# Patient Record
Sex: Male | Born: 1955 | Race: White | Hispanic: No | Marital: Single | Smoking: Former smoker
Health system: Southern US, Community
[De-identification: ages and names within clinical notes are randomized; demographics above are authoritative.]

## PROBLEM LIST (undated history)

## (undated) DIAGNOSIS — E039 Hypothyroidism, unspecified: Secondary | ICD-10-CM

## (undated) DIAGNOSIS — I739 Peripheral vascular disease, unspecified: Secondary | ICD-10-CM

## (undated) DIAGNOSIS — F04 Amnestic disorder due to known physiological condition: Secondary | ICD-10-CM

## (undated) DIAGNOSIS — M797 Fibromyalgia: Secondary | ICD-10-CM

## (undated) DIAGNOSIS — K7460 Unspecified cirrhosis of liver: Secondary | ICD-10-CM

## (undated) DIAGNOSIS — F329 Major depressive disorder, single episode, unspecified: Secondary | ICD-10-CM

## (undated) DIAGNOSIS — D696 Thrombocytopenia, unspecified: Secondary | ICD-10-CM

## (undated) DIAGNOSIS — N189 Chronic kidney disease, unspecified: Secondary | ICD-10-CM

## (undated) DIAGNOSIS — E1140 Type 2 diabetes mellitus with diabetic neuropathy, unspecified: Secondary | ICD-10-CM

## (undated) DIAGNOSIS — F32A Depression, unspecified: Secondary | ICD-10-CM

## (undated) DIAGNOSIS — F29 Unspecified psychosis not due to a substance or known physiological condition: Secondary | ICD-10-CM

## (undated) DIAGNOSIS — R569 Unspecified convulsions: Secondary | ICD-10-CM

## (undated) DIAGNOSIS — F1010 Alcohol abuse, uncomplicated: Secondary | ICD-10-CM

## (undated) DIAGNOSIS — F419 Anxiety disorder, unspecified: Secondary | ICD-10-CM

## (undated) DIAGNOSIS — M1990 Unspecified osteoarthritis, unspecified site: Secondary | ICD-10-CM

## (undated) DIAGNOSIS — D689 Coagulation defect, unspecified: Secondary | ICD-10-CM

## (undated) DIAGNOSIS — E669 Obesity, unspecified: Secondary | ICD-10-CM

## (undated) DIAGNOSIS — E871 Hypo-osmolality and hyponatremia: Secondary | ICD-10-CM

## (undated) DIAGNOSIS — I8500 Esophageal varices without bleeding: Secondary | ICD-10-CM

## (undated) DIAGNOSIS — K7682 Hepatic encephalopathy: Secondary | ICD-10-CM

## (undated) DIAGNOSIS — Z87898 Personal history of other specified conditions: Secondary | ICD-10-CM

## (undated) DIAGNOSIS — K219 Gastro-esophageal reflux disease without esophagitis: Secondary | ICD-10-CM

## (undated) DIAGNOSIS — F319 Bipolar disorder, unspecified: Secondary | ICD-10-CM

## (undated) DIAGNOSIS — B1920 Unspecified viral hepatitis C without hepatic coma: Secondary | ICD-10-CM

## (undated) DIAGNOSIS — K439 Ventral hernia without obstruction or gangrene: Secondary | ICD-10-CM

## (undated) DIAGNOSIS — IMO0002 Reserved for concepts with insufficient information to code with codable children: Secondary | ICD-10-CM

## (undated) DIAGNOSIS — S32030A Wedge compression fracture of third lumbar vertebra, initial encounter for closed fracture: Secondary | ICD-10-CM

## (undated) DIAGNOSIS — K7290 Hepatic failure, unspecified without coma: Secondary | ICD-10-CM

## (undated) DIAGNOSIS — Z87448 Personal history of other diseases of urinary system: Secondary | ICD-10-CM

## (undated) HISTORY — PX: OTHER SURGICAL HISTORY: SHX169

## (undated) HISTORY — DX: Type 2 diabetes mellitus with diabetic neuropathy, unspecified: E11.40

## (undated) HISTORY — DX: Reserved for concepts with insufficient information to code with codable children: IMO0002

## (undated) HISTORY — PX: COLOSTOMY: SHX63

## (undated) HISTORY — PX: CHOLECYSTECTOMY: SHX55

## (undated) HISTORY — DX: Unspecified osteoarthritis, unspecified site: M19.90

## (undated) HISTORY — DX: Chronic kidney disease, unspecified: N18.9

## (undated) HISTORY — DX: Unspecified convulsions: R56.9

---

## 2000-10-16 ENCOUNTER — Encounter: Payer: Self-pay | Admitting: Emergency Medicine

## 2000-10-16 ENCOUNTER — Emergency Department (HOSPITAL_COMMUNITY): Admission: EM | Admit: 2000-10-16 | Discharge: 2000-10-16 | Payer: Self-pay | Admitting: Emergency Medicine

## 2007-07-27 ENCOUNTER — Inpatient Hospital Stay (HOSPITAL_COMMUNITY): Admission: EM | Admit: 2007-07-27 | Discharge: 2007-08-05 | Payer: Self-pay | Admitting: Emergency Medicine

## 2007-10-30 ENCOUNTER — Ambulatory Visit: Payer: Self-pay | Admitting: Vascular Surgery

## 2007-10-30 ENCOUNTER — Inpatient Hospital Stay (HOSPITAL_COMMUNITY): Admission: EM | Admit: 2007-10-30 | Discharge: 2007-11-03 | Payer: Self-pay | Admitting: Emergency Medicine

## 2007-10-31 ENCOUNTER — Encounter (INDEPENDENT_AMBULATORY_CARE_PROVIDER_SITE_OTHER): Payer: Self-pay | Admitting: Internal Medicine

## 2009-05-26 ENCOUNTER — Emergency Department (HOSPITAL_COMMUNITY): Admission: EM | Admit: 2009-05-26 | Discharge: 2009-05-27 | Payer: Self-pay | Admitting: Emergency Medicine

## 2009-08-19 ENCOUNTER — Encounter (INDEPENDENT_AMBULATORY_CARE_PROVIDER_SITE_OTHER): Payer: Self-pay | Admitting: *Deleted

## 2009-10-04 ENCOUNTER — Ambulatory Visit: Payer: Self-pay | Admitting: Gastroenterology

## 2009-10-23 ENCOUNTER — Inpatient Hospital Stay (HOSPITAL_COMMUNITY): Admission: EM | Admit: 2009-10-23 | Discharge: 2009-10-27 | Payer: Self-pay | Admitting: Emergency Medicine

## 2009-12-01 ENCOUNTER — Encounter (INDEPENDENT_AMBULATORY_CARE_PROVIDER_SITE_OTHER): Payer: Self-pay | Admitting: *Deleted

## 2010-01-10 ENCOUNTER — Telehealth (INDEPENDENT_AMBULATORY_CARE_PROVIDER_SITE_OTHER): Payer: Self-pay | Admitting: *Deleted

## 2010-01-10 ENCOUNTER — Telehealth: Payer: Self-pay | Admitting: Gastroenterology

## 2010-02-26 DIAGNOSIS — K439 Ventral hernia without obstruction or gangrene: Secondary | ICD-10-CM

## 2010-02-26 DIAGNOSIS — Z87898 Personal history of other specified conditions: Secondary | ICD-10-CM

## 2010-02-26 HISTORY — DX: Personal history of other specified conditions: Z87.898

## 2010-02-26 HISTORY — DX: Ventral hernia without obstruction or gangrene: K43.9

## 2010-03-28 NOTE — Letter (Signed)
Summary: New Patient letter  [facility]  [address] [place], [place] [address]   Phone: [phone]  Fax: [phone]       08/19/2009 MRN: [id number]  Tony Boyd [address] [place],  [address]  Dear Mr. Tramontana,  Welcome to the Gastroenterology Division at [facility].    You are scheduled to see Dr.  Arlyce Dice  on 10/04/2009 at 9:00am on the 3rd floor at [facility], [address].  We ask that you try to arrive at our office 15 minutes prior to your appointment time to allow for check-in.  We would like you to complete the enclosed self-administered evaluation form prior to your visit and bring it with you on the day of your appointment.  We will review it with you.  Also, please bring a complete list of all your medications or, if you prefer, bring the medication bottles and we will list them.  Please bring your insurance card so that we may make a copy of it.  If your insurance requires a referral to see a specialist, please bring your referral form from your primary care physician.  Co-payments are due at the time of your visit and may be paid by cash, check or credit card.     Your office visit will consist of a consult with your physician (includes a physical exam), any laboratory testing he/she may order, scheduling of any necessary diagnostic testing (e.g. x-ray, ultrasound, CT-scan), and scheduling of a procedure (e.g. Endoscopy, Colonoscopy) if required.  Please allow enough time on your schedule to allow for any/all of these possibilities.    If you cannot keep your appointment, please call [phone] to cancel or reschedule prior to your appointment date.  This allows [place] the opportunity to schedule an appointment for another patient in need of care.  If you do not cancel or reschedule by 5 p.m. the business day prior to your appointment date, you will be charged a $50.00 late cancellation/no-show fee.    Thank you for choosing  [facility] for your medical needs.  We appreciate the opportunity to care for you.  Please visit [place] at our website  to learn more about our practice.                     Sincerely,                                                             The Gastroenterology Division

## 2010-03-28 NOTE — Progress Notes (Signed)
Summary: appt cancelled  Phone Note Outgoing Call Call back at [facility] Phone [phone]   Call placed by: Vallarie Mare,  January 10, 2010 1:54 PM Call placed to: Patient Summary of Call: left mesg on vm stating that 01/13/2010 appt has been cancelled due to appt history with Dr. Arlyce Dice... if any questions can call back at [phone]... then I cancelled the appt Initial call taken by: Vallarie Mare,  January 10, 2010 1:55 PM

## 2010-03-28 NOTE — Letter (Signed)
Summary: New Patient letter  [facility]  [address] [place], [place] [address]   Phone: [phone]  Fax: [phone]       12/01/2009 MRN: [id number]  Tony Boyd [address] [place], [place]  [address]  Dear Tony Boyd,  Welcome to the Gastroenterology Division at [facility].    You are scheduled to see Dr. Christella Hartigan on 01/13/2010 at 1:30PM on the 3rd floor at [facility], [address].  We ask that you try to arrive at our office 15 minutes prior to your appointment time to allow for check-in.  We would like you to complete the enclosed self-administered evaluation form prior to your visit and bring it with you on the day of your appointment.  We will review it with you.  Also, please bring a complete list of all your medications or, if you prefer, bring the medication bottles and we will list them.  Please bring your insurance card so that we may make a copy of it.  If your insurance requires a referral to see a specialist, please bring your referral form from your primary care physician.  Co-payments are due at the time of your visit and may be paid by cash, check or credit card.     Your office visit will consist of a consult with your physician (includes a physical exam), any laboratory testing he/she may order, scheduling of any necessary diagnostic testing (e.g. x-ray, ultrasound, CT-scan), and scheduling of a procedure (e.g. Endoscopy, Colonoscopy) if required.  Please allow enough time on your schedule to allow for any/all of these possibilities.    If you cannot keep your appointment, please call [phone] to cancel or reschedule prior to your appointment date.  This allows [place] the opportunity to schedule an appointment for another patient in need of care.  If you do not cancel or reschedule by 5 p.m. the business day prior to your appointment date, you will be charged a $50.00 late cancellation/no-show fee.    Thank you for choosing [facility] for your medical needs.  We appreciate the opportunity to care for you.  Please visit [place] at our website  to learn more about our practice.                     Sincerely,                                                             The Gastroenterology Division

## 2010-03-28 NOTE — Progress Notes (Signed)
Summary: Cx appt  ---- Converted from flag ---- ---- 01/10/2010 10:46 AM, Chales Abrahams CMA (AAMA) wrote:  Revonda Standard can you please cx this appt and let the pt know, Dr Christella Hartigan will not accept.  Thanks  ---- 01/09/2010 6:49 PM, Rachael Fee MD wrote: no, thanks  ---- 01/09/2010 10:16 AM, Chales Abrahams CMA Duncan Dull) wrote: Dr Christella Hartigan this pt no showed for Dr Arlyce Dice 2 times and they put him on your schedule.  Do you want to leave it on ? ------------------------------

## 2010-05-11 LAB — CBC
HCT: 35.6 % — ABNORMAL LOW (ref 39.0–52.0)
HCT: 31.1 % — ABNORMAL LOW (ref 39.0–52.0)
HCT: 33.9 % — ABNORMAL LOW (ref 39.0–52.0)
HCT: 34.4 % — ABNORMAL LOW (ref 39.0–52.0)
Hemoglobin: 12.1 g/dL — ABNORMAL LOW (ref 13.0–17.0)
Hemoglobin: 11.6 g/dL — ABNORMAL LOW (ref 13.0–17.0)
Hemoglobin: 11.8 g/dL — ABNORMAL LOW (ref 13.0–17.0)
MCH: 37.1 pg — ABNORMAL HIGH (ref 26.0–34.0)
MCH: 37.5 pg — ABNORMAL HIGH (ref 26.0–34.0)
MCH: 38.5 pg — ABNORMAL HIGH (ref 26.0–34.0)
MCHC: 34 g/dL (ref 30.0–36.0)
MCHC: 33.9 g/dL (ref 30.0–36.0)
MCHC: 34.3 g/dL (ref 30.0–36.0)
MCHC: 34.4 g/dL (ref 30.0–36.0)
MCV: 109.2 fL — ABNORMAL HIGH (ref 78.0–100.0)
MCV: 109.7 fL — ABNORMAL HIGH (ref 78.0–100.0)
MCV: 110.7 fL — ABNORMAL HIGH (ref 78.0–100.0)
Platelets: 42 10*3/uL — ABNORMAL LOW (ref 150–400)
Platelets: 43 10*3/uL — ABNORMAL LOW (ref 150–400)
RBC: 3.26 MIL/uL — ABNORMAL LOW (ref 4.22–5.81)
RBC: 3.09 MIL/uL — ABNORMAL LOW (ref 4.22–5.81)
RDW: 15.1 % (ref 11.5–15.5)
RDW: 15.2 % (ref 11.5–15.5)
RDW: 15.5 % (ref 11.5–15.5)
WBC: 4.3 10*3/uL (ref 4.0–10.5)
WBC: 4.5 10*3/uL (ref 4.0–10.5)
WBC: 9 10*3/uL (ref 4.0–10.5)

## 2010-05-11 LAB — DIFFERENTIAL
Basophils Absolute: 0 10*3/uL (ref 0.0–0.1)
Basophils Relative: 0 % (ref 0–1)
Eosinophils Absolute: 0.2 10*3/uL (ref 0.0–0.7)
Eosinophils Relative: 0 % (ref 0–5)
Lymphocytes Relative: 8 % — ABNORMAL LOW (ref 12–46)
Monocytes Absolute: 0.5 10*3/uL (ref 0.1–1.0)
Neutro Abs: 7.8 10*3/uL — ABNORMAL HIGH (ref 1.7–7.7)
Neutrophils Relative %: 59 % (ref 43–77)
Neutrophils Relative %: 87 % — ABNORMAL HIGH (ref 43–77)

## 2010-05-11 LAB — TROPONIN I: Troponin I: 0.02 ng/mL (ref 0.00–0.06)

## 2010-05-11 LAB — CULTURE, BLOOD (ROUTINE X 2): Culture: NO GROWTH

## 2010-05-11 LAB — HEPATIC FUNCTION PANEL
ALT: 29 U/L (ref 0–53)
AST: 80 U/L — ABNORMAL HIGH (ref 0–37)
Albumin: 2 g/dL — ABNORMAL LOW (ref 3.5–5.2)
Bilirubin, Direct: 1.6 mg/dL — ABNORMAL HIGH (ref 0.0–0.3)
Total Bilirubin: 4 mg/dL — ABNORMAL HIGH (ref 0.3–1.2)
Total Protein: 8 g/dL (ref 6.0–8.3)

## 2010-05-11 LAB — COMPREHENSIVE METABOLIC PANEL
ALT: 25 U/L (ref 0–53)
ALT: 26 U/L (ref 0–53)
AST: 68 U/L — ABNORMAL HIGH (ref 0–37)
Alkaline Phosphatase: 96 U/L (ref 39–117)
BUN: 9 mg/dL (ref 6–23)
CO2: 30 mEq/L (ref 19–32)
CO2: 27 mEq/L (ref 19–32)
CO2: 27 mEq/L (ref 19–32)
Calcium: 7.5 mg/dL — ABNORMAL LOW (ref 8.4–10.5)
Calcium: 7.7 mg/dL — ABNORMAL LOW (ref 8.4–10.5)
Chloride: 102 mEq/L (ref 96–112)
Creatinine, Ser: 0.75 mg/dL (ref 0.4–1.5)
GFR calc Af Amer: >60 mL/min (ref >60)
GFR calc Af Amer: >60 mL/min (ref >60)
GFR calc non Af Amer: >60 mL/min (ref >60)
GFR calc non Af Amer: >60 mL/min (ref >60)
Glucose, Bld: 117 mg/dL — ABNORMAL HIGH (ref 70–99)
Glucose, Bld: 139 mg/dL — ABNORMAL HIGH (ref 70–99)
Potassium: 3.7 mEq/L (ref 3.5–5.1)
Sodium: 131 mEq/L — ABNORMAL LOW (ref 135–145)
Sodium: 132 mEq/L — ABNORMAL LOW (ref 135–145)
Sodium: 132 mEq/L — ABNORMAL LOW (ref 135–145)
Total Bilirubin: 2.8 mg/dL — ABNORMAL HIGH (ref 0.3–1.2)
Total Protein: 7.9 g/dL (ref 6.0–8.3)
Total Protein: 6.6 g/dL (ref 6.0–8.3)

## 2010-05-11 LAB — URINE CULTURE: Colony Count: 2000

## 2010-05-11 LAB — MRSA PCR SCREENING: MRSA by PCR: NEGATIVE

## 2010-05-11 LAB — PHOSPHORUS: Phosphorus: 4.3 mg/dL (ref 2.3–4.6)

## 2010-05-11 LAB — URINALYSIS, ROUTINE W REFLEX MICROSCOPIC
Hgb urine dipstick: NEGATIVE
Urobilinogen, UA: 1 mg/dL (ref 0.0–1.0)
pH: 5 (ref 5.0–8.0)

## 2010-05-11 LAB — CK TOTAL AND CKMB (NOT AT ARMC)
CK, MB: 2.9 ng/mL (ref 0.3–4.0)
Relative Index: 1.2 (ref 0.0–2.5)
Total CK: 250 U/L — ABNORMAL HIGH (ref 7–232)

## 2010-05-11 LAB — POCT CARDIAC MARKERS: Troponin i, poc: <0.05 ng/mL (ref 0.00–0.09)

## 2010-05-11 LAB — POCT I-STAT, CHEM 8
Calcium, Ion: 0.9 mmol/L — ABNORMAL LOW (ref 1.12–1.32)
Potassium: 5.4 mEq/L — ABNORMAL HIGH (ref 3.5–5.1)
TCO2: 21 mmol/L (ref 0–100)

## 2010-05-11 LAB — GLUCOSE, CAPILLARY

## 2010-05-11 LAB — URINE MICROSCOPIC-ADD ON

## 2010-05-11 LAB — AMMONIA
Ammonia: 151 umol/L — ABNORMAL HIGH (ref 11–35)
Ammonia: 69 umol/L — ABNORMAL HIGH (ref 11–35)

## 2010-05-11 LAB — MAGNESIUM: Magnesium: 1.8 mg/dL (ref 1.5–2.5)

## 2010-05-11 LAB — LACTIC ACID, PLASMA: Lactic Acid, Venous: 3 mmol/L — ABNORMAL HIGH (ref 0.5–2.2)

## 2010-05-21 LAB — DIFFERENTIAL
Basophils Relative: 0 % (ref 0–1)
Eosinophils Relative: 6 % — ABNORMAL HIGH (ref 0–5)
Lymphocytes Relative: 28 % (ref 12–46)
Monocytes Absolute: 0.5 10*3/uL (ref 0.1–1.0)
Monocytes Relative: 11 % (ref 3–12)
Neutrophils Relative %: 55 % (ref 43–77)

## 2010-05-21 LAB — RAPID URINE DRUG SCREEN, HOSP PERFORMED
Benzodiazepines: POSITIVE — AB
Cocaine: NOT DETECTED
Opiates: NOT DETECTED
Tetrahydrocannabinol: NOT DETECTED

## 2010-05-21 LAB — URINALYSIS, ROUTINE W REFLEX MICROSCOPIC
Glucose, UA: NEGATIVE mg/dL
Hgb urine dipstick: NEGATIVE
Ketones, ur: 15 mg/dL — AB
Specific Gravity, Urine: 1.019 (ref 1.005–1.030)
pH: 6 (ref 5.0–8.0)

## 2010-05-21 LAB — POCT I-STAT, CHEM 8
BUN: <3 mg/dL — ABNORMAL LOW (ref 6–23)
Calcium, Ion: 0.99 mmol/L — ABNORMAL LOW (ref 1.12–1.32)
Creatinine, Ser: 1.1 mg/dL (ref 0.4–1.5)
Hemoglobin: 12.6 g/dL — ABNORMAL LOW (ref 13.0–17.0)
Sodium: 141 mEq/L (ref 135–145)
TCO2: 28 mmol/L (ref 0–100)

## 2010-05-21 LAB — CBC
MCV: 114.2 fL — ABNORMAL HIGH (ref 78.0–100.0)
RBC: 3.08 MIL/uL — ABNORMAL LOW (ref 4.22–5.81)
WBC: 4.4 10*3/uL (ref 4.0–10.5)

## 2010-05-21 LAB — HEPATIC FUNCTION PANEL
ALT: 29 U/L (ref 0–53)
AST: 90 U/L — ABNORMAL HIGH (ref 0–37)
Albumin: 1.8 g/dL — ABNORMAL LOW (ref 3.5–5.2)
Alkaline Phosphatase: 104 U/L (ref 39–117)
Total Protein: 8.2 g/dL (ref 6.0–8.3)

## 2010-05-21 LAB — ETHANOL: Alcohol, Ethyl (B): 277 mg/dL — ABNORMAL HIGH (ref 0–10)

## 2010-07-11 NOTE — Discharge Summary (Signed)
NAME:  Tony Boyd, Tony Boyd NO.:  [id number]   MEDICAL RECORD NO.:  [id number]          PATIENT TYPE:  INP   LOCATION:  1511                         FACILITY:  [facility]   PHYSICIAN:  Marcellus Scott, MD     DATE OF BIRTH:  [birth year]   DATE OF ADMISSION:  07/27/2007  DATE OF DISCHARGE:  08/01/2007                               DISCHARGE SUMMARY   PRIMARY MEDICAL DOCTOR:  Gentry Fitz.   DISCHARGE DIAGNOSES:  1. Delirium - resolved.  2. Hypokalemia - repleted.  3. Hypomagnesemia - repleted.  4. Mild hyponatremia, chronic.  5. End-stage liver disease/hepatitis C.  6. Coagulopathy secondary to problem # 5.  No bleeding.  7. Anemia - stable.  8. Chronic thrombocytopenia.  9. History of alcohol abuse.   CODE STATUS:  DNR.   DISCHARGE MEDICATIONS:  1. Roxanol 20 mg per mL solution, 10 mg p.o. q.6 h.  2. Lactulose 60 mL p.o. q.6 h.  3. Aldactone 50 mg p.o. daily.  4. Haldol 0.5 mg p.o. q.6 h.  5. Omeprazole 40 mg p.o. b.i.d.  6. Ativan 1 mg p.o. t.i.d.- for 1 week then taper by 1mg  per week to      zero.  7. Multivitamins with minerals 1 p.o. daily.  8. Thiamine 100 mg p.o. daily.  9. Roxanol 20 mg per mL solution, 5 mg p.o. q.2 h. p.r.n.  10.Ativan 1 mg p.o. q.8 h. p.r.n.  11.Mylanta 30-60 mL p.o. q.4 h. p.r.n.   DIAGNOSTICS:  1. On Jul 27, 2007 portable chest x-ray impression:  Patchy left      basilar opacity, may reflect atelectasis, although aspiration is      not excluded in this context.  There may be a small left pleural      effusion.  2. Ultrasound of the abdomen on July 28, 2007 impression:  No evidence      for significant ascites.  3. CT of the head without contrast impression:  No acute intracranial      process.   LABORATORY DATA:  Pertinent labs:  Magnesium 1.9.  Basic metabolic  panel:  Sodium 133 otherwise unremarkable.  BUN 3, creatinine 0.51.  CBCs with hemoglobin 10.7, hematocrit 32, white blood cell 4.5,  platelets 71, ammonia level 17,  lipase level  45, blood alcohol level  less than 5, INR of 1.9.   CONSULTATIONS:  Palliative: Dr. Karilyn Cota.   HOSPITAL COURSE/DISPOSITION:  Please refer to the history and physical  note for initial admission details.  In summary, Tony Boyd is a 55-year-  old gentleman with history of end-stage liver disease on home Hospice  who was brought from home with history of altered mental status and  agitation since the day before admission.  He has had 2 recent prolonged  admissions at [facility].  He then had ascites which was tapped.  He also had hematemesis and had been endoscoped by the  gastroenterologist.  Since then, he has been on Hospice.  Apparently he  had been improving, including his mental status,  for the week prior  until the day prior to this admission.  In the ED, he got Geodon.  He  complained of some abdominal discomfort.  He was evaluated in the  emergency room and admitted with altered mental status and  agitation/delirium.   1. Delirium/altered mental status.  There were multiple differential      diagnoses which were entertained including hepatic encephalopathy,      delirium tremens and medications.  He was admitted to the hospital.      CT of the head was negative.  He was continued on his lactulose.      His ammonia levels were normal.  Suspecting an infectious process,      the patient had a chest x-ray and paracentesis was requested to      rule out SBE.  His chest x-ray nor his presentation was suggestive      of pneumonia whereby his Avelox, which was empirically started, was      discontinued.  Interventional radiology indicated that there was no      fluid to tap in the belly.  The patient also was on benzodiazepines      high-dose which had been started since Jul 17, 2007 at [facility].  These are being slowly tapered.  The patient was placed      on Haldol.  With these measures, the patient has progressively done      quite well.  He is  now at most times coherent and able to hold a      descent conversation, but there are periods intermittently where he      seems confused and may be even confabulates.  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 Patient has not been alcohol      for a couple of weeks now.  2. Hypokalemia - repleted.  3. Hypomagnesemia - repleted.  4. End-stage liver disease/hepatitis C.  The patient has coagulopathy,      but is not bleeding.  He was on continuous vitamin K which I do not      see the need for and hence will be discontinued.  5. The patient actually had a palliative care consult done.  DNR was      already established.  They agreed with the Haldol and      benzodiazepine as needed.  They suggested brain imaging which was      done and a subdural hematoma was ruled out.  They indicated that at      the present level of functioning, he is not eligible for [facility], but continued to be eligible for Hospice.  In the meantime, OT evaluated the patient and recommended short-term  skilled nursing facility unless the patient had supervision at home.  I  have discussed his case with his daughter, Tamera Punt, who indicates that  she will not be able to provide the supervision that he needs at home  and is better that the patient goes to a skilled nursing facility which  the patient agrees to.  The patient at this point is stable and is  awaiting a bed at the skilled nursing facility.      Marcellus Scott, MD  Electronically Signed     AH/MEDQ  D:  08/01/2007  T:  08/01/2007  Job:  [id number]   cc:   Wilson Singer, M.D.  Fax: [phone]

## 2010-07-11 NOTE — Consult Note (Signed)
NAME:  Tony Boyd, Tony Boyd NO.:  [id number]   MEDICAL RECORD NO.:  [id number]          PATIENT TYPE:  INP   LOCATION:  1511                         FACILITY:  [facility]   PHYSICIAN:  Wilson Singer, M.D.DATE OF BIRTH:  December 31, 1955   DATE OF CONSULTATION:  07/28/2007  DATE OF DISCHARGE:                                 CONSULTATION   IMPRESSION:  1. Altered mental status - etiology unclear, likely multifactorial.      The patient has a history of end-stage alcoholic liver disease with      an INR of 1.9.  2. Palliative performance score of 40%.   RECOMMENDATIONS:  1. DNR already established.  2. Agree with Haldol and benzodiazepines as required.  3. Consider brain imaging for further diagnostic workup.  4. The patient is clearly hospice eligible and has been under hospice      care recently.   HISTORY:  This 55 year old man was admitted yesterday with altered  mental status.  He recently had spent time in [facility], where  he was admitted with pancreatitis and ascites and was found to have end-  stage liver disease and was put under hospice care.  He apparently had  been improving in the last week, but yesterday he became more agitated  and confused.  We are now asked to help in regarding symptom management.   PAST MEDICAL HISTORY:  1. End-stage liver disease as mentioned above, probably from alcohol      abuse.  2. History of hepatitis C.   ALLERGIES:  TORADOL.   SOCIAL HISTORY:  He is unable to give me any clear history, but from the  chart he has a history of alcohol abuse.   MEDICATIONS:  1. Haldol 0.65 mg every 6 hours.  2. Alcohol withdrawal protocol with Ativan.  3. Roxanol every 6 hours 5 mg.  4. Lactulose 60 mL every 6 hours.  5. Aldactone 50 mg daily.  6. Vitamin K 5 mg daily.  7. Avelox 400 mg daily.   FAMILY HISTORY:  Noncontributory.   PHYSICAL EXAMINATION:  VITAL SIGNS:  Temperature 98.8, blood pressure  105/72, pulse 105,  saturation 100% on room air.  CARDIOVASCULAR:  Heart  sounds present and normal.  RESPIRATORY:  Lung fields sound clear.  ABDOMEN:  Soft and nontender.  Does not clinically have ascites.  NEUROLOGICAL:  He is alert, but clearly confused and intermittently  drowsy.  He does move both his upper and lower limbs.  Plantars are  going down bilaterally.   INVESTIGATIONS:  INR is 1.9, hemoglobin 9.6, white blood cell count 5.0,  platelets 60, sodium 131, BUN 1, creatinine 0.55, glucose 101,  bicarbonate 22.  Alcohol level was less than 5.  Lipase was only 45.  Ammonia level was 17.   DISCUSSION:  This 55 year old who has end-stage liver disease has  altered mental status, the etiology of which is not entirely clear.  I  think further diagnostic workup with brain imaging would be of use to  rule out other things which are critical, such as subdural hematoma.  He  is clearly  eligible for hospice care and I agree with the current  treatment plan of using a haloperidol and benzodiazepine.      Wilson Singer, M.D.  Electronically Signed     NCG/MEDQ  D:  07/28/2007  T:  07/28/2007  Job:  [id number]
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
## 2010-07-11 NOTE — H&P (Signed)
NAME:  Tony Boyd, ESTIS NO.:  [id number]   MEDICAL RECORD NO.:  [id number]          PATIENT TYPE:  EMS   LOCATION:  ED                           FACILITY:  [facility]   PHYSICIAN:  Madaline Savage, MD        DATE OF BIRTH:  01/04/1956   DATE OF ADMISSION:  07/27/2007  DATE OF DISCHARGE:                              HISTORY & PHYSICAL   PRIMARY CARE PHYSICIAN:  [facility]; this patient is unassigned to  [place].   CHIEF COMPLAINT:  Altered mental status.   HISTORY OF PRESENT ILLNESS:  Mr. Mizrahi is a 55 year old gentleman with  end-stage liver disease who is on hospice at this time.  He is brought  from home with altered mental status with agitation since yesterday.  He  was apparently admitted at [facility] from June 25, 2007 to Jun 27, 2007 with pancreatitis and edema.  At that time he had his ascites  tapped there, and he was, as per the family, bedridden at that time.  Since then he had vomited blood and he had seen a gastroenterologist.  Since then he has been put on hospice and hospice has been following him  for the last week.  Apparently he was improving in the last 1 week and  his mental status had improved significantly.  He was almost at his  normal self yesterday.  Yesterday he suddenly started being more  agitated and was confused during the day.  He also had increased  abdominal swelling and lower extremity edema since yesterday.  After  coming to the emergency room he was given Geodon in the ER, which has  calmed him down.  At this time he states he has some abdominal  discomfort, but denies any pain.  He denies any shortness of breath or  any cough or fever.   PAST MEDICAL HISTORY:  1. End-stage liver disease.  2. History of hepatic nephropathy.  3. Chronic alcohol abuse.  He states he last took alcohol on Jul 11, 2007.  4. History of thrombocytopenia.  5. History of chronic hyponatremia.  6. History of Hepatitis C.   ALLERGIES:  He is  allergic to DROPERIDOL and TORADOL.   MEDICATIONS:  1. Lactulose 60 mL every 6 hours.  2. Ativan 3 mg every 6 hours.  3. Ativan 1-2 mg every 3 hours for breakthrough pain as needed.  4. Omeprazole 40 mg daily.  5. Oxygen 2-4 liters via nasal cannula.  6. Roxanol 20 mg per ampule 0.25 mg every 2-4 hours as needed.  7. Roxanol 20 mg per ampule 0.5 mL every 6 hours.  8. Spironolactone 50 mg daily.  9. Thiamine 100 mg daily.   SOCIAL HISTORY:  He has a history of heavy alcohol use in the past.  He  states he last took alcohol approximately 2 weeks ago.  He denies any  history of smoking or drug abuse.   FAMILY HISTORY:  Noncontributory.   REVIEW OF SYSTEMS:  GENERAL:  He denies any recent weight loss or weight  gain.  He denies any fever or chills.  HEENT.  No headaches, no blurred  vision, no sore throat.  CARDIOVASCULAR:  Denies chest pain,  palpitations.  RESPIRATORY:  No shortness of breath or cough.  GI:  He  does have some abdominal discomfort.  No nausea, vomiting, diarrhea or  constipation.   PHYSICAL EXAMINATION:  He is alert and oriented x1.  VITALS:  Temperature 97.2, pulse rate 118 per minute, respiratory rate  22 per minute, blood pressure 125/78, oxygen saturation 97% on room air.  HEENT:  Head atraumatic, normocephalic.  Pupils bilaterally equal to  light.  Mucous membranes appear moist.  NECK:  Supple.  No JVD, no carotid bruits.  CARDIOVASCULAR:  S1 and S2 heard.  Regular rate and rhythm.  CHEST:  Clear to auscultation.  ABDOMEN:  Distended.  There is a fluid thrill present.  EXTREMITIES:  Bilateral edema present.   LABS:  White count 74, hemoglobin 10.8, platelets 82.  Sodium 129,  potassium 3.2, chloride 96, bicarb 24, BUN 1, creatinine 0.64, glucose  109.  Lipase 45.  Alcohol level is less than 5.  INR is elevated at 1.9.  Troponin is less than 0.05.  CK-MB 1.1.  X-ray of the chest shows a  patchy left lower lobe opacity; may be atelectasis, cannot rule out   aspiration.   IMPRESSION:  1. Altered mental status with agitation.  Questionable delirium.  2. History of hepatic encephalopathy.  3. History of alcoholism, questionable DTs.  4. Questionable left lower lobe pneumonia.  5. End-stage liver disease.  6. DO NOT RESUSCITATE status   PLAN:  1. Altered Mental Status.  This is a 55 year old gentleman who has end-      stage liver disease, who was on hospice care.  He comes in with      altered mental status.  I have discussed his care with his      daughter.  As per the daughter, his primary goal of treatment would      be comfort.  At this time the etiology of the altered mental status      is not clear.  This could likely be hepatic encephalopathy or      delirium.  He also has stopped alcohol 2 weeks ago, and this could      be delirium tremens also.  At this time I will put him on the CR      protocol for alcohol withdrawal, and I will continue his lactulose.      The ammonia level at this time is pending.  We could use Haldol for      his delirium if needed.  2. Questionable Left Lower Lobe Pneumonia.  He does not complain of      any shortness breath or cough; but he did have altered mental      status and this could represent aspiration.  I will put him on      Avelox at this time and watch his clinical response.  3. End-stage liver disease.  Mr. Sitzmann has ascites at this time.  I      will get an ultrasound to see how big the ascites      is.  If it is large, I would favor tapping that ascites for      comfort.  4. DO NOT RESUSCITATE STATUS.  As per the family, the primary goal of      treatment should be comfort.  I will consult the palliative care  team for symptom management.  I would admit him to the palliative      care floor.      Madaline Savage, MD  Electronically Signed     PKN/MEDQ  D:  07/27/2007  T:  07/27/2007  Job:  [id number]

## 2010-07-11 NOTE — Discharge Summary (Signed)
NAME:  Tony Boyd, Tony Boyd NO.:  [id number]   MEDICAL RECORD NO.:  [id number]          PATIENT TYPE:  INP   LOCATION:  1511                         FACILITY:  [facility]   PHYSICIAN:  Hind I Elsaid, MD      DATE OF BIRTH:  [birth year]   DATE OF ADMISSION:  07/27/2007  DATE OF DISCHARGE:  08/05/2007                               DISCHARGE SUMMARY   PRIMARY CARE PHYSICIAN:  Unassigned.   DISCHARGE DIAGNOSES:  1. Delirium, resolved.  2. Hypokalemia, resolved.  3. Hypomagnesemia, resolved.  4. Mild hyponatremia, resolved.  5. End-stage liver disease, hepatitis C.  6. Hypoalbuminemia.  7. Coagulopathy secondary to problem #5.  8. Anemia.  9. Chronic thrombocytopenia.  10.History of alcohol abuse.   CODE STATUS:  DNR.   DISCHARGE MEDICATIONS:  Discharge medications remain the same.  Will add  Lasix 20 mg p.o. b.i.d.   HOSPITAL COURSE:  1. Delirium and altered mental status, which has completely resolved      at this time.  Vitamin B12, folic acid and liver plasma agent were      negative and the patient's symptoms completely resolved.   1. End-stage liver disease/hepatitis C.  The patient will continue      with Aldactone, lactulose and Lasix.   DISPOSITION:  The patient will be discharged to skilled nursing facility  with Hospice followup on consult at the skilled nursing facility.  We  feel that the patient is medically stable to be discharged to the  skilled nursing facility with Hospice.   For further hospital course, please review the discharge summary done on  August 01, 2007.      Hind Bosie Helper, MD  Electronically Signed     HIE/MEDQ  D:  08/05/2007  T:  08/05/2007  Job:  [id number]

## 2010-07-12 ENCOUNTER — Inpatient Hospital Stay (HOSPITAL_COMMUNITY)
Admission: EM | Admit: 2010-07-12 | Discharge: 2010-08-02 | DRG: 602 | Disposition: A | Discharge status: Skilled Nursing Facility | Payer: Medicare Other | Attending: Internal Medicine | Admitting: Internal Medicine

## 2010-07-12 ENCOUNTER — Emergency Department (HOSPITAL_COMMUNITY): Payer: Medicare Other

## 2010-07-12 DIAGNOSIS — Z79899 Other long term (current) drug therapy: Secondary | ICD-10-CM

## 2010-07-12 DIAGNOSIS — N179 Acute kidney failure, unspecified: Secondary | ICD-10-CM | POA: Diagnosis not present

## 2010-07-12 DIAGNOSIS — Z66 Do not resuscitate: Secondary | ICD-10-CM | POA: Diagnosis present

## 2010-07-12 DIAGNOSIS — L02219 Cutaneous abscess of trunk, unspecified: Principal | ICD-10-CM | POA: Diagnosis present

## 2010-07-12 DIAGNOSIS — I8500 Esophageal varices without bleeding: Secondary | ICD-10-CM | POA: Diagnosis present

## 2010-07-12 DIAGNOSIS — L03319 Cellulitis of trunk, unspecified: Principal | ICD-10-CM | POA: Diagnosis present

## 2010-07-12 DIAGNOSIS — E876 Hypokalemia: Secondary | ICD-10-CM | POA: Diagnosis not present

## 2010-07-12 DIAGNOSIS — D684 Acquired coagulation factor deficiency: Secondary | ICD-10-CM | POA: Diagnosis present

## 2010-07-12 DIAGNOSIS — E871 Hypo-osmolality and hyponatremia: Secondary | ICD-10-CM | POA: Diagnosis present

## 2010-07-12 DIAGNOSIS — E039 Hypothyroidism, unspecified: Secondary | ICD-10-CM | POA: Diagnosis present

## 2010-07-12 DIAGNOSIS — B1921 Unspecified viral hepatitis C with hepatic coma: Secondary | ICD-10-CM | POA: Diagnosis present

## 2010-07-12 DIAGNOSIS — Z87828 Personal history of other (healed) physical injury and trauma: Secondary | ICD-10-CM

## 2010-07-12 DIAGNOSIS — A498 Other bacterial infections of unspecified site: Secondary | ICD-10-CM | POA: Diagnosis present

## 2010-07-12 DIAGNOSIS — F29 Unspecified psychosis not due to a substance or known physiological condition: Secondary | ICD-10-CM | POA: Diagnosis present

## 2010-07-12 DIAGNOSIS — Z9049 Acquired absence of other specified parts of digestive tract: Secondary | ICD-10-CM

## 2010-07-12 DIAGNOSIS — R188 Other ascites: Secondary | ICD-10-CM | POA: Diagnosis present

## 2010-07-12 DIAGNOSIS — D509 Iron deficiency anemia, unspecified: Secondary | ICD-10-CM | POA: Diagnosis present

## 2010-07-12 DIAGNOSIS — K769 Liver disease, unspecified: Secondary | ICD-10-CM | POA: Diagnosis present

## 2010-07-12 DIAGNOSIS — D638 Anemia in other chronic diseases classified elsewhere: Secondary | ICD-10-CM | POA: Diagnosis present

## 2010-07-12 DIAGNOSIS — K439 Ventral hernia without obstruction or gangrene: Secondary | ICD-10-CM | POA: Diagnosis present

## 2010-07-12 DIAGNOSIS — K7030 Alcoholic cirrhosis of liver without ascites: Secondary | ICD-10-CM | POA: Diagnosis present

## 2010-07-12 DIAGNOSIS — F319 Bipolar disorder, unspecified: Secondary | ICD-10-CM | POA: Diagnosis present

## 2010-07-12 DIAGNOSIS — D6959 Other secondary thrombocytopenia: Secondary | ICD-10-CM | POA: Diagnosis present

## 2010-07-13 ENCOUNTER — Inpatient Hospital Stay (HOSPITAL_COMMUNITY): Payer: Medicare Other

## 2010-07-13 LAB — DIFFERENTIAL
Basophils Absolute: 0 10*3/uL (ref 0.0–0.1)
Basophils Relative: 0 % (ref 0–1)
Eosinophils Absolute: 0 10*3/uL (ref 0.0–0.7)
Monocytes Absolute: 1.1 10*3/uL — ABNORMAL HIGH (ref 0.1–1.0)
Monocytes Relative: 9 % (ref 3–12)
Neutro Abs: 10.7 10*3/uL — ABNORMAL HIGH (ref 1.7–7.7)
Neutrophils Relative %: 85 % — ABNORMAL HIGH (ref 43–77)

## 2010-07-13 LAB — URINALYSIS, ROUTINE W REFLEX MICROSCOPIC
Glucose, UA: NEGATIVE mg/dL
Hgb urine dipstick: NEGATIVE
Protein, ur: NEGATIVE mg/dL

## 2010-07-13 LAB — COMPREHENSIVE METABOLIC PANEL
AST: 57 U/L — ABNORMAL HIGH (ref 0–37)
Albumin: 2.1 g/dL — ABNORMAL LOW (ref 3.5–5.2)
Alkaline Phosphatase: 134 U/L — ABNORMAL HIGH (ref 39–117)
Chloride: 91 mEq/L — ABNORMAL LOW (ref 96–112)
Creatinine, Ser: 0.62 mg/dL (ref 0.4–1.5)
GFR calc Af Amer: >60 mL/min (ref >60)
Potassium: 4.5 mEq/L (ref 3.5–5.1)
Total Bilirubin: 4.1 mg/dL — ABNORMAL HIGH (ref 0.3–1.2)

## 2010-07-13 LAB — CBC
HCT: 31.8 % — ABNORMAL LOW (ref 39.0–52.0)
Hemoglobin: 10.9 g/dL — ABNORMAL LOW (ref 13.0–17.0)
Hemoglobin: 12.2 g/dL — ABNORMAL LOW (ref 13.0–17.0)
MCH: 36.2 pg — ABNORMAL HIGH (ref 26.0–34.0)
MCHC: 34.3 g/dL (ref 30.0–36.0)
MCHC: 34.9 g/dL (ref 30.0–36.0)
RBC: 3.04 MIL/uL — ABNORMAL LOW (ref 4.22–5.81)

## 2010-07-13 LAB — PROCALCITONIN: Procalcitonin: 3.03 ng/mL

## 2010-07-13 LAB — BASIC METABOLIC PANEL
CO2: 21 mEq/L (ref 19–32)
Chloride: 95 mEq/L — ABNORMAL LOW (ref 96–112)
Glucose, Bld: 157 mg/dL — ABNORMAL HIGH (ref 70–99)
Potassium: 4.5 mEq/L (ref 3.5–5.1)
Sodium: 120 mEq/L — ABNORMAL LOW (ref 135–145)

## 2010-07-13 LAB — AMMONIA: Ammonia: 33 umol/L (ref 11–60)

## 2010-07-14 LAB — CBC
Platelets: 25 10*3/uL — CL (ref 150–400)
RDW: 14.9 % (ref 11.5–15.5)
WBC: 7.5 10*3/uL (ref 4.0–10.5)

## 2010-07-14 LAB — URINE CULTURE
Colony Count: NO GROWTH
Culture  Setup Time: 201205170428
Culture: NO GROWTH

## 2010-07-14 LAB — COMPREHENSIVE METABOLIC PANEL
ALT: 32 U/L (ref 0–53)
Albumin: 1.7 g/dL — ABNORMAL LOW (ref 3.5–5.2)
Alkaline Phosphatase: 114 U/L (ref 39–117)
Calcium: 7.7 mg/dL — ABNORMAL LOW (ref 8.4–10.5)
Potassium: 4.2 mEq/L (ref 3.5–5.1)
Sodium: 123 mEq/L — ABNORMAL LOW (ref 135–145)
Total Protein: 6.1 g/dL (ref 6.0–8.3)

## 2010-07-15 LAB — COMPREHENSIVE METABOLIC PANEL
AST: 56 U/L — ABNORMAL HIGH (ref 0–37)
BUN: 13 mg/dL (ref 6–23)
CO2: 24 mEq/L (ref 19–32)
Calcium: 8 mg/dL — ABNORMAL LOW (ref 8.4–10.5)
Chloride: 92 mEq/L — ABNORMAL LOW (ref 96–112)
Creatinine, Ser: 0.53 mg/dL (ref 0.4–1.5)
GFR calc Af Amer: >60 mL/min (ref >60)
GFR calc non Af Amer: >60 mL/min (ref >60)
Total Bilirubin: 2.5 mg/dL — ABNORMAL HIGH (ref 0.3–1.2)

## 2010-07-15 LAB — CBC
Hemoglobin: 11.1 g/dL — ABNORMAL LOW (ref 13.0–17.0)
MCH: 36.2 pg — ABNORMAL HIGH (ref 26.0–34.0)
MCHC: 35.1 g/dL (ref 30.0–36.0)
MCV: 102.9 fL — ABNORMAL HIGH (ref 78.0–100.0)

## 2010-07-15 LAB — PROTIME-INR
INR: 2.64 — ABNORMAL HIGH (ref 0.00–1.49)
Prothrombin Time: 28.3 seconds — ABNORMAL HIGH (ref 11.6–15.2)

## 2010-07-16 LAB — PROTIME-INR
INR: 2.51 — ABNORMAL HIGH (ref 0.00–1.49)
Prothrombin Time: 27.2 seconds — ABNORMAL HIGH (ref 11.6–15.2)

## 2010-07-16 LAB — COMPREHENSIVE METABOLIC PANEL
ALT: 36 U/L (ref 0–53)
AST: 54 U/L — ABNORMAL HIGH (ref 0–37)
CO2: 24 mEq/L (ref 19–32)
Calcium: 8 mg/dL — ABNORMAL LOW (ref 8.4–10.5)
Creatinine, Ser: 0.69 mg/dL (ref 0.4–1.5)
GFR calc Af Amer: >60 mL/min (ref >60)
GFR calc non Af Amer: >60 mL/min (ref >60)
Sodium: 123 mEq/L — ABNORMAL LOW (ref 135–145)
Total Protein: 6.2 g/dL (ref 6.0–8.3)

## 2010-07-16 LAB — CBC
MCV: 103.2 fL — ABNORMAL HIGH (ref 78.0–100.0)
Platelets: 26 10*3/uL — CL (ref 150–400)
RBC: 3.16 MIL/uL — ABNORMAL LOW (ref 4.22–5.81)
RDW: 14.6 % (ref 11.5–15.5)
WBC: 9.2 10*3/uL (ref 4.0–10.5)

## 2010-07-17 ENCOUNTER — Inpatient Hospital Stay (HOSPITAL_COMMUNITY): Payer: Medicare Other

## 2010-07-17 LAB — AMMONIA: Ammonia: 35 umol/L (ref 11–60)

## 2010-07-18 ENCOUNTER — Inpatient Hospital Stay (HOSPITAL_COMMUNITY): Payer: Medicare Other

## 2010-07-18 LAB — BASIC METABOLIC PANEL
Calcium: 8.4 mg/dL (ref 8.4–10.5)
Sodium: 131 mEq/L — ABNORMAL LOW (ref 135–145)

## 2010-07-18 LAB — CBC
Hemoglobin: 12.5 g/dL — ABNORMAL LOW (ref 13.0–17.0)
MCHC: 35.6 g/dL (ref 30.0–36.0)
RDW: 14.6 % (ref 11.5–15.5)

## 2010-07-18 LAB — TYPE AND SCREEN
ABO/RH(D): A POS
Antibody Screen: NEGATIVE

## 2010-07-18 LAB — VANCOMYCIN, TROUGH: Vancomycin Tr: 22.9 ug/mL — ABNORMAL HIGH (ref 10.0–20.0)

## 2010-07-19 LAB — BASIC METABOLIC PANEL
BUN: 16 mg/dL (ref 6–23)
Chloride: 103 mEq/L (ref 96–112)
Glucose, Bld: 102 mg/dL — ABNORMAL HIGH (ref 70–99)
Potassium: 3.4 mEq/L — ABNORMAL LOW (ref 3.5–5.1)
Sodium: 137 mEq/L (ref 135–145)

## 2010-07-19 LAB — CULTURE, BLOOD (ROUTINE X 2)
Culture  Setup Time: 201205170925
Culture: NO GROWTH
Culture: NO GROWTH

## 2010-07-19 LAB — PROTIME-INR: Prothrombin Time: 23.2 seconds — ABNORMAL HIGH (ref 11.6–15.2)

## 2010-07-19 LAB — CBC
HCT: 30.4 % — ABNORMAL LOW (ref 39.0–52.0)
MCV: 104.5 fL — ABNORMAL HIGH (ref 78.0–100.0)
Platelets: 50 10*3/uL — ABNORMAL LOW (ref 150–400)
RBC: 2.91 MIL/uL — ABNORMAL LOW (ref 4.22–5.81)
WBC: 6.4 10*3/uL (ref 4.0–10.5)

## 2010-07-20 ENCOUNTER — Inpatient Hospital Stay (HOSPITAL_COMMUNITY): Payer: Medicare Other

## 2010-07-20 LAB — PREPARE FRESH FROZEN PLASMA: Unit division: 0

## 2010-07-20 LAB — CBC
Hemoglobin: 10.7 g/dL — ABNORMAL LOW (ref 13.0–17.0)
MCH: 35.8 pg — ABNORMAL HIGH (ref 26.0–34.0)
RBC: 2.99 MIL/uL — ABNORMAL LOW (ref 4.22–5.81)
WBC: 8 10*3/uL (ref 4.0–10.5)

## 2010-07-21 HISTORY — PX: OTHER SURGICAL HISTORY: SHX169

## 2010-07-21 LAB — BASIC METABOLIC PANEL
BUN: 18 mg/dL (ref 6–23)
Creatinine, Ser: 1.4 mg/dL (ref 0.4–1.5)
GFR calc non Af Amer: 53 mL/min — ABNORMAL LOW (ref >60)
Glucose, Bld: 99 mg/dL (ref 70–99)

## 2010-07-21 LAB — CBC
MCH: 35.9 pg — ABNORMAL HIGH (ref 26.0–34.0)
MCHC: 33.9 g/dL (ref 30.0–36.0)
Platelets: 40 10*3/uL — ABNORMAL LOW (ref 150–400)
RDW: 14.8 % (ref 11.5–15.5)

## 2010-07-21 LAB — PREPARE FRESH FROZEN PLASMA: Unit division: 0

## 2010-07-21 LAB — PROTIME-INR
INR: 1.93 — ABNORMAL HIGH (ref 0.00–1.49)
Prothrombin Time: 22.2 seconds — ABNORMAL HIGH (ref 11.6–15.2)

## 2010-07-21 LAB — VANCOMYCIN, TROUGH: Vancomycin Tr: 46.5 ug/mL (ref 10.0–20.0)
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
## 2010-07-22 LAB — BASIC METABOLIC PANEL
CO2: 28 mEq/L (ref 19–32)
Calcium: 7.4 mg/dL — ABNORMAL LOW (ref 8.4–10.5)
Creatinine, Ser: 1.59 mg/dL — ABNORMAL HIGH (ref 0.4–1.5)
GFR calc Af Amer: 55 mL/min — ABNORMAL LOW (ref >60)
GFR calc non Af Amer: 45 mL/min — ABNORMAL LOW (ref >60)
Glucose, Bld: 166 mg/dL — ABNORMAL HIGH (ref 70–99)
Sodium: 132 mEq/L — ABNORMAL LOW (ref 135–145)

## 2010-07-22 LAB — CBC
Hemoglobin: 9.9 g/dL — ABNORMAL LOW (ref 13.0–17.0)
MCH: 36.3 pg — ABNORMAL HIGH (ref 26.0–34.0)
MCHC: 34.1 g/dL (ref 30.0–36.0)
RDW: 14.8 % (ref 11.5–15.5)

## 2010-07-22 LAB — CULTURE, BLOOD (ROUTINE X 2)
Culture  Setup Time: 201205201757
Culture  Setup Time: 201205201757
Culture: NO GROWTH

## 2010-07-22 LAB — CULTURE, ROUTINE-ABSCESS

## 2010-07-22 LAB — BILIRUBIN, TOTAL: Total Bilirubin: 2.9 mg/dL — ABNORMAL HIGH (ref 0.3–1.2)

## 2010-07-23 LAB — BASIC METABOLIC PANEL
BUN: 20 mg/dL (ref 6–23)
Chloride: 102 mEq/L (ref 96–112)
GFR calc non Af Amer: 42 mL/min — ABNORMAL LOW (ref >60)
Glucose, Bld: 111 mg/dL — ABNORMAL HIGH (ref 70–99)
Potassium: 3.7 mEq/L (ref 3.5–5.1)
Sodium: 135 mEq/L (ref 135–145)

## 2010-07-23 LAB — CBC
HCT: 30.8 % — ABNORMAL LOW (ref 39.0–52.0)
Hemoglobin: 10.6 g/dL — ABNORMAL LOW (ref 13.0–17.0)
MCV: 105.8 fL — ABNORMAL HIGH (ref 78.0–100.0)
RBC: 2.91 MIL/uL — ABNORMAL LOW (ref 4.22–5.81)
RDW: 14.8 % (ref 11.5–15.5)
WBC: 9.2 10*3/uL (ref 4.0–10.5)

## 2010-07-24 LAB — BASIC METABOLIC PANEL
BUN: 22 mg/dL (ref 6–23)
CO2: 28 mEq/L (ref 19–32)
Calcium: 7.4 mg/dL — ABNORMAL LOW (ref 8.4–10.5)
Glucose, Bld: 122 mg/dL — ABNORMAL HIGH (ref 70–99)
Potassium: 3.5 mEq/L (ref 3.5–5.1)
Sodium: 136 mEq/L (ref 135–145)

## 2010-07-24 LAB — CBC
HCT: 28.5 % — ABNORMAL LOW (ref 39.0–52.0)
Hemoglobin: 9.8 g/dL — ABNORMAL LOW (ref 13.0–17.0)
MCHC: 34.4 g/dL (ref 30.0–36.0)
MCV: 106.7 fL — ABNORMAL HIGH (ref 78.0–100.0)

## 2010-07-25 LAB — CBC
HCT: 27.6 % — ABNORMAL LOW (ref 39.0–52.0)
Hemoglobin: 9.4 g/dL — ABNORMAL LOW (ref 13.0–17.0)
MCH: 36.3 pg — ABNORMAL HIGH (ref 26.0–34.0)
MCHC: 34.1 g/dL (ref 30.0–36.0)
RDW: 15.1 % (ref 11.5–15.5)

## 2010-07-25 LAB — BASIC METABOLIC PANEL
CO2: 28 mEq/L (ref 19–32)
Calcium: 7.5 mg/dL — ABNORMAL LOW (ref 8.4–10.5)
Creatinine, Ser: 1.52 mg/dL — ABNORMAL HIGH (ref 0.4–1.5)
GFR calc non Af Amer: 48 mL/min — ABNORMAL LOW (ref >60)
Glucose, Bld: 123 mg/dL — ABNORMAL HIGH (ref 70–99)

## 2010-07-26 ENCOUNTER — Inpatient Hospital Stay (HOSPITAL_COMMUNITY): Payer: Medicare Other

## 2010-07-26 LAB — CBC
MCH: 36 pg — ABNORMAL HIGH (ref 26.0–34.0)
MCHC: 33.7 g/dL (ref 30.0–36.0)
Platelets: 47 10*3/uL — ABNORMAL LOW (ref 150–400)
RDW: 14.9 % (ref 11.5–15.5)

## 2010-07-26 LAB — COMPREHENSIVE METABOLIC PANEL
ALT: 19 U/L (ref 0–53)
BUN: 22 mg/dL (ref 6–23)
CO2: 28 mEq/L (ref 19–32)
Calcium: 7.7 mg/dL — ABNORMAL LOW (ref 8.4–10.5)
Creatinine, Ser: 1.6 mg/dL — ABNORMAL HIGH (ref 0.4–1.5)
GFR calc non Af Amer: 45 mL/min — ABNORMAL LOW (ref >60)
Glucose, Bld: 135 mg/dL — ABNORMAL HIGH (ref 70–99)

## 2010-07-26 LAB — PROTIME-INR: Prothrombin Time: 24.7 seconds — ABNORMAL HIGH (ref 11.6–15.2)
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
## 2010-07-27 ENCOUNTER — Inpatient Hospital Stay (HOSPITAL_COMMUNITY): Payer: Medicare Other

## 2010-07-27 LAB — CBC
HCT: 25.4 % — ABNORMAL LOW (ref 39.0–52.0)
Hemoglobin: 8.7 g/dL — ABNORMAL LOW (ref 13.0–17.0)
RBC: 2.39 MIL/uL — ABNORMAL LOW (ref 4.22–5.81)
RDW: 14.8 % (ref 11.5–15.5)
WBC: 6.8 10*3/uL (ref 4.0–10.5)

## 2010-07-27 LAB — BASIC METABOLIC PANEL
GFR calc non Af Amer: 40 mL/min — ABNORMAL LOW (ref >60)
Glucose, Bld: 151 mg/dL — ABNORMAL HIGH (ref 70–99)
Potassium: 3.5 mEq/L (ref 3.5–5.1)
Sodium: 131 mEq/L — ABNORMAL LOW (ref 135–145)

## 2010-07-27 LAB — PROTIME-INR: Prothrombin Time: 23.8 seconds — ABNORMAL HIGH (ref 11.6–15.2)

## 2010-07-27 MED ORDER — IOHEXOL 300 MG/ML  SOLN: 7.0000 mL | Freq: Once | INTRAMUSCULAR | Status: AC | PRN
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
## 2010-07-28 LAB — AMMONIA: Ammonia: 61 umol/L — ABNORMAL HIGH (ref 11–60)

## 2010-07-29 LAB — COMPREHENSIVE METABOLIC PANEL
ALT: 18 U/L (ref 0–53)
AST: 32 U/L (ref 0–37)
Albumin: 1.5 g/dL — ABNORMAL LOW (ref 3.5–5.2)
Alkaline Phosphatase: 96 U/L (ref 39–117)
Potassium: 3.8 mEq/L (ref 3.5–5.1)
Sodium: 134 mEq/L — ABNORMAL LOW (ref 135–145)
Total Protein: 6.2 g/dL (ref 6.0–8.3)

## 2010-07-29 LAB — CBC
HCT: 24.1 % — ABNORMAL LOW (ref 39.0–52.0)
Platelets: 38 10*3/uL — ABNORMAL LOW (ref 150–400)
RDW: 14.9 % (ref 11.5–15.5)
WBC: 4.9 10*3/uL (ref 4.0–10.5)

## 2010-07-31 LAB — BASIC METABOLIC PANEL
BUN: 20 mg/dL (ref 6–23)
CO2: 25 mEq/L (ref 19–32)
GFR calc non Af Amer: 46 mL/min — ABNORMAL LOW (ref >60)
Glucose, Bld: 121 mg/dL — ABNORMAL HIGH (ref 70–99)
Potassium: 3.8 mEq/L (ref 3.5–5.1)
Sodium: 136 mEq/L (ref 135–145)

## 2010-07-31 LAB — CBC
HCT: 25.5 % — ABNORMAL LOW (ref 39.0–52.0)
Hemoglobin: 8.7 g/dL — ABNORMAL LOW (ref 13.0–17.0)
MCH: 36.4 pg — ABNORMAL HIGH (ref 26.0–34.0)
MCHC: 34.1 g/dL (ref 30.0–36.0)

## 2010-08-14 NOTE — Discharge Summary (Signed)
NAME:  Tony Boyd, Tony Boyd NO.:  [id number]  MEDICAL RECORD NO.:  [id number]  LOCATION:  1330                         FACILITY:  [facility]  PHYSICIAN:  Marinda Elk, M.D.DATE OF BIRTH:  10-19-1955  DATE OF ADMISSION:  07/12/2010 DATE OF DISCHARGE:  08/02/2010                         DISCHARGE SUMMARY-REFERRING   PRIMARY CARE DOCTOR:  Dr. Florentina Jenny.  DISCHARGE DIAGNOSES: 1. Abdominal accesses, status post percutaneous drain and drain     removed. 2. End-stage liver disease with cirrhosis secondary to hepatitis C and     alcohol.  Child-Pugh Class 3. 3. Thrombocytopenia. 4. Bipolar disorder. 5. Anemia. 6. Ascites. 7. Esophageal varices.  DISCHARGE MEDICATIONS: 1. Augmentin 500 one tablet b.i.d. 2. Spironolactone 200 mg daily. 3. Lasix 80 mg daily. 4. Alprazolam 1 tablet as needed. 5. Ensure 337 mL every morning. 6. Ferrous sulfate 325 mg at bedtime. 7. Folic acid 1 tablet daily. 8. Ibuprofen 200 mg q.6 h. 9. Lactulose 50 mL p.o. 5 times daily. 10.Synthroid 75 mcg daily. 11.Multivitamin 1 tablet daily. 12.Omeprazole 20 mg daily. 13.Oxycodone 5 mg q.4 h. p.r.n. 14.Risperdal 0.5 mg b.i.d. 15.Vitamin B over-the-counter 1 tablet daily. 16.Vitamin B12 once every morning. 17.Vitamin D over-the-counter 1 tablet every morning.  PROCEDURES PERFORMED: 1. Status post percutaneous draining, removed. 2. Fistulogram shows abscesses cavity decompression, there is no     evidence of fistula. 3. CT scan of the abdomen and pelvis, interval complete drain of fluid     on Jul 26, 2010, note showed interval complete drainage of the     fluid collection abscess in the anterior abdominal wall, multiple     anterior abdominal wall hernias as previously identified.  Hepatic     cyanosis.  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 4. Status post drain, successfully placed. 5. CT scan of the abdomen and pelvis on Jul 18, 1010 showed multiple     ventral hernias,  8 cm fluid collection when the liver antral hernia     demonstrate some peripheral inflammatory changes in her ribs and     focal abscess, extensive gastric varices.  Scattered abdominal     ascites and cirrhosis. 6. Abdominal ultrasound showed approximately 8 cm loculated fluid     collection consistent with abscess either in deep subcutaneous     tissue or anterior peritoneal space. 7. On Jul 13, 2010, showed negative ultrasound for ascites. 8. Chest x-ray showed no acute cardiopulmonary disease.  BRIEF ADMITTING HISTORY AND PHYSICAL:  Please refer to dictation from Jul 13, 2010 for further details.  Please refer to dictation from August 01, 2010, for further details.  BRIEF HOSPITAL COURSE: 1. Abdominal abscess in the abdominal wall.  The patient was found to     have an abdominal cellulitis.  Ultrasound showed results above.     [facility] was consulted.  They confirmed this abscess of the     abdominal wall with the CT scan.  The patient underwent     percutaneous drainage by IR.  The drain was placed on Jul 21, 2010.  He was continued on cefotaxime, culture revealed E-coli sensitive     to cefotaxime.  He was changed to Augmentin p.o.  Repeat CT scan     with IV contrast on May 30, show interval resolution of abscess.     His creatinine remained stable at 1.5.  Fistulogram was done after     that showed results above.  We will continue antibiotics for seven     more days. 2. End-stage renal disease with coagulopathy and cirrhosis secondary     to hepatitis C and alcohol.  Child-Pugh Class 3.  He was continued     on his medication.  He has a little bit of thrombocytopenia.     Abdominal ultrasound showed minimal ascites.  His abdomen was     distended but he is not symptomatic with no dyspnea or abdominal     pain.  His Lasix and spironolactone were increased and he will get     a BMET tomorrow. 3. Acute renal failure on admission.  At that time, the Lasix was     stopped.   His creatinine improved.  He was restarted and tolerating     it well.  His Aldactone was resumed and these were increased.  Due     to his massive fluid overload, he will be followed up at the     nursing home with an initial BMET and titrate diuretics as needed. 4. Confusion secondary to his lactulose.  Probably secondary to     hepatic encephalopathy.  He was continued his lactulose, this did     resolve. 5. Hypothyroidism.  No change were made.  Continue Synthroid. 6. Bipolar disorder.  Continue his Risperdal, no changes were made.  Vitals on the day of discharge show that his temperature 98, pulse of 83, respirations 16, blood pressure 122/62, and he is satting 95% on room air.  Labs on the day of discharge showed none.  DISPOSITION:  The patient will get a BMET tomorrow.  We will titrate his diuretics as needed.  He is massively overload.  He relates as about the same.  And to follow up on his potassium and his creatinine.  And continue BMETs every fourth day or weekly.  To titrate this diuretics as needed.    Marinda Elk, M.D.    AF/MEDQ  D:  08/02/2010  T:  08/02/2010  Job:  [id number]  cc:   Dr. Fabian November  Electronically Signed by Marinda Elk M.D. on 08/14/2010 10:56:24 AM

## 2010-08-21 NOTE — Discharge Summary (Signed)
NAME:  Tony Boyd, Tony Boyd NO.:  [id number]  MEDICAL RECORD NO.:  [id number]  LOCATION:  1330                         FACILITY:  [facility]  PHYSICIAN:  Kathlen Mody, MD       DATE OF BIRTH:  Nov 24, 1955  DATE OF ADMISSION:  07/12/2010 DATE OF DISCHARGE:                              DISCHARGE SUMMARY   PRIMARY CARE PHYSICIAN:  Dr. Florentina Jenny  CURRENT DIAGNOSES: 1. Abdominal abscess, status post percutaneous drain, removed and     resolved. 2. End-stage liver disease with coagulopathy. 3. Thrombocytopenia. 4. Bipolar disorder. 5. Anemia. 6. Ascites. 7. Hypothyroidism. 8. Esophageal varices. 9. Hypokalemia.  CONSULTS: 1. [facility]. 2. Interventional Radiology.  BRIEF HISTORY OF PRESENT ILLNESS:  Tony Boyd is a 55 year old gentleman with a history of end-stage liver disease, hepatitis C, anemia, hypothyroidism, presented to ER with fever and increasing abdominal pain for a few days and he was admitted for evaluation of abdominal wall cellulitis and possible abdominal abscess. 1. Fever/abdominal pain/abdominal abscess.  The patient was found to     have abdominal cellulitis with an ultrasound of the abdomen done on     Jul 18, 2010, showed 6 cm loculated fluid collection consistent     with abscess, [facility] was consulted and a repeat     CT abdomen and pelvis done on Jul 18, 2010, confirmed 8 cm fluid     collection representing a focal abscess.  The patient underwent a     percutaneous drain with aspiration and he was put on IV     antibiotics.  Percutaneous drain was placed on Jul 21, 2010, and he     was on cefoxitin for IV antibiotics.  Abscess cultures revealed E     coli, sensitive to cefoxitin.  The patient had a repeat CT abdomen     with IV contrast on Jul 26, 2010, which showed interval complete     drainage of the fluid collection/abscess of the anterior abdominal     wall hernia.  As his creatinine was elevated at 1.6  to 1.7, the CT     that was obtained was without contrast, so [facility]     Surgery, Dr. Abbey Chatters, recommended a drain injection study, which     showed decompressed cavity and no fistula was present and later on     the drain was removed on Jul 27, 2010, and [facility]     recommended to continue antibiotics for 1 more week and have signed     off. 2. End-stage liver disease with thrombocytopenia and ascites.     Abdominal ultrasound on Jul 13, 2010, was negative for ascites.     The patient's abdomen is still distended, but it is not     symptomatic, has no dyspnea, has no more abdominal pain. 3. Acute renal insufficiency.  The patient's creatinine on admission     was 1.69, 1.7.  At this time, Lasix was on hold.  Creatinine     improved to 1.56, but then the patient had worsening of lower     extremity edema, so a small dose Lasix was put in and he  was     continued on Lasix, Aldactone has been continued from the day of     admission. 4. Confusion.  The patient has end-stage liver disease.  He had some     component of hepatic encephalopathy.  He is on lactulose 50 mL 5     times a day. 5. Hypothyroidism.  He is on Synthroid, we will continue the same. 6. Anemia.  Combination of anemia of chronic disease with iron     deficiency anemia.  He is on iron supplements at this time. 7. Bipolar disorder.  Continue with the patient's risperidone 0.5 mg     b.i.d.  PERTINENT LABORATORY DATA:  On the day of discharge, the patient's basic metabolic panel significant for a glucose of 121, creatinine of 1.58, which has improved from creatinine of 1.69 and 1.7.  His recent ammonia level is 51 on July 30, 2010. CBC significant for a hemoglobin of 8.7, hematocrit of 25.5, platelets of 38, his platelets at baseline are 38. C diff PCR negative.  His albumin level is 1.8.  Blood cultures no growth for 5 days.  Culture of the abscess growing E coli, sensitive to cefazolin and  ceftazidime and ceftriaxone and ciprofloxacin.  RADIOLOGY:  On admission, the patient had a chest x-ray, which showed no acute cardiopulmonary disease, an abdominal ultrasound, negative for ascites, and a CT abdomen and pelvis without contrast shows an 8 cm fluid collection within the 3rd ventral hernia demonstrating peripheral inflammatory change and representing a focal abscess, extensive gastric varices with tortuous vessels extending into the  multiple ventral hernias.  Scattered abdominal ascites and diffuse subcutaneous edema, suggesting anasarca, hepatic cirrhosis.  The patient had a repeat CT abdomen on Jul 26, 2010, shows interval complete drainage of the fluid collection/abscess in the anterior abdominal wall hernia, multiple anterior abdominal wall hernias as described before each containing viruses.  PHYSICAL EXAMINATION:  VITAL SIGNS:  Temperature of 98.6, pulse of 70, respirations 20, blood pressure 104/58, saturating 95% to 97% on room air. GENERAL:  He is alert to, he is afebrile, he is confused, mental status at baseline. CARDIOVASCULAR:  S1, S2 heard. RESPIRATORY:  Decreased bilaterally. ABDOMEN:  Distended, no tenderness.  Bowel sounds are heard. EXTREMITIES:  3+ pedal edema. NEUROLOGIC:  No new focal deficits at this time.  Over the course of hospitalization, a PT/OT evaluation was called, recommended 24 hour surveillance, possibly discharge to a skilled nursing facility.  The patient will be discharge to skilled nursing facility when bed available.  He is recommended to follow up with the physician at the facility as needed.  He will also finish 2 weeks of antibiotics as a course for his abdominal wall abscess.          ______________________________ Kathlen Mody, MD     VA/MEDQ  D:  08/01/2010  T:  08/02/2010  Job:  [id number]  Electronically Signed by Kathlen Mody MD on 08/21/2010 02:12:52 AM

## 2010-09-10 ENCOUNTER — Emergency Department (HOSPITAL_COMMUNITY): Payer: Medicare Other

## 2010-09-10 ENCOUNTER — Inpatient Hospital Stay (HOSPITAL_COMMUNITY)
Admission: EM | Admit: 2010-09-10 | Discharge: 2010-09-18 | DRG: 441 | Disposition: A | Discharge status: Skilled Nursing Facility | Payer: Medicare Other | Attending: Internal Medicine | Admitting: Internal Medicine

## 2010-09-10 DIAGNOSIS — F1020 Alcohol dependence, uncomplicated: Secondary | ICD-10-CM | POA: Diagnosis present

## 2010-09-10 DIAGNOSIS — Z888 Allergy status to other drugs, medicaments and biological substances status: Secondary | ICD-10-CM

## 2010-09-10 DIAGNOSIS — G929 Unspecified toxic encephalopathy: Secondary | ICD-10-CM | POA: Diagnosis present

## 2010-09-10 DIAGNOSIS — B1921 Unspecified viral hepatitis C with hepatic coma: Principal | ICD-10-CM | POA: Diagnosis present

## 2010-09-10 DIAGNOSIS — G8929 Other chronic pain: Secondary | ICD-10-CM | POA: Diagnosis present

## 2010-09-10 DIAGNOSIS — D61818 Other pancytopenia: Secondary | ICD-10-CM | POA: Diagnosis present

## 2010-09-10 DIAGNOSIS — J690 Pneumonitis due to inhalation of food and vomit: Secondary | ICD-10-CM | POA: Diagnosis not present

## 2010-09-10 DIAGNOSIS — E039 Hypothyroidism, unspecified: Secondary | ICD-10-CM | POA: Diagnosis present

## 2010-09-10 DIAGNOSIS — Z79899 Other long term (current) drug therapy: Secondary | ICD-10-CM

## 2010-09-10 DIAGNOSIS — E871 Hypo-osmolality and hyponatremia: Secondary | ICD-10-CM | POA: Diagnosis present

## 2010-09-10 DIAGNOSIS — Z880 Allergy status to penicillin: Secondary | ICD-10-CM

## 2010-09-10 DIAGNOSIS — M549 Dorsalgia, unspecified: Secondary | ICD-10-CM | POA: Diagnosis present

## 2010-09-10 DIAGNOSIS — F319 Bipolar disorder, unspecified: Secondary | ICD-10-CM | POA: Diagnosis present

## 2010-09-10 DIAGNOSIS — Z66 Do not resuscitate: Secondary | ICD-10-CM | POA: Diagnosis present

## 2010-09-10 DIAGNOSIS — F10939 Alcohol use, unspecified with withdrawal, unspecified: Secondary | ICD-10-CM | POA: Diagnosis present

## 2010-09-10 DIAGNOSIS — G92 Toxic encephalopathy: Secondary | ICD-10-CM | POA: Diagnosis present

## 2010-09-10 DIAGNOSIS — R569 Unspecified convulsions: Secondary | ICD-10-CM | POA: Diagnosis present

## 2010-09-10 DIAGNOSIS — K219 Gastro-esophageal reflux disease without esophagitis: Secondary | ICD-10-CM | POA: Diagnosis present

## 2010-09-10 DIAGNOSIS — F10239 Alcohol dependence with withdrawal, unspecified: Secondary | ICD-10-CM | POA: Diagnosis present

## 2010-09-10 DIAGNOSIS — F1096 Alcohol use, unspecified with alcohol-induced persisting amnestic disorder: Secondary | ICD-10-CM | POA: Diagnosis present

## 2010-09-10 DIAGNOSIS — K7030 Alcoholic cirrhosis of liver without ascites: Secondary | ICD-10-CM | POA: Diagnosis present

## 2010-09-10 HISTORY — DX: Alcohol abuse, uncomplicated: F10.10

## 2010-09-10 HISTORY — DX: Bipolar disorder, unspecified: F31.9

## 2010-09-10 HISTORY — DX: Unspecified psychosis not due to a substance or known physiological condition: F29

## 2010-09-10 HISTORY — DX: Hypothyroidism, unspecified: E03.9

## 2010-09-10 HISTORY — DX: Unspecified cirrhosis of liver: K74.60

## 2010-09-10 HISTORY — DX: Unspecified viral hepatitis C without hepatic coma: B19.20

## 2010-09-10 LAB — POCT I-STAT, CHEM 8
BUN: 29 mg/dL — ABNORMAL HIGH (ref 6–23)
Calcium, Ion: 1.03 mmol/L — ABNORMAL LOW (ref 1.12–1.32)
Chloride: 99 mEq/L (ref 96–112)
Creatinine, Ser: 1.7 mg/dL — ABNORMAL HIGH (ref 0.50–1.35)
Glucose, Bld: 153 mg/dL — ABNORMAL HIGH (ref 70–99)
Potassium: 4.1 mEq/L (ref 3.5–5.1)

## 2010-09-10 LAB — CK TOTAL AND CKMB (NOT AT ARMC): Relative Index: INVALID (ref 0.0–2.5)

## 2010-09-10 LAB — URINALYSIS, ROUTINE W REFLEX MICROSCOPIC
Bilirubin Urine: NEGATIVE
Hgb urine dipstick: NEGATIVE
Ketones, ur: NEGATIVE mg/dL
Nitrite: NEGATIVE
Specific Gravity, Urine: 1.014 (ref 1.005–1.030)
pH: 6 (ref 5.0–8.0)

## 2010-09-10 LAB — DIFFERENTIAL
Eosinophils Absolute: 0.1 10*3/uL (ref 0.0–0.7)
Eosinophils Relative: 2 % (ref 0–5)
Lymphocytes Relative: 20 % (ref 12–46)
Lymphs Abs: 1 10*3/uL (ref 0.7–4.0)
Monocytes Absolute: 0.4 10*3/uL (ref 0.1–1.0)
Monocytes Relative: 7 % (ref 3–12)

## 2010-09-10 LAB — CBC
HCT: 28.6 % — ABNORMAL LOW (ref 39.0–52.0)
MCH: 35.3 pg — ABNORMAL HIGH (ref 26.0–34.0)
MCHC: 36.4 g/dL — ABNORMAL HIGH (ref 30.0–36.0)
MCV: 96.9 fL (ref 78.0–100.0)
Platelets: 51 10*3/uL — ABNORMAL LOW (ref 150–400)
RDW: 14.7 % (ref 11.5–15.5)
WBC: 4.8 10*3/uL (ref 4.0–10.5)

## 2010-09-10 LAB — AMMONIA: Ammonia: 99 umol/L — ABNORMAL HIGH (ref 11–60)

## 2010-09-11 ENCOUNTER — Inpatient Hospital Stay (HOSPITAL_COMMUNITY): Payer: Medicare Other

## 2010-09-11 LAB — COMPREHENSIVE METABOLIC PANEL
ALT: 26 U/L (ref 0–53)
AST: 45 U/L — ABNORMAL HIGH (ref 0–37)
Albumin: 1.9 g/dL — ABNORMAL LOW (ref 3.5–5.2)
Calcium: 8.6 mg/dL (ref 8.4–10.5)
Chloride: 97 mEq/L (ref 96–112)
Creatinine, Ser: 1.36 mg/dL — ABNORMAL HIGH (ref 0.50–1.35)
Sodium: 130 mEq/L — ABNORMAL LOW (ref 135–145)
Total Bilirubin: 2 mg/dL — ABNORMAL HIGH (ref 0.3–1.2)

## 2010-09-11 LAB — URINE CULTURE
Colony Count: NO GROWTH
Culture: NO GROWTH

## 2010-09-11 LAB — AMMONIA: Ammonia: 124 umol/L — ABNORMAL HIGH (ref 11–60)

## 2010-09-12 LAB — CBC
Hemoglobin: 9.2 g/dL — ABNORMAL LOW (ref 13.0–17.0)
MCH: 34.8 pg — ABNORMAL HIGH (ref 26.0–34.0)
MCHC: 36.5 g/dL — ABNORMAL HIGH (ref 30.0–36.0)
MCV: 95.5 fL (ref 78.0–100.0)
Platelets: 44 10*3/uL — ABNORMAL LOW (ref 150–400)
RBC: 2.64 MIL/uL — ABNORMAL LOW (ref 4.22–5.81)
RBC: 3.06 MIL/uL — ABNORMAL LOW (ref 4.22–5.81)
WBC: 4.9 10*3/uL (ref 4.0–10.5)

## 2010-09-12 LAB — COMPREHENSIVE METABOLIC PANEL
ALT: 32 U/L (ref 0–53)
AST: 49 U/L — ABNORMAL HIGH (ref 0–37)
Albumin: 2.3 g/dL — ABNORMAL LOW (ref 3.5–5.2)
Calcium: 9.2 mg/dL (ref 8.4–10.5)
GFR calc Af Amer: >60 mL/min (ref >60)
Sodium: 135 mEq/L (ref 135–145)
Total Protein: 8.5 g/dL — ABNORMAL HIGH (ref 6.0–8.3)

## 2010-09-12 LAB — PROTIME-INR
INR: 1.97 — ABNORMAL HIGH (ref 0.00–1.49)
Prothrombin Time: 22.8 seconds — ABNORMAL HIGH (ref 11.6–15.2)

## 2010-09-12 LAB — APTT: aPTT: 42 seconds — ABNORMAL HIGH (ref 24–37)

## 2010-09-13 LAB — BASIC METABOLIC PANEL
CO2: 25 mEq/L (ref 19–32)
Calcium: 8.8 mg/dL (ref 8.4–10.5)
Glucose, Bld: 143 mg/dL — ABNORMAL HIGH (ref 70–99)
Sodium: 129 mEq/L — ABNORMAL LOW (ref 135–145)

## 2010-09-13 LAB — CBC
HCT: 29.8 % — ABNORMAL LOW (ref 39.0–52.0)
Hemoglobin: 10.8 g/dL — ABNORMAL LOW (ref 13.0–17.0)
MCH: 35.2 pg — ABNORMAL HIGH (ref 26.0–34.0)
MCV: 97.1 fL (ref 78.0–100.0)
RBC: 3.07 MIL/uL — ABNORMAL LOW (ref 4.22–5.81)

## 2010-09-14 ENCOUNTER — Inpatient Hospital Stay (HOSPITAL_COMMUNITY): Payer: Medicare Other

## 2010-09-14 ENCOUNTER — Encounter (HOSPITAL_COMMUNITY): Payer: Self-pay | Admitting: Radiology

## 2010-09-14 LAB — LACTATE DEHYDROGENASE: LDH: 151 U/L (ref 94–250)

## 2010-09-14 LAB — CBC
Hemoglobin: 10.8 g/dL — ABNORMAL LOW (ref 13.0–17.0)
MCH: 34.6 pg — ABNORMAL HIGH (ref 26.0–34.0)
Platelets: 50 10*3/uL — ABNORMAL LOW (ref 150–400)
RBC: 3.12 MIL/uL — ABNORMAL LOW (ref 4.22–5.81)
WBC: 7.5 10*3/uL (ref 4.0–10.5)

## 2010-09-14 LAB — HEPATIC FUNCTION PANEL
ALT: 31 U/L (ref 0–53)
Albumin: 2.3 g/dL — ABNORMAL LOW (ref 3.5–5.2)
Alkaline Phosphatase: 111 U/L (ref 39–117)
Indirect Bilirubin: 2.3 mg/dL — ABNORMAL HIGH (ref 0.3–0.9)
Total Protein: 8.3 g/dL (ref 6.0–8.3)

## 2010-09-14 LAB — GLUCOSE, CAPILLARY

## 2010-09-14 LAB — BASIC METABOLIC PANEL
BUN: 19 mg/dL (ref 6–23)
CO2: 25 mEq/L (ref 19–32)
CO2: 23 mEq/L (ref 19–32)
CO2: 25 mEq/L (ref 19–32)
Calcium: 9 mg/dL (ref 8.4–10.5)
Calcium: 9.1 mg/dL (ref 8.4–10.5)
Calcium: 9.2 mg/dL (ref 8.4–10.5)
Chloride: 94 mEq/L — ABNORMAL LOW (ref 96–112)
Chloride: 95 mEq/L — ABNORMAL LOW (ref 96–112)
Chloride: 96 mEq/L (ref 96–112)
Creatinine, Ser: 0.75 mg/dL (ref 0.50–1.35)
Creatinine, Ser: 0.75 mg/dL (ref 0.50–1.35)
Glucose, Bld: 100 mg/dL — ABNORMAL HIGH (ref 70–99)
Potassium: 4.6 mEq/L (ref 3.5–5.1)
Sodium: 127 mEq/L — ABNORMAL LOW (ref 135–145)

## 2010-09-14 LAB — MAGNESIUM: Magnesium: 1.6 mg/dL (ref 1.5–2.5)

## 2010-09-14 LAB — LIPID PANEL
HDL: 48 mg/dL (ref >39)
LDL Cholesterol: 37 mg/dL (ref 0–99)
Total CHOL/HDL Ratio: 2 RATIO
VLDL: 11 mg/dL (ref 0–40)

## 2010-09-14 LAB — BLOOD GAS, ARTERIAL
Acid-base deficit: 8.1 mmol/L — ABNORMAL HIGH (ref 0.0–2.0)
Drawn by: 281201
O2 Content: 4 L/min
O2 Saturation: 95.3 %
pCO2 arterial: 34.5 mmHg — ABNORMAL LOW (ref 35.0–45.0)

## 2010-09-14 LAB — PRO B NATRIURETIC PEPTIDE: Pro B Natriuretic peptide (BNP): 193 pg/mL — ABNORMAL HIGH (ref 0–125)

## 2010-09-14 LAB — CARDIAC PANEL(CRET KIN+CKTOT+MB+TROPI): Relative Index: INVALID (ref 0.0–2.5)

## 2010-09-14 MED ORDER — IOHEXOL 300 MG/ML  SOLN
100.0000 mL | Freq: Once | INTRAMUSCULAR | Status: AC | PRN
  Administered 2010-09-14: 100 mL via INTRAVENOUS

## 2010-09-14 MED ORDER — IOHEXOL 300 MG/ML  SOLN
75.0000 mL | Freq: Once | INTRAMUSCULAR | Status: AC | PRN
  Administered 2010-09-14: 75 mL via INTRAVENOUS

## 2010-09-15 ENCOUNTER — Inpatient Hospital Stay (HOSPITAL_COMMUNITY): Payer: Medicare Other

## 2010-09-15 ENCOUNTER — Other Ambulatory Visit (HOSPITAL_COMMUNITY): Payer: Medicare Other

## 2010-09-15 LAB — CARDIAC PANEL(CRET KIN+CKTOT+MB+TROPI)
CK, MB: 2 ng/mL (ref 0.3–4.0)
Troponin I: <0.3 ng/mL (ref <0.30)
Troponin I: <0.3 ng/mL (ref <0.30)

## 2010-09-15 LAB — IRON AND TIBC: UIBC: <55 ug/dL

## 2010-09-15 LAB — COMPREHENSIVE METABOLIC PANEL
ALT: 24 U/L (ref 0–53)
Albumin: 2.2 g/dL — ABNORMAL LOW (ref 3.5–5.2)
Alkaline Phosphatase: 107 U/L (ref 39–117)
Chloride: 97 mEq/L (ref 96–112)
Glucose, Bld: 120 mg/dL — ABNORMAL HIGH (ref 70–99)
Potassium: 4.3 mEq/L (ref 3.5–5.1)
Sodium: 128 mEq/L — ABNORMAL LOW (ref 135–145)
Total Protein: 8.1 g/dL (ref 6.0–8.3)

## 2010-09-15 LAB — URINALYSIS, MICROSCOPIC ONLY
Glucose, UA: NEGATIVE mg/dL
Leukocytes, UA: NEGATIVE
pH: 6 (ref 5.0–8.0)

## 2010-09-15 LAB — FOLATE: Folate: >20 ng/mL

## 2010-09-15 LAB — CBC
HCT: 29.2 % — ABNORMAL LOW (ref 39.0–52.0)
Hemoglobin: 10.7 g/dL — ABNORMAL LOW (ref 13.0–17.0)
MCHC: 36.6 g/dL — ABNORMAL HIGH (ref 30.0–36.0)
MCV: 96.4 fL (ref 78.0–100.0)

## 2010-09-15 LAB — VITAMIN B12: Vitamin B-12: >2000 pg/mL — ABNORMAL HIGH (ref 211–911)

## 2010-09-15 LAB — SODIUM, URINE, RANDOM: Sodium, Ur: <10 mEq/L

## 2010-09-15 MED ORDER — GADOBENATE DIMEGLUMINE 529 MG/ML IV SOLN
20.0000 mL | Freq: Once | INTRAVENOUS | Status: AC | PRN
  Administered 2010-09-15: 20 mL via INTRAVENOUS

## 2010-09-15 NOTE — Procedures (Signed)
EEG NUMBER:  HISTORY:  A 55 year old male with altered mental status and new-onset seizures.  MEDICATIONS:  Folic acid, Lasix, Levaquin, Synthroid, Bactroban, Protonix, Dilantin, K-Dur, Xanax, Ativan, Zofran, and oxycodone.  CONDITIONS OF RECORDING:  This is a 16-channel EEG carried out with the patient in the awake state.  DESCRIPTION:  The background activity is dominated by beta activity. This is seen over all quadrants IN both hemispheres.  Muscle and movement artifacts are prominent as well.  No posterior background alfa can be identified.  The patient does not drowse during the tracing. Stage II sleep was not obtained either.  Hypoventilation was not performed.  Intermittent photic stimulation elicited symmetrical driving response but failed to elicit any other change in the tracing.  No epileptiform activity is noted.  IMPRESSION:  This is an EEG that is dominated by beta rhythms which are often seen in patients on benzodiazepines as is the case with this patient.  No epileptiform activity is noted.          ______________________________ Thana Farr, MD    [id number] D:  09/15/2010 18:00:45  T:  09/15/2010 23:38:42  Job #:  [id number]

## 2010-09-16 LAB — CBC
Hemoglobin: 10.3 g/dL — ABNORMAL LOW (ref 13.0–17.0)
MCH: 34.9 pg — ABNORMAL HIGH (ref 26.0–34.0)
MCHC: 35.8 g/dL (ref 30.0–36.0)

## 2010-09-16 LAB — BASIC METABOLIC PANEL
Calcium: 8.9 mg/dL (ref 8.4–10.5)
GFR calc non Af Amer: >60 mL/min (ref >60)
Glucose, Bld: 122 mg/dL — ABNORMAL HIGH (ref 70–99)
Sodium: 129 mEq/L — ABNORMAL LOW (ref 135–145)

## 2010-09-18 LAB — CBC
MCH: 34.5 pg — ABNORMAL HIGH (ref 26.0–34.0)
MCHC: 35.2 g/dL (ref 30.0–36.0)
Platelets: 48 10*3/uL — ABNORMAL LOW (ref 150–400)
RBC: 2.93 MIL/uL — ABNORMAL LOW (ref 4.22–5.81)

## 2010-09-18 LAB — BASIC METABOLIC PANEL
CO2: 25 mEq/L (ref 19–32)
Calcium: 8.3 mg/dL — ABNORMAL LOW (ref 8.4–10.5)
GFR calc Af Amer: >60 mL/min (ref >60)
GFR calc non Af Amer: >60 mL/min (ref >60)
Sodium: 132 mEq/L — ABNORMAL LOW (ref 135–145)

## 2010-09-18 NOTE — Discharge Summary (Signed)
NAME:  Tony Boyd, Tony Boyd NO.:  [id number]  MEDICAL RECORD NO.:  [id number]  LOCATION:  5040                         FACILITY:  [facility]  PHYSICIAN:  Conley Canal, MD      DATE OF BIRTH:  07-May-1955  DATE OF ADMISSION:  09/10/2010 DATE OF DISCHARGE:                        DISCHARGE SUMMARY - REFERRING   PRIMARY CARE PHYSICIAN:  Florentina Jenny, MD  CONSULTING PHYSICIANS:  Thana Farr, MD  DISCHARGE DIAGNOSES: 1. Adult onset seizures, likely combination of alcohol-benzodiazepines     withdrawal as well as metabolic derangement. 2. Right lower lobe pneumonia, likely aspiration. 3. Hyponatremia secondary to dehydration. 4. Toxic metabolic encephalopathy in the setting of liver disease. 5. Alcoholic liver cirrhosis. 6. Recent history of abdominal abscesses status post percutaneous     drainage. 7. Bipolar disorder. 8. History of esophageal varices. 9. Hypothyroidism. 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 11.Chronic back pain.  DISCHARGE MEDICATIONS: 1. Clindamycin 450 mg every 6 hours for 4 more days. 2. Lasix 40 mg daily. 3. Lactulose 20 grams twice daily. 4. Keppra 500 mg twice daily. 5. Synthroid 75 mcg daily. 6. KCl 20 mEq daily. 7. Alprazolam 1 mg 3 times daily as needed. 8. Ferrous sulfate 325 mg at bedtime. 9. Folic acid 1 mg daily. 10.Multivitamins 1 tablet daily. 11.Prilosec 20 mg daily. 12.Oxycodone 5 mg every 4 hours as needed. 13.Risperidone 0.5 mg twice daily. 14.Spironolactone 200 mg daily.  PROCEDURES PERFORMED: 1. EEG on September 15, 2010 showed better rhythms which I have seen in     patients on benzodiazepines. 2. MRI of the brain with and without contrast on September 16, 2010 showed     premature atrophy, no acute intracranial findings. 3. CT of the chest with contrast on September 14, 2010 showed no evidence     of acute pulmonary embolism, but new right lower lobe consolidation     likely secondary to aspiration as well as minor left lung      atelectasis. 4. Abdominal ultrasound on September 15, 2010 showed no significant     ascites.  HOSPITAL COURSE:  Mr. Carducci was admitted on September 11, 2010 with complaints of abdominal pain and confusion.  His ammonia level was 124 on the day of admission raising concern for hepatic encephalopathy.  He was also hyponatremic.  Initially, the patient was placed on lactulose and continued on diuretics, however, his sodium continued to decline to a low of 125 with a workup for hyponatremia suggested dehydration as his urine sodium was less than 10 and urine specific gravity greater than 1.045.  As a result, the patient was rehydrated and has continued to improve.  During the hospitalization, the patient had at least two witnessed generalized tonic clonic seizures which prompted workup including EEG, MRI as well as Neurology consultation.  Dr. Ferne Coe graciously saw the patient and recommended the patient to continue Keppra and follow with Neurology in the outpatient setting.  The patient responded to clindamycin for pneumonia which is likely secondary to aspiration.  He has done relatively well and if he continues to do well, he can be discharged in a day or two.  DISCHARGE LABORATORY DATA:  WBC 8.1, hemoglobin 10.3, hematocrit 28.8, and platelet count 49.  Sodium 129, potassium 4, BUN 17, creatinine 0.93, and calcium 8.9.     Conley Canal, MD     SR/MEDQ  D:  09/17/2010  T:  09/17/2010  Job:  [id number]  cc:   Florentina Jenny, MD  Electronically Signed by Conley Canal  on 09/18/2010 02:52:16 PM

## 2010-09-21 NOTE — Consult Note (Signed)
NAME:  BIRT, REINOSO NO.:  [id number]  MEDICAL RECORD NO.:  [id number]  LOCATION:  3305                         FACILITY:  [facility]  PHYSICIAN:  Thana Farr, MD    DATE OF BIRTH:  [birth year]  DATE OF CONSULTATION:  09/15/2010 DATE OF DISCHARGE:                                CONSULTATION   REASON FOR CONSULTATION:  Seizure activity after being admitted for abdominal pain.  HISTORY OF PRESENT ILLNESS:  This is a 55 year old gentleman with end- stage liver disease and recent hospitalization for intra-abdominal abscess.  The patient was discharged earlier in June 2012 on Augmentin which he completed.  The patient returned to the emergency room on September 10, 2010 for generalized abdominal pain.  The patient was admitted to the hospital for further workup.  During hospitalization, the patient had a tonic-clonic seizure.  For that reason, Neurology was consulted for recommendations on antiepileptic medications.  It should be noted that the patient does have a history of alcoholic cirrhosis, alcoholism, abdominal abscess, hep C, thrombocytopenia, bipolar, anemia, esophageal varices, and hypothyroidism.  In speaking with the daughter, apparently his last drink was back in April, however, at the present time the patient is slightly diaphoretic with a heart rate of 94.  The patient has not had any further seizures at this time.  PAST MEDICAL HISTORY:  Significant for alcoholic cirrhosis, history of alcoholism, arthritis, intra-abdominal abscess status post percutaneous drainage and drain removal, hep C, liver cirrhosis with a Child-Pugh class 3, thrombocytopenia, bipolar, anemia, esophageal varices, hypothyroidism, Korsakoff psychosis, and back pain.  MEDICATIONS:  Alprazolam, ibuprofen, oxycodone, Lasix, ferrous sulfate, folic acid, lactulose, multivitamin, omeprazole, risperidone, and Aldactone.  ALLERGIES:  DROPERIDOL, PENICILLIN, and TORADOL.  SOCIAL  HISTORY:  The patient is a DNR/DNI.  He still continues to drink, however, he used to be a heavy drinker.  Denies tobacco use who lives alone and denies any drug use.  FAMILY HISTORY:  Negative.  REVIEW OF SYSTEMS:  Negative with the exception of abdominal pain, bipolar, newly-diagnosed seizure, and joint pain.  PHYSICAL EXAMINATION:  VITAL SIGNS:  Blood pressure is 134/67, heart rate 97, respirations 12, temperature is 97.5, and O2 sat is 100% on room air. LUNGS:  Clear to auscultation. CARDIOVASCULAR:  S1-S2 is audible. NECK:  Negative for bruits. ABDOMEN:  Protuberant but nontender at this time. SKIN:  Jaundiced but warm, dry, and intact. NEUROLOGIC:  Cranial nerves II-XII are grossly intact.  Pupils equal and reactive.  Face is equal and symmetrical.  Tongue is midline.  Vision is intact to double simultaneous stimuli.  Extraocular movements are grossly intact.  Pupils are approximately 3 mm-2 mm.  Facial sensation is grossly intact.  Shoulder shrug and head turn is within normal limits.  The patient's strength is 5/5 throughout.  Deep tendon reflexes are 2+ with downgoing toes.  Finger-to-nose and heel-to-shin were smooth.  The patient's sensation to pinprick and light touch is grossly intact.  LABORATORY DATA:  Urinalysis is negative.  White blood cell counts 8.2, hemoglobin 10.7, hematocrit 29.2, and his platelet count is very low at 51.  Last Dilantin level was less than 2.  Sodium 128, potassium 4.3, chloride 97, CO2  26, glucose 120, BUN 17, and creatinine 0.79.  AST is 35, ALT is 24, albumin is 2.2, magnesium is 2.1,and  phosphorus is 3.3. D-dimer is 1.49.  TSH is 0.897.  Fasting lipid panel was cholesterol 96, triglycerides 57, HDL 48, and LDL 37.  RADIOLOGICAL DATA:  MRI of brain is pending.  CT of head shows normal and stable CT examination.  ASSESSMENT:  This is a 54 year old male with known liver cirrhosis presenting with intra-abdominal pain to [facility].   While the patient was admitted to [facility] and the patient was in CT scanner when he had a witnessed seizure activity described as tonic-clonic.  The patient was given Dilantin at that time and has been placed on 100 mg q.8 hours since the event.  The patient has had no seizures since that initial event.  Neurology was consulted for further evaluation and recommendations on antiepileptics.  RECOMMENDATIONS:  Our recommendations at this time are: 1. Stop Dilantin as this is metabolized through the liver and the     patient has no liver cirrhosis.  We will start him on Keppra 500 mg     b.i.d. IV/p.o. as this is cleared renally. 2. We would recommend continuing with MRI of brain to look for any     intracranial pathology. 3. EEG is in progress as I am dictating this dictation. 4. We would continue to monitor the patient's electrolytes and     glucose.     Felicie Morn, PA-C   ______________________________ Thana Farr, MD    DS/MEDQ  D:  09/15/2010  T:  09/15/2010  Job:  [id number]  Electronically Signed by Felicie Morn PA-C on 09/19/2010 11:12:17 AM Electronically Signed by Thana Farr MD on 09/21/2010 01:35:53 PM

## 2010-10-09 ENCOUNTER — Emergency Department (HOSPITAL_COMMUNITY): Payer: Medicare Other

## 2010-10-09 ENCOUNTER — Inpatient Hospital Stay (HOSPITAL_COMMUNITY)
Admission: EM | Admit: 2010-10-09 | Discharge: 2010-10-12 | DRG: 441 | Disposition: A | Discharge status: Skilled Nursing Facility | Payer: Medicare Other | Attending: Internal Medicine | Admitting: Internal Medicine

## 2010-10-09 DIAGNOSIS — K7682 Hepatic encephalopathy: Principal | ICD-10-CM | POA: Diagnosis present

## 2010-10-09 DIAGNOSIS — G9341 Metabolic encephalopathy: Secondary | ICD-10-CM | POA: Diagnosis present

## 2010-10-09 DIAGNOSIS — D61818 Other pancytopenia: Secondary | ICD-10-CM | POA: Diagnosis present

## 2010-10-09 DIAGNOSIS — M549 Dorsalgia, unspecified: Secondary | ICD-10-CM | POA: Diagnosis present

## 2010-10-09 DIAGNOSIS — K7030 Alcoholic cirrhosis of liver without ascites: Secondary | ICD-10-CM | POA: Diagnosis present

## 2010-10-09 DIAGNOSIS — G40909 Epilepsy, unspecified, not intractable, without status epilepticus: Secondary | ICD-10-CM | POA: Diagnosis present

## 2010-10-09 DIAGNOSIS — D696 Thrombocytopenia, unspecified: Secondary | ICD-10-CM | POA: Diagnosis present

## 2010-10-09 DIAGNOSIS — Z8619 Personal history of other infectious and parasitic diseases: Secondary | ICD-10-CM

## 2010-10-09 DIAGNOSIS — G8929 Other chronic pain: Secondary | ICD-10-CM | POA: Diagnosis present

## 2010-10-09 DIAGNOSIS — K7290 Hepatic failure, unspecified without coma: Principal | ICD-10-CM | POA: Diagnosis present

## 2010-10-09 DIAGNOSIS — Z880 Allergy status to penicillin: Secondary | ICD-10-CM

## 2010-10-09 DIAGNOSIS — Z9181 History of falling: Secondary | ICD-10-CM

## 2010-10-09 DIAGNOSIS — F319 Bipolar disorder, unspecified: Secondary | ICD-10-CM | POA: Diagnosis present

## 2010-10-09 DIAGNOSIS — E039 Hypothyroidism, unspecified: Secondary | ICD-10-CM | POA: Diagnosis present

## 2010-10-09 DIAGNOSIS — E871 Hypo-osmolality and hyponatremia: Secondary | ICD-10-CM | POA: Diagnosis present

## 2010-10-09 LAB — URINALYSIS, ROUTINE W REFLEX MICROSCOPIC
Bilirubin Urine: NEGATIVE
Glucose, UA: NEGATIVE mg/dL
Hgb urine dipstick: NEGATIVE
Ketones, ur: NEGATIVE mg/dL
Leukocytes, UA: NEGATIVE
pH: 5.5 (ref 5.0–8.0)

## 2010-10-09 LAB — MRSA PCR SCREENING: MRSA by PCR: NEGATIVE

## 2010-10-09 LAB — DIFFERENTIAL
Basophils Relative: 0 % (ref 0–1)
Eosinophils Absolute: 0.1 10*3/uL (ref 0.0–0.7)
Lymphs Abs: 0.6 10*3/uL — ABNORMAL LOW (ref 0.7–4.0)
Monocytes Absolute: 0.5 10*3/uL (ref 0.1–1.0)
Monocytes Relative: 9 % (ref 3–12)
Neutrophils Relative %: 79 % — ABNORMAL HIGH (ref 43–77)

## 2010-10-09 LAB — COMPREHENSIVE METABOLIC PANEL
ALT: 44 U/L (ref 0–53)
AST: 84 U/L — ABNORMAL HIGH (ref 0–37)
Alkaline Phosphatase: 106 U/L (ref 39–117)
CO2: 25 mEq/L (ref 19–32)
Chloride: 97 mEq/L (ref 96–112)
GFR calc Af Amer: >60 mL/min (ref >60)
GFR calc non Af Amer: >60 mL/min (ref >60)
Glucose, Bld: 135 mg/dL — ABNORMAL HIGH (ref 70–99)
Sodium: 130 mEq/L — ABNORMAL LOW (ref 135–145)
Total Bilirubin: 3.4 mg/dL — ABNORMAL HIGH (ref 0.3–1.2)

## 2010-10-09 LAB — RAPID URINE DRUG SCREEN, HOSP PERFORMED
Amphetamines: NOT DETECTED
Barbiturates: NOT DETECTED
Benzodiazepines: POSITIVE — AB
Tetrahydrocannabinol: NOT DETECTED

## 2010-10-09 LAB — CBC
Hemoglobin: 9.9 g/dL — ABNORMAL LOW (ref 13.0–17.0)
MCH: 34.1 pg — ABNORMAL HIGH (ref 26.0–34.0)
MCHC: 36 g/dL (ref 30.0–36.0)
MCV: 94.8 fL (ref 78.0–100.0)
RBC: 2.9 MIL/uL — ABNORMAL LOW (ref 4.22–5.81)

## 2010-10-09 LAB — AMMONIA: Ammonia: 77 umol/L — ABNORMAL HIGH (ref 11–60)

## 2010-10-09 LAB — POCT I-STAT, CHEM 8
HCT: 30 % — ABNORMAL LOW (ref 39.0–52.0)
Hemoglobin: 10.2 g/dL — ABNORMAL LOW (ref 13.0–17.0)
Potassium: 4.7 mEq/L (ref 3.5–5.1)
Sodium: 133 mEq/L — ABNORMAL LOW (ref 135–145)

## 2010-10-10 ENCOUNTER — Inpatient Hospital Stay (HOSPITAL_COMMUNITY): Payer: Medicare Other

## 2010-10-10 LAB — CBC
HCT: 29.3 % — ABNORMAL LOW (ref 39.0–52.0)
MCHC: 36.2 g/dL — ABNORMAL HIGH (ref 30.0–36.0)
Platelets: 40 10*3/uL — ABNORMAL LOW (ref 150–400)
RDW: 15.1 % (ref 11.5–15.5)

## 2010-10-10 LAB — COMPREHENSIVE METABOLIC PANEL
Albumin: 2.4 g/dL — ABNORMAL LOW (ref 3.5–5.2)
Alkaline Phosphatase: 98 U/L (ref 39–117)
BUN: 11 mg/dL (ref 6–23)
Potassium: 3.7 mEq/L (ref 3.5–5.1)
Total Protein: 7.5 g/dL (ref 6.0–8.3)

## 2010-10-10 LAB — URINE CULTURE
Colony Count: NO GROWTH
Culture: NO GROWTH

## 2010-10-11 LAB — COMPREHENSIVE METABOLIC PANEL
AST: 60 U/L — ABNORMAL HIGH (ref 0–37)
Albumin: 2 g/dL — ABNORMAL LOW (ref 3.5–5.2)
Calcium: 8.4 mg/dL (ref 8.4–10.5)
Creatinine, Ser: 0.84 mg/dL (ref 0.50–1.35)

## 2010-10-11 LAB — CBC
MCH: 34 pg (ref 26.0–34.0)
MCV: 96.9 fL (ref 78.0–100.0)
Platelets: 33 10*3/uL — ABNORMAL LOW (ref 150–400)
RDW: 15.3 % (ref 11.5–15.5)

## 2010-10-11 LAB — AMMONIA: Ammonia: 37 umol/L (ref 11–60)

## 2010-10-11 NOTE — Procedures (Unsigned)
EEG NUMBER:  BRIEF HISTORY:  A 55 year old man from nursing home with known history of seizures, liver cirrhosis, admitted with altered mental status, EEG for evaluation.  MEDICATIONS:  He is on Narcan, Keppra, Protonix, Zofran, Ventolin, alprazolam, folate, Lasix, omeprazole, oxycodone, Risperdal.  EEG DURATION:  21.5 minutes of EEG recording.  EEG DESCRIPTION:  This is a routine 16-channel adult EEG recording with 1 channel devoted to limited EKG recording.  Activation procedure is performed during the photic stimulation.  Study is being performed in awake and mild drowsy state.  As the EEG opens up, I do not see notice any clear location for the posterior dominant rhythm.  The background rhythm consists of high theta to almost borderline alpha ranges with superimposed beta activity pretty much throughout the study.  No electrographic seizures or epileptiform discharges recorded during this study.  Synchronous mild driving was noted with the photic stimulation in posterior leads.  EEG INTERPRETATION:  This EEG reflects the background rhythm that does reach to borderline alpha rhythm without any evidence for electrographic seizures or epileptiform discharges.  CLINICAL NOTE:  This degree of borderline slowing almost reaching to the alpha frequency reflects almost normal EEG without any seizures or epileptiform discharges.          ______________________________ Levie Heritage, MD    AO:ZHYQ D:  10/10/2010 15:35:55  T:  10/11/2010 00:54:30  Job #:  [id number]

## 2010-10-12 LAB — AMMONIA: Ammonia: 35 umol/L (ref 11–60)

## 2010-10-12 LAB — CBC
Hemoglobin: 9 g/dL — ABNORMAL LOW (ref 13.0–17.0)
MCHC: 35.3 g/dL (ref 30.0–36.0)
RBC: 2.63 MIL/uL — ABNORMAL LOW (ref 4.22–5.81)

## 2010-11-02 NOTE — Discharge Summary (Signed)
NAME:  Tony Boyd, Tony Boyd NO.:  [id number]  MEDICAL RECORD NO.:  [id number]  LOCATION:  3037                         FACILITY:  [facility]  PHYSICIAN:  Ramiro Harvest, MD    DATE OF BIRTH:  06/10/1955  DATE OF ADMISSION:  10/09/2010 DATE OF DISCHARGE:  10/12/2010                              DISCHARGE SUMMARY   PRIMARY CARE PHYSICIAN:  Lenon Curt. Chilton Si, MD  DISCHARGE DIAGNOSES: 1. Acute metabolic encephalopathy secondary to hepatic encephalopathy     resolved. 2. Hepatic encephalopathy. 3. Seizure disorder stable. 4. History of alcoholic liver cirrhosis. 5. Pancytopenia secondary to cirrhosis, stable. 6. History of Korsakoff's psychosis. 7. Chronic back pain. 8. Hypothyroidism. 9. History of hepatitis C. 10.Bipolar disorder. 11.History of esophageal varices. 12.History of abdominal abscesses status post percutaneous drainage. 13.History of seizures felt to be secondary to alcohol and benzo     withdrawal and metabolic derangement July 2012.  DISCHARGE MEDICATIONS: 1. Lactulose 30 mL p.o. q.i.d. 2. Rifaximin 550 mg p.o. b.i.d. 3. Thiamine 100 mg p.o. daily. 4. Alprazolam 1 mg p.o. t.i.d. p.r.n. 5. Iron sulfate 325 mg p.o. at bedtime. 6. Folic acid 1 mg p.o. daily. 7. Lasix 40 mg p.o. daily. 8. Spironolactone 100 mg 2 tablets p.o. daily. 9. Keppra 500 mg p.o. b.i.d. 10.Multivitamin 1 tablet p.o. daily. 11.Omeprazole 20 mg p.o. daily. 12.Oxycodone 5 mg p.o. q.4 h. p.r.n. pain. 13.Potassium chloride 10 mEq p.o. daily. 14.Risperdal 0.5 mg p.o. b.i.d. 15.Synthroid 75 mcg p.o. daily.  DISPOSITION AND FOLLOWUP:  The patient will be discharged back to skilled nursing facility at [facility].  The patient will need a BMET and a CBC checked 1 week post discharge and follow up with Dr. Murray Hodgkins, will need to follow up on his electrolytes, renal function as well as his pancytopenia.  CONSULTATIONS DONE:  None.  PROCEDURES PERFORMED:  EEG shows background  rhythm that does reach to borderline alpha rhythm without any evidence for electrographic seizures or epileptiform discharges which was done October 10, 2010.  A chest x- ray was done October 09, 2010 that showed no active cardiopulmonary disease.  CT of the head without contrast was done October 09, 2010 that showed no evidence of acute intracranial abnormality, atrophy with small- vessel ischemic changes.  A ultrasound-guided PICC line was placed October 09, 2010.  BRIEF ADMISSION HISTORY AND PHYSICAL:  Tony Boyd is a 55 year old Caucasian gentleman with history of alcoholic liver cirrhosis, hepatitis C with other multiple comorbidities who was brought to the ED per EMS for altered mental status.  Per nursing home records, the patient was having decrease in his mental status.  They checked his ammonia level at [facility] and it was reportedly greater than 100.  Also per ED report, the patient had a fall around 3:00 a.m. on the day of admission but with no detailed information about the fall. The patient at the time of admission was confused and unable to give a reliable history.  Per EMS report, he was unresponsive, however, arousable to sternal rub but incomprehensible speech was noted.  The patient at the time of admission was awake, however, very drowsy and lethargic with some confusion.  Repeat ammonia level done in the ED was 77, vital signs were stable.  There was no evidence of airway compromise.  In the ED, the patient was found to have pinpoint pupil, given a dose of Narcan with no significant improvement initially, however, when the patient was seen for a call for admission, he was improving from the description which was reported to admitting physician given the fact that he was able to state he was at [facility] and able to state his name, president, and year, however, he was still very drowsy.  For the rest of admission history and physical, please see  H and P dictated by Dr. Cleotis Lema of job number [id number].  HOSPITAL COURSE: 1. Acute metabolic encephalopathy.  The patient was admitted with     acute metabolic encephalopathy felt to be secondary to hepatic     encephalopathy due to elevated ammonia levels and his history of     cirrhosis and hepatitis C.  The patient was drowsy, unable to give     a good history, and subsequently transferred to the step-down unit.     He was initially started on lactulose, retention enema every 4-6     hours with goal of 2-3 bowel movements per day.  The patient was     made n.p.o. and monitored.  He was seen by speech for a swallow     evaluation which he passed.  Subsequently rifaximin was added 550     mg orally twice daily to his regimen.  The patient improved     clinically, was more alert, and following commands.  His lactulose     was subsequently transitioned to oral lactulose 30 mL 4 times a     day, was maintained on rifaximin and transferred to the tele floor     where the patient remained in stable and improved condition.  The     patient was awake, alert, following commands, and was essentially     back to his baseline.  EEG was done to rule out seizures which was     negative for seizures as there was concern as to whether the     patient was in a postictal state on admission.  The patient     remained in stable condition and will be discharged in stable and     improved condition.  The rest of the patient's chronic medical     issues have remained stable throughout the hospitalization and the     patient was discharged in stable and improved condition.  DISCHARGE VITAL SIGNS:  Temperature 98.0, pulse of 76, respirations 20, blood pressure 120/60, satting 97% on room air.  DISCHARGE LABS:  CBC:  White count 3.3, hemoglobin 9.0, hematocrit 25.5, and a platelet count of 34,000.  Ammonia level of 35.  It was a pleasure taking care of Tony Boyd.     Ramiro Harvest,  MD     DT/MEDQ  D:  10/12/2010  T:  10/12/2010  Job:  [id number]  Electronically Signed by Ramiro Harvest MD on 11/02/2010 12:14:04 PM

## 2010-11-22 LAB — COMPREHENSIVE METABOLIC PANEL
AST: 79 — ABNORMAL HIGH
Albumin: 2.4 — ABNORMAL LOW
Alkaline Phosphatase: 107
Chloride: 96
GFR calc Af Amer: >60
Potassium: 3.2 — ABNORMAL LOW
Sodium: 129 — ABNORMAL LOW
Total Bilirubin: 1.8 — ABNORMAL HIGH
Total Protein: 7.8

## 2010-11-22 LAB — PROTIME-INR: INR: 1.9 — ABNORMAL HIGH

## 2010-11-22 LAB — DIFFERENTIAL
Basophils Absolute: 0.1
Basophils Relative: 1
Eosinophils Relative: 1
Monocytes Absolute: 0.6
Monocytes Relative: 9
Neutro Abs: 5.6

## 2010-11-22 LAB — POCT CARDIAC MARKERS
CKMB, poc: 1.1
Myoglobin, poc: 11.2 — ABNORMAL LOW
Troponin i, poc: <0.05

## 2010-11-22 LAB — ETHANOL: Alcohol, Ethyl (B): <5

## 2010-11-22 LAB — CBC
HCT: 31.4 — ABNORMAL LOW
Platelets: 82 — ABNORMAL LOW
RDW: 17.7 — ABNORMAL HIGH
WBC: 7.4

## 2010-11-22 LAB — AMMONIA: Ammonia: 17

## 2010-11-23 LAB — COMPREHENSIVE METABOLIC PANEL
ALT: 31
AST: 66 — ABNORMAL HIGH
Albumin: 2.1 — ABNORMAL LOW
Alkaline Phosphatase: 82
BUN: 2 — ABNORMAL LOW
CO2: 27
Calcium: 7.8 — ABNORMAL LOW
Chloride: 104
Creatinine, Ser: 0.78
GFR calc Af Amer: >60
GFR calc non Af Amer: >60
Glucose, Bld: 111 — ABNORMAL HIGH
Potassium: 3.3 — ABNORMAL LOW
Sodium: 135
Total Bilirubin: 1.5 — ABNORMAL HIGH
Total Protein: 6.7

## 2010-11-23 LAB — BASIC METABOLIC PANEL
BUN: 1 — ABNORMAL LOW
BUN: 3 — ABNORMAL LOW
CO2: 25
Calcium: 7.8 — ABNORMAL LOW
Calcium: 7.8 — ABNORMAL LOW
Chloride: 102
Creatinine, Ser: 0.51
GFR calc Af Amer: >60
GFR calc non Af Amer: >60
GFR calc non Af Amer: >60
Glucose, Bld: 101 — ABNORMAL HIGH
Glucose, Bld: 82
Potassium: 3.4 — ABNORMAL LOW
Potassium: 4.2
Sodium: 131 — ABNORMAL LOW
Sodium: 133 — ABNORMAL LOW

## 2010-11-23 LAB — CBC
HCT: 28.8 — ABNORMAL LOW
Hemoglobin: 9.8 — ABNORMAL LOW
MCHC: 34.1
MCV: 98.6
MCV: 98.9
Platelets: 60 — ABNORMAL LOW
Platelets: 71 — ABNORMAL LOW
Platelets: 77 — ABNORMAL LOW
RBC: 2.92 — ABNORMAL LOW
RDW: 16.8 — ABNORMAL HIGH
RDW: 17.2 — ABNORMAL HIGH
WBC: 4.5
WBC: 4.5
WBC: 5

## 2010-11-23 LAB — VITAMIN B12: Vitamin B-12: 1000 — ABNORMAL HIGH (ref 211–911)

## 2010-11-23 LAB — MAGNESIUM
Magnesium: 1.5
Magnesium: 1.9

## 2010-11-23 LAB — FOLATE: Folate: 13.3

## 2010-11-23 LAB — RPR: RPR Ser Ql: NONREACTIVE

## 2010-11-29 LAB — COMPREHENSIVE METABOLIC PANEL
ALT: 25
Albumin: 2.4 — ABNORMAL LOW
Alkaline Phosphatase: 138 — ABNORMAL HIGH
BUN: 3 — ABNORMAL LOW
BUN: 5 — ABNORMAL LOW
Calcium: 8.3 — ABNORMAL LOW
Chloride: 107
Creatinine, Ser: 0.62
Glucose, Bld: 123 — ABNORMAL HIGH
Glucose, Bld: 136 — ABNORMAL HIGH
Potassium: 4
Sodium: 138
Total Bilirubin: 2.3 — ABNORMAL HIGH
Total Protein: 8

## 2010-11-29 LAB — CBC
HCT: 31.7 — ABNORMAL LOW
HCT: 37.4 — ABNORMAL LOW
Hemoglobin: 11 — ABNORMAL LOW
Hemoglobin: 12.7 — ABNORMAL LOW
MCHC: 34
RDW: 17.7 — ABNORMAL HIGH
WBC: 2.6 — ABNORMAL LOW

## 2010-11-29 LAB — ETHANOL: Alcohol, Ethyl (B): <5

## 2010-11-29 LAB — TSH: TSH: 1.662

## 2010-11-29 LAB — URINE DRUGS OF ABUSE SCREEN W ALC, ROUTINE (REF LAB)
Amphetamine Screen, Ur: NEGATIVE
Barbiturate Quant, Ur: NEGATIVE
Benzodiazepines.: POSITIVE — AB
Creatinine,U: 94
Marijuana Metabolite: NEGATIVE
Methadone: NEGATIVE
Propoxyphene: NEGATIVE

## 2010-11-29 LAB — ETHANOL CONFIRM, URINE

## 2010-11-29 LAB — BASIC METABOLIC PANEL
GFR calc Af Amer: >60
GFR calc non Af Amer: >60
GFR calc non Af Amer: >60
Potassium: 3.7
Potassium: 4
Sodium: 135
Sodium: 135

## 2010-11-29 LAB — URINALYSIS, ROUTINE W REFLEX MICROSCOPIC
Bilirubin Urine: NEGATIVE
Glucose, UA: NEGATIVE
Ketones, ur: NEGATIVE
Nitrite: NEGATIVE
Protein, ur: NEGATIVE
pH: 6.5

## 2010-11-29 LAB — CULTURE, BLOOD (ROUTINE X 2)

## 2010-11-29 LAB — AMMONIA
Ammonia: 35
Ammonia: 44 — ABNORMAL HIGH

## 2010-11-29 LAB — OPIATE, QUANTITATIVE, URINE
Codeine Urine: NEGATIVE ng/mL
Hydromorphone GC/MS Conf: NEGATIVE ng/mL
Morphine, Confirm: 400 ng/mL

## 2010-11-29 LAB — DIFFERENTIAL
Basophils Absolute: 0
Basophils Relative: 0
Eosinophils Relative: 4
Monocytes Absolute: 0.2

## 2010-11-29 LAB — BENZODIAZEPINE, QUANTITATIVE, URINE: Alprazolam (GC/LC/MS), ur confirm: 120 ng/mL

## 2010-11-29 LAB — PROTIME-INR
INR: 1.5
Prothrombin Time: 18.3 — ABNORMAL HIGH

## 2010-12-06 NOTE — H&P (Signed)
NAME:  RODERICK, CALO NO.:  [id number]  MEDICAL RECORD NO.:  [id number]  LOCATION:  [facility]                         FACILITY:  [facility]  PHYSICIAN:  Baltazar Najjar, MD     DATE OF BIRTH:  [birth year]  DATE OF ADMISSION:  10/09/2010 DATE OF DISCHARGE:                             HISTORY & PHYSICAL   PCP:  Lenon Curt. Chilton Si, MD.  CHIEF COMPLAINT:  Altered mental status.  HISTORY OF PRESENT ILLNESS:  [name] is a 55 year old man with history of liver cirrhosis, hep C, other multiple comorbidities. He was brought into the ER by EMS today for altered mental status.  As per nursing home record, the patient was having decrease in his mental status, they checked his ammonia level in the [facility] and it was reported to be more than 100.  Also, by the ER report, the patient had a fall at 3:00 a.m. but there was no detailed information about the fall.  The patient is currently confused, was unable to give reliable history.  As per EMS report, he was unresponsive, however, arousable to sternal rub, but uncomprehensible speech noted.  The patient is currently by the time I saw he is awake, however, very drowsy and lethargic and confused..  Ammonia level was rechecked in the ED was found to be 77, otherwise, his vital signs were stable.  There was no evidence of airway compromise.  In the ED, the patient was found to have pinpoint pupils and he was given a dose of Narcan with no significant improvement initially, however, when I saw the patient now after I get call for admission it seems like he is improving from the description that was reported to me.  Given the fact that he was able to tell me he is at [facility], he was able to name the president and the year, however, he was still very drowsy.  PAST MEDICAL HISTORY:  Significant for multiple comorbidities including 1. Alcoholic liver cirrhosis. 2. History of hepatitis C. 3. Thrombocytopenia  secondary to cirrhosis. 4. Bipolar disorder. 5. History of esophageal varices. 6. Hypothyroidism. 7. Korsakoff psychosis. 8. Chronic back pain. 9. History of abdominal abscesses, status post percutaneous drainage. 10.Hospitalization back in July 2012 for seizures felt to be secondary     to alcohol, benzos withdrawal and metabolic derangement.  ALLERGIES:  DROPERIDOL gives hives, KETOROLAC, hives, PENICILLIN, unknown.  REVIEW OF SYSTEM:  Very unreliable the patient given his mental status.  SOCIAL HISTORY:  The patient lives in [facility].  He has history of alcoholism.  FAMILY HISTORY:  Unobtainable at this time.  PHYSICAL EXAMINATION:  VITAL SIGNS:  Blood pressure 115/65, heart rate of 88, temperature undocumented, however, reported to me as afebrile, O2 sat 100%. GENERAL:  He is alert, very drowsy, in moderate distress. NECK:  Supple.  No rigidity or meningismus signs.  No JVD. LUNGS:  Clear to auscultation bilaterally. CARDIOVASCULAR:  S1 and S2, regular rhythm and rate. ABDOMEN:  Obese, soft, nontender. EXTREMITIES:  Showed 3+ pedal edema bilaterally. NEURO:  He is alert, very lethargic.  Speech is uncomprehensible, however, he was able to answer the 3 questions of place,  person and time to me with very slurred speech.  He was able to move all his extremities.  On limited neurological exam, I was unable to elicit any focal deficit.  LAB RESULTS:  Urine drug screen positive for benzodiazepines. Urinalysis unremarkable.  Lipase 67.  Alcohol less than 11.  Sodium 130, potassium 4.6, glucose 135, BUN 18, creatinine 1.03, total bilirubin 3.4, AST 84, albumin 2.4, ammonia level 77, PT 24, INR 2.11, hemoglobin 9.9, hematocrit 27.5, platelet count 35.  RADIOLOGY/IMAGING STUDIES:  CT scan of the head showed no evidence of acute intracranial abnormality.  Atrophy with small vessel ischemic changes.  ASSESSMENT/PLAN: 1. Altered mental status most likely secondary  to hepatic     encephalopathy. 2. History of liver cirrhosis, alcoholic. 3. History of hepatitis C. 4. Reported fall. 5. 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 6. Chronic hyponatremia secondary to liver disease. 7. History of seizures.  PLAN: 1. The patient will be admitted to step-down unit for close     monitoring. 2. We will start lactulose retention enemas q.4-6 h with a goal of 2-3     loose bowel movements per day at least. 3. We will keep him n.p.o. for now given his mental status.  I would     request a swallow evaluation. 4. Once he is able to swallow we can add rifaximin 550 mg p.o. b.i.d. 5. Given previous history of seizure and I will also order EEG to rule     out any seizures that is possibly presenting with postictal state     which is unlikely, however, I think it is all mostly secondary to     hepatic encephalopathy. 6. We will gently hydrate. 7. Code status, reported as DNR/DNI as per chart.  However, I was     unable to locate this information from his nursing home record.  We     will contact the nursing home to obtain more information regarding     his code status. 8. GI prophylaxis with a PPI. 9. DVT prophylaxis with sequential compression devices. 10.Further order will be dictated as per his hospital progress/course.          ______________________________ Baltazar Najjar, MD     SA/MEDQ  D:  10/09/2010  T:  10/09/2010  Job:  [id number]  Electronically Signed by Hannah Beat MD on 12/06/2010 07:03:12 PM

## 2010-12-09 NOTE — H&P (Signed)
NAME:  Tony Boyd, Tony Boyd NO.:  [id number]  MEDICAL RECORD NO.:  [id number]  LOCATION:                                 FACILITY:  PHYSICIAN:  Lonia Blood, M.D.      DATE OF BIRTH:  [birth year]  DATE OF ADMISSION:  09/11/2010 DATE OF DISCHARGE:                             HISTORY & PHYSICAL   PRIMARY CARE PHYSICIAN:  Florentina Jenny, MD  PRESENTING COMPLAINT:  Abdominal pain and confusion.  HISTORY OF PRESENT ILLNESS:  The patient is a 55 year old gentleman with end-stage liver disease and recent hospitalization with intra-abdominal abscess.  He presented, but he was discharged early in June of this year on Augmentin which he completed.  He came to the emergency room with nonspecific generalized abdominal pain, more localized in the epigastric to upper right quadrant region.  Pain was persistent and associated with some confusion.  He has not been taking his medication as of yet. Denied any fever or chills.  Denied any nausea, vomiting, or diarrhea. His abdominal pain is rated as 7/10 and is more vague than anything.  PAST MEDICAL HISTORY:  Significant for alcoholic cirrhosis, history of alcoholism with arthritis, recent admission with abdominal abscesses. He was status post percutaneous drainage and drain removal.  History of hepatitis C, history of liver cirrhosis with Child-Pugh class III, thrombocytopenia, bipolar disorder, anemia of chronic disease, history of esophageal varices, history of hypothyroidism, Korsakoff psychosis, and chronic back pain.  ALLERGIES:  DROPERIDOL, PENICILLIN, and TORADOL.  MEDICATIONS: 1. Alprazolam 1 mg t.i.d. p.r.n. 2. Ibuprofen 200 mg p.r.n. 3. Oxycodone 5 mg p.r.n. 4. Lasix 80 mg daily. 5. Ferrous sulfate 325 mg at bedtime. 6. Folic acid 1 mg daily. 7. Lactulose 50 mL 5 times a day. 8. Multivitamin as needed. 9. Omeprazole 20 mg daily. 10.Risperdal 15 mg twice a day. 11.Aldactone 200 mg daily.  SOCIAL HISTORY:  The  patient is a DNR/DNI.  Occasional drinker now, he used to be heavy alcoholic.  Denied any tobacco use.  He lives alone. Denied any IV drug use.  FAMILY HISTORY:  Denied any family history.  REVIEW OF SYSTEMS:  All systems reviewed so far negative except for generalized pain.  PHYSICAL EXAMINATION:  VITAL SIGNS:  Temperature 98, blood pressure 128/73, pulse 84, respiratory rate 18, and sat is 100%. GENERAL:  He is morbidly obese man, seems confused, very sad, lying down flat.  He is in no acute distress. HEENT:  PERRL.  EOMI.  No significant pallor with some mild jaundice, but no rhinorrhea. NECK:  Supple.  No JVD.  No lymphadenopathy. RESPIRATORY:  He has good air entry bilaterally.  No wheezes.  No rales. No crackles. CARDIOVASCULAR:  He has S1 and S2.  No murmur. ABDOMEN:  Distended with possible ascites, multiple scars, nontender with positive bowel sounds. EXTREMITIES:  No edema, cyanosis, or clubbing. SKIN:  Multiple surgical scars, otherwise no rashes, no ulcers, no petechiae. MUSCULOSKELETAL:  No joint swelling or tenderness.  LABORATORY DATA:  Urinalysis is negative.  Sodium 132, potassium 4.1, chloride 99, BUN 29, creatinine 1.70, glucose 153, and calcium 1.03. White count 4.8, hemoglobin 10.4 with platelet of 51 and normal differentials.  Lipase  is 62.  His ammonia level is 99.  Initial cardiac enzymes are negative.  Head CT without contrast is negative.  Chest x- ray showed no acute findings.  ASSESSMENT:  This is a 55 year old gentleman presenting with what appears to be hepatic encephalopathy and abdominal pain.  With his recent intra-abdominal abscess and possible ascites that is questionable worrisome for possible spontaneous bacterial peritonitis.  PLAN: 1. Hepatic encephalopathy, probably the patient has been noncompliant     or continued to drink.  His ammonia level was normal in June, but     currently is 38.  We will admit him to bed.  Continue with      supervising lactulose intake.  If he is unable to maintain p.o., we     will do a retention enema.  Hydrate the patient mainly supportive     care until his mentation improves. 2. Abdominal pain is vague and nonspecific and he is not tender.     There is no white count or fever.  I doubt that the abdomen is not     tender again, so I doubt if this is spontaneous bacterial     peritonitis, we will observe him closely.  If he spikes a fever or     decompensate, we will get a paracentesis for Gram stain, culture     and sensitivity, and then restart antibiotics. 3. Liver cirrhosis, this is end-stage and advance.  The patient is a     DNR. 4. Alcoholism.  The patient denies active alcoholism.  At this point,     we will observe him closely. 5. History of bipolar disorder.  Again, we will continue his home     medications. 6. GERD.  Continuous PPI. 7. Hypothyroidism.  Continue with levothyroxine.  Other than that further treatment will depend on how the patient responds to our initial measures.     Lonia Blood, M.D.     Verlin Grills  D:  09/11/2010  T:  09/11/2010  Job:  [id number]  Electronically Signed by Lonia Blood M.D. on 12/09/2010 02:47:17 PM

## 2010-12-21 ENCOUNTER — Emergency Department (HOSPITAL_COMMUNITY)
Admission: EM | Admit: 2010-12-21 | Discharge: 2010-12-22 | Disposition: A | Discharge status: Home / Self Care | Payer: Medicare Other | Attending: Emergency Medicine | Admitting: Emergency Medicine

## 2010-12-21 DIAGNOSIS — E039 Hypothyroidism, unspecified: Secondary | ICD-10-CM | POA: Insufficient documentation

## 2010-12-21 DIAGNOSIS — K7030 Alcoholic cirrhosis of liver without ascites: Secondary | ICD-10-CM | POA: Insufficient documentation

## 2010-12-21 DIAGNOSIS — F1096 Alcohol use, unspecified with alcohol-induced persisting amnestic disorder: Secondary | ICD-10-CM | POA: Insufficient documentation

## 2010-12-21 DIAGNOSIS — F1910 Other psychoactive substance abuse, uncomplicated: Secondary | ICD-10-CM | POA: Insufficient documentation

## 2010-12-21 DIAGNOSIS — D696 Thrombocytopenia, unspecified: Secondary | ICD-10-CM | POA: Insufficient documentation

## 2010-12-21 DIAGNOSIS — G40802 Other epilepsy, not intractable, without status epilepticus: Secondary | ICD-10-CM | POA: Insufficient documentation

## 2010-12-21 DIAGNOSIS — E7220 Disorder of urea cycle metabolism, unspecified: Secondary | ICD-10-CM | POA: Insufficient documentation

## 2010-12-21 DIAGNOSIS — Z79899 Other long term (current) drug therapy: Secondary | ICD-10-CM | POA: Insufficient documentation

## 2010-12-21 DIAGNOSIS — I8510 Secondary esophageal varices without bleeding: Secondary | ICD-10-CM | POA: Insufficient documentation

## 2010-12-21 DIAGNOSIS — B1920 Unspecified viral hepatitis C without hepatic coma: Secondary | ICD-10-CM | POA: Insufficient documentation

## 2010-12-21 DIAGNOSIS — F319 Bipolar disorder, unspecified: Secondary | ICD-10-CM | POA: Insufficient documentation

## 2010-12-21 LAB — COMPREHENSIVE METABOLIC PANEL
ALT: 35 U/L (ref 0–53)
AST: 64 U/L — ABNORMAL HIGH (ref 0–37)
Alkaline Phosphatase: 152 U/L — ABNORMAL HIGH (ref 39–117)
GFR calc Af Amer: >90 mL/min (ref >90)
Glucose, Bld: 136 mg/dL — ABNORMAL HIGH (ref 70–99)
Potassium: 3.7 mEq/L (ref 3.5–5.1)
Sodium: 129 mEq/L — ABNORMAL LOW (ref 135–145)
Total Protein: 7.2 g/dL (ref 6.0–8.3)

## 2010-12-21 LAB — CBC
HCT: 30.9 % — ABNORMAL LOW (ref 39.0–52.0)
Hemoglobin: 10.7 g/dL — ABNORMAL LOW (ref 13.0–17.0)
RDW: 16.7 % — ABNORMAL HIGH (ref 11.5–15.5)
WBC: 4.4 10*3/uL (ref 4.0–10.5)

## 2010-12-21 LAB — DIFFERENTIAL
Basophils Absolute: 0 10*3/uL (ref 0.0–0.1)
Eosinophils Relative: 3 % (ref 0–5)
Lymphocytes Relative: 25 % (ref 12–46)
Neutro Abs: 2.7 10*3/uL (ref 1.7–7.7)

## 2011-03-12 ENCOUNTER — Encounter: Payer: Self-pay | Admitting: Gastroenterology

## 2011-03-27 ENCOUNTER — Ambulatory Visit (AMBULATORY_SURGERY_CENTER): Payer: Medicare Other

## 2011-03-27 VITALS — Ht 68.0 in | Wt 253.3 lb

## 2011-03-27 DIAGNOSIS — Z1211 Encounter for screening for malignant neoplasm of colon: Secondary | ICD-10-CM

## 2011-03-27 MED ORDER — PEG-KCL-NACL-NASULF-NA ASC-C 100 G PO SOLR: 1.0000 | Freq: Once | ORAL | Status: AC

## 2011-03-27 NOTE — Progress Notes (Signed)
Spoke with Dr. Christella Hartigan CMA regarding patient's complicated medical history. Per Clayborne Dana (CMA) it was recommended that the patient come into the office to see Dr Christella Hartigan first before having a colonoscopy. I left a message with Tamera Punt (daughter) to call our office. [name] RN

## 2011-03-28 ENCOUNTER — Telehealth: Payer: Self-pay | Admitting: *Deleted

## 2011-03-28 NOTE — Telephone Encounter (Signed)
Pt had PV 03/27/2011 with [name].  Pt has complicated medical history.  Pt will need office visit with Dr. Christella Hartigan before scheduling colonoscopy.  Attempted to call pt at [phone]; was wrong phone number.  Called pt's home phone [phone])  and left message for pt to call to schedule office visit.  [name]

## 2011-03-28 NOTE — Telephone Encounter (Signed)
Spoke with pt's daughter, Tamera Punt.  Scheduled pt for office visit with Dr. Christella Hartigan on 2/13 at 9:15.  Informed daughter that colonoscopy scheduled for 3/6 is cancelled. Tony Boyd

## 2011-04-04 ENCOUNTER — Telehealth: Payer: Self-pay | Admitting: *Deleted

## 2011-04-04 NOTE — Telephone Encounter (Signed)
----   Converted from flag ---- ---- 01/10/2010 10:46 AM, Chales Abrahams CMA (AAMA) wrote:  Revonda Standard can you please cx this appt and let the pt know, Dr Christella Hartigan will not accept.  Thanks  ---- 01/09/2010 6:49 PM, Rachael Fee MD wrote: no, thanks  ---- 01/09/2010 10:16 AM, Chales Abrahams CMA Duncan Dull) wrote: Dr Christella Hartigan this pt no showed for Dr Arlyce Dice 2 times and they put him on your schedule.  Do you want to leave it on ?  04/04/2011-- According to above documentation... Dr. Christella Hartigan does not accept this pt.  At [facility] request, I called this pt's daughter Tamera Punt) and let her know that Dr. Christella Hartigan does not accept this pt because of not showing for 2 previous appointments.  She says that she will call pt's pcp and have him recommend another GI doctor.  OV with Dr. Christella Hartigan scheduled for 3/6 cancelled. [name]

## 2011-04-10 ENCOUNTER — Other Ambulatory Visit: Payer: Medicare Other | Admitting: Gastroenterology

## 2011-04-11 ENCOUNTER — Ambulatory Visit: Payer: Medicare Other | Admitting: Gastroenterology

## 2011-04-18 ENCOUNTER — Inpatient Hospital Stay (HOSPITAL_COMMUNITY)
Admission: EM | Admit: 2011-04-18 | Discharge: 2011-04-23 | DRG: 442 | Disposition: A | Discharge status: Home Health | Payer: Medicare Other | Attending: Internal Medicine | Admitting: Internal Medicine

## 2011-04-18 ENCOUNTER — Encounter (HOSPITAL_COMMUNITY): Payer: Self-pay | Admitting: *Deleted

## 2011-04-18 ENCOUNTER — Emergency Department (HOSPITAL_COMMUNITY): Payer: Medicare Other

## 2011-04-18 DIAGNOSIS — B1920 Unspecified viral hepatitis C without hepatic coma: Secondary | ICD-10-CM | POA: Diagnosis present

## 2011-04-18 DIAGNOSIS — G40909 Epilepsy, unspecified, not intractable, without status epilepticus: Secondary | ICD-10-CM | POA: Diagnosis present

## 2011-04-18 DIAGNOSIS — K7682 Hepatic encephalopathy: Secondary | ICD-10-CM

## 2011-04-18 DIAGNOSIS — E871 Hypo-osmolality and hyponatremia: Secondary | ICD-10-CM

## 2011-04-18 DIAGNOSIS — K7290 Hepatic failure, unspecified without coma: Secondary | ICD-10-CM | POA: Diagnosis present

## 2011-04-18 DIAGNOSIS — D689 Coagulation defect, unspecified: Secondary | ICD-10-CM | POA: Diagnosis present

## 2011-04-18 DIAGNOSIS — Z9119 Patient's noncompliance with other medical treatment and regimen: Secondary | ICD-10-CM

## 2011-04-18 DIAGNOSIS — I498 Other specified cardiac arrhythmias: Secondary | ICD-10-CM | POA: Diagnosis present

## 2011-04-18 DIAGNOSIS — G9340 Encephalopathy, unspecified: Secondary | ICD-10-CM

## 2011-04-18 DIAGNOSIS — D696 Thrombocytopenia, unspecified: Secondary | ICD-10-CM | POA: Diagnosis present

## 2011-04-18 DIAGNOSIS — N390 Urinary tract infection, site not specified: Secondary | ICD-10-CM

## 2011-04-18 DIAGNOSIS — E7220 Disorder of urea cycle metabolism, unspecified: Secondary | ICD-10-CM | POA: Diagnosis present

## 2011-04-18 DIAGNOSIS — E039 Hypothyroidism, unspecified: Secondary | ICD-10-CM | POA: Diagnosis present

## 2011-04-18 DIAGNOSIS — F1021 Alcohol dependence, in remission: Secondary | ICD-10-CM | POA: Diagnosis present

## 2011-04-18 DIAGNOSIS — K7030 Alcoholic cirrhosis of liver without ascites: Secondary | ICD-10-CM | POA: Diagnosis present

## 2011-04-18 DIAGNOSIS — N179 Acute kidney failure, unspecified: Secondary | ICD-10-CM | POA: Diagnosis present

## 2011-04-18 DIAGNOSIS — B182 Chronic viral hepatitis C: Principal | ICD-10-CM | POA: Diagnosis present

## 2011-04-18 DIAGNOSIS — Z6839 Body mass index (BMI) 39.0-39.9, adult: Secondary | ICD-10-CM

## 2011-04-18 DIAGNOSIS — F1010 Alcohol abuse, uncomplicated: Secondary | ICD-10-CM | POA: Diagnosis present

## 2011-04-18 DIAGNOSIS — F319 Bipolar disorder, unspecified: Secondary | ICD-10-CM | POA: Diagnosis present

## 2011-04-18 DIAGNOSIS — Z880 Allergy status to penicillin: Secondary | ICD-10-CM

## 2011-04-18 DIAGNOSIS — Z87891 Personal history of nicotine dependence: Secondary | ICD-10-CM

## 2011-04-18 DIAGNOSIS — Z91199 Patient's noncompliance with other medical treatment and regimen due to unspecified reason: Secondary | ICD-10-CM

## 2011-04-18 HISTORY — DX: Amnestic disorder due to known physiological condition: F04

## 2011-04-18 HISTORY — DX: Hepatic failure, unspecified without coma: K72.90

## 2011-04-18 HISTORY — DX: Coagulation defect, unspecified: D68.9

## 2011-04-18 HISTORY — DX: Personal history of other specified conditions: Z87.898

## 2011-04-18 HISTORY — DX: Thrombocytopenia, unspecified: D69.6

## 2011-04-18 HISTORY — DX: Hepatic encephalopathy: K76.82

## 2011-04-18 HISTORY — DX: Personal history of other diseases of urinary system: Z87.448

## 2011-04-18 HISTORY — DX: Hypo-osmolality and hyponatremia: E87.1

## 2011-04-18 LAB — COMPREHENSIVE METABOLIC PANEL
AST: 154 U/L — ABNORMAL HIGH (ref 0–37)
Albumin: 2.2 g/dL — ABNORMAL LOW (ref 3.5–5.2)
Chloride: 91 mEq/L — ABNORMAL LOW (ref 96–112)
Creatinine, Ser: 1.56 mg/dL — ABNORMAL HIGH (ref 0.50–1.35)
Potassium: 4.2 mEq/L (ref 3.5–5.1)
Total Bilirubin: 5.2 mg/dL — ABNORMAL HIGH (ref 0.3–1.2)

## 2011-04-18 LAB — BASIC METABOLIC PANEL WITH GFR
BUN: 30 mg/dL — ABNORMAL HIGH (ref 6–23)
CO2: 19 meq/L (ref 19–32)
Calcium: 8.1 mg/dL — ABNORMAL LOW (ref 8.4–10.5)
Chloride: 95 meq/L — ABNORMAL LOW (ref 96–112)
Creatinine, Ser: 1.4 mg/dL — ABNORMAL HIGH (ref 0.50–1.35)
GFR calc Af Amer: 63 mL/min — ABNORMAL LOW
GFR calc non Af Amer: 55 mL/min — ABNORMAL LOW
Glucose, Bld: 77 mg/dL (ref 70–99)
Potassium: 4.8 meq/L (ref 3.5–5.1)
Sodium: 123 meq/L — ABNORMAL LOW (ref 135–145)

## 2011-04-18 LAB — DIFFERENTIAL
Basophils Relative: 3 % — ABNORMAL HIGH (ref 0–1)
Eosinophils Relative: 8 % — ABNORMAL HIGH (ref 0–5)
Lymphs Abs: 1.2 10*3/uL (ref 0.7–4.0)
Monocytes Absolute: 1.2 10*3/uL — ABNORMAL HIGH (ref 0.1–1.0)
Neutro Abs: 7.4 10*3/uL (ref 1.7–7.7)

## 2011-04-18 LAB — CBC
HCT: 31.5 % — ABNORMAL LOW (ref 39.0–52.0)
Hemoglobin: 11.8 g/dL — ABNORMAL LOW (ref 13.0–17.0)
MCH: 36.2 pg — ABNORMAL HIGH (ref 26.0–34.0)
MCV: 96.6 fL (ref 78.0–100.0)
RBC: 3.26 MIL/uL — ABNORMAL LOW (ref 4.22–5.81)

## 2011-04-18 LAB — TSH: TSH: 2.43 u[IU]/mL (ref 0.350–4.500)

## 2011-04-18 LAB — URINALYSIS, ROUTINE W REFLEX MICROSCOPIC
Glucose, UA: NEGATIVE mg/dL
Protein, ur: NEGATIVE mg/dL

## 2011-04-18 LAB — PROTIME-INR
INR: 2.46 — ABNORMAL HIGH (ref 0.00–1.49)
Prothrombin Time: 27.1 seconds — ABNORMAL HIGH (ref 11.6–15.2)

## 2011-04-18 LAB — PROCALCITONIN: Procalcitonin: 0.64 ng/mL

## 2011-04-18 LAB — URINE MICROSCOPIC-ADD ON

## 2011-04-18 MED ORDER — SODIUM CHLORIDE 0.9 % IJ SOLN
3.0000 mL | Freq: Two times a day (BID) | INTRAMUSCULAR | Status: DC
  Administered 2011-04-19 – 2011-04-22 (×4): 3 mL via INTRAVENOUS

## 2011-04-18 MED ORDER — CIPROFLOXACIN IN D5W 400 MG/200ML IV SOLN
400.0000 mg | Freq: Once | INTRAVENOUS | Status: AC
  Administered 2011-04-18: 400 mg via INTRAVENOUS
  Filled 2011-04-18: qty 200

## 2011-04-18 MED ORDER — SODIUM CHLORIDE 0.9 % IV SOLN: 250.0000 mL | INTRAVENOUS | Status: DC | PRN

## 2011-04-18 MED ORDER — FOLIC ACID 1 MG PO TABS
1.0000 mg | ORAL_TABLET | Freq: Every day | ORAL | Status: DC
  Administered 2011-04-19 – 2011-04-23 (×5): 1 mg via ORAL
  Filled 2011-04-18 (×5): qty 1

## 2011-04-18 MED ORDER — CIPROFLOXACIN IN D5W 400 MG/200ML IV SOLN
400.0000 mg | Freq: Two times a day (BID) | INTRAVENOUS | Status: DC
  Filled 2011-04-18 (×2): qty 200

## 2011-04-18 MED ORDER — RIFAXIMIN 550 MG PO TABS
550.0000 mg | ORAL_TABLET | Freq: Every day | ORAL | Status: DC
  Filled 2011-04-18: qty 1

## 2011-04-18 MED ORDER — CIPROFLOXACIN IN D5W 400 MG/200ML IV SOLN
400.0000 mg | Freq: Two times a day (BID) | INTRAVENOUS | Status: DC
  Administered 2011-04-18: 400 mg via INTRAVENOUS
  Filled 2011-04-18 (×3): qty 200

## 2011-04-18 MED ORDER — VITAMIN K1 10 MG/ML IJ SOLN
10.0000 mg | Freq: Once | INTRAMUSCULAR | Status: AC
  Administered 2011-04-18: 10 mg via SUBCUTANEOUS
  Filled 2011-04-18: qty 1

## 2011-04-18 MED ORDER — LEVOTHYROXINE SODIUM 75 MCG PO TABS
75.0000 ug | ORAL_TABLET | Freq: Every day | ORAL | Status: DC
  Administered 2011-04-19 – 2011-04-23 (×5): 75 ug via ORAL
  Filled 2011-04-18 (×5): qty 1

## 2011-04-18 MED ORDER — LEVETIRACETAM 500 MG PO TABS
500.0000 mg | ORAL_TABLET | Freq: Two times a day (BID) | ORAL | Status: DC
  Administered 2011-04-18 – 2011-04-23 (×10): 500 mg via ORAL
  Filled 2011-04-18 (×11): qty 1

## 2011-04-18 MED ORDER — SODIUM CHLORIDE 0.9 % IV SOLN
INTRAVENOUS | Status: AC
  Administered 2011-04-18: 21:00:00 via INTRAVENOUS

## 2011-04-18 MED ORDER — PANTOPRAZOLE SODIUM 40 MG PO TBEC
40.0000 mg | DELAYED_RELEASE_TABLET | Freq: Every day | ORAL | Status: DC
  Administered 2011-04-20 – 2011-04-23 (×4): 40 mg via ORAL
  Filled 2011-04-18 (×5): qty 1

## 2011-04-18 MED ORDER — SPIRONOLACTONE 100 MG PO TABS
100.0000 mg | ORAL_TABLET | Freq: Every day | ORAL | Status: DC
  Administered 2011-04-19 – 2011-04-23 (×5): 100 mg via ORAL
  Filled 2011-04-18 (×5): qty 1

## 2011-04-18 MED ORDER — ADULT MULTIVITAMIN W/MINERALS CH
1.0000 | ORAL_TABLET | Freq: Every day | ORAL | Status: DC
  Administered 2011-04-19 – 2011-04-23 (×5): 1 via ORAL
  Filled 2011-04-18 (×5): qty 1

## 2011-04-18 MED ORDER — SODIUM CHLORIDE 0.9 % IJ SOLN: 3.0000 mL | INTRAMUSCULAR | Status: DC | PRN

## 2011-04-18 MED ORDER — ONE-DAILY MULTI VITAMINS PO TABS: 1.0000 | ORAL_TABLET | Freq: Every day | ORAL | Status: DC

## 2011-04-18 MED ORDER — SODIUM CHLORIDE 0.9 % IV SOLN
Freq: Once | INTRAVENOUS | Status: AC
  Administered 2011-04-18: 03:00:00 via INTRAVENOUS

## 2011-04-18 MED ORDER — LACTULOSE 10 GM/15ML PO SOLN
20.0000 g | Freq: Every day | ORAL | Status: DC
  Administered 2011-04-18: 20 g via ORAL
  Filled 2011-04-18 (×2): qty 30

## 2011-04-18 MED ORDER — RISPERIDONE 0.25 MG PO TABS
0.2500 mg | ORAL_TABLET | Freq: Two times a day (BID) | ORAL | Status: DC
  Administered 2011-04-18 – 2011-04-23 (×10): 0.25 mg via ORAL
  Filled 2011-04-18 (×11): qty 1

## 2011-04-18 MED ORDER — LACTULOSE 10 GM/15ML PO SOLN
20.0000 g | Freq: Once | ORAL | Status: AC
  Administered 2011-04-18: 20 g via ORAL
  Filled 2011-04-18: qty 30

## 2011-04-18 MED ORDER — LACTULOSE 10 GM/15ML PO SOLN
20.0000 g | Freq: Every day | ORAL | Status: DC
  Filled 2011-04-18: qty 30

## 2011-04-18 NOTE — ED Notes (Signed)
Pt has lower extremity swelling bilaterally, and hand swelling,  Abdominal distention,  Jaundice skin and sclera

## 2011-04-18 NOTE — ED Notes (Signed)
Vital signs stable. 

## 2011-04-18 NOTE — ED Provider Notes (Signed)
History     CSN: [id number]  Arrival date & time 04/18/11  0014   First MD Initiated Contact with Patient 04/18/11 0032      Chief Complaint  Patient presents with  . Altered Mental Status    pt arrived via EMS  from [facility]  originally pt was in Afib enroute but switched to sinus tach    (Consider location/radiation/quality/duration/timing/severity/associated sxs/prior treatment) HPI Comments: 56 year old male with severe liver failure related to cirrhosis, hepatitis C alcohol abuse. He presents with altered mental status. He states that his daughter called him at home, she is the one that called ambulance because he did not sound right. He denies missing any medications, he is disoriented to place and date but is aware of who he is. We are unsure of the onset of the symptoms, they appear to be persistent, he denies fevers coughing or shortness of breath.  Patient is a 56 y.o. male presenting with altered mental status. The history is provided by the patient, the EMS personnel and medical records.  Altered Mental Status    Past Medical History  Diagnosis Date  . Cirrhosis of liver   . Hepatitis C   . ETOH abuse   . Hypothyroid   . Psychosis   . Bipolar disorder   . Seizures   . Arthritis   . Back pain   . Coagulopathy     Hx of  . Thrombocytopenia     Hx of  . Pancytopenia     Hx of  . H/O hypokalemia   . Hyponatremia     Hx of  . Korsakoff psychosis   . H/O abdominal abscess   . H/O esophageal varices   . Metabolic encephalopathy   . Hepatic encephalopathy   . Urinary urgency   . H/O renal failure   . H/O ascites     Past Surgical History  Procedure Date  . Colostomy   . Cholecystectomy   . Small intestine surgery   . Arm surg     left arm  . US guided drain placement in ventral hernia abscess 07/21/2010  . Multiple picc line placements     Family History  Problem Relation Age of Onset  . Heart disease Mother     History  Substance  Use Topics  . Smoking status: Former Smoker    Types: Cigarettes    Quit date: 02/15/2011  . Smokeless tobacco: Never Used  . Alcohol Use: No      Review of Systems  Unable to perform ROS: Mental status change  Psychiatric/Behavioral: Positive for altered mental status.    Allergies  Droperidol; Ketorolac; and Penicillins  Home Medications   No current outpatient prescriptions on file.  BP 119/62  Pulse 104  Temp(Src) 98.5 F (36.9 C) (Oral)  Resp 20  Ht 5\' 7"  (1.702 m)  Wt 253 lb 11.2 oz (115.078 kg)  BMI 39.74 kg/m2  SpO2 96%  Physical Exam  Nursing note and vitals reviewed. Constitutional:       Somnolent  HENT:  Head: Normocephalic and atraumatic.  Mouth/Throat: Oropharynx is clear and moist. No oropharyngeal exudate.  Eyes: EOM are normal. Pupils are equal, round, and reactive to light. Right eye exhibits no discharge. Left eye exhibits no discharge.  Neck: Normal range of motion. Neck supple. No JVD present. No thyromegaly present.  Cardiovascular: Regular rhythm, normal heart sounds and intact distal pulses.  Exam reveals no gallop and no friction rub.   No murmur heard.  Tachycardia  Pulmonary/Chest: Effort normal and breath sounds normal. No respiratory distress. He has no wheezes. He has no rales.  Abdominal: Soft. Bowel sounds are normal. He exhibits no distension and no mass. There is no tenderness.  Musculoskeletal: Normal range of motion. He exhibits edema (bilateral lower extremity edema). He exhibits no tenderness.  Lymphadenopathy:    He has no cervical adenopathy.  Neurological: Coordination normal.       Patient appears somnolent but is arousable and follows commands, speech is clear when he awakens, memory deficit, disoriented  Skin: Skin is warm and dry. No rash noted. No erythema.  Psychiatric: He has a normal mood and affect. His behavior is normal.    ED Course  Procedures (including critical care time)  Labs Reviewed  AMMONIA -  Abnormal; Notable for the following:    Ammonia 84 (*)    All other components within normal limits  CBC - Abnormal; Notable for the following:    WBC 11.0 (*)    RBC 3.26 (*)    Hemoglobin 11.8 (*)    HCT 31.5 (*)    MCH 36.2 (*)    MCHC 37.5 (*)    Platelets 62 (*)    All other components within normal limits  DIFFERENTIAL - Abnormal; Notable for the following:    Lymphocytes Relative 11 (*)    Eosinophils Relative 8 (*)    Basophils Relative 3 (*)    Monocytes Absolute 1.2 (*)    Eosinophils Absolute 0.9 (*)    Basophils Absolute 0.3 (*)    All other components within normal limits  APTT - Abnormal; Notable for the following:    aPTT 42 (*)    All other components within normal limits  PROTIME-INR - Abnormal; Notable for the following:    Prothrombin Time 27.1 (*)    INR 2.46 (*)    All other components within normal limits  COMPREHENSIVE METABOLIC PANEL - Abnormal; Notable for the following:    Sodium 121 (*)    Chloride 91 (*)    Glucose, Bld 69 (*)    BUN 27 (*)    Creatinine, Ser 1.56 (*)    Albumin 2.2 (*)    AST 154 (*)    ALT 79 (*)    Alkaline Phosphatase 119 (*)    Total Bilirubin 5.2 (*)    GFR calc non Af Amer 48 (*)    GFR calc Af Amer 56 (*)    All other components within normal limits  URINALYSIS, ROUTINE W REFLEX MICROSCOPIC - Abnormal; Notable for the following:    Color, Urine ORANGE (*) BIOCHEMICALS MAY BE AFFECTED BY COLOR   APPearance CLOUDY (*)    Hgb urine dipstick MODERATE (*)    Bilirubin Urine MODERATE (*)    Ketones, ur TRACE (*)    Nitrite POSITIVE (*)    Leukocytes, UA SMALL (*)    All other components within normal limits  LACTIC ACID, PLASMA - Abnormal; Notable for the following:    Lactic Acid, Venous 2.7 (*)    All other components within normal limits  URINE MICROSCOPIC-ADD ON - Abnormal; Notable for the following:    Bacteria, UA MANY (*)    Casts HYALINE CASTS (*)    All other components within normal limits  BASIC  METABOLIC PANEL - Abnormal; Notable for the following:    Sodium 123 (*)    Chloride 95 (*)    BUN 30 (*)    Creatinine, Ser 1.40 (*)  Calcium 8.1 (*)    GFR calc non Af Amer 55 (*)    GFR calc Af Amer 63 (*)    All other components within normal limits  BASIC METABOLIC PANEL - Abnormal; Notable for the following:    Sodium 129 (*)    BUN 29 (*)    Calcium 8.2 (*)    GFR calc non Af Amer 62 (*)    GFR calc Af Amer 71 (*)    All other components within normal limits  CBC - Abnormal; Notable for the following:    RBC 3.42 (*)    Hemoglobin 12.1 (*)    HCT 33.7 (*)    MCH 35.4 (*)    RDW 15.6 (*)    Platelets 60 (*)    All other components within normal limits  COMPREHENSIVE METABOLIC PANEL - Abnormal; Notable for the following:    Sodium 127 (*)    Glucose, Bld 135 (*)    BUN 25 (*)    Calcium 7.8 (*)    Albumin 2.0 (*)    AST 131 (*)    ALT 85 (*)    Alkaline Phosphatase 125 (*)    Total Bilirubin 3.0 (*)    GFR calc non Af Amer 75 (*)    GFR calc Af Amer 87 (*)    All other components within normal limits  URINE CULTURE  PROCALCITONIN  AMMONIA  TSH  MRSA PCR SCREENING  AMMONIA  TSH  CBC  CBC  BASIC METABOLIC PANEL   No results found.   1. Encephalopathy   2. UTI (lower urinary tract infection)   3. Hyponatremia   4. UTI (urinary tract infection)       MDM  Vital signs show tachycardia but no fever, hypoxia or respiratory distress. Patient does have a somnolent appearance and is altered, would imagine this is related to liver failure or infection. Check electrolytes, urinalysis by in and out catheterization sample, chest x-ray, ammonia.  CRITICAL CARE Performed by: Vida Roller   Total critical care time: 35  Critical care time was exclusive of separately billable procedures and treating other patients.  Critical care was necessary to treat or prevent imminent or life-threatening deterioration.  Critical care was time spent personally by me  on the following activities: development of treatment plan with patient and/or surrogate as well as nursing, discussions with consultants, evaluation of patient's response to treatment, examination of patient, obtaining history from patient or surrogate, ordering and performing treatments and interventions, ordering and review of laboratory studies, ordering and review of radiographic studies, pulse oximetry and re-evaluation of patient's condition.    ED Course:  Pt has persistent encephalopathy with ongoing tachycardia   Labarotory results reviewed:  UTI opresent with many bacteria, nitrite positive, WBC of 11, coagulopathy likely from severe liver failure / disease, lactic aid elevated and CMP with multiple abnormalities including severe hyponatremia, and renal insufficiency with elevated LFT's.  Ammonia is 84 which is likely the answer to the progressive change in mental status.  Procalcitonin is in indeterminate range.   Radiology imaging reviewed:  CXR shows hypoexpansion and increased interstitial markings - ? Interstitial edema   Previous Records reviewed: Semi frequent visits to the ED for similar complaints  Medications given in ED: lactulose, Cipro, IVF  The pt and (or) family members were informed of results and need for close follow up  Consultation: [facility] hospitalist  Disposition:  Admit  Vida Roller, MD 04/20/11 [phone]

## 2011-04-18 NOTE — ED Notes (Signed)
Patient denies pain and is resting comfortably.  

## 2011-04-18 NOTE — ED Notes (Addendum)
-    PT STILL VERY AGITATED;   HE CONTINUES TO DISLODGE HIS INT, EVEN WHEN IT'S CONCEALED.    ALSO PULLS OFF HIS EKG LEADS.     MD AWARE.

## 2011-04-18 NOTE — H&P (Signed)
PCP:   REED,TIFFANY L., DO, DO   Chief Complaint: Increased confusion.   HPI: Tony Boyd is an 56 y.o. male with history of liver cirrhosis, prior EtOH abuse, hepatitis C, hypothyroidism, seizure, brought into the emergency room because his mental status has declined. He denies fever, chills, headache, nausea, vomiting, abdominal pain, abdominal distention, black stool or bloody stool. He denied any dysuria. Evaluation in the emergency room shows an elevation of his ammonia level of 86, and a urinalysis showed evidence of UTI. Hospitalist was asked to admit him for treatment of his hepatic encephalopathy and a UTI.  Rewiew of Systems:  The patient denies anorexia, fever, weight loss,, vision loss, decreased hearing, hoarseness, chest pain, syncope, dyspnea on exertion, peripheral edema, balance deficits, hemoptysis, abdominal pain, melena, hematochezia, severe indigestion/heartburn, hematuria, incontinence, genital sores, muscle weakness, suspicious skin lesions, transient blindness, difficulty walking, depression, unusual weight change, abnormal bleeding, enlarged lymph nodes, angioedema, and breast masses.  Past Medical History  Diagnosis Date  . Cirrhosis of liver   . Hepatitis C   . ETOH abuse   . Hypothyroid   . Psychosis   . Bipolar disorder   . Seizures   . Arthritis   . Back pain     Past Surgical History  Procedure Date  . Colostomy   . Cholecystectomy   . Small intestine surgery   . Arm surg     left arm    Medications:  HOME MEDS: Prior to Admission medications   Medication Sig Start Date End Date Taking? Authorizing Provider  ALPRAZolam Prudy Feeler) 1 MG tablet Take 1 mg by mouth 3 (three) times daily.   Yes Historical Provider, MD  folic acid (FOLVITE) 1 MG tablet Take 1 mg by mouth daily.   Yes Historical Provider, MD  furosemide (LASIX) 40 MG tablet Take 40 mg by mouth daily.   Yes Historical Provider, MD  LACTULOSE PO Take 50 mLs by mouth daily. Take 5 times  daily   Yes Historical Provider, MD  levETIRAcetam (KEPPRA) 500 MG tablet Take 500 mg by mouth 2 (two) times daily.   Yes Historical Provider, MD  levothyroxine (SYNTHROID, LEVOTHROID) 75 MCG tablet Take 75 mcg by mouth daily.   Yes Historical Provider, MD  Multiple Vitamin (MULTIVITAMIN) tablet Take 1 tablet by mouth daily.   Yes Historical Provider, MD  omeprazole (PRILOSEC) 20 MG capsule Take 20 mg by mouth daily.   Yes Historical Provider, MD  oxycodone (OXY-IR) 5 MG capsule Take 5 mg by mouth every 4 (four) hours as needed.   Yes Historical Provider, MD  rifaximin (XIFAXAN) 550 MG TABS Take 550 mg by mouth daily.   Yes Historical Provider, MD  risperiDONE (RISPERDAL) 0.25 MG tablet Take 0.25 mg by mouth 2 (two) times daily.   Yes Historical Provider, MD  spironolactone (ALDACTONE) 100 MG tablet Take 100 mg by mouth daily.   Yes Historical Provider, MD     Allergies:  Allergies  Allergen Reactions  . Droperidol   . Ketorolac   . Penicillins     Social History:   reports that he quit smoking about 2 months ago. His smoking use included Cigarettes. He has never used smokeless tobacco. He reports that he does not drink alcohol or use illicit drugs.  Family History: Family History  Problem Relation Age of Onset  . Heart disease Mother      Physical Exam: Filed Vitals:   04/18/11 0018  BP: 106/63  Pulse: 114  Temp: 98.2 F (36.8  C)  TempSrc: Oral  Resp: 20  SpO2: 97%   Blood pressure 106/63, pulse 114, temperature 98.2 F (36.8 C), temperature source Oral, resp. rate 20, SpO2 97.00%.  GEN:  Pleasant person lying in the stretcher in no acute distress; cooperative with exam PSYCH:  He is confused; does not appear anxious does not appear depressed; affect is normal HEENT: Mucous membranes pink and anicteric; PERRLA; EOM intact; no cervical lymphadenopathy nor thyromegaly or carotid bruit; no JVD; Breasts:: Not examined CHEST WALL: No tenderness CHEST: Normal respiration,  clear to auscultation bilaterally HEART: Regular rate and rhythm; no murmurs rubs or gallops. BACK: No kyphosis or scoliosis; no CVA tenderness ABDOMEN: Obese, soft non-tender; no masses, he does have evidence of ascites, but it is not a tense abdomen. Certainly not an acute abdomen. Rectal Exam: Not done EXTREMITIES: No bone or joint deformity; age-appropriate arthropathy of the hands and knees; no edema; no ulcerations. Genitalia: not examined PULSES: 2+ and symmetric SKIN: Normal hydration no rash or ulceration CNS: Limited neurological exam but grossly nonfocal.   Labs & Imaging Results for orders placed during the hospital encounter of 04/18/11 (from the past 48 hour(s))  AMMONIA     Status: Abnormal   Collection Time   04/18/11 12:50 AM      Component Value Range Comment   Ammonia 84 (*) 11 - 60 (umol/L)   CBC     Status: Abnormal   Collection Time   04/18/11 12:50 AM      Component Value Range Comment   WBC 11.0 (*) 4.0 - 10.5 (K/uL)    RBC 3.26 (*) 4.22 - 5.81 (MIL/uL)    Hemoglobin 11.8 (*) 13.0 - 17.0 (g/dL)    HCT 96.0 (*) 45.4 - 52.0 (%)    MCV 96.6  78.0 - 100.0 (fL)    MCH 36.2 (*) 26.0 - 34.0 (pg)    MCHC 37.5 (*) 30.0 - 36.0 (g/dL)    RDW 09.8  11.9 - 14.7 (%)    Platelets 62 (*) 150 - 400 (K/uL)   DIFFERENTIAL     Status: Abnormal   Collection Time   04/18/11 12:50 AM      Component Value Range Comment   Neutrophils Relative 67  43 - 77 (%)    Lymphocytes Relative 11 (*) 12 - 46 (%)    Monocytes Relative 11  3 - 12 (%)    Eosinophils Relative 8 (*) 0 - 5 (%)    Basophils Relative 3 (*) 0 - 1 (%)    Neutro Abs 7.4  1.7 - 7.7 (K/uL)    Lymphs Abs 1.2  0.7 - 4.0 (K/uL)    Monocytes Absolute 1.2 (*) 0.1 - 1.0 (K/uL)    Eosinophils Absolute 0.9 (*) 0.0 - 0.7 (K/uL)    Basophils Absolute 0.3 (*) 0.0 - 0.1 (K/uL)    RBC Morphology TEARDROP CELLS      WBC Morphology TOXIC GRANULATION   ATYPICAL LYMPHOCYTES   Smear Review PLATELET COUNT CONFIRMED BY SMEAR     APTT      Status: Abnormal   Collection Time   04/18/11 12:50 AM      Component Value Range Comment   aPTT 42 (*) 24 - 37 (seconds)   PROTIME-INR     Status: Abnormal   Collection Time   04/18/11 12:50 AM      Component Value Range Comment   Prothrombin Time 27.1 (*) 11.6 - 15.2 (seconds)    INR 2.46 (*) 0.00 -  1.49    COMPREHENSIVE METABOLIC PANEL     Status: Abnormal   Collection Time   04/18/11 12:50 AM      Component Value Range Comment   Sodium 121 (*) 135 - 145 (mEq/L)    Potassium 4.2  3.5 - 5.1 (mEq/L)    Chloride 91 (*) 96 - 112 (mEq/L)    CO2 22  19 - 32 (mEq/L)    Glucose, Bld 69 (*) 70 - 99 (mg/dL)    BUN 27 (*) 6 - 23 (mg/dL)    Creatinine, Ser 4.69 (*) 0.50 - 1.35 (mg/dL)    Calcium 8.4  8.4 - 10.5 (mg/dL)    Total Protein 6.5  6.0 - 8.3 (g/dL)    Albumin 2.2 (*) 3.5 - 5.2 (g/dL)    AST 629 (*) 0 - 37 (U/L)    ALT 79 (*) 0 - 53 (U/L)    Alkaline Phosphatase 119 (*) 39 - 117 (U/L)    Total Bilirubin 5.2 (*) 0.3 - 1.2 (mg/dL)    GFR calc non Af Amer 48 (*) >90 (mL/min)    GFR calc Af Amer 56 (*) >90 (mL/min)   LACTIC ACID, PLASMA     Status: Abnormal   Collection Time   04/18/11 12:50 AM      Component Value Range Comment   Lactic Acid, Venous 2.7 (*) 0.5 - 2.2 (mmol/L)   PROCALCITONIN     Status: Normal   Collection Time   04/18/11 12:50 AM      Component Value Range Comment   Procalcitonin 0.64     URINALYSIS, ROUTINE W REFLEX MICROSCOPIC     Status: Abnormal   Collection Time   04/18/11  1:07 AM      Component Value Range Comment   Color, Urine ORANGE (*) YELLOW  BIOCHEMICALS MAY BE AFFECTED BY COLOR   APPearance CLOUDY (*) CLEAR     Specific Gravity, Urine 1.018  1.005 - 1.030     pH 5.5  5.0 - 8.0     Glucose, UA NEGATIVE  NEGATIVE (mg/dL)    Hgb urine dipstick MODERATE (*) NEGATIVE     Bilirubin Urine MODERATE (*) NEGATIVE     Ketones, ur TRACE (*) NEGATIVE (mg/dL)    Protein, ur NEGATIVE  NEGATIVE (mg/dL)    Urobilinogen, UA 1.0  0.0 - 1.0 (mg/dL)     Nitrite POSITIVE (*) NEGATIVE     Leukocytes, UA SMALL (*) NEGATIVE    URINE MICROSCOPIC-ADD ON     Status: Abnormal   Collection Time   04/18/11  1:07 AM      Component Value Range Comment   WBC, UA 0-2  <3 (WBC/hpf)    RBC / HPF 3-6  <3 (RBC/hpf)    Bacteria, UA MANY (*) RARE     Casts HYALINE CASTS (*) NEGATIVE     Urine-Other AMORPHOUS URATES/PHOSPHATES      Dg Chest Port 1 View  04/18/2011  *RADIOLOGY REPORT*  Clinical Data: Altered mental status.  PORTABLE CHEST - 1 VIEW  Comparison: Chest radiograph performed 10/09/2010  Findings: The lungs are mildly hypoexpanded.  Vascular crowding and vascular congestion are seen, with mildly increased interstitial markings, raising question for minimal interstitial edema.  There is no evidence of pleural effusion or pneumothorax.  The heart is mildly enlarged.  No acute osseous abnormalities are seen.  IMPRESSION: Lungs mildly hypoexpanded; vascular congestion and mild cardiomegaly, with mildly increased interstitial markings, raising question for minimal interstitial edema.  Original Report Authenticated  By: JEFFREY Cherly Hensen, M.D.      Assessment Present on Admission:  .Hepatic encephalopathy .ETOH abuse .Hepatitis C .UTI (urinary tract infection) .Coagulopathy   PLAN: Will admit him to the hospital start him on lactulose along with IV Rocephin. I suspect that the UTI has aggravated his mental status and worsening his hepatic encephalopathy.  There was stopped his Lasix but will continue his spironolactone.  I noted that he has coagulopathy, and I suspect that it is because of his liver failure. I will give vitamin K empirically to see if it will correct. I am not optimistic however. Although he has many serious illnesses, he is hemodynamically stable, and will be admitted to telemetry under [facility] hospital[facility] service.  Other plans as per orders.   Devina Bezold 04/18/2011, 5:00 AM

## 2011-04-18 NOTE — ED Notes (Signed)
Pt's heart rate increased to 187, Dr Conley Rolls called and advised,  Place on telemetry floor.  Pt's heart rate decreased to 99 within a few minutes,  He was not symptomatic with rate change

## 2011-04-18 NOTE — ED Notes (Signed)
VO per Dr. Conley Rolls to obtain telemetry bed for pt

## 2011-04-18 NOTE — Progress Notes (Signed)
Patient admitted this morning with AMS secondary to hepatic encephalopathy.  Ammonia level elevated.  Patient is doing much better now.  He is able to tell me the name of the president.  Will resume diet continue Lactulose and recheck ammonia level in the morning.  Chart reviewed and patient examined.

## 2011-04-18 NOTE — ED Notes (Signed)
Patient is resting comfortably. 

## 2011-04-19 LAB — URINE CULTURE
Colony Count: NO GROWTH
Culture  Setup Time: 201302200515
Culture: NO GROWTH

## 2011-04-19 LAB — BASIC METABOLIC PANEL
CO2: 21 mEq/L (ref 19–32)
Calcium: 8.2 mg/dL — ABNORMAL LOW (ref 8.4–10.5)
GFR calc Af Amer: 71 mL/min — ABNORMAL LOW (ref >90)
Sodium: 129 mEq/L — ABNORMAL LOW (ref 135–145)

## 2011-04-19 LAB — CBC
Hemoglobin: 12.1 g/dL — ABNORMAL LOW (ref 13.0–17.0)
MCHC: 35.9 g/dL (ref 30.0–36.0)
Platelets: 60 10*3/uL — ABNORMAL LOW (ref 150–400)
RDW: 15.6 % — ABNORMAL HIGH (ref 11.5–15.5)

## 2011-04-19 LAB — AMMONIA: Ammonia: 36 umol/L (ref 11–60)

## 2011-04-19 LAB — MRSA PCR SCREENING: MRSA by PCR: NEGATIVE

## 2011-04-19 MED ORDER — RIFAXIMIN 550 MG PO TABS
550.0000 mg | ORAL_TABLET | Freq: Two times a day (BID) | ORAL | Status: DC
  Administered 2011-04-19 – 2011-04-23 (×9): 550 mg via ORAL
  Filled 2011-04-19 (×10): qty 1

## 2011-04-19 MED ORDER — LACTULOSE 10 GM/15ML PO SOLN
20.0000 g | Freq: Two times a day (BID) | ORAL | Status: DC
  Administered 2011-04-19 – 2011-04-20 (×3): 20 g via ORAL
  Filled 2011-04-19 (×4): qty 30

## 2011-04-19 NOTE — Progress Notes (Signed)
CARE MANAGEMENT NOTE 04/19/2011  Patient:  Tony Boyd   Account Number:  [id number]  Date Initiated:  04/19/2011  Documentation initiated by:  Tony Boyd  Subjective/Objective Assessment:   pt admitted due to confusion and hx of etoh abuse and endocrine problems     Action/Plan:   lives at home   Anticipated DC Date:  04/22/2011   Anticipated DC Plan:  ASSISTED LIVING / REST HOME         Choice offered to / List presented to:             Status of service:  In process, will continue to follow Medicare Important Message given?   (If response is "NO", the following Medicare IM given date fields will be blank) Date Medicare IM given:   Date Additional Medicare IM given:    Discharge Disposition:    Per UR Regulation:  Reviewed for med. necessity/level of care/duration of stay  Comments:  [id number]/Tony Wagley,RN,BSN,CCM

## 2011-04-19 NOTE — Progress Notes (Signed)
PROGRESS NOTE  Tony Boyd NWG:[id number] DOB: 22-Jan-1956 DOA: 04/18/2011 PCP: REED,TIFFANY L., DO, DO  Brief narrative: 56 year old man with a history of cirrhosis, alcohol abuse, hepatitis C, seizure brought to the emergency department for altered mental status. Was found to have hyperammonemia and a urinary tract infection.  Past medical history: Cirrhosis, hepatitis C, alcohol abuse, hypothyroidism, bipolar disorder, psychosis, seizure  Consultants:  None  Procedures:  None  Antibiotics:  February 20: Ciprofloxacin  Interim History: Interval documentation reviewed. Patient was noted to be better yesterday evening. Vital signs stable.  Subjective: Denies complaints today. No pain. Confused.  Objective: Filed Vitals:   04/18/11 1410 04/18/11 1854 04/18/11 2201 04/19/11 0516  BP: 123/59 120/61 117/70 127/61  Pulse: 106 98 103 98  Temp: 97.7 F (36.5 C) 98.1 F (36.7 C) 97.5 F (36.4 C) 97.6 F (36.4 C)  TempSrc: Oral Axillary Oral Oral  Resp: 18 18 19 20   Height:    5\' 7"  (1.702 m)  Weight:  115.44 kg (254 lb 8 oz)    SpO2: 97% 98% 98% 99%    Intake/Output Summary (Last 24 hours) at 04/19/11 0834 Last data filed at 04/19/11 0520  Gross per 24 hour  Intake    200 ml  Output    475 ml  Net   -275 ml    Exam:   General:  Calm and comfortable. Confused.  Eyes: Pupils equal, round, reactive to light. Normal lids.  ENT: Grossly normal hearing. Lips appear unremarkable.  Cardiovascular: Tachycardic rate, normal rhythm. No murmur, rub, gallop. No lower extremity edema.  Telemetry: Sinus tachycardia. No arrhythmias.  Respiratory: Clear to auscultation bilaterally. No wheezes, rales, rhonchi. Normal respiratory effort.  Abdomen: Soft, obese. Nontender.  Skin: Grossly unremarkable.  Musculoskeletal: Follows commands. Grossly normal tone and strength upper and lower extremities.  Psychiatric: Alert. Oriented to self only.  Neurologic: Nonfocal.  Data  Reviewed: Basic Metabolic Panel:  Lab 04/19/11 5784 04/18/11 1720 04/18/11 0050  NA 129* 123* 121*  K 4.9 4.8 --  CL 97 95* 91*  CO2 21 19 22   GLUCOSE 95 77 69*  BUN 29* 30* 27*  CREATININE 1.27 1.40* 1.56*  CALCIUM 8.2* 8.1* 8.4  MG -- -- --  PHOS -- -- --   Liver Function Tests:  Lab 04/18/11 0050  AST 154*  ALT 79*  ALKPHOS 119*  BILITOT 5.2*  PROT 6.5  ALBUMIN 2.2*    Lab 04/19/11 0530 04/18/11 0050  AMMONIA 36 84*   CBC:  Lab 04/19/11 0645 04/18/11 0050  WBC 7.3 11.0*  NEUTROABS -- 7.4  HGB 12.1* 11.8*  HCT 33.7* 31.5*  MCV 98.5 96.6  PLT 60* 62*    Recent Results (from the past 240 hour(s))  URINE CULTURE     Status: Normal   Collection Time   04/18/11  1:07 AM      Component Value Range Status Comment   Specimen Description URINE, CATHETERIZED   Final    Special Requests NONE   Final    Culture  Setup Time [id number]   Final    Colony Count NO GROWTH   Final    Culture NO GROWTH   Final    Report Status 04/19/2011 FINAL   Final      Studies: Dg Chest Port 1 View  04/18/2011  *RADIOLOGY REPORT*  Clinical Data: Altered mental status.  PORTABLE CHEST - 1 VIEW  Comparison: Chest radiograph performed 10/09/2010  Findings: The lungs are mildly hypoexpanded.  Vascular crowding and  vascular congestion are seen, with mildly increased interstitial markings, raising question for minimal interstitial edema.  There is no evidence of pleural effusion or pneumothorax.  The heart is mildly enlarged.  No acute osseous abnormalities are seen.  IMPRESSION: Lungs mildly hypoexpanded; vascular congestion and mild cardiomegaly, with mildly increased interstitial markings, raising question for minimal interstitial edema.  Original Report Authenticated By: Tonia Ghent, M.D.    Scheduled Meds:   . sodium chloride   Intravenous STAT  . ciprofloxacin  400 mg Intravenous Q12H  . folic acid  1 mg Oral Daily  . lactulose  20 g Oral Daily  . levETIRAcetam  500 mg Oral BID    . levothyroxine  75 mcg Oral Daily  . mulitivitamin with minerals  1 tablet Oral Daily  . pantoprazole  40 mg Oral Q1200  . rifaximin  550 mg Oral Daily  . risperiDONE  0.25 mg Oral BID  . sodium chloride  3 mL Intravenous Q12H  . spironolactone  100 mg Oral Daily  . DISCONTD: ciprofloxacin  400 mg Intravenous Q12H  . DISCONTD: lactulose  20 g Oral Daily  . DISCONTD: multivitamin  1 tablet Oral Daily   Continuous Infusions:    Assessment/Plan: 1. Hepatic encephalopathy: Persists although ammonia has normalized. Afebrile. Urinalysis was equivocal and there is no other evidence to suggest infection. Signs and symptoms most consistent with hepatic encephalopathy. Increase lactulose. Continue rifaximin. Consider GI consultation if fails to improve. Mild elevation of lactate on admission of unclear significance. 2. Hyperammonemia: Resolved. 3. Abnormal urinalysis: Urinalysis suggestive but not definitive. Urine culture showed no growth and was collected before antibiotic administration. Discontinue ciprofloxacin. 4. Acute renal failure: Appears resolved. 5. Hyponatremia: Improved without specific treatment. Fluid restriction.. Chronic, most likely secondary to cirrhosis. May have played oral in altered mental status. Check TSH. Not on any obvious offending medications. 6. Cirrhosis with coagulopathy: Long-standing. Noted as early as May 2012. 7. Chronic thrombocytopenia: Stable. No heparin products. 8. Hypothyroidism: Continue replacement therapy. Followup TSH. 9. Bipolar disorder/psychosis: Continue current therapy with respiratory. Does not appear to be definitively playing a role.  Code Status: Full code Family Communication:  Disposition Plan: Return to skilled nursing facility when improved.   Brendia Sacks, MD  [facility] Pager [phone] 04/19/2011, 8:34 AM    LOS: 1 day

## 2011-04-19 NOTE — Progress Notes (Addendum)
CSW spoke with patient's daughter, [name] (cell#: [phone]) re: discharge planning. Patient had been living at [facility] (senior living apartments). Daughter stated that he had been to [facility] & [facility] in the past and that they are looking into an Assisted Living Facilities for him for long term, but didn't want to rush into it.  He plans to return to his apartment at discharge.CSW left list of facilities at patient's bedside for daughter per her request. RNCM made aware.   Unice Bailey, LCSWA [phone]

## 2011-04-20 LAB — COMPREHENSIVE METABOLIC PANEL
ALT: 85 U/L — ABNORMAL HIGH (ref 0–53)
AST: 131 U/L — ABNORMAL HIGH (ref 0–37)
Albumin: 2 g/dL — ABNORMAL LOW (ref 3.5–5.2)
Alkaline Phosphatase: 125 U/L — ABNORMAL HIGH (ref 39–117)
BUN: 25 mg/dL — ABNORMAL HIGH (ref 6–23)
CO2: 22 mEq/L (ref 19–32)
Calcium: 7.8 mg/dL — ABNORMAL LOW (ref 8.4–10.5)
Chloride: 100 mEq/L (ref 96–112)
Creatinine, Ser: 1.08 mg/dL (ref 0.50–1.35)
GFR calc Af Amer: 87 mL/min — ABNORMAL LOW (ref >90)
GFR calc non Af Amer: 75 mL/min — ABNORMAL LOW (ref >90)
Glucose, Bld: 135 mg/dL — ABNORMAL HIGH (ref 70–99)
Potassium: 4.5 mEq/L (ref 3.5–5.1)
Sodium: 127 mEq/L — ABNORMAL LOW (ref 135–145)
Total Bilirubin: 3 mg/dL — ABNORMAL HIGH (ref 0.3–1.2)
Total Protein: 7 g/dL (ref 6.0–8.3)

## 2011-04-20 LAB — TSH: TSH: 3.757 u[IU]/mL (ref 0.350–4.500)

## 2011-04-20 LAB — AMMONIA: Ammonia: 52 umol/L (ref 11–60)

## 2011-04-20 MED ORDER — OXYCODONE HCL 5 MG PO TABS
5.0000 mg | ORAL_TABLET | ORAL | Status: AC | PRN
  Administered 2011-04-20 – 2011-04-21 (×3): 5 mg via ORAL
  Filled 2011-04-20 (×3): qty 1

## 2011-04-20 MED ORDER — LACTULOSE 10 GM/15ML PO SOLN
20.0000 g | Freq: Four times a day (QID) | ORAL | Status: DC
  Administered 2011-04-20 – 2011-04-21 (×3): 20 g via ORAL
  Filled 2011-04-20 (×6): qty 30

## 2011-04-20 MED ORDER — ALPRAZOLAM 0.5 MG PO TABS
0.5000 mg | ORAL_TABLET | Freq: Once | ORAL | Status: AC
  Administered 2011-04-20: 0.5 mg via ORAL
  Filled 2011-04-20: qty 1

## 2011-04-20 NOTE — Progress Notes (Signed)
I received bedside report and assumed care of patient at 2300. I agree with Eden Lathe, RN's assessment.  Tony Boyd, [name]

## 2011-04-20 NOTE — Clinical Documentation Improvement (Signed)
BMI DOCUMENTATION CLARIFICATION QUERY  THIS DOCUMENT IS NOT A PERMANENT PART OF THE MEDICAL RECORD  TO RESPOND TO THE THIS QUERY, FOLLOW THE INSTRUCTIONS BELOW:  1. If needed, update documentation for the patient's encounter via the notes activity.  2. Access this query again and click edit on the In [employer].  3. After updating, or not, click F2 to complete all highlighted (required) fields concerning your review. Select "additional documentation in the medical record" OR "no additional documentation provided".  4. Click Sign note button.  5. The deficiency will fall out of your In Basket *Please let us know if you are not able to complete this workflow by phone or e-mail (listed below).         04/20/11  Dear Dr. Betti Cruz, Kathie Tony Boyd  In an effort to better capture your patient's severity of illness, reflect appropriate length of stay and utilization of resources, a review of the patient medical record has revealed the following indicators.    Based on your clinical judgment, please clarify and document in a progress note and/or discharge summary the clinical condition associated with the following supporting information:  In responding to this query please exercise your independent judgment.  The fact that a query is asked, does not imply that any particular answer is desired or expected.  Pt's BMI=   40.   Please clarify whether or not BMI can be linked to one of he diagnoses listed below and document in pn  and d/c. Thank You!  [facility]: When linking BMI to a diagnosis please document both BMI and diagnosis together in pn for accuracy of SOI and ROM.    Possible Clinical conditions  Morbid Obesity W/ BMI=   Underweight w/BMI=  Other condition___________________  Cannot Clinically determine _____________  Risk Factors: Sign & Symptoms: BMI-40 5'7"/255lbs   Treatment Heart Diet    Reviewed: additional documentation in the medical record  Thank  You,  Enis Slipper  RN, BSN, CCDS Clinical Documentation Specialist [facility] Dept Pager: [phone] / E-mail: [email or web address]   Health Information Management [place]

## 2011-04-20 NOTE — Progress Notes (Signed)
Subjective: Patient's mentation is improved but still confused.  Denies any pain.  Objective: Vital signs in last 24 hours: Filed Vitals:   04/19/11 1300 04/19/11 2110 04/20/11 0414 04/20/11 1436  BP: 133/70 115/66 121/69 119/62  Pulse: 98 99 92 104  Temp: 97.8 F (36.6 C) 97.9 F (36.6 C) 97.7 F (36.5 C) 98.5 F (36.9 C)  TempSrc: Oral Oral Oral Oral  Resp: 18 18 18 20   Height:      Weight:   115.078 kg (253 lb 11.2 oz)   SpO2: 96% 97% 98% 96%   Weight change: 0.227 kg (8 oz)  Intake/Output Summary (Last 24 hours) at 04/20/11 1554 Last data filed at 04/20/11 1500  Gross per 24 hour  Intake    560 ml  Output   1475 ml  Net   -915 ml    Physical Exam: General: Awake, Oriented to self, month and year, No acute distress. HEENT: EOMI, has scleral icterus. Neck: Supple CV: S1 and S2 Lungs: Clear to ascultation bilaterally Abdomen: Soft, Nontender, Nondistended, +bowel sounds. Ext: Good pulses. Trace edema. Skin: Multiple spider angiomas noted.  Lab Results:  Basename 04/20/11 0509 04/19/11 0500  NA 127* 129*  K 4.5 4.9  CL 100 97  CO2 22 21  GLUCOSE 135* 95  BUN 25* 29*  CREATININE 1.08 1.27  CALCIUM 7.8* 8.2*  MG -- --  PHOS -- --    Basename 04/20/11 0509 04/18/11 0050  AST 131* 154*  ALT 85* 79*  ALKPHOS 125* 119*  BILITOT 3.0* 5.2*  PROT 7.0 6.5  ALBUMIN 2.0* 2.2*   No results found for this basename: LIPASE:2,AMYLASE:2 in the last 72 hours  Basename 04/19/11 0645 04/18/11 0050  WBC 7.3 11.0*  NEUTROABS -- 7.4  HGB 12.1* 11.8*  HCT 33.7* 31.5*  MCV 98.5 96.6  PLT 60* 62*   No results found for this basename: CKTOTAL:3,CKMB:3,CKMBINDEX:3,TROPONINI:3 in the last 72 hours No components found with this basename: POCBNP:3 No results found for this basename: DDIMER:2 in the last 72 hours No results found for this basename: HGBA1C:2 in the last 72 hours No results found for this basename: CHOL:2,HDL:2,LDLCALC:2,TRIG:2,CHOLHDL:2,LDLDIRECT:2 in the  last 72 hours  Basename 04/20/11 0509  TSH 3.757  T4TOTAL --  T3FREE --  THYROIDAB --   No results found for this basename: VITAMINB12:2,FOLATE:2,FERRITIN:2,TIBC:2,IRON:2,RETICCTPCT:2 in the last 72 hours  Micro Results: Recent Results (from the past 240 hour(s))  URINE CULTURE     Status: Normal   Collection Time   04/18/11  1:07 AM      Component Value Range Status Comment   Specimen Description URINE, CATHETERIZED   Final    Special Requests NONE   Final    Culture  Setup Time [id number]   Final    Colony Count NO GROWTH   Final    Culture NO GROWTH   Final    Report Status 04/19/2011 FINAL   Final   MRSA PCR SCREENING     Status: Normal   Collection Time   04/19/11 10:16 AM      Component Value Range Status Comment   MRSA by PCR NEGATIVE  NEGATIVE  Final     Studies/Results: No results found.  Medications: I have reviewed the patient's current medications. Scheduled Meds:   . folic acid  1 mg Oral Daily  . lactulose  20 g Oral BID  . levETIRAcetam  500 mg Oral BID  . levothyroxine  75 mcg Oral Daily  . mulitivitamin with minerals  1 tablet Oral Daily  . pantoprazole  40 mg Oral Q1200  . rifaximin  550 mg Oral BID  . risperiDONE  0.25 mg Oral BID  . sodium chloride  3 mL Intravenous Q12H  . spironolactone  100 mg Oral Daily   Continuous Infusions:  PRN Meds:.sodium chloride, sodium chloride  Assessment/Plan: 1. Hepatic encephalopathy: Improved.  Continue lactulose, increase to 4 times a day.  Afebrile. Urinalysis was equivocal and there is no other evidence to suggest infection. Signs and symptoms most consistent with hepatic encephalopathy.  2. Hyperammonemia: Resolved. 3. Abnormal urinalysis: Urinalysis suggestive but not definitive. Urine culture showed no growth and was collected before antibiotic administration. Discontinued ciprofloxacin. 4. Acute renal failure: Resolved. 5. Hyponatremia: Stable without specific treatment. Fluid restriction. Chronic,  most likely secondary to cirrhosis. May have played oral in altered mental status. Check TSH. Not on any obvious offending medications. 6. Cirrhosis with coagulopathy: Long-standing. Noted as early as May 2012.  From hepatitis C and history of alcohol abuse. 7. Chronic thrombocytopenia: Stable. No heparin products. 8. Hypothyroidism: Continue replacement therapy. Followup TSH. 9. Bipolar disorder/psychosis: Continue current therapy with risperidone. Does not appear to be definitively playing a role. 10. Morbid obesity: Diet and exercise with management as outpatient. 11. Disposition: Spoke with the patient's daughter, if the patient continues to improve will plan for discharge to independent living facility in 1-2 days.   LOS: 2 days  Robbin Escher A, MD 04/20/2011, 3:54 PM

## 2011-04-20 NOTE — Progress Notes (Signed)
   CARE MANAGEMENT NOTE 04/20/2011  Patient:  Tony Boyd, Tony Boyd   Account Number:  [id number]  Date Initiated:  04/19/2011  Documentation initiated by:  DAVIS,RHONDA  Subjective/Objective Assessment:   pt admitted due to confusion and hx of etoh abuse and endocrine problems     Action/Plan:   lives at home   Anticipated DC Date:  04/22/2011   Anticipated DC Plan:  HOME W HOME HEALTH SERVICES         [facility] Choice  Resumption Of Svcs/PTA Provider   Choice offered to / List presented to:             Status of service:  In process, will continue to follow Medicare Important Message given?   (If response is "NO", the following Medicare IM given date fields will be blank) Date Medicare IM given:   Date Additional Medicare IM given:    Discharge Disposition:    Per UR Regulation:  Reviewed for med. necessity/level of care/duration of stay  Comments:  04/20/11 Sheza Strickland RN,BSN NCM [phone] SPOE TO DTR ABOUT D/C PLANS [name]-PATIENT IS AT [facility]-HAS CANE, RW.ACTIVE W/[facility] 1XWK.SPOKE TO MARY(LIASON) INFORMED OF ADMISSION & FOLLOWING FOR HHC NEEDS.NOTED PT/OT EVAL. MAY NEED TRANSP-CSW NOTIFIED. [id number] DAvis,RN,BSN,CCM

## 2011-04-21 LAB — BASIC METABOLIC PANEL
BUN: 17 mg/dL (ref 6–23)
Calcium: 7.7 mg/dL — ABNORMAL LOW (ref 8.4–10.5)
Creatinine, Ser: 0.9 mg/dL (ref 0.50–1.35)
Creatinine, Ser: 0.84 mg/dL (ref 0.50–1.35)
GFR calc Af Amer: >90 mL/min (ref >90)
GFR calc Af Amer: >90 mL/min (ref >90)
GFR calc non Af Amer: >90 mL/min (ref >90)
Potassium: 5.7 mEq/L — ABNORMAL HIGH (ref 3.5–5.1)
Sodium: 125 mEq/L — ABNORMAL LOW (ref 135–145)

## 2011-04-21 LAB — CBC
HCT: 30 % — ABNORMAL LOW (ref 39.0–52.0)
MCHC: 35.7 g/dL (ref 30.0–36.0)
MCV: 100.7 fL — ABNORMAL HIGH (ref 78.0–100.0)
Platelets: 50 10*3/uL — ABNORMAL LOW (ref 150–400)
RDW: 15.9 % — ABNORMAL HIGH (ref 11.5–15.5)

## 2011-04-21 MED ORDER — FUROSEMIDE 40 MG PO TABS
40.0000 mg | ORAL_TABLET | Freq: Every day | ORAL | Status: DC
  Administered 2011-04-21 – 2011-04-23 (×3): 40 mg via ORAL
  Filled 2011-04-21 (×3): qty 1

## 2011-04-21 MED ORDER — DIPHENHYDRAMINE HCL 25 MG PO CAPS
25.0000 mg | ORAL_CAPSULE | Freq: Four times a day (QID) | ORAL | Status: DC | PRN
  Filled 2011-04-21: qty 1

## 2011-04-21 MED ORDER — LACTULOSE 10 GM/15ML PO SOLN
30.0000 g | Freq: Four times a day (QID) | ORAL | Status: DC
  Administered 2011-04-21 – 2011-04-23 (×8): 30 g via ORAL
  Filled 2011-04-21 (×11): qty 45

## 2011-04-21 NOTE — Progress Notes (Signed)
Subjective: Patient's mentation is improved but still confused.  Denies any pain.  Spoke with the daughter who stated that the patient is not quite at baseline.  Objective: Vital signs in last 24 hours: Filed Vitals:   04/20/11 0414 04/20/11 1436 04/20/11 2230 04/21/11 0550  BP: 121/69 119/62 130/71 126/71  Pulse: 92 104 99 92  Temp: 97.7 F (36.5 C) 98.5 F (36.9 C) 98.2 F (36.8 C) 98.2 F (36.8 C)  TempSrc: Oral Oral Oral Oral  Resp: 18 20 20 18   Height:      Weight: 115.078 kg (253 lb 11.2 oz)   114.4 kg (252 lb 3.3 oz)  SpO2: 98% 96% 96% 96%   Weight change: -1.267 kg (-2 lb 12.7 oz)  Intake/Output Summary (Last 24 hours) at 04/21/11 1308 Last data filed at 04/21/11 4098  Gross per 24 hour  Intake    680 ml  Output    601 ml  Net     79 ml    Physical Exam: General: Awake, Oriented to self, location, month and year, No acute distress. HEENT: EOMI, has scleral icterus. Neck: Supple CV: S1 and S2 Lungs: Clear to ascultation bilaterally Abdomen: Soft, Nontender, Nondistended, +bowel sounds. Ext: Good pulses. Trace edema. Skin: Multiple spider angiomas noted.  Lab Results:  [facility] 04/21/11 1208 04/21/11 0539  NA 125* 126*  K 5.1 5.7*  CL 100 100  CO2 24 23  GLUCOSE 210* 122*  BUN 15 17  CREATININE 0.84 0.90  CALCIUM 7.7* 7.8*  MG -- --  PHOS -- --    Basename 04/20/11 0509  AST 131*  ALT 85*  ALKPHOS 125*  BILITOT 3.0*  PROT 7.0  ALBUMIN 2.0*   No results found for this basename: LIPASE:2,AMYLASE:2 in the last 72 hours  Basename 04/21/11 0539 04/19/11 0645  WBC 4.3 7.3  NEUTROABS -- --  HGB 10.7* 12.1*  HCT 30.0* 33.7*  MCV 100.7* 98.5  PLT 50* 60*   No results found for this basename: CKTOTAL:3,CKMB:3,CKMBINDEX:3,TROPONINI:3 in the last 72 hours No components found with this basename: POCBNP:3 No results found for this basename: DDIMER:2 in the last 72 hours No results found for this basename: HGBA1C:2 in the last 72 hours No results  found for this basename: CHOL:2,HDL:2,LDLCALC:2,TRIG:2,CHOLHDL:2,LDLDIRECT:2 in the last 72 hours  Basename 04/20/11 0509  TSH 3.757  T4TOTAL --  T3FREE --  THYROIDAB --   No results found for this basename: VITAMINB12:2,FOLATE:2,FERRITIN:2,TIBC:2,IRON:2,RETICCTPCT:2 in the last 72 hours  Micro Results: Recent Results (from the past 240 hour(s))  URINE CULTURE     Status: Normal   Collection Time   04/18/11  1:07 AM      Component Value Range Status Comment   Specimen Description URINE, CATHETERIZED   Final    Special Requests NONE   Final    Culture  Setup Time [id number]   Final    Colony Count NO GROWTH   Final    Culture NO GROWTH   Final    Report Status 04/19/2011 FINAL   Final   MRSA PCR SCREENING     Status: Normal   Collection Time   04/19/11 10:16 AM      Component Value Range Status Comment   MRSA by PCR NEGATIVE  NEGATIVE  Final     Studies/Results: No results found.  Medications: I have reviewed the patient's current medications. Scheduled Meds:    . ALPRAZolam  0.5 mg Oral Once  . folic acid  1 mg Oral Daily  .  furosemide  40 mg Oral Daily  . lactulose  30 g Oral QID  . levETIRAcetam  500 mg Oral BID  . levothyroxine  75 mcg Oral Daily  . mulitivitamin with minerals  1 tablet Oral Daily  . pantoprazole  40 mg Oral Q1200  . rifaximin  550 mg Oral BID  . risperiDONE  0.25 mg Oral BID  . sodium chloride  3 mL Intravenous Q12H  . spironolactone  100 mg Oral Daily  . DISCONTD: lactulose  20 g Oral BID  . DISCONTD: lactulose  20 g Oral QID   Continuous Infusions:  PRN Meds:.sodium chloride, diphenhydrAMINE, oxyCODONE, sodium chloride  Assessment/Plan: 1. Hepatic encephalopathy: Improving, not quite at baseline.  Continue lactulose.  Afebrile. Urinalysis was equivocal and there is no other evidence to suggest infection. Signs and symptoms most consistent with hepatic encephalopathy.  Continue to hold patient's xanax and oxycodone.  If no improvement could  consider psychiatric evaluation to see if his psych medications need to be adjusted. 2. Hyperammonemia: Resolved. 3. Abnormal urinalysis: Urinalysis suggestive but not definitive. Urine culture showed no growth and was collected before antibiotic administration. Discontinued ciprofloxacin. 4. Acute renal failure: Resolved. 5. Hyponatremia: Stable without specific treatment. Fluid restriction. Chronic, most likely secondary to cirrhosis. May have played oral in altered mental status.  TSH normal at 3.757. Not on any obvious offending medications. 6. Cirrhosis with coagulopathy: Long-standing. Noted as early as May 2012.  From hepatitis C and history of alcohol abuse. 7. Chronic thrombocytopenia: Stable. No heparin products. 8. Hypothyroidism: Continue replacement therapy.  TSH normal. 9. Bipolar disorder/psychosis: Continue current therapy with risperidone. Does not appear to be definitively playing a role. 10. Morbid obesity: Diet and exercise with management as outpatient. 11. Disposition: Pending, patient not safe for discharged to independent living facility, may need PT/OT evaluation to determine if patient needs rehabilitation prior to going to independent living facility.   LOS: 3 days  Dontel Harshberger A, MD 04/21/2011, 1:08 PM

## 2011-04-22 LAB — CBC
Platelets: 51 10*3/uL — ABNORMAL LOW (ref 150–400)
RDW: 15.5 % (ref 11.5–15.5)
WBC: 5.2 10*3/uL (ref 4.0–10.5)

## 2011-04-22 LAB — BASIC METABOLIC PANEL
Chloride: 100 mEq/L (ref 96–112)
Creatinine, Ser: 0.88 mg/dL (ref 0.50–1.35)
GFR calc Af Amer: >90 mL/min (ref >90)
Potassium: 4.7 mEq/L (ref 3.5–5.1)
Sodium: 127 mEq/L — ABNORMAL LOW (ref 135–145)

## 2011-04-22 MED ORDER — OXYCODONE HCL 5 MG PO TABS
2.5000 mg | ORAL_TABLET | Freq: Four times a day (QID) | ORAL | Status: DC | PRN
  Administered 2011-04-22 (×2): 2.5 mg via ORAL
  Filled 2011-04-22 (×4): qty 1

## 2011-04-22 MED ORDER — OXYCODONE HCL 5 MG PO CAPS: 2.5000 mg | ORAL_CAPSULE | Freq: Four times a day (QID) | ORAL | Status: DC | PRN

## 2011-04-22 MED ORDER — ALPRAZOLAM 0.25 MG PO TABS
0.2500 mg | ORAL_TABLET | Freq: Three times a day (TID) | ORAL | Status: DC | PRN
  Administered 2011-04-22 – 2011-04-23 (×2): 0.25 mg via ORAL
  Filled 2011-04-22 (×2): qty 1

## 2011-04-22 NOTE — Plan of Care (Signed)
Problem: Consults Goal: UTI/Pyelonephritis Patient Education See Patient Education Module for education specifics.  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

## 2011-04-22 NOTE — Progress Notes (Signed)
Subjective: Patient's mentation is improved but still confused.  Requested Xanax and his home pain medication can be restarted.    Objective: Vital signs in last 24 hours: Filed Vitals:   04/21/11 1300 04/21/11 2115 04/22/11 0433 04/22/11 1300  BP: 130/71 107/67 116/68 129/73  Pulse: 99 102 108 90  Temp: 98.4 F (36.9 C) 98.7 F (37.1 C) 98.2 F (36.8 C) 97.9 F (36.6 C)  TempSrc: Oral Oral Oral Oral  Resp: 22 20 20 20   Height:      Weight:   114.6 kg (252 lb 10.4 oz)   SpO2: 93% 93% 98% 95%   Weight change: 0.2 kg (7.1 oz)  Intake/Output Summary (Last 24 hours) at 04/22/11 1453 Last data filed at 04/22/11 1316  Gross per 24 hour  Intake   1120 ml  Output   2226 ml  Net  -1106 ml    Physical Exam: General: Awake, Oriented to self, location, month and year, No acute distress.  Not oriented to today's date. HEENT: EOMI, has scleral icterus. Neck: Supple CV: S1 and S2 Lungs: Clear to ascultation bilaterally Abdomen: Soft, Nontender, Nondistended, +bowel sounds. Ext: Good pulses. Trace edema. Skin: Multiple spider angiomas noted.  Lab Results:  [facility] 04/22/11 0530 04/21/11 1208  NA 127* 125*  K 4.7 5.1  CL 100 100  CO2 24 24  GLUCOSE 133* 210*  BUN 15 15  CREATININE 0.88 0.84  CALCIUM 8.1* 7.7*  MG -- --  PHOS -- --    Basename 04/20/11 0509  AST 131*  ALT 85*  ALKPHOS 125*  BILITOT 3.0*  PROT 7.0  ALBUMIN 2.0*   No results found for this basename: LIPASE:2,AMYLASE:2 in the last 72 hours  Basename 04/22/11 0530 04/21/11 0539  WBC 5.2 4.3  NEUTROABS -- --  HGB 10.6* 10.7*  HCT 30.5* 30.0*  MCV 101.7* 100.7*  PLT 51* 50*   No results found for this basename: CKTOTAL:3,CKMB:3,CKMBINDEX:3,TROPONINI:3 in the last 72 hours No components found with this basename: POCBNP:3 No results found for this basename: DDIMER:2 in the last 72 hours No results found for this basename: HGBA1C:2 in the last 72 hours No results found for this basename:  CHOL:2,HDL:2,LDLCALC:2,TRIG:2,CHOLHDL:2,LDLDIRECT:2 in the last 72 hours  Basename 04/20/11 0509  TSH 3.757  T4TOTAL --  T3FREE --  THYROIDAB --   No results found for this basename: VITAMINB12:2,FOLATE:2,FERRITIN:2,TIBC:2,IRON:2,RETICCTPCT:2 in the last 72 hours  Micro Results: Recent Results (from the past 240 hour(s))  URINE CULTURE     Status: Normal   Collection Time   04/18/11  1:07 AM      Component Value Range Status Comment   Specimen Description URINE, CATHETERIZED   Final    Special Requests NONE   Final    Culture  Setup Time [id number]   Final    Colony Count NO GROWTH   Final    Culture NO GROWTH   Final    Report Status 04/19/2011 FINAL   Final   MRSA PCR SCREENING     Status: Normal   Collection Time   04/19/11 10:16 AM      Component Value Range Status Comment   MRSA by PCR NEGATIVE  NEGATIVE  Final     Studies/Results: No results found.  Medications: I have reviewed the patient's current medications. Scheduled Meds:    . folic acid  1 mg Oral Daily  . furosemide  40 mg Oral Daily  . lactulose  30 g Oral QID  . levETIRAcetam  500 mg  Oral BID  . levothyroxine  75 mcg Oral Daily  . mulitivitamin with minerals  1 tablet Oral Daily  . pantoprazole  40 mg Oral Q1200  . rifaximin  550 mg Oral BID  . risperiDONE  0.25 mg Oral BID  . sodium chloride  3 mL Intravenous Q12H  . spironolactone  100 mg Oral Daily   Continuous Infusions:  PRN Meds:.sodium chloride, ALPRAZolam, diphenhydrAMINE, oxycodone, sodium chloride  Assessment/Plan: 1. Hepatic encephalopathy: Improving, not quite at baseline.  Continue lactulose.  Afebrile. Urinalysis was equivocal and there is no other evidence to suggest infection. Signs and symptoms most consistent with hepatic encephalopathy.  Resume patient's xanax and oxycodone, but at low dose. 2. Hyperammonemia: Resolved. 3. Abnormal urinalysis: Urinalysis suggestive but not definitive. Urine culture showed no growth and was  collected before antibiotic administration. Discontinued ciprofloxacin. 4. Acute renal failure: Resolved. 5. Hyponatremia: Stable without specific treatment. Fluid restriction. Chronic, most likely secondary to cirrhosis. May have played oral in altered mental status.  TSH normal at 3.757. Not on any obvious offending medications. 6. Cirrhosis with coagulopathy: Long-standing. Noted as early as May 2012.  From hepatitis C and history of alcohol abuse. 7. Chronic thrombocytopenia: Stable. No heparin products. 8. Hypothyroidism: Continue replacement therapy.  TSH normal. 9. Bipolar disorder/psychosis: Continue current therapy with risperidone. Does not appear to be definitively playing a role. 10. Morbid obesity: Diet and exercise with management as outpatient. 11. Disposition: Pending, patient not safe for discharged to independent living facility at this time. PT/OT evaluation pending to determine if patient needs rehabilitation prior to going to independent living facility.   LOS: 4 days  Tony Boyd A, MD 04/22/2011, 2:53 PM

## 2011-04-22 NOTE — Evaluation (Signed)
Physical Therapy Evaluation Patient Details Name: Tony Boyd MRN: [id number] DOB: 10/28/1955 Today's Date: 04/22/2011  Problem List:  Patient Active Problem List  Diagnoses  . Hepatic encephalopathy  . ETOH abuse  . Hepatitis C  . UTI (urinary tract infection)  . Coagulopathy  . Hyponatremia    Past Medical History:  Past Medical History  Diagnosis Date  . Cirrhosis of liver   . Hepatitis C   . ETOH abuse   . Hypothyroid   . Psychosis   . Bipolar disorder   . Seizures   . Arthritis   . Back pain   . Coagulopathy     Hx of  . Thrombocytopenia     Hx of  . Pancytopenia     Hx of  . H/O hypokalemia   . Hyponatremia     Hx of  . Korsakoff psychosis   . H/O abdominal abscess   . H/O esophageal varices   . Metabolic encephalopathy   . Hepatic encephalopathy   . Urinary urgency   . H/O renal failure   . H/O ascites    Past Surgical History:  Past Surgical History  Procedure Date  . Colostomy   . Cholecystectomy   . Small intestine surgery   . Arm surg     left arm  . US guided drain placement in ventral hernia abscess 07/21/2010  . Multiple picc line placements     PT Assessment/Plan/Recommendation PT Assessment Clinical Impression Statement: Patient admitted with hepatic encephalopathy and UTI presents with decreased activity tolerance, decreased independence with mobility and will benefit from skilled PT in the acute setting to maximize independence and safety to allow d/c home with HHPT. PT Recommendation/Assessment: Patient will need skilled PT in the acute care venue PT Problem List: Decreased activity tolerance;Decreased mobility;Decreased strength PT Therapy Diagnosis : Abnormality of gait;Generalized weakness PT Plan PT Frequency: Min 3X/week PT Treatment/Interventions: DME instruction;Gait training;Functional mobility training;Therapeutic activities;Therapeutic exercise;Patient/family education;Balance training PT Recommendation Recommendations  for Other Services: OT consult Follow Up Recommendations: Home health PT Equipment Recommended: None recommended by PT PT Goals  Acute Rehab PT Goals PT Goal Formulation: With patient Time For Goal Achievement: 7 days Pt will go Sit to Stand: with modified independence PT Goal: Sit to Stand - Progress: Goal set today Pt will go Stand to Sit: with modified independence Pt will Ambulate: >150 feet;with modified independence;with least restrictive assistive device PT Goal: Ambulate - Progress: Goal set today Pt will Perform Home Exercise Program: Independently PT Goal: Perform Home Exercise Program - Progress: Goal set today  PT Evaluation Precautions/Restrictions    Prior Functioning  Home Living Lives With: Alone Receives Help From: Family Type of Home: Independent living facility Home Layout: One level Home Access: Level entry;Elevator Home Adaptive Equipment: Walker - four wheeled;Straight cane Prior Function Level of Independence: Independent with basic ADLs;Independent with transfers;Requires assistive device for independence;Independent with gait;Needs assistance with homemaking Driving: No (daughter assists with transportation) Comments: has housekeeper every 2 weeks Cognition Cognition Arousal/Alertness: Awake/alert Overall Cognitive Status: Appears within functional limits for tasks assessed Orientation Level: Oriented X4 Sensation/Coordination Sensation Light Touch: Appears Intact Extremity Assessment RLE Assessment RLE Assessment: Within Functional Limits LLE Assessment LLE Assessment: Within Functional Limits Mobility (including Balance) Bed Mobility Bed Mobility: Yes Sit to Supine: 5: Supervision Sit to Supine - Details (indicate cue type and reason): cues for positioning Transfers Transfers: Yes Sit to Stand: 5: Supervision;With upper extremity assist;With armrests;From chair/3-in-1 Sit to Stand Details (indicate cue type and reason): for  safety Stand to  Sit: 5: Supervision;With upper extremity assist;To bed Stand to Sit Details: cues for positioning near [facility] Ambulation/Gait Ambulation/Gait: Yes Ambulation/Gait Assistance: 5: Supervision Ambulation/Gait Assistance Details (indicate cue type and reason): for safety; c/o SOB after ambulation, SpO2 96% Ambulation Distance (Feet): 150 Feet Assistive device: Rolling walker Gait Pattern: Decreased stride length    Exercise    End of Session PT - End of Session Equipment Utilized During Treatment: Gait belt Activity Tolerance: Patient tolerated treatment well Patient left: in bed;with call bell in reach General Behavior During Session: [facility] for tasks performed Cognition: [facility] for tasks performed  [facility] 04/22/2011, 4:52 PM

## 2011-04-23 LAB — CBC
HCT: 29.6 % — ABNORMAL LOW (ref 39.0–52.0)
Hemoglobin: 10.3 g/dL — ABNORMAL LOW (ref 13.0–17.0)
RBC: 2.92 MIL/uL — ABNORMAL LOW (ref 4.22–5.81)
RDW: 15.3 % (ref 11.5–15.5)
WBC: 4.5 10*3/uL (ref 4.0–10.5)

## 2011-04-23 LAB — BASIC METABOLIC PANEL
BUN: 12 mg/dL (ref 6–23)
Chloride: 98 mEq/L (ref 96–112)
GFR calc Af Amer: >90 mL/min (ref >90)
Potassium: 4.5 mEq/L (ref 3.5–5.1)

## 2011-04-23 MED ORDER — OXYCODONE HCL 5 MG PO TABS: 2.5000 mg | ORAL_TABLET | Freq: Four times a day (QID) | ORAL | Status: AC | PRN

## 2011-04-23 MED ORDER — ALPRAZOLAM 0.25 MG PO TABS: 0.2500 mg | ORAL_TABLET | Freq: Three times a day (TID) | ORAL | Status: DC | PRN

## 2011-04-23 NOTE — Discharge Summary (Signed)
Discharge Summary  Tony Boyd MR#: [id number]  DOB:1955/04/05  Date of Admission: 04/18/2011 Date of Discharge: 04/23/2011  Patient's PCP: REED,TIFFANY L., DO, DO  Attending Physician:Randle Shatzer A  Consults: None  Discharge Diagnoses: Principal Problem:  *Hepatic encephalopathy Active Problems:  ETOH abuse  Hepatitis C  UTI (urinary tract infection)  Coagulopathy  Hyponatremia  morbid obesity  Brief Admitting History and Physical 56 year old Caucasian male with history of liver cirrhosis, prior alcohol abuse, hepatitis C, hypothyroidism, history of seizure, who was brought into the emergency department on 04/23/2011 with increased confusion.  Discharge Medications Medication List  As of 04/23/2011 11:06 AM   STOP taking these medications         oxycodone 5 MG capsule         TAKE these medications         ALPRAZolam 0.25 MG tablet   Commonly known as: XANAX   Take 1 tablet (0.25 mg total) by mouth 3 (three) times daily as needed for anxiety.      folic acid 1 MG tablet   Commonly known as: FOLVITE   Take 1 mg by mouth daily.      furosemide 40 MG tablet   Commonly known as: LASIX   Take 40 mg by mouth daily.      LACTULOSE PO   Take 50 mLs by mouth daily. Take 5 times daily      levETIRAcetam 500 MG tablet   Commonly known as: KEPPRA   Take 500 mg by mouth 2 (two) times daily.      levothyroxine 75 MCG tablet   Commonly known as: SYNTHROID, LEVOTHROID   Take 75 mcg by mouth daily.      multivitamin tablet   Take 1 tablet by mouth daily.      omeprazole 20 MG capsule   Commonly known as: PRILOSEC   Take 20 mg by mouth daily.      oxyCODONE 5 MG immediate release tablet   Commonly known as: Oxy IR/ROXICODONE   Take 0.5 tablets (2.5 mg total) by mouth every 6 (six) hours as needed for pain.      rifaximin 550 MG Tabs   Commonly known as: XIFAXAN   Take 550 mg by mouth daily.      risperiDONE 0.25 MG tablet   Commonly known as: RISPERDAL   Take 0.25 mg by mouth 2 (two) times daily.      spironolactone 100 MG tablet   Commonly known as: ALDACTONE   Take 100 mg by mouth daily.            Hospital Course: 1. Hepatic encephalopathy: Improved during the course of hospital stay.  Suspect hepatic encephalopathy likely due to noncompliance with patient not taking lactulose for one day prior to admission.  Patient was started on lactulose and was titrated up as tolerated.  Patient to continue lactulose 5 times a day as already dosed at home.  Initially on admission patient had elevated ammonia of 84 which improved to 36 during the course of hospital stay.  Afebrile. Urinalysis was equivocal and there is no other evidence to suggest infection.  Initially patient's Xanax and oxycodone were held as patient's mentation improved on February 24 of 2013, Xanax and oxycodone were restarted an overdose which will be continued at discharge.   2. Hyperammonemia: Resolved. 3. Abnormal urinalysis: Urinalysis suggestive but not definitive. Urine culture showed no growth and was collected before antibiotic administration.  Was initially on ciprofloxacin which was discontinued. 4. Acute renal  failure: Resolved. 5. Hyponatremia: Stable without specific treatment. Fluid restriction, which will be continued at discharge. Chronic, most likely secondary to cirrhosis. May have played in altered mental status. TSH normal at 3.757. Not on any obvious offending medications. 6. Cirrhosis with coagulopathy: Long-standing. Noted as early as May 2012. From hepatitis C and history of alcohol abuse. 7. Chronic thrombocytopenia: Stable. No heparin products. 8. Hypothyroidism: Continue replacement therapy. TSH normal. 9. Bipolar disorder/psychosis: Continue current therapy with risperidone. Does not appear to be definitively playing a role. 10. Morbid obesity: Diet and exercise with management as outpatient. 11. Disposition: Discharge patient to independent living with  HHPT. OT recommended no followup, but given patient's history of hepatic encephalopathy will arrange for [facility] for safety, and if no needs then can signoff.  Will arrange for home health RN at discharge.  Day of Discharge BP 115/66  Pulse 92  Temp(Src) 97.3 F (36.3 C) (Axillary)  Resp 20  Ht 5\' 7"  (1.702 m)  Wt 113.4 kg (250 lb)  BMI 39.16 kg/m2  SpO2 95%  Results for orders placed during the hospital encounter of 04/18/11 (from the past 48 hour(s))  BASIC METABOLIC PANEL     Status: Abnormal   Collection Time   04/21/11 12:08 PM      Component Value Range Comment   Sodium 125 (*) 135 - 145 (mEq/L)    Potassium 5.1  3.5 - 5.1 (mEq/L) NO VISIBLE HEMOLYSIS   Chloride 100  96 - 112 (mEq/L)    CO2 24  19 - 32 (mEq/L)    Glucose, Bld 210 (*) 70 - 99 (mg/dL)    BUN 15  6 - 23 (mg/dL)    Creatinine, Ser 1.61  0.50 - 1.35 (mg/dL)    Calcium 7.7 (*) 8.4 - 10.5 (mg/dL)    GFR calc non Af Amer >90  >90 (mL/min)    GFR calc Af Amer >90  >90 (mL/min)   CBC     Status: Abnormal   Collection Time   04/22/11  5:30 AM      Component Value Range Comment   WBC 5.2  4.0 - 10.5 (K/uL)    RBC 3.00 (*) 4.22 - 5.81 (MIL/uL)    Hemoglobin 10.6 (*) 13.0 - 17.0 (g/dL)    HCT 09.6 (*) 04.5 - 52.0 (%)    MCV 101.7 (*) 78.0 - 100.0 (fL)    MCH 35.3 (*) 26.0 - 34.0 (pg)    MCHC 34.8  30.0 - 36.0 (g/dL)    RDW 40.9  81.1 - 91.4 (%)    Platelets 51 (*) 150 - 400 (K/uL) CONSISTENT WITH PREVIOUS RESULT  BASIC METABOLIC PANEL     Status: Abnormal   Collection Time   04/22/11  5:30 AM      Component Value Range Comment   Sodium 127 (*) 135 - 145 (mEq/L)    Potassium 4.7  3.5 - 5.1 (mEq/L)    Chloride 100  96 - 112 (mEq/L)    CO2 24  19 - 32 (mEq/L)    Glucose, Bld 133 (*) 70 - 99 (mg/dL)    BUN 15  6 - 23 (mg/dL)    Creatinine, Ser 7.82  0.50 - 1.35 (mg/dL)    Calcium 8.1 (*) 8.4 - 10.5 (mg/dL)    GFR calc non Af Amer >90  >90 (mL/min)    GFR calc Af Amer >90  >90 (mL/min)   GLUCOSE, CAPILLARY      Status: Abnormal   Collection Time  04/22/11  9:36 PM      Component Value Range Comment   Glucose-Capillary 142 (*) 70 - 99 (mg/dL)   CBC     Status: Abnormal   Collection Time   04/23/11  4:20 AM      Component Value Range Comment   WBC 4.5  4.0 - 10.5 (K/uL)    RBC 2.92 (*) 4.22 - 5.81 (MIL/uL)    Hemoglobin 10.3 (*) 13.0 - 17.0 (g/dL)    HCT 04.5 (*) 40.9 - 52.0 (%)    MCV 101.4 (*) 78.0 - 100.0 (fL)    MCH 35.3 (*) 26.0 - 34.0 (pg)    MCHC 34.8  30.0 - 36.0 (g/dL)    RDW 81.1  91.4 - 78.2 (%)    Platelets 49 (*) 150 - 400 (K/uL) CONSISTENT WITH PREVIOUS RESULT  BASIC METABOLIC PANEL     Status: Abnormal   Collection Time   04/23/11  4:20 AM      Component Value Range Comment   Sodium 125 (*) 135 - 145 (mEq/L)    Potassium 4.5  3.5 - 5.1 (mEq/L)    Chloride 98  96 - 112 (mEq/L)    CO2 23  19 - 32 (mEq/L)    Glucose, Bld 138 (*) 70 - 99 (mg/dL)    BUN 12  6 - 23 (mg/dL)    Creatinine, Ser 9.56  0.50 - 1.35 (mg/dL)    Calcium 7.9 (*) 8.4 - 10.5 (mg/dL)    GFR calc non Af Amer >90  >90 (mL/min)    GFR calc Af Amer >90  >90 (mL/min)   AMMONIA     Status: Abnormal   Collection Time   04/23/11  4:20 AM      Component Value Range Comment   Ammonia 64 (*) 11 - 60 (umol/L)     Dg Chest Port 1 View  04/18/2011  *RADIOLOGY REPORT*  Clinical Data: Altered mental status.  PORTABLE CHEST - 1 VIEW  Comparison: Chest radiograph performed 10/09/2010  Findings: The lungs are mildly hypoexpanded.  Vascular crowding and vascular congestion are seen, with mildly increased interstitial markings, raising question for minimal interstitial edema.  There is no evidence of pleural effusion or pneumothorax.  The heart is mildly enlarged.  No acute osseous abnormalities are seen.  IMPRESSION: Lungs mildly hypoexpanded; vascular congestion and mild cardiomegaly, with mildly increased interstitial markings, raising question for minimal interstitial edema.  Original Report Authenticated By: Tonia Ghent,  M.D.   Disposition: Independent living with home health RN, PT, OT.  Diet: Low-sodium diet with 1.5 L of fluid restriction.  Activity: Resume as tolerated per physical therapy.   Follow-up Appts: Discharge Orders    Future Orders Please Complete By Expires   Diet - low sodium heart healthy      Comments:   1.5 L of fluid restriction.   Increase activity slowly      Discharge instructions      Comments:   Followup with REED,TIFFANY L., DO (PCP) in 1 week.     Discussed with the patient's daughter prior to discharge.  TESTS THAT NEED FOLLOW-UP None  Time spent on discharge, talking to the patient, and coordinating care: 35 mins.  Signed: Cristal Ford, MD 04/23/2011, 11:06 AM

## 2011-04-23 NOTE — Progress Notes (Signed)
Subjective: Patient's mentation is resolved and at baseline.  Patient knew what brought him to the hospital.  Objective: Vital signs in last 24 hours: Filed Vitals:   04/22/11 1555 04/22/11 2136 04/23/11 0401 04/23/11 0917  BP:  126/70 109/63 115/66  Pulse:  93 92   Temp:  98.5 F (36.9 C) 97.3 F (36.3 C)   TempSrc:  Oral Axillary   Resp:  22 20   Height:      Weight:   113.4 kg (250 lb)   SpO2: 96% 95% 95%    Weight change: -1.2 kg (-2 lb 10.3 oz)  Intake/Output Summary (Last 24 hours) at 04/23/11 1051 Last data filed at 04/23/11 0824  Gross per 24 hour  Intake    480 ml  Output   1377 ml  Net   -897 ml    Physical Exam: General: Awake, Oriented X3, No acute distress.   HEENT: EOMI, has scleral icterus. Neck: Supple CV: S1 and S2 Lungs: Clear to ascultation bilaterally Abdomen: Soft, Nontender, Nondistended, +bowel sounds. Ext: Good pulses. Trace edema. Skin: Multiple spider angiomas noted.  Lab Results:  Basename 04/23/11 0420 04/22/11 0530  NA 125* 127*  K 4.5 4.7  CL 98 100  CO2 23 24  GLUCOSE 138* 133*  BUN 12 15  CREATININE 0.78 0.88  CALCIUM 7.9* 8.1*  MG -- --  PHOS -- --   No results found for this basename: AST:2,ALT:2,ALKPHOS:2,BILITOT:2,PROT:2,ALBUMIN:2 in the last 72 hours No results found for this basename: LIPASE:2,AMYLASE:2 in the last 72 hours  Basename 04/23/11 0420 04/22/11 0530  WBC 4.5 5.2  NEUTROABS -- --  HGB 10.3* 10.6*  HCT 29.6* 30.5*  MCV 101.4* 101.7*  PLT 49* 51*   No results found for this basename: CKTOTAL:3,CKMB:3,CKMBINDEX:3,TROPONINI:3 in the last 72 hours No components found with this basename: POCBNP:3 No results found for this basename: DDIMER:2 in the last 72 hours No results found for this basename: HGBA1C:2 in the last 72 hours No results found for this basename: CHOL:2,HDL:2,LDLCALC:2,TRIG:2,CHOLHDL:2,LDLDIRECT:2 in the last 72 hours No results found for this basename: TSH,T4TOTAL,FREET3,T3FREE,THYROIDAB in  the last 72 hours No results found for this basename: VITAMINB12:2,FOLATE:2,FERRITIN:2,TIBC:2,IRON:2,RETICCTPCT:2 in the last 72 hours  Micro Results: Recent Results (from the past 240 hour(s))  URINE CULTURE     Status: Normal   Collection Time   04/18/11  1:07 AM      Component Value Range Status Comment   Specimen Description URINE, CATHETERIZED   Final    Special Requests NONE   Final    Culture  Setup Time [id number]   Final    Colony Count NO GROWTH   Final    Culture NO GROWTH   Final    Report Status 04/19/2011 FINAL   Final   MRSA PCR SCREENING     Status: Normal   Collection Time   04/19/11 10:16 AM      Component Value Range Status Comment   MRSA by PCR NEGATIVE  NEGATIVE  Final     Studies/Results: No results found.  Medications: I have reviewed the patient's current medications. Scheduled Meds:    . folic acid  1 mg Oral Daily  . furosemide  40 mg Oral Daily  . lactulose  30 g Oral QID  . levETIRAcetam  500 mg Oral BID  . levothyroxine  75 mcg Oral Daily  . mulitivitamin with minerals  1 tablet Oral Daily  . pantoprazole  40 mg Oral Q1200  . rifaximin  550 mg Oral  BID  . risperiDONE  0.25 mg Oral BID  . sodium chloride  3 mL Intravenous Q12H  . spironolactone  100 mg Oral Daily   Continuous Infusions:  PRN Meds:.sodium chloride, ALPRAZolam, diphenhydrAMINE, oxyCODONE, sodium chloride, DISCONTD: oxycodone  Assessment/Plan: 1. Hepatic encephalopathy: Improving, at baseline.  Continue lactulose.  Afebrile. Urinalysis was equivocal and there is no other evidence to suggest infection. Signs and symptoms most consistent with hepatic encephalopathy.  Continue patient's xanax and oxycodone at low dose. 2. Hyperammonemia: Resolved. 3. Abnormal urinalysis: Urinalysis suggestive but not definitive. Urine culture showed no growth and was collected before antibiotic administration. Discontinued ciprofloxacin. 4. Acute renal failure: Resolved. 5. Hyponatremia: Stable  without specific treatment. Fluid restriction. Chronic, most likely secondary to cirrhosis. May have played in altered mental status.  TSH normal at 3.757. Not on any obvious offending medications. 6. Cirrhosis with coagulopathy: Long-standing. Noted as early as May 2012.  From hepatitis C and history of alcohol abuse. 7. Chronic thrombocytopenia: Stable. No heparin products. 8. Hypothyroidism: Continue replacement therapy.  TSH normal. 9. Bipolar disorder/psychosis: Continue current therapy with risperidone. Does not appear to be definitively playing a role. 10. Morbid obesity: Diet and exercise with management as outpatient. 11. Disposition: Discharge patient to independent living with HHPT. OT recommended no followup, but given patient's history of hepatic encephalopathy will arrange for [facility] for safety, and if no needs then can signoff.   LOS: 5 days  Tikita Mabee A, MD 04/23/2011, 10:51 AM

## 2011-04-23 NOTE — Progress Notes (Signed)
   CARE MANAGEMENT NOTE 04/23/2011  Patient:  Tony Boyd, Tony Boyd   Account Number:  [id number]  Date Initiated:  04/19/2011  Documentation initiated by:  DAVIS,RHONDA  Subjective/Objective Assessment:   pt admitted due to confusion and hx of etoh abuse and endocrine problems     Action/Plan:   lives at home   Anticipated DC Date:  04/23/2011   Anticipated DC Plan:  HOME W HOME HEALTH SERVICES         [facility] Choice  Resumption Of Svcs/PTA Provider   Choice offered to / List presented to:          [facility] arranged  HH-2 PT  HH-3 OT  HH-1 RN      Status of service:  Completed, signed off Medicare Important Message given?   (If response is "NO", the following Medicare IM given date fields will be blank) Date Medicare IM given:   Date Additional Medicare IM given:    Discharge Disposition:  HOME W HOME HEALTH SERVICES  Per UR Regulation:  Reviewed for med. necessity/level of care/duration of stay  Comments:  04/23/11 Taeden Geller RN,BSN NCM [phone] SPOKE TO MARY([facility])ABOUT D/C TODAY HHRN/PT/OT.FROM [facility]. 04/20/11 Edin Kon RN,BSN NCM [phone] SPOE TO DTR ABOUT D/C PLANS [name]-PATIENT IS AT [facility]-HAS CANE, RW.ACTIVE W/[facility]-HHRN 1XWK.SPOKE TO MARY([facility]) INFORMED OF ADMISSION & FOLLOWING FOR HHC NEEDS.NOTED PT/OT EVAL. MAY NEED TRANSP-CSW NOTIFIED. [id number] DAvis,RN,BSN,CCM

## 2011-04-23 NOTE — Evaluation (Signed)
Occupational Therapy Evaluation Patient Details Name: Tony Boyd MRN: [id number] DOB: 1955-12-30 Today's Date: 04/23/2011  Problem List:  Patient Active Problem List  Diagnoses  . Hepatic encephalopathy  . ETOH abuse  . Hepatitis C  . UTI (urinary tract infection)  . Coagulopathy  . Hyponatremia    Past Medical History:  Past Medical History  Diagnosis Date  . Cirrhosis of liver   . Hepatitis C   . ETOH abuse   . Hypothyroid   . Psychosis   . Bipolar disorder   . Seizures   . Arthritis   . Back pain   . Coagulopathy     Hx of  . Thrombocytopenia     Hx of  . Pancytopenia     Hx of  . H/O hypokalemia   . Hyponatremia     Hx of  . Korsakoff psychosis   . H/O abdominal abscess   . H/O esophageal varices   . Metabolic encephalopathy   . Hepatic encephalopathy   . Urinary urgency   . H/O renal failure   . H/O ascites    Past Surgical History:  Past Surgical History  Procedure Date  . Colostomy   . Cholecystectomy   . Small intestine surgery   . Arm surg     left arm  . US guided drain placement in ventral hernia abscess 07/21/2010  . Multiple picc line placements     OT Assessment/Plan/Recommendation OT Assessment Clinical Impression Statement: Pt is a 56 yo male who presents w/limited overall activity tolerance, feel pt is close to baseline with regards to ADLs. Will follow acutely to maximize I to mod I level in prep for safe d/c home. OT Recommendation/Assessment: Patient will need skilled OT in the acute care venue OT Problem List: Decreased activity tolerance;Decreased safety awareness OT Therapy Diagnosis : Generalized weakness OT Plan OT Frequency: Min 2X/week OT Treatment/Interventions: Self-care/ADL training;Therapeutic activities;Patient/family education;DME and/or AE instruction OT Recommendation Follow Up Recommendations: No OT follow up Equipment Recommended: None recommended by OT Individuals Consulted Consulted and Agree with Results  and Recommendations: Patient OT Goals Acute Rehab OT Goals OT Goal Formulation: With patient Time For Goal Achievement: 2 weeks ADL Goals Pt Will Perform Grooming: with modified independence;Standing at sink (X 3 tasks to improve standing activity tolerance.) ADL Goal: Grooming - Progress: Goal set today Pt Will Perform Lower Body Bathing: with modified independence;Sit to stand from chair;Sit to stand from bed ADL Goal: Lower Body Bathing - Progress: Goal set today Pt Will Perform Lower Body Dressing: with modified independence;Sit to stand from bed;Sit to stand from chair;with adaptive equipment ADL Goal: Lower Body Dressing - Progress: Goal set today Pt Will Transfer to Toilet: with modified independence;Regular height toilet;Grab bars ADL Goal: Toilet Transfer - Progress: Goal set today Pt Will Perform Toileting - Clothing Manipulation: with modified independence;Standing ADL Goal: Toileting - Clothing Manipulation - Progress: Goal set today Pt Will Perform Toileting - Hygiene: with modified independence;Sit to stand from 3-in-1/toilet ADL Goal: Toileting - Hygiene - Progress: Goal set today  OT Evaluation Precautions/Restrictions  Restrictions Weight Bearing Restrictions: No Prior Functioning Home Living Lives With: Alone Type of Home: Independent living facility Home Layout: One level Home Access: Level entry;Elevator Bathroom Shower/Tub: Health visitor: Standard Home Adaptive Equipment: Tub transfer bench;Grab bars in shower;Grab bars around toilet;Walker - four wheeled;Straight cane Prior Function Level of Independence: Independent with basic ADLs;Requires assistive device for independence;Independent with gait;Independent with transfers;Needs assistance with homemaking Meal Prep: Total Light Housekeeping:  Total Driving: No Vocation: On disability Comments: has housekeeper every 2 weeks. ADL ADL Grooming: Performed;Wash/dry hands;Wash/dry face;Teeth  care;Supervision/safety Where Assessed - Grooming: Standing at sink Upper Body Bathing: Simulated;Supervision/safety Where Assessed - Upper Body Bathing: Standing at sink Lower Body Bathing: Simulated;Minimal assistance Where Assessed - Lower Body Bathing: Sit to stand from bed Upper Body Dressing: Simulated;Supervision/safety Where Assessed - Upper Body Dressing: Standing Lower Body Dressing: Simulated;Minimal assistance Where Assessed - Lower Body Dressing: Sit to stand from bed Toilet Transfer: Performed;Supervision/safety Toilet Transfer Method: Proofreader: Regular height toilet;Grab bars Toileting - Clothing Manipulation: Performed;Supervision/safety Where Assessed - Toileting Clothing Manipulation: Sit to stand from 3-in-1 or toilet Toileting - Hygiene: Simulated;Supervision/safety Where Assessed - Toileting Hygiene: Sit to stand from 3-in-1 or toilet Tub/Shower Transfer: Not assessed Tub/Shower Transfer Method: Not assessed Equipment Used: Rolling walker Ambulation Related to ADLs: Pt fatigued quickly when ambulating to bathroom and after grooming at the sink. Vision/Perception  Vision - History Baseline Vision: Wears glasses only for reading Patient Visual Report: No change from baseline Cognition Cognition Arousal/Alertness: Awake/alert Overall Cognitive Status: Appears within functional limits for tasks assessed Orientation Level: Oriented X4 Sensation/Coordination   Extremity Assessment RUE Assessment RUE Assessment: Within Functional Limits LUE Assessment LUE Assessment: Within Functional Limits Mobility  Bed Mobility Bed Mobility: Yes Supine to Sit: 5: Supervision;HOB elevated (Comment degrees);With rails Sit to Supine: 5: Supervision;HOB elevated (comment degrees);With rail Transfers Sit to Stand: 5: Supervision;With upper extremity assist;From toilet;From bed Sit to Stand Details (indicate cue type and reason): cues for hand  placement Stand to Sit: 5: Supervision;With upper extremity assist;To chair/3-in-1;To bed Stand to Sit Details: cues for hand placement Exercises   End of Session OT - End of Session Activity Tolerance: Patient tolerated treatment well Patient left: in bed;with call bell in reach General Behavior During Session: [facility] for tasks performed Cognition: [facility] for tasks performed   Aulton Routt A, OTR/L [phone] 04/23/2011, 10:49 AM

## 2011-05-02 ENCOUNTER — Other Ambulatory Visit: Payer: Medicare Other | Admitting: Gastroenterology

## 2011-05-08 ENCOUNTER — Encounter (HOSPITAL_COMMUNITY): Payer: Self-pay | Admitting: Emergency Medicine

## 2011-05-08 ENCOUNTER — Emergency Department (HOSPITAL_COMMUNITY)
Admission: EM | Admit: 2011-05-08 | Discharge: 2011-05-08 | Disposition: A | Discharge status: Home / Self Care | Payer: Medicare Other | Attending: Emergency Medicine | Admitting: Emergency Medicine

## 2011-05-08 ENCOUNTER — Emergency Department (HOSPITAL_COMMUNITY): Payer: Medicare Other

## 2011-05-08 DIAGNOSIS — F29 Unspecified psychosis not due to a substance or known physiological condition: Secondary | ICD-10-CM | POA: Insufficient documentation

## 2011-05-08 DIAGNOSIS — F319 Bipolar disorder, unspecified: Secondary | ICD-10-CM | POA: Insufficient documentation

## 2011-05-08 DIAGNOSIS — E039 Hypothyroidism, unspecified: Secondary | ICD-10-CM | POA: Insufficient documentation

## 2011-05-08 DIAGNOSIS — R4182 Altered mental status, unspecified: Secondary | ICD-10-CM | POA: Insufficient documentation

## 2011-05-08 DIAGNOSIS — K7460 Unspecified cirrhosis of liver: Secondary | ICD-10-CM

## 2011-05-08 LAB — BASIC METABOLIC PANEL
CO2: 21 mEq/L (ref 19–32)
Calcium: 8.6 mg/dL (ref 8.4–10.5)
GFR calc non Af Amer: >90 mL/min (ref >90)
Glucose, Bld: 130 mg/dL — ABNORMAL HIGH (ref 70–99)
Potassium: 4.1 mEq/L (ref 3.5–5.1)
Sodium: 123 mEq/L — ABNORMAL LOW (ref 135–145)

## 2011-05-08 LAB — URINALYSIS, ROUTINE W REFLEX MICROSCOPIC
Glucose, UA: NEGATIVE mg/dL
Hgb urine dipstick: NEGATIVE
Leukocytes, UA: NEGATIVE
Protein, ur: NEGATIVE mg/dL
Specific Gravity, Urine: 1.015 (ref 1.005–1.030)
Urobilinogen, UA: 0.2 mg/dL (ref 0.0–1.0)

## 2011-05-08 LAB — HEPATIC FUNCTION PANEL
AST: 74 U/L — ABNORMAL HIGH (ref 0–37)
Albumin: 2.3 g/dL — ABNORMAL LOW (ref 3.5–5.2)
Total Bilirubin: 4.2 mg/dL — ABNORMAL HIGH (ref 0.3–1.2)

## 2011-05-08 MED ORDER — OXYCODONE HCL 5 MG PO TABS
5.0000 mg | ORAL_TABLET | Freq: Once | ORAL | Status: AC
  Administered 2011-05-08: 5 mg via ORAL
  Filled 2011-05-08: qty 1

## 2011-05-08 NOTE — ED Notes (Signed)
[facility] unable to get labs.  [facility] to call phlebotomy

## 2011-05-08 NOTE — ED Notes (Signed)
Pt given phone to call daughter for ride home.

## 2011-05-08 NOTE — ED Notes (Signed)
PT lives at the [facility].  Staff believes his ammonia levels are elevated as he had a blank stare on his face this am.  Hx cirrhosis.  Pt only c/o chronic back pains.

## 2011-05-08 NOTE — ED Notes (Signed)
Phlebotomy notified of need for lab draw. 

## 2011-05-08 NOTE — Discharge Instructions (Signed)
You can be discharged home to follow up with your doctor for recheck as needed.

## 2011-05-08 NOTE — ED Provider Notes (Signed)
Pt is alert and oriented at this time.  Ammonia level was ordered however it was placed in the wrong colored tube.  Pt required an arterial stick.  At this time he has been up ambulating around the ED and is not altered in any way.  I do not feel that we need to re draw at this time.  Will ask him to closely follow up with his PCP  Medical screening examination/treatment/procedure(s) were conducted as a shared visit with non-physician practitioner(s) and myself.  I personally evaluated the patient during the encounter   Celene Kras, MD 05/08/11 2008

## 2011-05-08 NOTE — ED Notes (Signed)
Daughter called to pick pt up. Pt to wait in room until she arrives.

## 2011-05-08 NOTE — ED Notes (Signed)
Patient sent from non-skilled independent living for evaluation of ammonia level. Patient reports that he has cirrhosis and ammonia level was high 2 weeks ago. Reports that his friends report that he is becoming more confused and he can tell when level up. reports abdominal and back pain. Skin with yellowing and slight jaundice appearance

## 2011-05-08 NOTE — ED Notes (Signed)
NURSE IS AWARE OF NEEDING BLOOD DRAW FOR PT.  MAIN LAB WAS CALLED TO DRAW BLOOD. SPOKE TO MARY IN MAIN LAB. MARY STATED "I'LL BE THERE SHORTLY".

## 2011-05-08 NOTE — ED Provider Notes (Signed)
History     CSN: [id number]  Arrival date & time 05/08/11  1240   First MD Initiated Contact with Patient 05/08/11 1351      Chief Complaint  Patient presents with  . Altered Mental Status    at times.  pt lives at [facility].  Staff believes his ammonia levels are elevated d/t hx cirrhosis.    (Consider location/radiation/quality/duration/timing/severity/associated sxs/prior treatment) Patient is a 56 y.o. male presenting with altered mental status. The history is provided by the patient.  Altered Mental Status This is a new problem. The current episode started yesterday. The problem has been resolved. Pertinent negatives include no abdominal pain, chills, coughing, fever or rash. Associated symptoms comments: He reports that he became confused last night as he has in the past when his ammonia elevates. This morning he realized his condition and presents for evaluation. No fever, vomiting. He has chronic back pain but no other complaint of discomfort. His last paracentesis was about one month ago and he reports he does not have concerning abdominal swelling today.. The symptoms are aggravated by nothing.    Past Medical History  Diagnosis Date  . Cirrhosis of liver   . Hepatitis C   . ETOH abuse   . Hypothyroid   . Psychosis   . Bipolar disorder   . Seizures   . Arthritis   . Back pain   . Coagulopathy     Hx of  . Thrombocytopenia     Hx of  . Pancytopenia     Hx of  . H/O hypokalemia   . Hyponatremia     Hx of  . Korsakoff psychosis   . H/O abdominal abscess   . H/O esophageal varices   . Metabolic encephalopathy   . Hepatic encephalopathy   . Urinary urgency   . H/O renal failure   . H/O ascites     Past Surgical History  Procedure Date  . Colostomy   . Cholecystectomy   . Small intestine surgery   . Arm surg     left arm  . US guided drain placement in ventral hernia abscess 07/21/2010  . Multiple picc line placements     Family History  Problem  Relation Age of Onset  . Heart disease Mother     History  Substance Use Topics  . Smoking status: Former Smoker    Types: Cigarettes    Quit date: 02/15/2011  . Smokeless tobacco: Never Used  . Alcohol Use: No      Review of Systems  Constitutional: Negative for fever and chills.  HENT: Negative.   Respiratory: Negative.  Negative for cough.   Cardiovascular: Negative.   Gastrointestinal: Negative.  Negative for abdominal pain.  Musculoskeletal: Negative.   Skin: Negative.  Negative for rash.  Neurological: Negative.        Altered mental status last night.  Psychiatric/Behavioral: Positive for confusion and altered mental status.    Allergies  Droperidol; Ketorolac; and Penicillins  Home Medications   Current Outpatient Rx  Name Route Sig Dispense Refill  . ALPRAZOLAM 0.25 MG PO TABS Oral Take 1 tablet (0.25 mg total) by mouth 3 (three) times daily as needed for anxiety. 30 tablet 0  . FOLIC ACID 1 MG PO TABS Oral Take 1 mg by mouth daily.    . FUROSEMIDE 40 MG PO TABS Oral Take 40 mg by mouth daily.    [name] LACTULOSE PO Oral Take 50 mLs by mouth daily. Take 5 times daily    .  LEVETIRACETAM 500 MG PO TABS Oral Take 500 mg by mouth 2 (two) times daily.    [name] LEVOTHYROXINE SODIUM 75 MCG PO TABS Oral Take 75 mcg by mouth daily.    [name] ONE-DAILY MULTI VITAMINS PO TABS Oral Take 1 tablet by mouth daily.    [name] OMEPRAZOLE 20 MG PO CPDR Oral Take 20 mg by mouth daily.    [name] RIFAXIMIN 550 MG PO TABS Oral Take 550 mg by mouth daily.    [name] RISPERIDONE 0.25 MG PO TABS Oral Take 0.25 mg by mouth 2 (two) times daily.    [name] SPIRONOLACTONE 100 MG PO TABS Oral Take 100 mg by mouth daily.      BP 112/63  Pulse 95  Temp(Src) 98.2 F (36.8 C) (Oral)  Resp 20  SpO2 99%  Physical Exam  Constitutional: He is oriented to person, place, and time. He appears well-developed and well-nourished.  HENT:  Head: Normocephalic.  Eyes: Scleral icterus is present.  Neck: Normal range of motion. Neck  supple.  Cardiovascular: Normal rate and regular rhythm.   No murmur heard. Pulmonary/Chest: Effort normal and breath sounds normal. He has no wheezes. He has no rales.  Abdominal: Soft. There is no tenderness. There is no rebound and no guarding.       Abdomen is obese, soft without tenderness.   Musculoskeletal: Normal range of motion. He exhibits edema.  Neurological: He is alert and oriented to person, place, and time. No cranial nerve deficit. Coordination normal.       No asterixis.  Skin: Skin is warm and dry. No rash noted.       Yellowish in appearance c/w jaundice.  Psychiatric: He has a normal mood and affect.    ED Course  Procedures (including critical care time)   Labs Reviewed  AMMONIA  CBC  DIFFERENTIAL  HEPATIC FUNCTION PANEL  URINALYSIS, ROUTINE W REFLEX MICROSCOPIC   No results found.   No diagnosis found.    MDM  Multiple attempts to obtain blood for testing failed. Arterial draw done by me with limited amount, used to check hepatic function and chemistries. The patient has remained alert, oriented, ambulatory, cooperative, and has eaten a normal diet. No evidence of altered mental status or confusion during his prolonged ED visit.         Rodena Medin, PA-C 05/08/11 2043

## 2011-05-08 NOTE — ED Notes (Signed)
PA completed arterial stick for labs

## 2011-05-08 NOTE — ED Notes (Signed)
Attempt x2 by this RN to start IV and obtain labs. Unsuccessful. Will notify phlebotomy.

## 2011-05-11 ENCOUNTER — Encounter: Payer: Self-pay | Admitting: Gastroenterology

## 2011-05-22 NOTE — Progress Notes (Signed)
Blood was collected but put in wrong tube. Patient remained alert without altered mental status and was discharged home without test being run.

## 2011-05-24 NOTE — ED Provider Notes (Signed)
Evaluation and management procedures were performed by the PA/NP under my supervision/collaboration.   Laylamarie Meuser D Loistine Eberlin, MD 05/24/11 1018 

## 2011-05-30 ENCOUNTER — Ambulatory Visit (INDEPENDENT_AMBULATORY_CARE_PROVIDER_SITE_OTHER): Payer: Medicare Other | Admitting: Gastroenterology

## 2011-05-30 ENCOUNTER — Encounter: Payer: Self-pay | Admitting: Gastroenterology

## 2011-05-30 VITALS — BP 108/50 | HR 88 | Ht 68.0 in | Wt 263.1 lb

## 2011-05-30 DIAGNOSIS — K7460 Unspecified cirrhosis of liver: Secondary | ICD-10-CM

## 2011-05-30 NOTE — Assessment & Plan Note (Signed)
The patient clearly has advanced cirrhosis as evidenced by portal hypertension with history of bleeding esophageal varices, ascites, and hepatic encephalopathy. He currently is stable.  Recommendations #1 abdominal ultrasound to assess for ascites #2 continue current medications #3 upper endoscopy to assess for varices

## 2011-05-30 NOTE — Patient Instructions (Addendum)
You have been scheduled for an Upper Endoscopy W/banding at [facility] on 06/28/2011 @ 12:30pm Please arrive at 11:00am Please follow the instructions you were given today at your office visit.   You have been scheduled for an Abdominal U/S at [facility] on 06/01/2011 @ 8:15am at the  [address] [facility]  Location Nothing to eat or dink 6 hours prior to your appointment.  If you need to reschedule this appointment please call [phone]

## 2011-05-30 NOTE — Progress Notes (Signed)
History of Present Illness: Tony Boyd is a 56-year-old white male with history of cirrhosis, hepatitis C, alcohol abuse, variceal bleeding, hepatic encephalopathy and ascites referred at the request of Dr. Reed for evaluation. He has undergone band ligation in the past for bleeding varices. This apparently was 5 years ago. He was hospitalized in February, 2010 for hepatic encephalopathy. Renal insufficiency was noted. He currently is living in a rehabilitation center. He is alcohol-free. He has no specific complaints except for occasional body aches and fatigue.    Past Medical History  Diagnosis Date  . Cirrhosis of liver   . Hepatitis C   . ETOH abuse   . Hypothyroid   . Psychosis   . Bipolar disorder   . Seizures   . Arthritis   . Back pain   . Coagulopathy     Hx of  . Thrombocytopenia     Hx of  . Pancytopenia     Hx of  . H/O hypokalemia   . Hyponatremia     Hx of  . Korsakoff psychosis   . H/O abdominal abscess   . H/O esophageal varices   . Metabolic encephalopathy   . Hepatic encephalopathy   . Urinary urgency   . H/O renal failure   . H/O ascites   . Altered mental status   . Anemia    Past Surgical History  Procedure Date  . Colostomy   . Cholecystectomy   . Small intestine surgery   . Arm surgery     left arm  . Us guided drain placement in ventral hernia abscess 07/21/2010  . Multiple picc line placements    family history includes Heart disease in his mother and Lung cancer in his brother and sister. Current Outpatient Prescriptions  Medication Sig Dispense Refill  . ALPRAZolam (XANAX) 1 MG tablet Take 1 mg by mouth 2 (two) times daily as needed.      . ferrous sulfate 325 (65 FE) MG tablet Take 325 mg by mouth at bedtime.      . folic acid (FOLVITE) 1 MG tablet Take 1 mg by mouth daily.      . furosemide (LASIX) 40 MG tablet Take 40 mg by mouth daily.      . LACTULOSE PO Take 45 mLs by mouth daily. Take 5 times daily      . levETIRAcetam (KEPPRA) 500  MG tablet Take 500 mg by mouth 2 (two) times daily.      . levothyroxine (SYNTHROID, LEVOTHROID) 75 MCG tablet Take 75 mcg by mouth daily.      . Multiple Vitamin (MULTIVITAMIN) tablet Take 1 tablet by mouth daily.      . omeprazole (PRILOSEC) 20 MG capsule Take 20 mg by mouth daily.      . oxycodone (OXY-IR) 5 MG capsule Take 5 mg by mouth every 4 (four) hours as needed.       . potassium chloride (K-DUR) 10 MEQ tablet Take 10 mEq by mouth daily.      . rifaximin (XIFAXAN) 550 MG TABS Take 550 mg by mouth 2 (two) times daily.       . risperiDONE (RISPERDAL) 0.25 MG tablet Take 0.25 mg by mouth 2 (two) times daily.      . spironolactone (ALDACTONE) 100 MG tablet Take 100 mg by mouth daily.       Allergies as of 05/30/2011 - Review Complete 05/30/2011  Allergen Reaction Noted  . Droperidol Other (See Comments) 09/14/2010  . Ketorolac Other (See   Comments) 09/14/2010  . Penicillins Other (See Comments) 09/14/2010  . Toradol Other (See Comments) 05/08/2011    reports that he quit smoking about 3 months ago. His smoking use included Cigarettes. He has never used smokeless tobacco. He reports that he does not drink alcohol or use illicit drugs.     Review of Systems: Pertinent positive and negative review of systems were noted in the above HPI section. All other review of systems were otherwise negative.  Vital signs were reviewed in today's medical record Physical Exam: General: He is a chronically ill-appearing male Head: Normocephalic and atraumatic Eyes:  sclerae anicteric, EOMI Ears: Normal auditory acuity Mouth: No deformity or lesions Neck: Supple, no masses or thyromegaly Lungs: Clear throughout to auscultation Heart: Regular rate and rhythm; no murmurs, rubs or bruits Abdomen: Abdomen is obese and protuberant. Liver and spleen are not palpable. There are well healed surgical scars. There is bulging of the flanks suggestive of ascites. Rectal:deferred Musculoskeletal:  Symmetrical with no gross deformities  Skin: No lesions on visible extremities Pulses:  Normal pulses noted Extremities: No clubbing, cyanosis,  or deformities noted; he has bilateral brawny 2+ pedal edema Neurological: Alert oriented x 4, grossly nonfocal Cervical Nodes:  No significant cervical adenopathy Inguinal Nodes: No significant inguinal adenopathy Psychological:  Alert and cooperative. Normal mood and affect       

## 2011-06-01 ENCOUNTER — Other Ambulatory Visit: Payer: Medicare Other

## 2011-06-28 ENCOUNTER — Encounter (HOSPITAL_COMMUNITY): Payer: Self-pay | Admitting: *Deleted

## 2011-06-28 ENCOUNTER — Ambulatory Visit (HOSPITAL_COMMUNITY)
Admission: RE | Admit: 2011-06-28 | Discharge: 2011-06-28 | Disposition: A | Discharge status: Home / Self Care | Payer: Medicare Other | Source: Ambulatory Visit | Attending: Gastroenterology | Admitting: Gastroenterology

## 2011-06-28 ENCOUNTER — Encounter (HOSPITAL_COMMUNITY)
Admission: RE | Disposition: A | Discharge status: Home / Self Care | Payer: Self-pay | Source: Ambulatory Visit | Attending: Gastroenterology

## 2011-06-28 DIAGNOSIS — K7460 Unspecified cirrhosis of liver: Secondary | ICD-10-CM | POA: Insufficient documentation

## 2011-06-28 DIAGNOSIS — I8510 Secondary esophageal varices without bleeding: Secondary | ICD-10-CM | POA: Insufficient documentation

## 2011-06-28 DIAGNOSIS — Z79899 Other long term (current) drug therapy: Secondary | ICD-10-CM | POA: Insufficient documentation

## 2011-06-28 DIAGNOSIS — K766 Portal hypertension: Secondary | ICD-10-CM | POA: Insufficient documentation

## 2011-06-28 DIAGNOSIS — K319 Disease of stomach and duodenum, unspecified: Secondary | ICD-10-CM | POA: Insufficient documentation

## 2011-06-28 DIAGNOSIS — E039 Hypothyroidism, unspecified: Secondary | ICD-10-CM | POA: Insufficient documentation

## 2011-06-28 DIAGNOSIS — I8500 Esophageal varices without bleeding: Secondary | ICD-10-CM

## 2011-06-28 HISTORY — PX: ESOPHAGOGASTRODUODENOSCOPY: SHX5428

## 2011-06-28 SURGERY — EGD (ESOPHAGOGASTRODUODENOSCOPY)
Anesthesia: Moderate Sedation

## 2011-06-28 MED ORDER — FENTANYL NICU IV SYRINGE 50 MCG/ML
INJECTION | INTRAMUSCULAR | Status: DC | PRN
  Administered 2011-06-28 (×3): 25 ug via INTRAVENOUS

## 2011-06-28 MED ORDER — DIPHENHYDRAMINE HCL 50 MG/ML IJ SOLN
INTRAMUSCULAR | Status: DC | PRN
  Administered 2011-06-28: 25 mg via INTRAVENOUS

## 2011-06-28 MED ORDER — GLYCOPYRROLATE 0.2 MG/ML IJ SOLN
INTRAMUSCULAR | Status: DC | PRN
  Administered 2011-06-28: 0.2 mg via INTRAVENOUS

## 2011-06-28 MED ORDER — GLYCOPYRROLATE 0.2 MG/ML IJ SOLN
INTRAMUSCULAR | Status: AC
  Filled 2011-06-28: qty 1

## 2011-06-28 MED ORDER — BUTAMBEN-TETRACAINE-BENZOCAINE 2-2-14 % EX AERO
INHALATION_SPRAY | CUTANEOUS | Status: DC | PRN
  Administered 2011-06-28: 2 via TOPICAL

## 2011-06-28 MED ORDER — NADOLOL 40 MG PO TABS: 40.0000 mg | ORAL_TABLET | Freq: Every day | ORAL | Status: DC

## 2011-06-28 MED ORDER — MIDAZOLAM HCL 10 MG/2ML IJ SOLN
INTRAMUSCULAR | Status: AC
  Filled 2011-06-28: qty 2

## 2011-06-28 MED ORDER — MIDAZOLAM HCL 10 MG/2ML IJ SOLN
INTRAMUSCULAR | Status: DC | PRN
  Administered 2011-06-28 (×3): 2 mg via INTRAVENOUS

## 2011-06-28 MED ORDER — DIPHENHYDRAMINE HCL 50 MG/ML IJ SOLN
INTRAMUSCULAR | Status: AC
  Filled 2011-06-28: qty 1

## 2011-06-28 MED ORDER — FENTANYL CITRATE 0.05 MG/ML IJ SOLN
INTRAMUSCULAR | Status: AC
  Filled 2011-06-28: qty 2

## 2011-06-28 NOTE — H&P (View-Only) (Signed)
History of Present Illness: Tony Boyd is a 56 year old white male with history of cirrhosis, hepatitis C, alcohol abuse, variceal bleeding, hepatic encephalopathy and ascites referred at the request of Dr. Renato Gails for evaluation. He has undergone band ligation in the past for bleeding varices. This apparently was 5 years ago. He was hospitalized in February, 2010 for hepatic encephalopathy. Renal insufficiency was noted. He currently is living in a rehabilitation center. He is alcohol-free. He has no specific complaints except for occasional body aches and fatigue.    Past Medical History  Diagnosis Date  . Cirrhosis of liver   . Hepatitis C   . ETOH abuse   . Hypothyroid   . Psychosis   . Bipolar disorder   . Seizures   . Arthritis   . Back pain   . Coagulopathy     Hx of  . Thrombocytopenia     Hx of  . Pancytopenia     Hx of  . H/O hypokalemia   . Hyponatremia     Hx of  . Korsakoff psychosis   . H/O abdominal abscess   . H/O esophageal varices   . Metabolic encephalopathy   . Hepatic encephalopathy   . Urinary urgency   . H/O renal failure   . H/O ascites   . Altered mental status   . Anemia    Past Surgical History  Procedure Date  . Colostomy   . Cholecystectomy   . Small intestine surgery   . Arm surgery     left arm  . US guided drain placement in ventral hernia abscess 07/21/2010  . Multiple picc line placements    family history includes Heart disease in his mother and Lung cancer in his brother and sister. Current Outpatient Prescriptions  Medication Sig Dispense Refill  . ALPRAZolam (XANAX) 1 MG tablet Take 1 mg by mouth 2 (two) times daily as needed.      . ferrous sulfate 325 (65 FE) MG tablet Take 325 mg by mouth at bedtime.      . folic acid (FOLVITE) 1 MG tablet Take 1 mg by mouth daily.      . furosemide (LASIX) 40 MG tablet Take 40 mg by mouth daily.      [name] LACTULOSE PO Take 45 mLs by mouth daily. Take 5 times daily      . levETIRAcetam (KEPPRA) 500  MG tablet Take 500 mg by mouth 2 (two) times daily.      [name] levothyroxine (SYNTHROID, LEVOTHROID) 75 MCG tablet Take 75 mcg by mouth daily.      . Multiple Vitamin (MULTIVITAMIN) tablet Take 1 tablet by mouth daily.      [name] omeprazole (PRILOSEC) 20 MG capsule Take 20 mg by mouth daily.      [name] oxycodone (OXY-IR) 5 MG capsule Take 5 mg by mouth every 4 (four) hours as needed.       . potassium chloride (K-DUR) 10 MEQ tablet Take 10 mEq by mouth daily.      . rifaximin (XIFAXAN) 550 MG TABS Take 550 mg by mouth 2 (two) times daily.       . risperiDONE (RISPERDAL) 0.25 MG tablet Take 0.25 mg by mouth 2 (two) times daily.      [name] spironolactone (ALDACTONE) 100 MG tablet Take 100 mg by mouth daily.       Allergies as of 05/30/2011 - Review Complete 05/30/2011  Allergen Reaction Noted  . Droperidol Other (See Comments) 09/14/2010  . Ketorolac Other (See  Comments) 09/14/2010  . Penicillins Other (See Comments) 09/14/2010  . Toradol Other (See Comments) 05/08/2011    reports that he quit smoking about 3 months ago. His smoking use included Cigarettes. He has never used smokeless tobacco. He reports that he does not drink alcohol or use illicit drugs.     Review of Systems: Pertinent positive and negative review of systems were noted in the above HPI section. All other review of systems were otherwise negative.  Vital signs were reviewed in today's medical record Physical Exam: General: He is a chronically ill-appearing male Head: Normocephalic and atraumatic Eyes:  sclerae anicteric, EOMI Ears: Normal auditory acuity Mouth: No deformity or lesions Neck: Supple, no masses or thyromegaly Lungs: Clear throughout to auscultation Heart: Regular rate and rhythm; no murmurs, rubs or bruits Abdomen: Abdomen is obese and protuberant. Liver and spleen are not palpable. There are well healed surgical scars. There is bulging of the flanks suggestive of ascites. Rectal:deferred Musculoskeletal:  Symmetrical with no gross deformities  Skin: No lesions on visible extremities Pulses:  Normal pulses noted Extremities: No clubbing, cyanosis,  or deformities noted; he has bilateral brawny 2+ pedal edema Neurological: Alert oriented x 4, grossly nonfocal Cervical Nodes:  No significant cervical adenopathy Inguinal Nodes: No significant inguinal adenopathy Psychological:  Alert and cooperative. Normal mood and affect

## 2011-06-28 NOTE — Discharge Instructions (Signed)
Endoscopy Care After Please read the instructions outlined below and refer to this sheet in the next few weeks. These discharge instructions provide you with general information on caring for yourself after you leave the hospital. Your doctor may also give you specific instructions. While your treatment has been planned according to the most current medical practices available, unavoidable complications occasionally occur. If you have any problems or questions after discharge, please call your doctor. HOME CARE INSTRUCTIONS Activity  You may resume your regular activity but move at a slower pace for the next 24 hours.   Take frequent rest periods for the next 24 hours.   Walking will help expel (get rid of) the air and reduce the bloated feeling in your abdomen.   No driving for 24 hours (because of the anesthesia (medicine) used during the test).   You may shower.   Do not sign any important legal documents or operate any machinery for 24 hours (because of the anesthesia used during the test).  Nutrition  Drink plenty of fluids.   You may resume your normal diet.   Begin with a light meal and progress to your normal diet.   Avoid alcoholic beverages for 24 hours or as instructed by your caregiver.  Medications You may resume your normal medications unless your caregiver tells you otherwise. What you can expect today  You may experience abdominal discomfort such as a feeling of fullness or "gas" pains.   You may experience a sore throat for 2 to 3 days. This is normal. Gargling with salt water may help this.  Follow-up Your doctor will discuss the results of your test with you. SEEK IMMEDIATE MEDICAL CARE IF:  You have excessive nausea (feeling sick to your stomach) and/or vomiting.   You have severe abdominal pain and distention (swelling).   You have trouble swallowing.   You have a temperature over 100 F (37.8 C).   You have rectal bleeding or vomiting of blood.    Document Released: 09/27/2003 Document Revised: 02/01/2011 Document Reviewed: 04/09/2007 [employer] Patient Information 2012 [employer]. 

## 2011-06-28 NOTE — Op Note (Signed)
[facility] [address] [place], [place]  [address]  ENDOSCOPY PROCEDURE REPORT  PATIENT:  Tony Boyd, Tony Boyd  MR#:  [id number] BIRTHDATE:  12-10-1955, 56 yrs. old  GENDER:  male  ENDOSCOPIST:  Barbette Hair. Arlyce Dice, MD Referred by:  [name], M.D.  PROCEDURE DATE:  06/28/2011 PROCEDURE:  EGD, diagnostic 43235 ASA CLASS:  Class II INDICATIONS:  Evaluate for esophageal varices in a patient with portal hypertension and/or cirrhosis.  MEDICATIONS:   These medications were titrated to patient response per physician's verbal order, Fentanyl 75 mcg IV, Versed 6 mg IV, Benadryl 25 mg IV, glycopyrrolate (Robinal) 0.2 mg IV TOPICAL ANESTHETIC:  Cetacaine Spray  DESCRIPTION OF PROCEDURE:   After the risks and benefits of the procedure were explained, informed consent was obtained.  The endoscope was introduced through the mouth and advanced to the third portion of the duodenum.  The instrument was slowly withdrawn as the mucosa was fully examined. <<PROCEDUREIMAGES>>  Grade III varices were found in the distal esophagus (see image1, image6, and image7).  portal gastropathy (see image4).  Otherwise the examination was normal (see image2 and image3).    Retroflexed views revealed no abnormalities.    The scope was then withdrawn from the patient and the procedure completed.  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 Arlyce Dice, MD  CC:  n. eSIGNED:   Barbette Hair. Cledith Abdou at 06/28/2011 03:00 PM  [place], [phone]

## 2011-06-28 NOTE — Interval H&P Note (Signed)
History and Physical Interval Note:  06/28/2011 2:45 PM  Tony Boyd  has presented today for surgery, with the diagnosis of Cirrohsis  The various methods of treatment have been discussed with the patient and family. After consideration of risks, benefits and other options for treatment, the patient has consented to  Procedure(s) (LRB): ESOPHAGOGASTRODUODENOSCOPY (EGD) (N/A) as a surgical intervention .  The patients' history has been reviewed, patient examined, no change in status, stable for surgery.  I have reviewed the patients' chart and labs.  Questions were answered to the patient's satisfaction.     The recent H&P (dated *05/30/11**) was reviewed, the patient was examined and there is no change in the patients condition since that H&P was completed.   Melvia Heaps  06/28/2011, 2:45 PM   Melvia Heaps

## 2011-06-28 NOTE — Progress Notes (Signed)
Called to Endo to start iv; pt says he has "been stuck 6 or 7 times, by the nurses and anesthesia;"  Att  X 1 by myself with a 24 ga cath; veins appear more like capillaries;  Pt says he's had "several picc lines and ivs in my neck;"   Pt has edema in both lower legs and feet;  Very poor access; suggest ej or central access for pt; Staff aware;   Barkley Bruns RN IV Team,  [facility]

## 2011-06-29 ENCOUNTER — Encounter (HOSPITAL_COMMUNITY): Payer: Self-pay

## 2011-06-29 ENCOUNTER — Encounter (HOSPITAL_COMMUNITY): Payer: Self-pay | Admitting: Gastroenterology

## 2011-08-06 ENCOUNTER — Ambulatory Visit: Payer: Medicare Other | Admitting: Gastroenterology

## 2011-10-24 ENCOUNTER — Emergency Department (HOSPITAL_COMMUNITY): Payer: Medicare Other

## 2011-10-24 ENCOUNTER — Encounter (HOSPITAL_COMMUNITY): Payer: Self-pay | Admitting: *Deleted

## 2011-10-24 ENCOUNTER — Observation Stay (HOSPITAL_COMMUNITY)
Admission: EM | Admit: 2011-10-24 | Discharge: 2011-10-25 | Disposition: A | Discharge status: Home / Self Care | Payer: Medicare Other | Attending: Internal Medicine | Admitting: Internal Medicine

## 2011-10-24 DIAGNOSIS — R5381 Other malaise: Secondary | ICD-10-CM | POA: Insufficient documentation

## 2011-10-24 DIAGNOSIS — D689 Coagulation defect, unspecified: Secondary | ICD-10-CM | POA: Insufficient documentation

## 2011-10-24 DIAGNOSIS — K7290 Hepatic failure, unspecified without coma: Secondary | ICD-10-CM

## 2011-10-24 DIAGNOSIS — E871 Hypo-osmolality and hyponatremia: Secondary | ICD-10-CM | POA: Insufficient documentation

## 2011-10-24 DIAGNOSIS — K7460 Unspecified cirrhosis of liver: Secondary | ICD-10-CM | POA: Diagnosis present

## 2011-10-24 DIAGNOSIS — B1920 Unspecified viral hepatitis C without hepatic coma: Principal | ICD-10-CM | POA: Insufficient documentation

## 2011-10-24 DIAGNOSIS — R42 Dizziness and giddiness: Secondary | ICD-10-CM | POA: Insufficient documentation

## 2011-10-24 DIAGNOSIS — K7030 Alcoholic cirrhosis of liver without ascites: Secondary | ICD-10-CM | POA: Insufficient documentation

## 2011-10-24 DIAGNOSIS — Z79899 Other long term (current) drug therapy: Secondary | ICD-10-CM | POA: Insufficient documentation

## 2011-10-24 DIAGNOSIS — D649 Anemia, unspecified: Secondary | ICD-10-CM | POA: Insufficient documentation

## 2011-10-24 DIAGNOSIS — F1020 Alcohol dependence, uncomplicated: Secondary | ICD-10-CM | POA: Insufficient documentation

## 2011-10-24 DIAGNOSIS — R609 Edema, unspecified: Secondary | ICD-10-CM | POA: Insufficient documentation

## 2011-10-24 LAB — URINALYSIS, ROUTINE W REFLEX MICROSCOPIC
Glucose, UA: NEGATIVE mg/dL
Hgb urine dipstick: NEGATIVE
Leukocytes, UA: NEGATIVE
Protein, ur: NEGATIVE mg/dL
pH: 6 (ref 5.0–8.0)

## 2011-10-24 LAB — COMPREHENSIVE METABOLIC PANEL
ALT: 45 U/L (ref 0–53)
BUN: 12 mg/dL (ref 6–23)
CO2: 21 mEq/L (ref 19–32)
Calcium: 8.6 mg/dL (ref 8.4–10.5)
GFR calc Af Amer: >90 mL/min (ref >90)
GFR calc non Af Amer: >90 mL/min (ref >90)
Glucose, Bld: 117 mg/dL — ABNORMAL HIGH (ref 70–99)
Total Protein: 7.2 g/dL (ref 6.0–8.3)

## 2011-10-24 LAB — CBC WITH DIFFERENTIAL/PLATELET
Basophils Relative: 0 % (ref 0–1)
Eosinophils Relative: 4 % (ref 0–5)
HCT: 33.1 % — ABNORMAL LOW (ref 39.0–52.0)
Hemoglobin: 12 g/dL — ABNORMAL LOW (ref 13.0–17.0)
Lymphocytes Relative: 18 % (ref 12–46)
Lymphs Abs: 0.7 10*3/uL (ref 0.7–4.0)
MCV: 102.5 fL — ABNORMAL HIGH (ref 78.0–100.0)
Monocytes Relative: 13 % — ABNORMAL HIGH (ref 3–12)
Neutro Abs: 2.6 10*3/uL (ref 1.7–7.7)
RDW: 14.4 % (ref 11.5–15.5)
WBC: 4 10*3/uL (ref 4.0–10.5)

## 2011-10-24 LAB — PROTIME-INR: Prothrombin Time: 18.8 seconds — ABNORMAL HIGH (ref 11.6–15.2)

## 2011-10-24 LAB — APTT: aPTT: 32 seconds (ref 24–37)

## 2011-10-24 LAB — TYPE AND SCREEN: ABO/RH(D): A POS

## 2011-10-24 MED ORDER — LACTULOSE 10 GM/15ML PO SOLN
20.0000 g | Freq: Once | ORAL | Status: AC
  Administered 2011-10-24: 20 g via ORAL
  Filled 2011-10-24 (×2): qty 30

## 2011-10-24 MED ORDER — ONDANSETRON HCL 4 MG PO TABS: 4.0000 mg | ORAL_TABLET | Freq: Four times a day (QID) | ORAL | Status: DC | PRN

## 2011-10-24 MED ORDER — OXYCODONE HCL 5 MG PO CAPS: 5.0000 mg | ORAL_CAPSULE | ORAL | Status: DC | PRN

## 2011-10-24 MED ORDER — LEVOTHYROXINE SODIUM 75 MCG PO TABS
75.0000 ug | ORAL_TABLET | Freq: Every day | ORAL | Status: DC
  Administered 2011-10-24: 75 ug via ORAL
  Filled 2011-10-24 (×3): qty 1

## 2011-10-24 MED ORDER — RIFAXIMIN 550 MG PO TABS
550.0000 mg | ORAL_TABLET | Freq: Two times a day (BID) | ORAL | Status: DC
  Administered 2011-10-24 – 2011-10-25 (×2): 550 mg via ORAL
  Filled 2011-10-24 (×3): qty 1

## 2011-10-24 MED ORDER — POTASSIUM CHLORIDE ER 10 MEQ PO TBCR
10.0000 meq | EXTENDED_RELEASE_TABLET | Freq: Every day | ORAL | Status: DC
  Administered 2011-10-24 – 2011-10-25 (×2): 10 meq via ORAL
  Filled 2011-10-24 (×2): qty 1

## 2011-10-24 MED ORDER — ENOXAPARIN SODIUM 40 MG/0.4ML ~~LOC~~ SOLN
40.0000 mg | SUBCUTANEOUS | Status: DC
  Filled 2011-10-24: qty 0.4

## 2011-10-24 MED ORDER — FERROUS SULFATE 325 (65 FE) MG PO TABS
325.0000 mg | ORAL_TABLET | Freq: Every day | ORAL | Status: DC
  Administered 2011-10-24: 325 mg via ORAL
  Filled 2011-10-24 (×2): qty 1

## 2011-10-24 MED ORDER — PANTOPRAZOLE SODIUM 40 MG PO TBEC
40.0000 mg | DELAYED_RELEASE_TABLET | Freq: Every day | ORAL | Status: DC
  Administered 2011-10-25: 40 mg via ORAL
  Filled 2011-10-24: qty 1

## 2011-10-24 MED ORDER — FOLIC ACID 1 MG PO TABS
1.0000 mg | ORAL_TABLET | Freq: Every day | ORAL | Status: DC
  Administered 2011-10-24 – 2011-10-25 (×2): 1 mg via ORAL
  Filled 2011-10-24 (×2): qty 1

## 2011-10-24 MED ORDER — ADULT MULTIVITAMIN W/MINERALS CH
1.0000 | ORAL_TABLET | Freq: Every day | ORAL | Status: DC
  Administered 2011-10-24 – 2011-10-25 (×2): 1 via ORAL
  Filled 2011-10-24 (×2): qty 1

## 2011-10-24 MED ORDER — LACTULOSE 10 GM/15ML PO SOLN: 45.0000 g | Freq: Every day | ORAL | Status: DC

## 2011-10-24 MED ORDER — ALPRAZOLAM 1 MG PO TABS
1.0000 mg | ORAL_TABLET | Freq: Two times a day (BID) | ORAL | Status: DC | PRN
Start: 2011-10-24 — End: 2011-10-25
  Administered 2011-10-24 – 2011-10-25 (×2): 1 mg via ORAL
  Filled 2011-10-24 (×3): qty 1

## 2011-10-24 MED ORDER — LACTULOSE 10 GM/15ML PO SOLN
20.0000 g | Freq: Once | ORAL | Status: AC
  Administered 2011-10-24: 20 g via ORAL
  Filled 2011-10-24: qty 30

## 2011-10-24 MED ORDER — RISPERIDONE 0.25 MG PO TABS
0.2500 mg | ORAL_TABLET | Freq: Two times a day (BID) | ORAL | Status: DC
  Administered 2011-10-24 – 2011-10-25 (×2): 0.25 mg via ORAL
  Filled 2011-10-24 (×3): qty 1

## 2011-10-24 MED ORDER — OXYCODONE HCL 5 MG PO TABS
5.0000 mg | ORAL_TABLET | ORAL | Status: DC | PRN
  Administered 2011-10-24 – 2011-10-25 (×3): 5 mg via ORAL
  Filled 2011-10-24 (×3): qty 1

## 2011-10-24 MED ORDER — LACTULOSE 10 GM/15ML PO SOLN
30.0000 g | Freq: Every day | ORAL | Status: DC
  Administered 2011-10-24 – 2011-10-25 (×3): 30 g via ORAL
  Filled 2011-10-24 (×8): qty 45

## 2011-10-24 MED ORDER — SODIUM CHLORIDE 0.9 % IV SOLN: 250.0000 mL | INTRAVENOUS | Status: DC | PRN

## 2011-10-24 MED ORDER — SODIUM CHLORIDE 0.9 % IJ SOLN: 3.0000 mL | INTRAMUSCULAR | Status: DC | PRN

## 2011-10-24 MED ORDER — FUROSEMIDE 40 MG PO TABS
40.0000 mg | ORAL_TABLET | Freq: Every day | ORAL | Status: DC
  Administered 2011-10-24 – 2011-10-25 (×2): 40 mg via ORAL
  Filled 2011-10-24 (×2): qty 1

## 2011-10-24 MED ORDER — LEVETIRACETAM 500 MG PO TABS
500.0000 mg | ORAL_TABLET | Freq: Two times a day (BID) | ORAL | Status: DC
  Administered 2011-10-24 – 2011-10-25 (×2): 500 mg via ORAL
  Filled 2011-10-24 (×3): qty 1

## 2011-10-24 MED ORDER — SODIUM CHLORIDE 0.9 % IV SOLN: 1000.0000 mL | INTRAVENOUS | Status: DC

## 2011-10-24 MED ORDER — OXYCODONE HCL 5 MG PO TABS
ORAL_TABLET | ORAL | Status: AC
  Administered 2011-10-24: 5 mg
  Filled 2011-10-24: qty 1

## 2011-10-24 MED ORDER — NADOLOL 40 MG PO TABS
40.0000 mg | ORAL_TABLET | Freq: Every day | ORAL | Status: DC
  Administered 2011-10-24 – 2011-10-25 (×2): 40 mg via ORAL
  Filled 2011-10-24 (×2): qty 1

## 2011-10-24 MED ORDER — SODIUM CHLORIDE 0.9 % IJ SOLN: 3.0000 mL | Freq: Two times a day (BID) | INTRAMUSCULAR | Status: DC

## 2011-10-24 MED ORDER — ONDANSETRON HCL 4 MG/2ML IJ SOLN: 4.0000 mg | Freq: Four times a day (QID) | INTRAMUSCULAR | Status: DC | PRN

## 2011-10-24 MED ORDER — SPIRONOLACTONE 100 MG PO TABS
100.0000 mg | ORAL_TABLET | Freq: Every day | ORAL | Status: DC
  Administered 2011-10-24 – 2011-10-25 (×2): 100 mg via ORAL
  Filled 2011-10-24 (×2): qty 1

## 2011-10-24 NOTE — ED Notes (Signed)
Phlebotomy in room attempting to draw.

## 2011-10-24 NOTE — ED Notes (Signed)
Pt in by ems from assisted living. Pt takes a walk every day when he returned today, staff reports pt was confused, trying to enter someone elses apartment. Pt a&o x4 with ems. Hx cirrhosis, will become confused when ammonia levels are elevated. Ems reports pt is hypovolemic. 90pal initially. On arrival 109/61.

## 2011-10-24 NOTE — ED Notes (Signed)
IV team paged.  

## 2011-10-24 NOTE — ED Notes (Signed)
IV team states they are unable to start IV. MD aware

## 2011-10-24 NOTE — ED Provider Notes (Signed)
History     CSN: [id number]  Arrival date & time 10/24/11  1159   First MD Initiated Contact with Patient 10/24/11 1239      Chief Complaint  Patient presents with  . Weakness    HPI Pt in by ems from assisted living. Pt takes a walk every day when he returned today, staff reports pt was confused, trying to enter someone elses apartment.  Hx cirrhosis, will become confused when ammonia levels are elevated.  Pt states he felt weak and dizzy earlier today.  He is feeling better now.  He does not remember walking into the wrong room.  No headache.  No nausea or vomiting.  No chest pain or abdominal pain although his lower back hurts a little bit sometimes but not right now.  He feels pretty good right now after the IV fluids from EMS.  Past Medical History  Diagnosis Date  . Cirrhosis of liver   . Hepatitis C   . ETOH abuse   . Hypothyroid   . Psychosis   . Bipolar disorder   . Seizures   . Arthritis   . Back pain   . Coagulopathy     Hx of  . Thrombocytopenia     Hx of  . Pancytopenia     Hx of  . H/O hypokalemia   . Hyponatremia     Hx of  . Korsakoff psychosis   . H/O abdominal abscess   . H/O esophageal varices   . Metabolic encephalopathy   . Hepatic encephalopathy   . Urinary urgency   . H/O renal failure   . H/O ascites   . Altered mental status   . Anemia   . Ascites     Past Surgical History  Procedure Date  . Colostomy   . Cholecystectomy   . Small intestine surgery   . Arm surgery     left arm  . US guided drain placement in ventral hernia abscess 07/21/2010  . Multiple picc line placements   . Esophagogastroduodenoscopy 06/28/2011    Procedure: ESOPHAGOGASTRODUODENOSCOPY (EGD);  Surgeon: Louis Meckel, MD;  Location: [facility];  Service: Endoscopy;  Laterality: N/A;    Family History  Problem Relation Age of Onset  . Heart disease Mother     MI  . Lung cancer Sister   . Lung cancer Brother     History  Substance Use Topics  .  Smoking status: Former Smoker    Types: Cigarettes    Quit date: 02/15/2008  . Smokeless tobacco: Never Used  . Alcohol Use: No  Pt walks 4 times daily.    Review of Systems  All other systems reviewed and are negative.    Allergies  Droperidol; Ketorolac; Ketorolac tromethamine; and Penicillins  Home Medications   Current Outpatient Rx  Name Route Sig Dispense Refill  . ALPRAZOLAM 1 MG PO TABS Oral Take 1 mg by mouth 2 (two) times daily as needed.    [name] FERROUS SULFATE 325 (65 FE) MG PO TABS Oral Take 325 mg by mouth at bedtime.    [name] FOLIC ACID 1 MG PO TABS Oral Take 1 mg by mouth daily.    . FUROSEMIDE 40 MG PO TABS Oral Take 40 mg by mouth daily.    [name] LACTULOSE PO Oral Take 45 mLs by mouth daily. Take 5 times daily    . LEVETIRACETAM 500 MG PO TABS Oral Take 500 mg by mouth 2 (two) times daily.    [name]  LEVOTHYROXINE SODIUM 75 MCG PO TABS Oral Take 75 mcg by mouth daily.    [name] ONE-DAILY MULTI VITAMINS PO TABS Oral Take 1 tablet by mouth daily.    [name] NADOLOL 40 MG PO TABS Oral Take 1 tablet (40 mg total) by mouth daily. 30 tablet 2  . OMEPRAZOLE 20 MG PO CPDR Oral Take 20 mg by mouth daily.    . OXYCODONE HCL 5 MG PO CAPS Oral Take 5 mg by mouth every 4 (four) hours as needed.     [name] POTASSIUM CHLORIDE ER 10 MEQ PO TBCR Oral Take 10 mEq by mouth daily.    [name] RIFAXIMIN 550 MG PO TABS Oral Take 550 mg by mouth 2 (two) times daily.     [name] RISPERIDONE 0.25 MG PO TABS Oral Take 0.25 mg by mouth 2 (two) times daily.    [name] SPIRONOLACTONE 100 MG PO TABS Oral Take 100 mg by mouth daily.      BP 122/64  Pulse 59  Temp 97.6 F (36.4 C) (Oral)  Resp 12  SpO2 96%  Physical Exam  Nursing note and vitals reviewed. Constitutional: He is oriented to person, place, and time. No distress.       Obese   HENT:  Head: Normocephalic and atraumatic.  Right Ear: External ear normal.  Left Ear: External ear normal.  Mouth/Throat: No oropharyngeal exudate.  Eyes: Conjunctivae are normal. Right eye  exhibits no discharge. Left eye exhibits no discharge. No scleral icterus.  Neck: Neck supple. No tracheal deviation present.  Cardiovascular: Normal rate, regular rhythm and intact distal pulses.   Pulmonary/Chest: Effort normal and breath sounds normal. No stridor. No respiratory distress. He has no wheezes. He has no rales.  Abdominal: Soft. Bowel sounds are normal. He exhibits no distension. There is tenderness (very mild ruq). There is no rebound and no guarding.  Musculoskeletal: He exhibits edema. He exhibits no tenderness.  Neurological: He is alert and oriented to person, place, and time. He has normal strength. No cranial nerve deficit ( no gross defecits noted) or sensory deficit. He exhibits normal muscle tone. He displays no seizure activity. Coordination normal.       Movements slightly slow but able to move all extremities  Skin: Skin is warm and dry. No rash noted.       tan  Psychiatric: He has a normal mood and affect.    ED Course  Procedures (including critical care time)  Rate: 62  Rhythm: normal sinus rhythm  QRS Axis: normal  Intervals: normal  ST/T Wave abnormalities: normal  Conduction Disutrbances:none  Narrative Interpretation: borderline prolonged qt  Old EKG Reviewed: no significant change  Labs Reviewed  COMPREHENSIVE METABOLIC PANEL - Abnormal; Notable for the following:    Sodium 128 (*)     Glucose, Bld 117 (*)     Albumin 2.5 (*)     AST 83 (*)     Alkaline Phosphatase 150 (*)     Total Bilirubin 2.6 (*)     All other components within normal limits  PROTIME-INR - Abnormal; Notable for the following:    Prothrombin Time 18.8 (*)     INR 1.54 (*)     All other components within normal limits  URINALYSIS, ROUTINE W REFLEX MICROSCOPIC - Abnormal; Notable for the following:    Color, Urine AMBER (*)  BIOCHEMICALS MAY BE AFFECTED BY COLOR   Urobilinogen, UA 4.0 (*)     All other components within normal limits  CBC WITH  DIFFERENTIAL - Abnormal;  Notable for the following:    RBC 3.23 (*)     Hemoglobin 12.0 (*)     HCT 33.1 (*)     MCV 102.5 (*)     MCH 37.2 (*)     MCHC 36.3 (*)     Platelets 47 (*)     Monocytes Relative 13 (*)     All other components within normal limits  AMMONIA - Abnormal; Notable for the following:    Ammonia 143 (*)     All other components within normal limits  APTT  TYPE AND SCREEN   Dg Chest 2 View  10/24/2011  *RADIOLOGY REPORT*  Clinical Data: Weakness and dizziness.  Hepatitis C.  CHEST - 2 VIEW  Comparison: 05/08/2011  Findings: Heart size within normal limits. Low lung volumes are present, causing crowding of the pulmonary vasculature.  Airway thickening may reflect bronchitis or reactive airways disease.  No airspace opacity is identified to suggest bacterial pneumonia pattern.  No pleural effusion observed.  IMPRESSION:  1. Airway thickening may reflect bronchitis or reactive airways disease.  No airspace opacity is identified to suggest bacterial pneumonia pattern.   Original Report Authenticated By: Dellia Cloud, M.D.    Ct Head Wo Contrast  10/24/2011  *RADIOLOGY REPORT*  Clinical Data: Weakness.  CT HEAD WITHOUT CONTRAST  Technique:  Contiguous axial images were obtained from the base of the skull through the vertex without contrast.  Comparison: CT head 10/09/2010  Findings: Ventricle size is normal.  No mass or midline shift.  No acute infarct or hemorrhage.  No change from the prior study. Calvarium is intact.  IMPRESSION: No acute abnormality and no interval change.   Original Report Authenticated By: Camelia Phenes, M.D.    the  1. Hepatic encephalopathy     MDM  Pt with chronic liver disease.  Anemia, thrombocytopenia, and hyponatremia are baseline.  Pt is more alert but ammonia is significantly elevated.  Will start lactulose.  Admit for further treatment.       Celene Kras, MD 10/24/11 1710

## 2011-10-24 NOTE — ED Notes (Signed)
ZOX:[id number]<AV> Expected date:10/24/11<BR> Expected time:11:31 AM<BR> Means of arrival:Ambulance<BR> Comments:<BR> ALOC

## 2011-10-24 NOTE — H&P (Signed)
[facility] History and Physical  Tony Boyd ZOX:[id number] DOB: [birth year] DOA: 10/24/2011  Referring physician: Dr. Linwood Dibbles PCP: [name], DO   Chief Complaint: Altered mental status   History of Present Illness: Tony Boyd is an 56 y.o. male with a PMH of cirrhosis, hepatic encephalopathy maintained on high dose lactulose, and hepatitis C who was sent to the ER from his ALF for evaluation of confusion (he tried to enter another resident's apartment).  He also reported feeling weak and dizzy earlier today.  He does not have any recollection of the events that led to his being sent here, and now feels back to his usual baseline.  No current complaints.  Review of Systems: Constitutional: No fever, no chills;  Appetite normal; No weight loss, no weight gain.  HEENT: No blurry vision, no diplopia, no pharyngitis, no dysphagia CV: No chest pain, no palpitations.  Resp: No SOB, no cough. GI: No nausea, no vomiting, no diarrhea but does have frequent stools, no melena, no hematochezia.  GU: No dysuria, no hematuria.  MSK: no myalgias, + back pain.  Neuro:  No headache, no focal neurological deficits, no history of seizures.  Psych: + depression, + anxiety.  Endo: No thyroid disease, no DM, no heat intolerance, no cold intolerance, no polyuria, no polydipsia  Skin: No rashes, no skin lesions.  Heme: No easy bruising, no history of blood diseases.  Past Medical History Past Medical History  Diagnosis Date  . Cirrhosis of liver   . Hepatitis C   . ETOH abuse   . Hypothyroid   . Psychosis   . Bipolar disorder   . Seizures   . Arthritis   . Back pain   . Coagulopathy     Hx of  . Thrombocytopenia     Hx of  . Pancytopenia     Hx of  . H/O hypokalemia   . Hyponatremia     Hx of  . Korsakoff psychosis   . H/O abdominal abscess   . H/O esophageal varices   . Metabolic encephalopathy   . Hepatic encephalopathy   . Urinary urgency   . H/O renal failure   . H/O ascites     . Altered mental status   . Anemia   . Ascites      Past Surgical History Past Surgical History  Procedure Date  . Colostomy   . Cholecystectomy   . Small intestine surgery   . Arm surgery     left arm  . US guided drain placement in ventral hernia abscess 07/21/2010  . Multiple picc line placements   . Esophagogastroduodenoscopy 06/28/2011    Procedure: ESOPHAGOGASTRODUODENOSCOPY (EGD);  Surgeon: Louis Meckel, MD;  Location: [facility];  Service: Endoscopy;  Laterality: N/A;     Social History: History   Social History  . Marital Status: Single    Spouse Name: N/A    Number of Children: 2  . Years of Education: N/A   Occupational History  . disabled    Social History Main Topics  . Smoking status: Former Smoker    Types: Cigarettes    Quit date: 02/15/2008  . Smokeless tobacco: Never Used  . Alcohol Use: No  . Drug Use: No  . Sexually Active: No   Other Topics Concern  . Not on file   Social History Narrative   Resident of ALF.  States he is widowed.  Ambulates with a walker.    Family History:  Family History  Problem  Relation Age of Onset  . Heart disease Mother     MI  . Lung cancer Sister   . Lung cancer Brother     Allergies: Droperidol; Ketorolac; Ketorolac tromethamine; and Penicillins  Meds: Prior to Admission medications   Medication Sig Start Date End Date Taking? Authorizing Provider  ALPRAZolam Prudy Feeler) 1 MG tablet Take 1 mg by mouth 2 (two) times daily as needed.   Yes Historical Provider, MD  ferrous sulfate 325 (65 FE) MG tablet Take 325 mg by mouth at bedtime.   Yes Historical Provider, MD  folic acid (FOLVITE) 1 MG tablet Take 1 mg by mouth daily.   Yes Historical Provider, MD  furosemide (LASIX) 40 MG tablet Take 40 mg by mouth daily.   Yes Historical Provider, MD  LACTULOSE PO Take 45 mLs by mouth daily. Take 5 times daily   Yes Historical Provider, MD  levETIRAcetam (KEPPRA) 500 MG tablet Take 500 mg by mouth 2 (two) times  daily.   Yes Historical Provider, MD  levothyroxine (SYNTHROID, LEVOTHROID) 75 MCG tablet Take 75 mcg by mouth daily.   Yes Historical Provider, MD  Multiple Vitamin (MULTIVITAMIN) tablet Take 1 tablet by mouth daily.   Yes Historical Provider, MD  nadolol (CORGARD) 40 MG tablet Take 1 tablet (40 mg total) by mouth daily. 06/28/11 06/27/12 Yes Louis Meckel, MD  omeprazole (PRILOSEC) 20 MG capsule Take 20 mg by mouth daily.   Yes Historical Provider, MD  oxycodone (OXY-IR) 5 MG capsule Take 5 mg by mouth every 4 (four) hours as needed.    Yes Historical Provider, MD  potassium chloride (K-DUR) 10 MEQ tablet Take 10 mEq by mouth daily.   Yes Historical Provider, MD  rifaximin (XIFAXAN) 550 MG TABS Take 550 mg by mouth 2 (two) times daily.    Yes Historical Provider, MD  risperiDONE (RISPERDAL) 0.25 MG tablet Take 0.25 mg by mouth 2 (two) times daily.   Yes Historical Provider, MD  spironolactone (ALDACTONE) 100 MG tablet Take 100 mg by mouth daily.   Yes Historical Provider, MD    Physical Exam: Filed Vitals:   10/24/11 1405 10/24/11 1539 10/24/11 1601 10/24/11 1700  BP: 103/61 118/64 116/65 117/57  Pulse: 63 66 67 63  Temp: 97.8 F (36.6 C)  98.7 F (37.1 C)   TempSrc: Oral Oral Oral   Resp: 18 16 16 14   SpO2: 99% 100% 96% 100%     Physical Exam: Blood pressure 117/57, pulse 63, temperature 98.7 F (37.1 C), temperature source Oral, resp. rate 14, SpO2 100.00%. BP 117/57  Pulse 63  Temp 98.7 F (37.1 C) (Oral)  Resp 14  SpO2 100%  General Appearance:    Alert, cooperative, no distress, appears stated age  Head:    Normocephalic, without obvious abnormality, atraumatic  Eyes:    PERRL, conjunctiva/corneas icteric, EOM's intact  Ears:    Normal external ear canals, both ears  Nose:   Nares normal, septum midline, mucosa normal, no drainage    or sinus tenderness  Throat:   Lips, mucosa, and tongue normal; fair dentition  Neck:   Supple, symmetrical, trachea midline, no  adenopathy;       thyroid:  No enlargement/tenderness/nodules; no carotid   bruit or JVD  Back:     Symmetric, no curvature, ROM normal, no CVA tenderness  Lungs:     Clear to auscultation bilaterally, respirations unlabored  Chest wall:    No tenderness or deformity  Heart:    Regular rate  and rhythm, S1 and S2 normal, no murmur, rub   or gallop  Abdomen:     Softly distended, non-tender, bowel sounds active all four quadrants, no masses, no organomegaly  Extremities:   Extremities with 2+ edema  Pulses:   2+ and symmetric all extremities  Skin:   Skin color, texture, turgor normal, no rashes or lesions  Neurologic:   CNII-XII intact. Non-focal.    Labs on Admission:  Basic Metabolic Panel:  Lab 10/24/11 1478  NA 128*  K 4.4  CL 98  CO2 21  GLUCOSE 117*  BUN 12  CREATININE 0.85  CALCIUM 8.6  MG --  PHOS --   Liver Function Tests:  Lab 10/24/11 1505  AST 83*  ALT 45  ALKPHOS 150*  BILITOT 2.6*  PROT 7.2  ALBUMIN 2.5*    Lab 10/24/11 1505  AMMONIA 143*   CBC:  Lab 10/24/11 1505  WBC 4.0  NEUTROABS 2.6  HGB 12.0*  HCT 33.1*  MCV 102.5*  PLT 47*   Radiological Exams on Admission: Dg Chest 2 View  10/24/2011  *RADIOLOGY REPORT*  Clinical Data: Weakness and dizziness.  Hepatitis C.  CHEST - 2 VIEW  Comparison: 05/08/2011  Findings: Heart size within normal limits. Low lung volumes are present, causing crowding of the pulmonary vasculature.  Airway thickening may reflect bronchitis or reactive airways disease.  No airspace opacity is identified to suggest bacterial pneumonia pattern.  No pleural effusion observed.  IMPRESSION:  1. Airway thickening may reflect bronchitis or reactive airways disease.  No airspace opacity is identified to suggest bacterial pneumonia pattern.   Original Report Authenticated By: Dellia Cloud, M.D.    Ct Head Wo Contrast  10/24/2011  *RADIOLOGY REPORT*  Clinical Data: Weakness.  CT HEAD WITHOUT CONTRAST  Technique:  Contiguous  axial images were obtained from the base of the skull through the vertex without contrast.  Comparison: CT head 10/09/2010  Findings: Ventricle size is normal.  No mass or midline shift.  No acute infarct or hemorrhage.  No change from the prior study. Calvarium is intact.  IMPRESSION: No acute abnormality and no interval change.   Original Report Authenticated By: Camelia Phenes, M.D.     EKG: Independently reviewed. SINUS RHYTHM ~ normal P axis, V-rate 50- 99; BORDERLINE PROLONGED QT INTERVAL ~ QTc >414mS  Assessment/Plan Principal Problem:  *Hepatic encephalopathy  Appears to be largely resolved, but will admit for 23 hour observation to ensure stability.  Continue Lactulose 45 g 5 x a day and Rifaximin.  Ammonia levels were elevated over usual baseline. Active Problems:  Coagulopathy  No evidence of bleeding complication.  Hemoglobin stable.  Hyponatremia  Secondary to cirrhosis physiology, continue diuretics.  Cirrhosis  Secondary to hepatitis C and history of ETOH abuse.  Bilirubin is actually lower today.   Code Status: Full. Family Communication: None at bedside. Disposition Plan: Back to ALF in 23 hours if stable.  Time spent: 45 minutes.  Kamaryn Grimley [facility] Pager [phone]  If 7PM-7AM, please contact night-coverage [email or web address] Password [facility] 10/24/2011, 6:32 PM

## 2011-10-24 NOTE — ED Notes (Signed)
IV team RN at bedside.  

## 2011-10-25 LAB — BASIC METABOLIC PANEL
CO2: 19 mEq/L (ref 19–32)
Calcium: 8.9 mg/dL (ref 8.4–10.5)
Creatinine, Ser: <0.2 mg/dL — ABNORMAL LOW (ref 0.50–1.35)
Sodium: 128 mEq/L — ABNORMAL LOW (ref 135–145)

## 2011-10-25 NOTE — Care Management Note (Signed)
    Page 1 of 1   10/25/2011     11:29:37 AM   CARE MANAGEMENT NOTE 10/25/2011  Patient:  Tony Boyd, Tony Boyd   Account Number:  [id number]  Date Initiated:  10/25/2011  Documentation initiated by:  CRAFT,TERRI  Subjective/Objective Assessment:   56 yo male admitted 10/24/11 with hepatic encephalopathy     Action/Plan:   D/C when medically stable   Anticipated DC Date:  10/25/2011   Anticipated DC Plan:  HOME W HOME HEALTH SERVICES  In-house referral  Clinical Social Worker      DC Associate Professor  CM consult      [facility] Choice  HOME HEALTH   Choice offered to / List presented to:  [name]        HH arranged  HH-1 RN      [facility] agency  [facility]   Status of service:  Completed, signed off    Discharge Disposition:  HOME W HOME HEALTH SERVICES  Per UR Regulation:  Reviewed for med. necessity/level of care/duration of stay  Comments:  10/25/11, Kathi Der RNC-MNN, BSN, [phone], CM received referral.  CM met with pt and offered choice for [facility] services.  Pt states he wants to use [facility] again for [facility] services.  Mary at [employer] notified of orders and confirmation of services received.

## 2011-10-25 NOTE — Discharge Summary (Signed)
Physician Discharge Summary  Tony Boyd ZOX:[id number] DOB: [birth year] DOA: 10/24/2011  PCP: [name], DO  Admit date: 10/24/2011 Discharge date: 10/25/2011  Recommendations for Outpatient Follow-up:  1. Home health RN to assess safety/ability to safely take home medications without supervision.  Discharge Diagnoses:   Principal Problem:  *Hepatic encephalopathy  Active Problems:   Coagulopathy   Hyponatremia   Cirrhosis   Anemia     Discharge Condition: Stable.  Diet recommendation: Low-sodium, heart healthy.  History of present illness:  Tony Boyd is an 56 y.o. male with a PMH of cirrhosis, hepatic encephalopathy maintained on high dose lactulose, and hepatitis C who was sent to the ER from his ALF for evaluation of confusion.  Hospital Course by problem:  Principal Problem:  *Hepatic encephalopathy  Admitted for observation since his mentation had improved by the time he was evaluated for admission.  Encephalopathic episodes appear to be from hyperammonemia related to possibly not remembering to take his lactulose as scheduled.  No further deterioration of mental status during his observation period noted.  Plan is to discharge him with a home health nurse for safety evaluation to determine if there are strategies that can be put in placed to help remind him to take his medications as prescribed. Active Problems:  Coagulopathy / anemia   No evidence of bleeding complications while in the hospital. Hemoglobin was stable. Hyponatremia  Secondary to cirrhosis physiology. Continue diuretics.  Cirrhosis  Secondary to hepatitis C and a history of alcohol abuse.  Bilirubin has improved over prior values.  Procedures:  None.  Consultations:  Physical therapy: Ambulate with rolling walker.  Discharge Exam: Filed Vitals:   10/25/11 0630  BP: 126/65  Pulse: 70  Temp: 97.3 F (36.3 C)  Resp: 17   Filed Vitals:   10/24/11 1700 10/24/11 2026  10/24/11 2035 10/25/11 0630  BP: 117/57 113/71 125/62 126/65  Pulse: 63 68 65 70  Temp:   97.7 F (36.5 C) 97.3 F (36.3 C)  TempSrc:   Oral Oral  Resp: 14 16 18 17   Height:   5\' 8"  (1.727 m)   Weight:   120.7 kg (266 lb 1.5 oz)   SpO2: 100% 100% 99% 96%    Gen:  NAD Cardiovascular:  RRR, No M/R/G Respiratory: Lungs CTAB Gastrointestinal: Abdomen softly distended, NT/ND with normal active bowel sounds. Extremities: + edema   Discharge Instructions  Discharge Orders    Future Orders Please Complete By Expires   Diet - low sodium heart healthy      Increase activity slowly      Dan Humphreys       Call MD for:      Scheduling Instructions:   Ongoing problems with confusion, extreme tiredness.   Home Health      Questions: Responses:   To provide the following care/treatments RN   Face-to-face encounter      Comments:   I RAMA,CHRISTINA certify that this patient is under my care and that I, or a nurse practitioner or physician's assistant working with me, had a face-to-face encounter that meets the physician face-to-face encounter requirements with this patient on 10/25/2011.   Questions: Responses:   The encounter with the patient was in whole, or in part, for the following medical condition, which is the primary reason for home health care Encephalopathy   I certify that, based on my findings, the following services are medically necessary home health services Nursing   My clinical findings support the need for the above  services High Risk for rehospitalization   Further, I certify that my clinical findings support that this patient is homebound due to: Unable to leave home safely without assistance   To provide the following care/treatments RN     Medication List  As of 10/25/2011 10:29 AM   TAKE these medications         ALPRAZolam 1 MG tablet   Commonly known as: XANAX   Take 1 mg by mouth 2 (two) times daily as needed.      ferrous sulfate 325 (65 FE) MG tablet   Take  325 mg by mouth at bedtime.      folic acid 1 MG tablet   Commonly known as: FOLVITE   Take 1 mg by mouth daily.      furosemide 40 MG tablet   Commonly known as: LASIX   Take 40 mg by mouth daily.      LACTULOSE PO   Take 45 mLs by mouth daily. Take 5 times daily      levETIRAcetam 500 MG tablet   Commonly known as: KEPPRA   Take 500 mg by mouth 2 (two) times daily.      levothyroxine 75 MCG tablet   Commonly known as: SYNTHROID, LEVOTHROID   Take 75 mcg by mouth daily.      multivitamin tablet   Take 1 tablet by mouth daily.      nadolol 40 MG tablet   Commonly known as: CORGARD   Take 1 tablet (40 mg total) by mouth daily.      omeprazole 20 MG capsule   Commonly known as: PRILOSEC   Take 20 mg by mouth daily.      oxycodone 5 MG capsule   Commonly known as: OXY-IR   Take 5 mg by mouth every 4 (four) hours as needed.      potassium chloride 10 MEQ tablet   Commonly known as: K-DUR   Take 10 mEq by mouth daily.      rifaximin 550 MG Tabs   Commonly known as: XIFAXAN   Take 550 mg by mouth 2 (two) times daily.      risperiDONE 0.25 MG tablet   Commonly known as: RISPERDAL   Take 0.25 mg by mouth 2 (two) times daily.      spironolactone 100 MG tablet   Commonly known as: ALDACTONE   Take 100 mg by mouth daily.              The results of significant diagnostics from this hospitalization (including imaging, microbiology, ancillary and laboratory) are listed below for reference.    Significant Diagnostic Studies: Dg Chest 2 View  10/24/2011  *RADIOLOGY REPORT*  Clinical Data: Weakness and dizziness.  Hepatitis C.  CHEST - 2 VIEW  Comparison: 05/08/2011  Findings: Heart size within normal limits. Low lung volumes are present, causing crowding of the pulmonary vasculature.  Airway thickening may reflect bronchitis or reactive airways disease.  No airspace opacity is identified to suggest bacterial pneumonia pattern.  No pleural effusion observed.   IMPRESSION:  1. Airway thickening may reflect bronchitis or reactive airways disease.  No airspace opacity is identified to suggest bacterial pneumonia pattern.   Original Report Authenticated By: Dellia Cloud, M.D.    Ct Head Wo Contrast  10/24/2011  *RADIOLOGY REPORT*  Clinical Data: Weakness.  CT HEAD WITHOUT CONTRAST  Technique:  Contiguous axial images were obtained from the base of the skull through the vertex without contrast.  Comparison: CT  head 10/09/2010  Findings: Ventricle size is normal.  No mass or midline shift.  No acute infarct or hemorrhage.  No change from the prior study. Calvarium is intact.  IMPRESSION: No acute abnormality and no interval change.   Original Report Authenticated By: Camelia Phenes, M.D.     Microbiology: No results found for this or any previous visit (from the past 240 hour(s)).   Labs: Basic Metabolic Panel:  Lab 10/25/11 4098 10/24/11 1505  NA 128* 128*  K 4.7 4.4  CL 97 98  CO2 19 21  GLUCOSE 139* 117*  BUN 12 12  CREATININE <0.20* 0.85  CALCIUM 8.9 8.6  MG -- --  PHOS -- --   Liver Function Tests:  Lab 10/24/11 1505  AST 83*  ALT 45  ALKPHOS 150*  BILITOT 2.6*  PROT 7.2  ALBUMIN 2.5*    Lab 10/24/11 1505  AMMONIA 143*   CBC:  Lab 10/24/11 1505  WBC 4.0  NEUTROABS 2.6  HGB 12.0*  HCT 33.1*  MCV 102.5*  PLT 47*    Time coordinating discharge: 30 minutes.  Signed:  RAMA,CHRISTINA  Pager [phone] [facility] 10/25/2011, 10:29 AM

## 2011-10-25 NOTE — Evaluation (Signed)
Physical Therapy Evaluation Patient Details Name: Tony Boyd MRN: [id number] DOB: 06/09/1955 Today's Date: 10/25/2011 Time: 0865-7846 PT Time Calculation (min): 17 min  PT Assessment / Plan / Recommendation Clinical Impression  Pt is independent with RW He does not need follow up PT or DME    PT Assessment  Patent does not need any further PT services    Follow Up Recommendations  No PT follow up    Barriers to Discharge        Equipment Recommendations       Recommendations for Other Services     Frequency      Precautions / Restrictions     Pertinent Vitals/Pain Pt states he always has back and hip pain     Mobility  Bed Mobility Bed Mobility: Supine to Sit;Sit to Supine Supine to Sit: 6: Modified independent (Device/Increase time) Sit to Supine: 6: Modified independent (Device/Increase time) Transfers Transfers: Sit to Stand;Stand to Sit Sit to Stand: 6: Modified independent (Device/Increase time) Stand to Sit: 6: Modified independent (Device/Increase time) Ambulation/Gait Ambulation/Gait Assistance: 6: Modified independent (Device/Increase time) Ambulation Distance (Feet): 200 Feet Assistive device: Rolling walker Gait Pattern: Within Functional Limits;Trunk flexed Gait velocity: WFL General Gait Details: pt is comfortable walking with RW Stairs: No Wheelchair Mobility Wheelchair Mobility: No    Exercises     PT Diagnosis:    PT Problem List:   PT Treatment Interventions:     PT Goals    Visit Information  Last PT Received On: 10/25/11 Assistance Needed: +1    Subjective Data  Subjective: I use a RW or a cane Patient Stated Goal: to walk   Prior Functioning  Home Living Type of Home: Assisted living Prior Function Level of Independence: Independent with assistive device(s) (used cane or RW) Communication Communication: No difficulties    Cognition  Overall Cognitive Status: Appears within functional limits for tasks  assessed/performed Arousal/Alertness: Awake/alert Orientation Level: Appears intact for tasks assessed Behavior During Session: [facility] for tasks performed    Extremity/Trunk Assessment Right Lower Extremity Assessment RLE ROM/Strength/Tone: Within functional levels (pt reports arthritis and pain in hip) Left Lower Extremity Assessment LLE ROM/Strength/Tone: Within functional levels Trunk Assessment Trunk Assessment: Other exceptions Trunk Exceptions: obese pt reports pain in back   Balance Balance Balance Assessed: No  End of Session PT - End of Session Activity Tolerance: Patient tolerated treatment well Patient left: in chair (with MD in room) Nurse Communication: Mobility status  GP     Donnetta Hail 10/25/2011, 12:12 PM

## 2011-10-25 NOTE — Progress Notes (Signed)
While interviewing patient for admission history I could tell that he was not a good historian and probably confused about his abilities with ADLs etc. I have decided to wait to speak with the daughter, or have day shift speak with daughter regarding history so as to obtain accurate information.

## 2011-10-25 NOTE — Clinical Social Work Psychosocial (Signed)
     Clinical Social Work Department BRIEF PSYCHOSOCIAL ASSESSMENT 10/25/2011  Patient:  Tony Boyd, Tony Boyd     Account Number:  [id number]     Admit date:  10/24/2011  Clinical Social Worker:  Robin Searing  Date/Time:  10/25/2011 10:00 AM  Referred by:  Physician  Date Referred:  10/25/2011 Referred for  Other - See comment   Other Referral:   Referred as from ALF but is actually from an [facility].   Interview type:  Patient Other interview type:    PSYCHOSOCIAL DATA Living Status:  ALONE Admitted from facility:   Level of care:   Primary support name:  daughter Primary support relationship to patient:  FAMILY Degree of support available:   good    CURRENT CONCERNS Current Concerns  Other - See comment   Other Concerns:    SOCIAL WORK ASSESSMENT / PLAN Patient tells me he plans to return to the [facility] at d/c. He lives alone there and is independent. He tells me gets up and walks a lot outside during the day as he is not one to be lazy- He sometimes forgets to take his RX (Lactulose 5x day) but has a pill container for all his other meds which he takes each AM and each PM.   Assessment/plan status:  Other - See comment Other assessment/ plan:   Information/referral to community resources:   [facility]    PATIENTS/FAMILYS RESPONSE TO PLAN OF CARE: Patient agrees to this plan -encouraged him to consider setting an alarm on his phone or watch to remind him to take the RX.  Advised Dr. Darnelle Catalan of above and she will consider [facility] RN f/u at d/c.

## 2012-01-23 ENCOUNTER — Encounter (HOSPITAL_COMMUNITY): Payer: Self-pay | Admitting: *Deleted

## 2012-01-23 ENCOUNTER — Observation Stay (HOSPITAL_COMMUNITY)
Admission: EM | Admit: 2012-01-23 | Discharge: 2012-01-26 | Disposition: A | Discharge status: Home / Self Care | Payer: Medicare Other | Attending: Internal Medicine | Admitting: Internal Medicine

## 2012-01-23 DIAGNOSIS — B1921 Unspecified viral hepatitis C with hepatic coma: Principal | ICD-10-CM | POA: Insufficient documentation

## 2012-01-23 DIAGNOSIS — K7460 Unspecified cirrhosis of liver: Secondary | ICD-10-CM | POA: Insufficient documentation

## 2012-01-23 DIAGNOSIS — D696 Thrombocytopenia, unspecified: Secondary | ICD-10-CM | POA: Insufficient documentation

## 2012-01-23 DIAGNOSIS — K7290 Hepatic failure, unspecified without coma: Secondary | ICD-10-CM | POA: Diagnosis present

## 2012-01-23 DIAGNOSIS — F1010 Alcohol abuse, uncomplicated: Secondary | ICD-10-CM | POA: Insufficient documentation

## 2012-01-23 DIAGNOSIS — M549 Dorsalgia, unspecified: Secondary | ICD-10-CM | POA: Insufficient documentation

## 2012-01-23 DIAGNOSIS — M545 Low back pain, unspecified: Secondary | ICD-10-CM | POA: Insufficient documentation

## 2012-01-23 DIAGNOSIS — D1809 Hemangioma of other sites: Secondary | ICD-10-CM | POA: Insufficient documentation

## 2012-01-23 DIAGNOSIS — N390 Urinary tract infection, site not specified: Secondary | ICD-10-CM | POA: Diagnosis present

## 2012-01-23 DIAGNOSIS — D649 Anemia, unspecified: Secondary | ICD-10-CM

## 2012-01-23 DIAGNOSIS — D689 Coagulation defect, unspecified: Secondary | ICD-10-CM | POA: Insufficient documentation

## 2012-01-23 DIAGNOSIS — I8510 Secondary esophageal varices without bleeding: Secondary | ICD-10-CM | POA: Insufficient documentation

## 2012-01-23 DIAGNOSIS — D638 Anemia in other chronic diseases classified elsewhere: Secondary | ICD-10-CM | POA: Insufficient documentation

## 2012-01-23 DIAGNOSIS — I8500 Esophageal varices without bleeding: Secondary | ICD-10-CM | POA: Diagnosis present

## 2012-01-23 DIAGNOSIS — R5381 Other malaise: Secondary | ICD-10-CM | POA: Insufficient documentation

## 2012-01-23 DIAGNOSIS — E46 Unspecified protein-calorie malnutrition: Secondary | ICD-10-CM | POA: Insufficient documentation

## 2012-01-23 DIAGNOSIS — E871 Hypo-osmolality and hyponatremia: Secondary | ICD-10-CM | POA: Insufficient documentation

## 2012-01-23 DIAGNOSIS — B1920 Unspecified viral hepatitis C without hepatic coma: Secondary | ICD-10-CM | POA: Diagnosis present

## 2012-01-23 DIAGNOSIS — Z79899 Other long term (current) drug therapy: Secondary | ICD-10-CM | POA: Insufficient documentation

## 2012-01-23 LAB — URINALYSIS, ROUTINE W REFLEX MICROSCOPIC
Glucose, UA: NEGATIVE mg/dL
Hgb urine dipstick: NEGATIVE
Ketones, ur: NEGATIVE mg/dL
Protein, ur: NEGATIVE mg/dL
Urobilinogen, UA: 1 mg/dL (ref 0.0–1.0)

## 2012-01-23 LAB — CBC WITH DIFFERENTIAL/PLATELET
Basophils Relative: 0 % (ref 0–1)
Eosinophils Absolute: 0 10*3/uL (ref 0.0–0.7)
Eosinophils Relative: 1 % (ref 0–5)
HCT: 37.6 % — ABNORMAL LOW (ref 39.0–52.0)
Hemoglobin: 13.6 g/dL (ref 13.0–17.0)
MCH: 36.4 pg — ABNORMAL HIGH (ref 26.0–34.0)
MCHC: 36.2 g/dL — ABNORMAL HIGH (ref 30.0–36.0)
Monocytes Absolute: 0.4 10*3/uL (ref 0.1–1.0)
Neutro Abs: 3.9 10*3/uL (ref 1.7–7.7)
Neutrophils Relative %: 80 % — ABNORMAL HIGH (ref 43–77)
RBC: 3.74 MIL/uL — ABNORMAL LOW (ref 4.22–5.81)

## 2012-01-23 LAB — TYPE AND SCREEN
ABO/RH(D): A POS
Antibody Screen: NEGATIVE

## 2012-01-23 LAB — COMPREHENSIVE METABOLIC PANEL
ALT: 72 U/L — ABNORMAL HIGH (ref 0–53)
AST: 109 U/L — ABNORMAL HIGH (ref 0–37)
Albumin: 2.7 g/dL — ABNORMAL LOW (ref 3.5–5.2)
Alkaline Phosphatase: 188 U/L — ABNORMAL HIGH (ref 39–117)
Calcium: 9.3 mg/dL (ref 8.4–10.5)
GFR calc Af Amer: >90 mL/min (ref >90)
Potassium: 4.1 mEq/L (ref 3.5–5.1)
Sodium: 128 mEq/L — ABNORMAL LOW (ref 135–145)
Total Protein: 8 g/dL (ref 6.0–8.3)

## 2012-01-23 LAB — PROTIME-INR
INR: 2.41 — ABNORMAL HIGH (ref 0.00–1.49)
Prothrombin Time: 25.1 seconds — ABNORMAL HIGH (ref 11.6–15.2)

## 2012-01-23 LAB — ETHANOL: Alcohol, Ethyl (B): <11 mg/dL (ref 0–11)

## 2012-01-23 MED ORDER — OXYCODONE HCL 5 MG PO TABS
5.0000 mg | ORAL_TABLET | ORAL | Status: DC | PRN
  Administered 2012-01-23 – 2012-01-26 (×15): 5 mg via ORAL
  Filled 2012-01-23 (×16): qty 1

## 2012-01-23 MED ORDER — POTASSIUM CHLORIDE ER 10 MEQ PO TBCR
10.0000 meq | EXTENDED_RELEASE_TABLET | Freq: Every day | ORAL | Status: DC
  Administered 2012-01-24 – 2012-01-26 (×3): 10 meq via ORAL
  Filled 2012-01-23 (×3): qty 1

## 2012-01-23 MED ORDER — PANTOPRAZOLE SODIUM 40 MG PO TBEC
40.0000 mg | DELAYED_RELEASE_TABLET | Freq: Every day | ORAL | Status: DC
  Administered 2012-01-24 – 2012-01-26 (×3): 40 mg via ORAL
  Filled 2012-01-23 (×3): qty 1

## 2012-01-23 MED ORDER — FUROSEMIDE 40 MG PO TABS
40.0000 mg | ORAL_TABLET | Freq: Two times a day (BID) | ORAL | Status: DC
  Administered 2012-01-24 – 2012-01-26 (×6): 40 mg via ORAL
  Filled 2012-01-23 (×10): qty 1

## 2012-01-23 MED ORDER — FOLIC ACID 1 MG PO TABS
1.0000 mg | ORAL_TABLET | Freq: Every day | ORAL | Status: DC
Start: 2012-01-24 — End: 2012-01-26
  Administered 2012-01-24 – 2012-01-26 (×3): 1 mg via ORAL
  Filled 2012-01-23 (×3): qty 1

## 2012-01-23 MED ORDER — ADULT MULTIVITAMIN W/MINERALS CH
1.0000 | ORAL_TABLET | Freq: Every day | ORAL | Status: DC
  Administered 2012-01-24 – 2012-01-26 (×3): 1 via ORAL
  Filled 2012-01-23 (×3): qty 1

## 2012-01-23 MED ORDER — ONDANSETRON HCL 4 MG PO TABS: 4.0000 mg | ORAL_TABLET | Freq: Four times a day (QID) | ORAL | Status: DC | PRN

## 2012-01-23 MED ORDER — MORPHINE SULFATE 4 MG/ML IJ SOLN
4.0000 mg | Freq: Once | INTRAMUSCULAR | Status: AC
  Administered 2012-01-23: 4 mg via INTRAVENOUS
  Filled 2012-01-23: qty 1

## 2012-01-23 MED ORDER — SPIRONOLACTONE 100 MG PO TABS
200.0000 mg | ORAL_TABLET | Freq: Every day | ORAL | Status: DC
  Administered 2012-01-24 – 2012-01-26 (×3): 200 mg via ORAL
  Filled 2012-01-23 (×3): qty 2

## 2012-01-23 MED ORDER — ALPRAZOLAM 1 MG PO TABS
1.0000 mg | ORAL_TABLET | Freq: Two times a day (BID) | ORAL | Status: DC | PRN
  Administered 2012-01-23 – 2012-01-25 (×5): 1 mg via ORAL
  Filled 2012-01-23 (×5): qty 1

## 2012-01-23 MED ORDER — RIFAXIMIN 550 MG PO TABS
550.0000 mg | ORAL_TABLET | Freq: Two times a day (BID) | ORAL | Status: DC
  Administered 2012-01-23 – 2012-01-26 (×6): 550 mg via ORAL
  Filled 2012-01-23 (×7): qty 1

## 2012-01-23 MED ORDER — ONDANSETRON HCL 4 MG/2ML IJ SOLN: 4.0000 mg | Freq: Four times a day (QID) | INTRAMUSCULAR | Status: DC | PRN

## 2012-01-23 MED ORDER — LACTULOSE 10 GM/15ML PO SOLN
30.0000 g | Freq: Two times a day (BID) | ORAL | Status: DC
  Administered 2012-01-24 (×2): 30 g via ORAL
  Filled 2012-01-23 (×4): qty 45

## 2012-01-23 MED ORDER — LEVETIRACETAM 500 MG PO TABS
500.0000 mg | ORAL_TABLET | Freq: Two times a day (BID) | ORAL | Status: DC
  Administered 2012-01-23 – 2012-01-26 (×6): 500 mg via ORAL
  Filled 2012-01-23 (×7): qty 1

## 2012-01-23 MED ORDER — LACTULOSE 10 GM/15ML PO SOLN
30.0000 g | Freq: Once | ORAL | Status: DC
  Administered 2012-01-23: 30 g via ORAL
  Filled 2012-01-23: qty 45

## 2012-01-23 MED ORDER — RISPERIDONE 0.25 MG PO TABS
0.2500 mg | ORAL_TABLET | Freq: Two times a day (BID) | ORAL | Status: DC
  Administered 2012-01-23 – 2012-01-26 (×6): 0.25 mg via ORAL
  Filled 2012-01-23 (×7): qty 1

## 2012-01-23 MED ORDER — OXYCODONE HCL 5 MG PO CAPS: 5.0000 mg | ORAL_CAPSULE | ORAL | Status: DC | PRN

## 2012-01-23 MED ORDER — LEVOTHYROXINE SODIUM 75 MCG PO TABS
75.0000 ug | ORAL_TABLET | Freq: Every day | ORAL | Status: DC
  Administered 2012-01-24 – 2012-01-26 (×3): 75 ug via ORAL
  Filled 2012-01-23 (×5): qty 1

## 2012-01-23 MED ORDER — FERROUS SULFATE 325 (65 FE) MG PO TABS
325.0000 mg | ORAL_TABLET | Freq: Every day | ORAL | Status: DC
  Administered 2012-01-23 – 2012-01-25 (×3): 325 mg via ORAL
  Filled 2012-01-23 (×4): qty 1

## 2012-01-23 NOTE — ED Notes (Signed)
Paged IV team for IV start. Awaiting return call.

## 2012-01-23 NOTE — ED Notes (Signed)
RN and phlebotomy attempted blood collection multiple times. Unable to obtain PT/INR. Will inform EDPA.

## 2012-01-23 NOTE — ED Notes (Signed)
Informed EDPA of unsuccessful IV start by IV team. PA and MD to be in to attempt ultrasound guided IV start. Pt updated on plan of care. Asked about his ammonia level, informed it was high and told RN he had taken lactulose yesterday but not for a few days before that.

## 2012-01-23 NOTE — ED Notes (Signed)
Pt in from independent living facility by ems. C/o generalized weakness. Was found crawling in floor by another resident today. Denies falling. Sts he was unable to get off floor.

## 2012-01-23 NOTE — ED Notes (Signed)
[id number]<JY> Expected date:<BR> Expected time:<BR> Means of arrival:Ambulance<BR> Comments:<BR> Fall

## 2012-01-23 NOTE — H&P (Addendum)
[facility] History and Physical  Tony Boyd ZOX:[id number] DOB: 1955-03-11 DOA: 01/23/2012  Referring physician: ER physician PCP: [name], DO   Chief Complaint: confusion  HPI:  56 year old male with multiple medical co-morbidities including but not limited to alcohol abuse history and ESLD, coagulopathy, thrombocytopenia and seizure disorder who presented to ED feeling weak and confused. Patient is a fair historian. He is alert and oriented at this time and says he was not as compliant with lactulose and he then started feeling confused such as that at moments he would not know where he is. He reports no chest pain, no shortness of breath, no palpitations. No abdominal pain, no nausea or vomiting. No blood in stool or urine. No fever or chills.  Assessment and Plan:  Principal Problem:  *Hepatic encephalopathy  Secondary to non-compliance with lactulose and/or rifaximin  Ammonia  level on admission 141  Alcohol level WNL  Continue lactulose and rifaximin  Patient is hemodynamically stable so we will restart home meds which includes lasix, spironolactone  Replace electrolytes as needed  Continue protonix, folic acid, multivitamin  PT evaluation  Active Problems:  Coagulopathy  Secondary to liver cirrhosis  INR is 2.41 but there is no sign of active bleed  Follow up INR in am and if still elevated may consider vitamin K  Thrombocytopenia  Platelet count was 49 in 03/2011 and on this admission 44  No signs of active bleed  Will use SCD's for DVT prophylaxis   Hyponatremia  Sodium 128 on this admission  This is secondary to ESLD  Patient received 1 L IV fluids in ED; we will not continue IV fluids as patient able to tolerate oral intake  We will follow up am labs   Anemia of chronic disease  Hemoglobin is WNL on this admission   Code Status: Full Family Communication: Pt at bedside Disposition Plan: Admit for further evaluation;  observation status for now, med-surg floor  [name], MD  [facility] Pager [phone]  If 7PM-7AM, please contact night-coverage [email or web address] Password [email or web address] 01/23/2012, 8:00 PM   Review of Systems:  Constitutional: Negative for fever, chills and malaise/fatigue. Negative for diaphoresis.  HENT: Negative for hearing loss, ear pain, nosebleeds, congestion, sore throat, neck pain, tinnitus and ear discharge.   Eyes: Negative for blurred vision, double vision, photophobia, pain, discharge and redness.  Respiratory: Negative for cough, hemoptysis, sputum production, shortness of breath, wheezing and stridor.   Cardiovascular: Negative for chest pain, palpitations, orthopnea, claudication and leg swelling.  Gastrointestinal: Negative for nausea, vomiting and abdominal pain. Negative for heartburn, constipation, blood in stool and melena.  Genitourinary: Negative for dysuria, urgency, frequency, hematuria and flank pain.  Musculoskeletal: Negative for myalgias, back pain, joint pain and falls.  Skin: Negative for itching and rash.  Neurological: positive for confusion and fall but no weakness and no dizziness or lightheadedness, no tremors.  Endo/Heme/Allergies: Negative for environmental allergies and polydipsia. Does not bruise/bleed easily.  Psychiatric/Behavioral: Negative for suicidal ideas. The patient is not nervous/anxious.      Past Medical History  Diagnosis Date  . Cirrhosis of liver   . Hepatitis C   . ETOH abuse   . Hypothyroid   . Psychosis   . Bipolar disorder   . Seizures   . Arthritis   . Back pain   . Coagulopathy     Hx of  . Thrombocytopenia     Hx of  . Pancytopenia     Hx of  . H/O  hypokalemia   . Hyponatremia     Hx of  . Korsakoff psychosis   . H/O abdominal abscess   . H/O esophageal varices   . Metabolic encephalopathy   . Hepatic encephalopathy   . Urinary urgency   . H/O renal failure   . H/O ascites   . Altered mental status   . Anemia   .  Ascites    Past Surgical History  Procedure Date  . Colostomy   . Cholecystectomy   . Small intestine surgery   . Arm surgery     left arm  . US guided drain placement in ventral hernia abscess 07/21/2010  . Multiple picc line placements   . Esophagogastroduodenoscopy 06/28/2011    Procedure: ESOPHAGOGASTRODUODENOSCOPY (EGD);  Surgeon: Louis Meckel, MD;  Location: [facility];  Service: Endoscopy;  Laterality: N/A;   Social History:  reports that he quit smoking about 3 years ago. His smoking use included Cigarettes. He has never used smokeless tobacco. He reports that he does not drink alcohol or use illicit drugs.  Allergies  Allergen Reactions  . Droperidol Other (See Comments)    unknown  . Ketorolac Other (See Comments)    unknown  . Ketorolac Tromethamine Other (See Comments)    unknown  . Penicillins Other (See Comments)    unkown    Family History: htn in mother  Prior to Admission medications   Medication Sig Start Date End Date Taking? Authorizing Provider  ALPRAZolam Prudy Feeler) 1 MG tablet Take 1 mg by mouth 2 (two) times daily as needed. For anxiety.   Yes Historical Provider, MD  ferrous sulfate 325 (65 FE) MG tablet Take 325 mg by mouth at bedtime.   Yes Historical Provider, MD  folic acid (FOLVITE) 1 MG tablet Take 1 mg by mouth daily.   Yes Historical Provider, MD  furosemide (LASIX) 40 MG tablet Take 40 mg by mouth 2 (two) times daily.    Yes Historical Provider, MD  lactulose (CHRONULAC) 10 GM/15ML solution Take 30 g by mouth 4 (four) times daily.   Yes Historical Provider, MD  levETIRAcetam (KEPPRA) 500 MG tablet Take 500 mg by mouth 2 (two) times daily.   Yes Historical Provider, MD  levothyroxine (SYNTHROID, LEVOTHROID) 75 MCG tablet Take 75 mcg by mouth daily.   Yes Historical Provider, MD  Multiple Vitamin (MULTIVITAMIN WITH MINERALS) TABS Take 1 tablet by mouth daily.   Yes Historical Provider, MD  omeprazole (PRILOSEC) 20 MG capsule Take 20 mg by mouth  daily.   Yes Historical Provider, MD  oxycodone (OXY-IR) 5 MG capsule Take 5 mg by mouth every 4 (four) hours as needed. For pain.   Yes Historical Provider, MD  potassium chloride (K-DUR) 10 MEQ tablet Take 10 mEq by mouth daily.   Yes Historical Provider, MD  rifaximin (XIFAXAN) 550 MG TABS Take 550 mg by mouth 2 (two) times daily.    Yes Historical Provider, MD  risperiDONE (RISPERDAL) 0.25 MG tablet Take 0.25 mg by mouth 2 (two) times daily.   Yes Historical Provider, MD  spironolactone (ALDACTONE) 100 MG tablet Take 200 mg by mouth daily.    Yes Historical Provider, MD   Physical Exam: Filed Vitals:   01/23/12 1320 01/23/12 1511 01/23/12 1929  BP: 126/67 122/72 126/76  Pulse: 94 85 86  Temp: 97.8 F (36.6 C)  97.9 F (36.6 C)  TempSrc: Oral  Oral  Resp: 14 18 20   SpO2: 99% 100% 100%    Physical Exam  Constitutional: Appears well-developed and well-nourished. No distress.  HENT: Normocephalic. External right and left ear normal. Oropharynx is clear and moist.  Eyes: Conjunctivae and EOM are normal. PERRLA Neck: Normal ROM. Neck supple. No JVD. No tracheal deviation. No thyromegaly.  CVS: RRR, S1/S2 +, no murmurs, no gallops, no carotid bruit.  Pulmonary: Effort and breath sounds normal, no stridor, rhonchi, wheezes, rales.  Abdominal: Soft. BS +,  no tenderness, rebound or guarding.  Musculoskeletal: Normal range of motion. No edema and no tenderness.  Lymphadenopathy: No lymphadenopathy noted, cervical, inguinal. Neuro: Alert. Normal reflexes, muscle tone coordination. No cranial nerve deficit. Skin: Skin is warm and dry. No rash noted. Not diaphoretic. No erythema. No pallor.  Psychiatric: Normal mood and affect. Behavior, judgment, thought content normal.   Labs on Admission:  Basic Metabolic Panel:  Lab 01/23/12 1610  NA 128*  K 4.1  CL 95*  CO2 21  GLUCOSE 177*  BUN 18  CREATININE 0.81  CALCIUM 9.3   Liver Function Tests:  Lab 01/23/12 1403  AST 109*  ALT  72*  ALKPHOS 188*  BILITOT 3.7*  PROT 8.0  ALBUMIN 2.7*   No results found for this basename: LIPASE:5,AMYLASE:5 in the last 168 hours  Lab 01/23/12 1403  AMMONIA 141*   CBC:  Lab 01/23/12 1403  WBC 4.8  HGB 13.6  HCT 37.6*  MCV 100.5*  PLT 44*   Cardiac Enzymes: No results found for this basename: CKTOTAL:5,CKMB:5,CKMBINDEX:5,TROPONINI:5 in the last 168 hours BNP: No components found with this basename: POCBNP:5 CBG: No results found for this basename: GLUCAP:5 in the last 168 hours  Radiological Exams on Admission: No results found.  EKG: Normal sinus rhythm, no ST/T wave changes  Time spent: 75 minutes

## 2012-01-23 NOTE — ED Provider Notes (Signed)
History     CSN: [id number]  Arrival date & time 01/23/12  1301   First MD Initiated Contact with Patient 01/23/12 1315      Chief Complaint  Patient presents with  . Weakness    (Consider location/radiation/quality/duration/timing/severity/associated sxs/prior treatment) HPI Comments: 56 year old male with a history of Cirrhosis, hepatitis C, alcohol abuse, hypothyroidism, bipolar disorder, Korsakoff psychosis presents emergency department from independent living by EMS complaining of generalized weakness.  History is limited by patient's baseline.  Per EMS patient was found crawling on the floor by another resident when 911 was called.  Patient states that he believes that he is feeling "fine", abdominal pain, chest pain , shortness of breath, headache or change in vision.    Past Medical History  Diagnosis Date  . Cirrhosis of liver   . Hepatitis C   . ETOH abuse   . Hypothyroid   . Psychosis   . Bipolar disorder   . Seizures   . Arthritis   . Back pain   . Coagulopathy     Hx of  . Thrombocytopenia     Hx of  . Pancytopenia     Hx of  . H/O hypokalemia   . Hyponatremia     Hx of  . Korsakoff psychosis   . H/O abdominal abscess   . H/O esophageal varices   . Metabolic encephalopathy   . Hepatic encephalopathy   . Urinary urgency   . H/O renal failure   . H/O ascites   . Altered mental status   . Anemia   . Ascites     Past Surgical History  Procedure Date  . Colostomy   . Cholecystectomy   . Small intestine surgery   . Arm surgery     left arm  . US guided drain placement in ventral hernia abscess 07/21/2010  . Multiple picc line placements   . Esophagogastroduodenoscopy 06/28/2011    Procedure: ESOPHAGOGASTRODUODENOSCOPY (EGD);  Surgeon: Louis Meckel, MD;  Location: [facility];  Service: Endoscopy;  Laterality: N/A;    Family History  Problem Relation Age of Onset  . Heart disease Mother     MI  . Lung cancer Sister   . Lung cancer Brother      History  Substance Use Topics  . Smoking status: Former Smoker    Types: Cigarettes    Quit date: 02/15/2008  . Smokeless tobacco: Never Used  . Alcohol Use: No      Review of Systems  Unable to perform ROS Constitutional: Negative for fever, diaphoresis and activity change.  HENT: Negative for congestion and neck pain.   Respiratory: Negative for cough.   Genitourinary: Negative for dysuria.  Musculoskeletal: Positive for back pain. Negative for myalgias.  Skin: Negative for color change and wound.  Neurological: Positive for weakness. Negative for headaches.  All other systems reviewed and are negative.    Allergies  Droperidol; Ketorolac; Ketorolac tromethamine; and Penicillins  Home Medications   Current Outpatient Rx  Name  Route  Sig  Dispense  Refill  . ALPRAZOLAM 1 MG PO TABS   Oral   Take 1 mg by mouth 2 (two) times daily as needed. For anxiety.         Di Kindle SULFATE 325 (65 FE) MG PO TABS   Oral   Take 325 mg by mouth at bedtime.         [name] FOLIC ACID 1 MG PO TABS   Oral   Take 1 mg  by mouth daily.         . FUROSEMIDE 40 MG PO TABS   Oral   Take 40 mg by mouth 2 (two) times daily.          [name] LACTULOSE 10 GM/15ML PO SOLN   Oral   Take 30 g by mouth 4 (four) times daily.         [name] LEVETIRACETAM 500 MG PO TABS   Oral   Take 500 mg by mouth 2 (two) times daily.         [name] LEVOTHYROXINE SODIUM 75 MCG PO TABS   Oral   Take 75 mcg by mouth daily.         . ADULT MULTIVITAMIN W/MINERALS CH   Oral   Take 1 tablet by mouth daily.         [name] OMEPRAZOLE 20 MG PO CPDR   Oral   Take 20 mg by mouth daily.         . OXYCODONE HCL 5 MG PO CAPS   Oral   Take 5 mg by mouth every 4 (four) hours as needed. For pain.         [name] POTASSIUM CHLORIDE ER 10 MEQ PO TBCR   Oral   Take 10 mEq by mouth daily.         [name] RIFAXIMIN 550 MG PO TABS   Oral   Take 550 mg by mouth 2 (two) times daily.          [name] RISPERIDONE 0.25 MG PO  TABS   Oral   Take 0.25 mg by mouth 2 (two) times daily.         [name] SPIRONOLACTONE 100 MG PO TABS   Oral   Take 200 mg by mouth daily.            BP 122/72  Pulse 85  Temp 97.8 F (36.6 C) (Oral)  Resp 18  SpO2 100%  Physical Exam  Nursing note and vitals reviewed. Constitutional: He is oriented to person, place, and time. He appears well-developed and well-nourished. No distress.  HENT:  Head: Normocephalic and atraumatic.  Eyes: EOM are normal.       Scleral icterus  Neck: Normal range of motion.       No spinous process step offs or bony tenderness to palpation.  Normal flexion, extension and rotation pain-free  Cardiovascular:       RRR, no aberrancy on auscultation.  Intact distal pulses  Pulmonary/Chest: Effort normal.  Abdominal:       Surgical scar midline abdomen, mild ascites/distention present.  Nontender abdomen with normal bowel sounds  Musculoskeletal: Normal range of motion.       Mild tenderness to palpation along the lumbar region, no bony tenderness  Neurological: He is alert and oriented to person, place, and time.  Skin: Skin is warm and dry. No rash noted. He is not diaphoretic.        Angiomas across chest, jaundiced skin  Psychiatric:       Slow cognitive thought process, impaired memory . No SI/HI     ED Course  Procedures (including critical care time)  Labs Reviewed  COMPREHENSIVE METABOLIC PANEL - Abnormal; Notable for the following:    Sodium 128 (*)     Chloride 95 (*)     Glucose, Bld 177 (*)     Albumin 2.7 (*)     AST 109 (*)     ALT 72 (*)  Alkaline Phosphatase 188 (*)     Total Bilirubin 3.7 (*)     All other components within normal limits  AMMONIA - Abnormal; Notable for the following:    Ammonia 141 (*)     All other components within normal limits  CBC WITH DIFFERENTIAL - Abnormal; Notable for the following:    RBC 3.74 (*)     HCT 37.6 (*)     MCV 100.5 (*)     MCH 36.4 (*)     MCHC 36.2 (*)     Platelets 44  (*)     Neutrophils Relative 80 (*)     Lymphocytes Relative 10 (*)     Lymphs Abs 0.5 (*)     All other components within normal limits  URINALYSIS, ROUTINE W REFLEX MICROSCOPIC - Abnormal; Notable for the following:    Color, Urine AMBER (*)  BIOCHEMICALS MAY BE AFFECTED BY COLOR   APPearance CLOUDY (*)     Bilirubin Urine SMALL (*)     All other components within normal limits  TYPE AND SCREEN  ETHANOL  PROTIME-INR  APTT   No results found.   No diagnosis found.  4:05 PM IV team failed access, will attempt US guided. Coags pending.   5:10 PM re-page [facility]  MDM  Hepatic encephalopathy  56yo M w hx of Hep C, liver cirrhosis presented to ER w generalized weakness. Question pts mental baseline vs new cognitive change. Labs and imaging reviewed, elevated ammonia- 30g lactulose given. The patient appears reasonably stabilized for admission considering the current resources, flow, and capabilities available in the ED at this time, and I doubt any other [facility] requiring further screening and/or treatment in the ED prior to admission.            Jaci Carrel, [place] 01/23/12 1746

## 2012-01-24 LAB — COMPREHENSIVE METABOLIC PANEL
ALT: 69 U/L — ABNORMAL HIGH (ref 0–53)
AST: 117 U/L — ABNORMAL HIGH (ref 0–37)
Albumin: 2.5 g/dL — ABNORMAL LOW (ref 3.5–5.2)
Alkaline Phosphatase: 168 U/L — ABNORMAL HIGH (ref 39–117)
BUN: 14 mg/dL (ref 6–23)
CO2: 24 mEq/L (ref 19–32)
Calcium: 8.7 mg/dL (ref 8.4–10.5)
Chloride: 94 mEq/L — ABNORMAL LOW (ref 96–112)
Creatinine, Ser: 0.77 mg/dL (ref 0.50–1.35)
GFR calc Af Amer: >90 mL/min (ref >90)
GFR calc non Af Amer: >90 mL/min (ref >90)
Glucose, Bld: 191 mg/dL — ABNORMAL HIGH (ref 70–99)
Potassium: 3.9 mEq/L (ref 3.5–5.1)
Sodium: 127 mEq/L — ABNORMAL LOW (ref 135–145)
Total Bilirubin: 4 mg/dL — ABNORMAL HIGH (ref 0.3–1.2)
Total Protein: 7.1 g/dL (ref 6.0–8.3)

## 2012-01-24 LAB — CBC
HCT: 36.9 % — ABNORMAL LOW (ref 39.0–52.0)
Hemoglobin: 13.2 g/dL (ref 13.0–17.0)
MCH: 36.3 pg — ABNORMAL HIGH (ref 26.0–34.0)
MCHC: 35.8 g/dL (ref 30.0–36.0)
MCV: 101.4 fL — ABNORMAL HIGH (ref 78.0–100.0)
Platelets: 42 10*3/uL — ABNORMAL LOW (ref 150–400)
RBC: 3.64 MIL/uL — ABNORMAL LOW (ref 4.22–5.81)
RDW: 15 % (ref 11.5–15.5)
WBC: 4 10*3/uL (ref 4.0–10.5)

## 2012-01-24 LAB — MRSA PCR SCREENING: MRSA by PCR: NEGATIVE

## 2012-01-24 LAB — GLUCOSE, CAPILLARY: Glucose-Capillary: 193 mg/dL — ABNORMAL HIGH (ref 70–99)

## 2012-01-24 LAB — PROTIME-INR
INR: 1.59 — ABNORMAL HIGH (ref 0.00–1.49)
Prothrombin Time: 18.5 seconds — ABNORMAL HIGH (ref 11.6–15.2)

## 2012-01-24 MED ORDER — SODIUM CHLORIDE 0.9 % IV SOLN
INTRAVENOUS | Status: DC
  Administered 2012-01-24 – 2012-01-25 (×2): via INTRAVENOUS

## 2012-01-24 NOTE — Progress Notes (Signed)
[facility] PROGRESS NOTE  Tony Boyd QMV:[id number] DOB: [birth year] DOA: 01/23/2012 PCP: [name], DO  Brief narrative: 56 year old male with multiple medical co-morbidities including but not limited to alcohol abuse history and ESLD, coagulopathy, thrombocytopenia and seizure disorder who presented to ED feeling weak and confused. Patient was found to have ammonia level 144 on admission which is slowly trending down with noticeable improvement in mental status.  Assessment and Plan:   Principal Problem:  *Hepatic encephalopathy  Secondary to non-compliance with lactulose and/or rifaximin  Ammonia level today 94, slowly trending down Alcohol level was WNL  We will continue lactulose and rifaximin  Continue protonix, folic acid, multivitamin  PT evaluation - follow up   Active Problems:  Coagulopathy  Secondary to liver cirrhosis  INR was 2.41 on admission Follow up this morning INR and if still high will give Vitamin K 1 dose Thrombocytopenia  Platelet count was 49 in 03/2011 and on this admission 44  No signs of active bleed  Platelet count slightly down since admission Hyponatremia  Sodium 128 on this admission  This is likely secondary to ESLD and low sodium diet required for ESLD  Will start normal saline today and see if sodium goes up Anemia of chronic disease  Hemoglobin is WNL on this admission  Code Status: Full  Family Communication: no family at bedside Disposition Plan: needs PT/OT evaluation  [name], MD  [facility]  Pager [phone]   If 7PM-7AM, please contact night-coverage [email or web address] Password [email or web address] 01/24/2012, 12:32 PM   LOS: 1 day   Consultants:  PT/OT evaluation  Procedures:  None   Antibiotics:  None   HPI/Subjective: No acute overnight events.  Objective: Filed Vitals:   01/23/12 2140 01/23/12 2205 01/24/12 0519 01/24/12 0652  BP: 119/64 136/60  138/71  Pulse: 81 80  87  Temp: 98.3 F (36.8 C) 97.9 F (36.6 C)   98 F (36.7 C)  TempSrc: Oral Oral  Oral  Resp:  16  18  Height:  5\' 7"  (1.702 m)    Weight:  115 kg (253 lb 8.5 oz) 115.5 kg (254 lb 10.1 oz)   SpO2: 100% 100%  100%    Intake/Output Summary (Last 24 hours) at 01/24/12 1232 Last data filed at 01/24/12 1045  Gross per 24 hour  Intake      0 ml  Output   1825 ml  Net  -1825 ml    Exam:   General:  Pt is alert, follows commands appropriately, not in acute distress  Cardiovascular: Regular rate and rhythm, S1/S2, no murmurs, no rubs, no gallops  Respiratory: Clear to auscultation bilaterally, no wheezing, no crackles, no rhonchi  Abdomen: Soft, non tender, non distended, bowel sounds present, no guarding  Extremities: No edema, pulses DP and PT palpable bilaterally  Neuro: Grossly nonfocal  Data Reviewed: Basic Metabolic Panel:  Lab 01/24/12 4010 01/23/12 1403  NA 127* 128*  K 3.9 4.1  CL 94* 95*  CO2 24 21  GLUCOSE 191* 177*  BUN 14 18  CREATININE 0.77 0.81  CALCIUM 8.7 9.3  MG -- --  PHOS -- --   Liver Function Tests:  Lab 01/24/12 0438 01/23/12 1403  AST 117* 109*  ALT 69* 72*  ALKPHOS 168* 188*  BILITOT 4.0* 3.7*  PROT 7.1 8.0  ALBUMIN 2.5* 2.7*   No results found for this basename: LIPASE:5,AMYLASE:5 in the last 168 hours  Lab 01/24/12 0438 01/23/12 1403  AMMONIA 94* 141*   CBC:  Lab  01/24/12 0438 01/23/12 1403  WBC 4.0 4.8  HGB 13.2 13.6  HCT 36.9* 37.6*  MCV 101.4* 100.5*  PLT 42* 44*   Cardiac Enzymes: No results found for this basename: CKTOTAL:5,CKMB:5,CKMBINDEX:5,TROPONINI:5 in the last 168 hours BNP: No components found with this basename: POCBNP:5 CBG:  Lab 01/24/12 0646  GLUCAP 193*    Recent Results (from the past 240 hour(s))  MRSA PCR SCREENING     Status: Normal   Collection Time   01/24/12  5:34 AM      Component Value Range Status Comment   MRSA by PCR NEGATIVE  NEGATIVE Final      Studies: No results found.  Scheduled Meds:  . ferrous sulfate  325 mg Oral  QHS  . folic acid  1 mg Oral Daily  . furosemide  40 mg Oral BID  . lactulose  30 g Oral BID  . levETIRAcetam  500 mg Oral BID  . levothyroxine  75 mcg Oral QAC breakfast  . multivitamin   1 tablet Oral Daily  . pantoprazole  40 mg Oral Daily  . potassium chloride  10 mEq Oral Daily  . rifaximin  550 mg Oral BID  . risperiDONE  0.25 mg Oral BID  . spironolactone  200 mg Oral Daily   Continuous Infusions:   . sodium chloride

## 2012-01-25 LAB — GLUCOSE, CAPILLARY: Glucose-Capillary: 133 mg/dL — ABNORMAL HIGH (ref 70–99)

## 2012-01-25 LAB — AMMONIA: Ammonia: 201 umol/L — ABNORMAL HIGH (ref 11–60)

## 2012-01-25 MED ORDER — LACTULOSE 10 GM/15ML PO SOLN
30.0000 g | Freq: Four times a day (QID) | ORAL | Status: DC
  Administered 2012-01-25 – 2012-01-26 (×5): 30 g via ORAL
  Filled 2012-01-25 (×7): qty 45

## 2012-01-25 MED ORDER — LACTULOSE 10 GM/15ML PO SOLN
30.0000 g | Freq: Three times a day (TID) | ORAL | Status: DC
  Administered 2012-01-25: 30 g via ORAL
  Filled 2012-01-25 (×3): qty 45

## 2012-01-25 NOTE — Evaluation (Signed)
Physical Therapy Evaluation Patient Details Name: Tony Boyd MRN: [id number] DOB: [birth year] Today's Date: 01/25/2012 Time: 4098-1191 PT Time Calculation (min): 24 min  PT Assessment / Plan / Recommendation Clinical Impression  56 yo male with multiple medical problems admitted with hepatic encephalopathy. Pt has had extensive physical therapy in the past and, at baseline, is ambulatory with a rollator.  He states he has lost a wheel off his rollator and he is having more back and hip pain since he has had to adjust his gait pattern to compensate for the missing wheel.  Feel he will be able to return to previous mobility level once his metabolic issues are resolved and that he does not need HHPT.  He does need repair or replacement of rollator. Will follow while he is an inpatient and recommend nursing continue to ambulate with him ad lib.    PT Assessment  Patient needs continued PT services    Follow Up Recommendations  No PT follow up    Does the patient have the potential to tolerate intense rehabilitation      Barriers to Discharge        Equipment Recommendations  Other (comment) (wheel or replacement of rollator)    Recommendations for Other Services     Frequency Min 3X/week    Precautions / Restrictions Precautions Precautions: Fall   Pertinent Vitals/Pain Pt c/o chronic hip and back pain with activity     Mobility  Bed Mobility Bed Mobility: Sit to Supine Sit to Supine: 7: Independent Details for Bed Mobility Assistance: some difficulty due to large abdomen, but patient able to move OK Transfers Transfers: Sit to Stand;Stand to Sit Sit to Stand: 5: Supervision Stand to Sit: 5: Supervision Details for Transfer Assistance: attempted multiple times to instruct patient to always use arms to reach back and push up with for safety, but he did not consistently follow commands Ambulation/Gait Ambulation/Gait Assistance: 4: Min assist Ambulation Distance (Feet): 200  Feet Assistive device: Rolling walker Ambulation/Gait Assistance Details: superivison for safety and minor assist to direct RW Gait Pattern: Decreased trunk rotation;Step-through pattern Gait velocity: decreased General Gait Details: Pt kept head and eyes staring  straight ahead.  He was able to turn head from side to side while walking, but only minimal amount. He said he had no difficulty with his vision Stairs: No Wheelchair Mobility Wheelchair Mobility: No    Shoulder Instructions     Exercises Other Exercises Other Exercises: pt had difficulty following commands for repetitive exercise session   PT Diagnosis: Difficulty walking;Generalized weakness  PT Problem List: Decreased activity tolerance;Decreased mobility PT Treatment Interventions: DME instruction;Therapeutic activities;Therapeutic exercise;Patient/family education   PT Goals Acute Rehab PT Goals PT Goal Formulation: With patient Time For Goal Achievement: 02/01/12 Potential to Achieve Goals: Good Pt will Ambulate: >150 feet;with modified independence;with least restrictive assistive device PT Goal: Ambulate - Progress: Goal set today  Visit Information  Last PT Received On: 01/25/12 Assistance Needed: +1    Subjective Data  Subjective: "I lost a wheel on my walker" Patient Stated Goal: to get RW repaired or replaced   Prior Functioning  Home Living Lives With: Alone Available Help at Discharge: Family Type of Home: Apartment Home Access: Elevator Home Layout: One level Prior Function Level of Independence: Independent with assistive device(s) Communication Communication: No difficulties    Cognition  Overall Cognitive Status: Appears within functional limits for tasks assessed/performed Arousal/Alertness: Awake/alert Orientation Level: Appears intact for tasks assessed Behavior During Session: [facility] for tasks performed  Cognition - Other Comments: pt had some difficulty following commands for structured  activities  but was able to perform automatic tasks without difficulty.     Extremity/Trunk Assessment Right Lower Extremity Assessment RLE ROM/Strength/Tone: WFL for tasks assessed Left Lower Extremity Assessment LLE ROM/Strength/Tone: WFL for tasks assessed Trunk Assessment Trunk Assessment: Other exceptions Trunk Exceptions: large anterior abdomen   Balance Balance Balance Assessed: Yes Static Sitting Balance Static Sitting - Balance Support: No upper extremity supported;Feet supported Static Sitting - Level of Assistance: 7: Independent Static Standing Balance Static Standing - Balance Support: Bilateral upper extremity supported Static Standing - Level of Assistance: 6: Modified independent (Device/Increase time) Static Standing - Comment/# of Minutes: pt uses a RW at baseline  End of Session PT - End of Session Equipment Utilized During Treatment: Gait belt Activity Tolerance: Patient tolerated treatment well Patient left: in chair;with call bell/phone within reach;with nursing in room Nurse Communication: Mobility status  GP Functional Assessment Tool Used: clinical judgement Functional Limitation: Mobility: Walking and moving around Mobility: Walking and Moving Around Current Status [phone]): At least 1 percent but less than 20 percent impaired, limited or restricted Mobility: Walking and Moving Around Goal Status [phone]): At least 1 percent but less than 20 percent impaired, limited or restricted   Donnetta Hail 01/25/2012, 10:48 AM

## 2012-01-25 NOTE — Evaluation (Signed)
Occupational Therapy Evaluation Patient Details Name: Tony Boyd MRN: [id number] DOB: January 03, 1956 Today's Date: 01/25/2012 Time: 4098-1191 OT Time Calculation (min): 35 min  OT Assessment / Plan / Recommendation Clinical Impression  56 yo male with multiple medical problems admitted with hepatic encephalopathy. Feel pt is likely close to baseline. Skilled OT recommended to maximize ADL independence and safety for d/c home.    OT Assessment  Patient needs continued OT Services    Follow Up Recommendations   (based on progress.)    Barriers to Discharge      Equipment Recommendations  3 in 1 bedside comode;Other (comment) ( wheel or replacement of rollator) )    Recommendations for Other Services    Frequency  Min 2X/week    Precautions / Restrictions Precautions Precautions: Fall   Pertinent Vitals/Pain Denied pain    ADL  Grooming: Performed;Wash/dry hands;Supervision/safety Where Assessed - Grooming: Supported standing Upper Body Bathing: Simulated;Set up Where Assessed - Upper Body Bathing: Unsupported sitting Lower Body Bathing: Simulated;Minimal assistance Where Assessed - Lower Body Bathing: Supported sit to stand Upper Body Dressing: Simulated;Set up Where Assessed - Upper Body Dressing: Unsupported sitting Lower Body Dressing: Simulated;Moderate assistance Where Assessed - Lower Body Dressing: Supported sit to stand Toilet Transfer: Research scientist (life sciences) Method: Sit to Barista: Regular height toilet;Grab bars Toileting - Architect and Hygiene: Performed;Minimal assistance Where Assessed - Engineer, mining and Hygiene: Sit to stand from 3-in-1 or toilet Equipment Used: Rolling walker Transfers/Ambulation Related to ADLs: Pt ambulated to the bathroom with supervision and RW. ADL Comments: Pt reports max difficulty with socks and had difficulty reaching around to throughly clean back peri  area after BM.    OT Diagnosis: Generalized weakness  OT Problem List: Decreased activity tolerance;Decreased safety awareness;Decreased knowledge of use of DME or AE OT Treatment Interventions: Self-care/ADL training;Therapeutic activities;DME and/or AE instruction;Patient/family education   OT Goals Acute Rehab OT Goals OT Goal Formulation: With patient Time For Goal Achievement: 02/08/12 Potential to Achieve Goals: Good ADL Goals Pt Will Perform Grooming: Standing at sink;with modified independence ADL Goal: Grooming - Progress: Goal set today Pt Will Perform Lower Body Bathing: with supervision;Sit to stand from chair;Sit to stand from bed ADL Goal: Lower Body Bathing - Progress: Goal set today Pt Will Perform Lower Body Dressing: with supervision;Sit to stand from bed;Sit to stand from chair;with adaptive equipment ADL Goal: Lower Body Dressing - Progress: Goal set today Pt Will Transfer to Toilet: with modified independence;Regular height toilet;Ambulation;with DME ADL Goal: Toilet Transfer - Progress: Goal set today Pt Will Perform Toileting - Clothing Manipulation: with modified independence;Sitting on 3-in-1 or toilet;Standing ADL Goal: Toileting - Clothing Manipulation - Progress: Goal set today Pt Will Perform Toileting - Hygiene: with modified independence;Sit to stand from 3-in-1/toilet ADL Goal: Toileting - Hygiene - Progress: Goal set today Pt Will Perform Tub/Shower Transfer: Shower transfer;with supervision;Ambulation ADL Goal: Tub/Shower Transfer - Progress: Goal set today  Visit Information  Last OT Received On: 01/25/12 Assistance Needed: +1    Subjective Data  Subjective: My daughter helps me as much as she can... Patient Stated Goal: Return home tomorrow.   Prior Functioning     Home Living Lives With: Alone Available Help at Discharge: Family;Available PRN/intermittently Type of Home: Apartment Home Access: Elevator Home Layout: One level Bathroom  Shower/Tub: Health visitor: Standard Home Adaptive Equipment: None Additional Comments: Pt has 4 ww but it is missing a wheel. Prior Function Level of Independence: Independent with assistive device(s)  Driving: No Vocation: On disability Communication Communication: No difficulties         Vision/Perception     Cognition  Overall Cognitive Status: Appears within functional limits for tasks assessed/performed Arousal/Alertness: Awake/alert Orientation Level: Appears intact for tasks assessed Behavior During Session: [facility] for tasks performed Cognition - Other Comments: Pt slow to process at times.    Extremity/Trunk Assessment Right Upper Extremity Assessment RUE ROM/Strength/Tone: WFL for tasks assessed Left Upper Extremity Assessment LUE ROM/Strength/Tone: WFL for tasks assessed     Mobility Bed Mobility Bed Mobility: Supine to Sit Supine to Sit: 6: Modified independent (Device/Increase time);HOB elevated;With rails Sit to Supine: 6: Modified independent (Device/Increase time);With rail;HOB flat Transfers Sit to Stand: 5: Supervision;From bed;From toilet;With upper extremity assist Stand to Sit: 5: Supervision;With upper extremity assist;To bed;To toilet Details for Transfer Assistance: attempted multiple times to instruct patient to always use arms to reach back and push up with for safety, but he did not consistently follow commands.     Shoulder Instructions     Exercise     Balance Static Sitting Balance Static Sitting - Balance Support: No upper extremity supported;Feet supported Static Sitting - Level of Assistance: 7: Independent Static Standing Balance Static Standing - Balance Support: No upper extremity supported;During functional activity Static Standing - Level of Assistance: 5: Stand by assistance Static Standing - Comment/# of Minutes: to wash hands at sink. Pt was able to reach outside BOS to use soap dispenser.   End of Session OT -  End of Session Activity Tolerance: Patient tolerated treatment well Patient left: in bed;with call bell/phone within reach  GO Functional Assessment Tool Used: Clinical Judgement Functional Limitation: Self care Self Care Current Status (Z6109): At least 20 percent but less than 40 percent impaired, limited or restricted Self Care Goal Status (U0454): At least 1 percent but less than 20 percent impaired, limited or restricted   Audi Wettstein A OTR/L [phone] 01/25/2012, 3:52 PM

## 2012-01-25 NOTE — Progress Notes (Signed)
Clinical Social Work Department BRIEF PSYCHOSOCIAL ASSESSMENT 01/25/2012  Patient:  Tony Boyd, Tony Boyd     Account Number:  [id number]     Admit date:  01/23/2012  Clinical Social Worker:  Jacelyn Grip  Date/Time:  01/25/2012 10:45 AM  Referred by:  Physician  Date Referred:  01/25/2012 Referred for  ALF Placement   Other Referral:   Interview type:  Patient Other interview type:    PSYCHOSOCIAL DATA Living Status:  FACILITY Admitted from facility:   Level of care:  Independent Living Primary support name:  [name] Charles/daughter Primary support relationship to patient:  CHILD, ADULT Degree of support available:   adequate    CURRENT CONCERNS Current Concerns  Other - See comment   Other Concerns:   Pt admitted from ILF    SOCIAL WORK ASSESSMENT / PLAN CSW received notification that pt from [employer]. CSW spoke with pt at bedside who confirmed and plans to return. Pt is interested in any [facility] services that he may be appropriate for. CSW will notify RNCM of potential home health needs. Pt reports strong support from pt daughter who checks in on him daily. Pt reports pt daughter can provide transportation for pt at discharge back to ILF. No further social work needs identified at this time. CSW signing off.   Assessment/plan status:  No Further Intervention Required Other assessment/ plan:   Information/referral to community resources:   Referral to [facility]    PATIENT'S/FAMILY'S RESPONSE TO PLAN OF CARE: Pt alert and oriented. Pt appears to be content in ILF apartment, but is hopeful for home health services to provide some assistance at home if pt appropriate.

## 2012-01-25 NOTE — Progress Notes (Signed)
CARE MANAGEMENT NOTE 01/25/2012  Patient:  Tony Boyd, Tony Boyd   Account Number:  [id number]  Date Initiated:  01/25/2012  Documentation initiated by:  PEARSON,[name]  Subjective/Objective Assessment:   pt admitted with cco confusion     Action/Plan:   from indep living   Anticipated DC Date:  01/26/2012   Anticipated DC Plan:  HOME/SELF CARE      DC Planning Services  CM consult      Choice offered to / List presented to:             Status of service:  In process, will continue to follow Medicare Important Message given?  NA - LOS <3 / Initial given by admissions (If response is "NO", the following Medicare IM given date fields will be blank) Date Medicare IM given:   Date Additional Medicare IM given:    Discharge Disposition:    Per UR Regulation:  Reviewed for med. necessity/level of care/duration of stay  If discussed at Long Length of Stay Meetings, dates discussed:    Comments:  01/25/12 MPearson, RN, BSN Spoke with pt concerning discharge needs. Pt states that he has no needs at present time. Pt do not want HHRN. States he daughter will take him home at discharge.

## 2012-01-25 NOTE — Progress Notes (Addendum)
[facility] PROGRESS NOTE  Kyrel Leighton YQM:[id number] DOB: 10/03/1955 DOA: 01/23/2012 PCP: [name], DO  Brief narrative: 56 year old male with multiple medical co-morbidities including but not limited to alcohol abuse history and ESLD, coagulopathy, thrombocytopenia and seizure disorder who presented to ED feeling weak and confused. Patient was found to have ammonia level 144 on admission which initially trended down but now up again at 200. We have increased lactulose to 4 times a day  Assessment and Plan:   Principal Problem:  *Hepatic encephalopathy  Secondary to non-compliance with lactulose and/or rifaximin  Ammonia level 200 today Will increase lactulose to QID to increase BM and hopefully lower the ammonia level Mental status is at baseline, oriented and alert Alcohol level was WNL on admission We will continue rifaximin current doasge Continue protonix, folic acid, multivitamin  PT evaluation - follow up   Active Problems:  Coagulopathy  Secondary to liver cirrhosis  INR down to 1.5  No signs of active bleed Thrombocytopenia  Platelet count was 49 in 03/2011 and on this admission 44  No signs of active bleed  Continue to monitor CBC Hyponatremia  This is likely secondary to ESLD and low sodium diet required for ESLD  Continue normal saline Follow up BMP in am Anemia of chronic disease  Hemoglobin is WNL on this admission  Code Status: Full  Family Communication: no family at bedside  Disposition Plan: home in next 24 hours if ammonia level starts to trend down  [name], MD  [facility]  Pager [phone]    If 7PM-7AM, please contact night-coverage [email or web address] Password [facility] 01/25/2012, 8:53 AM   LOS: 2 days   HPI/Subjective: No acute events overnight.  Objective: Filed Vitals:   01/24/12 0652 01/24/12 1447 01/24/12 2040 01/25/12 0535  BP: 138/71 130/63 121/71 111/60  Pulse: 87 83 93 89  Temp: 98 F (36.7 C) 97.9 F (36.6 C) 97.5 F (36.4  C) 98 F (36.7 C)  TempSrc: Oral Oral Oral Oral  Resp: 18 18 18 20   Height:      Weight:    121.9 kg (268 lb 11.9 oz)  SpO2: 100% 100% 100% 99%    Intake/Output Summary (Last 24 hours) at 01/25/12 0853 Last data filed at 01/25/12 0600  Gross per 24 hour  Intake   1230 ml  Output   2350 ml  Net  -1120 ml    Exam:   General:  Pt is alert, follows commands appropriately, not in acute distress  Cardiovascular: Regular rate and rhythm, S1/S2, no murmurs, no rubs, no gallops  Respiratory: Clear to auscultation bilaterally, no wheezing, no crackles, no rhonchi  Abdomen: Soft, non tender, non distended, bowel sounds present, no guarding  Extremities: No edema, pulses DP and PT palpable bilaterally  Neuro: Grossly nonfocal  Data Reviewed: Basic Metabolic Panel:  Lab 01/24/12 4401 01/23/12 1403  NA 127* 128*  K 3.9 4.1  CL 94* 95*  CO2 24 21  GLUCOSE 191* 177*  BUN 14 18  CREATININE 0.77 0.81  CALCIUM 8.7 9.3   Liver Function Tests:  Lab 01/24/12 0438 01/23/12 1403  AST 117* 109*  ALT 69* 72*  ALKPHOS 168* 188*  BILITOT 4.0* 3.7*  PROT 7.1 8.0  ALBUMIN 2.5* 2.7*    Lab 01/25/12 0402 01/24/12 0438 01/23/12 1403  AMMONIA 201* 94* 141*   CBC:  Lab 01/24/12 0438 01/23/12 1403  WBC 4.0 4.8  HGB 13.2 13.6  HCT 36.9* 37.6*  MCV 101.4* 100.5*  PLT 42*  44*   CBG:  Lab 01/25/12 0753 01/24/12 0646  GLUCAP 133* 193*    MRSA PCR SCREENING     Status: Normal   Collection Time   01/24/12  5:34 AM      Component Value Range Status Comment   MRSA by PCR NEGATIVE  NEGATIVE Final      Studies: No results found.  Scheduled Meds:  . ferrous sulfate  325 mg Oral QHS  . folic acid  1 mg Oral Daily  . furosemide  40 mg Oral BID  . lactulose  30 g Oral TID  . levETIRAcetam  500 mg Oral BID  . levothyroxine  75 mcg Oral QAC breakfast  . multivitamin   1 tablet Oral Daily  . pantoprazole  40 mg Oral Daily  . potassium chloride  10 mEq Oral Daily  . rifaximin   550 mg Oral BID  . risperiDONE  0.25 mg Oral BID  . spironolactone  200 mg Oral Daily   Continuous Infusions:   . sodium chloride 50 mL/hr at 01/24/12 1500

## 2012-01-25 NOTE — ED Provider Notes (Signed)
Medical screening examination/treatment/procedure(s) were performed by non-physician practitioner and as supervising physician I was immediately available for consultation/collaboration.  Mabry Santarelli K Linker, MD 01/25/12 0702 

## 2012-01-26 LAB — AMMONIA: Ammonia: 74 umol/L — ABNORMAL HIGH (ref 11–60)

## 2012-01-26 LAB — BASIC METABOLIC PANEL
BUN: 11 mg/dL (ref 6–23)
Calcium: 8 mg/dL — ABNORMAL LOW (ref 8.4–10.5)
Chloride: 92 mEq/L — ABNORMAL LOW (ref 96–112)
Creatinine, Ser: 0.73 mg/dL (ref 0.50–1.35)
GFR calc Af Amer: >90 mL/min (ref >90)
GFR calc non Af Amer: >90 mL/min (ref >90)

## 2012-01-26 LAB — GLUCOSE, CAPILLARY: Glucose-Capillary: 155 mg/dL — ABNORMAL HIGH (ref 70–99)

## 2012-01-26 MED ORDER — RIFAXIMIN 550 MG PO TABS: 550.0000 mg | ORAL_TABLET | Freq: Two times a day (BID) | ORAL | Status: DC

## 2012-01-26 MED ORDER — ALPRAZOLAM 1 MG PO TABS: 1.0000 mg | ORAL_TABLET | Freq: Two times a day (BID) | ORAL | Status: DC | PRN

## 2012-01-26 MED ORDER — OXYCODONE HCL 5 MG PO CAPS: 5.0000 mg | ORAL_CAPSULE | ORAL | Status: DC | PRN

## 2012-01-26 MED ORDER — LACTULOSE 10 GM/15ML PO SOLN: 30.0000 g | Freq: Four times a day (QID) | ORAL | Status: DC

## 2012-01-26 MED ORDER — RISPERIDONE 0.25 MG PO TABS: 0.2500 mg | ORAL_TABLET | Freq: Two times a day (BID) | ORAL | Status: DC

## 2012-01-26 NOTE — Progress Notes (Signed)
   CARE MANAGEMENT NOTE 01/26/2012  Patient:  Tony Boyd, Tony Boyd   Account Number:  [id number]  Date Initiated:  01/25/2012  Documentation initiated by:  PEARSON,[name]  Subjective/Objective Assessment:   pt admitted with cco confusion     Action/Plan:   from indep living   Anticipated DC Date:  01/26/2012   Anticipated DC Plan:  HOME/SELF CARE      DC Planning Services  CM consult      [facility] Choice  HOME HEALTH   Choice offered to / List presented to:  [name]        HH arranged  HH-1 RN  HH-2 PT  HH-4 NURSE'S AIDE  HH-3 OT  HH-6 SOCIAL WORKER      HH agency  [facility]   Status of service:  In process, will continue to follow Medicare Important Message given?  NA - LOS <3 / Initial given by admissions (If response is "NO", the following Medicare IM given date fields will be blank) Date Medicare IM given:   Date Additional Medicare IM given:    Discharge Disposition:    Per UR Regulation:  Reviewed for med. necessity/level of care/duration of stay  If discussed at Long Length of Stay Meetings, dates discussed:    Comments:  01/26/2012  1200 Darlyne Russian RN, [place]  [phone] CM referral: RN, PT, OT, aide, SW, 3:1  Met with patient regarding home health referral. He selected [facility] for home health services.  [facility]/ called with referral, faxed order and demographics. [facility]/ called for 3:1  01/25/12 MPearson, RN, BSN Spoke with pt concerning discharge needs. Pt states that he has no needs at present time. Pt do not want HHRN. States he daughter will take him home at discharge.

## 2012-01-26 NOTE — Progress Notes (Signed)
1523 01/26/12 Reviewed discharged instructions with patient and daughter Tony Boyd). Daughter will try to move his PCP appt up from the end of December to within two weeks of discharge. Also she will call for orthopedic appt. Copy of referral, discharge papers and prescriptions given to daughter. Patient and daughter both verbalized understanding of discharge instructions

## 2012-01-26 NOTE — Progress Notes (Signed)
Dr Elisabeth Pigeon notified concerning pts CBC clotting x 2 this AM, was instructed not to repeat lab, pt is being discharged.

## 2012-01-26 NOTE — Progress Notes (Signed)
Occupational Therapy Treatment Patient Details Name: Tony Boyd MRN: [id number] DOB: 1955/04/27 Today's Date: 01/26/2012 Time: 0272-5366 OT Time Calculation (min): 42 min  OT Assessment / Plan / Recommendation Comments on Treatment Session Pt saturated in urine when I arrived.  Pt lives alone.  Concerns for home alone.     Follow Up Recommendations  Supervision/Assistance - 24 hour;Home health OT (? STSNF if pt qualifies if no 24/7.  ) PT did not recommend follow up in their initial eval.  HHOT would need PT or Nursing to open case.  If none of these are possible, recommend at least a home safety eval--but I feel he needs much more than this    Barriers to Discharge       Equipment Recommendations  3 in 1 bedside comode;Other (comment) new rollator or replacement wheel as one is missing   Recommendations for Other Services    Frequency Min 2X/week   Plan      Precautions / Restrictions Precautions Precautions: Fall Restrictions Weight Bearing Restrictions: No   Pertinent Vitals/Pain No pain    ADL  Upper Body Bathing: Performed;Set up Where Assessed - Upper Body Bathing: Unsupported sitting Lower Body Bathing: Performed;Minimal assistance Where Assessed - Lower Body Bathing: Supported sit to stand Upper Body Dressing: Performed;Minimal assistance (to tie gown) Where Assessed - Upper Body Dressing: Unsupported sitting Lower Body Dressing: Performed;Moderate assistance Where Assessed - Lower Body Dressing: Supported sit to stand Toilet Transfer: Research scientist (life sciences) Method: Sit to Barista: Comfort height toilet;Grab bars Toileting - Architect and Hygiene: Performed;Set up Where Assessed - Toileting Clothing Manipulation and Hygiene: Sit to stand from 3-in-1 or toilet Tub/Shower Transfer: Performed;Min guard Tub/Shower Transfer Method: Science writer: Walk in  shower Transfers/Ambulation Related to ADLs: supervision walking into bathroom.  independent with bed mobility and min guard for safety to step into shower stall ADL Comments: Pt was saturated in urine when I arrived.  NT had already started bathing with pt.  Concerns for safety at home, given urine.  Pt is in an independent apt and daughter gets groceries.  Text paged MD but have not heard back yet.  Educated pt on AE but he is not interested in this.      OT Diagnosis:    OT Problem List:   OT Treatment Interventions:     OT Goals ADL Goals Pt Will Perform Lower Body Bathing: with supervision;Sit to stand from chair;Sit to stand from bed ADL Goal: Lower Body Bathing - Progress: Progressing toward goals Pt Will Perform Lower Body Dressing: with supervision;Sit to stand from bed;Sit to stand from chair;with adaptive equipment ADL Goal: Lower Body Dressing - Progress: Progressing toward goals Pt Will Transfer to Toilet: with modified independence;Regular height toilet;Ambulation;with DME ADL Goal: Toilet Transfer - Progress: Progressing toward goals Pt Will Perform Toileting - Hygiene: with modified independence;Sit to stand from 3-in-1/toilet ADL Goal: Toileting - Hygiene - Progress: Met Pt Will Perform Tub/Shower Transfer: Shower transfer;with supervision;Ambulation ADL Goal: Tub/Shower Transfer - Progress: Progressing toward goals  Visit Information  Last OT Received On: 01/26/12 Assistance Needed: +1    Subjective Data      Prior Functioning       Cognition  Overall Cognitive Status: Impaired Area of Impairment: Safety/judgement Arousal/Alertness: Awake/alert Orientation Level: Appears intact for tasks assessed Behavior During Session: [facility] for tasks performed Cognition - Other Comments: pt states he has pulled grab bars from wall ? towel racks.  Educated on AE but he is  mostly set in his ways    Mobility  Shoulder Instructions Bed Mobility Supine to Sit: 7:  Independent Transfers Sit to Stand: 5: Supervision;From bed;From toilet;From chair/3-in-1       Exercises      Balance     End of Session OT - End of Session Activity Tolerance: Patient tolerated treatment well Patient left: in bed;with call bell/phone within reach Nurse Communication: Other (comment) (concerns about d/c)  GO     Emberlee Sortino 01/26/2012, 11:45 AM Marica Otter, OTR/L [phone] 01/26/2012

## 2012-01-26 NOTE — Discharge Summary (Addendum)
Physician Discharge Summary  Tony Boyd BJS:[id number] DOB: Mar 20, 1955 DOA: 01/23/2012  PCP: Tony Spikes, DO  Admit date: 01/23/2012 Discharge date: 01/26/2012  Recommendations for Outpatient Follow-up:  1. Referral made for orthopedics for back and hip pain.  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  Diet recommendation: low sodium diet  History of present illness:  56 year old male with multiple medical co-morbidities including but not limited to alcohol abuse history and ESLD, coagulopathy, thrombocytopenia and seizure disorder who presented to ED feeling weak and confused. Patient was found to have ammonia level 144 on admission which initially trended down but now up again at 200. We have increased lactulose to 4 times a day and ammonia today 74.  Assessment and Plan:   Principal Problem:  *Hepatic encephalopathy  Secondary to non-compliance with lactulose and/or rifaximin  Ammonia level 74 today  Continue lactulose per home regimen Mental status is at baseline, oriented and alert  Alcohol level was WNL on admission  Continue rifaximin current doasge  Continue protonix, folic acid, multivitamin   Active Problems:  Coagulopathy  Secondary to liver cirrhosis  INR down to 1.5  No signs of active bleed Thrombocytopenia    Platelet count was 49 in 03/2011 and on this admission 44  No signs of active bleed  Hyponatremia  This is likely secondary to ESLD and low sodium diet required for ESLD  In past sodium as low as 121-125  Patient is mentally at baseline, oriented to time, place and person Anemia of chronic disease  Hemoglobin is WNL on this admission Protein calorie malnutrition  Secondary to liver cirrhosis  Code Status: Full  Family Communication: no family at bedside  Disposition Plan: home today  Tony Passey, MD  [facility]  Pager [phone]   Discharge Exam: Filed Vitals:   01/26/12 0500  BP: 110/64  Pulse: 79  Temp: 97.1 F (36.2 C)  Resp: 18   Filed Vitals:   01/25/12 0535 01/25/12 1353 01/25/12 2100 01/26/12 0500  BP: 111/60 123/80 108/58 110/64  Pulse: 89 100 79 79  Temp: 98 F (36.7 C) 97.4 F (36.3 C) 97.5 F (36.4 C) 97.1 F (36.2 C)  TempSrc: Oral Oral Oral Oral  Resp: 20 18 20 18   Height:      Weight: 121.9 kg (268 lb 11.9 oz)   124.4 kg (274 lb 4 oz)  SpO2: 99% 100% 100% 100%    General: Pt is alert, follows commands appropriately, not in acute distress Cardiovascular: Regular rate and rhythm, S1/S2 +, no murmurs, no rubs, no gallops Respiratory: Clear to auscultation bilaterally, no wheezing, no crackles, no rhonchi Abdominal: Soft, non tender, non distended, bowel sounds +, no guarding Extremities: no edema, no cyanosis, pulses palpable bilaterally DP and PT Neuro: Grossly nonfocal  Discharge Instructions  Discharge Orders    Future Orders  Please Complete By Expires   Diet - low sodium heart healthy      Increase activity slowly      Call MD for:  persistant nausea and vomiting      Call MD for:  severe uncontrolled pain      Call MD for:  difficulty breathing, headache or visual disturbances      Call MD for:  persistant dizziness or light-headedness          Medication List     As of 01/26/2012  7:17 AM    TAKE these medications         ALPRAZolam 1  MG tablet   Commonly known as: XANAX   Take 1 tablet (1 mg total) by mouth 2 (two) times daily as needed for sleep or anxiety. For anxiety.      ferrous sulfate 325 (65 FE) MG tablet   Take 325 mg by mouth at bedtime.      folic acid 1 MG tablet   Commonly known as: FOLVITE   Take 1 mg by mouth daily.      furosemide 40 MG tablet   Commonly known as: LASIX   Take 40 mg by mouth 2 (two) times daily.      lactulose 10 GM/15ML solution   Commonly known as: CHRONULAC   Take 45 mLs (30 g total) by mouth 4 (four) times daily.      levETIRAcetam 500 MG tablet   Commonly known as: KEPPRA   Take 500 mg by mouth 2 (two) times daily.      levothyroxine 75 MCG tablet   Commonly known as: SYNTHROID, LEVOTHROID   Take 75 mcg by mouth daily.      multivitamin with minerals Tabs   Take 1 tablet by mouth daily.      omeprazole 20 MG capsule   Commonly known as: PRILOSEC   Take 20 mg by mouth daily.      oxycodone 5 MG capsule   Commonly known as: OXY-IR   Take 1 capsule (5 mg total) by mouth every 4 (four) hours as needed for pain. For pain.      potassium chloride 10 MEQ tablet   Commonly known as: K-DUR   Take 10 mEq by mouth daily.      rifaximin 550 MG Tabs   Commonly known as: XIFAXAN   Take 1 tablet (550 mg total) by mouth 2 (two) times daily.      risperiDONE 0.25 MG tablet   Commonly known as: RISPERDAL   Take 1 tablet (0.25 mg total) by mouth 2 (two) times daily.      spironolactone 100 MG tablet   Commonly known as: ALDACTONE   Take 200 mg by mouth daily.           Follow-up Information    Follow up with REED, TIFFANY, DO. In 2 weeks. (If symptoms worsen)    Contact information:   [address] [place] [address] [phone]           The results of significant diagnostics from this hospitalization (including imaging, microbiology, ancillary and laboratory) are listed below for reference.    Significant Diagnostic Studies: No results  found.  Microbiology: Recent Results (from the past 240 hour(s))  MRSA PCR SCREENING     Status: Normal   Collection Time   01/24/12  5:34 AM      Component Value Range Status Comment   MRSA by PCR NEGATIVE  NEGATIVE Final  Labs: Basic Metabolic Panel:  Lab 01/26/12 1610 01/24/12 0438 01/23/12 1403  NA 125* 127* 128*  K 4.1 3.9 4.1  CL 92* 94* 95*  CO2 27 24 21   GLUCOSE 143* 191* 177*  BUN 11 14 18   CREATININE 0.73 0.77 0.81  CALCIUM 8.0* 8.7 9.3   Liver Function Tests:  Lab 01/24/12 0438 01/23/12 1403  AST 117* 109*  ALT 69* 72*  ALKPHOS 168* 188*  BILITOT 4.0* 3.7*  PROT 7.1 8.0  ALBUMIN 2.5* 2.7*   No results found for this basename: LIPASE:5,AMYLASE:5 in the last 168 hours  Lab 01/26/12 0515 01/25/12 0402 01/24/12 0438 01/23/12 1403  AMMONIA 74* 201* 94* 141*   CBC:  Lab 01/24/12 0438 01/23/12 1403  WBC 4.0 4.8  HGB 13.2 13.6  HCT 36.9* 37.6*  MCV 101.4* 100.5*  PLT 42* 44*   Cardiac Enzymes: No results found for this basename: CKTOTAL:5,CKMB:5,CKMBINDEX:5,TROPONINI:5 in the last 168 hours BNP: BNP (last 3 results) No results found for this basename: PROBNP:3 in the last 8760 hours CBG:  Lab 01/25/12 0753 01/24/12 0646  GLUCAP 133* 193*    Time coordinating discharge: Over 30 minutes  Signed:  [name], MD  [facility] 01/26/2012, 7:17 AM  Pager #: [phone]

## 2012-04-14 ENCOUNTER — Encounter: Payer: Self-pay | Admitting: Gastroenterology

## 2012-04-14 ENCOUNTER — Ambulatory Visit (INDEPENDENT_AMBULATORY_CARE_PROVIDER_SITE_OTHER): Payer: Medicare Other | Admitting: Gastroenterology

## 2012-04-14 VITALS — BP 106/70 | HR 80 | Ht 66.0 in | Wt 268.0 lb

## 2012-04-14 DIAGNOSIS — K7460 Unspecified cirrhosis of liver: Secondary | ICD-10-CM

## 2012-04-14 DIAGNOSIS — I8500 Esophageal varices without bleeding: Secondary | ICD-10-CM

## 2012-04-14 DIAGNOSIS — R188 Other ascites: Secondary | ICD-10-CM | POA: Insufficient documentation

## 2012-04-14 MED ORDER — NADOLOL 40 MG PO TABS: 40.0000 mg | ORAL_TABLET | Freq: Every day | ORAL | Status: DC

## 2012-04-14 MED ORDER — SPIRONOLACTONE 100 MG PO TABS: 200.0000 mg | ORAL_TABLET | Freq: Two times a day (BID) | ORAL | Status: DC

## 2012-04-14 NOTE — Assessment & Plan Note (Addendum)
Although nadolol was prescribed after his endoscopy he doesn't appear to be taking it. On the basis of his slow decompensation I suspect that his varices may have worsened and that he is at increased risk for variceal bleeding. He has bled in the past.  Recommendations #1 repeat endoscopy #2 begin nadolol 40 mg daily

## 2012-04-14 NOTE — Patient Instructions (Addendum)
You have been scheduled for an Endoscopy You will go to the basement today for labs You will need to schedule a follow up appointment in 3 weeks Before your appointment you will need to have a BMET drawn Increase your Aldactone to 100mg  twice a day

## 2012-04-14 NOTE — Assessment & Plan Note (Signed)
He has slowly worsening ascites despite diuretics.  Recommendations #1 increase Aldactone to 100 mg twice a day we'll continue Lasix 40 mg twice a day #2 low salt diet #3 follow weights

## 2012-04-14 NOTE — Progress Notes (Signed)
History of Present Illness: Tony Boyd has returned for followup of cirrhosis. He is slowly gaining weight and has noticed an increased abdominal girth. This is despite increasing Lasix to 40 mg twice a day while continuing on Aldactone. He has esophageal varices when last examined in April, 2013. He no longer drinks. His daughter states that he has variable states of confusion. He is on a low-salt diet and continues on xifixan and lactulose.    Past Medical History  Diagnosis Date  . Cirrhosis of liver   . Hepatitis C   . ETOH abuse   . Hypothyroid   . Psychosis   . Bipolar disorder   . Seizures   . Arthritis   . Back pain   . Coagulopathy     Hx of  . Thrombocytopenia     Hx of  . Pancytopenia     Hx of  . H/O hypokalemia   . Hyponatremia     Hx of  . Korsakoff psychosis   . H/O abdominal abscess   . H/O esophageal varices   . Metabolic encephalopathy   . Hepatic encephalopathy   . Urinary urgency   . H/O renal failure   . H/O ascites   . Altered mental status   . Anemia   . Ascites    Past Surgical History  Procedure Laterality Date  . Colostomy    . Cholecystectomy    . Small intestine surgery    . Arm surgery      left arm  . US guided drain placement in ventral hernia abscess  07/21/2010  . Multiple picc line placements    . Esophagogastroduodenoscopy  06/28/2011    Procedure: ESOPHAGOGASTRODUODENOSCOPY (EGD);  Surgeon: Louis Meckel, MD;  Location: [facility];  Service: Endoscopy;  Laterality: N/A;   family history includes Heart disease in his mother and Lung cancer in his brother and sister. Current Outpatient Prescriptions  Medication Sig Dispense Refill  . ALPRAZolam (XANAX) 1 MG tablet Take 1 tablet (1 mg total) by mouth 2 (two) times daily as needed for sleep or anxiety. For anxiety.  30 tablet  0  . ferrous sulfate 325 (65 FE) MG tablet Take 325 mg by mouth at bedtime.      . folic acid (FOLVITE) 1 MG tablet Take 1 mg by mouth daily.      .  furosemide (LASIX) 40 MG tablet Take 40 mg by mouth 2 (two) times daily.       [name] lactulose (CHRONULAC) 10 GM/15ML solution Take 45 mLs (30 g total) by mouth 4 (four) times daily.  240 mL  3  . levETIRAcetam (KEPPRA) 500 MG tablet Take 500 mg by mouth 2 (two) times daily.      [name] levothyroxine (SYNTHROID, LEVOTHROID) 75 MCG tablet Take 75 mcg by mouth daily.      . Multiple Vitamin (MULTIVITAMIN WITH MINERALS) TABS Take 1 tablet by mouth daily.      [name] omeprazole (PRILOSEC) 20 MG capsule Take 20 mg by mouth daily.      [name] oxycodone (OXY-IR) 5 MG capsule Take 1 capsule (5 mg total) by mouth every 4 (four) hours as needed for pain. For pain.  60 capsule  0  . potassium chloride (K-DUR) 10 MEQ tablet Take 10 mEq by mouth daily.      . rifaximin (XIFAXAN) 550 MG TABS Take 1 tablet (550 mg total) by mouth 2 (two) times daily.  60 tablet  0  . risperiDONE (RISPERDAL) 0.25  MG tablet Take 1 tablet (0.25 mg total) by mouth 2 (two) times daily.  60 tablet  0  . spironolactone (ALDACTONE) 100 MG tablet Take 200 mg by mouth daily.        No current facility-administered medications for this visit.   Allergies as of 04/14/2012 - Review Complete 04/14/2012  Allergen Reaction Noted  . Droperidol Other (See Comments) 09/14/2010  . Ketorolac Other (See Comments) 09/14/2010  . Ketorolac tromethamine Other (See Comments) 05/08/2011  . Penicillins Other (See Comments) 09/14/2010    reports that he quit smoking about 4 years ago. His smoking use included Cigarettes. He smoked 0.00 packs per day. He has never used smokeless tobacco. He reports that he does not drink alcohol or use illicit drugs.     Review of Systems: He has severe low back pain Pertinent positive and negative review of systems were noted in the above HPI section. All other review of systems were otherwise negative.  Vital signs were reviewed in today's medical record Physical Exam: General: Chronically ill-appearing in no acute distress Skin:  anicteric Head: Normocephalic and atraumatic Eyes:  sclerae anicteric, EOMI Ears: Normal auditory acuity Mouth: No deformity or lesions Neck: Supple, no masses or thyromegaly Lungs: Clear throughout to auscultation Heart: Regular rate and rhythm; no murmurs, rubs or bruits Abdomen: Protuberant, non tender.  There is a prominent venous pattern and obvious ascites. No masses, hepatosplenomegaly or hernias noted. Normal Bowel sounds Rectal:deferred Musculoskeletal: Symmetrical with no gross deformities  Skin: No lesions on visible extremities Pulses:  Normal pulses noted Extremities: No clubbing  or deformities noted. There are chronic venous stasis changes and 2+ ankle edema Neurological: Alert oriented x 4, grossly nonfocal. Asterixis is present Cervical Nodes:  No significant cervical adenopathy Inguinal Nodes: No significant inguinal adenopathy Psychological:  Alert and cooperative. Normal mood and affect

## 2012-04-14 NOTE — Assessment & Plan Note (Addendum)
Overall patient appears to have slow decompensation of liver function as determined by worsening ascites and mild encephalopathy.  Recommendations #1 continue xifaxan and lactulose  #2 adequate nutrition was stressed to the patient including the need for snacks #3 check serologies for hepatitis A and B. and vaccinate accordingly

## 2012-04-16 ENCOUNTER — Emergency Department (HOSPITAL_COMMUNITY): Payer: Medicare Other

## 2012-04-16 ENCOUNTER — Inpatient Hospital Stay (HOSPITAL_COMMUNITY)
Admission: EM | Admit: 2012-04-16 | Discharge: 2012-04-19 | DRG: 442 | Disposition: A | Discharge status: Home / Self Care | Payer: Medicare Other | Attending: Internal Medicine | Admitting: Internal Medicine

## 2012-04-16 DIAGNOSIS — E871 Hypo-osmolality and hyponatremia: Secondary | ICD-10-CM | POA: Diagnosis present

## 2012-04-16 DIAGNOSIS — R188 Other ascites: Secondary | ICD-10-CM

## 2012-04-16 DIAGNOSIS — D689 Coagulation defect, unspecified: Secondary | ICD-10-CM | POA: Diagnosis present

## 2012-04-16 DIAGNOSIS — F1010 Alcohol abuse, uncomplicated: Secondary | ICD-10-CM | POA: Diagnosis present

## 2012-04-16 DIAGNOSIS — R410 Disorientation, unspecified: Secondary | ICD-10-CM

## 2012-04-16 DIAGNOSIS — B1921 Unspecified viral hepatitis C with hepatic coma: Principal | ICD-10-CM | POA: Diagnosis present

## 2012-04-16 DIAGNOSIS — K7290 Hepatic failure, unspecified without coma: Secondary | ICD-10-CM | POA: Diagnosis present

## 2012-04-16 DIAGNOSIS — E039 Hypothyroidism, unspecified: Secondary | ICD-10-CM | POA: Diagnosis present

## 2012-04-16 DIAGNOSIS — K7200 Acute and subacute hepatic failure without coma: Secondary | ICD-10-CM

## 2012-04-16 DIAGNOSIS — Z87891 Personal history of nicotine dependence: Secondary | ICD-10-CM

## 2012-04-16 DIAGNOSIS — I8500 Esophageal varices without bleeding: Secondary | ICD-10-CM

## 2012-04-16 DIAGNOSIS — F10229 Alcohol dependence with intoxication, unspecified: Secondary | ICD-10-CM | POA: Diagnosis present

## 2012-04-16 DIAGNOSIS — K7030 Alcoholic cirrhosis of liver without ascites: Secondary | ICD-10-CM | POA: Diagnosis present

## 2012-04-16 DIAGNOSIS — D649 Anemia, unspecified: Secondary | ICD-10-CM

## 2012-04-16 DIAGNOSIS — B1920 Unspecified viral hepatitis C without hepatic coma: Secondary | ICD-10-CM | POA: Diagnosis present

## 2012-04-16 DIAGNOSIS — F319 Bipolar disorder, unspecified: Secondary | ICD-10-CM | POA: Diagnosis present

## 2012-04-16 DIAGNOSIS — F1021 Alcohol dependence, in remission: Secondary | ICD-10-CM | POA: Diagnosis present

## 2012-04-16 DIAGNOSIS — K7460 Unspecified cirrhosis of liver: Secondary | ICD-10-CM | POA: Diagnosis present

## 2012-04-16 LAB — URINALYSIS, ROUTINE W REFLEX MICROSCOPIC
Bilirubin Urine: NEGATIVE
Ketones, ur: NEGATIVE mg/dL
Nitrite: NEGATIVE
Urobilinogen, UA: 1 mg/dL (ref 0.0–1.0)
pH: 6 (ref 5.0–8.0)

## 2012-04-16 LAB — AMMONIA: Ammonia: 80 umol/L — ABNORMAL HIGH (ref 11–60)

## 2012-04-16 LAB — CBC WITH DIFFERENTIAL/PLATELET
Basophils Absolute: 0 10*3/uL (ref 0.0–0.1)
HCT: 33.6 % — ABNORMAL LOW (ref 39.0–52.0)
Lymphocytes Relative: 19 % (ref 12–46)
Monocytes Absolute: 0.6 10*3/uL (ref 0.1–1.0)
Neutro Abs: 3.3 10*3/uL (ref 1.7–7.7)
Platelets: 56 10*3/uL — ABNORMAL LOW (ref 150–400)
RDW: 14.6 % (ref 11.5–15.5)
WBC: 5 10*3/uL (ref 4.0–10.5)

## 2012-04-16 MED ORDER — SODIUM CHLORIDE 0.9 % IV SOLN: INTRAVENOUS | Status: DC

## 2012-04-16 NOTE — ED Provider Notes (Signed)
History     CSN: [id number]  Arrival date & time 04/16/12  2056   First MD Initiated Contact with Patient 04/16/12 2121      Chief Complaint  Patient presents with  . Altered Mental Status    (Consider location/radiation/quality/duration/timing/severity/associated sxs/prior treatment) Patient is a 57 y.o. male presenting with altered mental status. The history is provided by the patient. The history is limited by the condition of the patient.  Altered Mental Status   patient here with altered mental status from home. He does have a history of alcoholic cirrhosis as well as hepatic encephalopathy. Notes that tonight he has severe back pain and rolled off his bed. Denies any loss of consciousness but does note for confusion. No recent fever or chills. Has noticed an increase in his abdominal girth and some dyspnea. Denies any anginal type chest pain. History of similar symptoms associated with his hepatic encephalopathy  Past Medical History  Diagnosis Date  . Cirrhosis of liver   . Hepatitis C   . ETOH abuse   . Hypothyroid   . Psychosis   . Bipolar disorder   . Seizures   . Arthritis   . Back pain   . Coagulopathy     Hx of  . Thrombocytopenia     Hx of  . Pancytopenia     Hx of  . H/O hypokalemia   . Hyponatremia     Hx of  . Korsakoff psychosis   . H/O abdominal abscess   . H/O esophageal varices   . Metabolic encephalopathy   . Hepatic encephalopathy   . Urinary urgency   . H/O renal failure   . H/O ascites   . Altered mental status   . Anemia   . Ascites     Past Surgical History  Procedure Laterality Date  . Colostomy    . Cholecystectomy    . Small intestine surgery    . Arm surgery      left arm  . US guided drain placement in ventral hernia abscess  07/21/2010  . Multiple picc line placements    . Esophagogastroduodenoscopy  06/28/2011    Procedure: ESOPHAGOGASTRODUODENOSCOPY (EGD);  Surgeon: Louis Meckel, MD;  Location: [facility];  Service:  Endoscopy;  Laterality: N/A;    Family History  Problem Relation Age of Onset  . Heart disease Mother     MI  . Lung cancer Sister   . Lung cancer Brother     History  Substance Use Topics  . Smoking status: Former Smoker    Types: Cigarettes    Quit date: 02/15/2008  . Smokeless tobacco: Never Used  . Alcohol Use: No      Review of Systems  Unable to perform ROS Psychiatric/Behavioral: Positive for altered mental status.    Allergies  Droperidol; Ketorolac; Ketorolac tromethamine; and Penicillins  Home Medications   Current Outpatient Rx  Name  Route  Sig  Dispense  Refill  . ALPRAZolam (XANAX) 1 MG tablet   Oral   Take 1 tablet (1 mg total) by mouth 2 (two) times daily as needed for sleep or anxiety. For anxiety.   30 tablet   0   . ferrous sulfate 325 (65 FE) MG tablet   Oral   Take 325 mg by mouth at bedtime.         . folic acid (FOLVITE) 1 MG tablet   Oral   Take 1 mg by mouth daily.         [name]  furosemide (LASIX) 40 MG tablet   Oral   Take 40 mg by mouth 2 (two) times daily.          [name] lactulose (CHRONULAC) 10 GM/15ML solution   Oral   Take 30 g by mouth 5 (five) times daily.         [name] levETIRAcetam (KEPPRA) 500 MG tablet   Oral   Take 500 mg by mouth 2 (two) times daily.         [name] levothyroxine (SYNTHROID, LEVOTHROID) 75 MCG tablet   Oral   Take 75 mcg by mouth daily.         . Multiple Vitamin (MULTIVITAMIN WITH MINERALS) TABS   Oral   Take 1 tablet by mouth daily.         [name] omeprazole (PRILOSEC) 20 MG capsule   Oral   Take 20 mg by mouth daily.         [name] oxyCODONE (OXY IR/ROXICODONE) 5 MG immediate release tablet   Oral   Take 10 mg by mouth every 4 (four) hours as needed for pain.         . potassium chloride (K-DUR,KLOR-CON) 10 MEQ tablet   Oral   Take 10 mEq by mouth daily.         . rifaximin (XIFAXAN) 550 MG TABS   Oral   Take 1 tablet (550 mg total) by mouth 2 (two) times daily.   60 tablet   0   .  risperiDONE (RISPERDAL) 0.25 MG tablet   Oral   Take 1 tablet (0.25 mg total) by mouth 2 (two) times daily.   60 tablet   0   . spironolactone (ALDACTONE) 100 MG tablet   Oral   Take 200 mg by mouth daily.         . nadolol (CORGARD) 40 MG tablet   Oral   Take 1 tablet (40 mg total) by mouth daily.   30 tablet   5     BP 130/63  Pulse 86  Temp(Src) 97.9 F (36.6 C) (Oral)  Resp 17  SpO2 99%  Physical Exam  Nursing note and vitals reviewed. Constitutional: He appears well-developed and well-nourished. He appears lethargic.  Non-toxic appearance. No distress.  HENT:  Head: Normocephalic and atraumatic.  Eyes: Conjunctivae, EOM and lids are normal. Pupils are equal, round, and reactive to light. Scleral icterus is present.  Neck: Normal range of motion. Neck supple. No tracheal deviation present. No mass present.  Cardiovascular: Normal rate, regular rhythm and normal heart sounds.  Exam reveals no gallop.   No murmur heard. Pulmonary/Chest: Effort normal and breath sounds normal. No stridor. No respiratory distress. He has no decreased breath sounds. He has no wheezes. He has no rhonchi. He has no rales.  Abdominal: Soft. Normal appearance and bowel sounds are normal. He exhibits distension and ascites. There is no tenderness. There is no rebound and no CVA tenderness.  Musculoskeletal: Normal range of motion. He exhibits no edema and no tenderness.  Bilateral edema  Neurological: He has normal strength. He appears lethargic. No cranial nerve deficit or sensory deficit. GCS eye subscore is 4. GCS verbal subscore is 5. GCS motor subscore is 6.  Skin: Skin is warm and dry. No abrasion and no rash noted.  Psychiatric: His affect is blunt. His speech is delayed. He is slowed.    ED Course  Procedures (including critical care time)  Labs Reviewed  URINE CULTURE  AMMONIA  URINALYSIS, ROUTINE W REFLEX  MICROSCOPIC  CBC WITH DIFFERENTIAL  COMPREHENSIVE METABOLIC PANEL   PROTIME-INR  APTT  ETHANOL   No results found.   No diagnosis found.    MDM  pts labs pending, dr. Dierdre Highman to dispo         Toy Baker, MD 04/16/12 [phone]

## 2012-04-16 NOTE — ED Notes (Signed)
[id number]> Expected date:<BR> Expected time:<BR> Means of arrival:<BR> Comments:<BR> EMS-hx liver cancer-altered LOC

## 2012-04-17 ENCOUNTER — Encounter (HOSPITAL_COMMUNITY): Payer: Self-pay | Admitting: Internal Medicine

## 2012-04-17 DIAGNOSIS — R410 Disorientation, unspecified: Secondary | ICD-10-CM | POA: Diagnosis present

## 2012-04-17 DIAGNOSIS — F1021 Alcohol dependence, in remission: Secondary | ICD-10-CM | POA: Diagnosis present

## 2012-04-17 DIAGNOSIS — K7290 Hepatic failure, unspecified without coma: Secondary | ICD-10-CM

## 2012-04-17 DIAGNOSIS — K7460 Unspecified cirrhosis of liver: Secondary | ICD-10-CM

## 2012-04-17 DIAGNOSIS — E871 Hypo-osmolality and hyponatremia: Secondary | ICD-10-CM

## 2012-04-17 LAB — COMPREHENSIVE METABOLIC PANEL
ALT: 49 U/L (ref 0–53)
AST: 97 U/L — ABNORMAL HIGH (ref 0–37)
Alkaline Phosphatase: 220 U/L — ABNORMAL HIGH (ref 39–117)
CO2: 18 mEq/L — ABNORMAL LOW (ref 19–32)
Chloride: 89 mEq/L — ABNORMAL LOW (ref 96–112)
GFR calc non Af Amer: >90 mL/min (ref >90)
Potassium: 4.5 mEq/L (ref 3.5–5.1)
Sodium: 120 mEq/L — ABNORMAL LOW (ref 135–145)
Total Bilirubin: 4.4 mg/dL — ABNORMAL HIGH (ref 0.3–1.2)

## 2012-04-17 LAB — BASIC METABOLIC PANEL
BUN: 7 mg/dL (ref 6–23)
CO2: 21 mEq/L (ref 19–32)
Calcium: 8 mg/dL — ABNORMAL LOW (ref 8.4–10.5)
Creatinine, Ser: 0.61 mg/dL (ref 0.50–1.35)
Glucose, Bld: 159 mg/dL — ABNORMAL HIGH (ref 70–99)

## 2012-04-17 LAB — TSH: TSH: 1.403 u[IU]/mL (ref 0.350–4.500)

## 2012-04-17 MED ORDER — FUROSEMIDE 10 MG/ML IJ SOLN
40.0000 mg | Freq: Every day | INTRAMUSCULAR | Status: DC
  Administered 2012-04-17 – 2012-04-18 (×2): 40 mg via INTRAVENOUS
  Filled 2012-04-17 (×2): qty 4

## 2012-04-17 MED ORDER — DEXTROSE-NACL 5-0.9 % IV SOLN
INTRAVENOUS | Status: DC
  Administered 2012-04-17 – 2012-04-18 (×2): via INTRAVENOUS

## 2012-04-17 MED ORDER — RISPERIDONE 0.25 MG PO TABS
0.2500 mg | ORAL_TABLET | Freq: Two times a day (BID) | ORAL | Status: DC
  Administered 2012-04-17 – 2012-04-19 (×5): 0.25 mg via ORAL
  Filled 2012-04-17 (×7): qty 1

## 2012-04-17 MED ORDER — LACTULOSE 10 GM/15ML PO SOLN
30.0000 g | Freq: Every day | ORAL | Status: DC
  Administered 2012-04-17 – 2012-04-19 (×12): 30 g via ORAL
  Filled 2012-04-17 (×16): qty 45

## 2012-04-17 MED ORDER — RIFAXIMIN 550 MG PO TABS
550.0000 mg | ORAL_TABLET | Freq: Two times a day (BID) | ORAL | Status: DC
  Administered 2012-04-17 – 2012-04-19 (×5): 550 mg via ORAL
  Filled 2012-04-17 (×6): qty 1

## 2012-04-17 MED ORDER — NADOLOL 40 MG PO TABS
40.0000 mg | ORAL_TABLET | Freq: Every day | ORAL | Status: DC
Start: 2012-04-17 — End: 2012-04-19
  Administered 2012-04-17 – 2012-04-19 (×3): 40 mg via ORAL
  Filled 2012-04-17 (×3): qty 1

## 2012-04-17 MED ORDER — PANTOPRAZOLE SODIUM 40 MG PO TBEC
40.0000 mg | DELAYED_RELEASE_TABLET | Freq: Every day | ORAL | Status: DC
  Administered 2012-04-17 – 2012-04-19 (×3): 40 mg via ORAL
  Filled 2012-04-17 (×3): qty 1

## 2012-04-17 MED ORDER — ONDANSETRON HCL 4 MG PO TABS: 4.0000 mg | ORAL_TABLET | Freq: Four times a day (QID) | ORAL | Status: DC | PRN

## 2012-04-17 MED ORDER — FOLIC ACID 1 MG PO TABS: 1.0000 mg | ORAL_TABLET | Freq: Every day | ORAL | Status: DC

## 2012-04-17 MED ORDER — ADULT MULTIVITAMIN W/MINERALS CH: 1.0000 | ORAL_TABLET | Freq: Every day | ORAL | Status: DC

## 2012-04-17 MED ORDER — FOLIC ACID 1 MG PO TABS
1.0000 mg | ORAL_TABLET | Freq: Every day | ORAL | Status: DC
  Administered 2012-04-17 – 2012-04-19 (×3): 1 mg via ORAL
  Filled 2012-04-17 (×3): qty 1

## 2012-04-17 MED ORDER — LORAZEPAM 2 MG/ML IJ SOLN: 0.0000 mg | Freq: Two times a day (BID) | INTRAMUSCULAR | Status: DC

## 2012-04-17 MED ORDER — POTASSIUM CHLORIDE CRYS ER 10 MEQ PO TBCR
10.0000 meq | EXTENDED_RELEASE_TABLET | Freq: Every day | ORAL | Status: DC
  Administered 2012-04-17 – 2012-04-19 (×3): 10 meq via ORAL
  Filled 2012-04-17 (×3): qty 1

## 2012-04-17 MED ORDER — SODIUM CHLORIDE 0.9 % IJ SOLN
10.0000 mL | INTRAMUSCULAR | Status: DC | PRN
  Administered 2012-04-18: 10 mL
  Administered 2012-04-19: 20 mL

## 2012-04-17 MED ORDER — LEVETIRACETAM 500 MG PO TABS
500.0000 mg | ORAL_TABLET | Freq: Two times a day (BID) | ORAL | Status: DC
  Administered 2012-04-17 – 2012-04-19 (×5): 500 mg via ORAL
  Filled 2012-04-17 (×6): qty 1

## 2012-04-17 MED ORDER — ONDANSETRON HCL 4 MG/2ML IJ SOLN: 4.0000 mg | Freq: Four times a day (QID) | INTRAMUSCULAR | Status: DC | PRN

## 2012-04-17 MED ORDER — FERROUS SULFATE 325 (65 FE) MG PO TABS
325.0000 mg | ORAL_TABLET | Freq: Every day | ORAL | Status: DC
  Administered 2012-04-17 – 2012-04-18 (×2): 325 mg via ORAL
  Filled 2012-04-17 (×3): qty 1

## 2012-04-17 MED ORDER — LEVOTHYROXINE SODIUM 75 MCG PO TABS
75.0000 ug | ORAL_TABLET | Freq: Every day | ORAL | Status: DC
  Administered 2012-04-17 – 2012-04-19 (×3): 75 ug via ORAL
  Filled 2012-04-17 (×4): qty 1

## 2012-04-17 MED ORDER — THIAMINE HCL 100 MG/ML IJ SOLN
100.0000 mg | Freq: Every day | INTRAMUSCULAR | Status: DC
  Filled 2012-04-17 (×3): qty 1

## 2012-04-17 MED ORDER — LORAZEPAM 1 MG PO TABS
1.0000 mg | ORAL_TABLET | Freq: Four times a day (QID) | ORAL | Status: DC | PRN
  Administered 2012-04-18: 1 mg via ORAL
  Filled 2012-04-17 (×2): qty 1

## 2012-04-17 MED ORDER — ADULT MULTIVITAMIN W/MINERALS CH
1.0000 | ORAL_TABLET | Freq: Every day | ORAL | Status: DC
  Administered 2012-04-17 – 2012-04-19 (×3): 1 via ORAL
  Filled 2012-04-17 (×3): qty 1

## 2012-04-17 MED ORDER — ALPRAZOLAM 1 MG PO TABS
1.0000 mg | ORAL_TABLET | Freq: Two times a day (BID) | ORAL | Status: DC | PRN
  Administered 2012-04-18: 1 mg via ORAL
  Filled 2012-04-17: qty 1

## 2012-04-17 MED ORDER — OXYCODONE HCL 5 MG PO TABS
10.0000 mg | ORAL_TABLET | ORAL | Status: DC | PRN
  Administered 2012-04-17 – 2012-04-19 (×9): 10 mg via ORAL
  Filled 2012-04-17 (×10): qty 2

## 2012-04-17 MED ORDER — SPIRONOLACTONE 100 MG PO TABS
200.0000 mg | ORAL_TABLET | Freq: Every day | ORAL | Status: DC
  Administered 2012-04-17 – 2012-04-19 (×3): 200 mg via ORAL
  Filled 2012-04-17 (×3): qty 2

## 2012-04-17 MED ORDER — VITAMIN B-1 100 MG PO TABS
100.0000 mg | ORAL_TABLET | Freq: Every day | ORAL | Status: DC
  Administered 2012-04-17 – 2012-04-19 (×3): 100 mg via ORAL
  Filled 2012-04-17 (×3): qty 1

## 2012-04-17 MED ORDER — LORAZEPAM 2 MG/ML IJ SOLN: 0.0000 mg | Freq: Four times a day (QID) | INTRAMUSCULAR | Status: AC

## 2012-04-17 MED ORDER — LORAZEPAM 2 MG/ML IJ SOLN: 1.0000 mg | Freq: Four times a day (QID) | INTRAMUSCULAR | Status: DC | PRN

## 2012-04-17 NOTE — Progress Notes (Signed)
Peripherally Inserted Central Catheter/Midline Placement  The IV Nurse has discussed with the patient and/or persons authorized to consent for the patient, the purpose of this procedure and the potential benefits and risks involved with this procedure.  The benefits include less needle sticks, lab draws from the catheter and patient may be discharged home with the catheter.  Risks include, but not limited to, infection, bleeding, blood clot (thrombus formation), and puncture of an artery; nerve damage and irregular heat beat.  Alternatives to this procedure were also discussed.  PICC/Midline Placement Documentation        Tony Boyd 04/17/2012, 8:29 AM

## 2012-04-17 NOTE — Progress Notes (Signed)
UR completed 

## 2012-04-17 NOTE — Progress Notes (Signed)
Clinical Social Work Department BRIEF PSYCHOSOCIAL ASSESSMENT 04/17/2012  Patient:  Tony Boyd, Tony Boyd     Account Number:  [id number]     Admit date:  04/16/2012  Clinical Social Worker:  Doroteo Glassman  Date/Time:  04/17/2012 10:04 AM  Referred by:  Physician  Date Referred:  04/17/2012 Referred for  Substance Abuse   Other Referral:   Interview type:  Patient Other interview type:    PSYCHOSOCIAL DATA Living Status:  FACILITY Admitted from facility:   Level of care:  Independent Living Primary support name:  [name] Primary support relationship to patient:  CHILD, ADULT Degree of support available:   adequate    CURRENT CONCERNS Current Concerns  Substance Abuse   Other Concerns:    SOCIAL WORK ASSESSMENT / PLAN Met with Pt to discuss current SA.    Pt reported that he had been sober for 4 years until 1 year ago.  He stated that he lives at the [facility] in the Independent Living section.  Pt reported that he walks approx 10 miles per day for "something to do" and that his route consists of going by his "[place] friend's house," who makes his own spiked apple cider.  Pt stated that for the past year he's been doing this off and on over the past year.  After he imbibes, he then walks to another friend's house who doesn't drink and sobers up there before walking home.    Pt stated that he has been in and out of SNFs and ALFs due to health pxs and that, 5 years ago, he literally broke out of [place] so that he could return to his home in [place] to drink.  Pt stated that his wife died 6 years ago and that, after her death, he decided he didn't care about himself anymore.  He was caring for their 35 year old daughter by himself but that, due to his drinking and declining health, he placed her in the care of his older daughter.  She is now 87 and still lives with his older daughter.  He provides financially for her.  Pt's oldest daughter moved him from [place]    Pt  stated that he has been to several SA rehab facilities and that he understands what is needed to keep himself clean.  He has attended AA in the past and doesn't feel that he needs any sort of help for his drinking, at this time.    CSW thanked Pt for his time.   Assessment/plan status:  No Further Intervention Required Other assessment/ plan:   Information/referral to community resources:   Pt declined    PATIENT'S/FAMILY'S RESPONSE TO PLAN OF CARE: Pt thanked CSW for time and assistance.   Providence Crosby, LCSWA Clinical Social Work [phone]

## 2012-04-17 NOTE — H&P (Signed)
[facility] History and Physical  Tony Boyd RUE:[id number] DOB: [birth year]    PCP:   [name], DO   Chief Complaint: brought in by family for altered mental status.  HPI: Tony Boyd is an 57 y.o. male with history of episodic confusion, alcoholic abuse with ETOH cirrhosis, prior hx of hapatic encephalopathy, brought to the ER as he was confused and was lying on the floor.  There has been no reported seizure activities.  He actually denied drinking any alcohol, said he was drinking apple cider.  Evaluation in the ER showed ETOH level at 186.  His NH3 level was 80, and his Na was 120.  He has a bicarb level of 18, and renal fx along with LFTs were unremarkable.  His alkaline phosphatase was a little elevated.  He has a negative head CT.  Hospitalist was asked to admit him for altered mental status.  Rewiew of Systems:  Constitutional: Negative for malaise, fever and chills. No significant weight loss or weight gain Eyes: Negative for eye pain, redness and discharge, diplopia, visual changes, or flashes of light. ENMT: Negative for ear pain, hoarseness, nasal congestion, sinus pressure and sore throat. No headaches; tinnitus, drooling, or problem swallowing. Cardiovascular: Negative for chest pain, palpitations, diaphoresis, dyspnea and peripheral edema. ; No orthopnea, PND Respiratory: Negative for cough, hemoptysis, wheezing and stridor. No pleuritic chestpain. Gastrointestinal: Negative for nausea, vomiting, diarrhea, constipation, abdominal pain, melena, blood in stool, hematemesis, jaundice and rectal bleeding.    Genitourinary: Negative for frequency, dysuria, incontinence,flank pain and hematuria; Musculoskeletal: Negative for back pain and neck pain. Negative for swelling and trauma.;  Skin: . Negative for pruritus, rash, abrasions, bruising and skin lesion.; ulcerations Neuro: Negative for headache, lightheadedness and neck stiffness. Negative for weakness, altered level  of consciousness , altered mental status, extremity weakness, burning feet, involuntary movement, seizure and syncope.  Psych: negative for anxiety, depression, insomnia, tearfulness, panic attacks, hallucinations, paranoia, suicidal or homicidal ideation    Past Medical History  Diagnosis Date  . Cirrhosis of liver   . Hepatitis C   . ETOH abuse   . Hypothyroid   . Psychosis   . Bipolar disorder   . Seizures   . Arthritis   . Back pain   . Coagulopathy     Hx of  . Thrombocytopenia     Hx of  . Pancytopenia     Hx of  . H/O hypokalemia   . Hyponatremia     Hx of  . Korsakoff psychosis   . H/O abdominal abscess   . H/O esophageal varices   . Metabolic encephalopathy   . Hepatic encephalopathy   . Urinary urgency   . H/O renal failure   . H/O ascites   . Altered mental status   . Anemia   . Ascites     Past Surgical History  Procedure Laterality Date  . Colostomy    . Cholecystectomy    . Small intestine surgery    . Arm surgery      left arm  . US guided drain placement in ventral hernia abscess  07/21/2010  . Multiple picc line placements    . Esophagogastroduodenoscopy  06/28/2011    Procedure: ESOPHAGOGASTRODUODENOSCOPY (EGD);  Surgeon: Louis Meckel, MD;  Location: [facility];  Service: Endoscopy;  Laterality: N/A;    Medications:  HOME MEDS: Prior to Admission medications   Medication Sig Start Date End Date Taking? Authorizing Provider  ALPRAZolam Prudy Feeler) 1 MG tablet Take 1 tablet (  1 mg total) by mouth 2 (two) times daily as needed for sleep or anxiety. For anxiety. 01/26/12  Yes Alison Murray, MD  ferrous sulfate 325 (65 FE) MG tablet Take 325 mg by mouth at bedtime.   Yes Historical Provider, MD  folic acid (FOLVITE) 1 MG tablet Take 1 mg by mouth daily.   Yes Historical Provider, MD  furosemide (LASIX) 40 MG tablet Take 40 mg by mouth 2 (two) times daily.    Yes Historical Provider, MD  lactulose (CHRONULAC) 10 GM/15ML solution Take 30 g by mouth 5  (five) times daily.   Yes Historical Provider, MD  levETIRAcetam (KEPPRA) 500 MG tablet Take 500 mg by mouth 2 (two) times daily.   Yes Historical Provider, MD  levothyroxine (SYNTHROID, LEVOTHROID) 75 MCG tablet Take 75 mcg by mouth daily.   Yes Historical Provider, MD  Multiple Vitamin (MULTIVITAMIN WITH MINERALS) TABS Take 1 tablet by mouth daily.   Yes Historical Provider, MD  omeprazole (PRILOSEC) 20 MG capsule Take 20 mg by mouth daily.   Yes Historical Provider, MD  oxyCODONE (OXY IR/ROXICODONE) 5 MG immediate release tablet Take 10 mg by mouth every 4 (four) hours as needed for pain.   Yes Historical Provider, MD  potassium chloride (K-DUR,KLOR-CON) 10 MEQ tablet Take 10 mEq by mouth daily.   Yes Historical Provider, MD  rifaximin (XIFAXAN) 550 MG TABS Take 1 tablet (550 mg total) by mouth 2 (two) times daily. 01/26/12  Yes Alison Murray, MD  risperiDONE (RISPERDAL) 0.25 MG tablet Take 1 tablet (0.25 mg total) by mouth 2 (two) times daily. 01/26/12  Yes Alison Murray, MD  spironolactone (ALDACTONE) 100 MG tablet Take 200 mg by mouth daily.   Yes Historical Provider, MD  nadolol (CORGARD) 40 MG tablet Take 1 tablet (40 mg total) by mouth daily. 04/14/12   Louis Meckel, MD     Allergies:  Allergies  Allergen Reactions  . Droperidol Other (See Comments)    unknown  . Ketorolac Other (See Comments)    unknown  . Ketorolac Tromethamine Other (See Comments)    unknown  . Penicillins Other (See Comments)    unkown    Social History:   reports that he quit smoking about 4 years ago. His smoking use included Cigarettes. He smoked 0.00 packs per day. He has never used smokeless tobacco. He reports that he does not drink alcohol or use illicit drugs.  Family History: Family History  Problem Relation Age of Onset  . Heart disease Mother     MI  . Lung cancer Sister   . Lung cancer Brother      Physical Exam: Filed Vitals:   04/16/12 2120 04/16/12 2334  BP: 130/63 132/71   Pulse: 86 91  Temp: 97.9 F (36.6 C)   TempSrc: Oral   Resp: 17 16  SpO2: 99% 100%   Blood pressure 132/71, pulse 91, temperature 97.9 F (36.6 C), temperature source Oral, resp. rate 16, SpO2 100.00%.  GEN:  Pleasant patient lying in the stretcher in no acute distress; cooperative with exam. PSYCH:  alert and oriented x4; does not appear anxious or depressed; affect is appropriate. HEENT: Mucous membranes pink and anicteric; PERRLA; EOM intact; no cervical lymphadenopathy nor thyromegaly or carotid bruit; no JVD; There were no stridor. Neck is very supple. Breasts:: Not examined CHEST WALL: No tenderness CHEST: Normal respiration, clear to auscultation bilaterally.  HEART: Regular rate and rhythm.  There are no murmur, rub, or gallops.  BACK: No kyphosis or scoliosis; no CVA tenderness ABDOMEN: soft and non-tender; no masses, no organomegaly, normal abdominal bowel sounds; no pannus; no intertriginous candida. There is no rebound but he has some distention. Rectal Exam: Not done EXTREMITIES: No bone or joint deformity; age-appropriate arthropathy of the hands and knees; no edema; no ulcerations.  There is no calf tenderness. Genitalia: not examined PULSES: 2+ and symmetric SKIN: Normal hydration no rash or ulceration CNS: Cranial nerves 2-12 grossly intact no focal lateralizing neurologic deficit.  Speech is fluent; uvula elevated with phonation, facial symmetry and tongue midline. DTR are normal bilaterally,barbinski is negative and strengths are equaled bilaterally.  No sensory loss.  He has no asterixis.   Labs on Admission:  Basic Metabolic Panel:  Recent Labs Lab 04/16/12 2123  NA 120*  K 4.5  CL 89*  CO2 18*  GLUCOSE 155*  BUN 8  CREATININE 0.67  CALCIUM 7.9*   Liver Function Tests:  Recent Labs Lab 04/16/12 2123  AST 97*  ALT 49  ALKPHOS 220*  BILITOT 4.4*  PROT 7.4  ALBUMIN 2.4*   No results found for this basename: LIPASE, AMYLASE,  in the last 168  hours  Recent Labs Lab 04/16/12 2230  AMMONIA 80*   CBC:  Recent Labs Lab 04/16/12 2123  WBC 5.0  NEUTROABS 3.3  HGB 12.0*  HCT 33.6*  MCV 98.8  PLT 56*   Cardiac Enzymes: No results found for this basename: CKTOTAL, CKMB, CKMBINDEX, TROPONINI,  in the last 168 hours  CBG: No results found for this basename: GLUCAP,  in the last 168 hours   Radiological Exams on Admission: Dg Chest 2 View  04/16/2012  *RADIOLOGY REPORT*  Clinical Data: Chest pain, short of breath  CHEST - 2 VIEW  Comparison: Prior chest x-ray 10/24/2011  Findings: Similar appearance of the chest with low inspiratory volumes and central bronchitic changes along with bibasilar atelectasis.  No focal consolidation, or edema.  No acute osseous abnormality.  IMPRESSION: Similar appearance of the chest with low inspiratory volumes diffuse central airway thickening/peribronchial cuffing.   Original Report Authenticated By: Malachy Moan, M.D.    Ct Head Wo Contrast  04/16/2012  *RADIOLOGY REPORT*  Clinical Data: Altered mental status, headache, history liver cancer  CT HEAD WITHOUT CONTRAST  Technique:  Contiguous axial images were obtained from the base of the skull through the vertex without contrast.  Comparison: Most recent prior CT head 10/24/2011  Findings: No acute intracranial hemorrhage, acute infarction, mass lesion, mass effect, midline shift or hydrocephalus.  Gray-white differentiation is preserved throughout.  No focal soft tissue or calvarial abnormality.  Normal aeration of the mastoid air cells and paranasal sinuses.  IMPRESSION: No acute intracranial abnormality.   Original Report Authenticated By: Malachy Moan, M.D.     Assessment/Plan Present on Admission:  . Episodic confusion . Hepatic encephalopathy . Hyponatremia . Cirrhosis . Coagulopathy . ETOH abuse . Alcohol intoxication in relapsed alcoholic . Hepatitis C   PLAN:  His confusion could be a combination of hyponatremia, elevated  NH3 level, and that he is intoxicated.  He said he doesn't drink, but I think this is definitely not the rare case of auto-fermentation.  I suspect he will clear as his alcohol level goes down.  Will admit him for altered mental status.  I will give lactullose, IVF with normal saline, and IV lasix if he is volume overloaded.  NS and IV lasix should elevate his serum sodium.  He is at risk for alcohol  withdrawal, so will put him on CIWA protocol.  He is stable, full code, and will be admitted to [facility] service.     Other plans as per orders.  Code Status: FULL Unk Lightning, MD. [facility] Pager [phone] 7pm to 7am.  04/17/2012, 1:36 AM

## 2012-04-17 NOTE — Progress Notes (Signed)
Patient admitted early this AM by Dr. Conley Rolls. Please see H&P.  Check BMP again for Na levels. Overall improved. INR elevated so no VTE prophylaxis.  Marlin Canary DO

## 2012-04-18 DIAGNOSIS — D689 Coagulation defect, unspecified: Secondary | ICD-10-CM

## 2012-04-18 LAB — BASIC METABOLIC PANEL
BUN: 9 mg/dL (ref 6–23)
Calcium: 7.4 mg/dL — ABNORMAL LOW (ref 8.4–10.5)
Creatinine, Ser: 0.72 mg/dL (ref 0.50–1.35)
GFR calc Af Amer: >90 mL/min (ref >90)
GFR calc non Af Amer: >90 mL/min (ref >90)

## 2012-04-18 LAB — CBC
MCH: 35.8 pg — ABNORMAL HIGH (ref 26.0–34.0)
MCHC: 34.8 g/dL (ref 30.0–36.0)
MCV: 102.9 fL — ABNORMAL HIGH (ref 78.0–100.0)
Platelets: 38 10*3/uL — ABNORMAL LOW (ref 150–400)
RDW: 14.9 % (ref 11.5–15.5)
WBC: 4.3 10*3/uL (ref 4.0–10.5)

## 2012-04-18 LAB — URINE CULTURE

## 2012-04-18 MED ORDER — FUROSEMIDE 40 MG PO TABS
40.0000 mg | ORAL_TABLET | Freq: Two times a day (BID) | ORAL | Status: DC
  Administered 2012-04-18 – 2012-04-19 (×2): 40 mg via ORAL
  Filled 2012-04-18 (×4): qty 1

## 2012-04-18 MED ORDER — FUROSEMIDE 40 MG PO TABS
40.0000 mg | ORAL_TABLET | Freq: Two times a day (BID) | ORAL | Status: DC
Start: 2012-04-18 — End: 2012-04-18
  Filled 2012-04-18 (×3): qty 1

## 2012-04-18 NOTE — Progress Notes (Signed)
[facility] PROGRESS NOTE  Tony Boyd WUJ:[id number] DOB: [birth year] DOA: 04/16/2012 PCP: Tony Spikes, DO  Assessment/Plan: 1. Hyponatremia- improved 2. Alcohol abuse/withdrawal- CIWA protocol, no active sign of withdrawing- no tremors 3. Hepatic encephalopathy- resolved with lactulose 4. cirrohsis- patient not interested in quitting so will continue to worsen this 5. Hep C- stable   Code Status: full Family Communication: daughter at bedside Disposition Plan: home Saturday   Consultants:  Social work  Procedures:  none  Antibiotics:  none  HPI/Subjective: Feeling well Does not want to quit drinking, does not see it as a problem  Objective: Filed Vitals:   04/17/12 1440 04/17/12 2200 04/18/12 0405 04/18/12 0600  BP: 136/66 135/70 132/69 118/68  Pulse: 75 67 70 76  Temp: 99 F (37.2 C) 98 F (36.7 C)  98.3 F (36.8 C)  TempSrc: Oral Oral  Oral  Resp: 16 18 17 18   Height:      Weight:      SpO2: 93% 98% 98% 97%    Intake/Output Summary (Last 24 hours) at 04/18/12 1003 Last data filed at 04/18/12 9562  Gross per 24 hour  Intake 2076.25 ml  Output   1652 ml  Net 424.25 ml   Filed Weights   04/17/12 0405  Weight: 123 kg (271 lb 2.7 oz)    Exam:   General:  A+Ox3, NAD  Cardiovascular: rrr  Respiratory: clear anterior  Abdomen: +BS, soft, NT  Data Reviewed: Basic Metabolic Panel:  Recent Labs Lab 04/16/12 2123 04/17/12 0455 04/18/12 0457  NA 120* 128* 129*  K 4.5 4.6 4.3  CL 89* 98 98  CO2 18* 21 27  GLUCOSE 155* 159* 222*  BUN 8 7 9   CREATININE 0.67 0.61 0.72  CALCIUM 7.9* 8.0* 7.4*   Liver Function Tests:  Recent Labs Lab 04/16/12 2123  AST 97*  ALT 49  ALKPHOS 220*  BILITOT 4.4*  PROT 7.4  ALBUMIN 2.4*   No results found for this basename: LIPASE, AMYLASE,  in the last 168 hours  Recent Labs Lab 04/16/12 2230  AMMONIA 80*   CBC:  Recent Labs Lab 04/16/12 2123 04/18/12 0457  WBC 5.0 4.3  NEUTROABS  3.3  --   HGB 12.0* 11.2*  HCT 33.6* 32.2*  MCV 98.8 102.9*  PLT 56* 38*   Cardiac Enzymes: No results found for this basename: CKTOTAL, CKMB, CKMBINDEX, TROPONINI,  in the last 168 hours BNP (last 3 results) No results found for this basename: PROBNP,  in the last 8760 hours CBG: No results found for this basename: GLUCAP,  in the last 168 hours  Recent Results (from the past 240 hour(s))  URINE CULTURE     Status: None   Collection Time    04/16/12 10:16 PM      Result Value Range Status   Specimen Description URINE, RANDOM   Final   Special Requests NONE   Final   Culture  Setup Time 04/17/2012 05:20   Final   Colony Count 7,000 COLONIES/ML   Final   Culture INSIGNIFICANT GROWTH   Final   Report Status 04/18/2012 FINAL   Final     Studies: Dg Chest 2 View  04/16/2012  *RADIOLOGY REPORT*  Clinical Data: Chest pain, short of breath  CHEST - 2 VIEW  Comparison: Prior chest x-ray 10/24/2011  Findings: Similar appearance of the chest with low inspiratory volumes and central bronchitic changes along with bibasilar atelectasis.  No focal consolidation, or edema.  No acute osseous abnormality.  IMPRESSION:  Similar appearance of the chest with low inspiratory volumes diffuse central airway thickening/peribronchial cuffing.   Original Report Authenticated By: Malachy Moan, M.D.    Ct Head Wo Contrast  04/16/2012  *RADIOLOGY REPORT*  Clinical Data: Altered mental status, headache, history liver cancer  CT HEAD WITHOUT CONTRAST  Technique:  Contiguous axial images were obtained from the base of the skull through the vertex without contrast.  Comparison: Most recent prior CT head 10/24/2011  Findings: No acute intracranial hemorrhage, acute infarction, mass lesion, mass effect, midline shift or hydrocephalus.  Gray-white differentiation is preserved throughout.  No focal soft tissue or calvarial abnormality.  Normal aeration of the mastoid air cells and paranasal sinuses.  IMPRESSION: No  acute intracranial abnormality.   Original Report Authenticated By: Malachy Moan, M.D.     Scheduled Meds: . ferrous sulfate  325 mg Oral QHS  . folic acid  1 mg Oral Daily  . furosemide  40 mg Intravenous Daily  . lactulose  30 g Oral 5 X Daily  . levETIRAcetam  500 mg Oral BID  . levothyroxine  75 mcg Oral QAC breakfast  . LORazepam  0-4 mg Intravenous Q6H   Followed by  . [START ON 04/19/2012] LORazepam  0-4 mg Intravenous Q12H  . multivitamin with minerals  1 tablet Oral Daily  . nadolol  40 mg Oral Daily  . pantoprazole  40 mg Oral Daily  . potassium chloride  10 mEq Oral Daily  . rifaximin  550 mg Oral BID  . risperiDONE  0.25 mg Oral BID  . spironolactone  200 mg Oral Daily  . thiamine  100 mg Oral Daily   Or  . thiamine  100 mg Intravenous Daily   Continuous Infusions: . dextrose 5 % and 0.9% NaCl 75 mL/hr at 04/18/12 1610    Principal Problem:   Episodic confusion Active Problems:   Hepatic encephalopathy   ETOH abuse   Hepatitis C   Coagulopathy   Hyponatremia   Cirrhosis   Alcohol intoxication in relapsed alcoholic    Time spent: 35    [facility], Tony Boyd  [facility] Pager [phone]. If 8PM-8AM, please contact night-coverage at [email or web address], password [facility] 04/18/2012, 10:03 AM  LOS: 2 days

## 2012-04-19 LAB — BASIC METABOLIC PANEL
Chloride: 96 mEq/L (ref 96–112)
Creatinine, Ser: 0.75 mg/dL (ref 0.50–1.35)
GFR calc Af Amer: >90 mL/min (ref >90)
GFR calc non Af Amer: >90 mL/min (ref >90)
Potassium: 4.4 mEq/L (ref 3.5–5.1)

## 2012-04-19 LAB — CBC
MCHC: 35.1 g/dL (ref 30.0–36.0)
Platelets: 39 10*3/uL — ABNORMAL LOW (ref 150–400)
RDW: 14.8 % (ref 11.5–15.5)
WBC: 4.7 10*3/uL (ref 4.0–10.5)

## 2012-04-19 MED ORDER — POTASSIUM CHLORIDE CRYS ER 10 MEQ PO TBCR: 10.0000 meq | EXTENDED_RELEASE_TABLET | Freq: Two times a day (BID) | ORAL | Status: DC

## 2012-04-19 MED ORDER — FUROSEMIDE 80 MG PO TABS: 80.0000 mg | ORAL_TABLET | Freq: Two times a day (BID) | ORAL | Status: DC

## 2012-04-19 MED ORDER — FUROSEMIDE 80 MG PO TABS
80.0000 mg | ORAL_TABLET | Freq: Two times a day (BID) | ORAL | Status: DC
  Filled 2012-04-19 (×2): qty 1

## 2012-04-19 NOTE — Discharge Summary (Addendum)
Physician Discharge Summary  Tony Boyd ZOX:[id number] DOB: 10/24/1955 DOA: 04/16/2012  PCP: [name], DO  Admit date: 04/16/2012 Discharge date: 04/19/2012  Time spent: 35 minutes  Recommendations for Outpatient Follow-up:  BMP 1 week re Cr/Na/K Encourage cessation of alcohol  Discharge Diagnoses:  Principal Problem:   Episodic confusion Active Problems:   Hepatic encephalopathy   ETOH abuse   Hepatitis C   Coagulopathy   Hyponatremia   Cirrhosis   Alcohol intoxication in relapsed alcoholic   Discharge Condition: improved  Diet recommendation: cardiac  Filed Weights   04/17/12 0405  Weight: 123 kg (271 lb 2.7 oz)    History of present illness:  Tony Boyd is an 57 y.o. male with history of episodic confusion, alcoholic abuse with ETOH cirrhosis, prior hx of hapatic encephalopathy, brought to the ER as he was confused and was lying on the floor. There has been no reported seizure activities. He actually denied drinking any alcohol, said he was drinking apple cider. Evaluation in the ER showed ETOH level at 186. His NH3 level was 80, and his Na was 120. He has a bicarb level of 18, and renal fx along with LFTs were unremarkable. His alkaline phosphatase was a little elevated. He has a negative head CT. Hospitalist was asked to admit him for altered mental status.   Hospital Course:  1. Hyponatremia- improved with IVF and IV lasix- transitioned to oral lasix at a higher dose and encouraged alcohol cessation 2. Alcohol abuse/withdrawal- CIWA protocol, no active sign of withdrawing- no tremors- patient not interested in quitting at this juncture 3. Hepatic encephalopathy- resolved with lactulose/xifaxin 4. cirrohsis- patient not interested in quitting so will continue to worsen this as will his coagulopathy 5. Hep C- stable   Procedures:  none  Consultations:  Child psychotherapist  Discharge Exam: Filed Vitals:   04/18/12 0600 04/18/12 1200 04/18/12 2230 04/19/12  0600  BP: 118/68 114/68 133/67 127/67  Pulse: 76 70 73 69  Temp: 98.3 F (36.8 C) 98.4 F (36.9 C) 97.4 F (36.3 C) 97.8 F (36.6 C)  TempSrc: Oral Oral Oral Oral  Resp: 18 19 18 18   Height:      Weight:      SpO2: 97% 97% 98% 100%    General: A+Ox3, NAD Cardiovascular: rrr Respiratory: clear anterior  Discharge Instructions      Discharge Orders   Future Appointments Provider Department Dept Phone   05/07/2012 2:45 PM Louis Meckel, MD [facility] [phone]   Future Orders Complete By Expires     Diet - low sodium heart healthy  As directed     Discharge instructions  As directed     Comments:      Stop drinking BMP in 1 week (change lasix dose) Daily multivitamin Titrate lactulose for a goal of at least 3 Bowel movements per day    Increase activity slowly  As directed         Medication List    TAKE these medications       ALPRAZolam 1 MG tablet  Commonly known as:  XANAX  Take 1 tablet (1 mg total) by mouth 2 (two) times daily as needed for sleep or anxiety. For anxiety.     ferrous sulfate 325 (65 FE) MG tablet  Take 325 mg by mouth at bedtime.     folic acid 1 MG tablet  Commonly known as:  FOLVITE  Take 1 mg by mouth daily.     furosemide 80 MG  tablet  Commonly known as:  LASIX  Take 1 tablet (80 mg total) by mouth 2 (two) times daily.     lactulose 10 GM/15ML solution  Commonly known as:  CHRONULAC  Take 30 g by mouth 5 (five) times daily.     levETIRAcetam 500 MG tablet  Commonly known as:  KEPPRA  Take 500 mg by mouth 2 (two) times daily.     levothyroxine 75 MCG tablet  Commonly known as:  SYNTHROID, LEVOTHROID  Take 75 mcg by mouth daily.     multivitamin with minerals Tabs  Take 1 tablet by mouth daily.     nadolol 40 MG tablet  Commonly known as:  CORGARD  Take 1 tablet (40 mg total) by mouth daily.     omeprazole 20 MG capsule  Commonly known as:  PRILOSEC  Take 20 mg by mouth daily.      oxyCODONE 5 MG immediate release tablet  Commonly known as:  Oxy IR/ROXICODONE  Take 10 mg by mouth every 4 (four) hours as needed for pain.     potassium chloride 10 MEQ tablet  Commonly known as:  K-DUR,KLOR-CON  Take 1 tablet (10 mEq total) by mouth 2 (two) times daily.     rifaximin 550 MG Tabs  Commonly known as:  XIFAXAN  Take 1 tablet (550 mg total) by mouth 2 (two) times daily.     risperiDONE 0.25 MG tablet  Commonly known as:  RISPERDAL  Take 1 tablet (0.25 mg total) by mouth 2 (two) times daily.     spironolactone 100 MG tablet  Commonly known as:  ALDACTONE  Take 200 mg by mouth daily.          The results of significant diagnostics from this hospitalization (including imaging, microbiology, ancillary and laboratory) are listed below for reference.    Significant Diagnostic Studies: Dg Chest 2 View  04/16/2012  *RADIOLOGY REPORT*  Clinical Data: Chest pain, short of breath  CHEST - 2 VIEW  Comparison: Prior chest x-ray 10/24/2011  Findings: Similar appearance of the chest with low inspiratory volumes and central bronchitic changes along with bibasilar atelectasis.  No focal consolidation, or edema.  No acute osseous abnormality.  IMPRESSION: Similar appearance of the chest with low inspiratory volumes diffuse central airway thickening/peribronchial cuffing.   Original Report Authenticated By: Malachy Moan, M.D.    Ct Head Wo Contrast  04/16/2012  *RADIOLOGY REPORT*  Clinical Data: Altered mental status, headache, history liver cancer  CT HEAD WITHOUT CONTRAST  Technique:  Contiguous axial images were obtained from the base of the skull through the vertex without contrast.  Comparison: Most recent prior CT head 10/24/2011  Findings: No acute intracranial hemorrhage, acute infarction, mass lesion, mass effect, midline shift or hydrocephalus.  Gray-white differentiation is preserved throughout.  No focal soft tissue or calvarial abnormality.  Normal aeration of the  mastoid air cells and paranasal sinuses.  IMPRESSION: No acute intracranial abnormality.   Original Report Authenticated By: Malachy Moan, M.D.     Microbiology: Recent Results (from the past 240 hour(s))  URINE CULTURE     Status: None   Collection Time    04/16/12 10:16 PM      Result Value Range Status   Specimen Description URINE, RANDOM   Final   Special Requests NONE   Final   Culture  Setup Time 04/17/2012 05:20   Final   Colony Count 7,000 COLONIES/ML   Final   Culture INSIGNIFICANT GROWTH   Final  Report Status 04/18/2012 FINAL   Final     Labs: Basic Metabolic Panel:  Recent Labs Lab 04/16/12 2123 04/17/12 0455 04/18/12 0457 04/19/12 0500  NA 120* 128* 129* 126*  K 4.5 4.6 4.3 4.4  CL 89* 98 98 96  CO2 18* 21 27 26   GLUCOSE 155* 159* 222* 174*  BUN 8 7 9 10   CREATININE 0.67 0.61 0.72 0.75  CALCIUM 7.9* 8.0* 7.4* 7.8*   Liver Function Tests:  Recent Labs Lab 04/16/12 2123  AST 97*  ALT 49  ALKPHOS 220*  BILITOT 4.4*  PROT 7.4  ALBUMIN 2.4*   No results found for this basename: LIPASE, AMYLASE,  in the last 168 hours  Recent Labs Lab 04/16/12 2230  AMMONIA 80*   CBC:  Recent Labs Lab 04/16/12 2123 04/18/12 0457 04/19/12 0500  WBC 5.0 4.3 4.7  NEUTROABS 3.3  --   --   HGB 12.0* 11.2* 11.7*  HCT 33.6* 32.2* 33.3*  MCV 98.8 102.9* 103.4*  PLT 56* 38* 39*   Cardiac Enzymes: No results found for this basename: CKTOTAL, CKMB, CKMBINDEX, TROPONINI,  in the last 168 hours BNP: BNP (last 3 results) No results found for this basename: PROBNP,  in the last 8760 hours CBG: No results found for this basename: GLUCAP,  in the last 168 hours     Signed:  Benjamine Mola, Analaura Messler  [facility] 04/19/2012, 9:08 AM

## 2012-04-19 NOTE — Progress Notes (Signed)
Tony Boyd was given discharge instructions. Follow up appointment in place. Prescriptions given to Mr. Matranga. He verbalizes understanding of discharge instructions with teach back related alcohol consumption.

## 2012-05-06 ENCOUNTER — Ambulatory Visit (HOSPITAL_COMMUNITY)
Admission: RE | Admit: 2012-05-06 | Discharge: 2012-05-06 | Disposition: A | Discharge status: Home / Self Care | Payer: Medicare Other | Source: Ambulatory Visit | Attending: Gastroenterology | Admitting: Gastroenterology

## 2012-05-06 ENCOUNTER — Encounter (HOSPITAL_COMMUNITY): Payer: Self-pay | Admitting: *Deleted

## 2012-05-06 ENCOUNTER — Encounter (HOSPITAL_COMMUNITY)
Admission: RE | Disposition: A | Discharge status: Home / Self Care | Payer: Self-pay | Source: Ambulatory Visit | Attending: Gastroenterology

## 2012-05-06 DIAGNOSIS — I8500 Esophageal varices without bleeding: Secondary | ICD-10-CM | POA: Insufficient documentation

## 2012-05-06 DIAGNOSIS — K7460 Unspecified cirrhosis of liver: Secondary | ICD-10-CM

## 2012-05-06 DIAGNOSIS — K766 Portal hypertension: Secondary | ICD-10-CM | POA: Insufficient documentation

## 2012-05-06 DIAGNOSIS — K319 Disease of stomach and duodenum, unspecified: Secondary | ICD-10-CM | POA: Insufficient documentation

## 2012-05-06 DIAGNOSIS — R188 Other ascites: Secondary | ICD-10-CM

## 2012-05-06 HISTORY — DX: Peripheral vascular disease, unspecified: I73.9

## 2012-05-06 HISTORY — DX: Gastro-esophageal reflux disease without esophagitis: K21.9

## 2012-05-06 HISTORY — PX: ESOPHAGOGASTRODUODENOSCOPY: SHX5428

## 2012-05-06 SURGERY — EGD (ESOPHAGOGASTRODUODENOSCOPY)
Anesthesia: Moderate Sedation

## 2012-05-06 MED ORDER — DIPHENHYDRAMINE HCL 50 MG/ML IJ SOLN
INTRAMUSCULAR | Status: AC
  Filled 2012-05-06: qty 1

## 2012-05-06 MED ORDER — BUTAMBEN-TETRACAINE-BENZOCAINE 2-2-14 % EX AERO
INHALATION_SPRAY | CUTANEOUS | Status: DC | PRN
  Administered 2012-05-06: 2 via TOPICAL

## 2012-05-06 MED ORDER — SODIUM CHLORIDE 0.9 % IV SOLN
INTRAVENOUS | Status: DC
  Administered 2012-05-06: 500 mL via INTRAVENOUS

## 2012-05-06 MED ORDER — MIDAZOLAM HCL 10 MG/2ML IJ SOLN
INTRAMUSCULAR | Status: DC | PRN
  Administered 2012-05-06 (×3): 2 mg via INTRAVENOUS

## 2012-05-06 MED ORDER — FENTANYL CITRATE 0.05 MG/ML IJ SOLN
INTRAMUSCULAR | Status: DC | PRN
  Administered 2012-05-06 (×4): 25 ug via INTRAVENOUS

## 2012-05-06 MED ORDER — FENTANYL CITRATE 0.05 MG/ML IJ SOLN
INTRAMUSCULAR | Status: AC
  Filled 2012-05-06: qty 2

## 2012-05-06 MED ORDER — MIDAZOLAM HCL 10 MG/2ML IJ SOLN
INTRAMUSCULAR | Status: AC
  Filled 2012-05-06: qty 2

## 2012-05-06 NOTE — Op Note (Signed)
[facility] [address] [place], [address]   ENDOSCOPY PROCEDURE REPORT  PATIENT: Tony, Boyd  MR#: [id number] BIRTHDATE: Aug 09, 1955 , 57  yrs. old GENDER: Male ENDOSCOPIST: Louis Meckel, MD REFERRED BY:  [name], M.D. PROCEDURE DATE:  05/06/2012 PROCEDURE:  EGD, diagnostic ASA CLASS:     Class III INDICATIONS: MEDICATIONS: These medications were titrated to patient response per physician's verbal order, Versed 6 mg IV, and Fentanyl 100 mcg IV  TOPICAL ANESTHETIC: Cetacaine Spray  DESCRIPTION OF PROCEDURE: After the risks benefits and alternatives of the procedure were thoroughly explained, informed consent was obtained.  The Pentax Gastroscope [id number] endoscope was introduced through the mouth and advanced to the third portion of the duodenum. Without limitations.  The instrument was slowly withdrawn as the mucosa was fully examined.      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 There was no fresh or old blood.  In the stomach there were portal hypertensive gastropathy changes characterized by submucosal hemorrhagic areas in the fundus and cardia with enlargement of the gastric folds.   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  There was no fresh or old blood.  In the stomach there were portal hypertensive gastropathy changes characterized by submucosal hemorrhagic areas in the fundus and cardia with enlargement of the gastric folds.   The remainder of the upper endoscopy exam was otherwise normal.  Retroflexed views revealed no abnormalities.     The scope was then withdrawn from the patient and the procedure completed.  COMPLICATIONS: There were no complications. ENDOSCOPIC IMPRESSION: 1. Grade 3 varices 2.  portal hypertensive gastropathy  RECOMMENDATIONS: Continue nadolol 40 mg daily  REPEAT EXAM: Endoscopy one year eSigned:  Louis Meckel, MD 05/06/2012 1:06  PM   CC:  PATIENT NAME:  Tony, Boyd MR#: [id number]

## 2012-05-07 ENCOUNTER — Other Ambulatory Visit (INDEPENDENT_AMBULATORY_CARE_PROVIDER_SITE_OTHER): Payer: Medicare Other

## 2012-05-07 ENCOUNTER — Other Ambulatory Visit: Payer: Self-pay | Admitting: Gastroenterology

## 2012-05-07 ENCOUNTER — Ambulatory Visit (INDEPENDENT_AMBULATORY_CARE_PROVIDER_SITE_OTHER): Payer: Medicare Other | Admitting: Gastroenterology

## 2012-05-07 ENCOUNTER — Encounter (HOSPITAL_COMMUNITY): Payer: Self-pay | Admitting: Gastroenterology

## 2012-05-07 ENCOUNTER — Other Ambulatory Visit (HOSPITAL_COMMUNITY): Payer: Self-pay | Admitting: Physical Medicine and Rehabilitation

## 2012-05-07 VITALS — BP 110/62 | HR 80 | Ht 68.0 in | Wt 276.2 lb

## 2012-05-07 DIAGNOSIS — K7460 Unspecified cirrhosis of liver: Secondary | ICD-10-CM

## 2012-05-07 DIAGNOSIS — M549 Dorsalgia, unspecified: Secondary | ICD-10-CM

## 2012-05-07 DIAGNOSIS — IMO0002 Reserved for concepts with insufficient information to code with codable children: Secondary | ICD-10-CM

## 2012-05-07 LAB — BASIC METABOLIC PANEL
Calcium: 7.8 mg/dL — ABNORMAL LOW (ref 8.4–10.5)
GFR: 98.72 mL/min (ref >60.00)
Glucose, Bld: 340 mg/dL — ABNORMAL HIGH (ref 70–99)
Sodium: 125 mEq/L — ABNORMAL LOW (ref 135–145)

## 2012-05-07 NOTE — Progress Notes (Signed)
History of Present Illness:  Mr. Vassell has returned for followup of cirrhosis. Since his last visit she was hospitalized for hepatic encephalopathy. He apparently is still drinking. Endoscopy yesterday demonstrated grade 3 varices. Despite adding Aldactone weight has continued to increase. He denies abdominal pain or shortness of breath.    Review of Systems: Pertinent positive and negative review of systems were noted in the above HPI section. All other review of systems were otherwise negative.    Current Medications, Allergies, Past Medical History, Past Surgical History, Family History and Social History were reviewed in [employer] electronic medical record  Vital signs were reviewed in today's medical record. Physical Exam: General: Chronically ill-appearing male in no acute distress Skin: anicteric Head: Normocephalic and atraumatic Eyes:  sclerae anicteric, EOMI Ears: Normal auditory acuity Mouth: No deformity or lesions Lungs: Clear throughout to auscultation Heart: Regular rate and rhythm; no murmurs, rubs or bruits Abdomen: Soft, non tender.  Abdomen is quite protuberant with a prominent venous pattern. There is gross ascites Rectal:deferred Musculoskeletal: Symmetrical with no gross deformities  Pulses:  Normal pulses noted Extremities: No clubbing, or deformities noted. There are chronic venous stasis changes and 4+ ankle edema Neurological: Alert oriented x 4, grossly nonfocal Psychological:  Alert and cooperative. Normal mood and affect

## 2012-05-07 NOTE — Patient Instructions (Addendum)
You will go to the basement for labs today Follow up in 3 weeks Have your blood work drawn before your appointment (BMET)  1.5 Gram Low Sodium Diet A 1.5 gram sodium diet restricts the amount of sodium in the diet to no more than 1.5 g or 1500 mg daily. The American Heart Association recommends Americans over the age of 23 to consume no more than 1500 mg of sodium each day to reduce the risk of developing high blood pressure. Research also shows that limiting sodium may reduce heart attack and stroke risk. Many foods contain sodium for flavor and sometimes as a preservative. When the amount of sodium in a diet needs to be low, it is important to know what to look for when choosing foods and drinks. The following includes some information and guidelines to help make it easier for you to adapt to a low sodium diet. QUICK TIPS  Do not add salt to food.  Avoid convenience items and fast food.  Choose unsalted snack foods.  Buy lower sodium products, often labeled as "lower sodium" or "no salt added."  Check food labels to learn how much sodium is in 1 serving.  When eating at a restaurant, ask that your food be prepared with less salt or none, if possible. READING FOOD LABELS FOR SODIUM INFORMATION The nutrition facts label is a good place to find how much sodium is in foods. Look for products with no more than 400 mg of sodium per serving. Remember that 1.5 g = 1500 mg. The food label may also list foods as:  Sodium-free: Less than 5 mg in a serving.  Very low sodium: 35 mg or less in a serving.  Low-sodium: 140 mg or less in a serving.  Light in sodium: 50% less sodium in a serving. For example, if a food that usually has 300 mg of sodium is changed to become light in sodium, it will have 150 mg of sodium.  Reduced sodium: 25% less sodium in a serving. For example, if a food that usually has 400 mg of sodium is changed to reduced sodium, it will have 300 mg of sodium. CHOOSING  FOODS Grains  Avoid: Salted crackers and snack items. Some cereals, including instant hot cereals. Bread stuffing and biscuit mixes. Seasoned rice or pasta mixes.  Choose: Unsalted snack items. Low-sodium cereals, oats, puffed wheat and rice, shredded wheat. English muffins and bread. Pasta. Meats  Avoid: Salted, canned, smoked, spiced, pickled meats, including fish and poultry. Bacon, ham, sausage, cold cuts, hot dogs, anchovies.  Choose: Low-sodium canned tuna and salmon. Fresh or frozen meat, poultry, and fish. Dairy  Avoid: Processed cheese and spreads. Cottage cheese. Buttermilk and condensed milk. Regular cheese.  Choose: Milk. Low-sodium cottage cheese. Yogurt. Sour cream. Low-sodium cheese. Fruits and Vegetables  Avoid: Regular canned vegetables. Regular canned tomato sauce and paste. Frozen vegetables in sauces. Olives. Rosita Fire. Relishes. Sauerkraut.  Choose: Low-sodium canned vegetables. Low-sodium tomato sauce and paste. Frozen or fresh vegetables. Fresh and frozen fruit. Condiments  Avoid: Canned and packaged gravies. Worcestershire sauce. Tartar sauce. Barbecue sauce. Soy sauce. Steak sauce. Ketchup. Onion, garlic, and table salt. Meat flavorings and tenderizers.  Choose: Fresh and dried herbs and spices. Low-sodium varieties of mustard and ketchup. Lemon juice. Tabasco sauce. Horseradish. SAMPLE 1.5 GRAM SODIUM MEAL PLAN Breakfast / Sodium (mg)  1 cup low-fat milk / 143 mg  1 whole-wheat English muffin / 240 mg  1 tbs heart-healthy margarine / 153 mg  1 hard-boiled egg /  139 mg  1 small orange / 0 mg Lunch / Sodium (mg)  1 cup raw carrots / 76 mg  2 tbs no salt added peanut butter / 5 mg  2 slices whole-wheat bread / 270 mg  1 tbs jelly / 6 mg   cup red grapes / 2 mg Dinner / Sodium (mg)  1 cup whole-wheat pasta / 2 mg  1 cup low-sodium tomato sauce / 73 mg  3 oz lean ground beef / 57 mg  1 small side salad (1 cup raw spinach leaves,  cup  cucumber,  cup yellow bell pepper) with 1 tsp olive oil and 1 tsp red wine vinegar / 25 mg Snack / Sodium (mg)  1 container low-fat vanilla yogurt / 107 mg  3 graham cracker squares / 127 mg Nutrient Analysis  Calories: 1745  Protein: 75 g  Carbohydrate: 237 g  Fat: 57 g  Sodium: 1425 mg Document Released: 02/12/2005 Document Revised: 05/07/2011 Document Reviewed: 05/16/2009 [facility] Patient Information 2013 Tony Boyd, Tony Boyd.

## 2012-05-07 NOTE — Assessment & Plan Note (Addendum)
Patient has advanced cirrhosis with ascites that is increasingly more difficult to control. He also has encephalopathy which is moderately well-controlled. Recent endoscopy demonstrated stable grade 3 varices. He is on nadolol.  Recommendations #1 check BMET and urine electrolytes; if still sodium avid I will increase Aldactone if potassium is stable #2 low-sodium diet was emphasized

## 2012-05-08 LAB — NA AND K (SODIUM & POTASSIUM), RAND UR
Potassium Urine: 12 mEq/L
Sodium, Ur: 17 mEq/L

## 2012-05-15 ENCOUNTER — Telehealth: Payer: Self-pay | Admitting: *Deleted

## 2012-05-15 NOTE — Telephone Encounter (Addendum)
Patient will be having surgery next week and home health wanted to know if they could continue services with patient. L left a message for the home health lady to call me back. Britta Mccreedy called back and I told her that it was fine for them to continue services. She stated okay and thank you

## 2012-05-15 NOTE — Telephone Encounter (Signed)
error 

## 2012-05-23 ENCOUNTER — Encounter (HOSPITAL_COMMUNITY): Payer: Self-pay | Admitting: Emergency Medicine

## 2012-05-23 ENCOUNTER — Emergency Department (HOSPITAL_COMMUNITY): Payer: Medicare Other

## 2012-05-23 ENCOUNTER — Observation Stay (HOSPITAL_COMMUNITY)
Admission: EM | Admit: 2012-05-23 | Discharge: 2012-05-25 | Disposition: A | Discharge status: Home / Self Care | Payer: Medicare Other | Attending: Internal Medicine | Admitting: Internal Medicine

## 2012-05-23 DIAGNOSIS — D689 Coagulation defect, unspecified: Secondary | ICD-10-CM | POA: Insufficient documentation

## 2012-05-23 DIAGNOSIS — F1020 Alcohol dependence, uncomplicated: Secondary | ICD-10-CM | POA: Insufficient documentation

## 2012-05-23 DIAGNOSIS — K7290 Hepatic failure, unspecified without coma: Secondary | ICD-10-CM

## 2012-05-23 DIAGNOSIS — K7682 Hepatic encephalopathy: Principal | ICD-10-CM | POA: Insufficient documentation

## 2012-05-23 DIAGNOSIS — D696 Thrombocytopenia, unspecified: Secondary | ICD-10-CM | POA: Insufficient documentation

## 2012-05-23 DIAGNOSIS — I8500 Esophageal varices without bleeding: Secondary | ICD-10-CM

## 2012-05-23 DIAGNOSIS — R188 Other ascites: Secondary | ICD-10-CM

## 2012-05-23 DIAGNOSIS — K7030 Alcoholic cirrhosis of liver without ascites: Secondary | ICD-10-CM | POA: Insufficient documentation

## 2012-05-23 DIAGNOSIS — R0601 Orthopnea: Secondary | ICD-10-CM | POA: Insufficient documentation

## 2012-05-23 DIAGNOSIS — B1920 Unspecified viral hepatitis C without hepatic coma: Secondary | ICD-10-CM | POA: Diagnosis present

## 2012-05-23 DIAGNOSIS — R269 Unspecified abnormalities of gait and mobility: Secondary | ICD-10-CM | POA: Insufficient documentation

## 2012-05-23 DIAGNOSIS — R17 Unspecified jaundice: Secondary | ICD-10-CM | POA: Insufficient documentation

## 2012-05-23 DIAGNOSIS — R062 Wheezing: Secondary | ICD-10-CM | POA: Insufficient documentation

## 2012-05-23 DIAGNOSIS — R410 Disorientation, unspecified: Secondary | ICD-10-CM

## 2012-05-23 DIAGNOSIS — R4182 Altered mental status, unspecified: Secondary | ICD-10-CM

## 2012-05-23 DIAGNOSIS — R42 Dizziness and giddiness: Secondary | ICD-10-CM | POA: Insufficient documentation

## 2012-05-23 DIAGNOSIS — F1021 Alcohol dependence, in remission: Secondary | ICD-10-CM

## 2012-05-23 DIAGNOSIS — F29 Unspecified psychosis not due to a substance or known physiological condition: Secondary | ICD-10-CM | POA: Insufficient documentation

## 2012-05-23 DIAGNOSIS — E871 Hypo-osmolality and hyponatremia: Secondary | ICD-10-CM | POA: Insufficient documentation

## 2012-05-23 DIAGNOSIS — G8929 Other chronic pain: Secondary | ICD-10-CM | POA: Insufficient documentation

## 2012-05-23 DIAGNOSIS — I8510 Secondary esophageal varices without bleeding: Secondary | ICD-10-CM | POA: Insufficient documentation

## 2012-05-23 DIAGNOSIS — K7460 Unspecified cirrhosis of liver: Secondary | ICD-10-CM

## 2012-05-23 DIAGNOSIS — F1010 Alcohol abuse, uncomplicated: Secondary | ICD-10-CM

## 2012-05-23 DIAGNOSIS — Z79899 Other long term (current) drug therapy: Secondary | ICD-10-CM | POA: Insufficient documentation

## 2012-05-23 DIAGNOSIS — F10929 Alcohol use, unspecified with intoxication, unspecified: Secondary | ICD-10-CM

## 2012-05-23 DIAGNOSIS — D649 Anemia, unspecified: Secondary | ICD-10-CM | POA: Insufficient documentation

## 2012-05-23 DIAGNOSIS — G319 Degenerative disease of nervous system, unspecified: Secondary | ICD-10-CM | POA: Insufficient documentation

## 2012-05-23 LAB — URINALYSIS, ROUTINE W REFLEX MICROSCOPIC
Leukocytes, UA: NEGATIVE
Nitrite: NEGATIVE
Specific Gravity, Urine: 1.014 (ref 1.005–1.030)
Urobilinogen, UA: 1 mg/dL (ref 0.0–1.0)

## 2012-05-23 LAB — ETHANOL: Alcohol, Ethyl (B): 130 mg/dL — ABNORMAL HIGH (ref 0–11)

## 2012-05-23 LAB — CBC WITH DIFFERENTIAL/PLATELET
Basophils Relative: 0 % (ref 0–1)
Eosinophils Absolute: 0.1 10*3/uL (ref 0.0–0.7)
Hemoglobin: 12.2 g/dL — ABNORMAL LOW (ref 13.0–17.0)
MCH: 36.1 pg — ABNORMAL HIGH (ref 26.0–34.0)
MCHC: 35.6 g/dL (ref 30.0–36.0)
Monocytes Absolute: 0.6 10*3/uL (ref 0.1–1.0)
Neutrophils Relative %: 71 % (ref 43–77)
Platelets: 48 10*3/uL — ABNORMAL LOW (ref 150–400)

## 2012-05-23 LAB — COMPREHENSIVE METABOLIC PANEL
AST: 95 U/L — ABNORMAL HIGH (ref 0–37)
BUN: 10 mg/dL (ref 6–23)
CO2: 24 mEq/L (ref 19–32)
Calcium: 8.6 mg/dL (ref 8.4–10.5)
Creatinine, Ser: 0.77 mg/dL (ref 0.50–1.35)
GFR calc non Af Amer: >90 mL/min (ref >90)

## 2012-05-23 LAB — AMMONIA: Ammonia: 60 umol/L (ref 11–60)

## 2012-05-23 LAB — PROTIME-INR: Prothrombin Time: 17.9 seconds — ABNORMAL HIGH (ref 11.6–15.2)

## 2012-05-23 MED ORDER — RISPERIDONE 0.25 MG PO TABS
0.2500 mg | ORAL_TABLET | Freq: Two times a day (BID) | ORAL | Status: DC
  Administered 2012-05-23 – 2012-05-25 (×4): 0.25 mg via ORAL
  Filled 2012-05-23 (×5): qty 1

## 2012-05-23 MED ORDER — SPIRONOLACTONE 100 MG PO TABS
200.0000 mg | ORAL_TABLET | Freq: Every day | ORAL | Status: DC
  Administered 2012-05-24 – 2012-05-25 (×2): 200 mg via ORAL
  Filled 2012-05-23 (×2): qty 2

## 2012-05-23 MED ORDER — FERROUS SULFATE 325 (65 FE) MG PO TABS
325.0000 mg | ORAL_TABLET | Freq: Every day | ORAL | Status: DC
  Administered 2012-05-23 – 2012-05-24 (×2): 325 mg via ORAL
  Filled 2012-05-23 (×3): qty 1

## 2012-05-23 MED ORDER — SODIUM CHLORIDE 0.9 % IV SOLN
INTRAVENOUS | Status: AC
  Administered 2012-05-23: 20:00:00 via INTRAVENOUS

## 2012-05-23 MED ORDER — ADULT MULTIVITAMIN W/MINERALS CH
1.0000 | ORAL_TABLET | Freq: Every day | ORAL | Status: DC
  Administered 2012-05-24 – 2012-05-25 (×2): 1 via ORAL
  Filled 2012-05-23 (×2): qty 1

## 2012-05-23 MED ORDER — SODIUM CHLORIDE 0.9 % IJ SOLN: 3.0000 mL | Freq: Two times a day (BID) | INTRAMUSCULAR | Status: DC

## 2012-05-23 MED ORDER — LEVETIRACETAM 500 MG PO TABS
500.0000 mg | ORAL_TABLET | Freq: Two times a day (BID) | ORAL | Status: DC
  Administered 2012-05-23 – 2012-05-25 (×4): 500 mg via ORAL
  Filled 2012-05-23 (×5): qty 1

## 2012-05-23 MED ORDER — POTASSIUM CHLORIDE CRYS ER 10 MEQ PO TBCR
10.0000 meq | EXTENDED_RELEASE_TABLET | Freq: Two times a day (BID) | ORAL | Status: DC
  Administered 2012-05-23 – 2012-05-25 (×4): 10 meq via ORAL
  Filled 2012-05-23 (×5): qty 1

## 2012-05-23 MED ORDER — FUROSEMIDE 80 MG PO TABS
80.0000 mg | ORAL_TABLET | Freq: Two times a day (BID) | ORAL | Status: DC
  Administered 2012-05-23 – 2012-05-25 (×4): 80 mg via ORAL
  Filled 2012-05-23 (×6): qty 1

## 2012-05-23 MED ORDER — LEVOTHYROXINE SODIUM 75 MCG PO TABS
75.0000 ug | ORAL_TABLET | Freq: Every day | ORAL | Status: DC
  Administered 2012-05-24 – 2012-05-25 (×2): 75 ug via ORAL
  Filled 2012-05-23 (×3): qty 1

## 2012-05-23 MED ORDER — FOLIC ACID 1 MG PO TABS
1.0000 mg | ORAL_TABLET | Freq: Every day | ORAL | Status: DC
  Administered 2012-05-24 – 2012-05-25 (×2): 1 mg via ORAL
  Filled 2012-05-23 (×2): qty 1

## 2012-05-23 MED ORDER — OXYCODONE HCL 5 MG PO TABS
5.0000 mg | ORAL_TABLET | Freq: Three times a day (TID) | ORAL | Status: DC | PRN
  Administered 2012-05-23 – 2012-05-25 (×5): 10 mg via ORAL
  Filled 2012-05-23 (×5): qty 2

## 2012-05-23 MED ORDER — PANTOPRAZOLE SODIUM 40 MG PO TBEC
40.0000 mg | DELAYED_RELEASE_TABLET | Freq: Every day | ORAL | Status: DC
  Administered 2012-05-24 – 2012-05-25 (×2): 40 mg via ORAL
  Filled 2012-05-23 (×2): qty 1

## 2012-05-23 MED ORDER — OXYCODONE HCL 5 MG PO TABS
5.0000 mg | ORAL_TABLET | Freq: Three times a day (TID) | ORAL | Status: DC | PRN
  Administered 2012-05-23: 5 mg via ORAL
  Filled 2012-05-23: qty 1

## 2012-05-23 MED ORDER — LACTULOSE 10 GM/15ML PO SOLN
30.0000 g | Freq: Every day | ORAL | Status: DC
  Administered 2012-05-23 – 2012-05-24 (×3): 30 g via ORAL
  Filled 2012-05-23 (×7): qty 45

## 2012-05-23 MED ORDER — RIFAXIMIN 550 MG PO TABS
550.0000 mg | ORAL_TABLET | Freq: Two times a day (BID) | ORAL | Status: DC
  Administered 2012-05-23 – 2012-05-25 (×4): 550 mg via ORAL
  Filled 2012-05-23 (×5): qty 1

## 2012-05-23 NOTE — ED Provider Notes (Signed)
History     CSN: [id number]  Arrival date & time 05/23/12  1042   First MD Initiated Contact with Patient 05/23/12 1137      Chief Complaint  Patient presents with  . Cirrhosis  . AMS     (Consider location/radiation/quality/duration/timing/severity/associated sxs/prior treatment) HPI Comments: Tony Boyd is a 57 y.o. male with a history of hepatitis C, alcohol abuse, cirrhosis of the liver and hepatic encephalopathy presents emergency department with change in mental status.  History is obtained primarily from patient's daughter who reports that last evening the patient was found cooking a full meal in the middle of the evening after setting off smokeless arms.  When questioned of this incident patient had no recollection.  Daughter reports concern for repeat encephalopathy.  Patient is a level V caveat due to change in mental status.    Pt currently lives in independent living. Daughter repots that she has seen him worse, but as of Monday his ,mental process has been gradually worsening. She has been watching him closely bc sometimes he "snaps out of it,"  hjowever at this point it is felty that he is a danger to himself.   The history is provided by the patient, medical records and a relative.    Past Medical History  Diagnosis Date  . Cirrhosis of liver   . Hepatitis C   . ETOH abuse   . Hypothyroid   . Psychosis   . Bipolar disorder   . Seizures   . Arthritis   . Back pain   . Coagulopathy     Hx of  . Thrombocytopenia     Hx of  . Pancytopenia     Hx of  . H/O hypokalemia   . Hyponatremia     Hx of  . Korsakoff psychosis   . H/O abdominal abscess   . H/O esophageal varices   . Metabolic encephalopathy   . Hepatic encephalopathy   . Urinary urgency   . H/O renal failure   . H/O ascites   . Altered mental status   . Anemia   . Ascites   . Shortness of breath   . Peripheral vascular disease   . GERD (gastroesophageal reflux disease)     Past Surgical  History  Procedure Laterality Date  . Colostomy    . Cholecystectomy    . Small intestine surgery    . Arm surgery      left arm  . US guided drain placement in ventral hernia abscess  07/21/2010  . Multiple picc line placements    . Esophagogastroduodenoscopy  06/28/2011    Procedure: ESOPHAGOGASTRODUODENOSCOPY (EGD);  Surgeon: Louis Meckel, MD;  Location: [facility];  Service: Endoscopy;  Laterality: N/A;  . Esophagogastroduodenoscopy N/A 05/06/2012    Procedure: ESOPHAGOGASTRODUODENOSCOPY (EGD);  Surgeon: Louis Meckel, MD;  Location: [facility];  Service: Endoscopy;  Laterality: N/A;  . Esophageal banding N/A 05/06/2012    Procedure: ESOPHAGEAL BANDING;  Surgeon: Louis Meckel, MD;  Location: [facility];  Service: Endoscopy;  Laterality: N/A;    Family History  Problem Relation Age of Onset  . Heart disease Mother     MI  . Lung cancer Sister   . Lung cancer Brother     History  Substance Use Topics  . Smoking status: Former Smoker    Types: Cigarettes    Quit date: 02/15/2008  . Smokeless tobacco: Never Used  . Alcohol Use: No      Review of Systems  Unable to perform ROS   Allergies  Droperidol; Ketorolac; Ketorolac tromethamine; and Penicillins  Home Medications   Current Outpatient Rx  Name  Route  Sig  Dispense  Refill  . ALPRAZolam (XANAX) 1 MG tablet   Oral   Take 1 tablet (1 mg total) by mouth 2 (two) times daily as needed for sleep or anxiety. For anxiety.   30 tablet   0   . ferrous sulfate 325 (65 FE) MG tablet   Oral   Take 325 mg by mouth at bedtime.         . folic acid (FOLVITE) 1 MG tablet   Oral   Take 1 mg by mouth daily.         . furosemide (LASIX) 80 MG tablet   Oral   Take 1 tablet (80 mg total) by mouth 2 (two) times daily.   60 tablet   0   . lactulose (CHRONULAC) 10 GM/15ML solution   Oral   Take 30 g by mouth 5 (five) times daily.         [name] levETIRAcetam (KEPPRA) 500 MG tablet   Oral   Take 500 mg by  mouth 2 (two) times daily.         [name] levothyroxine (SYNTHROID, LEVOTHROID) 75 MCG tablet   Oral   Take 75 mcg by mouth daily.         . Multiple Vitamin (MULTIVITAMIN WITH MINERALS) TABS   Oral   Take 1 tablet by mouth daily.         [name] omeprazole (PRILOSEC) 20 MG capsule   Oral   Take 20 mg by mouth daily.         [name] oxyCODONE (OXY IR/ROXICODONE) 5 MG immediate release tablet   Oral   Take 5-10 mg by mouth every 4 (four) hours as needed for pain.          . potassium chloride (K-DUR,KLOR-CON) 10 MEQ tablet   Oral   Take 1 tablet (10 mEq total) by mouth 2 (two) times daily.   60 tablet   0   . rifaximin (XIFAXAN) 550 MG TABS   Oral   Take 1 tablet (550 mg total) by mouth 2 (two) times daily.   60 tablet   0   . risperiDONE (RISPERDAL) 0.25 MG tablet   Oral   Take 1 tablet (0.25 mg total) by mouth 2 (two) times daily.   60 tablet   0   . spironolactone (ALDACTONE) 100 MG tablet   Oral   Take 200 mg by mouth daily.           BP 121/58  Pulse 108  Temp(Src) 97.9 F (36.6 C) (Oral)  Resp 11  SpO2 96%  Physical Exam  Nursing note and vitals reviewed. Constitutional: He is oriented to person, place, and time. He appears well-developed and well-nourished. No distress.  HENT:  Head: Normocephalic and atraumatic.  Eyes: Conjunctivae and EOM are normal.  PERRL  Neck: Normal range of motion.  Cardiovascular:  Mild tachycardia.  Intact distal pulses.  No pitting edema bilaterally  Pulmonary/Chest: Effort normal.  Lungs clear to auscultation bilaterally, normal breathing effort on room air  Abdominal:  Multiple surgical scars seen.  Abdomen distended with positive fluid wave.  Non-tender diffusely.  Unable to palpate for hepatomegaly due to body habitus  Musculoskeletal: Normal range of motion.  Neurological: He is alert and oriented to person, place, and time.  Alert and oriented x3.  Patient  unable to follow all cranial nerve commands.  Poor  coordination.  Normal gait.  Strength 5/5 bilaterally  Skin: Skin is warm and dry. No rash noted. He is not diaphoretic.  Psychiatric: He has a normal mood and affect. His behavior is normal.    ED Course  Procedures (including critical care time)  Labs Reviewed  COMPREHENSIVE METABOLIC PANEL - Abnormal; Notable for the following:    Sodium 126 (*)    Chloride 91 (*)    Glucose, Bld 171 (*)    Albumin 2.4 (*)    AST 95 (*)    ALT 56 (*)    Alkaline Phosphatase 180 (*)    Total Bilirubin 3.4 (*)    All other components within normal limits  PROTIME-INR - Abnormal; Notable for the following:    Prothrombin Time 17.9 (*)    INR 1.52 (*)    All other components within normal limits  CBC WITH DIFFERENTIAL - Abnormal; Notable for the following:    RBC 3.38 (*)    Hemoglobin 12.2 (*)    HCT 34.3 (*)    MCV 101.5 (*)    MCH 36.1 (*)    Platelets 48 (*)    All other components within normal limits  ETHANOL - Abnormal; Notable for the following:    Alcohol, Ethyl (B) 130 (*)    All other components within normal limits  URINALYSIS, ROUTINE W REFLEX MICROSCOPIC  AMMONIA   No results found.   No diagnosis found.   2:38 PM - labs reviewed without acute abnormality or obvious etiology for patient's change in mental status. CT head added. Case discussed w attending who agrees w wk up thus far.  MDM  Altered mental status  57 year old with history of cirrhosis, hepatic encephalopathy and hepatitis C presents emergency Department with daughter for change in mental status. Labs and imaging reviewed. Patient's evaluation today is without clear etiology, however patient has been deemed unsafe to return to independent living.  BP 121/58  Pulse 108  Temp(Src) 97.9 F (36.6 C) (Oral)  Resp 11  SpO2 96% Cases been discussed thoroughly with attending who agrees with plan to admit patient.The patient appears reasonably stabilized for admission considering the current resources, flow, and  capabilities available in the ED at this time, and I doubt any other [facility] requiring further screening and/or treatment in the ED prior to admission.         Jaci Carrel, [place] 05/23/12 1606

## 2012-05-23 NOTE — H&P (Signed)
[facility] History and Physical  Tony Boyd ZOX:[id number] DOB: [birth year] DOA: 05/23/2012  Referring physician: ED PCP: [name], DO   Chief Complaint: Altered mental status for 3-4 days  HPI:  57 year old male with history of alcoholic cirrhosis, hep C with history of hepatic encephalopathy and grade 3 esophageal varices on recent EGD was brought in by his daughter for change in his mental status for past few days. Patient lives in an independent living and continues to drink alcohol almost everyday. As per the daughter patient has episodic confusions for last 3-4 days and last evening was cooking a meal and set off the fire alarm. Patient has no recollection of the event.  In the ED patient did quite oriented and coherent. labs were checked showing low sodium which seems to be chronic, has chronic low platelets and INR of 1.5. Head CT was unremarkable. Ammonia level of 60. Patient informs taking his medications including lactulose and has at least 3-4 bowel movements daily. He however continues to drink almost every day. He denies any change in his weight and his abdominal distention  to be unchanged as well. Denies any fever, chills, headache, blurry vision, nausea, vomiting, hematemesis, melena, chest pain, palpitations, abdominal pain, urinary symptoms. Daughter concerned about his changing mental status and felt that he was unsafe to be living independently. Prior hospitalist called to admit patient regarding his altered mental status and need for safe living environment.  Review of Systems:  Constitutional: Denies fever, chills, diaphoresis, appetite change, has fatigue  HEENT: Denies photophobia, eye pain, redness, hearing loss, ear pain, congestion, sore throat, rhinorrhea, sneezing, mouth sores, trouble swallowing, neck pain, neck stiffness and tinnitus.   Respiratory: Denies SOB, DOE, cough, chest tightness,  and wheezing.   Cardiovascular: Denies chest pain,  palpitations and leg swelling.  Gastrointestinal: Denies nausea, vomiting, abdominal pain, diarrhea, constipation, blood in stool and abdominal distention.  has 3-4 bowel movements every day Genitourinary: Denies dysuria, urgency, frequency, hematuria, flank pain and difficulty urinating.  Musculoskeletal: Denies myalgias, back pain, joint swelling, arthralgias and gait problem.  Skin: Denies pallor, rash and wound.  Neurological: Denies dizziness, seizures, syncope, weakness, light-headedness, numbness and headaches.  Hematological: Denies adenopathy. Easy bruising, personal or family bleeding history  Psychiatric/Behavioral: Denies suicidal ideation, mood changes, confusion, nervousness, sleep disturbance and agitation   Past Medical History  Diagnosis Date  . Cirrhosis of liver   . Hepatitis C   . ETOH abuse   . Hypothyroid   . Psychosis   . Bipolar disorder   . Seizures   . Arthritis   . Back pain   . Coagulopathy     Hx of  . Thrombocytopenia     Hx of  . Pancytopenia     Hx of  . H/O hypokalemia   . Hyponatremia     Hx of  . Korsakoff psychosis   . H/O abdominal abscess   . H/O esophageal varices   . Metabolic encephalopathy   . Hepatic encephalopathy   . Urinary urgency   . H/O renal failure   . H/O ascites   . Altered mental status   . Anemia   . Ascites   . Shortness of breath   . Peripheral vascular disease   . GERD (gastroesophageal reflux disease)    Past Surgical History  Procedure Laterality Date  . Colostomy    . Cholecystectomy    . Small intestine surgery    . Arm surgery      left arm  .  US guided drain placement in ventral hernia abscess  07/21/2010  . Multiple picc line placements    . Esophagogastroduodenoscopy  06/28/2011    Procedure: ESOPHAGOGASTRODUODENOSCOPY (EGD);  Surgeon: Louis Meckel, MD;  Location: [facility];  Service: Endoscopy;  Laterality: N/A;  . Esophagogastroduodenoscopy N/A 05/06/2012    Procedure:  ESOPHAGOGASTRODUODENOSCOPY (EGD);  Surgeon: Louis Meckel, MD;  Location: [facility];  Service: Endoscopy;  Laterality: N/A;  . Esophageal banding N/A 05/06/2012    Procedure: ESOPHAGEAL BANDING;  Surgeon: Louis Meckel, MD;  Location: [facility];  Service: Endoscopy;  Laterality: N/A;   Social History:  reports that he quit smoking about 4 years ago. His smoking use included Cigarettes. He smoked 0.00 packs per day. He has never used smokeless tobacco. He reports that he does not drink alcohol or use illicit drugs.  Allergies  Allergen Reactions  . Droperidol Other (See Comments)    unknown  . Ketorolac Other (See Comments)    unknown  . Ketorolac Tromethamine Other (See Comments)    unknown  . Penicillins Other (See Comments)    unkown    Family History  Problem Relation Age of Onset  . Heart disease Mother     MI  . Lung cancer Sister   . Lung cancer Brother     Prior to Admission medications   Medication Sig Start Date End Date Taking? Authorizing Provider  ALPRAZolam Prudy Feeler) 1 MG tablet Take 1 tablet (1 mg total) by mouth 2 (two) times daily as needed for sleep or anxiety. For anxiety. 01/26/12  Yes Alison Murray, MD  ferrous sulfate 325 (65 FE) MG tablet Take 325 mg by mouth at bedtime.   Yes Historical Provider, MD  folic acid (FOLVITE) 1 MG tablet Take 1 mg by mouth daily.   Yes Historical Provider, MD  furosemide (LASIX) 80 MG tablet Take 1 tablet (80 mg total) by mouth 2 (two) times daily. 04/19/12  Yes Joseph Art, DO  lactulose (CHRONULAC) 10 GM/15ML solution Take 30 g by mouth 5 (five) times daily.   Yes Historical Provider, MD  levETIRAcetam (KEPPRA) 500 MG tablet Take 500 mg by mouth 2 (two) times daily.   Yes Historical Provider, MD  levothyroxine (SYNTHROID, LEVOTHROID) 75 MCG tablet Take 75 mcg by mouth daily.   Yes Historical Provider, MD  Multiple Vitamin (MULTIVITAMIN WITH MINERALS) TABS Take 1 tablet by mouth daily.   Yes Historical Provider, MD   omeprazole (PRILOSEC) 20 MG capsule Take 20 mg by mouth daily.   Yes Historical Provider, MD  oxyCODONE (OXY IR/ROXICODONE) 5 MG immediate release tablet Take 5-10 mg by mouth every 4 (four) hours as needed for pain.    Yes Historical Provider, MD  potassium chloride (K-DUR,KLOR-CON) 10 MEQ tablet Take 1 tablet (10 mEq total) by mouth 2 (two) times daily. 04/19/12  Yes Joseph Art, DO  rifaximin (XIFAXAN) 550 MG TABS Take 1 tablet (550 mg total) by mouth 2 (two) times daily. 01/26/12  Yes Alison Murray, MD  risperiDONE (RISPERDAL) 0.25 MG tablet Take 1 tablet (0.25 mg total) by mouth 2 (two) times daily. 01/26/12  Yes Alison Murray, MD  spironolactone (ALDACTONE) 100 MG tablet Take 200 mg by mouth daily.   Yes Historical Provider, MD    Physical Exam:  Filed Vitals:   05/23/12 1051 05/23/12 1133  BP: 127/64 121/58  Pulse: 104 108  Temp: 98.8 F (37.1 C) 97.9 F (36.6 C)  TempSrc: Oral Oral  Resp: 16  11  SpO2: 96% 96%    Constitutional: Vital signs reviewed.  Patient is a well-developed and well-nourished in no acute distress and cooperative with exam. Head: Normocephalic and atraumatic Ear: TM normal bilaterally Mouth: no erythema or exudates, MMM Eyes: PERRL, EOMI, conjunctivae normal, scleral icterus.  Neck: Supple, Trachea midline normal ROM, No JVD, mass, thyromegaly, or carotid bruit present.  Cardiovascular: RRR, S1 normal, S2 normal, no MRG, pulses symmetric and intact bilaterally Pulmonary/Chest:  CTAB, no wheezes, rales, or rhonchi Abdominal: Distended abdomen with ascites, nontender,  , bowel sounds are normal, no masses, organomegaly, or guarding present.  GU: no CVA tenderness Musculoskeletal: No joint deformities, erythema, or stiffness, ROM full and no nontender Ext: 1+ edema bilaterally , no cyanosis, pulses palpable bilaterally (DP and PT) signs of chronic liver disease including bilateral gynecomastia, caput medusa and palmar erythema present Hematology: no  cervical, inginal, or axillary adenopathy.  Neurological: A&O x3, however has some clouding of water with mild flapping tremor, Strenght is normal and symmetric bilaterally, cranial nerve II-XII are grossly intact, no focal motor deficit, sensory intact to light touch bilaterally.  Skin: Warm, dry and intact. No rash, cyanosis, or clubbing.  Psychiatric: Normal mood and affect. speech and behavior is normal. Judgment and thought content normal. Cognition and memory are normal.   Labs on Admission:  Basic Metabolic Panel:  Recent Labs Lab 05/23/12 1316  NA 126*  K 4.0  CL 91*  CO2 24  GLUCOSE 171*  BUN 10  CREATININE 0.77  CALCIUM 8.6   Liver Function Tests:  Recent Labs Lab 05/23/12 1316  AST 95*  ALT 56*  ALKPHOS 180*  BILITOT 3.4*  PROT 7.0  ALBUMIN 2.4*   No results found for this basename: LIPASE, AMYLASE,  in the last 168 hours  Recent Labs Lab 05/23/12 1316  AMMONIA 60   CBC:  Recent Labs Lab 05/23/12 1143  WBC 5.7  NEUTROABS 4.0  HGB 12.2*  HCT 34.3*  MCV 101.5*  PLT 48*   Cardiac Enzymes: No results found for this basename: CKTOTAL, CKMB, CKMBINDEX, TROPONINI,  in the last 168 hours BNP: No components found with this basename: POCBNP,  CBG: No results found for this basename: GLUCAP,  in the last 168 hours  Radiological Exams on Admission: Ct Head Wo Contrast  05/23/2012  *RADIOLOGY REPORT*  Clinical Data: Unsteady gait.  Dizziness.  Cirrhosis.  CT HEAD WITHOUT CONTRAST  Technique:  Contiguous axial images were obtained from the base of the skull through the vertex without contrast.  Comparison: CT 04/16/2012  Findings: Mild atrophy, unchanged.  Negative for hydrocephalus. Negative for acute infarct or mass.  No intracranial hemorrhage is present.  Calvarium intact.  IMPRESSION: Mild atrophy.  No acute abnormality.   Original Report Authenticated By: Janeece Riggers, M.D.       Assessment/Plan Principal Problem:    Hepatic encephalopathy in the  setting of advanced cirrhosis Appears to have mild hepatic encephalopathy with episode of confusion. Patient continues to drink in the setting of advanced liver disease. Recent EGD showing grade 3 esophageal varices. Follows with Dr. Arlyce Dice With [facility]. Will consult if needed.  -Continue with home dose of lactulose titrated to at least 3-4 bowel movements daily -Continue neurochecks -Continue with Lasix and Aldactone. -Daughter concerned of patient's safety of bleeding independently. Social work consulted from the ED will touch with the patient.PT/OT consulted.      Active Problems:   ETOH abuse Continues to drink regularly. Counseled strongly on alcohol cessation  Hepatitis C Not on any treatment    Coagulopathy Monitor for bleeding. Monitor INR. We'll give him a dose of oral vitamin K    Hyponatremia Appears to be chronic secondary to advanced liver disease. The new home dose of Lasix and Aldactone    DVT prophylaxis: SCD     Code Status: Full code Family Communication: None at bedside Disposition Plan: Likely need skilled nursing facility  Eddie North [facility] Pager [phone]  If 7PM-7AM, please contact night-coverage [email or web address] Password [facility] 05/23/2012, 5:05 PM    Total time spent admission: 50 minutes

## 2012-05-23 NOTE — ED Notes (Signed)
Please call pt's daughter with questions and updates: [phone] [facility]

## 2012-05-23 NOTE — ED Provider Notes (Signed)
Medical screening examination/treatment/procedure(s) were conducted as a shared visit with non-physician practitioner(s) and myself.  I personally evaluated the patient during the encounter  Pt with reported change in altered mentation affirmed by daughter.  Pt with known cirrhosis.  Pt's ETOH was 130, reports 2 alcoholic drinks only last night, but he also doesn't recall cooking and reportedly had fire alarms alerted and pt is demonstrating potential danger to self and others.  He reports that he and daughter have noticed a decline in his mentation and oriented spells even without his drinking alcohol.  He reports he is not a daily drinking, only drinks occasionally.  Daughter had seen pt earlier in the evening and reports he was at or near baseline which he is not currently.    Head CT is otherwise neg   ETOh is up, but other electrolyte and LFT labs are abn but stable compared to priors.  Ammonia is up, but within his typical ranges.     Impression Altered mentation H/o hepatic cirrhosis and encephalopathy Alocholism   Tony Boyd. Arline Ketter, MD 05/23/12 1646

## 2012-05-23 NOTE — Progress Notes (Signed)
Clinical Social Work Department BRIEF PSYCHOSOCIAL ASSESSMENT 05/23/2012  Patient:  Tony Boyd, Tony Boyd     Account Number:  [id number]     Admit date:  05/23/2012  Clinical Social Worker:  Doree Albee  Date/Time:  05/23/2012 05:00 PM  Referred by:  Physician  Date Referred:  05/23/2012 Referred for  SNF Placement  ALF Placement   Other Referral:   Interview type:  Patient Other interview type:    PSYCHOSOCIAL DATA Living Status:  FACILITY Admitted from facility:   Level of care:  Independent Living Primary support name:  [name] Primary support relationship to patient:  CHILD, ADULT Degree of support available:   strong    CURRENT CONCERNS Current Concerns  Post-Acute Placement   Other Concerns:    SOCIAL WORK ASSESSMENT / PLAN CSW met wiht pt at bedsie to complete psychosocial assessment. Patient reports that he currently lives alone in independent living at the [place] [facility]. Patient reprots his daughter, Tamera Punt bring patient medications every day and checks on him. Pt reports that he does not want to go to assisted living or skilled. Pt and CSW discussed possibility of needing some extra assistance. Pt is interested in working with physical therapy for recommendations. However pt is hopeful to return home.    Pt reports that he drinks  one or two beers but that his drinking is not a probelm. However pt BAL is 130 on admission. Per discussion with PA, patient daughter is also concerned regarding patient drinking. CSW will continue to follow pending physical therapy evaluation and recommendations for disposition.   Assessment/plan status:  Psychosocial Support/Ongoing Assessment of Needs Other assessment/ plan:   Information/referral to community resources:   pt did not want assisted livign or skilled nursing resources or substance abuse resources at this time    PATIENT'S/FAMILY'S RESPONSE TO PLAN OF CARE: Pt thanked csw for concern and support. Pt plans to  work with physical therapy to evaluate patient needs upon discharge. Pt is open to home health but hopes to not need alf or snf. Pt states he is not interested in substance abuse counseling.

## 2012-05-23 NOTE — ED Notes (Signed)
Unsuccesfuly attempted to start an PIV, IV team paged.

## 2012-05-23 NOTE — ED Notes (Signed)
Per patient/family, history of cirrhosis, more altered than usual-family would like labs checked

## 2012-05-24 DIAGNOSIS — D689 Coagulation defect, unspecified: Secondary | ICD-10-CM

## 2012-05-24 DIAGNOSIS — D649 Anemia, unspecified: Secondary | ICD-10-CM

## 2012-05-24 DIAGNOSIS — F29 Unspecified psychosis not due to a substance or known physiological condition: Secondary | ICD-10-CM

## 2012-05-24 DIAGNOSIS — R188 Other ascites: Secondary | ICD-10-CM

## 2012-05-24 LAB — COMPREHENSIVE METABOLIC PANEL
Albumin: 2.1 g/dL — ABNORMAL LOW (ref 3.5–5.2)
BUN: 11 mg/dL (ref 6–23)
Chloride: 93 mEq/L — ABNORMAL LOW (ref 96–112)
Creatinine, Ser: 0.87 mg/dL (ref 0.50–1.35)
Total Bilirubin: 3.8 mg/dL — ABNORMAL HIGH (ref 0.3–1.2)
Total Protein: 6.8 g/dL (ref 6.0–8.3)

## 2012-05-24 LAB — CBC
Hemoglobin: 11.6 g/dL — ABNORMAL LOW (ref 13.0–17.0)
MCHC: 35.4 g/dL (ref 30.0–36.0)

## 2012-05-24 LAB — PROTIME-INR
INR: 1.69 — ABNORMAL HIGH (ref 0.00–1.49)
Prothrombin Time: 19.3 seconds — ABNORMAL HIGH (ref 11.6–15.2)

## 2012-05-24 LAB — MRSA PCR SCREENING: MRSA by PCR: NEGATIVE

## 2012-05-24 MED ORDER — LACTULOSE 10 GM/15ML PO SOLN
30.0000 g | ORAL | Status: DC
  Administered 2012-05-24 – 2012-05-25 (×7): 30 g via ORAL
  Filled 2012-05-24 (×12): qty 45

## 2012-05-24 NOTE — Progress Notes (Signed)
UR completed 

## 2012-05-24 NOTE — Progress Notes (Signed)
Pt. Noted to have a history of MRSA however PCR this admission was noted to be negative.

## 2012-05-24 NOTE — Progress Notes (Signed)
[facility] PROGRESS NOTE  Tony Boyd ZOX:[id number] DOB: Sep 01, 1955 DOA: 05/23/2012 PCP: [name], DO  Assessment/Plan  Hepatic encephalopathy in the setting of advanced cirrhosis, appears to be due to not understanding his lactulose regimen.  He was taking fewer doses of lactulose if he had fewer bowel movements.  Spoke at length about making sure that he increases his lactulose if he has fewer BMs.  Per daughter, he does poorly and gets hospitalized if he has fewer than 5 doses of lactulose.   - Advised STRICT abstinence from alcohol - Baseline of 5 doses of lactulose per day - Per patient, he usually has 5 BMs per day on this dose of lactulose, which is fine.  He tolerates this well. - If he has fewer than 5BMs per day, he should increase to 6 or 7 doses of lactulose daily, whatever it takes to keep his thinking clear and at least 5BMs daily - He may also increase the number of doses if he notices his thinking is becoming a little confused.    Active Problems:  ETOH abuse  Continues to drink regularly.  -  Strongly counseled on absolutely NO alcohol.  Hepatitis C  Not on any treatment   Coagulopathy  Monitor for bleeding. Monitor INR. We'll give him a dose of oral vitamin K   Hyponatremia mild stable and asymptomatic. Appears to be chronic secondary to advanced liver disease.  -  Continue home dose of Lasix and Aldactone  Diet:  Healthy heart Access:  PIV IVF:  NS KVO Proph:  SCDs  Code Status: full code Family Communication: spoke with patient and his daughter Disposition Plan:  - Will push the lactulose today and will discharge tomorrow to the care of his daughter who can stay with him and continue aggressive lactulose at home for an additional day.  She is arranging for private RN visits.  He currently has too much residual confusion to be able to follow discharge instructions correctly today by himself.     Consultants:  none  Procedures:  none  Antibiotics:  none   HPI/Subjective:  Denies fevers, chills, headache.  Denies chest pain and shortness of breath.  Denies nausea, vomiting, diarrhea, constipation.  Has not been eating well.   Has chronic stable back pain.    Objective: Filed Vitals:   05/23/12 1133 05/23/12 1907 05/23/12 2200 05/24/12 0600  BP: 121/58 148/81 122/65 117/68  Pulse: 108 88 87 78  Temp: 97.9 F (36.6 C) 98.3 F (36.8 C) 97.7 F (36.5 C) 98.1 F (36.7 C)  TempSrc: Oral Oral Oral Oral  Resp: 11 18 18 18   Height:  5\' 8"  (1.727 m)    Weight:  121.428 kg (267 lb 11.2 oz)    SpO2: 96% 100% 96% 95%    Intake/Output Summary (Last 24 hours) at 05/24/12 1131 Last data filed at 05/24/12 0130  Gross per 24 hour  Intake      0 ml  Output    950 ml  Net   -950 ml   Filed Weights   05/23/12 1907  Weight: 121.428 kg (267 lb 11.2 oz)    Exam:   General:  Caucasian male, mild jaundice and scleral icterus, No acute distress  HEENT:  NCAT, MMM  Cardiovascular:  RRR, nl S1, S2 no mrg, 2+ pulses, warm extremities  Respiratory:  CTAB, no increased WOB  Abdomen:   NABS, soft, mildly TTP in the RUQ (chronic pain since a distant surgery per patient and not changed  form baseline) no rebound or guarding,  Mildly distended  MSK:   Normal tone and bulk, 1+ bilateral LEE  Neuro:  A&Ox4, but with ongoing conversation, notice that he has judgement, memory, and concentration problems with confusion regarding the plan of care.   Grossly intact  Data Reviewed: Basic Metabolic Panel:  Recent Labs Lab 05/23/12 1316 05/24/12 0553  NA 126* 127*  K 4.0 4.0  CL 91* 93*  CO2 24 30  GLUCOSE 171* 225*  BUN 10 11  CREATININE 0.77 0.87  CALCIUM 8.6 8.0*   Liver Function Tests:  Recent Labs Lab 05/23/12 1316 05/24/12 0553  AST 95* 79*  ALT 56* 49  ALKPHOS 180* 165*  BILITOT 3.4* 3.8*  PROT 7.0 6.8  ALBUMIN 2.4* 2.1*   No results found for this  basename: LIPASE, AMYLASE,  in the last 168 hours  Recent Labs Lab 05/23/12 1316  AMMONIA 60   CBC:  Recent Labs Lab 05/23/12 1143 05/24/12 0553  WBC 5.7 3.4*  NEUTROABS 4.0  --   HGB 12.2* 11.6*  HCT 34.3* 32.8*  MCV 101.5* 102.8*  PLT 48* 42*   Cardiac Enzymes: No results found for this basename: CKTOTAL, CKMB, CKMBINDEX, TROPONINI,  in the last 168 hours BNP (last 3 results) No results found for this basename: PROBNP,  in the last 8760 hours CBG: No results found for this basename: GLUCAP,  in the last 168 hours  No results found for this or any previous visit (from the past 240 hour(s)).   Studies: Ct Head Wo Contrast  05/23/2012  *RADIOLOGY REPORT*  Clinical Data: Unsteady gait.  Dizziness.  Cirrhosis.  CT HEAD WITHOUT CONTRAST  Technique:  Contiguous axial images were obtained from the base of the skull through the vertex without contrast.  Comparison: CT 04/16/2012  Findings: Mild atrophy, unchanged.  Negative for hydrocephalus. Negative for acute infarct or mass.  No intracranial hemorrhage is present.  Calvarium intact.  IMPRESSION: Mild atrophy.  No acute abnormality.   Original Report Authenticated By: Janeece Riggers, M.D.     Scheduled Meds: . ferrous sulfate  325 mg Oral QHS  . folic acid  1 mg Oral Daily  . furosemide  80 mg Oral BID  . lactulose  30 g Oral 5 X Daily  . levETIRAcetam  500 mg Oral BID  . levothyroxine  75 mcg Oral QAC breakfast  . multivitamin with minerals  1 tablet Oral Daily  . pantoprazole  40 mg Oral Daily  . potassium chloride  10 mEq Oral BID  . rifaximin  550 mg Oral BID  . risperiDONE  0.25 mg Oral BID  . sodium chloride  3 mL Intravenous Q12H  . spironolactone  200 mg Oral Daily   Continuous Infusions:   Principal Problem:   Hepatic encephalopathy Active Problems:   ETOH abuse   Hepatitis C   Coagulopathy   Hyponatremia   Cirrhosis   Anemia   Ascites   Episodic confusion    Time spent: 30 min    Elnoria Livingston,  Hilliard Borges  [facility] Pager [phone]. If 7PM-7AM, please contact night-coverage at [email or web address], password [facility] 05/24/2012, 11:31 AM  LOS: 1 day

## 2012-05-24 NOTE — Care Management (Signed)
Cm spoke with patient concerning discharge planning. Cm noted CSW's note stating pt's discharge plan includes discharging back to Independent Living Facility with [facility] services. Pt currently active with [facility] for [facility] services. Pt states ambulating with rollator. Pt states adult daughter assist with daily medication regimen and providing groceries. No other needs stated. Awaiting resumption of care orders from MD upon discharge.   Roxy Manns Saidah Kempton,RN,BSN [phone]

## 2012-05-24 NOTE — Progress Notes (Signed)
Chart review.  Noted that, per MD note from today, Pt to return to daughter's care upon d/c.  No further CSW needs identified.  CSW to sign off.  Providence Crosby, LCSWA Clinical Social Work [phone]

## 2012-05-25 MED ORDER — ALBUTEROL SULFATE HFA 108 (90 BASE) MCG/ACT IN AERS: 2.0000 | INHALATION_SPRAY | Freq: Four times a day (QID) | RESPIRATORY_TRACT | Status: DC | PRN

## 2012-05-25 NOTE — Care Management Note (Signed)
    Page 1 of 2   05/25/2012     1:47:15 PM   CARE MANAGEMENT NOTE 05/25/2012  Patient:  Tony Boyd, Tony Boyd   Account Number:  [id number]  Date Initiated:  05/24/2012  Documentation initiated by:  DAVIS,TYMEEKA  Subjective/Objective Assessment:   57 year old male admitted with AMS. PTA pt from independent livingfacility.     Action/Plan:   Home when stable   Anticipated DC Date:  05/25/2012   Anticipated DC Plan:  HOME W HOME HEALTH SERVICES  In-house referral  Clinical Social Worker      DC Planning Services  CM consult      Choice offered to / List presented to:  NA        HH arranged  HH-1 RN      [facility] agency  [facility]   Status of service:  Completed, signed off Medicare Important Message given?   (If response is "NO", the following Medicare IM given date fields will be blank) Date Medicare IM given:   Date Additional Medicare IM given:    Discharge Disposition:  HOME W HOME HEALTH SERVICES  Per UR Regulation:    If discussed at Long Length of Stay Meetings, dates discussed:    Comments:  05/25/12 Karstyn Birkey RN,BSN NCM [phone] [facility] HHRN ORDERED,TC [facility] #[phone] SPOKE TO CHASTITY ABOUT D/C HHRN ORDER/FACE TO FACE,& D/C HOME TODAY,ORDERS RECEIVED-ABLE TO ACCESS FROM [facility],THEY WILL F/U.[facility] INFORMED TO CONTACT PATIENT'S DTR [name] C#[phone] FOR HOME VISIT.  05/24/12 1653 Tony Boyd [phone] Cm spoke with patient concerning discharge planning. Cm noted CSW's note stating pt's discharge plan includes discharging back to Independent Living Facility with [facility] services. Pt currently active with [facility] for [facility] services. Pt states ambulating with rollator. Pt states adult daughter assist with daily medication regimen and providing groceries. No other needs stated. Awaiting resumption of care orders from MD upon discharge.

## 2012-05-25 NOTE — Progress Notes (Signed)
[facility]-Per patient please contact patient's daughter to set up next home visit:[name] Leonette Most c#[phone].Thanks.

## 2012-05-25 NOTE — Progress Notes (Signed)
ACTIVE HHRN,TC [facility] SPOKE TO ANDREA [phone],INFORMED OF HHRN ORDER,& D/C.NO FURTHER D/C NEEDS.

## 2012-05-25 NOTE — Discharge Summary (Signed)
Physician Discharge Summary  Tony Boyd WUJ:[id number] DOB: 07/06/1955 DOA: 05/23/2012  PCP: [name], DO  Admit date: 05/23/2012 Discharge date: 05/25/2012  Recommendations for Outpatient Follow-up:  1. Follow up with your primary care doctor in 1 week of discharge for evaluation of hepatic encephalopathy and repeat blood work, including BMP and CBC, prior to your procedure.    Discharge Diagnoses:  Principal Problem:   Hepatic encephalopathy Active Problems:   ETOH abuse   Hepatitis C   Coagulopathy   Hyponatremia   Cirrhosis   Anemia   Ascites   Episodic confusion   Discharge Condition: stable, improved  Diet recommendation:  Healthy heart, 2gm sodium   Wt Readings from Last 3 Encounters:  05/23/12 121.428 kg (267 lb 11.2 oz)  05/07/12 125.283 kg (276 lb 3.2 oz)  05/06/12 127.007 kg (280 lb)    History of present illness:   57 year old male with history of alcoholic cirrhosis, hep C with history of hepatic encephalopathy and grade 3 esophageal varices on recent EGD was brought in by his daughter for change in his mental status for past few days. Patient lives in an independent living and continues to drink alcohol almost everyday. As per the daughter patient has episodic confusions for last 3-4 days and last evening was cooking a meal and set off the fire alarm. Patient has no recollection of the event.   In the ED patient did quite oriented and coherent. labs were checked showing low sodium which seems to be chronic, has chronic low platelets and INR of 1.5. Head CT was unremarkable. Ammonia level of 60. Patient informs taking his medications including lactulose and has at least 3-4 bowel movements daily. He however continues to drink almost every day. He denies any change in his weight and his abdominal distention to be unchanged as well. Denies any fever, chills, headache, blurry vision, nausea, vomiting, hematemesis, melena, chest pain, palpitations, abdominal pain,  urinary symptoms.   Daughter concerned about his changing mental status and felt that he was unsafe to be living independently. Prior hospitalist called to admit patient regarding his altered mental status and need for safe living environment.   Hospital Course:   Hepatic encephalopathy in the setting of advanced cirrhosis, appears to be due to not understanding his lactulose regimen. He was taking fewer doses of lactulose if he had fewer bowel movements.  He has been compliant with his rifaximin.  Spoke at length about making sure that he increases his lactulose if he has fewer BMs. Per daughter, he does poorly and gets hospitalized if he has fewer than 5 doses of lactulose.  He was given q2-3 hour lactulose and his thinking improved after he had 7-8 BMs.  Advised STRICT abstinence from alcohol.  Baseline of 5 doses of lactulose per day which should cause him to have 5 BMs per day.  He tolerates this well.  If he has fewer than 5BMs per day, he should increase to 6 or 7 doses of lactulose daily, whatever it takes to keep his thinking clear and at least 5BMs daily.  He may also increase the number of doses if he notices his thinking is becoming a little confused. He should take his rifaximin as prescribed.  He is likely not a candidate for transplant because of his ongoing alcohol abuse and his prognosis is likely poor given his progressive encephalopathy despite lactulose and rifaximin therapy and his ongoing EtOH abuse.    ETOH abuse He continues to drink regularly.  Counseled on  absolutely NO alcohol.     Hepatitis C Not on any treatment  Coagulopathy likely due to cirrhosis, no evidence of bleeding.  He received a dose of oral vitamin K.  Hyponatremia mild stable and asymptomatic.  Chronic secondary to advanced liver disease.    Wheezing, chronic in former smoker suggests COPD:  Will give albuterol inhaler trial.  Patient to talk to PCP about breathing problems.  Consider PFTs/sleep study.     Consultants:  none Procedures:  none Antibiotics:  none   Discharge Exam: Filed Vitals:   05/25/12 0537  BP: 107/71  Pulse: 87  Temp: 98.2 F (36.8 C)  Resp: 18   Filed Vitals:   05/24/12 0600 05/24/12 1508 05/24/12 2147 05/25/12 0537  BP: 117/68 129/75 122/79 107/71  Pulse: 78 77 78 87  Temp: 98.1 F (36.7 C) 97.5 F (36.4 C) 97.5 F (36.4 C) 98.2 F (36.8 C)  TempSrc: Oral Oral Oral Oral  Resp: 18 18 18 18   Height:      Weight:      SpO2: 95% 97% 96% 93%   Feels better.  Had 7 BMs overnight and states thinking is clearer.  States he has chronic orthopnea and PND and wheezing which he has not discussed with his PCP before.    General: Caucasian male, mild jaundice and scleral icterus, No acute distress  HEENT: NCAT, MMM  Cardiovascular: RRR, nl S1, S2 no mrg, 2+ pulses, warm extremities  Respiratory: Diminished bilateral BS with high pitched expiratory wheeze, no increased WOB  Abdomen: NABS, soft, mildly TTP in the RUQ (chronic pain since a distant surgery per patient and not changed form baseline) no rebound or guarding, Mildly distended  MSK: Normal tone and bulk, 1+ bilateral LEE  Neuro: A&Ox4, speech is faster and more articulate today.  Able to concentrate and remember discharge instructions. Grossly intact   Discharge Instructions      Discharge Orders   Future Appointments Provider Department Dept Phone   06/09/2012 8:45 AM [facility] [phone]   06/09/2012 2:45 PM Louis Meckel, MD [facility] [phone]   Future Orders Complete By Expires     Call MD for:  difficulty breathing, headache or visual disturbances  As directed     Call MD for:  extreme fatigue  As directed     Call MD for:  hives  As directed     Call MD for:  persistant dizziness or light-headedness  As directed     Call MD for:  persistant nausea and vomiting  As directed     Call MD for:  severe uncontrolled pain  As  directed     Call MD for:  temperature >100.4  As directed     Diet - low sodium heart healthy  As directed     Comments:      2gm sodium    Discharge instructions  As directed     Comments:      Please STOP drinking COMPLETELY.  Please take your rifaximin twice a day as prescribed.  Please take a minimum of 5 doses of lactulose every day to have at least 5 bowel movements daily.  If you have fewer than 5BMs per day, you should increase to 6 or 7 doses of lactulose daily, whatever it takes to keep your thinking clear and have at least 5BMs daily.  You may also increase the number of doses if you notices your thinking is becoming a little  confused.  Please try an albuterol inhaler and see if that helps your breathing some and talk to your doctor about your breathing problems.  Make sure you see your doctor before your surgery next week.    Increase activity slowly  As directed         Medication List    TAKE these medications       albuterol 108 (90 BASE) MCG/ACT inhaler  Commonly known as:  PROVENTIL HFA;VENTOLIN HFA  Inhale 2 puffs into the lungs every 6 (six) hours as needed for wheezing.     ALPRAZolam 1 MG tablet  Commonly known as:  XANAX  Take 1 tablet (1 mg total) by mouth 2 (two) times daily as needed for sleep or anxiety. For anxiety.     ferrous sulfate 325 (65 FE) MG tablet  Take 325 mg by mouth at bedtime.     folic acid 1 MG tablet  Commonly known as:  FOLVITE  Take 1 mg by mouth daily.     furosemide 80 MG tablet  Commonly known as:  LASIX  Take 1 tablet (80 mg total) by mouth 2 (two) times daily.     lactulose 10 GM/15ML solution  Commonly known as:  CHRONULAC  Take 30 g by mouth 5 (five) times daily.     levETIRAcetam 500 MG tablet  Commonly known as:  KEPPRA  Take 500 mg by mouth 2 (two) times daily.     levothyroxine 75 MCG tablet  Commonly known as:  SYNTHROID, LEVOTHROID  Take 75 mcg by mouth daily.     multivitamin with minerals Tabs  Take 1 tablet  by mouth daily.     omeprazole 20 MG capsule  Commonly known as:  PRILOSEC  Take 20 mg by mouth daily.     oxyCODONE 5 MG immediate release tablet  Commonly known as:  Oxy IR/ROXICODONE  Take 5-10 mg by mouth every 4 (four) hours as needed for pain.     potassium chloride 10 MEQ tablet  Commonly known as:  K-DUR,KLOR-CON  Take 1 tablet (10 mEq total) by mouth 2 (two) times daily.     rifaximin 550 MG Tabs  Commonly known as:  XIFAXAN  Take 1 tablet (550 mg total) by mouth 2 (two) times daily.     risperiDONE 0.25 MG tablet  Commonly known as:  RISPERDAL  Take 1 tablet (0.25 mg total) by mouth 2 (two) times daily.     spironolactone 100 MG tablet  Commonly known as:  ALDACTONE  Take 200 mg by mouth daily.       Follow-up Information   Follow up with REED, TIFFANY, DO. Schedule an appointment as soon as possible for a visit in 1 week.   Contact information:   [address] [place] [address] [phone]       The results of significant diagnostics from this hospitalization (including imaging, microbiology, ancillary and laboratory) are listed below for reference.    Significant Diagnostic Studies: Ct Head Wo Contrast  05/23/2012  *RADIOLOGY REPORT*  Clinical Data: Unsteady gait.  Dizziness.  Cirrhosis.  CT HEAD WITHOUT CONTRAST  Technique:  Contiguous axial images were obtained from the base of the skull through the vertex without contrast.  Comparison: CT 04/16/2012  Findings: Mild atrophy, unchanged.  Negative for hydrocephalus. Negative for acute infarct or mass.  No intracranial hemorrhage is present.  Calvarium intact.  IMPRESSION: Mild atrophy.  No acute abnormality.   Original Report Authenticated By: Janeece Riggers,  M.D.     Microbiology: Recent Results (from the past 240 hour(s))  MRSA PCR SCREENING     Status: None   Collection Time    05/24/12  2:25 PM      Result Value Range Status   MRSA by PCR NEGATIVE  NEGATIVE Final   Comment:            The  GeneXpert MRSA Assay (FDA     approved for NASAL specimens     only), is one component of a     comprehensive MRSA colonization     surveillance program. It is not     intended to diagnose MRSA     infection nor to guide or     monitor treatment for     MRSA infections.     Labs: Basic Metabolic Panel:  Recent Labs Lab 05/23/12 1316 05/24/12 0553  NA 126* 127*  K 4.0 4.0  CL 91* 93*  CO2 24 30  GLUCOSE 171* 225*  BUN 10 11  CREATININE 0.77 0.87  CALCIUM 8.6 8.0*   Liver Function Tests:  Recent Labs Lab 05/23/12 1316 05/24/12 0553  AST 95* 79*  ALT 56* 49  ALKPHOS 180* 165*  BILITOT 3.4* 3.8*  PROT 7.0 6.8  ALBUMIN 2.4* 2.1*   No results found for this basename: LIPASE, AMYLASE,  in the last 168 hours  Recent Labs Lab 05/23/12 1316  AMMONIA 60   CBC:  Recent Labs Lab 05/23/12 1143 05/24/12 0553  WBC 5.7 3.4*  NEUTROABS 4.0  --   HGB 12.2* 11.6*  HCT 34.3* 32.8*  MCV 101.5* 102.8*  PLT 48* 42*   Cardiac Enzymes: No results found for this basename: CKTOTAL, CKMB, CKMBINDEX, TROPONINI,  in the last 168 hours BNP: BNP (last 3 results) No results found for this basename: PROBNP,  in the last 8760 hours CBG: No results found for this basename: GLUCAP,  in the last 168 hours  Time coordinating discharge: 45 minutes  Signed:  Myya Meenach  [facility] 05/25/2012, 9:32 AM

## 2012-05-26 DIAGNOSIS — M545 Low back pain, unspecified: Secondary | ICD-10-CM

## 2012-05-26 DIAGNOSIS — B1920 Unspecified viral hepatitis C without hepatic coma: Secondary | ICD-10-CM

## 2012-05-26 DIAGNOSIS — F1020 Alcohol dependence, uncomplicated: Secondary | ICD-10-CM

## 2012-05-26 DIAGNOSIS — K7030 Alcoholic cirrhosis of liver without ascites: Secondary | ICD-10-CM

## 2012-06-01 ENCOUNTER — Encounter (HOSPITAL_COMMUNITY): Payer: Self-pay | Admitting: Radiology

## 2012-06-01 ENCOUNTER — Observation Stay (HOSPITAL_COMMUNITY)
Admission: EM | Admit: 2012-06-01 | Discharge: 2012-06-03 | Disposition: A | Discharge status: Home / Self Care | Payer: Medicare Other | Attending: Internal Medicine | Admitting: Internal Medicine

## 2012-06-01 ENCOUNTER — Emergency Department (HOSPITAL_COMMUNITY): Payer: Medicare Other

## 2012-06-01 DIAGNOSIS — IMO0002 Reserved for concepts with insufficient information to code with codable children: Secondary | ICD-10-CM

## 2012-06-01 DIAGNOSIS — S0100XA Unspecified open wound of scalp, initial encounter: Secondary | ICD-10-CM | POA: Insufficient documentation

## 2012-06-01 DIAGNOSIS — I739 Peripheral vascular disease, unspecified: Secondary | ICD-10-CM | POA: Insufficient documentation

## 2012-06-01 DIAGNOSIS — D649 Anemia, unspecified: Secondary | ICD-10-CM | POA: Diagnosis present

## 2012-06-01 DIAGNOSIS — F1021 Alcohol dependence, in remission: Secondary | ICD-10-CM

## 2012-06-01 DIAGNOSIS — E871 Hypo-osmolality and hyponatremia: Secondary | ICD-10-CM | POA: Diagnosis present

## 2012-06-01 DIAGNOSIS — R55 Syncope and collapse: Secondary | ICD-10-CM | POA: Diagnosis present

## 2012-06-01 DIAGNOSIS — E039 Hypothyroidism, unspecified: Secondary | ICD-10-CM | POA: Diagnosis present

## 2012-06-01 DIAGNOSIS — R410 Disorientation, unspecified: Secondary | ICD-10-CM

## 2012-06-01 DIAGNOSIS — W19XXXA Unspecified fall, initial encounter: Secondary | ICD-10-CM | POA: Insufficient documentation

## 2012-06-01 DIAGNOSIS — R569 Unspecified convulsions: Secondary | ICD-10-CM | POA: Diagnosis present

## 2012-06-01 DIAGNOSIS — K7290 Hepatic failure, unspecified without coma: Secondary | ICD-10-CM

## 2012-06-01 DIAGNOSIS — D539 Nutritional anemia, unspecified: Secondary | ICD-10-CM | POA: Diagnosis present

## 2012-06-01 DIAGNOSIS — K7030 Alcoholic cirrhosis of liver without ascites: Secondary | ICD-10-CM | POA: Insufficient documentation

## 2012-06-01 DIAGNOSIS — F1020 Alcohol dependence, uncomplicated: Secondary | ICD-10-CM | POA: Insufficient documentation

## 2012-06-01 DIAGNOSIS — D689 Coagulation defect, unspecified: Secondary | ICD-10-CM | POA: Diagnosis present

## 2012-06-01 DIAGNOSIS — R188 Other ascites: Secondary | ICD-10-CM

## 2012-06-01 DIAGNOSIS — K7460 Unspecified cirrhosis of liver: Secondary | ICD-10-CM

## 2012-06-01 DIAGNOSIS — R791 Abnormal coagulation profile: Secondary | ICD-10-CM | POA: Insufficient documentation

## 2012-06-01 DIAGNOSIS — Z79899 Other long term (current) drug therapy: Secondary | ICD-10-CM | POA: Insufficient documentation

## 2012-06-01 DIAGNOSIS — I8500 Esophageal varices without bleeding: Secondary | ICD-10-CM | POA: Diagnosis present

## 2012-06-01 DIAGNOSIS — M549 Dorsalgia, unspecified: Secondary | ICD-10-CM | POA: Insufficient documentation

## 2012-06-01 DIAGNOSIS — F1010 Alcohol abuse, uncomplicated: Secondary | ICD-10-CM | POA: Diagnosis present

## 2012-06-01 DIAGNOSIS — D696 Thrombocytopenia, unspecified: Secondary | ICD-10-CM | POA: Diagnosis present

## 2012-06-01 DIAGNOSIS — G40909 Epilepsy, unspecified, not intractable, without status epilepticus: Principal | ICD-10-CM | POA: Diagnosis present

## 2012-06-01 DIAGNOSIS — B1920 Unspecified viral hepatitis C without hepatic coma: Secondary | ICD-10-CM

## 2012-06-01 DIAGNOSIS — K219 Gastro-esophageal reflux disease without esophagitis: Secondary | ICD-10-CM | POA: Insufficient documentation

## 2012-06-01 DIAGNOSIS — D72819 Decreased white blood cell count, unspecified: Secondary | ICD-10-CM | POA: Insufficient documentation

## 2012-06-01 LAB — COMPREHENSIVE METABOLIC PANEL
ALT: 61 U/L — ABNORMAL HIGH (ref 0–53)
AST: 100 U/L — ABNORMAL HIGH (ref 0–37)
Albumin: 2.3 g/dL — ABNORMAL LOW (ref 3.5–5.2)
Calcium: 8.3 mg/dL — ABNORMAL LOW (ref 8.4–10.5)
Creatinine, Ser: 0.89 mg/dL (ref 0.50–1.35)
GFR calc non Af Amer: >90 mL/min (ref >90)
Sodium: 128 mEq/L — ABNORMAL LOW (ref 135–145)
Total Protein: 7.3 g/dL (ref 6.0–8.3)

## 2012-06-01 LAB — RAPID URINE DRUG SCREEN, HOSP PERFORMED
Barbiturates: NOT DETECTED
Benzodiazepines: POSITIVE — AB
Cocaine: NOT DETECTED
Opiates: NOT DETECTED
Tetrahydrocannabinol: NOT DETECTED

## 2012-06-01 LAB — URINALYSIS, ROUTINE W REFLEX MICROSCOPIC
Bilirubin Urine: NEGATIVE
Glucose, UA: NEGATIVE mg/dL
Hgb urine dipstick: NEGATIVE
Ketones, ur: NEGATIVE mg/dL
Nitrite: NEGATIVE
Protein, ur: NEGATIVE mg/dL
Urobilinogen, UA: 0.2 mg/dL (ref 0.0–1.0)
pH: 5.5 (ref 5.0–8.0)

## 2012-06-01 LAB — CBC
MCH: 36.2 pg — ABNORMAL HIGH (ref 26.0–34.0)
MCHC: 35.2 g/dL (ref 30.0–36.0)
MCV: 102.8 fL — ABNORMAL HIGH (ref 78.0–100.0)
Platelets: 50 10*3/uL — ABNORMAL LOW (ref 150–400)
RBC: 3.51 MIL/uL — ABNORMAL LOW (ref 4.22–5.81)
RDW: 14.7 % (ref 11.5–15.5)

## 2012-06-01 MED ORDER — OXYCODONE HCL 5 MG PO TABS
5.0000 mg | ORAL_TABLET | Freq: Once | ORAL | Status: DC
  Administered 2012-06-01: 5 mg via ORAL
  Filled 2012-06-01: qty 1

## 2012-06-01 MED ORDER — ALPRAZOLAM 0.5 MG PO TABS
1.0000 mg | ORAL_TABLET | Freq: Once | ORAL | Status: AC
  Administered 2012-06-01: 1 mg via ORAL
  Filled 2012-06-01: qty 2

## 2012-06-01 MED ORDER — LEVETIRACETAM 500 MG PO TABS
500.0000 mg | ORAL_TABLET | Freq: Two times a day (BID) | ORAL | Status: DC
  Administered 2012-06-01: 500 mg via ORAL
  Filled 2012-06-01: qty 1

## 2012-06-01 MED ORDER — SODIUM CHLORIDE 0.9 % IV SOLN
Freq: Once | INTRAVENOUS | Status: AC
  Administered 2012-06-01: 1000 mL via INTRAVENOUS

## 2012-06-01 MED ORDER — LEVETIRACETAM 500 MG PO TABS: 500.0000 mg | ORAL_TABLET | Freq: Three times a day (TID) | ORAL | Status: DC

## 2012-06-01 NOTE — ED Provider Notes (Signed)
History     CSN: [id number]  Arrival date & time 06/01/12  1737   First MD Initiated Contact with Patient 06/01/12 1758      Chief Complaint  Patient presents with  . Syncopal Episode   . Head Laceration    (Consider location/radiation/quality/duration/timing/severity/associated sxs/prior treatment) HPI Comments: Tony Boyd is a 57 y.o. male and he with history of alcohol abuse, encephalopathy, chronic liver cirrhosis & hep C presents emergency department status post fall.  Patient states that he was hanging out with his friends when he got up with lead to leave, felt dizzy, and then blacked out. Pt has not had black outs before. There was LOC for seconds. Fall was from standing with direct point of impact being posterior scalp.  Bleeding currently contained. Pt denies any changes in thought process, vision or ambulation.   The history is provided by the patient, medical records and a relative.    Past Medical History  Diagnosis Date  . Cirrhosis of liver   . Hepatitis C   . ETOH abuse   . Hypothyroid   . Psychosis   . Bipolar disorder   . Seizures   . Arthritis   . Back pain   . Coagulopathy     Hx of  . Thrombocytopenia     Hx of  . Pancytopenia     Hx of  . H/O hypokalemia   . Hyponatremia     Hx of  . Korsakoff psychosis   . H/O abdominal abscess   . H/O esophageal varices   . Metabolic encephalopathy   . Hepatic encephalopathy   . Urinary urgency   . H/O renal failure   . H/O ascites   . Altered mental status   . Anemia   . Ascites   . Shortness of breath   . Peripheral vascular disease   . GERD (gastroesophageal reflux disease)     Past Surgical History  Procedure Laterality Date  . Colostomy    . Cholecystectomy    . Small intestine surgery    . Arm surgery      left arm  . US guided drain placement in ventral hernia abscess  07/21/2010  . Multiple picc line placements    . Esophagogastroduodenoscopy  06/28/2011    Procedure:  ESOPHAGOGASTRODUODENOSCOPY (EGD);  Surgeon: Louis Meckel, MD;  Location: [facility];  Service: Endoscopy;  Laterality: N/A;  . Esophagogastroduodenoscopy N/A 05/06/2012    Procedure: ESOPHAGOGASTRODUODENOSCOPY (EGD);  Surgeon: Louis Meckel, MD;  Location: [facility];  Service: Endoscopy;  Laterality: N/A;  . Esophageal banding N/A 05/06/2012    Procedure: ESOPHAGEAL BANDING;  Surgeon: Louis Meckel, MD;  Location: [facility];  Service: Endoscopy;  Laterality: N/A;    Family History  Problem Relation Age of Onset  . Heart disease Mother     MI  . Lung cancer Sister   . Lung cancer Brother     History  Substance Use Topics  . Smoking status: Former Smoker    Types: Cigarettes    Quit date: 02/15/2008  . Smokeless tobacco: Never Used  . Alcohol Use: No      Review of Systems  Constitutional: Negative for fever, chills and appetite change.  HENT: Negative for congestion.   Eyes: Negative for visual disturbance.  Respiratory: Negative for shortness of breath.   Cardiovascular: Negative for chest pain and leg swelling.  Gastrointestinal: Negative for abdominal pain.  Genitourinary: Negative for dysuria, urgency and frequency.  Skin: Positive for  wound.  Neurological: Positive for syncope. Negative for dizziness, weakness, light-headedness, numbness and headaches.  Psychiatric/Behavioral: Negative for confusion.  All other systems reviewed and are negative.    Allergies  Droperidol; Ketorolac; Ketorolac tromethamine; and Penicillins  Home Medications   Current Outpatient Rx  Name  Route  Sig  Dispense  Refill  . albuterol (PROVENTIL HFA;VENTOLIN HFA) 108 (90 BASE) MCG/ACT inhaler   Inhalation   Inhale 2 puffs into the lungs every 6 (six) hours as needed for wheezing.   1 Inhaler   0   . ALPRAZolam (XANAX) 1 MG tablet   Oral   Take 1 tablet (1 mg total) by mouth 2 (two) times daily as needed for sleep or anxiety. For anxiety.   30 tablet   0   . ferrous  sulfate 325 (65 FE) MG tablet   Oral   Take 325 mg by mouth at bedtime.         . folic acid (FOLVITE) 1 MG tablet   Oral   Take 1 mg by mouth daily.         . furosemide (LASIX) 80 MG tablet   Oral   Take 1 tablet (80 mg total) by mouth 2 (two) times daily.   60 tablet   0   . lactulose (CHRONULAC) 10 GM/15ML solution   Oral   Take 30 g by mouth 5 (five) times daily.         [name] levETIRAcetam (KEPPRA) 500 MG tablet   Oral   Take 500 mg by mouth 2 (two) times daily.         [name] levothyroxine (SYNTHROID, LEVOTHROID) 75 MCG tablet   Oral   Take 75 mcg by mouth daily.         . Multiple Vitamin (MULTIVITAMIN WITH MINERALS) TABS   Oral   Take 1 tablet by mouth daily.         [name] omeprazole (PRILOSEC) 20 MG capsule   Oral   Take 20 mg by mouth daily.         [name] oxyCODONE (OXY IR/ROXICODONE) 5 MG immediate release tablet   Oral   Take 5-10 mg by mouth every 4 (four) hours as needed for pain.          . potassium chloride (K-DUR,KLOR-CON) 10 MEQ tablet   Oral   Take 1 tablet (10 mEq total) by mouth 2 (two) times daily.   60 tablet   0   . rifaximin (XIFAXAN) 550 MG TABS   Oral   Take 550 mg by mouth 2 (two) times daily.         . risperiDONE (RISPERDAL) 0.25 MG tablet   Oral   Take 1 tablet (0.25 mg total) by mouth 2 (two) times daily.   60 tablet   0   . spironolactone (ALDACTONE) 100 MG tablet   Oral   Take 200 mg by mouth daily.           BP 127/64  Temp(Src) 97.8 F (36.6 C) (Oral)  Resp 14  SpO2 98%  Physical Exam  Nursing note and vitals reviewed. Constitutional: He is oriented to person, place, and time. He appears well-developed and well-nourished. No distress.  Patient not in visible distress  HENT:  Head: Normocephalic.  3cm posterior scalp laceration, bleeding contained  Eyes: Conjunctivae and EOM are normal. Pupils are equal, round, and reactive to light.  Neck: Normal range of motion. Neck supple. Normal carotid pulses, no  hepatojugular reflux and  no JVD present. Carotid bruit is not present.  No carotid bruits  Cardiovascular:  RRR, no aberrancy on auscultation, intact distal pulses no pitting edema bilaterally.  Pulmonary/Chest: Effort normal and breath sounds normal. No respiratory distress.  Lungs clear to auscultation bilaterally  Abdominal: Soft. He exhibits no distension. There is no tenderness.  Exam limited by body habitus. Non tender. Multiple surgical scars.   Musculoskeletal: Normal range of motion. He exhibits no tenderness.  Neurological: He is alert and oriented to person, place, and time. He displays a negative Romberg sign.  Cranial nerves III through XII intact, slowed coordination,  strength 5/5 bilaterally. No ataxia  Skin: Skin is warm and dry. No pallor.  Left forearm w dorsal surface abrasion, bleeding contained  Psychiatric: He has a normal mood and affect. His behavior is normal.    ED Course  Procedures (including critical care time) 6:41 PM Pt with witnessed seizure per nursing staff, lasting ~30 seconds and described as upper extremity body tremors w LOC. Per nursing pt was post-ictal for about 2 min, but since has returned to baseline. Pt chart reviewed and he has a history of seizures and is currently on Keppra 500BID. Pt still lives in independent living and is given his medication by his daughter. She has been called and gave his medication this morning. Second dose due at 8 pm. Last seizure was 2 years ago at [facility] & she reports no other prior seizures.    Angiocath insertion Performed by: Jaci Carrel  Consent: Verbal consent obtained. Risks and benefits: risks, benefits and alternatives were discussed Time out: Immediately prior to procedure a "time out" was called to verify the correct patient, procedure, equipment, support staff and site/side marked as required.  Preparation: Patient was prepped and draped in the usual sterile fashion.  Vein Location: anterior arm    Ultrasound Guided  Gauge: 20  Normal blood return and flush without difficulty Patient tolerance: Patient tolerated the procedure well with no immediate complications.  LACERATION REPAIR Performed by: Jaci Carrel Authorized by: Jaci Carrel Consent: Verbal consent obtained. Risks and benefits: risks, benefits and alternatives were discussed Consent given by: patient Patient identity confirmed: provided demographic data Prepped and Draped in normal sterile fashion Wound explored  Laceration Location: posterior scalp  Laceration Length: 3cm  No Foreign Bodies seen or palpated  Anesthesia: local infiltration  Local anesthetic: none  Irrigation method: syringe Amount of cleaning: standard  Skin closure: Staples  Number of sutures: 3  Patient tolerance: Patient tolerated the procedure well with no immediate complications.    Date: 06/01/2012  Rate: 74  Rhythm: normal sinus rhythm  QRS Axis: borderline left axis  Intervals: normal  ST/T Wave abnormalities: normal  Conduction Disutrbances: none  Narrative Interpretation:   Old EKG Reviewed: No significant changes noted     Labs Reviewed  PROTIME-INR - Abnormal; Notable for the following:    Prothrombin Time 18.6 (*)    INR 1.61 (*)    All other components within normal limits  CBC - Abnormal; Notable for the following:    RBC 3.51 (*)    Hemoglobin 12.7 (*)    HCT 36.1 (*)    MCV 102.8 (*)    MCH 36.2 (*)    Platelets 50 (*)    All other components within normal limits  COMPREHENSIVE METABOLIC PANEL - Abnormal; Notable for the following:    Sodium 128 (*)    Chloride 94 (*)    Glucose, Bld 110 (*)  Calcium 8.3 (*)    Albumin 2.3 (*)    AST 100 (*)    ALT 61 (*)    Alkaline Phosphatase 198 (*)    Total Bilirubin 2.3 (*)    All other components within normal limits  ETHANOL - Abnormal; Notable for the following:    Alcohol, Ethyl (B) 20 (*)    All other components within normal limits  URINE  RAPID DRUG SCREEN (HOSP PERFORMED) - Abnormal; Notable for the following:    Benzodiazepines POSITIVE (*)    All other components within normal limits  APTT  URINALYSIS, ROUTINE W REFLEX MICROSCOPIC  AMMONIA   Ct Head Wo Contrast  06/01/2012  *RADIOLOGY REPORT*  Clinical Data: Sudden onset of dizziness.  Fall.  Head laceration.  CT HEAD WITHOUT CONTRAST  Technique:  Contiguous axial images were obtained from the base of the skull through the vertex without contrast.  Comparison: CT head without contrast 05/23/2012.  Findings: No acute cortical infarct, hemorrhage, or mass lesion is present.  Mild generalized atrophy is stable.  A high left parietal scalp laceration is noted.  There is no underlying fracture.  The paranasal sinuses are clear.  Minimal fluid in the inferior left mastoid air cells is stable.  The mastoid air cells are otherwise clear bilaterally.  IMPRESSION:  1.  Stable mild atrophy. 2.  No acute intracranial abnormality.   Original Report Authenticated By: Marin Roberts, M.D.    Medications  LORazepam (ATIVAN) injection 1 mg (1 mg Intravenous Not Given 06/02/12 0045)  levothyroxine (SYNTHROID, LEVOTHROID) tablet 75 mcg (75 mcg Oral Given 06/02/12 0901)  pantoprazole (PROTONIX) EC tablet 40 mg (40 mg Oral Given 06/02/12 1054)  rifaximin (XIFAXAN) tablet 550 mg (550 mg Oral Given 06/02/12 1054)  lactulose (CHRONULAC) 10 GM/15ML solution 30 g (30 g Oral Given 06/02/12 1400)  albuterol (PROVENTIL HFA;VENTOLIN HFA) 108 (90 BASE) MCG/ACT inhaler 2 puff (not administered)  potassium chloride SA (K-DUR,KLOR-CON) CR tablet 10 mEq (10 mEq Oral Given 06/02/12 1059)  ALPRAZolam (XANAX) tablet 1 mg (1 mg Oral Given 06/02/12 1054)  multivitamin with minerals tablet 1 tablet (1 tablet Oral Given 06/02/12 1054)  risperiDONE (RISPERDAL) tablet 0.25 mg (0.25 mg Oral Given 06/02/12 1054)  ferrous sulfate tablet 325 mg (not administered)  folic acid (FOLVITE) tablet 1 mg (1 mg Oral Given 06/02/12 1054)  sodium  chloride 0.9 % injection 3 mL (3 mLs Intravenous Not Given 06/02/12 1000)  sodium chloride 0.9 % injection 3 mL (not administered)  0.9 %  sodium chloride infusion (not administered)  furosemide (LASIX) tablet 80 mg (80 mg Oral Given 06/02/12 0902)  spironolactone (ALDACTONE) tablet 200 mg (200 mg Oral Given 06/02/12 1055)  oxyCODONE (Oxy IR/ROXICODONE) immediate release tablet 5 mg (5 mg Oral Given 06/02/12 1054)  levETIRAcetam (KEPPRA) tablet 500 mg (500 mg Oral Given 06/02/12 1054)  thiamine (VITAMIN B-1) tablet 100 mg (100 mg Oral Given 06/02/12 1356)  LORazepam (ATIVAN) injection 1 mg (not administered)  0.9 %  sodium chloride infusion (1,000 mLs Intravenous New Bag/Given 06/01/12 2152)  ALPRAZolam (XANAX) tablet 1 mg (1 mg Oral Given 06/01/12 2103)     1. Laceration   2. Hyponatremia   3. Vaso vagal episode   4. Seizure      MDM  Patient is a 57 year old male who reports a vasovagal type syncope incident that occurred earlier today causing laceration to head.  Repaired as above.  Labs and imaging reviewed without any acute changes.  Patient seemed to be hyponatremic  with elevated liver enzymes which is chronic for patient. Ammonia levels pending.  Consult to neurology questioning possible medication change/loading dose. Case resumed by attending Dr. Radford Pax who will disposition accordingly.   Please call pt's daughter with questions and updates: [phone] Gabrielle Dare, [place] 06/02/12 1459

## 2012-06-01 NOTE — ED Notes (Signed)
Unable to obtain IV access for pt care. IV RN notified.

## 2012-06-01 NOTE — ED Notes (Signed)
Pt had witness seizure of about 30 sec. Pt had leftward gaze with upper body tremor. Pt became a/o x 4 within 2 min. Post seizure activity. PA Paz aware.

## 2012-06-01 NOTE — ED Notes (Signed)
Bed:WA23<BR> Expected date:<BR> Expected time:<BR> Means of arrival:<BR> Comments:<BR> ems

## 2012-06-01 NOTE — ED Notes (Signed)
Patient transported to CT 

## 2012-06-01 NOTE — ED Notes (Signed)
Seizure pads placed on pt's bedrails. Pt was incontinent of urine. Pt's linens changed. Pt able to follow commands to assist in linen changing.

## 2012-06-01 NOTE — ED Notes (Signed)
Pt BIB EMS. Pt states he was standing in his room and felt dizzy and then "passed out". Pt hit his head on the door frame and has lac to L side of back of head. Bleeding controlled per EMS. Pt a/o x 3 per EMS. Pt has no n/v. PERRLA per EMS.

## 2012-06-02 ENCOUNTER — Encounter (HOSPITAL_COMMUNITY): Payer: Self-pay | Admitting: *Deleted

## 2012-06-02 DIAGNOSIS — D696 Thrombocytopenia, unspecified: Secondary | ICD-10-CM | POA: Diagnosis present

## 2012-06-02 DIAGNOSIS — E039 Hypothyroidism, unspecified: Secondary | ICD-10-CM

## 2012-06-02 DIAGNOSIS — R55 Syncope and collapse: Secondary | ICD-10-CM | POA: Diagnosis present

## 2012-06-02 DIAGNOSIS — R569 Unspecified convulsions: Secondary | ICD-10-CM | POA: Diagnosis present

## 2012-06-02 DIAGNOSIS — K7460 Unspecified cirrhosis of liver: Secondary | ICD-10-CM

## 2012-06-02 DIAGNOSIS — F1010 Alcohol abuse, uncomplicated: Secondary | ICD-10-CM

## 2012-06-02 DIAGNOSIS — E871 Hypo-osmolality and hyponatremia: Secondary | ICD-10-CM

## 2012-06-02 DIAGNOSIS — D539 Nutritional anemia, unspecified: Secondary | ICD-10-CM | POA: Diagnosis present

## 2012-06-02 DIAGNOSIS — G40909 Epilepsy, unspecified, not intractable, without status epilepticus: Secondary | ICD-10-CM

## 2012-06-02 HISTORY — DX: Hypothyroidism, unspecified: E03.9

## 2012-06-02 LAB — COMPREHENSIVE METABOLIC PANEL
ALT: 53 U/L (ref 0–53)
AST: 90 U/L — ABNORMAL HIGH (ref 0–37)
Albumin: 2.1 g/dL — ABNORMAL LOW (ref 3.5–5.2)
Calcium: 8.3 mg/dL — ABNORMAL LOW (ref 8.4–10.5)
GFR calc Af Amer: >90 mL/min (ref >90)
Glucose, Bld: 119 mg/dL — ABNORMAL HIGH (ref 70–99)
Sodium: 127 mEq/L — ABNORMAL LOW (ref 135–145)
Total Protein: 6.3 g/dL (ref 6.0–8.3)

## 2012-06-02 LAB — CBC
MCH: 36.4 pg — ABNORMAL HIGH (ref 26.0–34.0)
MCHC: 35.8 g/dL (ref 30.0–36.0)
Platelets: 36 10*3/uL — ABNORMAL LOW (ref 150–400)
RDW: 14.7 % (ref 11.5–15.5)

## 2012-06-02 MED ORDER — ALBUTEROL SULFATE HFA 108 (90 BASE) MCG/ACT IN AERS: 2.0000 | INHALATION_SPRAY | Freq: Four times a day (QID) | RESPIRATORY_TRACT | Status: DC | PRN

## 2012-06-02 MED ORDER — SODIUM CHLORIDE 0.9 % IJ SOLN: 3.0000 mL | Freq: Two times a day (BID) | INTRAMUSCULAR | Status: DC

## 2012-06-02 MED ORDER — LORAZEPAM 2 MG/ML IJ SOLN: 1.0000 mg | INTRAMUSCULAR | Status: DC | PRN

## 2012-06-02 MED ORDER — RISPERIDONE 0.25 MG PO TABS
0.2500 mg | ORAL_TABLET | Freq: Two times a day (BID) | ORAL | Status: DC
  Administered 2012-06-02 – 2012-06-03 (×4): 0.25 mg via ORAL
  Filled 2012-06-02 (×4): qty 1

## 2012-06-02 MED ORDER — SODIUM CHLORIDE 0.9 % IV SOLN: 250.0000 mL | INTRAVENOUS | Status: DC | PRN

## 2012-06-02 MED ORDER — VITAMIN B-1 100 MG PO TABS
100.0000 mg | ORAL_TABLET | Freq: Every day | ORAL | Status: DC
  Administered 2012-06-02 – 2012-06-03 (×2): 100 mg via ORAL
  Filled 2012-06-02 (×2): qty 1

## 2012-06-02 MED ORDER — SODIUM CHLORIDE 0.9 % IV SOLN: INTRAVENOUS | Status: DC

## 2012-06-02 MED ORDER — PANTOPRAZOLE SODIUM 40 MG PO TBEC
40.0000 mg | DELAYED_RELEASE_TABLET | Freq: Every day | ORAL | Status: DC
  Administered 2012-06-02 – 2012-06-03 (×2): 40 mg via ORAL
  Filled 2012-06-02 (×2): qty 1

## 2012-06-02 MED ORDER — LACTULOSE 10 GM/15ML PO SOLN
30.0000 g | Freq: Every day | ORAL | Status: DC
  Administered 2012-06-02 – 2012-06-03 (×6): 30 g via ORAL
  Filled 2012-06-02 (×12): qty 45

## 2012-06-02 MED ORDER — SPIRONOLACTONE 100 MG PO TABS
200.0000 mg | ORAL_TABLET | Freq: Every day | ORAL | Status: DC
  Administered 2012-06-02 – 2012-06-03 (×2): 200 mg via ORAL
  Filled 2012-06-02 (×2): qty 2

## 2012-06-02 MED ORDER — FUROSEMIDE 80 MG PO TABS
80.0000 mg | ORAL_TABLET | Freq: Two times a day (BID) | ORAL | Status: DC
  Administered 2012-06-02 – 2012-06-03 (×3): 80 mg via ORAL
  Filled 2012-06-02 (×5): qty 1

## 2012-06-02 MED ORDER — ALPRAZOLAM 1 MG PO TABS
1.0000 mg | ORAL_TABLET | Freq: Two times a day (BID) | ORAL | Status: DC | PRN
  Administered 2012-06-02 – 2012-06-03 (×3): 1 mg via ORAL
  Filled 2012-06-02 (×3): qty 1

## 2012-06-02 MED ORDER — SODIUM CHLORIDE 0.9 % IJ SOLN: 3.0000 mL | INTRAMUSCULAR | Status: DC | PRN

## 2012-06-02 MED ORDER — FOLIC ACID 1 MG PO TABS
1.0000 mg | ORAL_TABLET | Freq: Every day | ORAL | Status: DC
Start: 2012-06-02 — End: 2012-06-03
  Administered 2012-06-02 – 2012-06-03 (×2): 1 mg via ORAL
  Filled 2012-06-02 (×2): qty 1

## 2012-06-02 MED ORDER — POTASSIUM CHLORIDE CRYS ER 10 MEQ PO TBCR
10.0000 meq | EXTENDED_RELEASE_TABLET | Freq: Two times a day (BID) | ORAL | Status: DC
  Administered 2012-06-02 – 2012-06-03 (×4): 10 meq via ORAL
  Filled 2012-06-02 (×5): qty 1

## 2012-06-02 MED ORDER — ADULT MULTIVITAMIN W/MINERALS CH
1.0000 | ORAL_TABLET | Freq: Every day | ORAL | Status: DC
Start: 2012-06-02 — End: 2012-06-03
  Administered 2012-06-02 – 2012-06-03 (×2): 1 via ORAL
  Filled 2012-06-02 (×2): qty 1

## 2012-06-02 MED ORDER — LEVETIRACETAM 500 MG PO TABS
500.0000 mg | ORAL_TABLET | Freq: Three times a day (TID) | ORAL | Status: DC
  Administered 2012-06-02 – 2012-06-03 (×4): 500 mg via ORAL
  Filled 2012-06-02 (×6): qty 1

## 2012-06-02 MED ORDER — FERROUS SULFATE 325 (65 FE) MG PO TABS
325.0000 mg | ORAL_TABLET | Freq: Every day | ORAL | Status: DC
  Administered 2012-06-02: 325 mg via ORAL
  Filled 2012-06-02 (×2): qty 1

## 2012-06-02 MED ORDER — SPIRONOLACTONE 100 MG PO TABS: 200.0000 mg | ORAL_TABLET | Freq: Every day | ORAL | Status: DC

## 2012-06-02 MED ORDER — RIFAXIMIN 550 MG PO TABS
550.0000 mg | ORAL_TABLET | Freq: Two times a day (BID) | ORAL | Status: DC
  Administered 2012-06-02 – 2012-06-03 (×3): 550 mg via ORAL
  Filled 2012-06-02 (×4): qty 1

## 2012-06-02 MED ORDER — FUROSEMIDE 80 MG PO TABS
80.0000 mg | ORAL_TABLET | Freq: Two times a day (BID) | ORAL | Status: DC
  Filled 2012-06-02: qty 1

## 2012-06-02 MED ORDER — OXYCODONE HCL 5 MG PO TABS
5.0000 mg | ORAL_TABLET | ORAL | Status: DC | PRN
  Administered 2012-06-02 – 2012-06-03 (×5): 5 mg via ORAL
  Filled 2012-06-02 (×5): qty 1

## 2012-06-02 MED ORDER — LEVOTHYROXINE SODIUM 75 MCG PO TABS
75.0000 ug | ORAL_TABLET | Freq: Every day | ORAL | Status: DC
  Administered 2012-06-02 – 2012-06-03 (×2): 75 ug via ORAL
  Filled 2012-06-02 (×3): qty 1

## 2012-06-02 MED ORDER — LORAZEPAM 2 MG/ML IJ SOLN: 1.0000 mg | Freq: Once | INTRAMUSCULAR | Status: DC

## 2012-06-02 NOTE — H&P (Addendum)
[facility] History and Physical  Jd Mccaster ZOX:[id number] DOB: [birth year] DOA: 06/01/2012  Referring physician: ED PCP: [name], DO   Chief Complaint: Seizure, syncope  HPI: Tony Boyd is a 57 y.o. male just discharged from our service on 3/30 for hepatic encephalopathy (EtOH cirrhosis), who presents after a witnessed syncopal episode with fall while hanging out with his friends.  The patient apparently got up to leave, felt weak and dizzy, and then blacked out.  As a result he was brought to the ED where 2 more witnessed seizure episodes occurred, hospitalist has been asked to admit the patient.  Review of Systems: 12 systems reviewed and otherwise negative.  Past Medical History  Diagnosis Date  . Cirrhosis of liver   . Hepatitis C   . ETOH abuse   . Hypothyroid   . Psychosis   . Bipolar disorder   . Seizures   . Arthritis   . Back pain   . Coagulopathy     Hx of  . Thrombocytopenia     Hx of  . Pancytopenia     Hx of  . H/O hypokalemia   . Hyponatremia     Hx of  . Korsakoff psychosis   . H/O abdominal abscess   . H/O esophageal varices   . Metabolic encephalopathy   . Hepatic encephalopathy   . Urinary urgency   . H/O renal failure   . H/O ascites   . Altered mental status   . Anemia   . Ascites   . Shortness of breath   . Peripheral vascular disease   . GERD (gastroesophageal reflux disease)    Past Surgical History  Procedure Laterality Date  . Colostomy    . Cholecystectomy    . Small intestine surgery    . Arm surgery      left arm  . US guided drain placement in ventral hernia abscess  07/21/2010  . Multiple picc line placements    . Esophagogastroduodenoscopy  06/28/2011    Procedure: ESOPHAGOGASTRODUODENOSCOPY (EGD);  Surgeon: Louis Meckel, MD;  Location: [facility];  Service: Endoscopy;  Laterality: N/A;  . Esophagogastroduodenoscopy N/A 05/06/2012    Procedure: ESOPHAGOGASTRODUODENOSCOPY (EGD);  Surgeon: Louis Meckel, MD;   Location: [facility];  Service: Endoscopy;  Laterality: N/A;  . Esophageal banding N/A 05/06/2012    Procedure: ESOPHAGEAL BANDING;  Surgeon: Louis Meckel, MD;  Location: [facility];  Service: Endoscopy;  Laterality: N/A;   Social History:  reports that he quit smoking about 4 years ago. His smoking use included Cigarettes. He smoked 0.00 packs per day. He has never used smokeless tobacco. He reports that he does not drink alcohol or use illicit drugs. Independent living daughter is HPOA and gives him his meds.  Allergies  Allergen Reactions  . Droperidol Other (See Comments)    unknown  . Ketorolac Other (See Comments)    unknown  . Ketorolac Tromethamine Other (See Comments)    unknown  . Penicillins Other (See Comments)    unkown    Family History  Problem Relation Age of Onset  . Heart disease Mother     MI  . Lung cancer Sister   . Lung cancer Brother     Prior to Admission medications   Medication Sig Start Date End Date Taking? Authorizing Provider  albuterol (PROVENTIL HFA;VENTOLIN HFA) 108 (90 BASE) MCG/ACT inhaler Inhale 2 puffs into the lungs every 6 (six) hours as needed for wheezing. 05/25/12  Yes Renae Fickle, MD  ALPRAZolam (XANAX) 1 MG tablet Take 1 tablet (1 mg total) by mouth 2 (two) times daily as needed for sleep or anxiety. For anxiety. 01/26/12  Yes Alison Murray, MD  ferrous sulfate 325 (65 FE) MG tablet Take 325 mg by mouth at bedtime.   Yes Historical Provider, MD  folic acid (FOLVITE) 1 MG tablet Take 1 mg by mouth daily.   Yes Historical Provider, MD  furosemide (LASIX) 80 MG tablet Take 1 tablet (80 mg total) by mouth 2 (two) times daily. 04/19/12  Yes Joseph Art, DO  lactulose (CHRONULAC) 10 GM/15ML solution Take 30 g by mouth 5 (five) times daily.   Yes Historical Provider, MD  levothyroxine (SYNTHROID, LEVOTHROID) 75 MCG tablet Take 75 mcg by mouth daily.   Yes Historical Provider, MD  Multiple Vitamin (MULTIVITAMIN WITH MINERALS) TABS  Take 1 tablet by mouth daily.   Yes Historical Provider, MD  omeprazole (PRILOSEC) 20 MG capsule Take 20 mg by mouth daily.   Yes Historical Provider, MD  oxyCODONE (OXY IR/ROXICODONE) 5 MG immediate release tablet Take 5-10 mg by mouth every 4 (four) hours as needed for pain.    Yes Historical Provider, MD  potassium chloride (K-DUR,KLOR-CON) 10 MEQ tablet Take 1 tablet (10 mEq total) by mouth 2 (two) times daily. 04/19/12  Yes Joseph Art, DO  rifaximin (XIFAXAN) 550 MG TABS Take 550 mg by mouth 2 (two) times daily. 01/26/12  Yes Alison Murray, MD  risperiDONE (RISPERDAL) 0.25 MG tablet Take 1 tablet (0.25 mg total) by mouth 2 (two) times daily. 01/26/12  Yes Alison Murray, MD  spironolactone (ALDACTONE) 100 MG tablet Take 200 mg by mouth daily.   Yes Historical Provider, MD  levETIRAcetam (KEPPRA) 500 MG tablet Take 1 tablet (500 mg total) by mouth 3 (three) times daily. 06/01/12   Nelia Shi, MD   Physical Exam: Filed Vitals:   06/01/12 1819 06/01/12 2100 06/01/12 2330 06/01/12 2357  BP: 111/59 120/61 101/57 101/57  Pulse:  80 78 74  Temp:  98.2 F (36.8 C) 98.2 F (36.8 C) 98 F (36.7 C)  TempSrc:  Oral Oral Oral  Resp: 15 16 20 13   SpO2: 98% 99% 98%     General:  NAD, resting comfortably in bed, has scalp laceration on head Eyes: PEERLA EOMI, icteric sclera ENT: mucous membranes moist Neck: supple w/o JVD Cardiovascular: RRR w/o MRG Respiratory: CTA B Abdomen: soft, nt, marked distention with ascities and stigmata of chronic liver disease, bs+ Skin: left forearm abrasion Musculoskeletal: MAE, full ROM all 4 extremities Psychiatric: normal tone and affect Neurologic: AAOx3, grossly non-focal  Labs on Admission:  Basic Metabolic Panel:  Recent Labs Lab 06/01/12 1816  NA 128*  K 3.6  CL 94*  CO2 26  GLUCOSE 110*  BUN 10  CREATININE 0.89  CALCIUM 8.3*   Liver Function Tests:  Recent Labs Lab 06/01/12 1816  AST 100*  ALT 61*  ALKPHOS 198*  BILITOT 2.3*   PROT 7.3  ALBUMIN 2.3*   No results found for this basename: LIPASE, AMYLASE,  in the last 168 hours  Recent Labs Lab 06/01/12 2120  AMMONIA 26   CBC:  Recent Labs Lab 06/01/12 1816  WBC 4.1  HGB 12.7*  HCT 36.1*  MCV 102.8*  PLT 50*   Cardiac Enzymes: No results found for this basename: CKTOTAL, CKMB, CKMBINDEX, TROPONINI,  in the last 168 hours  BNP (last 3 results) No results found for this basename: PROBNP,  in the last 8760 hours CBG: No results found for this basename: GLUCAP,  in the last 168 hours  Radiological Exams on Admission: Ct Head Wo Contrast  06/01/2012  *RADIOLOGY REPORT*  Clinical Data: Sudden onset of dizziness.  Fall.  Head laceration.  CT HEAD WITHOUT CONTRAST  Technique:  Contiguous axial images were obtained from the base of the skull through the vertex without contrast.  Comparison: CT head without contrast 05/23/2012.  Findings: No acute cortical infarct, hemorrhage, or mass lesion is present.  Mild generalized atrophy is stable.  A high left parietal scalp laceration is noted.  There is no underlying fracture.  The paranasal sinuses are clear.  Minimal fluid in the inferior left mastoid air cells is stable.  The mastoid air cells are otherwise clear bilaterally.  IMPRESSION:  1.  Stable mild atrophy. 2.  No acute intracranial abnormality.   Original Report Authenticated By: Marin Roberts, M.D.     EKG: Independently reviewed.  Assessment/Plan Principal Problem:   Seizure Active Problems:   Hyponatremia   Cirrhosis   Seizure disorder   1. Seizure and syncope - will go ahead and check orthostatic vitals to make sure this wasn't a factor in the fall (patient is on high doses of lasix and spironolactone but these are chronic and used to treat his cirrhosis and ascities so will continue these for now).  Patient has known history of seizure disorder and takes keppra chronically though he has not had a seizure recently, still he did recently  stop drinking (last drink was before last admission he states and despite the EtOH lab level of 20 I am inclined to believe him as he hasnt really hidden his drinking in the past).  No physical exam findings consistent with withdrawals at this time either.  Neurology consulted to see patient, currently increasing keppra to 500 TID from 500 BID (was the plan when ED was planning on discharging patient before they decided to admit him), will of course see if neurology agrees or recommends a different change. 2. Hyponatremia - chronic, last normal sodium was in aug of 2012, and his sodium today is actually relatively slightly higher than he has been running in the past 2 years, will see what neurology says but in literature search there is little evidence to suggest treatment in chronic cirrhosis of sodiums of greater than 120 in a patient who is not about to get a liver transplant. 3. Cirrhosis - continue home lactulose, ammonia is down significantly from last admit  Neurology has been called by ED to see the patient in consult.  Code Status: Full Code (must indicate code status--if unknown or must be presumed, indicate so) Family Communication: No family in room (indicate person spoken with, if applicable, with phone number if by telephone) Disposition Plan: Admit to obs (indicate anticipated LOS)  Time spent: 70 min  GARDNER, JARED M. [facility] Pager [phone]  If 7PM-7AM, please contact night-coverage [email or web address] Password [email or web address] 06/02/2012, 1:34 AM

## 2012-06-02 NOTE — Progress Notes (Signed)
Patient had another witnessed seizure like activity with the nurse at the bedside. Patient was lying in the bed at this time episode lasted about 15-20 seconds . Patient remained postictal for 15 -20 second, patient was able to state name and location. vss remained stabled except hr increased to 111 but non sustained. O2 sat dropped to 85% nonsustained. Made  Attending MD aware and additional order given.

## 2012-06-02 NOTE — ED Notes (Addendum)
Patient in process of completing discharge and had what appear to be a seizure where he stared off circling the room become weak and ridgett in extremities. Family and nursing staff held patient until episode was over preventing him from falling on the floor,. Placed on floor at the end of episode, alert and oriented, talkative few seconds afterward. Placed back in bed with maxi lift and 4 staff members.  Family at bedside during event. Re evaluate discharge f/u my attending ED doctor . Rechecked vs , were stable . Awaiting additional orders per doctor

## 2012-06-02 NOTE — Consult Note (Signed)
Reason for Consult: seizures, altered mental status Referring Physician: Dr. Sherrie Mustache  CC: seizure activity and confusion  HPI: Tony Boyd is an 57 y.o. male who was at a friends house around noon on 06/01/2012 and was going to go outside to urinate and made it only to the door. He turned around and he doesn't remember much until the hospital. He stated that he had no alcohol over the past 24 hours; although labs note different.   Once his family met him in the ED, they noticed that he went into his "typical seizure" with post-ictal confusion. He was admitted once before for this and was put on Keppra. He states that he has not missed any doses.   He has cirrhosis of liver he says due to ETOH abuse. He does not drink daily now; however, due to this, he has to take lactulose and augments the dose based on his confusion.   He cannot tell me what his typical seizures are like so it is difficult to know if he is confused post-ictal or due to his cirrhosis and the need for more lactulose.  Disabled from War.  Past Medical History  Diagnosis Date  . Cirrhosis of liver   . Hepatitis C   . ETOH abuse   . Hypothyroid   . Psychosis   . Bipolar disorder   . Seizures   . Arthritis   . Back pain   . Coagulopathy     Hx of  . Thrombocytopenia     Hx of  . Pancytopenia     Hx of  . H/O hypokalemia   . Hyponatremia     Hx of  . Korsakoff psychosis   . H/O abdominal abscess   . H/O esophageal varices   . Metabolic encephalopathy   . Hepatic encephalopathy   . Urinary urgency   . H/O renal failure   . H/O ascites   . Altered mental status   . Anemia   . Ascites   . Shortness of breath   . Peripheral vascular disease   . GERD (gastroesophageal reflux disease)   . Thrombocytopenia, acquired 06/02/2012  . Unspecified hypothyroidism 06/02/2012    Past Surgical History  Procedure Laterality Date  . Colostomy    . Cholecystectomy    . Small intestine surgery    . Arm surgery      left  arm  . US guided drain placement in ventral hernia abscess  07/21/2010  . Multiple picc line placements    . Esophagogastroduodenoscopy  06/28/2011    Procedure: ESOPHAGOGASTRODUODENOSCOPY (EGD);  Surgeon: Louis Meckel, MD;  Location: [facility];  Service: Endoscopy;  Laterality: N/A;  . Esophagogastroduodenoscopy N/A 05/06/2012    Procedure: ESOPHAGOGASTRODUODENOSCOPY (EGD);  Surgeon: Louis Meckel, MD;  Location: [facility];  Service: Endoscopy;  Laterality: N/A;  . Esophageal banding N/A 05/06/2012    Procedure: ESOPHAGEAL BANDING;  Surgeon: Louis Meckel, MD;  Location: [facility];  Service: Endoscopy;  Laterality: N/A;    Family History  Problem Relation Age of Onset  . Heart disease Mother     MI  . Lung cancer Sister   . Lung cancer Brother     Social History:  reports that he quit smoking about 4 years ago. His smoking use included Cigarettes. He smoked 0.00 packs per day. He has never used smokeless tobacco. He reports that he does not drink alcohol or use illicit drugs.  Allergies  Allergen Reactions  . Droperidol Other (See Comments)  unknown  . Ketorolac Other (See Comments)    unknown  . Ketorolac Tromethamine Other (See Comments)    unknown  . Penicillins Other (See Comments)    unkown    Current Facility-Administered Medications  Medication Dose Route Frequency Provider Last Rate Last Dose  . 0.9 %  sodium chloride infusion  250 mL Intravenous PRN Hillary Bow, DO      . albuterol (PROVENTIL HFA;VENTOLIN HFA) 108 (90 BASE) MCG/ACT inhaler 2 puff  2 puff Inhalation Q6H PRN Hillary Bow, DO      . ALPRAZolam Prudy Feeler) tablet 1 mg  1 mg Oral BID PRN Hillary Bow, DO   1 mg at 06/02/12 1054  . ferrous sulfate tablet 325 mg  325 mg Oral QHS Hillary Bow, DO      . folic acid (FOLVITE) tablet 1 mg  1 mg Oral Daily Hillary Bow, DO   1 mg at 06/02/12 1054  . furosemide (LASIX) tablet 80 mg  80 mg Oral BID Hillary Bow, DO   80 mg at  06/02/12 0902  . lactulose (CHRONULAC) 10 GM/15ML solution 30 g  30 g Oral 5 X Daily Hillary Bow, DO   30 g at 06/02/12 1400  . levETIRAcetam (KEPPRA) tablet 500 mg  500 mg Oral TID Hillary Bow, DO   500 mg at 06/02/12 1054  . levothyroxine (SYNTHROID, LEVOTHROID) tablet 75 mcg  75 mcg Oral QAC breakfast Hillary Bow, DO   75 mcg at 06/02/12 0901  . LORazepam (ATIVAN) injection 1 mg  1 mg Intravenous Once Sunnie Nielsen, MD      . LORazepam (ATIVAN) injection 1 mg  1 mg Intravenous PRN Elliot Cousin, MD      . multivitamin with minerals tablet 1 tablet  1 tablet Oral Daily Hillary Bow, DO   1 tablet at 06/02/12 1054  . oxyCODONE (Oxy IR/ROXICODONE) immediate release tablet 5 mg  5 mg Oral Q4H PRN Hillary Bow, DO   5 mg at 06/02/12 1054  . pantoprazole (PROTONIX) EC tablet 40 mg  40 mg Oral Daily Hillary Bow, DO   40 mg at 06/02/12 1054  . potassium chloride SA (K-DUR,KLOR-CON) CR tablet 10 mEq  10 mEq Oral BID Hillary Bow, DO   10 mEq at 06/02/12 1059  . rifaximin (XIFAXAN) tablet 550 mg  550 mg Oral BID Hillary Bow, DO   550 mg at 06/02/12 1054  . risperiDONE (RISPERDAL) tablet 0.25 mg  0.25 mg Oral BID Hillary Bow, DO   0.25 mg at 06/02/12 1054  . sodium chloride 0.9 % injection 3 mL  3 mL Intravenous Q12H Hillary Bow, DO      . sodium chloride 0.9 % injection 3 mL  3 mL Intravenous PRN Hillary Bow, DO      . spironolactone (ALDACTONE) tablet 200 mg  200 mg Oral Daily Hillary Bow, DO   200 mg at 06/02/12 1055  . thiamine (VITAMIN B-1) tablet 100 mg  100 mg Oral Daily Elliot Cousin, MD   100 mg at 06/02/12 1356   ROS: History obtained from chart review and the patient  General ROS: negative for - chills, fatigue, fever, night sweats, weight gain or weight loss Psychological ROS: negative for - behavioral disorder, hallucinations, memory difficulties, mood swings or suicidal ideation Ophthalmic ROS: negative for - blurry vision, double  vision, eye pain or loss of vision ENT ROS: negative for -  epistaxis, nasal discharge, oral lesions, sore throat, tinnitus or vertigo Allergy and Immunology ROS: negative for - hives or itchy/watery eyes Hematological and Lymphatic ROS: negative for - bleeding problems, bruising or swollen lymph nodes Endocrine ROS: negative for - galactorrhea, hair pattern changes, polydipsia/polyuria or temperature intolerance Respiratory ROS: negative for - cough, hemoptysis, shortness of breath or wheezing Cardiovascular ROS: negative for - chest pain, dyspnea on exertion, edema or irregular heartbeat Gastrointestinal ROS: negative for - abdominal pain, diarrhea, hematemesis, nausea/vomiting or stool incontinence Genito-Urinary ROS: negative for - dysuria, hematuria, incontinence or urinary frequency/urgency Musculoskeletal ROS: negative for - joint swelling or muscular weakness Neurological ROS: as noted in HPI Dermatological ROS: negative for rash and skin lesion changes  Physical Examination: Blood pressure 122/69, pulse 80, temperature 97.9 F (36.6 C), temperature source Oral, resp. rate 18, height 5\' 8"  (1.727 m), weight 118 kg (260 lb 2.3 oz), SpO2 100.00%.  Neurologic Examination  Mental Status: Alert, oriented, thought content appropriate.  Speech fluent without evidence of aphasia.  Able to follow 3 step commands without difficulty. Patient made multiple inappropriate comments towards me during my assessment of his condition. Cranial Nerves: II: visual fields grossly normal, pupils equal, round, reactive to light and accommodation III,IV, VI: ptosis not present, extra-ocular motions intact bilaterally V,VII: smile symmetric, facial light touch sensation normal bilaterally VIII: hearing normal bilaterally IX,X: gag reflex present XI: trapezius strength/neck flexion strength normal bilaterally XII: tongue strength normal  Motor: Right : Upper extremity   5/5    Left:     Upper extremity    5/5  Lower extremity   5/5     Lower extremity   5/5 Tone and bulk:normal tone throughout; no atrophy noted Sensory: Pinprick and light touch intact throughout, bilaterally Deep Tendon Reflexes: 2+ and symmetric throughout Plantars: Right: downgoing   Left: downgoing Cerebellar: normal finger-to-nose, normal rapid alternating movements and normal heel-to-shin test normal gait and station Lungs: Clear to ausculation bilaterally Heart: RRR Ext: no edema, pink, warm  Results for orders placed during the hospital encounter of 06/01/12 (from the past 48 hour(s))  PROTIME-INR     Status: Abnormal   Collection Time    06/01/12  6:16 PM      Result Value Range   Prothrombin Time 18.6 (*) 11.6 - 15.2 seconds   INR 1.61 (*) 0.00 - 1.49  APTT     Status: None   Collection Time    06/01/12  6:16 PM      Result Value Range   aPTT 35  24 - 37 seconds  CBC     Status: Abnormal   Collection Time    06/01/12  6:16 PM      Result Value Range   WBC 4.1  4.0 - 10.5 K/uL   RBC 3.51 (*) 4.22 - 5.81 MIL/uL   Hemoglobin 12.7 (*) 13.0 - 17.0 g/dL   HCT 40.9 (*) 81.1 - 91.4 %   MCV 102.8 (*) 78.0 - 100.0 fL   MCH 36.2 (*) 26.0 - 34.0 pg   MCHC 35.2  30.0 - 36.0 g/dL   RDW 78.2  95.6 - 21.3 %   Platelets 50 (*) 150 - 400 K/uL   Comment: RESULT REPEATED AND VERIFIED     SPECIMEN CHECKED FOR CLOTS     CONSISTENT WITH PREVIOUS RESULT  COMPREHENSIVE METABOLIC PANEL     Status: Abnormal   Collection Time    06/01/12  6:16 PM      Result Value Range  Sodium 128 (*) 135 - 145 mEq/L   Potassium 3.6  3.5 - 5.1 mEq/L   Chloride 94 (*) 96 - 112 mEq/L   CO2 26  19 - 32 mEq/L   Glucose, Bld 110 (*) 70 - 99 mg/dL   BUN 10  6 - 23 mg/dL   Creatinine, Ser 1.61  0.50 - 1.35 mg/dL   Calcium 8.3 (*) 8.4 - 10.5 mg/dL   Total Protein 7.3  6.0 - 8.3 g/dL   Albumin 2.3 (*) 3.5 - 5.2 g/dL   AST 096 (*) 0 - 37 U/L   ALT 61 (*) 0 - 53 U/L   Alkaline Phosphatase 198 (*) 39 - 117 U/L   Total Bilirubin 2.3 (*)  0.3 - 1.2 mg/dL   GFR calc non Af Amer >90  >90 mL/min   GFR calc Af Amer >90  >90 mL/min   Comment:            The eGFR has been calculated     using the CKD EPI equation.     This calculation has not been     validated in all clinical     situations.     eGFR's persistently     <90 mL/min signify     possible Chronic Kidney Disease.  ETHANOL     Status: Abnormal   Collection Time    06/01/12  6:16 PM      Result Value Range   Alcohol, Ethyl (B) 20 (*) 0 - 11 mg/dL   Comment:            LOWEST DETECTABLE LIMIT FOR     SERUM ALCOHOL IS 11 mg/dL     FOR MEDICAL PURPOSES ONLY  URINALYSIS, ROUTINE W REFLEX MICROSCOPIC     Status: None   Collection Time    06/01/12  6:32 PM      Result Value Range   Color, Urine YELLOW  YELLOW   APPearance CLEAR  CLEAR   Specific Gravity, Urine 1.013  1.005 - 1.030   pH 5.5  5.0 - 8.0   Glucose, UA NEGATIVE  NEGATIVE mg/dL   Hgb urine dipstick NEGATIVE  NEGATIVE   Bilirubin Urine NEGATIVE  NEGATIVE   Ketones, ur NEGATIVE  NEGATIVE mg/dL   Protein, ur NEGATIVE  NEGATIVE mg/dL   Urobilinogen, UA 0.2  0.0 - 1.0 mg/dL   Nitrite NEGATIVE  NEGATIVE   Leukocytes, UA NEGATIVE  NEGATIVE   Comment: MICROSCOPIC NOT DONE ON URINES WITH NEGATIVE PROTEIN, BLOOD, LEUKOCYTES, NITRITE, OR GLUCOSE <1000 mg/dL.  URINE RAPID DRUG SCREEN (HOSP PERFORMED)     Status: Abnormal   Collection Time    06/01/12  6:32 PM      Result Value Range   Opiates NONE DETECTED  NONE DETECTED   Cocaine NONE DETECTED  NONE DETECTED   Benzodiazepines POSITIVE (*) NONE DETECTED   Amphetamines NONE DETECTED  NONE DETECTED   Tetrahydrocannabinol NONE DETECTED  NONE DETECTED   Barbiturates NONE DETECTED  NONE DETECTED   Comment:            DRUG SCREEN FOR MEDICAL PURPOSES     ONLY.  IF CONFIRMATION IS NEEDED     FOR ANY PURPOSE, NOTIFY LAB     WITHIN 5 DAYS.                LOWEST DETECTABLE LIMITS     FOR URINE DRUG SCREEN     Drug Class       Cutoff (  ng/mL)     Amphetamine       1000     Barbiturate      200     Benzodiazepine   200     Tricyclics       300     Opiates          300     Cocaine          300     THC              50  AMMONIA     Status: None   Collection Time    06/01/12  9:20 PM      Result Value Range   Ammonia 26  11 - 60 umol/L  CBC     Status: Abnormal   Collection Time    06/02/12  5:10 AM      Result Value Range   WBC 3.9 (*) 4.0 - 10.5 K/uL   Comment: WHITE COUNT CONFIRMED ON SMEAR   RBC 3.27 (*) 4.22 - 5.81 MIL/uL   Hemoglobin 11.9 (*) 13.0 - 17.0 g/dL   HCT 16.1 (*) 09.6 - 04.5 %   MCV 101.5 (*) 78.0 - 100.0 fL   MCH 36.4 (*) 26.0 - 34.0 pg   MCHC 35.8  30.0 - 36.0 g/dL   RDW 40.9  81.1 - 91.4 %   Platelets 36 (*) 150 - 400 K/uL   Comment: SPECIMEN CHECKED FOR CLOTS     DELTA CHECK NOTED     PLATELET COUNT CONFIRMED BY SMEAR  COMPREHENSIVE METABOLIC PANEL     Status: Abnormal   Collection Time    06/02/12  5:10 AM      Result Value Range   Sodium 127 (*) 135 - 145 mEq/L   Potassium 4.3  3.5 - 5.1 mEq/L   Chloride 97  96 - 112 mEq/L   CO2 24  19 - 32 mEq/L   Glucose, Bld 119 (*) 70 - 99 mg/dL   BUN 10  6 - 23 mg/dL   Creatinine, Ser 7.82  0.50 - 1.35 mg/dL   Calcium 8.3 (*) 8.4 - 10.5 mg/dL   Total Protein 6.3  6.0 - 8.3 g/dL   Albumin 2.1 (*) 3.5 - 5.2 g/dL   AST 90 (*) 0 - 37 U/L   ALT 53  0 - 53 U/L   Alkaline Phosphatase 165 (*) 39 - 117 U/L   Total Bilirubin 3.7 (*) 0.3 - 1.2 mg/dL   GFR calc non Af Amer >90  >90 mL/min   GFR calc Af Amer >90  >90 mL/min   Comment:            The eGFR has been calculated     using the CKD EPI equation.     This calculation has not been     validated in all clinical     situations.     eGFR's persistently     <90 mL/min signify     possible Chronic Kidney Disease.  MRSA PCR SCREENING     Status: None   Collection Time    06/02/12  6:30 AM      Result Value Range   MRSA by PCR NEGATIVE  NEGATIVE   Comment:            The GeneXpert MRSA Assay (FDA     approved for  NASAL specimens     only), is one component of a     comprehensive  MRSA colonization     surveillance program. It is not     intended to diagnose MRSA     infection nor to guide or     monitor treatment for     MRSA infections.  VITAMIN B12     Status: Abnormal   Collection Time    06/02/12  1:30 PM      Result Value Range   Vitamin B-12 1418 (*) 211 - 911 pg/mL    Recent Results (from the past 240 hour(s))  MRSA PCR SCREENING     Status: None   Collection Time    05/24/12  2:25 PM      Result Value Range Status   MRSA by PCR NEGATIVE  NEGATIVE Final   Comment:            The GeneXpert MRSA Assay (FDA     approved for NASAL specimens     only), is one component of a     comprehensive MRSA colonization     surveillance program. It is not     intended to diagnose MRSA     infection nor to guide or     monitor treatment for     MRSA infections.  MRSA PCR SCREENING     Status: None   Collection Time    06/02/12  6:30 AM      Result Value Range Status   MRSA by PCR NEGATIVE  NEGATIVE Final   Comment:            The GeneXpert MRSA Assay (FDA     approved for NASAL specimens     only), is one component of a     comprehensive MRSA colonization     surveillance program. It is not     intended to diagnose MRSA     infection nor to guide or     monitor treatment for     MRSA infections.    Ct Head Wo Contrast  06/01/2012  *RADIOLOGY REPORT*  Clinical Data: Sudden onset of dizziness.  Fall.  Head laceration.  CT HEAD WITHOUT CONTRAST  Technique:  Contiguous axial images were obtained from the base of the skull through the vertex without contrast.  Comparison: CT head without contrast 05/23/2012.  Findings: No acute cortical infarct, hemorrhage, or mass lesion is present.  Mild generalized atrophy is stable.  A high left parietal scalp laceration is noted.  There is no underlying fracture.  The paranasal sinuses are clear.  Minimal fluid in the inferior left mastoid air cells is  stable.  The mastoid air cells are otherwise clear bilaterally.  IMPRESSION:  1.  Stable mild atrophy. 2.  No acute intracranial abnormality.   Original Report Authenticated By: Marin Roberts, M.D.      Assessment/Plan:  57yo male with significant medical problems including liver failure in setting of alcohol abuse, requiring lactulose for confusion. He also has reported history of seizure with post-ictal confusion. Overall concern for complaince. At this time would not change course of treatment. If seizure occurs with this dose would increase to Keppra 1000mg  twice daily.  Guy Franco PA-C, MBA, MHA [facility] Pager [phone]  Oh percent despite this patient's evaluation and management and improved the above clinical assessment and management recommendations.  Venetia Maxon M.D. [facility] [phone]  06/02/2012, 4:56 PM

## 2012-06-02 NOTE — Evaluation (Signed)
Physical Therapy Evaluation Patient Details Name: Tony Boyd MRN: [id number] DOB: May 08, 1955 Today's Date: 06/02/2012 Time: 1478-2956 PT Time Calculation (min): 25 min  PT Assessment / Plan / Recommendation Clinical Impression  Pt admitted for syncope episode and seizures.  Pt would benefit from acute PT services in order to improve mobility and safety with assistive device while in acute care to prepare for d/c home.  Pt reports he lives alone and is close to baseline however required multiple cues for safety with RW.  Pt also reports daughter brings his meds each day.      PT Assessment  Patient needs continued PT services    Follow Up Recommendations  Home health PT    Does the patient have the potential to tolerate intense rehabilitation      Barriers to Discharge        Equipment Recommendations  None recommended by PT    Recommendations for Other Services     Frequency Min 3X/week    Precautions / Restrictions Precautions Precautions: Fall Precaution Comments: seizures   Pertinent Vitals/Pain Denies SOB and dizziness with activity, reports back pain however states he is not due for pain meds yet.      Mobility  Bed Mobility Bed Mobility: Supine to Sit;Sit to Supine Supine to Sit: 6: Modified independent (Device/Increase time);HOB elevated Sit to Supine: 6: Modified independent (Device/Increase time);HOB elevated Transfers Transfers: Sit to Stand;Stand to Sit Sit to Stand: 5: Supervision;From toilet;From bed Stand to Sit: 5: Supervision;To toilet;To bed Details for Transfer Assistance: verbal cues for safety with RW, cues for safety as pt lifted RW to get around trash can in bathroom and ended up putting back leg of RW in trash can Ambulation/Gait Ambulation/Gait Assistance: 4: Min guard;5: Supervision Ambulation Distance (Feet): 400 Feet Assistive device: Rolling walker Ambulation/Gait Assistance Details: no unsteady gait, denies SOB or dizziness Gait  Pattern: Trunk flexed;Step-through pattern;Decreased hip/knee flexion - right;Decreased hip/knee flexion - left    Exercises     PT Diagnosis: Difficulty walking  PT Problem List: Decreased strength;Decreased activity tolerance;Decreased mobility;Decreased knowledge of use of DME;Decreased safety awareness PT Treatment Interventions: DME instruction;Gait training;Functional mobility training;Therapeutic activities;Therapeutic exercise;Patient/family education   PT Goals Acute Rehab PT Goals PT Goal Formulation: With patient Time For Goal Achievement: 06/09/12 Potential to Achieve Goals: Good Pt will go Sit to Stand: with modified independence PT Goal: Sit to Stand - Progress: Goal set today Pt will go Stand to Sit: with modified independence PT Goal: Stand to Sit - Progress: Goal set today Pt will Ambulate: >150 feet;with modified independence;with least restrictive assistive device PT Goal: Ambulate - Progress: Goal set today Pt will Perform Home Exercise Program: with supervision, verbal cues required/provided PT Goal: Perform Home Exercise Program - Progress: Goal set today  Visit Information  Last PT Received On: 06/02/12 Assistance Needed: +1    Subjective Data  Subjective: I was at my friends, got up to leave, and just passed out.   Patient Stated Goal: home   Prior Functioning  Home Living Lives With: Alone Type of Home: Apartment Home Access: Elevator Home Layout: One level Home Adaptive Equipment: Bedside commode/3-in-1;Walker - four wheeled;Straight cane Additional Comments: Pt states his daughter brings his meds every day. Prior Function Level of Independence: Independent with assistive device(s) Comments: Pt states he uses nothing in home (but walls and furniture if needed), SPC or RW in community Communication Communication: No difficulties    Cognition  Cognition Overall Cognitive Status: Appears within functional limits for tasks  assessed/performed Arousal/Alertness: Awake/alert Orientation Level: Appears intact for tasks assessed Behavior During Session: [facility] for tasks performed    Extremity/Trunk Assessment     Balance    End of Session PT - End of Session Equipment Utilized During Treatment: Gait belt Activity Tolerance: Patient tolerated treatment well Patient left: in bed;with call bell/phone within reach;with bed alarm set  GP Functional Assessment Tool Used: clinical observation Functional Limitation: Mobility: Walking and moving around Mobility: Walking and Moving Around Current Status ([id number]): At least 1 percent but less than 20 percent impaired, limited or restricted Mobility: Walking and Moving Around Goal Status [phone]): 0 percent impaired, limited or restricted   Mckay Brandt,KATHrine E 06/02/2012, 3:55 PM Zenovia Jarred, PT, DPT 06/02/2012 Pager: [phone]

## 2012-06-02 NOTE — Progress Notes (Signed)
Subjective: The patient is sitting up in bed, alert. He has a mild right temporal headache, but denies dizziness. Overall, he says he feels a little weak.  Objective: Vital signs in last 24 hours: Filed Vitals:   06/02/12 0704 06/02/12 1018 06/02/12 1019 06/02/12 1020  BP: 106/72 138/62 124/77 145/74  Pulse: 77 94 89 90  Temp: 98.1 F (36.7 C)     TempSrc: Oral     Resp: 18 20    Height:      Weight:      SpO2: 100% 98% 100% 100%    Intake/Output Summary (Last 24 hours) at 06/02/12 1108 Last data filed at 06/02/12 1610  Gross per 24 hour  Intake    240 ml  Output    700 ml  Net   -460 ml    Weight change:   Physical exam: General: Obese 57 year old Caucasian man, alert, sitting up in bed, in no acute distress. Lungs: Clear anteriorly with decreased breath sounds in the bases. Heart: S1, S2, with no murmurs rubs or gallops. Abdomen: Obese, positive bowel sounds, query mild ascites, nontender, nondistended. Sign extremities: Trace of pedal edema bilaterally. Neurologic: He is alert and oriented x3. Cranial nerves II through XII are intact. No asterixis.  Lab Results: Basic Metabolic Panel:  Recent Labs  96/04/54 1816 06/02/12 0510  NA 128* 127*  K 3.6 4.3  CL 94* 97  CO2 26 24  GLUCOSE 110* 119*  BUN 10 10  CREATININE 0.89 0.69  CALCIUM 8.3* 8.3*   Liver Function Tests:  Recent Labs  06/01/12 1816 06/02/12 0510  AST 100* 90*  ALT 61* 53  ALKPHOS 198* 165*  BILITOT 2.3* 3.7*  PROT 7.3 6.3  ALBUMIN 2.3* 2.1*   No results found for this basename: LIPASE, AMYLASE,  in the last 72 hours  Recent Labs  06/01/12 2120  AMMONIA 26   CBC:  Recent Labs  06/01/12 1816 06/02/12 0510  WBC 4.1 3.9*  HGB 12.7* 11.9*  HCT 36.1* 33.2*  MCV 102.8* 101.5*  PLT 50* 36*   Cardiac Enzymes: No results found for this basename: CKTOTAL, CKMB, CKMBINDEX, TROPONINI,  in the last 72 hours BNP: No results found for this basename: PROBNP,  in the last 72  hours D-Dimer: No results found for this basename: DDIMER,  in the last 72 hours CBG: No results found for this basename: GLUCAP,  in the last 72 hours Hemoglobin A1C: No results found for this basename: HGBA1C,  in the last 72 hours Fasting Lipid Panel: No results found for this basename: CHOL, HDL, LDLCALC, TRIG, CHOLHDL, LDLDIRECT,  in the last 72 hours Thyroid Function Tests: No results found for this basename: TSH, T4TOTAL, FREET4, T3FREE, THYROIDAB,  in the last 72 hours Anemia Panel: No results found for this basename: VITAMINB12, FOLATE, FERRITIN, TIBC, IRON, RETICCTPCT,  in the last 72 hours Coagulation:  Recent Labs  06/01/12 1816  LABPROT 18.6*  INR 1.61*   Urine Drug Screen: Drugs of Abuse     Component Value Date/Time   LABOPIA NONE DETECTED 06/01/2012 1832   LABOPIA  Value: POSITIVE (NOTE) Result repeated and verified. Sent for confirmatory testing* 10/31/2007 0600   COCAINSCRNUR NONE DETECTED 06/01/2012 1832   COCAINSCRNUR NEGATIVE 10/31/2007 0600   LABBENZ POSITIVE* 06/01/2012 1832   LABBENZ  Value: POSITIVE (NOTE) Result repeated and verified. Sent for confirmatory testing* 10/31/2007 0600   AMPHETMU NONE DETECTED 06/01/2012 1832   AMPHETMU NEGATIVE 10/31/2007 0600   THCU NONE DETECTED 06/01/2012  1832   LABBARB NONE DETECTED 06/01/2012 1832    Alcohol Level:  Recent Labs  06/01/12 1816  ETH 20*   Urinalysis:  Recent Labs  06/01/12 1832  COLORURINE YELLOW  LABSPEC 1.013  PHURINE 5.5  GLUCOSEU NEGATIVE  HGBUR NEGATIVE  BILIRUBINUR NEGATIVE  KETONESUR NEGATIVE  PROTEINUR NEGATIVE  UROBILINOGEN 0.2  NITRITE NEGATIVE  LEUKOCYTESUR NEGATIVE   Misc. Labs:   Micro: Recent Results (from the past 240 hour(s))  MRSA PCR SCREENING     Status: None   Collection Time    05/24/12  2:25 PM      Result Value Range Status   MRSA by PCR NEGATIVE  NEGATIVE Final   Comment:            The GeneXpert MRSA Assay (FDA     approved for NASAL specimens     only), is one  component of a     comprehensive MRSA colonization     surveillance program. It is not     intended to diagnose MRSA     infection nor to guide or     monitor treatment for     MRSA infections.  MRSA PCR SCREENING     Status: None   Collection Time    06/02/12  6:30 AM      Result Value Range Status   MRSA by PCR NEGATIVE  NEGATIVE Final   Comment:            The GeneXpert MRSA Assay (FDA     approved for NASAL specimens     only), is one component of a     comprehensive MRSA colonization     surveillance program. It is not     intended to diagnose MRSA     infection nor to guide or     monitor treatment for     MRSA infections.    Studies/Results: Ct Head Wo Contrast  06/01/2012  *RADIOLOGY REPORT*  Clinical Data: Sudden onset of dizziness.  Fall.  Head laceration.  CT HEAD WITHOUT CONTRAST  Technique:  Contiguous axial images were obtained from the base of the skull through the vertex without contrast.  Comparison: CT head without contrast 05/23/2012.  Findings: No acute cortical infarct, hemorrhage, or mass lesion is present.  Mild generalized atrophy is stable.  A high left parietal scalp laceration is noted.  There is no underlying fracture.  The paranasal sinuses are clear.  Minimal fluid in the inferior left mastoid air cells is stable.  The mastoid air cells are otherwise clear bilaterally.  IMPRESSION:  1.  Stable mild atrophy. 2.  No acute intracranial abnormality.   Original Report Authenticated By: Marin Roberts, M.D.     Medications:  Scheduled: . ferrous sulfate  325 mg Oral QHS  . folic acid  1 mg Oral Daily  . furosemide  80 mg Oral BID  . lactulose  30 g Oral 5 X Daily  . levETIRAcetam  500 mg Oral TID  . levothyroxine  75 mcg Oral QAC breakfast  . LORazepam  1 mg Intravenous Once  . multivitamin with minerals  1 tablet Oral Daily  . pantoprazole  40 mg Oral Daily  . potassium chloride  10 mEq Oral BID  . rifaximin  550 mg Oral BID  . risperiDONE  0.25  mg Oral BID  . sodium chloride  3 mL Intravenous Q12H  . spironolactone  200 mg Oral Daily   Continuous:  ZOX:WRUEAV chloride, albuterol, ALPRAZolam, oxyCODONE, sodium  chloride  Assessment: Principal Problem:   Seizure Active Problems:   Syncope and collapse   ETOH abuse   Coagulopathy   Hyponatremia   Cirrhosis   Esophageal varices without mention of bleeding   Seizure disorder   Thrombocytopenia, acquired   Macrocytic anemia   Unspecified hypothyroidism   1. Syncope and collapse. Likely secondary to recurrent seizures. CT of the head reveals no acute disease. Currently, he is neurologically intact with no cranial nerve or peripheral deficits.  Seizure disorder. He is on Keppra 3 times a day. He said he's been taking it as prescribed, but compliance is likely an issue given his history of alcohol abuse. Neurology has apparently been consulted.  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 We'll continue chronic medications including Aldactone, Lasix, rifaximin, lactulose.  Hyponatremia. This is chronic and secondary to cirrhosis. Will continue to monitor.  Leukopenia/thrombocytopenia, and macrocytic anemia. All secondary to cirrhosis and hepatitis C and alcohol abuse. All of his counts are more or less stable compared to the previous hospitalization, except his platelet count is down approximately 10-15,000. No obvious bleeding sequelae. He is on Protonix.   Alcohol abuse. He reported recently stopping drinking. He has no signs of alcohol withdrawal syndrome. We'll continue vitamin therapy.  Hypothyroidism. We'll continue Synthroid. His TSH was within normal limits in February 2014.  Plan:  1. Neurology consulted per admission note, will await their evaluation and recommendations. 2. Will order orthostatic vital signs. 3. We'll order a vitamin B12 level. 4. We'll monitor his blood counts and electrolytes.   LOS: 1 day    Tony Boyd 06/02/2012, 11:08 AM

## 2012-06-02 NOTE — Progress Notes (Signed)
Spoke with Dr Sherrie Mustache ok for  Pt to come off tele for for upgrade.

## 2012-06-03 ENCOUNTER — Other Ambulatory Visit: Payer: Self-pay | Admitting: *Deleted

## 2012-06-03 DIAGNOSIS — D6959 Other secondary thrombocytopenia: Secondary | ICD-10-CM

## 2012-06-03 DIAGNOSIS — R55 Syncope and collapse: Secondary | ICD-10-CM

## 2012-06-03 DIAGNOSIS — D539 Nutritional anemia, unspecified: Secondary | ICD-10-CM

## 2012-06-03 DIAGNOSIS — D689 Coagulation defect, unspecified: Secondary | ICD-10-CM

## 2012-06-03 LAB — COMPREHENSIVE METABOLIC PANEL
ALT: 49 U/L (ref 0–53)
Alkaline Phosphatase: 165 U/L — ABNORMAL HIGH (ref 39–117)
Chloride: 93 mEq/L — ABNORMAL LOW (ref 96–112)
GFR calc Af Amer: >90 mL/min (ref >90)
Glucose, Bld: 173 mg/dL — ABNORMAL HIGH (ref 70–99)
Potassium: 3.7 mEq/L (ref 3.5–5.1)
Sodium: 127 mEq/L — ABNORMAL LOW (ref 135–145)
Total Protein: 6.3 g/dL (ref 6.0–8.3)

## 2012-06-03 LAB — CBC
HCT: 31.9 % — ABNORMAL LOW (ref 39.0–52.0)
MCHC: 36.1 g/dL — ABNORMAL HIGH (ref 30.0–36.0)
MCV: 101.9 fL — ABNORMAL HIGH (ref 78.0–100.0)
RDW: 14.8 % (ref 11.5–15.5)

## 2012-06-03 MED ORDER — LEVETIRACETAM 500 MG PO TABS: 500.0000 mg | ORAL_TABLET | Freq: Three times a day (TID) | ORAL | Status: DC

## 2012-06-03 MED ORDER — OXYCODONE HCL 5 MG PO TABS: ORAL_TABLET | ORAL | Status: DC

## 2012-06-03 NOTE — Discharge Summary (Signed)
Physician Discharge Summary  Tony Boyd JXB:[id number] DOB: 07-09-1955 DOA: 06/01/2012  PCP: [name], DO  Admit date: 06/01/2012 Discharge date: 06/03/2012  Time spent: Greater than 30 minutes  Recommendations for Outpatient Follow-up:  1. The patient was advised to followup with his primary care physician as scheduled.  Discharge Diagnoses:  Principal Problem:   Seizure Active Problems:   Syncope and collapse   ETOH abuse   Coagulopathy   Hyponatremia   Cirrhosis   Esophageal varices without mention of bleeding   Seizure disorder   Thrombocytopenia, acquired   Macrocytic anemia   Unspecified hypothyroidism   Posterior scalp laceration, s/p staples applied in the ED.   Discharge Condition: Improved and stable.  Diet recommendation: Heart healthy.  Filed Weights   06/02/12 0332  Weight: 118 kg (260 lb 2.3 oz)    History of present illness:   Tony Boyd is a 57 y.o. Male who was just discharged from our service on 05/25/2012 for hepatic encephalopathy (EtOH cirrhosis), who presented on 06/01/2012 after a witnessed syncopal episode with a fall while with his friends. The patient apparently got up to leave, felt weak and dizzy, and then blacked out.  As a result he was brought to the ED. While in the ED,  2 witnessed seizure episodes occurred. He was admitted for further evaluation and management.   Hospital Course:   1. Syncope and collapse. In the ED, he was afebrile and hemodynamically stable.  CT of his head revealed no acute disease or fracture. His ammonia level was within normal limits. His UDS was positive for benzodiazepines which he takes. His alcohol level was 20, but he denied drinking any alcohol since the last discharge. The patient became alert and oriented after being post-ictal. He remained alert and oriented during the entire hospital course. The syncope was thought to be secondary to seizures. There were no cranial nerve or peripheral deficits.   2.  Seizure disorder. He was on Keppra 500 mg two times a day at home. He said he had been taking it as prescribed, but compliance is likely an issue given his history of alcohol abuse. The dose was increased to 500 mg on admission. Neurology was consulted. He recommended increasing the dose of Keppra to 1000 mg three times daily if the patient continued to have recurrent seizures. There were no seizures during the hospital course.  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 He was continued on all of his chronic medications including Aldactone, Lasix, rifaximin, lactulose.   4.Hyponatremia. This is chronic and secondary to cirrhosis.   5.Leukopenia/thrombocytopenia, and macrocytic anemia. All are secondary to cirrhosis and hepatitis C and alcohol abuse. There was no obvious bleeding sequelae. He was maintained on PPI therapy.   6. Alcohol abuse. He reported recently stopping drinking. He had no signs of alcohol withdrawal syndrome.   7.Hypothyroidism.  Synthroid was continued. His TSH was within normal limits in February 2014.    Procedures:  None  Consultations:  Neurologist, Dr. Noel Christmas  Discharge Exam: Filed Vitals:   06/02/12 1020 06/02/12 1342 06/02/12 2112 06/03/12 0518  BP: 145/74 122/69 123/70 103/63  Pulse: 90 80 69 69  Temp:  97.9 F (36.6 C) 97.9 F (36.6 C) 97.4 F (36.3 C)  TempSrc:  Oral Oral Oral  Resp:  18 18 18   Height:      Weight:      SpO2: 100% 100% 96% 95%    General: Obese alert 57 year old Caucasian man sitting  up in the bed eating lunch. No acute distress. Cardiovascular: S1, S2, with a soft systolic murmur. Respiratory: Clear anteriorly. Neurologic: He is alert and oriented x3. Cranial nerves II through XII are intact.  Discharge Instructions  Discharge Orders   Future Appointments Provider Department Dept Phone   06/09/2012 8:45 AM [facility] [phone]   06/09/2012 2:45 PM  Louis Meckel, MD [facility] [phone]   Future Orders Complete By Expires     Diet general  As directed     Discharge instructions  As directed     Comments:      Do not drink alcohol.    Increase activity slowly  As directed         Medication List    TAKE these medications       albuterol 108 (90 BASE) MCG/ACT inhaler  Commonly known as:  PROVENTIL HFA;VENTOLIN HFA  Inhale 2 puffs into the lungs every 6 (six) hours as needed for wheezing.     ALPRAZolam 1 MG tablet  Commonly known as:  XANAX  Take 1 tablet (1 mg total) by mouth 2 (two) times daily as needed for sleep or anxiety. For anxiety.     ferrous sulfate 325 (65 FE) MG tablet  Take 325 mg by mouth at bedtime.     folic acid 1 MG tablet  Commonly known as:  FOLVITE  Take 1 mg by mouth daily.     furosemide 80 MG tablet  Commonly known as:  LASIX  Take 1 tablet (80 mg total) by mouth 2 (two) times daily.     lactulose 10 GM/15ML solution  Commonly known as:  CHRONULAC  Take 30 g by mouth 5 (five) times daily.     levETIRAcetam 500 MG tablet  Commonly known as:  KEPPRA  Take 1 tablet (500 mg total) by mouth 3 (three) times daily.     levothyroxine 75 MCG tablet  Commonly known as:  SYNTHROID, LEVOTHROID  Take 75 mcg by mouth daily.     multivitamin with minerals Tabs  Take 1 tablet by mouth daily.     omeprazole 20 MG capsule  Commonly known as:  PRILOSEC  Take 20 mg by mouth daily.     oxyCODONE 5 MG immediate release tablet  Commonly known as:  Oxy IR/ROXICODONE  Take two tablets every 4 hours as needed for pain.     potassium chloride 10 MEQ tablet  Commonly known as:  K-DUR,KLOR-CON  Take 1 tablet (10 mEq total) by mouth 2 (two) times daily.     rifaximin 550 MG Tabs  Commonly known as:  XIFAXAN  Take 550 mg by mouth 2 (two) times daily.     risperiDONE 0.25 MG tablet  Commonly known as:  RISPERDAL  Take 1 tablet (0.25 mg total) by mouth 2 (two) times daily.      spironolactone 100 MG tablet  Commonly known as:  ALDACTONE  Take 200 mg by mouth daily.           Follow-up Information   Follow up with REED, TIFFANY, DO. (Follow up as scheduled. )    Contact information:   [address] [place] [address] [phone]        The results of significant diagnostics from this hospitalization (including imaging, microbiology, ancillary and laboratory) are listed below for reference.    Significant Diagnostic Studies: Ct Head Wo Contrast  06/01/2012  *RADIOLOGY REPORT*  Clinical Data: Sudden  onset of dizziness.  Fall.  Head laceration.  CT HEAD WITHOUT CONTRAST  Technique:  Contiguous axial images were obtained from the base of the skull through the vertex without contrast.  Comparison: CT head without contrast 05/23/2012.  Findings: No acute cortical infarct, hemorrhage, or mass lesion is present.  Mild generalized atrophy is stable.  A high left parietal scalp laceration is noted.  There is no underlying fracture.  The paranasal sinuses are clear.  Minimal fluid in the inferior left mastoid air cells is stable.  The mastoid air cells are otherwise clear bilaterally.  IMPRESSION:  1.  Stable mild atrophy. 2.  No acute intracranial abnormality.   Original Report Authenticated By: Marin Roberts, M.D.    Ct Head Wo Contrast  05/23/2012  *RADIOLOGY REPORT*  Clinical Data: Unsteady gait.  Dizziness.  Cirrhosis.  CT HEAD WITHOUT CONTRAST  Technique:  Contiguous axial images were obtained from the base of the skull through the vertex without contrast.  Comparison: CT 04/16/2012  Findings: Mild atrophy, unchanged.  Negative for hydrocephalus. Negative for acute infarct or mass.  No intracranial hemorrhage is present.  Calvarium intact.  IMPRESSION: Mild atrophy.  No acute abnormality.   Original Report Authenticated By: Janeece Riggers, M.D.     Microbiology: Recent Results (from the past 240 hour(s))  MRSA PCR SCREENING     Status: None   Collection  Time    06/02/12  6:30 AM      Result Value Range Status   MRSA by PCR NEGATIVE  NEGATIVE Final   Comment:            The GeneXpert MRSA Assay (FDA     approved for NASAL specimens     only), is one component of a     comprehensive MRSA colonization     surveillance program. It is not     intended to diagnose MRSA     infection nor to guide or     monitor treatment for     MRSA infections.     Labs: Basic Metabolic Panel:  Recent Labs Lab 06/01/12 1816 06/02/12 0510 06/03/12 0530  NA 128* 127* 127*  K 3.6 4.3 3.7  CL 94* 97 93*  CO2 26 24 28   GLUCOSE 110* 119* 173*  BUN 10 10 11   CREATININE 0.89 0.69 0.79  CALCIUM 8.3* 8.3* 8.0*   Liver Function Tests:  Recent Labs Lab 06/01/12 1816 06/02/12 0510 06/03/12 0530  AST 100* 90* 77*  ALT 61* 53 49  ALKPHOS 198* 165* 165*  BILITOT 2.3* 3.7* 2.1*  PROT 7.3 6.3 6.3  ALBUMIN 2.3* 2.1* 2.0*   No results found for this basename: LIPASE, AMYLASE,  in the last 168 hours  Recent Labs Lab 06/01/12 2120  AMMONIA 26   CBC:  Recent Labs Lab 06/01/12 1816 06/02/12 0510 06/03/12 0530  WBC 4.1 3.9* 3.0*  HGB 12.7* 11.9* 11.5*  HCT 36.1* 33.2* 31.9*  MCV 102.8* 101.5* 101.9*  PLT 50* 36* 31*   Cardiac Enzymes: No results found for this basename: CKTOTAL, CKMB, CKMBINDEX, TROPONINI,  in the last 168 hours BNP: BNP (last 3 results) No results found for this basename: PROBNP,  in the last 8760 hours CBG: No results found for this basename: GLUCAP,  in the last 168 hours     Signed:  Debra Calabretta  [facility] 06/03/2012, 7:09 PM

## 2012-06-03 NOTE — Progress Notes (Signed)
Patient discharge home with daughter, alert and oriented, discharge instructions given, patient and daughter verbalize understanding of orders given, MyChart refused at this time, patient stated,' my daughter and me will do it later,' instructions given on MyChart, patient in stable condition at this time

## 2012-06-03 NOTE — Progress Notes (Signed)
NEURO HOSPITALIST PROGRESS NOTE   SUBJECTIVE:                                                                                                                         No complaints.  No further seizures or episodes of AMS OBJECTIVE:                                                                                                                           Vital signs in last 24 hours: Temp:  [97.4 F (36.3 C)-97.9 F (36.6 C)] 97.4 F (36.3 C) (04/08 0518) Pulse Rate:  [69-94] 69 (04/08 0518) Resp:  [18-20] 18 (04/08 0518) BP: (103-145)/(62-77) 103/63 mmHg (04/08 0518) SpO2:  [95 %-100 %] 95 % (04/08 0518)  Intake/Output from previous day: 04/07 0701 - 04/08 0700 In: 960 [P.O.:960] Out: 1001 [Urine:1001] Intake/Output this shift:   Nutritional status: Cardiac  Past Medical History  Diagnosis Date  . Cirrhosis of liver   . Hepatitis C   . ETOH abuse   . Hypothyroid   . Psychosis   . Bipolar disorder   . Seizures   . Arthritis   . Back pain   . Coagulopathy     Hx of  . Thrombocytopenia     Hx of  . Pancytopenia     Hx of  . H/O hypokalemia   . Hyponatremia     Hx of  . Korsakoff psychosis   . H/O abdominal abscess   . H/O esophageal varices   . Metabolic encephalopathy   . Hepatic encephalopathy   . Urinary urgency   . H/O renal failure   . H/O ascites   . Altered mental status   . Anemia   . Ascites   . Shortness of breath   . Peripheral vascular disease   . GERD (gastroesophageal reflux disease)   . Thrombocytopenia, acquired 06/02/2012  . Unspecified hypothyroidism 06/02/2012     Neurologic Exam:  Mental Status: Alert, oriented, thought content appropriate.  Speech fluent without evidence of aphasia.  Able to follow 3 step commands without difficulty. Cranial Nerves: II: Discs flat bilaterally; Visual fields grossly normal, pupils equal, round, reactive to light and accommodation III,IV, VI: ptosis not present,  extra-ocular motions intact bilaterally V,VII: smile symmetric, facial light touch sensation normal bilaterally VIII: hearing normal bilaterally IX,X: gag reflex present XI: bilateral shoulder shrug XII: midline tongue extension Motor: Right : Upper extremity   5/5    Left:     Upper extremity   5/5  Lower extremity   5/5     Lower extremity   5/5 Tone and bulk:normal tone throughout; no atrophy noted Sensory: Pinprick and light touch intact throughout, bilaterally Deep Tendon Reflexes: 2+ and symmetric throughout Plantars: Right: downgoing   Left: downgoing Cerebellar: normal finger-to-nose,  normal heel-to-shin test CV: pulses palpable throughout    Lab Results: Lab Results  Component Value Date/Time   CHOL 96 09/14/2010  4:33 PM   Lipid Panel No results found for this basename: CHOL, TRIG, HDL, CHOLHDL, VLDL, LDLCALC,  in the last 72 hours  Studies/Results: Ct Head Wo Contrast  06/01/2012  *RADIOLOGY REPORT*  Clinical Data: Sudden onset of dizziness.  Fall.  Head laceration.  CT HEAD WITHOUT CONTRAST  Technique:  Contiguous axial images were obtained from the base of the skull through the vertex without contrast.  Comparison: CT head without contrast 05/23/2012.  Findings: No acute cortical infarct, hemorrhage, or mass lesion is present.  Mild generalized atrophy is stable.  A high left parietal scalp laceration is noted.  There is no underlying fracture.  The paranasal sinuses are clear.  Minimal fluid in the inferior left mastoid air cells is stable.  The mastoid air cells are otherwise clear bilaterally.  IMPRESSION:  1.  Stable mild atrophy. 2.  No acute intracranial abnormality.   Original Report Authenticated By: Marin Roberts, M.D.     MEDICATIONS                                                                                                                        Scheduled: . ferrous sulfate  325 mg Oral QHS  . folic acid  1 mg Oral Daily  . furosemide  80 mg Oral  BID  . lactulose  30 g Oral 5 X Daily  . levETIRAcetam  500 mg Oral TID  . levothyroxine  75 mcg Oral QAC breakfast  . LORazepam  1 mg Intravenous Once  . multivitamin with minerals  1 tablet Oral Daily  . pantoprazole  40 mg Oral Daily  . potassium chloride  10 mEq Oral BID  . rifaximin  550 mg Oral BID  . risperiDONE  0.25 mg Oral BID  . sodium chloride  3 mL Intravenous Q12H  . spironolactone  200 mg Oral Daily  . thiamine  100 mg Oral Daily    ASSESSMENT/PLAN:  Seizure: Patient is back to his baseline, no further seizures over night.  Neurological exam is non-focal. At this time would recommend keeping patient on current dose of Keppra. Again, If seizure occurs with this dose would increase to Keppra 1000mg  twice daily.  Neurology will S/O  Assessment and plan discussed with with attending physician and they are in agreement.    Felicie Morn PA-C [facility] [phone]  06/03/2012, 8:56 AM

## 2012-06-04 NOTE — ED Provider Notes (Signed)
Medical screening examination/treatment/procedure(s) were performed by non-physician practitioner and as supervising physician I was immediately available for consultation/collaboration.   Nelia Shi, MD 06/04/12 [phone]

## 2012-06-06 ENCOUNTER — Encounter: Payer: Self-pay | Admitting: Geriatric Medicine

## 2012-06-09 ENCOUNTER — Encounter: Payer: Self-pay | Admitting: Internal Medicine

## 2012-06-09 ENCOUNTER — Ambulatory Visit: Payer: Medicare Other | Admitting: Gastroenterology

## 2012-06-09 ENCOUNTER — Telehealth: Payer: Self-pay | Admitting: Gastroenterology

## 2012-06-09 ENCOUNTER — Ambulatory Visit (INDEPENDENT_AMBULATORY_CARE_PROVIDER_SITE_OTHER): Payer: Medicare Other | Admitting: Internal Medicine

## 2012-06-09 VITALS — BP 128/82 | HR 84 | Temp 98.1°F | Resp 16 | Ht 68.0 in | Wt 275.8 lb

## 2012-06-09 DIAGNOSIS — D6959 Other secondary thrombocytopenia: Secondary | ICD-10-CM

## 2012-06-09 DIAGNOSIS — K7290 Hepatic failure, unspecified without coma: Secondary | ICD-10-CM

## 2012-06-09 DIAGNOSIS — M545 Low back pain, unspecified: Secondary | ICD-10-CM

## 2012-06-09 DIAGNOSIS — E039 Hypothyroidism, unspecified: Secondary | ICD-10-CM

## 2012-06-09 DIAGNOSIS — D696 Thrombocytopenia, unspecified: Secondary | ICD-10-CM

## 2012-06-09 DIAGNOSIS — F1010 Alcohol abuse, uncomplicated: Secondary | ICD-10-CM

## 2012-06-09 DIAGNOSIS — R188 Other ascites: Secondary | ICD-10-CM

## 2012-06-09 DIAGNOSIS — K7682 Hepatic encephalopathy: Secondary | ICD-10-CM

## 2012-06-09 DIAGNOSIS — G40909 Epilepsy, unspecified, not intractable, without status epilepticus: Secondary | ICD-10-CM

## 2012-06-09 MED ORDER — OXYCODONE HCL 10 MG PO TABS: 10.0000 mg | ORAL_TABLET | ORAL | Status: DC | PRN

## 2012-06-09 NOTE — Assessment & Plan Note (Signed)
TSH in February was good.  Continue to monitor.

## 2012-06-09 NOTE — Assessment & Plan Note (Signed)
Says last drink was 3 weeks ago--12oz bud light.  Has friend who keeps him off the alcohol and daughter also looks after him.

## 2012-06-09 NOTE — Assessment & Plan Note (Signed)
Large abdominal ascites.  No abdominal pain.

## 2012-06-09 NOTE — Assessment & Plan Note (Signed)
Chronic.  Due to alcoholic cirrhosis.  Has easy bruising and bleeding.

## 2012-06-09 NOTE — Telephone Encounter (Signed)
No charge. 

## 2012-06-09 NOTE — Progress Notes (Signed)
Patient ID: Tony Boyd, male   DOB: [birth year], 57 y.o.   MRN: [id number] Code Status: full, need to discuss in more detail with him and his daughter, caregiver  Allergies  Allergen Reactions  . Droperidol Other (See Comments)    unknown  . Ketorolac Other (See Comments)    unknown  . Penicillins Other (See Comments)    unkown  . Toradol (Ketorolac Tromethamine)     Chief Complaint  Patient presents with  . Annual Exam    HPI: Patient is a 57 y.o. Caucasian male seen in the office today for hospital f/u and annual physical.  He is accompanied by his daughter.  Was told he probably had a seizure.  He was leaving a friend, and fell into doorframe and then hit door jam.  Got 3 stitches in back of his head.  Had not had seizures for 2 years.  Daughter's husband died suddenly of heart attack at 62 last week.  Had another seizure just before leaving hospital.  Keppra was increased.  Has been feeling better since.  Went to [place] over the weekend.    Daughter had called ems previous time b/c he was more confused, but turned out he was drunk from drinking friend's homemade wine.    Needs 5 lactulose regularly.    Daughter tries to give him only microwaveable things.  Recently left stove on and grilled cheese burning--fell asleep he says.      Did have EGD in February and has f/u with Dr. Christella Hartigan this afternoon.    Saw ortho for back--Dr. Ethelene Hal.  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  Will require therapy, as well.    Review of Systems:  Review of Systems  Constitutional: Negative for fever and chills.  Eyes: Negative for blurred vision.  Respiratory: Negative for shortness of breath.   Cardiovascular: Negative for chest pain.  Gastrointestinal: Positive for diarrhea. Negative for nausea, vomiting and constipation.  Genitourinary: Positive for frequency.  Musculoskeletal: Positive for back pain and falls.  Skin: Negative for rash.   Neurological: Positive for seizures and loss of consciousness. Negative for dizziness, focal weakness, weakness and headaches.  Endo/Heme/Allergies: Bruises/bleeds easily.  Psychiatric/Behavioral: Positive for memory loss.       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 Cirrhosis of liver   . Hepatitis C   . ETOH abuse   . Hypothyroid   . Psychosis   . Bipolar disorder   . Seizures   . Arthritis   . Back pain   . Coagulopathy     Hx of  . Thrombocytopenia     Hx of  . Pancytopenia     Hx of  . H/O hypokalemia   . Hyponatremia     Hx of  . Korsakoff psychosis   . H/O abdominal abscess   . H/O esophageal varices   . Metabolic encephalopathy   . Hepatic encephalopathy   . Urinary urgency   . H/O renal failure   . H/O ascites   . Altered mental status   . Anemia   . Ascites   . Shortness of breath   . Peripheral vascular disease   . GERD (gastroesophageal reflux disease)   . Thrombocytopenia, acquired 06/02/2012  . Unspecified hypothyroidism 06/02/2012  . Pain in joint, pelvic region and thigh   . Edema   . Obesity, unspecified   . Chest pain, unspecified   .  Pneumonitis due to inhalation of food or vomitus   . Other convulsions   . Cellulitis and abscess of upper arm and forearm   . Chronic hepatitis C without mention of hepatic coma   . Hypopotassemia   . Anemia, unspecified   . Other and unspecified coagulation defects   . Thrombocytopenia, unspecified   . Bipolar I disorder, most recent episode (or current) unspecified   . Unspecified psychosis   . Encephalopathy   . Esophageal varices without mention of bleeding   . Esophagitis, unspecified   . Abscess of liver   . Chronic kidney disease, unspecified   . Lumbago   . Personal history of alcoholism   . Personal history of tobacco use, presenting hazards to health   . Cirrhosis of liver without mention of alcohol    Past Surgical History  Procedure Laterality Date   . Colostomy    . Cholecystectomy    . Small intestine surgery    . Arm surgery      left arm  . US guided drain placement in ventral hernia abscess  07/21/2010  . Multiple picc line placements    . Esophagogastroduodenoscopy  06/28/2011    Procedure: ESOPHAGOGASTRODUODENOSCOPY (EGD);  Surgeon: Louis Meckel, MD;  Location: [facility];  Service: Endoscopy;  Laterality: N/A;  . Esophagogastroduodenoscopy N/A 05/06/2012    Procedure: ESOPHAGOGASTRODUODENOSCOPY (EGD);  Surgeon: Louis Meckel, MD;  Location: [facility];  Service: Endoscopy;  Laterality: N/A;  . Esophageal banding N/A 05/06/2012    Procedure: ESOPHAGEAL BANDING;  Surgeon: Louis Meckel, MD;  Location: [facility];  Service: Endoscopy;  Laterality: N/A;   Social History:   reports that he quit smoking about 4 years ago. His smoking use included Cigarettes. He smoked 0.00 packs per day. He has never used smokeless tobacco. He reports that he does not drink alcohol or use illicit drugs.  Family History  Problem Relation Age of Onset  . Heart disease Mother     MI  . Lung cancer Brother     Medications: Patient's Medications  New Prescriptions   No medications on file  Previous Medications   ALBUTEROL (PROVENTIL HFA;VENTOLIN HFA) 108 (90 BASE) MCG/ACT INHALER    Inhale 2 puffs into the lungs every 6 (six) hours as needed for wheezing.   ALPRAZOLAM (XANAX) 1 MG TABLET    Take 1 tablet (1 mg total) by mouth 2 (two) times daily as needed for sleep or anxiety. For anxiety.   FERROUS SULFATE 325 (65 FE) MG TABLET    Take 325 mg by mouth at bedtime.   FOLIC ACID (FOLVITE) 1 MG TABLET    Take 1 mg by mouth daily.   FUROSEMIDE (LASIX) 80 MG TABLET    Take 1 tablet (80 mg total) by mouth 2 (two) times daily.   LACTULOSE (CHRONULAC) 10 GM/15ML SOLUTION    Take 30 g by mouth 5 (five) times daily.   LEVETIRACETAM (KEPPRA) 500 MG TABLET    Take 1 tablet (500 mg total) by mouth 3 (three) times daily.   LEVOTHYROXINE (SYNTHROID,  LEVOTHROID) 75 MCG TABLET    Take 75 mcg by mouth daily.   MULTIPLE VITAMIN (MULTIVITAMIN WITH MINERALS) TABS    Take 1 tablet by mouth daily.   OMEPRAZOLE (PRILOSEC) 20 MG CAPSULE    Take 20 mg by mouth daily.   OXYCODONE (OXY IR/ROXICODONE) 5 MG IMMEDIATE RELEASE TABLET    Take two tablets every 4 hours as needed for pain.   POTASSIUM  CHLORIDE (K-DUR,KLOR-CON) 10 MEQ TABLET    Take 1 tablet (10 mEq total) by mouth 2 (two) times daily.   RIFAXIMIN (XIFAXAN) 550 MG TABS    Take 550 mg by mouth 2 (two) times daily.   RISPERIDONE (RISPERDAL) 0.25 MG TABLET    Take 1 tablet (0.25 mg total) by mouth 2 (two) times daily.   SPIRONOLACTONE (ALDACTONE) 100 MG TABLET    Take 200 mg by mouth daily.  Modified Medications   No medications on file  Discontinued Medications   No medications on file   Physical Exam:  Filed Vitals:   06/09/12 0904  BP: 128/82  Pulse: 84  Temp: 98.1 F (36.7 C)  TempSrc: Oral  Resp: 16  Height: 5\' 8"  (1.727 m)  Weight: 275 lb 12.8 oz (125.102 kg)  Physical Exam  Constitutional: He is oriented to person, place, and time.  Obese Caucasian male, mildly jaundiced, has anasarca, chronic venous stasis of LEs  HENT:  Head: Normocephalic and atraumatic.  Right Ear: External ear normal.  Left Ear: External ear normal.  Nose: Nose normal.  Mouth/Throat: Oropharynx is clear and moist. No oropharyngeal exudate.  Eyes: Conjunctivae and EOM are normal. Pupils are equal, round, and reactive to light. Right eye exhibits no discharge. Left eye exhibits no discharge. Scleral icterus is present.  Neck: Normal range of motion. Neck supple. No JVD present. No tracheal deviation present. No thyromegaly present.  Cardiovascular: Normal rate, regular rhythm, normal heart sounds and intact distal pulses.   No murmur heard. Pulmonary/Chest: Effort normal and breath sounds normal.  Abdominal: Soft. Bowel sounds are normal. He exhibits distension. He exhibits no mass. There is no  tenderness. There is no rebound and no guarding. No hernia.  Has ascites, not significantly changed from prior visits  Genitourinary: Guaiac positive stool.  Musculoskeletal: Normal range of motion.  Lymphadenopathy:    He has no cervical adenopathy.  Neurological: He is alert and oriented to person, place, and time. No cranial nerve deficit.  Skin: Skin is warm and dry.  Psychiatric: He has a normal mood and affect. Cognition and memory are impaired. He expresses impulsivity and inappropriate judgment. He exhibits abnormal recent memory.  Does not give honest responses frequently, ? Korsakoff's syndrome    Labs reviewed: 07-30-10: ammonia 51  07-31-10: wbc 5.5; hgb 8.7; hct 25.5; plt 38; glucose 121; bun 20; creat 1.58; k+ 3.8; na++ 136 08-09-10: wbc 3.3; hgb 8.3; hct 24.3; plt 35; glucose 151; bun 18l creat 1.42; k+ 4.1; na++ 134 ast 41; albumin 2.0; ca++ 7.3; tsh 4.563  09-10-10: urine culture: no growth 09-12-10: ammonia 22  09-14-10: chol 96; ldl 37; trig 57; tsh 0.897 09-15-10: iron 161; vit b12: >2000; folate >20.0; dilantin <2.5  09-18-10: wbc 5.0; hgb 10.1; hct 28.7; plt 48; glucose 104 bun 16; creat 0.77; k+ 4.1; na++ 132;  03/02/2011 BMP Glucose 134 Bun 12 creatinine 0.80  Hospital Labs 04/21/2011 BMP Glucose 210 Bun 15 creatinine 0.84  CBC Wbc 5.2 Rbc 3.00 Hemoglobin 10.6  Hospital Labs 04/23/2011 CBC Wbc 4.5 rbc 2.92 Hemoglobin 10.3  Ammonia 64  04/30/2011 Ammonia 195  CBC Wbc 4.0 Rbc 2.88 Hemoglobin 10.2  CMP Glucose 136 Bun 11 Creatinine 0.78  TSH 3.650  05/08/2011 Hospital Labs BMP Glucose 130 Bun 15 Creatinine 0.96  Hepatic Function AST 74 ALT 52  UA  06/21/11:  Na 130, cr 0.72, hgb 11.2, ammonia 190 Basic Metabolic Panel:  Recent Labs  08/65/78 0455  06/01/12 1816 06/02/12 0510 06/03/12  0530  NA 128*  < > 128* 127* 127*  K 4.6  < > 3.6 4.3 3.7  CL 98  < > 94* 97 93*  CO2 21  < > 26 24 28   GLUCOSE 159*  < > 110* 119* 173*  BUN 7  < > 10 10 11   CREATININE 0.61  <  > 0.89 0.69 0.79  CALCIUM 8.0*  < > 8.3* 8.3* 8.0*  TSH 1.403  --   --   --   --   < > = values in this interval not displayed. Liver Function Tests:  Recent Labs  06/01/12 1816 06/02/12 0510 06/03/12 0530  AST 100* 90* 77*  ALT 61* 53 49  ALKPHOS 198* 165* 165*  BILITOT 2.3* 3.7* 2.1*  PROT 7.3 6.3 6.3  ALBUMIN 2.3* 2.1* 2.0*    Recent Labs  04/16/12 2230 05/23/12 1316 06/01/12 2120  AMMONIA 80* 60 26   CBC:  Recent Labs  01/23/12 1403  04/16/12 2123  05/23/12 1143  06/01/12 1816 06/02/12 0510 06/03/12 0530  WBC 4.8  < > 5.0  < > 5.7  < > 4.1 3.9* 3.0*  NEUTROABS 3.9  --  3.3  --  4.0  --   --   --   --   HGB 13.6  < > 12.0*  < > 12.2*  < > 12.7* 11.9* 11.5*  HCT 37.6*  < > 33.6*  < > 34.3*  < > 36.1* 33.2* 31.9*  MCV 100.5*  < > 98.8  < > 101.5*  < > 102.8* 101.5* 101.9*  PLT 44*  < > 56*  < > 48*  < > 50* 36* 31*  < > = values in this interval not displayed.  Past Procedures:  Assessment/Plan ETOH abuse Says last drink was 3 weeks ago--12oz bud light.  Has friend who keeps him off the alcohol and daughter also looks after him.    Seizure disorder keppra dose was adjusted in hospital b/c had 2 seizures.    Hepatic encephalopathy Has frequent exacerbations when he does not take lactulose 5 doses per day properly.  Daughter puts out for him.  Ascites Large abdominal ascites.  No abdominal pain.    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  Has easy bruising and bleeding.    Unspecified hypothyroidism TSH in February was good.  Continue to monitor.     Labs/tests ordered:  Ammonia, cbc, cmp, TSH

## 2012-06-09 NOTE — Assessment & Plan Note (Signed)
keppra dose was adjusted in hospital b/c had 2 seizures.

## 2012-06-09 NOTE — Patient Instructions (Signed)
DO NOT DRINK!  Avoid using the stove and oven.  Stick to the microwave.  Take lactulose as directed.    I will follow-up your labs before your next visit in 3 months.

## 2012-06-09 NOTE — Assessment & Plan Note (Signed)
Has frequent exacerbations when he does not take lactulose 5 doses per day properly.  Daughter puts out for him.

## 2012-07-01 ENCOUNTER — Other Ambulatory Visit: Payer: Self-pay | Admitting: Geriatric Medicine

## 2012-07-01 DIAGNOSIS — M545 Low back pain: Secondary | ICD-10-CM

## 2012-07-01 MED ORDER — OXYCODONE HCL 10 MG PO TABS: 10.0000 mg | ORAL_TABLET | ORAL | Status: DC | PRN

## 2012-07-07 ENCOUNTER — Ambulatory Visit: Payer: Medicare Other | Admitting: Gastroenterology

## 2012-07-10 ENCOUNTER — Other Ambulatory Visit: Payer: Self-pay | Admitting: Geriatric Medicine

## 2012-07-10 MED ORDER — FUROSEMIDE 80 MG PO TABS: 80.0000 mg | ORAL_TABLET | Freq: Two times a day (BID) | ORAL | Status: DC

## 2012-07-10 MED ORDER — POTASSIUM CHLORIDE CRYS ER 10 MEQ PO TBCR: 10.0000 meq | EXTENDED_RELEASE_TABLET | Freq: Two times a day (BID) | ORAL | Status: DC

## 2012-07-25 ENCOUNTER — Emergency Department (HOSPITAL_COMMUNITY)
Admission: EM | Admit: 2012-07-25 | Discharge: 2012-07-25 | Disposition: A | Discharge status: Home / Self Care | Payer: Medicare Other | Attending: Emergency Medicine | Admitting: Emergency Medicine

## 2012-07-25 ENCOUNTER — Emergency Department (HOSPITAL_COMMUNITY): Payer: Medicare Other

## 2012-07-25 ENCOUNTER — Encounter (HOSPITAL_COMMUNITY): Payer: Self-pay | Admitting: Emergency Medicine

## 2012-07-25 DIAGNOSIS — D649 Anemia, unspecified: Secondary | ICD-10-CM | POA: Insufficient documentation

## 2012-07-25 DIAGNOSIS — I129 Hypertensive chronic kidney disease with stage 1 through stage 4 chronic kidney disease, or unspecified chronic kidney disease: Secondary | ICD-10-CM | POA: Insufficient documentation

## 2012-07-25 DIAGNOSIS — Z8659 Personal history of other mental and behavioral disorders: Secondary | ICD-10-CM | POA: Insufficient documentation

## 2012-07-25 DIAGNOSIS — Z8719 Personal history of other diseases of the digestive system: Secondary | ICD-10-CM | POA: Insufficient documentation

## 2012-07-25 DIAGNOSIS — Z4801 Encounter for change or removal of surgical wound dressing: Secondary | ICD-10-CM | POA: Insufficient documentation

## 2012-07-25 DIAGNOSIS — Z872 Personal history of diseases of the skin and subcutaneous tissue: Secondary | ICD-10-CM | POA: Insufficient documentation

## 2012-07-25 DIAGNOSIS — Z8669 Personal history of other diseases of the nervous system and sense organs: Secondary | ICD-10-CM | POA: Insufficient documentation

## 2012-07-25 DIAGNOSIS — S0003XA Contusion of scalp, initial encounter: Secondary | ICD-10-CM | POA: Insufficient documentation

## 2012-07-25 DIAGNOSIS — S1093XA Contusion of unspecified part of neck, initial encounter: Secondary | ICD-10-CM

## 2012-07-25 DIAGNOSIS — M129 Arthropathy, unspecified: Secondary | ICD-10-CM | POA: Insufficient documentation

## 2012-07-25 DIAGNOSIS — Z79899 Other long term (current) drug therapy: Secondary | ICD-10-CM | POA: Insufficient documentation

## 2012-07-25 DIAGNOSIS — G40909 Epilepsy, unspecified, not intractable, without status epilepticus: Secondary | ICD-10-CM | POA: Insufficient documentation

## 2012-07-25 DIAGNOSIS — Z87891 Personal history of nicotine dependence: Secondary | ICD-10-CM | POA: Insufficient documentation

## 2012-07-25 DIAGNOSIS — R739 Hyperglycemia, unspecified: Secondary | ICD-10-CM

## 2012-07-25 DIAGNOSIS — Z4802 Encounter for removal of sutures: Secondary | ICD-10-CM

## 2012-07-25 DIAGNOSIS — E1169 Type 2 diabetes mellitus with other specified complication: Secondary | ICD-10-CM | POA: Insufficient documentation

## 2012-07-25 DIAGNOSIS — D6959 Other secondary thrombocytopenia: Secondary | ICD-10-CM | POA: Insufficient documentation

## 2012-07-25 DIAGNOSIS — N189 Chronic kidney disease, unspecified: Secondary | ICD-10-CM | POA: Insufficient documentation

## 2012-07-25 DIAGNOSIS — Z8679 Personal history of other diseases of the circulatory system: Secondary | ICD-10-CM | POA: Insufficient documentation

## 2012-07-25 DIAGNOSIS — F1021 Alcohol dependence, in remission: Secondary | ICD-10-CM | POA: Insufficient documentation

## 2012-07-25 DIAGNOSIS — S4980XA Other specified injuries of shoulder and upper arm, unspecified arm, initial encounter: Secondary | ICD-10-CM | POA: Insufficient documentation

## 2012-07-25 DIAGNOSIS — Z7901 Long term (current) use of anticoagulants: Secondary | ICD-10-CM | POA: Insufficient documentation

## 2012-07-25 DIAGNOSIS — Z8619 Personal history of other infectious and parasitic diseases: Secondary | ICD-10-CM | POA: Insufficient documentation

## 2012-07-25 DIAGNOSIS — S46909A Unspecified injury of unspecified muscle, fascia and tendon at shoulder and upper arm level, unspecified arm, initial encounter: Secondary | ICD-10-CM | POA: Insufficient documentation

## 2012-07-25 DIAGNOSIS — Z8639 Personal history of other endocrine, nutritional and metabolic disease: Secondary | ICD-10-CM | POA: Insufficient documentation

## 2012-07-25 DIAGNOSIS — Z8739 Personal history of other diseases of the musculoskeletal system and connective tissue: Secondary | ICD-10-CM | POA: Insufficient documentation

## 2012-07-25 DIAGNOSIS — Z8709 Personal history of other diseases of the respiratory system: Secondary | ICD-10-CM | POA: Insufficient documentation

## 2012-07-25 DIAGNOSIS — Z933 Colostomy status: Secondary | ICD-10-CM | POA: Insufficient documentation

## 2012-07-25 DIAGNOSIS — Y9289 Other specified places as the place of occurrence of the external cause: Secondary | ICD-10-CM | POA: Insufficient documentation

## 2012-07-25 DIAGNOSIS — R296 Repeated falls: Secondary | ICD-10-CM | POA: Insufficient documentation

## 2012-07-25 DIAGNOSIS — Z880 Allergy status to penicillin: Secondary | ICD-10-CM | POA: Insufficient documentation

## 2012-07-25 DIAGNOSIS — K219 Gastro-esophageal reflux disease without esophagitis: Secondary | ICD-10-CM | POA: Insufficient documentation

## 2012-07-25 DIAGNOSIS — F319 Bipolar disorder, unspecified: Secondary | ICD-10-CM | POA: Insufficient documentation

## 2012-07-25 DIAGNOSIS — Z87448 Personal history of other diseases of urinary system: Secondary | ICD-10-CM | POA: Insufficient documentation

## 2012-07-25 DIAGNOSIS — Y9301 Activity, walking, marching and hiking: Secondary | ICD-10-CM | POA: Insufficient documentation

## 2012-07-25 DIAGNOSIS — E669 Obesity, unspecified: Secondary | ICD-10-CM | POA: Insufficient documentation

## 2012-07-25 DIAGNOSIS — E039 Hypothyroidism, unspecified: Secondary | ICD-10-CM | POA: Insufficient documentation

## 2012-07-25 LAB — CBC WITH DIFFERENTIAL/PLATELET
Basophils Absolute: 0 10*3/uL (ref 0.0–0.1)
Eosinophils Relative: 1 % (ref 0–5)
Lymphocytes Relative: 10 % — ABNORMAL LOW (ref 12–46)
Monocytes Relative: 7 % (ref 3–12)
Platelets: 38 10*3/uL — ABNORMAL LOW (ref 150–400)
RBC: 3.54 MIL/uL — ABNORMAL LOW (ref 4.22–5.81)
RDW: 14.6 % (ref 11.5–15.5)
WBC: 8 10*3/uL (ref 4.0–10.5)

## 2012-07-25 LAB — POCT I-STAT, CHEM 8
BUN: 17 mg/dL (ref 6–23)
Calcium, Ion: 1.08 mmol/L — ABNORMAL LOW (ref 1.12–1.23)
Chloride: 85 mEq/L — ABNORMAL LOW (ref 96–112)
Creatinine, Ser: 1 mg/dL (ref 0.50–1.35)
Glucose, Bld: 448 mg/dL — ABNORMAL HIGH (ref 70–99)
HCT: 40 % (ref 39.0–52.0)
Hemoglobin: 13.6 g/dL (ref 13.0–17.0)
Potassium: 4.4 mEq/L (ref 3.5–5.1)
Sodium: 125 mEq/L — ABNORMAL LOW (ref 135–145)
TCO2: 31 mmol/L (ref 0–100)

## 2012-07-25 LAB — GLUCOSE, CAPILLARY: Glucose-Capillary: 343 mg/dL — ABNORMAL HIGH (ref 70–99)

## 2012-07-25 LAB — PROTIME-INR
INR: 1.73 — ABNORMAL HIGH (ref 0.00–1.49)
Prothrombin Time: 19.7 seconds — ABNORMAL HIGH (ref 11.6–15.2)

## 2012-07-25 LAB — APTT: aPTT: 34 seconds (ref 24–37)

## 2012-07-25 MED ORDER — OXYCODONE-ACETAMINOPHEN 5-325 MG PO TABS
1.0000 | ORAL_TABLET | Freq: Once | ORAL | Status: AC
  Administered 2012-07-25: 1 via ORAL
  Filled 2012-07-25: qty 1

## 2012-07-25 MED ORDER — METFORMIN HCL 500 MG PO TABS: 500.0000 mg | ORAL_TABLET | Freq: Two times a day (BID) | ORAL | Status: DC

## 2012-07-25 MED ORDER — SODIUM CHLORIDE 0.9 % IV SOLN
Freq: Once | INTRAVENOUS | Status: AC
  Administered 2012-07-25: 19:00:00 via INTRAVENOUS

## 2012-07-25 MED ORDER — METFORMIN HCL 500 MG PO TABS
500.0000 mg | ORAL_TABLET | Freq: Once | ORAL | Status: AC
  Administered 2012-07-25: 500 mg via ORAL
  Filled 2012-07-25: qty 1

## 2012-07-25 MED ORDER — IOHEXOL 350 MG/ML SOLN
100.0000 mL | Freq: Once | INTRAVENOUS | Status: AC | PRN
  Administered 2012-07-25: 100 mL via INTRAVENOUS

## 2012-07-25 MED ORDER — INSULIN ASPART 100 UNIT/ML ~~LOC~~ SOLN
10.0000 [IU] | Freq: Once | SUBCUTANEOUS | Status: AC
  Administered 2012-07-25: 10 [IU] via SUBCUTANEOUS
  Filled 2012-07-25: qty 10

## 2012-07-25 MED ORDER — SODIUM CHLORIDE 0.9 % IV BOLUS (SEPSIS)
1000.0000 mL | Freq: Once | INTRAVENOUS | Status: AC
  Administered 2012-07-25: 1000 mL via INTRAVENOUS

## 2012-07-25 NOTE — ED Provider Notes (Signed)
Medical screening examination/treatment/procedure(s) were conducted as a shared visit with non-physician practitioner(s) and myself.  I personally evaluated the patient during the encounter  Tony Boyd is a 57 y.o. male hx of cirrhosis, hepatitis C here with fall. Fell 2 days ago and has neck pain and L clavicle swelling. He has hematoma L lateral neck, apart from the carotid. US showed hematoma with a pulsating artery in the middle. CT ordered and showed no active contrast extravasation. He will apply ice and take pain meds and f/u outpatient.   EMERGENCY DEPARTMENT US SOFT TISSUE INTERPRETATION "Study: Limited Ultrasound of the noted body part in comments below"  INDICATIONS: Other (refer to comments) Multiple views of the body part are obtained with a multi-frequency linear probe  PERFORMED BY:  Myself  IMAGES ARCHIVED?: Yes  SIDE:Left  BODY PART:Neck  FINDINGS: Other hematoma with artery in the middle  LIMITATIONS:  Emergent Procedure  INTERPRETATION:  No abcess noted  COMMENT:  There is a hematoma with an arterial vessel inside.      Richardean Canal, MD 07/25/12 [phone]

## 2012-07-25 NOTE — ED Provider Notes (Signed)
History    This chart was scribed for non-physician practitioner Lottie Mussel, PA-C working with Richardean Canal, MD by Toya Smothers, ED Scribe. This patient was seen in room WTR6/WTR6 and the patient's care was started at 4:13 PM.   CSN: [id number]  Arrival date & time 07/25/12  1506   First MD Initiated Contact with Patient 07/25/12 1513      Chief Complaint  Patient presents with  . Fall   Patient is a 57 y.o. male presenting with fall. The history is provided by the patient. No language interpreter was used.  Fall Pertinent negatives include no chest pain, no headaches and no shortness of breath.    HPI Comments:  Tony Boyd is a 57 y.o. male with h/o back pain, hepatitis C, and chest pain, who presents to the Emergency Department c/o fall yesterday. Pt now complains of new, gradual onset, constant left shoulder and lower neck pain. Pain is dull, worse with movement, and alleviated by nothing. Pt reports falling while using a walker onto a concrete surface. Denies cephalic injury, LOC, or dizziness. Despite using cold compress, there has been no improvement to pain. Pt denies headache, diaphoresis, fever, chills, nausea, vomiting, diarrhea, weakness, cough, SOB and any other pain. Pt denies use of tobacco, alcohol, and illicit drug use. No pertinent surgical hx denoted. Pt lives at home with family.   Past Medical History  Diagnosis Date  . Cirrhosis of liver   . Hepatitis C   . ETOH abuse   . Hypothyroid   . Psychosis   . Bipolar disorder   . Seizures   . Arthritis   . Back pain   . Coagulopathy     Hx of  . Thrombocytopenia     Hx of  . Pancytopenia     Hx of  . H/O hypokalemia   . Hyponatremia     Hx of  . Korsakoff psychosis   . H/O abdominal abscess   . H/O esophageal varices   . Metabolic encephalopathy   . Hepatic encephalopathy   . Urinary urgency   . H/O renal failure   . H/O ascites   . Altered mental status   . Anemia   . Ascites   .  Shortness of breath   . Peripheral vascular disease   . GERD (gastroesophageal reflux disease)   . Thrombocytopenia, acquired 06/02/2012  . Unspecified hypothyroidism 06/02/2012  . Pain in joint, pelvic region and thigh   . Edema   . Obesity, unspecified   . Chest pain, unspecified   . Pneumonitis due to inhalation of food or vomitus   . Other convulsions   . Cellulitis and abscess of upper arm and forearm   . Chronic hepatitis C without mention of hepatic coma   . Hypopotassemia   . Anemia, unspecified   . Other and unspecified coagulation defects   . Thrombocytopenia, unspecified   . Bipolar I disorder, most recent episode (or current) unspecified   . Unspecified psychosis   . Encephalopathy   . Esophageal varices without mention of bleeding   . Esophagitis, unspecified   . Abscess of liver(572.0)   . Chronic kidney disease, unspecified   . Lumbago   . Personal history of alcoholism   . Personal history of tobacco use, presenting hazards to health   . Cirrhosis of liver without mention of alcohol     Past Surgical History  Procedure Laterality Date  . Colostomy    . Cholecystectomy    .  Small intestine surgery    . Arm surgery      left arm  . US guided drain placement in ventral hernia abscess  07/21/2010  . Multiple picc line placements    . Esophagogastroduodenoscopy  06/28/2011    Procedure: ESOPHAGOGASTRODUODENOSCOPY (EGD);  Surgeon: Louis Meckel, MD;  Location: [facility];  Service: Endoscopy;  Laterality: N/A;  . Esophagogastroduodenoscopy N/A 05/06/2012    Procedure: ESOPHAGOGASTRODUODENOSCOPY (EGD);  Surgeon: Louis Meckel, MD;  Location: [facility];  Service: Endoscopy;  Laterality: N/A;  . Esophageal banding N/A 05/06/2012    Procedure: ESOPHAGEAL BANDING;  Surgeon: Louis Meckel, MD;  Location: [facility];  Service: Endoscopy;  Laterality: N/A;    Family History  Problem Relation Age of Onset  . Heart disease Mother     MI  . Lung cancer Brother      History  Substance Use Topics  . Smoking status: Former Smoker    Types: Cigarettes    Quit date: 02/15/2008  . Smokeless tobacco: Never Used  . Alcohol Use: No      Review of Systems  Constitutional: Negative for fever and diaphoresis.  HENT: Positive for neck pain. Negative for neck stiffness.   Eyes: Negative for visual disturbance.  Respiratory: Negative for apnea, chest tightness and shortness of breath.   Cardiovascular: Negative for chest pain and palpitations.  Gastrointestinal: Negative for nausea, vomiting, diarrhea and constipation.  Genitourinary: Negative for dysuria.  Musculoskeletal: Negative for gait problem.  Skin: Negative for rash.  Neurological: Negative for dizziness, weakness, light-headedness, numbness and headaches.    Allergies  Droperidol; Ketorolac; Penicillins; and Toradol  Home Medications   Current Outpatient Rx  Name  Route  Sig  Dispense  Refill  . albuterol (PROVENTIL HFA;VENTOLIN HFA) 108 (90 BASE) MCG/ACT inhaler   Inhalation   Inhale 2 puffs into the lungs every 6 (six) hours as needed for wheezing.         . ferrous sulfate 325 (65 FE) MG tablet   Oral   Take 325 mg by mouth at bedtime.         . folic acid (FOLVITE) 1 MG tablet   Oral   Take 1 mg by mouth daily.         . furosemide (LASIX) 80 MG tablet   Oral   Take 80 mg by mouth 2 (two) times daily.         [name] lactulose (CHRONULAC) 10 GM/15ML solution   Oral   Take 30 g by mouth 5 (five) times daily.         [name] levETIRAcetam (KEPPRA) 500 MG tablet   Oral   Take 500 mg by mouth 3 (three) times daily.         [name] levothyroxine (SYNTHROID, LEVOTHROID) 75 MCG tablet   Oral   Take 75 mcg by mouth daily.         [name] LORazepam (ATIVAN) 1 MG tablet   Oral   Take 1 mg by mouth 2 (two) times daily as needed for anxiety.         . Multiple Vitamin (MULTIVITAMIN WITH MINERALS) TABS   Oral   Take 1 tablet by mouth daily.         [name] omeprazole (PRILOSEC) 20  MG capsule   Oral   Take 20 mg by mouth daily.         [name] oxycodone (OXY-IR) 5 MG capsule   Oral   Take 10 mg by mouth every  4 (four) hours as needed for pain.         . potassium chloride (K-DUR) 10 MEQ tablet   Oral   Take 10 mEq by mouth 2 (two) times daily.         . rifaximin (XIFAXAN) 550 MG TABS   Oral   Take 550 mg by mouth 2 (two) times daily.         . risperiDONE (RISPERDAL) 0.25 MG tablet   Oral   Take 0.25 mg by mouth 2 (two) times daily.         [name] spironolactone (ALDACTONE) 100 MG tablet   Oral   Take 200 mg by mouth daily.           BP 133/65  Pulse 95  Temp(Src) 99 F (37.2 C) (Oral)  Resp 16  Wt 260 lb (117.935 kg)  BMI 39.54 kg/m2  SpO2 95%  Physical Exam  Nursing note and vitals reviewed. Constitutional: He is oriented to person, place, and time. He appears well-developed and well-nourished. No distress.  HENT:  Head: Normocephalic and atraumatic.  Eyes: Conjunctivae and EOM are normal. Pupils are equal, round, and reactive to light. Right eye exhibits no discharge. Left eye exhibits no discharge.  Neck: Neck supple. No tracheal deviation present.  Cardiovascular: Normal rate.   Normal distal radial pulses in hand  Pulmonary/Chest: Effort normal. No respiratory distress.  Abdominal: He exhibits no distension.  Musculoskeletal: Normal range of motion.  Tender over the left clavicle Bruising and swelling over the left clavicle Midline clavicle tenderness Did not test ROm of shoulder due to pain  Neurological: He is alert and oriented to person, place, and time. No sensory deficit. Coordination normal.  Skin: Skin is dry.  Psychiatric: He has a normal mood and affect. His behavior is normal.    ED Course  Procedures DIAGNOSTIC STUDIES: Oxygen Saturation is 95% on room air, normal by my interpretation.    COORDINATION OF CARE: 15:37- Ordered DG Shoulder Left 1 time imaging. 15:38- Evaluated Pt. Pt is awake, alert, and without  distress. 15:42- Patient understand and agree with initial ED impression and plan with expectations set for ED visit. 15:44- DG Cervical Spine Complete 1 time imaging.  Results for orders placed during the hospital encounter of 07/25/12  CBC WITH DIFFERENTIAL      Result Value Range   WBC 8.0  4.0 - 10.5 K/uL   RBC 3.54 (*) 4.22 - 5.81 MIL/uL   Hemoglobin 12.7 (*) 13.0 - 17.0 g/dL   HCT 11.9 (*) 14.7 - 82.9 %   MCV 101.4 (*) 78.0 - 100.0 fL   MCH 35.9 (*) 26.0 - 34.0 pg   MCHC 35.4  30.0 - 36.0 g/dL   RDW 56.2  13.0 - 86.5 %   Platelets 38 (*) 150 - 400 K/uL   Neutrophils Relative % 82 (*) 43 - 77 %   Lymphocytes Relative 10 (*) 12 - 46 %   Monocytes Relative 7  3 - 12 %   Eosinophils Relative 1  0 - 5 %   Basophils Relative 0  0 - 1 %   Neutro Abs 6.5  1.7 - 7.7 K/uL   Lymphs Abs 0.8  0.7 - 4.0 K/uL   Monocytes Absolute 0.6  0.1 - 1.0 K/uL   Eosinophils Absolute 0.1  0.0 - 0.7 K/uL   Basophils Absolute 0.0  0.0 - 0.1 K/uL   Smear Review PLATELET COUNT CONFIRMED BY SMEAR    PROTIME-INR  Result Value Range   Prothrombin Time 19.7 (*) 11.6 - 15.2 seconds   INR 1.73 (*) 0.00 - 1.49  APTT      Result Value Range   aPTT 34  24 - 37 seconds  GLUCOSE, CAPILLARY      Result Value Range   Glucose-Capillary 343 (*) 70 - 99 mg/dL  POCT I-STAT, CHEM 8      Result Value Range   Sodium 125 (*) 135 - 145 mEq/L   Potassium 4.4  3.5 - 5.1 mEq/L   Chloride 85 (*) 96 - 112 mEq/L   BUN 17  6 - 23 mg/dL   Creatinine, Ser 9.60  0.50 - 1.35 mg/dL   Glucose, Bld 454 (*) 70 - 99 mg/dL   Calcium, Ion 0.98 (*) 1.12 - 1.23 mmol/L   TCO2 31  0 - 100 mmol/L   Hemoglobin 13.6  13.0 - 17.0 g/dL   HCT 11.9  14.7 - 82.9 %   Dg Cervical Spine Complete  07/25/2012   *RADIOLOGY REPORT*  Clinical Data: Larey Seat from a motorized chair.  Left sided neck pain.  CERVICAL SPINE - COMPLETE 4+ VIEW  Comparison: Cervical spine CT 10/23/2009.  Findings: Anatomic alignment.  No visible fractures.  Disc spaces  well preserved.  Calcification in the anterior annular fibers at C4- 5 and C5-6, unchanged.  Normal prevertebral soft tissues.  Facet joints intact.  No significant bony foraminal stenoses.  No static evidence of instability.  Calcification in the nuchal ligament posterior to C6.  IMPRESSION: No evidence of fracture or static signs of instability.  No significant abnormality.   Original Report Authenticated By: Hulan Saas, M.D.   Dg Clavicle Left  07/25/2012   *RADIOLOGY REPORT*  Clinical Data: Larey Seat from a motorized chair, landing on the left shoulder.  LEFT CLAVICLE - 2+ VIEWS  Comparison: No prior clavicle imaging.  Visualized left clavicle and chest x-ray 04/16/2012.  Findings: No fractures identified involving the clavicle. Acromioclavicular joint intact with mild to moderate degenerative changes.  IMPRESSION: No acute osseous abnormality.  Degenerative changes in the [place] joint.   Original Report Authenticated By: Hulan Saas, M.D.   Ct Angio Neck W/cm &/or Wo/cm  07/25/2012   *RADIOLOGY REPORT*  Clinical Data:  Fall.  Hematoma in the left neck.  CT NECK WITHOUT CONTRAST  Technique:  Multidetector CT imaging of the neck was performed following the standard protocol without intravenous contrast.  Comparison:  CT head without contrast 06/01/2012  Findings:  There is a common origin of the innominate artery and the left common carotid artery.  Both vertebral arteries originate from the subclavian arteries.  The right vertebral artery is the dominant vessel.  There are no significant stenoses in the neck. The PICA origins are visualized and normal.  The basilar artery is within normal limits.  Both posterior cerebral arteries originate from the basilar tip.  The right common carotid artery is within normal limits.  The right carotid bifurcation is normal.  There is moderate tortuosity of the high cervical right ICA without a significant stenosis.  Minimal atherosclerotic calcifications are present  within the cavernous segment of the right internal carotid artery without a significant stenosis.  The left common carotid artery is mildly tortuous.  The left carotid bifurcation is within normal limits.  Moderate tortuosity is noted in the high cervical left ICA.  The intracranial left ICA demonstrates mild atherosclerotic calcifications at the cavernous segment without a significant stenosis.  The A1 and M1 segments are  normal.  The anterior communicating artery is patent.  The carotid bifurcations are unremarkable.  The visualized branch vessels demonstrate mild segmental irregularity without a significant proximal stenosis, aneurysm, or occlusion.  IMPRESSION:  1.  Moderate tortuosity of the cervical internal carotid arteries bilaterally. 2.  No significant carotid artery disease. 3.  Tortuosity of the proximal vertebral arteries bilaterally without a significant stenosis.  The right vertebral artery is the dominant vessel. 4.  Mild distal intracranial small vessel disease.   Original Report Authenticated By: Marin Roberts, M.D.    3 staples removed from top of the head that pt had in for 1 month. Wound healed. No signs of infection.   Labs Reviewed - No data to display No results found.   1. Hyperglycemia   2. Hematoma of neck, initial encounter   3. Encounter for staple removal       MDM  Pt with left clavicle/neck hematoma. Bedside US by Dr. Silverio Lay performed which showed most likely hematoma, however, CT angio neck obtained due to possible vessel involvement.  CT negative. Suspect hematoma due to fall. X-rays negative as well. Fall occurred 2 days ago. Pt has no other complaints, no neuro deficits. Pt's blood sugar elevated in ED, 448. No hx of diabeters. 1L of NS given and insulin 10 U SQ. pts recheck cbg is 343. Anion gap is 9. Will start on metformin. Plan to follow up with PCP, pt has apt on Monday.    Filed Vitals:   07/25/12 1530 07/25/12 1929  BP: 133/65 128/64  Pulse: 95 80   Temp: 99 F (37.2 C)   TempSrc: Oral   Resp: 16 16  Weight: 260 lb (117.935 kg)   SpO2: 95% 97%     I personally performed the services described in this documentation, which was scribed in my presence. The recorded information has been reviewed and is accurate.    Lottie Mussel, PA-C 07/25/12 2149

## 2012-07-25 NOTE — ED Notes (Signed)
Patient was using his wheeled walker on Wed when he fell back hitting his left shoulder and back of head.  Patient having shoulder pain but no head pain.  No LOC.  No bumps bruises or lacerations to head area.

## 2012-07-29 ENCOUNTER — Other Ambulatory Visit: Payer: Self-pay | Admitting: *Deleted

## 2012-07-29 MED ORDER — OXYCODONE HCL 5 MG PO CAPS: 10.0000 mg | ORAL_CAPSULE | ORAL | Status: DC | PRN

## 2012-07-29 MED ORDER — OXYCODONE HCL 10 MG PO TABS: ORAL_TABLET | ORAL | Status: DC

## 2012-07-31 ENCOUNTER — Encounter: Payer: Self-pay | Admitting: *Deleted

## 2012-07-31 ENCOUNTER — Ambulatory Visit (INDEPENDENT_AMBULATORY_CARE_PROVIDER_SITE_OTHER): Payer: Medicare Other | Admitting: Internal Medicine

## 2012-07-31 ENCOUNTER — Encounter: Payer: Self-pay | Admitting: Internal Medicine

## 2012-07-31 VITALS — BP 138/76 | HR 107 | Temp 98.0°F | Resp 22 | Ht 68.0 in | Wt 278.0 lb

## 2012-07-31 DIAGNOSIS — K7031 Alcoholic cirrhosis of liver with ascites: Secondary | ICD-10-CM

## 2012-07-31 DIAGNOSIS — R739 Hyperglycemia, unspecified: Secondary | ICD-10-CM

## 2012-07-31 DIAGNOSIS — K7290 Hepatic failure, unspecified without coma: Secondary | ICD-10-CM

## 2012-07-31 DIAGNOSIS — K7682 Hepatic encephalopathy: Secondary | ICD-10-CM

## 2012-07-31 DIAGNOSIS — K7030 Alcoholic cirrhosis of liver without ascites: Secondary | ICD-10-CM

## 2012-07-31 DIAGNOSIS — R7309 Other abnormal glucose: Secondary | ICD-10-CM

## 2012-07-31 NOTE — Progress Notes (Signed)
Patient ID: Tony Boyd, male   DOB: 04/11/1955, 57 y.o.   MRN: [id number] Code Status: full code   Allergies  Allergen Reactions  . Droperidol Other (See Comments)    unknown  . Ketorolac Other (See Comments)    unknown  . Penicillins Swelling and Other (See Comments)    unkown  . Toradol (Ketorolac Tromethamine) Hives    Chief Complaint  Patient presents with  . Hospitalization Follow-up    fell - went to ER and blood sugar dropped    HPI: Patient is a 57 y.o. 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 Fell Monday when walking in heat and about to get back to [place].  Clavicle injured--not fractured per imaging report.   Sugar was high and he was started on medication for this. Was not intoxicated at the time, he says.   Denies drinking with his friend.  (Daughter notes he's been known to do this.)  Tries to keep eating.  Is on fluid restriction, but drinks more than he is supposed to drink.  Lasix was upped to 80mg  po bid by Dr. Arlyce Dice to prevent having to have paracentesis, but may be necessary.   Was started on metformin 500mg  po bid.    Review of Systems:  Review of Systems  Constitutional: Negative for malaise/fatigue.  HENT: Negative for congestion.   Eyes: Negative for blurred vision.  Respiratory: Negative for shortness of breath.   Cardiovascular: Negative for chest pain.  Gastrointestinal: Negative for abdominal pain, constipation, blood in stool and melena.  Genitourinary: Negative for dysuria.  Musculoskeletal: Positive for falls. Negative for myalgias.  Skin: Negative for rash.  Neurological: Positive for dizziness.  Endo/Heme/Allergies: Bruises/bleeds easily.  Psychiatric/Behavioral: Positive for memory loss. The patient has insomnia.     Past Medical History  Diagnosis Date  . Cirrhosis of  liver   . Hepatitis C   . ETOH abuse   . Hypothyroid   . Psychosis   . Bipolar disorder   . Seizures   . Arthritis   . Back pain   . Coagulopathy     Hx of  . Thrombocytopenia     Hx of  . Pancytopenia     Hx of  . H/O hypokalemia   . Hyponatremia     Hx of  . Korsakoff psychosis   . H/O abdominal abscess   . H/O esophageal varices   . Metabolic encephalopathy   . Hepatic encephalopathy   . Urinary urgency   . H/O renal failure   . H/O ascites   . Altered mental status   . Anemia   . Ascites   . Shortness of breath   . Peripheral vascular disease   . GERD (gastroesophageal reflux disease)   . Thrombocytopenia, acquired 06/02/2012  . Unspecified hypothyroidism 06/02/2012  . Pain in joint, pelvic region and thigh   . Edema   . Obesity, unspecified   . Chest pain, unspecified   . Pneumonitis due to inhalation of food or vomitus   . Other convulsions   . Cellulitis and abscess of upper arm and forearm   . Chronic hepatitis C without mention of hepatic coma   . Hypopotassemia   . Anemia, unspecified   . Other and unspecified coagulation defects   . Thrombocytopenia, unspecified   . Bipolar I disorder, most recent episode (or current) unspecified   .  Unspecified psychosis   . Encephalopathy   . Esophageal varices without mention of bleeding   . Esophagitis, unspecified   . Abscess of liver(572.0)   . Chronic kidney disease, unspecified   . Lumbago   . Personal history of alcoholism   . Personal history of tobacco use, presenting hazards to health   . Cirrhosis of liver without mention of alcohol   . Pneumonitis due to inhalation of food or vomitus 09/19/2010  . Chronic hepatitis C without mention of hepatic coma   . Bipolar I disorder, most recent episode (or current) unspecified   . Unspecified psychosis   . Other ascites   . Personal history of tobacco use, presenting hazards to health   . Cirrhosis of liver without mention of alcohol    Past Surgical History   Procedure Laterality Date  . Colostomy    . Cholecystectomy    . Small intestine surgery    . Arm surgery      left arm  . US guided drain placement in ventral hernia abscess  07/21/2010  . Multiple picc line placements    . Esophagogastroduodenoscopy  06/28/2011    Procedure: ESOPHAGOGASTRODUODENOSCOPY (EGD);  Surgeon: Louis Meckel, MD;  Location: [facility];  Service: Endoscopy;  Laterality: N/A;  . Esophagogastroduodenoscopy N/A 05/06/2012    Procedure: ESOPHAGOGASTRODUODENOSCOPY (EGD);  Surgeon: Louis Meckel, MD;  Location: [facility];  Service: Endoscopy;  Laterality: N/A;  . Esophageal banding N/A 05/06/2012    Procedure: ESOPHAGEAL BANDING;  Surgeon: Louis Meckel, MD;  Location: [facility];  Service: Endoscopy;  Laterality: N/A;   Social History:   reports that he quit smoking about 4 years ago. His smoking use included Cigarettes. He smoked 0.00 packs per day. He has never used smokeless tobacco. He reports that he does not drink alcohol or use illicit drugs.  Family History  Problem Relation Age of Onset  . Heart disease Mother     MI  . Lung cancer Brother     Medications: Patient's Medications  New Prescriptions   No medications on file  Previous Medications   ALBUTEROL (PROVENTIL HFA;VENTOLIN HFA) 108 (90 BASE) MCG/ACT INHALER    Inhale 2 puffs into the lungs every 6 (six) hours as needed for wheezing.   FERROUS SULFATE 325 (65 FE) MG TABLET    Take 325 mg by mouth at bedtime.   FOLIC ACID (FOLVITE) 1 MG TABLET    Take 1 mg by mouth daily.   FUROSEMIDE (LASIX) 80 MG TABLET    Take 80 mg by mouth 2 (two) times daily.   LACTULOSE (CHRONULAC) 10 GM/15ML SOLUTION    Take 30 g by mouth 5 (five) times daily.   LEVETIRACETAM (KEPPRA) 500 MG TABLET    Take 500 mg by mouth 3 (three) times daily.   LEVOTHYROXINE (SYNTHROID, LEVOTHROID) 75 MCG TABLET    Take 75 mcg by mouth daily.   LORAZEPAM (ATIVAN) 1 MG TABLET    Take 1 mg by mouth 2 (two) times daily as needed  for anxiety.   METFORMIN (GLUCOPHAGE) 500 MG TABLET    Take 1 tablet (500 mg total) by mouth 2 (two) times daily with a meal.   MULTIPLE VITAMIN (MULTIVITAMIN WITH MINERALS) TABS    Take 1 tablet by mouth daily.   OMEPRAZOLE (PRILOSEC) 20 MG CAPSULE    Take 20 mg by mouth daily.   OXYCODONE HCL 10 MG TABS    Take one tablet every four hours as needed for pain  POTASSIUM CHLORIDE (K-DUR) 10 MEQ TABLET    Take 10 mEq by mouth 2 (two) times daily.   RIFAXIMIN (XIFAXAN) 550 MG TABS    Take 550 mg by mouth 2 (two) times daily.   RISPERIDONE (RISPERDAL) 0.25 MG TABLET    Take 0.25 mg by mouth 2 (two) times daily.   SPIRONOLACTONE (ALDACTONE) 100 MG TABLET    Take 200 mg by mouth daily.  Modified Medications   No medications on file  Discontinued Medications   No medications on file     Physical Exam:  Filed Vitals:   07/31/12 1601  BP: 138/76  Pulse: 107  Temp: 98 F (36.7 C)  TempSrc: Oral  Resp: 22  Height: 5\' 8"  (1.727 m)  Weight: 278 lb (126.1 kg)  SpO2: 97%   Physical Exam  Constitutional: No distress.  Obese white male, ambulates independently  HENT:  Head: Normocephalic and atraumatic.  Eyes: EOM are normal. Pupils are equal, round, and reactive to light.  Neck: Normal range of motion. Neck supple.  Cardiovascular: Normal rate, regular rhythm, normal heart sounds and intact distal pulses.   Pulmonary/Chest: Effort normal and breath sounds normal. No respiratory distress.  Abdominal: Soft. Bowel sounds are normal. He exhibits distension. There is no tenderness.  Musculoskeletal: Normal range of motion.  Wearing sling due to clavicle injury  Neurological: He is alert. No cranial nerve deficit.  Skin: Skin is warm and dry.  Chronic venous stasis changes of legs, slight scleral icterus and jaundice      Labs reviewed: Basic Metabolic Panel:  Recent Labs  78/29/56 0455  06/01/12 1816 06/02/12 0510 06/03/12 0530 07/25/12 1837  NA 128*  < > 128* 127* 127* 125*   K 4.6  < > 3.6 4.3 3.7 4.4  CL 98  < > 94* 97 93* 85*  CO2 21  < > 26 24 28   --   GLUCOSE 159*  < > 110* 119* 173* 448*  BUN 7  < > 10 10 11 17   CREATININE 0.61  < > 0.89 0.69 0.79 1.00  CALCIUM 8.0*  < > 8.3* 8.3* 8.0*  --   TSH 1.403  --   --   --   --   --   < > = values in this interval not displayed. Liver Function Tests:  Recent Labs  06/01/12 1816 06/02/12 0510 06/03/12 0530  AST 100* 90* 77*  ALT 61* 53 49  ALKPHOS 198* 165* 165*  BILITOT 2.3* 3.7* 2.1*  PROT 7.3 6.3 6.3  ALBUMIN 2.3* 2.1* 2.0*   No results found for this basename: LIPASE, AMYLASE,  in the last 8760 hours  Recent Labs  04/16/12 2230 05/23/12 1316 06/01/12 2120  AMMONIA 80* 60 26   CBC:  Recent Labs  04/16/12 2123  05/23/12 1143  06/02/12 0510 06/03/12 0530 07/25/12 1831 07/25/12 1837  WBC 5.0  < > 5.7  < > 3.9* 3.0* 8.0  --   NEUTROABS 3.3  --  4.0  --   --   --  6.5  --   HGB 12.0*  < > 12.2*  < > 11.9* 11.5* 12.7* 13.6  HCT 33.6*  < > 34.3*  < > 33.2* 31.9* 35.9* 40.0  MCV 98.8  < > 101.5*  < > 101.5* 101.9* 101.4*  --   PLT 56*  < > 48*  < > 36* 31* 38*  --   < > = values in this interval not displayed.  Past Procedures:  Dg Cervical Spine Complete  07/25/2012 *RADIOLOGY REPORT* Clinical Data: Larey Seat from a motorized chair. Left sided neck pain. CERVICAL SPINE - COMPLETE 4+ VIEW Comparison: Cervical spine CT 10/23/2009. Findings: Anatomic alignment. No visible fractures. Disc spaces well preserved. Calcification in the anterior annular fibers at C4- 5 and C5-6, unchanged. Normal prevertebral soft tissues. Facet joints intact. No significant bony foraminal stenoses. No static evidence of instability. Calcification in the nuchal ligament posterior to C6. IMPRESSION: No evidence of fracture or static signs of instability. No significant abnormality. Original Report Authenticated By: Hulan Saas, M.D.   Dg Clavicle Left  07/25/2012 *RADIOLOGY REPORT* Clinical Data: Larey Seat from a motorized  chair, landing on the left shoulder. LEFT CLAVICLE - 2+ VIEWS Comparison: No prior clavicle imaging. Visualized left clavicle and chest x-ray 04/16/2012. Findings: No fractures identified involving the clavicle. Acromioclavicular joint intact with mild to moderate degenerative changes. IMPRESSION: No acute osseous abnormality. Degenerative changes in the [facility] joint. Original Report Authenticated By: Hulan Saas, M.D.   Ct Angio Neck W/cm &/or Wo/cm  07/25/2012 *RADIOLOGY REPORT* Clinical Data: Fall. Hematoma in the left neck. CT NECK WITHOUT CONTRAST Technique: Multidetector CT imaging of the neck was performed following the standard protocol without intravenous contrast. Comparison: CT head without contrast 06/01/2012 Findings: There is a common origin of the innominate artery and the left common carotid artery. Both vertebral arteries originate from the subclavian arteries. The right vertebral artery is the dominant vessel. There are no significant stenoses in the neck. The PICA origins are visualized and normal. The basilar artery is within normal limits. Both posterior cerebral arteries originate from the basilar tip. The right common carotid artery is within normal limits. The right carotid bifurcation is normal. There is moderate tortuosity of the high cervical right ICA without a significant stenosis. Minimal atherosclerotic calcifications are present within the cavernous segment of the right internal carotid artery without a significant stenosis. The left common carotid artery is mildly tortuous. The left carotid bifurcation is within normal limits. Moderate tortuosity is noted in the high cervical left ICA. The intracranial left ICA demonstrates mild atherosclerotic calcifications at the cavernous segment without a significant stenosis. The A1 and M1 segments are normal. The anterior communicating artery is patent. The carotid bifurcations are unremarkable. The visualized branch vessels demonstrate mild  segmental irregularity without a significant proximal stenosis, aneurysm, or occlusion. IMPRESSION: 1. Moderate tortuosity of the cervical internal carotid arteries bilaterally. 2. No significant carotid artery disease. 3. Tortuosity of the proximal vertebral arteries bilaterally without a significant stenosis. The right vertebral artery is the dominant vessel. 4. Mild distal intracranial small vessel disease. Original Report Authenticated By: Marin Roberts, M.D.   Assessment/Plan 1. Hyperglycemia--early diabetes -was started on metformin--needs f/u creatinine--may not be able to use this especially as cirrhosis worsens and affects renal function, as well - Hemoglobin A1c - CMP  2. Alcoholic cirrhosis of liver with ascites -pt not adherent with diet, fluid restriction, alcohol avoidance, or lactulose--has recurrent hospitalizations despite best attempts--I do not feel he can safely live independently  3. Hepatic encephalopathy -mentation ok today, but has had his lactulose and just got out of the hospital again -cont lactulose--his daughter is adamant that she puts it out for him, but I don't think he takes it as directed  Labs/tests ordered:  Cmp, hba1c

## 2012-08-01 LAB — COMPREHENSIVE METABOLIC PANEL
ALT: 40 IU/L (ref 0–44)
AST: 51 IU/L — ABNORMAL HIGH (ref 0–40)
Albumin/Globulin Ratio: 0.7 — ABNORMAL LOW (ref 1.1–2.5)
Albumin: 2.6 g/dL — ABNORMAL LOW (ref 3.5–5.5)
Alkaline Phosphatase: 201 IU/L — ABNORMAL HIGH (ref 39–117)
BUN/Creatinine Ratio: 14 (ref 9–20)
BUN: 13 mg/dL (ref 6–24)
CO2: 21 mmol/L (ref 19–28)
Calcium: 8.3 mg/dL — ABNORMAL LOW (ref 8.7–10.2)
Chloride: 84 mmol/L — ABNORMAL LOW (ref 97–108)
Creatinine, Ser: 0.9 mg/dL (ref 0.76–1.27)
GFR calc Af Amer: 109 mL/min/{1.73_m2} (ref >59)
GFR calc non Af Amer: 94 mL/min/{1.73_m2} (ref >59)
Globulin, Total: 4 g/dL (ref 1.5–4.5)
Glucose: 538 mg/dL (ref 65–99)
Potassium: 4.8 mmol/L (ref 3.5–5.2)
Sodium: 120 mmol/L — ABNORMAL LOW (ref 134–144)
Total Bilirubin: 3.9 mg/dL — ABNORMAL HIGH (ref 0.0–1.2)
Total Protein: 6.6 g/dL (ref 6.0–8.5)

## 2012-08-01 LAB — HEMOGLOBIN A1C
Est. average glucose Bld gHb Est-mCnc: 243 mg/dL
Hgb A1c MFr Bld: 10.1 % — ABNORMAL HIGH (ref 4.8–5.6)

## 2012-08-08 ENCOUNTER — Encounter: Payer: Self-pay | Admitting: *Deleted

## 2012-08-08 ENCOUNTER — Inpatient Hospital Stay (HOSPITAL_COMMUNITY)
Admission: EM | Admit: 2012-08-08 | Discharge: 2012-08-13 | DRG: 442 | Disposition: A | Discharge status: Home Health | Payer: Medicare Other | Attending: Internal Medicine | Admitting: Internal Medicine

## 2012-08-08 ENCOUNTER — Encounter (HOSPITAL_COMMUNITY): Payer: Self-pay | Admitting: Emergency Medicine

## 2012-08-08 ENCOUNTER — Telehealth: Payer: Self-pay | Admitting: *Deleted

## 2012-08-08 DIAGNOSIS — K219 Gastro-esophageal reflux disease without esophagitis: Secondary | ICD-10-CM | POA: Diagnosis present

## 2012-08-08 DIAGNOSIS — B1920 Unspecified viral hepatitis C without hepatic coma: Secondary | ICD-10-CM | POA: Diagnosis present

## 2012-08-08 DIAGNOSIS — B182 Chronic viral hepatitis C: Principal | ICD-10-CM | POA: Diagnosis present

## 2012-08-08 DIAGNOSIS — D6959 Other secondary thrombocytopenia: Secondary | ICD-10-CM

## 2012-08-08 DIAGNOSIS — E039 Hypothyroidism, unspecified: Secondary | ICD-10-CM | POA: Diagnosis present

## 2012-08-08 DIAGNOSIS — Z91199 Patient's noncompliance with other medical treatment and regimen due to unspecified reason: Secondary | ICD-10-CM

## 2012-08-08 DIAGNOSIS — G40909 Epilepsy, unspecified, not intractable, without status epilepticus: Secondary | ICD-10-CM | POA: Diagnosis present

## 2012-08-08 DIAGNOSIS — R7309 Other abnormal glucose: Secondary | ICD-10-CM

## 2012-08-08 DIAGNOSIS — Z87891 Personal history of nicotine dependence: Secondary | ICD-10-CM

## 2012-08-08 DIAGNOSIS — K7682 Hepatic encephalopathy: Secondary | ICD-10-CM | POA: Diagnosis present

## 2012-08-08 DIAGNOSIS — F319 Bipolar disorder, unspecified: Secondary | ICD-10-CM | POA: Diagnosis present

## 2012-08-08 DIAGNOSIS — E119 Type 2 diabetes mellitus without complications: Secondary | ICD-10-CM

## 2012-08-08 DIAGNOSIS — R410 Disorientation, unspecified: Secondary | ICD-10-CM

## 2012-08-08 DIAGNOSIS — E871 Hypo-osmolality and hyponatremia: Secondary | ICD-10-CM | POA: Diagnosis present

## 2012-08-08 DIAGNOSIS — Z9119 Patient's noncompliance with other medical treatment and regimen: Secondary | ICD-10-CM

## 2012-08-08 DIAGNOSIS — E1165 Type 2 diabetes mellitus with hyperglycemia: Secondary | ICD-10-CM | POA: Diagnosis present

## 2012-08-08 DIAGNOSIS — R739 Hyperglycemia, unspecified: Secondary | ICD-10-CM

## 2012-08-08 DIAGNOSIS — D689 Coagulation defect, unspecified: Secondary | ICD-10-CM

## 2012-08-08 DIAGNOSIS — F1020 Alcohol dependence, uncomplicated: Secondary | ICD-10-CM | POA: Diagnosis present

## 2012-08-08 DIAGNOSIS — E669 Obesity, unspecified: Secondary | ICD-10-CM | POA: Diagnosis present

## 2012-08-08 DIAGNOSIS — K7290 Hepatic failure, unspecified without coma: Secondary | ICD-10-CM | POA: Diagnosis present

## 2012-08-08 DIAGNOSIS — K7030 Alcoholic cirrhosis of liver without ascites: Secondary | ICD-10-CM | POA: Diagnosis present

## 2012-08-08 DIAGNOSIS — R188 Other ascites: Secondary | ICD-10-CM

## 2012-08-08 DIAGNOSIS — F29 Unspecified psychosis not due to a substance or known physiological condition: Secondary | ICD-10-CM

## 2012-08-08 DIAGNOSIS — D696 Thrombocytopenia, unspecified: Secondary | ICD-10-CM | POA: Diagnosis present

## 2012-08-08 DIAGNOSIS — K7460 Unspecified cirrhosis of liver: Secondary | ICD-10-CM | POA: Diagnosis present

## 2012-08-08 DIAGNOSIS — Z880 Allergy status to penicillin: Secondary | ICD-10-CM

## 2012-08-08 DIAGNOSIS — F1010 Alcohol abuse, uncomplicated: Secondary | ICD-10-CM | POA: Diagnosis present

## 2012-08-08 DIAGNOSIS — Z6841 Body Mass Index (BMI) 40.0 and over, adult: Secondary | ICD-10-CM

## 2012-08-08 DIAGNOSIS — Z794 Long term (current) use of insulin: Secondary | ICD-10-CM

## 2012-08-08 DIAGNOSIS — IMO0002 Reserved for concepts with insufficient information to code with codable children: Secondary | ICD-10-CM | POA: Diagnosis present

## 2012-08-08 LAB — BLOOD GAS, VENOUS
Bicarbonate: 28.5 mEq/L — ABNORMAL HIGH (ref 20.0–24.0)
FIO2: 0.21 %
Patient temperature: 98.6
pCO2, Ven: 45 mmHg (ref 45.0–50.0)
pH, Ven: 7.418 — ABNORMAL HIGH (ref 7.250–7.300)

## 2012-08-08 LAB — URINALYSIS, ROUTINE W REFLEX MICROSCOPIC
Bilirubin Urine: NEGATIVE
Glucose, UA: >1000 mg/dL — AB
Hgb urine dipstick: NEGATIVE
Ketones, ur: NEGATIVE mg/dL
Leukocytes, UA: NEGATIVE
Nitrite: NEGATIVE
Protein, ur: NEGATIVE mg/dL
Specific Gravity, Urine: 1.02 (ref 1.005–1.030)
Urobilinogen, UA: 2 mg/dL — ABNORMAL HIGH (ref 0.0–1.0)
pH: 7 (ref 5.0–8.0)

## 2012-08-08 LAB — KETONES, QUALITATIVE: Acetone, Bld: NEGATIVE

## 2012-08-08 LAB — GLUCOSE, CAPILLARY: Glucose-Capillary: 417 mg/dL — ABNORMAL HIGH (ref 70–99)

## 2012-08-08 LAB — BASIC METABOLIC PANEL
BUN: 14 mg/dL (ref 6–23)
CO2: 30 mEq/L (ref 19–32)
Calcium: 8.7 mg/dL (ref 8.4–10.5)
Chloride: 89 mEq/L — ABNORMAL LOW (ref 96–112)
Creatinine, Ser: 0.81 mg/dL (ref 0.50–1.35)
GFR calc Af Amer: >90 mL/min (ref >90)
GFR calc non Af Amer: >90 mL/min (ref >90)
Glucose, Bld: 430 mg/dL — ABNORMAL HIGH (ref 70–99)
Potassium: 4.5 mEq/L (ref 3.5–5.1)
Sodium: 125 mEq/L — ABNORMAL LOW (ref 135–145)

## 2012-08-08 LAB — AMMONIA: Ammonia: 240 umol/L — ABNORMAL HIGH (ref 11–60)

## 2012-08-08 LAB — PROTIME-INR
INR: 1.71 — ABNORMAL HIGH (ref 0.00–1.49)
Prothrombin Time: 19.5 seconds — ABNORMAL HIGH (ref 11.6–15.2)

## 2012-08-08 LAB — ETHANOL: Alcohol, Ethyl (B): <11 mg/dL (ref 0–11)

## 2012-08-08 LAB — APTT: aPTT: 29 seconds (ref 24–37)

## 2012-08-08 LAB — URINE MICROSCOPIC-ADD ON

## 2012-08-08 MED ORDER — OXYCODONE HCL 5 MG PO TABS
5.0000 mg | ORAL_TABLET | Freq: Four times a day (QID) | ORAL | Status: DC | PRN
  Administered 2012-08-08 – 2012-08-13 (×16): 5 mg via ORAL
  Filled 2012-08-08 (×17): qty 1

## 2012-08-08 MED ORDER — RISPERIDONE 0.25 MG PO TABS
0.2500 mg | ORAL_TABLET | Freq: Two times a day (BID) | ORAL | Status: DC
  Administered 2012-08-08 – 2012-08-13 (×10): 0.25 mg via ORAL
  Filled 2012-08-08 (×11): qty 1

## 2012-08-08 MED ORDER — INSULIN ASPART 100 UNIT/ML ~~LOC~~ SOLN
0.0000 [IU] | Freq: Three times a day (TID) | SUBCUTANEOUS | Status: DC
  Administered 2012-08-08 – 2012-08-09 (×3): 15 [IU] via SUBCUTANEOUS
  Administered 2012-08-09: 11 [IU] via SUBCUTANEOUS
  Administered 2012-08-10: 20 [IU] via SUBCUTANEOUS
  Administered 2012-08-10: 15 [IU] via SUBCUTANEOUS
  Administered 2012-08-10: 20 [IU] via SUBCUTANEOUS
  Administered 2012-08-11: 11 [IU] via SUBCUTANEOUS
  Administered 2012-08-11: 20 [IU] via SUBCUTANEOUS
  Administered 2012-08-11: 15 [IU] via SUBCUTANEOUS
  Administered 2012-08-12 (×2): 7 [IU] via SUBCUTANEOUS
  Administered 2012-08-12: 11 [IU] via SUBCUTANEOUS
  Administered 2012-08-13: 4 [IU] via SUBCUTANEOUS
  Administered 2012-08-13: 7 [IU] via SUBCUTANEOUS

## 2012-08-08 MED ORDER — SODIUM CHLORIDE 0.9 % IJ SOLN
3.0000 mL | Freq: Two times a day (BID) | INTRAMUSCULAR | Status: DC
  Administered 2012-08-08 – 2012-08-13 (×10): 3 mL via INTRAVENOUS

## 2012-08-08 MED ORDER — PNEUMOCOCCAL VAC POLYVALENT 25 MCG/0.5ML IJ INJ
0.5000 mL | INJECTION | INTRAMUSCULAR | Status: AC
  Administered 2012-08-09: 0.5 mL via INTRAMUSCULAR
  Filled 2012-08-08 (×2): qty 0.5

## 2012-08-08 MED ORDER — LEVETIRACETAM 500 MG PO TABS
500.0000 mg | ORAL_TABLET | Freq: Three times a day (TID) | ORAL | Status: DC
  Administered 2012-08-08 – 2012-08-13 (×15): 500 mg via ORAL
  Filled 2012-08-08 (×17): qty 1

## 2012-08-08 MED ORDER — RIFAXIMIN 550 MG PO TABS
550.0000 mg | ORAL_TABLET | Freq: Two times a day (BID) | ORAL | Status: DC
  Administered 2012-08-08 – 2012-08-13 (×10): 550 mg via ORAL
  Filled 2012-08-08 (×11): qty 1

## 2012-08-08 MED ORDER — FERROUS SULFATE 325 (65 FE) MG PO TABS
325.0000 mg | ORAL_TABLET | Freq: Every day | ORAL | Status: DC
  Administered 2012-08-08 – 2012-08-12 (×5): 325 mg via ORAL
  Filled 2012-08-08 (×6): qty 1

## 2012-08-08 MED ORDER — LACTULOSE 10 GM/15ML PO SOLN
30.0000 g | Freq: Once | ORAL | Status: AC
  Administered 2012-08-08: 30 g via ORAL
  Filled 2012-08-08: qty 45

## 2012-08-08 MED ORDER — INSULIN ASPART 100 UNIT/ML ~~LOC~~ SOLN
0.0000 [IU] | Freq: Every day | SUBCUTANEOUS | Status: DC
  Administered 2012-08-08: 3 [IU] via SUBCUTANEOUS
  Administered 2012-08-09 – 2012-08-10 (×2): 4 [IU] via SUBCUTANEOUS
  Administered 2012-08-11: 3 [IU] via SUBCUTANEOUS

## 2012-08-08 MED ORDER — LACTULOSE 10 GM/15ML PO SOLN
10.0000 g | ORAL | Status: DC
  Administered 2012-08-08 – 2012-08-10 (×16): 10 g via ORAL
  Filled 2012-08-08 (×32): qty 15

## 2012-08-08 MED ORDER — SPIRONOLACTONE 100 MG PO TABS
200.0000 mg | ORAL_TABLET | Freq: Every day | ORAL | Status: DC
  Administered 2012-08-09 – 2012-08-13 (×5): 200 mg via ORAL
  Filled 2012-08-08 (×5): qty 2

## 2012-08-08 MED ORDER — SODIUM CHLORIDE 0.9 % IV SOLN: 250.0000 mL | INTRAVENOUS | Status: DC | PRN

## 2012-08-08 MED ORDER — METFORMIN HCL 1000 MG PO TABS: ORAL_TABLET | ORAL | Status: DC

## 2012-08-08 MED ORDER — ALBUTEROL SULFATE HFA 108 (90 BASE) MCG/ACT IN AERS
2.0000 | INHALATION_SPRAY | Freq: Two times a day (BID) | RESPIRATORY_TRACT | Status: DC | PRN
  Administered 2012-08-12: 2 via RESPIRATORY_TRACT
  Filled 2012-08-08 (×2): qty 6.7

## 2012-08-08 MED ORDER — SODIUM CHLORIDE 0.9 % IJ SOLN: 3.0000 mL | INTRAMUSCULAR | Status: DC | PRN

## 2012-08-08 MED ORDER — FOLIC ACID 1 MG PO TABS
1.0000 mg | ORAL_TABLET | Freq: Every day | ORAL | Status: DC
  Administered 2012-08-09 – 2012-08-13 (×5): 1 mg via ORAL
  Filled 2012-08-08 (×5): qty 1

## 2012-08-08 MED ORDER — SODIUM CHLORIDE 0.9 % IV BOLUS (SEPSIS)
2000.0000 mL | Freq: Once | INTRAVENOUS | Status: AC
  Administered 2012-08-08: 2000 mL via INTRAVENOUS

## 2012-08-08 MED ORDER — POTASSIUM CHLORIDE ER 10 MEQ PO TBCR
10.0000 meq | EXTENDED_RELEASE_TABLET | Freq: Two times a day (BID) | ORAL | Status: DC
  Administered 2012-08-08 – 2012-08-11 (×6): 10 meq via ORAL
  Filled 2012-08-08 (×8): qty 1

## 2012-08-08 MED ORDER — PANTOPRAZOLE SODIUM 40 MG PO TBEC
40.0000 mg | DELAYED_RELEASE_TABLET | Freq: Every day | ORAL | Status: DC
  Administered 2012-08-09 – 2012-08-13 (×5): 40 mg via ORAL
  Filled 2012-08-08 (×5): qty 1

## 2012-08-08 MED ORDER — LACTULOSE 10 GM/15ML PO SOLN: 30.0000 g | Freq: Once | ORAL | Status: DC

## 2012-08-08 MED ORDER — LEVOTHYROXINE SODIUM 75 MCG PO TABS
75.0000 ug | ORAL_TABLET | Freq: Every day | ORAL | Status: DC
  Administered 2012-08-09 – 2012-08-13 (×5): 75 ug via ORAL
  Filled 2012-08-08 (×6): qty 1

## 2012-08-08 MED ORDER — FUROSEMIDE 80 MG PO TABS
80.0000 mg | ORAL_TABLET | Freq: Two times a day (BID) | ORAL | Status: DC
  Administered 2012-08-08 – 2012-08-13 (×10): 80 mg via ORAL
  Filled 2012-08-08 (×12): qty 1

## 2012-08-08 NOTE — Telephone Encounter (Signed)
Tony Boyd called in reference to her dad lab work on 07/31/2012. I gave her the orders Per Dr Renato Gails

## 2012-08-08 NOTE — ED Notes (Signed)
ZOX:[id number]<AV> Expected date:<BR> Expected time:<BR> Means of arrival:<BR> Comments:<BR> Decreased LOC

## 2012-08-08 NOTE — H&P (Addendum)
[facility] History and Physical  Tony Boyd ZOX:[id number] DOB: 1955-09-10 DOA: 08/08/2012  Referring physician: Dr. Denton Lank PCP: [name], DO  Specialists: none  Chief Complaint: confusion  HPI: Tony Boyd is a 57 y.o. male  With history of alcoholic liver cirrhosis as well as hepatitis C, seizure d/o, and hypothyroidism.  Much of the history is obtained from the ED PA as well as EMR and limited from patient due to his AMS.  He reports that his daughter brought him to the hospital. States that he has been confused for the last few days.  He is on lactulose at home but reports that at times he misses doses.  Patient presented to the ED with confusion and elevated ammonia. We have been consulted for admission evaluation and recommendations for hepatic encephalopathy.  Review of Systems: limited assessment due to AMS.    Past Medical History  Diagnosis Date  . Cirrhosis of liver   . Hepatitis C   . ETOH abuse   . Hypothyroid   . Psychosis   . Bipolar disorder   . Seizures   . Arthritis   . Back pain   . Coagulopathy     Hx of  . Thrombocytopenia     Hx of  . Pancytopenia     Hx of  . H/O hypokalemia   . Hyponatremia     Hx of  . Korsakoff psychosis   . H/O abdominal abscess   . H/O esophageal varices   . Metabolic encephalopathy   . Hepatic encephalopathy   . Urinary urgency   . H/O renal failure   . H/O ascites   . Altered mental status   . Anemia   . Ascites   . Shortness of breath   . Peripheral vascular disease   . GERD (gastroesophageal reflux disease)   . Thrombocytopenia, acquired 06/02/2012  . Unspecified hypothyroidism 06/02/2012  . Pain in joint, pelvic region and thigh   . Edema   . Obesity, unspecified   . Chest pain, unspecified   . Pneumonitis due to inhalation of food or vomitus   . Other convulsions   . Cellulitis and abscess of upper arm and forearm   . Chronic hepatitis C without mention of hepatic coma   . Hypopotassemia   .  Anemia, unspecified   . Other and unspecified coagulation defects   . Thrombocytopenia, unspecified   . Bipolar I disorder, most recent episode (or current) unspecified   . Unspecified psychosis   . Encephalopathy   . Esophageal varices without mention of bleeding   . Esophagitis, unspecified   . Abscess of liver(572.0)   . Chronic kidney disease, unspecified   . Lumbago   . Personal history of alcoholism   . Personal history of tobacco use, presenting hazards to health   . Cirrhosis of liver without mention of alcohol   . Pneumonitis due to inhalation of food or vomitus 09/19/2010  . Chronic hepatitis C without mention of hepatic coma   . Bipolar I disorder, most recent episode (or current) unspecified   . Unspecified psychosis   . Other ascites   . Personal history of tobacco use, presenting hazards to health   . Cirrhosis of liver without mention of alcohol   . Type 2 diabetes mellitus with diabetic neuropathy    Past Surgical History  Procedure Laterality Date  . Colostomy    . Cholecystectomy    . Small intestine surgery    . Arm surgery  left arm  . US guided drain placement in ventral hernia abscess  07/21/2010  . Multiple picc line placements    . Esophagogastroduodenoscopy  06/28/2011    Procedure: ESOPHAGOGASTRODUODENOSCOPY (EGD);  Surgeon: Louis Meckel, MD;  Location: [facility];  Service: Endoscopy;  Laterality: N/A;  . Esophagogastroduodenoscopy N/A 05/06/2012    Procedure: ESOPHAGOGASTRODUODENOSCOPY (EGD);  Surgeon: Louis Meckel, MD;  Location: [facility];  Service: Endoscopy;  Laterality: N/A;  . Esophageal banding N/A 05/06/2012    Procedure: ESOPHAGEAL BANDING;  Surgeon: Louis Meckel, MD;  Location: [facility];  Service: Endoscopy;  Laterality: N/A;   Social History:  reports that he quit smoking about 4 years ago. His smoking use included Cigarettes. He smoked 0.00 packs per day. He has never used smokeless tobacco. He reports that he does not  drink alcohol or use illicit drugs.  where does patient live--home, ALF, SNF? and with whom if at home? At home with family  Can patient participate in ADLs? yes  Allergies  Allergen Reactions  . Ativan (Lorazepam) Other (See Comments)    "hallucinations"  . Droperidol Other (See Comments)    unknown  . Ketorolac Other (See Comments)    unknown  . Penicillins Swelling and Other (See Comments)    unkown  . Toradol (Ketorolac Tromethamine) Hives    Family History  Problem Relation Age of Onset  . Heart disease Mother     MI  . Lung cancer Brother   None other reported  Prior to Admission medications   Medication Sig Start Date End Date Taking? Authorizing Provider  albuterol (PROVENTIL HFA;VENTOLIN HFA) 108 (90 BASE) MCG/ACT inhaler Inhale 2 puffs into the lungs 2 (two) times daily as needed for wheezing.    Yes Historical Provider, MD  ALPRAZolam Prudy Feeler) 1 MG tablet Take 1 mg by mouth 3 (three) times daily as needed for sleep or anxiety.   Yes Historical Provider, MD  ferrous sulfate 325 (65 FE) MG tablet Take 325 mg by mouth at bedtime.   Yes Historical Provider, MD  folic acid (FOLVITE) 1 MG tablet Take 1 mg by mouth daily.   Yes Historical Provider, MD  furosemide (LASIX) 80 MG tablet Take 80 mg by mouth 2 (two) times daily.   Yes Historical Provider, MD  lactulose (CHRONULAC) 10 GM/15ML solution Take 30 g by mouth 5 (five) times daily.   Yes Historical Provider, MD  levETIRAcetam (KEPPRA) 500 MG tablet Take 500 mg by mouth 3 (three) times daily.   Yes Historical Provider, MD  levothyroxine (SYNTHROID, LEVOTHROID) 75 MCG tablet Take 75 mcg by mouth daily.   Yes Historical Provider, MD  metFORMIN (GLUCOPHAGE) 1000 MG tablet Take 1,000 mg by mouth 2 (two) times daily. Dx 250.60 08/08/12  Yes Tiffany L Reed, DO  Multiple Vitamin (MULTIVITAMIN WITH MINERALS) TABS Take 1 tablet by mouth daily.   Yes Historical Provider, MD  omeprazole (PRILOSEC) 20 MG capsule Take 20 mg by mouth  daily.   Yes Historical Provider, MD  oxyCODONE (OXY IR/ROXICODONE) 5 MG immediate release tablet Take 5 mg by mouth every 4 (four) hours.   Yes Historical Provider, MD  potassium chloride (K-DUR) 10 MEQ tablet Take 10 mEq by mouth 2 (two) times daily.   Yes Historical Provider, MD  rifaximin (XIFAXAN) 550 MG TABS Take 550 mg by mouth 2 (two) times daily. 01/26/12  Yes Alison Murray, MD  risperiDONE (RISPERDAL) 0.25 MG tablet Take 0.25 mg by mouth 2 (two) times daily.  Yes Historical Provider, MD  spironolactone (ALDACTONE) 100 MG tablet Take 200 mg by mouth daily.   Yes Historical Provider, MD  metFORMIN (GLUCOPHAGE) 500 MG tablet Take 500 mg by mouth 2 (two) times daily with a meal. 10 DAYS SUPPLY FROM Emergency Room PATIENT COMPLETED 08/07/2012 07/26/12   Historical Provider, MD   Physical Exam: Filed Vitals:   08/08/12 1127 08/08/12 1413 08/08/12 1430  BP: 137/63 116/62 112/93  Pulse: 92  95  Temp: 97.6 F (36.4 C)    TempSrc: Oral    Resp: 16 15 22   SpO2: 95% 97% 95%     General:  Pt in NAD, Alert and Awake  Eyes: EOMI, PERRLA  ENT: normal exterior appearance, no masses on visual examination  Neck: supple, no goiter  Cardiovascular: RRR, no MRG  Respiratory: CTA BL, no wheezes  Abdomen: obese, seems distended,+ BS  Skin: warm and dry  Musculoskeletal: no cyanosis or clubbing   Psychiatric: Alert and Oriented to person, place but not time  Neurologic: moves all extremities, no facial asymmetry  Labs on Admission:  Basic Metabolic Panel:  Recent Labs Lab 08/08/12 1250  NA 125*  K 4.5  CL 89*  CO2 30  GLUCOSE 430*  BUN 14  CREATININE 0.81  CALCIUM 8.7   Liver Function Tests: No results found for this basename: AST, ALT, ALKPHOS, BILITOT, PROT, ALBUMIN,  in the last 168 hours No results found for this basename: LIPASE, AMYLASE,  in the last 168 hours  Recent Labs Lab 08/08/12 1330  AMMONIA 240*   CBC:  Recent Labs Lab 08/08/12 1250  WBC 4.4   NEUTROABS 3.4  HGB 11.9*  HCT 32.5*  MCV 100.6*  PLT 39*   Cardiac Enzymes: No results found for this basename: CKTOTAL, CKMB, CKMBINDEX, TROPONINI,  in the last 168 hours  BNP (last 3 results) No results found for this basename: PROBNP,  in the last 8760 hours CBG:  Recent Labs Lab 08/08/12 1203  GLUCAP 417*    Radiological Exams on Admission: No results found.  EKG: Independently reviewed. Sinus rhythm with no ST elevation or depressions  Assessment/Plan Active Problems:   1. Hepatic encephalopathy - Will continue home regimen rifaximin. - Suspect this is most likely due to non compliance with lactulose at home. - Will place order for Lactulose 30 ml po 1-2 hrs until 2-3 soft stools.  Then may discontinue and start home regimen the next day. - reassess next am. - Ammonia at 240 on admission  2. Thrombocytopenia - Likely due to liver cirrhosis - given thrombocytopenia will not start anticoagulants  3. Liver cirrhosis - Continue home regimen  4. Hyponatremia - patient was given normal saline in ED - Most likely due to liver cirrhosis as such will saline lock (suspect hypervolemia hyponatremia)  5. Seizure d/o - continue home regimen - stable, no seizure like activity reported  6.  Hypothyroidism - Continue home synthroid regimen - stable.  Addendum 7. DM - Not well controlled currently - BMP bicarb 30 - Will place on SSI (resistant) and night coverage - diabetic diet.  Code Status: full Family Communication: no family at bedside.  Disposition Plan: Pending improvement in mentation and ammonia levels.  Time spent: > 60 minutes  Penny Pia [facility] Pager [phone]  If 7PM-7AM, please contact night-coverage [email or web address] Password [facility] 08/08/2012, 3:24 PM

## 2012-08-08 NOTE — ED Notes (Signed)
Attempted IV start and was unsuccessful.  Charge attempted IV start and was also unsuccessful.  IV rn paged.

## 2012-08-08 NOTE — ED Notes (Signed)
PER EMS- pt picked up from [place] with c/o weakness and fatigue xseveral months.  Daughter reports pt is not acting himself.  Pts baseline is ambulatory, up and active. CBG is 427.  Pt hasn't been diagnosed with DM.  Pt is alert, but lethargic.  Pt hx liver diease, seizures and anxiety.

## 2012-08-08 NOTE — ED Notes (Signed)
IV rn bedside 

## 2012-08-09 ENCOUNTER — Encounter: Payer: Self-pay | Admitting: Internal Medicine

## 2012-08-09 DIAGNOSIS — E119 Type 2 diabetes mellitus without complications: Secondary | ICD-10-CM

## 2012-08-09 DIAGNOSIS — K7460 Unspecified cirrhosis of liver: Secondary | ICD-10-CM

## 2012-08-09 DIAGNOSIS — F1010 Alcohol abuse, uncomplicated: Secondary | ICD-10-CM

## 2012-08-09 DIAGNOSIS — K7290 Hepatic failure, unspecified without coma: Secondary | ICD-10-CM

## 2012-08-09 LAB — CBC WITH DIFFERENTIAL/PLATELET
Basophils Absolute: 0 10*3/uL (ref 0.0–0.1)
Basophils Relative: 0 % (ref 0–1)
Eosinophils Absolute: 0 10*3/uL (ref 0.0–0.7)
Eosinophils Relative: 1 % (ref 0–5)
HCT: 32.5 % — ABNORMAL LOW (ref 39.0–52.0)
Hemoglobin: 11.9 g/dL — ABNORMAL LOW (ref 13.0–17.0)
Lymphocytes Relative: 9 % — ABNORMAL LOW (ref 12–46)
Lymphs Abs: 0.4 10*3/uL — ABNORMAL LOW (ref 0.7–4.0)
MCH: 36.8 pg — ABNORMAL HIGH (ref 26.0–34.0)
MCHC: 36.6 g/dL — ABNORMAL HIGH (ref 30.0–36.0)
MCV: 100.6 fL — ABNORMAL HIGH (ref 78.0–100.0)
Monocytes Absolute: 0.6 10*3/uL (ref 0.1–1.0)
Monocytes Relative: 14 % — ABNORMAL HIGH (ref 3–12)
Neutro Abs: 3.4 10*3/uL (ref 1.7–7.7)
Neutrophils Relative %: 76 % (ref 43–77)
Platelets: 39 10*3/uL — ABNORMAL LOW (ref 150–400)
RBC: 3.23 MIL/uL — ABNORMAL LOW (ref 4.22–5.81)
RDW: 15.2 % (ref 11.5–15.5)
WBC: 4.4 10*3/uL (ref 4.0–10.5)

## 2012-08-09 LAB — CBC
Hemoglobin: 11.2 g/dL — ABNORMAL LOW (ref 13.0–17.0)
MCHC: 35.1 g/dL (ref 30.0–36.0)
RDW: 15.9 % — ABNORMAL HIGH (ref 11.5–15.5)

## 2012-08-09 LAB — COMPREHENSIVE METABOLIC PANEL
ALT: 41 U/L (ref 0–53)
AST: 64 U/L — ABNORMAL HIGH (ref 0–37)
Albumin: 2 g/dL — ABNORMAL LOW (ref 3.5–5.2)
Alkaline Phosphatase: 232 U/L — ABNORMAL HIGH (ref 39–117)
Glucose, Bld: 314 mg/dL — ABNORMAL HIGH (ref 70–99)
Potassium: 3.7 mEq/L (ref 3.5–5.1)
Sodium: 123 mEq/L — ABNORMAL LOW (ref 135–145)
Total Protein: 6.7 g/dL (ref 6.0–8.3)

## 2012-08-09 LAB — GLUCOSE, CAPILLARY
Glucose-Capillary: 340 mg/dL — ABNORMAL HIGH (ref 70–99)
Glucose-Capillary: 328 mg/dL — ABNORMAL HIGH (ref 70–99)
Glucose-Capillary: 347 mg/dL — ABNORMAL HIGH (ref 70–99)

## 2012-08-09 MED ORDER — GABAPENTIN 300 MG PO CAPS
300.0000 mg | ORAL_CAPSULE | Freq: Two times a day (BID) | ORAL | Status: DC
  Administered 2012-08-09 – 2012-08-12 (×6): 300 mg via ORAL
  Filled 2012-08-09 (×8): qty 1

## 2012-08-09 MED ORDER — LORAZEPAM 1 MG PO TABS
1.0000 mg | ORAL_TABLET | Freq: Four times a day (QID) | ORAL | Status: AC | PRN
  Administered 2012-08-09 – 2012-08-12 (×10): 1 mg via ORAL
  Filled 2012-08-09 (×10): qty 1

## 2012-08-09 MED ORDER — THIAMINE HCL 100 MG/ML IJ SOLN
100.0000 mg | Freq: Every day | INTRAMUSCULAR | Status: DC
  Filled 2012-08-09 (×5): qty 1

## 2012-08-09 MED ORDER — LORAZEPAM 2 MG/ML IJ SOLN
1.0000 mg | Freq: Four times a day (QID) | INTRAMUSCULAR | Status: AC | PRN
  Administered 2012-08-10: 1 mg via INTRAVENOUS
  Filled 2012-08-09: qty 1

## 2012-08-09 MED ORDER — ADULT MULTIVITAMIN W/MINERALS CH
1.0000 | ORAL_TABLET | Freq: Every day | ORAL | Status: DC
  Administered 2012-08-09 – 2012-08-13 (×5): 1 via ORAL
  Filled 2012-08-09 (×6): qty 1

## 2012-08-09 MED ORDER — VITAMIN B-1 100 MG PO TABS
100.0000 mg | ORAL_TABLET | Freq: Every day | ORAL | Status: DC
  Administered 2012-08-09 – 2012-08-13 (×5): 100 mg via ORAL
  Filled 2012-08-09 (×6): qty 1

## 2012-08-09 NOTE — Progress Notes (Signed)
[facility] PROGRESS NOTE  Tony Boyd OZH:[id number] DOB: Apr 09, 1955 DOA: 08/08/2012 PCP: [name], DO  Assessment/Plan: Hepatic encephalopathy -Due to the patient noncompliance with lactulose -Patient has been missing 2-3 doses daily -Gradually improving on lactulose in-house -Ammonia has improved to 102 -Continue rifaximin Diabetes mellitus type 2, uncontrolled -Poorly controlled -Hemoglobin A1c 10.1 on 07/31/12 -Patient is a poor candidate for insulin at this time given his noncompliance and cognitive deficits from his hepatic encephalopathy and his living situation--this was discussed with the patient's daughter -Risks, benefits, and alternatives were discussed with the patient's daughter regarding starting insulin -she prefers oral therapy at this time with the understanding that this is suboptimal treatment giving the patient's current living situation -Continue NovoLog sliding scale while in hospital Hyponatremia -Secondary to patient's liver cirrhosis -Continue diuresis with furosemide and spironolactone -Check TSH -Baseline sodium 125-129 Thrombocytopenia -Secondary to patient's cirrhosis -No active bleeding, clinically stable at this time End-stage liver disease -Patient is a not a candidate for transplantation at this time due to continued alcohol usage Seizure disorder -Continue Keppra -No active seizures at this time Alcohol use -Patient continues to drink alcohol 2-3 times per week -Abstinence discussed -Last drink was 08/07/2012 -CIWA protocol   Family Communication:   Updated daughter Disposition Plan:   Home when medically stable      Procedures/Studies: Dg Cervical Spine Complete  07/25/2012   *RADIOLOGY REPORT*  Clinical Data: Larey Seat from a motorized chair.  Left sided neck pain.  CERVICAL SPINE - COMPLETE 4+ VIEW  Comparison: Cervical spine CT 10/23/2009.  Findings: Anatomic alignment.  No visible fractures.  Disc spaces well preserved.   Calcification in the anterior annular fibers at C4- 5 and C5-6, unchanged.  Normal prevertebral soft tissues.  Facet joints intact.  No significant bony foraminal stenoses.  No static evidence of instability.  Calcification in the nuchal ligament posterior to C6.  IMPRESSION: No evidence of fracture or static signs of instability.  No significant abnormality.   Original Report Authenticated By: Hulan Saas, M.D.   Dg Clavicle Left  07/25/2012   *RADIOLOGY REPORT*  Clinical Data: Larey Seat from a motorized chair, landing on the left shoulder.  LEFT CLAVICLE - 2+ VIEWS  Comparison: No prior clavicle imaging.  Visualized left clavicle and chest x-ray 04/16/2012.  Findings: No fractures identified involving the clavicle. Acromioclavicular joint intact with mild to moderate degenerative changes.  IMPRESSION: No acute osseous abnormality.  Degenerative changes in the [facility] joint.   Original Report Authenticated By: Hulan Saas, M.D.   Ct Angio Neck W/cm &/or Wo/cm  07/25/2012   *RADIOLOGY REPORT*  Clinical Data:  Fall.  Hematoma in the left neck.  CT NECK WITHOUT CONTRAST  Technique:  Multidetector CT imaging of the neck was performed following the standard protocol without intravenous contrast.  Comparison:  CT head without contrast 06/01/2012  Findings:  There is a common origin of the innominate artery and the left common carotid artery.  Both vertebral arteries originate from the subclavian arteries.  The right vertebral artery is the dominant vessel.  There are no significant stenoses in the neck. The PICA origins are visualized and normal.  The basilar artery is within normal limits.  Both posterior cerebral arteries originate from the basilar tip.  The right common carotid artery is within normal limits.  The right carotid bifurcation is normal.  There is moderate tortuosity of the high cervical right ICA without a significant stenosis.  Minimal atherosclerotic calcifications are present within the cavernous  segment of the  right internal carotid artery without a significant stenosis.  The left common carotid artery is mildly tortuous.  The left carotid bifurcation is within normal limits.  Moderate tortuosity is noted in the high cervical left ICA.  The intracranial left ICA demonstrates mild atherosclerotic calcifications at the cavernous segment without a significant stenosis.  The A1 and M1 segments are normal.  The anterior communicating artery is patent.  The carotid bifurcations are unremarkable.  The visualized branch vessels demonstrate mild segmental irregularity without a significant proximal stenosis, aneurysm, or occlusion.  IMPRESSION:  1.  Moderate tortuosity of the cervical internal carotid arteries bilaterally. 2.  No significant carotid artery disease. 3.  Tortuosity of the proximal vertebral arteries bilaterally without a significant stenosis.  The right vertebral artery is the dominant vessel. 4.  Mild distal intracranial small vessel disease.   Original Report Authenticated By: Marin Roberts, M.D.         Subjective: Patient is more lucid there remains pleasantly confused. Denies any fevers, chills, chest pain, shortness breath, abdominal pain, nausea, vomiting, diarrhea, dysuria.  Objective: Filed Vitals:   08/08/12 1530 08/08/12 1557 08/08/12 2223 08/09/12 0519  BP: 119/58 142/57 150/80 131/60  Pulse: 92 90 89 91  Temp:  97.7 F (36.5 C) 98 F (36.7 C) 98.2 F (36.8 C)  TempSrc:  Oral Oral Oral  Resp: 16 18 16 16   Height:  5\' 8"  (1.727 m)    Weight:  127.007 kg (280 lb)    SpO2: 99% 99% 98% 99%    Intake/Output Summary (Last 24 hours) at 08/09/12 1300 Last data filed at 08/09/12 1100  Gross per 24 hour  Intake   1440 ml  Output   2400 ml  Net   -960 ml   Weight change:  Exam:   General:  Pt is alert, follows commands appropriately, not in acute distress  HEENT: No icterus, No thrush, [place]/AT  Cardiovascular: RRR, S1/S2, no rubs, no  gallops  Respiratory: Basilar crackles. No wheezes or rhonchi. Good air movement.  Abdomen: Soft/+BS, non tender, non distended, no guarding  Extremities: 2+ lower extremity edema, No lymphangitis, No petechiae, No rashes, no synovitis  Data Reviewed: Basic Metabolic Panel:  Recent Labs Lab 08/08/12 1250 08/09/12 0520  NA 125* 123*  K 4.5 3.7  CL 89* 88*  CO2 30 28  GLUCOSE 430* 314*  BUN 14 12  CREATININE 0.81 0.71  CALCIUM 8.7 8.5   Liver Function Tests:  Recent Labs Lab 08/09/12 0520  AST 64*  ALT 41  ALKPHOS 232*  BILITOT 4.9*  PROT 6.7  ALBUMIN 2.0*   No results found for this basename: LIPASE, AMYLASE,  in the last 168 hours  Recent Labs Lab 08/08/12 1330 08/09/12 0520  AMMONIA 240* 102*   CBC:  Recent Labs Lab 08/08/12 1250 08/09/12 0520  WBC 4.4 4.1  NEUTROABS 3.4  --   HGB 11.9* 11.2*  HCT 32.5* 31.9*  MCV 100.6* 102.2*  PLT 39* 32*   Cardiac Enzymes: No results found for this basename: CKTOTAL, CKMB, CKMBINDEX, TROPONINI,  in the last 168 hours BNP: No components found with this basename: POCBNP,  CBG:  Recent Labs Lab 08/08/12 1203 08/08/12 1606 08/08/12 2220 08/09/12 0726 08/09/12 1140  GLUCAP 417* 338* 292* 299* 340*    No results found for this or any previous visit (from the past 240 hour(s)).   Scheduled Meds: . ferrous sulfate  325 mg Oral QHS  . folic acid  1 mg Oral Daily  .  furosemide  80 mg Oral BID WC  . insulin aspart  0-20 Units Subcutaneous TID WC  . insulin aspart  0-5 Units Subcutaneous QHS  . lactulose  10 g Oral Q2H  . levETIRAcetam  500 mg Oral TID  . levothyroxine  75 mcg Oral QAC breakfast  . pantoprazole  40 mg Oral Daily  . potassium chloride  10 mEq Oral BID WC  . rifaximin  550 mg Oral BID  . risperiDONE  0.25 mg Oral BID  . sodium chloride  3 mL Intravenous Q12H  . spironolactone  200 mg Oral Daily   Continuous Infusions:    Janett Kamath, DO  [facility] Pager [phone]  If  7PM-7AM, please contact night-coverage [email or web address] Password [email or web address] 08/09/2012, 1:00 PM   LOS: 1 day

## 2012-08-09 NOTE — Progress Notes (Signed)
Clinical Social Work Department BRIEF PSYCHOSOCIAL ASSESSMENT 08/09/2012  Patient:  Tony Boyd, Tony Boyd     Account Number:  [id number]     Admit date:  08/08/2012  Clinical Social Worker:  Doroteo Glassman  Date/Time:  08/09/2012 02:36 PM  Referred by:  Physician  Date Referred:  08/09/2012 Referred for  ALF Placement   Other Referral:   Interview type:  Patient Other interview type:    PSYCHOSOCIAL DATA Living Status:  ALONE Admitted from facility:   Level of care:   Primary support name:  [name] Primary support relationship to patient:  CHILD, ADULT Degree of support available:   strong    CURRENT CONCERNS Current Concerns  Post-Acute Placement   Other Concerns:    SOCIAL WORK ASSESSMENT / PLAN Met with Pt to discuss d/c plans, as MD placed a CSW consult for ALF placement.    Pt was alert and oriented.  CSW if familiar with Pt from prior admissions.    Pt stated that he intends to return to his IL at [facility] and that he knows that he needs to take better care of himself.  Pt stated that his daughter comes by in the evenings and gets his medications ready for him.  He admits, however, that he sometimes forgets to take his meds.  Pt stated that he knows it's up to him to do for himself and he intends to "do better," with regard to his health.    CSW thanked Pt for his time.    Notified MD of Pt's plans.    No further CSW needs identified, at this time.    CSW to sign off.   Assessment/plan status:  No Further Intervention Required Other assessment/ plan:   Information/referral to community resources:   n/a    PATIENT'S/FAMILY'S RESPONSE TO PLAN OF CARE: Pt thanked CSW for time and assistance.   Providence Crosby, LCSWA Clinical Social Work [phone]

## 2012-08-10 DIAGNOSIS — E118 Type 2 diabetes mellitus with unspecified complications: Secondary | ICD-10-CM

## 2012-08-10 DIAGNOSIS — IMO0002 Reserved for concepts with insufficient information to code with codable children: Secondary | ICD-10-CM | POA: Diagnosis present

## 2012-08-10 DIAGNOSIS — D689 Coagulation defect, unspecified: Secondary | ICD-10-CM

## 2012-08-10 LAB — COMPREHENSIVE METABOLIC PANEL
ALT: 50 U/L (ref 0–53)
AST: 80 U/L — ABNORMAL HIGH (ref 0–37)
Calcium: 8.5 mg/dL (ref 8.4–10.5)
GFR calc Af Amer: >90 mL/min (ref >90)
Sodium: 119 mEq/L — CL (ref 135–145)
Total Protein: 7 g/dL (ref 6.0–8.3)

## 2012-08-10 LAB — GLUCOSE, CAPILLARY: Glucose-Capillary: 345 mg/dL — ABNORMAL HIGH (ref 70–99)

## 2012-08-10 LAB — OSMOLALITY: Osmolality: 278 mOsm/kg (ref 275–300)

## 2012-08-10 MED ORDER — INSULIN DETEMIR 100 UNIT/ML ~~LOC~~ SOLN
25.0000 [IU] | Freq: Every day | SUBCUTANEOUS | Status: DC
  Filled 2012-08-10: qty 0.25

## 2012-08-10 MED ORDER — INSULIN DETEMIR 100 UNIT/ML ~~LOC~~ SOLN
20.0000 [IU] | Freq: Every day | SUBCUTANEOUS | Status: DC
  Administered 2012-08-10: 20 [IU] via SUBCUTANEOUS
  Filled 2012-08-10 (×2): qty 0.2

## 2012-08-10 MED ORDER — OXYCODONE HCL 5 MG PO TABS
10.0000 mg | ORAL_TABLET | Freq: Once | ORAL | Status: AC
  Administered 2012-08-10: 10 mg via ORAL
  Filled 2012-08-10: qty 1

## 2012-08-10 MED ORDER — LACTULOSE 10 GM/15ML PO SOLN
30.0000 g | ORAL | Status: DC
  Administered 2012-08-10 – 2012-08-11 (×6): 30 g via ORAL
  Filled 2012-08-10 (×12): qty 45

## 2012-08-10 NOTE — Care Management (Signed)
   CARE MANAGEMENT NOTE 08/10/2012  Patient:  Tony Boyd, Tony Boyd   Account Number:  [id number]  Date Initiated:  08/10/2012  Documentation initiated by:  [name]  Subjective/Objective Assessment:   hepatic encephalopathy     Action/Plan:   home with home health   Anticipated DC Date:  08/11/2012   Anticipated DC Plan:  HOME W HOME HEALTH SERVICES  In-house referral  Clinical Social Worker      DC Planning Services  CM consult      [facility] Choice  HOME HEALTH   Choice offered to / List presented to:  [name]           Status of service:  In process, will continue to follow Medicare Important Message given?   (If response is "NO", the following Medicare IM given date fields will be blank) Date Medicare IM given:   Date Additional Medicare IM given:    Discharge Disposition:  HOME W HOME HEALTH SERVICES  Per UR Regulation:    If discussed at Long Length of Stay Meetings, dates discussed:    Comments:  08/10/12 Tony Boyd [phone] Received referral for home health services. Spoke with patient at bedside to offer choice. States he has had [facility] in the past and does not wish to have them this time. States he wants to talk it over with his daughter this evening when she comes to visit him. Left home health agency list at bedside with patient. He states he will know tomorrow on which agency he chooses. Will ask weekday CM to follow up on patient's choice. Tony Boyd,RNCM

## 2012-08-10 NOTE — Evaluation (Signed)
Physical Therapy Evaluation Patient Details Name: Tony Boyd MRN: [id number] DOB: March 31, 1955 Today's Date: 08/10/2012 Time: 0933-1000 PT Time Calculation (min): 27 min  PT Assessment / Plan / Recommendation Clinical Impression  57 yo male admitted with hepatic encephalopathy. On eval, pt required Min guard-Min assist for mobility-able to ambulate ~200 feet without assistive device although unsteady. Pt is from ILF and was modified ind prior to admission. Pt states he plans to return to ind living. Recommend HHPT,home health aide, assist from family if possible. Pt reports he was still using walker for ambulation even with L UE in sling. Pt has declined ALF placement this admission. Do not feel he will agree to ST rehab at [facility] either.     PT Assessment  Patient needs continued PT services    Follow Up Recommendations  Home health PT; home health aide; Supervision/assist for mobility/OOB    Does the patient have the potential to tolerate intense rehabilitation      Barriers to Discharge        Equipment Recommendations  None recommended by PT    Recommendations for Other Services OT consult   Frequency Min 3X/week    Precautions / Restrictions Precautions Precautions: Fall;Shoulder Type of Shoulder Precautions: sling L UE. Pt reports having clavicle fx after fall ~5 days ago Restrictions Weight Bearing Restrictions: Yes Other Position/Activity Restrictions: None officially in chart. but encouraging pt to avoid wBing/using L UE .   Pertinent Vitals/Pain 7/10 L UE when moved while readjusting sling      Mobility  Bed Mobility Bed Mobility: Supine to Sit Supine to Sit: HOB elevated;4: Min guard Transfers Transfers: Sit to Stand;Stand to Sit Sit to Stand: 4: Min guard;From bed;From chair/3-in-1 Stand to Sit: 4: Min guard;To chair/3-in-1 Details for Transfer Assistance: VCS safety, technique, hand placement.  Ambulation/Gait Ambulation/Gait Assistance: 4: Min  assist Ambulation Distance (Feet): 200 Feet Assistive device: Small based quad cane;None Ambulation/Gait Assistance Details: Attempted use of quad cane but pt's pace too fast. Transitioned to no assistive device-pt is unsteady . Pt reports he has still been using rollator despite L UE being in sling.  Gait Pattern: Step-through pattern;Decreased stride length    Exercises     PT Diagnosis: Difficulty walking;Generalized weakness;Acute pain  PT Problem List: Decreased mobility;Obesity;Pain;Decreased activity tolerance;Decreased balance;Decreased strength PT Treatment Interventions: DME instruction;Gait training;Functional mobility training;Therapeutic activities;Therapeutic exercise;Balance training;Patient/family education   PT Goals Acute Rehab PT Goals PT Goal Formulation: With patient Time For Goal Achievement: 08/24/12 Pt will go Supine/Side to Sit: with modified independence PT Goal: Supine/Side to Sit - Progress: Goal set today Pt will go Sit to Supine/Side: with modified independence PT Goal: Sit to Supine/Side - Progress: Goal set today Pt will go Sit to Stand: with modified independence PT Goal: Sit to Stand - Progress: Goal set today Pt will Ambulate: 51 - 150 feet;with modified independence;with least restrictive assistive device PT Goal: Ambulate - Progress: Goal set today  Visit Information  Last PT Received On: 08/10/12 Assistance Needed: +1    Subjective Data  Subjective: I need to use the bathroom. I wanna walk Patient Stated Goal: return to ILF   Prior Functioning  Home Living Lives With: Alone Available Help at Discharge: Family (occasionally checks on/assists) Type of Home: Independent living facility Home Access: Level entry Home Layout: One level Home Adaptive Equipment: Walker - four wheeled;Straight cane Prior Function Level of Independence: Independent with assistive device(s) Communication Communication: No difficulties    Cognition   Cognition Arousal/Alertness: Awake/alert Behavior During  Therapy: WFL for tasks assessed/performed Overall Cognitive Status: Within Functional Limits for tasks assessed    Extremity/Trunk Assessment Right Lower Extremity Assessment RLE ROM/Strength/Tone: Deficits RLE ROM/Strength/Tone Deficits: Strength at least 4/5 during functional mobility Left Lower Extremity Assessment LLE ROM/Strength/Tone: Deficits LLE ROM/Strength/Tone Deficits: Strength at least 4/5 during functional mobility   Balance Balance Balance Assessed: Yes Static Standing Balance Static Standing - Balance Support: No upper extremity supported Static Standing - Level of Assistance: 5: Stand by assistance Dynamic Standing Balance Dynamic Standing - Balance Support: No upper extremity supported Dynamic Standing - Level of Assistance: 4: Min assist;5: Stand by assistance  End of Session PT - End of Session Equipment Utilized During Treatment: Gait belt Activity Tolerance: Patient tolerated treatment well Patient left: in bed;with call bell/phone within reach (pt declined sitting in recliner) Nurse Communication: Mobility status  GP Functional Assessment Tool Used: clinical judgement Functional Limitation: Mobility: Walking and moving around Mobility: Walking and Moving Around Current Status [phone]): At least 1 percent but less than 20 percent impaired, limited or restricted Mobility: Walking and Moving Around Goal Status [phone]): 0 percent impaired, limited or restricted   Rebeca Alert, MPT Pager: [phone]

## 2012-08-10 NOTE — Progress Notes (Signed)
Patient was able to get up and use the [facility] and had a large soft bowl movement.  Patient has hyperactive bowel sounds but has no complaints.  Will continue to monitor.  Ernesta Amble, RN

## 2012-08-10 NOTE — Progress Notes (Addendum)
[facility] PROGRESS NOTE  Tony Boyd ZOX:[id number] DOB: 09/14/1955 DOA: 08/08/2012 PCP: [name], DO  Assessment/Plan: Hepatic encephalopathy  -Due to the patient noncompliance with lactulose  -Patient has been missing 2-3 doses daily  -Gradually improving on lactulose in-house  -Ammonia has improved to 240-->82 -Continue rifaximin  -Patient only had 1 bowel movement yesterday -Change lactulose to 30 g every 4 hours Diabetes mellitus type 2, uncontrolled  -Poorly controlled  -Hemoglobin A1c 10.1 on 07/31/12  -Patient is a poor candidate for insulin at this time given his noncompliance and intermitten delirium from his hepatic encephalopathy--this was discussed with the patient's daughter  -Risks, benefits, and alternatives were discussed with the patient's daughter regarding starting insulin  -she prefers oral therapy at this time with the understanding that this is suboptimal treatment giving the patient's current living situation  -Continue NovoLog sliding scale while in hospital  -Long discussion with the patient today. He feels confident that he is able to manage his own insulin at home -Consult psychiatry for capacity evaluation regarding placement and medical management -Start Levemir 20 units daily -will ask diabetic coordinator to provide education Hyponatremia  -Secondary to patient's liver cirrhosis  -Partly due to pseudohyponatremia from the patient's elevated glucose -Continue diuresis with furosemide and spironolactone  -Check TSH  -Baseline sodium 125-129  -Fluid restrict 1500 cc per 24 hours -Patient drank over 3 L of fluid yesterday -Check serum osmolality and urine osmolality Thrombocytopenia  -Secondary to patient's cirrhosis  -No active bleeding, clinically stable at this time  End-stage liver disease  -Patient is a not a candidate for transplantation at this time due to continued alcohol usage  Seizure disorder  -Continue Keppra  -No active  seizures at this time  Alcohol use  -Patient continues to drink alcohol 2 times per week  -Abstinence discussed  -Last drink was 08/07/2012  -CIWA protocol  Family Communication:   Pt at beside Disposition Plan:   Home when medically stable       Procedures/Studies: Dg Cervical Spine Complete  07/25/2012   *RADIOLOGY REPORT*  Clinical Data: Larey Seat from a motorized chair.  Left sided neck pain.  CERVICAL SPINE - COMPLETE 4+ VIEW  Comparison: Cervical spine CT 10/23/2009.  Findings: Anatomic alignment.  No visible fractures.  Disc spaces well preserved.  Calcification in the anterior annular fibers at C4- 5 and C5-6, unchanged.  Normal prevertebral soft tissues.  Facet joints intact.  No significant bony foraminal stenoses.  No static evidence of instability.  Calcification in the nuchal ligament posterior to C6.  IMPRESSION: No evidence of fracture or static signs of instability.  No significant abnormality.   Original Report Authenticated By: Hulan Saas, M.D.   Dg Clavicle Left  07/25/2012   *RADIOLOGY REPORT*  Clinical Data: Larey Seat from a motorized chair, landing on the left shoulder.  LEFT CLAVICLE - 2+ VIEWS  Comparison: No prior clavicle imaging.  Visualized left clavicle and chest x-ray 04/16/2012.  Findings: No fractures identified involving the clavicle. Acromioclavicular joint intact with mild to moderate degenerative changes.  IMPRESSION: No acute osseous abnormality.  Degenerative changes in the [facility] joint.   Original Report Authenticated By: Hulan Saas, M.D.   Ct Angio Neck W/cm &/or Wo/cm  07/25/2012   *RADIOLOGY REPORT*  Clinical Data:  Fall.  Hematoma in the left neck.  CT NECK WITHOUT CONTRAST  Technique:  Multidetector CT imaging of the neck was performed following the standard protocol without intravenous contrast.  Comparison:  CT head without contrast 06/01/2012  Findings:  There is a common origin of the innominate artery and the left common carotid artery.  Both  vertebral arteries originate from the subclavian arteries.  The right vertebral artery is the dominant vessel.  There are no significant stenoses in the neck. The PICA origins are visualized and normal.  The basilar artery is within normal limits.  Both posterior cerebral arteries originate from the basilar tip.  The right common carotid artery is within normal limits.  The right carotid bifurcation is normal.  There is moderate tortuosity of the high cervical right ICA without a significant stenosis.  Minimal atherosclerotic calcifications are present within the cavernous segment of the right internal carotid artery without a significant stenosis.  The left common carotid artery is mildly tortuous.  The left carotid bifurcation is within normal limits.  Moderate tortuosity is noted in the high cervical left ICA.  The intracranial left ICA demonstrates mild atherosclerotic calcifications at the cavernous segment without a significant stenosis.  The A1 and M1 segments are normal.  The anterior communicating artery is patent.  The carotid bifurcations are unremarkable.  The visualized branch vessels demonstrate mild segmental irregularity without a significant proximal stenosis, aneurysm, or occlusion.  IMPRESSION:  1.  Moderate tortuosity of the cervical internal carotid arteries bilaterally. 2.  No significant carotid artery disease. 3.  Tortuosity of the proximal vertebral arteries bilaterally without a significant stenosis.  The right vertebral artery is the dominant vessel. 4.  Mild distal intracranial small vessel disease.   Original Report Authenticated By: Marin Roberts, M.D.         Subjective: Patient is more lucid. He denies any fevers, chills, chest discomfort, shortness of breath, nausea, vomiting, diarrhea. Only had one bowel movement last night. No abdominal pain. No dysuria or dizziness.  Objective: Filed Vitals:   08/09/12 1401 08/09/12 2108 08/10/12 0430 08/10/12 0503  BP: 129/66  139/70 119/66 119/66  Pulse: 98 94 92 96  Temp: 98.4 F (36.9 C) 98.4 F (36.9 C)  97.3 F (36.3 C)  TempSrc: Oral Oral  Oral  Resp: 18 16  12   Height:      Weight:      SpO2: 99% 98%  98%    Intake/Output Summary (Last 24 hours) at 08/10/12 0901 Last data filed at 08/10/12 0803  Gross per 24 hour  Intake   3343 ml  Output   6125 ml  Net  -2782 ml   Weight change:  Exam:   General:  Pt is alert, follows commands appropriately, not in acute distress  HEENT: No icterus, No thrush,  Harmony/AT  Cardiovascular: RRR, S1/S2, no rubs, no gallops  Respiratory: Diminished breath sounds at the bases. No wheezes or rhonchi. Good air movement.  Abdomen: Soft/+BS, non tender, mild distended, no guarding  Extremities: 2+LE edema, No lymphangitis, No petechiae, No rashes, no synovitis  Data Reviewed: Basic Metabolic Panel:  Recent Labs Lab 08/08/12 1250 08/09/12 0520 08/10/12 0543  NA 125* 123* 119*  K 4.5 3.7 4.4  CL 89* 88* 85*  CO2 30 28 26   GLUCOSE 430* 314* 366*  BUN 14 12 12   CREATININE 0.81 0.71 0.81  CALCIUM 8.7 8.5 8.5   Liver Function Tests:  Recent Labs Lab 08/09/12 0520 08/10/12 0543  AST 64* 80*  ALT 41 50  ALKPHOS 232* 260*  BILITOT 4.9* 4.4*  PROT 6.7 7.0  ALBUMIN 2.0* 2.1*   No results found for this basename: LIPASE, AMYLASE,  in the last 168 hours  Recent Labs Lab  08/08/12 1330 08/09/12 0520 08/10/12 0543  AMMONIA 240* 102* 82*   CBC:  Recent Labs Lab 08/08/12 1250 08/09/12 0520  WBC 4.4 4.1  NEUTROABS 3.4  --   HGB 11.9* 11.2*  HCT 32.5* 31.9*  MCV 100.6* 102.2*  PLT 39* 32*   Cardiac Enzymes: No results found for this basename: CKTOTAL, CKMB, CKMBINDEX, TROPONINI,  in the last 168 hours BNP: No components found with this basename: POCBNP,  CBG:  Recent Labs Lab 08/09/12 0726 08/09/12 1140 08/09/12 1627 08/09/12 2106 08/10/12 0723  GLUCAP 299* 340* 328* 347* 345*    No results found for this or any previous visit  (from the past 240 hour(s)).   Scheduled Meds: . ferrous sulfate  325 mg Oral QHS  . folic acid  1 mg Oral Daily  . furosemide  80 mg Oral BID WC  . gabapentin  300 mg Oral BID  . insulin aspart  0-20 Units Subcutaneous TID WC  . insulin aspart  0-5 Units Subcutaneous QHS  . lactulose  30 g Oral Q4H  . levETIRAcetam  500 mg Oral TID  . levothyroxine  75 mcg Oral QAC breakfast  . multivitamin with minerals  1 tablet Oral Daily  . pantoprazole  40 mg Oral Daily  . potassium chloride  10 mEq Oral BID WC  . rifaximin  550 mg Oral BID  . risperiDONE  0.25 mg Oral BID  . sodium chloride  3 mL Intravenous Q12H  . spironolactone  200 mg Oral Daily  . thiamine  100 mg Oral Daily   Or  . thiamine  100 mg Intravenous Daily   Continuous Infusions:    Marabelle Cushman, DO  [facility] Pager [phone]  If 7PM-7AM, please contact night-coverage [email or web address] Password [email or web address] 08/10/2012, 9:01 AM   LOS: 2 days

## 2012-08-10 NOTE — Progress Notes (Signed)
Patient requesting Ativan throughout the night for anxiety.  He stated he has PTSD and always feels like someone is after him. Passed information along to day shift nurse.  She will continue to monitor.  Ernesta Amble, RN

## 2012-08-10 NOTE — Consult Note (Signed)
Reason for Consult: capacity? Referring Physician: unknown  Tony Boyd is an 57 y.o. male.  HPI:   57 y.o. Male, history of alcoholic liver cirrhosis as well as hepatitis C, seizure d/o, and hypothyroidism.  He reports that his daughter brought him to the hospital. States that he has been confused for the last few days. He is on lactulose at home but reports that at times he misses doses.  Patient presented to the ED with confusion and elevated ammonia.   Reports being close to his baseline. Following treatment recommendations. Reports drinking at times but not daily. Knows the consequences of drinking. Denies any depression or hallucinations. Reports current psy meds are helping. Talked with his family and they have no concern about his mental health or capacity at this time.   Past Medical History  Diagnosis Date  . Cirrhosis of liver   . Hepatitis C   . ETOH abuse   . Hypothyroid   . Psychosis   . Bipolar disorder   . Seizures   . Arthritis   . Back pain   . Coagulopathy     Hx of  . Thrombocytopenia     Hx of  . Pancytopenia     Hx of  . H/O hypokalemia   . Hyponatremia     Hx of  . Korsakoff psychosis   . H/O abdominal abscess   . H/O esophageal varices   . Metabolic encephalopathy   . Hepatic encephalopathy   . Urinary urgency   . H/O renal failure   . H/O ascites   . Altered mental status   . Anemia   . Ascites   . Shortness of breath   . Peripheral vascular disease   . GERD (gastroesophageal reflux disease)   . Thrombocytopenia, acquired 06/02/2012  . Unspecified hypothyroidism 06/02/2012  . Pain in joint, pelvic region and thigh   . Edema   . Obesity, unspecified   . Chest pain, unspecified   . Pneumonitis due to inhalation of food or vomitus   . Other convulsions   . Cellulitis and abscess of upper arm and forearm   . Chronic hepatitis C without mention of hepatic coma   . Hypopotassemia   . Anemia, unspecified   . Other and unspecified coagulation  defects   . Thrombocytopenia, unspecified   . Bipolar I disorder, most recent episode (or current) unspecified   . Unspecified psychosis   . Encephalopathy   . Esophageal varices without mention of bleeding   . Esophagitis, unspecified   . Abscess of liver(572.0)   . Chronic kidney disease, unspecified   . Lumbago   . Personal history of alcoholism   . Personal history of tobacco use, presenting hazards to health   . Cirrhosis of liver without mention of alcohol   . Pneumonitis due to inhalation of food or vomitus 09/19/2010  . Chronic hepatitis C without mention of hepatic coma   . Bipolar I disorder, most recent episode (or current) unspecified   . Unspecified psychosis   . Other ascites   . Personal history of tobacco use, presenting hazards to health   . Cirrhosis of liver without mention of alcohol   . Type 2 diabetes mellitus with diabetic neuropathy     Past Surgical History  Procedure Laterality Date  . Colostomy    . Cholecystectomy    . Small intestine surgery    . Arm surgery      left arm  . US guided drain placement in  ventral hernia abscess  07/21/2010  . Multiple picc line placements    . Esophagogastroduodenoscopy  06/28/2011    Procedure: ESOPHAGOGASTRODUODENOSCOPY (EGD);  Surgeon: Louis Meckel, MD;  Location: [facility];  Service: Endoscopy;  Laterality: N/A;  . Esophagogastroduodenoscopy N/A 05/06/2012    Procedure: ESOPHAGOGASTRODUODENOSCOPY (EGD);  Surgeon: Louis Meckel, MD;  Location: [facility];  Service: Endoscopy;  Laterality: N/A;  . Esophageal banding N/A 05/06/2012    Procedure: ESOPHAGEAL BANDING;  Surgeon: Louis Meckel, MD;  Location: [facility];  Service: Endoscopy;  Laterality: N/A;    Family History  Problem Relation Age of Onset  . Heart disease Mother     MI  . Lung cancer Brother     Social History:  reports that he quit smoking about 4 years ago. His smoking use included Cigarettes. He smoked 0.00 packs per day. He has  never used smokeless tobacco. He reports that he does not drink alcohol or use illicit drugs.  Allergies:  Allergies  Allergen Reactions  . Ativan (Lorazepam) Other (See Comments)    "hallucinations"  . Droperidol Other (See Comments)    unknown  . Ketorolac Other (See Comments)    unknown  . Penicillins Swelling and Other (See Comments)    unkown  . Toradol (Ketorolac Tromethamine) Hives    Medications: I have reviewed the patient's current medications.  Results for orders placed during the hospital encounter of 08/08/12 (from the past 48 hour(s))  GLUCOSE, CAPILLARY     Status: Abnormal   Collection Time    08/08/12 10:20 PM      Result Value Range   Glucose-Capillary 292 (*) 70 - 99 mg/dL   Comment 1 Documented in Chart     Comment 2 Notify RN    CBC     Status: Abnormal   Collection Time    08/09/12  5:20 AM      Result Value Range   WBC 4.1  4.0 - 10.5 K/uL   RBC 3.12 (*) 4.22 - 5.81 MIL/uL   Hemoglobin 11.2 (*) 13.0 - 17.0 g/dL   HCT 09.8 (*) 11.9 - 14.7 %   MCV 102.2 (*) 78.0 - 100.0 fL   MCH 35.9 (*) 26.0 - 34.0 pg   MCHC 35.1  30.0 - 36.0 g/dL   RDW 82.9 (*) 56.2 - 13.0 %   Platelets 32 (*) 150 - 400 K/uL   Comment: CONSISTENT WITH PREVIOUS RESULT  COMPREHENSIVE METABOLIC PANEL     Status: Abnormal   Collection Time    08/09/12  5:20 AM      Result Value Range   Sodium 123 (*) 135 - 145 mEq/L   Potassium 3.7  3.5 - 5.1 mEq/L   Comment: DELTA CHECK NOTED   Chloride 88 (*) 96 - 112 mEq/L   CO2 28  19 - 32 mEq/L   Glucose, Bld 314 (*) 70 - 99 mg/dL   BUN 12  6 - 23 mg/dL   Creatinine, Ser 8.65  0.50 - 1.35 mg/dL   Calcium 8.5  8.4 - 78.4 mg/dL   Total Protein 6.7  6.0 - 8.3 g/dL   Albumin 2.0 (*) 3.5 - 5.2 g/dL   AST 64 (*) 0 - 37 U/L   ALT 41  0 - 53 U/L   Alkaline Phosphatase 232 (*) 39 - 117 U/L   Total Bilirubin 4.9 (*) 0.3 - 1.2 mg/dL   GFR calc non Af Amer >90  >90 mL/min   GFR calc Af  Amer >90  >90 mL/min   Comment:            The eGFR has  been calculated     using the CKD EPI equation.     This calculation has not been     validated in all clinical     situations.     eGFR's persistently     <90 mL/min signify     possible Chronic Kidney Disease.  AMMONIA     Status: Abnormal   Collection Time    08/09/12  5:20 AM      Result Value Range   Ammonia 102 (*) 11 - 60 umol/L  GLUCOSE, CAPILLARY     Status: Abnormal   Collection Time    08/09/12  7:26 AM      Result Value Range   Glucose-Capillary 299 (*) 70 - 99 mg/dL  GLUCOSE, CAPILLARY     Status: Abnormal   Collection Time    08/09/12 11:40 AM      Result Value Range   Glucose-Capillary 340 (*) 70 - 99 mg/dL  GLUCOSE, CAPILLARY     Status: Abnormal   Collection Time    08/09/12  4:27 PM      Result Value Range   Glucose-Capillary 328 (*) 70 - 99 mg/dL  GLUCOSE, CAPILLARY     Status: Abnormal   Collection Time    08/09/12  9:06 PM      Result Value Range   Glucose-Capillary 347 (*) 70 - 99 mg/dL  COMPREHENSIVE METABOLIC PANEL     Status: Abnormal   Collection Time    08/10/12  5:43 AM      Result Value Range   Sodium 119 (*) 135 - 145 mEq/L   Comment: CRITICAL RESULT CALLED TO, READ BACK BY AND VERIFIED WITH:     MESSENHEIMER,V/4W @0622  ON 08/10/12 BY KARCZEWSKI,S.   Potassium 4.4  3.5 - 5.1 mEq/L   Chloride 85 (*) 96 - 112 mEq/L   CO2 26  19 - 32 mEq/L   Glucose, Bld 366 (*) 70 - 99 mg/dL   BUN 12  6 - 23 mg/dL   Creatinine, Ser 1.61  0.50 - 1.35 mg/dL   Calcium 8.5  8.4 - 09.6 mg/dL   Total Protein 7.0  6.0 - 8.3 g/dL   Albumin 2.1 (*) 3.5 - 5.2 g/dL   AST 80 (*) 0 - 37 U/L   ALT 50  0 - 53 U/L   Alkaline Phosphatase 260 (*) 39 - 117 U/L   Total Bilirubin 4.4 (*) 0.3 - 1.2 mg/dL   GFR calc non Af Amer >90  >90 mL/min   GFR calc Af Amer >90  >90 mL/min   Comment:            The eGFR has been calculated     using the CKD EPI equation.     This calculation has not been     validated in all clinical     situations.     eGFR's persistently      <90 mL/min signify     possible Chronic Kidney Disease.  AMMONIA     Status: Abnormal   Collection Time    08/10/12  5:43 AM      Result Value Range   Ammonia 82 (*) 11 - 60 umol/L  GLUCOSE, CAPILLARY     Status: Abnormal   Collection Time    08/10/12  7:23 AM      Result Value Range  Glucose-Capillary 345 (*) 70 - 99 mg/dL   Comment 1 Documented in Chart     Comment 2 Notify RN    OSMOLALITY     Status: None   Collection Time    08/10/12  9:06 AM      Result Value Range   Osmolality 278  275 - 300 mOsm/kg  OSMOLALITY, URINE     Status: Abnormal   Collection Time    08/10/12  9:24 AM      Result Value Range   Osmolality, Ur 276 (*) 390 - 1090 mOsm/kg  GLUCOSE, CAPILLARY     Status: Abnormal   Collection Time    08/10/12 11:44 AM      Result Value Range   Glucose-Capillary 376 (*) 70 - 99 mg/dL   Comment 1 Documented in Chart     Comment 2 Notify RN    GLUCOSE, CAPILLARY     Status: Abnormal   Collection Time    08/10/12  5:22 PM      Result Value Range   Glucose-Capillary 366 (*) 70 - 99 mg/dL   Comment 1 Documented in Chart     Comment 2 Notify RN      No results found.  ROS Blood pressure 141/65, pulse 101, temperature 98.6 F (37 C), temperature source Oral, resp. rate 18, height 5\' 8"  (1.727 m), weight 127.007 kg (280 lb), SpO2 95.00%. Physical Exam    Mental Status Examination/Evaluation:  Appearance: on bed  Eye Contact:: Good  Speech: normal  Volume: Normal  Mood: fine  Affect: ristricted  Thought Process: organized  Orientation: Full  Thought Content: NO AVH  Suicidal Thoughts: No  Homicidal Thoughts: no  Memory: fair  Judgement: Impaired  Insight: Lacking  Psychomotor Activity: Normal  Concentration: Fair  Recall: Fair  Akathisia: No  Assessment:  AXIS I: etoh dep, hx of bipolar d/o  AXIS II: Deferred  AXIS III: see emdical hx  ? ? ? ? ? ? ? ? ? ? ? ? ? ? ? AXIS IV: problems related to social environment,  problems with access to health care services and problems with primary support group  AXIS V: 50  ?  Treatment Plan/Recommendations:  1. Pt following treatment plan and not refusing anything now. Nursing and family has no concerns about him for capacity  2.will recommend psy out pt after f/u p  2. Will sign off. plz call again if needed  Wonda Cerise 08/10/2012, 9:21 PM

## 2012-08-11 ENCOUNTER — Ambulatory Visit: Payer: Self-pay | Admitting: Pharmacotherapy

## 2012-08-11 DIAGNOSIS — R188 Other ascites: Secondary | ICD-10-CM

## 2012-08-11 LAB — BASIC METABOLIC PANEL
CO2: 26 mEq/L (ref 19–32)
Calcium: 8.5 mg/dL (ref 8.4–10.5)
Glucose, Bld: 377 mg/dL — ABNORMAL HIGH (ref 70–99)
Sodium: 119 mEq/L — CL (ref 135–145)

## 2012-08-11 LAB — GLUCOSE, CAPILLARY
Glucose-Capillary: 341 mg/dL — ABNORMAL HIGH (ref 70–99)
Glucose-Capillary: 367 mg/dL — ABNORMAL HIGH (ref 70–99)
Glucose-Capillary: 274 mg/dL — ABNORMAL HIGH (ref 70–99)

## 2012-08-11 MED ORDER — LACTULOSE 10 GM/15ML PO SOLN
30.0000 g | Freq: Every day | ORAL | Status: DC
  Filled 2012-08-11 (×5): qty 45

## 2012-08-11 MED ORDER — GLUCOSE BLOOD VI STRP: ORAL_STRIP | Status: DC

## 2012-08-11 MED ORDER — INSULIN DETEMIR 100 UNIT/ML ~~LOC~~ SOLN
50.0000 [IU] | Freq: Every day | SUBCUTANEOUS | Status: DC
  Administered 2012-08-11 – 2012-08-12 (×2): 50 [IU] via SUBCUTANEOUS
  Filled 2012-08-11 (×3): qty 0.5

## 2012-08-11 MED ORDER — INSULIN PEN STARTER KIT
1.0000 | Freq: Once | Status: AC
  Administered 2012-08-11: 1
  Filled 2012-08-11: qty 1

## 2012-08-11 MED ORDER — ONETOUCH ULTRASOFT LANCETS MISC: Status: DC

## 2012-08-11 MED ORDER — LACTULOSE 10 GM/15ML PO SOLN
30.0000 g | Freq: Every day | ORAL | Status: DC
  Administered 2012-08-11 – 2012-08-13 (×12): 30 g via ORAL
  Filled 2012-08-11 (×16): qty 45

## 2012-08-11 MED ORDER — INSULIN ASPART 100 UNIT/ML ~~LOC~~ SOLN
5.0000 [IU] | Freq: Three times a day (TID) | SUBCUTANEOUS | Status: DC
  Administered 2012-08-11 – 2012-08-13 (×7): 5 [IU] via SUBCUTANEOUS

## 2012-08-11 MED ORDER — FREESTYLE SYSTEM KIT: 1.0000 | PACK | Status: DC | PRN

## 2012-08-11 NOTE — Progress Notes (Signed)
CRITICAL VALUE ALERT  Critical value received:  Na=119  Date of notification:  08/11/12  Time of notification:  04:28am  Critical value read back:yes  Nurse who received alert:  Tamala Bari  MD notified (1st page):  Dr C. Gherghe(via text)  Time of first page:  04:33am  MD notified (2nd page):  Time of second page:  Responding MD:  No reply since values the same as yesterday  Time MD responded:  06:47

## 2012-08-11 NOTE — Progress Notes (Signed)
[facility] PROGRESS NOTE  Tony Boyd ZOX:[id number] DOB: 09/18/1955 DOA: 08/08/2012 PCP: [name], DO  Assessment/Plan: Hepatic encephalopathy  -Due to the patient noncompliance with lactulose  -Patient has been missing 2-3 doses daily  -Gradually improving on lactulose in-house  -Ammonia has improved to 240-->82  -Continue rifaximin  -Change lactulose to 30 g every 4 hours -pt having 3-5 BM with adjustment of lactulose dose Diabetes mellitus type 2, uncontrolled  -Poorly controlled  -Hemoglobin A1c 10.1 on 07/31/12  -Patient is a poor candidate for insulin at this time given his noncompliance and intermitten delirium from his hepatic encephalopathy--this was discussed with the patient's daughter  -Risks, benefits, and alternatives were discussed with the patient's daughter regarding starting insulin  -she prefers oral therapy at this time with the understanding that this is suboptimal treatment giving the patient's current living situation  -Continue NovoLog sliding scale while in hospital  -Long discussion with the patient today. He feels confident that he is able to manage his own insulin at home  -Consult psychiatry for capacity evaluation regarding placement and medical management  -Increase to Levemir 50 units daily  -Home health PT, RN, home health aide were arranged for the patient prior to discharge-  Hyponatremia  -Secondary to patient's liver cirrhosis  -Partly due to pseudohyponatremia from the patient's elevated glucose  -Continue diuresis with furosemide and spironolactone  -Check TSH  -Baseline sodium 125-129  -Fluid restrict 1500 cc per 24 hours  -Patient drank over 3 L of fluid 08/09/12  -Check serum osmolality and urine osmolality--not suggestive of SIADH Thrombocytopenia/Coaguloapthy  -initial INR 1.7  -Secondary to patient's cirrhosis  -No active bleeding, clinically stable at this time  End-stage liver disease  -Patient is a not a candidate for  transplantation at this time due to continued alcohol usage  Seizure disorder  -Continue Keppra  -No active seizures at this time  Alcohol use  -Patient continues to drink alcohol 2 times per week  -Abstinence discussed  -Last drink was 08/07/2012  -CIWA protocol  Family Communication: Pt at beside  Disposition Plan: Home when medically stable        Procedures/Studies: Dg Cervical Spine Complete  07/25/2012   *RADIOLOGY REPORT*  Clinical Data: Larey Seat from a motorized chair.  Left sided neck pain.  CERVICAL SPINE - COMPLETE 4+ VIEW  Comparison: Cervical spine CT 10/23/2009.  Findings: Anatomic alignment.  No visible fractures.  Disc spaces well preserved.  Calcification in the anterior annular fibers at C4- 5 and C5-6, unchanged.  Normal prevertebral soft tissues.  Facet joints intact.  No significant bony foraminal stenoses.  No static evidence of instability.  Calcification in the nuchal ligament posterior to C6.  IMPRESSION: No evidence of fracture or static signs of instability.  No significant abnormality.   Original Report Authenticated By: Hulan Saas, M.D.   Dg Clavicle Left  07/25/2012   *RADIOLOGY REPORT*  Clinical Data: Larey Seat from a motorized chair, landing on the left shoulder.  LEFT CLAVICLE - 2+ VIEWS  Comparison: No prior clavicle imaging.  Visualized left clavicle and chest x-ray 04/16/2012.  Findings: No fractures identified involving the clavicle. Acromioclavicular joint intact with mild to moderate degenerative changes.  IMPRESSION: No acute osseous abnormality.  Degenerative changes in the [facility] joint.   Original Report Authenticated By: Hulan Saas, M.D.   Ct Angio Neck W/cm &/or Wo/cm  07/25/2012   *RADIOLOGY REPORT*  Clinical Data:  Fall.  Hematoma in the left neck.  CT NECK WITHOUT CONTRAST  Technique:  Multidetector  CT imaging of the neck was performed following the standard protocol without intravenous contrast.  Comparison:  CT head without contrast 06/01/2012   Findings:  There is a common origin of the innominate artery and the left common carotid artery.  Both vertebral arteries originate from the subclavian arteries.  The right vertebral artery is the dominant vessel.  There are no significant stenoses in the neck. The PICA origins are visualized and normal.  The basilar artery is within normal limits.  Both posterior cerebral arteries originate from the basilar tip.  The right common carotid artery is within normal limits.  The right carotid bifurcation is normal.  There is moderate tortuosity of the high cervical right ICA without a significant stenosis.  Minimal atherosclerotic calcifications are present within the cavernous segment of the right internal carotid artery without a significant stenosis.  The left common carotid artery is mildly tortuous.  The left carotid bifurcation is within normal limits.  Moderate tortuosity is noted in the high cervical left ICA.  The intracranial left ICA demonstrates mild atherosclerotic calcifications at the cavernous segment without a significant stenosis.  The A1 and M1 segments are normal.  The anterior communicating artery is patent.  The carotid bifurcations are unremarkable.  The visualized branch vessels demonstrate mild segmental irregularity without a significant proximal stenosis, aneurysm, or occlusion.  IMPRESSION:  1.  Moderate tortuosity of the cervical internal carotid arteries bilaterally. 2.  No significant carotid artery disease. 3.  Tortuosity of the proximal vertebral arteries bilaterally without a significant stenosis.  The right vertebral artery is the dominant vessel. 4.  Mild distal intracranial small vessel disease.   Original Report Authenticated By: Marin Roberts, M.D.         Subjective: Patient feels well. He denies any fevers, chills, chest discomfort, shortness of breath, nausea, vomiting, diarrhea, abdominal pain. Denies any dysuria or hematuria. His appetite is  good.  Objective: Filed Vitals:   08/10/12 0503 08/10/12 1323 08/10/12 2014 08/11/12 0609  BP: 119/66 136/62 141/65 128/61  Pulse: 96 99 101 98  Temp: 97.3 F (36.3 C) 98.4 F (36.9 C) 98.6 F (37 C) 98 F (36.7 C)  TempSrc: Oral Oral Oral Oral  Resp: 12 16 18 19   Height:      Weight:    123.1 kg (271 lb 6.2 oz)  SpO2: 98% 98% 95% 98%    Intake/Output Summary (Last 24 hours) at 08/11/12 1106 Last data filed at 08/11/12 0900  Gross per 24 hour  Intake    993 ml  Output   1925 ml  Net   -932 ml   Weight change:  Exam:   General:  Pt is alert, follows commands appropriately, not in acute distress  HEENT: No icterus, No thrush, No neck mass, [place]/AT  Cardiovascular: RRR, S1/S2, no rubs, no gallops  Respiratory: CTA bilaterally, no wheezing, no crackles, no rhonchi  Abdomen: Soft/+BS, non tender, non distended, no guarding  Extremities: 2+ LE edema, No lymphangitis, No petechiae, No rashes, no synovitis  Data Reviewed: Basic Metabolic Panel:  Recent Labs Lab 08/08/12 1250 08/09/12 0520 08/10/12 0543 08/11/12 0356  NA 125* 123* 119* 119*  K 4.5 3.7 4.4 5.1  CL 89* 88* 85* 87*  CO2 30 28 26 26   GLUCOSE 430* 314* 366* 377*  BUN 14 12 12 12   CREATININE 0.81 0.71 0.81 0.79  CALCIUM 8.7 8.5 8.5 8.5   Liver Function Tests:  Recent Labs Lab 08/09/12 0520 08/10/12 0543  AST 64* 80*  ALT 41 50  ALKPHOS 232* 260*  BILITOT 4.9* 4.4*  PROT 6.7 7.0  ALBUMIN 2.0* 2.1*   No results found for this basename: LIPASE, AMYLASE,  in the last 168 hours  Recent Labs Lab 08/08/12 1330 08/09/12 0520 08/10/12 0543  AMMONIA 240* 102* 82*   CBC:  Recent Labs Lab 08/08/12 1250 08/09/12 0520  WBC 4.4 4.1  NEUTROABS 3.4  --   HGB 11.9* 11.2*  HCT 32.5* 31.9*  MCV 100.6* 102.2*  PLT 39* 32*   Cardiac Enzymes: No results found for this basename: CKTOTAL, CKMB, CKMBINDEX, TROPONINI,  in the last 168 hours BNP: No components found with this basename: POCBNP,   CBG:  Recent Labs Lab 08/10/12 0723 08/10/12 1144 08/10/12 1722 08/10/12 2157 08/11/12 0702  GLUCAP 345* 376* 366* 336* 341*    No results found for this or any previous visit (from the past 240 hour(s)).   Scheduled Meds: . ferrous sulfate  325 mg Oral QHS  . folic acid  1 mg Oral Daily  . furosemide  80 mg Oral BID WC  . gabapentin  300 mg Oral BID  . insulin aspart  0-20 Units Subcutaneous TID WC  . insulin aspart  0-5 Units Subcutaneous QHS  . insulin aspart  5 Units Subcutaneous TID WC  . insulin detemir  50 Units Subcutaneous Daily  . Insulin Pen Starter Kit  1 kit Other Once  . lactulose  30 g Oral 5 X Daily  . levETIRAcetam  500 mg Oral TID  . levothyroxine  75 mcg Oral QAC breakfast  . multivitamin with minerals  1 tablet Oral Daily  . pantoprazole  40 mg Oral Daily  . potassium chloride  10 mEq Oral BID WC  . rifaximin  550 mg Oral BID  . risperiDONE  0.25 mg Oral BID  . sodium chloride  3 mL Intravenous Q12H  . spironolactone  200 mg Oral Daily  . thiamine  100 mg Oral Daily   Or  . thiamine  100 mg Intravenous Daily   Continuous Infusions:    Norva Bowe, DO  [facility] Pager [phone]  If 7PM-7AM, please contact night-coverage [email or web address] Password [email or web address] 08/11/2012, 11:06 AM   LOS: 3 days

## 2012-08-11 NOTE — Progress Notes (Signed)
Inpatient Diabetes Program Recommendations  AACE/ADA: New Consensus Statement on Inpatient Glycemic Control (2013)  Target Ranges:  Prepandial:   less than 140 mg/dL      Peak postprandial:   less than 180 mg/dL (1-2 hours)      Critically ill patients:  140 - 180 mg/dL      Results for Tony Boyd, Tony Boyd (MRN [id number]) as of 08/11/2012 15:17  Ref. Range 08/10/2012 07:23 08/10/2012 11:44 08/10/2012 17:22 08/10/2012 21:57  Glucose-Capillary Latest Range: 70-99 mg/dL 409 (H) 811 (H) 914 (H) 336 (H)    Results for Tony Boyd, Tony Boyd (MRN [id number]) as of 08/11/2012 15:17  Ref. Range 08/11/2012 07:02 08/11/2012 11:58  Glucose-Capillary Latest Range: 70-99 mg/dL 086 (H) 578 (H)    Patient admitted with hepatic encephalopathy.  Per records, he was supposed to be taking Metformin 1000 mg bid at home prior to admission.  Per MD notes, patient has been noncompliant with his lactulose and likely his DM medications as well.    Received referral to assist RNs in helping patient learn how to use insulin pen at home.  Attempted to teach patient how to use insulin pen.  Educated patient on insulin pen use at home.  Reviewed contents of insulin flexpen starter kit.  Reviewed all steps if insulin pen including attachment of needle, 2-unit air shot, dialing up dose, giving injection, removing needle, disposal of sharps, storage of unused insulin, disposal of insulin etc.    Patient Not really able to provide successful return demonstration.  Patient needed significant prompting and was not really able to follow my directions.  When attempting to dial up the insulin dose, patient insisted on trying to turn the barrel of the insulin pen and not the dial of the insulin pen as I instructed.  I attempted to redirect patient to turn the dial, but he kept insisting that he was turning the correct part of the pen.  After I removed the insulin pen from the patient's hands and re-oriented him to the components of the pen, patient was  able to dial up the correct dose but only with significant prompting.  Patient seemed extremely distracted and weak and did not appear that he was absorbing the information from the teaching session.    Patient went on to tell me that he did not know he had diabetes (even though he was supposedly taking Metformin at home per records) and that he was confused as to where he would be discharging to.  Patient told me he thought he was going home for one week and then on to Rehab somewhere.  I told patient I was not sure of his d/c plans and would have to have the RN address this with him.  **MD- I do no think patient will be able to successfully prepare an insulin pen and give himself an injection.  Perhaps his daughter could help him with this??  Daughter was not present at the bedside when I spoke with patient today and patient told me his daughter has a job during the day.  If patient's daughter can learn how to use insulin pen (for one shot per day), this may be a viable option for patient, however, if patient needs multiple injections a day, this plan may not work.  RNs can work with patient's daughter to teach her how to use insulin pen if she comes to hospital in the evening.   **MD- If you do send patient home on basal insulin pen, please switch to Lantus solostar  insulin pen (So Medicaid will cover) MD to give patient Rxs for insulin pens and insulin pen needles. Lantus solostar insulin pen Insulin pen needles (31 gauge, 8mm) CBG meter for home  Will follow. Ambrose Finland RN, MSN, CDE Diabetes Coordinator Inpatient Diabetes Program [phone]

## 2012-08-11 NOTE — Progress Notes (Signed)
Clinical Social Work Department CLINICAL SOCIAL WORK PSYCHIATRY SERVICE LINE ASSESSMENT 08/11/2012  Patient:  Tony Boyd  Account:  [id number]  Admit Date:  08/08/2012  Clinical Social Worker:  Unk Lightning, LCSW  Date/Time:  08/11/2012 10:30 AM Referred by:  Physician  Date referred:  08/11/2012 Reason for Referral  Psychosocial assessment   Presenting Symptoms/Problems (In the person's/family's own words):   Psych consulted to determine capacity   Abuse/Neglect/Trauma History (check all that apply)  Witness to trauma   Abuse/Neglect/Trauma Comments:   Patient was diagnosed with PTSD after he returned from serving time in the [employer].   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 Patient reports that his PCP prescribes him all of his medications at this time.   Current provider:   PCP   Place and Date:   [place], [place]   Current Medications:   sodium chloride, albuterol, LORazepam, LORazepam, oxyCODONE, sodium chloride            . ferrous sulfate  325 mg Oral QHS  . folic acid  1 mg Oral Daily  . furosemide  80 mg Oral BID WC  . gabapentin  300 mg Oral BID  . insulin aspart  0-20 Units Subcutaneous TID WC  . insulin aspart  0-5 Units Subcutaneous QHS  . insulin aspart  5 Units Subcutaneous TID WC  . insulin detemir  50 Units Subcutaneous Daily  . lactulose  30 g Oral 5 X Daily  . levETIRAcetam  500 mg Oral TID  . levothyroxine  75 mcg Oral QAC breakfast  . multivitamin with minerals  1 tablet Oral Daily  . pantoprazole  40 mg Oral Daily  . potassium chloride  10 mEq Oral BID WC  . rifaximin  550 mg Oral BID  . risperiDONE  0.25 mg Oral BID  . sodium chloride  3 mL Intravenous Q12H  . spironolactone  200 mg Oral Daily   . thiamine  100 mg Oral Daily   Or     . thiamine  100 mg Intravenous Daily   Previous Impatient Admission/Date/Reason:   Patient denies any hospitalizations.   Emotional Health / Current Symptoms    Suicide/Self Harm  None reported   Suicide attempt in the past:   Patient denies any SI or HI. Patient denies any previous suicide attempts.   Other harmful behavior:   Patient spoke about having to kill people while in the [employer] and how it affects his thinking. Patient denies any current HI but reports that the military "trained your brain" to think about killing others.   Psychotic/Dissociative Symptoms  None reported   Other Psychotic/Dissociative Symptoms:   Patient denies any psychotic features.    Attention/Behavioral Symptoms  Withdrawn   Other Attention / Behavioral Symptoms:   Patient has appropriate behavior throughout assessment.    Cognitive Impairment  Within Normal Limits   Other Cognitive Impairment:    Mood and Adjustment  Mood Congruent    Stress, Anxiety, Trauma, Any Recent Loss/Stressor  Flashbacks (Intrusive recollections of past traumatic events)   Anxiety (frequency):   Patient reports that he has some anxiety due to past experiences in the war. Patient reports that he is compliant with medications.   Phobia (specify):   N/A   Compulsive behavior (specify):  N/A   Obsessive behavior (specify):   N/A   Other:   N/A   Substance Abuse/Use  Current substance use   SBIRT completed (please refer for detailed history):  Y  Self-reported substance use:   Patient reports he drinks about once a week or every other week. Patient states he will have 1 to 2 beers with friends socially. Patient denies any substance abuse problems and reports no assistance needed for substance abuse resources.   Urinary Drug Screen Completed:  Y Alcohol level:   <11    Environmental/Housing/Living Arrangement  Stable housing   Who is in the home:    Patient lives alone.   Emergency contact:  [name]-dtr   Financial  Medicare  Medicaid   Patient's Strengths and Goals (patient's own words):   Patient reports that dtr is supportive and assists with care.   Clinical Social Worker's Interpretive Summary:   CSW received referral to complete psychosocial assessment. CSW reviewed chart and met with patient at bedside. Patient had no visitors present and agreeable to complete assessment. CSW introduced myself and explained role.    Patient reports that he was married for several years but wife passed away about 9 years ago. Patient states that wife assisted with care and that after she passed away he went into ALF at [facility] for care. Patient reports that he did not like living at ALF and moved into an apartment that his dtr set up for him. Patient reports that he is not interested in ALF again and is aware that he could go to a different facility.    CSW and patient spoke about patient's current living arrangements. Patient reports he cooks, does laundry, dresses himself and bathes on his own. Patient has a cleaning service and his dtr assists him with managing his medications. Patient refuses ALF or SNF placement and reports that dtr can provide additional assistance at DC if needed. Patient agreeable for CSW to contact dtr to provide collateral information. CSW called and left a message for dtr.    Patient spoke with CSW regarding his PTSD and his life. Patient open to discussing his time in the [employer] and told CSW stories regarding relationships and marriage. Patient reports he does get lonely at times and feels depressed due to being alone. Patient does not receive any formal treatment and politely declines any referrals.    Per chart review, psych MD reports that patient has capacity. Psych CSW is signing off at this time. Unit CSW can assist with placement if desired.   Disposition:  Psych Clinical Social Worker signing off

## 2012-08-12 ENCOUNTER — Inpatient Hospital Stay (HOSPITAL_COMMUNITY): Payer: Medicare Other

## 2012-08-12 DIAGNOSIS — E039 Hypothyroidism, unspecified: Secondary | ICD-10-CM

## 2012-08-12 LAB — BASIC METABOLIC PANEL
Calcium: 8.1 mg/dL — ABNORMAL LOW (ref 8.4–10.5)
Chloride: 89 mEq/L — ABNORMAL LOW (ref 96–112)
Creatinine, Ser: 0.76 mg/dL (ref 0.50–1.35)
GFR calc Af Amer: >90 mL/min (ref >90)
GFR calc non Af Amer: >90 mL/min (ref >90)

## 2012-08-12 LAB — GLUCOSE, CAPILLARY
Glucose-Capillary: 264 mg/dL — ABNORMAL HIGH (ref 70–99)
Glucose-Capillary: 205 mg/dL — ABNORMAL HIGH (ref 70–99)
Glucose-Capillary: 194 mg/dL — ABNORMAL HIGH (ref 70–99)

## 2012-08-12 LAB — URINALYSIS W MICROSCOPIC + REFLEX CULTURE
Glucose, UA: NEGATIVE mg/dL
Ketones, ur: NEGATIVE mg/dL
Nitrite: NEGATIVE
Protein, ur: NEGATIVE mg/dL
pH: 5.5 (ref 5.0–8.0)

## 2012-08-12 LAB — VITAMIN B12: Vitamin B-12: 1493 pg/mL — ABNORMAL HIGH (ref 211–911)

## 2012-08-12 LAB — AMMONIA: Ammonia: 67 umol/L — ABNORMAL HIGH (ref 11–60)

## 2012-08-12 MED ORDER — INSULIN GLARGINE 100 UNIT/ML ~~LOC~~ SOLN
60.0000 [IU] | Freq: Every day | SUBCUTANEOUS | Status: DC
  Administered 2012-08-13: 60 [IU] via SUBCUTANEOUS
  Filled 2012-08-12: qty 0.6

## 2012-08-12 NOTE — Progress Notes (Signed)
[facility] PROGRESS NOTE  Tony Boyd ZOX:[id number] DOB: [birth year] DOA: 08/08/2012 PCP: [name], DO  Interim summary 57 year old male with a history of hepatitis C, continued alcohol use, seizure disorder, hypothyroidism, aspiration pneumonitis, esophageal varices, presented with acute mental status change. At the time of admission in the emergency department, the patient was obtunded and unable to provide any history. Apparently the patient's daughter was going to check up on the patient and the patient was found to be confused and less responsive. The patient has been confused for a few days prior to the admission. As a result, EMS was activated and the patient was brought to the emergency department. The patient was found to have ammonia level of 240. The patient is supposed to take lactulose 5 times on daily basis to have at least 3-4 bowel movements per day. However, the patient revealed later in the hospitalization that he had been missing 2-3 doses for the past few days prior to admission to the hospital. In addition, the patient states that he had been continued to drink alcohol intermittently with his friends. The patient was restarted back on lactulose during hospitalization. His lactulose dose was adjusted to produce at least 3 bowel movements daily. His ammonia level gradually decreased. In the ED, the patient was found to be hyperglycemic with a sugar 417. His serum acetone was negative. The patient was started on IV fluids initially with improvement of his CBG. His hemoglobin A1c was noted to be 10.1 on 07/31/2012. The patient's metformin was discontinued. The patient was initially started on NovoLog sliding scale. Levemir was ultimately started during hospitalization and adjusted based upon the patient's glycemic control. The patient will be switched to Lantus pen at the time of discharge as his Medicaid will pay for this. Diabetic educator was consulted to see the patient.  In  addition, the patient was found to be hyponatremic. This was thought to be due to the patient's cirrhosis of the liver. He has a baseline hyponatremia of 125-129. Discharge was planned on 08/12/2012, but the patient became more confused prompting further workup.   Assessment/Plan: Altered Mental Status -Anticipated discharge 08/12/2012, but patient was more confused today -Normally, the patient A&Ox4 -May be related to the patient's opioid as well as Ativan use -Discontinue gabapentin -Ativan has expired--was part of CIWA protocol -Check TSH, serum B12, urinalysis with reflux the culture -Ammonia continues to trend down -Limited abdominal ultrasound to evaluate possible need for paracentesis as patient is complaining of abdominal pain to r/o SBP Hepatic encephalopathy  -Due to the patient noncompliance with lactulose  -Patient had been missing 2-3 doses daily  -Gradually improving on lactulose in-house  -Ammonia has improved to 240-->82--> 67 -Continue rifaximin  -Change lactulose to 30 g every 5X daily -pt having 3-5 BM with adjustment of lactulose dose  Diabetes mellitus type 2, uncontrolled  -Poorly controlled  -Hemoglobin A1c 10.1 on 07/31/12  -Patient is a poor candidate for insulin at this time given his noncompliance and intermitten delirium from his hepatic encephalopathy--this was discussed with the patient's daughter  -Risks, benefits, and alternatives were discussed with the patient's daughter regarding starting insulin  -she prefers oral therapy at this time with the understanding that this is suboptimal treatment giving the patient's current living situation  -Continue NovoLog sliding scale while in hospital  -Long discussion with the patient today. He feels confident that he is able to manage his own insulin at home  -Consult psychiatry for capacity evaluation regarding placement and medical management  -  Increased to Levemir 50 units daily (08/11/12) -Switch to Lantus pen  60units--starting am 08/13/12 (Medicaid will pay for his Lantus pen, but not Levemir pen)--daughter is willing and able to help the patient administer the Lantus daily at home -Home health PT, RN, home health aide were arranged for the patient prior to discharge- Hyponatremia  -Secondary to patient's liver cirrhosis  -Partly due to pseudohyponatremia from the patient's elevated glucose  -Continue diuresis with furosemide and spironolactone  -Check TSH  -Baseline sodium 124-129  -Patient drank over 3 L of fluid 08/09/12  -Check serum osmolality and urine osmolality--not suggestive of SIADH  Thrombocytopenia/Coaguloapthy  -initial INR 1.7  -Secondary to patient's cirrhosis  -No active bleeding, clinically stable at this time  End-stage liver disease  -Patient is a not a candidate for transplantation at this time due to continued alcohol usage  Seizure disorder  -Continue Keppra  -No active seizures at this time  Alcohol use  -Patient continues to drink alcohol 2 times per week  -Abstinence discussed  -Last drink was 08/07/2012  -CIWA protocol  Family Communication: Pt at beside  Disposition Plan: Back to [facility] when medically stable        Procedures/Studies: Dg Cervical Spine Complete  07/25/2012   *RADIOLOGY REPORT*  Clinical Data: Larey Seat from a motorized chair.  Left sided neck pain.  CERVICAL SPINE - COMPLETE 4+ VIEW  Comparison: Cervical spine CT 10/23/2009.  Findings: Anatomic alignment.  No visible fractures.  Disc spaces well preserved.  Calcification in the anterior annular fibers at C4- 5 and C5-6, unchanged.  Normal prevertebral soft tissues.  Facet joints intact.  No significant bony foraminal stenoses.  No static evidence of instability.  Calcification in the nuchal ligament posterior to C6.  IMPRESSION: No evidence of fracture or static signs of instability.  No significant abnormality.   Original Report Authenticated By: Hulan Saas, M.D.   Dg  Clavicle Left  07/25/2012   *RADIOLOGY REPORT*  Clinical Data: Larey Seat from a motorized chair, landing on the left shoulder.  LEFT CLAVICLE - 2+ VIEWS  Comparison: No prior clavicle imaging.  Visualized left clavicle and chest x-ray 04/16/2012.  Findings: No fractures identified involving the clavicle. Acromioclavicular joint intact with mild to moderate degenerative changes.  IMPRESSION: No acute osseous abnormality.  Degenerative changes in the [facility] joint.   Original Report Authenticated By: Hulan Saas, M.D.   Ct Angio Neck W/cm &/or Wo/cm  07/25/2012   *RADIOLOGY REPORT*  Clinical Data:  Fall.  Hematoma in the left neck.  CT NECK WITHOUT CONTRAST  Technique:  Multidetector CT imaging of the neck was performed following the standard protocol without intravenous contrast.  Comparison:  CT head without contrast 06/01/2012  Findings:  There is a common origin of the innominate artery and the left common carotid artery.  Both vertebral arteries originate from the subclavian arteries.  The right vertebral artery is the dominant vessel.  There are no significant stenoses in the neck. The PICA origins are visualized and normal.  The basilar artery is within normal limits.  Both posterior cerebral arteries originate from the basilar tip.  The right common carotid artery is within normal limits.  The right carotid bifurcation is normal.  There is moderate tortuosity of the high cervical right ICA without a significant stenosis.  Minimal atherosclerotic calcifications are present within the cavernous segment of the right internal carotid artery without a significant stenosis.  The left common carotid artery is mildly tortuous.  The left carotid bifurcation  is within normal limits.  Moderate tortuosity is noted in the high cervical left ICA.  The intracranial left ICA demonstrates mild atherosclerotic calcifications at the cavernous segment without a significant stenosis.  The A1 and M1 segments are normal.  The anterior  communicating artery is patent.  The carotid bifurcations are unremarkable.  The visualized branch vessels demonstrate mild segmental irregularity without a significant proximal stenosis, aneurysm, or occlusion.  IMPRESSION:  1.  Moderate tortuosity of the cervical internal carotid arteries bilaterally. 2.  No significant carotid artery disease. 3.  Tortuosity of the proximal vertebral arteries bilaterally without a significant stenosis.  The right vertebral artery is the dominant vessel. 4.  Mild distal intracranial small vessel disease.   Original Report Authenticated By: Marin Roberts, M.D.         Subjective: Patient is pleasantly confused. He denies any fevers, chills, chest discomfort, shortness of breath, nausea, vomiting. He has had numerous loose stools. He complains of some abdominal cramping/pain today.  Objective: Filed Vitals:   08/11/12 0609 08/11/12 1406 08/11/12 2152 08/12/12 0559  BP: 128/61 134/63 124/58 125/64  Pulse: 98 100 99 93  Temp: 98 F (36.7 C) 98.1 F (36.7 C) 98.1 F (36.7 C) 98 F (36.7 C)  TempSrc: Oral Oral Oral Oral  Resp: 19 18 16 16   Height:      Weight: 123.1 kg (271 lb 6.2 oz)     SpO2: 98% 100% 98% 100%    Intake/Output Summary (Last 24 hours) at 08/12/12 1040 Last data filed at 08/12/12 0605  Gross per 24 hour  Intake    770 ml  Output    475 ml  Net    295 ml   Weight change:  Exam:   General:  Pt is alert, follows commands appropriately, not in acute distress  HEENT: No icterus, No thrush,[place]/AT  Cardiovascular: RRR, S1/S2, no rubs, no gallops  Respiratory: CTA bilaterally, no wheezing, no crackles, no rhonchi  Abdomen: Soft/+BS, tender periumbilical,mildly distended, no guarding  Extremities: 2+ LE edema, No lymphangitis, No petechiae, No rashes, no synovitis  Data Reviewed: Basic Metabolic Panel:  Recent Labs Lab 08/08/12 1250 08/09/12 0520 08/10/12 0543 08/11/12 0356 08/12/12 0714  NA 125* 123* 119* 119*  120*  K 4.5 3.7 4.4 5.1 5.4*  CL 89* 88* 85* 87* 89*  CO2 30 28 26 26 26   GLUCOSE 430* 314* 366* 377* 224*  BUN 14 12 12 12 14   CREATININE 0.81 0.71 0.81 0.79 0.76  CALCIUM 8.7 8.5 8.5 8.5 8.1*   Liver Function Tests:  Recent Labs Lab 08/09/12 0520 08/10/12 0543  AST 64* 80*  ALT 41 50  ALKPHOS 232* 260*  BILITOT 4.9* 4.4*  PROT 6.7 7.0  ALBUMIN 2.0* 2.1*   No results found for this basename: LIPASE, AMYLASE,  in the last 168 hours  Recent Labs Lab 08/08/12 1330 08/09/12 0520 08/10/12 0543 08/12/12 0714  AMMONIA 240* 102* 82* 67*   CBC:  Recent Labs Lab 08/08/12 1250 08/09/12 0520  WBC 4.4 4.1  NEUTROABS 3.4  --   HGB 11.9* 11.2*  HCT 32.5* 31.9*  MCV 100.6* 102.2*  PLT 39* 32*   Cardiac Enzymes: No results found for this basename: CKTOTAL, CKMB, CKMBINDEX, TROPONINI,  in the last 168 hours BNP: No components found with this basename: POCBNP,  CBG:  Recent Labs Lab 08/11/12 0702 08/11/12 1158 08/11/12 1735 08/11/12 2155 08/12/12 0729  GLUCAP 341* 367* 300* 274* 240*    No results found for this  or any previous visit (from the past 240 hour(s)).   Scheduled Meds: . ferrous sulfate  325 mg Oral QHS  . folic acid  1 mg Oral Daily  . furosemide  80 mg Oral BID WC  . insulin aspart  0-20 Units Subcutaneous TID WC  . insulin aspart  0-5 Units Subcutaneous QHS  . insulin aspart  5 Units Subcutaneous TID WC  . insulin detemir  50 Units Subcutaneous Daily  . lactulose  30 g Oral 5 X Daily  . levETIRAcetam  500 mg Oral TID  . levothyroxine  75 mcg Oral QAC breakfast  . multivitamin with minerals  1 tablet Oral Daily  . pantoprazole  40 mg Oral Daily  . rifaximin  550 mg Oral BID  . risperiDONE  0.25 mg Oral BID  . sodium chloride  3 mL Intravenous Q12H  . spironolactone  200 mg Oral Daily  . thiamine  100 mg Oral Daily   Or  . thiamine  100 mg Intravenous Daily   Continuous Infusions:    Camaria Gerald, DO  [facility] Pager  [phone]  If 7PM-7AM, please contact night-coverage [email or web address] Password [email or web address] 08/12/2012, 10:40 AM   LOS: 4 days

## 2012-08-12 NOTE — Progress Notes (Signed)
Spoke with pt concerning Home Health at discharge. Pt states that he plan to dc back to [facility], to take care of some business then transfer to SNF. Pt asked that I call his Daughter Marjo Bicker who is the HCPOA, pt gave me her telephone number.  Daughter was called, no answer left VM [phone].

## 2012-08-12 NOTE — ED Provider Notes (Signed)
History     CSN: [id number]  Arrival date & time 08/08/12  1119   First MD Initiated Contact with Patient 08/08/12 1133      Chief Complaint  Patient presents with  . Weakness  . Hyperglycemia    (Consider location/radiation/quality/duration/timing/severity/associated sxs/prior treatment) HPI Patient presents emergency department with altered mental status and confusion.  It began earlier today.  Patient, states, that he's feeling better, but still feels, weakness.  Patient, states he isn't having chest pain, shortness of breath, nausea, vomiting, diarrhea, blurred vision, headache, dizziness, neck pain, back pain, cough, or fever.  Patient, states, that he's had previous episodes of this with his cirrhosis.  Patient, states symptoms have been constant since this morning Past Medical History  Diagnosis Date  . Cirrhosis of liver   . Hepatitis C   . ETOH abuse   . Hypothyroid   . Psychosis   . Bipolar disorder   . Seizures   . Arthritis   . Back pain   . Coagulopathy     Hx of  . Thrombocytopenia     Hx of  . Pancytopenia     Hx of  . H/O hypokalemia   . Hyponatremia     Hx of  . Korsakoff psychosis   . H/O abdominal abscess   . H/O esophageal varices   . Metabolic encephalopathy   . Hepatic encephalopathy   . Urinary urgency   . H/O renal failure   . H/O ascites   . Altered mental status   . Anemia   . Ascites   . Shortness of breath   . Peripheral vascular disease   . GERD (gastroesophageal reflux disease)   . Thrombocytopenia, acquired 06/02/2012  . Unspecified hypothyroidism 06/02/2012  . Pain in joint, pelvic region and thigh   . Edema   . Obesity, unspecified   . Chest pain, unspecified   . Pneumonitis due to inhalation of food or vomitus   . Other convulsions   . Cellulitis and abscess of upper arm and forearm   . Chronic hepatitis C without mention of hepatic coma   . Hypopotassemia   . Anemia, unspecified   . Other and unspecified coagulation  defects   . Thrombocytopenia, unspecified   . Bipolar I disorder, most recent episode (or current) unspecified   . Unspecified psychosis   . Encephalopathy   . Esophageal varices without mention of bleeding   . Esophagitis, unspecified   . Abscess of liver(572.0)   . Chronic kidney disease, unspecified   . Lumbago   . Personal history of alcoholism   . Personal history of tobacco use, presenting hazards to health   . Cirrhosis of liver without mention of alcohol   . Pneumonitis due to inhalation of food or vomitus 09/19/2010  . Chronic hepatitis C without mention of hepatic coma   . Bipolar I disorder, most recent episode (or current) unspecified   . Unspecified psychosis   . Other ascites   . Personal history of tobacco use, presenting hazards to health   . Cirrhosis of liver without mention of alcohol   . Type 2 diabetes mellitus with diabetic neuropathy     Past Surgical History  Procedure Laterality Date  . Colostomy    . Cholecystectomy    . Small intestine surgery    . Arm surgery      left arm  . US guided drain placement in ventral hernia abscess  07/21/2010  . Multiple picc line placements    .  Esophagogastroduodenoscopy  06/28/2011    Procedure: ESOPHAGOGASTRODUODENOSCOPY (EGD);  Surgeon: Louis Meckel, MD;  Location: [facility];  Service: Endoscopy;  Laterality: N/A;  . Esophagogastroduodenoscopy N/A 05/06/2012    Procedure: ESOPHAGOGASTRODUODENOSCOPY (EGD);  Surgeon: Louis Meckel, MD;  Location: [facility];  Service: Endoscopy;  Laterality: N/A;  . Esophageal banding N/A 05/06/2012    Procedure: ESOPHAGEAL BANDING;  Surgeon: Louis Meckel, MD;  Location: [facility];  Service: Endoscopy;  Laterality: N/A;    Family History  Problem Relation Age of Onset  . Heart disease Mother     MI  . Lung cancer Brother     History  Substance Use Topics  . Smoking status: Former Smoker    Types: Cigarettes    Quit date: 02/15/2008  . Smokeless tobacco: Never  Used  . Alcohol Use: No      Review of Systems All other systems negative except as documented in the HPI. All pertinent positives and negatives as reviewed in the HPI.  Allergies  Ativan; Droperidol; Ketorolac; Penicillins; and Toradol  Home Medications   Current Outpatient Rx  Name  Route  Sig  Dispense  Refill  . glucose blood (ACCU-CHEK ACTIVE STRIPS) test strip      Use as instructed   100 each   12   . glucose monitoring kit (FREESTYLE) monitoring kit   Does not apply   1 each by Does not apply route as needed for other. Check sugar 4 times daily, once before each meal and at bedtime   1 each   0   . Lancets (ONETOUCH ULTRASOFT) lancets      Use as instructed   100 each   12     BP 122/55  Pulse 100  Temp(Src) 98.4 F (36.9 C) (Oral)  Resp 16  Ht 5\' 8"  (1.727 m)  Wt 271 lb 6.2 oz (123.1 kg)  BMI 41.27 kg/m2  SpO2 97%  Physical Exam  Nursing note and vitals reviewed. Constitutional: He appears well-developed and well-nourished. No distress.  HENT:  Head: Normocephalic and atraumatic.  Mouth/Throat: Oropharynx is clear and moist.  Eyes: Pupils are equal, round, and reactive to light.  Neck: Normal range of motion. Neck supple.  Cardiovascular: Normal rate, regular rhythm and normal heart sounds.  Exam reveals no gallop and no friction rub.   No murmur heard. Pulmonary/Chest: Effort normal and breath sounds normal. No respiratory distress.  Abdominal: Soft. Bowel sounds are normal. He exhibits distension and ascites. There is no tenderness. There is no CVA tenderness. No hernia.  Neurological: He is alert. He exhibits normal muscle tone. Coordination normal.  Patient initially somewhat confused, didn't know he was at the hospital, but could not tell me the month.  Skin: Skin is warm and dry.    ED Course  Procedures (including critical care time)  Labs Reviewed  GLUCOSE, CAPILLARY - Abnormal; Notable for the following:    Glucose-Capillary 417  (*)    All other components within normal limits  CBC WITH DIFFERENTIAL - Abnormal; Notable for the following:    RBC 3.23 (*)    Hemoglobin 11.9 (*)    HCT 32.5 (*)    MCV 100.6 (*)    MCH 36.8 (*)    MCHC 36.6 (*)    Platelets 39 (*)    Lymphocytes Relative 9 (*)    Monocytes Relative 14 (*)    Lymphs Abs 0.4 (*)    All other components within normal limits  BASIC METABOLIC PANEL -  Abnormal; Notable for the following:    Sodium 125 (*)    Chloride 89 (*)    Glucose, Bld 430 (*)    All other components within normal limits  URINALYSIS, ROUTINE W REFLEX MICROSCOPIC - Abnormal; Notable for the following:    Color, Urine AMBER (*)    Glucose, UA >1000 (*)    Urobilinogen, UA 2.0 (*)    All other components within normal limits  AMMONIA - Abnormal; Notable for the following:    Ammonia 240 (*)    All other components within normal limits  BLOOD GAS, VENOUS - Abnormal; Notable for the following:    pH, Ven 7.418 (*)    pO2, Ven 48.1 (*)    Bicarbonate 28.5 (*)    Acid-Base Excess 3.9 (*)    All other components within normal limits  PROTIME-INR - Abnormal; Notable for the following:    Prothrombin Time 19.5 (*)    INR 1.71 (*)    All other components within normal limits  CBC - Abnormal; Notable for the following:    RBC 3.12 (*)    Hemoglobin 11.2 (*)    HCT 31.9 (*)    MCV 102.2 (*)    MCH 35.9 (*)    RDW 15.9 (*)    Platelets 32 (*)    All other components within normal limits  COMPREHENSIVE METABOLIC PANEL - Abnormal; Notable for the following:    Sodium 123 (*)    Chloride 88 (*)    Glucose, Bld 314 (*)    Albumin 2.0 (*)    AST 64 (*)    Alkaline Phosphatase 232 (*)    Total Bilirubin 4.9 (*)    All other components within normal limits  AMMONIA - Abnormal; Notable for the following:    Ammonia 102 (*)    All other components within normal limits  GLUCOSE, CAPILLARY - Abnormal; Notable for the following:    Glucose-Capillary 338 (*)    All other  components within normal limits  GLUCOSE, CAPILLARY - Abnormal; Notable for the following:    Glucose-Capillary 292 (*)    All other components within normal limits  GLUCOSE, CAPILLARY - Abnormal; Notable for the following:    Glucose-Capillary 299 (*)    All other components within normal limits  GLUCOSE, CAPILLARY - Abnormal; Notable for the following:    Glucose-Capillary 340 (*)    All other components within normal limits  COMPREHENSIVE METABOLIC PANEL - Abnormal; Notable for the following:    Sodium 119 (*)    Chloride 85 (*)    Glucose, Bld 366 (*)    Albumin 2.1 (*)    AST 80 (*)    Alkaline Phosphatase 260 (*)    Total Bilirubin 4.4 (*)    All other components within normal limits  AMMONIA - Abnormal; Notable for the following:    Ammonia 82 (*)    All other components within normal limits  GLUCOSE, CAPILLARY - Abnormal; Notable for the following:    Glucose-Capillary 328 (*)    All other components within normal limits  GLUCOSE, CAPILLARY - Abnormal; Notable for the following:    Glucose-Capillary 347 (*)    All other components within normal limits  GLUCOSE, CAPILLARY - Abnormal; Notable for the following:    Glucose-Capillary 345 (*)    All other components within normal limits  OSMOLALITY, URINE - Abnormal; Notable for the following:    Osmolality, Ur 276 (*)    All other components within normal  limits  GLUCOSE, CAPILLARY - Abnormal; Notable for the following:    Glucose-Capillary 376 (*)    All other components within normal limits  BASIC METABOLIC PANEL - Abnormal; Notable for the following:    Sodium 119 (*)    Chloride 87 (*)    Glucose, Bld 377 (*)    All other components within normal limits  GLUCOSE, CAPILLARY - Abnormal; Notable for the following:    Glucose-Capillary 366 (*)    All other components within normal limits  GLUCOSE, CAPILLARY - Abnormal; Notable for the following:    Glucose-Capillary 336 (*)    All other components within normal  limits  GLUCOSE, CAPILLARY - Abnormal; Notable for the following:    Glucose-Capillary 341 (*)    All other components within normal limits  GLUCOSE, CAPILLARY - Abnormal; Notable for the following:    Glucose-Capillary 367 (*)    All other components within normal limits  BASIC METABOLIC PANEL - Abnormal; Notable for the following:    Sodium 120 (*)    Potassium 5.4 (*)    Chloride 89 (*)    Glucose, Bld 224 (*)    Calcium 8.1 (*)    All other components within normal limits  AMMONIA - Abnormal; Notable for the following:    Ammonia 67 (*)    All other components within normal limits  GLUCOSE, CAPILLARY - Abnormal; Notable for the following:    Glucose-Capillary 300 (*)    All other components within normal limits  GLUCOSE, CAPILLARY - Abnormal; Notable for the following:    Glucose-Capillary 274 (*)    All other components within normal limits  GLUCOSE, CAPILLARY - Abnormal; Notable for the following:    Glucose-Capillary 240 (*)    All other components within normal limits  VITAMIN B12 - Abnormal; Notable for the following:    Vitamin B-12 1493 (*)    All other components within normal limits  URINALYSIS W MICROSCOPIC + REFLEX CULTURE - Abnormal; Notable for the following:    Hgb urine dipstick TRACE (*)    Leukocytes, UA SMALL (*)    Bacteria, UA FEW (*)    Squamous Epithelial / LPF FEW (*)    All other components within normal limits  GLUCOSE, CAPILLARY - Abnormal; Notable for the following:    Glucose-Capillary 264 (*)    All other components within normal limits  GLUCOSE, CAPILLARY - Abnormal; Notable for the following:    Glucose-Capillary 205 (*)    All other components within normal limits  ETHANOL  APTT  KETONES, QUALITATIVE  URINE MICROSCOPIC-ADD ON  OSMOLALITY  TSH  FOLATE RBC  CBC  COMPREHENSIVE METABOLIC PANEL   US Abdomen Limited  08/12/2012   *RADIOLOGY REPORT*  Clinical Data: Assess volume of ascites  LIMITED ABDOMEN ULTRASOUND FOR ASCITES   Technique:  Limited ultrasound survey for ascites was performed in all four abdominal quadrants.  Comparison:  None.  Findings: Four-quadrant survey views of the abdomen reveal no ascites.  IMPRESSION: No ascites identified   Original Report Authenticated By: Esperanza Heir, M.D.     1. Cirrhosis   2. Hepatic encephalopathy   3. Hyperglycemia   4. Ascites   5. Coagulopathy   6. Diabetes mellitus   7. Episodic confusion   8. Hyponatremia   9. Seizure disorder   10. Thrombocytopenia, acquired   11. Unspecified hypothyroidism   12. ETOH abuse   13. Type II or unspecified type diabetes mellitus with unspecified complication, uncontrolled    Patient be  admitted to the hospital for hepatic encephalopathy and cirrhosis.  Patient is advised of the need for admission.  All questions were answered.  Spoke with the, [facility] to admit the patient for further evaluation and care   MDM  MDM Reviewed: vitals, nursing note and previous chart Reviewed previous: labs Interpretation: labs, x-ray and ECG Consults: admitting MD   Date: 08/12/2012  Rate: 91  Rhythm: normal sinus rhythm  QRS Axis: left  Intervals: normal  ST/T Wave abnormalities: nonspecific T wave changes  Conduction Disutrbances:none  Narrative Interpretation:   Old EKG Reviewed: unchanged            Carlyle Dolly, PA-C 08/12/12 1842

## 2012-08-12 NOTE — Progress Notes (Signed)
Physical Therapy Treatment Patient Details Name: Jamare Vanatta MRN: [id number] DOB: 1955-08-22 Today's Date: 08/12/2012 Time: 8413-2440 PT Time Calculation (min): 13 min  PT Assessment / Plan / Recommendation Comments on Treatment Session  Assisted pt OOB to amb in hallway w/o AD due to pt's impulsive/quick nature.  Good alternating gait with no LOB even with his L UE in sling. Pt plans to return to his [facility] apt.    Follow Up Recommendations  Home health PT     Does the patient have the potential to tolerate intense rehabilitation     Barriers to Discharge        Equipment Recommendations  None recommended by PT    Recommendations for Other Services    Frequency Min 3X/week   Plan      Precautions / Restrictions   L UE in sling  Pertinent Vitals/Pain No c/o pain    Mobility  Bed Mobility Bed Mobility: Supine to Sit;Sit to Supine Supine to Sit: 6: Modified independent (Device/Increase time) Sit to Supine: 6: Modified independent (Device/Increase time) Details for Bed Mobility Assistance: increased time Transfers Transfers: Sit to Stand;Stand to Sit Sit to Stand: 5: Supervision;From bed Stand to Sit: 5: Supervision;To bed Details for Transfer Assistance: VCS safety, technique, hand placement.  Ambulation/Gait Ambulation/Gait Assistance: 4: Min guard;4: Min Environmental consultant (Feet): 450 Feet Assistive device: None;1 person hand held assist Ambulation/Gait Assistance Details: Amb w/o AD due to pt's impulsive/easily distracted nature.  L UE in sling but did not seem to unsteady his gait. No LOB.  Noted only slight SOB after activity.  Tolerated well. Gait Pattern: Step-through pattern;Decreased stride length Gait velocity: quick     PT Goals                                                                 progressing    Visit Information  Last PT Received On: 08/12/12 Assistance Needed: +1    Subjective Data      Cognition       Balance      End of Session PT - End of Session Equipment Utilized During Treatment: Gait belt Activity Tolerance: Patient tolerated treatment well Patient left: in bed;with call bell/phone within reach;with bed alarm set   Felecia Shelling  PTA [facility]  Acute  Rehab Pager      [phone]

## 2012-08-13 LAB — COMPREHENSIVE METABOLIC PANEL
ALT: 45 U/L (ref 0–53)
Alkaline Phosphatase: 250 U/L — ABNORMAL HIGH (ref 39–117)
BUN: 15 mg/dL (ref 6–23)
CO2: 28 mEq/L (ref 19–32)
GFR calc Af Amer: >90 mL/min (ref >90)
GFR calc non Af Amer: >90 mL/min (ref >90)
Glucose, Bld: 172 mg/dL — ABNORMAL HIGH (ref 70–99)
Potassium: 4.1 mEq/L (ref 3.5–5.1)
Sodium: 127 mEq/L — ABNORMAL LOW (ref 135–145)
Total Bilirubin: 3 mg/dL — ABNORMAL HIGH (ref 0.3–1.2)

## 2012-08-13 LAB — GLUCOSE, CAPILLARY
Glucose-Capillary: 197 mg/dL — ABNORMAL HIGH (ref 70–99)
Glucose-Capillary: 249 mg/dL — ABNORMAL HIGH (ref 70–99)

## 2012-08-13 LAB — CBC
HCT: 32.1 % — ABNORMAL LOW (ref 39.0–52.0)
Hemoglobin: 11.4 g/dL — ABNORMAL LOW (ref 13.0–17.0)
RBC: 3.1 MIL/uL — ABNORMAL LOW (ref 4.22–5.81)

## 2012-08-13 MED ORDER — INSULIN GLARGINE 100 UNITS/ML SOLOSTAR PEN: 60.0000 [IU] | PEN_INJECTOR | Freq: Every day | SUBCUTANEOUS | Status: DC

## 2012-08-13 MED ORDER — OXYCODONE HCL 5 MG PO TABS: 5.0000 mg | ORAL_TABLET | Freq: Four times a day (QID) | ORAL | Status: DC | PRN

## 2012-08-13 MED ORDER — INSULIN ASPART 100 UNIT/ML ~~LOC~~ SOLN: 5.0000 [IU] | Freq: Three times a day (TID) | SUBCUTANEOUS | Status: DC

## 2012-08-13 MED ORDER — FREESTYLE SYSTEM KIT: 1.0000 | PACK | Status: DC | PRN

## 2012-08-13 MED ORDER — ACCU-CHEK MULTICLIX LANCETS MISC: Status: DC

## 2012-08-13 NOTE — Progress Notes (Signed)
Spoke with pt & daughter about glucose monitoring and Insulin administration. Explained to daughter about Novolog 3x a day and Lantus at night. Return demonstration on Insulin done by daughter. Questions answered.

## 2012-08-13 NOTE — Progress Notes (Signed)
Spoke with daughter Armstead Peaks who states that she would like [facility] for Home Health. Armstead Peaks also stated that she will give pt's his medications and insulin. Referral given to [facility], RN with [facility].

## 2012-08-13 NOTE — Discharge Summary (Addendum)
Physician Discharge Summary  Tony Boyd ZOX:[id number] DOB: 10/19/1955 DOA: 08/08/2012  PCP: [name], DO  Admit date: 08/08/2012 Discharge date: 08/13/2012  Time spent:40 minutes  Recommendations for Outpatient Follow-up:  1. D/C to independent living. Patient and her daughter will decide further up on skilled nursing facility. 2. Follow up wth PCP in 1 week. Needs cbc and BMET checked  Discharge Diagnoses:   Principal Problem:   Hepatic encephalopathy  Active Problems:   Type II or unspecified type diabetes mellitus with unspecified complication, uncontrolled   Hepatitis C   Hyponatremia   Cirrhosis   Seizure disorder   Unspecified hypothyroidism   ETOH abuse   Thrombocytopenia, acquired   Discharge Condition: fair  Diet recommendation: Diabetic with fluid restrictions to 1500 cc per day  [facility] Weights   08/08/12 1557 08/11/12 0609  Weight: 127.007 kg (280 lb) 123.1 kg (271 lb 6.2 oz)    History of present illness:  Please refer to admission H&P for details, but in brief, 57 year old male with a history of hepatitis C, continued alcohol use, seizure disorder, hypothyroidism, aspiration pneumonitis, esophageal varices, presented with acute mental status change. At the time of admission in the emergency department, the patient was obtunded and unable to provide any history. Apparently the patient's daughter was going to check up on the patient and the patient was found to be confused and less responsive. The patient has been confused for a few days prior to the admission. As a result, EMS was activated and the patient was brought to the emergency department. The patient was found to have ammonia level of 240. The patient is supposed to take lactulose 5 times on daily basis to have at least 3-4 bowel movements per day. However, the patient revealed later in the hospitalization that he had been missing 2-3 doses for the past few days prior to admission to the hospital. In  addition, the patient states that he had been continued to drink alcohol intermittently with his friends. The patient was restarted back on lactulose during hospitalization. His lactulose dose was adjusted to produce at least 3 bowel movements daily. His ammonia level gradually decreased. In the ED, the patient was found to be hyperglycemic with a sugar 417. His serum acetone was negative. The patient was started on IV fluids initially with improvement of his CBG. His hemoglobin A1c was noted to be 10.1 on 07/31/2012. The patient's metformin was discontinued. The patient was initially started on NovoLog sliding scale. He was then started on glargine insulin. The patient will be switched to Lantus pen at the time of discharge as his Medicaid will pay for this. Diabetic educator was consulted to see the patient. In addition, the patient was found to be hyponatremic. This was thought to be due to the patient's cirrhosis of the liver. He has a baseline hyponatremia of 125-129. Discharge was planned on 08/12/2012, but the patient became  confused and monitored overnight.     Hospital Course:    Hepatic encephalopathy  -Due to the patient noncompliance with lactulose  -Patient had been missing 2-3 doses daily  -Gradually improving on lactulose in-house  -Ammonia level  improved to 240-->82--> 67  -Continue rifaximin  -Continue  Lactulose at  30 g every 5X daily  -pt having 3-5 BM with adjustment of lactulose dose  -Patient had unexplained altered mental status on 6/17 and discharge was held. His Neurontin has been discontinued and he is off Ativan as well. Patient is alert and oriented however gets slightly  confused getting discussion. Does not have any flapping tremors. -he will follow up with PCP in 1 week  Diabetes mellitus type 2, uncontrolled  -Poorly controlled  -Hemoglobin A1c 10.1 on 07/31/12  -Initially thought that patient was a poor candidate for insulin therapy given his encephalopathy  however after long discussion with patient and his daughter it appeared that he was able to be discharged on insulin. His daughter he is able to provide him with insulin injections.  -Patient will be discharged on Lantus pen 60units (Medicaid will pay for his Lantus pen, but not Levemir pen). He'll also be discharged on 5 units of aspart insulin leniency candidate. --daughter is willing and able to help the patient administer the Lantus daily at home  -Home health PT, RN, home health aide were arranged for the patient.  Hyponatremia  -Secondary to patient's liver cirrhosis  -Partly due to pseudohyponatremia from the patient's elevated glucose  -Continue diuresis with furosemide and spironolactone  -TSH normal -Baseline sodium 124-129   Thrombocytopenia/Coaguloapthy  -initial INR 1.7  -Secondary to patient's cirrhosis  -No active bleeding, clinically stable.  End-stage liver disease  -Patient is a not a candidate for transplantation at this time due to continued alcohol usage.  Seizure disorder  -Continue Keppra   Alcohol use  -Patient continues to drink alcohol 2 times per week  -Abstinence discussed  -Last drink was 08/07/2012  -He was maintained on CIWA protocol . No signs of withdrawal  Code status: Full code  Disposition Plan: Back to [facility] . Previous hospitalist and SW discussed with his daughter on SNF placement and patient and his daughter will decide on it after discharge to independent living   Procedures:   none  Consultations:  none  Discharge Exam: Filed Vitals:   08/12/12 0559 08/12/12 1511 08/12/12 2203 08/12/12 2239  BP: 125/64 122/55 124/53   Pulse: 93 100 100   Temp: 98 F (36.7 C) 98.4 F (36.9 C) 98.4 F (36.9 C)   TempSrc: Oral Oral Oral   Resp: 16 16 16    Height:      Weight:      SpO2: 100% 97% 96% 98%    General: Middle aged male lying in bid in no acute distress HEENT: No pallor, icterus, moist oral  mucosa Chest: Clear to auscultation bilaterally, no added sounds CVS: Normal S1 and S2, no murmurs rub or gallop Abdomen: Soft, distended , nontender, bowel sounds present Extremities: Warm, trace edema bilaterally CNS: AAO x3 but has occasional confusion during conversation, no flapping tremors  Discharge Instructions   Future Appointments Provider Department Dept Phone   09/08/2012 8:30 AM [facility] [phone]   09/11/2012 10:00 AM Tiffany Alroy Dust, DO [facility] [phone]       Medication List    STOP taking these medications       ALPRAZolam 1 MG tablet  Commonly known as:  XANAX     metFORMIN 1000 MG tablet  Commonly known as:  GLUCOPHAGE     potassium chloride 10 MEQ tablet  Commonly known as:  K-DUR      TAKE these medications       albuterol 108 (90 BASE) MCG/ACT inhaler  Commonly known as:  PROVENTIL HFA;VENTOLIN HFA  Inhale 2 puffs into the lungs 2 (two) times daily as needed for wheezing.     ferrous sulfate 325 (65 FE) MG tablet  Take 325 mg by mouth at bedtime.     folic acid 1  MG tablet  Commonly known as:  FOLVITE  Take 1 mg by mouth daily.     furosemide 80 MG tablet  Commonly known as:  LASIX  Take 80 mg by mouth 2 (two) times daily.     glucose blood test strip  Commonly known as:  ACCU-CHEK ACTIVE STRIPS  Use as instructed     glucose monitoring kit monitoring kit  1 each by Does not apply route as needed for other. Check sugar 4 times daily, once before each meal and at bedtime     glucose monitoring kit monitoring kit  1 each by Does not apply route as needed for other.     insulin aspart 100 UNIT/ML injection  Commonly known as:  novoLOG  Inject 5 Units into the skin 3 (three) times daily with meals. ( prescribed insulin pen)     insulin glargine 100 units/mL Soln  Commonly known as:  LANTUS  Inject 60 Units into the skin daily at 10 pm. ( prescribed insulin pen)     lactulose 10 GM/15ML solution   Commonly known as:  CHRONULAC  Take 30 g by mouth 5 (five) times daily.     levETIRAcetam 500 MG tablet  Commonly known as:  KEPPRA  Take 500 mg by mouth 3 (three) times daily.     levothyroxine 75 MCG tablet  Commonly known as:  SYNTHROID, LEVOTHROID  Take 75 mcg by mouth daily.     multivitamin with minerals Tabs  Take 1 tablet by mouth daily.     omeprazole 20 MG capsule  Commonly known as:  PRILOSEC  Take 20 mg by mouth daily.     onetouch ultrasoft lancets  Use as instructed     accu-chek multiclix lancets  Use as instructed     oxyCODONE 5 MG immediate release tablet  Commonly known as:  Oxy IR/ROXICODONE  Take 1 tablet (5 mg total) by mouth every 6 (six) hours as needed for pain.     rifaximin 550 MG Tabs  Commonly known as:  XIFAXAN  Take 550 mg by mouth 2 (two) times daily.     risperiDONE 0.25 MG tablet  Commonly known as:  RISPERDAL  Take 0.25 mg by mouth 2 (two) times daily.     spironolactone 100 MG tablet  Commonly known as:  ALDACTONE  Take 200 mg by mouth daily.       Allergies  Allergen Reactions  . Ativan (Lorazepam) Other (See Comments)    "hallucinations"  . Droperidol Other (See Comments)    unknown  . Ketorolac Other (See Comments)    unknown  . Penicillins Swelling and Other (See Comments)    unkown  . Toradol (Ketorolac Tromethamine) Hives       Follow-up Information   Follow up with REED, TIFFANY, DO. Schedule an appointment as soon as possible for a visit in 1 week. (check cbc and BMET)    Contact information:   [address] [place] [address] [phone]        The results of significant diagnostics from this hospitalization (including imaging, microbiology, ancillary and laboratory) are listed below for reference.    Significant Diagnostic Studies: Dg Cervical Spine Complete  07/25/2012   *RADIOLOGY REPORT*  Clinical Data: Larey Seat from a motorized chair.  Left sided neck pain.  CERVICAL SPINE - COMPLETE 4+ VIEW   Comparison: Cervical spine CT 10/23/2009.  Findings: Anatomic alignment.  No visible fractures.  Disc spaces well preserved.  Calcification in the  anterior annular fibers at C4- 5 and C5-6, unchanged.  Normal prevertebral soft tissues.  Facet joints intact.  No significant bony foraminal stenoses.  No static evidence of instability.  Calcification in the nuchal ligament posterior to C6.  IMPRESSION: No evidence of fracture or static signs of instability.  No significant abnormality.   Original Report Authenticated By: Hulan Saas, M.D.   Dg Clavicle Left  07/25/2012   *RADIOLOGY REPORT*  Clinical Data: Larey Seat from a motorized chair, landing on the left shoulder.  LEFT CLAVICLE - 2+ VIEWS  Comparison: No prior clavicle imaging.  Visualized left clavicle and chest x-ray 04/16/2012.  Findings: No fractures identified involving the clavicle. Acromioclavicular joint intact with mild to moderate degenerative changes.  IMPRESSION: No acute osseous abnormality.  Degenerative changes in the [facility] joint.   Original Report Authenticated By: Hulan Saas, M.D.   Ct Angio Neck W/cm &/or Wo/cm  07/25/2012   *RADIOLOGY REPORT*  Clinical Data:  Fall.  Hematoma in the left neck.  CT NECK WITHOUT CONTRAST  Technique:  Multidetector CT imaging of the neck was performed following the standard protocol without intravenous contrast.  Comparison:  CT head without contrast 06/01/2012  Findings:  There is a common origin of the innominate artery and the left common carotid artery.  Both vertebral arteries originate from the subclavian arteries.  The right vertebral artery is the dominant vessel.  There are no significant stenoses in the neck. The PICA origins are visualized and normal.  The basilar artery is within normal limits.  Both posterior cerebral arteries originate from the basilar tip.  The right common carotid artery is within normal limits.  The right carotid bifurcation is normal.  There is moderate tortuosity of the  high cervical right ICA without a significant stenosis.  Minimal atherosclerotic calcifications are present within the cavernous segment of the right internal carotid artery without a significant stenosis.  The left common carotid artery is mildly tortuous.  The left carotid bifurcation is within normal limits.  Moderate tortuosity is noted in the high cervical left ICA.  The intracranial left ICA demonstrates mild atherosclerotic calcifications at the cavernous segment without a significant stenosis.  The A1 and M1 segments are normal.  The anterior communicating artery is patent.  The carotid bifurcations are unremarkable.  The visualized branch vessels demonstrate mild segmental irregularity without a significant proximal stenosis, aneurysm, or occlusion.  IMPRESSION:  1.  Moderate tortuosity of the cervical internal carotid arteries bilaterally. 2.  No significant carotid artery disease. 3.  Tortuosity of the proximal vertebral arteries bilaterally without a significant stenosis.  The right vertebral artery is the dominant vessel. 4.  Mild distal intracranial small vessel disease.   Original Report Authenticated By: Marin Roberts, M.D.   US Abdomen Limited  08/12/2012   *RADIOLOGY REPORT*  Clinical Data: Assess volume of ascites  LIMITED ABDOMEN ULTRASOUND FOR ASCITES  Technique:  Limited ultrasound survey for ascites was performed in all four abdominal quadrants.  Comparison:  None.  Findings: Four-quadrant survey views of the abdomen reveal no ascites.  IMPRESSION: No ascites identified   Original Report Authenticated By: Esperanza Heir, M.D.    Microbiology: No results found for this or any previous visit (from the past 240 hour(s)).   Labs: Basic Metabolic Panel:  Recent Labs Lab 08/09/12 0520 08/10/12 0543 08/11/12 0356 08/12/12 0714 08/13/12 0555  NA 123* 119* 119* 120* 127*  K 3.7 4.4 5.1 5.4* 4.1  CL 88* 85* 87* 89* 94*  CO2 28 26  26 26 28   GLUCOSE 314* 366* 377* 224* 172*   BUN 12 12 12 14 15   CREATININE 0.71 0.81 0.79 0.76 0.83  CALCIUM 8.5 8.5 8.5 8.1* 8.2*   Liver Function Tests:  Recent Labs Lab 08/09/12 0520 08/10/12 0543 08/13/12 0555  AST 64* 80* 71*  ALT 41 50 45  ALKPHOS 232* 260* 250*  BILITOT 4.9* 4.4* 3.0*  PROT 6.7 7.0 6.2  ALBUMIN 2.0* 2.1* 1.9*   No results found for this basename: LIPASE, AMYLASE,  in the last 168 hours  Recent Labs Lab 08/08/12 1330 08/09/12 0520 08/10/12 0543 08/12/12 0714  AMMONIA 240* 102* 82* 67*   CBC:  Recent Labs Lab 08/08/12 1250 08/09/12 0520 08/13/12 0555  WBC 4.4 4.1 3.9*  NEUTROABS 3.4  --   --   HGB 11.9* 11.2* 11.4*  HCT 32.5* 31.9* 32.1*  MCV 100.6* 102.2* 103.5*  PLT 39* 32* 36*   Cardiac Enzymes: No results found for this basename: CKTOTAL, CKMB, CKMBINDEX, TROPONINI,  in the last 168 hours BNP: BNP (last 3 results) No results found for this basename: PROBNP,  in the last 8760 hours CBG:  Recent Labs Lab 08/12/12 1205 08/12/12 1638 08/12/12 2211 08/13/12 0736 08/13/12 1201  GLUCAP 264* 205* 194* 197* 249*       Signed:  Caige Almeda  [facility] 08/13/2012, 12:26 PM

## 2012-08-14 ENCOUNTER — Other Ambulatory Visit: Payer: Self-pay | Admitting: Geriatric Medicine

## 2012-08-14 LAB — URINE CULTURE

## 2012-08-14 MED ORDER — GLUCOSE BLOOD VI STRP: ORAL_STRIP | Status: DC

## 2012-08-14 NOTE — Progress Notes (Signed)
Daughter called the office as patient did not receive insulin pen and test strips. Called his pharmacy ( [facility] [address]) and verified order. Insulin pen and test strip will be ready for patient to pick up this morning. Called daughter Tamera Punt to inform. She also informs looking into having patient placed in SNF.

## 2012-08-14 NOTE — Progress Notes (Signed)
Patient ID: Tony Boyd, male   DOB: January 07, 1956, 57 y.o.   MRN: [id number] Patient and daughter came in for the daughter to lean how to give her father insulin injections. Patient was discharged from the hospital on insulin no training. Came in with insulin pens and new monitor. To take Novolog with meals three times daily and Lantus at 10pm.  Told them that Dr. Renato Gails wants someone with him for the first few days to make sure he doesn't have any problems. Gave them print outs on hypoglycemia and hyperglycemia. The daughter said that it would be a problem, she would not be able to be with him 24 hours. They didn't want to start the insulin at this time. Will take his oral medications twice daily. Has appt tomorrow with Dr. Renato Gails. Daughter wants him in a nursing home to start the insulin until he is stable. Gave information about diabetic care, meals. Review the pens with the daughter and were and how to give the insulin pens.

## 2012-08-15 ENCOUNTER — Encounter: Payer: Self-pay | Admitting: Internal Medicine

## 2012-08-15 ENCOUNTER — Ambulatory Visit (INDEPENDENT_AMBULATORY_CARE_PROVIDER_SITE_OTHER): Payer: Medicare Other | Admitting: Internal Medicine

## 2012-08-15 VITALS — BP 136/78 | HR 95 | Temp 98.0°F | Resp 13 | Ht 68.0 in | Wt 274.0 lb

## 2012-08-15 DIAGNOSIS — D6959 Other secondary thrombocytopenia: Secondary | ICD-10-CM

## 2012-08-15 DIAGNOSIS — F1010 Alcohol abuse, uncomplicated: Secondary | ICD-10-CM

## 2012-08-15 DIAGNOSIS — M545 Low back pain, unspecified: Secondary | ICD-10-CM

## 2012-08-15 DIAGNOSIS — D696 Thrombocytopenia, unspecified: Secondary | ICD-10-CM

## 2012-08-15 DIAGNOSIS — F1096 Alcohol use, unspecified with alcohol-induced persisting amnestic disorder: Secondary | ICD-10-CM

## 2012-08-15 DIAGNOSIS — E1165 Type 2 diabetes mellitus with hyperglycemia: Secondary | ICD-10-CM

## 2012-08-15 DIAGNOSIS — E1149 Type 2 diabetes mellitus with other diabetic neurological complication: Secondary | ICD-10-CM

## 2012-08-15 DIAGNOSIS — E039 Hypothyroidism, unspecified: Secondary | ICD-10-CM

## 2012-08-15 DIAGNOSIS — K7290 Hepatic failure, unspecified without coma: Secondary | ICD-10-CM

## 2012-08-15 DIAGNOSIS — IMO0002 Reserved for concepts with insufficient information to code with codable children: Secondary | ICD-10-CM

## 2012-08-15 DIAGNOSIS — E1142 Type 2 diabetes mellitus with diabetic polyneuropathy: Secondary | ICD-10-CM

## 2012-08-15 DIAGNOSIS — K7031 Alcoholic cirrhosis of liver with ascites: Secondary | ICD-10-CM

## 2012-08-15 DIAGNOSIS — K7682 Hepatic encephalopathy: Secondary | ICD-10-CM

## 2012-08-15 DIAGNOSIS — K7030 Alcoholic cirrhosis of liver without ascites: Secondary | ICD-10-CM

## 2012-08-15 DIAGNOSIS — G40909 Epilepsy, unspecified, not intractable, without status epilepticus: Secondary | ICD-10-CM

## 2012-08-15 NOTE — Patient Instructions (Addendum)
Diabetes Meal Planning Guide The diabetes meal planning guide is a tool to help you plan your meals and snacks. It is important for people with diabetes to manage their blood glucose (sugar) levels. Choosing the right foods and the right amounts throughout your day will help control your blood glucose. Eating right can even help you improve your blood pressure and reach or maintain a healthy weight. CARBOHYDRATE COUNTING MADE EASY When you eat carbohydrates, they turn to sugar. This raises your blood glucose level. Counting carbohydrates can help you control this level so you feel better. When you plan your meals by counting carbohydrates, you can have more flexibility in what you eat and balance your medicine with your food intake. Carbohydrate counting simply means adding up the total amount of carbohydrate grams in your meals and snacks. Try to eat about the same amount at each meal. Foods with carbohydrates are listed below. Each portion below is 1 carbohydrate serving or 15 grams of carbohydrates. Ask your dietician how many grams of carbohydrates you should eat at each meal or snack. Grains and Starches  1 slice bread.   English muffin or hotdog/hamburger bun.   cup cold cereal (unsweetened).   cup cooked pasta or rice.   cup starchy vegetables (corn, potatoes, peas, beans, winter squash).  1 tortilla (6 inches).   bagel.  1 waffle or pancake (size of a CD).   cup cooked cereal.  4 to 6 small crackers. *Whole grain is recommended. Fruit  1 cup fresh unsweetened berries, melon, papaya, pineapple.  1 small fresh fruit.   banana or mango.   cup fruit juice (4 oz unsweetened).   cup canned fruit in natural juice or water.  2 tbs dried fruit.  12 to 15 grapes or cherries. Milk and Yogurt  1 cup fat-free or 1% milk.  1 cup soy milk.  6 oz light yogurt with sugar-free sweetener.  6 oz low-fat soy yogurt.  6 oz plain yogurt. Vegetables  1 cup raw or  cup  cooked is counted as 0 carbohydrates or a "free" food.  If you eat 3 or more servings at 1 meal, count them as 1 carbohydrate serving. Other Carbohydrates   oz chips or pretzels.   cup ice cream or frozen yogurt.   cup sherbet or sorbet.  2 inch square cake, no frosting.  1 tbs honey, sugar, jam, jelly, or syrup.  2 small cookies.  3 squares of graham crackers.  3 cups popcorn.  6 crackers.  1 cup broth-based soup.  Count 1 cup casserole or other mixed foods as 2 carbohydrate servings.  Foods with less than 20 calories in a serving may be counted as 0 carbohydrates or a "free" food. You may want to purchase a book or computer software that lists the carbohydrate gram counts of different foods. In addition, the nutrition facts panel on the labels of the foods you eat are a good source of this information. The label will tell you how big the serving size is and the total number of carbohydrate grams you will be eating per serving. Divide this number by 15 to obtain the number of carbohydrate servings in a portion. Remember, 1 carbohydrate serving equals 15 grams of carbohydrate. SERVING SIZES Measuring foods and serving sizes helps you make sure you are getting the right amount of food. The list below tells how big or small some common serving sizes are.  1 oz.........4 stacked dice.  3 oz.........Deck of cards.  1 tsp........Tip   of little finger.  1 tbs......Marland KitchenMarland KitchenThumb.  2 tbs.......Marland KitchenGolf ball.   cup......Marland KitchenHalf of a fist.  1 cup.......Marland KitchenA fist. SAMPLE DIABETES MEAL PLAN Below is a sample meal plan that includes foods from the grain and starches, dairy, vegetable, fruit, and meat groups. A dietician can individualize a meal plan to fit your calorie needs and tell you the number of servings needed from each food group. However, controlling the total amount of carbohydrates in your meal or snack is more important than making sure you include all of the food groups at every  meal. You may interchange carbohydrate containing foods (dairy, starches, and fruits). The meal plan below is an example of a 2000 calorie diet using carbohydrate counting. This meal plan has 17 carbohydrate servings. Breakfast  1 cup oatmeal (2 carb servings).   cup light yogurt (1 carb serving).  1 cup blueberries (1 carb serving).   cup almonds. Snack  1 large apple (2 carb servings).  1 low-fat string cheese stick. Lunch  Chicken breast salad.  1 cup spinach.   cup chopped tomatoes.  2 oz chicken breast, sliced.  2 tbs low-fat [place] dressing.  12 whole-wheat crackers (2 carb servings).  12 to 15 grapes (1 carb serving).  1 cup low-fat milk (1 carb serving). Snack  1 cup carrots.   cup hummus (1 carb serving). Dinner  3 oz broiled salmon.  1 cup brown rice (3 carb servings). Snack  1  cups steamed broccoli (1 carb serving) drizzled with 1 tsp olive oil and lemon juice.  1 cup light pudding (2 carb servings). DIABETES MEAL PLANNING WORKSHEET Your dietician can use this worksheet to help you decide how many servings of foods and what types of foods are right for you.  BREAKFAST Food Group and Servings / Carb Servings Grain/Starches __________________________________ Dairy __________________________________________ Vegetable ______________________________________ Fruit ___________________________________________ Meat __________________________________________ Fat ____________________________________________ LUNCH Food Group and Servings / Carb Servings Grain/Starches ___________________________________ Dairy ___________________________________________ Fruit ____________________________________________ Meat ___________________________________________ Fat _____________________________________________ Laural Golden Food Group and Servings / Carb Servings Grain/Starches ___________________________________ Dairy  ___________________________________________ Fruit ____________________________________________ Meat ___________________________________________ Fat _____________________________________________ SNACKS Food Group and Servings / Carb Servings Grain/Starches ___________________________________ Dairy ___________________________________________ Vegetable _______________________________________ Fruit ____________________________________________ Meat ___________________________________________ Fat _____________________________________________ DAILY TOTALS Starches _________________________ Vegetable ________________________ Fruit ____________________________ Dairy ____________________________ Meat ____________________________ Fat ______________________________ Document Released: 11/09/2004 Document Revised: 05/07/2011 Document Reviewed: 09/20/2008 [employer] Patient Information 2014 [employer], Arsenio Loader. 2000 Calorie Diabetic Diet The 2000 calorie diabetic diet is designed for eating up to 2000 calories each day. Following this diet and making healthy meal choices can help improve overall health. It controls blood glucose (sugar) levels. It can also lower blood pressure and cholesterol. SERVING SIZES Measuring foods and serving sizes helps to make sure you are getting the right amount of food. The list below tells how big or small some common serving sizes are.  1 oz.........4 stacked dice.  3 oz........Marland KitchenDeck of cards.  1 tsp.......Marland KitchenTip of little finger.  1 tbs......Marland KitchenMarland KitchenThumb.  2 tbs.......Marland KitchenGolf ball.   cup......Marland KitchenHalf of a fist.  1 cup.......Marland KitchenA fist. GUIDELINES FOR CHOOSING FOODS The goal of this diet is to eat a variety of foods and limit calories to 2000 each day. This can be done by choosing foods that are low in calories and fat. The diet also suggests eating small amounts of food often. Doing this helps control your blood glucose levels so they do not get too high or too low. Each meal  or snack should contain a protein food source to help you feel more satisfied  and to stabilize your blood glucose. Try to eat about the same amount of food around the same time each day. This includes weekend days, travel days, and days off work. Space your meals about 4 to 5 hours apart and add a snack between them if you wish. For example, a daily food plan could include breakfast, a morning snack, lunch, dinner, and an evening snack. Healthy meals and snacks include whole grains, vegetables, fruits, lean meats, poultry, fish, and dairy products. As you plan your meals, choose a variety of foods. Choose from the bread and starches, vegetables, fruit, dairy, and meat/protein groups. Examples of foods from each group are listed below with their suggested serving sizes. Use measuring cups and spoons to become familiar with what a healthy portion looks like. Bread and Starches Each serving equals 15 grams of carbohydrates.  1 slice bread.   bagel.   cup or 1 cup cold cereal (unsweetened).   cup hot cereal or mashed potatoes.  1 small potato (size of a computer mouse).   cup cooked pasta or rice.   English muffin.  1 cup broth-based soup.  3 cups popcorn.  4 to 6 whole-wheat crackers.   cup cooked beans, peas, or corn. Vegetables Each serving equals 5 grams of carbohydrates.   cup cooked vegetables.  1 cup raw vegetables.   cup tomato juice. Fruit Each serving equals 15 grams of carbohydrates.  1 small apple, banana, or orange.  1  cup watermelon or strawberries.   cup applesauce (no sugar added).  2 tbs raisins.   banana.   cup unsweetened canned fruit.   cup unsweetened fruit juice. Dairy Each serving equals 12 to 15 grams of carbohydrates.  1 cup fat-free milk.  6 oz artificially sweetened yogurt.  1 cup buttermilk.  1 cup soy milk. Meat/Protein  1 large egg.  2 to 3 oz meat, poultry, or fish.   cup cottage cheese.  1 tbs peanut  butter.   cup tofu.  1 oz cheese.   cup tuna canned in water. SAMPLE 2000 CALORIE DIET PLAN Breakfast  1 English muffin (2 carb servings).  Reduced fat cream cheese, 1 tbs.  1 scrambled egg.   grapefruit (1 carb serving).  Fat-free milk, 1 cup (1 carb serving). Morning Snack  Artificially sweetened yogurt, 6 oz (1 carb serving).  2 tbs chopped nuts.  1 small peach (1 carb serving). Lunch  Grilled chicken sandwich.  1 hamburger bun (2 carb servings).  2 oz chicken breast.  1 lettuce leaf.  2 slices tomato.  Reduced fat mayonnaise, 1 tbs.  Carrot sticks, 1 cup.  Celery, 1 cup.  1 small apple (1 carb serving).  Fat-free milk, 1 cup (1 carb serving). Afternoon Snack   cup low-fat cottage cheese.  1  cups strawberries (1 carb serving). Dinner  Steak fajitas.  2 oz lean steak.  1 whole-wheat tortilla, 8 inches (1  carb servings).  Shredded lettuce,  cup.  2 slices tomato.  Salsa,  cup.  Low-fat sour cream, 2 tbs.  Brown rice,  cup (1 carb serving).  1 small orange (1 carb serving). Evening Snack  4 reduced fat whole-wheat crackers (1 carb serving).  1 tbs peanut butter.  12 to 15 grapes (1 carb serving). MEAL PLAN Use this worksheet to help you make a daily meal plan based on the 2000 calorie diabetic diet suggestions. The total amount of carbohydrates in your meal or snack is more important than making sure you include  all of the food groups at every meal or snack. If you are using this plan to help you control your blood glucose, you may interchange carbohydrate containing foods (dairy, starches, and fruits). Choose a variety of fresh foods of varying colors and flavors. You can choose from the following foods to build your day's meals:  11 Starches.  4 Vegetables.  3 Fruits.  3 Dairy.  8 oz Meat.  Up to 6 Fats. Your dietician can use this worksheet to help you decide how many servings and what types of foods are right  for you. BREAKFAST Food Group and Servings / Food Choice Starches ___________________________________________ Dairy ______________________________________________ Fruit ______________________________________________ Meat ______________________________________________ Fat________________________________________________ LUNCH Food Group and Servings / Food Choice Starch _____________________________________________ Meat ______________________________________________ Vegetables _________________________________________ Fruit ______________________________________________ Dairy______________________________________________ Fat________________________________________________ Aura Fey Food Group and Servings / Food Choice Starch________________________________________________ Meat_________________________________________________ Fruit__________________________________________________ Linford Arnold Group and Servings / Food Choice Starches ____________________________________________ Meat _______________________________________________ Dairy _______________________________________________ Vegetables __________________________________________ Fruit ________________________________________________ Fat_________________________________________________ Lollie Sails Food Group and Servings / Food Choice Fruit _______________________________________________ Meat _______________________________________________ Starch ______________________________________________ DAILY TOTALS Starches ________________________ Vegetables ______________________ Fruit ___________________________ Dairy ___________________________ Meat ___________________________ Fat _____________________________ Document Released: 09/04/2004 Document Revised: 05/07/2011 Document Reviewed: 09/20/2008 [employer] Patient Information 2014 [employer]  USE METFORMIN 1000 MG BY MOUTH TWICE DAILY WITH MEALS UNTIL ADMISSION  TO SNF.

## 2012-08-15 NOTE — Progress Notes (Signed)
Patient ID: Tony Boyd, male   DOB: Sep 04, 1955, 57 y.o.   MRN: [id number] Location:  [facility] / [facility]  Allergies  Allergen Reactions  . Ativan (Lorazepam) Other (See Comments)    "hallucinations"  . Droperidol Other (See Comments)    unknown  . Ketorolac Other (See Comments)    unknown  . Penicillins Swelling and Other (See Comments)    unkown  . Toradol (Ketorolac Tromethamine) Hives    Chief Complaint  Patient presents with  . Blood sugar running high to discuss diabetes  . Hospitalization Follow-up    HPI: Patient is a 57 y.o. white male seen in the office today for recent onset uncontrolled type 2 diabetes education and management.   Daughter was about to pick up metformin when she found him minimally responsive. He went in Friday and they were going to send him home on the pills, but inevitably will need insulin.  Or could send home with insulin, and daughter could not give him insulin regularly multiple times per day.  Then different doctor discharged him and called in two different insulins and no test strips.  Now, we'd like to get him to a facility to get started on insulin b/c though his daughter is trying to help him at home, he still lives by himself in independent living senior apts and cannot do this on his own.    Review of Systems:  Review of Systems  Constitutional: Negative for fever and chills.  HENT: Negative for congestion.   Eyes: Negative for blurred vision.  Respiratory: Negative for shortness of breath.   Cardiovascular: Negative for chest pain.  Gastrointestinal: Negative for abdominal pain.  Genitourinary: Negative for dysuria.  Musculoskeletal: Positive for back pain and falls.  Skin: Negative for rash.  Neurological: Positive for seizures and loss of consciousness.  Endo/Heme/Allergies: Bruises/bleeds easily.  Psychiatric/Behavioral: Positive for memory loss.     Past Medical History  Diagnosis Date  .  Cirrhosis of liver   . Hepatitis C   . ETOH abuse   . Hypothyroid   . Psychosis   . Bipolar disorder   . Seizures   . Arthritis   . Back pain   . Coagulopathy     Hx of  . Thrombocytopenia     Hx of  . Pancytopenia     Hx of  . H/O hypokalemia   . Hyponatremia     Hx of  . Korsakoff psychosis   . H/O abdominal abscess   . H/O esophageal varices   . Metabolic encephalopathy   . Hepatic encephalopathy   . Urinary urgency   . H/O renal failure   . H/O ascites   . Altered mental status   . Anemia   . Ascites   . Shortness of breath   . Peripheral vascular disease   . GERD (gastroesophageal reflux disease)   . Thrombocytopenia, acquired 06/02/2012  . Unspecified hypothyroidism 06/02/2012  . Pain in joint, pelvic region and thigh   . Edema   . Obesity, unspecified   . Chest pain, unspecified   . Pneumonitis due to inhalation of food or vomitus   . Other convulsions   . Cellulitis and abscess of upper arm and forearm   . Chronic hepatitis C without mention of hepatic coma   . Hypopotassemia   . Anemia, unspecified   . Other and unspecified coagulation defects   . Thrombocytopenia, unspecified   . Bipolar I disorder, most recent episode (or  current) unspecified   . Unspecified psychosis   . Encephalopathy   . Esophageal varices without mention of bleeding   . Esophagitis, unspecified   . Abscess of liver(572.0)   . Chronic kidney disease, unspecified   . Lumbago   . Personal history of alcoholism   . Personal history of tobacco use, presenting hazards to health   . Cirrhosis of liver without mention of alcohol   . Pneumonitis due to inhalation of food or vomitus 09/19/2010  . Chronic hepatitis C without mention of hepatic coma   . Bipolar I disorder, most recent episode (or current) unspecified   . Unspecified psychosis   . Other ascites   . Personal history of tobacco use, presenting hazards to health   . Cirrhosis of liver without mention of alcohol   . Type 2  diabetes mellitus with diabetic neuropathy     Past Surgical History  Procedure Laterality Date  . Colostomy    . Cholecystectomy    . Small intestine surgery    . Arm surgery      left arm  . US guided drain placement in ventral hernia abscess  07/21/2010  . Multiple picc line placements    . Esophagogastroduodenoscopy  06/28/2011    Procedure: ESOPHAGOGASTRODUODENOSCOPY (EGD);  Surgeon: Louis Meckel, MD;  Location: [facility];  Service: Endoscopy;  Laterality: N/A;  . Esophagogastroduodenoscopy N/A 05/06/2012    Procedure: ESOPHAGOGASTRODUODENOSCOPY (EGD);  Surgeon: Louis Meckel, MD;  Location: [facility];  Service: Endoscopy;  Laterality: N/A;  . Esophageal banding N/A 05/06/2012    Procedure: ESOPHAGEAL BANDING;  Surgeon: Louis Meckel, MD;  Location: [facility];  Service: Endoscopy;  Laterality: N/A;    Social History:   reports that he quit smoking about 4 years ago. His smoking use included Cigarettes. He smoked 0.00 packs per day. He has never used smokeless tobacco. He reports that he does not drink alcohol or use illicit drugs.  Family History  Problem Relation Age of Onset  . Heart disease Mother     MI  . Lung cancer Brother     Medications: Patient's Medications  New Prescriptions   No medications on file  Previous Medications   ALBUTEROL (PROVENTIL HFA;VENTOLIN HFA) 108 (90 BASE) MCG/ACT INHALER    Inhale 2 puffs into the lungs 2 (two) times daily as needed for wheezing.    FERROUS SULFATE 325 (65 FE) MG TABLET    Take 325 mg by mouth at bedtime.   FOLIC ACID (FOLVITE) 1 MG TABLET    Take 1 mg by mouth daily.   FUROSEMIDE (LASIX) 80 MG TABLET    Take 80 mg by mouth 2 (two) times daily.   GLUCOSE BLOOD (ACCU-CHEK ACTIVE STRIPS) TEST STRIP    Use as instructed 3 times daily to help control diabetes. 250.00   GLUCOSE MONITORING KIT (FREESTYLE) MONITORING KIT    1 each by Does not apply route as needed for other. Check sugar 4 times daily, once before each  meal and at bedtime   GLUCOSE MONITORING KIT (FREESTYLE) MONITORING KIT    1 each by Does not apply route as needed for other.   INSULIN ASPART (NOVOLOG) 100 UNIT/ML INJECTION    Inject 5 Units into the skin 3 (three) times daily with meals.   INSULIN GLARGINE (LANTUS) 100 UNITS/ML SOLN    Inject 60 Units into the skin daily at 10 pm.   LACTULOSE (CHRONULAC) 10 GM/15ML SOLUTION    Take 30 g by mouth  5 (five) times daily.   LANCETS (ACCU-CHEK MULTICLIX) LANCETS    Use as instructed   LANCETS (ONETOUCH ULTRASOFT) LANCETS    Use as instructed   LEVETIRACETAM (KEPPRA) 500 MG TABLET    Take 500 mg by mouth 3 (three) times daily.   LEVOTHYROXINE (SYNTHROID, LEVOTHROID) 75 MCG TABLET    Take 75 mcg by mouth daily.   MULTIPLE VITAMIN (MULTIVITAMIN WITH MINERALS) TABS    Take 1 tablet by mouth daily.   OMEPRAZOLE (PRILOSEC) 20 MG CAPSULE    Take 20 mg by mouth daily.   OXYCODONE HCL 10 MG TABS    Take one tablet every 4 hours as needed for pain   RIFAXIMIN (XIFAXAN) 550 MG TABS    Take 550 mg by mouth 2 (two) times daily.   RISPERIDONE (RISPERDAL) 0.25 MG TABLET    Take 0.25 mg by mouth 2 (two) times daily.   SPIRONOLACTONE (ALDACTONE) 100 MG TABLET    Take 200 mg by mouth daily.  Modified Medications   No medications on file  Discontinued Medications   OXYCODONE (OXY IR/ROXICODONE) 5 MG IMMEDIATE RELEASE TABLET    Take 1 tablet (5 mg total) by mouth every 6 (six) hours as needed for pain.     Physical Exam: Filed Vitals:   08/15/12 0954  BP: 136/78  Pulse: 95  Temp: 98 F (36.7 C)  TempSrc: Oral  Resp: 13  Height: 5\' 8"  (1.727 m)  Weight: 274 lb (124.286 kg)  Physical Exam  Constitutional:  Obese white male, jaundiced with scleral icterus  HENT:  Head: Normocephalic and atraumatic.  Cardiovascular: Normal rate, regular rhythm and normal heart sounds.   Legs too edematous to feel pulses  Pulmonary/Chest: Effort normal. He has rales.  Abdominal: Soft. Bowel sounds are normal. He  exhibits no distension. There is no tenderness.  Musculoskeletal: Normal range of motion.  Walks with rollator walker  Neurological: He is alert.  Confused, lies about drinking and taking lactulose, changes story throughout visit  Skin:  Chronic venous stasis changes of legs     Labs reviewed: Basic Metabolic Panel:  Recent Labs  54/09/81 0455  08/11/12 0356 08/12/12 0714 08/12/12 1135 08/13/12 0555  NA 128*  < > 119* 120*  --  127*  K 4.6  < > 5.1 5.4*  --  4.1  CL 98  < > 87* 89*  --  94*  CO2 21  < > 26 26  --  28  GLUCOSE 159*  < > 377* 224*  --  172*  BUN 7  < > 12 14  --  15  CREATININE 0.61  < > 0.79 0.76  --  0.83  CALCIUM 8.0*  < > 8.5 8.1*  --  8.2*  TSH 1.403  --   --   --  2.323  --   < > = values in this interval not displayed. Liver Function Tests:  Recent Labs  08/09/12 0520 08/10/12 0543 08/13/12 0555  AST 64* 80* 71*  ALT 41 50 45  ALKPHOS 232* 260* 250*  BILITOT 4.9* 4.4* 3.0*  PROT 6.7 7.0 6.2  ALBUMIN 2.0* 2.1* 1.9*   No results found for this basename: LIPASE, AMYLASE,  in the last 8760 hours  Recent Labs  08/09/12 0520 08/10/12 0543 08/12/12 0714  AMMONIA 102* 82* 67*   CBC:  Recent Labs  05/23/12 1143  07/25/12 1831  08/08/12 1250 08/09/12 0520 08/13/12 0555  WBC 5.7  < > 8.0  --  4.4 4.1 3.9*  NEUTROABS 4.0  --  6.5  --  3.4  --   --   HGB 12.2*  < > 12.7*  < > 11.9* 11.2* 11.4*  HCT 34.3*  < > 35.9*  < > 32.5* 31.9* 32.1*  MCV 101.5*  < > 101.4*  --  100.6* 102.2* 103.5*  PLT 48*  < > 38*  --  39* 32* 36*  < > = values in this interval not displayed.  Lab Results  Component Value Date   HGBA1C 10.1* 07/31/2012   hospitalization procedures were reviewed  Assessment/.Plan 1. 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 Alcoholic cirrhosis of liver with ascites -had one recent episodes where he was still dirnking wine -is late stage cirrhosis with prognosis of less than 6 mos -needs assistance with meds due to his fluctuating mentation  4. Hepatic encephalopathy -due to alcoholic cirrhosis and nonadherence with lactulose and xifaxin despite his daughter's best attempts  5. Seizure disorder -probably was due to alcohol withdrawal at the time, but is now on keppra 500mg  po tid--continue this  6. 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 Low back pain -not currently on any pain meds for this  8. Unspecified hypothyroidism -cont synthroid 75 mcg daily first thing in am on empty stomach  9. ETOH abuse -claims he did not drink recently--one episode known where he was drinking wine  Next appt:  Will see in SNF

## 2012-08-17 NOTE — ED Provider Notes (Signed)
Medical screening examination/treatment/procedure(s) were performed by non-physician practitioner and as supervising physician I was immediately available for consultation/collaboration.   Keston Seever E Austina Constantin, MD 08/17/12 1102 

## 2012-08-21 ENCOUNTER — Inpatient Hospital Stay (HOSPITAL_COMMUNITY): Payer: Medicare Other

## 2012-08-21 ENCOUNTER — Non-Acute Institutional Stay (SKILLED_NURSING_FACILITY): Payer: Medicare Other | Admitting: Internal Medicine

## 2012-08-21 ENCOUNTER — Emergency Department (HOSPITAL_COMMUNITY): Payer: Medicare Other

## 2012-08-21 ENCOUNTER — Inpatient Hospital Stay (HOSPITAL_COMMUNITY)
Admission: EM | Admit: 2012-08-21 | Discharge: 2012-08-29 | DRG: 442 | Disposition: A | Discharge status: Skilled Nursing Facility | Payer: Medicare Other | Attending: Internal Medicine | Admitting: Internal Medicine

## 2012-08-21 ENCOUNTER — Encounter (HOSPITAL_COMMUNITY): Payer: Self-pay | Admitting: *Deleted

## 2012-08-21 DIAGNOSIS — E118 Type 2 diabetes mellitus with unspecified complications: Secondary | ICD-10-CM

## 2012-08-21 DIAGNOSIS — J90 Pleural effusion, not elsewhere classified: Secondary | ICD-10-CM | POA: Diagnosis present

## 2012-08-21 DIAGNOSIS — Z87891 Personal history of nicotine dependence: Secondary | ICD-10-CM

## 2012-08-21 DIAGNOSIS — Z91199 Patient's noncompliance with other medical treatment and regimen due to unspecified reason: Secondary | ICD-10-CM

## 2012-08-21 DIAGNOSIS — F1020 Alcohol dependence, uncomplicated: Secondary | ICD-10-CM | POA: Diagnosis present

## 2012-08-21 DIAGNOSIS — R55 Syncope and collapse: Secondary | ICD-10-CM

## 2012-08-21 DIAGNOSIS — B957 Other staphylococcus as the cause of diseases classified elsewhere: Secondary | ICD-10-CM | POA: Diagnosis present

## 2012-08-21 DIAGNOSIS — K7682 Hepatic encephalopathy: Secondary | ICD-10-CM | POA: Diagnosis present

## 2012-08-21 DIAGNOSIS — Z6837 Body mass index (BMI) 37.0-37.9, adult: Secondary | ICD-10-CM

## 2012-08-21 DIAGNOSIS — I8510 Secondary esophageal varices without bleeding: Secondary | ICD-10-CM | POA: Diagnosis present

## 2012-08-21 DIAGNOSIS — Z9119 Patient's noncompliance with other medical treatment and regimen: Secondary | ICD-10-CM

## 2012-08-21 DIAGNOSIS — R531 Weakness: Secondary | ICD-10-CM

## 2012-08-21 DIAGNOSIS — D684 Acquired coagulation factor deficiency: Secondary | ICD-10-CM | POA: Diagnosis present

## 2012-08-21 DIAGNOSIS — K922 Gastrointestinal hemorrhage, unspecified: Secondary | ICD-10-CM

## 2012-08-21 DIAGNOSIS — F1010 Alcohol abuse, uncomplicated: Secondary | ICD-10-CM

## 2012-08-21 DIAGNOSIS — R5381 Other malaise: Secondary | ICD-10-CM

## 2012-08-21 DIAGNOSIS — I8500 Esophageal varices without bleeding: Secondary | ICD-10-CM

## 2012-08-21 DIAGNOSIS — D696 Thrombocytopenia, unspecified: Secondary | ICD-10-CM

## 2012-08-21 DIAGNOSIS — K766 Portal hypertension: Secondary | ICD-10-CM | POA: Diagnosis present

## 2012-08-21 DIAGNOSIS — R188 Other ascites: Secondary | ICD-10-CM

## 2012-08-21 DIAGNOSIS — K7030 Alcoholic cirrhosis of liver without ascites: Secondary | ICD-10-CM | POA: Diagnosis present

## 2012-08-21 DIAGNOSIS — R410 Disorientation, unspecified: Secondary | ICD-10-CM

## 2012-08-21 DIAGNOSIS — R112 Nausea with vomiting, unspecified: Secondary | ICD-10-CM | POA: Diagnosis not present

## 2012-08-21 DIAGNOSIS — E039 Hypothyroidism, unspecified: Secondary | ICD-10-CM

## 2012-08-21 DIAGNOSIS — K7290 Hepatic failure, unspecified without coma: Secondary | ICD-10-CM | POA: Diagnosis present

## 2012-08-21 DIAGNOSIS — Z794 Long term (current) use of insulin: Secondary | ICD-10-CM

## 2012-08-21 DIAGNOSIS — G9340 Encephalopathy, unspecified: Secondary | ICD-10-CM

## 2012-08-21 DIAGNOSIS — E7220 Disorder of urea cycle metabolism, unspecified: Secondary | ICD-10-CM

## 2012-08-21 DIAGNOSIS — Z79899 Other long term (current) drug therapy: Secondary | ICD-10-CM

## 2012-08-21 DIAGNOSIS — E119 Type 2 diabetes mellitus without complications: Secondary | ICD-10-CM

## 2012-08-21 DIAGNOSIS — G40909 Epilepsy, unspecified, not intractable, without status epilepticus: Secondary | ICD-10-CM | POA: Diagnosis present

## 2012-08-21 DIAGNOSIS — E871 Hypo-osmolality and hyponatremia: Secondary | ICD-10-CM | POA: Diagnosis present

## 2012-08-21 DIAGNOSIS — R569 Unspecified convulsions: Secondary | ICD-10-CM

## 2012-08-21 DIAGNOSIS — IMO0002 Reserved for concepts with insufficient information to code with codable children: Secondary | ICD-10-CM

## 2012-08-21 DIAGNOSIS — F319 Bipolar disorder, unspecified: Secondary | ICD-10-CM | POA: Diagnosis present

## 2012-08-21 DIAGNOSIS — D689 Coagulation defect, unspecified: Secondary | ICD-10-CM

## 2012-08-21 DIAGNOSIS — N390 Urinary tract infection, site not specified: Secondary | ICD-10-CM | POA: Diagnosis present

## 2012-08-21 DIAGNOSIS — E1149 Type 2 diabetes mellitus with other diabetic neurological complication: Secondary | ICD-10-CM | POA: Diagnosis present

## 2012-08-21 DIAGNOSIS — B182 Chronic viral hepatitis C: Principal | ICD-10-CM | POA: Diagnosis present

## 2012-08-21 DIAGNOSIS — D539 Nutritional anemia, unspecified: Secondary | ICD-10-CM

## 2012-08-21 DIAGNOSIS — K7460 Unspecified cirrhosis of liver: Secondary | ICD-10-CM

## 2012-08-21 DIAGNOSIS — R4182 Altered mental status, unspecified: Secondary | ICD-10-CM

## 2012-08-21 DIAGNOSIS — E1142 Type 2 diabetes mellitus with diabetic polyneuropathy: Secondary | ICD-10-CM | POA: Diagnosis present

## 2012-08-21 DIAGNOSIS — K319 Disease of stomach and duodenum, unspecified: Secondary | ICD-10-CM | POA: Diagnosis present

## 2012-08-21 LAB — CBC WITH DIFFERENTIAL/PLATELET
Basophils Absolute: 0 10*3/uL (ref 0.0–0.1)
Basophils Relative: 0 % (ref 0–1)
Eosinophils Absolute: 0.1 10*3/uL (ref 0.0–0.7)
Eosinophils Relative: 1 % (ref 0–5)
HCT: 31.4 % — ABNORMAL LOW (ref 39.0–52.0)
Hemoglobin: 11.6 g/dL — ABNORMAL LOW (ref 13.0–17.0)
Lymphocytes Relative: 8 % — ABNORMAL LOW (ref 12–46)
Lymphs Abs: 0.5 10*3/uL — ABNORMAL LOW (ref 0.7–4.0)
MCH: 36.8 pg — ABNORMAL HIGH (ref 26.0–34.0)
MCHC: 36.9 g/dL — ABNORMAL HIGH (ref 30.0–36.0)
MCV: 99.7 fL (ref 78.0–100.0)
Monocytes Absolute: 0.2 10*3/uL (ref 0.1–1.0)
Monocytes Relative: 3 % (ref 3–12)
Neutro Abs: 5.1 10*3/uL (ref 1.7–7.7)
Neutrophils Relative %: 88 % — ABNORMAL HIGH (ref 43–77)
Platelets: 53 10*3/uL — ABNORMAL LOW (ref 150–400)
RBC: 3.15 MIL/uL — ABNORMAL LOW (ref 4.22–5.81)
RDW: 14.8 % (ref 11.5–15.5)
WBC: 5.9 10*3/uL (ref 4.0–10.5)

## 2012-08-21 LAB — URINALYSIS, ROUTINE W REFLEX MICROSCOPIC
Bilirubin Urine: NEGATIVE
Glucose, UA: NEGATIVE mg/dL
Hgb urine dipstick: NEGATIVE
Ketones, ur: NEGATIVE mg/dL
Nitrite: NEGATIVE
Protein, ur: NEGATIVE mg/dL
Specific Gravity, Urine: 1.01 (ref 1.005–1.030)
Urobilinogen, UA: 1 mg/dL (ref 0.0–1.0)
pH: 6 (ref 5.0–8.0)

## 2012-08-21 LAB — COMPREHENSIVE METABOLIC PANEL
ALT: 53 U/L (ref 0–53)
AST: 112 U/L — ABNORMAL HIGH (ref 0–37)
Albumin: 2.2 g/dL — ABNORMAL LOW (ref 3.5–5.2)
Alkaline Phosphatase: 248 U/L — ABNORMAL HIGH (ref 39–117)
BUN: 17 mg/dL (ref 6–23)
CO2: 29 mEq/L (ref 19–32)
Calcium: 8.3 mg/dL — ABNORMAL LOW (ref 8.4–10.5)
Chloride: 85 mEq/L — ABNORMAL LOW (ref 96–112)
Creatinine, Ser: 0.89 mg/dL (ref 0.50–1.35)
GFR calc Af Amer: >90 mL/min (ref >90)
GFR calc non Af Amer: >90 mL/min (ref >90)
Glucose, Bld: 174 mg/dL — ABNORMAL HIGH (ref 70–99)
Potassium: 4.9 mEq/L (ref 3.5–5.1)
Sodium: 119 mEq/L — CL (ref 135–145)
Total Bilirubin: 3.4 mg/dL — ABNORMAL HIGH (ref 0.3–1.2)
Total Protein: 7.2 g/dL (ref 6.0–8.3)

## 2012-08-21 LAB — PROTIME-INR
INR: 1.53 — ABNORMAL HIGH (ref 0.00–1.49)
Prothrombin Time: 18 seconds — ABNORMAL HIGH (ref 11.6–15.2)

## 2012-08-21 LAB — URINE MICROSCOPIC-ADD ON

## 2012-08-21 LAB — HEMOGLOBIN AND HEMATOCRIT, BLOOD: HCT: 31.6 % — ABNORMAL LOW (ref 39.0–52.0)

## 2012-08-21 LAB — GLUCOSE, CAPILLARY
Glucose-Capillary: 156 mg/dL — ABNORMAL HIGH (ref 70–99)
Glucose-Capillary: 163 mg/dL — ABNORMAL HIGH (ref 70–99)

## 2012-08-21 LAB — AMMONIA: Ammonia: 178 umol/L — ABNORMAL HIGH (ref 11–60)

## 2012-08-21 MED ORDER — ONDANSETRON HCL 4 MG PO TABS
4.0000 mg | ORAL_TABLET | Freq: Four times a day (QID) | ORAL | Status: DC | PRN
  Administered 2012-08-24: 4 mg via ORAL
  Filled 2012-08-21: qty 1

## 2012-08-21 MED ORDER — CIPROFLOXACIN IN D5W 400 MG/200ML IV SOLN
400.0000 mg | Freq: Once | INTRAVENOUS | Status: DC
  Administered 2012-08-21: 400 mg via INTRAVENOUS
  Filled 2012-08-21: qty 200

## 2012-08-21 MED ORDER — ALUM & MAG HYDROXIDE-SIMETH 200-200-20 MG/5ML PO SUSP: 30.0000 mL | Freq: Four times a day (QID) | ORAL | Status: DC | PRN

## 2012-08-21 MED ORDER — PANTOPRAZOLE SODIUM 40 MG IV SOLR: 40.0000 mg | INTRAVENOUS | Status: DC

## 2012-08-21 MED ORDER — SODIUM CHLORIDE 0.9 % IJ SOLN: 3.0000 mL | INTRAMUSCULAR | Status: DC | PRN

## 2012-08-21 MED ORDER — LACTULOSE ENEMA
300.0000 mL | Freq: Four times a day (QID) | ORAL | Status: DC
  Filled 2012-08-21 (×3): qty 300

## 2012-08-21 MED ORDER — LACTULOSE 10 GM/15ML PO SOLN
20.0000 g | Freq: Four times a day (QID) | ORAL | Status: DC
  Filled 2012-08-21 (×2): qty 30

## 2012-08-21 MED ORDER — SODIUM CHLORIDE 0.9 % IJ SOLN
3.0000 mL | Freq: Two times a day (BID) | INTRAMUSCULAR | Status: DC
  Administered 2012-08-21: 3 mL via INTRAVENOUS

## 2012-08-21 MED ORDER — SODIUM CHLORIDE 0.9 % IJ SOLN: 3.0000 mL | Freq: Two times a day (BID) | INTRAMUSCULAR | Status: DC

## 2012-08-21 MED ORDER — ALBUTEROL SULFATE (5 MG/ML) 0.5% IN NEBU
2.5000 mg | INHALATION_SOLUTION | Freq: Four times a day (QID) | RESPIRATORY_TRACT | Status: DC | PRN
  Administered 2012-08-23: 2.5 mg via RESPIRATORY_TRACT
  Filled 2012-08-21: qty 0.5

## 2012-08-21 MED ORDER — SODIUM CHLORIDE 0.9 % IV SOLN
500.0000 mg | Freq: Three times a day (TID) | INTRAVENOUS | Status: DC
  Administered 2012-08-21 – 2012-08-22 (×3): 500 mg via INTRAVENOUS
  Filled 2012-08-21 (×6): qty 5

## 2012-08-21 MED ORDER — LACTULOSE 10 GM/15ML PO SOLN
20.0000 g | Freq: Four times a day (QID) | ORAL | Status: DC
  Filled 2012-08-21: qty 30

## 2012-08-21 MED ORDER — CIPROFLOXACIN IN D5W 400 MG/200ML IV SOLN
400.0000 mg | Freq: Two times a day (BID) | INTRAVENOUS | Status: DC
  Administered 2012-08-21 – 2012-08-22 (×2): 400 mg via INTRAVENOUS
  Filled 2012-08-21 (×4): qty 200

## 2012-08-21 MED ORDER — LEVOTHYROXINE SODIUM 100 MCG IV SOLR
50.0000 ug | Freq: Every day | INTRAVENOUS | Status: DC
  Administered 2012-08-21 – 2012-08-22 (×2): 50 ug via INTRAVENOUS
  Filled 2012-08-21 (×2): qty 5

## 2012-08-21 MED ORDER — INSULIN GLARGINE 100 UNITS/ML SOLOSTAR PEN: 30.0000 [IU] | PEN_INJECTOR | Freq: Every day | SUBCUTANEOUS | Status: DC

## 2012-08-21 MED ORDER — LACTULOSE 10 GM/15ML PO SOLN: 10.0000 g | Freq: Once | ORAL | Status: DC

## 2012-08-21 MED ORDER — PANTOPRAZOLE SODIUM 40 MG PO TBEC
40.0000 mg | DELAYED_RELEASE_TABLET | Freq: Every day | ORAL | Status: DC
  Administered 2012-08-22 – 2012-08-26 (×5): 40 mg via ORAL
  Filled 2012-08-21 (×5): qty 1

## 2012-08-21 MED ORDER — LACTULOSE 10 GM/15ML PO SOLN
60.0000 g | Freq: Once | ORAL | Status: AC
  Administered 2012-08-21: 60 g via ORAL
  Filled 2012-08-21: qty 90

## 2012-08-21 MED ORDER — INSULIN GLARGINE 100 UNIT/ML ~~LOC~~ SOLN
30.0000 [IU] | Freq: Every day | SUBCUTANEOUS | Status: DC
  Administered 2012-08-21: 30 [IU] via SUBCUTANEOUS
  Filled 2012-08-21 (×2): qty 0.3

## 2012-08-21 MED ORDER — SODIUM CHLORIDE 0.9 % IV SOLN: 250.0000 mL | INTRAVENOUS | Status: DC | PRN

## 2012-08-21 MED ORDER — ONDANSETRON HCL 4 MG/2ML IJ SOLN: 4.0000 mg | Freq: Four times a day (QID) | INTRAMUSCULAR | Status: DC | PRN

## 2012-08-21 MED ORDER — INSULIN ASPART 100 UNIT/ML ~~LOC~~ SOLN
0.0000 [IU] | SUBCUTANEOUS | Status: DC
  Administered 2012-08-21: 3 [IU] via SUBCUTANEOUS

## 2012-08-21 MED ORDER — LACTULOSE 10 GM/15ML PO SOLN
30.0000 g | ORAL | Status: DC
  Filled 2012-08-21: qty 45

## 2012-08-21 MED ORDER — FUROSEMIDE 10 MG/ML IJ SOLN
80.0000 mg | Freq: Two times a day (BID) | INTRAMUSCULAR | Status: DC
  Administered 2012-08-22 – 2012-08-23 (×3): 80 mg via INTRAVENOUS
  Filled 2012-08-21 (×5): qty 8

## 2012-08-21 NOTE — ED Notes (Signed)
Left upper arm swollen at site of IV-- IV stopped- saline locked -- no blood return noted-

## 2012-08-21 NOTE — ED Provider Notes (Signed)
History    CSN: [id number] Arrival date & time 08/21/12  1031  First MD Initiated Contact with Patient 08/21/12 1048     Chief Complaint  Patient presents with  . Altered Mental Status   (Consider location/radiation/quality/duration/timing/severity/associated sxs/prior Treatment) HPI Past Medical History  Diagnosis Date  . Cirrhosis of liver   . Hepatitis C   . ETOH abuse   . Hypothyroid   . Psychosis   . Bipolar disorder   . Seizures   . Arthritis   . Back pain   . Coagulopathy     Hx of  . Thrombocytopenia     Hx of  . Pancytopenia     Hx of  . H/O hypokalemia   . Hyponatremia     Hx of  . Korsakoff psychosis   . H/O abdominal abscess   . H/O esophageal varices   . Metabolic encephalopathy   . Hepatic encephalopathy   . Urinary urgency   . H/O renal failure   . H/O ascites   . Altered mental status   . Anemia   . Ascites   . Shortness of breath   . Peripheral vascular disease   . GERD (gastroesophageal reflux disease)   . Thrombocytopenia, acquired 06/02/2012  . Unspecified hypothyroidism 06/02/2012  . Pain in joint, pelvic region and thigh   . Edema   . Obesity, unspecified   . Chest pain, unspecified   . Pneumonitis due to inhalation of food or vomitus   . Other convulsions   . Cellulitis and abscess of upper arm and forearm   . Chronic hepatitis C without mention of hepatic coma   . Hypopotassemia   . Anemia, unspecified   . Other and unspecified coagulation defects   . Thrombocytopenia, unspecified   . Bipolar I disorder, most recent episode (or current) unspecified   . Unspecified psychosis   . Encephalopathy   . Esophageal varices without mention of bleeding   . Esophagitis, unspecified   . Abscess of liver(572.0)   . Chronic kidney disease, unspecified   . Lumbago   . Personal history of alcoholism   . Personal history of tobacco use, presenting hazards to health   . Cirrhosis of liver without mention of alcohol   . Pneumonitis due to  inhalation of food or vomitus 09/19/2010  . Chronic hepatitis C without mention of hepatic coma   . Bipolar I disorder, most recent episode (or current) unspecified   . Unspecified psychosis   . Other ascites   . Personal history of tobacco use, presenting hazards to health   . Cirrhosis of liver without mention of alcohol   . Type 2 diabetes mellitus with diabetic neuropathy    Past Surgical History  Procedure Laterality Date  . Colostomy    . Cholecystectomy    . Small intestine surgery    . Arm surgery      left arm  . US guided drain placement in ventral hernia abscess  07/21/2010  . Multiple picc line placements    . Esophagogastroduodenoscopy  06/28/2011    Procedure: ESOPHAGOGASTRODUODENOSCOPY (EGD);  Surgeon: Louis Meckel, MD;  Location: [facility];  Service: Endoscopy;  Laterality: N/A;  . Esophagogastroduodenoscopy N/A 05/06/2012    Procedure: ESOPHAGOGASTRODUODENOSCOPY (EGD);  Surgeon: Louis Meckel, MD;  Location: [facility];  Service: Endoscopy;  Laterality: N/A;  . Esophageal banding N/A 05/06/2012    Procedure: ESOPHAGEAL BANDING;  Surgeon: Louis Meckel, MD;  Location: [facility];  Service: Endoscopy;  Laterality: N/A;   Family History  Problem Relation Age of Onset  . Heart disease Mother     MI  . Lung cancer Brother    History  Substance Use Topics  . Smoking status: Former Smoker    Types: Cigarettes    Quit date: 02/15/2008  . Smokeless tobacco: Never Used  . Alcohol Use: No    Review of Systems  Allergies  Ativan; Droperidol; Ketorolac; Penicillins; and Toradol  Home Medications   Current Outpatient Rx  Name  Route  Sig  Dispense  Refill  . albuterol (PROVENTIL HFA;VENTOLIN HFA) 108 (90 BASE) MCG/ACT inhaler   Inhalation   Inhale 2 puffs into the lungs 2 (two) times daily as needed for wheezing.          . ferrous sulfate 325 (65 FE) MG tablet   Oral   Take 325 mg by mouth at bedtime.         . folic acid (FOLVITE) 1 MG  tablet   Oral   Take 1 mg by mouth daily.         . furosemide (LASIX) 80 MG tablet   Oral   Take 80 mg by mouth 2 (two) times daily.         . insulin aspart (NOVOLOG) 100 UNIT/ML injection   Subcutaneous   Inject into the skin See admin instructions. 0-149 = 0 units; 150+ = 5 units 15 minutes before or 30 minutes after a meal.  Subcutaneously before meals for dm.         . insulin glargine (LANTUS) 100 units/mL SOLN   Subcutaneous   Inject 60 Units into the skin daily at 10 pm.   10 mL   3   . lactulose (CHRONULAC) 10 GM/15ML solution   Oral   Take 30 g by mouth 5 (five) times daily.         [name] levETIRAcetam (KEPPRA) 500 MG tablet   Oral   Take 500 mg by mouth 3 (three) times daily.         [name] levothyroxine (SYNTHROID, LEVOTHROID) 75 MCG tablet   Oral   Take 75 mcg by mouth daily.         . Multiple Vitamin (MULTIVITAMIN WITH MINERALS) TABS   Oral   Take 1 tablet by mouth daily.         [name] omeprazole (PRILOSEC) 20 MG capsule   Oral   Take 20 mg by mouth daily.         . Oxycodone HCl 10 MG TABS   Oral   Take 10 mg by mouth every 4 (four) hours as needed (pain).          . potassium chloride (K-DUR) 10 MEQ tablet   Oral   Take 10 mEq by mouth 2 (two) times daily.         . rifaximin (XIFAXAN) 550 MG TABS   Oral   Take 550 mg by mouth 2 (two) times daily.         . risperiDONE (RISPERDAL) 0.25 MG tablet   Oral   Take 0.25 mg by mouth 2 (two) times daily.         [name] spironolactone (ALDACTONE) 100 MG tablet   Oral   Take 200 mg by mouth daily.         [name] tuberculin (APLISOL) 5 UNIT/0.1ML injection   Intradermal   Inject 0.1 mLs into the skin every evening. Every Tuesday, Thursday for ppd for 3  days.         . glucose blood (ACCU-CHEK ACTIVE STRIPS) test strip      Use as instructed 3 times daily to help control diabetes. 250.00   100 each   12   . glucose monitoring kit (FREESTYLE) monitoring kit   Does not apply   1 each by  Does not apply route as needed for other. Check sugar 4 times daily, once before each meal and at bedtime   1 each   0   . glucose monitoring kit (FREESTYLE) monitoring kit   Does not apply   1 each by Does not apply route as needed for other.   1 each   0   . Lancets (ACCU-CHEK MULTICLIX) lancets      Use as instructed   100 each   3   . Lancets (ONETOUCH ULTRASOFT) lancets      Use as instructed   100 each   12    BP 138/70  Pulse 98  Temp(Src) 97.3 F (36.3 C) (Axillary)  Resp 12  SpO2 98% Physical Exam  ED Course  Procedures (including critical care time) Labs Reviewed  COMPREHENSIVE METABOLIC PANEL - Abnormal; Notable for the following:    Sodium 119 (*)    Chloride 85 (*)    Glucose, Bld 174 (*)    Calcium 8.3 (*)    Albumin 2.2 (*)    AST 112 (*)    Alkaline Phosphatase 248 (*)    Total Bilirubin 3.4 (*)    All other components within normal limits  CBC WITH DIFFERENTIAL - Abnormal; Notable for the following:    RBC 3.15 (*)    Hemoglobin 11.6 (*)    HCT 31.4 (*)    MCH 36.8 (*)    MCHC 36.9 (*)    Platelets 53 (*)    Neutrophils Relative % 88 (*)    Lymphocytes Relative 8 (*)    Lymphs Abs 0.5 (*)    All other components within normal limits  PROTIME-INR - Abnormal; Notable for the following:    Prothrombin Time 18.0 (*)    INR 1.53 (*)    All other components within normal limits  AMMONIA - Abnormal; Notable for the following:    Ammonia 178 (*)    All other components within normal limits  URINALYSIS, ROUTINE W REFLEX MICROSCOPIC - Abnormal; Notable for the following:    Color, Urine AMBER (*)    APPearance CLOUDY (*)    Leukocytes, UA LARGE (*)    All other components within normal limits  URINE MICROSCOPIC-ADD ON - Abnormal; Notable for the following:    Bacteria, UA MANY (*)    All other components within normal limits  CULTURE, BLOOD (ROUTINE X 2)  URINE CULTURE   Ct Head Wo Contrast  08/21/2012   *RADIOLOGY REPORT*  Clinical  Data: Altered mental status  CT HEAD WITHOUT CONTRAST  Technique:  Contiguous axial images were obtained from the base of the skull through the vertex without contrast.  Comparison: Prior CT from 07/25/12 as well as earlier studies.  Findings: There is no acute intracranial hemorrhage or infarct.  No mass lesion or midline shift.  There is no extra-axial fluid collection.  Mild generalized atrophy is unchanged.  The calvarium is intact.  Small left mastoid effusion is again noted, grossly stable. The paranasal sinuses are clear.  IMPRESSION:  1.  Stable mild atrophy.  2. No acute intracranial process.   Original Report Authenticated By: Sharlet Salina  Phill Myron, M.D.   Dg Chest Portable 1 View  08/21/2012   *RADIOLOGY REPORT*  Clinical Data: Altered mental status, history of hepatitis C and renal failure  PORTABLE CHEST - 1 VIEW  Comparison: Chest x-ray of 04/16/2012  Findings: The lungs are not well aerated.  There is mild cardiomegaly present and there may be mild pulmonary vascular congestion as well.  No acute bony abnormality is seen.  IMPRESSION: Poor inspiration.  Cardiomegaly.  Question mild pulmonary vascular congestion.   Original Report Authenticated By: Dwyane Dee, M.D.   No diagnosis found.  MDM  2:43 PM ultrasound-guided IV: Nurses unable establish adequate IV access. 20-gauge IV placed in the left basilic vein under ultrasound guidance with return of dark red, nonpulsatile blood. IV flushed and drew back well. Patient tolerated procedure well with no complications.  Caren Hazy, MD 08/21/12 1444

## 2012-08-21 NOTE — Consult Note (Signed)
Referring Provider: [facility] Primary Care Physician:  [name], DO Primary Gastroenterologist:  Dewaine Conger  Reason for Consultation: Bright red blood in NG tube  HPI: Tony Boyd is a 57 y.o. male who is brought to the emergency room today with progressive confusion and altered mental status. 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 He has had recent admission with hepatic encephalopathy. He also has history of seizure disorder, Korsakoff  psychosis, bipolar disorder, ongoing alcohol abuse. He is known to Dr. Arlyce Dice and has undergone prior upper endoscopies. His last EGD was done in March of 2014 and showed grade 3 esophageal varices in the lower one third of the esophagus. He has not had any prior GI bleeding and no prior variceal banding. He is also noted to have portal hypertensive gastropathy and is supposed to be on nadolol at home. After presenting to the emergency room today with altered mental status felt secondary to hepatic encephalopathy an NG tube was placed to him still Chronulac and had return of a small amount of bright red blood. Over the past 2 hours he has not manifested any evidence of ongoing bleeding and has in total about 50 cc of clear thin red blood in the NG tube being in canister. Patient is now alert and denies any vomiting at home prior to his ER visit. He denies any melena or hematochezia or abdominal pain. He tells me his last drink was 3 days ago. It is unclear whether he's been taking any of his medications or not. Review of his labs shows astable hemoglobin in the 11 range, severe thrombocytopenia, normal BUN and a venous ammonia of 167.   Past Medical History  Diagnosis Date  . Cirrhosis of liver   . Hepatitis C   . ETOH abuse   . Hypothyroid   . Psychosis   . Bipolar disorder   . Seizures   . Arthritis   . Back pain   . Coagulopathy     Hx of  . Thrombocytopenia      Hx of  . Pancytopenia     Hx of  . H/O hypokalemia   . Hyponatremia     Hx of  . Korsakoff psychosis   . H/O abdominal abscess   . H/O esophageal varices   . Metabolic encephalopathy   . Hepatic encephalopathy   . Urinary urgency   . H/O renal failure   . H/O ascites   . Altered mental status   . Anemia   . Ascites   . Shortness of breath   . Peripheral vascular disease   . GERD (gastroesophageal reflux disease)   . Thrombocytopenia, acquired 06/02/2012  . Unspecified hypothyroidism 06/02/2012  . Pain in joint, pelvic region and thigh   . Edema   . Obesity, unspecified   . Chest pain, unspecified   . Pneumonitis due to inhalation of food or vomitus   . Other convulsions   . Cellulitis and abscess of upper arm and forearm   . Chronic hepatitis C without mention of hepatic coma   . Hypopotassemia   . Anemia, unspecified   . Other and unspecified coagulation defects   . Thrombocytopenia, unspecified   . Bipolar I disorder, most recent episode (or current) unspecified   . Unspecified psychosis   . Encephalopathy   . Esophageal varices without mention of bleeding   . Esophagitis, unspecified   . Abscess of liver(572.0)   . Chronic  kidney disease, unspecified   . Lumbago   . Personal history of alcoholism   . Personal history of tobacco use, presenting hazards to health   . Cirrhosis of liver without mention of alcohol   . Pneumonitis due to inhalation of food or vomitus 09/19/2010  . Chronic hepatitis C without mention of hepatic coma   . Bipolar I disorder, most recent episode (or current) unspecified   . Unspecified psychosis   . Other ascites   . Personal history of tobacco use, presenting hazards to health   . Cirrhosis of liver without mention of alcohol   . Type 2 diabetes mellitus with diabetic neuropathy     Past Surgical History  Procedure Laterality Date  . Colostomy    . Cholecystectomy    . Small intestine surgery    . Arm surgery      left arm  .  US guided drain placement in ventral hernia abscess  07/21/2010  . Multiple picc line placements    . Esophagogastroduodenoscopy  06/28/2011    Procedure: ESOPHAGOGASTRODUODENOSCOPY (EGD);  Surgeon: Louis Meckel, MD;  Location: [facility];  Service: Endoscopy;  Laterality: N/A;  . Esophagogastroduodenoscopy N/A 05/06/2012    Procedure: ESOPHAGOGASTRODUODENOSCOPY (EGD);  Surgeon: Louis Meckel, MD;  Location: [facility];  Service: Endoscopy;  Laterality: N/A;  . Esophageal banding N/A 05/06/2012    Procedure: ESOPHAGEAL BANDING;  Surgeon: Louis Meckel, MD;  Location: [facility];  Service: Endoscopy;  Laterality: N/A;    Prior to Admission medications   Medication Sig Start Date End Date Taking? Authorizing Provider  albuterol (PROVENTIL HFA;VENTOLIN HFA) 108 (90 BASE) MCG/ACT inhaler Inhale 2 puffs into the lungs 2 (two) times daily as needed for wheezing.    Yes Historical Provider, MD  ferrous sulfate 325 (65 FE) MG tablet Take 325 mg by mouth at bedtime.   Yes Historical Provider, MD  folic acid (FOLVITE) 1 MG tablet Take 1 mg by mouth daily.   Yes Historical Provider, MD  furosemide (LASIX) 80 MG tablet Take 80 mg by mouth 2 (two) times daily.   Yes Historical Provider, MD  insulin aspart (NOVOLOG) 100 UNIT/ML injection Inject into the skin See admin instructions. 0-149 = 0 units; 150+ = 5 units 15 minutes before or 30 minutes after a meal.  Subcutaneously before meals for dm.   Yes Historical Provider, MD  insulin glargine (LANTUS) 100 units/mL SOLN Inject 60 Units into the skin daily at 10 pm. 08/13/12  Yes Nishant Dhungel, MD  lactulose (CHRONULAC) 10 GM/15ML solution Take 30 g by mouth 5 (five) times daily.   Yes Historical Provider, MD  levETIRAcetam (KEPPRA) 500 MG tablet Take 500 mg by mouth 3 (three) times daily.   Yes Historical Provider, MD  levothyroxine (SYNTHROID, LEVOTHROID) 75 MCG tablet Take 75 mcg by mouth daily.   Yes Historical Provider, MD  Multiple Vitamin  (MULTIVITAMIN WITH MINERALS) TABS Take 1 tablet by mouth daily.   Yes Historical Provider, MD  omeprazole (PRILOSEC) 20 MG capsule Take 20 mg by mouth daily.   Yes Historical Provider, MD  Oxycodone HCl 10 MG TABS Take 10 mg by mouth every 4 (four) hours as needed (pain).    Yes Historical Provider, MD  potassium chloride (K-DUR) 10 MEQ tablet Take 10 mEq by mouth 2 (two) times daily.   Yes Historical Provider, MD  rifaximin (XIFAXAN) 550 MG TABS Take 550 mg by mouth 2 (two) times daily. 01/26/12  Yes Alison Murray, MD  risperiDONE (  RISPERDAL) 0.25 MG tablet Take 0.25 mg by mouth 2 (two) times daily.   Yes Historical Provider, MD  spironolactone (ALDACTONE) 100 MG tablet Take 200 mg by mouth daily.   Yes Historical Provider, MD  tuberculin (APLISOL) 5 UNIT/0.1ML injection Inject 0.1 mLs into the skin every evening. Every Tuesday, Thursday for ppd for 3 days.   Yes Historical Provider, MD  glucose blood (ACCU-CHEK ACTIVE STRIPS) test strip Use as instructed 3 times daily to help control diabetes. 250.00 08/14/12   Tiffany L Reed, DO  glucose monitoring kit (FREESTYLE) monitoring kit 1 each by Does not apply route as needed for other. Check sugar 4 times daily, once before each meal and at bedtime 08/11/12   Catarina Hartshorn, MD  glucose monitoring kit (FREESTYLE) monitoring kit 1 each by Does not apply route as needed for other. 08/13/12   Nishant Dhungel, MD  Lancets (ACCU-CHEK MULTICLIX) lancets Use as instructed 08/13/12   Theda Belfast Dhungel, MD  Lancets (ONETOUCH ULTRASOFT) lancets Use as instructed 08/11/12   Catarina Hartshorn, MD    Current Facility-Administered Medications  Medication Dose Route Frequency Provider Last Rate Last Dose  . insulin aspart (novoLOG) injection 0-15 Units  0-15 Units Subcutaneous Q4H Marianne L York, PA-C      . lactulose (CHRONULAC) 10 GM/15ML solution 20 g  20 g Oral QID Stephani Police, PA-C      . lactulose (CHRONULAC) enema 300 mL  300 mL Rectal QID Stephani Police, PA-C      .  levETIRAcetam (KEPPRA) 500 mg in sodium chloride 0.9 % 100 mL IVPB  500 mg Intravenous TID Stephani Police, PA-C 420 mL/hr at 08/21/12 1622 500 mg at 08/21/12 1622  . [DISCONTINUED] ciprofloxacin (CIPRO) IVPB 400 mg  400 mg Intravenous Once Raeford Razor, MD 200 mL/hr at 08/21/12 1643 400 mg at 08/21/12 1643   Current Outpatient Prescriptions  Medication Sig Dispense Refill  . albuterol (PROVENTIL HFA;VENTOLIN HFA) 108 (90 BASE) MCG/ACT inhaler Inhale 2 puffs into the lungs 2 (two) times daily as needed for wheezing.       . ferrous sulfate 325 (65 FE) MG tablet Take 325 mg by mouth at bedtime.      . folic acid (FOLVITE) 1 MG tablet Take 1 mg by mouth daily.      . furosemide (LASIX) 80 MG tablet Take 80 mg by mouth 2 (two) times daily.      . insulin aspart (NOVOLOG) 100 UNIT/ML injection Inject into the skin See admin instructions. 0-149 = 0 units; 150+ = 5 units 15 minutes before or 30 minutes after a meal.  Subcutaneously before meals for dm.      . insulin glargine (LANTUS) 100 units/mL SOLN Inject 60 Units into the skin daily at 10 pm.  10 mL  3  . lactulose (CHRONULAC) 10 GM/15ML solution Take 30 g by mouth 5 (five) times daily.      [name] levETIRAcetam (KEPPRA) 500 MG tablet Take 500 mg by mouth 3 (three) times daily.      [name] levothyroxine (SYNTHROID, LEVOTHROID) 75 MCG tablet Take 75 mcg by mouth daily.      . Multiple Vitamin (MULTIVITAMIN WITH MINERALS) TABS Take 1 tablet by mouth daily.      [name] omeprazole (PRILOSEC) 20 MG capsule Take 20 mg by mouth daily.      . Oxycodone HCl 10 MG TABS Take 10 mg by mouth every 4 (four) hours as needed (pain).       [name]  potassium chloride (K-DUR) 10 MEQ tablet Take 10 mEq by mouth 2 (two) times daily.      . rifaximin (XIFAXAN) 550 MG TABS Take 550 mg by mouth 2 (two) times daily.      . risperiDONE (RISPERDAL) 0.25 MG tablet Take 0.25 mg by mouth 2 (two) times daily.      [name] spironolactone (ALDACTONE) 100 MG tablet Take 200 mg by mouth daily.      [name]  tuberculin (APLISOL) 5 UNIT/0.1ML injection Inject 0.1 mLs into the skin every evening. Every Tuesday, Thursday for ppd for 3 days.      [name] glucose blood (ACCU-CHEK ACTIVE STRIPS) test strip Use as instructed 3 times daily to help control diabetes. 250.00  100 each  12  . glucose monitoring kit (FREESTYLE) monitoring kit 1 each by Does not apply route as needed for other. Check sugar 4 times daily, once before each meal and at bedtime  1 each  0  . glucose monitoring kit (FREESTYLE) monitoring kit 1 each by Does not apply route as needed for other.  1 each  0  . Lancets (ACCU-CHEK MULTICLIX) lancets Use as instructed  100 each  3  . Lancets (ONETOUCH ULTRASOFT) lancets Use as instructed  100 each  12    Allergies as of 08/21/2012 - Review Complete 08/21/2012  Allergen Reaction Noted  . Ativan (lorazepam) Other (See Comments) 08/08/2012  . Droperidol Other (See Comments) 09/14/2010  . Ketorolac Other (See Comments) 09/14/2010  . Penicillins Swelling and Other (See Comments) 09/14/2010  . Toradol (ketorolac tromethamine) Hives 06/06/2012    Family History  Problem Relation Age of Onset  . Heart disease Mother     MI  . Lung cancer Brother     History   Social History  . Marital Status: Single    Spouse Name: N/A    Number of Children: 2  . Years of Education: N/A   Occupational History  . disabled    Social History Main Topics  . Smoking status: Former Smoker    Types: Cigarettes    Quit date: 02/15/2008  . Smokeless tobacco: Never Used  . Alcohol Use: No  . Drug Use: No  . Sexually Active: No   Other Topics Concern  . Not on file   Social History Narrative   Resident of ALF.  States he is widowed.  Ambulates with a walker.    Review of Systems: Unable to answer-  Physical Exam: Vital signs in last 24 hours: Temp:  [97.3 F (36.3 C)-99 F (37.2 C)] 99 F (37.2 C) (06/26 1614) Pulse Rate:  [86-98] 92 (06/26 1614) Resp:  [12-22] 14 (06/26 1614) BP:  (133-140)/(67-72) 133/67 mmHg (06/26 1614) SpO2:  [98 %] 98 % (06/26 1614)   General:   Alert,  Well-developed, confused chronically ill-appearing disheveled obese white male- able to answer simple questions but not necessarily appropriately. Head:  Normocephalic and atraumatic. Eyes:  Sclera clear, no icterus.   Conjunctiva pink. Ears:  Normal auditory acuity. Nose:  No deformity, discharge,  or lesions. NG tube in with scant amount of thin bright red blood in tubing and canister measuring a total of 40 cc Mouth:  No deformity or lesions.   Neck:  Supple; no masses or thyromegaly. Lungs:  Clear throughout to auscultation.   No wheezes, crackles, or rhonchi. Heart:  Regular rate and rhythm; no murmurs, clicks, rubs,  or gallops. Abdomen: Large protuberant positive fluid wave nontender ascites nontender no palpable mass or hepatosplenomegaly ,  bowel sounds are present  Rectal- not done Msk:  Symmetrical without gross deformities. . Pulses:  Normal pulses noted. Extremities: Chronic stasis changes bilaterally, 2+ edema bilaterally below the knees Neurologic:  Alert x1 , positive asterixis Skin:  Intact without significant lesions or rashes.. Psych:  Alert and cooperative. Able to answer a few questions appropriately and then answers "Thursday" to all of my other questions, says his last drink was 3 days ago  Intake/Output from previous day:   Intake/Output this shift:    Lab Results:  Recent Labs  08/21/12 1229  WBC 5.9  HGB 11.6*  HCT 31.4*  PLT 53*   BMET  Recent Labs  08/21/12 1229  NA 119*  K 4.9  CL 85*  CO2 29  GLUCOSE 174*  BUN 17  CREATININE 0.89  CALCIUM 8.3*   LFT  Recent Labs  08/21/12 1229  PROT 7.2  ALBUMIN 2.2*  AST 112*  ALT 53  ALKPHOS 248*  BILITOT 3.4*   PT/INR  Recent Labs  08/21/12 1229  LABPROT 18.0*  INR 1.53*   Hepatitis Panel No results found for this basename: HEPBSAG, HCVAB, HEPAIGM, HEPBIGM,  in the last 72  hours    Studies/Results: Ct Head Wo Contrast  08/21/2012   *RADIOLOGY REPORT*  Clinical Data: Altered mental status  CT HEAD WITHOUT CONTRAST  Technique:  Contiguous axial images were obtained from the base of the skull through the vertex without contrast.  Comparison: Prior CT from 07/25/12 as well as earlier studies.  Findings: There is no acute intracranial hemorrhage or infarct.  No mass lesion or midline shift.  There is no extra-axial fluid collection.  Mild generalized atrophy is unchanged.  The calvarium is intact.  Small left mastoid effusion is again noted, grossly stable. The paranasal sinuses are clear.  IMPRESSION:  1.  Stable mild atrophy.  2. No acute intracranial process.   Original Report Authenticated By: Rise Mu, M.D.   Dg Chest Portable 1 View  08/21/2012   *RADIOLOGY REPORT*  Clinical Data: Altered mental status, history of hepatitis C and renal failure  PORTABLE CHEST - 1 VIEW  Comparison: Chest x-ray of 04/16/2012  Findings: The lungs are not well aerated.  There is mild cardiomegaly present and there may be mild pulmonary vascular congestion as well.  No acute bony abnormality is seen.  IMPRESSION: Poor inspiration.  Cardiomegaly.  Question mild pulmonary vascular congestion.   Original Report Authenticated By: Dwyane Dee, M.D.    IMPRESSION:  #43 57 year old white male alcoholic with decompensated cirrhosis secondary to EtOH and hepatitis C with portal hypertension, previously documented esophageal varices and portal gastropathy, chronic thrombocytopenia and coagulopathy as well as ascites and hepatic encephalopathy. Patient presents to the emergency room now with hepatic encephalopathy, #2 bright red blood per NG tube-without any evidence for acute GI bleed. The small amount of blood per NG is very likely secondary to NG tube trauma of the pharynx esophagus or stomach and not secondary to any underlying acute bleed  #3 seizure disorder #4 ongoing alcohol  abuse #5 diabetes mellitus #6 history of bipolar disorder and psychosis  #7 hyponatremia-review of records shows that he has had problems with hyponatremia and left the hospital with a sodium of 127 last week    Plan; #1 serial hemoglobins #2 empiric PPI #3 would give a large dose of Chronulac now per the NG tube then clamp and remove the NG tube in an hour so that it does not cause any ongoing irritation.  He should be alert and  at that point to take by mouth's and if not could consider Chronulac enemas #4 clear liquid diet when alert  We will followup in a.m. but do not anticipate need for endoscopy at this time  Mike Gip  08/21/2012, 5:34 PM   GI ATTENDING  History,labs,x-rays, prior EGD reports reviewed. Agree with H&P as outlined above. Complicated patient with advanced liver disease, portal HTN, hepatic encephalopathy, and psychosocial issues. Admitted with hepatic encephalopathy. NG tube trauma with associated minor bleeding. Agree with removing NGT after lactulose, as patient is waking.  Wilhemina Bonito. Eda Keys., M.D. [facility] of Gastroenterology

## 2012-08-21 NOTE — ED Notes (Signed)
Pt confused to place and time-- does answer appropriately to day. Will not follow with eyes, will not follow commands. Admitting PA notified of inability to swallow or to retain enema ordered

## 2012-08-21 NOTE — Progress Notes (Signed)
Received patient from ED to room 2610, alert, NG tube to left nare, clamped. Foley in place. Patient secure in bed, rails up x2, bed low and locked.

## 2012-08-21 NOTE — H&P (Signed)
Addendum  Patient seen and examined, chart and data base reviewed.  I agree with the above assessment and plan.  For full details please see Mrs. Algis Downs PA note.  Hepatic encephalopathy, started on Lactulose.  NGT placed, pt started to have bright red blood draining, per RN was not traumatic placement. GI called.   Clint Lipps, MD [facility] Pager: [phone] 08/21/2012, 4:47 PM

## 2012-08-21 NOTE — ED Notes (Signed)
Pt's CBG was 156 when I checked it.4:16pm JG.

## 2012-08-21 NOTE — ED Notes (Signed)
Per ems pt from [facility].  Last seen normal and able to use walker to ambulate, 7pm last night.  Pt found this morning not responding to verbal or physical stimuli.  EMS states only flinched when pressure applies to nail bed.  Pt keeps repeating "Im not good".  Pt hx of bipolar psychosis.  SR on monitior,Pupils fix , equal bilaterally.  114/54, 23 resp, 134 cbg.  No voluntary movement reported by ems

## 2012-08-21 NOTE — ED Notes (Signed)
MD at bedside attempting IV access.

## 2012-08-21 NOTE — ED Provider Notes (Addendum)
History     57 year old male transferred from [facility] for evaluation of altered mental status. Patient apparently is able to ambulate with the use of a walker and carry on a conversation? He was reportedly in his usual state of health around 7:00 last night. This morning found very confused. No trauma reported. No report of fever. No vomiting or diarrhea. Patient with multiple medical problems including cirrhosis and recent admission for hepatic encephalopathy. Patient is currently very confused and unable to provide any useful history.   CSN: [id number] Arrival date & time 08/21/12  1031  First MD Initiated Contact with Patient 08/21/12 1048     Chief Complaint  Patient presents with  . Altered Mental Status   (Consider location/radiation/quality/duration/timing/severity/associated sxs/prior Treatment) HPI Past Medical History  Diagnosis Date  . Cirrhosis of liver   . Hepatitis C   . ETOH abuse   . Hypothyroid   . Psychosis   . Bipolar disorder   . Seizures   . Arthritis   . Back pain   . Coagulopathy     Hx of  . Thrombocytopenia     Hx of  . Pancytopenia     Hx of  . H/O hypokalemia   . Hyponatremia     Hx of  . Korsakoff psychosis   . H/O abdominal abscess   . H/O esophageal varices   . Metabolic encephalopathy   . Hepatic encephalopathy   . Urinary urgency   . H/O renal failure   . H/O ascites   . Altered mental status   . Anemia   . Ascites   . Shortness of breath   . Peripheral vascular disease   . GERD (gastroesophageal reflux disease)   . Thrombocytopenia, acquired 06/02/2012  . Unspecified hypothyroidism 06/02/2012  . Pain in joint, pelvic region and thigh   . Edema   . Obesity, unspecified   . Chest pain, unspecified   . Pneumonitis due to inhalation of food or vomitus   . Other convulsions   . Cellulitis and abscess of upper arm and forearm   . Chronic hepatitis C without mention of hepatic coma   . Hypopotassemia   . Anemia,  unspecified   . Other and unspecified coagulation defects   . Thrombocytopenia, unspecified   . Bipolar I disorder, most recent episode (or current) unspecified   . Unspecified psychosis   . Encephalopathy   . Esophageal varices without mention of bleeding   . Esophagitis, unspecified   . Abscess of liver(572.0)   . Chronic kidney disease, unspecified   . Lumbago   . Personal history of alcoholism   . Personal history of tobacco use, presenting hazards to health   . Cirrhosis of liver without mention of alcohol   . Pneumonitis due to inhalation of food or vomitus 09/19/2010  . Chronic hepatitis C without mention of hepatic coma   . Bipolar I disorder, most recent episode (or current) unspecified   . Unspecified psychosis   . Other ascites   . Personal history of tobacco use, presenting hazards to health   . Cirrhosis of liver without mention of alcohol   . Type 2 diabetes mellitus with diabetic neuropathy    Past Surgical History  Procedure Laterality Date  . Colostomy    . Cholecystectomy    . Small intestine surgery    . Arm surgery      left arm  . US guided drain placement in ventral hernia abscess  07/21/2010  .  Multiple picc line placements    . Esophagogastroduodenoscopy  06/28/2011    Procedure: ESOPHAGOGASTRODUODENOSCOPY (EGD);  Surgeon: Louis Meckel, MD;  Location: [facility];  Service: Endoscopy;  Laterality: N/A;  . Esophagogastroduodenoscopy N/A 05/06/2012    Procedure: ESOPHAGOGASTRODUODENOSCOPY (EGD);  Surgeon: Louis Meckel, MD;  Location: [facility];  Service: Endoscopy;  Laterality: N/A;  . Esophageal banding N/A 05/06/2012    Procedure: ESOPHAGEAL BANDING;  Surgeon: Louis Meckel, MD;  Location: [facility];  Service: Endoscopy;  Laterality: N/A;   Family History  Problem Relation Age of Onset  . Heart disease Mother     MI  . Lung cancer Brother    History  Substance Use Topics  . Smoking status: Former Smoker    Types: Cigarettes    Quit  date: 02/15/2008  . Smokeless tobacco: Never Used  . Alcohol Use: No    Review of Systems  Level 5 caveat applies because pt unresponsive to questioning.   Allergies  Ativan; Droperidol; Ketorolac; Penicillins; and Toradol  Home Medications   Current Outpatient Rx  Name  Route  Sig  Dispense  Refill  . albuterol (PROVENTIL HFA;VENTOLIN HFA) 108 (90 BASE) MCG/ACT inhaler   Inhalation   Inhale 2 puffs into the lungs 2 (two) times daily as needed for wheezing.          . ferrous sulfate 325 (65 FE) MG tablet   Oral   Take 325 mg by mouth at bedtime.         . folic acid (FOLVITE) 1 MG tablet   Oral   Take 1 mg by mouth daily.         . furosemide (LASIX) 80 MG tablet   Oral   Take 80 mg by mouth 2 (two) times daily.         . insulin aspart (NOVOLOG) 100 UNIT/ML injection   Subcutaneous   Inject into the skin See admin instructions. 0-149 = 0 units; 150+ = 5 units 15 minutes before or 30 minutes after a meal.  Subcutaneously before meals for dm.         . insulin glargine (LANTUS) 100 units/mL SOLN   Subcutaneous   Inject 60 Units into the skin daily at 10 pm.   10 mL   3   . lactulose (CHRONULAC) 10 GM/15ML solution   Oral   Take 30 g by mouth 5 (five) times daily.         [name] levETIRAcetam (KEPPRA) 500 MG tablet   Oral   Take 500 mg by mouth 3 (three) times daily.         [name] levothyroxine (SYNTHROID, LEVOTHROID) 75 MCG tablet   Oral   Take 75 mcg by mouth daily.         . Multiple Vitamin (MULTIVITAMIN WITH MINERALS) TABS   Oral   Take 1 tablet by mouth daily.         [name] omeprazole (PRILOSEC) 20 MG capsule   Oral   Take 20 mg by mouth daily.         . Oxycodone HCl 10 MG TABS   Oral   Take 10 mg by mouth every 4 (four) hours as needed (pain).          . potassium chloride (K-DUR) 10 MEQ tablet   Oral   Take 10 mEq by mouth 2 (two) times daily.         . rifaximin (XIFAXAN) 550 MG TABS   Oral  Take 550 mg by mouth 2 (two)  times daily.         . risperiDONE (RISPERDAL) 0.25 MG tablet   Oral   Take 0.25 mg by mouth 2 (two) times daily.         [name] spironolactone (ALDACTONE) 100 MG tablet   Oral   Take 200 mg by mouth daily.         [name] tuberculin (APLISOL) 5 UNIT/0.1ML injection   Intradermal   Inject 0.1 mLs into the skin every evening. Every Tuesday, Thursday for ppd for 3 days.         [name] glucose blood (ACCU-CHEK ACTIVE STRIPS) test strip      Use as instructed 3 times daily to help control diabetes. 250.00   100 each   12   . glucose monitoring kit (FREESTYLE) monitoring kit   Does not apply   1 each by Does not apply route as needed for other. Check sugar 4 times daily, once before each meal and at bedtime   1 each   0   . glucose monitoring kit (FREESTYLE) monitoring kit   Does not apply   1 each by Does not apply route as needed for other.   1 each   0   . Lancets (ACCU-CHEK MULTICLIX) lancets      Use as instructed   100 each   3   . Lancets (ONETOUCH ULTRASOFT) lancets      Use as instructed   100 each   12    BP 138/70  Pulse 98  Temp(Src) 97.3 F (36.3 C) (Axillary)  Resp 12  SpO2 98% Physical Exam  Nursing note and vitals reviewed. Constitutional: He appears well-developed.  obese  HENT:  Head: Normocephalic and atraumatic.  Eyes: Conjunctivae are normal. Pupils are equal, round, and reactive to light. Right eye exhibits no discharge. Left eye exhibits no discharge.  Neck: Neck supple.  Cardiovascular: Normal rate, regular rhythm and normal heart sounds.  Exam reveals no gallop and no friction rub.   No murmur heard. Pulmonary/Chest: Effort normal and breath sounds normal. No respiratory distress.  Abdominal: Soft. He exhibits distension. There is no tenderness.  Abdomen distended, but soft. Doesn't seem to be tender.  Musculoskeletal: He exhibits no edema and no tenderness.  Neurological:  Laying in bed with eyes open. Spontaneously moving all  extremities. Will not reliably follow commands. Responds to questioning with either "I'm not well" or "It's ok."   Skin: Skin is warm and dry.  Anasarca. Spider angiomata    ED Course  Apiration of blood/fluid Date/Time: 08/21/2012 1:00 PM Performed by: Raeford Razor Authorized by: Raeford Razor Consent: The procedure was performed in an emergent situation. Patient identity confirmed: arm band and provided demographic data Local anesthesia used: no Patient sedated: no Patient tolerance: Patient tolerated the procedure well with no immediate complications. Comments: Difficulty in obtaining both PIV and labs from pt. Blood drawn from R femoral vein. Dark red blood. Non pulsatile. Bleeding easily controlled with direct pressure after. Pt tolerated well.    (including critical care time) Labs Reviewed  CBC WITH DIFFERENTIAL - Abnormal; Notable for the following:    RBC 3.15 (*)    Hemoglobin 11.6 (*)    HCT 31.4 (*)    MCH 36.8 (*)    MCHC 36.9 (*)    All other components within normal limits  PROTIME-INR - Abnormal; Notable for the following:    Prothrombin Time 18.0 (*)    INR  1.53 (*)    All other components within normal limits  AMMONIA - Abnormal; Notable for the following:    Ammonia 178 (*)    All other components within normal limits  COMPREHENSIVE METABOLIC PANEL  URINALYSIS, ROUTINE W REFLEX MICROSCOPIC   Ct Head Wo Contrast  08/21/2012   *RADIOLOGY REPORT*  Clinical Data: Altered mental status  CT HEAD WITHOUT CONTRAST  Technique:  Contiguous axial images were obtained from the base of the skull through the vertex without contrast.  Comparison: Prior CT from 07/25/12 as well as earlier studies.  Findings: There is no acute intracranial hemorrhage or infarct.  No mass lesion or midline shift.  There is no extra-axial fluid collection.  Mild generalized atrophy is unchanged.  The calvarium is intact.  Small left mastoid effusion is again noted, grossly stable. The paranasal  sinuses are clear.  IMPRESSION:  1.  Stable mild atrophy.  2. No acute intracranial process.   Original Report Authenticated By: Rise Mu, M.D.   Dg Chest Portable 1 View  08/21/2012   *RADIOLOGY REPORT*  Clinical Data: Altered mental status, history of hepatitis C and renal failure  PORTABLE CHEST - 1 VIEW  Comparison: Chest x-ray of 04/16/2012  Findings: The lungs are not well aerated.  There is mild cardiomegaly present and there may be mild pulmonary vascular congestion as well.  No acute bony abnormality is seen.  IMPRESSION: Poor inspiration.  Cardiomegaly.  Question mild pulmonary vascular congestion.   Original Report Authenticated By: Dwyane Dee, M.D.   1. Encephalopathy   2. Hyperammonemia   3. UTI (urinary tract infection)     MDM  57yM with altered mental status. Suspect hepatic encephalopathy. Ammonia elevated. UA with evidence of UTI which may be contributing. Dose cipro with PCN allergy. Hyponatremic. Likely related to cirrhosis. Chronic and doubt explanation for current symptoms. Other lab abnormalities (elevated INR, thrombocytopenia, etc) in keeping with hx of cirrhosis as well. Hx of seizure, but would be very prolonged post-ictal state. Difficult to obtain good neuro exam, but doesn't seem to have any motor deficits the best I can tell. CT head w/o acute findings. Admit.    I saw and evaluated the patient, reviewed the resident's note and I agree with the findings and plan. PIV insertion by Dr Fayrene Fearing under my supervision.    Raeford Razor, MD 08/21/12 1622

## 2012-08-21 NOTE — Progress Notes (Signed)
Patient ID: Tony Boyd, male   DOB: [birth year], 57 y.o.   MRN: [id number]    PCP: [name], DO  Code Status: full code  Allergies  Allergen Reactions  . Ativan (Lorazepam) Other (See Comments)    "hallucinations"  . Droperidol Other (See Comments)    unknown  . Ketorolac Other (See Comments)    unknown  . Penicillins Swelling and Other (See Comments)    unkown  . Toradol (Ketorolac Tromethamine) Hives    Chief Complaint: new admit to the facility  HPI:  57 y/o male patient is here for medical management and STR from home after recent hospitalization for hepatic encephalopathy. He has history of hep c, etoh use, uncontrolled DM, seizure, esophageal varices among others. He came to the facility yesterday and had been moving around with his walker and communicating well. This am, he complained of weakness in all his extremities and was having difficulty with movement as per staff. He was able to move his extremities with difficulty and took longer time. Following this, staff noticed that he was not able to move his extremities at all and was unable to come out of bed. He was also having difficulty communicating and then was non responsive and staring at the ceiling. He appeared confused. I see him in the room with his nurse and nursing supervisor present. He is unable to provide any history. Unable to obtain any ROS from him.  Review of Systems:  Unable to obtain  Past Medical History  Diagnosis Date  . Cirrhosis of liver   . Hepatitis C   . ETOH abuse   . Hypothyroid   . Psychosis   . Bipolar disorder   . Seizures   . Arthritis   . Back pain   . Coagulopathy     Hx of  . Thrombocytopenia     Hx of  . Pancytopenia     Hx of  . H/O hypokalemia   . Hyponatremia     Hx of  . Korsakoff psychosis   . H/O abdominal abscess   . H/O esophageal varices   . Metabolic encephalopathy   . Hepatic encephalopathy   . Urinary urgency   . H/O renal failure   . H/O ascites    . Altered mental status   . Anemia   . Ascites   . Shortness of breath   . Peripheral vascular disease   . GERD (gastroesophageal reflux disease)   . Thrombocytopenia, acquired 06/02/2012  . Unspecified hypothyroidism 06/02/2012  . Pain in joint, pelvic region and thigh   . Edema   . Obesity, unspecified   . Chest pain, unspecified   . Pneumonitis due to inhalation of food or vomitus   . Other convulsions   . Cellulitis and abscess of upper arm and forearm   . Chronic hepatitis C without mention of hepatic coma   . Hypopotassemia   . Anemia, unspecified   . Other and unspecified coagulation defects   . Thrombocytopenia, unspecified   . Bipolar I disorder, most recent episode (or current) unspecified   . Unspecified psychosis   . Encephalopathy   . Esophageal varices without mention of bleeding   . Esophagitis, unspecified   . Abscess of liver(572.0)   . Chronic kidney disease, unspecified   . Lumbago   . Personal history of alcoholism   . Personal history of tobacco use, presenting hazards to health   . Cirrhosis of liver without mention of alcohol   . Pneumonitis  due to inhalation of food or vomitus 09/19/2010  . Chronic hepatitis C without mention of hepatic coma   . Bipolar I disorder, most recent episode (or current) unspecified   . Unspecified psychosis   . Other ascites   . Personal history of tobacco use, presenting hazards to health   . Cirrhosis of liver without mention of alcohol   . Type 2 diabetes mellitus with diabetic neuropathy    Past Surgical History  Procedure Laterality Date  . Colostomy    . Cholecystectomy    . Small intestine surgery    . Arm surgery      left arm  . US guided drain placement in ventral hernia abscess  07/21/2010  . Multiple picc line placements    . Esophagogastroduodenoscopy  06/28/2011    Procedure: ESOPHAGOGASTRODUODENOSCOPY (EGD);  Surgeon: Louis Meckel, MD;  Location: [facility];  Service: Endoscopy;  Laterality: N/A;  .  Esophagogastroduodenoscopy N/A 05/06/2012    Procedure: ESOPHAGOGASTRODUODENOSCOPY (EGD);  Surgeon: Louis Meckel, MD;  Location: [facility];  Service: Endoscopy;  Laterality: N/A;  . Esophageal banding N/A 05/06/2012    Procedure: ESOPHAGEAL BANDING;  Surgeon: Louis Meckel, MD;  Location: [facility];  Service: Endoscopy;  Laterality: N/A;   Social History:   reports that he quit smoking about 4 years ago. His smoking use included Cigarettes. He smoked 0.00 packs per day. He has never used smokeless tobacco. He reports that he does not drink alcohol or use illicit drugs.  Family History  Problem Relation Age of Onset  . Heart disease Mother     MI  . Lung cancer Brother     Medications: Patient's Medications  New Prescriptions   No medications on file  Previous Medications   ALBUTEROL (PROVENTIL HFA;VENTOLIN HFA) 108 (90 BASE) MCG/ACT INHALER    Inhale 2 puffs into the lungs 2 (two) times daily as needed for wheezing.    FERROUS SULFATE 325 (65 FE) MG TABLET    Take 325 mg by mouth at bedtime.   FOLIC ACID (FOLVITE) 1 MG TABLET    Take 1 mg by mouth daily.   FUROSEMIDE (LASIX) 80 MG TABLET    Take 80 mg by mouth 2 (two) times daily.   GLUCOSE BLOOD (ACCU-CHEK ACTIVE STRIPS) TEST STRIP    Use as instructed 3 times daily to help control diabetes. 250.00   GLUCOSE MONITORING KIT (FREESTYLE) MONITORING KIT    1 each by Does not apply route as needed for other. Check sugar 4 times daily, once before each meal and at bedtime   GLUCOSE MONITORING KIT (FREESTYLE) MONITORING KIT    1 each by Does not apply route as needed for other.   INSULIN ASPART (NOVOLOG) 100 UNIT/ML INJECTION    Inject 5 Units into the skin 3 (three) times daily with meals.   INSULIN GLARGINE (LANTUS) 100 UNITS/ML SOLN    Inject 60 Units into the skin daily at 10 pm.   LACTULOSE (CHRONULAC) 10 GM/15ML SOLUTION    Take 30 g by mouth 5 (five) times daily.   LANCETS (ACCU-CHEK MULTICLIX) LANCETS    Use as instructed    LANCETS (ONETOUCH ULTRASOFT) LANCETS    Use as instructed   LEVETIRACETAM (KEPPRA) 500 MG TABLET    Take 500 mg by mouth 3 (three) times daily.   LEVOTHYROXINE (SYNTHROID, LEVOTHROID) 75 MCG TABLET    Take 75 mcg by mouth daily.   MULTIPLE VITAMIN (MULTIVITAMIN WITH MINERALS) TABS    Take 1  tablet by mouth daily.   OMEPRAZOLE (PRILOSEC) 20 MG CAPSULE    Take 20 mg by mouth daily.   OXYCODONE HCL 10 MG TABS    Take one tablet every 4 hours as needed for pain   RIFAXIMIN (XIFAXAN) 550 MG TABS    Take 550 mg by mouth 2 (two) times daily.   RISPERIDONE (RISPERDAL) 0.25 MG TABLET    Take 0.25 mg by mouth 2 (two) times daily.   SPIRONOLACTONE (ALDACTONE) 100 MG TABLET    Take 200 mg by mouth daily.  Modified Medications   No medications on file  Discontinued Medications   No medications on file    Physical Exam: Filed Vitals:   08/21/12 0955  BP: 140/72  Pulse: 86  Temp: 98.1 F (36.7 C)  Resp: 22   Constitutional: He has flat affect, staring in the ceiling, not able to answer questions, unable to assess his orientation Obese Caucasian male, mildly jaundiced, has anasarca, chronic venous stasis of LEs  HENT:   Head: Normocephalic and atraumatic.  Nose: Nose normal.   Eyes: Conjunctivae normal. Pupils are dilated and minimally reactive to light, no eye discharge. Not following finger movement test. Scleral icterus present Neck: Neck supple. No JVD present. No tracheal deviation present. Cardiovascular: Normal rate, regular rhythm, normal heart sounds  Pulmonary/Chest: Effort normal and breath sounds normal.  Abdominal: Soft. Bowel sounds are normal. He exhibits distension. He exhibits no mass. There is no tenderness. There is no rebound and no guarding.  Has ascites, not significantly changed from prior visits  Musculoskeletal: unable to move his extremities by himself, strength 3/5, increased muscle tone, no tremors. Able to hold his arms against gravity once lifted up Lymphadenopathy:     He has no cervical adenopathy.  Skin: Skin is warm and dry.   Labs reviewed: Basic Metabolic Panel:  Recent Labs  16/10/96 0356 08/12/12 0714 08/13/12 0555  NA 119* 120* 127*  K 5.1 5.4* 4.1  CL 87* 89* 94*  CO2 26 26 28   GLUCOSE 377* 224* 172*  BUN 12 14 15   CREATININE 0.79 0.76 0.83  CALCIUM 8.5 8.1* 8.2*   Liver Function Tests:  Recent Labs  08/09/12 0520 08/10/12 0543 08/13/12 0555  AST 64* 80* 71*  ALT 41 50 45  ALKPHOS 232* 260* 250*  BILITOT 4.9* 4.4* 3.0*  PROT 6.7 7.0 6.2  ALBUMIN 2.0* 2.1* 1.9*    Recent Labs  08/09/12 0520 08/10/12 0543 08/12/12 0714  AMMONIA 102* 82* 67*   CBC:  Recent Labs  05/23/12 1143  07/25/12 1831  08/08/12 1250 08/09/12 0520 08/13/12 0555  WBC 5.7  < > 8.0  --  4.4 4.1 3.9*  NEUTROABS 4.0  --  6.5  --  3.4  --   --   HGB 12.2*  < > 12.7*  < > 11.9* 11.2* 11.4*  HCT 34.3*  < > 35.9*  < > 32.5* 31.9* 32.1*  MCV 101.5*  < > 101.4*  --  100.6* 102.2* 103.5*  PLT 48*  < > 38*  --  39* 32* 36*  < > = values in this interval not displayed.   Assessment/Plan  Altered mental status- Could be from hepatic encephalopathy. To check ammonia level. Has been on lactulose and rifaxamin. Unsure about his bowel movements recently. He also has history of seizure disorder and is on keppra. He could have had a new seizure episode which could have gone unnoticed following which his mental status is altered and  he is having post ictal state at present. Sending patient to ED for further evaluation of seizure and ruling out other infectious cases. On review of hospital labs, he also has hyponatremia and this could be contributing as well.   New onset weakness- Was able to move around with his walker until yesterday and this am had new weakness in all his extremities and then unable to exhibit any movement. He is able to hold his limbs against gravity and responds to painful stimuli. His altered mental status could be preventing this.  His hepatic encephalopathy and new seizure episode with post ictal state could be contributing to this.  but given his age and DM and acute onset, will need imaging studies to rule out acute brain infarct/ ischemia. Sending patient to ED for stat imaging studies and further workup

## 2012-08-21 NOTE — ED Notes (Signed)
Paged IV team.  RN attempted X2 without success

## 2012-08-21 NOTE — Progress Notes (Signed)
EEG Completed; Results Pending  

## 2012-08-21 NOTE — H&P (Signed)
[facility] History and Physical  Tony Boyd WUJ:[id number] DOB: 1955-06-22 DOA: 08/21/2012  Referring physician: PCP: Tony Spikes, DO  Specialists:   Chief Complaint: Hepatic Encephalopathy  HPI: Tony Boyd is a 57 y.o. male with a history of end-stage liver disease, hepatitis C, esophageal varices, bipolar disorder, alcohol abuse, type 2 diabetes, and hypothyroidism, who was recently discharged from [facility] system on June 19 after an inpatient stay for hepatic encephalopathy.  He returns to the emergency department today from independent living with hepatic encephalopathy, urinary tract infection, hyponatremia, pleural effusions. He is unable to give any coherent history do to his encephalopathy. During my examination in the emergency department he has a mild seizure.  It appears likely that he has not been taking his medications since discharge. No family members are present to provide history.   Review of Systems: He is unable to give a review of systems secondary to hepatic encephalopathy  Past Medical History  Diagnosis Date  . Cirrhosis of liver   . Hepatitis C   . ETOH abuse   . Hypothyroid   . Psychosis   . Bipolar disorder   . Seizures   . Arthritis   . Back pain   . Coagulopathy     Hx of  . Thrombocytopenia     Hx of  . Pancytopenia     Hx of  . H/O hypokalemia   . Hyponatremia     Hx of  . Korsakoff psychosis   . H/O abdominal abscess   . H/O esophageal varices   . Metabolic encephalopathy   . Hepatic encephalopathy   . Urinary urgency   . H/O renal failure   . H/O ascites   . Altered mental status   . Anemia   . Ascites   . Shortness of breath   . Peripheral vascular disease   . GERD (gastroesophageal reflux disease)   . Thrombocytopenia, acquired 06/02/2012  . Unspecified hypothyroidism 06/02/2012  . Pain in joint, pelvic region and thigh   . Edema   . Obesity, unspecified   . Chest pain, unspecified   . Pneumonitis due to inhalation  of food or vomitus   . Other convulsions   . Cellulitis and abscess of upper arm and forearm   . Chronic hepatitis C without mention of hepatic coma   . Hypopotassemia   . Anemia, unspecified   . Other and unspecified coagulation defects   . Thrombocytopenia, unspecified   . Bipolar I disorder, most recent episode (or current) unspecified   . Unspecified psychosis   . Encephalopathy   . Esophageal varices without mention of bleeding   . Esophagitis, unspecified   . Abscess of liver(572.0)   . Chronic kidney disease, unspecified   . Lumbago   . Personal history of alcoholism   . Personal history of tobacco use, presenting hazards to health   . Cirrhosis of liver without mention of alcohol   . Pneumonitis due to inhalation of food or vomitus 09/19/2010  . Chronic hepatitis C without mention of hepatic coma   . Bipolar I disorder, most recent episode (or current) unspecified   . Unspecified psychosis   . Other ascites   . Personal history of tobacco use, presenting hazards to health   . Cirrhosis of liver without mention of alcohol   . Type 2 diabetes mellitus with diabetic neuropathy    Past Surgical History  Procedure Laterality Date  . Colostomy    . Cholecystectomy    . Small  intestine surgery    . Arm surgery      left arm  . US guided drain placement in ventral hernia abscess  07/21/2010  . Multiple picc line placements    . Esophagogastroduodenoscopy  06/28/2011    Procedure: ESOPHAGOGASTRODUODENOSCOPY (EGD);  Surgeon: Tony Meckel, MD;  Location: [facility];  Service: Endoscopy;  Laterality: N/A;  . Esophagogastroduodenoscopy N/A 05/06/2012    Procedure: ESOPHAGOGASTRODUODENOSCOPY (EGD);  Surgeon: Tony Meckel, MD;  Location: [facility];  Service: Endoscopy;  Laterality: N/A;  . Esophageal banding N/A 05/06/2012    Procedure: ESOPHAGEAL BANDING;  Surgeon: Tony Meckel, MD;  Location: [facility];  Service: Endoscopy;  Laterality: N/A;   Social History:   reports that he quit smoking about 4 years ago. His smoking use included Cigarettes. He smoked 0.00 packs per day. He has never used smokeless tobacco. He reports that he does not drink alcohol or use illicit drugs. According to his most recent discharge he continues to drink alcohol approximately twice a week. He is from independent living.  Allergies  Allergen Reactions  . Ativan (Lorazepam) Other (See Comments)    "hallucinations"  . Droperidol Other (See Comments)    unknown  . Ketorolac Other (See Comments)    unknown  . Penicillins Swelling and Other (See Comments)    unkown  . Toradol (Ketorolac Tromethamine) Hives    Family History  Problem Relation Age of Onset  . Heart disease Mother     MI  . Lung cancer Brother     Prior to Admission medications   Medication Sig Start Date End Date Taking? Authorizing Provider  albuterol (PROVENTIL HFA;VENTOLIN HFA) 108 (90 BASE) MCG/ACT inhaler Inhale 2 puffs into the lungs 2 (two) times daily as needed for wheezing.    Yes Historical Provider, MD  ferrous sulfate 325 (65 FE) MG tablet Take 325 mg by mouth at bedtime.   Yes Historical Provider, MD  folic acid (FOLVITE) 1 MG tablet Take 1 mg by mouth daily.   Yes Historical Provider, MD  furosemide (LASIX) 80 MG tablet Take 80 mg by mouth 2 (two) times daily.   Yes Historical Provider, MD  insulin aspart (NOVOLOG) 100 UNIT/ML injection Inject into the skin See admin instructions. 0-149 = 0 units; 150+ = 5 units 15 minutes before or 30 minutes after a meal.  Subcutaneously before meals for dm.   Yes Historical Provider, MD  insulin glargine (LANTUS) 100 units/mL SOLN Inject 60 Units into the skin daily at 10 pm. 08/13/12  Yes Tony Dhungel, MD  lactulose (CHRONULAC) 10 GM/15ML solution Take 30 g by mouth 5 (five) times daily.   Yes Historical Provider, MD  levETIRAcetam (KEPPRA) 500 MG tablet Take 500 mg by mouth 3 (three) times daily.   Yes Historical Provider, MD  levothyroxine  (SYNTHROID, LEVOTHROID) 75 MCG tablet Take 75 mcg by mouth daily.   Yes Historical Provider, MD  Multiple Vitamin (MULTIVITAMIN WITH MINERALS) TABS Take 1 tablet by mouth daily.   Yes Historical Provider, MD  omeprazole (PRILOSEC) 20 MG capsule Take 20 mg by mouth daily.   Yes Historical Provider, MD  Oxycodone HCl 10 MG TABS Take 10 mg by mouth every 4 (four) hours as needed (pain).    Yes Historical Provider, MD  potassium chloride (K-DUR) 10 MEQ tablet Take 10 mEq by mouth 2 (two) times daily.   Yes Historical Provider, MD  rifaximin (XIFAXAN) 550 MG TABS Take 550 mg by mouth 2 (two) times daily. 01/26/12  Yes Alison Murray, MD  risperiDONE (RISPERDAL) 0.25 MG tablet Take 0.25 mg by mouth 2 (two) times daily.   Yes Historical Provider, MD  spironolactone (ALDACTONE) 100 MG tablet Take 200 mg by mouth daily.   Yes Historical Provider, MD  tuberculin (APLISOL) 5 UNIT/0.1ML injection Inject 0.1 mLs into the skin every evening. Every Tuesday, Thursday for ppd for 3 days.   Yes Historical Provider, MD  glucose blood (ACCU-CHEK ACTIVE STRIPS) test strip Use as instructed 3 times daily to help control diabetes. 250.00 08/14/12   Tiffany L Reed, DO  glucose monitoring kit (FREESTYLE) monitoring kit 1 each by Does not apply route as needed for other. Check sugar 4 times daily, once before each meal and at bedtime 08/11/12   Catarina Hartshorn, MD  glucose monitoring kit (FREESTYLE) monitoring kit 1 each by Does not apply route as needed for other. 08/13/12   Tony Dhungel, MD  Lancets (ACCU-CHEK MULTICLIX) lancets Use as instructed 08/13/12   Theda Belfast Dhungel, MD  Lancets (ONETOUCH ULTRASOFT) lancets Use as instructed 08/11/12   Catarina Hartshorn, MD   Physical Exam: Filed Vitals:   08/21/12 1039  BP: 138/70  Pulse: 98  Temp: 97.3 F (36.3 C)  TempSrc: Axillary  Resp: 12  SpO2: 98%     General:  Well-developed, morbidly obese 57 year old male, lying in the ED, with altered mental status.  Unable to give  history  Eyes: mildly icteric, pupils equal and round  Neck: neck supple, no JVD  Cardiovascular: rrr no m/r/g  Respiratory: Decreased bs, unable to follow commands to inspire deeply.  Abdomen: Obese, +Caput medusa, No obvious fluid wave, Nt, +BS  Skin: mild jaundice, +Spider telengectasia  Musculoskeletal: unable to follow commands.  2-3+ pitting edema in lower extremities bi-laterally.  Psychiatric: awake, but completely unable to follow commands or respond coherently  Neurologic: unable to follow commands or answer questions.  Labs on Admission:  Basic Metabolic Panel:  Recent Labs Lab 08/21/12 1229  NA 119*  K 4.9  CL 85*  CO2 29  GLUCOSE 174*  BUN 17  CREATININE 0.89  CALCIUM 8.3*   Liver Function Tests:  Recent Labs Lab 08/21/12 1229  AST 112*  ALT 53  ALKPHOS 248*  BILITOT 3.4*  PROT 7.2  ALBUMIN 2.2*    Recent Labs Lab 08/21/12 1230  AMMONIA 178*   CBC:  Recent Labs Lab 08/21/12 1229  WBC 5.9  NEUTROABS 5.1  HGB 11.6*  HCT 31.4*  MCV 99.7  PLT 53*    Radiological Exams on Admission: Ct Head Wo Contrast  08/21/2012   *RADIOLOGY REPORT*  Clinical Data: Altered mental status  CT HEAD WITHOUT CONTRAST  Technique:  Contiguous axial images were obtained from the base of the skull through the vertex without contrast.  Comparison: Prior CT from 07/25/12 as well as earlier studies.  Findings: There is no acute intracranial hemorrhage or infarct.  No mass lesion or midline shift.  There is no extra-axial fluid collection.  Mild generalized atrophy is unchanged.  The calvarium is intact.  Small left mastoid effusion is again noted, grossly stable. The paranasal sinuses are clear.  IMPRESSION:  1.  Stable mild atrophy.  2. No acute intracranial process.   Original Report Authenticated By: Rise Mu, M.D.   Dg Chest Portable 1 View  08/21/2012   *RADIOLOGY REPORT*  Clinical Data: Altered mental status, history of hepatitis C and renal failure   PORTABLE CHEST - 1 VIEW  Comparison: Chest x-ray of 04/16/2012  Findings: The lungs are not well aerated.  There is mild cardiomegaly present and there may be mild pulmonary vascular congestion as well.  No acute bony abnormality is seen.  IMPRESSION: Poor inspiration.  Cardiomegaly.  Question mild pulmonary vascular congestion.   Original Report Authenticated By: Dwyane Dee, M.D.    EKG: Independently reviewed. Normal sinus  Assessment/Plan Principal Problem:   Hepatic encephalopathy Active Problems:   Seizure disorder   Hyponatremia   UTI (lower urinary tract infection)   Hepatic encephalopathy Ammonia level 178 CT head clear Secondary to end-stage liver disease, UTI, and likely poor medication compliance. Admit to step down for close monitoring N.p.o. until speech evaluation Will give lactulose per rectum or via nasogastric tube.  Patient is supposed to be on Xifaxan and lactulose at home Will need low salt diet and fluid restriction when he is able to eat.  Seizure disorder Witnessed mild seizure in the emergency department. Will dose Keppra 500 MG IV as prescribed for home meds. EEG ordered. If he has continued seizures will consult neuro.  Hyponatremia  acute on chronic Secondary to end-stage liver disease Will dose Lasix IV No IV fluids at this point Monitor bmet  Urinary tract infection Culture pending Cipro started in emergency department  Diabetes mellitus Will start on decreased dose of Lantus as he is n.p.o. Sliding scale moderate with every 4 hours CBG checks  Hypothyroidism Levothyroxine IV until he can take by mouth   Further recommendations will be forthcoming pending patient's clinical evolution and test results.  Code Status: full Family Communication:  Disposition Plan: Inpatient.  Step down.  Social work, PT, OT consulted.  Patient will likely need a higher level of care at discharge.  Time spent: [address] [facility] Pager [phone]  If 7PM-7AM, please contact night-coverage [email or web address] Password [facility] 08/21/2012, 3:37 PM

## 2012-08-21 NOTE — ED Notes (Signed)
Put 69fr caf. In.5:53pm JG.

## 2012-08-22 ENCOUNTER — Inpatient Hospital Stay (HOSPITAL_COMMUNITY): Payer: Medicare Other

## 2012-08-22 DIAGNOSIS — G40909 Epilepsy, unspecified, not intractable, without status epilepticus: Secondary | ICD-10-CM

## 2012-08-22 LAB — CBC
MCH: 36.7 pg — ABNORMAL HIGH (ref 26.0–34.0)
MCHC: 36 g/dL (ref 30.0–36.0)
Platelets: 36 10*3/uL — ABNORMAL LOW (ref 150–400)

## 2012-08-22 LAB — COMPREHENSIVE METABOLIC PANEL
AST: 113 U/L — ABNORMAL HIGH (ref 0–37)
CO2: 29 mEq/L (ref 19–32)
Calcium: 8.3 mg/dL — ABNORMAL LOW (ref 8.4–10.5)
Creatinine, Ser: 0.85 mg/dL (ref 0.50–1.35)
GFR calc Af Amer: >90 mL/min (ref >90)
GFR calc non Af Amer: >90 mL/min (ref >90)
Glucose, Bld: 99 mg/dL (ref 70–99)
Total Protein: 6.3 g/dL (ref 6.0–8.3)

## 2012-08-22 LAB — GLUCOSE, CAPILLARY
Glucose-Capillary: 97 mg/dL (ref 70–99)
Glucose-Capillary: 191 mg/dL — ABNORMAL HIGH (ref 70–99)
Glucose-Capillary: 165 mg/dL — ABNORMAL HIGH (ref 70–99)

## 2012-08-22 LAB — PROTIME-INR
INR: 1.8 — ABNORMAL HIGH (ref 0.00–1.49)
Prothrombin Time: 20.4 seconds — ABNORMAL HIGH (ref 11.6–15.2)

## 2012-08-22 LAB — AMMONIA: Ammonia: 54 umol/L (ref 11–60)

## 2012-08-22 MED ORDER — DEXTROSE 5 % IV SOLN
1.0000 g | INTRAVENOUS | Status: DC
  Administered 2012-08-22: 1 g via INTRAVENOUS
  Filled 2012-08-22 (×2): qty 10

## 2012-08-22 MED ORDER — RIFAXIMIN 550 MG PO TABS
550.0000 mg | ORAL_TABLET | Freq: Two times a day (BID) | ORAL | Status: DC
  Administered 2012-08-22 – 2012-08-26 (×10): 550 mg via ORAL
  Filled 2012-08-22 (×13): qty 1

## 2012-08-22 MED ORDER — INSULIN GLARGINE 100 UNIT/ML ~~LOC~~ SOLN
20.0000 [IU] | Freq: Every day | SUBCUTANEOUS | Status: DC
  Administered 2012-08-22 – 2012-08-26 (×5): 20 [IU] via SUBCUTANEOUS
  Filled 2012-08-22 (×6): qty 0.2

## 2012-08-22 MED ORDER — ADULT MULTIVITAMIN W/MINERALS CH
1.0000 | ORAL_TABLET | Freq: Every day | ORAL | Status: DC
  Administered 2012-08-22 – 2012-08-24 (×3): 1 via ORAL
  Filled 2012-08-22 (×3): qty 1

## 2012-08-22 MED ORDER — SPIRONOLACTONE 100 MG PO TABS
200.0000 mg | ORAL_TABLET | Freq: Every day | ORAL | Status: DC
  Administered 2012-08-22 – 2012-08-26 (×5): 200 mg via ORAL
  Filled 2012-08-22 (×6): qty 2

## 2012-08-22 MED ORDER — INSULIN ASPART 100 UNIT/ML ~~LOC~~ SOLN
0.0000 [IU] | Freq: Three times a day (TID) | SUBCUTANEOUS | Status: DC
  Administered 2012-08-22: 2 [IU] via SUBCUTANEOUS
  Administered 2012-08-22 – 2012-08-23 (×3): 3 [IU] via SUBCUTANEOUS
  Administered 2012-08-24: 5 [IU] via SUBCUTANEOUS
  Administered 2012-08-24: 2 [IU] via SUBCUTANEOUS
  Administered 2012-08-24: 5 [IU] via SUBCUTANEOUS
  Administered 2012-08-25 (×2): 3 [IU] via SUBCUTANEOUS
  Administered 2012-08-25: 2 [IU] via SUBCUTANEOUS
  Administered 2012-08-26: 3 [IU] via SUBCUTANEOUS
  Administered 2012-08-26: 2 [IU] via SUBCUTANEOUS

## 2012-08-22 MED ORDER — LEVETIRACETAM 500 MG PO TABS
500.0000 mg | ORAL_TABLET | Freq: Three times a day (TID) | ORAL | Status: DC
  Administered 2012-08-22 – 2012-08-25 (×10): 500 mg via ORAL
  Filled 2012-08-22 (×12): qty 1

## 2012-08-22 MED ORDER — PANTOPRAZOLE SODIUM 40 MG PO TBEC: 40.0000 mg | DELAYED_RELEASE_TABLET | Freq: Every day | ORAL | Status: DC

## 2012-08-22 MED ORDER — OXYCODONE HCL 5 MG PO TABS
5.0000 mg | ORAL_TABLET | ORAL | Status: DC | PRN
  Administered 2012-08-22 – 2012-08-25 (×15): 5 mg via ORAL
  Filled 2012-08-22 (×15): qty 1

## 2012-08-22 MED ORDER — LEVOTHYROXINE SODIUM 75 MCG PO TABS
75.0000 ug | ORAL_TABLET | Freq: Every day | ORAL | Status: DC
  Administered 2012-08-23 – 2012-08-27 (×5): 75 ug via ORAL
  Filled 2012-08-22 (×8): qty 1

## 2012-08-22 MED ORDER — FOLIC ACID 1 MG PO TABS
1.0000 mg | ORAL_TABLET | Freq: Every day | ORAL | Status: DC
  Administered 2012-08-22 – 2012-08-24 (×3): 1 mg via ORAL
  Filled 2012-08-22 (×3): qty 1

## 2012-08-22 MED ORDER — LACTULOSE 10 GM/15ML PO SOLN
20.0000 g | Freq: Three times a day (TID) | ORAL | Status: DC
  Administered 2012-08-22 – 2012-08-24 (×9): 20 g via ORAL
  Filled 2012-08-22 (×14): qty 30

## 2012-08-22 NOTE — Evaluation (Signed)
Occupational Therapy Evaluation Patient Details Name: Tony Boyd MRN: [id number] DOB: 03-24-1955 Today's Date: 08/22/2012 Time: 0102-7253 OT Time Calculation (min): 25 min  OT Assessment / Plan / Recommendation History of present illness He has known history of alcohol related liver disease with decompensated cirrhosis in addition to hepatitis C. He has had recent admission with hepatic encephalopathy. He also has history of seizure disorder, Korsakoff psychosis, bipolar disorder, ongoing alcohol abuse.  Pt also with recent admission 07/25/12 s/p fall and presented with left clavicle pain/swelling. Was given LUE sling. Unsure if he is to be wearing LUE sling.    Clinical Impression   Pt admitted from SNF (placed there for rehab) with hepatic encephalopathy.  Will continue to follow pt acutely in order to address below problem list. Recommending return to SNF to further progress rehab.    OT Assessment  Patient needs continued OT Services    Follow Up Recommendations  SNF    Barriers to Discharge      Equipment Recommendations  3 in 1 bedside comode    Recommendations for Other Services    Frequency  Min 2X/week    Precautions / Restrictions Precautions Precautions: Fall;Shoulder Type of Shoulder Precautions: Pt reports receiveing sling for LUE during previous admission. does not have sling with him currently.   Pertinent Vitals/Pain See vitals    ADL  Eating/Feeding: Performed;Set up Where Assessed - Eating/Feeding: Chair Upper Body Bathing: Simulated;Minimal assistance Where Assessed - Upper Body Bathing: Unsupported sitting Lower Body Bathing: Simulated;Maximal assistance Where Assessed - Lower Body Bathing: Unsupported sitting Upper Body Dressing: Performed;Moderate assistance Where Assessed - Upper Body Dressing: Unsupported sitting Lower Body Dressing: Performed;+1 Total assistance Where Assessed - Lower Body Dressing: Unsupported sitting Toilet Transfer:  Simulated;+2 Total assistance Toilet Transfer: Patient Percentage: 60% Toilet Transfer Method: Stand pivot Toilet Transfer Equipment:  (bed<>chair) Equipment Used: Gait belt Transfers/Ambulation Related to ADLs: +2 total assist for steadying and balance. Pt taking small shuffled steps when pivoting from bed to chair. ADL Comments: Pt generally weak and stiff from laying in bed.     OT Diagnosis: Generalized weakness;Acute pain  OT Problem List: Decreased strength;Decreased activity tolerance;Impaired balance (sitting and/or standing);Decreased knowledge of use of DME or AE;Pain OT Treatment Interventions: Self-care/ADL training;DME and/or AE instruction;Therapeutic activities;Patient/family education;Balance training   OT Goals(Current goals can be found in the care plan section) Acute Rehab OT Goals OT Goal Formulation: With patient Time For Goal Achievement: 08/29/12 Potential to Achieve Goals: Good  Visit Information  Last OT Received On: 08/22/12 Assistance Needed: +2 PT/OT Co-Evaluation/Treatment: Yes       Prior Functioning     Home Living Family/patient expects to be discharged to:: Skilled nursing facility Prior Function Level of Independence: Independent with assistive device(s) (prior to recent d/c to SNF) Communication Communication: No difficulties Dominant Hand: Right         Vision/Perception     Cognition  Cognition Arousal/Alertness: Awake/alert Behavior During Therapy: WFL for tasks assessed/performed Overall Cognitive Status: Impaired/Different from baseline Area of Impairment: Orientation Orientation Level: Disoriented to;Time    Extremity/Trunk Assessment Upper Extremity Assessment Upper Extremity Assessment: LUE deficits/detail LUE Deficits / Details: Recent admission for left clavicle swelling on 07/25/12 for which he received sling.  Pt reports he does not have sling currently. Noted in chart history that xrayon 07/25/12 was negative for  clavicle fx. Limited use of LUE during session. LUE: Unable to fully assess due to pain (unsure if pt is to be wearing his sling)  Mobility Bed Mobility Bed Mobility: Supine to Sit;Sitting - Scoot to Edge of Bed Supine to Sit: 4: Min guard;With rails;HOB elevated Sitting - Scoot to Edge of Bed: 4: Min guard Details for Bed Mobility Assistance: min guard for safety Transfers Transfers: Sit to Stand;Stand to Sit Sit to Stand: 1: +2 Total assist;From bed;With upper extremity assist Sit to Stand: Patient Percentage: 70% Stand to Sit: 1: +2 Total assist;To chair/3-in-1;With upper extremity assist Stand to Sit: Patient Percentage: 70% Details for Transfer Assistance: VCs for safety. Assist for steadying.     Exercise     Balance     End of Session OT - End of Session Equipment Utilized During Treatment: Gait belt Activity Tolerance: Patient tolerated treatment well Patient left: in chair;with call bell/phone within reach Nurse Communication: Mobility status  GO   08/22/2012 Cipriano Mile OTR/L Pager [phone] Office [phone]   Cipriano Mile 08/22/2012, 3:12 PM

## 2012-08-22 NOTE — Care Management Note (Addendum)
    Page 1 of 1   08/28/2012     10:33:31 AM   CARE MANAGEMENT NOTE 08/28/2012  Patient:  Tony Boyd, Tony Boyd   Account Number:  [id number]  Date Initiated:  08/22/2012  Documentation initiated by:  Junius Creamer  Subjective/Objective Assessment:   adm w hyponatremia     Action/Plan:   lives alone   Anticipated DC Date:     Anticipated DC Plan:    In-house referral  Clinical Social Worker      DC Associate Professor  CM consult      Choice offered to / List presented to:             Status of service:   Medicare Important Message given?   (If response is "NO", the following Medicare IM given date fields will be blank) Date Medicare IM given:   Date Additional Medicare IM given:    Discharge Disposition:    Per UR Regulation:  Reviewed for med. necessity/level of care/duration of stay  If discussed at Long Length of Stay Meetings, dates discussed:   08/28/2012    Comments:   08-28-12 1018 Pt was discussed in LOS rounds and pt was on IV keppra as of yesterday- Recomendations were made  to see if pt was eligible for LTAC, however keppra changed to po and pt not eligible for LTAC now.  MD wants to monitor pt a couple of more days on po keppra. Plan will be for SNF once stable to [facility]- No further needs from CM at this time.  08-27-12 Tomi Bamberger, RN,BSN [phone]  Hepatic encephalopathy-stable but remains confused Ammonia level 32 (6/30). Seizures today and iv keppra on board. diuresing with iv lasix. plan for snf once stable

## 2012-08-22 NOTE — Progress Notes (Addendum)
[facility] Progress Note [place] TEAM 1 - Stepdown/ICU TEAM   Tony Boyd UJW:[id number] DOB: 07-12-1955 DOA: 08/21/2012 PCP: [name], DO  Brief narrative: 57 y.o. male with a history of end-stage liver disease, hepatitis C, esophageal varices, bipolar disorder, alcohol abuse, type 2 diabetes, and hypothyroidism, who was recently discharged from [facility] on June 19 after an inpatient stay for hepatic encephalopathy. He returned to the emergency department from independent living with hepatic encephalopathy, urinary tract infection, hyponatremia, and pleural effusions. He was unable to give any coherent history due to his encephalopathy. During examination in the emergency department he had a mild seizure. It appeared likely that he had not been taking his medications since discharge. No family members were present to provide history.  Assessment/Plan:  Hepatic encephalopathy Ammonia level 178 at presentation - CT head clear - pt much improved clinically today with dosing of usual home meds + tx of UTI - suspect etiology was combination of noncompliance + UTI - cont to treat each issue - as per GI "agree with lactulose 3 times daily as well as Xifaxan twice daily, indefinitely"  Hyponatremia Improving with diuretic use - cont and f/u in AM  Seizure disorder with seizure in ED fetlt to be due to medication noncompliance - cont prescribed meds and follow trend - change abx to rocephin as cipro is infamous for lowering Sz threshold  Cirrhosis of liver - hepatitis C + EtOH abuse  Cont med tx   UTI (lower urinary tract infection) change abx to rocephin as cipro is infamous for lowering Sz threshold - f/u cx as available   DM CBG well controlled at the present time  Hypothyroid TSH was 2.323 on 08/12/2012 - no change in treatment given question of noncompliance  Bipolar D/O Continue usual outpatient meds  Pain in left shoulder The patient swears to the nurse that he  has a broken clavicle and needs narcotic pain medication and a sling - he reports that he was told this during a recent doctor visit - [facility] records include an x-ray of the clavicle 07/25/2012 with no evidence of a fracture - the patient cannot recall which physician made this diagnosis - physical exam was unrevealing - I have ordered a repeat x-ray to be absolutely certain the clavicle is intact - I have resumed his oxycodone but at a lower dose than he reports he was using as an outpatient due to his confusion at presentation  Code Status: FULL Family Communication: No family present at time of exam Disposition Plan: Stable for transfer to medical bed - will ultimately need placement in a SNF  Consultants: [facility]  Procedures: EEG 6/26 - severe generalized continuous slowing of cerebral activity, consistent with severe diffuse encephalopathy, consistent with patient's history of acute recurrent hepatic encephalopathy - no seizure activity was recorded  Antibiotics: Ciprofloxacin 6/26>> 6/27 Rocephin 6/27>>  DVT prophylaxis: SCDs  HPI/Subjective: The patient is alert and conversant.  He is slightly confused but much more alert than at his presentation.  He is able to answer most questions appropriately.  He denies chest pain fevers chills nausea or vomiting.  He complains of being hungry.  He complains of pain in his left shoulder.  Objective: Blood pressure 115/50, pulse 80, temperature 98.8 F (37.1 C), temperature source Oral, resp. rate 12, height 5\' 8"  (1.727 m), weight 125.1 kg (275 lb 12.7 oz), SpO2 95.00%.  Intake/Output Summary (Last 24 hours) at 08/22/12 1103 Last data filed at 08/22/12 5621  Gross  per 24 hour  Intake    613 ml  Output   2875 ml  Net  -2262 ml    Exam: General: No acute respiratory distress Lungs: Clear to auscultation bilaterally without wheezes or crackles Cardiovascular: Regular rate and rhythm without murmur gallop or rub normal S1 and  S2 Abdomen: Nontender, soft, bowel sounds positive, no rebound, no ascites, no appreciable mass Extremities: No significant cyanosis, clubbing;  2+ edema bilateral lower extremities  Data Reviewed: Basic Metabolic Panel:  Recent Labs Lab 08/21/12 1229 08/22/12 0740  NA 119* 126*  K 4.9 3.9  CL 85* 92*  CO2 29 29  GLUCOSE 174* 99  BUN 17 14  CREATININE 0.89 0.85  CALCIUM 8.3* 8.3*   Liver Function Tests:  Recent Labs Lab 08/21/12 1229 08/22/12 0740  AST 112* 113*  ALT 53 51  ALKPHOS 248* 212*  BILITOT 3.4* 3.1*  PROT 7.2 6.3  ALBUMIN 2.2* 2.0*    Recent Labs Lab 08/21/12 1230 08/22/12 0740  AMMONIA 178* 54   CBC:  Recent Labs Lab 08/21/12 1229 08/21/12 1806 08/22/12 0740  WBC 5.9  --  2.9*  NEUTROABS 5.1  --   --   HGB 11.6* 11.6* 10.9*  HCT 31.4* 31.6* 30.3*  MCV 99.7  --  102.0*  PLT 53*  --  36*   CBG:  Recent Labs Lab 08/21/12 1612 08/21/12 2013 08/22/12 0607 08/22/12 0748  GLUCAP 156* 163* 98 97    Recent Results (from the past 240 hour(s))  URINE CULTURE     Status: None   Collection Time    08/12/12  6:00 PM      Result Value Range Status   Specimen Description URINE, CLEAN CATCH   Final   Special Requests NONE   Final   Culture  Setup Time 08/13/2012 01:26   Final   Colony Count 50,000 COLONIES/ML   Final   Culture     Final   Value: Multiple bacterial morphotypes present, none predominant. Suggest appropriate recollection if clinically indicated.   Report Status 08/14/2012 FINAL   Final  CULTURE, BLOOD (ROUTINE X 2)     Status: None   Collection Time    08/21/12 12:50 PM      Result Value Range Status   Specimen Description BLOOD RIGHT FEMORAL ARTERY   Final   Special Requests BOTTLES DRAWN AEROBIC ONLY 5CC   Final   Culture  Setup Time 08/21/2012 16:39   Final   Culture     Final   Value:        BLOOD CULTURE RECEIVED NO GROWTH TO DATE CULTURE WILL BE HELD FOR 5 DAYS BEFORE ISSUING A FINAL NEGATIVE REPORT   Report Status  PENDING   Incomplete  MRSA PCR SCREENING     Status: None   Collection Time    08/21/12  8:07 PM      Result Value Range Status   MRSA by PCR NEGATIVE  NEGATIVE Final   Comment:            The GeneXpert MRSA Assay (FDA     approved for NASAL specimens     only), is one component of a     comprehensive MRSA colonization     surveillance program. It is not     intended to diagnose MRSA     infection nor to guide or     monitor treatment for     MRSA infections.     Studies:  Recent x-ray  studies have been reviewed in detail by the Attending Physician  Scheduled Meds:  Scheduled Meds: . ciprofloxacin  400 mg Intravenous Q12H  . furosemide  80 mg Intravenous BID  . insulin aspart  0-15 Units Subcutaneous Q4H  . insulin glargine  30 Units Subcutaneous QHS  . levETIRAcetam  500 mg Intravenous TID  . levothyroxine  50 mcg Intravenous Daily  . pantoprazole  40 mg Oral Daily  . sodium chloride  3 mL Intravenous Q12H  . sodium chloride  3 mL Intravenous Q12H    Time spent on care of this patient:   [facility] T  [facility] Office  [phone] Pager - Text Page per Loretha Stapler as per below:  On-Call/Text Page:      [email or web address]      password [email or web address]  If 7PM-7AM, please contact night-coverage [email or web address] Password [email or web address] 08/22/2012, 11:03 AM   LOS: 1 day

## 2012-08-22 NOTE — Progress Notes (Signed)
Report completed to Maureen Ralphs, nurse on [facility]. Pt will transfer to med surg unit room 4N09.  Pt informed of room number and will transfer with belongings via wheelchair.

## 2012-08-22 NOTE — Evaluation (Signed)
Clinical/Bedside Swallow Evaluation Patient Details  Name: Tony Boyd MRN: [id number] Date of Birth: 21-Apr-1955  Today's Date: 08/22/2012 Time: 0920-1005 SLP Time Calculation (min): 45 min  Past Medical History:  Past Medical History  Diagnosis Date  . Cirrhosis of liver   . Hepatitis C   . ETOH abuse   . Hypothyroid   . Psychosis   . Bipolar disorder   . Seizures   . Arthritis   . Back pain   . Coagulopathy     Hx of  . Thrombocytopenia     Hx of  . Pancytopenia     Hx of  . H/O hypokalemia   . Hyponatremia     Hx of  . Korsakoff psychosis   . H/O abdominal abscess   . H/O esophageal varices   . Metabolic encephalopathy   . Hepatic encephalopathy   . Urinary urgency   . H/O renal failure   . H/O ascites   . Altered mental status   . Anemia   . Ascites   . Shortness of breath   . Peripheral vascular disease   . GERD (gastroesophageal reflux disease)   . Thrombocytopenia, acquired 06/02/2012  . Unspecified hypothyroidism 06/02/2012  . Pain in joint, pelvic region and thigh   . Edema   . Obesity, unspecified   . Chest pain, unspecified   . Pneumonitis due to inhalation of food or vomitus   . Other convulsions   . Cellulitis and abscess of upper arm and forearm   . Chronic hepatitis C without mention of hepatic coma   . Hypopotassemia   . Anemia, unspecified   . Other and unspecified coagulation defects   . Thrombocytopenia, unspecified   . Bipolar I disorder, most recent episode (or current) unspecified   . Unspecified psychosis   . Encephalopathy   . Esophageal varices without mention of bleeding   . Esophagitis, unspecified   . Abscess of liver(572.0)   . Chronic kidney disease, unspecified   . Lumbago   . Personal history of alcoholism   . Personal history of tobacco use, presenting hazards to health   . Cirrhosis of liver without mention of alcohol   . Pneumonitis due to inhalation of food or vomitus 09/19/2010  . Chronic hepatitis C without mention  of hepatic coma   . Bipolar I disorder, most recent episode (or current) unspecified   . Unspecified psychosis   . Other ascites   . Personal history of tobacco use, presenting hazards to health   . Cirrhosis of liver without mention of alcohol   . Type 2 diabetes mellitus with diabetic neuropathy    Past Surgical History:  Past Surgical History  Procedure Laterality Date  . Colostomy    . Cholecystectomy    . Small intestine surgery    . Arm surgery      left arm  . US guided drain placement in ventral hernia abscess  07/21/2010  . Multiple picc line placements    . Esophagogastroduodenoscopy  06/28/2011    Procedure: ESOPHAGOGASTRODUODENOSCOPY (EGD);  Surgeon: Louis Meckel, MD;  Location: [facility];  Service: Endoscopy;  Laterality: N/A;  . Esophagogastroduodenoscopy N/A 05/06/2012    Procedure: ESOPHAGOGASTRODUODENOSCOPY (EGD);  Surgeon: Louis Meckel, MD;  Location: [facility];  Service: Endoscopy;  Laterality: N/A;  . Esophageal banding N/A 05/06/2012    Procedure: ESOPHAGEAL BANDING;  Surgeon: Louis Meckel, MD;  Location: [facility];  Service: Endoscopy;  Laterality: N/A;   HPI:  Tony Boyd is  a 57 y.o. male with a history of end-stage liver disease, hepatitis C, esophageal varices, bipolar disorder, alcohol abuse, type 2 diabetes, and hypothyroidism, who was recently discharged from [facility] on June 19 after an inpatient stay for hepatic encephalopathy.  He presented to the ED from independent living with hepatic encephalopathy, urinary tract infection, hyponatremia, pleural effusions. H/o GERD.   Assessment / Plan / Recommendation Clinical Impression  Patient presents with a mild pharyngeal phase dysphagia, characterized by decreased pharyngeal contraction and laryngeal elevation during swallow with PO's tested (thin liquids, ice chips, puree solids). Patient currently has NG tube, and he did c/o pain in throat when swallowing saliva. SLP did not assess  patient with regular solid texture PO's but recommend that this be completed after NG tube removed. Gastroenterologist recommended patient be initiated on clear liquids diet when safe to consume PO's. SLP recommends that MD clarify diet recommendations for least restrictive diet.    Aspiration Risk  Mild    Diet Recommendation Dysphagia 1 (Puree);Thin liquid   Liquid Administration via: Cup;Straw Medication Administration: Crushed with puree Supervision: Intermittent supervision to cue for compensatory strategies Compensations: Slow rate;Small sips/bites;Follow solids with liquid Postural Changes and/or Swallow Maneuvers: Seated upright 90 degrees;Upright 30-60 min after meal    Other  Recommendations Oral Care Recommendations: Oral care BID   Follow Up Recommendations       Frequency and Duration min 2x/week  2 weeks   Pertinent Vitals/Pain     SLP Swallow Goals Patient will consume recommended diet without observed clinical signs of aspiration with: Supervision/safety;Minimal assistance Patient will utilize recommended strategies during swallow to increase swallowing safety with: Minimal assistance;Supervision/safety   Swallow Study Prior Functional Status       General Date of Onset: 08/21/12 HPI: Tony Boyd is a 57 y.o. male with a history of end-stage liver disease, hepatitis C, esophageal varices, bipolar disorder, alcohol abuse, type 2 diabetes, and hypothyroidism, who was recently discharged from [facility] on June 19 after an inpatient stay for hepatic encephalopathy.  He presented to the ED from independent living with hepatic encephalopathy, urinary tract infection, hyponatremia, pleural effusions. H/o GERD. Diet Prior to this Study: Regular;Thin liquids (per patient report) Temperature Spikes Noted: No Respiratory Status: Room air Behavior/Cognition: Alert;Cooperative;Pleasant mood;Confused Oral Cavity - Dentition: Adequate natural  dentition Self-Feeding Abilities: Able to feed self;Needs set up Patient Positioning: Upright in bed Baseline Vocal Quality: Clear Volitional Cough: Strong Volitional Swallow: Able to elicit    Oral/Motor/Sensory Function Overall Oral Motor/Sensory Function: Appears within functional limits for tasks assessed   Ice Chips Ice chips: Within functional limits   Thin Liquid Thin Liquid: Impaired Presentation: Cup;Straw Pharyngeal  Phase Impairments: Suspected delayed Swallow Other Comments: No overt s/s aspiration noted, suspect that presence of NG tube impacting pharyngeal phase of swallow.    Nectar Thick Nectar Thick Liquid: Not tested   Honey Thick Honey Thick Liquid: Not tested   Puree Puree: Within functional limits Presentation: Spoon Other Comments: No overt s/s aspiration. Patient did not report any difficulty or pain with swallow.   Solid   GO    Solid: Not tested Other Comments: Not tested secondary to patient report of pain in throat when swallowing, as well as NG tube still in place and patient with h/o GERD.       Elio Forget Tony Boyd 08/22/2012,12:50 PM   Angela Nevin, MA, CCC-SLP [facility] Speech-Language Pathologist

## 2012-08-22 NOTE — Progress Notes (Signed)
Patient ID: Tony Boyd, male   DOB: 14-Mar-1955, 57 y.o.   MRN: [id number] [facility] Progress Note  Subjective: Awake, animated, and asking for pain medicine... Hungry. NG tube still in and clamped.  Objective:  Vital signs in last 24 hours: Temp:  [97.3 F (36.3 C)-99 F (37.2 C)] 98.8 F (37.1 C) (06/27 0800) Pulse Rate:  [80-98] 80 (06/27 0517) Resp:  [12-14] 12 (06/27 0517) BP: (115-138)/(50-70) 115/50 mmHg (06/27 0517) SpO2:  [95 %-98 %] 95 % (06/27 0517) Weight:  [275 lb 12.7 oz (125.1 kg)] 275 lb 12.7 oz (125.1 kg) (06/26 2005) Last BM Date: 08/22/12 General:   Alert,  Well-developed, WM    in NAD Heart:  Regular rate and rhythm; no murmurs Pulm;clear ant Abdomen:  Soft,large  nontender and nondistended. Normal bowel sounds, without guarding, and without rebound.   Extremities:  Chronic stasis changes, and 2= edema Neurologic:  Alert and  oriented x2;  Slight asterixis. Psych:  Alert and cooperative. Normal mood and affect.  Intake/Output from previous day: 06/26 0701 - 06/27 0700 In: 613 [I.V.:3; IV Piggyback:610] Out: 2100 [Urine:2100] Intake/Output this shift: Total I/O In: -  Out: 775 [Urine:775]  Lab Results:  Recent Labs  08/21/12 1229 08/21/12 1806 08/22/12 0740  WBC 5.9  --  2.9*  HGB 11.6* 11.6* 10.9*  HCT 31.4* 31.6* 30.3*  PLT 53*  --  36*   BMET  Recent Labs  08/21/12 1229 08/22/12 0740  NA 119* 126*  K 4.9 3.9  CL 85* 92*  CO2 29 29  GLUCOSE 174* 99  BUN 17 14  CREATININE 0.89 0.85  CALCIUM 8.3* 8.3*   LFT  Recent Labs  08/22/12 0740  PROT 6.3  ALBUMIN 2.0*  AST 113*  ALT 51  ALKPHOS 212*  BILITOT 3.1*   PT/INR  Recent Labs  08/21/12 1229 08/22/12 0740  LABPROT 18.0* 20.4*  INR 1.53* 1.80*      Assessment / Plan: #1 Recurrent hepatic encephalopathy with AMS- much improved today-probably precipitated by UTI Continue Chronulac 30 gm  3 x daily for now Add Xifaxan 550 BID and leave on at  discharge Ok to advance diet #2 Self-limited small volume blood per NG - consistent with NG trauma- no evidience for GI bleeding Remove NG  Continue PPI once daily #3 Decompensated cirrhosis #4 Previously documented varices-grade 3- no prior bleeding, no prior banding- resume nadolol 40mg  daily.  Gi will be available  If needed. Principal Problem:   Hepatic encephalopathy Active Problems:   Hyponatremia   Seizure disorder   UTI (lower urinary tract infection)     LOS: 1 day   Amy Esterwood  08/22/2012, 10:33 AM   GI ATTENDING  Interval history and laboratories reviewed. Patient personally seen and examined. Agree with H&P as outlined above. Mental status is improved today. Still with some asterixis. Some confusion, but much less. Requesting pain medications. Denied. No significant GI bleeding. Agree with lactulose 3 times daily as well as Xifaxan twice daily, indefinitely. Outpatient GI followup with Dr. Arlyce Dice on an as-needed basis. We are available for questions. Will sign off  Caroljean Monsivais N. Eda Keys., M.D. [facility] Division of Gastroenterology

## 2012-08-22 NOTE — Progress Notes (Signed)
Attempted to call report, nurse in pt room, NS request call back to nurses direct line in a few, @ [phone] Maureen Ralphs.

## 2012-08-22 NOTE — Evaluation (Signed)
Physical Therapy Evaluation Patient Details Name: Tony Boyd MRN: [id number] DOB: 15-Nov-1955 Today's Date: 08/22/2012 Time: 7829-5621 PT Time Calculation (min): 25 min  PT Assessment / Plan / Recommendation History of Present Illness  Tony Boyd is an 57 y.o. male with hepatic failure presenting with recurrent hepatic encephalopathy with seizure activity. 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 He has had recent admission with hepatic encephalopathy. He also has history of seizure disorder, Korsakoff psychosis, bipolar disorder, ongoing alcohol abuse. Pt also with recent admission 07/25/12 s/p fall and presented with left clavicle pain/swelling. Was given LUE sling. Unsure if he is to be wearing LUE sling.   Clinical Impression  Pt will benefit from acute PT services to improve overall mobility and prepare for safe d/c to next venue.     PT Assessment  Patient needs continued PT services    Follow Up Recommendations  SNF    Equipment Recommendations  None recommended by PT    Frequency Min 3X/week    Precautions / Restrictions Precautions Precautions: Fall;Shoulder Type of Shoulder Precautions: Pt reports receiveing sling for LUE during previous admission. does not have sling with him currently.   Pertinent Vitals/Pain       Mobility  Bed Mobility Bed Mobility: Supine to Sit;Sitting - Scoot to Edge of Bed Supine to Sit: 4: Min guard;With rails;HOB elevated Sitting - Scoot to Edge of Bed: 4: Min guard Details for Bed Mobility Assistance: min guard for safety Transfers Transfers: Sit to Stand;Stand to Sit Sit to Stand: 1: +2 Total assist;From bed;With upper extremity assist Sit to Stand: Patient Percentage: 70% Stand to Sit: 1: +2 Total assist;To chair/3-in-1;With upper extremity assist Stand to Sit: Patient Percentage: 70% Details for Transfer Assistance: VCs for safety. Assist for  steadying. Ambulation/Gait Ambulation/Gait Assistance: 1: +2 Total assist Ambulation/Gait: Patient Percentage: 60% Ambulation Distance (Feet): 4 Feet Assistive device: 2 person hand held assist Ambulation/Gait Assistance Details: (A) to maintain balance and VCs for proper step sequence. Gait Pattern: Step-through pattern;Decreased stride length Stairs: No Wheelchair Mobility Wheelchair Mobility: No    Exercises     PT Diagnosis: Difficulty walking;Generalized weakness;Acute pain  PT Problem List: Decreased strength;Decreased activity tolerance;Decreased balance;Decreased mobility;Decreased knowledge of use of DME;Cardiopulmonary status limiting activity;Obesity PT Treatment Interventions: DME instruction;Gait training;Functional mobility training;Therapeutic activities;Therapeutic exercise;Balance training;Patient/family education     PT Goals(Current goals can be found in the care plan section) Acute Rehab PT Goals Patient Stated Goal: Pt wants to return to rehab PT Goal Formulation: With patient Time For Goal Achievement: 09/05/12 Potential to Achieve Goals: Good  Visit Information  Last PT Received On: 08/22/12 Assistance Needed: +2 PT/OT Co-Evaluation/Treatment: Yes History of Present Illness: Tony Boyd is an 57 y.o. male with hepatic failure presenting with recurrent hepatic encephalopathy with seizure activity.       Prior Functioning  Home Living Family/patient expects to be discharged to:: Skilled nursing facility Prior Function Level of Independence: Independent with assistive device(s) (prior to recent d/c to SNF) Communication Communication: No difficulties Dominant Hand: Right    Cognition  Cognition Arousal/Alertness: Awake/alert Behavior During Therapy: WFL for tasks assessed/performed Overall Cognitive Status: Impaired/Different from baseline Area of Impairment: Orientation Orientation Level: Disoriented to;Time    Extremity/Trunk Assessment Upper  Extremity Assessment Upper Extremity Assessment: LUE deficits/detail LUE Deficits / Details: Recent admission for left clavicle swelling on 07/25/12 for which he received sling.  Pt reports he does not have sling currently. Noted in chart history  that xrayon 07/25/12 was negative for clavicle fx. Limited use of LUE during session. LUE: Unable to fully assess due to pain (unsure if pt is to be wearing his sling) Lower Extremity Assessment Lower Extremity Assessment: Generalized weakness   Balance Balance Balance Assessed: Yes Static Sitting Balance Static Sitting - Balance Support: Feet supported Static Sitting - Level of Assistance: 5: Stand by assistance  End of Session PT - End of Session Equipment Utilized During Treatment: Gait belt Activity Tolerance: Patient tolerated treatment well Patient left: in chair;with call bell/phone within reach Nurse Communication: Mobility status  GP     Tony Boyd 08/22/2012, 3:22 PM Tony Boyd, PT DPT [phone]

## 2012-08-22 NOTE — Procedures (Signed)
ELECTROENCEPHALOGRAM REPORT   Patient: Tony Boyd       Room #: 2610 EEG No. ID: [id number] Age: 57 y.o.        Sex: male Referring Physician: Dr. Sharon Seller Report Date:  08/22/2012        Interpreting Physician: Aline Brochure  History: [name] is an 57 y.o. male with hepatic failure presenting with recurrent hepatic encephalopathy with seizure activity. Patient was on Keppra 500 mg 3 times a day on admission.    Indications for study:  Assess severity of encephalopathy as well as to rule out ongoing seizure activity.  Technique: This is an 18 channel routine scalp EEG performed at the bedside with bipolar and monopolar montages arranged in accordance to the international 10/20 system of electrode placement.   Description: EEG recording was performed during wakefulness and drowsiness. Predominant background activity consisted of moderate to slightly high amplitude 1-2 Hz diffuse somewhat rhythmic delta activity which was the highest amplitude in the frontal regions. At times the activity in the frontal region was triphasic in appearance. Photic stimulation was not performed. Hyperventilation was not performed. No epileptiform discharges recorded.  Interpretation: EEG is abnormal with severe generalized continuous slowing of cerebral activity, consistent with severe diffuse encephalopathy, consistent with patient's history of acute recurrent hepatic encephalopathy. No seizure activity was recorded.   Venetia Maxon M.D. [facility] [phone]

## 2012-08-22 NOTE — Progress Notes (Signed)
Attempted to call to give report to charge nurse, she is unable to receive at this time. Will attempt again.

## 2012-08-22 NOTE — Progress Notes (Signed)
Attempted to call back, nurse had been off floor, at lunch still, unable to take report at this time.

## 2012-08-23 DIAGNOSIS — E118 Type 2 diabetes mellitus with unspecified complications: Secondary | ICD-10-CM

## 2012-08-23 DIAGNOSIS — R5383 Other fatigue: Secondary | ICD-10-CM

## 2012-08-23 DIAGNOSIS — N390 Urinary tract infection, site not specified: Secondary | ICD-10-CM

## 2012-08-23 DIAGNOSIS — R5381 Other malaise: Secondary | ICD-10-CM

## 2012-08-23 DIAGNOSIS — E1165 Type 2 diabetes mellitus with hyperglycemia: Secondary | ICD-10-CM

## 2012-08-23 LAB — CBC
HCT: 33.1 % — ABNORMAL LOW (ref 39.0–52.0)
MCV: 102.2 fL — ABNORMAL HIGH (ref 78.0–100.0)
Platelets: 43 10*3/uL — ABNORMAL LOW (ref 150–400)
RBC: 3.24 MIL/uL — ABNORMAL LOW (ref 4.22–5.81)
RDW: 15.2 % (ref 11.5–15.5)
WBC: 4.8 10*3/uL (ref 4.0–10.5)

## 2012-08-23 LAB — URINE CULTURE: Colony Count: >=100000

## 2012-08-23 LAB — COMPREHENSIVE METABOLIC PANEL
Albumin: 2 g/dL — ABNORMAL LOW (ref 3.5–5.2)
Alkaline Phosphatase: 230 U/L — ABNORMAL HIGH (ref 39–117)
BUN: 14 mg/dL (ref 6–23)
Calcium: 7.9 mg/dL — ABNORMAL LOW (ref 8.4–10.5)
Creatinine, Ser: 0.8 mg/dL (ref 0.50–1.35)
GFR calc Af Amer: >90 mL/min (ref >90)
Glucose, Bld: 108 mg/dL — ABNORMAL HIGH (ref 70–99)
Total Protein: 6.8 g/dL (ref 6.0–8.3)

## 2012-08-23 LAB — GLUCOSE, CAPILLARY
Glucose-Capillary: 182 mg/dL — ABNORMAL HIGH (ref 70–99)
Glucose-Capillary: 182 mg/dL — ABNORMAL HIGH (ref 70–99)

## 2012-08-23 MED ORDER — ALBUTEROL SULFATE HFA 108 (90 BASE) MCG/ACT IN AERS
1.0000 | INHALATION_SPRAY | Freq: Four times a day (QID) | RESPIRATORY_TRACT | Status: DC | PRN
  Filled 2012-08-23: qty 6.7

## 2012-08-23 MED ORDER — FUROSEMIDE 80 MG PO TABS
80.0000 mg | ORAL_TABLET | Freq: Two times a day (BID) | ORAL | Status: DC
  Administered 2012-08-23 – 2012-08-27 (×8): 80 mg via ORAL
  Filled 2012-08-23 (×11): qty 1

## 2012-08-23 MED ORDER — NITROFURANTOIN MACROCRYSTAL 100 MG PO CAPS
100.0000 mg | ORAL_CAPSULE | Freq: Two times a day (BID) | ORAL | Status: DC
  Filled 2012-08-23 (×2): qty 1

## 2012-08-23 MED ORDER — NITROFURANTOIN MONOHYD MACRO 100 MG PO CAPS
100.0000 mg | ORAL_CAPSULE | Freq: Two times a day (BID) | ORAL | Status: DC
  Administered 2012-08-23 – 2012-08-24 (×3): 100 mg via ORAL
  Filled 2012-08-23 (×4): qty 1

## 2012-08-23 NOTE — ED Provider Notes (Signed)
I saw and evaluated the patient, reviewed the resident's note and I agree with the findings and plan.  Please see completed note.  Raeford Razor, MD 08/23/12 1659

## 2012-08-23 NOTE — Progress Notes (Addendum)
Chart reviewed.  [facility]  Tony Boyd WUJ:[id number] DOB: 04-Jun-1955 DOA: 08/21/2012 PCP: [name], DO  Brief narrative: 57 y.o. male with a history of end-stage liver disease, hepatitis C, esophageal varices, bipolar disorder, alcohol abuse, type 2 diabetes, and hypothyroidism, who was recently discharged from [facility] on June 19 after an inpatient stay for hepatic encephalopathy. He returned to the emergency department from independent living with hepatic encephalopathy, urinary tract infection, hyponatremia, and pleural effusions. He was unable to give any coherent history due to his encephalopathy. During examination in the emergency department he had a mild seizure. It appeared likely that he had not been taking his medications since discharge. No family members were present to provide history.  Assessment/Plan:  Hepatic encephalopathy Ammonia level 178 at presentation - CT head clear - not encephalopathic today.  suspect etiology was combination of noncompliance + UTI - cont to treat each issue - as per GI "agree with lactulose 3 times daily as well as Xifaxan twice daily, indefinitely"  Hyponatremia Chronic  Seizure disorder with seizure in ED fetlt to be due to medication noncompliance - none further.  Cirrhosis of liver - hepatitis C + EtOH abuse  Cont med tx   UTI (lower urinary tract infection) Culture shows coag-negative staph resistant to Rocephin. Will change to nitrofurantoin.  DM CBG well controlled at the present time  Hypothyroid TSH was 2.323 on 08/12/2012 - no change in treatment given question of noncompliance  Bipolar D/O Continue usual outpatient meds  Pain in left shoulder X-ray negative  Code Status: FULL Family Communication: No family present at time of exam Disposition Plan: Stable for transfer to medical bed - will ultimately need placement in a SNF  Consultants: [facility]  Procedures: EEG 6/26 - severe generalized  continuous slowing of cerebral activity, consistent with severe diffuse encephalopathy, consistent with patient's history of acute recurrent hepatic encephalopathy - no seizure activity was recorded  Antibiotics: Ciprofloxacin 6/26>> 6/27 Rocephin 6/27>>  DVT prophylaxis: SCDs  HPI/Subjective: No new complaints. Willing to go to skilled nursing facility.  Objective: Blood pressure 114/58, pulse 83, temperature 98.4 F (36.9 C), temperature source Oral, resp. rate 18, height 6' (1.829 m), weight 125.1 kg (275 lb 12.7 oz), SpO2 100.00%.  Intake/Output Summary (Last 24 hours) at 08/23/12 1440 Last data filed at 08/23/12 1300  Gross per 24 hour  Intake   1680 ml  Output   1800 ml  Net   -120 ml    Exam: General: alert, oriented. Comfortable. Cooperative. Lungs: Clear to auscultation bilaterally without wheezes or crackles Cardiovascular: Regular rate and rhythm without murmur gallop or rub normal S1 and S2 Abdomen: Nontender, soft, nontender Extremities: No significant cyanosis, clubbing;  2+ edema bilateral lower extremities  Data Reviewed: Basic Metabolic Panel:  Recent Labs Lab 08/21/12 1229 08/22/12 0740 08/23/12 0535  NA 119* 126* 124*  K 4.9 3.9 4.1  CL 85* 92* 89*  CO2 29 29 29   GLUCOSE 174* 99 108*  BUN 17 14 14   CREATININE 0.89 0.85 0.80  CALCIUM 8.3* 8.3* 7.9*   Liver Function Tests:  Recent Labs Lab 08/21/12 1229 08/22/12 0740 08/23/12 0535  AST 112* 113* 139*  ALT 53 51 57*  ALKPHOS 248* 212* 230*  BILITOT 3.4* 3.1* 3.5*  PROT 7.2 6.3 6.8  ALBUMIN 2.2* 2.0* 2.0*    Recent Labs Lab 08/21/12 1230 08/22/12 0740 08/23/12 0535  AMMONIA 178* 54 44   CBC:  Recent Labs Lab 08/21/12 1229 08/21/12 1806 08/22/12 0740  08/23/12 0535  WBC 5.9  --  2.9* 4.8  NEUTROABS 5.1  --   --   --   HGB 11.6* 11.6* 10.9* 11.9*  HCT 31.4* 31.6* 30.3* 33.1*  MCV 99.7  --  102.0* 102.2*  PLT 53*  --  36* 43*   CBG:  Recent Labs Lab 08/22/12 1141  08/22/12 1655 08/22/12 2130 08/23/12 0629 08/23/12 1200  GLUCAP 126* 191* 165* 99 182*    Recent Results (from the past 240 hour(s))  CULTURE, BLOOD (ROUTINE X 2)     Status: None   Collection Time    08/21/12 12:50 PM      Result Value Range Status   Specimen Description BLOOD RIGHT FEMORAL ARTERY   Final   Special Requests BOTTLES DRAWN AEROBIC ONLY 5CC   Final   Culture  Setup Time 08/21/2012 16:39   Final   Culture     Final   Value:        BLOOD CULTURE RECEIVED NO GROWTH TO DATE CULTURE WILL BE HELD FOR 5 DAYS BEFORE ISSUING A FINAL NEGATIVE REPORT   Report Status PENDING   Incomplete  URINE CULTURE     Status: None   Collection Time    08/21/12  1:43 PM      Result Value Range Status   Specimen Description URINE, CATHETERIZED   Final   Special Requests NONE   Final   Culture  Setup Time 08/21/2012 14:50   Final   Colony Count >=100,000 COLONIES/ML   Final   Culture     Final   Value: STAPHYLOCOCCUS SPECIES (COAGULASE NEGATIVE)     Note: RIFAMPIN AND GENTAMICIN SHOULD NOT BE USED AS SINGLE DRUGS FOR TREATMENT OF STAPH INFECTIONS.   Report Status 08/23/2012 FINAL   Final   Organism ID, Bacteria STAPHYLOCOCCUS SPECIES (COAGULASE NEGATIVE)   Final  MRSA PCR SCREENING     Status: None   Collection Time    08/21/12  8:07 PM      Result Value Range Status   MRSA by PCR NEGATIVE  NEGATIVE Final   Comment:            The GeneXpert MRSA Assay (FDA     approved for NASAL specimens     only), is one component of a     comprehensive MRSA colonization     surveillance program. It is not     intended to diagnose MRSA     infection nor to guide or     monitor treatment for     MRSA infections.     Scheduled Meds:  Scheduled Meds: . folic acid  1 mg Oral Daily  . furosemide  80 mg Intravenous BID  . insulin aspart  0-15 Units Subcutaneous TID WC  . insulin glargine  20 Units Subcutaneous QHS  . lactulose  20 g Oral TID  . levETIRAcetam  500 mg Oral TID  . levothyroxine   75 mcg Oral QAC breakfast  . multivitamin with minerals  1 tablet Oral Daily  . nitrofurantoin (macrocrystal-monohydrate)  100 mg Oral Q12H  . pantoprazole  40 mg Oral Daily  . rifaximin  550 mg Oral BID  . spironolactone  200 mg Oral Daily    Time spent on care of this patient:   Christiane Ha  [facility] Office  [phone]  If 7PM-7AM, please contact night-coverage [email or web address] Password [email or web address] 08/23/2012, 2:40 PM   LOS: 2 days

## 2012-08-24 DIAGNOSIS — K7290 Hepatic failure, unspecified without coma: Secondary | ICD-10-CM

## 2012-08-24 DIAGNOSIS — R112 Nausea with vomiting, unspecified: Secondary | ICD-10-CM | POA: Diagnosis not present

## 2012-08-24 DIAGNOSIS — R569 Unspecified convulsions: Secondary | ICD-10-CM

## 2012-08-24 LAB — BASIC METABOLIC PANEL
BUN: 11 mg/dL (ref 6–23)
CO2: 30 mEq/L (ref 19–32)
Chloride: 89 mEq/L — ABNORMAL LOW (ref 96–112)
GFR calc Af Amer: >90 mL/min (ref >90)
Potassium: 4.1 mEq/L (ref 3.5–5.1)

## 2012-08-24 MED ORDER — TOLVAPTAN 15 MG PO TABS
15.0000 mg | ORAL_TABLET | ORAL | Status: DC
  Administered 2012-08-24: 15 mg via ORAL
  Filled 2012-08-24 (×2): qty 1

## 2012-08-24 MED ORDER — PROMETHAZINE HCL 25 MG/ML IJ SOLN
12.5000 mg | Freq: Four times a day (QID) | INTRAMUSCULAR | Status: DC | PRN
  Filled 2012-08-24: qty 1

## 2012-08-24 MED ORDER — DOXYCYCLINE HYCLATE 100 MG IV SOLR
100.0000 mg | Freq: Two times a day (BID) | INTRAVENOUS | Status: DC
  Administered 2012-08-24 – 2012-08-25 (×2): 100 mg via INTRAVENOUS
  Filled 2012-08-24 (×4): qty 100

## 2012-08-24 NOTE — Progress Notes (Signed)
Clinical Social Work Department BRIEF PSYCHOSOCIAL ASSESSMENT 08/24/2012  Patient:  Tony Boyd, Tony Boyd     Account Number:  [id number]     Admit date:  08/21/2012  Clinical Social Worker:  Dennison Bulla  Date/Time:  08/24/2012 01:15 PM  Referred by:  Physician  Date Referred:  08/24/2012 Referred for  SNF Placement   Other Referral:   Interview type:  Patient Other interview type:    PSYCHOSOCIAL DATA Living Status:  FACILITY Admitted from facility:  [facility], [place] Level of care:  Skilled Nursing Facility Primary support name:  [name] Primary support relationship to patient:  CHILD, ADULT Degree of support available:   Adequate    CURRENT CONCERNS Current Concerns  Post-Acute Placement   Other Concerns:    SOCIAL WORK ASSESSMENT / PLAN CSW received referral to assist with DC planning. CSW reviewed chart which stated that patient was admitted from SNF.    CSW met with patient at bedside. CSW introduced myself and explained role. Patient reports he was at [facility] for about 3 days prior to being admitted to the hospital. Patient plans to return to SNF at DC. CSW explained process and patient asked that dtr be called when he is discharging.    CSW completed FL2 and placed on chart for MD signature. CSW will continue to follow to assist with DC back to SNF.   Assessment/plan status:  Psychosocial Support/Ongoing Assessment of Needs Other assessment/ plan:   Information/referral to community resources:   Will return to SNF    PATIENT'S/FAMILY'S RESPONSE TO PLAN OF CARE: Patient alert and oriented. Patient agreeable to assessment and thanked CSW for time.       [employer]EEK[employer] COVERAGE

## 2012-08-24 NOTE — Progress Notes (Signed)
[facility] Progress Note  Marrion Accomando ZOX:[id number] DOB: 1955-07-01 DOA: 08/21/2012 PCP: [name], DO  Brief narrative: 57 y.o. male with a history of end-stage liver disease, hepatitis C, esophageal varices, bipolar disorder, alcohol abuse, type 2 diabetes, and hypothyroidism, who was recently discharged from [facility] on June 19 after an inpatient stay for hepatic encephalopathy. He returned to the emergency department from independent living with hepatic encephalopathy, urinary tract infection, hyponatremia, and pleural effusions. He was unable to give any coherent history due to his encephalopathy. During examination in the emergency department he had a mild seizure. It appeared likely that he had not been taking his medications since discharge. No family members were present to provide history.  Assessment/Plan:  Hepatic encephalopathy Ammonia level 178 at presentation - CT head clear - not encephalopathic today.  suspect etiology was combination of noncompliance + UTI - cont to treat each issue - as per GI "agree with lactulose 3 times daily as well as Xifaxan twice daily, indefinitely"  Nausea/vomiting.  Continues despite zofran.  Try phenergan. Started today. Likely from macrodantin. Worsening hyponatremia could be contributing. Change to doxy (iv until nausea resolves). Give tolvaptan.  Demeclocycline could make n/v worse.  Hyponatremia Chronic, worsening. See above  Seizure disorder with seizure in ED fetlt to be due to medication noncompliance - none further.  Cirrhosis of liver - hepatitis C + EtOH abuse  Cont med tx   UTI (lower urinary tract infection) Culture shows coag-negative staph resistant to Rocephin. See above  DM CBG well controlled at the present time  Hypothyroid TSH was 2.323 on 08/12/2012 - no change in treatment given question of noncompliance  Bipolar D/O Continue usual outpatient meds  Pain in left shoulder X-ray negative  Code  Status: FULL Family Communication: No family present at time of exam Disposition Plan: SNF  Consultants: [facility]  Procedures: EEG 6/26 - severe generalized continuous slowing of cerebral activity, consistent with severe diffuse encephalopathy, consistent with patient's history of acute recurrent hepatic encephalopathy - no seizure activity was recorded  Antibiotics: Ciprofloxacin 6/26>> 6/27 Rocephin 6/27>>6/28 nitrofurantoion 6/28 > 6/29 Doxy 6/29  DVT prophylaxis: SCDs  HPI/Subjective: Nauseated. Per rn, vomited an hour ago Objective: Blood pressure 139/70, pulse 85, temperature 98.1 F (36.7 C), temperature source Oral, resp. rate 18, height 6' (1.829 m), weight 125.1 kg (275 lb 12.7 oz), SpO2 94.00%.  Intake/Output Summary (Last 24 hours) at 08/24/12 1802 Last data filed at 08/24/12 1700  Gross per 24 hour  Intake   2080 ml  Output      1 ml  Net   2079 ml    Exam: General: alert uncomfortable. Clutching emesis basin Lungs: Clear to auscultation bilaterally without wheezes or crackles Cardiovascular: Regular rate and rhythm without murmur gallop or rub normal S1 and S2 Abdomen: Nontender, soft, nontender Extremities: No significant cyanosis, clubbing;  2+ edema bilateral lower extremities  Data Reviewed: Basic Metabolic Panel:  Recent Labs Lab 08/21/12 1229 08/22/12 0740 08/23/12 0535 08/24/12 0555  NA 119* 126* 124* 123*  K 4.9 3.9 4.1 4.1  CL 85* 92* 89* 89*  CO2 29 29 29 30   GLUCOSE 174* 99 108* 153*  BUN 17 14 14 11   CREATININE 0.89 0.85 0.80 0.75  CALCIUM 8.3* 8.3* 7.9* 8.1*   Liver Function Tests:  Recent Labs Lab 08/21/12 1229 08/22/12 0740 08/23/12 0535  AST 112* 113* 139*  ALT 53 51 57*  ALKPHOS 248* 212* 230*  BILITOT 3.4* 3.1* 3.5*  PROT 7.2  6.3 6.8  ALBUMIN 2.2* 2.0* 2.0*    Recent Labs Lab 08/21/12 1230 08/22/12 0740 08/23/12 0535  AMMONIA 178* 54 44   CBC:  Recent Labs Lab 08/21/12 1229 08/21/12 1806  08/22/12 0740 08/23/12 0535  WBC 5.9  --  2.9* 4.8  NEUTROABS 5.1  --   --   --   HGB 11.6* 11.6* 10.9* 11.9*  HCT 31.4* 31.6* 30.3* 33.1*  MCV 99.7  --  102.0* 102.2*  PLT 53*  --  36* 43*   CBG:  Recent Labs Lab 08/23/12 1648 08/23/12 2150 08/24/12 0648 08/24/12 1123 08/24/12 1636  GLUCAP 182* 204* 137* 226* 216*    Recent Results (from the past 240 hour(s))  CULTURE, BLOOD (ROUTINE X 2)     Status: None   Collection Time    08/21/12 12:50 PM      Result Value Range Status   Specimen Description BLOOD RIGHT FEMORAL ARTERY   Final   Special Requests BOTTLES DRAWN AEROBIC ONLY 5CC   Final   Culture  Setup Time 08/21/2012 16:39   Final   Culture     Final   Value:        BLOOD CULTURE RECEIVED NO GROWTH TO DATE CULTURE WILL BE HELD FOR 5 DAYS BEFORE ISSUING A FINAL NEGATIVE REPORT   Report Status PENDING   Incomplete  URINE CULTURE     Status: None   Collection Time    08/21/12  1:43 PM      Result Value Range Status   Specimen Description URINE, CATHETERIZED   Final   Special Requests NONE   Final   Culture  Setup Time 08/21/2012 14:50   Final   Colony Count >=100,000 COLONIES/ML   Final   Culture     Final   Value: STAPHYLOCOCCUS SPECIES (COAGULASE NEGATIVE)     Note: RIFAMPIN AND GENTAMICIN SHOULD NOT BE USED AS SINGLE DRUGS FOR TREATMENT OF STAPH INFECTIONS.   Report Status 08/23/2012 FINAL   Final   Organism ID, Bacteria STAPHYLOCOCCUS SPECIES (COAGULASE NEGATIVE)   Final  MRSA PCR SCREENING     Status: None   Collection Time    08/21/12  8:07 PM      Result Value Range Status   MRSA by PCR NEGATIVE  NEGATIVE Final   Comment:            The GeneXpert MRSA Assay (FDA     approved for NASAL specimens     only), is one component of a     comprehensive MRSA colonization     surveillance program. It is not     intended to diagnose MRSA     infection nor to guide or     monitor treatment for     MRSA infections.     Scheduled Meds:  Scheduled Meds: .  doxycycline (VIBRAMYCIN) IV  100 mg Intravenous Q12H  . folic acid  1 mg Oral Daily  . furosemide  80 mg Oral BID  . insulin aspart  0-15 Units Subcutaneous TID WC  . insulin glargine  20 Units Subcutaneous QHS  . lactulose  20 g Oral TID  . levETIRAcetam  500 mg Oral TID  . levothyroxine  75 mcg Oral QAC breakfast  . pantoprazole  40 mg Oral Daily  . rifaximin  550 mg Oral BID  . spironolactone  200 mg Oral Daily  . tolvaptan  15 mg Oral Q24H    Time spent on care of this patient:  Christiane Ha  [facility] Office  [phone]  If 7PM-7AM, please contact night-coverage [email or web address] Password [email or web address] 08/24/2012, 6:02 PM   LOS: 3 days

## 2012-08-25 DIAGNOSIS — E039 Hypothyroidism, unspecified: Secondary | ICD-10-CM

## 2012-08-25 DIAGNOSIS — E119 Type 2 diabetes mellitus without complications: Secondary | ICD-10-CM

## 2012-08-25 LAB — URINALYSIS, ROUTINE W REFLEX MICROSCOPIC
Bilirubin Urine: NEGATIVE
Hgb urine dipstick: NEGATIVE
Ketones, ur: NEGATIVE mg/dL
Nitrite: NEGATIVE
Protein, ur: NEGATIVE mg/dL
Specific Gravity, Urine: 1.005 (ref 1.005–1.030)
Urobilinogen, UA: 0.2 mg/dL (ref 0.0–1.0)

## 2012-08-25 LAB — SODIUM
Sodium: 116 mEq/L — CL (ref 135–145)
Sodium: 122 mEq/L — ABNORMAL LOW (ref 135–145)

## 2012-08-25 LAB — GLUCOSE, CAPILLARY: Glucose-Capillary: 172 mg/dL — ABNORMAL HIGH (ref 70–99)

## 2012-08-25 LAB — AMMONIA: Ammonia: 32 umol/L (ref 11–60)

## 2012-08-25 LAB — CBC
Hemoglobin: 12.5 g/dL — ABNORMAL LOW (ref 13.0–17.0)
Platelets: 42 10*3/uL — ABNORMAL LOW (ref 150–400)
RBC: 3.39 MIL/uL — ABNORMAL LOW (ref 4.22–5.81)
WBC: 6.1 10*3/uL (ref 4.0–10.5)

## 2012-08-25 LAB — MAGNESIUM: Magnesium: 1.5 mg/dL (ref 1.5–2.5)

## 2012-08-25 MED ORDER — DIAZEPAM 5 MG/ML IJ SOLN
2.5000 mg | Freq: Four times a day (QID) | INTRAMUSCULAR | Status: DC | PRN
  Administered 2012-08-25: 2.5 mg via INTRAVENOUS

## 2012-08-25 MED ORDER — LACTULOSE 10 GM/15ML PO SOLN
20.0000 g | Freq: Two times a day (BID) | ORAL | Status: DC
  Administered 2012-08-25 – 2012-08-26 (×4): 20 g via ORAL
  Filled 2012-08-25 (×6): qty 30

## 2012-08-25 MED ORDER — DIAZEPAM 5 MG/ML IJ SOLN
INTRAMUSCULAR | Status: AC
  Filled 2012-08-25: qty 2

## 2012-08-25 MED ORDER — DEMECLOCYCLINE HCL 150 MG PO TABS
300.0000 mg | ORAL_TABLET | Freq: Two times a day (BID) | ORAL | Status: DC
  Administered 2012-08-25 – 2012-08-26 (×2): 300 mg via ORAL
  Filled 2012-08-25 (×4): qty 2

## 2012-08-25 MED ORDER — LEVETIRACETAM 500 MG PO TABS
1000.0000 mg | ORAL_TABLET | Freq: Three times a day (TID) | ORAL | Status: DC
  Administered 2012-08-25: 1000 mg via ORAL
  Filled 2012-08-25 (×4): qty 2

## 2012-08-25 MED ORDER — TOLVAPTAN 15 MG PO TABS
15.0000 mg | ORAL_TABLET | ORAL | Status: DC
  Administered 2012-08-25: 15 mg via ORAL
  Filled 2012-08-25 (×2): qty 1

## 2012-08-25 MED ORDER — DOXYCYCLINE HYCLATE 100 MG PO TABS
100.0000 mg | ORAL_TABLET | Freq: Two times a day (BID) | ORAL | Status: DC
  Administered 2012-08-25 – 2012-08-26 (×3): 100 mg via ORAL
  Filled 2012-08-25 (×6): qty 1

## 2012-08-25 MED ORDER — SODIUM CHLORIDE 0.9 % IV SOLN
1000.0000 mg | Freq: Once | INTRAVENOUS | Status: AC
  Administered 2012-08-25: 1000 mg via INTRAVENOUS
  Filled 2012-08-25: qty 10

## 2012-08-25 NOTE — Progress Notes (Signed)
Inpatient Diabetes Program Recommendations  AACE/ADA: New Consensus Statement on Inpatient Glycemic Control (2013)  Target Ranges:  Prepandial:   less than 140 mg/dL      Peak postprandial:   less than 180 mg/dL (1-2 hours)      Critically ill patients:  140 - 180 mg/dL   Results for ZAY, YEARGAN (MRN [id number]) as of 08/25/2012 11:39  Ref. Range 08/24/2012 06:48 08/24/2012 11:23 08/24/2012 16:36 08/24/2012 20:52 08/25/2012 06:30 08/25/2012 11:24  Glucose-Capillary Latest Range: 70-99 mg/dL 409 (H) 811 (H) 914 (H) 172 (H) 166 (H) 167 (H)    Inpatient Diabetes Program Recommendations Correction (SSI): Please consider increasing Novolog correction to resistant scale and adding Novolog bedtime correction.  Note: Patient has a history of diabetes and takes Lantus 60 units Q10pm and Novolog 5 units TID (if blood glucose is >150 mg/dl) at home for diabetes management.  Currently, patient is ordered to receive Lantus 20 units QHS and Novolog 0-15 units AC for inpatient glycemic control.  Blood glucose over the past 30 hours has ranged from 137-226 mg/dl.  Please consider increasing Novolog correction to resistant scale and add Novolog bedtime correction to improve inpatient glycemic control.  Will continue to follow.  Thanks, Orlando Penner, RN, MSN, CCRN Diabetes Coordinator Inpatient Diabetes Program [phone]

## 2012-08-25 NOTE — Significant Event (Signed)
CRITICAL VALUE ALERT  Critical value received: Na 116  Date of notification:  08/25/2012  Time of notification:  07:04  Critical value read back: yes  Nurse who received alert:  Alfonso Ellis RN  MD notified (1st page): [place]  Time of first page:  07:09  MD notified (2nd page):  Time of second page:  Responding MD:N/A  Time MD responded:

## 2012-08-25 NOTE — Progress Notes (Addendum)
Had another seizure. Patient examined. Postictal. Sodium pending.  Received iv valium.  Transfer to SDU. D/w Dr. Roseanne Reno who will consult. He recommends IV keppra 1000 mg now.  CBG and vitals ok.  Earlier in the week, Patient denied EtOH for over a month and had no seizure or evidence of alcohol withdrawal since admission.  Recheck at 7. Turned on TV and asked me to "change to channel 23, TBS".

## 2012-08-25 NOTE — Consult Note (Signed)
Neurology Consultation Reason for Consult: Seizures Referring Physician: Lendell Caprice, C  CC: Seizures  History is obtained from: Patient  HPI: Tony Boyd is a 57 y.o. male with a history of seizures who presented with hepatic encephalopathy and had several seizures today. The patient states that he felt similar to his regular seizures. He did feel confused afterwards. He states that they all occurred shortly after standing up.  He is hyponatremic.   ROS: A 14 point ROS was performed and is negative except as noted in the HPI.  Past Medical History  Diagnosis Date  . Cirrhosis of liver   . Hepatitis C   . ETOH abuse   . Hypothyroid   . Psychosis   . Bipolar disorder   . Seizures   . Arthritis   . Back pain   . Coagulopathy     Hx of  . Thrombocytopenia     Hx of  . Pancytopenia     Hx of  . H/O hypokalemia   . Hyponatremia     Hx of  . Korsakoff psychosis   . H/O abdominal abscess   . H/O esophageal varices   . Metabolic encephalopathy   . Hepatic encephalopathy   . Urinary urgency   . H/O renal failure   . H/O ascites   . Altered mental status   . Anemia   . Ascites   . Shortness of breath   . Peripheral vascular disease   . GERD (gastroesophageal reflux disease)   . Thrombocytopenia, acquired 06/02/2012  . Unspecified hypothyroidism 06/02/2012  . Pain in joint, pelvic region and thigh   . Edema   . Obesity, unspecified   . Chest pain, unspecified   . Pneumonitis due to inhalation of food or vomitus   . Other convulsions   . Cellulitis and abscess of upper arm and forearm   . Chronic hepatitis C without mention of hepatic coma   . Hypopotassemia   . Anemia, unspecified   . Other and unspecified coagulation defects   . Thrombocytopenia, unspecified   . Bipolar I disorder, most recent episode (or current) unspecified   . Unspecified psychosis   . Encephalopathy   . Esophageal varices without mention of bleeding   . Esophagitis, unspecified   . Abscess of  liver(572.0)   . Chronic kidney disease, unspecified   . Lumbago   . Personal history of alcoholism   . Personal history of tobacco use, presenting hazards to health   . Cirrhosis of liver without mention of alcohol   . Pneumonitis due to inhalation of food or vomitus 09/19/2010  . Chronic hepatitis C without mention of hepatic coma   . Bipolar I disorder, most recent episode (or current) unspecified   . Unspecified psychosis   . Other ascites   . Personal history of tobacco use, presenting hazards to health   . Cirrhosis of liver without mention of alcohol   . Type 2 diabetes mellitus with diabetic neuropathy     Family History: Mother heart disease  Social History: Tob: former smoker  Exam: Current vital signs: BP 137/63  Pulse 93  Temp(Src) 97.9 F (36.6 C) (Oral)  Resp 20  Ht 6' (1.829 m)  Wt 125.1 kg (275 lb 12.7 oz)  BMI 37.4 kg/m2  SpO2 98% Vital signs in last 24 hours: Temp:  [97.5 F (36.4 C)-97.9 F (36.6 C)] 97.9 F (36.6 C) (06/30 1435) Pulse Rate:  [84-93] 93 (06/30 1743) Resp:  [18-20] 20 (06/30 1743) BP: (112-155)/(54-68)  137/63 mmHg (06/30 1743) SpO2:  [96 %-98 %] 98 % (06/30 1743)  General: in bed, NAD CV: RRR Mental Status: Patient is awake, alert, oriented to person, place, month, year, and situation. He has a mild increase in latency of response, but answers all questions appropriately Patient is able to give a clear and coherent history. No signs of aphasia or neglect Cranial Nerves: II: Visual Fields are full. Pupils are equal, round, and reactive to light.  Discs are difficult to visualize. III,IV, VI: EOMI without ptosis or diploplia.  V: Facial sensation is symmetric to temperature VII: Facial movement is symmetric.  VIII: hearing is intact to voice X: Uvula elevates symmetrically XI: Shoulder shrug is symmetric. XII: tongue is midline without atrophy or fasciculations.  Motor: Tone is normal. Bulk is normal. 5/5 strength was present  in all four extremities.  Sensory: Sensation is symmetric to light touch and temperature in the arms and legs. Deep Tendon Reflexes: 2+ and symmetric in the biceps and patellae.  Plantars: Toes are downgoing bilaterally.  Cerebellar: FNF and HKS are intact bilaterally Gait: Not tested due to patient safety concerns  I have reviewed labs in epic and the results pertinent to this consultation are: Na 122 Ca 8.1, but recent albumin of 2.0  I have reviewed the images obtained:CT head 6/26 - no acute findings  Impression: 57 yo M with seizure disorder and breakthrough seizures in teh setting of hyponatremia. With his report of the mall happening after standing up, convulsive syncope secondary to orthostasis would be in the differential but with a history of seizures and his description of these as is typical seizures, I feel this is less likely. Would favor increasing his Keppra dose  Recommendations: 1) Keppra one time dose of 1 g 2) increase dose to 2 g twice a day first dose tonight 3) will check UA for infectious reason for lower seizure threshold 4) also will check magnesium   Ritta Slot, MD [facility] [phone]  If 7pm- 7am, please page neurology on call at [phone].

## 2012-08-25 NOTE — Progress Notes (Signed)
Checked in with patient's nurse earlier. Nausea had resolved. Unfortunately, patient had another seizure which lasted about 5 minutes. Will check a stat serum sodium. Continue Keppra. If sodium lower, may require hypertonic saline. Should patient have another seizure despite improving sodium, will consult neurology.

## 2012-08-25 NOTE — Progress Notes (Signed)
Physical Therapy Treatment Patient Details Name: Tony Boyd MRN: [id number] DOB: 05/20/1955 Today's Date: 08/25/2012 Time: 4098-1191 PT Time Calculation (min): 19 min  PT Assessment / Plan / Recommendation  PT Comments   Spoke with RN prior to session, reports pt stable but to proceed with caution. Pt ambulating with improved steadiness and distance with use of RW. Continues to be significantly limited with mobility however. Feel pt most limited by impaired cognition as opposed to overall physical strength.   Follow Up Recommendations  SNF     Does the patient have the potential to tolerate intense rehabilitation   no     Equipment Recommendations  None recommended by PT       Frequency Min 3X/week   Progress towards PT Goals Progress towards PT goals: Progressing toward goals     Precautions / Restrictions Precautions Precautions: Fall;Shoulder Type of Shoulder Precautions: Pt reports receiveing sling for LUE during previous admission. does not have sling with him currently.   Pertinent Vitals/Pain 6/10 Lt shoulder, positioned with pillows in chair. RN made aware    Mobility  Bed Mobility Bed Mobility: Supine to Sit;Sitting - Scoot to Edge of Bed Supine to Sit: 4: Min guard;With rails;HOB elevated Sitting - Scoot to Edge of Bed: 4: Min guard Details for Bed Mobility Assistance: min guard for safety Transfers Transfers: Sit to Stand;Stand to Sit Sit to Stand: From bed;4: Min assist;With upper extremity assist Stand to Sit: 3: Mod assist;To chair/3-in-1 Details for Transfer Assistance: Pt following one step commands inconsistently. Cues needed for safety awareness.  Ambulation/Gait Ambulation/Gait Assistance: 1: +2 Total assist Ambulation/Gait: Patient Percentage: 70% Ambulation Distance (Feet): 13 Feet Assistive device: Rolling walker Ambulation/Gait Assistance Details: +2 for safety as pt weak and demonstrating decreased stability Gait Pattern: Step-through  pattern;Decreased stride length Stairs: No Wheelchair Mobility Wheelchair Mobility: No    Exercises General Exercises - Lower Extremity Ankle Circles/Pumps: AROM;Both;20 reps;Seated Long Arc Quad: AROM;Both;20 reps;Seated Hip Flexion/Marching: AROM;Both;5 reps;Seated    PT Goals (current goals can now be found in the care plan section) Acute Rehab PT Goals Patient Stated Goal: Get stronger  Visit Information  Last PT Received On: 08/25/12 Assistance Needed: +2 helpful    Subjective Data  Patient Stated Goal: Get stronger   Cognition  Cognition Arousal/Alertness: Awake/alert Behavior During Therapy: WFL for tasks assessed/performed Overall Cognitive Status: Impaired/Different from baseline Area of Impairment: Orientation;Attention;Awareness;Problem solving Orientation Level: Disoriented to;Time Current Attention Level: Sustained Memory: Decreased short-term memory Awareness: Intellectual Problem Solving: Slow processing;Requires verbal cues       End of Session PT - End of Session Equipment Utilized During Treatment: Gait belt Activity Tolerance: Patient tolerated treatment well Patient left: in chair;with call bell/phone within reach Nurse Communication: Mobility status   GP     Wilhemina Bonito 08/25/2012, 12:28 PM

## 2012-08-25 NOTE — Progress Notes (Signed)
Patient had tonic clonic seizure lasting approximately 5 minutes.  Dr. Lendell Caprice paged and orders placed.  Vitals entered in chart.  Will continue to monitor.  Lance Bosch, RN

## 2012-08-25 NOTE — Progress Notes (Signed)
Per Dr. Lendell Caprice, give Samsca stat instead of at 1900.  Mariam Dollar

## 2012-08-25 NOTE — Progress Notes (Signed)
[facility] Progress Note  Tony Boyd ZOX:[id number] DOB: 1955/06/24 DOA: 08/21/2012 PCP: [name], DO  Brief narrative: 57 y.o. male with a history of end-stage liver disease, hepatitis C, esophageal varices, bipolar disorder, alcohol abuse, type 2 diabetes, and hypothyroidism, who was recently discharged from [facility] on June 19 after an inpatient stay for hepatic encephalopathy. He returned to the emergency department from independent living with hepatic encephalopathy, urinary tract infection, hyponatremia, and pleural effusions. He was unable to give any coherent history due to his encephalopathy. During examination in the emergency department he had a mild seizure. It appeared likely that he had not been taking his medications since discharge. No family members were present to provide history.  Assessment/Plan:  Hepatic encephalopathy Improved. Ammonia level 32 today.  Nausea/vomiting.  Slightly improved today. Suspect secondary to nitrofurantoin.  Hyponatremia Chronic, worsening. Now that patient's nausea has improved, will try demeclocycline. Resume fluid restriction. Check cortisol tomorrow.  Seizure disorder with seizure in ED fetlt to be due to medication noncompliance - none further.  Cirrhosis of liver - hepatitis C + EtOH abuse  Cont med tx   UTI (lower urinary tract infection) Culture shows coag-negative staph resistant changed to oral doxycycline  DM CBG well controlled at the present time  Hypothyroid TSH was 2.323 on 08/12/2012 - no change in treatment given question of noncompliance  Bipolar D/O Continue usual outpatient meds  Pain in left shoulder X-ray negative  Code Status: FULL Family Communication: No family present at time of exam Disposition Plan: SNF  Consultants: [facility]  Procedures: EEG 6/26 - severe generalized continuous slowing of cerebral activity, consistent with severe diffuse encephalopathy, consistent with  patient's history of acute recurrent hepatic encephalopathy - no seizure activity was recorded  Antibiotics: Ciprofloxacin 6/26>> 6/27 Rocephin 6/27>>6/28 nitrofurantoion 6/28 > 6/29 Doxy 6/29  DVT prophylaxis: SCDs  HPI/Subjective: Nausea improved but not yet resolved.  Objective: Blood pressure 133/68, pulse 85, temperature 97.8 F (36.6 C), temperature source Oral, resp. rate 18, height 6' (1.829 m), weight 125.1 kg (275 lb 12.7 oz), SpO2 96.00%.  Intake/Output Summary (Last 24 hours) at 08/25/12 0841 Last data filed at 08/25/12 0500  Gross per 24 hour  Intake   1720 ml  Output   3604 ml  Net  -1884 ml    Exam: General: alert oriented and appropriate. Slightly more comfortable appearing. Lungs: Clear to auscultation bilaterally without wheezes or crackles Cardiovascular: Regular rate and rhythm without murmur gallop or rub normal S1 and S2 Abdomen: Nontender, soft, nontender, protruberant Extremities: No significant cyanosis, clubbing;  1+ pitting edema  Data Reviewed: Basic Metabolic Panel:  Recent Labs Lab 08/21/12 1229 08/22/12 0740 08/23/12 0535 08/24/12 0555 08/25/12 0600  NA 119* 126* 124* 123* 116*  K 4.9 3.9 4.1 4.1  --   CL 85* 92* 89* 89*  --   CO2 29 29 29 30   --   GLUCOSE 174* 99 108* 153*  --   BUN 17 14 14 11   --   CREATININE 0.89 0.85 0.80 0.75  --   CALCIUM 8.3* 8.3* 7.9* 8.1*  --    Liver Function Tests:  Recent Labs Lab 08/21/12 1229 08/22/12 0740 08/23/12 0535  AST 112* 113* 139*  ALT 53 51 57*  ALKPHOS 248* 212* 230*  BILITOT 3.4* 3.1* 3.5*  PROT 7.2 6.3 6.8  ALBUMIN 2.2* 2.0* 2.0*    Recent Labs Lab 08/21/12 1230 08/22/12 0740 08/23/12 0535 08/25/12 0600  AMMONIA 178* 54 44 32  CBC:  Recent Labs Lab 08/21/12 1229 08/21/12 1806 08/22/12 0740 08/23/12 0535 08/25/12 0600  WBC 5.9  --  2.9* 4.8 6.1  NEUTROABS 5.1  --   --   --   --   HGB 11.6* 11.6* 10.9* 11.9* 12.5*  HCT 31.4* 31.6* 30.3* 33.1* 33.2*  MCV  99.7  --  102.0* 102.2* 97.9  PLT 53*  --  36* 43* 42*   CBG:  Recent Labs Lab 08/24/12 0648 08/24/12 1123 08/24/12 1636 08/24/12 2052 08/25/12 0630  GLUCAP 137* 226* 216* 172* 166*    Recent Results (from the past 240 hour(s))  CULTURE, BLOOD (ROUTINE X 2)     Status: None   Collection Time    08/21/12 12:50 PM      Result Value Range Status   Specimen Description BLOOD RIGHT FEMORAL ARTERY   Final   Special Requests BOTTLES DRAWN AEROBIC ONLY 5CC   Final   Culture  Setup Time 08/21/2012 16:39   Final   Culture     Final   Value:        BLOOD CULTURE RECEIVED NO GROWTH TO DATE CULTURE WILL BE HELD FOR 5 DAYS BEFORE ISSUING A FINAL NEGATIVE REPORT   Report Status PENDING   Incomplete  URINE CULTURE     Status: None   Collection Time    08/21/12  1:43 PM      Result Value Range Status   Specimen Description URINE, CATHETERIZED   Final   Special Requests NONE   Final   Culture  Setup Time 08/21/2012 14:50   Final   Colony Count >=100,000 COLONIES/ML   Final   Culture     Final   Value: STAPHYLOCOCCUS SPECIES (COAGULASE NEGATIVE)     Note: RIFAMPIN AND GENTAMICIN SHOULD NOT BE USED AS SINGLE DRUGS FOR TREATMENT OF STAPH INFECTIONS.   Report Status 08/23/2012 FINAL   Final   Organism ID, Bacteria STAPHYLOCOCCUS SPECIES (COAGULASE NEGATIVE)   Final  MRSA PCR SCREENING     Status: None   Collection Time    08/21/12  8:07 PM      Result Value Range Status   MRSA by PCR NEGATIVE  NEGATIVE Final   Comment:            The GeneXpert MRSA Assay (FDA     approved for NASAL specimens     only), is one component of a     comprehensive MRSA colonization     surveillance program. It is not     intended to diagnose MRSA     infection nor to guide or     monitor treatment for     MRSA infections.     Scheduled Meds:  Scheduled Meds: . doxycycline (VIBRAMYCIN) IV  100 mg Intravenous Q12H  . furosemide  80 mg Oral BID  . insulin aspart  0-15 Units Subcutaneous TID WC  .  insulin glargine  20 Units Subcutaneous QHS  . lactulose  20 g Oral BID  . levETIRAcetam  500 mg Oral TID  . levothyroxine  75 mcg Oral QAC breakfast  . pantoprazole  40 mg Oral Daily  . rifaximin  550 mg Oral BID  . spironolactone  200 mg Oral Daily  . tolvaptan  15 mg Oral Q24H    Time spent on care of this patient:   Christiane Ha  [facility] Office  [phone]  If 7PM-7AM, please contact night-coverage [email or web address] Password [email or web address] 08/25/2012, 8:41 AM   LOS:  4 days

## 2012-08-25 NOTE — Progress Notes (Signed)
Patient had another seizure.  I came into the room and the patient was actively seizing.   Dr. Lendell Caprice paged and is at the bedside.  Patient is transferring to 3313. Report called to [place], Charity fundraiser. Lance Bosch, RN

## 2012-08-26 DIAGNOSIS — E871 Hypo-osmolality and hyponatremia: Secondary | ICD-10-CM

## 2012-08-26 LAB — BASIC METABOLIC PANEL
BUN: 15 mg/dL (ref 6–23)
CO2: 25 mEq/L (ref 19–32)
Calcium: 8.2 mg/dL — ABNORMAL LOW (ref 8.4–10.5)
Glucose, Bld: 116 mg/dL — ABNORMAL HIGH (ref 70–99)
Sodium: 122 mEq/L — ABNORMAL LOW (ref 135–145)

## 2012-08-26 LAB — COMPREHENSIVE METABOLIC PANEL
AST: 111 U/L — ABNORMAL HIGH (ref 0–37)
Alkaline Phosphatase: 191 U/L — ABNORMAL HIGH (ref 39–117)
CO2: 26 mEq/L (ref 19–32)
Chloride: 89 mEq/L — ABNORMAL LOW (ref 96–112)
Creatinine, Ser: 0.84 mg/dL (ref 0.50–1.35)
GFR calc non Af Amer: >90 mL/min (ref >90)
Potassium: 3.9 mEq/L (ref 3.5–5.1)
Total Bilirubin: 4.4 mg/dL — ABNORMAL HIGH (ref 0.3–1.2)

## 2012-08-26 LAB — CBC
MCH: 36.8 pg — ABNORMAL HIGH (ref 26.0–34.0)
MCHC: 36.9 g/dL — ABNORMAL HIGH (ref 30.0–36.0)
Platelets: 44 10*3/uL — ABNORMAL LOW (ref 150–400)
RBC: 3.07 MIL/uL — ABNORMAL LOW (ref 4.22–5.81)
RDW: 14.5 % (ref 11.5–15.5)

## 2012-08-26 MED ORDER — LEVETIRACETAM 500 MG PO TABS
1000.0000 mg | ORAL_TABLET | Freq: Two times a day (BID) | ORAL | Status: DC
  Administered 2012-08-26 (×2): 1000 mg via ORAL
  Filled 2012-08-26 (×2): qty 2

## 2012-08-26 MED ORDER — TOLVAPTAN 15 MG PO TABS
15.0000 mg | ORAL_TABLET | ORAL | Status: DC
  Administered 2012-08-26: 15 mg via ORAL
  Filled 2012-08-26 (×2): qty 1

## 2012-08-26 NOTE — Progress Notes (Signed)
Occupational Therapy Treatment Patient Details Name: Tony Boyd MRN: [id number] DOB: 28-Jul-1955 Today's Date: 08/26/2012 Time: 1030-1055 OT Time Calculation (min): 25 min  OT Assessment / Plan / Recommendation  OT comments  Pt is currently min to min guard assist for mobility.  Still demonstrates significant cognitive issues.  He was not oriented to place, situation, or time.  He was not able to state if someone stays with him at his home or not stated "I don't remember."  Pt needing max assist to sequence through simple grooming tasks.  Will need SNF for follow-up.  Follow Up Recommendations  SNF       Equipment Recommendations  3 in 1 bedside comode       Frequency Min 2X/week   Progress towards OT Goals Progress towards OT goals: Progressing toward goals;Goals updated - see care plan  Plan Discharge plan remains appropriate    Precautions / Restrictions Precautions Precautions: Fall;Shoulder Restrictions Weight Bearing Restrictions: No   Pertinent Vitals/Pain O2 sats 93-96% on room air,     ADL  Grooming: Performed;Minimal assistance Where Assessed - Grooming: Supported standing Toilet Transfer: Performed;Min guard Statistician Method: Other (comment) (ambulating with RW) Acupuncturist: Bedside commode Toileting - Clothing Manipulation and Hygiene: Simulated;Minimal assistance Where Assessed - Engineer, mining and Hygiene: Sit to stand from 3-in-1 or toilet Equipment Used: Gait belt;Rolling walker Transfers/Ambulation Related to ADLs: Pt is able to perform functional transfers with min guard assist using the RW.  Needs min instructional cueing to use walker correctly as well. ADL Comments: Pt maintains flat affect and head at midline with all movements.  Pt instructed on standing at the sink to brush his teeth.  When he walked over to the sink he proceeded to wash his face, which he had just done earlier with a washcloth on the edge of the bed.   Needed max demonstrational cueing to sequence setup of toothbrush.  Pt placed toothpast on his finger and began brushing his teeth with his finger initially.       OT Goals(current goals can now be found in the care plan section) Acute Rehab OT Goals Time For Goal Achievement: 09/09/12 Potential to Achieve Goals: Good ADL Goals Pt Will Perform Grooming: with supervision;standing (Min instructional cueing to sequence) Pt Will Perform Lower Body Bathing: with supervision;sit to/from stand Pt Will Perform Lower Body Dressing: with supervision;sit to/from stand Pt Will Perform Toileting - Clothing Manipulation and hygiene: with supervision;sit to/from stand  Visit Information  Last OT Received On: 08/26/12 Assistance Needed: +1 PT/OT Co-Evaluation/Treatment: Yes History of Present Illness: Tony Boyd is an 57 y.o. male with hepatic failure presenting with recurrent hepatic encephalopathy with seizure activity.          Cognition  Cognition Arousal/Alertness: Awake/alert Behavior During Therapy: Flat affect Overall Cognitive Status: Impaired/Different from baseline Area of Impairment: Orientation;Attention;Awareness;Problem solving Orientation Level: Disoriented to;Situation;Time Current Attention Level: Sustained Memory: Decreased short-term memory Problem Solving: Slow processing;Difficulty sequencing;Requires tactile cues;Requires verbal cues    Mobility  Bed Mobility Bed Mobility: Supine to Sit;Sitting - Scoot to Edge of Bed Supine to Sit: 4: Min assist;HOB flat Sitting - Scoot to [place] of Bed: 5: Supervision Details for Bed Mobility Assistance: assist to lift trunk Transfers Transfers: Sit to Stand;Stand to Sit Sit to Stand: 4: Min assist;From bed Stand to Sit: 4: Min assist;To chair/3-in-1;With upper extremity assist Details for Transfer Assistance: min assist for safety, pt pulling up on walker, cues for safety with stand to sit for hand  placement       Balance  Balance Balance Assessed: Yes Static Standing Balance Static Standing - Balance Support: No upper extremity supported Static Standing - Level of Assistance: 5: Stand by assistance Dynamic Standing Balance Dynamic Standing - Balance Support: No upper extremity supported Dynamic Standing - Level of Assistance: 4: Min assist Dynamic Standing - Comments: Pt stood for brushing his teeth.   End of Session OT - End of Session Equipment Utilized During Treatment: Gait belt Activity Tolerance: Patient tolerated treatment well Patient left: in chair;with call bell/phone within reach Nurse Communication: Mobility status     Durene Dodge OTR/L Pager number [phone] 08/26/2012, 12:38 PM

## 2012-08-26 NOTE — Progress Notes (Signed)
NEURO HOSPITALIST PROGRESS NOTE   SUBJECTIVE:                                                                                                                        Patient is awake and oriented able to follow all commands. Stating "I feel weird"   OBJECTIVE:                                                                                                                           Vital signs in last 24 hours: Temp:  [97.9 F (36.6 C)-98.5 F (36.9 C)] 98.4 F (36.9 C) (07/01 0700) Pulse Rate:  [75-94] 75 (07/01 0812) Resp:  [11-20] 12 (07/01 0812) BP: (112-137)/(54-66) 120/62 mmHg (07/01 0812) SpO2:  [96 %-99 %] 96 % (07/01 0812)  Intake/Output from previous day: 06/30 0701 - 07/01 0700 In: 720 [P.O.:720] Out: 4850 [Urine:4850] Intake/Output this shift:   Nutritional status: Renal  Past Medical History  Diagnosis Date  . Cirrhosis of liver   . Hepatitis C   . ETOH abuse   . Hypothyroid   . Psychosis   . Bipolar disorder   . Seizures   . Arthritis   . Back pain   . Coagulopathy     Hx of  . Thrombocytopenia     Hx of  . Pancytopenia     Hx of  . H/O hypokalemia   . Hyponatremia     Hx of  . Korsakoff psychosis   . H/O abdominal abscess   . H/O esophageal varices   . Metabolic encephalopathy   . Hepatic encephalopathy   . Urinary urgency   . H/O renal failure   . H/O ascites   . Altered mental status   . Anemia   . Ascites   . Shortness of breath   . Peripheral vascular disease   . GERD (gastroesophageal reflux disease)   . Thrombocytopenia, acquired 06/02/2012  . Unspecified hypothyroidism 06/02/2012  . Pain in joint, pelvic region and thigh   . Edema   . Obesity, unspecified   . Chest pain, unspecified   . Pneumonitis due to inhalation of food or vomitus   . Other convulsions   . Cellulitis and abscess of upper arm  and forearm   . Chronic hepatitis C without mention of hepatic coma   . Hypopotassemia   . Anemia,  unspecified   . Other and unspecified coagulation defects   . Thrombocytopenia, unspecified   . Bipolar I disorder, most recent episode (or current) unspecified   . Unspecified psychosis   . Encephalopathy   . Esophageal varices without mention of bleeding   . Esophagitis, unspecified   . Abscess of liver(572.0)   . Chronic kidney disease, unspecified   . Lumbago   . Personal history of alcoholism   . Personal history of tobacco use, presenting hazards to health   . Cirrhosis of liver without mention of alcohol   . Pneumonitis due to inhalation of food or vomitus 09/19/2010  . Chronic hepatitis C without mention of hepatic coma   . Bipolar I disorder, most recent episode (or current) unspecified   . Unspecified psychosis   . Other ascites   . Personal history of tobacco use, presenting hazards to health   . Cirrhosis of liver without mention of alcohol   . Type 2 diabetes mellitus with diabetic neuropathy      Neurologic Exam:  Mental Status: Alert, oriented, thought content appropriate.  Speech fluent without evidence of aphasia.  Able to follow 3 step commands without difficulty. Cranial Nerves: II: Visual fields grossly normal, pupils equal, round, reactive to light and accommodation III,IV, VI: ptosis not present, extra-ocular motions intact bilaterally V,VII: face symmetric, facial light touch sensation normal bilaterally VIII: hearing normal bilaterally IX,X: gag reflex present XI: bilateral shoulder shrug XII: midline tongue extension Motor: Moving all extremities antigravity and purposefully Tone and bulk:normal tone throughout; no atrophy noted Sensory: Pinprick and light touch intact throughout, bilaterally Deep Tendon Reflexes:  2+ throughout UE and KJ Plantars: Right: downgoing   Left: downgoing    Lab Results: Lab Results  Component Value Date/Time   CHOL 96 09/14/2010  4:33 PM   Lipid Panel No results found for this basename: CHOL, TRIG, HDL, CHOLHDL,  VLDL, LDLCALC,  in the last 72 hours  Studies/Results: No results found.  MEDICATIONS                                                                                                                        Scheduled: . demeclocycline  300 mg Oral Q12H  . doxycycline  100 mg Oral Q12H  . furosemide  80 mg Oral BID  . insulin aspart  0-15 Units Subcutaneous TID WC  . insulin glargine  20 Units Subcutaneous QHS  . lactulose  20 g Oral BID  . levETIRAcetam  1,000 mg Oral Q12H  . levothyroxine  75 mcg Oral QAC breakfast  . pantoprazole  40 mg Oral Daily  . rifaximin  550 mg Oral BID  . spironolactone  200 mg Oral Daily    ASSESSMENT/PLAN:  57 yo M with seizure disorder and breakthrough seizures in teh setting of hyponatremia. Keppra dose has been increased to 100 mg BID and he has had no further seizures.  Sodium remains low at 121.    Recommend: 1) Continue Keppra at 1000 mg BID PO 2) Continue to treat hyponatremia and underlying metabolic issues  Neurology will S/O   Assessment and plan discussed with with attending physician and they are in agreement.    Felicie Morn PA-C [facility] [phone]  08/26/2012, 9:48 AM

## 2012-08-26 NOTE — Progress Notes (Signed)
Physical Therapy Treatment Patient Details Name: Tony Boyd MRN: [id number] DOB: [birth year] Today's Date: 08/26/2012 Time: 8657-8469 PT Time Calculation (min): 24 min  PT Assessment / Plan / Recommendation  PT Comments   Patient continues to be limited with function due to cognitive deficits needing instructional cues to remember appropriate task and for successful task completion.  Has some balance deficits needing assist with walker especially on turns.  Will need SNF level rehab at D/C.  Follow Up Recommendations  SNF     Does the patient have the potential to tolerate intense rehabilitation   N/A  Barriers to Discharge  N/A      Equipment Recommendations  None recommended by PT    Recommendations for Other Services  None  Frequency Min 3X/week   Progress towards PT Goals Progress towards PT goals: Progressing toward goals  Plan Current plan remains appropriate    Precautions / Restrictions Precautions Precautions: Fall;Shoulder Restrictions Weight Bearing Restrictions: No   Pertinent Vitals/Pain No pain complaints    Mobility  Bed Mobility Supine to Sit: 4: Min assist;HOB flat Sitting - Scoot to Edge of Bed: 5: Supervision Details for Bed Mobility Assistance: assist to lift trunk Transfers Sit to Stand: 4: Min assist;From bed Stand to Sit: 4: Min assist;To chair/3-in-1;With upper extremity assist Details for Transfer Assistance: min assist for safety, pt pulling up on walker, cues for safety with stand to sit for hand placement Ambulation/Gait Ambulation/Gait Assistance: 4: Min guard;4: Min assist Ambulation Distance (Feet): 120 Feet Assistive device: Rolling walker Ambulation/Gait Assistance Details: assist on turns for safety due to imbalance and directional cues during function to use walker due to forgetting  Gait Pattern: Step-through pattern;Decreased stride length;Narrow base of support      PT Goals (current goals can now be found in the care plan  section)    Visit Information  Last PT Received On: 08/26/12 Assistance Needed: +1 PT/OT Co-Evaluation/Treatment: Yes    Subjective Data   c/o fatigue with ambulation   Cognition  Cognition Arousal/Alertness: Awake/alert Behavior During Therapy: WFL for tasks assessed/performed Overall Cognitive Status: Impaired/Different from baseline Area of Impairment: Orientation;Attention;Awareness;Problem solving Orientation Level: Disoriented to;Situation;Time Current Attention Level: Sustained Memory: Decreased short-term memory Problem Solving: Slow processing;Requires verbal cues    Balance  Dynamic Standing Balance Dynamic Standing - Balance Support: No upper extremity supported Dynamic Standing - Level of Assistance: 4: Min assist;5: Stand by assistance Dynamic Standing - Comments: standing to brush teeth with OT cueing for task completion x approx 7 minutes  End of Session PT - End of Session Equipment Utilized During Treatment: Gait belt Activity Tolerance: Patient tolerated treatment well Patient left: in chair;with call bell/phone within reach;with chair alarm set   GP     [facility] 08/26/2012, 11:09 AM Sheran Lawless, PT [phone] 08/26/2012

## 2012-08-26 NOTE — Progress Notes (Signed)
MEDICATION RELATED CONSULT NOTE - FOLLOW UP   Pharmacy Re: Tolvaptan Indication: Sodium Monitoring  Labs: Results for Tony Boyd, Tony Boyd (MRN [id number]) as of 08/26/2012 21:27  Ref. Range 08/26/2012 05:50 08/26/2012 10:31 08/26/2012 20:26  Sodium Latest Range: 134-144 mmol/L 121 (L) 122 (L) 124 (L)    Assessment: 57 yo male started on Tolvaptan for hyponatremia.  His sodium is responding nicely with an upward trend of ~ 66meq/14 hr.  There is no noted complications with his therapy.  Of concern will be whether patient will be compliant on this at discharge especially with the monitoring that needs to be given.  Drug/Disease Adverse Events - Liver Injury - SAMSCA can cause serious and potentially fatal liver injury.  Avoid use in patients with underlying liver disease, including cirrhosis, because the ability to recover may be impaired. Limit duration of therapy with SAMSCA to 30 days  Goal of Therapy:  Gradual increase in sodium Short term use only  Plan:  1.  Continue current dosing 2.  Consider risk/benefit of using this drug with liver disease  Nadara Mustard, PharmD., MS Clinical Pharmacist Pager:  [phone] Thank you for allowing pharmacy to be part of this patients care team. 08/26/2012,9:30 PM

## 2012-08-26 NOTE — Progress Notes (Signed)
Patient was transferred from 4N to 3300.  Written report completed and placed on Cassandra Allen's desk.  V-mail left to return call for verbal report.  Plan return to [facility] when medically stable.  I will sign off.  Lorri Frederick. [place]  [phone]

## 2012-08-26 NOTE — Progress Notes (Signed)
[facility] Team 1 Stepdown Progress Note  Tony Boyd AVW:[id number] DOB: 1956/01/01 DOA: 08/21/2012 PCP: [name], DO  Brief narrative: 57 y.o. male with a history of end-stage liver disease, hepatitis C, esophageal varices, bipolar disorder, alcohol abuse, type 2 diabetes, and hypothyroidism, who was recently discharged from [facility] on June 19 after an inpatient stay for hepatic encephalopathy. He returned to the emergency department from independent living with hepatic encephalopathy, urinary tract infection, hyponatremia, and pleural effusions. He was unable to give any coherent history due to his encephalopathy. During examination in the emergency department he had a mild seizure. It appeared likely that he had not been taking his medications since discharge. No family members were present to provide history.  Assessment/Plan:  Hepatic encephalopathy -Improved although remains confused - Ammonia level 32 (6/30).  Nausea/vomiting -Suspect secondary to nitrofurantoin. -stable without recurrence  Hyponatremia -Chronic and variable.  -demeclocycline started 6/30  -low Na+ seems due to massive volume overload so will try Samsca since better for reduction of free water and dc demeclocycline -cont fluid restriction. -cortisol borderline at 10.2 but pt not hypotensive -cont Lasix and Aldactone  Seizure disorder with seizure in ED -felt to be due to medication noncompliance - none further.  Cirrhosis of liver/ascites - hepatitis C + EtOH abuse  -Cont med tx  -treat volume overload as above  UTI (lower urinary tract infection) -Culture shows coag-negative staph resistant so changed to oral doxycycline  DM -CBG well controlled at the present time  Hypothyroid -TSH was 2.323 on 08/12/2012 - no change in treatment given question of noncompliance  Bipolar D/O -Continue usual outpatient meds -needs AL vs SNF at dc given repeat hospitalizations for non  compliance  Pain in left shoulder -X-ray negative  Code Status: FULL Family Communication: No family present at time of exam Disposition Plan: SNF  Consultants: [facility]  Procedures: EEG 6/26 - severe generalized continuous slowing of cerebral activity, consistent with severe diffuse encephalopathy, consistent with patient's history of acute recurrent hepatic encephalopathy - no seizure activity was recorded  Antibiotics: Ciprofloxacin 6/26>> 6/27 Rocephin 6/27>>6/28 nitrofurantoion 6/28 > 6/29 Doxy 6/29  DVT prophylaxis: SCDs  HPI/Subjective: No specific complaints-flat affect.  Objective: Blood pressure 128/60, pulse 82, temperature 98 F (36.7 C), temperature source Oral, resp. rate 12, height 6' (1.829 m), weight 125.1 kg (275 lb 12.7 oz), SpO2 94.00%.  Intake/Output Summary (Last 24 hours) at 08/26/12 1411 Last data filed at 08/26/12 1242  Gross per 24 hour  Intake    965 ml  Output   2550 ml  Net  -1585 ml    Exam: General: alert oriented and appropriate. Slightly more comfortable appearing. Lungs: Clear to auscultation bilaterally without wheezes or crackles Cardiovascular: Regular rate and rhythm without murmur gallop or rub normal S1 and S2 Abdomen: Nontender, soft, nontender, protuberant with ascites Extremities: No significant cyanosis, clubbing;  2+ pitting edema legs esp at ankles  Data Reviewed: Basic Metabolic Panel:  Recent Labs Lab 08/22/12 0740 08/23/12 0535 08/24/12 0555 08/25/12 0600 08/25/12 1741 08/25/12 2100 08/26/12 0550 08/26/12 1031  NA 126* 124* 123* 116* 122*  --  121* 122*  K 3.9 4.1 4.1  --   --   --  3.9 4.8  CL 92* 89* 89*  --   --   --  89* 88*  CO2 29 29 30   --   --   --  26 25  GLUCOSE 99 108* 153*  --   --   --  123* 116*  BUN 14 14 11   --   --   --  14 15  CREATININE 0.85 0.80 0.75  --   --   --  0.84 0.84  CALCIUM 8.3* 7.9* 8.1*  --   --   --  8.1* 8.2*  MG  --   --   --   --   --  1.5  --   --    Liver  Function Tests:  Recent Labs Lab 08/21/12 1229 08/22/12 0740 08/23/12 0535 08/26/12 0550  AST 112* 113* 139* 111*  ALT 53 51 57* 56*  ALKPHOS 248* 212* 230* 191*  BILITOT 3.4* 3.1* 3.5* 4.4*  PROT 7.2 6.3 6.8 6.3  ALBUMIN 2.2* 2.0* 2.0* 2.0*    Recent Labs Lab 08/21/12 1230 08/22/12 0740 08/23/12 0535 08/25/12 0600  AMMONIA 178* 54 44 32   CBC:  Recent Labs Lab 08/21/12 1229 08/21/12 1806 08/22/12 0740 08/23/12 0535 08/25/12 0600 08/26/12 0550  WBC 5.9  --  2.9* 4.8 6.1 7.6  NEUTROABS 5.1  --   --   --   --   --   HGB 11.6* 11.6* 10.9* 11.9* 12.5* 11.3*  HCT 31.4* 31.6* 30.3* 33.1* 33.2* 30.6*  MCV 99.7  --  102.0* 102.2* 97.9 99.7  PLT 53*  --  36* 43* 42* 44*   CBG:  Recent Labs Lab 08/24/12 2052 08/25/12 0630 08/25/12 1124 08/25/12 1649 08/25/12 2317  GLUCAP 172* 166* 167* 148* 176*    Recent Results (from the past 240 hour(s))  CULTURE, BLOOD (ROUTINE X 2)     Status: None   Collection Time    08/21/12 12:50 PM      Result Value Range Status   Specimen Description BLOOD RIGHT FEMORAL ARTERY   Final   Special Requests BOTTLES DRAWN AEROBIC ONLY 5CC   Final   Culture  Setup Time 08/21/2012 16:39   Final   Culture     Final   Value:        BLOOD CULTURE RECEIVED NO GROWTH TO DATE CULTURE WILL BE HELD FOR 5 DAYS BEFORE ISSUING A FINAL NEGATIVE REPORT   Report Status PENDING   Incomplete  URINE CULTURE     Status: None   Collection Time    08/21/12  1:43 PM      Result Value Range Status   Specimen Description URINE, CATHETERIZED   Final   Special Requests NONE   Final   Culture  Setup Time 08/21/2012 14:50   Final   Colony Count >=100,000 COLONIES/ML   Final   Culture     Final   Value: STAPHYLOCOCCUS SPECIES (COAGULASE NEGATIVE)     Note: RIFAMPIN AND GENTAMICIN SHOULD NOT BE USED AS SINGLE DRUGS FOR TREATMENT OF STAPH INFECTIONS.   Report Status 08/23/2012 FINAL   Final   Organism ID, Bacteria STAPHYLOCOCCUS SPECIES (COAGULASE NEGATIVE)    Final  MRSA PCR SCREENING     Status: None   Collection Time    08/21/12  8:07 PM      Result Value Range Status   MRSA by PCR NEGATIVE  NEGATIVE Final   Comment:            The GeneXpert MRSA Assay (FDA     approved for NASAL specimens     only), is one component of a     comprehensive MRSA colonization     surveillance program. It is not     intended to diagnose MRSA  infection nor to guide or     monitor treatment for     MRSA infections.  MRSA PCR SCREENING     Status: None   Collection Time    08/25/12  8:45 PM      Result Value Range Status   MRSA by PCR NEGATIVE  NEGATIVE Final   Comment:            The GeneXpert MRSA Assay (FDA     approved for NASAL specimens     only), is one component of a     comprehensive MRSA colonization     surveillance program. It is not     intended to diagnose MRSA     infection nor to guide or     monitor treatment for     MRSA infections.     Scheduled Meds:  Scheduled Meds: . doxycycline  100 mg Oral Q12H  . furosemide  80 mg Oral BID  . insulin aspart  0-15 Units Subcutaneous TID WC  . insulin glargine  20 Units Subcutaneous QHS  . lactulose  20 g Oral BID  . levETIRAcetam  1,000 mg Oral Q12H  . levothyroxine  75 mcg Oral QAC breakfast  . pantoprazole  40 mg Oral Daily  . rifaximin  550 mg Oral BID  . spironolactone  200 mg Oral Daily  . tolvaptan  15 mg Oral Q24H    Time spent on care of this patient:   ELLIS,ALLISON L. ANP  [facility] Office  [phone]  If 7PM-7AM, please contact night-coverage [email or web address] Password [email or web address] 08/26/2012, 2:11 PM   LOS: 5 days   I have examined the patient, reviewed the chart and modified the above note which I agree with.   Abijah Roussel,MD [phone] 08/26/2012, 2:47 PM

## 2012-08-27 ENCOUNTER — Other Ambulatory Visit: Payer: Self-pay | Admitting: *Deleted

## 2012-08-27 LAB — CULTURE, BLOOD (ROUTINE X 2): Culture: NO GROWTH

## 2012-08-27 LAB — GLUCOSE, CAPILLARY
Glucose-Capillary: 111 mg/dL — ABNORMAL HIGH (ref 70–99)
Glucose-Capillary: 153 mg/dL — ABNORMAL HIGH (ref 70–99)
Glucose-Capillary: 223 mg/dL — ABNORMAL HIGH (ref 70–99)
Glucose-Capillary: 157 mg/dL — ABNORMAL HIGH (ref 70–99)

## 2012-08-27 LAB — SODIUM: Sodium: 126 mEq/L — ABNORMAL LOW (ref 135–145)

## 2012-08-27 MED ORDER — LACTULOSE 10 GM/15ML PO SOLN: 30.0000 g | Freq: Every day | ORAL | Status: DC

## 2012-08-27 MED ORDER — SODIUM CHLORIDE 0.9 % IV SOLN
1500.0000 mg | Freq: Two times a day (BID) | INTRAVENOUS | Status: DC
  Administered 2012-08-27 (×2): 1500 mg via INTRAVENOUS
  Filled 2012-08-27 (×4): qty 15

## 2012-08-27 MED ORDER — LEVOTHYROXINE SODIUM 100 MCG IV SOLR
37.5000 ug | Freq: Every day | INTRAVENOUS | Status: DC
  Filled 2012-08-27: qty 5

## 2012-08-27 MED ORDER — SPIRONOLACTONE 100 MG PO TABS
200.0000 mg | ORAL_TABLET | Freq: Every day | ORAL | Status: DC
  Administered 2012-08-27 – 2012-08-29 (×3): 200 mg via ORAL
  Filled 2012-08-27 (×3): qty 2

## 2012-08-27 MED ORDER — FUROSEMIDE 10 MG/ML IJ SOLN
80.0000 mg | Freq: Two times a day (BID) | INTRAMUSCULAR | Status: DC
  Administered 2012-08-27 (×2): 80 mg via INTRAVENOUS
  Filled 2012-08-27 (×3): qty 8

## 2012-08-27 MED ORDER — LORAZEPAM 2 MG/ML IJ SOLN
INTRAMUSCULAR | Status: AC
  Administered 2012-08-27: 2 mg
  Filled 2012-08-27: qty 1

## 2012-08-27 MED ORDER — SODIUM CHLORIDE 0.9 % IV SOLN: 500.0000 mg | Freq: Once | INTRAVENOUS | Status: DC

## 2012-08-27 MED ORDER — RIFAXIMIN 550 MG PO TABS
550.0000 mg | ORAL_TABLET | Freq: Two times a day (BID) | ORAL | Status: DC
  Administered 2012-08-27 – 2012-08-29 (×4): 550 mg via ORAL
  Filled 2012-08-27 (×5): qty 1

## 2012-08-27 MED ORDER — OXYCODONE HCL 10 MG PO TABS: 10.0000 mg | ORAL_TABLET | ORAL | Status: DC | PRN

## 2012-08-27 MED ORDER — INSULIN ASPART 100 UNIT/ML ~~LOC~~ SOLN
0.0000 [IU] | SUBCUTANEOUS | Status: DC
  Administered 2012-08-27: 3 [IU] via SUBCUTANEOUS
  Administered 2012-08-27: 2 [IU] via SUBCUTANEOUS

## 2012-08-27 MED ORDER — LEVOTHYROXINE SODIUM 75 MCG PO TABS
75.0000 ug | ORAL_TABLET | Freq: Every day | ORAL | Status: DC
  Administered 2012-08-28 – 2012-08-29 (×2): 75 ug via ORAL
  Filled 2012-08-27 (×3): qty 1

## 2012-08-27 MED ORDER — LACTULOSE 10 GM/15ML PO SOLN
30.0000 g | Freq: Two times a day (BID) | ORAL | Status: DC
  Administered 2012-08-27 – 2012-08-29 (×3): 30 g via ORAL
  Filled 2012-08-27 (×5): qty 45

## 2012-08-27 MED ORDER — FOLIC ACID 1 MG PO TABS
1.0000 mg | ORAL_TABLET | Freq: Every day | ORAL | Status: DC
Start: 2012-08-27 — End: 2012-08-29
  Administered 2012-08-27 – 2012-08-29 (×3): 1 mg via ORAL
  Filled 2012-08-27 (×3): qty 1

## 2012-08-27 MED ORDER — LORAZEPAM 2 MG/ML IJ SOLN: 1.0000 mg | Freq: Once | INTRAMUSCULAR | Status: AC

## 2012-08-27 MED ORDER — INSULIN ASPART 100 UNIT/ML ~~LOC~~ SOLN
0.0000 [IU] | Freq: Three times a day (TID) | SUBCUTANEOUS | Status: DC
  Administered 2012-08-28: 5 [IU] via SUBCUTANEOUS
  Administered 2012-08-28: 2 [IU] via SUBCUTANEOUS
  Administered 2012-08-29 (×2): 3 [IU] via SUBCUTANEOUS

## 2012-08-27 MED ORDER — LORAZEPAM 2 MG/ML IJ SOLN: 1.0000 mg | INTRAMUSCULAR | Status: DC | PRN

## 2012-08-27 NOTE — Progress Notes (Signed)
Subjective: Patient noted to have 3 seizures today.  The last was with the AM infusion of Keppra.  Is currently awake and asking to go home.    Objective: Current vital signs: BP 114/72  Pulse 84  Temp(Src) 98.9 F (37.2 C) (Oral)  Resp 15  Ht 6' (1.829 m)  Wt 125.1 kg (275 lb 12.7 oz)  BMI 37.4 kg/m2  SpO2 92% Vital signs in last 24 hours: Temp:  [98 F (36.7 C)-99.3 F (37.4 C)] 98.9 F (37.2 C) (07/02 1130) Pulse Rate:  [82-88] 84 (07/02 0813) Resp:  [8-22] 15 (07/02 0813) BP: (111-128)/(51-72) 114/72 mmHg (07/02 0719) SpO2:  [87 %-98 %] 92 % (07/02 0813)  Intake/Output from previous day: 07/01 0701 - 07/02 0700 In: 600 [P.O.:600] Out: 600 [Urine:600] Intake/Output this shift: Total I/O In: -  Out: 775 [Urine:775] Nutritional status: NPO  Neurologic Exam: Mental Status:  Lethargic but easily awakened.  Expresses wish to go home.  Speech fluent.  Follows simple commands. Cranial Nerves:  II: Blinks to bilateral confrontation, pupils equal, round, reactive to light and accommodation  III,IV, VI: ptosis not present, extra-ocular motions intact bilaterally  V,VII: face symmetric, facial light touch sensation normal bilaterally  VIII: hearing normal bilaterally  IX,X: gag reflex present  XI: bilateral shoulder shrug  XII: midline tongue extension  Motor:  Moving all extremities antigravity and purposefully  Tone and bulk:normal tone throughout; no atrophy noted  Sensory: Pinprick and light touch intact throughout, bilaterally  Deep Tendon Reflexes:  2+ throughout UE and KJ  Plantars:  Right: downgoing     Left: downgoing    Lab Results: Basic Metabolic Panel:  Recent Labs Lab 08/22/12 0740 08/23/12 0535 08/24/12 0555  08/25/12 1741 08/25/12 2100 08/26/12 0550 08/26/12 1031 08/26/12 2026 08/27/12 0500  NA 126* 124* 123*  < > 122*  --  121* 122* 124* 126*  K 3.9 4.1 4.1  --   --   --  3.9 4.8  --   --   CL 92* 89* 89*  --   --   --  89* 88*  --   --    CO2 29 29 30   --   --   --  26 25  --   --   GLUCOSE 99 108* 153*  --   --   --  123* 116*  --   --   BUN 14 14 11   --   --   --  14 15  --   --   CREATININE 0.85 0.80 0.75  --   --   --  0.84 0.84  --   --   CALCIUM 8.3* 7.9* 8.1*  --   --   --  8.1* 8.2*  --   --   MG  --   --   --   --   --  1.5  --   --   --   --   < > = values in this interval not displayed.  Liver Function Tests:  Recent Labs Lab 08/21/12 1229 08/22/12 0740 08/23/12 0535 08/26/12 0550  AST 112* 113* 139* 111*  ALT 53 51 57* 56*  ALKPHOS 248* 212* 230* 191*  BILITOT 3.4* 3.1* 3.5* 4.4*  PROT 7.2 6.3 6.8 6.3  ALBUMIN 2.2* 2.0* 2.0* 2.0*   No results found for this basename: LIPASE, AMYLASE,  in the last 168 hours  Recent Labs Lab 08/22/12 0740 08/23/12 0535 08/25/12 0600  AMMONIA 54 44 32  CBC:  Recent Labs Lab 08/21/12 1229 08/21/12 1806 08/22/12 0740 08/23/12 0535 08/25/12 0600 08/26/12 0550  WBC 5.9  --  2.9* 4.8 6.1 7.6  NEUTROABS 5.1  --   --   --   --   --   HGB 11.6* 11.6* 10.9* 11.9* 12.5* 11.3*  HCT 31.4* 31.6* 30.3* 33.1* 33.2* 30.6*  MCV 99.7  --  102.0* 102.2* 97.9 99.7  PLT 53*  --  36* 43* 42* 44*    Cardiac Enzymes: No results found for this basename: CKTOTAL, CKMB, CKMBINDEX, TROPONINI,  in the last 168 hours  Lipid Panel: No results found for this basename: CHOL, TRIG, HDL, CHOLHDL, VLDL, LDLCALC,  in the last 168 hours  CBG:  Recent Labs Lab 08/25/12 2317 08/26/12 1222 08/26/12 1652 08/26/12 2144 08/27/12 0720  GLUCAP 176* 111* 153* 223* 116*    Microbiology: Results for orders placed during the hospital encounter of 08/21/12  CULTURE, BLOOD (ROUTINE X 2)     Status: None   Collection Time    08/21/12 12:50 PM      Result Value Range Status   Specimen Description BLOOD RIGHT FEMORAL ARTERY   Final   Special Requests BOTTLES DRAWN AEROBIC ONLY 5CC   Final   Culture  Setup Time 08/21/2012 16:39   Final   Culture NO GROWTH 5 DAYS   Final   Report  Status 08/27/2012 FINAL   Final  URINE CULTURE     Status: None   Collection Time    08/21/12  1:43 PM      Result Value Range Status   Specimen Description URINE, CATHETERIZED   Final   Special Requests NONE   Final   Culture  Setup Time 08/21/2012 14:50   Final   Colony Count >=100,000 COLONIES/ML   Final   Culture     Final   Value: STAPHYLOCOCCUS SPECIES (COAGULASE NEGATIVE)     Note: RIFAMPIN AND GENTAMICIN SHOULD NOT BE USED AS SINGLE DRUGS FOR TREATMENT OF STAPH INFECTIONS.   Report Status 08/23/2012 FINAL   Final   Organism ID, Bacteria STAPHYLOCOCCUS SPECIES (COAGULASE NEGATIVE)   Final  MRSA PCR SCREENING     Status: None   Collection Time    08/21/12  8:07 PM      Result Value Range Status   MRSA by PCR NEGATIVE  NEGATIVE Final   Comment:            The GeneXpert MRSA Assay (FDA     approved for NASAL specimens     only), is one component of a     comprehensive MRSA colonization     surveillance program. It is not     intended to diagnose MRSA     infection nor to guide or     monitor treatment for     MRSA infections.  MRSA PCR SCREENING     Status: None   Collection Time    08/25/12  8:45 PM      Result Value Range Status   MRSA by PCR NEGATIVE  NEGATIVE Final   Comment:            The GeneXpert MRSA Assay (FDA     approved for NASAL specimens     only), is one component of a     comprehensive MRSA colonization     surveillance program. It is not     intended to diagnose MRSA     infection nor to guide or  monitor treatment for     MRSA infections.    Coagulation Studies: No results found for this basename: LABPROT, INR,  in the last 72 hours  Imaging: No results found.  Medications:  I have reviewed the patient's current medications. Scheduled: . furosemide  80 mg Intravenous Q12H  . insulin aspart  0-15 Units Subcutaneous Q4H  . levETIRAcetam  1,500 mg Intravenous Q12H  . levothyroxine  37.5 mcg Intravenous Daily    Assessment/Plan: 57  year old male with continued breakthrough seizures.  On Keppra 1000mg  BID.  Metabolic issues continue.    Recommendations: 1.  Agree with increase in Keppra to 1500mg  Q 12hours.   2.  Continue seizure precautions.     LOS: 6 days    Thana Farr, MD [facility] [phone] 08/27/2012  11:40 AM

## 2012-08-27 NOTE — Progress Notes (Signed)
Pt observered to have aprox 6 seizures lasting various times with the longest being 5-6 mins, pt had flexion of feet, extension of L hand and deviation of head and eyes to left followed by rapid eye blinking and apnea throughout this time, MD at [facility], inventions done, nursing will cont to monitor

## 2012-08-27 NOTE — Progress Notes (Signed)
Physical Therapy Treatment Patient Details Name: Tony Boyd MRN: [id number] DOB: [birth year] Today's Date: 08/27/2012 Time: 4401-0272 PT Time Calculation (min): 33 min  PT Assessment / Plan / Recommendation  PT Comments   Patient more confused attempting to climb out of bed over rails and with stool incontinence this pm.  Feel mobility is improving, however; needs 24 hour assist for safety.  Follow Up Recommendations  SNF           Equipment Recommendations  None recommended by PT          Progress towards PT Goals Progress towards PT goals: Progressing toward goals  Plan Current plan remains appropriate    Precautions / Restrictions Precautions Precautions: Fall;Shoulder   Pertinent Vitals/Pain C/o left shoulder and back pain. Not rated, improved sitting in chair    Mobility  Bed Mobility Supine to Sit: 5: Supervision;With rails;HOB flat Sitting - Scoot to Edge of Bed: 5: Supervision Details for Bed Mobility Assistance: assist for safety due to patient sitting up to get up unaided Transfers Sit to Stand: 5: Supervision;From bed Stand to Sit: 4: Min guard;To chair/3-in-1 Details for Transfer Assistance: pulls up on walker with decreased safety, cues and assist to safely sit in chair Ambulation/Gait Ambulation/Gait Assistance: 4: Min guard Ambulation Distance (Feet): 200 Feet Assistive device: Rolling walker Ambulation/Gait Assistance Details: veers to either side at different times with decreased safety Gait Pattern: Trunk flexed;Decreased stride length;Step-through pattern      PT Goals (current goals can now be found in the care plan section)    Visit Information  Last PT Received On: 08/27/12    Subjective Data   I need to get my pants on and get out in the field.   Cognition  Cognition Arousal/Alertness: Awake/alert Behavior During Therapy: Restless Overall Cognitive Status: Impaired/Different from baseline Area of Impairment:  Orientation;Attention;Awareness;Problem solving Orientation Level: Situation;Disoriented to;Place Current Attention Level: Sustained Memory: Decreased short-term memory Problem Solving: Decreased initiation;Requires verbal cues;Requires tactile cues General Comments: confused thinking he needs to leave to get to baseball game in [place] on the 4th so "he can be a target for [employer]"    Balance  Static Standing Balance Static Standing - Balance Support: Bilateral upper extremity supported Static Standing - Level of Assistance: 5: Stand by assistance Static Standing - Comment/# of Minutes: holding walker while being assisted for hygiene due to incontinent of stool  End of Session PT - End of Session Equipment Utilized During Treatment: Gait belt Activity Tolerance: Patient tolerated treatment well Patient left: in chair;with call bell/phone within reach;with chair alarm set;with nursing/sitter in room   GP     [facility] 08/27/2012, 5:30 PM McCoole, Melvina [phone] 08/27/2012

## 2012-08-27 NOTE — Progress Notes (Signed)
[facility] Team 1 Stepdown Progress Note  Tony Boyd WUJ:[id number] DOB: 1955/11/04 DOA: 08/21/2012 PCP: [name], DO  Brief narrative: 57 y.o. male with a history of end-stage liver disease, hepatitis C, esophageal varices, bipolar disorder, alcohol abuse, type 2 diabetes, and hypothyroidism, who was recently discharged from [facility] on June 19 after an inpatient stay for hepatic encephalopathy. He returned to the emergency department from independent living with hepatic encephalopathy, urinary tract infection, hyponatremia, and pleural effusions. He was unable to give any coherent history due to his encephalopathy. During examination in the emergency department he had a seizure. It appeared likely that he had not been taking his medications since discharge. No family members were present to provide history.  Assessment/Plan:  Hepatic encephalopathy -Ammonia level 32 on 6/30 -recheck in AM  Nausea/vomiting -stable without recurrence  Hyponatremia -Chronic and variable - baseline appears to be around 133 -demeclocycline started 6/30 but was dc'd in favor of Samsca -low Na+ seemed to be due to massive volume overload so Samsca initiated primarily for ACUTE reduction in free water in conjunction with diuretics -Sodium improving and has diuresed almost 10,000 cc since admission so will dc Samsca for now and cont to watch Na closely -cont fluid restriction. -cortisol borderline at 10.2 but pt not hypotensive  Seizure disorder with seizure in ED -felt to be due to medication noncompliance  -7/2 pt had 4 back-to-back seizures requiring multiple doses of Ativan  -Keppra increased to 1500 BID -no further seizures thus far today  -Neuro re-consulted and agrees with above   Cirrhosis of liver/ascites - hepatitis C + EtOH abuse  -Cont med tx  -treat volume overload as above  UTI  -Culture revealed coag-negative staph resistant to levaquin and pcn so changed to oral  doxycycline  DM -CBG reasonably well controlled at the present time  Hypothyroid -TSH was 2.323 on 08/12/2012 - no change in treatment given question of noncompliance  Bipolar D/O -Continue usual outpatient meds -needs AL vs SNF at dc given repeat hospitalizations for non compliance  Pain in left shoulder -X-ray negative  Code Status: FULL Family Communication: No family present at time of exam Disposition Plan: ALF vs/ SNF  Consultants: [facility]  Procedures: EEG 6/26 - severe generalized continuous slowing of cerebral activity, consistent with severe diffuse encephalopathy, consistent with patient's history of acute recurrent hepatic encephalopathy - no seizure activity was recorded  Antibiotics: Ciprofloxacin 6/26>> 6/27 Rocephin 6/27>>6/28 nitrofurantoion 6/28 > 6/29 Doxy 6/29>>  DVT prophylaxis: SCDs  HPI/Subjective: Patient is unable to provide history at time of exam as he is postictal.  During exam he is noted to experience a tonic type seizure with turning of the head to the left, grunting, elevation of the left arm, and stiffening of both lower extremities to the point that they are elevated off the mattress.  This behavior is extinguished with use of IV Ativan.  Objective: Blood pressure 114/72, pulse 84, temperature 98.9 F (37.2 C), temperature source Oral, resp. rate 15, height 6' (1.829 m), weight 125.1 kg (275 lb 12.7 oz), SpO2 92.00%.  Intake/Output Summary (Last 24 hours) at 08/27/12 1428 Last data filed at 08/27/12 1134  Gross per 24 hour  Intake    120 ml  Output   1075 ml  Net   -955 ml    Exam: General: Post ictal/non-communicative Lungs: Clear to auscultation bilaterally without wheezes or crackles Cardiovascular: Regular rate and rhythm without murmur gallop or rub normal S1 and S2 Abdomen: Nontender, soft, nontender,  protuberant with ascites Extremities: No significant cyanosis, clubbing;  1+ pitting edema legs esp at ankles  Data  Reviewed: Basic Metabolic Panel:  Recent Labs Lab 08/22/12 0740 08/23/12 0535 08/24/12 0555  08/25/12 1741 08/25/12 2100 08/26/12 0550 08/26/12 1031 08/26/12 2026 08/27/12 0500  NA 126* 124* 123*  < > 122*  --  121* 122* 124* 126*  K 3.9 4.1 4.1  --   --   --  3.9 4.8  --   --   CL 92* 89* 89*  --   --   --  89* 88*  --   --   CO2 29 29 30   --   --   --  26 25  --   --   GLUCOSE 99 108* 153*  --   --   --  123* 116*  --   --   BUN 14 14 11   --   --   --  14 15  --   --   CREATININE 0.85 0.80 0.75  --   --   --  0.84 0.84  --   --   CALCIUM 8.3* 7.9* 8.1*  --   --   --  8.1* 8.2*  --   --   MG  --   --   --   --   --  1.5  --   --   --   --   < > = values in this interval not displayed. Liver Function Tests:  Recent Labs Lab 08/21/12 1229 08/22/12 0740 08/23/12 0535 08/26/12 0550  AST 112* 113* 139* 111*  ALT 53 51 57* 56*  ALKPHOS 248* 212* 230* 191*  BILITOT 3.4* 3.1* 3.5* 4.4*  PROT 7.2 6.3 6.8 6.3  ALBUMIN 2.2* 2.0* 2.0* 2.0*    Recent Labs Lab 08/21/12 1230 08/22/12 0740 08/23/12 0535 08/25/12 0600  AMMONIA 178* 54 44 32   CBC:  Recent Labs Lab 08/21/12 1229 08/21/12 1806 08/22/12 0740 08/23/12 0535 08/25/12 0600 08/26/12 0550  WBC 5.9  --  2.9* 4.8 6.1 7.6  NEUTROABS 5.1  --   --   --   --   --   HGB 11.6* 11.6* 10.9* 11.9* 12.5* 11.3*  HCT 31.4* 31.6* 30.3* 33.1* 33.2* 30.6*  MCV 99.7  --  102.0* 102.2* 97.9 99.7  PLT 53*  --  36* 43* 42* 44*   CBG:  Recent Labs Lab 08/26/12 1222 08/26/12 1652 08/26/12 2144 08/27/12 0720 08/27/12 1132  GLUCAP 111* 153* 223* 116* 157*    Recent Results (from the past 240 hour(s))  CULTURE, BLOOD (ROUTINE X 2)     Status: None   Collection Time    08/21/12 12:50 PM      Result Value Range Status   Specimen Description BLOOD RIGHT FEMORAL ARTERY   Final   Special Requests BOTTLES DRAWN AEROBIC ONLY 5CC   Final   Culture  Setup Time 08/21/2012 16:39   Final   Culture NO GROWTH 5 DAYS   Final    Report Status 08/27/2012 FINAL   Final  URINE CULTURE     Status: None   Collection Time    08/21/12  1:43 PM      Result Value Range Status   Specimen Description URINE, CATHETERIZED   Final   Special Requests NONE   Final   Culture  Setup Time 08/21/2012 14:50   Final   Colony Count >=100,000 COLONIES/ML   Final   Culture  Final   Value: STAPHYLOCOCCUS SPECIES (COAGULASE NEGATIVE)     Note: RIFAMPIN AND GENTAMICIN SHOULD NOT BE USED AS SINGLE DRUGS FOR TREATMENT OF STAPH INFECTIONS.   Report Status 08/23/2012 FINAL   Final   Organism ID, Bacteria STAPHYLOCOCCUS SPECIES (COAGULASE NEGATIVE)   Final  MRSA PCR SCREENING     Status: None   Collection Time    08/21/12  8:07 PM      Result Value Range Status   MRSA by PCR NEGATIVE  NEGATIVE Final   Comment:            The GeneXpert MRSA Assay (FDA     approved for NASAL specimens     only), is one component of a     comprehensive MRSA colonization     surveillance program. It is not     intended to diagnose MRSA     infection nor to guide or     monitor treatment for     MRSA infections.  MRSA PCR SCREENING     Status: None   Collection Time    08/25/12  8:45 PM      Result Value Range Status   MRSA by PCR NEGATIVE  NEGATIVE Final   Comment:            The GeneXpert MRSA Assay (FDA     approved for NASAL specimens     only), is one component of a     comprehensive MRSA colonization     surveillance program. It is not     intended to diagnose MRSA     infection nor to guide or     monitor treatment for     MRSA infections.     Scheduled Meds:  Scheduled Meds: . furosemide  80 mg Intravenous Q12H  . insulin aspart  0-15 Units Subcutaneous Q4H  . levETIRAcetam  1,500 mg Intravenous Q12H  . levothyroxine  37.5 mcg Intravenous Daily    Time spent on care of this patient:   ELLIS,ALLISON L. ANP  [facility] Office  [phone]  If 7PM-7AM, please contact night-coverage [email or web address] Password  [email or web address] 08/27/2012, 2:28 PM   LOS: 6 days   I have personally examined this patient and reviewed the entire database. I have reviewed the above note, made any necessary editorial changes, and agree with its content.  Lonia Blood, MD [facility]

## 2012-08-27 NOTE — Plan of Care (Cosign Needed)
In regards to the Samsca: plan is to use this medication ACUTELY with transition to Demclocycline as OP if additional medication rxn indicated at discharge. Primary indication for Samsca in this pt is to promote free water excretion and diuresis in conjunction with Lasix. Unfortunately SIADH labwork was never obtained so this diagnosis will be difficult to confirm post initiation of medical therapy.  Junious Silk, ANP

## 2012-08-28 LAB — COMPREHENSIVE METABOLIC PANEL
ALT: 51 U/L (ref 0–53)
AST: 97 U/L — ABNORMAL HIGH (ref 0–37)
Albumin: 2.1 g/dL — ABNORMAL LOW (ref 3.5–5.2)
Alkaline Phosphatase: 186 U/L — ABNORMAL HIGH (ref 39–117)
BUN: 19 mg/dL (ref 6–23)
CO2: 27 mEq/L (ref 19–32)
Calcium: 8.1 mg/dL — ABNORMAL LOW (ref 8.4–10.5)
Chloride: 90 mEq/L — ABNORMAL LOW (ref 96–112)
Creatinine, Ser: 0.88 mg/dL (ref 0.50–1.35)
GFR calc Af Amer: >90 mL/min (ref >90)
GFR calc non Af Amer: >90 mL/min (ref >90)
Glucose, Bld: 115 mg/dL — ABNORMAL HIGH (ref 70–99)
Potassium: 3.6 mEq/L (ref 3.5–5.1)
Sodium: 126 mEq/L — ABNORMAL LOW (ref 135–145)
Total Bilirubin: 3.3 mg/dL — ABNORMAL HIGH (ref 0.3–1.2)
Total Protein: 6.3 g/dL (ref 6.0–8.3)

## 2012-08-28 LAB — CBC
HCT: 30.9 % — ABNORMAL LOW (ref 39.0–52.0)
Hemoglobin: 11 g/dL — ABNORMAL LOW (ref 13.0–17.0)
MCH: 36.1 pg — ABNORMAL HIGH (ref 26.0–34.0)
MCHC: 35.6 g/dL (ref 30.0–36.0)
MCV: 101.3 fL — ABNORMAL HIGH (ref 78.0–100.0)
Platelets: 43 10*3/uL — ABNORMAL LOW (ref 150–400)
RBC: 3.05 MIL/uL — ABNORMAL LOW (ref 4.22–5.81)
RDW: 14.6 % (ref 11.5–15.5)
WBC: 5.1 10*3/uL (ref 4.0–10.5)

## 2012-08-28 LAB — AMMONIA: Ammonia: 31 umol/L (ref 11–60)

## 2012-08-28 LAB — GLUCOSE, CAPILLARY
Glucose-Capillary: 111 mg/dL — ABNORMAL HIGH (ref 70–99)
Glucose-Capillary: 230 mg/dL — ABNORMAL HIGH (ref 70–99)
Glucose-Capillary: 196 mg/dL — ABNORMAL HIGH (ref 70–99)

## 2012-08-28 MED ORDER — LEVETIRACETAM 750 MG PO TABS: 1500.0000 mg | ORAL_TABLET | Freq: Two times a day (BID) | ORAL | Status: DC

## 2012-08-28 MED ORDER — HALOPERIDOL LACTATE 5 MG/ML IJ SOLN
4.0000 mg | Freq: Four times a day (QID) | INTRAMUSCULAR | Status: DC | PRN
Start: 2012-08-28 — End: 2012-08-28
  Filled 2012-08-28 (×2): qty 1

## 2012-08-28 MED ORDER — HALOPERIDOL LACTATE 5 MG/ML IJ SOLN
4.0000 mg | Freq: Four times a day (QID) | INTRAMUSCULAR | Status: DC | PRN
  Administered 2012-08-28: 4 mg via INTRAMUSCULAR

## 2012-08-28 MED ORDER — INSULIN ASPART 100 UNIT/ML ~~LOC~~ SOLN: SUBCUTANEOUS | Status: DC

## 2012-08-28 MED ORDER — OXYCODONE HCL 10 MG PO TABS: 10.0000 mg | ORAL_TABLET | ORAL | Status: DC | PRN

## 2012-08-28 MED ORDER — FUROSEMIDE 80 MG PO TABS
80.0000 mg | ORAL_TABLET | Freq: Two times a day (BID) | ORAL | Status: DC
  Administered 2012-08-28 – 2012-08-29 (×3): 80 mg via ORAL
  Filled 2012-08-28 (×6): qty 1

## 2012-08-28 MED ORDER — OXYCODONE HCL 5 MG PO TABS
10.0000 mg | ORAL_TABLET | ORAL | Status: DC | PRN
  Administered 2012-08-29 (×2): 10 mg via ORAL
  Filled 2012-08-28 (×3): qty 1
  Filled 2012-08-28: qty 2

## 2012-08-28 MED ORDER — DIAZEPAM 5 MG PO TABS
5.0000 mg | ORAL_TABLET | Freq: Four times a day (QID) | ORAL | Status: DC | PRN
  Administered 2012-08-28: 5 mg via ORAL
  Filled 2012-08-28: qty 1

## 2012-08-28 MED ORDER — LEVETIRACETAM 750 MG PO TABS
1500.0000 mg | ORAL_TABLET | Freq: Two times a day (BID) | ORAL | Status: DC
  Administered 2012-08-28 – 2012-08-29 (×3): 1500 mg via ORAL
  Filled 2012-08-28 (×4): qty 2

## 2012-08-28 MED ORDER — HALOPERIDOL LACTATE 5 MG/ML IJ SOLN: 4.0000 mg | Freq: Four times a day (QID) | INTRAMUSCULAR | Status: DC | PRN

## 2012-08-28 NOTE — Progress Notes (Signed)
[facility] (admissions coordinator)  requested D/C summary on [facility] so that Pt can return over the weekend. .   CSW uploaded d/c summary.   CSW to follow for d/c planning.   Leron Croak, LCSWA [facility] Emergency Dept.  [phone]

## 2012-08-28 NOTE — Progress Notes (Signed)
CSW spoke with MD concerning Pt d/c planning.   MD would like to see if Pt could be d/c'd to SNF [facility]) over the weekend.   CSW contacted [facility] to confirm that Pt still has a bed and question if Pt could be d/c'd to facility over the holiday weekend. Facility stated that Pt could d/c over the weekend ONLY if the facility receives a d/c summary and AVS by tomorrow so that they can align medications and room assignment.   CSW notified MD and awaiting d/c summary for d/c planning.   Leron Croak, LCSWA [facility] Emergency Dept.  [phone]

## 2012-08-28 NOTE — Discharge Summary (Signed)
Physician Discharge Summary  Tony Boyd:[id number] DOB: 1956-02-22 DOA: 08/21/2012  PCP: [name], DO  Admit date: 08/21/2012 Discharge date: 08/28/2012  Time spent: 30 minutes  Recommendations for Outpatient Follow-up:  1. Needs Levetiracetam level in two weeks  Discharge Diagnoses:  Principal Problem:   Hepatic encephalopathy-resolved Active Problems:   Hyponatremia due to volume overload and regular surreptitious ETOH ingestion   Seizure disorder- recurrent seizures during admission   Coag negative staph UTI (lower urinary tract infection)   Nausea with vomiting- resolved   Discharge Condition: stable  Diet recommendation: CARB MODIFIED  Filed Weights   08/21/12 2005 08/22/12 1345 08/28/12 0301  Weight: 125.1 kg (275 lb 12.7 oz) 125.1 kg (275 lb 12.7 oz) 111.2 kg (245 lb 2.4 oz)    History of present illness:  57 y.o. male with a history of end-stage liver disease, hepatitis C, esophageal varices, bipolar disorder, alcohol abuse, type 2 diabetes, and hypothyroidism, who was recently discharged from [facility] on June 19 after an inpatient stay for hepatic encephalopathy. He returned to the emergency department from independent living with hepatic encephalopathy, urinary tract infection, hyponatremia, and pleural effusions. He was unable to give any coherent history due to his encephalopathy. During examination in the emergency department he had a seizure. It appeared likely that he had not been taking his medications since discharge. No family members were present to provide history.   Hospital Course:  Hepatic encephalopathy  -Ammonia level 31 on 7/3  Nausea/vomiting  -stable without recurrence   Hyponatremia  -Chronic and variable - baseline appears to be around 133  -demeclocycline started 6/30 but was dc'd in favor of Samsca  -low Na+ seemed to be due to massive volume overload so Samsca initiated primarily for ACUTE reduction in free water in conjunction  with diuretics  -Sodium has stabilized and he has diuresed almost 13,000 cc since admission so Samsca was dc'd  -cont 1200 cc  fluid restriction.  -cortisol borderline at 10.2 but pt not hypotensive   Seizure disorder with seizure in ED  -felt to be due to medication noncompliance  -7/2 pt had 4 back-to-back seizures requiring multiple doses of Ativan  -Keppra increased to 1500 BID  -no further seizures  -Neuro re-consulted and agrees with above   Cirrhosis of liver/ascites - hepatitis C + EtOH abuse  -Cont med tx  -treat volume overload as above  -discovered that prior to admit pt and been surreptitiously drinking alcohol. He had been obtaining beer and leaving independent living, going into nearby woods to drink- pt may have been going thru alcohol withdrawals this admission -he admitted to drinking two 40 oz beers daily  UTI  -Culture revealed coag-negative staph resistant to levaquin and pcn so changed to oral doxycycline which was completed prior to dc  DM  -CBG reasonably well controlled at the present time on LANTUS -HgbA1c was 10.1 June 5th  Hypothyroid  -TSH was 2.323 on 08/12/2012 - no change in treatment given question of noncompliance   Bipolar D/O  -Continue usual outpatient meds  -needs AL vs SNF at dc given repeat hospitalizations for non compliance   Pain in left shoulder  -X-ray negative   Procedures: EEG 6/26 - severe generalized continuous slowing of cerebral activity, consistent with severe diffuse encephalopathy, consistent with patient's history of acute recurrent hepatic encephalopathy - no seizure activity was recorded   Consultations:  Neurology  Discharge Exam: Filed Vitals:   08/27/12 2355 08/28/12 0300 08/28/12 0301 08/28/12 0800  BP: 100/49  105/51    Pulse: 81 78  116  Temp: 98.4 F (36.9 C) 98.6 F (37 C)    TempSrc: Oral Oral    Resp: 22 18  17   Height:      Weight:   111.2 kg (245 lb 2.4 oz)   SpO2: 95% 98%  94%   General:  Alert, awake and less confused today Lungs: Clear to auscultation bilaterally without wheezes or crackles  Cardiovascular: Regular rate and rhythm without murmur gallop or rub normal S1 and S2  Abdomen: Nontender, soft, nontender, much less protuberant with ascites  Extremities: No significant cyanosis, clubbing; 1+ pitting edema legs esp at ankles has nearly resolved    Discharge Instructions   Future Appointments Provider Department Dept Phone   09/08/2012 8:30 AM [facility] C[facility] [phone]   09/11/2012 10:00 AM Tiffany Alroy Dust, DO [facility] [phone]       Medication List    STOP taking these medications       accu-chek multiclix lancets     APLISOL 5 UNIT/0.1ML injection  Generic drug:  tuberculin     glucose blood test strip  Commonly known as:  ACCU-CHEK ACTIVE STRIPS     glucose monitoring kit monitoring kit     onetouch ultrasoft lancets      TAKE these medications       albuterol 108 (90 BASE) MCG/ACT inhaler  Commonly known as:  PROVENTIL HFA;VENTOLIN HFA  Inhale 2 puffs into the lungs 2 (two) times daily as needed for wheezing.     ferrous sulfate 325 (65 FE) MG tablet  Take 325 mg by mouth at bedtime.     folic acid 1 MG tablet  Commonly known as:  FOLVITE  Take 1 mg by mouth daily.     furosemide 80 MG tablet  Commonly known as:  LASIX  Take 80 mg by mouth 2 (two) times daily.     insulin aspart 100 UNIT/ML injection  Commonly known as:  novoLOG  Please utilize facility based sliding scale insulin protocol/orders     insulin aspart 100 UNIT/ML injection  Commonly known as:  novoLOG  Inject into the skin See admin instructions. 0-149 = 0 units; 150+ = 5 units 15 minutes before or 30 minutes after a meal.  Subcutaneously before meals for dm.     insulin glargine 100 units/mL Soln  Commonly known as:  LANTUS  Inject 60 Units into the skin daily at 10 pm.     lactulose 10 GM/15ML solution  Commonly known as:   CHRONULAC  Take 30 g by mouth 5 (five) times daily.     levETIRAcetam 750 MG tablet  Commonly known as:  KEPPRA  Take 2 tablets (1,500 mg total) by mouth 2 (two) times daily.     levothyroxine 75 MCG tablet  Commonly known as:  SYNTHROID, LEVOTHROID  Take 75 mcg by mouth daily.     multivitamin with minerals Tabs  Take 1 tablet by mouth daily.     omeprazole 20 MG capsule  Commonly known as:  PRILOSEC  Take 20 mg by mouth daily.     Oxycodone HCl 10 MG Tabs  Take 1 tablet (10 mg total) by mouth every 4 (four) hours as needed (pain).     potassium chloride 10 MEQ tablet  Commonly known as:  K-DUR  Take 10 mEq by mouth 2 (two) times daily.     rifaximin 550 MG Tabs  Commonly known as:  Burman Blacksmith  Take 550 mg by mouth 2 (two) times daily.     risperiDONE 0.25 MG tablet  Commonly known as:  RISPERDAL  Take 0.25 mg by mouth 2 (two) times daily.     spironolactone 100 MG tablet  Commonly known as:  ALDACTONE  Take 200 mg by mouth daily.       Allergies  Allergen Reactions  . Ativan (Lorazepam) Other (See Comments)    "hallucinations"  . Droperidol Other (See Comments)    unknown  . Ketorolac Other (See Comments)    unknown  . Penicillins Swelling and Other (See Comments)    unkown  . Toradol (Ketorolac Tromethamine) Hives      The results of significant diagnostics from this hospitalization (including imaging, microbiology, ancillary and laboratory) are listed below for reference.    Significant Diagnostic Studies: Dg Clavicle Left  08/22/2012   *RADIOLOGY REPORT*  Clinical Data: Left clavicular pain  LEFT CLAVICLE - 2+ VIEWS  Comparison: 07/25/2012  Findings: The left clavicle again demonstrates degenerative changes at the acromioclavicular joint.  No acute fracture is seen.  No soft tissue abnormality is noted.  IMPRESSION: Stable appearance of the left clavicle.   Original Report Authenticated By: Alcide Clever, M.D.   Ct Head Wo Contrast  08/21/2012   *RADIOLOGY  REPORT*  Clinical Data: Altered mental status  CT HEAD WITHOUT CONTRAST  Technique:  Contiguous axial images were obtained from the base of the skull through the vertex without contrast.  Comparison: Prior CT from 07/25/12 as well as earlier studies.  Findings: There is no acute intracranial hemorrhage or infarct.  No mass lesion or midline shift.  There is no extra-axial fluid collection.  Mild generalized atrophy is unchanged.  The calvarium is intact.  Small left mastoid effusion is again noted, grossly stable. The paranasal sinuses are clear.  IMPRESSION:  1.  Stable mild atrophy.  2. No acute intracranial process.   Original Report Authenticated By: Rise Mu, M.D.   US Abdomen Limited  08/12/2012   *RADIOLOGY REPORT*  Clinical Data: Assess volume of ascites  LIMITED ABDOMEN ULTRASOUND FOR ASCITES  Technique:  Limited ultrasound survey for ascites was performed in all four abdominal quadrants.  Comparison:  None.  Findings: Four-quadrant survey views of the abdomen reveal no ascites.  IMPRESSION: No ascites identified   Original Report Authenticated By: Esperanza Heir, M.D.   Dg Chest Portable 1 View  08/21/2012   *RADIOLOGY REPORT*  Clinical Data: Altered mental status, history of hepatitis C and renal failure  PORTABLE CHEST - 1 VIEW  Comparison: Chest x-ray of 04/16/2012  Findings: The lungs are not well aerated.  There is mild cardiomegaly present and there may be mild pulmonary vascular congestion as well.  No acute bony abnormality is seen.  IMPRESSION: Poor inspiration.  Cardiomegaly.  Question mild pulmonary vascular congestion.   Original Report Authenticated By: Dwyane Dee, M.D.    Microbiology: Recent Results (from the past 240 hour(s))  CULTURE, BLOOD (ROUTINE X 2)     Status: None   Collection Time    08/21/12 12:50 PM      Result Value Range Status   Specimen Description BLOOD RIGHT FEMORAL ARTERY   Final   Special Requests BOTTLES DRAWN AEROBIC ONLY 5CC   Final   Culture   Setup Time 08/21/2012 16:39   Final   Culture NO GROWTH 5 DAYS   Final   Report Status 08/27/2012 FINAL   Final  URINE CULTURE     Status: None  Collection Time    08/21/12  1:43 PM      Result Value Range Status   Specimen Description URINE, CATHETERIZED   Final   Special Requests NONE   Final   Culture  Setup Time 08/21/2012 14:50   Final   Colony Count >=100,000 COLONIES/ML   Final   Culture     Final   Value: STAPHYLOCOCCUS SPECIES (COAGULASE NEGATIVE)     Note: RIFAMPIN AND GENTAMICIN SHOULD NOT BE USED AS SINGLE DRUGS FOR TREATMENT OF STAPH INFECTIONS.   Report Status 08/23/2012 FINAL   Final   Organism ID, Bacteria STAPHYLOCOCCUS SPECIES (COAGULASE NEGATIVE)   Final  MRSA PCR SCREENING     Status: None   Collection Time    08/21/12  8:07 PM      Result Value Range Status   MRSA by PCR NEGATIVE  NEGATIVE Final   Comment:            The GeneXpert MRSA Assay (FDA     approved for NASAL specimens     only), is one component of a     comprehensive MRSA colonization     surveillance program. It is not     intended to diagnose MRSA     infection nor to guide or     monitor treatment for     MRSA infections.  MRSA PCR SCREENING     Status: None   Collection Time    08/25/12  8:45 PM      Result Value Range Status   MRSA by PCR NEGATIVE  NEGATIVE Final   Comment:            The GeneXpert MRSA Assay (FDA     approved for NASAL specimens     only), is one component of a     comprehensive MRSA colonization     surveillance program. It is not     intended to diagnose MRSA     infection nor to guide or     monitor treatment for     MRSA infections.     Labs: Basic Metabolic Panel:  Recent Labs Lab 08/23/12 0535 08/24/12 0555  08/25/12 2100 08/26/12 0550 08/26/12 1031 08/26/12 2026 08/27/12 0500 08/28/12 0558  NA 124* 123*  < >  --  121* 122* 124* 126* 126*  K 4.1 4.1  --   --  3.9 4.8  --   --  3.6  CL 89* 89*  --   --  89* 88*  --   --  90*  CO2 29 30  --    --  26 25  --   --  27  GLUCOSE 108* 153*  --   --  123* 116*  --   --  115*  BUN 14 11  --   --  14 15  --   --  19  CREATININE 0.80 0.75  --   --  0.84 0.84  --   --  0.88  CALCIUM 7.9* 8.1*  --   --  8.1* 8.2*  --   --  8.1*  MG  --   --   --  1.5  --   --   --   --   --   < > = values in this interval not displayed. Liver Function Tests:  Recent Labs Lab 08/21/12 1229 08/22/12 0740 08/23/12 0535 08/26/12 0550 08/28/12 0558  AST 112* 113* 139* 111* 97*  ALT 53 51 57* 56*  51  ALKPHOS 248* 212* 230* 191* 186*  BILITOT 3.4* 3.1* 3.5* 4.4* 3.3*  PROT 7.2 6.3 6.8 6.3 6.3  ALBUMIN 2.2* 2.0* 2.0* 2.0* 2.1*   No results found for this basename: LIPASE, AMYLASE,  in the last 168 hours  Recent Labs Lab 08/21/12 1230 08/22/12 0740 08/23/12 0535 08/25/12 0600 08/28/12 0558  AMMONIA 178* 54 44 32 31   CBC:  Recent Labs Lab 08/21/12 1229  08/22/12 0740 08/23/12 0535 08/25/12 0600 08/26/12 0550 08/28/12 0558  WBC 5.9  --  2.9* 4.8 6.1 7.6 5.1  NEUTROABS 5.1  --   --   --   --   --   --   HGB 11.6*  < > 10.9* 11.9* 12.5* 11.3* 11.0*  HCT 31.4*  < > 30.3* 33.1* 33.2* 30.6* 30.9*  MCV 99.7  --  102.0* 102.2* 97.9 99.7 101.3*  PLT 53*  --  36* 43* 42* 44* 43*  < > = values in this interval not displayed. Cardiac Enzymes: No results found for this basename: CKTOTAL, CKMB, CKMBINDEX, TROPONINI,  in the last 168 hours BNP: BNP (last 3 results) No results found for this basename: PROBNP,  in the last 8760 hours CBG:  Recent Labs Lab 08/27/12 0720 08/27/12 1132 08/27/12 1634 08/27/12 2126 08/28/12 0758  GLUCAP 116* 157* 122* 202* 111*       Signed:  ELLIS,ALLISON L. ANP  [facility] 08/28/2012, 9:36 AM   I have examined the patient, reviewed the chart and modified the above note which I agree with.   Krystl Wickware,MD [phone] 08/28/2012, 12:12 PM

## 2012-08-29 DIAGNOSIS — G9340 Encephalopathy, unspecified: Secondary | ICD-10-CM

## 2012-08-29 LAB — BASIC METABOLIC PANEL
BUN: 17 mg/dL (ref 6–23)
Chloride: 93 mEq/L — ABNORMAL LOW (ref 96–112)
GFR calc Af Amer: >90 mL/min (ref >90)
GFR calc non Af Amer: >90 mL/min (ref >90)
Glucose, Bld: 175 mg/dL — ABNORMAL HIGH (ref 70–99)
Potassium: 4.5 mEq/L (ref 3.5–5.1)
Sodium: 127 mEq/L — ABNORMAL LOW (ref 135–145)

## 2012-08-29 LAB — LEVETIRACETAM LEVEL: Levetiracetam Lvl: 63.5 ug/mL — ABNORMAL HIGH (ref 5.0–30.0)

## 2012-08-29 LAB — GLUCOSE, CAPILLARY: Glucose-Capillary: 198 mg/dL — ABNORMAL HIGH (ref 70–99)

## 2012-08-29 MED ORDER — LACTULOSE 10 GM/15ML PO SOLN: 30.0000 g | Freq: Every day | ORAL | Status: DC

## 2012-08-29 MED ORDER — OXYCODONE HCL 10 MG PO TABS: 10.0000 mg | ORAL_TABLET | ORAL | Status: DC | PRN

## 2012-08-29 NOTE — Progress Notes (Signed)
Pt to be d/c today to [facility].  Pt and family agreeable. Confirmed plans with facility.  Plan transfer via EMS.   Leron Croak, LCSWA [facility] Emergency Dept.  [phone]

## 2012-08-29 NOTE — Progress Notes (Signed)
Patient examined. Quite alert and oriented today. No complaints. On Exam, moderate ascites (not tense), mild pitting edema. Otherwise exam WNL.  Pt stable to be transferred to SNF today. Instructions given to check Bmet in 1 wk. And Keppra level in 2 wks.    Calvert Cantor, MD [phone]

## 2012-08-29 NOTE — Progress Notes (Signed)
Pt discharging back to [facility] facility, report called to nurse on unit, PTAR notified and pt and family aware of scheduled return

## 2012-09-01 ENCOUNTER — Other Ambulatory Visit: Payer: Self-pay | Admitting: Geriatric Medicine

## 2012-09-01 MED ORDER — OXYCODONE HCL 10 MG PO TABS: 10.0000 mg | ORAL_TABLET | ORAL | Status: DC | PRN

## 2012-09-02 ENCOUNTER — Non-Acute Institutional Stay (SKILLED_NURSING_FACILITY): Payer: Medicare Other | Admitting: Internal Medicine

## 2012-09-02 DIAGNOSIS — E1142 Type 2 diabetes mellitus with diabetic polyneuropathy: Secondary | ICD-10-CM

## 2012-09-02 DIAGNOSIS — F1096 Alcohol use, unspecified with alcohol-induced persisting amnestic disorder: Secondary | ICD-10-CM

## 2012-09-02 DIAGNOSIS — K7682 Hepatic encephalopathy: Secondary | ICD-10-CM

## 2012-09-02 DIAGNOSIS — M545 Low back pain, unspecified: Secondary | ICD-10-CM

## 2012-09-02 DIAGNOSIS — K7030 Alcoholic cirrhosis of liver without ascites: Secondary | ICD-10-CM

## 2012-09-02 DIAGNOSIS — E1165 Type 2 diabetes mellitus with hyperglycemia: Secondary | ICD-10-CM

## 2012-09-02 DIAGNOSIS — IMO0002 Reserved for concepts with insufficient information to code with codable children: Secondary | ICD-10-CM

## 2012-09-02 DIAGNOSIS — G40909 Epilepsy, unspecified, not intractable, without status epilepticus: Secondary | ICD-10-CM

## 2012-09-02 DIAGNOSIS — K7031 Alcoholic cirrhosis of liver with ascites: Secondary | ICD-10-CM

## 2012-09-02 DIAGNOSIS — E1149 Type 2 diabetes mellitus with other diabetic neurological complication: Secondary | ICD-10-CM

## 2012-09-02 DIAGNOSIS — K7290 Hepatic failure, unspecified without coma: Secondary | ICD-10-CM

## 2012-09-02 NOTE — Progress Notes (Signed)
Patient ID: Tony Boyd, male   DOB: October 12, 1955, 57 y.o.   MRN: [id number] Provider:  Gwenith Spitz. Renato Gails, D.O., C.M.D. Location:  [facility]  PCP: [name], DO  Code Status: full code   Allergies  Allergen Reactions  . Ativan (Lorazepam) Other (See Comments)    "hallucinations"  . Droperidol Other (See Comments)    unknown  . Ketorolac Other (See Comments)    unknown  . Penicillins Swelling and Other (See Comments)    unkown  . Toradol (Ketorolac Tromethamine) Hives    Chief Complaint:  Readmission s/p hospitalization for encephalopathy, seizures, volume overload from hep c and alcoholic cirrhosis, hyponatremia  HPI: 57 y.o. male Outpatient of mine who has h/o ongoing alcohol abuse, nonadherence with medications especially lactulose, recent syncopal episode outside of his independent living facility and new diagnosis of diabetes last month was admitted here initially for diabetic teaching and medication adjustment.  He lies about his medications and his alcohol habit (may be due to Korsakoff's from his alcohol abuse).  This hospitalization, he was sent from [facility] due to increased confusion.  He had seizures in the ED.  Review of Systems:  Review of Systems  Constitutional: Negative for fever and chills.  HENT: Negative for congestion.   Eyes: Negative for blurred vision.  Respiratory: Negative for shortness of breath.   Cardiovascular: Negative for chest pain.  Gastrointestinal: Negative for heartburn and constipation.  Genitourinary: Negative for dysuria.  Musculoskeletal: Positive for falls.  Skin: Negative for rash.  Neurological: Positive for seizures. Negative for dizziness, weakness and headaches.  Endo/Heme/Allergies: Bruises/bleeds easily.  Psychiatric/Behavioral: Positive for depression and memory loss. The patient is nervous/anxious.      Past Medical History  Diagnosis Date  . Cirrhosis of liver   . Hepatitis C   . ETOH abuse   .  Hypothyroid   . Psychosis   . Bipolar disorder   . Seizures   . Arthritis   . Back pain   . Coagulopathy     Hx of  . Thrombocytopenia     Hx of  . Pancytopenia     Hx of  . H/O hypokalemia   . Hyponatremia     Hx of  . Korsakoff psychosis   . H/O abdominal abscess   . H/O esophageal varices   . Metabolic encephalopathy   . Hepatic encephalopathy   . Urinary urgency   . H/O renal failure   . H/O ascites   . Altered mental status   . Anemia   . Ascites   . Shortness of breath   . Peripheral vascular disease   . GERD (gastroesophageal reflux disease)   . Thrombocytopenia, acquired 06/02/2012  . Unspecified hypothyroidism 06/02/2012  . Pain in joint, pelvic region and thigh   . Edema   . Obesity, unspecified   . Chest pain, unspecified   . Pneumonitis due to inhalation of food or vomitus   . Other convulsions   . Cellulitis and abscess of upper arm and forearm   . Chronic hepatitis C without mention of hepatic coma   . Hypopotassemia   . Anemia, unspecified   . Other and unspecified coagulation defects   . Thrombocytopenia, unspecified   . Bipolar I disorder, most recent episode (or current) unspecified   . Unspecified psychosis   . Encephalopathy   . Esophageal varices without mention of bleeding   . Esophagitis, unspecified   . Abscess of liver(572.0)   . Chronic kidney disease, unspecified   .  Lumbago   . Personal history of alcoholism   . Personal history of tobacco use, presenting hazards to health   . Cirrhosis of liver without mention of alcohol   . Pneumonitis due to inhalation of food or vomitus 09/19/2010  . Chronic hepatitis C without mention of hepatic coma   . Bipolar I disorder, most recent episode (or current) unspecified   . Unspecified psychosis   . Other ascites   . Personal history of tobacco use, presenting hazards to health   . Cirrhosis of liver without mention of alcohol   . Type 2 diabetes mellitus with diabetic neuropathy    Past  Surgical History  Procedure Laterality Date  . Colostomy    . Cholecystectomy    . Small intestine surgery    . Arm surgery      left arm  . US guided drain placement in ventral hernia abscess  07/21/2010  . Multiple picc line placements    . Esophagogastroduodenoscopy  06/28/2011    Procedure: ESOPHAGOGASTRODUODENOSCOPY (EGD);  Surgeon: Louis Meckel, MD;  Location: [facility];  Service: Endoscopy;  Laterality: N/A;  . Esophagogastroduodenoscopy N/A 05/06/2012    Procedure: ESOPHAGOGASTRODUODENOSCOPY (EGD);  Surgeon: Louis Meckel, MD;  Location: [facility];  Service: Endoscopy;  Laterality: N/A;  . Esophageal banding N/A 05/06/2012    Procedure: ESOPHAGEAL BANDING;  Surgeon: Louis Meckel, MD;  Location: [facility];  Service: Endoscopy;  Laterality: N/A;   Social History:   reports that he quit smoking about 4 years ago. His smoking use included Cigarettes. He smoked 0.00 packs per day. He has never used smokeless tobacco. He reports that he does not drink alcohol or use illicit drugs.  Family History  Problem Relation Age of Onset  . Heart disease Mother     MI  . Lung cancer Brother     Medications: Patient's Medications  New Prescriptions   No medications on file  Previous Medications   ALBUTEROL (PROVENTIL HFA;VENTOLIN HFA) 108 (90 BASE) MCG/ACT INHALER    Inhale 2 puffs into the lungs 2 (two) times daily as needed for wheezing.    FERROUS SULFATE 325 (65 FE) MG TABLET    Take 325 mg by mouth at bedtime.   FOLIC ACID (FOLVITE) 1 MG TABLET    Take 1 mg by mouth daily.   FUROSEMIDE (LASIX) 80 MG TABLET    Take 80 mg by mouth 2 (two) times daily.   INSULIN ASPART (NOVOLOG) 100 UNIT/ML INJECTION    Inject into the skin See admin instructions. 0-149 = 0 units; 150+ = 5 units 15 minutes before or 30 minutes after a meal.  Subcutaneously before meals for dm.   INSULIN ASPART (NOVOLOG) 100 UNIT/ML INJECTION    Please utilize facility based sliding scale insulin  protocol/orders   INSULIN GLARGINE (LANTUS) 100 UNITS/ML SOLN    Inject 60 Units into the skin daily at 10 pm.   LACTULOSE (CHRONULAC) 10 GM/15ML SOLUTION    Take 45 mLs (30 g total) by mouth 5 (five) times daily.   LEVETIRACETAM (KEPPRA) 750 MG TABLET    Take 2 tablets (1,500 mg total) by mouth 2 (two) times daily.   LEVOTHYROXINE (SYNTHROID, LEVOTHROID) 75 MCG TABLET    Take 75 mcg by mouth daily.   MULTIPLE VITAMIN (MULTIVITAMIN WITH MINERALS) TABS    Take 1 tablet by mouth daily.   OMEPRAZOLE (PRILOSEC) 20 MG CAPSULE    Take 20 mg by mouth daily.   OXYCODONE HCL 10 MG  TABS    Take 1 tablet (10 mg total) by mouth every 4 (four) hours as needed (pain).   POTASSIUM CHLORIDE (K-DUR) 10 MEQ TABLET    Take 10 mEq by mouth 2 (two) times daily.   RIFAXIMIN (XIFAXAN) 550 MG TABS    Take 550 mg by mouth 2 (two) times daily.   RISPERIDONE (RISPERDAL) 0.25 MG TABLET    Take 0.25 mg by mouth 2 (two) times daily.   SPIRONOLACTONE (ALDACTONE) 100 MG TABLET    Take 200 mg by mouth daily.  Modified Medications   No medications on file  Discontinued Medications   No medications on file     Physical Exam: There were no vitals filed for this visit. Physical Exam  Constitutional: He appears well-developed and well-nourished. No distress.  HENT:  Head: Normocephalic and atraumatic.  Right Ear: External ear normal.  Left Ear: External ear normal.  Nose: Nose normal.  Mouth/Throat: Oropharynx is clear and moist. No oropharyngeal exudate.  Eyes: Conjunctivae and EOM are normal. Pupils are equal, round, and reactive to light. Scleral icterus is present.  Neck: Normal range of motion. Neck supple. No JVD present. No tracheal deviation present. No thyromegaly present.  Cardiovascular: Normal rate, regular rhythm, normal heart sounds and intact distal pulses.   No murmur heard. Pulmonary/Chest: Effort normal and breath sounds normal. No respiratory distress.  Abdominal: Soft. Bowel sounds are normal. He  exhibits no distension and no mass. There is no tenderness.  Musculoskeletal: Normal range of motion. He exhibits edema. He exhibits no tenderness.  Lymphadenopathy:    He has no cervical adenopathy.  Neurological: He is alert.  confused  Skin:  Dusky skin tone     Labs reviewed: Basic Metabolic Panel:  Recent Labs  16/10/96 2100  08/26/12 1031  08/27/12 0500 08/28/12 0558 08/29/12 0930  NA  --   < > 122*  < > 126* 126* 127*  K  --   < > 4.8  --   --  3.6 4.5  CL  --   < > 88*  --   --  90* 93*  CO2  --   < > 25  --   --  27 27  GLUCOSE  --   < > 116*  --   --  115* 175*  BUN  --   < > 15  --   --  19 17  CREATININE  --   < > 0.84  --   --  0.88 0.86  CALCIUM  --   < > 8.2*  --   --  8.1* 8.1*  MG 1.5  --   --   --   --   --   --   < > = values in this interval not displayed. Liver Function Tests:  Recent Labs  08/23/12 0535 08/26/12 0550 08/28/12 0558  AST 139* 111* 97*  ALT 57* 56* 51  ALKPHOS 230* 191* 186*  BILITOT 3.5* 4.4* 3.3*  PROT 6.8 6.3 6.3  ALBUMIN 2.0* 2.0* 2.1*   Recent Labs  08/23/12 0535 08/25/12 0600 08/28/12 0558  AMMONIA 44 32 31   CBC:  Recent Labs  07/25/12 1831  08/08/12 1250  08/21/12 1229  08/25/12 0600 08/26/12 0550 08/28/12 0558  WBC 8.0  --  4.4  < > 5.9  < > 6.1 7.6 5.1  NEUTROABS 6.5  --  3.4  --  5.1  --   --   --   --  HGB 12.7*  < > 11.9*  < > 11.6*  < > 12.5* 11.3* 11.0*  HCT 35.9*  < > 32.5*  < > 31.4*  < > 33.2* 30.6* 30.9*  MCV 101.4*  --  100.6*  < > 99.7  < > 97.9 99.7 101.3*  PLT 38*  --  39*  < > 53*  < > 42* 44* 43*  < > = values in this interval not displayed. CBG:  Recent Labs  08/28/12 2116 08/29/12 0833 08/29/12 1132  GLUCAP 196* 158* 198*   Imaging and Procedures: EEG 6/26 - severe generalized continuous slowing of cerebral activity, consistent with severe diffuse encephalopathy, consistent with patient's history of acute recurrent hepatic encephalopathy - no seizure activity was recorded    Assessment/Plan 1. Encephalopathy, hepatic -never seems to fully resolve or continues with #2, seems ammonia levels are not consistent with his mental status changes -pt confabulates about things including his last drink and lactulose use  2. Alcoholic Korsakoff syndrome -see #1  3. Alcoholic cirrhosis of liver with ascites -late stages--poor prognosis of <6 mos at this point -has frequent hospitalizations and is not safe to live alone--this has been reviewed with him and his daughter, but he keeps ending up back in the [facility] and refusing assisted living--I believe he does not comply with his fluid restrictions, diet and medications at home alone  4. Seizure disorder -f/u keppra level in 3 wks  5. Type II diabetes mellitus with neurological manifestations, uncontrolled -here for diabetic teaching and should be placed in assisted living thereafter due to fluctuating mentation and need for assistance with meds  6. Low back pain -cont current pain medications  Functional status: dependent for insulin, meds, and needs therapy at this time after hospitalization  Family/ staff Communication: unit supervisor present for visit  Labs/tests ordered:  keppra level

## 2012-09-08 ENCOUNTER — Other Ambulatory Visit: Payer: Medicare Other

## 2012-09-08 NOTE — ED Provider Notes (Signed)
I saw and evaluated the patient, reviewed the resident's note and I agree with the findings and plan.  I directly supervised the insertion of US guided venous access by Dr Fayrene Fearing on 08/21/12.   Raeford Razor, MD 09/08/12 [phone]

## 2012-09-11 ENCOUNTER — Ambulatory Visit: Payer: Medicare Other | Admitting: Internal Medicine

## 2012-09-16 ENCOUNTER — Inpatient Hospital Stay (HOSPITAL_COMMUNITY): Payer: Medicare Other

## 2012-09-16 ENCOUNTER — Emergency Department (HOSPITAL_COMMUNITY): Payer: Medicare Other

## 2012-09-16 ENCOUNTER — Inpatient Hospital Stay (HOSPITAL_COMMUNITY)
Admission: EM | Admit: 2012-09-16 | Discharge: 2012-09-23 | DRG: 442 | Disposition: A | Discharge status: Skilled Nursing Facility | Payer: Medicare Other | Attending: Internal Medicine | Admitting: Internal Medicine

## 2012-09-16 ENCOUNTER — Encounter (HOSPITAL_COMMUNITY): Payer: Self-pay | Admitting: Emergency Medicine

## 2012-09-16 DIAGNOSIS — F1010 Alcohol abuse, uncomplicated: Secondary | ICD-10-CM

## 2012-09-16 DIAGNOSIS — D539 Nutritional anemia, unspecified: Secondary | ICD-10-CM

## 2012-09-16 DIAGNOSIS — K7460 Unspecified cirrhosis of liver: Secondary | ICD-10-CM | POA: Diagnosis present

## 2012-09-16 DIAGNOSIS — D684 Acquired coagulation factor deficiency: Secondary | ICD-10-CM | POA: Diagnosis present

## 2012-09-16 DIAGNOSIS — M129 Arthropathy, unspecified: Secondary | ICD-10-CM | POA: Diagnosis present

## 2012-09-16 DIAGNOSIS — E119 Type 2 diabetes mellitus without complications: Secondary | ICD-10-CM

## 2012-09-16 DIAGNOSIS — E118 Type 2 diabetes mellitus with unspecified complications: Secondary | ICD-10-CM | POA: Diagnosis present

## 2012-09-16 DIAGNOSIS — I8500 Esophageal varices without bleeding: Secondary | ICD-10-CM

## 2012-09-16 DIAGNOSIS — D696 Thrombocytopenia, unspecified: Secondary | ICD-10-CM | POA: Diagnosis present

## 2012-09-16 DIAGNOSIS — D689 Coagulation defect, unspecified: Secondary | ICD-10-CM | POA: Diagnosis present

## 2012-09-16 DIAGNOSIS — Z9119 Patient's noncompliance with other medical treatment and regimen: Secondary | ICD-10-CM

## 2012-09-16 DIAGNOSIS — R55 Syncope and collapse: Secondary | ICD-10-CM

## 2012-09-16 DIAGNOSIS — B1920 Unspecified viral hepatitis C without hepatic coma: Secondary | ICD-10-CM | POA: Diagnosis present

## 2012-09-16 DIAGNOSIS — E1142 Type 2 diabetes mellitus with diabetic polyneuropathy: Secondary | ICD-10-CM | POA: Diagnosis present

## 2012-09-16 DIAGNOSIS — R188 Other ascites: Secondary | ICD-10-CM

## 2012-09-16 DIAGNOSIS — Z91199 Patient's noncompliance with other medical treatment and regimen due to unspecified reason: Secondary | ICD-10-CM

## 2012-09-16 DIAGNOSIS — K7290 Hepatic failure, unspecified without coma: Secondary | ICD-10-CM

## 2012-09-16 DIAGNOSIS — I8510 Secondary esophageal varices without bleeding: Secondary | ICD-10-CM | POA: Diagnosis present

## 2012-09-16 DIAGNOSIS — E871 Hypo-osmolality and hyponatremia: Secondary | ICD-10-CM | POA: Diagnosis present

## 2012-09-16 DIAGNOSIS — Z794 Long term (current) use of insulin: Secondary | ICD-10-CM

## 2012-09-16 DIAGNOSIS — F1021 Alcohol dependence, in remission: Secondary | ICD-10-CM

## 2012-09-16 DIAGNOSIS — K219 Gastro-esophageal reflux disease without esophagitis: Secondary | ICD-10-CM | POA: Diagnosis present

## 2012-09-16 DIAGNOSIS — E1149 Type 2 diabetes mellitus with other diabetic neurological complication: Secondary | ICD-10-CM | POA: Diagnosis present

## 2012-09-16 DIAGNOSIS — N189 Chronic kidney disease, unspecified: Secondary | ICD-10-CM | POA: Diagnosis present

## 2012-09-16 DIAGNOSIS — E669 Obesity, unspecified: Secondary | ICD-10-CM | POA: Diagnosis present

## 2012-09-16 DIAGNOSIS — B1921 Unspecified viral hepatitis C with hepatic coma: Principal | ICD-10-CM | POA: Diagnosis present

## 2012-09-16 DIAGNOSIS — E039 Hypothyroidism, unspecified: Secondary | ICD-10-CM

## 2012-09-16 DIAGNOSIS — G40909 Epilepsy, unspecified, not intractable, without status epilepticus: Secondary | ICD-10-CM | POA: Diagnosis present

## 2012-09-16 DIAGNOSIS — Z79899 Other long term (current) drug therapy: Secondary | ICD-10-CM

## 2012-09-16 DIAGNOSIS — G9340 Encephalopathy, unspecified: Secondary | ICD-10-CM | POA: Insufficient documentation

## 2012-09-16 DIAGNOSIS — Z87891 Personal history of nicotine dependence: Secondary | ICD-10-CM

## 2012-09-16 DIAGNOSIS — F319 Bipolar disorder, unspecified: Secondary | ICD-10-CM | POA: Diagnosis present

## 2012-09-16 LAB — URINALYSIS, ROUTINE W REFLEX MICROSCOPIC
Bilirubin Urine: NEGATIVE
Glucose, UA: NEGATIVE mg/dL
Hgb urine dipstick: NEGATIVE
Ketones, ur: NEGATIVE mg/dL
Nitrite: NEGATIVE
Specific Gravity, Urine: 1.008 (ref 1.005–1.030)
pH: 7 (ref 5.0–8.0)

## 2012-09-16 LAB — CBC WITH DIFFERENTIAL/PLATELET
Basophils Absolute: 0 10*3/uL (ref 0.0–0.1)
Eosinophils Relative: 2 % (ref 0–5)
HCT: 28.3 % — ABNORMAL LOW (ref 39.0–52.0)
Lymphocytes Relative: 16 % (ref 12–46)
Lymphs Abs: 0.8 10*3/uL (ref 0.7–4.0)
MCV: 95.6 fL (ref 78.0–100.0)
Neutro Abs: 3.5 10*3/uL (ref 1.7–7.7)
Platelets: 39 10*3/uL — ABNORMAL LOW (ref 150–400)
RBC: 2.96 MIL/uL — ABNORMAL LOW (ref 4.22–5.81)
RDW: 13.9 % (ref 11.5–15.5)
WBC: 4.9 10*3/uL (ref 4.0–10.5)

## 2012-09-16 LAB — COMPREHENSIVE METABOLIC PANEL
ALT: 49 U/L (ref 0–53)
AST: 68 U/L — ABNORMAL HIGH (ref 0–37)
Alkaline Phosphatase: 204 U/L — ABNORMAL HIGH (ref 39–117)
CO2: 23 mEq/L (ref 19–32)
Chloride: 87 mEq/L — ABNORMAL LOW (ref 96–112)
GFR calc Af Amer: >90 mL/min (ref >90)
GFR calc non Af Amer: >90 mL/min (ref >90)
Glucose, Bld: 147 mg/dL — ABNORMAL HIGH (ref 70–99)
Potassium: 4.4 mEq/L (ref 3.5–5.1)
Sodium: 118 mEq/L — CL (ref 135–145)
Total Bilirubin: 4.9 mg/dL — ABNORMAL HIGH (ref 0.3–1.2)

## 2012-09-16 LAB — POCT I-STAT TROPONIN I

## 2012-09-16 MED ORDER — SODIUM CHLORIDE 0.9 % IV SOLN
1500.0000 mg | Freq: Two times a day (BID) | INTRAVENOUS | Status: DC
  Administered 2012-09-17 (×2): 1500 mg via INTRAVENOUS
  Filled 2012-09-16 (×3): qty 15

## 2012-09-16 MED ORDER — CIPROFLOXACIN IN D5W 400 MG/200ML IV SOLN
400.0000 mg | Freq: Two times a day (BID) | INTRAVENOUS | Status: DC
  Administered 2012-09-17 – 2012-09-18 (×2): 400 mg via INTRAVENOUS
  Filled 2012-09-16 (×6): qty 200

## 2012-09-16 MED ORDER — OXYCODONE HCL 5 MG PO TABS
10.0000 mg | ORAL_TABLET | ORAL | Status: DC | PRN
  Administered 2012-09-17 – 2012-09-23 (×35): 10 mg via ORAL
  Filled 2012-09-16 (×13): qty 2
  Filled 2012-09-16: qty 1
  Filled 2012-09-16 (×22): qty 2

## 2012-09-16 MED ORDER — ALBUTEROL SULFATE HFA 108 (90 BASE) MCG/ACT IN AERS
2.0000 | INHALATION_SPRAY | Freq: Two times a day (BID) | RESPIRATORY_TRACT | Status: DC | PRN
  Filled 2012-09-16: qty 6.7

## 2012-09-16 MED ORDER — INSULIN GLARGINE 100 UNIT/ML ~~LOC~~ SOLN
60.0000 [IU] | Freq: Every day | SUBCUTANEOUS | Status: DC
  Administered 2012-09-17 – 2012-09-22 (×7): 60 [IU] via SUBCUTANEOUS
  Filled 2012-09-16 (×8): qty 0.6

## 2012-09-16 MED ORDER — LEVOTHYROXINE SODIUM 75 MCG PO TABS
75.0000 ug | ORAL_TABLET | Freq: Every day | ORAL | Status: DC
  Administered 2012-09-17 – 2012-09-23 (×7): 75 ug via ORAL
  Filled 2012-09-16 (×10): qty 1

## 2012-09-16 MED ORDER — LACTULOSE 10 GM/15ML PO SOLN
20.0000 g | ORAL | Status: AC
  Administered 2012-09-16: 20 g via ORAL
  Filled 2012-09-16: qty 30

## 2012-09-16 MED ORDER — RISPERIDONE 0.25 MG PO TABS
0.2500 mg | ORAL_TABLET | Freq: Two times a day (BID) | ORAL | Status: DC
  Administered 2012-09-17 – 2012-09-23 (×14): 0.25 mg via ORAL
  Filled 2012-09-16 (×16): qty 1

## 2012-09-16 MED ORDER — FERROUS SULFATE 325 (65 FE) MG PO TABS
325.0000 mg | ORAL_TABLET | Freq: Every day | ORAL | Status: DC
  Administered 2012-09-17 – 2012-09-22 (×7): 325 mg via ORAL
  Filled 2012-09-16 (×9): qty 1

## 2012-09-16 MED ORDER — ONDANSETRON HCL 4 MG PO TABS
4.0000 mg | ORAL_TABLET | Freq: Four times a day (QID) | ORAL | Status: DC | PRN
  Administered 2012-09-19 – 2012-09-23 (×3): 4 mg via ORAL
  Filled 2012-09-16 (×3): qty 1

## 2012-09-16 MED ORDER — SODIUM CHLORIDE 0.9 % IJ SOLN
3.0000 mL | Freq: Two times a day (BID) | INTRAMUSCULAR | Status: DC
  Administered 2012-09-17: 3 mL via INTRAVENOUS

## 2012-09-16 MED ORDER — HYDROMORPHONE HCL PF 1 MG/ML IJ SOLN
0.5000 mg | Freq: Once | INTRAMUSCULAR | Status: AC
  Administered 2012-09-16: 0.5 mg via INTRAMUSCULAR
  Filled 2012-09-16: qty 1

## 2012-09-16 MED ORDER — PANTOPRAZOLE SODIUM 40 MG PO TBEC
40.0000 mg | DELAYED_RELEASE_TABLET | Freq: Every day | ORAL | Status: DC
  Administered 2012-09-17 – 2012-09-23 (×7): 40 mg via ORAL
  Filled 2012-09-16 (×7): qty 1

## 2012-09-16 MED ORDER — CIPROFLOXACIN IN D5W 400 MG/200ML IV SOLN
400.0000 mg | Freq: Once | INTRAVENOUS | Status: AC
  Administered 2012-09-17: 400 mg via INTRAVENOUS
  Filled 2012-09-16: qty 200

## 2012-09-16 MED ORDER — HYDROMORPHONE HCL PF 1 MG/ML IJ SOLN: 0.5000 mg | Freq: Once | INTRAMUSCULAR | Status: DC

## 2012-09-16 MED ORDER — FOLIC ACID 1 MG PO TABS
1.0000 mg | ORAL_TABLET | Freq: Every day | ORAL | Status: DC
  Administered 2012-09-17 – 2012-09-23 (×7): 1 mg via ORAL
  Filled 2012-09-16 (×7): qty 1

## 2012-09-16 MED ORDER — SODIUM CHLORIDE 0.9 % IJ SOLN
3.0000 mL | Freq: Two times a day (BID) | INTRAMUSCULAR | Status: DC
  Administered 2012-09-17 – 2012-09-18 (×3): 3 mL via INTRAVENOUS

## 2012-09-16 MED ORDER — RIFAXIMIN 550 MG PO TABS
550.0000 mg | ORAL_TABLET | Freq: Two times a day (BID) | ORAL | Status: DC
  Administered 2012-09-17 – 2012-09-23 (×14): 550 mg via ORAL
  Filled 2012-09-16 (×17): qty 1

## 2012-09-16 MED ORDER — HYDROMORPHONE HCL PF 1 MG/ML IJ SOLN
1.0000 mg | Freq: Once | INTRAMUSCULAR | Status: AC
  Administered 2012-09-16: 1 mg via INTRAMUSCULAR
  Filled 2012-09-16: qty 1

## 2012-09-16 MED ORDER — SODIUM CHLORIDE 0.9 % IV SOLN
Freq: Once | INTRAVENOUS | Status: AC
  Administered 2012-09-16: 22:00:00 via INTRAVENOUS

## 2012-09-16 MED ORDER — ONDANSETRON HCL 4 MG/2ML IJ SOLN: 4.0000 mg | Freq: Four times a day (QID) | INTRAMUSCULAR | Status: DC | PRN

## 2012-09-16 MED ORDER — INSULIN ASPART 100 UNIT/ML ~~LOC~~ SOLN
0.0000 [IU] | Freq: Three times a day (TID) | SUBCUTANEOUS | Status: DC
  Administered 2012-09-17: 2 [IU] via SUBCUTANEOUS
  Administered 2012-09-18: 1 [IU] via SUBCUTANEOUS
  Administered 2012-09-18: 3 [IU] via SUBCUTANEOUS
  Administered 2012-09-19 (×2): 2 [IU] via SUBCUTANEOUS
  Administered 2012-09-20 (×2): 1 [IU] via SUBCUTANEOUS
  Administered 2012-09-21 (×2): 2 [IU] via SUBCUTANEOUS
  Administered 2012-09-21: 1 [IU] via SUBCUTANEOUS
  Administered 2012-09-22: 3 [IU] via SUBCUTANEOUS
  Administered 2012-09-22 – 2012-09-23 (×2): 2 [IU] via SUBCUTANEOUS

## 2012-09-16 MED ORDER — LACTULOSE 10 GM/15ML PO SOLN
30.0000 g | Freq: Every day | ORAL | Status: DC
  Administered 2012-09-17 (×5): 30 g via ORAL
  Filled 2012-09-16 (×7): qty 45

## 2012-09-16 MED ORDER — ACETAMINOPHEN 650 MG RE SUPP: 650.0000 mg | Freq: Four times a day (QID) | RECTAL | Status: DC | PRN

## 2012-09-16 MED ORDER — ACETAMINOPHEN 325 MG PO TABS: 650.0000 mg | ORAL_TABLET | Freq: Four times a day (QID) | ORAL | Status: DC | PRN

## 2012-09-16 NOTE — ED Notes (Signed)
Critical low sodium=118

## 2012-09-16 NOTE — ED Provider Notes (Addendum)
History    CSN: [id number] Arrival date & time 09/16/12  1708  First MD Initiated Contact with Patient 09/16/12 1724     Chief Complaint  Patient presents with  . Altered Mental Status   (Consider location/radiation/quality/duration/timing/severity/associated sxs/prior Treatment) Patient is a 57 y.o. male presenting with altered mental status.  Altered Mental Status Presenting symptoms: confusion   Presenting symptoms comment:  Patient has history of cirrhosis and was noted to have an elevated ammonia level on July 18 Severity:  Unable to specify Most recent episode:  Yesterday Episode history:  Unable to specify Duration:  2 days Timing:  Constant Progression:  Worsening Chronicity:  Recurrent Context: not head injury   Context comment:  Elevated ammonia level, last level on July 18 was 150  Past Medical History  Diagnosis Date  . Cirrhosis of liver   . Hepatitis C   . ETOH abuse   . Hypothyroid   . Psychosis   . Bipolar disorder   . Seizures   . Arthritis   . Back pain   . Coagulopathy     Hx of  . Thrombocytopenia     Hx of  . Pancytopenia     Hx of  . H/O hypokalemia   . Hyponatremia     Hx of  . Korsakoff psychosis   . H/O abdominal abscess   . H/O esophageal varices   . Metabolic encephalopathy   . Hepatic encephalopathy   . Urinary urgency   . H/O renal failure   . H/O ascites   . Altered mental status   . Anemia   . Ascites   . Shortness of breath   . Peripheral vascular disease   . GERD (gastroesophageal reflux disease)   . Thrombocytopenia, acquired 06/02/2012  . Unspecified hypothyroidism 06/02/2012  . Pain in joint, pelvic region and thigh   . Edema   . Obesity, unspecified   . Chest pain, unspecified   . Pneumonitis due to inhalation of food or vomitus   . Other convulsions   . Cellulitis and abscess of upper arm and forearm   . Chronic hepatitis C without mention of hepatic coma   . Hypopotassemia   . Anemia, unspecified   . Other  and unspecified coagulation defects   . Thrombocytopenia, unspecified   . Bipolar I disorder, most recent episode (or current) unspecified   . Unspecified psychosis   . Encephalopathy   . Esophageal varices without mention of bleeding   . Esophagitis, unspecified   . Abscess of liver(572.0)   . Chronic kidney disease, unspecified   . Lumbago   . Personal history of alcoholism   . Personal history of tobacco use, presenting hazards to health   . Cirrhosis of liver without mention of alcohol   . Pneumonitis due to inhalation of food or vomitus 09/19/2010  . Chronic hepatitis C without mention of hepatic coma   . Bipolar I disorder, most recent episode (or current) unspecified   . Unspecified psychosis   . Other ascites   . Personal history of tobacco use, presenting hazards to health   . Cirrhosis of liver without mention of alcohol   . Type 2 diabetes mellitus with diabetic neuropathy    Past Surgical History  Procedure Laterality Date  . Colostomy    . Cholecystectomy    . Small intestine surgery    . Arm surgery      left arm  . US guided drain placement in ventral hernia abscess  07/21/2010  . Multiple picc line placements    . Esophagogastroduodenoscopy  06/28/2011    Procedure: ESOPHAGOGASTRODUODENOSCOPY (EGD);  Surgeon: Louis Meckel, MD;  Location: [facility];  Service: Endoscopy;  Laterality: N/A;  . Esophagogastroduodenoscopy N/A 05/06/2012    Procedure: ESOPHAGOGASTRODUODENOSCOPY (EGD);  Surgeon: Louis Meckel, MD;  Location: [facility];  Service: Endoscopy;  Laterality: N/A;  . Esophageal banding N/A 05/06/2012    Procedure: ESOPHAGEAL BANDING;  Surgeon: Louis Meckel, MD;  Location: [facility];  Service: Endoscopy;  Laterality: N/A;   Family History  Problem Relation Age of Onset  . Heart disease Mother     MI  . Lung cancer Brother    History  Substance Use Topics  . Smoking status: Former Smoker    Types: Cigarettes    Quit date: 02/15/2008  .  Smokeless tobacco: Never Used  . Alcohol Use: No    Review of Systems  Unable to perform ROS Psychiatric/Behavioral: Positive for confusion and altered mental status.    Allergies  Ativan; Droperidol; Ketorolac; Penicillins; and Toradol  Home Medications   Current Outpatient Rx  Name  Route  Sig  Dispense  Refill  . albuterol (PROVENTIL HFA;VENTOLIN HFA) 108 (90 BASE) MCG/ACT inhaler   Inhalation   Inhale 2 puffs into the lungs 2 (two) times daily as needed for wheezing.          . ferrous sulfate 325 (65 FE) MG tablet   Oral   Take 325 mg by mouth at bedtime.         . folic acid (FOLVITE) 1 MG tablet   Oral   Take 1 mg by mouth daily.         . furosemide (LASIX) 80 MG tablet   Oral   Take 80 mg by mouth 2 (two) times daily.         . insulin aspart (NOVOLOG) 100 UNIT/ML injection   Subcutaneous   Inject 0-5 Units into the skin See admin instructions. 0-149 = 0 units; 150+ = 5 units 15 minutes before or 30 minutes after a meal.  Subcutaneously before meals for dm.         . insulin glargine (LANTUS) 100 UNIT/ML injection   Subcutaneous   Inject 60 Units into the skin at bedtime.         [name] lactulose (CHRONULAC) 10 GM/15ML solution   Oral   Take 30 g by mouth 5 (five) times daily.         [name] levETIRAcetam (KEPPRA) 750 MG tablet   Oral   Take 1,500 mg by mouth 2 (two) times daily.         [name] levothyroxine (SYNTHROID, LEVOTHROID) 75 MCG tablet   Oral   Take 75 mcg by mouth daily.         . Multiple Vitamin (MULTIVITAMIN WITH MINERALS) TABS   Oral   Take 1 tablet by mouth daily.         [name] omeprazole (PRILOSEC) 20 MG capsule   Oral   Take 20 mg by mouth daily.         . Oxycodone HCl 10 MG TABS   Oral   Take 10 mg by mouth every 4 (four) hours as needed (for pain).         . potassium chloride (K-DUR) 10 MEQ tablet   Oral   Take 10 mEq by mouth 2 (two) times daily.         . rifaximin (XIFAXAN) 550  MG TABS   Oral   Take 550 mg  by mouth 2 (two) times daily.         . risperiDONE (RISPERDAL) 0.25 MG tablet   Oral   Take 0.25 mg by mouth 2 (two) times daily.         [name] spironolactone (ALDACTONE) 100 MG tablet   Oral   Take 200 mg by mouth daily.          BP 123/48  Pulse 88  Temp(Src) 98.5 F (36.9 C) (Oral)  Resp 18  SpO2 100% Physical Exam  Nursing note and vitals reviewed. Constitutional: He is oriented to person, place, and time. He appears well-developed and well-nourished. No distress.  HENT:  Head: Normocephalic and atraumatic.  Eyes: Conjunctivae and EOM are normal. Right eye exhibits no discharge. Left eye exhibits no discharge.  Neck: Normal range of motion. Neck supple. No tracheal deviation present.  Cardiovascular: Normal rate, regular rhythm and normal heart sounds.  Exam reveals no friction rub.   No murmur heard. Pulmonary/Chest: Effort normal and breath sounds normal. No stridor. No respiratory distress. He has no wheezes. He has no rales. He exhibits no tenderness.  Abdominal: Soft. He exhibits no distension. There is no tenderness. There is no rebound and no guarding.  Neurological: He is alert and oriented to person, place, and time. GCS eye subscore is 4. GCS verbal subscore is 5. GCS motor subscore is 6.  Asterixis bilaterally Freely moves all 4 extremities  Skin: Skin is warm.  Psychiatric: He has a normal mood and affect.    ED Course  Procedures (including critical care time) Labs Reviewed  CBC WITH DIFFERENTIAL - Abnormal; Notable for the following:    RBC 2.96 (*)    Hemoglobin 10.6 (*)    HCT 28.3 (*)    MCH 35.8 (*)    MCHC 37.5 (*)    Platelets 39 (*)    All other components within normal limits  COMPREHENSIVE METABOLIC PANEL - Abnormal; Notable for the following:    Sodium 118 (*)    Chloride 87 (*)    Glucose, Bld 147 (*)    Albumin 2.2 (*)    AST 68 (*)    Alkaline Phosphatase 204 (*)    Total Bilirubin 4.9 (*)    All other components within normal  limits  AMMONIA - Abnormal; Notable for the following:    Ammonia 63 (*)    All other components within normal limits  URINALYSIS, ROUTINE W REFLEX MICROSCOPIC  POCT I-STAT TROPONIN I   Results for orders placed during the hospital encounter of 09/16/12  CBC WITH DIFFERENTIAL      Result Value Range   WBC 4.9  4.0 - 10.5 K/uL   RBC 2.96 (*) 4.22 - 5.81 MIL/uL   Hemoglobin 10.6 (*) 13.0 - 17.0 g/dL   HCT 16.1 (*) 09.6 - 04.5 %   MCV 95.6  78.0 - 100.0 fL   MCH 35.8 (*) 26.0 - 34.0 pg   MCHC 37.5 (*) 30.0 - 36.0 g/dL   RDW 40.9  81.1 - 91.4 %   Platelets 39 (*) 150 - 400 K/uL   Neutrophils Relative % 71  43 - 77 %   Neutro Abs 3.5  1.7 - 7.7 K/uL   Lymphocytes Relative 16  12 - 46 %   Lymphs Abs 0.8  0.7 - 4.0 K/uL   Monocytes Relative 11  3 - 12 %   Monocytes Absolute 0.5  0.1 -  1.0 K/uL   Eosinophils Relative 2  0 - 5 %   Eosinophils Absolute 0.1  0.0 - 0.7 K/uL   Basophils Relative 0  0 - 1 %   Basophils Absolute 0.0  0.0 - 0.1 K/uL  COMPREHENSIVE METABOLIC PANEL      Result Value Range   Sodium 118 (*) 135 - 145 mEq/L   Potassium 4.4  3.5 - 5.1 mEq/L   Chloride 87 (*) 96 - 112 mEq/L   CO2 23  19 - 32 mEq/L   Glucose, Bld 147 (*) 70 - 99 mg/dL   BUN 13  6 - 23 mg/dL   Creatinine, Ser 7.82  0.50 - 1.35 mg/dL   Calcium 8.5  8.4 - 95.6 mg/dL   Total Protein 6.3  6.0 - 8.3 g/dL   Albumin 2.2 (*) 3.5 - 5.2 g/dL   AST 68 (*) 0 - 37 U/L   ALT 49  0 - 53 U/L   Alkaline Phosphatase 204 (*) 39 - 117 U/L   Total Bilirubin 4.9 (*) 0.3 - 1.2 mg/dL   GFR calc non Af Amer >90  >90 mL/min   GFR calc Af Amer >90  >90 mL/min  AMMONIA      Result Value Range   Ammonia 63 (*) 11 - 60 umol/L  URINALYSIS, ROUTINE W REFLEX MICROSCOPIC      Result Value Range   Color, Urine YELLOW  YELLOW   APPearance CLEAR  CLEAR   Specific Gravity, Urine 1.008  1.005 - 1.030   pH 7.0  5.0 - 8.0   Glucose, UA NEGATIVE  NEGATIVE mg/dL   Hgb urine dipstick NEGATIVE  NEGATIVE   Bilirubin Urine  NEGATIVE  NEGATIVE   Ketones, ur NEGATIVE  NEGATIVE mg/dL   Protein, ur NEGATIVE  NEGATIVE mg/dL   Urobilinogen, UA 1.0  0.0 - 1.0 mg/dL   Nitrite NEGATIVE  NEGATIVE   Leukocytes, UA NEGATIVE  NEGATIVE  POCT I-STAT TROPONIN I      Result Value Range   Troponin i, poc 0.03  0.00 - 0.08 ng/mL   Comment 3             Dg Chest Port 1 View  09/16/2012   *RADIOLOGY REPORT*  Clinical Data: Altered mental status.  Mid abdominal pain.  PORTABLE CHEST - 1 VIEW  Comparison: 08/21/2012  Findings: Normal heart size and pulmonary vascularity. Peribronchial thickening and central interstitial changes suggesting chronic bronchitis.  No focal airspace disease or consolidation in the lungs.  No blunting of costophrenic angles. No pneumothorax.  Mediastinal contours appear intact.  No significant change since previous study.  IMPRESSION: Chronic bronchitic changes.  No evidence of active infiltration.   Original Report Authenticated By: Burman Nieves, M.D.   Dg Abd Portable 1v  09/16/2012   *RADIOLOGY REPORT*  Clinical Data: Abdominal pain.  Altered mental status.  PORTABLE ABDOMEN - 1 VIEW  Comparison: CT abdomen and pelvis 09/11/2010  Findings: The entire abdomen is not included within the field of view, limiting its evaluation.  Diffuse prominence of gas-filled mid abdominal small bowel and colon. Similar changes were demonstrated on the previous CT abdomen.  Findings likely to represent ileus.  No radiopaque stones visualized.  Degenerative changes in the lumbar spine.  IMPRESSION: Prominent gas distended small and large bowel throughout the abdomen most suggestive of ileus.   Original Report Authenticated By: Burman Nieves, M.D.   1. Acute encephalopathy   2. Cirrhosis   3. Diabetes mellitus   4.  Hyponatremia     Date: 09/16/2012  Rate: 95  Rhythm: normal sinus rhythm  QRS Axis: left  Intervals: PR prolonged  ST/T Wave abnormalities: normal  Conduction Disutrbances:first-degree A-V block    Narrative Interpretation: NSR  Old EKG Reviewed: unchanged   MDM  Solon Palm 57 y.o. with history of cirrhosis presents for altered mental status and elevated ammonia level. Patient's alert oriented to only to self and place. He's afebrile vital signs are stable. Asterixis noted on exam. No focal deficits on exam. Patient moving all 4 extremities voluntarily. Ammonia 63 year. Sodium 118. Patient was given lactulose emergency department. Doubt intracranial hemorrhage. Doubt infectious patient is afebrile an alternative source apparent. Encephalopathy cannot be ruled out considering the patient is alert and communicative, deferred CT scan. Admission indicated for hyperammonemia and hyponatremia. Hyponatremia likely secondary to dehydration, and diuretic use. Patient was admitted to hospitalist service.  Labs reviewed. I discussed this patient's care with my attending, Dr. Bernette Mayers.  Sena Hitch, MD 09/16/12 2313  Sena Hitch, MD 09/16/12 [phone]

## 2012-09-16 NOTE — H&P (Addendum)
[facility] History and Physical  Tony Boyd AVW:[id number] DOB: October 24, 1955 DOA: 09/16/2012  Referring physician: ER physician. PCP: [name], DO   Chief Complaint: Altered mental status.  HPI: Tony Boyd is a 57 y.o. male with history of cirrhosis of liver, hepatitis C, hypothyroidism, seizures was brought to the ER after patient was found to be confused at the nursing facility. In the nursing facility patient was found to have an ammonia level around 160 which was repeated here in the ER and was found to be around 63. In addition patient is also found to be hyponatremic. Patient on exam presently is alert awake oriented x3 and follows commands. Still has some mild abdominal discomfort but denies any chest pain or shortness of breath. Patient states that he had some nausea vomiting yesterday. Patient was given IV fluids in the ER and lactulose and has been admitted for further management.  Review of Systems: As presented in the history of presenting illness, rest negative.  Past Medical History  Diagnosis Date  . Cirrhosis of liver   . Hepatitis C   . ETOH abuse   . Hypothyroid   . Psychosis   . Bipolar disorder   . Seizures   . Arthritis   . Back pain   . Coagulopathy     Hx of  . Thrombocytopenia     Hx of  . Pancytopenia     Hx of  . H/O hypokalemia   . Hyponatremia     Hx of  . Korsakoff psychosis   . H/O abdominal abscess   . H/O esophageal varices   . Metabolic encephalopathy   . Hepatic encephalopathy   . Urinary urgency   . H/O renal failure   . H/O ascites   . Altered mental status   . Anemia   . Ascites   . Shortness of breath   . Peripheral vascular disease   . GERD (gastroesophageal reflux disease)   . Thrombocytopenia, acquired 06/02/2012  . Unspecified hypothyroidism 06/02/2012  . Pain in joint, pelvic region and thigh   . Edema   . Obesity, unspecified   . Chest pain, unspecified   . Pneumonitis due to inhalation of food or vomitus    . Other convulsions   . Cellulitis and abscess of upper arm and forearm   . Chronic hepatitis C without mention of hepatic coma   . Hypopotassemia   . Anemia, unspecified   . Other and unspecified coagulation defects   . Thrombocytopenia, unspecified   . Bipolar I disorder, most recent episode (or current) unspecified   . Unspecified psychosis   . Encephalopathy   . Esophageal varices without mention of bleeding   . Esophagitis, unspecified   . Abscess of liver(572.0)   . Chronic kidney disease, unspecified   . Lumbago   . Personal history of alcoholism   . Personal history of tobacco use, presenting hazards to health   . Cirrhosis of liver without mention of alcohol   . Pneumonitis due to inhalation of food or vomitus 09/19/2010  . Chronic hepatitis C without mention of hepatic coma   . Bipolar I disorder, most recent episode (or current) unspecified   . Unspecified psychosis   . Other ascites   . Personal history of tobacco use, presenting hazards to health   . Cirrhosis of liver without mention of alcohol   . Type 2 diabetes mellitus with diabetic neuropathy    Past Surgical History  Procedure Laterality Date  . Colostomy    .  Cholecystectomy    . Small intestine surgery    . Arm surgery      left arm  . US guided drain placement in ventral hernia abscess  07/21/2010  . Multiple picc line placements    . Esophagogastroduodenoscopy  06/28/2011    Procedure: ESOPHAGOGASTRODUODENOSCOPY (EGD);  Surgeon: Louis Meckel, MD;  Location: [facility];  Service: Endoscopy;  Laterality: N/A;  . Esophagogastroduodenoscopy N/A 05/06/2012    Procedure: ESOPHAGOGASTRODUODENOSCOPY (EGD);  Surgeon: Louis Meckel, MD;  Location: [facility];  Service: Endoscopy;  Laterality: N/A;  . Esophageal banding N/A 05/06/2012    Procedure: ESOPHAGEAL BANDING;  Surgeon: Louis Meckel, MD;  Location: [facility];  Service: Endoscopy;  Laterality: N/A;   Social History:  reports that he quit  smoking about 4 years ago. His smoking use included Cigarettes. He smoked 0.00 packs per day. He has never used smokeless tobacco. He reports that he does not drink alcohol or use illicit drugs. Nursing home. where does patient live-- Not sure. Can patient participate in ADLs?  Allergies  Allergen Reactions  . Ativan (Lorazepam) Other (See Comments)    "hallucinations"  . Droperidol Other (See Comments)    unknown  . Ketorolac Other (See Comments)    unknown  . Penicillins Swelling and Other (See Comments)    unkown  . Toradol (Ketorolac Tromethamine) Hives    Family History  Problem Relation Age of Onset  . Heart disease Mother     MI  . Lung cancer Brother       Prior to Admission medications   Medication Sig Start Date End Date Taking? Authorizing Provider  albuterol (PROVENTIL HFA;VENTOLIN HFA) 108 (90 BASE) MCG/ACT inhaler Inhale 2 puffs into the lungs 2 (two) times daily as needed for wheezing.    Yes Historical Provider, MD  ferrous sulfate 325 (65 FE) MG tablet Take 325 mg by mouth at bedtime.   Yes Historical Provider, MD  folic acid (FOLVITE) 1 MG tablet Take 1 mg by mouth daily.   Yes Historical Provider, MD  furosemide (LASIX) 80 MG tablet Take 80 mg by mouth 2 (two) times daily.   Yes Historical Provider, MD  insulin aspart (NOVOLOG) 100 UNIT/ML injection Inject 0-5 Units into the skin See admin instructions. 0-149 = 0 units; 150+ = 5 units 15 minutes before or 30 minutes after a meal.  Subcutaneously before meals for dm.   Yes Historical Provider, MD  insulin glargine (LANTUS) 100 UNIT/ML injection Inject 60 Units into the skin at bedtime.   Yes Historical Provider, MD  lactulose (CHRONULAC) 10 GM/15ML solution Take 30 g by mouth 5 (five) times daily.   Yes Historical Provider, MD  levETIRAcetam (KEPPRA) 750 MG tablet Take 1,500 mg by mouth 2 (two) times daily.   Yes Historical Provider, MD  levothyroxine (SYNTHROID, LEVOTHROID) 75 MCG tablet Take 75 mcg by mouth  daily.   Yes Historical Provider, MD  Multiple Vitamin (MULTIVITAMIN WITH MINERALS) TABS Take 1 tablet by mouth daily.   Yes Historical Provider, MD  omeprazole (PRILOSEC) 20 MG capsule Take 20 mg by mouth daily.   Yes Historical Provider, MD  Oxycodone HCl 10 MG TABS Take 10 mg by mouth every 4 (four) hours as needed (for pain).   Yes Historical Provider, MD  potassium chloride (K-DUR) 10 MEQ tablet Take 10 mEq by mouth 2 (two) times daily.   Yes Historical Provider, MD  rifaximin (XIFAXAN) 550 MG TABS Take 550 mg by mouth 2 (two) times daily.  01/26/12  Yes Alison Murray, MD  risperiDONE (RISPERDAL) 0.25 MG tablet Take 0.25 mg by mouth 2 (two) times daily.   Yes Historical Provider, MD  spironolactone (ALDACTONE) 100 MG tablet Take 200 mg by mouth daily.   Yes Historical Provider, MD   Physical Exam: Filed Vitals:   09/16/12 2100 09/16/12 2115 09/16/12 2135 09/16/12 2200  BP: 119/66 119/60 122/99 123/48  Pulse: 93 92 88 88  Temp:      TempSrc:      Resp: 15 15 14 18   SpO2: 100% 100% 100% 100%     General:  Well-developed and nourished.  Eyes: Anicteric no pallor.  ENT: No discharge from ears eyes nose mouth.  Neck: No mass felt. No neck rigidity.  Cardiovascular: S1-S2 heard.  Respiratory: No rhonchi or crepitations.  Abdomen: Mildly distended but soft nontender bowel sounds present.  Skin: Chronic skin changes in the lower extremity.  Musculoskeletal: Mild edema nonpitting.  Psychiatric: Appears normal at this time.  Neurologic: Alert awake oriented to time place and person. Moves all extremities.  Labs on Admission:  Basic Metabolic Panel:  Recent Labs Lab 09/16/12 2040  NA 118*  K 4.4  CL 87*  CO2 23  GLUCOSE 147*  BUN 13  CREATININE 0.79  CALCIUM 8.5   Liver Function Tests:  Recent Labs Lab 09/16/12 2040  AST 68*  ALT 49  ALKPHOS 204*  BILITOT 4.9*  PROT 6.3  ALBUMIN 2.2*   No results found for this basename: LIPASE, AMYLASE,  in the last 168  hours  Recent Labs Lab 09/16/12 2040  AMMONIA 63*   CBC: No results found for this basename: WBC, NEUTROABS, HGB, HCT, MCV, PLT,  in the last 168 hours Cardiac Enzymes: No results found for this basename: CKTOTAL, CKMB, CKMBINDEX, TROPONINI,  in the last 168 hours  BNP (last 3 results) No results found for this basename: PROBNP,  in the last 8760 hours CBG: No results found for this basename: GLUCAP,  in the last 168 hours  Radiological Exams on Admission: No results found.   Assessment/Plan Principal Problem:   Acute encephalopathy Active Problems:   Hyponatremia   Cirrhosis   Seizure   Diabetes mellitus   1. Acute encephalopathy most likely secondary to hepatic encephalopathy - continue with lactulose and closely follow ammonia levels. Check CT head. Place patient empirically on antibiotic for SBP. 2. Hyponatremia - at this time I think patient's dehydrated. Patient did receive some IV fluids in the ER. We will decide about further IV fluids based on urine sodium and osmolality and metabolic panel. Closely follow basic metabolic panel every 4 hourly to check sodium trends. This time I'm holding off patient's spironolactone and Lasix as patient clinically looks dehydrated. Since patient has chronic hyponatremia patient would not be a candidate for spironolactone though he has cirrhosis. 3. Seizures - at this time since patient's intake is not very reliable I have placed patient on IV Keppra until patient can reliably take orally. 4. Abdominal discomfort with mildly elevated LFTs - I have ordered sonogram of the abdomen to check for any gallbladder or liver pathology and ascites. For now I have placed patient empirically on antibiotics for possible SBP. If sonogram does show significant ascites patient has to get a diagnostic paracentesis. 5. Diabetes mellitus type 2 - continue home medications. 6. Hypothyroidism  - continue Synthroid. Check TSH.   CBC is pending.   Code  Status:  full code.   Family Communication:  none.  Disposition Plan: admit to inpatient.     Graceland Wachter N. [facility] Pager [phone].   If 7PM-7AM, please contact night-coverage [email or web address] Password [facility] 09/16/2012, 10:36 PM

## 2012-09-16 NOTE — ED Provider Notes (Addendum)
I saw and evaluated the patient, reviewed the resident's note and I agree with the findings and plan.  Agree with EKG interpretation if present.   Pt with recurrent hepatic encephalopathy brought to the ED from ALF with 2 days worsening mental status and labs showing Ammonia >150 from 4 days ago. Alert but confused. Vitals reviewed and unremarkable.    Donicia Druck B. Bernette Mayers, MD 09/16/12 2315

## 2012-09-16 NOTE — ED Notes (Signed)
[facility] presents with a 57 yo male from [facility] with altered mental status.  Pt was bloodwork was tested at [facility] and found that his ammonia levels were elevated and his confusion was increased and was sent to this facility for treatment.  VS stable.

## 2012-09-16 NOTE — Progress Notes (Signed)
ANTIBIOTIC CONSULT NOTE - INITIAL  Pharmacy Consult for Cipro Indication:  Possible SBP  Allergies  Allergen Reactions  . Ativan (Lorazepam) Other (See Comments)    "hallucinations"  . Droperidol Other (See Comments)    unknown  . Ketorolac Other (See Comments)    unknown  . Penicillins Swelling and Other (See Comments)    unkown  . Toradol (Ketorolac Tromethamine) Hives    Patient Measurements: Weight:  111.2 kg Height:  72 inches Ideal Body Weight (IBW): 77.60 kg Adjusted Body Weight: 91.0 kg Percent over ibw: 43.04 %  Vital Signs: Temp: 98.5 F (36.9 C) (07/22 1722) Temp src: Oral (07/22 1722) BP: 123/48 mmHg (07/22 2200) Pulse Rate: 88 (07/22 2200)  Labs:  Recent Labs  09/16/12 2040  WBC 4.9  HGB 10.6*  PLT 39*  CREATININE 0.79    Microbiology: Recent Results (from the past 720 hour(s))  CULTURE, BLOOD (ROUTINE X 2)     Status: None   Collection Time    08/21/12 12:50 PM      Result Value Range Status   Specimen Description BLOOD RIGHT FEMORAL ARTERY   Final   Special Requests BOTTLES DRAWN AEROBIC ONLY 5CC   Final   Culture  Setup Time 08/21/2012 16:39   Final   Culture NO GROWTH 5 DAYS   Final   Report Status 08/27/2012 FINAL   Final  URINE CULTURE     Status: None   Collection Time    08/21/12  1:43 PM      Result Value Range Status   Specimen Description URINE, CATHETERIZED   Final   Special Requests NONE   Final   Culture  Setup Time 08/21/2012 14:50   Final   Colony Count >=100,000 COLONIES/ML   Final   Culture     Final   Value: STAPHYLOCOCCUS SPECIES (COAGULASE NEGATIVE)     Note: RIFAMPIN AND GENTAMICIN SHOULD NOT BE USED AS SINGLE DRUGS FOR TREATMENT OF STAPH INFECTIONS.   Report Status 08/23/2012 FINAL   Final   Organism ID, Bacteria STAPHYLOCOCCUS SPECIES (COAGULASE NEGATIVE)   Final  MRSA PCR SCREENING     Status: None   Collection Time    08/21/12  8:07 PM      Result Value Range Status   MRSA by PCR NEGATIVE  NEGATIVE Final   Comment:            The GeneXpert MRSA Assay (FDA     approved for NASAL specimens     only), is one component of a     comprehensive MRSA colonization     surveillance program. It is not     intended to diagnose MRSA     infection nor to guide or     monitor treatment for     MRSA infections.  MRSA PCR SCREENING     Status: None   Collection Time    08/25/12  8:45 PM      Result Value Range Status   MRSA by PCR NEGATIVE  NEGATIVE Final   Comment:            The GeneXpert MRSA Assay (FDA     approved for NASAL specimens     only), is one component of a     comprehensive MRSA colonization     surveillance program. It is not     intended to diagnose MRSA     infection nor to guide or     monitor treatment for  MRSA infections.    Medical History: Past Medical History  Diagnosis Date  . Cirrhosis of liver   . Hepatitis C   . ETOH abuse   . Hypothyroid   . Psychosis   . Bipolar disorder   . Seizures   . Arthritis   . Back pain   . Coagulopathy     Hx of  . Thrombocytopenia     Hx of  . Pancytopenia     Hx of  . H/O hypokalemia   . Hyponatremia     Hx of  . Korsakoff psychosis   . H/O abdominal abscess   . H/O esophageal varices   . Metabolic encephalopathy   . Hepatic encephalopathy   . Urinary urgency   . H/O renal failure   . H/O ascites   . Altered mental status   . Anemia   . Ascites   . Shortness of breath   . Peripheral vascular disease   . GERD (gastroesophageal reflux disease)   . Thrombocytopenia, acquired 06/02/2012  . Unspecified hypothyroidism 06/02/2012  . Pain in joint, pelvic region and thigh   . Edema   . Obesity, unspecified   . Chest pain, unspecified   . Pneumonitis due to inhalation of food or vomitus   . Other convulsions   . Cellulitis and abscess of upper arm and forearm   . Chronic hepatitis C without mention of hepatic coma   . Hypopotassemia   . Anemia, unspecified   . Other and unspecified coagulation defects   .  Thrombocytopenia, unspecified   . Bipolar I disorder, most recent episode (or current) unspecified   . Unspecified psychosis   . Encephalopathy   . Esophageal varices without mention of bleeding   . Esophagitis, unspecified   . Abscess of liver(572.0)   . Chronic kidney disease, unspecified   . Lumbago   . Personal history of alcoholism   . Personal history of tobacco use, presenting hazards to health   . Cirrhosis of liver without mention of alcohol   . Pneumonitis due to inhalation of food or vomitus 09/19/2010  . Chronic hepatitis C without mention of hepatic coma   . Bipolar I disorder, most recent episode (or current) unspecified   . Unspecified psychosis   . Other ascites   . Personal history of tobacco use, presenting hazards to health   . Cirrhosis of liver without mention of alcohol   . Type 2 diabetes mellitus with diabetic neuropathy     Medications:  Anti-infectives   None     Assessment: 57 yo male admitted with acute encephalopathy with mental status change and confusion.  He was found to have hyponatremia (chronic problem) and an elevated ammonia level.  CXR = No evidence of active infiltration.  CT of abdomen likely ileus.  We have been asked to initiate empiric Cipro for him while he has ongoing work-up.  He has normal renal function with a creatinine of 0.79 and an estimated clearance > 50 ml/min.  He is obese at 111 kg and we will use standard dosing for his empiric regimen.  Goal of Therapy:  Therapeutic response to IV Cipro  Plan:  1.  Cipro 400 mg IV q12 hours 2.  Monitor renal function and adjust as needed 3.  Monitor culture data and possible antibiotic changes based on culture results  Tony Boyd, PharmD., MS Clinical Pharmacist Pager:  [phone] Thank you for allowing pharmacy to be part of this patients care team. 09/16/2012,11:37 PM

## 2012-09-16 NOTE — ED Notes (Signed)
IV team notified.

## 2012-09-17 ENCOUNTER — Inpatient Hospital Stay (HOSPITAL_COMMUNITY): Payer: Medicare Other

## 2012-09-17 ENCOUNTER — Non-Acute Institutional Stay (SKILLED_NURSING_FACILITY): Payer: Medicare Other | Admitting: Internal Medicine

## 2012-09-17 DIAGNOSIS — IMO0002 Reserved for concepts with insufficient information to code with codable children: Secondary | ICD-10-CM

## 2012-09-17 DIAGNOSIS — K7682 Hepatic encephalopathy: Secondary | ICD-10-CM

## 2012-09-17 DIAGNOSIS — F1096 Alcohol use, unspecified with alcohol-induced persisting amnestic disorder: Secondary | ICD-10-CM

## 2012-09-17 DIAGNOSIS — E1165 Type 2 diabetes mellitus with hyperglycemia: Secondary | ICD-10-CM

## 2012-09-17 DIAGNOSIS — G40909 Epilepsy, unspecified, not intractable, without status epilepticus: Secondary | ICD-10-CM

## 2012-09-17 DIAGNOSIS — K7290 Hepatic failure, unspecified without coma: Secondary | ICD-10-CM

## 2012-09-17 DIAGNOSIS — E1149 Type 2 diabetes mellitus with other diabetic neurological complication: Secondary | ICD-10-CM

## 2012-09-17 DIAGNOSIS — E1142 Type 2 diabetes mellitus with diabetic polyneuropathy: Secondary | ICD-10-CM

## 2012-09-17 LAB — BASIC METABOLIC PANEL
BUN: 13 mg/dL (ref 6–23)
BUN: 13 mg/dL (ref 6–23)
BUN: 11 mg/dL (ref 6–23)
CO2: 23 mEq/L (ref 19–32)
CO2: 25 mEq/L (ref 19–32)
Calcium: 8.5 mg/dL (ref 8.4–10.5)
Calcium: 8.6 mg/dL (ref 8.4–10.5)
Chloride: 89 mEq/L — ABNORMAL LOW (ref 96–112)
Chloride: 92 mEq/L — ABNORMAL LOW (ref 96–112)
Creatinine, Ser: 0.86 mg/dL (ref 0.50–1.35)
Creatinine, Ser: 0.81 mg/dL (ref 0.50–1.35)
Glucose, Bld: 155 mg/dL — ABNORMAL HIGH (ref 70–99)
Glucose, Bld: 115 mg/dL — ABNORMAL HIGH (ref 70–99)
Potassium: 3.6 mEq/L (ref 3.5–5.1)
Sodium: 120 mEq/L — ABNORMAL LOW (ref 135–145)

## 2012-09-17 LAB — CBC WITH DIFFERENTIAL/PLATELET
Basophils Absolute: 0 10*3/uL (ref 0.0–0.1)
Basophils Relative: 0 % (ref 0–1)
Eosinophils Absolute: 0.2 10*3/uL (ref 0.0–0.7)
Hemoglobin: 11.7 g/dL — ABNORMAL LOW (ref 13.0–17.0)
Lymphocytes Relative: 17 % (ref 12–46)
MCH: 36.8 pg — ABNORMAL HIGH (ref 26.0–34.0)
MCHC: 38.5 g/dL — ABNORMAL HIGH (ref 30.0–36.0)
Monocytes Absolute: 0.9 10*3/uL (ref 0.1–1.0)
Neutrophils Relative %: 64 % (ref 43–77)
Platelets: 41 10*3/uL — ABNORMAL LOW (ref 150–400)
RDW: 13.9 % (ref 11.5–15.5)

## 2012-09-17 LAB — GLUCOSE, CAPILLARY
Glucose-Capillary: 126 mg/dL — ABNORMAL HIGH (ref 70–99)
Glucose-Capillary: 100 mg/dL — ABNORMAL HIGH (ref 70–99)
Glucose-Capillary: 96 mg/dL (ref 70–99)
Glucose-Capillary: 154 mg/dL — ABNORMAL HIGH (ref 70–99)
Glucose-Capillary: 168 mg/dL — ABNORMAL HIGH (ref 70–99)

## 2012-09-17 LAB — CBC
HCT: 28.9 % — ABNORMAL LOW (ref 39.0–52.0)
Hemoglobin: 10.6 g/dL — ABNORMAL LOW (ref 13.0–17.0)
MCV: 97.3 fL (ref 78.0–100.0)
RBC: 2.97 MIL/uL — ABNORMAL LOW (ref 4.22–5.81)
WBC: 4.9 10*3/uL (ref 4.0–10.5)

## 2012-09-17 LAB — LIPASE, BLOOD: Lipase: 29 U/L (ref 11–59)

## 2012-09-17 LAB — SODIUM, URINE, RANDOM: Sodium, Ur: <10 mEq/L

## 2012-09-17 MED ORDER — LEVETIRACETAM 500 MG PO TABS
1000.0000 mg | ORAL_TABLET | Freq: Two times a day (BID) | ORAL | Status: DC
  Administered 2012-09-17 – 2012-09-23 (×12): 1000 mg via ORAL
  Filled 2012-09-17 (×14): qty 2

## 2012-09-17 NOTE — Progress Notes (Signed)
Utilization review completed.  P.J. Lydie Stammen,RN,BSN Case Manager [phone]  

## 2012-09-17 NOTE — Progress Notes (Signed)
[facility] Progress Note [place] TEAM 1 - Stepdown/ICU TEAM   Tony Boyd ZOX:[id number] DOB: May 25, 1955 DOA: 09/16/2012 PCP: [name], DO  Brief narrative: 57 y.o. male with history of cirrhosis of liver, hepatitis C, hypothyroidism, seizures was brought to the ER from SNF due to acute confusion. At the nursing facility  ammonia level was around 160. That level was repeated in the ER and was found to be around 63. He was also found to be hyponatremic. On exam he was alert awake, and oriented x3 and able to follow commands. He endorsed mild abdominal discomfort but denied any chest pain or shortness of breath. He also stated that he had some nausea vomiting the previous day. Patient was given IV fluids in the ER and lactulose and was admitted for further management.   Assessment/Plan:    Hepatic encephalopathy -wide discrepancy between ammonia level from SNF and level obtained in the ER but time/date of draw and possible change in treatment also unknown-ammonia at dc earlier this month was 31 -pt alert today and although confused he's actually closer to his baseline -cont Lactulose and Rifaximin-ammonia down to 33    Hyponatremia -urine Na+ was <10 which normally indicates DH but this lab is in the setting of diuretics prior to admit so may not be reliable -may be receiving too high of diuretic dose noting that prior to dc to SNF had been essentially unsupervised at an independent living facility and was quite noncompliant with his meds -on exam appears euvolemic so will monitor    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, acquired -stable and at baseline -no ascites on exam but since presented with abdominal pain will cont empiric coverage for SBP with Cipro -no fever or leukocytosis so doubt      Seizure disorder -possible that acute encephalopathy could be due to unwitnessed seizure activity/post ictal phase -cont IV Keppra -his serum level was  actually too high (63.5) - again now that taking meds at SNF may require dose adjustments    Hypothyroidism -TSH normal -cont Synthroid    Diabetes mellitus -cont Lantus and SSI   DVT prophylaxis: SCD's Code Status: Full Family Communication: Patient only Disposition Plan: Remain in SDU  Consultants: None  Procedures: None  Antibiotics: Cipro 7/23 >>>  HPI/Subjective: Pt alert but quite confused. Not able to provide a reliable history.   Objective: Blood pressure 124/76, pulse 94, temperature 98.7 F (37.1 C), temperature source Oral, resp. rate 15, height 5\' 7"  (1.702 m), weight 113.4 kg (250 lb), SpO2 100.00%.  Intake/Output Summary (Last 24 hours) at 09/17/12 1503 Last data filed at 09/17/12 0803  Gross per 24 hour  Intake      0 ml  Output    900 ml  Net   -900 ml     Exam: General: No acute respiratory distress Lungs: Clear to auscultation bilaterally without wheezes or crackles, RA Cardiovascular: Regular rate and rhythm without murmur gallop or rub normal S1 and S2, and surprisingly no peripheral edema or JVD Abdomen: Nontender, soft, bowel sounds positive, no rebound, no appreciable mass Musculoskeletal: No significant cyanosis, clubbing of bilateral lower extremities Neurological: Alert and oriented x 3, moves all extremities x 4 without focal neurological deficits, CN 2-12 intact  Scheduled Meds: Scheduled Meds: . ciprofloxacin  400 mg Intravenous Q12H  . ferrous sulfate  325 mg Oral QHS  . folic acid  1 mg Oral Daily  . insulin aspart  0-9 Units Subcutaneous TID WC  . insulin  glargine  60 Units Subcutaneous QHS  . lactulose  30 g Oral 5 X Daily  . levETIRAcetam  1,500 mg Intravenous Q12H  . levothyroxine  75 mcg Oral QAC breakfast  . pantoprazole  40 mg Oral Daily  . rifaximin  550 mg Oral BID  . risperiDONE  0.25 mg Oral BID  . sodium chloride  3 mL Intravenous Q12H  . sodium chloride  3 mL Intravenous Q12H    Data Reviewed: Basic  Metabolic Panel:  Recent Labs Lab 09/16/12 2040 09/17/12 0146 09/17/12 0518 09/17/12 1140  NA 118* 120* 121* 124*  K 4.4 4.0 3.6 3.8  CL 87* 89* 89* 92*  CO2 23 23 24 25   GLUCOSE 147* 155* 115* 97  BUN 13 13 13 11   CREATININE 0.79 0.79 0.86 0.81  CALCIUM 8.5 8.5 8.7 8.6   Liver Function Tests:  Recent Labs Lab 09/16/12 2040  AST 68*  ALT 49  ALKPHOS 204*  BILITOT 4.9*  PROT 6.3  ALBUMIN 2.2*    Recent Labs Lab 09/17/12 0146  LIPASE 29    Recent Labs Lab 09/16/12 2040 09/17/12 0518  AMMONIA 63* 33   CBC:  Recent Labs Lab 09/16/12 2040 09/17/12 0146 09/17/12 0518  WBC 4.9 5.5 4.9  NEUTROABS 3.5 3.5  --   HGB 10.6* 11.7* 10.6*  HCT 28.3* 30.4* 28.9*  MCV 95.6 95.6 97.3  PLT 39* 41* 42*   CBG:  Recent Labs Lab 09/17/12 0012 09/17/12 0830  GLUCAP 126* 100*    Recent Results (from the past 240 hour(s))  MRSA PCR SCREENING     Status: None   Collection Time    09/17/12 12:08 AM      Result Value Range Status   MRSA by PCR NEGATIVE  NEGATIVE Final   Comment:            The GeneXpert MRSA Assay (FDA     approved for NASAL specimens     only), is one component of a     comprehensive MRSA colonization     surveillance program. It is not     intended to diagnose MRSA     infection nor to guide or     monitor treatment for     MRSA infections.     Studies:  Recent x-ray studies have been reviewed in detail by the Attending Physician    Junious Silk, ANP [facility] Office  [phone] Pager [phone]  **If unable to reach the above provider after paging please contact the Flow Manager @ [phone]  On-Call/Text Page:      [email or web address]      password [email or web address]  If 7PM-7AM, please contact night-coverage [email or web address] Password [email or web address] 09/17/2012, 3:03 PM   LOS: 1 day   I have personally examined this patient and reviewed the entire database. I have reviewed the above note, made any necessary editorial changes, and agree with its  content.  Lonia Blood, MD [facility]

## 2012-09-17 NOTE — Progress Notes (Signed)
Patient ID: Usman Millett, male   DOB: 09-26-1955, 57 y.o.   MRN: [id number] Location:  [facility] Provider:  Elmarie Shiley L. Renato Gails, D.O., C.M.D. Date of visit:  09/16/12  Code Status:  Full code  Chief Complaint:  Increased confusion, off balance  HPI: 57 yo male with h/o alcoholic and hepatitis C induced cirrhosis was seen for an acute visit due to increased confusion, being off balance and vomiting.  His ammonia level was noted to be elevated.  Glucose has also been high.  When seen, he was very confused, not oriented to place or time.    Review of Systems:  Review of Systems  Constitutional: Negative for fever, chills and malaise/fatigue.  HENT: Negative for congestion.   Respiratory: Negative for shortness of breath.   Cardiovascular: Negative for chest pain.  Gastrointestinal: Positive for nausea, vomiting and abdominal pain. Negative for diarrhea, constipation, blood in stool and melena.  Genitourinary: Negative for dysuria, urgency and frequency.  Musculoskeletal: Positive for falls.  Skin: Negative for rash.  Neurological: Positive for dizziness. Negative for loss of consciousness.  Psychiatric/Behavioral: Positive for memory loss.     Medications: Patient's Medications  New Prescriptions   No medications on file  Previous Medications   ALBUTEROL (PROVENTIL HFA;VENTOLIN HFA) 108 (90 BASE) MCG/ACT INHALER    Inhale 2 puffs into the lungs 2 (two) times daily as needed for wheezing.    FERROUS SULFATE 325 (65 FE) MG TABLET    Take 325 mg by mouth at bedtime.   FOLIC ACID (FOLVITE) 1 MG TABLET    Take 1 mg by mouth daily.   FUROSEMIDE (LASIX) 80 MG TABLET    Take 80 mg by mouth 2 (two) times daily.   INSULIN ASPART (NOVOLOG) 100 UNIT/ML INJECTION    Inject 0-5 Units into the skin See admin instructions. 0-149 = 0 units; 150+ = 5 units 15 minutes before or 30 minutes after a meal.  Subcutaneously before meals for dm.   INSULIN GLARGINE (LANTUS) 100 UNIT/ML INJECTION     Inject 60 Units into the skin at bedtime.   LACTULOSE (CHRONULAC) 10 GM/15ML SOLUTION    Take 30 g by mouth 5 (five) times daily.   LEVETIRACETAM (KEPPRA) 750 MG TABLET    Take 1,500 mg by mouth 2 (two) times daily.   LEVOTHYROXINE (SYNTHROID, LEVOTHROID) 75 MCG TABLET    Take 75 mcg by mouth daily.   MULTIPLE VITAMIN (MULTIVITAMIN WITH MINERALS) TABS    Take 1 tablet by mouth daily.   OMEPRAZOLE (PRILOSEC) 20 MG CAPSULE    Take 20 mg by mouth daily.   OXYCODONE HCL 10 MG TABS    Take 10 mg by mouth every 4 (four) hours as needed (for pain).   POTASSIUM CHLORIDE (K-DUR) 10 MEQ TABLET    Take 10 mEq by mouth 2 (two) times daily.   RIFAXIMIN (XIFAXAN) 550 MG TABS    Take 550 mg by mouth 2 (two) times daily.   RISPERIDONE (RISPERDAL) 0.25 MG TABLET    Take 0.25 mg by mouth 2 (two) times daily.   SPIRONOLACTONE (ALDACTONE) 100 MG TABLET    Take 200 mg by mouth daily.  Modified Medications   No medications on file  Discontinued Medications   No medications on file    Physical Exam: There were no vitals filed for this visit. Physical Exam  Constitutional: No distress.  HENT:  Head: Normocephalic and atraumatic.  Eyes: Scleral icterus is present.  Cardiovascular: Normal rate, regular rhythm,  normal heart sounds and intact distal pulses.   Peripheral edema, chronic venous stasis changes present  Pulmonary/Chest: Effort normal and breath sounds normal. No respiratory distress.  Abdominal: Soft. Bowel sounds are normal. He exhibits distension. He exhibits no mass. There is no tenderness.  Ascites present  Musculoskeletal: Normal range of motion. He exhibits no edema and no tenderness.  Neurological: He is alert. No cranial nerve deficit.  Oriented only to person, + asterixis  Skin: Skin is warm and dry.     Labs reviewed: Basic Metabolic Panel:  Recent Labs  87/56/43 2100  09/16/12 2040 09/17/12 0146 09/17/12 0518  NA  --   < > 118* 120* 121*  K  --   < > 4.4 4.0 3.6  CL  --   <  > 87* 89* 89*  CO2  --   < > 23 23 24   GLUCOSE  --   < > 147* 155* 115*  BUN  --   < > 13 13 13   CREATININE  --   < > 0.79 0.79 0.86  CALCIUM  --   < > 8.5 8.5 8.7  MG 1.5  --   --   --   --   < > = values in this interval not displayed.  Liver Function Tests:  Recent Labs  08/26/12 0550 08/28/12 0558 09/16/12 2040  AST 111* 97* 68*  ALT 56* 51 49  ALKPHOS 191* 186* 204*  BILITOT 4.4* 3.3* 4.9*  PROT 6.3 6.3 6.3  ALBUMIN 2.0* 2.1* 2.2*    CBC:  Recent Labs  08/21/12 1229  09/16/12 2040 09/17/12 0146 09/17/12 0518  WBC 5.9  < > 4.9 5.5 4.9  NEUTROABS 5.1  --  3.5 3.5  --   HGB 11.6*  < > 10.6* 11.7* 10.6*  HCT 31.4*  < > 28.3* 30.4* 28.9*  MCV 99.7  < > 95.6 95.6 97.3  PLT 53*  < > 39* 41* 42*  < > = values in this interval not displayed. Labs reviewed from SNF:    Assessment/Plan 1. 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 Type II diabetes mellitus with neurological manifestations, uncontrolled -poorly controlled right now, requires acute workup to rule out gi bleed   4. Seizure disorder -stable, no evidence of seizure acutely, but could be heading that direction with his ammonia levels  Family/ staff Communication: I tried to call his daughter, Tamera Punt, but she did not answer--left a message to call the facility.   Goals of care: Full code Labs/tests ordered:  Sent to ED for further evaluation

## 2012-09-18 DIAGNOSIS — R188 Other ascites: Secondary | ICD-10-CM

## 2012-09-18 LAB — GLUCOSE, CAPILLARY: Glucose-Capillary: 144 mg/dL — ABNORMAL HIGH (ref 70–99)

## 2012-09-18 LAB — COMPREHENSIVE METABOLIC PANEL
Alkaline Phosphatase: 211 U/L — ABNORMAL HIGH (ref 39–117)
BUN: 12 mg/dL (ref 6–23)
CO2: 21 mEq/L (ref 19–32)
Calcium: 8.3 mg/dL — ABNORMAL LOW (ref 8.4–10.5)
GFR calc Af Amer: >90 mL/min (ref >90)
GFR calc non Af Amer: >90 mL/min (ref >90)
Glucose, Bld: 118 mg/dL — ABNORMAL HIGH (ref 70–99)
Total Protein: 6.4 g/dL (ref 6.0–8.3)

## 2012-09-18 LAB — CBC
HCT: 28.8 % — ABNORMAL LOW (ref 39.0–52.0)
Hemoglobin: 10.7 g/dL — ABNORMAL LOW (ref 13.0–17.0)
MCHC: 37.2 g/dL — ABNORMAL HIGH (ref 30.0–36.0)
RDW: 14.2 % (ref 11.5–15.5)
WBC: 5.3 10*3/uL (ref 4.0–10.5)

## 2012-09-18 MED ORDER — SODIUM CHLORIDE 0.9 % IV SOLN
INTRAVENOUS | Status: DC
  Administered 2012-09-18: 11:00:00 via INTRAVENOUS

## 2012-09-18 MED ORDER — CIPROFLOXACIN HCL 500 MG PO TABS
500.0000 mg | ORAL_TABLET | Freq: Once | ORAL | Status: AC
  Administered 2012-09-19: 500 mg via ORAL
  Filled 2012-09-18: qty 1

## 2012-09-18 NOTE — Progress Notes (Signed)
Pt's IV infiltrated.  Bedside RN assessed for IV restart, RN unable to restart.  IV team paged.  Attempted x 2 RNs with ultrasound, no success.  Dr. Butler Denmark notified, new orders received for PICC.  Roselie Awkward, RN

## 2012-09-18 NOTE — Progress Notes (Signed)
[facility] Progress Note  TEAM 1 - Stepdown/ICU TEAM   Tony Boyd FAO:[id number] DOB: [birth year] DOA: 09/16/2012 PCP: [name], DO  Brief narrative: 57 y.o. male with history of cirrhosis of liver, hepatitis C, hypothyroidism, seizures was brought to the ER from SNF due to acute confusion. At the nursing facility  ammonia level was around 160. That level was repeated in the ER and was found to be around 63. He was also found to be hyponatremic. On exam he was alert awake, and oriented x3 and able to follow commands. He endorsed mild abdominal discomfort but denied any chest pain or shortness of breath. He also stated that he had some nausea vomiting the previous day. Patient was given IV fluids in the ER and lactulose and was admitted for further management.   Assessment/Plan:    Hepatic encephalopathy -wide discrepancy between ammonia level from SNF and level obtained in the ER but time/date of draw and possible change in treatment also unknown-ammonia at dc earlier this month was 31 -pt alert today and although confused he's actually closer to his baseline -cont Lactulose and Rifaximin-ammonia down to 33    Hyponatremia -urine Na+ was <10 which normally indicates DH but this lab is in the setting of diuretics prior to admit so may not be reliable -Na+ decreased further this am to 119 so repeated urine Na+ which was still less than 10 so will start NS IVF @ 75/hr -may be receiving too high of diuretic dose noting that prior to dc to SNF had been essentially unsupervised at an independent living facility and was quite noncompliant with his meds -on exam appears euvolemic so will monitor    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, acquired -stable and at baseline -no ascites on exam but since presented with abdominal pain will cont empiric coverage for SBP with Cipro -no fever or leukocytosis so doubt      Seizure disorder -possible that acute  encephalopathy could be due to unwitnessed seizure activity/post ictal phase -cont IV Keppra -his serum level was actually too high (63.5) - again now that taking meds at SNF may require dose adjustments    Hypothyroidism -TSH normal -cont Synthroid    Diabetes mellitus -cont Lantus and SSI   DVT prophylaxis: SCD's Code Status: Full Family Communication: Patient only Disposition Plan: Remain in SDU  Consultants: None  Procedures: None  Antibiotics: Cipro 7/23 >>>  HPI/Subjective: Pt alert but quite confused. Not able to provide a reliable history. Alert and jocular and occasionally has accurate recollections of incidents during last admit (confirmed by staff)   Objective: Blood pressure 114/60, pulse 76, temperature 97.8 F (36.6 C), temperature source Oral, resp. rate 13, height 5\' 7"  (1.702 m), weight 113.4 kg (250 lb), SpO2 100.00%.  Intake/Output Summary (Last 24 hours) at 09/18/12 1338 Last data filed at 09/18/12 1130  Gross per 24 hour  Intake   2120 ml  Output   2850 ml  Net   -730 ml     Exam: General: No acute respiratory distress Lungs: Clear to auscultation bilaterally without wheezes or crackles, RA Cardiovascular: Regular rate and rhythm without murmur gallop or rub normal S1 and S2, and surprisingly no peripheral edema or JVD Abdomen: Nontender, soft, bowel sounds positive, no rebound, no appreciable mass Musculoskeletal: No significant cyanosis, clubbing of bilateral lower extremities Neurological: Alert and oriented x 3, moves all extremities x 4 without focal neurological deficits, CN 2-12 intact  Scheduled Meds: Scheduled Meds: . ciprofloxacin  400 mg Intravenous Q12H  . ferrous sulfate  325 mg Oral QHS  . folic acid  1 mg Oral Daily  . insulin aspart  0-9 Units Subcutaneous TID WC  . insulin glargine  60 Units Subcutaneous QHS  . levETIRAcetam  1,000 mg Oral BID  . levothyroxine  75 mcg Oral QAC breakfast  . pantoprazole  40 mg Oral Daily   . rifaximin  550 mg Oral BID  . risperiDONE  0.25 mg Oral BID  . sodium chloride  3 mL Intravenous Q12H    Data Reviewed: Basic Metabolic Panel:  Recent Labs Lab 09/16/12 2040 09/17/12 0146 09/17/12 0518 09/17/12 1140 09/18/12 0530  NA 118* 120* 121* 124* 119*  K 4.4 4.0 3.6 3.8 4.0  CL 87* 89* 89* 92* 90*  CO2 23 23 24 25 21   GLUCOSE 147* 155* 115* 97 118*  BUN 13 13 13 11 12   CREATININE 0.79 0.79 0.86 0.81 0.78  CALCIUM 8.5 8.5 8.7 8.6 8.3*   Liver Function Tests:  Recent Labs Lab 09/16/12 2040 09/18/12 0530  AST 68* 62*  ALT 49 50  ALKPHOS 204* 211*  BILITOT 4.9* 3.2*  PROT 6.3 6.4  ALBUMIN 2.2* 2.3*    Recent Labs Lab 09/17/12 0146  LIPASE 29    Recent Labs Lab 09/16/12 2040 09/17/12 0518  AMMONIA 63* 33   CBC:  Recent Labs Lab 09/16/12 2040 09/17/12 0146 09/17/12 0518 09/18/12 0530  WBC 4.9 5.5 4.9 5.3  NEUTROABS 3.5 3.5  --   --   HGB 10.6* 11.7* 10.6* 10.7*  HCT 28.3* 30.4* 28.9* 28.8*  MCV 95.6 95.6 97.3 98.0  PLT 39* 41* 42* 44*   CBG:  Recent Labs Lab 09/17/12 1151 09/17/12 1723 09/17/12 2130 09/18/12 0722 09/18/12 1132  GLUCAP 96 154* 168* 89 206*    Recent Results (from the past 240 hour(s))  MRSA PCR SCREENING     Status: None   Collection Time    09/17/12 12:08 AM      Result Value Range Status   MRSA by PCR NEGATIVE  NEGATIVE Final   Comment:            The GeneXpert MRSA Assay (FDA     approved for NASAL specimens     only), is one component of a     comprehensive MRSA colonization     surveillance program. It is not     intended to diagnose MRSA     infection nor to guide or     monitor treatment for     MRSA infections.     Studies:  Recent x-ray studies have been reviewed in detail by the Attending Physician    Junious Silk, ANP [facility] Office  [phone] Pager [phone]  **If unable to reach the above provider after paging please contact the Flow Manager @  [phone]  On-Call/Text Page:      [email or web address]      password [email or web address]  If 7PM-7AM, please contact night-coverage [email or web address] Password [facility] 09/18/2012, 1:38 PM   LOS: 2 days   I have examined the patient, reviewed the chart and modified the above note which I agree with.   Vidalia Serpas,MD [phone] 09/18/2012, 6:01 PM

## 2012-09-18 NOTE — Progress Notes (Signed)
CRITICAL VALUE ALERT  Critical value received:  [id number]  Date of notification:  09/18/12  Time of notification:  0709  Critical value read back:yes  Nurse who received alert:  Beryle Quant, RN  MD notified (1st page):  Junious Silk, NP  Time of first page:  0722  MD notified (2nd page):  Time of second page:  Responding MD:  Junious Silk, NP  Time MD responded:  [phone]

## 2012-09-18 NOTE — Progress Notes (Signed)
Clinical Social Work Department BRIEF PSYCHOSOCIAL ASSESSMENT 09/18/2012  Patient:  Tony Boyd, Tony Boyd     Account Number:  [id number]     Admit date:  09/16/2012  Clinical Social Worker:  Leron Croak, CLINICAL SOCIAL WORKER  Date/Time:  09/18/2012 10:41 AM  Referred by:  Physician  Date Referred:  09/17/2012 Referred for  SNF Placement   Other Referral:   Interview type:  Patient Other interview type:   CSW also spoke with the [facility] inhouse coordinator Tammy.    PSYCHOSOCIAL DATA Living Status:  FACILITY Admitted from facility:  [facility],  Level of care:   Primary support name:  [name]    [phone] Primary support relationship to patient:  CHILD, ADULT Degree of support available:   Pt has a good support system from facility and family    CURRENT CONCERNS Current Concerns  Post-Acute Placement   Other Concerns:    SOCIAL WORK ASSESSMENT / PLAN CSW met with the Pt at the bedside for d/c planning. Pt was familiar with this CSW and excited for the visist. CSW explained role for this assessment. Pt stated that he has been at [facility] and is agreeable to returning . Pt stated that someone (Tammy)  from the facility came by to see him recently. CSW informed Pt that Facility Rep/Adm Coordinator also spoke with me. Pt gave permission to prepare paperwork for his return and stated "he believes he will be leaving maybe on Monday". CSW will work on paperwork for d/c planning.   Assessment/plan status:  Information/Referral to [facility] Other assessment/ plan:   Information/referral to community resources:   No additional information needed at this time.    PATIENT'S/FAMILY'S RESPONSE TO PLAN OF CARE: Pt was happy to see CSW and appreciative for assistance returning to facility.       Leron Croak, LCSWA [facility] Emergency [facility].  [phone]

## 2012-09-19 LAB — GLUCOSE, CAPILLARY
Glucose-Capillary: 62 mg/dL — ABNORMAL LOW (ref 70–99)
Glucose-Capillary: 154 mg/dL — ABNORMAL HIGH (ref 70–99)

## 2012-09-19 LAB — BASIC METABOLIC PANEL
BUN: 9 mg/dL (ref 6–23)
Calcium: 8.2 mg/dL — ABNORMAL LOW (ref 8.4–10.5)
Chloride: 94 mEq/L — ABNORMAL LOW (ref 96–112)
Creatinine, Ser: 0.7 mg/dL (ref 0.50–1.35)
GFR calc Af Amer: >90 mL/min (ref >90)
GFR calc non Af Amer: >90 mL/min (ref >90)

## 2012-09-19 MED ORDER — CIPROFLOXACIN HCL 500 MG PO TABS
500.0000 mg | ORAL_TABLET | Freq: Two times a day (BID) | ORAL | Status: DC
  Administered 2012-09-19 – 2012-09-20 (×2): 500 mg via ORAL
  Filled 2012-09-19 (×4): qty 1

## 2012-09-19 MED ORDER — LACTULOSE 10 GM/15ML PO SOLN
30.0000 g | Freq: Every day | ORAL | Status: DC
  Administered 2012-09-19 – 2012-09-23 (×16): 30 g via ORAL
  Filled 2012-09-19 (×7): qty 45
  Filled 2012-09-19: qty 30
  Filled 2012-09-19 (×13): qty 45
  Filled 2012-09-19: qty 15
  Filled 2012-09-19 (×3): qty 45

## 2012-09-19 NOTE — Progress Notes (Signed)
ANTIBIOTIC CONSULT NOTE - follow up  Pharmacy Consult for Cipro Indication:  Possible SBP  Allergies  Allergen Reactions  . Ativan (Lorazepam) Other (See Comments)    "hallucinations"  . Droperidol Other (See Comments)    unknown  . Ketorolac Other (See Comments)    unknown  . Penicillins Swelling and Other (See Comments)    unkown  . Toradol (Ketorolac Tromethamine) Hives    Patient Measurements: Weight:  111.2 kg Height:  72 inches Ideal Body Weight (IBW): 77.60 kg Adjusted Body Weight: 91.0 kg Percent over ibw: 43.04 %  Vital Signs: Temp: 98.4 F (36.9 C) (07/25 0800) Temp src: Oral (07/25 0800) BP: 120/68 mmHg (07/25 0330) Pulse Rate: 102 (07/25 0330)  Labs:  Recent Labs  09/17/12 0146 09/17/12 0518 09/17/12 1140 09/18/12 0530 09/19/12 0730  WBC 5.5 4.9  --  5.3  --   HGB 11.7* 10.6*  --  10.7*  --   PLT 41* 42*  --  44*  --   CREATININE 0.79 0.86 0.81 0.78 0.70    Microbiology: Recent Results (from the past 720 hour(s))  CULTURE, BLOOD (ROUTINE X 2)     Status: None   Collection Time    08/21/12 12:50 PM      Result Value Range Status   Specimen Description BLOOD RIGHT FEMORAL ARTERY   Final   Special Requests BOTTLES DRAWN AEROBIC ONLY 5CC   Final   Culture  Setup Time 08/21/2012 16:39   Final   Culture NO GROWTH 5 DAYS   Final   Report Status 08/27/2012 FINAL   Final  URINE CULTURE     Status: None   Collection Time    08/21/12  1:43 PM      Result Value Range Status   Specimen Description URINE, CATHETERIZED   Final   Special Requests NONE   Final   Culture  Setup Time 08/21/2012 14:50   Final   Colony Count >=100,000 COLONIES/ML   Final   Culture     Final   Value: STAPHYLOCOCCUS SPECIES (COAGULASE NEGATIVE)     Note: RIFAMPIN AND GENTAMICIN SHOULD NOT BE USED AS SINGLE DRUGS FOR TREATMENT OF STAPH INFECTIONS.   Report Status 08/23/2012 FINAL   Final   Organism ID, Bacteria STAPHYLOCOCCUS SPECIES (COAGULASE NEGATIVE)   Final  MRSA PCR  SCREENING     Status: None   Collection Time    08/21/12  8:07 PM      Result Value Range Status   MRSA by PCR NEGATIVE  NEGATIVE Final   Comment:            The GeneXpert MRSA Assay (FDA     approved for NASAL specimens     only), is one component of a     comprehensive MRSA colonization     surveillance program. It is not     intended to diagnose MRSA     infection nor to guide or     monitor treatment for     MRSA infections.  MRSA PCR SCREENING     Status: None   Collection Time    08/25/12  8:45 PM      Result Value Range Status   MRSA by PCR NEGATIVE  NEGATIVE Final   Comment:            The GeneXpert MRSA Assay (FDA     approved for NASAL specimens     only), is one component of a     comprehensive  MRSA colonization     surveillance program. It is not     intended to diagnose MRSA     infection nor to guide or     monitor treatment for     MRSA infections.  MRSA PCR SCREENING     Status: None   Collection Time    09/17/12 12:08 AM      Result Value Range Status   MRSA by PCR NEGATIVE  NEGATIVE Final   Comment:            The GeneXpert MRSA Assay (FDA     approved for NASAL specimens     only), is one component of a     comprehensive MRSA colonization     surveillance program. It is not     intended to diagnose MRSA     infection nor to guide or     monitor treatment for     MRSA infections.   Assessment: 57 yo male admitted with acute encephalopathy with mental status change and confusion. On D#3 IV cipro for possible SBP. Renal function has been stable with scr = 0.7 and an estimated clearance > 90 ml/min. Afebrile, wbc = 5.3, no new cultures.  Goal of Therapy:  Therapeutic response to IV Cipro  Plan:  1.  Continue Cipro 400 mg IV q12 hours 2.  Monitor renal function and adjust as needed 3. F/u LOT  Bayard Hugger, PharmD, BCPS  Clinical Pharmacist  Pager: [phone]  Thank you for allowing pharmacy to be part of this patients care team. 09/19/2012,9:42  AM

## 2012-09-19 NOTE — Progress Notes (Signed)
Patient transferred to 6N by tech via wheelchair. All belongings and medications sent with patient. VSS.

## 2012-09-19 NOTE — Progress Notes (Addendum)
[facility] Progress Note [place] TEAM 1 - Stepdown/ICU TEAM   Tony Boyd UJW:[id number] DOB: 08-Jul-1955 DOA: 09/16/2012 PCP: [name], DO  Brief narrative: 57 y.o. male with history of cirrhosis of liver, hepatitis C, hypothyroidism, seizures was brought to the ER from SNF due to acute confusion. At the nursing facility  ammonia level was around 160. That level was repeated in the ER and was found to be around 63. He was also found to be hyponatremic. On exam he was alert awake, and oriented x3 and able to follow commands. He endorsed mild abdominal discomfort but denied any chest pain or shortness of breath. He also stated that he had some nausea vomiting the previous day. Patient was given IV fluids in the ER and lactulose and was admitted for further management.  Plan is to transfer to floor 7/25 - cont to monitor ammonia level, and Na, and mental status - begin PT/OT - with goal to be to return to SNF ~ Monday.   Assessment/Plan:    Hepatic encephalopathy -wide discrepancy between ammonia level from SNF and level obtained in the ER but time/date of draw and possible change in treatment also unknown -ammonia at dc earlier this month was 31 -pt alert today and although confused he's actually closer to his baseline -cont Lactulose and Rifaximin - ammonia down to 33 - RPT AMMONIA LEVEL 7/26    Hyponatremia -urine Na+ was <10 which normally indicates DH but this lab is in the setting of diuretics prior to admit so may not be reliable -Na+ decreased further to 119 so repeated urine Na+ which was still less than 10 so staredt NS IVF @ 75/hr >> Na up to 124 so will NSL -may be receiving too high of diuretic dose noting that prior to dc to SNF had been essentially unsupervised at an independent living facility and was quite noncompliant with his meds -on exam appears euvolemic so will monitor    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, acquired -stable and  at baseline -no ascites on exam but since presented with abdominal pain will cont empiric coverage for SBP with Cipro -no fever or leukocytosis so doubt      Seizure disorder -possible that acute encephalopathy could be due to unwitnessed seizure activity/post ictal phase -cont IV Keppra -his serum level was actually too high (63.5) - again now that taking meds at SNF may require dose adjustments -repeat level 7/26    Hypothyroidism -TSH normal -cont Synthroid    Diabetes mellitus -cont Lantus and SSI   DVT prophylaxis: SCD's Code Status: Full Family Communication: Patient only Disposition Plan: Transfer to Floor  Consultants: None  Procedures: None  Antibiotics: Cipro 7/23 >>>  HPI/Subjective: Pt alert but quite confused. Not able to provide a reliable history.   Objective: Blood pressure 114/61, pulse 86, temperature 98.5 F (36.9 C), temperature source Oral, resp. rate 20, height 5\' 7"  (1.702 m), weight 113.4 kg (250 lb), SpO2 98.00%.  Intake/Output Summary (Last 24 hours) at 09/19/12 1504 Last data filed at 09/19/12 1344  Gross per 24 hour  Intake    960 ml  Output   3550 ml  Net  -2590 ml     Exam: General: No acute respiratory distress Lungs: Clear to auscultation bilaterally without wheezes or crackles, RA Cardiovascular: Regular rate and rhythm without murmur gallop or rub normal S1 and S2, and surprisingly no peripheral edema or JVD Abdomen: Nontender, soft, bowel sounds positive, no rebound, no appreciable mass Musculoskeletal:  No significant cyanosis, clubbing of bilateral lower extremities Neurological: Alert and oriented x 3, moves all extremities x 4 without focal neurological deficits, CN 2-12 intact  Scheduled Meds: Scheduled Meds: . ciprofloxacin  400 mg Intravenous Q12H  . ferrous sulfate  325 mg Oral QHS  . folic acid  1 mg Oral Daily  . insulin aspart  0-9 Units Subcutaneous TID WC  . insulin glargine  60 Units Subcutaneous QHS  .  levETIRAcetam  1,000 mg Oral BID  . levothyroxine  75 mcg Oral QAC breakfast  . pantoprazole  40 mg Oral Daily  . rifaximin  550 mg Oral BID  . risperiDONE  0.25 mg Oral BID  . sodium chloride  3 mL Intravenous Q12H    Data Reviewed: Basic Metabolic Panel:  Recent Labs Lab 09/17/12 0146 09/17/12 0518 09/17/12 1140 09/18/12 0530 09/19/12 0730  NA 120* 121* 124* 119* 124*  K 4.0 3.6 3.8 4.0 4.2  CL 89* 89* 92* 90* 94*  CO2 23 24 25 21 24   GLUCOSE 155* 115* 97 118* 70  BUN 13 13 11 12 9   CREATININE 0.79 0.86 0.81 0.78 0.70  CALCIUM 8.5 8.7 8.6 8.3* 8.2*   Liver Function Tests:  Recent Labs Lab 09/16/12 2040 09/18/12 0530  AST 68* 62*  ALT 49 50  ALKPHOS 204* 211*  BILITOT 4.9* 3.2*  PROT 6.3 6.4  ALBUMIN 2.2* 2.3*    Recent Labs Lab 09/17/12 0146  LIPASE 29    Recent Labs Lab 09/16/12 2040 09/17/12 0518  AMMONIA 63* 33   CBC:  Recent Labs Lab 09/16/12 2040 09/17/12 0146 09/17/12 0518 09/18/12 0530  WBC 4.9 5.5 4.9 5.3  NEUTROABS 3.5 3.5  --   --   HGB 10.6* 11.7* 10.6* 10.7*  HCT 28.3* 30.4* 28.9* 28.8*  MCV 95.6 95.6 97.3 98.0  PLT 39* 41* 42* 44*   CBG:  Recent Labs Lab 09/18/12 1717 09/18/12 2139 09/19/12 0735 09/19/12 0807 09/19/12 1129  GLUCAP 128* 144* 62* 98 196*    Recent Results (from the past 240 hour(s))  MRSA PCR SCREENING     Status: None   Collection Time    09/17/12 12:08 AM      Result Value Range Status   MRSA by PCR NEGATIVE  NEGATIVE Final   Comment:            The GeneXpert MRSA Assay (FDA     approved for NASAL specimens     only), is one component of a     comprehensive MRSA colonization     surveillance program. It is not     intended to diagnose MRSA     infection nor to guide or     monitor treatment for     MRSA infections.     Studies:  Recent x-ray studies have been reviewed in detail by the Attending Physician    Junious Silk, ANP [facility] Office  [phone] Pager  [phone]  **If unable to reach the above provider after paging please contact the Flow Manager @ [phone]  On-Call/Text Page:      [email or web address]      password [email or web address]  If 7PM-7AM, please contact night-coverage [email or web address] Password [email or web address] 09/19/2012, 3:04 PM   LOS: 3 days    I have personally examined this patient and reviewed the entire database. I have reviewed the above note, made any necessary editorial changes, and agree with its content.  Lonia Blood, MD [facility]

## 2012-09-20 DIAGNOSIS — K7290 Hepatic failure, unspecified without coma: Secondary | ICD-10-CM

## 2012-09-20 DIAGNOSIS — G40909 Epilepsy, unspecified, not intractable, without status epilepticus: Secondary | ICD-10-CM

## 2012-09-20 LAB — BASIC METABOLIC PANEL
CO2: 24 mEq/L (ref 19–32)
Calcium: 8.1 mg/dL — ABNORMAL LOW (ref 8.4–10.5)
Chloride: 94 mEq/L — ABNORMAL LOW (ref 96–112)
Glucose, Bld: 106 mg/dL — ABNORMAL HIGH (ref 70–99)
Potassium: 4.2 mEq/L (ref 3.5–5.1)
Sodium: 123 mEq/L — ABNORMAL LOW (ref 135–145)

## 2012-09-20 LAB — AMMONIA: Ammonia: 64 umol/L — ABNORMAL HIGH (ref 11–60)

## 2012-09-20 LAB — GLUCOSE, CAPILLARY: Glucose-Capillary: 112 mg/dL — ABNORMAL HIGH (ref 70–99)

## 2012-09-20 MED ORDER — SPIRONOLACTONE 100 MG PO TABS
100.0000 mg | ORAL_TABLET | Freq: Every day | ORAL | Status: DC
  Administered 2012-09-20 – 2012-09-23 (×4): 100 mg via ORAL
  Filled 2012-09-20 (×4): qty 1

## 2012-09-20 MED ORDER — CIPROFLOXACIN HCL 500 MG PO TABS: 500.0000 mg | ORAL_TABLET | Freq: Every day | ORAL | Status: DC

## 2012-09-20 MED ORDER — DEMECLOCYCLINE HCL 150 MG PO TABS
300.0000 mg | ORAL_TABLET | Freq: Two times a day (BID) | ORAL | Status: DC
  Administered 2012-09-20 – 2012-09-22 (×6): 300 mg via ORAL
  Filled 2012-09-20 (×8): qty 2

## 2012-09-20 MED ORDER — FUROSEMIDE 80 MG PO TABS
80.0000 mg | ORAL_TABLET | Freq: Every day | ORAL | Status: DC
  Administered 2012-09-20 – 2012-09-23 (×4): 80 mg via ORAL
  Filled 2012-09-20 (×4): qty 1

## 2012-09-20 NOTE — Evaluation (Signed)
Physical Therapy Evaluation Patient Details Name: Tony Boyd MRN: [id number] DOB: 12-14-1955 Today's Date: 09/20/2012 Time: 4098-1191 PT Time Calculation (min): 23 min  PT Assessment / Plan / Recommendation History of Present Illness  Tony Boyd is a 57 y.o. male with history of cirrhosis of liver, hepatitis C, hypothyroidism, seizures was brought to the ER after patient was found to be confused at the nursing facility. In the nursing facility patient was found to have an ammonia level around 160 which was repeated here in the ER and was found to be around 63. In addition patient is also found to be hyponatremic. Patient on exam presently is alert awake oriented x3 and follows commands. Still has some mild abdominal discomfort but denies any chest pain or shortness of breath. Patient states that he had some nausea vomiting yesterday. Patient was given IV fluids in the ER and lactulose and has been admitted for further management  Clinical Impression  Pt appears to have decent mentation and is likely closing in on his baseline functioning.  There is some talk of him going to [facility] for medical reasons, but there is no reason for skilled PT at this time.  Signing off.    PT Assessment  Patent does not need any further PT services    Follow Up Recommendations  No PT follow up    Does the patient have the potential to tolerate intense rehabilitation      Barriers to Discharge        Equipment Recommendations  None recommended by PT    Recommendations for Other Services     Frequency      Precautions / Restrictions     Pertinent Vitals/Pain       Mobility  Bed Mobility Bed Mobility: Supine to Sit;Sitting - Scoot to Edge of Bed;Sit to Supine Supine to Sit: 7: Independent Sitting - Scoot to Edge of Bed: 7: Independent Sit to Supine: 6: Modified independent (Device/Increase time) Transfers Transfers: Sit to Stand;Stand to Sit Sit to Stand: 6: Modified independent  (Device/Increase time) Stand to Sit: 6: Modified independent (Device/Increase time) Ambulation/Gait Ambulation/Gait Assistance: 5: Supervision Ambulation Distance (Feet): 700 Feet Assistive device: None Ambulation/Gait Assistance Details: mildly unsteady overall, but generally uses the Rollator in open or outdoor spaces Gait Pattern: Step-through pattern Gait velocity: slower Stairs: Yes Stairs Assistance: 4: Min assist Stair Management Technique: One rail Right;Step to pattern;Forwards Number of Stairs: 3 Wheelchair Mobility Wheelchair Mobility: No    Exercises     PT Diagnosis:    PT Problem List:   PT Treatment Interventions:       PT Goals(Current goals can be found in the care plan section)    Visit Information  Last PT Received On: 09/20/12 Assistance Needed: +1 History of Present Illness: Tony Boyd is a 57 y.o. male with history of cirrhosis of liver, hepatitis C, hypothyroidism, seizures was brought to the ER after patient was found to be confused at the nursing facility. In the nursing facility patient was found to have an ammonia level around 160 which was repeated here in the ER and was found to be around 63. In addition patient is also found to be hyponatremic. Patient on exam presently is alert awake oriented x3 and follows commands. Still has some mild abdominal discomfort but denies any chest pain or shortness of breath. Patient states that he had some nausea vomiting yesterday. Patient was given IV fluids in the ER and lactulose and has been admitted for further management  Prior Functioning  Home Living Family/patient expects to be discharged to:: Skilled nursing facility Living Arrangements: Alone Additional Comments: Pt states his daughter brings his meds every day. Prior Function Level of Independence: Independent with assistive device(s) Comments: Pt states he uses nothing in home (but walls and furniture if needed), SPC or RW in  community Communication Communication: No difficulties Dominant Hand: Right    Cognition  Cognition Arousal/Alertness: Awake/alert Behavior During Therapy: WFL for tasks assessed/performed Overall Cognitive Status: Within Functional Limits for tasks assessed    Extremity/Trunk Assessment Upper Extremity Assessment Upper Extremity Assessment: Overall WFL for tasks assessed Lower Extremity Assessment Lower Extremity Assessment: Overall WFL for tasks assessed   Balance    End of Session PT - End of Session Activity Tolerance: Patient tolerated treatment well Patient left: in bed;with call bell/phone within reach Nurse Communication: Mobility status  GP     Cj Edgell, Eliseo Gum 09/20/2012, 2:18 PM 09/20/2012  [place], PT [phone]  (pager)

## 2012-09-20 NOTE — Progress Notes (Signed)
[facility] Progress Note [name] TEAM 1 - Stepdown/ICU TEAM   Tony Boyd WUJ:[id number] DOB: March 23, 1955 DOA: 09/16/2012 PCP: [name], DO  Brief narrative: 57 y.o. male with history of cirrhosis of liver, hepatitis C, hypothyroidism, seizures was brought to the ER from SNF due to acute confusion. At the nursing facility  ammonia level was around 160. That level was repeated in the ER and was found to be around 63. He was also found to be hyponatremic. On exam he was alert awake, and oriented x3 and able to follow commands. He endorsed mild abdominal discomfort but denied any chest pain or shortness of breath. He also stated that he had some nausea vomiting the previous day. Patient was given IV fluids in the ER and lactulose and was admitted for further management.  Plan is to transfer to floor 7/25 - cont to monitor ammonia level, and Na, and mental status - begin PT/OT - with goal to be to return to SNF ~ Monday.   Assessment/Plan:    Hepatic encephalopathy -wide discrepancy between ammonia level from SNF and level obtained in the ER but time/date of draw and possible change in treatment also unknown -ammonia at dc earlier this month was 31 -pt alert today and although confused he's actually closer to his baseline -cont Lactulose and Rifaximin - ammonia down to 33 - RPT AMMONIA LEVEL 7/26    Hyponatremia Chronic. 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 D/c cipro. Lowers seizure threshold      Seizure disorder Cont keppra    Hypothyroidism -TSH normal -cont Synthroid    Diabetes mellitus -cont Lantus and SSI   DVT prophylaxis: SCD's Code Status: Full Family Communication: Patient only Disposition Plan: Transfer to Floor  Consultants: None  Procedures: None  Antibiotics: Cipro 7/23 >>>  HPI/Subjective: confused  Objective: Blood pressure 118/66, pulse 90,  temperature 98.2 F (36.8 C), temperature source Oral, resp. rate 18, height 5\' 7"  (1.702 m), weight 115.1 kg (253 lb 12 oz), SpO2 99.00%.  Intake/Output Summary (Last 24 hours) at 09/20/12 1444 Last data filed at 09/20/12 0838  Gross per 24 hour  Intake      0 ml  Output   2150 ml  Net  -2150 ml     Exam: General:  Comfortable. Watching TV. Thinks he's been in hospital for 1 month. Thinks he knows me from [place] Lungs: Clear to auscultation bilaterally without wheezes or crackles, RA Cardiovascular: Regular rate and rhythm without murmur gallop or rub normal S1 and S2, and surprisingly no peripheral edema or JVD Abdomen: Nontender, soft, bowel sounds positive, no rebound, no appreciable mass Musculoskeletal: No significant cyanosis, clubbing of bilateral lower extremities  Scheduled Meds: Scheduled Meds: . ciprofloxacin  500 mg Oral BID  . ferrous sulfate  325 mg Oral QHS  . folic acid  1 mg Oral Daily  . insulin aspart  0-9 Units Subcutaneous TID WC  . insulin glargine  60 Units Subcutaneous QHS  . lactulose  30 g Oral 5 X Daily  . levETIRAcetam  1,000 mg Oral BID  . levothyroxine  75 mcg Oral QAC breakfast  . pantoprazole  40 mg Oral Daily  . rifaximin  550 mg Oral BID  . risperiDONE  0.25 mg Oral BID  . sodium chloride  3 mL Intravenous Q12H    Data Reviewed: Basic Metabolic Panel:  Recent Labs Lab 09/17/12 0518 09/17/12 1140 09/18/12 0530 09/19/12 0730 09/20/12  0515  NA 121* 124* 119* 124* 123*  K 3.6 3.8 4.0 4.2 4.2  CL 89* 92* 90* 94* 94*  CO2 24 25 21 24 24   GLUCOSE 115* 97 118* 70 106*  BUN 13 11 12 9 12   CREATININE 0.86 0.81 0.78 0.70 0.69  CALCIUM 8.7 8.6 8.3* 8.2* 8.1*   Liver Function Tests:  Recent Labs Lab 09/16/12 2040 09/18/12 0530  AST 68* 62*  ALT 49 50  ALKPHOS 204* 211*  BILITOT 4.9* 3.2*  PROT 6.3 6.4  ALBUMIN 2.2* 2.3*    Recent Labs Lab 09/17/12 0146  LIPASE 29    Recent Labs Lab 09/16/12 2040 09/17/12 0518  09/20/12 0515  AMMONIA 63* 33 64*   CBC:  Recent Labs Lab 09/16/12 2040 09/17/12 0146 09/17/12 0518 09/18/12 0530  WBC 4.9 5.5 4.9 5.3  NEUTROABS 3.5 3.5  --   --   HGB 10.6* 11.7* 10.6* 10.7*  HCT 28.3* 30.4* 28.9* 28.8*  MCV 95.6 95.6 97.3 98.0  PLT 39* 41* 42* 44*   CBG:  Recent Labs Lab 09/19/12 1129 09/19/12 1725 09/19/12 2148 09/20/12 0752 09/20/12 1157  GLUCAP 196* 154* 128* 112* 125*    Recent Results (from the past 240 hour(s))  MRSA PCR SCREENING     Status: None   Collection Time    09/17/12 12:08 AM      Result Value Range Status   MRSA by PCR NEGATIVE  NEGATIVE Final   Comment:            The GeneXpert MRSA Assay (FDA     approved for NASAL specimens     only), is one component of a     comprehensive MRSA colonization     surveillance program. It is not     intended to diagnose MRSA     infection nor to guide or     monitor treatment for     MRSA infections.     Crista Curb, M.D.   If 7PM-7AM, please contact night-coverage [email or web address] Password [email or web address] 09/20/2012, 2:44 PM   LOS: 4 days

## 2012-09-21 DIAGNOSIS — D6959 Other secondary thrombocytopenia: Secondary | ICD-10-CM

## 2012-09-21 LAB — BASIC METABOLIC PANEL
BUN: 10 mg/dL (ref 6–23)
CO2: 23 mEq/L (ref 19–32)
Glucose, Bld: 176 mg/dL — ABNORMAL HIGH (ref 70–99)
Potassium: 3.8 mEq/L (ref 3.5–5.1)
Sodium: 124 mEq/L — ABNORMAL LOW (ref 135–145)

## 2012-09-21 LAB — CBC
Hemoglobin: 11.2 g/dL — ABNORMAL LOW (ref 13.0–17.0)
MCH: 36.2 pg — ABNORMAL HIGH (ref 26.0–34.0)
MCHC: 37 g/dL — ABNORMAL HIGH (ref 30.0–36.0)
MCV: 98.1 fL (ref 78.0–100.0)
RBC: 3.09 MIL/uL — ABNORMAL LOW (ref 4.22–5.81)

## 2012-09-21 LAB — GLUCOSE, CAPILLARY
Glucose-Capillary: 164 mg/dL — ABNORMAL HIGH (ref 70–99)
Glucose-Capillary: 141 mg/dL — ABNORMAL HIGH (ref 70–99)
Glucose-Capillary: 161 mg/dL — ABNORMAL HIGH (ref 70–99)

## 2012-09-21 NOTE — Progress Notes (Signed)
[facility] Progress Note [place] TEAM 1 - Stepdown/ICU TEAM   Saxon Barich AVW:[id number] DOB: 09/24/1955 DOA: 09/16/2012 PCP: [name], DO  Brief narrative: 57 y.o. male with history of cirrhosis of liver, hepatitis C, hypothyroidism, seizures was brought to the ER from SNF due to acute confusion. At the nursing facility  ammonia level was around 160. That level was repeated in the ER and was found to be around 63. He was also found to be hyponatremic. On exam he was alert awake, and oriented x3 and able to follow commands. He endorsed mild abdominal discomfort but denied any chest pain or shortness of breath. He also stated that he had some nausea vomiting the previous day. Patient was given IV fluids in the ER and lactulose and was admitted for further management.  Plan is to transfer to floor 7/25 - cont to monitor ammonia level, and Na, and mental status - begin PT/OT - with goal to be to return to SNF ~ Monday.   Assessment/Plan:    Hepatic encephalopathy -wide discrepancy between ammonia level from SNF and level obtained in the ER but time/date of draw and possible change in treatment also unknown -ammonia at dc earlier this month was 31 Resolved    Hyponatremia Chronic. 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 D/c cipro. Lowers seizure threshold      Seizure disorder Cont keppra    Hypothyroidism -TSH normal -cont Synthroid    Diabetes mellitus -cont Lantus and SSI  Patient wants to go HOME today, not SNF.  He is currently alert and oriented, appropriate. Has capacity for informed consent.  If he has someone who can help with med administration and provide 24 hour supervision, he will be discharged, otherwise will have to sign out AMA.  DVT prophylaxis: SCD's Code Status: Full Family Communication: Patient only Disposition Plan: Transfer to  Floor  Consultants: None  Procedures: None  Antibiotics: Cipro 7/23 >>>  HPI/Subjective: Changed his mind about SNF. Remembers my name. Knows he's in the hospital for about a week.  Objective: Blood pressure 111/53, pulse 95, temperature 98.3 F (36.8 C), temperature source Oral, resp. rate 18, height 5\' 7"  (1.702 m), weight 114.5 kg (252 lb 6.8 oz), SpO2 99.00%.  Intake/Output Summary (Last 24 hours) at 09/21/12 1415 Last data filed at 09/21/12 0900  Gross per 24 hour  Intake    840 ml  Output   1400 ml  Net   -560 ml     Exam: General:  Comfortable. Watching TV. Alert, oriented x4 and appropriate Lungs: Clear to auscultation bilaterally without wheezes or crackles, RA Cardiovascular: Regular rate and rhythm without murmur gallop or rub normal S1 and S2, and surprisingly no peripheral edema or JVD Abdomen: Nontender, soft, bowel sounds positive, no rebound, no appreciable mass Musculoskeletal: No significant cyanosis, clubbing of bilateral lower extremities  Scheduled Meds: Scheduled Meds: . demeclocycline  300 mg Oral Q12H  . ferrous sulfate  325 mg Oral QHS  . folic acid  1 mg Oral Daily  . furosemide  80 mg Oral Daily  . insulin aspart  0-9 Units Subcutaneous TID WC  . insulin glargine  60 Units Subcutaneous QHS  . lactulose  30 g Oral 5 X Daily  . levETIRAcetam  1,000 mg Oral BID  . levothyroxine  75 mcg Oral QAC breakfast  . pantoprazole  40 mg Oral Daily  . rifaximin  550 mg Oral BID  .  risperiDONE  0.25 mg Oral BID  . sodium chloride  3 mL Intravenous Q12H  . spironolactone  100 mg Oral Daily    Data Reviewed: Basic Metabolic Panel:  Recent Labs Lab 09/17/12 1140 09/18/12 0530 09/19/12 0730 09/20/12 0515 09/21/12 0630  NA 124* 119* 124* 123* 124*  K 3.8 4.0 4.2 4.2 3.8  CL 92* 90* 94* 94* 96  CO2 25 21 24 24 23   GLUCOSE 97 118* 70 106* 176*  BUN 11 12 9 12 10   CREATININE 0.81 0.78 0.70 0.69 0.69  CALCIUM 8.6 8.3* 8.2* 8.1* 7.7*   Liver  Function Tests:  Recent Labs Lab 09/16/12 2040 09/18/12 0530  AST 68* 62*  ALT 49 50  ALKPHOS 204* 211*  BILITOT 4.9* 3.2*  PROT 6.3 6.4  ALBUMIN 2.2* 2.3*    Recent Labs Lab 09/17/12 0146  LIPASE 29    Recent Labs Lab 09/16/12 2040 09/17/12 0518 09/20/12 0515  AMMONIA 63* 33 64*   CBC:  Recent Labs Lab 09/16/12 2040 09/17/12 0146 09/17/12 0518 09/18/12 0530 09/21/12 0630  WBC 4.9 5.5 4.9 5.3 3.4*  NEUTROABS 3.5 3.5  --   --   --   HGB 10.6* 11.7* 10.6* 10.7* 11.2*  HCT 28.3* 30.4* 28.9* 28.8* 30.3*  MCV 95.6 95.6 97.3 98.0 98.1  PLT 39* 41* 42* 44* 32*   CBG:  Recent Labs Lab 09/20/12 1157 09/20/12 1717 09/20/12 2111 09/21/12 0826 09/21/12 1156  GLUCAP 125* 125* 205* 164* 141*    Recent Results (from the past 240 hour(s))  MRSA PCR SCREENING     Status: None   Collection Time    09/17/12 12:08 AM      Result Value Range Status   MRSA by PCR NEGATIVE  NEGATIVE Final   Comment:            The GeneXpert MRSA Assay (FDA     approved for NASAL specimens     only), is one component of a     comprehensive MRSA colonization     surveillance program. It is not     intended to diagnose MRSA     infection nor to guide or     monitor treatment for     MRSA infections.     Crista Curb, M.D.   If 7PM-7AM, please contact night-coverage [email or web address] Password [email or web address] 09/21/2012, 2:15 PM   LOS: 5 days

## 2012-09-22 ENCOUNTER — Telehealth: Payer: Self-pay | Admitting: Geriatric Medicine

## 2012-09-22 ENCOUNTER — Encounter (HOSPITAL_COMMUNITY): Payer: Self-pay

## 2012-09-22 DIAGNOSIS — E039 Hypothyroidism, unspecified: Secondary | ICD-10-CM

## 2012-09-22 LAB — GLUCOSE, CAPILLARY
Glucose-Capillary: 164 mg/dL — ABNORMAL HIGH (ref 70–99)
Glucose-Capillary: 224 mg/dL — ABNORMAL HIGH (ref 70–99)
Glucose-Capillary: 176 mg/dL — ABNORMAL HIGH (ref 70–99)

## 2012-09-22 LAB — BASIC METABOLIC PANEL
Calcium: 7.9 mg/dL — ABNORMAL LOW (ref 8.4–10.5)
GFR calc Af Amer: >90 mL/min (ref >90)
GFR calc non Af Amer: >90 mL/min (ref >90)
Potassium: 3.6 mEq/L (ref 3.5–5.1)
Sodium: 124 mEq/L — ABNORMAL LOW (ref 135–145)

## 2012-09-22 MED ORDER — LEVETIRACETAM 1000 MG PO TABS: 1000.0000 mg | ORAL_TABLET | Freq: Two times a day (BID) | ORAL | Status: DC

## 2012-09-22 MED ORDER — RISPERIDONE 0.25 MG PO TABS: 0.2500 mg | ORAL_TABLET | Freq: Two times a day (BID) | ORAL | Status: DC

## 2012-09-22 MED ORDER — POTASSIUM CHLORIDE ER 10 MEQ PO TBCR: 10.0000 meq | EXTENDED_RELEASE_TABLET | Freq: Every day | ORAL | Status: DC

## 2012-09-22 MED ORDER — SPIRONOLACTONE 100 MG PO TABS: 100.0000 mg | ORAL_TABLET | Freq: Every day | ORAL | Status: DC

## 2012-09-22 MED ORDER — FUROSEMIDE 80 MG PO TABS: 80.0000 mg | ORAL_TABLET | Freq: Every day | ORAL | Status: DC

## 2012-09-22 MED ORDER — LACTULOSE 10 GM/15ML PO SOLN: 30.0000 g | Freq: Every day | ORAL | Status: DC

## 2012-09-22 NOTE — Clinical Social Work Note (Addendum)
Clinical Social Worker followed up with daughter, who reported that patient will return back to [facility] SNF today. CSW will complete discharge packet and place with shadow chart. Patient will be transported via ambulance. CSW will sign off, as social work intervention is no longer needed.   CSW was made aware that the pasarr # that was assigned to patient was cancelled. CSW submitted for a new number. CSW informed MD. Patient cannot transfer to facility without this number. CSW updated daughter.   Rozetta Nunnery MSW, [employer] [phone]

## 2012-09-22 NOTE — Telephone Encounter (Signed)
Patient has been at [facility]. He has been in [facility] hospital for a week. He is about to get discharged back to [facility]. He says he is signing an AMA form and is going to go home. He said the only way he would not have to sign one is to get his daughter to go along with it, per Dr. Lendell Caprice. He would like for you to agree with him on this. I advised him to stay in the facility. He said he is getting his walker and is going to walk home 6 miles in the heat. He is ready to go!

## 2012-09-22 NOTE — Evaluation (Signed)
Occupational Therapy Evaluation Patient Details Name: Tony Boyd MRN: [id number] DOB: 1955-06-09 Today's Date: 09/22/2012 Time: 7829-5621 OT Time Calculation (min): 25 min  OT Assessment / Plan / Recommendation History of present illness Tony Boyd is a 57 y.o. male with history of cirrhosis of liver, hepatitis C, hypothyroidism, seizures was brought to the ER after patient was found to be confused at the nursing facility. In the nursing facility patient was found to have an ammonia level around 160 which was repeated here in the ER and was found to be around 63. In addition patient is also found to be hyponatremic. Patient on exam presently is alert awake oriented x3 and follows commands. Still has some mild abdominal discomfort but denies any chest pain or shortness of breath. Patient states that he had some nausea vomiting yesterday. Patient was given IV fluids in the ER and lactulose and has been admitted for further management   Clinical Impression   Patient evaluated by Occupational Therapy with no further acute OT needs identified.  Pt for discharge today.  Pt is fixated that he is signing out AMA and walking home.  Pt is likely close to his baseline level of functioning.  He is able to perform BADLs at modified independent level; however, he does demonstrate impaired safety/judgement and impaired problem solving and executive function skills.  Pt got lost on unit, left unit and went to 6500 (with therapist) with no awareness of his error, and was unable to self correct - required intervention from OT.  Pt is insistent he is discharging to his apt at the [facility].  If this is indeed his plan, I recommend a HHOT eval to ensure he is safe with IADLs.   Also, optimally recommend 24 hour supervision initially to insure successful transition back into home environment, but I doubt this is available.  No further acute OT needs identified due to scheduled discharge today.     OT Assessment  All  further OT needs can be met in the next venue of care    Follow Up Recommendations  Home health OT;Supervision/Assistance - 24 hour (24 hour assist initially to ensure safe transition)    Barriers to Discharge      Equipment Recommendations  None recommended by OT    Recommendations for Other Services    Frequency       Precautions / Restrictions     Pertinent Vitals/Pain     ADL  Eating/Feeding: Independent Where Assessed - Eating/Feeding: Chair;Edge of bed Grooming: Set up;Wash/dry hands;Teeth care Where Assessed - Grooming: Unsupported standing Upper Body Bathing: Set up Where Assessed - Upper Body Bathing: Unsupported sitting;Unsupported standing Lower Body Bathing: Supervision/safety;Set up Where Assessed - Lower Body Bathing: Unsupported sit to stand Upper Body Dressing: Set up Where Assessed - Upper Body Dressing: Unsupported sitting;Unsupported standing Lower Body Dressing: Set up Where Assessed - Lower Body Dressing: Unsupported sit to stand Toilet Transfer: Modified independent Toilet Transfer Method: Sit to stand;Stand pivot Acupuncturist: Comfort height toilet Toileting - Clothing Manipulation and Hygiene: Modified independent Where Assessed - Toileting Clothing Manipulation and Hygiene: Standing Transfers/Ambulation Related to ADLs: modified independent ADL Comments: Pt with decreased judgement and problem solving.  he is able to perform BADLs, but needs supervision for IADLs    OT Diagnosis: Generalized weakness;Cognitive deficits  OT Problem List: Decreased cognition;Decreased safety awareness OT Treatment Interventions: Self-care/ADL training;DME and/or AE instruction;Cognitive remediation/compensation   OT Goals(Current goals can be found in the care plan section) Acute Rehab OT Goals  Patient Stated Goal: to go home  Visit Information  Last OT Received On: 09/22/12 Assistance Needed: +1 History of Present Illness: Tony Boyd is a 57  y.o. male with history of cirrhosis of liver, hepatitis C, hypothyroidism, seizures was brought to the ER after patient was found to be confused at the nursing facility. In the nursing facility patient was found to have an ammonia level around 160 which was repeated here in the ER and was found to be around 63. In addition patient is also found to be hyponatremic. Patient on exam presently is alert awake oriented x3 and follows commands. Still has some mild abdominal discomfort but denies any chest pain or shortness of breath. Patient states that he had some nausea vomiting yesterday. Patient was given IV fluids in the ER and lactulose and has been admitted for further management       Prior Functioning     Home Living Family/patient expects to be discharged to:: Private residence Living Arrangements: Alone Type of Home: Independent living facility Home Access: Level entry Home Layout: One level Home Equipment: Walker - 4 wheels;Tub bench Additional Comments: Pt reports that dtr assisted him with groceries and erraands prior to going to SNF.  he is unsure if she is going to continue to do so Prior Function Level of Independence: Independent with assistive device(s) Communication Communication: No difficulties Dominant Hand: Right         Vision/Perception     Cognition  Cognition Arousal/Alertness: Awake/alert Behavior During Therapy: WFL for tasks assessed/performed Overall Cognitive Status: No family/caregiver present to determine baseline cognitive functioning Area of Impairment: Safety/judgement;Problem solving;Awareness Current Attention Level: Selective Safety/Judgement: Decreased awareness of safety;Decreased awareness of deficits Awareness: Intellectual Problem Solving: Requires verbal cues General Comments: Pt is fixated that he is leaving hospital AMA, and will be walking home today.  Pt talking constantly about his plan.  Pt ambulated on unit, and became lost walking  off the unit and into another section of the hospital with no awareness of his error until pointed out by therapist.  Pt. unable to provide accurate directions on how to get to his apt.  Unsure of pt's baseline functioning, but anticipate, based on notes he is close to his baseline.      Extremity/Trunk Assessment Upper Extremity Assessment Upper Extremity Assessment: Overall WFL for tasks assessed Cervical / Trunk Assessment Cervical / Trunk Assessment: Normal     Mobility Bed Mobility Bed Mobility: Supine to Sit;Sitting - Scoot to Edge of Bed Supine to Sit: 7: Independent Sitting - Scoot to [place] of Bed: 7: Independent Transfers Transfers: Sit to Stand;Stand to Sit Sit to Stand: 6: Modified independent (Device/Increase time) Stand to Sit: 6: Modified independent (Device/Increase time)     Exercise     Balance     End of Session OT - End of Session Activity Tolerance: Patient tolerated treatment well Patient left: in chair;with call bell/phone within reach  GO     Sontee Desena M 09/22/2012, 12:35 PM

## 2012-09-22 NOTE — Discharge Summary (Signed)
Physician Discharge Summary  Tony Boyd WUJ:[id number] DOB: 1955/05/24 DOA: 09/16/2012  PCP: [name], DO  Admit date: 09/16/2012 Discharge date: 09/22/2012  Time spent: 60 minutes  Discharge Diagnoses:  Active Problems:   Hepatic encephalopathy   Hepatitis C   Coagulopathy   Hyponatremia, chronic   Cirrhosis   Seizure disorder   Thrombocytopenia, acquired   Hypothyroidism   Diabetes mellitus Obesity  Discharge Condition: stable  Filed Weights   09/17/12 0500 09/20/12 0647 09/21/12 0605  Weight: 113.4 kg (250 lb) 115.1 kg (253 lb 12 oz) 114.5 kg (252 lb 6.8 oz)    History of present illness:  Tony Boyd is a 57 y.o. male with history of cirrhosis of liver, hepatitis C, hypothyroidism, seizures was brought to the ER after patient was found to be confused at the nursing facility. In the nursing facility patient was found to have an ammonia level around 160 which was repeated here in the ER and was found to be around 63. In addition patient is also found to be hyponatremic, which is chronic. Patient on exam presently is alert awake oriented x3 and follows commands. Still has some mild abdominal discomfort but denies any chest pain or shortness of breath. Patient states that he had some nausea vomiting yesterday. Patient was given IV fluids in the ER and lactulose and has been admitted for further management.  Hospital Course:  The patient was admitted to the hospitalist service. Started back on lactulose, Xifaxan. His hepatic encephalopathy cleared and his ammonia levels are now normal. He was maintained on fluid restriction for his chronic hyponatremia. He was started on demeclocycline for this as well. His sodium today is 124 which is about his baseline. TSH was checked and is normal. He had been on Keppra 1500 mg by mouth twice a day. His level was high and this was decreased to 1000 mg twice daily in case it was contributing to somnolence. Diabetes was well-controlled. He has  chronic thrombocytopenia and coagulopathy from his cirrhosis. No evidence of SBP. Patient is medically stable and will hopefully go back to skilled nursing facility. This will be the safest place for him, but  Currently the patient and his daughter are trying to the side with her she will go back to SNF or home. Patient is currently lucid, appropriate, and does have capacity to make informed medical decisions  Consultations:  SW  Discharge Exam: Filed Vitals:   09/21/12 0605 09/21/12 1405 09/21/12 2206 09/22/12 0527  BP: 111/53 115/64 121/57 100/57  Pulse: 95 99 88 87  Temp: 98.3 F (36.8 C) 98.6 F (37 C) 98.3 F (36.8 C) 98 F (36.7 C)  TempSrc: Oral Oral Oral   Resp: 18 18 18 18   Height:      Weight: 114.5 kg (252 lb 6.8 oz)     SpO2: 99% 98% 100% 100%    General: Alert. Oriented. Appropriate.  Cardiovascular: regular rate rhythm without murmurs gallops rubs  Respiratory: clear to auscultation bilaterally without wheeze rhonchi around   extremities no edema Abdomen soft nontender nondistended  Discharge Instructions  Discharge Orders   Future Orders Complete By Expires     Discharge instructions  As directed     Comments:      Hepatic diet. Fluid restriction 1200 cc/day. Monitor daily weights. Monitor basic metabolic panel periodically        Medication List    STOP taking these medications       multivitamin with minerals Tabs  TAKE these medications       albuterol 108 (90 BASE) MCG/ACT inhaler  Commonly known as:  PROVENTIL HFA;VENTOLIN HFA  Inhale 2 puffs into the lungs 2 (two) times daily as needed for wheezing.     ferrous sulfate 325 (65 FE) MG tablet  Take 325 mg by mouth at bedtime.     folic acid 1 MG tablet  Commonly known as:  FOLVITE  Take 1 mg by mouth daily.     furosemide 80 MG tablet  Commonly known as:  LASIX  Take 1 tablet (80 mg total) by mouth daily.     insulin aspart 100 UNIT/ML injection  Commonly known as:  novoLOG   Inject 0-5 Units into the skin See admin instructions. 0-149 = 0 units; 150+ = 5 units 15 minutes before or 30 minutes after a meal.  Subcutaneously before meals for dm.     insulin glargine 100 UNIT/ML injection  Commonly known as:  LANTUS  Inject 60 Units into the skin at bedtime.     lactulose 10 GM/15ML solution  Commonly known as:  CHRONULAC  Take 45 mLs (30 g total) by mouth 5 (five) times daily. Hold if more than 5 stools in one day     levETIRAcetam 1000 MG tablet  Commonly known as:  KEPPRA  Take 1 tablet (1,000 mg total) by mouth 2 (two) times daily.     levothyroxine 75 MCG tablet  Commonly known as:  SYNTHROID, LEVOTHROID  Take 75 mcg by mouth daily.     omeprazole 20 MG capsule  Commonly known as:  PRILOSEC  Take 20 mg by mouth daily.     Oxycodone HCl 10 MG Tabs  Take 10 mg by mouth every 4 (four) hours as needed (for pain).     potassium chloride 10 MEQ tablet  Commonly known as:  K-DUR  Take 1 tablet (10 mEq total) by mouth daily.     rifaximin 550 MG Tabs  Commonly known as:  XIFAXAN  Take 550 mg by mouth 2 (two) times daily.     risperiDONE 0.25 MG tablet  Commonly known as:  RISPERDAL  Take 1 tablet (0.25 mg total) by mouth 2 (two) times daily. Hold for sedation     spironolactone 100 MG tablet  Commonly known as:  ALDACTONE  Take 1 tablet (100 mg total) by mouth daily.       Allergies  Allergen Reactions  . Ativan (Lorazepam) Other (See Comments)    "hallucinations"  . Droperidol Other (See Comments)    unknown  . Ketorolac Other (See Comments)    unknown  . Penicillins Swelling and Other (See Comments)    unkown  . Toradol (Ketorolac Tromethamine) Hives      The results of significant diagnostics from this hospitalization (including imaging, microbiology, ancillary and laboratory) are listed below for reference.    Significant Diagnostic Studies: Ct Head Wo Contrast  09/16/2012   *RADIOLOGY REPORT*  Clinical Data: Seizures.   Evaluate for encephalopathy.  CT HEAD WITHOUT CONTRAST  Technique:  Contiguous axial images were obtained from the base of the skull through the vertex without contrast.  Comparison: 08/21/2012  Findings: The ventricles and sulci are symmetrical without significant effacement, displacement, or dilatation. No mass effect or midline shift. No abnormal extra-axial fluid collections. The grey-white matter junction is distinct. Basal cisterns are not effaced. No acute intracranial hemorrhage. No depressed skull fractures.  Visualized paranasal sinuses and mastoid air cells are not opacified. No significant  change since previous study.  IMPRESSION: No acute intracranial abnormalities identified.   Original Report Authenticated By: Burman Nieves, M.D.   US Abdomen Complete  09/17/2012   *RADIOLOGY REPORT*  Clinical Data:  Abnormal liver function tests, abdominal pain.  ABDOMINAL ULTRASOUND COMPLETE  Comparison:  CT scan of September 11, 2010.  Findings:  Gallbladder:  Status post cholecystectomy.  Common Bile Duct:  Measures 7 mm which is within normal limits in caliber.  Liver: The liver is heterogeneous in echogenicity with nodular margins consistent with hepatic cirrhosis.18 mm oval shaped cyst is seen in right hepatic lobe.  IVC:  Visualized portion appears normal.  Pancreas:  Not well visualized due to overlying bowel gas.  Spleen:  Measures 13 x 17 x 6.4 cm with calculated volume of 730 cubic centimeters; this is consistent with mild splenomegaly.  Right kidney:  Measures 11.8 cm in length. Normal in size and parenchymal echogenicity.  No evidence of mass or hydronephrosis.  Left kidney:  4.7 cm cyst is seen in upper pole. Measures 10.9 cm in length. Normal in size and parenchymal echogenicity.  No evidence of mass or hydronephrosis.  Abdominal Aorta:  Not well visualized due to overlying bowel gas.  IMPRESSION: Hepatic cirrhosis.  Status post cholecystectomy.  Mild splenomegaly.  Left renal cyst.  No other  abnormality seen in the abdomen.   Original Report Authenticated By: Lupita Raider.,  M.D.   Dg Chest Port 1 View  09/16/2012   *RADIOLOGY REPORT*  Clinical Data: Altered mental status.  Mid abdominal pain.  PORTABLE CHEST - 1 VIEW  Comparison: 08/21/2012  Findings: Normal heart size and pulmonary vascularity. Peribronchial thickening and central interstitial changes suggesting chronic bronchitis.  No focal airspace disease or consolidation in the lungs.  No blunting of costophrenic angles. No pneumothorax.  Mediastinal contours appear intact.  No significant change since previous study.  IMPRESSION: Chronic bronchitic changes.  No evidence of active infiltration.   Original Report Authenticated By: Burman Nieves, M.D.   Dg Abd Portable 1v  09/16/2012   *RADIOLOGY REPORT*  Clinical Data: Abdominal pain.  Altered mental status.  PORTABLE ABDOMEN - 1 VIEW  Comparison: CT abdomen and pelvis 09/11/2010  Findings: The entire abdomen is not included within the field of view, limiting its evaluation.  Diffuse prominence of gas-filled mid abdominal small bowel and colon. Similar changes were demonstrated on the previous CT abdomen.  Findings likely to represent ileus.  No radiopaque stones visualized.  Degenerative changes in the lumbar spine.  IMPRESSION: Prominent gas distended small and large bowel throughout the abdomen most suggestive of ileus.   Original Report Authenticated By: Burman Nieves, M.D.    Microbiology: Recent Results (from the past 240 hour(s))  MRSA PCR SCREENING     Status: None   Collection Time    09/17/12 12:08 AM      Result Value Range Status   MRSA by PCR NEGATIVE  NEGATIVE Final   Comment:            The GeneXpert MRSA Assay (FDA     approved for NASAL specimens     only), is one component of a     comprehensive MRSA colonization     surveillance program. It is not     intended to diagnose MRSA     infection nor to guide or     monitor treatment for     MRSA  infections.     Labs: Basic Metabolic Panel:  Recent Labs Lab 09/18/12  0530 09/19/12 0730 09/20/12 0515 09/21/12 0630 09/22/12 0600  NA 119* 124* 123* 124* 124*  K 4.0 4.2 4.2 3.8 3.6  CL 90* 94* 94* 96 95*  CO2 21 24 24 23 23   GLUCOSE 118* 70 106* 176* 72  BUN 12 9 12 10 8   CREATININE 0.78 0.70 0.69 0.69 0.67  CALCIUM 8.3* 8.2* 8.1* 7.7* 7.9*   Liver Function Tests:  Recent Labs Lab 09/16/12 2040 09/18/12 0530  AST 68* 62*  ALT 49 50  ALKPHOS 204* 211*  BILITOT 4.9* 3.2*  PROT 6.3 6.4  ALBUMIN 2.2* 2.3*    Recent Labs Lab 09/17/12 0146  LIPASE 29    Recent Labs Lab 09/16/12 2040 09/17/12 0518 09/20/12 0515  AMMONIA 63* 33 64*   CBC:  Recent Labs Lab 09/16/12 2040 09/17/12 0146 09/17/12 0518 09/18/12 0530 09/21/12 0630  WBC 4.9 5.5 4.9 5.3 3.4*  NEUTROABS 3.5 3.5  --   --   --   HGB 10.6* 11.7* 10.6* 10.7* 11.2*  HCT 28.3* 30.4* 28.9* 28.8* 30.3*  MCV 95.6 95.6 97.3 98.0 98.1  PLT 39* 41* 42* 44* 32*   Cardiac Enzymes: No results found for this basename: CKTOTAL, CKMB, CKMBINDEX, TROPONINI,  in the last 168 hours BNP: BNP (last 3 results) No results found for this basename: PROBNP,  in the last 8760 hours CBG:  Recent Labs Lab 09/21/12 0826 09/21/12 1156 09/21/12 1728 09/21/12 2203 09/22/12 0747  GLUCAP 164* 141* 161* 178* 69*   Signed:  Gurvir Schrom L  [facility] 09/22/2012, 11:17 AM

## 2012-09-22 NOTE — Telephone Encounter (Signed)
Should go back to [facility].  He needs increased assistance.  His daughter should agree with this.

## 2012-09-23 ENCOUNTER — Other Ambulatory Visit: Payer: Self-pay | Admitting: Geriatric Medicine

## 2012-09-23 LAB — LEVETIRACETAM LEVEL: Levetiracetam Lvl: 32.7 ug/mL — ABNORMAL HIGH (ref 5.0–30.0)

## 2012-09-23 LAB — GLUCOSE, CAPILLARY
Glucose-Capillary: 105 mg/dL — ABNORMAL HIGH (ref 70–99)
Glucose-Capillary: 152 mg/dL — ABNORMAL HIGH (ref 70–99)

## 2012-09-23 LAB — BASIC METABOLIC PANEL
CO2: 27 mEq/L (ref 19–32)
Calcium: 7.9 mg/dL — ABNORMAL LOW (ref 8.4–10.5)
Chloride: 92 mEq/L — ABNORMAL LOW (ref 96–112)
Creatinine, Ser: 0.78 mg/dL (ref 0.50–1.35)
Glucose, Bld: 135 mg/dL — ABNORMAL HIGH (ref 70–99)

## 2012-09-23 MED ORDER — OXYCODONE HCL 10 MG PO TABS: 10.0000 mg | ORAL_TABLET | ORAL | Status: DC | PRN

## 2012-09-23 MED ORDER — DEMECLOCYCLINE HCL 150 MG PO TABS
300.0000 mg | ORAL_TABLET | Freq: Four times a day (QID) | ORAL | Status: DC
  Administered 2012-09-23 (×2): 300 mg via ORAL
  Filled 2012-09-23 (×4): qty 2

## 2012-09-23 NOTE — Progress Notes (Signed)
Pt taken back to [facility] via [facility].  All discharge paper work and AVS with meds given documented, sent to [facility].  Report called to [place] at [facility] at 1245.

## 2012-09-23 NOTE — Clinical Social Work Note (Signed)
Clinical Social Worker received pasarr # [id number] and proceed with discharge to SNF, [facility] today. CSW notified MD, facility and left a voice message with daughter. CSW will facilitate discharge to facility. CSW will complete discharge packet and place with shadow chart.   Rozetta Nunnery MSW, [employer] [phone]

## 2012-09-23 NOTE — Telephone Encounter (Signed)
I spoke with [name]. She is not letting her father go home. She has spoken with the hospital, doctors, and staff at [facility]. Patient is getting discharged back to [facility] today. They are going to work on getting the patient a new room mate in hopes that this will make him a little happier there. He knows he needs to stay.

## 2012-09-25 ENCOUNTER — Non-Acute Institutional Stay (SKILLED_NURSING_FACILITY): Payer: Medicare Other | Admitting: Internal Medicine

## 2012-09-25 DIAGNOSIS — K7290 Hepatic failure, unspecified without coma: Secondary | ICD-10-CM

## 2012-09-25 DIAGNOSIS — G40909 Epilepsy, unspecified, not intractable, without status epilepticus: Secondary | ICD-10-CM

## 2012-09-25 DIAGNOSIS — R531 Weakness: Secondary | ICD-10-CM

## 2012-09-25 DIAGNOSIS — R188 Other ascites: Secondary | ICD-10-CM

## 2012-09-25 DIAGNOSIS — R5383 Other fatigue: Secondary | ICD-10-CM

## 2012-09-25 DIAGNOSIS — K7682 Hepatic encephalopathy: Secondary | ICD-10-CM

## 2012-09-25 DIAGNOSIS — E119 Type 2 diabetes mellitus without complications: Secondary | ICD-10-CM

## 2012-09-25 NOTE — Progress Notes (Signed)
Patient ID: Tony Boyd, male   DOB: 08-20-1955, 57 y.o.   MRN: [id number]    PCP: [name], DO  Code Status: full code  Allergies  Allergen Reactions  . Ativan (Lorazepam) Other (See Comments)    "hallucinations"  . Droperidol Other (See Comments)    unknown  . Ketorolac Other (See Comments)    unknown  . Penicillins Swelling and Other (See Comments)    unkown  . Toradol (Ketorolac Tromethamine) Hives   [facility] [employer]  Patient presents with  . Hospitalization Follow-up    HPI:  57 y/o male patient is here for medical management and STR. He has history of cirrhosis of liver, hepatitis C, hypothyroidism, seizures and was here undergoing rehab. He was found to be confused and also had elevated ammonia level of 160. He was taken to the ER and ammonia was found to be around 63 and he had hyponatremia. Patient was given IV fluids in the ER and lactulose and has been admitted for further management. His hepatic encephalopathy cleared and his ammonia levels are now normal. He was maintained on fluid restriction and started on demeclocycline for his chronic hyponatremia. Once improved, he was sent back to SNF for STR. He was seen in his room today. He is alert and oriented. Denies any complaints. He mentions that he would like to go home. He has been working with therapy team. He walks around with his rollator.  Review of Systems  Constitutional: Negative for fever, chills and diaphoresis.  HENT: Negative for congestion.   Eyes: Negative for blurred vision.  Respiratory: Negative for cough and shortness of breath.   Cardiovascular: Negative for chest pain and palpitations.  Gastrointestinal: Negative for heartburn, nausea, vomiting and abdominal pain.       Having 3-4 bowel  Movements a day  Genitourinary: Negative for dysuria.  Musculoskeletal: Negative for falls.  Skin: Negative for rash.  Neurological: Negative for dizziness, seizures and headaches.   Psychiatric/Behavioral: Negative for depression and memory loss. The patient does not have insomnia.     Past Medical History  Diagnosis Date  . Cirrhosis of liver   . Hepatitis C   . ETOH abuse   . Hypothyroid   . Psychosis   . Bipolar disorder   . Seizures   . Arthritis   . Back pain   . Coagulopathy     Hx of  . Thrombocytopenia     Hx of  . Pancytopenia     Hx of  . H/O hypokalemia   . Hyponatremia     Hx of  . Korsakoff psychosis   . H/O abdominal abscess   . H/O esophageal varices   . Metabolic encephalopathy   . Hepatic encephalopathy   . Urinary urgency   . H/O renal failure   . H/O ascites   . Altered mental status   . Anemia   . Ascites   . Shortness of breath   . Peripheral vascular disease   . GERD (gastroesophageal reflux disease)   . Thrombocytopenia, acquired 06/02/2012  . Unspecified hypothyroidism 06/02/2012  . Pain in joint, pelvic region and thigh   . Edema   . Obesity, unspecified   . Chest pain, unspecified   . Pneumonitis due to inhalation of food or vomitus   . Other convulsions   . Cellulitis and abscess of upper arm and forearm   . Chronic hepatitis C without mention of hepatic coma   . Hypopotassemia   .  Anemia, unspecified   . Other and unspecified coagulation defects   . Thrombocytopenia, unspecified   . Bipolar I disorder, most recent episode (or current) unspecified   . Unspecified psychosis   . Encephalopathy   . Esophageal varices without mention of bleeding   . Esophagitis, unspecified   . Abscess of liver(572.0)   . Chronic kidney disease, unspecified   . Lumbago   . Personal history of alcoholism   . Personal history of tobacco use, presenting hazards to health   . Cirrhosis of liver without mention of alcohol   . Pneumonitis due to inhalation of food or vomitus 09/19/2010  . Chronic hepatitis C without mention of hepatic coma   . Bipolar I disorder, most recent episode (or current) unspecified   . Unspecified psychosis    . Other ascites   . Personal history of tobacco use, presenting hazards to health   . Cirrhosis of liver without mention of alcohol   . Type 2 diabetes mellitus with diabetic neuropathy    Past Surgical History  Procedure Laterality Date  . Colostomy    . Cholecystectomy    . Small intestine surgery    . Arm surgery      left arm  . US guided drain placement in ventral hernia abscess  07/21/2010  . Multiple picc line placements    . Esophagogastroduodenoscopy  06/28/2011    Procedure: ESOPHAGOGASTRODUODENOSCOPY (EGD);  Surgeon: Louis Meckel, MD;  Location: [facility];  Service: Endoscopy;  Laterality: N/A;  . Esophagogastroduodenoscopy N/A 05/06/2012    Procedure: ESOPHAGOGASTRODUODENOSCOPY (EGD);  Surgeon: Louis Meckel, MD;  Location: [facility];  Service: Endoscopy;  Laterality: N/A;  . Esophageal banding N/A 05/06/2012    Procedure: ESOPHAGEAL BANDING;  Surgeon: Louis Meckel, MD;  Location: [facility];  Service: Endoscopy;  Laterality: N/A;   Social History:   reports that he quit smoking about 4 years ago. His smoking use included Cigarettes. He smoked 0.00 packs per day. He has never used smokeless tobacco. He reports that he does not drink alcohol or use illicit drugs.  Family History  Problem Relation Age of Onset  . Heart disease Mother     MI  . Lung cancer Brother     Medications: Patient's Medications  New Prescriptions   No medications on file  Previous Medications   ALBUTEROL (PROVENTIL HFA;VENTOLIN HFA) 108 (90 BASE) MCG/ACT INHALER    Inhale 2 puffs into the lungs 2 (two) times daily as needed for wheezing.    FERROUS SULFATE 325 (65 FE) MG TABLET    Take 325 mg by mouth at bedtime.   FOLIC ACID (FOLVITE) 1 MG TABLET    Take 1 mg by mouth daily.   FUROSEMIDE (LASIX) 80 MG TABLET    Take 1 tablet (80 mg total) by mouth daily.   INSULIN ASPART (NOVOLOG) 100 UNIT/ML INJECTION    Inject 0-5 Units into the skin See admin instructions. 0-149 = 0 units;  150+ = 5 units 15 minutes before or 30 minutes after a meal.  Subcutaneously before meals for dm.   INSULIN GLARGINE (LANTUS) 100 UNIT/ML INJECTION    Inject 60 Units into the skin at bedtime.   LACTULOSE (CHRONULAC) 10 GM/15ML SOLUTION    Take 45 mLs (30 g total) by mouth 5 (five) times daily. Hold if more than 5 stools in one day   LEVETIRACETAM (KEPPRA) 1000 MG TABLET    Take 1 tablet (1,000 mg total) by mouth 2 (two) times daily.  LEVOTHYROXINE (SYNTHROID, LEVOTHROID) 75 MCG TABLET    Take 75 mcg by mouth daily.   OMEPRAZOLE (PRILOSEC) 20 MG CAPSULE    Take 20 mg by mouth daily.   POTASSIUM CHLORIDE (K-DUR) 10 MEQ TABLET    Take 1 tablet (10 mEq total) by mouth daily.   RIFAXIMIN (XIFAXAN) 550 MG TABS    Take 550 mg by mouth 2 (two) times daily.   RISPERIDONE (RISPERDAL) 0.25 MG TABLET    Take 1 tablet (0.25 mg total) by mouth 2 (two) times daily. Hold for sedation   SPIRONOLACTONE (ALDACTONE) 100 MG TABLET    Take 1 tablet (100 mg total) by mouth daily.  Modified Medications   No medications on file  Discontinued Medications   No medications on file     Physical Exam: Filed Vitals:   09/25/12 1029  BP: 100/56  Pulse: 89  Temp: 97.9 F (36.6 C)  Resp: 18  Height: 5\' 8"  (1.727 m)  Weight: 273 lb (123.832 kg)  SpO2: 95%   Constitutional: Obese Caucasian male, mildly jaundiced, has anasarca, chronic venous stasis of LEs   HENT:   Head: Normocephalic and atraumatic.   Nose: Nose normal.   Eyes: Conjunctivae normal. Pupils are dilated and minimally reactive to light, no eye discharge. Not following finger movement test. Scleral icterus present Neck: Neck supple. No JVD present. No tracheal deviation present. Cardiovascular: Normal rate, regular rhythm, normal heart sounds   Pulmonary/Chest: Effort normal and breath sounds normal.   Abdominal: Soft. Bowel sounds are normal. He exhibits distension. He exhibits no mass. There is no tenderness. There is no rebound and no guarding.   Has ascites Musculoskeletal: able to move all his extremities by himself, using rollator walker and moving around the building Lymphadenopathy:    He has no cervical adenopathy.  Skin: Skin is warm and dry. Chronic venous stasis changes    Labs reviewed: Basic Metabolic Panel:  Recent Labs  40/98/11 2100  09/21/12 0630 09/22/12 0600 09/23/12 0430  NA  --   < > 124* 124* 122*  K  --   < > 3.8 3.6 4.3  CL  --   < > 96 95* 92*  CO2  --   < > 23 23 27   GLUCOSE  --   < > 176* 72 135*  BUN  --   < > 10 8 8   CREATININE  --   < > 0.69 0.67 0.78  CALCIUM  --   < > 7.7* 7.9* 7.9*  MG 1.5  --   --   --   --   < > = values in this interval not displayed. Liver Function Tests:  Recent Labs  08/28/12 0558 09/16/12 2040 09/18/12 0530  AST 97* 68* 62*  ALT 51 49 50  ALKPHOS 186* 204* 211*  BILITOT 3.3* 4.9* 3.2*  PROT 6.3 6.3 6.4  ALBUMIN 2.1* 2.2* 2.3*    Recent Labs  09/17/12 0146  LIPASE 29    Recent Labs  09/16/12 2040 09/17/12 0518 09/20/12 0515  AMMONIA 63* 33 64*   CBC:  Recent Labs  08/21/12 1229  09/16/12 2040 09/17/12 0146 09/17/12 0518 09/18/12 0530 09/21/12 0630  WBC 5.9  < > 4.9 5.5 4.9 5.3 3.4*  NEUTROABS 5.1  --  3.5 3.5  --   --   --   HGB 11.6*  < > 10.6* 11.7* 10.6* 10.7* 11.2*  HCT 31.4*  < > 28.3* 30.4* 28.9* 28.8* 30.3*  MCV 99.7  < >  95.6 95.6 97.3 98.0 98.1  PLT 53*  < > 39* 41* 42* 44* 32*  < > = values in this interval not displayed. Cardiac Enzymes: No results found for this basename: CKTOTAL, CKMB, CKMBINDEX, TROPONINI,  in the last 8760 hours BNP: No components found with this basename: POCBNP,  CBG:  Recent Labs  09/22/12 2138 09/23/12 0747 09/23/12 1237  GLUCAP 176* 105* 152*   09/24/12 wbc 3.8, hb 9.5, hct 26.3, plt 36, na 127, k 3.8, glu 160, bun 9, cr 0.78, ca 7.7  Radiological Exams: Ct Head Wo Contrast  09/16/2012   *RADIOLOGY REPORT*  Clinical Data: Seizures.  Evaluate for encephalopathy.  CT HEAD WITHOUT  CONTRAST  Technique:  Contiguous axial images were obtained from the base of the skull through the vertex without contrast.  Comparison: 08/21/2012  Findings: The ventricles and sulci are symmetrical without significant effacement, displacement, or dilatation. No mass effect or midline shift. No abnormal extra-axial fluid collections. The grey-white matter junction is distinct. Basal cisterns are not effaced. No acute intracranial hemorrhage. No depressed skull fractures.  Visualized paranasal sinuses and mastoid air cells are not opacified. No significant change since previous study.  IMPRESSION: No acute intracranial abnormalities identified.   Original Report Authenticated By: Burman Nieves, M.D.   US Abdomen Complete  09/17/2012   *RADIOLOGY REPORT*  Clinical Data:  Abnormal liver function tests, abdominal pain.  ABDOMINAL ULTRASOUND COMPLETE  Comparison:  CT scan of September 11, 2010.  Findings:  Gallbladder:  Status post cholecystectomy.  Common Bile Duct:  Measures 7 mm which is within normal limits in caliber.  Liver: The liver is heterogeneous in echogenicity with nodular margins consistent with hepatic cirrhosis.18 mm oval shaped cyst is seen in right hepatic lobe.  IVC:  Visualized portion appears normal.  Pancreas:  Not well visualized due to overlying bowel gas.  Spleen:  Measures 13 x 17 x 6.4 cm with calculated volume of 730 cubic centimeters; this is consistent with mild splenomegaly.  Right kidney:  Measures 11.8 cm in length. Normal in size and parenchymal echogenicity.  No evidence of mass or hydronephrosis.  Left kidney:  4.7 cm cyst is seen in upper pole. Measures 10.9 cm in length. Normal in size and parenchymal echogenicity.  No evidence of mass or hydronephrosis.  Abdominal Aorta:  Not well visualized due to overlying bowel gas.  IMPRESSION: Hepatic cirrhosis.  Status post cholecystectomy.  Mild splenomegaly.  Left renal cyst.  No other abnormality seen in the abdomen.   Original Report  Authenticated By: Lupita Raider.,  M.D.   Dg Chest Port 1 View  09/16/2012   *RADIOLOGY REPORT*  Clinical Data: Altered mental status.  Mid abdominal pain.  PORTABLE CHEST - 1 VIEW  Comparison: 08/21/2012  Findings: Normal heart size and pulmonary vascularity. Peribronchial thickening and central interstitial changes suggesting chronic bronchitis.  No focal airspace disease or consolidation in the lungs.  No blunting of costophrenic angles. No pneumothorax.  Mediastinal contours appear intact.  No significant change since previous study.  IMPRESSION: Chronic bronchitic changes.  No evidence of active infiltration.   Original Report Authenticated By: Burman Nieves, M.D.   Dg Abd Portable 1v  09/16/2012   *RADIOLOGY REPORT*  Clinical Data: Abdominal pain.  Altered mental status.  PORTABLE ABDOMEN - 1 VIEW  Comparison: CT abdomen and pelvis 09/11/2010  Findings: The entire abdomen is not included within the field of view, limiting its evaluation.  Diffuse prominence of gas-filled mid abdominal small bowel and colon.  Similar changes were demonstrated on the previous CT abdomen.  Findings likely to represent ileus.  No radiopaque stones visualized.  Degenerative changes in the lumbar spine.  IMPRESSION: Prominent gas distended small and large bowel throughout the abdomen most suggestive of ileus.   Original Report Authenticated By: Burman Nieves, M.D.    Assessment/Plan  Generalized weakness- from deconditioning. To work with therapy team. Fall precautions. Fluid restriction. Medication compliance reinforced  Liver cirrhosis with encephalopathy- currently alert and oriented. Taking his lactulose, has regular bowel movement. Continue rifaximin  Ascites- in setting of liver cirrhosis. Has been having weight gain and increase in abdominal girth. No dyspnea noted. Continue spironolactone and furosemide for now. Monitor weight  Dm- continue lantus with novolog. Monitor cbg  Anemia- continue ferrous  sulfate. Has drop in his hb/hct. Will recheck cbc in a week  Hypokalemia- continue kcl supplement  Seizure- remians seizure free. Continue keppra 1000 mg bid  gerd- continue omeprazole for now   Labs/tests ordered- cbc in 1 week

## 2012-09-26 ENCOUNTER — Non-Acute Institutional Stay (SKILLED_NURSING_FACILITY): Payer: Medicare Other | Admitting: Internal Medicine

## 2012-09-26 DIAGNOSIS — R531 Weakness: Secondary | ICD-10-CM

## 2012-09-26 DIAGNOSIS — R5383 Other fatigue: Secondary | ICD-10-CM

## 2012-09-26 DIAGNOSIS — G40909 Epilepsy, unspecified, not intractable, without status epilepticus: Secondary | ICD-10-CM

## 2012-09-26 DIAGNOSIS — E119 Type 2 diabetes mellitus without complications: Secondary | ICD-10-CM

## 2012-09-26 DIAGNOSIS — R188 Other ascites: Secondary | ICD-10-CM

## 2012-09-26 DIAGNOSIS — K7290 Hepatic failure, unspecified without coma: Secondary | ICD-10-CM

## 2012-09-26 DIAGNOSIS — D649 Anemia, unspecified: Secondary | ICD-10-CM | POA: Insufficient documentation

## 2012-09-26 NOTE — Progress Notes (Signed)
Patient ID: Tony Boyd, male   DOB: Mar 02, 1955, 57 y.o.   MRN: [id number]  Cc- discharge visit  HPI- 56 y/o male patient is here for medical management and STR. He has history of cirrhosis of liver, hepatitis C, hypothyroidism, seizures  And frequent hepatic encephalopathy. He wants to be discharged home today. He will benefit from further stay in facility and undergoing rehab and also for medicaiton complaince. But he insists on going home and mentions he will sign against medical advise if needed. He is alert and oriented. Denies any complaints. He walks around with his rollator.  Unable to get in touch with hi daughter despite several attempts  Review of Systems  Constitutional: Negative for fever, chills and diaphoresis.  HENT: Negative for congestion.   Eyes: Negative for blurred vision.  Respiratory: Negative for cough and shortness of breath.   Cardiovascular: Negative for chest pain and palpitations.  Gastrointestinal: Negative for heartburn, nausea, vomiting and abdominal pain.        Having 3-4 bowel  Movements a day  Genitourinary: Negative for dysuria.  Musculoskeletal: Negative for falls.  Skin: Negative for rash.  Neurological: Negative for dizziness, seizures and headaches.  Psychiatric/Behavioral: Negative for depression and memory loss. The patient does not have insomnia.    Allergies  Allergen Reactions  . Ativan (Lorazepam) Other (See Comments)    "hallucinations"  . Droperidol Other (See Comments)    unknown  . Ketorolac Other (See Comments)    unknown  . Penicillins Swelling and Other (See Comments)    unkown  . Toradol (Ketorolac Tromethamine) Hives   Current Outpatient Prescriptions on File Prior to Visit  Medication Sig Dispense Refill  . albuterol (PROVENTIL HFA;VENTOLIN HFA) 108 (90 BASE) MCG/ACT inhaler Inhale 2 puffs into the lungs 2 (two) times daily as needed for wheezing.       . ferrous sulfate 325 (65 FE) MG tablet Take 325 mg by mouth at bedtime.       . folic acid (FOLVITE) 1 MG tablet Take 1 mg by mouth daily.      . furosemide (LASIX) 80 MG tablet Take 1 tablet (80 mg total) by mouth daily.      . insulin aspart (NOVOLOG) 100 UNIT/ML injection Inject 0-5 Units into the skin See admin instructions. 0-149 = 0 units; 150+ = 5 units 15 minutes before or 30 minutes after a meal.  Subcutaneously before meals for dm.      . insulin glargine (LANTUS) 100 UNIT/ML injection Inject 60 Units into the skin at bedtime.      [name] lactulose (CHRONULAC) 10 GM/15ML solution Take 45 mLs (30 g total) by mouth 5 (five) times daily. Hold if more than 5 stools in one day  240 mL  0  . levETIRAcetam (KEPPRA) 1000 MG tablet Take 1 tablet (1,000 mg total) by mouth 2 (two) times daily.      [name] levothyroxine (SYNTHROID, LEVOTHROID) 75 MCG tablet Take 75 mcg by mouth daily.      [name] omeprazole (PRILOSEC) 20 MG capsule Take 20 mg by mouth daily.      . potassium chloride (K-DUR) 10 MEQ tablet Take 1 tablet (10 mEq total) by mouth daily.      . rifaximin (XIFAXAN) 550 MG TABS Take 550 mg by mouth 2 (two) times daily.      . risperiDONE (RISPERDAL) 0.25 MG tablet Take 1 tablet (0.25 mg total) by mouth 2 (two) times daily. Hold for sedation      .  spironolactone (ALDACTONE) 100 MG tablet Take 1 tablet (100 mg total) by mouth daily.       No current facility-administered medications on file prior to visit.   reviewed medications  Vital signs are stable, remains afebrile  Constitutional: Obese Caucasian male, mildly jaundiced, has anasarca, chronic venous stasis of LEs  HENT:   Head: Normocephalic and atraumatic.   Nose: Nose normal.   Eyes: Conjunctivae normal. Pupils are dilated and minimally reactive to light, no eye discharge. Not following finger movement test. Scleral icterus present Neck: Neck supple. No JVD present. No tracheal deviation present. Cardiovascular: Normal rate, regular rhythm, normal heart sounds   Pulmonary/Chest: Effort normal and breath sounds  normal.   Abdominal: Soft. Bowel sounds are normal. He exhibits distension. He exhibits no mass. There is no tenderness. There is no rebound and no guarding.  Has ascites Musculoskeletal: able to move all his extremities by himself, using rollator walker and moving around the building Lymphadenopathy:    He has no cervical adenopathy.   Skin: Skin is warm and dry. Chronic venous stasis changes  ASSESSMENT/PLAN  Patient insists on going home. At present he is ready to leave the building. I will provide his scripts. Went over all his medications and importance of med compliance. Also talked about abstaining from alcohol and patient mentions he will try to do this. Unable to get in touch with daughter. Will set up home PT/OT/RN, diabetic teaching. Weight monitoring for him. Have made appointment for him to be seen in office next week with his PCP  Anemia- continue ferrous sulfate. Has drop in his hb/hct. Will need cbc check in 1 week in pcp office  Generalized weakness- from deconditioning. To work with therapy team. Fall precautions. Fluid restriction. Medication compliance reinforced. Home health referral provided  Liver cirrhosis with encephalopathy- currently alert and oriented. Taking his lactulose, has regular bowel movement. Continue rifaximin and lactulose. He understands to take lactulose 5 times a day for now  Ascites- in setting of liver cirrhosis. Has been having weight gain and increase in abdominal girth. No dyspnea noted. Continue spironolactone and furosemide for now. Monitor weight. He will need therapeutic tap and agrees to keeping his appointment at PCP office to address this further  Dm- continue lantus with novolog. Monitor cbg. Diabetic teaching to be provided  Hypokalemia- continue kcl supplement. Monitor bmp  Seizure- remians seizure free. Continue keppra 1000 mg bid  gerd- continue omeprazole for now  Spent more than 30 minutes talking to patient, his PCP, arranging  home health referral and writing prescriptions. Also explained to him the warning signs requiring him to see PCP or go to the ED and pt voices understanding this

## 2012-09-29 ENCOUNTER — Telehealth: Payer: Self-pay | Admitting: Geriatric Medicine

## 2012-09-29 NOTE — Telephone Encounter (Signed)
Patient's daughter called and said that patient was discharged from [facility] on Friday. They took him off of his xanax while in the facility. [name] said that the patient is very moody and hard to deal with. She has refills on his xanax and he has been on it for 5 years. She said she is going to go get it filled and she will talk to Dr. Renato Gails about it when he comes in for his appointment on 10/02/12.

## 2012-09-30 ENCOUNTER — Encounter (HOSPITAL_COMMUNITY): Payer: Self-pay | Admitting: Emergency Medicine

## 2012-09-30 ENCOUNTER — Emergency Department (HOSPITAL_COMMUNITY)
Admission: EM | Admit: 2012-09-30 | Discharge: 2012-09-30 | Disposition: A | Discharge status: Home / Self Care | Payer: Medicare Other | Attending: Emergency Medicine | Admitting: Emergency Medicine

## 2012-09-30 DIAGNOSIS — Z87448 Personal history of other diseases of urinary system: Secondary | ICD-10-CM | POA: Insufficient documentation

## 2012-09-30 DIAGNOSIS — R109 Unspecified abdominal pain: Secondary | ICD-10-CM | POA: Insufficient documentation

## 2012-09-30 DIAGNOSIS — Z87891 Personal history of nicotine dependence: Secondary | ICD-10-CM | POA: Insufficient documentation

## 2012-09-30 DIAGNOSIS — Z872 Personal history of diseases of the skin and subcutaneous tissue: Secondary | ICD-10-CM | POA: Insufficient documentation

## 2012-09-30 DIAGNOSIS — Z9889 Other specified postprocedural states: Secondary | ICD-10-CM | POA: Insufficient documentation

## 2012-09-30 DIAGNOSIS — Z8639 Personal history of other endocrine, nutritional and metabolic disease: Secondary | ICD-10-CM | POA: Insufficient documentation

## 2012-09-30 DIAGNOSIS — Z794 Long term (current) use of insulin: Secondary | ICD-10-CM | POA: Insufficient documentation

## 2012-09-30 DIAGNOSIS — Z8739 Personal history of other diseases of the musculoskeletal system and connective tissue: Secondary | ICD-10-CM | POA: Insufficient documentation

## 2012-09-30 DIAGNOSIS — E1149 Type 2 diabetes mellitus with other diabetic neurological complication: Secondary | ICD-10-CM | POA: Insufficient documentation

## 2012-09-30 DIAGNOSIS — D649 Anemia, unspecified: Secondary | ICD-10-CM | POA: Insufficient documentation

## 2012-09-30 DIAGNOSIS — Z880 Allergy status to penicillin: Secondary | ICD-10-CM | POA: Insufficient documentation

## 2012-09-30 DIAGNOSIS — G589 Mononeuropathy, unspecified: Secondary | ICD-10-CM | POA: Insufficient documentation

## 2012-09-30 DIAGNOSIS — E039 Hypothyroidism, unspecified: Secondary | ICD-10-CM | POA: Insufficient documentation

## 2012-09-30 DIAGNOSIS — N189 Chronic kidney disease, unspecified: Secondary | ICD-10-CM | POA: Insufficient documentation

## 2012-09-30 DIAGNOSIS — Z87898 Personal history of other specified conditions: Secondary | ICD-10-CM | POA: Insufficient documentation

## 2012-09-30 DIAGNOSIS — R609 Edema, unspecified: Secondary | ICD-10-CM | POA: Insufficient documentation

## 2012-09-30 DIAGNOSIS — F319 Bipolar disorder, unspecified: Secondary | ICD-10-CM | POA: Insufficient documentation

## 2012-09-30 DIAGNOSIS — Z8679 Personal history of other diseases of the circulatory system: Secondary | ICD-10-CM | POA: Insufficient documentation

## 2012-09-30 DIAGNOSIS — E876 Hypokalemia: Secondary | ICD-10-CM | POA: Insufficient documentation

## 2012-09-30 DIAGNOSIS — Z79899 Other long term (current) drug therapy: Secondary | ICD-10-CM | POA: Insufficient documentation

## 2012-09-30 DIAGNOSIS — K7460 Unspecified cirrhosis of liver: Secondary | ICD-10-CM | POA: Insufficient documentation

## 2012-09-30 DIAGNOSIS — Z8669 Personal history of other diseases of the nervous system and sense organs: Secondary | ICD-10-CM | POA: Insufficient documentation

## 2012-09-30 DIAGNOSIS — Z8719 Personal history of other diseases of the digestive system: Secondary | ICD-10-CM | POA: Insufficient documentation

## 2012-09-30 DIAGNOSIS — F1021 Alcohol dependence, in remission: Secondary | ICD-10-CM | POA: Insufficient documentation

## 2012-09-30 DIAGNOSIS — E669 Obesity, unspecified: Secondary | ICD-10-CM | POA: Insufficient documentation

## 2012-09-30 DIAGNOSIS — M549 Dorsalgia, unspecified: Secondary | ICD-10-CM | POA: Insufficient documentation

## 2012-09-30 DIAGNOSIS — G40909 Epilepsy, unspecified, not intractable, without status epilepticus: Secondary | ICD-10-CM | POA: Insufficient documentation

## 2012-09-30 DIAGNOSIS — Z8619 Personal history of other infectious and parasitic diseases: Secondary | ICD-10-CM | POA: Insufficient documentation

## 2012-09-30 DIAGNOSIS — Z8659 Personal history of other mental and behavioral disorders: Secondary | ICD-10-CM | POA: Insufficient documentation

## 2012-09-30 DIAGNOSIS — Z862 Personal history of diseases of the blood and blood-forming organs and certain disorders involving the immune mechanism: Secondary | ICD-10-CM | POA: Insufficient documentation

## 2012-09-30 DIAGNOSIS — E162 Hypoglycemia, unspecified: Secondary | ICD-10-CM | POA: Insufficient documentation

## 2012-09-30 LAB — CBC WITH DIFFERENTIAL/PLATELET
Basophils Absolute: 0 10*3/uL (ref 0.0–0.1)
Basophils Relative: 0 % (ref 0–1)
Eosinophils Absolute: 0.1 10*3/uL (ref 0.0–0.7)
Eosinophils Relative: 1 % (ref 0–5)
HCT: 32.6 % — ABNORMAL LOW (ref 39.0–52.0)
Hemoglobin: 11.9 g/dL — ABNORMAL LOW (ref 13.0–17.0)
Lymphocytes Relative: 10 % — ABNORMAL LOW (ref 12–46)
Lymphs Abs: 0.9 10*3/uL (ref 0.7–4.0)
MCH: 37 pg — ABNORMAL HIGH (ref 26.0–34.0)
MCHC: 36.5 g/dL — ABNORMAL HIGH (ref 30.0–36.0)
MCV: 101.2 fL — ABNORMAL HIGH (ref 78.0–100.0)
Monocytes Absolute: 0.8 10*3/uL (ref 0.1–1.0)
Monocytes Relative: 10 % (ref 3–12)
Neutro Abs: 6.6 10*3/uL (ref 1.7–7.7)
Neutrophils Relative %: 79 % — ABNORMAL HIGH (ref 43–77)
Platelets: 33 10*3/uL — ABNORMAL LOW (ref 150–400)
RBC: 3.22 MIL/uL — ABNORMAL LOW (ref 4.22–5.81)
RDW: 15.3 % (ref 11.5–15.5)
WBC: 8.4 10*3/uL (ref 4.0–10.5)

## 2012-09-30 LAB — COMPREHENSIVE METABOLIC PANEL
ALT: 41 U/L (ref 0–53)
AST: 60 U/L — ABNORMAL HIGH (ref 0–37)
Albumin: 2.4 g/dL — ABNORMAL LOW (ref 3.5–5.2)
Alkaline Phosphatase: 219 U/L — ABNORMAL HIGH (ref 39–117)
BUN: 9 mg/dL (ref 6–23)
CO2: 25 mEq/L (ref 19–32)
Calcium: 8.3 mg/dL — ABNORMAL LOW (ref 8.4–10.5)
Chloride: 95 mEq/L — ABNORMAL LOW (ref 96–112)
Creatinine, Ser: 0.69 mg/dL (ref 0.50–1.35)
GFR calc Af Amer: >90 mL/min (ref >90)
GFR calc non Af Amer: >90 mL/min (ref >90)
Glucose, Bld: 42 mg/dL — CL (ref 70–99)
Potassium: 2.5 mEq/L — CL (ref 3.5–5.1)
Sodium: 130 mEq/L — ABNORMAL LOW (ref 135–145)
Total Bilirubin: 2.2 mg/dL — ABNORMAL HIGH (ref 0.3–1.2)
Total Protein: 6.5 g/dL (ref 6.0–8.3)

## 2012-09-30 LAB — URINALYSIS, ROUTINE W REFLEX MICROSCOPIC
Bilirubin Urine: NEGATIVE
Glucose, UA: NEGATIVE mg/dL
Hgb urine dipstick: NEGATIVE
Ketones, ur: NEGATIVE mg/dL
Nitrite: NEGATIVE
Protein, ur: NEGATIVE mg/dL
Specific Gravity, Urine: 1.02 (ref 1.005–1.030)
Urobilinogen, UA: 0.2 mg/dL (ref 0.0–1.0)
pH: 5.5 (ref 5.0–8.0)

## 2012-09-30 LAB — URINE MICROSCOPIC-ADD ON

## 2012-09-30 LAB — AMMONIA: Ammonia: 74 umol/L — ABNORMAL HIGH (ref 11–60)

## 2012-09-30 LAB — PROTIME-INR
INR: 1.61 — ABNORMAL HIGH (ref 0.00–1.49)
Prothrombin Time: 18.7 seconds — ABNORMAL HIGH (ref 11.6–15.2)

## 2012-09-30 LAB — TYPE AND SCREEN
ABO/RH(D): A POS
Antibody Screen: NEGATIVE

## 2012-09-30 LAB — LIPASE, BLOOD: Lipase: 47 U/L (ref 11–59)

## 2012-09-30 LAB — MAGNESIUM: Magnesium: 1.6 mg/dL (ref 1.5–2.5)

## 2012-09-30 LAB — GLUCOSE, CAPILLARY: Glucose-Capillary: 121 mg/dL — ABNORMAL HIGH (ref 70–99)

## 2012-09-30 MED ORDER — HYDROMORPHONE HCL PF 1 MG/ML IJ SOLN
1.0000 mg | Freq: Once | INTRAMUSCULAR | Status: AC
  Administered 2012-09-30: 1 mg via INTRAMUSCULAR

## 2012-09-30 MED ORDER — LACTULOSE 10 GM/15ML PO SOLN
20.0000 g | Freq: Once | ORAL | Status: AC
  Administered 2012-09-30: 20 g via ORAL
  Filled 2012-09-30: qty 30

## 2012-09-30 MED ORDER — POTASSIUM CHLORIDE 10 MEQ/100ML IV SOLN
10.0000 meq | Freq: Once | INTRAVENOUS | Status: AC
  Administered 2012-09-30: 10 meq via INTRAVENOUS
  Filled 2012-09-30: qty 100

## 2012-09-30 MED ORDER — HYDROMORPHONE HCL PF 1 MG/ML IJ SOLN
1.0000 mg | Freq: Once | INTRAMUSCULAR | Status: DC
  Filled 2012-09-30: qty 1

## 2012-09-30 MED ORDER — HYDROMORPHONE HCL PF 1 MG/ML IJ SOLN
1.0000 mg | Freq: Once | INTRAMUSCULAR | Status: AC
  Administered 2012-09-30: 1 mg via INTRAVENOUS
  Filled 2012-09-30: qty 1

## 2012-09-30 MED ORDER — DEXTROSE 50 % IV SOLN
1.0000 | Freq: Once | INTRAVENOUS | Status: AC
  Administered 2012-09-30: 50 mL via INTRAVENOUS
  Filled 2012-09-30: qty 50

## 2012-09-30 MED ORDER — POTASSIUM CHLORIDE CRYS ER 20 MEQ PO TBCR
80.0000 meq | EXTENDED_RELEASE_TABLET | Freq: Once | ORAL | Status: AC
  Administered 2012-09-30: 80 meq via ORAL
  Filled 2012-09-30: qty 4

## 2012-09-30 NOTE — ED Provider Notes (Signed)
CSN: [id number]     Arrival date & time 09/30/12  0315 History     First MD Initiated Contact with Patient 09/30/12 [phone]     Chief Complaint  Patient presents with  . Hematemesis   (Consider location/radiation/quality/duration/timing/severity/associated sxs/prior Treatment) HPI  Tony Boyd is a 57 y.o. male with an extensive past medical history, most notably cirrhosis and multiple sequelae resulting from it. Is presenting today with abdominal and back pain. He states that this is chronic in nature but it is worse over the past several days because he doesn't have the same amount of pain medication he reportedly usually takes. Nursing report of hematemesis. Patient states that he has been spitting up blood occasionally. He has a history of known varices and states spitting up blood is not unusual for him. He denies the amount of bleeding it is acutely worse than it typically is. No acute dizziness, lightheadedness or shortness of breath.  Past Medical History  Diagnosis Date  . Cirrhosis of liver   . Hepatitis C   . ETOH abuse   . Hypothyroid   . Psychosis   . Bipolar disorder   . Seizures   . Arthritis   . Back pain   . Coagulopathy     Hx of  . Thrombocytopenia     Hx of  . Pancytopenia     Hx of  . H/O hypokalemia   . Hyponatremia     Hx of  . Korsakoff psychosis   . H/O abdominal abscess   . H/O esophageal varices   . Metabolic encephalopathy   . Hepatic encephalopathy   . Urinary urgency   . H/O renal failure   . H/O ascites   . Altered mental status   . Anemia   . Ascites   . Shortness of breath   . Peripheral vascular disease   . GERD (gastroesophageal reflux disease)   . Thrombocytopenia, acquired 06/02/2012  . Unspecified hypothyroidism 06/02/2012  . Pain in joint, pelvic region and thigh   . Edema   . Obesity, unspecified   . Chest pain, unspecified   . Pneumonitis due to inhalation of food or vomitus   . Other convulsions   . Cellulitis and abscess of  upper arm and forearm   . Chronic hepatitis C without mention of hepatic coma   . Hypopotassemia   . Anemia, unspecified   . Other and unspecified coagulation defects   . Thrombocytopenia, unspecified   . Bipolar I disorder, most recent episode (or current) unspecified   . Unspecified psychosis   . Encephalopathy   . Esophageal varices without mention of bleeding   . Esophagitis, unspecified   . Abscess of liver(572.0)   . Chronic kidney disease, unspecified   . Lumbago   . Personal history of alcoholism   . Personal history of tobacco use, presenting hazards to health   . Cirrhosis of liver without mention of alcohol   . Pneumonitis due to inhalation of food or vomitus 09/19/2010  . Chronic hepatitis C without mention of hepatic coma   . Bipolar I disorder, most recent episode (or current) unspecified   . Unspecified psychosis   . Other ascites   . Personal history of tobacco use, presenting hazards to health   . Cirrhosis of liver without mention of alcohol   . Type 2 diabetes mellitus with diabetic neuropathy    Past Surgical History  Procedure Laterality Date  . Colostomy    . Cholecystectomy    .  Small intestine surgery    . Arm surgery      left arm  . US guided drain placement in ventral hernia abscess  07/21/2010  . Multiple picc line placements    . Esophagogastroduodenoscopy  06/28/2011    Procedure: ESOPHAGOGASTRODUODENOSCOPY (EGD);  Surgeon: Louis Meckel, MD;  Location: [facility];  Service: Endoscopy;  Laterality: N/A;  . Esophagogastroduodenoscopy N/A 05/06/2012    Procedure: ESOPHAGOGASTRODUODENOSCOPY (EGD);  Surgeon: Louis Meckel, MD;  Location: [facility];  Service: Endoscopy;  Laterality: N/A;  . Esophageal banding N/A 05/06/2012    Procedure: ESOPHAGEAL BANDING;  Surgeon: Louis Meckel, MD;  Location: [facility];  Service: Endoscopy;  Laterality: N/A;   Family History  Problem Relation Age of Onset  . Heart disease Mother     MI  . Lung cancer  Brother    History  Substance Use Topics  . Smoking status: Former Smoker    Types: Cigarettes    Quit date: 02/15/2008  . Smokeless tobacco: Never Used  . Alcohol Use: No    Review of Systems  All systems reviewed and negative, other than as noted in HPI.   Allergies  Ativan; Droperidol; Ketorolac; Penicillins; and Toradol  Home Medications   Current Outpatient Rx  Name  Route  Sig  Dispense  Refill  . albuterol (PROVENTIL HFA;VENTOLIN HFA) 108 (90 BASE) MCG/ACT inhaler   Inhalation   Inhale 2 puffs into the lungs 2 (two) times daily as needed for wheezing.          . ferrous sulfate 325 (65 FE) MG tablet   Oral   Take 325 mg by mouth at bedtime.         . folic acid (FOLVITE) 1 MG tablet   Oral   Take 1 mg by mouth daily.         . insulin aspart (NOVOLOG) 100 UNIT/ML injection   Subcutaneous   Inject 0-5 Units into the skin See admin instructions. 0-149 = 0 units; 150+ = 5 units 15 minutes before or 30 minutes after a meal.  Subcutaneously before meals for dm.         . insulin glargine (LANTUS) 100 UNIT/ML injection   Subcutaneous   Inject 60 Units into the skin at bedtime.         [name] lactulose (CHRONULAC) 10 GM/15ML solution   Oral   Take 45 mLs (30 g total) by mouth 5 (five) times daily. Hold if more than 5 stools in one day   240 mL   0   . levothyroxine (SYNTHROID, LEVOTHROID) 75 MCG tablet   Oral   Take 75 mcg by mouth daily.         [name] omeprazole (PRILOSEC) 20 MG capsule   Oral   Take 20 mg by mouth daily.         . potassium chloride (K-DUR) 10 MEQ tablet   Oral   Take 1 tablet (10 mEq total) by mouth daily.         . rifaximin (XIFAXAN) 550 MG TABS   Oral   Take 550 mg by mouth 2 (two) times daily.         . risperiDONE (RISPERDAL) 0.25 MG tablet   Oral   Take 1 tablet (0.25 mg total) by mouth 2 (two) times daily. Hold for sedation         . spironolactone (ALDACTONE) 100 MG tablet   Oral   Take 1 tablet (100 mg  total)  by mouth daily.          BP 121/52  Pulse 91  Temp(Src) 97.5 F (36.4 C) (Oral)  SpO2 96% Physical Exam  Nursing note and vitals reviewed. Constitutional: He appears well-developed and well-nourished. No distress.  HENT:  Head: Normocephalic and atraumatic.  Eyes: Conjunctivae are normal. Right eye exhibits no discharge. Left eye exhibits no discharge.  Neck: Neck supple.  Cardiovascular: Normal rate, regular rhythm and normal heart sounds.  Exam reveals no gallop and no friction rub.   No murmur heard. Pulmonary/Chest: Effort normal and breath sounds normal. No respiratory distress.  Abdominal: Soft. He exhibits distension. There is no tenderness.  Distended abdomen with fluid wave. Soft and nontender.  Musculoskeletal: He exhibits edema. He exhibits no tenderness.  Symmetric pitting LE edema  Neurological: He is alert. No cranial nerve deficit. He exhibits normal muscle tone. Coordination normal.  Skin: Skin is warm and dry.  Psychiatric: He has a normal mood and affect. His behavior is normal. Thought content normal.    ED Course   Procedures (including critical care time)  Labs Reviewed  PROTIME-INR - Abnormal; Notable for the following:    Prothrombin Time 18.7 (*)    INR 1.61 (*)    All other components within normal limits  CBC WITH DIFFERENTIAL  URINALYSIS, ROUTINE W REFLEX MICROSCOPIC  CBC WITH DIFFERENTIAL  COMPREHENSIVE METABOLIC PANEL  LIPASE, BLOOD  AMMONIA  TYPE AND SCREEN   No results found. 1. Abdominal pain   2. Hypokalemia   3. Hypoglycemia   4. Cirrhosis     MDM  57 year old male with a complicated past medical history with abdominal and back pain. He states this is a chronic issue but recently worse because he doesn't have what he feels is adequate pain medication. Doubt SBP or acute surgical process with soft and nontender abdomen. Report of hematemesis with known varices and coagulopathy. Pt reports that not significantly worse than  typical. No hematemesis through out ED stay. Not tachycardic. BP consistent with previous evaluations.H/H actually above baseline. Ammonia level elevated, but pt is lucid. Hypokalemia. Repleted. Hypoglycemia w/o complaints. Eating and sugar improved.   Raeford Razor, MD 09/30/12 [phone]

## 2012-09-30 NOTE — ED Notes (Signed)
[facility] called  

## 2012-09-30 NOTE — ED Notes (Signed)
[id number]<AV> Expected date:<BR> Expected time:<BR> Means of arrival:<BR> Comments:<BR> EMS 57yo M; abd pain, vomiting; hx of liver failure

## 2012-09-30 NOTE — ED Notes (Signed)
Per EMS pt is from [place]. Pt has hx of liver failure and is on the liver transport list. Pt c/o of vomiting blood and having abdominal and lower back pain.

## 2012-10-02 ENCOUNTER — Ambulatory Visit (INDEPENDENT_AMBULATORY_CARE_PROVIDER_SITE_OTHER): Payer: Medicare Other | Admitting: Nurse Practitioner

## 2012-10-02 ENCOUNTER — Other Ambulatory Visit: Payer: Self-pay | Admitting: Geriatric Medicine

## 2012-10-02 ENCOUNTER — Encounter: Payer: Self-pay | Admitting: Nurse Practitioner

## 2012-10-02 VITALS — BP 150/62 | HR 97 | Temp 98.1°F | Resp 20 | Ht 68.0 in | Wt 302.0 lb

## 2012-10-02 DIAGNOSIS — R5381 Other malaise: Secondary | ICD-10-CM

## 2012-10-02 DIAGNOSIS — R531 Weakness: Secondary | ICD-10-CM

## 2012-10-02 DIAGNOSIS — D649 Anemia, unspecified: Secondary | ICD-10-CM

## 2012-10-02 DIAGNOSIS — E119 Type 2 diabetes mellitus without complications: Secondary | ICD-10-CM

## 2012-10-02 DIAGNOSIS — R0602 Shortness of breath: Secondary | ICD-10-CM

## 2012-10-02 DIAGNOSIS — F411 Generalized anxiety disorder: Secondary | ICD-10-CM

## 2012-10-02 MED ORDER — OXYCODONE HCL 10 MG PO TABS: 10.0000 mg | ORAL_TABLET | ORAL | Status: DC | PRN

## 2012-10-02 MED ORDER — ALPRAZOLAM 1 MG PO TABS: 1.0000 mg | ORAL_TABLET | Freq: Every evening | ORAL | Status: DC | PRN

## 2012-10-02 NOTE — Progress Notes (Signed)
Patient ID: Tony Boyd, male   DOB: 1955/04/10, 57 y.o.   MRN: [id number]  Patient has been discharged on oxycodone 10 mg every 4 hr as needed for pain (60 tablets)

## 2012-10-02 NOTE — Progress Notes (Signed)
Patient ID: Tony Boyd, male   DOB: Sep 19, 1955, 57 y.o.   MRN: [id number]   Allergies  Allergen Reactions  . Ativan (Lorazepam) Other (See Comments)    "hallucinations"  . Droperidol Other (See Comments)    unknown  . Ketorolac Other (See Comments)    unknown  . Penicillins Swelling and Other (See Comments)    unkown  . Toradol (Ketorolac Tromethamine) Hives    Chief Complaint  Patient presents with  . Follow-up    HPI: Patient is a 57 y.o. male seen in the office today for follow up from nursing home.  Was admitted to [facility] for diabetic management and insulin education. There was no nursing follow up from the nursing home.  daughter has been giving him insulin but needs help. She reports she does not trust him.  60 units of lantus at night; however it dropped to 42 in the middle of the night  Has dropped his insulin down to 40.  Has been taking his blood sugars QID - if blood sugar is over 150 with meals give 5 units of novolog No other hypoglycemic events  trying to get a better diet.   Daughter reports he has been wheezing at night; smoked for 40 years but has quit 5 years.  Never been diagnosed with COPD but is having increase in shortness of breath. Was on oxygen in the past (may 09)  was on hospice due to comfort measure.   Hx of PTSD and at night his anxiety is worse; was on xanax [facility] to [facility] but this mediation was stopped  there and they are unsure why-- he has been on this medication chronically.  Review of Systems:  Review of Systems  Constitutional: Negative for fever and chills.  Respiratory: Positive for shortness of breath. Negative for cough.   Cardiovascular: Negative for chest pain.  Gastrointestinal: Negative for abdominal pain, diarrhea and constipation.  Genitourinary: Negative for dysuria, urgency and frequency.  Skin: Negative.   Neurological: Positive for weakness.  Psychiatric/Behavioral: The patient is nervous/anxious.       Past Medical History  Diagnosis Date  . Cirrhosis of liver   . Hepatitis C   . ETOH abuse   . Hypothyroid   . Psychosis   . Bipolar disorder   . Seizures   . Arthritis   . Back pain   . Coagulopathy     Hx of  . Thrombocytopenia     Hx of  . Pancytopenia     Hx of  . H/O hypokalemia   . Hyponatremia     Hx of  . Korsakoff psychosis   . H/O abdominal abscess   . H/O esophageal varices   . Metabolic encephalopathy   . Hepatic encephalopathy   . Urinary urgency   . H/O renal failure   . H/O ascites   . Altered mental status   . Anemia   . Ascites   . Shortness of breath   . Peripheral vascular disease   . GERD (gastroesophageal reflux disease)   . Thrombocytopenia, acquired 06/02/2012  . Unspecified hypothyroidism 06/02/2012  . Pain in joint, pelvic region and thigh   . Edema   . Obesity, unspecified   . Chest pain, unspecified   . Pneumonitis due to inhalation of food or vomitus   . Other convulsions   . Cellulitis and abscess of upper arm and forearm   . Chronic hepatitis C without mention of hepatic coma   . Hypopotassemia   .  Anemia, unspecified   . Other and unspecified coagulation defects   . Thrombocytopenia, unspecified   . Bipolar I disorder, most recent episode (or current) unspecified   . Unspecified psychosis   . Encephalopathy   . Esophageal varices without mention of bleeding   . Esophagitis, unspecified   . Abscess of liver(572.0)   . Chronic kidney disease, unspecified   . Lumbago   . Personal history of alcoholism   . Personal history of tobacco use, presenting hazards to health   . Cirrhosis of liver without mention of alcohol   . Pneumonitis due to inhalation of food or vomitus 09/19/2010  . Chronic hepatitis C without mention of hepatic coma   . Bipolar I disorder, most recent episode (or current) unspecified   . Unspecified psychosis   . Other ascites   . Personal history of tobacco use, presenting hazards to health   . Cirrhosis  of liver without mention of alcohol   . Type 2 diabetes mellitus with diabetic neuropathy    Past Surgical History  Procedure Laterality Date  . Colostomy    . Cholecystectomy    . Small intestine surgery    . Arm surgery      left arm  . US guided drain placement in ventral hernia abscess  07/21/2010  . Multiple picc line placements    . Esophagogastroduodenoscopy  06/28/2011    Procedure: ESOPHAGOGASTRODUODENOSCOPY (EGD);  Surgeon: Louis Meckel, MD;  Location: [facility];  Service: Endoscopy;  Laterality: N/A;  . Esophagogastroduodenoscopy N/A 05/06/2012    Procedure: ESOPHAGOGASTRODUODENOSCOPY (EGD);  Surgeon: Louis Meckel, MD;  Location: [facility];  Service: Endoscopy;  Laterality: N/A;  . Esophageal banding N/A 05/06/2012    Procedure: ESOPHAGEAL BANDING;  Surgeon: Louis Meckel, MD;  Location: [facility];  Service: Endoscopy;  Laterality: N/A;   Social History:   reports that he quit smoking about 4 years ago. His smoking use included Cigarettes. He smoked 0.00 packs per day. He has never used smokeless tobacco. He reports that he does not drink alcohol or use illicit drugs.  Family History  Problem Relation Age of Onset  . Heart disease Mother     MI  . Lung cancer Brother     Medications: Patient's Medications  New Prescriptions   No medications on file  Previous Medications   ALBUTEROL (PROVENTIL HFA;VENTOLIN HFA) 108 (90 BASE) MCG/ACT INHALER    Inhale 2 puffs into the lungs 2 (two) times daily as needed for wheezing.    ALPRAZOLAM (XANAX) 1 MG TABLET    Take 1 mg by mouth 2 (two) times daily.   FERROUS SULFATE 325 (65 FE) MG TABLET    Take 325 mg by mouth at bedtime.   FOLIC ACID (FOLVITE) 1 MG TABLET    Take 1 mg by mouth daily.   FUROSEMIDE (LASIX) 40 MG TABLET    Take 80 mg by mouth 2 (two) times daily.    INSULIN ASPART (NOVOLOG) 100 UNIT/ML INJECTION    Inject 0-5 Units into the skin See admin instructions. 0-149 = 0 units; 150+ = 5 units 15 minutes  before or 30 minutes after a meal.  Subcutaneously before meals for dm.   INSULIN GLARGINE (LANTUS) 100 UNIT/ML INJECTION    Inject 60 Units into the skin at bedtime.   LACTULOSE (CHRONULAC) 10 GM/15ML SOLUTION    Take 45 mLs (30 g total) by mouth 5 (five) times daily. Hold if more than 5 stools in one day  LEVETIRACETAM (KEPPRA) 500 MG TABLET    Take 500 mg by mouth 2 (two) times daily.   LEVOTHYROXINE (SYNTHROID, LEVOTHROID) 75 MCG TABLET    Take 75 mcg by mouth daily.   OMEPRAZOLE (PRILOSEC) 20 MG CAPSULE    Take 20 mg by mouth daily.   OXYCODONE (OXY IR/ROXICODONE) 5 MG IMMEDIATE RELEASE TABLET    Take 10 mg by mouth 5 (five) times daily.   POTASSIUM CHLORIDE (K-DUR) 10 MEQ TABLET    Take 1 tablet (10 mEq total) by mouth daily.   RIFAXIMIN (XIFAXAN) 550 MG TABS    Take 550 mg by mouth 2 (two) times daily.   RISPERIDONE (RISPERDAL) 0.25 MG TABLET    Take 1 tablet (0.25 mg total) by mouth 2 (two) times daily. Hold for sedation   SPIRONOLACTONE (ALDACTONE) 100 MG TABLET    Take 200 mg by mouth daily.  Modified Medications   No medications on file  Discontinued Medications   No medications on file     Physical Exam:  Filed Vitals:   10/02/12 1156  BP: 150/62  Pulse: 97  Temp: 98.1 F (36.7 C)  TempSrc: Oral  Resp: 20  Height: 5\' 8"  (1.727 m)  Weight: 302 lb (136.986 kg)  SpO2: 94%    Physical Exam Constitutional: Obese Caucasian male, mildly jaundiced HENT:  Head: Normocephalic and atraumatic.  Nose: Nose normal.  Eyes: Conjunctivae normal. Pupils are dilated and minimally reactive to light, no eye discharge. Not following finger movement test. Scleral icterus present  Neck: Neck supple. No JVD present. No tracheal deviation present.  Cardiovascular: Normal rate, regular rhythm, normal heart sounds  Pulmonary/Chest: Effort normal and breath sounds normal.  Abdominal: Soft. Bowel sounds are normal. He exhibits distension. He exhibits no mass. There is no tenderness. There is  no rebound and no guarding.  Musculoskeletal: able to move all his extremities by himself, using walker Lymphadenopathy: He has no cervical adenopathy.  Skin: Skin is warm and dry. Chronic venous stasis changes   Labs reviewed: Basic Metabolic Panel:  Recent Labs  56/21/30 0455  08/12/12 1135  08/25/12 2100  09/17/12 0146  09/22/12 0600 09/23/12 0430 09/30/12 0454  NA 128*  < >  --   < >  --   < > 120*  < > 124* 122* 130*  K 4.6  < >  --   < >  --   < > 4.0  < > 3.6 4.3 2.5*  CL 98  < >  --   < >  --   < > 89*  < > 95* 92* 95*  CO2 21  < >  --   < >  --   < > 23  < > 23 27 25   GLUCOSE 159*  < >  --   < >  --   < > 155*  < > 72 135* 42*  BUN 7  < >  --   < >  --   < > 13  < > 8 8 9   CREATININE 0.61  < >  --   < >  --   < > 0.79  < > 0.67 0.78 0.69  CALCIUM 8.0*  < >  --   < >  --   < > 8.5  < > 7.9* 7.9* 8.3*  MG  --   --   --   --  1.5  --   --   --   --   --  1.6  TSH 1.403  --  2.323  --   --   --  2.243  --   --   --   --   < > = values in this interval not displayed. Liver Function Tests:  Recent Labs  09/16/12 2040 09/18/12 0530 09/30/12 0454  AST 68* 62* 60*  ALT 49 50 41  ALKPHOS 204* 211* 219*  BILITOT 4.9* 3.2* 2.2*  PROT 6.3 6.4 6.5  ALBUMIN 2.2* 2.3* 2.4*    Recent Labs  09/17/12 0146 09/30/12 0454  LIPASE 29 47    Recent Labs  09/17/12 0518 09/20/12 0515 09/30/12 0454  AMMONIA 33 64* 74*   CBC:  Recent Labs  09/16/12 2040 09/17/12 0146  09/18/12 0530 09/21/12 0630 09/30/12 0454  WBC 4.9 5.5  < > 5.3 3.4* 8.4  NEUTROABS 3.5 3.5  --   --   --  6.6  HGB 10.6* 11.7*  < > 10.7* 11.2* 11.9*  HCT 28.3* 30.4*  < > 28.8* 30.3* 32.6*  MCV 95.6 95.6  < > 98.0 98.1 101.2*  PLT 39* 41*  < > 44* 32* 33*  < > = values in this interval not displayed. Lipid Panel: No results found for this basename: CHOL, HDL, LDLCALC, TRIG, CHOLHDL, LDLDIRECT,  in the last 8760 hours  Past Procedures:     Assessment/Plan 1. Anemia Will follow up  - CBC  With differential/Platelet  2. Diabetes mellitus Cont taking CBG before meals and at bedtime and record and bring log to next visit Decrease lantus to 40 units qhs To follow up with cathy in 1 week - Comprehensive metabolic panel - Ambulatory referral to Home Health  3. Generalized weakness To work with home Health PT/OT  4. Anxiety state, unspecified Will give refill for xanax 0.25 mg qhs  5. Shortness of breath Reports ongoing shortness of breath diagnosis of COPD but has not been evaluated by pulmonolgist. Will get Ambulatory referral to Pulmonology

## 2012-10-02 NOTE — Patient Instructions (Addendum)
Will have you follow up with [facility] on Monday for diabetic teaching Cont taking CBG before meals and at bedtime and record and bring log to next visit Decrease lantus to 40 units qhs

## 2012-10-03 LAB — COMPREHENSIVE METABOLIC PANEL
ALT: 37 IU/L (ref 0–44)
AST: 69 IU/L — ABNORMAL HIGH (ref 0–40)
CO2: 23 mmol/L (ref 18–29)
Calcium: 7.5 mg/dL — ABNORMAL LOW (ref 8.7–10.2)
Creatinine, Ser: 0.6 mg/dL — ABNORMAL LOW (ref 0.76–1.27)
Globulin, Total: 3.5 g/dL (ref 1.5–4.5)
Glucose: 86 mg/dL (ref 65–99)
Potassium: 3.3 mmol/L — ABNORMAL LOW (ref 3.5–5.2)
Sodium: 136 mmol/L (ref 134–144)
Total Protein: 5.9 g/dL — ABNORMAL LOW (ref 6.0–8.5)

## 2012-10-05 DIAGNOSIS — I509 Heart failure, unspecified: Secondary | ICD-10-CM

## 2012-10-05 DIAGNOSIS — E1165 Type 2 diabetes mellitus with hyperglycemia: Secondary | ICD-10-CM

## 2012-10-05 DIAGNOSIS — Z9181 History of falling: Secondary | ICD-10-CM

## 2012-10-05 DIAGNOSIS — K7030 Alcoholic cirrhosis of liver without ascites: Secondary | ICD-10-CM

## 2012-10-06 ENCOUNTER — Emergency Department (HOSPITAL_COMMUNITY): Payer: Medicare Other

## 2012-10-06 ENCOUNTER — Inpatient Hospital Stay (HOSPITAL_COMMUNITY)
Admission: EM | Admit: 2012-10-06 | Discharge: 2012-10-10 | DRG: 813 | Disposition: A | Discharge status: Skilled Nursing Facility | Payer: Medicare Other | Attending: Internal Medicine | Admitting: Internal Medicine

## 2012-10-06 ENCOUNTER — Encounter (HOSPITAL_COMMUNITY): Payer: Self-pay | Admitting: Emergency Medicine

## 2012-10-06 ENCOUNTER — Inpatient Hospital Stay (HOSPITAL_COMMUNITY): Payer: Medicare Other

## 2012-10-06 DIAGNOSIS — Z794 Long term (current) use of insulin: Secondary | ICD-10-CM

## 2012-10-06 DIAGNOSIS — E8779 Other fluid overload: Secondary | ICD-10-CM | POA: Diagnosis not present

## 2012-10-06 DIAGNOSIS — R601 Generalized edema: Secondary | ICD-10-CM

## 2012-10-06 DIAGNOSIS — E440 Moderate protein-calorie malnutrition: Secondary | ICD-10-CM | POA: Diagnosis present

## 2012-10-06 DIAGNOSIS — D638 Anemia in other chronic diseases classified elsewhere: Secondary | ICD-10-CM

## 2012-10-06 DIAGNOSIS — R188 Other ascites: Secondary | ICD-10-CM | POA: Diagnosis not present

## 2012-10-06 DIAGNOSIS — N5089 Other specified disorders of the male genital organs: Secondary | ICD-10-CM

## 2012-10-06 DIAGNOSIS — Z933 Colostomy status: Secondary | ICD-10-CM

## 2012-10-06 DIAGNOSIS — E119 Type 2 diabetes mellitus without complications: Secondary | ICD-10-CM | POA: Diagnosis not present

## 2012-10-06 DIAGNOSIS — F1010 Alcohol abuse, uncomplicated: Secondary | ICD-10-CM | POA: Diagnosis present

## 2012-10-06 DIAGNOSIS — K219 Gastro-esophageal reflux disease without esophagitis: Secondary | ICD-10-CM | POA: Diagnosis present

## 2012-10-06 DIAGNOSIS — E876 Hypokalemia: Secondary | ICD-10-CM | POA: Diagnosis present

## 2012-10-06 DIAGNOSIS — R5383 Other fatigue: Secondary | ICD-10-CM | POA: Diagnosis present

## 2012-10-06 DIAGNOSIS — R609 Edema, unspecified: Secondary | ICD-10-CM

## 2012-10-06 DIAGNOSIS — R5381 Other malaise: Secondary | ICD-10-CM | POA: Diagnosis present

## 2012-10-06 DIAGNOSIS — D696 Thrombocytopenia, unspecified: Secondary | ICD-10-CM

## 2012-10-06 DIAGNOSIS — T502X5A Adverse effect of carbonic-anhydrase inhibitors, benzothiadiazides and other diuretics, initial encounter: Secondary | ICD-10-CM | POA: Diagnosis present

## 2012-10-06 DIAGNOSIS — F411 Generalized anxiety disorder: Secondary | ICD-10-CM

## 2012-10-06 DIAGNOSIS — R531 Weakness: Secondary | ICD-10-CM

## 2012-10-06 DIAGNOSIS — E873 Alkalosis: Secondary | ICD-10-CM | POA: Diagnosis present

## 2012-10-06 DIAGNOSIS — E1165 Type 2 diabetes mellitus with hyperglycemia: Secondary | ICD-10-CM | POA: Diagnosis present

## 2012-10-06 DIAGNOSIS — E039 Hypothyroidism, unspecified: Secondary | ICD-10-CM | POA: Diagnosis present

## 2012-10-06 DIAGNOSIS — D6959 Other secondary thrombocytopenia: Principal | ICD-10-CM | POA: Diagnosis present

## 2012-10-06 DIAGNOSIS — E1149 Type 2 diabetes mellitus with other diabetic neurological complication: Secondary | ICD-10-CM | POA: Diagnosis present

## 2012-10-06 DIAGNOSIS — Z6841 Body Mass Index (BMI) 40.0 and over, adult: Secondary | ICD-10-CM

## 2012-10-06 DIAGNOSIS — F1021 Alcohol dependence, in remission: Secondary | ICD-10-CM

## 2012-10-06 DIAGNOSIS — E669 Obesity, unspecified: Secondary | ICD-10-CM | POA: Diagnosis present

## 2012-10-06 DIAGNOSIS — F319 Bipolar disorder, unspecified: Secondary | ICD-10-CM | POA: Diagnosis present

## 2012-10-06 DIAGNOSIS — E43 Unspecified severe protein-calorie malnutrition: Secondary | ICD-10-CM | POA: Diagnosis present

## 2012-10-06 DIAGNOSIS — R600 Localized edema: Secondary | ICD-10-CM

## 2012-10-06 DIAGNOSIS — E8770 Fluid overload, unspecified: Secondary | ICD-10-CM

## 2012-10-06 DIAGNOSIS — I739 Peripheral vascular disease, unspecified: Secondary | ICD-10-CM | POA: Diagnosis present

## 2012-10-06 DIAGNOSIS — K7290 Hepatic failure, unspecified without coma: Secondary | ICD-10-CM

## 2012-10-06 DIAGNOSIS — Z515 Encounter for palliative care: Secondary | ICD-10-CM

## 2012-10-06 DIAGNOSIS — Z87891 Personal history of nicotine dependence: Secondary | ICD-10-CM

## 2012-10-06 DIAGNOSIS — M129 Arthropathy, unspecified: Secondary | ICD-10-CM | POA: Diagnosis present

## 2012-10-06 DIAGNOSIS — G40909 Epilepsy, unspecified, not intractable, without status epilepticus: Secondary | ICD-10-CM | POA: Diagnosis present

## 2012-10-06 DIAGNOSIS — E1142 Type 2 diabetes mellitus with diabetic polyneuropathy: Secondary | ICD-10-CM | POA: Diagnosis present

## 2012-10-06 DIAGNOSIS — D689 Coagulation defect, unspecified: Secondary | ICD-10-CM | POA: Diagnosis present

## 2012-10-06 DIAGNOSIS — K7460 Unspecified cirrhosis of liver: Secondary | ICD-10-CM

## 2012-10-06 DIAGNOSIS — E871 Hypo-osmolality and hyponatremia: Secondary | ICD-10-CM | POA: Diagnosis present

## 2012-10-06 DIAGNOSIS — N189 Chronic kidney disease, unspecified: Secondary | ICD-10-CM | POA: Diagnosis present

## 2012-10-06 DIAGNOSIS — D684 Acquired coagulation factor deficiency: Secondary | ICD-10-CM | POA: Diagnosis present

## 2012-10-06 DIAGNOSIS — K7682 Hepatic encephalopathy: Secondary | ICD-10-CM

## 2012-10-06 LAB — URINALYSIS, ROUTINE W REFLEX MICROSCOPIC
Bilirubin Urine: NEGATIVE
Glucose, UA: NEGATIVE mg/dL
Hgb urine dipstick: NEGATIVE
Specific Gravity, Urine: 1.015 (ref 1.005–1.030)
Urobilinogen, UA: 1 mg/dL (ref 0.0–1.0)
pH: 5.5 (ref 5.0–8.0)

## 2012-10-06 LAB — CBC WITH DIFFERENTIAL/PLATELET
HCT: 30.2 % — ABNORMAL LOW (ref 39.0–52.0)
Hemoglobin: 10.4 g/dL — ABNORMAL LOW (ref 13.0–17.0)
Lymphocytes Relative: 12 % (ref 12–46)
Lymphs Abs: 0.5 10*3/uL — ABNORMAL LOW (ref 0.7–4.0)
Monocytes Relative: 9 % (ref 3–12)
Neutro Abs: 3.4 10*3/uL (ref 1.7–7.7)
Neutrophils Relative %: 77 % (ref 43–77)
RBC: 2.89 MIL/uL — ABNORMAL LOW (ref 4.22–5.81)

## 2012-10-06 LAB — COMPREHENSIVE METABOLIC PANEL
ALT: 35 U/L (ref 0–53)
AST: 72 U/L — ABNORMAL HIGH (ref 0–37)
Alkaline Phosphatase: 185 U/L — ABNORMAL HIGH (ref 39–117)
CO2: 32 mEq/L (ref 19–32)
Calcium: 8 mg/dL — ABNORMAL LOW (ref 8.4–10.5)
Chloride: 99 mEq/L (ref 96–112)
GFR calc non Af Amer: >90 mL/min (ref >90)
Potassium: 3.2 mEq/L — ABNORMAL LOW (ref 3.5–5.1)
Sodium: 134 mEq/L — ABNORMAL LOW (ref 135–145)
Total Bilirubin: 3.8 mg/dL — ABNORMAL HIGH (ref 0.3–1.2)

## 2012-10-06 LAB — POCT I-STAT, CHEM 8
BUN: 6 mg/dL (ref 6–23)
Chloride: 96 mEq/L (ref 96–112)
Creatinine, Ser: 0.8 mg/dL (ref 0.50–1.35)
Glucose, Bld: 128 mg/dL — ABNORMAL HIGH (ref 70–99)
Potassium: 3 mEq/L — ABNORMAL LOW (ref 3.5–5.1)
Sodium: 138 mEq/L (ref 135–145)

## 2012-10-06 LAB — GLUCOSE, CAPILLARY: Glucose-Capillary: 176 mg/dL — ABNORMAL HIGH (ref 70–99)

## 2012-10-06 LAB — ETHANOL: Alcohol, Ethyl (B): <11 mg/dL (ref 0–11)

## 2012-10-06 LAB — PROTIME-INR: Prothrombin Time: 20.8 seconds — ABNORMAL HIGH (ref 11.6–15.2)

## 2012-10-06 MED ORDER — LACTULOSE 10 GM/15ML PO SOLN
30.0000 g | Freq: Every day | ORAL | Status: DC
  Administered 2012-10-06 – 2012-10-10 (×19): 30 g via ORAL
  Filled 2012-10-06 (×23): qty 45

## 2012-10-06 MED ORDER — OXYCODONE HCL 10 MG PO TABS: 10.0000 mg | ORAL_TABLET | ORAL | Status: DC

## 2012-10-06 MED ORDER — INSULIN ASPART 100 UNIT/ML ~~LOC~~ SOLN
0.0000 [IU] | Freq: Three times a day (TID) | SUBCUTANEOUS | Status: DC
  Administered 2012-10-06 – 2012-10-08 (×4): 2 [IU] via SUBCUTANEOUS
  Administered 2012-10-08 (×2): 3 [IU] via SUBCUTANEOUS
  Administered 2012-10-09 (×2): 5 [IU] via SUBCUTANEOUS
  Administered 2012-10-09: 2 [IU] via SUBCUTANEOUS
  Administered 2012-10-10 (×2): 3 [IU] via SUBCUTANEOUS

## 2012-10-06 MED ORDER — SODIUM CHLORIDE 0.9 % IJ SOLN: 3.0000 mL | Freq: Two times a day (BID) | INTRAMUSCULAR | Status: DC

## 2012-10-06 MED ORDER — FUROSEMIDE 10 MG/ML IJ SOLN
80.0000 mg | Freq: Four times a day (QID) | INTRAMUSCULAR | Status: DC
  Administered 2012-10-06 – 2012-10-08 (×9): 80 mg via INTRAVENOUS
  Filled 2012-10-06 (×11): qty 8

## 2012-10-06 MED ORDER — OXYCODONE HCL 5 MG PO TABS
10.0000 mg | ORAL_TABLET | ORAL | Status: DC
  Administered 2012-10-06 – 2012-10-10 (×23): 10 mg via ORAL
  Filled 2012-10-06 (×23): qty 2

## 2012-10-06 MED ORDER — SPIRONOLACTONE 100 MG PO TABS
100.0000 mg | ORAL_TABLET | Freq: Every day | ORAL | Status: DC
  Administered 2012-10-07 – 2012-10-10 (×4): 100 mg via ORAL
  Filled 2012-10-06 (×4): qty 1

## 2012-10-06 MED ORDER — HYDROMORPHONE HCL PF 2 MG/ML IJ SOLN
2.0000 mg | INTRAMUSCULAR | Status: AC | PRN
  Administered 2012-10-06 (×3): 2 mg via INTRAVENOUS
  Filled 2012-10-06 (×3): qty 1

## 2012-10-06 MED ORDER — ONDANSETRON HCL 4 MG/2ML IJ SOLN
4.0000 mg | Freq: Once | INTRAMUSCULAR | Status: AC
  Administered 2012-10-06: 4 mg via INTRAVENOUS
  Filled 2012-10-06: qty 2

## 2012-10-06 MED ORDER — SPIRONOLACTONE 100 MG PO TABS
200.0000 mg | ORAL_TABLET | Freq: Every day | ORAL | Status: DC
  Filled 2012-10-06: qty 2

## 2012-10-06 MED ORDER — RISPERIDONE 0.25 MG PO TABS
0.2500 mg | ORAL_TABLET | Freq: Two times a day (BID) | ORAL | Status: DC
  Administered 2012-10-06 – 2012-10-10 (×8): 0.25 mg via ORAL
  Filled 2012-10-06 (×9): qty 1

## 2012-10-06 MED ORDER — LEVETIRACETAM 500 MG PO TABS
500.0000 mg | ORAL_TABLET | Freq: Two times a day (BID) | ORAL | Status: DC
  Administered 2012-10-06 – 2012-10-10 (×8): 500 mg via ORAL
  Filled 2012-10-06 (×9): qty 1

## 2012-10-06 MED ORDER — FUROSEMIDE 10 MG/ML IJ SOLN
80.0000 mg | Freq: Once | INTRAMUSCULAR | Status: AC
  Administered 2012-10-06: 80 mg via INTRAVENOUS
  Filled 2012-10-06: qty 4

## 2012-10-06 MED ORDER — INSULIN GLARGINE 100 UNIT/ML SOLOSTAR PEN: 10.0000 [IU] | PEN_INJECTOR | Freq: Every day | SUBCUTANEOUS | Status: DC

## 2012-10-06 MED ORDER — SPIRONOLACTONE 100 MG PO TABS
200.0000 mg | ORAL_TABLET | ORAL | Status: AC
  Administered 2012-10-06: 200 mg via ORAL
  Filled 2012-10-06: qty 2

## 2012-10-06 MED ORDER — INSULIN ASPART 100 UNIT/ML ~~LOC~~ SOLN
0.0000 [IU] | Freq: Every day | SUBCUTANEOUS | Status: DC
  Administered 2012-10-09: 2 [IU] via SUBCUTANEOUS

## 2012-10-06 MED ORDER — SODIUM CHLORIDE 0.9 % IV SOLN: 250.0000 mL | INTRAVENOUS | Status: DC | PRN

## 2012-10-06 MED ORDER — POTASSIUM CHLORIDE CRYS ER 10 MEQ PO TBCR
10.0000 meq | EXTENDED_RELEASE_TABLET | Freq: Two times a day (BID) | ORAL | Status: DC
  Administered 2012-10-06: 10 meq via ORAL
  Filled 2012-10-06 (×3): qty 1

## 2012-10-06 MED ORDER — LEVOTHYROXINE SODIUM 75 MCG PO TABS
75.0000 ug | ORAL_TABLET | Freq: Every day | ORAL | Status: DC
  Administered 2012-10-07 – 2012-10-10 (×3): 75 ug via ORAL
  Filled 2012-10-06 (×6): qty 1

## 2012-10-06 MED ORDER — PANTOPRAZOLE SODIUM 40 MG PO TBEC
40.0000 mg | DELAYED_RELEASE_TABLET | Freq: Every day | ORAL | Status: DC
  Administered 2012-10-07 – 2012-10-10 (×4): 40 mg via ORAL
  Filled 2012-10-06 (×4): qty 1

## 2012-10-06 MED ORDER — SODIUM CHLORIDE 0.9 % IJ SOLN: 3.0000 mL | INTRAMUSCULAR | Status: DC | PRN

## 2012-10-06 MED ORDER — ALPRAZOLAM 1 MG PO TABS: 1.0000 mg | ORAL_TABLET | Freq: Every evening | ORAL | Status: DC | PRN

## 2012-10-06 MED ORDER — POLYETHYLENE GLYCOL 3350 17 G PO PACK
17.0000 g | PACK | Freq: Every day | ORAL | Status: DC | PRN
  Filled 2012-10-06: qty 1

## 2012-10-06 MED ORDER — HYDROMORPHONE HCL PF 2 MG/ML IJ SOLN: 2.0000 mg | INTRAMUSCULAR | Status: DC | PRN

## 2012-10-06 MED ORDER — INSULIN ASPART 100 UNIT/ML ~~LOC~~ SOLN
6.0000 [IU] | Freq: Three times a day (TID) | SUBCUTANEOUS | Status: DC
  Administered 2012-10-07 – 2012-10-10 (×10): 6 [IU] via SUBCUTANEOUS

## 2012-10-06 MED ORDER — INSULIN ASPART 100 UNIT/ML FLEXPEN: 5.0000 [IU] | PEN_INJECTOR | Freq: Three times a day (TID) | SUBCUTANEOUS | Status: DC

## 2012-10-06 MED ORDER — FOLIC ACID 1 MG PO TABS
1.0000 mg | ORAL_TABLET | Freq: Every day | ORAL | Status: DC
  Administered 2012-10-07 – 2012-10-10 (×4): 1 mg via ORAL
  Filled 2012-10-06 (×4): qty 1

## 2012-10-06 MED ORDER — RIFAXIMIN 550 MG PO TABS
550.0000 mg | ORAL_TABLET | Freq: Two times a day (BID) | ORAL | Status: DC
  Administered 2012-10-06 – 2012-10-10 (×8): 550 mg via ORAL
  Filled 2012-10-06 (×9): qty 1

## 2012-10-06 MED ORDER — ONDANSETRON HCL 4 MG/2ML IJ SOLN: 4.0000 mg | Freq: Four times a day (QID) | INTRAMUSCULAR | Status: DC | PRN

## 2012-10-06 MED ORDER — ONDANSETRON HCL 4 MG PO TABS: 4.0000 mg | ORAL_TABLET | Freq: Four times a day (QID) | ORAL | Status: DC | PRN

## 2012-10-06 MED ORDER — SPIRONOLACTONE 100 MG PO TABS: 100.0000 mg | ORAL_TABLET | Freq: Every day | ORAL | Status: DC

## 2012-10-06 MED ORDER — ONDANSETRON HCL 4 MG/2ML IJ SOLN: 4.0000 mg | Freq: Three times a day (TID) | INTRAMUSCULAR | Status: DC | PRN

## 2012-10-06 MED ORDER — HYDROMORPHONE HCL PF 1 MG/ML IJ SOLN: 1.0000 mg | INTRAMUSCULAR | Status: DC | PRN

## 2012-10-06 MED ORDER — INSULIN GLARGINE 100 UNIT/ML ~~LOC~~ SOLN
10.0000 [IU] | Freq: Every day | SUBCUTANEOUS | Status: DC
  Administered 2012-10-06 – 2012-10-09 (×4): 10 [IU] via SUBCUTANEOUS
  Filled 2012-10-06 (×5): qty 0.1

## 2012-10-06 MED ORDER — FERROUS SULFATE 325 (65 FE) MG PO TABS
325.0000 mg | ORAL_TABLET | Freq: Every day | ORAL | Status: DC
  Administered 2012-10-06 – 2012-10-09 (×4): 325 mg via ORAL
  Filled 2012-10-06 (×5): qty 1

## 2012-10-06 NOTE — ED Notes (Signed)
PER EMS- pt picked up from [place] with c/o groin swelling and pain x1 day. Pt also c/o leg swelling.

## 2012-10-06 NOTE — ED Notes (Signed)
Pt is a difficult stick, CN Faith Hilda Lias attempted IV start and was unsuccessful.  IV rn paged.

## 2012-10-06 NOTE — H&P (Signed)
[facility] History and Physical  Mohamad Bruso UJW:[id number] DOB: [birth year] DOA: 10/06/2012  Referring physician: Dr. Anitra Lauth PCP: [name], DO  Specialists: none  Chief Complaint: swelling of legs and scrotum  HPI: Tony Boyd is a 57 y.o. male  Past medical history of cirrhosis of the liver with a meld score of 31 due to hepatitis C and alcohol abuse, esophageal varices, bipolar disorder, recently discharged from the hospital on 09/17/2011 for encephalopathy and diabetes type 2 that comes in for lower extremity swelling and scrotal swelling. It got so bad that he could not sit due to the scrotal swelling, he was also not able to urinate due to the scrotal swelling so he came here to the ED. He also relays some mild dyspnea on exertion He relates no fever or chills cough shortness of breath nausea or vomiting. He does relate scrotal discomfort. He relates no episodes of confusion, or seizures., Denies any melena as prior blood per rectum or hematemesis. She relates she has been compliant with his diet he is not consuming any salt, and is restricted to less than 2 L of fluid total in a 24-hour period. He relates he is been taking his spironolactone and Lasix.   In the ED: Basic metabolic panel was done that showed mild hypokalemia, his hemoglobin is 9.9, his LFTs are mildly elevated the AST twice as much and ALT, thrombocytopenia and elevated INR 1.8 so we're consulted for further evaluation.  Review of Systems: The patient denies anorexia, fever, weight loss,, vision loss, decreased hearing, hoarseness, chest pain, syncope,, balance deficits, hemoptysis, abdominal pain, melena, hematochezia, severe indigestion/heartburn, hematuria, incontinence, muscle weakness, suspicious skin lesions, transient blindness, difficulty walking, depression, unusual weight change, abnormal bleeding, enlarged lymph nodes, angioedema, and breast masses.    Past Medical History  Diagnosis Date  .  Cirrhosis of liver   . Hepatitis C   . ETOH abuse   . Hypothyroid   . Psychosis   . Bipolar disorder   . Seizures   . Arthritis   . Back pain   . Coagulopathy     Hx of  . Thrombocytopenia     Hx of  . Pancytopenia     Hx of  . H/O hypokalemia   . Hyponatremia     Hx of  . Korsakoff psychosis   . H/O abdominal abscess   . H/O esophageal varices   . Metabolic encephalopathy   . Hepatic encephalopathy   . Urinary urgency   . H/O renal failure   . H/O ascites   . Altered mental status   . Anemia   . Ascites   . Shortness of breath   . Peripheral vascular disease   . GERD (gastroesophageal reflux disease)   . Thrombocytopenia, acquired 06/02/2012  . Unspecified hypothyroidism 06/02/2012  . Pain in joint, pelvic region and thigh   . Edema   . Obesity, unspecified   . Chest pain, unspecified   . Pneumonitis due to inhalation of food or vomitus   . Other convulsions   . Cellulitis and abscess of upper arm and forearm   . Chronic hepatitis C without mention of hepatic coma   . Hypopotassemia   . Anemia, unspecified   . Other and unspecified coagulation defects   . Thrombocytopenia, unspecified   . Bipolar I disorder, most recent episode (or current) unspecified   . Unspecified psychosis   . Encephalopathy   . Esophageal varices without mention of bleeding   . Esophagitis, unspecified   .  Abscess of liver(572.0)   . Chronic kidney disease, unspecified   . Lumbago   . Personal history of alcoholism   . Personal history of tobacco use, presenting hazards to health   . Cirrhosis of liver without mention of alcohol   . Pneumonitis due to inhalation of food or vomitus 09/19/2010  . Chronic hepatitis C without mention of hepatic coma   . Bipolar I disorder, most recent episode (or current) unspecified   . Unspecified psychosis   . Other ascites   . Personal history of tobacco use, presenting hazards to health   . Cirrhosis of liver without mention of alcohol   . Type 2  diabetes mellitus with diabetic neuropathy    Past Surgical History  Procedure Laterality Date  . Colostomy    . Cholecystectomy    . Small intestine surgery    . Arm surgery      left arm  . US guided drain placement in ventral hernia abscess  07/21/2010  . Multiple picc line placements    . Esophagogastroduodenoscopy  06/28/2011    Procedure: ESOPHAGOGASTRODUODENOSCOPY (EGD);  Surgeon: Louis Meckel, MD;  Location: [facility];  Service: Endoscopy;  Laterality: N/A;  . Esophagogastroduodenoscopy N/A 05/06/2012    Procedure: ESOPHAGOGASTRODUODENOSCOPY (EGD);  Surgeon: Louis Meckel, MD;  Location: [facility];  Service: Endoscopy;  Laterality: N/A;  . Esophageal banding N/A 05/06/2012    Procedure: ESOPHAGEAL BANDING;  Surgeon: Louis Meckel, MD;  Location: [facility];  Service: Endoscopy;  Laterality: N/A;   Social History:  reports that he quit smoking about 4 years ago. His smoking use included Cigarettes. He smoked 0.00 packs per day. He has never used smokeless tobacco. He reports that he does not drink alcohol or use illicit drugs.   Allergies  Allergen Reactions  . Ativan (Lorazepam) Other (See Comments)    "hallucinations"  . Droperidol Other (See Comments)    unknown  . Ketorolac Other (See Comments)    unknown  . Penicillins Swelling and Other (See Comments)    unkown  . Toradol (Ketorolac Tromethamine) Hives    Family History  Problem Relation Age of Onset  . Heart disease Mother     MI  . Lung cancer Brother     Prior to Admission medications   Medication Sig Start Date End Date Taking? Authorizing Provider  ALPRAZolam Prudy Feeler) 1 MG tablet Take 1 tablet (1 mg total) by mouth at bedtime as needed for sleep. 10/02/12  Yes Claudie Revering, NP  ferrous sulfate 325 (65 FE) MG tablet Take 325 mg by mouth at bedtime.   Yes Historical Provider, MD  folic acid (FOLVITE) 1 MG tablet Take 1 mg by mouth daily.   Yes Historical Provider, MD  furosemide (LASIX) 80 MG  tablet Take 80 mg by mouth 2 (two) times daily.   Yes Historical Provider, MD  insulin aspart (NOVOLOG FLEXPEN) 100 UNIT/ML SOPN FlexPen Inject 5 Units into the skin 3 (three) times daily with meals. If CBG is 149 or below, hold   Yes Historical Provider, MD  Insulin Glargine (LANTUS SOLOSTAR) 100 UNIT/ML SOPN Inject 25 Units into the skin at bedtime.    Yes Historical Provider, MD  lactulose (CHRONULAC) 10 GM/15ML solution Take 45 mLs (30 g total) by mouth 5 (five) times daily. Hold if more than 5 stools in one day 09/22/12  Yes Christiane Ha, MD  levETIRAcetam (KEPPRA) 500 MG tablet Take 500 mg by mouth 2 (two) times daily.  Yes Historical Provider, MD  levothyroxine (SYNTHROID, LEVOTHROID) 75 MCG tablet Take 75 mcg by mouth daily.   Yes Historical Provider, MD  omeprazole (PRILOSEC) 20 MG capsule Take 20 mg by mouth daily.   Yes Historical Provider, MD  Oxycodone HCl 10 MG TABS Take 10 mg by mouth every 4 (four) hours.    Yes Historical Provider, MD  potassium chloride (MICRO-K) 10 MEQ CR capsule Take 10 mEq by mouth 2 (two) times daily.   Yes Historical Provider, MD  rifaximin (XIFAXAN) 550 MG TABS Take 550 mg by mouth 2 (two) times daily. 01/26/12  Yes Alison Murray, MD  risperiDONE (RISPERDAL) 0.25 MG tablet Take 1 tablet (0.25 mg total) by mouth 2 (two) times daily. Hold for sedation 09/22/12  Yes Christiane Ha, MD   Physical Exam: Filed Vitals:   10/06/12 1458  BP: 122/62  Pulse: 89  Temp:   Resp: 20   BP 122/62  Pulse 89  Temp(Src) 97.6 F (36.4 C) (Oral)  Resp 20  SpO2 99%  General Appearance:    Alert, cooperative, no distress, appears stated age  Head:    Normocephalic, without obvious abnormality, atraumatic           Throat:   Lips, mucosa, and tongue normal; mild icterus   Neck:   Supple, symmetrical, trachea midline, no adenopathy;       thyroid:  No enlargement/tenderness/nodules; no carotid   bruit or JVD  Back:     Symmetric, no curvature, ROM normal,  no CVA tenderness, 3+ edema of the sacrum   Lungs:     Clear to auscultation bilaterally, respirations unlabored  Chest wall:    No tenderness or deformity, swollen with 2+ pitting edema   Heart:    Regular rate and rhythm, S1 and S2 normal, no murmur, rub   or gallop  Abdomen:     Soft, non-tender, bowel sounds active all four quadrants,    no masses, no organomegaly     Rectal:    scrotum is massively swollen  Extremities:   Extremities normal, atraumatic, no cyanosis or edema  Pulses:   2+ and symmetric all extremities  Skin:   Skin color, telangiectasia   Lymph nodes:   Cervical, supraclavicular, and axillary nodes normal  Neurologic:   CNII-XII intact. Normal strength, sensation and reflexes      throughout, slow to response.     Labs on Admission:  Basic Metabolic Panel:  Recent Labs Lab 09/30/12 0454 10/02/12 1355 10/06/12 1240 10/06/12 1253  NA 130* 136 134* 138  K 2.5* 3.3* 3.2* 3.0*  CL 95* 99 99 96  CO2 25 23 32  --   GLUCOSE 42* 86 133* 128*  BUN 9 9 8 6   CREATININE 0.69 0.60* 0.65 0.80  CALCIUM 8.3* 7.5* 8.0*  --   MG 1.6  --   --   --    Liver Function Tests:  Recent Labs Lab 09/30/12 0454 10/02/12 1355 10/06/12 1240  AST 60* 69* 72*  ALT 41 37 35  ALKPHOS 219* 219* 185*  BILITOT 2.2* 3.0* 3.8*  PROT 6.5 5.9* 5.5*  ALBUMIN 2.4*  --  1.9*    Recent Labs Lab 09/30/12 0454 10/06/12 1240  LIPASE 47 33    Recent Labs Lab 09/30/12 0454 10/06/12 1240  AMMONIA 74* 80*   CBC:  Recent Labs Lab 09/30/12 0454 10/06/12 1240 10/06/12 1253  WBC 8.4 4.5  --   NEUTROABS 6.6 3.4  --  HGB 11.9* 10.4* 9.9*  HCT 32.6* 30.2* 29.0*  MCV 101.2* 104.5*  --   PLT 33* 46*  --    Cardiac Enzymes: No results found for this basename: CKTOTAL, CKMB, CKMBINDEX, TROPONINI,  in the last 168 hours  BNP (last 3 results) No results found for this basename: PROBNP,  in the last 8760 hours CBG:  Recent Labs Lab 09/30/12 0640  GLUCAP 121*     Radiological Exams on Admission: US Scrotum  10/06/2012   *RADIOLOGY REPORT*  Clinical Data:  Scrotal pain and swelling  SCROTAL ULTRASOUND DOPPLER ULTRASOUND OF THE TESTICLES  Technique: Complete ultrasound examination of the testicles, epididymis, and other scrotal structures was performed.  Color and spectral Doppler ultrasound were also utilized to evaluate blood flow to the testicles.  Comparison:  None  Findings:  Right testis:  The right testicle measures 2.7 x 2.1 x 1.7 cm. Color Doppler flow is demonstrated.  Venous waveforms are present. Arterial wave forms are not demonstrated, potentially secondary to difficulty visualizing the testicle from extensive scrotal swelling.  Left testis:  The left testicle measures 4.0 x 3.2 x 2.8 cm.  Color Doppler flow is present.  Venous waveforms are present.  Arterial wave forms are not demonstrated, potentially secondary to difficulty visualizing the testicle from extensive scrotal swelling  Right epididymis:  Normal in size in appearance measuring 1.4 x 1.0 x 1.3 cm  Left epididymis:  Normal in size and appearance measuring 1.3 x 0.8 x 1.2 cm.  Hydrocele:  Small left hydrocele.  Varicocele:  Absent  Pulsed Doppler interrogation of both testes demonstrates normal venous waveforms.  Arterial wave forms are not able to be demonstrated, potentially secondary to extensive scrotal soft tissue swelling.  IMPRESSION: 1.  Marked soft tissue swelling/edema of the scrotum.  This is nonspecific and may be secondary to third spacing of fluid from history of cirrhosis or infection.  2. Note arterial waveforms are not able to be demonstrated in either testicle likely secondary to marked swelling of the scrotum. Color doppler flow is present.  Discussed with PA Pisciotta at 01:45 p.m. on 08/02 01/15/2013.   Original Report Authenticated By: Annia Belt, M.D   [place] Art/ven Flow Abd Pelv Doppler  10/06/2012   *RADIOLOGY REPORT*  Clinical Data:  Scrotal pain and swelling  SCROTAL  ULTRASOUND DOPPLER ULTRASOUND OF THE TESTICLES  Technique: Complete ultrasound examination of the testicles, epididymis, and other scrotal structures was performed.  Color and spectral Doppler ultrasound were also utilized to evaluate blood flow to the testicles.  Comparison:  None  Findings:  Right testis:  The right testicle measures 2.7 x 2.1 x 1.7 cm. Color Doppler flow is demonstrated.  Venous waveforms are present. Arterial wave forms are not demonstrated, potentially secondary to difficulty visualizing the testicle from extensive scrotal swelling.  Left testis:  The left testicle measures 4.0 x 3.2 x 2.8 cm.  Color Doppler flow is present.  Venous waveforms are present.  Arterial wave forms are not demonstrated, potentially secondary to difficulty visualizing the testicle from extensive scrotal swelling  Right epididymis:  Normal in size in appearance measuring 1.4 x 1.0 x 1.3 cm  Left epididymis:  Normal in size and appearance measuring 1.3 x 0.8 x 1.2 cm.  Hydrocele:  Small left hydrocele.  Varicocele:  Absent  Pulsed Doppler interrogation of both testes demonstrates normal venous waveforms.  Arterial wave forms are not able to be demonstrated, potentially secondary to extensive scrotal soft tissue swelling.  IMPRESSION: 1.  Marked  soft tissue swelling/edema of the scrotum.  This is nonspecific and may be secondary to third spacing of fluid from history of cirrhosis or infection.  2. Note arterial waveforms are not able to be demonstrated in either testicle likely secondary to marked swelling of the scrotum. Color doppler flow is present.  Discussed with PA Pisciotta at 01:45 p.m. on 08/02 01/15/2013.   Original Report Authenticated By: Annia Belt, M.D    EKG: Independently reviewed. none  Assessment/Plan Anasarca/ Scrotal swelling: - He relates he is compliant with his diet and fluid restriction. He has been taking his Lasix regularly. We'll go ahead and start on Lasix IV 80 mg 3 times a day.  Spironolactone 100 mg daily. Monitor his potassium very closely. He does have a history of hepatitis C and alcohol abuse. His meld score is 31 which puts his mortality of about 6 months.  - Monitor strict I.'s and O., daily weights. At this point in time there is no indication for albumin. Check a PT and INR.  - We'll get an abdominal ultrasound check alpha-fetoprotein (to rule out hepatocellular carcinoma).   Hepatic encephalopathy - We'll go ahead and continue his lactulose and rifaximin. He has no asterixis on physical exam he is slow to response with a slight depressed mood. He is awake alert and oriented x3. He is afebrile with no leukocyte ptosis. He is on Xanax and narcotics which could be contributing to the slow response to hold these for the next 24 hours and restart them tomorrow morning.   Ascites: - Get abdominal ultrasound, to evaluate ascites. He relates no abdominal pain diarrhea, or fevers - Also the abdominal ultrasound will help [place] identify any masses disease and high risk of developing hepatocellular carcinoma and can be contributing to his acute compensation.   Thrombocytopenia, acquired: - Currently at baseline chronic continue to monitor.   Diabetes mellitus - We'll go ahead and continue sliding scale insulin, cut his insulin long-acting dose by half. - Check a  hemoglobin A1c.   Code Status: full code.  Family Communication: none.  Disposition Plan: admit to inpatient.    Time spent: [id number] [place] [facility] Pager [phone]  If 7PM-7AM, please contact night-coverage [email or web address] Password [facility] 10/06/2012, 3:17 PM

## 2012-10-06 NOTE — Progress Notes (Signed)
Patient very confused and unable to sign up for My Chart. Briscoe Burns BSN, RN-BC Admissions RN  10/06/2012 3:43 PM

## 2012-10-06 NOTE — ED Notes (Signed)
Daughter contact information [phone]

## 2012-10-06 NOTE — ED Provider Notes (Signed)
Medical screening examination/treatment/procedure(s) were performed by non-physician practitioner and as supervising physician I was immediately available for consultation/collaboration.   Gwyneth Sprout, MD 10/06/12 2150

## 2012-10-06 NOTE — ED Notes (Signed)
[id number]<AV> Expected date:<BR> Expected time:<BR> Means of arrival:<BR> Comments:<BR> ems- 57 yo M, edema to groin and legs

## 2012-10-06 NOTE — ED Notes (Signed)
US in progress

## 2012-10-06 NOTE — ED Notes (Signed)
IV rn bedside, and Phlebotomist bedside.

## 2012-10-06 NOTE — ED Provider Notes (Signed)
CSN: [id number]     Arrival date & time 10/06/12  1114 History     First MD Initiated Contact with Patient 10/06/12 1123     Chief Complaint  Patient presents with  . Groin Pain  . Groin Swelling  . Leg Swelling   (Consider location/radiation/quality/duration/timing/severity/associated sxs/prior Treatment) HPI  Tony Boyd is a 57 y.o. male with extensive past medical history including cirrhosis, hepatitis C and alcohol abuse coming in complaining of increasing lower extremity edema, pain and associated with bilateral scrotal swelling worsening over the course last 36 hours. Patient denies fever, chest pain, nausea vomiting, shortness of breath, cough, abdominal pain, change in bowel or bladder habits. A severe, 10/10 exacerbated by walking and pressure on the scrotum.  Past Medical History  Diagnosis Date  . Cirrhosis of liver   . Hepatitis C   . ETOH abuse   . Hypothyroid   . Psychosis   . Bipolar disorder   . Seizures   . Arthritis   . Back pain   . Coagulopathy     Hx of  . Thrombocytopenia     Hx of  . Pancytopenia     Hx of  . H/O hypokalemia   . Hyponatremia     Hx of  . Korsakoff psychosis   . H/O abdominal abscess   . H/O esophageal varices   . Metabolic encephalopathy   . Hepatic encephalopathy   . Urinary urgency   . H/O renal failure   . H/O ascites   . Altered mental status   . Anemia   . Ascites   . Shortness of breath   . Peripheral vascular disease   . GERD (gastroesophageal reflux disease)   . Thrombocytopenia, acquired 06/02/2012  . Unspecified hypothyroidism 06/02/2012  . Pain in joint, pelvic region and thigh   . Edema   . Obesity, unspecified   . Chest pain, unspecified   . Pneumonitis due to inhalation of food or vomitus   . Other convulsions   . Cellulitis and abscess of upper arm and forearm   . Chronic hepatitis C without mention of hepatic coma   . Hypopotassemia   . Anemia, unspecified   . Other and unspecified coagulation  defects   . Thrombocytopenia, unspecified   . Bipolar I disorder, most recent episode (or current) unspecified   . Unspecified psychosis   . Encephalopathy   . Esophageal varices without mention of bleeding   . Esophagitis, unspecified   . Abscess of liver(572.0)   . Chronic kidney disease, unspecified   . Lumbago   . Personal history of alcoholism   . Personal history of tobacco use, presenting hazards to health   . Cirrhosis of liver without mention of alcohol   . Pneumonitis due to inhalation of food or vomitus 09/19/2010  . Chronic hepatitis C without mention of hepatic coma   . Bipolar I disorder, most recent episode (or current) unspecified   . Unspecified psychosis   . Other ascites   . Personal history of tobacco use, presenting hazards to health   . Cirrhosis of liver without mention of alcohol   . Type 2 diabetes mellitus with diabetic neuropathy    Past Surgical History  Procedure Laterality Date  . Colostomy    . Cholecystectomy    . Small intestine surgery    . Arm surgery      left arm  . US guided drain placement in ventral hernia abscess  07/21/2010  . Multiple picc line  placements    . Esophagogastroduodenoscopy  06/28/2011    Procedure: ESOPHAGOGASTRODUODENOSCOPY (EGD);  Surgeon: Louis Meckel, MD;  Location: [facility];  Service: Endoscopy;  Laterality: N/A;  . Esophagogastroduodenoscopy N/A 05/06/2012    Procedure: ESOPHAGOGASTRODUODENOSCOPY (EGD);  Surgeon: Louis Meckel, MD;  Location: [facility];  Service: Endoscopy;  Laterality: N/A;  . Esophageal banding N/A 05/06/2012    Procedure: ESOPHAGEAL BANDING;  Surgeon: Louis Meckel, MD;  Location: [facility];  Service: Endoscopy;  Laterality: N/A;   Family History  Problem Relation Age of Onset  . Heart disease Mother     MI  . Lung cancer Brother    History  Substance Use Topics  . Smoking status: Former Smoker    Types: Cigarettes    Quit date: 02/15/2008  . Smokeless tobacco: Never Used   . Alcohol Use: No    Review of Systems 10 systems reviewed and found to be negative, except as noted in the HPI   Allergies  Ativan; Droperidol; Ketorolac; Penicillins; and Toradol  Home Medications   Current Outpatient Rx  Name  Route  Sig  Dispense  Refill  . albuterol (PROVENTIL HFA;VENTOLIN HFA) 108 (90 BASE) MCG/ACT inhaler   Inhalation   Inhale 2 puffs into the lungs 2 (two) times daily as needed for wheezing.          [name] ALPRAZolam (XANAX) 1 MG tablet   Oral   Take 1 tablet (1 mg total) by mouth at bedtime as needed for sleep.   30 tablet   0   . ferrous sulfate 325 (65 FE) MG tablet   Oral   Take 325 mg by mouth at bedtime.         . folic acid (FOLVITE) 1 MG tablet   Oral   Take 1 mg by mouth daily.         . furosemide (LASIX) 40 MG tablet   Oral   Take 80 mg by mouth 2 (two) times daily.          . insulin aspart (NOVOLOG) 100 UNIT/ML injection   Subcutaneous   Inject 0-5 Units into the skin See admin instructions. 0-149 = 0 units; 150+ = 5 units 15 minutes before or 30 minutes after a meal.  Subcutaneously before meals for dm.         . insulin glargine (LANTUS) 100 UNIT/ML injection   Subcutaneous   Inject 60 Units into the skin at bedtime.         [name] lactulose (CHRONULAC) 10 GM/15ML solution   Oral   Take 45 mLs (30 g total) by mouth 5 (five) times daily. Hold if more than 5 stools in one day   240 mL   0   . levETIRAcetam (KEPPRA) 500 MG tablet   Oral   Take 500 mg by mouth 2 (two) times daily.         [name] levothyroxine (SYNTHROID, LEVOTHROID) 75 MCG tablet   Oral   Take 75 mcg by mouth daily.         [name] omeprazole (PRILOSEC) 20 MG capsule   Oral   Take 20 mg by mouth daily.         [name] oxyCODONE (OXY IR/ROXICODONE) 5 MG immediate release tablet   Oral   Take 10 mg by mouth 5 (five) times daily.         . Oxycodone HCl 10 MG TABS   Oral   Take 1 tablet (10 mg total) by  mouth every 4 (four) hours as needed.   180  tablet   0   . potassium chloride (K-DUR) 10 MEQ tablet   Oral   Take 1 tablet (10 mEq total) by mouth daily.         . rifaximin (XIFAXAN) 550 MG TABS   Oral   Take 550 mg by mouth 2 (two) times daily.         . risperiDONE (RISPERDAL) 0.25 MG tablet   Oral   Take 1 tablet (0.25 mg total) by mouth 2 (two) times daily. Hold for sedation         . spironolactone (ALDACTONE) 100 MG tablet   Oral   Take 200 mg by mouth daily.          BP 113/67  Pulse 90  Temp(Src) 97.6 F (36.4 C) (Oral)  Resp 16  SpO2 95% Physical Exam  Constitutional: No distress.  Obese, appears chronically ill  Eyes: Pupils are equal, round, and reactive to light.  Pulmonary/Chest:  For true breath, large well-healed surgical incision, midline and in the right upper quadrant,   Bowel sounds not well appreciated secondary to habitus, no tenderness or guarding  Genitourinary:  Charge nurse present during GU examination:  Bilateral testes are enlarged mildly erythematous and tender to palpation, no warmth roughly grapefruit-sized  Musculoskeletal:  Pitting edema 3+ to knee,  Hyperpigmentation consistent with venous stasis  Skin: He is not diaphoretic.    ED Course   Procedures (including critical care time)  Labs Reviewed - No data to display US Scrotum  10/06/2012   *RADIOLOGY REPORT*  Clinical Data:  Scrotal pain and swelling  SCROTAL ULTRASOUND DOPPLER ULTRASOUND OF THE TESTICLES  Technique: Complete ultrasound examination of the testicles, epididymis, and other scrotal structures was performed.  Color and spectral Doppler ultrasound were also utilized to evaluate blood flow to the testicles.  Comparison:  None  Findings:  Right testis:  The right testicle measures 2.7 x 2.1 x 1.7 cm. Color Doppler flow is demonstrated.  Venous waveforms are present. Arterial wave forms are not demonstrated, potentially secondary to difficulty visualizing the testicle from extensive scrotal swelling.  Left  testis:  The left testicle measures 4.0 x 3.2 x 2.8 cm.  Color Doppler flow is present.  Venous waveforms are present.  Arterial wave forms are not demonstrated, potentially secondary to difficulty visualizing the testicle from extensive scrotal swelling  Right epididymis:  Normal in size in appearance measuring 1.4 x 1.0 x 1.3 cm  Left epididymis:  Normal in size and appearance measuring 1.3 x 0.8 x 1.2 cm.  Hydrocele:  Small left hydrocele.  Varicocele:  Absent  Pulsed Doppler interrogation of both testes demonstrates normal venous waveforms.  Arterial wave forms are not able to be demonstrated, potentially secondary to extensive scrotal soft tissue swelling.  IMPRESSION: 1.  Marked soft tissue swelling/edema of the scrotum.  This is nonspecific and may be secondary to third spacing of fluid from history of cirrhosis or infection.  2. Note arterial waveforms are not able to be demonstrated in either testicle likely secondary to marked swelling of the scrotum. Color doppler flow is present.  Discussed with PA Kamree Wiens at 01:45 p.m. on 08/02 01/15/2013.   Original Report Authenticated By: Annia Belt, M.D   [place] Art/ven Flow Abd Pelv Doppler  10/06/2012   *RADIOLOGY REPORT*  Clinical Data:  Scrotal pain and swelling  SCROTAL ULTRASOUND DOPPLER ULTRASOUND OF THE TESTICLES  Technique: Complete ultrasound examination of the testicles,  epididymis, and other scrotal structures was performed.  Color and spectral Doppler ultrasound were also utilized to evaluate blood flow to the testicles.  Comparison:  None  Findings:  Right testis:  The right testicle measures 2.7 x 2.1 x 1.7 cm. Color Doppler flow is demonstrated.  Venous waveforms are present. Arterial wave forms are not demonstrated, potentially secondary to difficulty visualizing the testicle from extensive scrotal swelling.  Left testis:  The left testicle measures 4.0 x 3.2 x 2.8 cm.  Color Doppler flow is present.  Venous waveforms are present.  Arterial wave  forms are not demonstrated, potentially secondary to difficulty visualizing the testicle from extensive scrotal swelling  Right epididymis:  Normal in size in appearance measuring 1.4 x 1.0 x 1.3 cm  Left epididymis:  Normal in size and appearance measuring 1.3 x 0.8 x 1.2 cm.  Hydrocele:  Small left hydrocele.  Varicocele:  Absent  Pulsed Doppler interrogation of both testes demonstrates normal venous waveforms.  Arterial wave forms are not able to be demonstrated, potentially secondary to extensive scrotal soft tissue swelling.  IMPRESSION: 1.  Marked soft tissue swelling/edema of the scrotum.  This is nonspecific and may be secondary to third spacing of fluid from history of cirrhosis or infection.  2. Note arterial waveforms are not able to be demonstrated in either testicle likely secondary to marked swelling of the scrotum. Color doppler flow is present.  Discussed with PA Derrick Orris at 01:45 p.m. on 08/02 01/15/2013.   Original Report Authenticated By: Annia Belt, M.D   1. Fluid overload   2. Peripheral edema   3. Testicular swelling   4. Thrombocytopenia   5. Cirrhosis     MDM   Filed Vitals:   10/06/12 1120 10/06/12 1140 10/06/12 1245  BP: 113/67  123/67  Pulse: 90 87 86  Temp: 97.6 F (36.4 C)    TempSrc: Oral    Resp: 16 16 16   SpO2: 95%  100%     Tony Boyd is a 57 y.o. male with worsening bilateral lower extremity edema and severe scrotal swelling over the last 36 hours.  Verbal report from radiologist appreciated: He states that the patient has severely edematous testicles bilaterally.   Catheterization yielded 750 mL of urine.   Patient is mentating appropriately, blood work is roughly at his baseline. If this is simply volume overload resulting in testicular swelling and obstructive urinary retention.  Patient will be admitted to a MedSurg bed under the care of [facility] hospitalist Dr. Radonna Ricker.   Medications  HYDROmorphone (DILAUDID) injection 2 mg (2 mg Intravenous  Given 10/06/12 1353)  spironolactone (ALDACTONE) tablet 200 mg (not administered)  furosemide (LASIX) injection 80 mg (not administered)  ondansetron (ZOFRAN) injection 4 mg (not administered)  HYDROmorphone (DILAUDID) injection 1 mg (not administered)  ondansetron (ZOFRAN) injection 4 mg (4 mg Intravenous Given 10/06/12 1244)    Note: Portions of this report may have been transcribed using voice recognition software. Every effort was made to ensure accuracy; however, inadvertent computerized transcription errors may be present    Wynetta Emery, PA-C 10/06/12 1455

## 2012-10-07 ENCOUNTER — Other Ambulatory Visit: Payer: Self-pay | Admitting: Nurse Practitioner

## 2012-10-07 DIAGNOSIS — E876 Hypokalemia: Secondary | ICD-10-CM | POA: Diagnosis present

## 2012-10-07 DIAGNOSIS — E119 Type 2 diabetes mellitus without complications: Secondary | ICD-10-CM | POA: Diagnosis not present

## 2012-10-07 DIAGNOSIS — D638 Anemia in other chronic diseases classified elsewhere: Secondary | ICD-10-CM | POA: Diagnosis not present

## 2012-10-07 DIAGNOSIS — E43 Unspecified severe protein-calorie malnutrition: Secondary | ICD-10-CM | POA: Diagnosis present

## 2012-10-07 DIAGNOSIS — R609 Edema, unspecified: Secondary | ICD-10-CM | POA: Diagnosis not present

## 2012-10-07 DIAGNOSIS — R188 Other ascites: Secondary | ICD-10-CM | POA: Diagnosis not present

## 2012-10-07 LAB — HEMOGLOBIN A1C
Hgb A1c MFr Bld: 5.3 % (ref <5.7)
Mean Plasma Glucose: 105 mg/dL (ref <117)

## 2012-10-07 LAB — COMPREHENSIVE METABOLIC PANEL
ALT: 32 U/L (ref 0–53)
AST: 61 U/L — ABNORMAL HIGH (ref 0–37)
Alkaline Phosphatase: 185 U/L — ABNORMAL HIGH (ref 39–117)
Calcium: 8 mg/dL — ABNORMAL LOW (ref 8.4–10.5)
Glucose, Bld: 167 mg/dL — ABNORMAL HIGH (ref 70–99)
Potassium: 2.9 mEq/L — ABNORMAL LOW (ref 3.5–5.1)
Sodium: 135 mEq/L (ref 135–145)
Total Protein: 5.1 g/dL — ABNORMAL LOW (ref 6.0–8.3)

## 2012-10-07 LAB — AFP TUMOR MARKER: AFP-Tumor Marker: 130.1 ng/mL — ABNORMAL HIGH (ref 0.0–8.0)

## 2012-10-07 LAB — GLUCOSE, CAPILLARY
Glucose-Capillary: 129 mg/dL — ABNORMAL HIGH (ref 70–99)
Glucose-Capillary: 130 mg/dL — ABNORMAL HIGH (ref 70–99)
Glucose-Capillary: 168 mg/dL — ABNORMAL HIGH (ref 70–99)

## 2012-10-07 MED ORDER — POTASSIUM CHLORIDE CRYS ER 20 MEQ PO TBCR
20.0000 meq | EXTENDED_RELEASE_TABLET | Freq: Two times a day (BID) | ORAL | Status: DC
  Administered 2012-10-07 – 2012-10-08 (×4): 20 meq via ORAL
  Filled 2012-10-07 (×6): qty 1

## 2012-10-07 MED ORDER — POTASSIUM CHLORIDE CRYS ER 20 MEQ PO TBCR
40.0000 meq | EXTENDED_RELEASE_TABLET | Freq: Once | ORAL | Status: AC
  Administered 2012-10-07: 40 meq via ORAL
  Filled 2012-10-07: qty 2

## 2012-10-07 NOTE — Progress Notes (Addendum)
[facility] PROGRESS NOTE  Tony Boyd ZOX:[id number] DOB: 03-Mar-1955 DOA: 10/06/2012 PCP: [name], DO  Brief narrative: 57 year old male with past medical history of liver cirrhosis (MELD 31) related to hepatitis C and alcohol abuse, diabetes, esophageal varices, bipolar disorder, recently hospitalized in 08/2012 for acute encephalopathy who presented [facility] ED 10/06/2012 with worsening LE swelling  And scrotal edema progressively worsening over past few days prior to this admission.  In ED, BP was 103/52, HR 86 and T 97.6 F. BMP revealed  Potassium level of 3.2. INR was 1.85 and CBC revealed hemoglobin of 10.4 and platelet count of 46. Abdominal US did not demonstrate ascites. Scrotal US demonstrated marked soft tissue swelling in scrotal area likely third spacing from liver cirrhosis.   Assessment/Plan:  Principal Problem:   Acute hepatic encephalopathy - mental status improving - continue lactulose 30 g PO x 5 daily - continue rifaximin 500 mg PO BID - PT eval prior to discharge  Active Problems:   Anasarca, Scrotal swelling - secondary to history of liver cirrhossis - continue lasix 80 mg IV Q 6 hours   Coagulopathy - secondary to liver cirrhosis   Hyponatremia - secondary to volume overload due to liver cirrhosis - low salt diet  Hypokalemia - secondary to GI losses, lasix - continue to supplement    Liver Cirrhosis - continue lasix, spironolactone, rifaximin and lactulose - continue protonix 40 mg daily   Seizure disorder - continue Keppra 500 mg PO BID   Thrombocytopenia, acquired - likely related to bone marrow damage form liver cirrhosis - platelet count 46 - continue to monitor   Hypothyroidism - continue synthroid   Diabetes mellitus - continue novolog 6 units TID and lantus 10 units at bedtime - continue sliding scale insulin    Anemia of chronic disease - related to history of liver cirrhosis - hemoglobin 9.9 - no signs of acute bleed - no  indications for transfusion   Moderate protein-calorie malnutrition - nutrition consulted  Code Status: full code Family Communication: no family at the bedside Disposition Plan: to SNF when stable  [name], MD  [facility] Pager [phone]  If 7PM-7AM, please contact night-coverage [email or web address] Password [facility] 10/07/2012, 6:36 AM   LOS: 1 day   Consultants:  None   Procedures:  None   Antibiotics:  None   HPI/Subjective: No overnight events.   Objective: Filed Vitals:   10/06/12 1545 10/06/12 2030 10/07/12 0015 10/07/12 0426  BP: 148/73 121/59 103/52 112/54  Pulse: 90 88  95  Temp: 98.7 F (37.1 C) 98.9 F (37.2 C)  97.9 F (36.6 C)  TempSrc: Oral Oral  Oral  Resp: 18   16  Height: 5\' 8"  (1.727 m)     Weight: 144.1 kg (317 lb 10.9 oz)     SpO2: 100% 99%  97%    Intake/Output Summary (Last 24 hours) at 10/07/12 0636 Last data filed at 10/07/12 8295  Gross per 24 hour  Intake    360 ml  Output   3025 ml  Net  -2665 ml    Exam:   General:  Pt is alert, not in acute distress  Cardiovascular: Regular rate and rhythm, S1/S2 appreciated  Respiratory: Clear to auscultation bilaterally, no wheezing, no crackles, no rhonchi  Abdomen: distended, firm to palpation, bowel sounds present, no guarding  Extremities: LE edema, pulses DP and PT palpable bilaterally; chronic skin changes  Neuro: Grossly nonfocal  Data Reviewed: Basic Metabolic Panel:  Recent Labs Lab 10/02/12  1355 10/06/12 1240 10/06/12 1253 10/06/12 1630 10/07/12 0355  NA 136 134* 138  --  135  K 3.3* 3.2* 3.0*  --  2.9*  CL 99 99 96  --  99  CO2 23 32  --   --  34*  GLUCOSE 86 133* 128*  --  167*  BUN 9 8 6   --  8  CREATININE 0.60* 0.65 0.80  --  0.72  CALCIUM 7.5* 8.0*  --   --  8.0*  MG  --   --   --  1.5  --    Liver Function Tests:  Recent Labs Lab 10/02/12 1355 10/06/12 1240 10/07/12 0355  AST 69* 72* 61*  ALT 37 35 32  ALKPHOS 219* 185* 185*  BILITOT 3.0* 3.8* 3.0*   PROT 5.9* 5.5* 5.1*  ALBUMIN  --  1.9* 1.8*    Recent Labs Lab 10/06/12 1240  LIPASE 33    Recent Labs Lab 10/06/12 1240  AMMONIA 80*   CBC:  Recent Labs Lab 10/06/12 1240 10/06/12 1253  WBC 4.5  --   NEUTROABS 3.4  --   HGB 10.4* 9.9*  HCT 30.2* 29.0*  MCV 104.5*  --   PLT 46*  --    Cardiac Enzymes: No results found for this basename: CKTOTAL, CKMB, CKMBINDEX, TROPONINI,  in the last 168 hours BNP: No components found with this basename: POCBNP,  CBG:  Recent Labs Lab 09/30/12 0640 10/06/12 1632 10/06/12 2117  GLUCAP 121* 135* 176*    No results found for this or any previous visit (from the past 240 hour(s)).   Studies: US Scrotum 10/06/2012   * IMPRESSION: 1.  Marked soft tissue swelling/edema of the scrotum.  This is nonspecific and may be secondary to third spacing of fluid from history of cirrhosis or infection.  2. Note arterial waveforms are not able to be demonstrated in either testicle likely secondary to marked swelling of the scrotum. Color doppler flow is present.  Discussed with PA Pisciotta at 01:45 p.m. on 08/02 01/15/2013.   Original Report Authenticated By: Annia Belt, M.D   US Abdomen Limited 10/06/2012   *RADIOLOGY REPORT*  Limited abdominal ultrasound  History:  Abdominal distension  Findings:  No demonstrable ascites.  There is peristalsing gas throughout the abdomen in all quadrants.  Conclusion:  No demonstrable ascites.     [place] Art/ven Flow Abd Pelv Doppler 10/06/2012   * IMPRESSION: 1.  Marked soft tissue swelling/edema of the scrotum.  This is nonspecific and may be secondary to third spacing of fluid from history of cirrhosis or infection.  2. Note arterial waveforms are not able to be demonstrated in either testicle likely secondary to marked swelling of the scrotum. Color doppler flow is present.     Scheduled Meds: . ferrous sulfate  325 mg Oral QHS  . folic acid  1 mg Oral Daily  . furosemide  80 mg Intravenous Q6H  . insulin  aspart  0-15 Units Subcutaneous TID WC  . insulin aspart  0-5 Units Subcutaneous QHS  . insulin aspart  6 Units Subcutaneous TID WC  . insulin glargine  10 Units Subcutaneous QHS  . lactulose  30 g Oral 5 X Daily  . levETIRAcetam  500 mg Oral BID  . levothyroxine  75 mcg Oral QAC breakfast  . oxyCODONE  10 mg Oral Q4H  . pantoprazole  40 mg Oral Daily  . potassium chloride  10 mEq Oral BID  . rifaximin  550 mg  Oral BID  . risperiDONE  0.25 mg Oral BID  . spironolactone  100 mg Oral Daily

## 2012-10-07 NOTE — Progress Notes (Signed)
Clinical Social Work Department BRIEF PSYCHOSOCIAL ASSESSMENT 10/07/2012  Patient:  Tony Boyd, Tony Boyd     Account Number:  [id number]     Admit date:  10/06/2012  Clinical Social Worker:  Jacelyn Grip  Date/Time:  10/07/2012 02:00 PM  Referred by:  Physician  Date Referred:  10/07/2012 Referred for  Other - See comment   Other Referral:   Admitted from [facility]   Interview type:  Family Other interview type:    PSYCHOSOCIAL DATA Living Status:  ALONE Admitted from facility:   Level of care:   Primary support name:  [name] Charles/[phone] Primary support relationship to patient:  CHILD, ADULT Degree of support available:   strong    CURRENT CONCERNS Current Concerns  Post-Acute Placement   Other Concerns:    SOCIAL WORK ASSESSMENT / PLAN CSW received referral that patient admitted from facility, but pt chart reflects that pt admitted from [facility], but recently had a stay at [facility].    CSW visited pt room, but pt sleeping heavily and RN reports that pt has been confused today.    CSW contacted pt daughter, Tamera Punt via telephone. Pt daughter confirmed that pt is currently residing at [facility]. Pt daughter discussed that she is the primary caregiver for pt, but does not live with pt. Pt daughter stated that she and other family members check on patient throughout the day, but the patient is alone during the night. Pt daughter discussed that pt had a home health nurse come to evaluate pt following being discharged from [facility], but was unable to recall name of the agency. Pt daughter is interested in hiring private duty care for pt at night and agreeable to CSW leaving private duty care list in pt room.    Pt daughter discussed that pt was at [facility] and probably left the facility too soon, but pt was demanding to return home. Pt daughter expressed concern that the facility had  taken pt off of his anti anxiety medications and pt daughter feels that this contributed to pt demands of wanting to return home. Pt daughter did not feel that they were able to get the things in place at home before pt returned home from facility and pt daughter wants to ensure that things are in place at home before d/c from hospital if pt is appropriate to return home.    Pt daughter recognizes that it may be recommended that pt have more rehab prior to return home and though pt daughter feels that pt will want to go home; she also wants to do what his best for her father. CSW discussed that PT has been asked to evaluate patient to determine the appropriate discharge plan.    CSW provided Private Duty Care list in pt room.    CSW to follow up after PT evaluation to assist with discharge planning needs as appropriate.    CSW continuing to follow.   Assessment/plan status:  Psychosocial Support/Ongoing Assessment of Needs Other assessment/ plan:   discharge planning   Information/referral to community resources:   Private Duty Care List; discussed with RNCM    PATIENT'S/FAMILY'S RESPONSE TO PLAN OF CARE: Pt sleeping at this time and per RN, has been confused today. Pt daughter is supportive and actively involved in pt care. Pt daughter expressed wanting to ensure that discharge planning decisions were made in the best interest of her father. Pt daughter appreciative of CSW phone call and asisstance.  [name], MSW, LCSWA  Clinical Social Work [phone]

## 2012-10-07 NOTE — Progress Notes (Signed)
PT Cancellation Note  Patient Details Name: Tony Boyd MRN: [id number] DOB: 1955/04/11   Cancelled Treatment:    Reason Eval/Treat Not Completed: Fatigue/lethargy limiting ability to participate (RN requests we do PT eval in am)   Donnetta Hail 10/07/2012, 5:20 PM

## 2012-10-07 NOTE — Care Management Note (Signed)
   CARE MANAGEMENT NOTE 10/07/2012  Patient:  Tony Boyd, Tony Boyd   Account Number:  [id number]  Date Initiated:  10/07/2012  Documentation initiated by:  Allora Bains  Subjective/Objective Assessment:   57 yo male admitted with anasarca & hepatic encephalopathy.     Action/Plan:   ALF   Anticipated DC Date:     Anticipated DC Plan:  ASSISTED LIVING / REST HOME  In-house referral  Clinical Social Worker      DC Planning Services  CM consult      Choice offered to / List presented to:  NA   DME arranged  NA      DME agency  NA     HH arranged  NA      HH agency  NA   Status of service:  In process, will continue to follow Medicare Important Message given?   (If response is "NO", the following Medicare IM given date fields will be blank) Date Medicare IM given:   Date Additional Medicare IM given:    Discharge Disposition:    Per UR Regulation:  Reviewed for med. necessity/level of care/duration of stay  If discussed at Long Length of Stay Meetings, dates discussed:    Comments:  10/07/12 1139 Amirra Herling,MSN,RN [phone] Pt previosuly discharged from [facility] to [facility]. Will continue to follow for further discharge needs.

## 2012-10-08 ENCOUNTER — Institutional Professional Consult (permissible substitution): Payer: Self-pay | Admitting: Internal Medicine

## 2012-10-08 DIAGNOSIS — D638 Anemia in other chronic diseases classified elsewhere: Secondary | ICD-10-CM | POA: Diagnosis not present

## 2012-10-08 DIAGNOSIS — K7460 Unspecified cirrhosis of liver: Secondary | ICD-10-CM | POA: Diagnosis not present

## 2012-10-08 DIAGNOSIS — E8779 Other fluid overload: Secondary | ICD-10-CM | POA: Diagnosis not present

## 2012-10-08 DIAGNOSIS — R609 Edema, unspecified: Secondary | ICD-10-CM | POA: Diagnosis not present

## 2012-10-08 LAB — GLUCOSE, CAPILLARY
Glucose-Capillary: 162 mg/dL — ABNORMAL HIGH (ref 70–99)
Glucose-Capillary: 168 mg/dL — ABNORMAL HIGH (ref 70–99)

## 2012-10-08 LAB — BASIC METABOLIC PANEL
BUN: 7 mg/dL (ref 6–23)
Calcium: 7.7 mg/dL — ABNORMAL LOW (ref 8.4–10.5)
Chloride: 95 mEq/L — ABNORMAL LOW (ref 96–112)
Creatinine, Ser: 0.69 mg/dL (ref 0.50–1.35)
GFR calc Af Amer: >90 mL/min (ref >90)

## 2012-10-08 LAB — CBC
HCT: 28.6 % — ABNORMAL LOW (ref 39.0–52.0)
MCH: 36.4 pg — ABNORMAL HIGH (ref 26.0–34.0)
MCHC: 34.3 g/dL (ref 30.0–36.0)
MCV: 106.3 fL — ABNORMAL HIGH (ref 78.0–100.0)
Platelets: 35 10*3/uL — ABNORMAL LOW (ref 150–400)
RDW: 17 % — ABNORMAL HIGH (ref 11.5–15.5)
WBC: 3.4 10*3/uL — ABNORMAL LOW (ref 4.0–10.5)

## 2012-10-08 MED ORDER — FUROSEMIDE 80 MG PO TABS
80.0000 mg | ORAL_TABLET | Freq: Four times a day (QID) | ORAL | Status: DC
  Administered 2012-10-09 – 2012-10-10 (×7): 80 mg via ORAL
  Filled 2012-10-08 (×12): qty 1

## 2012-10-08 MED ORDER — FUROSEMIDE 80 MG PO TABS: 80.0000 mg | ORAL_TABLET | Freq: Four times a day (QID) | ORAL | Status: DC

## 2012-10-08 NOTE — Progress Notes (Signed)
CSW noted that PT recommended SNF and PMT GOC scheduled for tomorrow.  Per notes, pt continues to be disoriented.   CSW contacted pt daughter, Tony Boyd via telephone. CSW discussed that PT recommendation was for SNF. Pt daughter discussed that she wants to do what is best for her father, but she knows that her father will likely disagree with SNF placement. CSW discussed that per MD documents, pt continues to be disoriented and that initiation of SNF search would be decision of the daughter at this time and then once discharge plan is further clarified then discussion can be had with the patient and if he refuses SNF placement a capacity evaluation can be requested. Pt daughter feels that this would be the best approach.   CSW completed FL2 and initiated SNF search to [facility].  CSW to follow up with pt daughter tomorrow following PMT GOC meeting.  CSW to continue to follow.  [name], MSW, LCSWA  Clinical Social Work [phone]

## 2012-10-08 NOTE — Evaluation (Signed)
Physical Therapy Evaluation Patient Details Name: Tony Boyd MRN: [id number] DOB: 04-Apr-1955 Today's Date: 10/08/2012 Time: 4098-1191 PT Time Calculation (min): 25 min  PT Assessment / Plan / Recommendation History of Present Illness  57 y.o. male with h/o cirrhosis of liver, EtOH, hepatitis C admitted with anasarca, scrotal swelling, hepatic encephalopathy.   Clinical Impression  *Pt states he ambulated independently with 4 wheeled walker PTA. At present mobility is limited by scrotal and BLE swelling. He is able to transfer bed to recliner with RW and min assist. At present functional level SNF is recommended. Pt would benefit from acute PT to maximize safety and independence with mobility. **    PT Assessment  Patient needs continued PT services    Follow Up Recommendations  SNF    Does the patient have the potential to tolerate intense rehabilitation      Barriers to Discharge        Equipment Recommendations  None recommended by PT    Recommendations for Other Services     Frequency Min 3X/week    Precautions / Restrictions Precautions Precaution Comments: scrotal swelling Restrictions Weight Bearing Restrictions: No   Pertinent Vitals/Pain *8/10 back Premedicated, repositioned**      Mobility  Bed Mobility Bed Mobility: Supine to Sit;Sitting - Scoot to Edge of Bed Supine to Sit: 6: Modified independent (Device/Increase time);HOB elevated Sitting - Scoot to Edge of Bed: 7: Independent Details for Bed Mobility Assistance: increased time 2* painful swollen scrotum. Weeping noted from R shin open wound and large amount on bedsheet under scrotum, RN notified.  Transfers Sit to Stand: 4: Min guard;From bed Stand to Sit: 4: Min guard;To chair/3-in-1;With upper extremity assist;With armrests Ambulation/Gait Ambulation/Gait Assistance: 4: Min guard Ambulation Distance (Feet): 3 Feet Assistive device: Rolling walker Gait Pattern: Wide base of support;Step-to  pattern;Decreased step length - right;Decreased step length - left General Gait Details: wide BOS 2* painful swollen scrotum, distance limited by pain    Exercises     PT Diagnosis: Difficulty walking;Acute pain  PT Problem List: Decreased activity tolerance;Decreased mobility;Pain;Obesity PT Treatment Interventions: Gait training;Functional mobility training;Therapeutic activities;Therapeutic exercise;Patient/family education     PT Goals(Current goals can be found in the care plan section) Acute Rehab PT Goals Patient Stated Goal: to go home PT Goal Formulation: With patient Time For Goal Achievement: 10/22/12 Potential to Achieve Goals: Fair  Visit Information  Last PT Received On: 10/08/12 History of Present Illness: 57 y.o. male with h/o cirrhosis of liver, EtOH, hepatitis C admitted with anasarca, scrotal swelling, hepatic encephalopathy.        Prior Functioning  Home Living Family/patient expects to be discharged to:: Skilled nursing facility (pt wants to DC to ILF, PT recommending SNF) Living Arrangements: Alone Type of Home: Independent living facility Home Access: Level entry Home Layout: One level Home Equipment: Walker - 4 wheels;Tub bench Additional Comments: pt states family can come assist him 5x/day at independent living facility Prior Function Level of Independence: Independent with assistive device(s) Comments: 4 wheeled RW Communication Communication: No difficulties Dominant Hand: Right    Cognition  Cognition Arousal/Alertness: Awake/alert Behavior During Therapy: WFL for tasks assessed/performed Overall Cognitive Status: Within Functional Limits for tasks assessed    Extremity/Trunk Assessment Upper Extremity Assessment Upper Extremity Assessment: Overall WFL for tasks assessed Lower Extremity Assessment Lower Extremity Assessment: Difficult to assess due to impaired cognition (significant swelling BLEs, able to actively move all  joints) Cervical / Trunk Assessment Cervical / Trunk Assessment: Normal   Balance Balance Balance  Assessed: Yes Static Sitting Balance Static Sitting - Balance Support: Bilateral upper extremity supported;Feet supported Static Sitting - Level of Assistance: 7: Independent Static Sitting - Comment/# of Minutes: 2  End of Session PT - End of Session Activity Tolerance: Patient limited by pain Patient left: in bed;with call bell/phone within reach Nurse Communication: Mobility status  GP     Tony Boyd 10/08/2012, 12:10 PM [phone]

## 2012-10-08 NOTE — Progress Notes (Signed)
[facility] PROGRESS NOTE  Tony Boyd YNW:[id number] DOB: Sep 02, 1955 DOA: 10/06/2012 PCP: [name], DO  Brief narrative: 57 year old male with past medical history of liver cirrhosis (MELD 31) related to hepatitis C and alcohol abuse, diabetes, esophageal varices, bipolar disorder, recently hospitalized in 08/2012 for acute encephalopathy who presented to [facility] ED 10/06/2012 with worsening LE swelling And scrotal edema progressively worsening over past few days prior to this admission.  In ED, BP was 103/52, HR 86 and T 97.6 F. BMP revealed Potassium level of 3.2. INR was 1.85 and CBC revealed hemoglobin of 10.4 and platelet count of 46. Abdominal US did not demonstrate ascites. Scrotal US demonstrated marked soft tissue swelling in scrotal area likely third spacing from liver cirrhosis.  Hospital course is complicated due to multiple blood work abnormalities, anasarca and ongoing mental status changes. Order was placed today for goals of care.   Assessment/Plan:   Principal Problem:  Acute hepatic encephalopathy  - still disoriented, does not know what time of the day is it, confused where he is. Knows his name.  - continue lactulose 30 g PO x 5 daily, titrate to 3 BM a day  - continue rifaximin 500 mg PO BID  - PT eval prior to discharge - pending as now due to inability to participate (pt too weak) Active Problems:  Anasarca, Scrotal swelling  - secondary to history of liver cirrhossis  - continue lasix 80 mg IV Q 6 hours and spironolactone 100 mg daily Coagulopathy  - secondary to liver cirrhosis  Hyponatremia  - secondary to volume overload, lasix,  liver cirrhosis  - continue to monitor BMP - low salt diet  Hypokalemia  - secondary to GI losses, lasix  - continue to supplement; on potassium 20 meq BID Liver Cirrhosis  - continue lasix, spironolactone, rifaximin and lactulose  - continue protonix 40 mg daily  Seizure disorder  - continue Keppra 500 mg PO BID   Thrombocytopenia, acquired  - likely related to bone marrow damage form liver cirrhosis  - platelet count 46 --> 35 - continue to monitor  - no signs of acute bleed Hypothyroidism  - continue synthroid  Diabetes mellitus  - continue novolog 6 units TID and lantus 10 units at bedtime  - continue sliding scale insulin  Metabolic alkalosis - due to lasix Anemia of chronic disease  - related to history of liver cirrhosis  - hemoglobin 9.9 --> 9.8 - no signs of acute bleed  - no indications for transfusion  Moderate protein-calorie malnutrition  - nutrition consulted   Code Status: full code  Family Communication: no family at the bedside  Disposition Plan: to SNF when stable   [name], MD  Medical [facility]  Pager [phone]   Consultants:  Palliative care 10/09/2102 Procedures:  None  Antibiotics:  None   If 7PM-7AM, please contact night-coverage [email or web address] Password [facility] 10/08/2012, 2:58 PM   LOS: 2 days   HPI/Subjective: No acute overnight events. Pleasantly confused.   Objective: Filed Vitals:   10/07/12 1400 10/07/12 2132 10/08/12 0553 10/08/12 1400  BP: 120/54 114/52 106/48 128/64  Pulse: 92 78 79 84  Temp: 99 F (37.2 C) 99.7 F (37.6 C) 98.8 F (37.1 C) 98.3 F (36.8 C)  TempSrc: Oral Oral Oral Oral  Resp: 16 12 16 18   Height:      Weight:   147.5 kg (325 lb 2.9 oz)   SpO2: 96% 94% 100% 100%    Intake/Output Summary (Last 24 hours) at 10/08/12  1458 Last data filed at 10/08/12 1400  Gross per 24 hour  Intake    960 ml  Output   3625 ml  Net  -2665 ml    Exam:   General:  Pt is alert, follows commands appropriately, not in acute distress  Cardiovascular: Regular rate and rhythm, S1/S2, no murmurs, no rubs, no gallops  Respiratory: Clear to auscultation bilaterally, no wheezing, no crackles, no rhonchi  Abdomen: Soft, non tender, non distended, bowel sounds present, no guarding  Extremities: No edema, pulses DP and PT palpable  bilaterally  Neuro: Grossly nonfocal  Data Reviewed: Basic Metabolic Panel:  Recent Labs Lab 10/02/12 1355 10/06/12 1240 10/06/12 1253 10/06/12 1630 10/07/12 0355 10/08/12 0655  NA 136 134* 138  --  135 134*  K 3.3* 3.2* 3.0*  --  2.9* 3.1*  CL 99 99 96  --  99 95*  CO2 23 32  --   --  34* 35*  GLUCOSE 86 133* 128*  --  167* 164*  BUN 9 8 6   --  8 7  CREATININE 0.60* 0.65 0.80  --  0.72 0.69  CALCIUM 7.5* 8.0*  --   --  8.0* 7.7*  MG  --   --   --  1.5  --   --    Liver Function Tests:  Recent Labs Lab 10/02/12 1355 10/06/12 1240 10/07/12 0355  AST 69* 72* 61*  ALT 37 35 32  ALKPHOS 219* 185* 185*  BILITOT 3.0* 3.8* 3.0*  PROT 5.9* 5.5* 5.1*  ALBUMIN  --  1.9* 1.8*    Recent Labs Lab 10/06/12 1240  LIPASE 33    Recent Labs Lab 10/06/12 1240  AMMONIA 80*   CBC:  Recent Labs Lab 10/06/12 1240 10/06/12 1253 10/08/12 0655  WBC 4.5  --  3.4*  NEUTROABS 3.4  --   --   HGB 10.4* 9.9* 9.8*  HCT 30.2* 29.0* 28.6*  MCV 104.5*  --  106.3*  PLT 46*  --  35*   Cardiac Enzymes: No results found for this basename: CKTOTAL, CKMB, CKMBINDEX, TROPONINI,  in the last 168 hours BNP: No components found with this basename: POCBNP,  CBG:  Recent Labs Lab 10/07/12 1146 10/07/12 1702 10/07/12 2114 10/08/12 0748 10/08/12 1151  GLUCAP 130* 118* 168* 157* 162*    No results found for this or any previous visit (from the past 240 hour(s)).   Studies: US Abdomen Limited  10/06/2012   *RADIOLOGY REPORT*  Limited abdominal ultrasound  History:  Abdominal distension  Findings:  No demonstrable ascites.  There is peristalsing gas throughout the abdomen in all quadrants.  Conclusion:  No demonstrable ascites.   Original Report Authenticated By: Bretta Bang, M.D.    Scheduled Meds: . ferrous sulfate  325 mg Oral QHS  . folic acid  1 mg Oral Daily  . furosemide  80 mg Intravenous Q6H  . insulin aspart  0-15 Units Subcutaneous TID WC  . insulin aspart  0-5  Units Subcutaneous QHS  . insulin aspart  6 Units Subcutaneous TID WC  . insulin glargine  10 Units Subcutaneous QHS  . lactulose  30 g Oral 5 X Daily  . levETIRAcetam  500 mg Oral BID  . levothyroxine  75 mcg Oral QAC breakfast  . oxyCODONE  10 mg Oral Q4H  . pantoprazole  40 mg Oral Daily  . potassium chloride  20 mEq Oral BID  . rifaximin  550 mg Oral BID  . risperiDONE  0.25 mg Oral BID  . sodium chloride  3 mL Intravenous Q12H  . spironolactone  100 mg Oral Daily   Continuous Infusions:

## 2012-10-08 NOTE — Progress Notes (Signed)
Thank you for consulting the Palliative Medicine Team at [facility] to meet your patient's and family's needs.  The reason that you asked [place] to see your patient is for a GOC - Goals of Care We have scheduled your patient for a meeting tomorrow 8/14   @   11:30am The Surrogate decision maker is Dtr Tamera Punt [phone] Your patient is unable to participate. Additional Narrative:  Cirrhosis related to Hep C, ETOC abuse, esophageal varices, bipolar, BLE & scrotal edema  Chalmers Cater RN,  PMT RN Liaison Call PMT phone [phone]

## 2012-10-09 DIAGNOSIS — R531 Weakness: Secondary | ICD-10-CM

## 2012-10-09 DIAGNOSIS — Z515 Encounter for palliative care: Secondary | ICD-10-CM

## 2012-10-09 DIAGNOSIS — R5381 Other malaise: Secondary | ICD-10-CM

## 2012-10-09 LAB — GLUCOSE, CAPILLARY: Glucose-Capillary: 202 mg/dL — ABNORMAL HIGH (ref 70–99)

## 2012-10-09 MED ORDER — BISACODYL 10 MG RE SUPP: 10.0000 mg | Freq: Once | RECTAL | Status: DC

## 2012-10-09 MED ORDER — POTASSIUM CHLORIDE CRYS ER 20 MEQ PO TBCR
20.0000 meq | EXTENDED_RELEASE_TABLET | Freq: Three times a day (TID) | ORAL | Status: DC
  Administered 2012-10-09 – 2012-10-10 (×4): 20 meq via ORAL
  Filled 2012-10-09 (×6): qty 1

## 2012-10-09 MED ORDER — BISACODYL 10 MG RE SUPP: 10.0000 mg | Freq: Every day | RECTAL | Status: DC | PRN

## 2012-10-09 NOTE — Progress Notes (Signed)
[facility] PROGRESS NOTE  Tony Boyd JWJ:[id number] DOB: [birth year] DOA: 10/06/2012 PCP: [name], DO  Brief narrative: Tony Boyd is an 57 y.o. male with a PMH of liver cirrhosis (MELD 31) related to hepatitis C and alcohol abuse, diabetes, esophageal varices, bipolar disorder, recently hospitalized in 08/2012 for acute encephalopathy who presented to [facility] ED 10/06/2012 with worsening LE swelling And scrotal edema.  Scrotal US demonstrated marked soft tissue swelling in scrotal area likely third spacing from liver cirrhosis. Hospital course is complicated due to multiple blood work abnormalities, anasarca and ongoing mental status changes. Palliative care has been consulted for a family meeting to discuss goals of care.   Assessment/Plan: Principal Problem:  Acute hepatic encephalopathy  - Continue lactulose 30 g PO x 5 daily, titrate to 3 BM a day. - Continue rifaximin 500 mg PO BID.  - PT evaluation performed 10/08/2012.  SNF recommended. Active Problems:  Anasarca, Scrotal swelling  - Secondary to history of liver cirrhosis. - Continue lasix 80 mg IV Q 6 hours and spironolactone 100 mg daily.  - Still with massive volume overload. - Will have ortho tech place unna boots. Coagulopathy  - Secondary to liver cirrhosis.  Hyponatremia  - Secondary to volume overload, cirrhosis physiology.   Hypokalemia  - Secondary to GI losses, lasix.  - Continue to supplement; on potassium 20 meq BID, increase to TID.  Liver Cirrhosis  - Continue lasix, spironolactone, rifaximin and lactulose.  Seizure disorder  - Continue Keppra 500 mg PO BID. Thrombocytopenia, acquired  - Secondary to liver disease. Hypothyroidism  - Continue synthroid.  Diabetes mellitus  - Continue novolog 6 units TID, moderate scale SS I and lantus 10 units at bedtime. CBGs 149-168 Metabolic alkalosis  - Likely contraction alkalosis secondary to diuretics.  Anemia of chronic disease  - Hemoglobin stable, no  indications for transfusion. Moderate protein-calorie malnutrition  - Nutrition consulted.   Code Status: Full. Family Communication: No family at bedside. Disposition Plan: Likely SNF   Medical Consultants:  Palliative care  Other Consultants:  Physical therapy: Skilled nursing home placement recommended.  Anti-infectives:  Rifaximin 10/06/2012--->   HPI/Subjective: Tony Boyd is sitting up in a chair. He tells me he can ambulate short distances but prefers not to get up "if I don't have to ". Denies nausea, vomiting, diarrhea. Bowels have not moved in the last few days. Reports occasional abdominal pain and pain in the scrotal area.  Objective: Filed Vitals:   10/08/12 0553 10/08/12 1400 10/08/12 2050 10/09/12 0649  BP: 106/48 128/64 119/69 101/71  Pulse: 79 84 87 95  Temp: 98.8 F (37.1 C) 98.3 F (36.8 C) 98 F (36.7 C) 98.6 F (37 C)  TempSrc: Oral Oral Oral Oral  Resp: 16 18 16 16   Height:      Weight: 147.5 kg (325 lb 2.9 oz)     SpO2: 100% 100% 100% 100%    Intake/Output Summary (Last 24 hours) at 10/09/12 0759 Last data filed at 10/08/12 2057  Gross per 24 hour  Intake    960 ml  Output   2400 ml  Net  -1440 ml    Exam: Gen:  NAD Cardiovascular:  RRR, No M/R/G Respiratory:  Lungs CTAB Gastrointestinal:  Abdomen soft, distended, + BS Extremities:  3+ edema to upper and lower extremities  Data Reviewed: Basic Metabolic Panel:  Recent Labs Lab 10/02/12 1355 10/06/12 1240 10/06/12 1253 10/06/12 1630 10/07/12 0355 10/08/12 0655  NA 136 134* 138  --  135 134*  K 3.3* 3.2* 3.0*  --  2.9* 3.1*  CL 99 99 96  --  99 95*  CO2 23 32  --   --  34* 35*  GLUCOSE 86 133* 128*  --  167* 164*  BUN 9 8 6   --  8 7  CREATININE 0.60* 0.65 0.80  --  0.72 0.69  CALCIUM 7.5* 8.0*  --   --  8.0* 7.7*  MG  --   --   --  1.5  --   --    GFR Estimated Creatinine Clearance: 144.1 ml/min (by C-G formula based on Cr of 0.69). Liver Function Tests:  Recent  Labs Lab 10/02/12 1355 10/06/12 1240 10/07/12 0355  AST 69* 72* 61*  ALT 37 35 32  ALKPHOS 219* 185* 185*  BILITOT 3.0* 3.8* 3.0*  PROT 5.9* 5.5* 5.1*  ALBUMIN  --  1.9* 1.8*    Recent Labs Lab 10/06/12 1240  LIPASE 33    Recent Labs Lab 10/06/12 1240  AMMONIA 80*   Coagulation profile  Recent Labs Lab 10/06/12 1240 10/07/12 0355  INR 1.85* 1.85*    CBC:  Recent Labs Lab 10/06/12 1240 10/06/12 1253 10/08/12 0655  WBC 4.5  --  3.4*  NEUTROABS 3.4  --   --   HGB 10.4* 9.9* 9.8*  HCT 30.2* 29.0* 28.6*  MCV 104.5*  --  106.3*  PLT 46*  --  35*   CBG:  Recent Labs Lab 10/07/12 2114 10/08/12 0748 10/08/12 1151 10/08/12 1651 10/08/12 2035  GLUCAP 168* 157* 162* 149* 168*   Hgb A1c  Recent Labs  10/06/12 1630  HGBA1C 5.3   Microbiology No results found for this or any previous visit (from the past 240 hour(s)).   Procedures and Diagnostic Studies: US Scrotum  10/06/2012   *RADIOLOGY REPORT*  Clinical Data:  Scrotal pain and swelling  SCROTAL ULTRASOUND DOPPLER ULTRASOUND OF THE TESTICLES  Technique: Complete ultrasound examination of the testicles, epididymis, and other scrotal structures was performed.  Color and spectral Doppler ultrasound were also utilized to evaluate blood flow to the testicles.  Comparison:  None  Findings:  Right testis:  The right testicle measures 2.7 x 2.1 x 1.7 cm. Color Doppler flow is demonstrated.  Venous waveforms are present. Arterial wave forms are not demonstrated, potentially secondary to difficulty visualizing the testicle from extensive scrotal swelling.  Left testis:  The left testicle measures 4.0 x 3.2 x 2.8 cm.  Color Doppler flow is present.  Venous waveforms are present.  Arterial wave forms are not demonstrated, potentially secondary to difficulty visualizing the testicle from extensive scrotal swelling  Right epididymis:  Normal in size in appearance measuring 1.4 x 1.0 x 1.3 cm  Left epididymis:  Normal in  size and appearance measuring 1.3 x 0.8 x 1.2 cm.  Hydrocele:  Small left hydrocele.  Varicocele:  Absent  Pulsed Doppler interrogation of both testes demonstrates normal venous waveforms.  Arterial wave forms are not able to be demonstrated, potentially secondary to extensive scrotal soft tissue swelling.  IMPRESSION: 1.  Marked soft tissue swelling/edema of the scrotum.  This is nonspecific and may be secondary to third spacing of fluid from history of cirrhosis or infection.  2. Note arterial waveforms are not able to be demonstrated in either testicle likely secondary to marked swelling of the scrotum. Color doppler flow is present.  Discussed with PA Pisciotta at 01:45 p.m. on 08/02 01/15/2013.   Original Report Authenticated By: Annia Belt, M.D  US Abdomen Limited  10/06/2012   *RADIOLOGY REPORT*  Limited abdominal ultrasound  History:  Abdominal distension  Findings:  No demonstrable ascites.  There is peristalsing gas throughout the abdomen in all quadrants.  Conclusion:  No demonstrable ascites.   Original Report Authenticated By: Bretta Bang, M.D.    Scheduled Meds: . ferrous sulfate  325 mg Oral QHS  . folic acid  1 mg Oral Daily  . furosemide  80 mg Oral Q6H  . insulin aspart  0-15 Units Subcutaneous TID WC  . insulin aspart  0-5 Units Subcutaneous QHS  . insulin aspart  6 Units Subcutaneous TID WC  . insulin glargine  10 Units Subcutaneous QHS  . lactulose  30 g Oral 5 X Daily  . levETIRAcetam  500 mg Oral BID  . levothyroxine  75 mcg Oral QAC breakfast  . oxyCODONE  10 mg Oral Q4H  . pantoprazole  40 mg Oral Daily  . potassium chloride  20 mEq Oral BID  . rifaximin  550 mg Oral BID  . risperiDONE  0.25 mg Oral BID  . spironolactone  100 mg Oral Daily   Continuous Infusions:   Time spent: 25 minutes.   LOS: 3 days   Tony Boyd  [facility] Pager [phone].   *Please note that the hospitalists switch teams on Wednesdays. Please call the flow manager at  [phone] if you are having difficulty reaching the hospitalist taking care of this patient as she can update you and provide the most up-to-date pager number of provider caring for the patient. If 8PM-8AM, please contact night-coverage at [email or web address], password [facility]  10/09/2012, 7:59 AM

## 2012-10-09 NOTE — Consult Note (Signed)
WOC consult Note Reason for Consult: Consult requested for scrotum.  Pt has generalized scrotal edema and large amt weeping to posterior areas.  Patchy areas of partial thickness skin loss, red and moist.  Painful to touch.  Appearance consistent with moisture-associated skin damage.  Discussed etiology and treatment with patient, he appears to have very limited understanding of this problem.  Difficult to promote healing if wound continues to leak.  Dressing will not adhere to this site.   Dressing procedure/placement/frequency: Barrier cream to repel moisture, decrease discomfort, and promote healing. Please re-consult if further assistance is needed.  Thank-you,  Cammie Mcgee MSN, RN, CWOCN, [place], CNS [phone]

## 2012-10-09 NOTE — Progress Notes (Signed)
CSW received notification from Lorinda Creed, PMT NP that PMT GOC completed and pt is agreeable to [facility] SNF for rehab before returning home.  CSW met with pt at bedside.  Pt confirmed that he is agreeable to SNF placement for rehab. Pt expressed that he does not want to stay at rehab for a long period of time and CSW discussed that pt would have to discuss this with the facility that he goes to.   CSW provided SNF bed offers to pt and pt would like to go to [facility]. Pt agreeable to [facility] clinical liaison visiting to discuss his stay. CSW contacted [facility] Clinical liaison, Reyne Dumas who will visit the patient tomorrow morning.  Pt agreeable to contacting pt daughter, Tamera Punt and CSW contacted pt daughter and left voice message.  Per MD, pt medically stable for discharge tomorrow. Pt aware.  CSW to facilitate pt discharge needs when pt medically stable for discharge, likely tomorrow.   [name], MSW, LCSWA  Clinical Social Work [phone]

## 2012-10-09 NOTE — Consult Note (Signed)
Patient [name]      DOB: Apr 25, 1955      WUJ:[id number]     Consult Note from the Palliative Medicine Team at [facility]    Consult Requested by: Dr Elisabeth Pigeon     PCP: [name], DO Reason for Consultation:Clatrification of GCO and options     Phone Number:[phone]  Assessment of patients Current state: Naszir Cott is an 57 y.o. male with a PMH of liver cirrhosis (MELD 31) related to hepatitis C and alcohol abuse, diabetes, esophageal varices, bipolar disorder, recently hospitalized in 08/2012 for acute encephalopathy who presented to [facility] ED 10/06/2012 with worsening LE swelling And scrotal edema. Scrotal US demonstrated marked soft tissue swelling in scrotal area likely third spacing from liver cirrhosis. Hospital course is complicated due to multiple blood work abnormalities, anasarca and ongoing mental status changes   Anticipating dc and family working on plan.  Consult is for review of medical treatment options, clarification of goals of care and end of life issues, disposition and options, and symptom recommendation.  This NP Lorinda Creed reviewed medical records, received report from team, assessed the patient and then meet at the patient's bedside along with his daughter [name] # [phone] to discuss diagnosis prognosis, GOC, EOL wishes disposition and options.   A detailed discussion was had today regarding advanced directives.  Concepts specific to code status, artifical feeding and hydration, continued IV antibiotics and rehospitalization was had.  The difference between a aggressive medical intervention path  and a palliative comfort care path for this patient at this time was had.  Values and goals of care important to patient and family were attempted to be elicited.  The importance of understanding limited medical options.   Concept of Hospice and Palliative Care were discussed. (he has had hospice benefit in the past)  Natural trajectory and expectations  at EOL were discussed.  Questions and concerns addressed.  Hard Choices booklet left for review. Family encouraged to call with questions or concerns.  PMT will continue to support holistically.    Goals of Care: 1.  Code Status:  Partial  -no CPR, No cardioversion, No intubation   2. Scope of Treatment: 1. Vital Signs:per unit  2. Respiratory/Oxygen:as needed for treatment 3. Nutritional Support/Tube Feeds:would consider 4. Antibiotics:yes 5. Blood Products:yes 6. IVF:yes 7. Review of Medications to be discontinued:continue all meds  To treat the treatable 8. Labs:yes  Patient and family are hopeful for prolonged life.  Open to all available and offered medical interventions   4. Disposition: Patient is agreeable to short term SNF for rehabilitation with Palliative services to follow on dc   3. Symptom Management:   1. Anxiety/Agitation:Xanax  1 mg qhs prn 2. Weakness: continue to work with PT, again stressed with patient his active part in his wellness 3. Bowel Regimen:  4. Edema: recommend compression intervention, patient encouraged to incorporate self exercise (any movement is better thatn none)  4. Psychosocial:  Emotionasl support offered to patient and family.  Patietn's daughter's husband dies last 05-10-2022, still greving loss.      Patient Documents Completed or Given: Document Given Completed  Advanced Directives Pkt    MOST yes   DNR    Gone from My Sight    Hard Choices  yes    Brief HPI: Tony Boyd is an 57 y.o. male with a PMH of liver cirrhosis (MELD 31) related to hepatitis C and alcohol abuse, diabetes, esophageal varices, bipolar disorder, recently hospitalized in 08/2012 for acute  encephalopathy who presented to [facility] ED 10/06/2012 with worsening LE swelling And scrotal edema. Scrotal US demonstrated marked soft tissue swelling in scrotal area likely third spacing from liver cirrhosis. Hospital course is complicated due to multiple blood work  abnormalities, anasarca and ongoing mental status changes   Anticipating dc and family working on plan.     ROS: weakness, limited mobility related to edema    PMH:  Past Medical History  Diagnosis Date  . Cirrhosis of liver   . Hepatitis C   . ETOH abuse   . Hypothyroid   . Psychosis   . Bipolar disorder   . Seizures   . Arthritis   . Back pain   . Coagulopathy     Hx of  . Thrombocytopenia     Hx of  . Pancytopenia     Hx of  . H/O hypokalemia   . Hyponatremia     Hx of  . Korsakoff psychosis   . H/O abdominal abscess   . H/O esophageal varices   . Metabolic encephalopathy   . Hepatic encephalopathy   . Urinary urgency   . H/O renal failure   . H/O ascites   . Altered mental status   . Anemia   . Ascites   . Shortness of breath   . Peripheral vascular disease   . GERD (gastroesophageal reflux disease)   . Thrombocytopenia, acquired 06/02/2012  . Unspecified hypothyroidism 06/02/2012  . Pain in joint, pelvic region and thigh   . Edema   . Obesity, unspecified   . Chest pain, unspecified   . Pneumonitis due to inhalation of food or vomitus   . Other convulsions   . Cellulitis and abscess of upper arm and forearm   . Chronic hepatitis C without mention of hepatic coma   . Hypopotassemia   . Anemia, unspecified   . Other and unspecified coagulation defects   . Thrombocytopenia, unspecified   . Bipolar I disorder, most recent episode (or current) unspecified   . Unspecified psychosis   . Encephalopathy   . Esophageal varices without mention of bleeding   . Esophagitis, unspecified   . Abscess of liver(572.0)   . Chronic kidney disease, unspecified   . Lumbago   . Personal history of alcoholism   . Personal history of tobacco use, presenting hazards to health   . Cirrhosis of liver without mention of alcohol   . Pneumonitis due to inhalation of food or vomitus 09/19/2010  . Chronic hepatitis C without mention of hepatic coma   . Bipolar I disorder,  most recent episode (or current) unspecified   . Unspecified psychosis   . Other ascites   . Personal history of tobacco use, presenting hazards to health   . Cirrhosis of liver without mention of alcohol   . Type 2 diabetes mellitus with diabetic neuropathy      PSH: Past Surgical History  Procedure Laterality Date  . Colostomy    . Cholecystectomy    . Small intestine surgery    . Arm surgery      left arm  . US guided drain placement in ventral hernia abscess  07/21/2010  . Multiple picc line placements    . Esophagogastroduodenoscopy  06/28/2011    Procedure: ESOPHAGOGASTRODUODENOSCOPY (EGD);  Surgeon: Louis Meckel, MD;  Location: [facility];  Service: Endoscopy;  Laterality: N/A;  . Esophagogastroduodenoscopy N/A 05/06/2012    Procedure: ESOPHAGOGASTRODUODENOSCOPY (EGD);  Surgeon: Louis Meckel, MD;  Location: [facility];  Service:  Endoscopy;  Laterality: N/A;  . Esophageal banding N/A 05/06/2012    Procedure: ESOPHAGEAL BANDING;  Surgeon: Louis Meckel, MD;  Location: [facility];  Service: Endoscopy;  Laterality: N/A;   I have reviewed the FH and SH and  If appropriate update it with new information. Allergies  Allergen Reactions  . Ativan [Lorazepam] Other (See Comments)    "hallucinations"  . Droperidol Other (See Comments)    unknown  . Ketorolac Other (See Comments)    unknown  . Penicillins Swelling and Other (See Comments)    unkown  . Toradol [Ketorolac Tromethamine] Hives   Scheduled Meds: . bisacodyl  10 mg Rectal Once  . ferrous sulfate  325 mg Oral QHS  . folic acid  1 mg Oral Daily  . furosemide  80 mg Oral Q6H  . insulin aspart  0-15 Units Subcutaneous TID WC  . insulin aspart  0-5 Units Subcutaneous QHS  . insulin aspart  6 Units Subcutaneous TID WC  . insulin glargine  10 Units Subcutaneous QHS  . lactulose  30 g Oral 5 X Daily  . levETIRAcetam  500 mg Oral BID  . levothyroxine  75 mcg Oral QAC breakfast  . oxyCODONE  10 mg Oral Q4H  .  pantoprazole  40 mg Oral Daily  . potassium chloride  20 mEq Oral TID  . rifaximin  550 mg Oral BID  . risperiDONE  0.25 mg Oral BID  . spironolactone  100 mg Oral Daily   Continuous Infusions:  PRN Meds:.ALPRAZolam, bisacodyl, ondansetron (ZOFRAN) IV, ondansetron, polyethylene glycol    BP 118/59  Pulse 114  Temp(Src) 98.1 F (36.7 C) (Oral)  Resp 18  Ht 5\' 8"  (1.727 m)  Wt 147.5 kg (325 lb 2.9 oz)  BMI 49.45 kg/m2  SpO2 100%   PPS:30 % at best   Intake/Output Summary (Last 24 hours) at 10/09/12 1437 Last data filed at 10/09/12 1300  Gross per 24 hour  Intake    720 ml  Output    950 ml  Net   -230 ml   LBM: per patietn no stool for 4 days                       Stool Softner: Dulcolax supp today  Physical Exam:  General: chronically ill appearing HEENT:  Sclera jaundiced, mm no exudate Skin: noted juandice Chest:   Diminished in bases, CTA CVS: Tachycardic  Abdomen: obese +BS, soft NT Ext: + 4 edema X4 extremities Neuro:alert and oriented  Labs: CBC    Component Value Date/Time   WBC 3.4* 10/08/2012 0655   RBC 2.69* 10/08/2012 0655   HGB 9.8* 10/08/2012 0655   HCT 28.6* 10/08/2012 0655   PLT 35* 10/08/2012 0655   MCV 106.3* 10/08/2012 0655   MCH 36.4* 10/08/2012 0655   MCHC 34.3 10/08/2012 0655   RDW 17.0* 10/08/2012 0655   LYMPHSABS 0.5* 10/06/2012 1240   MONOABS 0.4 10/06/2012 1240   EOSABS 0.1 10/06/2012 1240   BASOSABS 0.0 10/06/2012 1240    BMET    Component Value Date/Time   NA 134* 10/08/2012 0655   NA 136 10/02/2012 1355   K 3.1* 10/08/2012 0655   CL 95* 10/08/2012 0655   CO2 35* 10/08/2012 0655   GLUCOSE 164* 10/08/2012 0655   GLUCOSE 86 10/02/2012 1355   BUN 7 10/08/2012 0655   BUN 9 10/02/2012 1355   CREATININE 0.69 10/08/2012 0655   CALCIUM 7.7* 10/08/2012 0655   GFRNONAA >  90 10/08/2012 0655   GFRAA >90 10/08/2012 0655    CMP     Component Value Date/Time   NA 134* 10/08/2012 0655   NA 136 10/02/2012 1355   K 3.1* 10/08/2012 0655   CL 95* 10/08/2012  0655   CO2 35* 10/08/2012 0655   GLUCOSE 164* 10/08/2012 0655   GLUCOSE 86 10/02/2012 1355   BUN 7 10/08/2012 0655   BUN 9 10/02/2012 1355   CREATININE 0.69 10/08/2012 0655   CALCIUM 7.7* 10/08/2012 0655   PROT 5.1* 10/07/2012 0355   PROT 5.9* 10/02/2012 1355   ALBUMIN 1.8* 10/07/2012 0355   AST 61* 10/07/2012 0355   ALT 32 10/07/2012 0355   ALKPHOS 185* 10/07/2012 0355   BILITOT 3.0* 10/07/2012 0355   GFRNONAA >90 10/08/2012 0655   GFRAA >90 10/08/2012 0655       Time In Time Out Total Time Spent with Patient Total Overall Time  1100 1230 80 min 90 min    Greater than 50%  of this time was spent counseling and coordinating care related to the above assessment and plan.  Lorinda Creed NP  Palliative Medicine Team Team Phone # [phone] Pager [phone]  Discussed with Dr Darnelle Catalan

## 2012-10-10 ENCOUNTER — Non-Acute Institutional Stay (SKILLED_NURSING_FACILITY): Payer: Medicare Other | Admitting: Internal Medicine

## 2012-10-10 ENCOUNTER — Encounter: Payer: Self-pay | Admitting: Internal Medicine

## 2012-10-10 DIAGNOSIS — R601 Generalized edema: Secondary | ICD-10-CM

## 2012-10-10 DIAGNOSIS — K7460 Unspecified cirrhosis of liver: Secondary | ICD-10-CM

## 2012-10-10 DIAGNOSIS — R609 Edema, unspecified: Secondary | ICD-10-CM

## 2012-10-10 DIAGNOSIS — E119 Type 2 diabetes mellitus without complications: Secondary | ICD-10-CM

## 2012-10-10 DIAGNOSIS — E876 Hypokalemia: Secondary | ICD-10-CM

## 2012-10-10 DIAGNOSIS — D689 Coagulation defect, unspecified: Secondary | ICD-10-CM

## 2012-10-10 DIAGNOSIS — E039 Hypothyroidism, unspecified: Secondary | ICD-10-CM

## 2012-10-10 DIAGNOSIS — G40909 Epilepsy, unspecified, not intractable, without status epilepticus: Secondary | ICD-10-CM

## 2012-10-10 DIAGNOSIS — E440 Moderate protein-calorie malnutrition: Secondary | ICD-10-CM

## 2012-10-10 DIAGNOSIS — D638 Anemia in other chronic diseases classified elsewhere: Secondary | ICD-10-CM

## 2012-10-10 DIAGNOSIS — E871 Hypo-osmolality and hyponatremia: Secondary | ICD-10-CM

## 2012-10-10 DIAGNOSIS — K7290 Hepatic failure, unspecified without coma: Secondary | ICD-10-CM

## 2012-10-10 LAB — BASIC METABOLIC PANEL
CO2: 34 mEq/L — ABNORMAL HIGH (ref 19–32)
Chloride: 92 mEq/L — ABNORMAL LOW (ref 96–112)
Glucose, Bld: 151 mg/dL — ABNORMAL HIGH (ref 70–99)
Potassium: 3.7 mEq/L (ref 3.5–5.1)
Sodium: 127 mEq/L — ABNORMAL LOW (ref 135–145)

## 2012-10-10 LAB — GLUCOSE, CAPILLARY: Glucose-Capillary: 194 mg/dL — ABNORMAL HIGH (ref 70–99)

## 2012-10-10 MED ORDER — ALPRAZOLAM 1 MG PO TABS: 1.0000 mg | ORAL_TABLET | Freq: Every evening | ORAL | Status: DC | PRN

## 2012-10-10 MED ORDER — OXYCODONE HCL 10 MG PO TABS: 10.0000 mg | ORAL_TABLET | ORAL | Status: DC

## 2012-10-10 MED ORDER — FUROSEMIDE 80 MG PO TABS: 160.0000 mg | ORAL_TABLET | Freq: Two times a day (BID) | ORAL | Status: DC

## 2012-10-10 MED ORDER — INSULIN GLARGINE 100 UNIT/ML SOLOSTAR PEN: 20.0000 [IU] | PEN_INJECTOR | Freq: Every day | SUBCUTANEOUS | Status: DC

## 2012-10-10 MED ORDER — INSULIN ASPART 100 UNIT/ML ~~LOC~~ SOLN: 6.0000 [IU] | Freq: Three times a day (TID) | SUBCUTANEOUS | Status: DC

## 2012-10-10 MED ORDER — POLYETHYLENE GLYCOL 3350 17 G PO PACK: 17.0000 g | PACK | Freq: Every day | ORAL | Status: DC | PRN

## 2012-10-10 MED ORDER — BISACODYL 10 MG RE SUPP: 10.0000 mg | Freq: Every day | RECTAL | Status: DC | PRN

## 2012-10-10 MED ORDER — POTASSIUM CHLORIDE ER 10 MEQ PO CPCR: 20.0000 meq | ORAL_CAPSULE | Freq: Three times a day (TID) | ORAL | Status: DC

## 2012-10-10 MED ORDER — SPIRONOLACTONE 100 MG PO TABS: 100.0000 mg | ORAL_TABLET | Freq: Every day | ORAL | Status: DC

## 2012-10-10 MED ORDER — INSULIN GLARGINE 100 UNIT/ML SOLOSTAR PEN: 10.0000 [IU] | PEN_INJECTOR | Freq: Every day | SUBCUTANEOUS | Status: DC

## 2012-10-10 NOTE — Assessment & Plan Note (Signed)
From cirrhosis-cont lactulose, and rifaximn

## 2012-10-10 NOTE — Assessment & Plan Note (Signed)
Continue keppra

## 2012-10-10 NOTE — Progress Notes (Signed)
Pt for discharge to [facility].  CSW facilitated pt discharge needs including contacting facility, faxing pt d/c information via [facility], discussing with pt at bedside and pt daughter via telephone, providing RN phone number to call report, and arranging ambulance transportation for pt to [facility] (Service Request ID#: [id number]).  No further social work needs identified at this time.  CSW signing off.   [name], MSW, LCSWA  Clinical Social Work [phone]

## 2012-10-10 NOTE — Discharge Summary (Signed)
Physician Discharge Summary  [name] GEX:[id number] DOB: Mar 17, 1955 DOA: 10/06/2012  PCP: [name], DO  Admit date: 10/06/2012 Discharge date: 10/10/2012  Recommendations for Outpatient Follow-up:  1. The patient is being discharged to a skilled nursing facility for further rehabilitation. 2. Recommend Unna boots changed weekly. 3. Recommend a check of his electrolytes and creatinine every 3 days while on high dose Lasix. 4. Patient is being discharged with a Foley catheter in place secondary to scrotal/penile edema. This can be discontinued when edema improved. Apply barrier cream to scrotum to repel moisture and decrease discomfort/promote healing.  Discharge Diagnoses:  Principal Problem:    Hepatic encephalopathy Active Problems:    Coagulopathy    Hyponatremia    Cirrhosis    Seizure disorder    Thrombocytopenia, acquired    Hypothyroidism    Diabetes mellitus    Anasarca    Scrotal swelling    Anemia of chronic disease    Moderate protein-calorie malnutrition    Hypokalemia    Palliative care encounter    Weakness generalized   Discharge Condition: Improved.  Diet recommendation: Low-sodium, heart healthy.  History of present illness:  Tony Boyd is an 57 y.o. male with a PMH of liver cirrhosis (MELD 31) related to hepatitis C and alcohol abuse, diabetes, esophageal varices, bipolar disorder, recently hospitalized in 08/2012 for acute encephalopathy who presented to [facility] ED 10/06/2012 with worsening LE swelling And scrotal edema. Scrotal US demonstrated marked soft tissue swelling in scrotal area likely third spacing from liver cirrhosis. Hospital course is complicated due to multiple blood work abnormalities, anasarca and ongoing mental status changes.   Hospital Course by problem:  Principal Problem:  Acute hepatic encephalopathy  - Continue lactulose 30 g PO x 5 daily, titrate to 3 BM a day.  - Continue rifaximin 500 mg PO BID.  - PT  evaluation performed 10/08/2012. SNF recommended.  Active Problems:  Anasarca, Scrotal swelling  - Secondary to history of liver cirrhosis.  - Continue lasix 160 mg twice a day and spironolactone 100 mg daily.  - Still with massive volume overload, which is slowly improving.  -Recommend close monitoring of electrolytes and renal function while on high-dose Lasix.  - Continue unna boots, change weekly.  Coagulopathy  - Secondary to liver cirrhosis.  Hyponatremia  - Secondary to volume overload, cirrhosis physiology.  Hypokalemia  - Secondary to GI losses, lasix.  - Continue to supplement; on potassium 20 meq TID, with discharge potassium within normal limits.  Liver Cirrhosis  - Continue lasix, spironolactone, rifaximin and lactulose.  Seizure disorder  - Continue Keppra 500 mg PO BID.  Thrombocytopenia, acquired  - Secondary to liver disease.  Hypothyroidism  - Continue synthroid.  Diabetes mellitus  - Continue novolog 6 units TID and lantus 20 units at bedtime. Recommend close outpatient monitoring of glycemic control with adjustment of insulin as needed.  Metabolic alkalosis  - Likely contraction alkalosis secondary to diuretics.  Anemia of chronic disease  - Hemoglobin stable, no indications for transfusion.  Moderate protein-calorie malnutrition  - Nutrition consulted.  Procedures:  None.  Consultations:  Lorinda Creed, NP, Palliative Care.  Discharge Exam: Filed Vitals:   10/10/12 0534  BP: 119/58  Pulse: 92  Temp: 98.6 F (37 C)  Resp: 16   Filed Vitals:   10/09/12 0649 10/09/12 1340 10/09/12 2132 10/10/12 0534  BP: 101/71 118/59 114/59 119/58  Pulse: 95 114 96 92  Temp: 98.6 F (37 C) 98.1 F (36.7 C) 98.8 F (37.1 C)  98.6 F (37 C)  TempSrc: Oral  Oral Oral  Resp: 16 18 16 16   Height:      Weight:      SpO2: 100% 100% 96% 100%    Gen:  NAD Cardiovascular:  RRR, No M/R/G Respiratory: Lungs CTAB Gastrointestinal: Abdomen soft, NT/ND with normal  active bowel sounds. Extremities: 3+ edema, Unna boots in place   Discharge Instructions      Discharge Orders   Future Appointments Provider Department Dept Phone   10/13/2012 10:30 AM Edison Pace, RPH-CPP [facility] [phone]   11/03/2012 11:00 AM Kermit Balo, DO [facility] [phone]   Future Orders Complete By Expires   Call MD for:  extreme fatigue  As directed    Call MD for:  persistant dizziness or light-headedness  As directed    Call MD for:  persistant nausea and vomiting  As directed    Call MD for:  severe uncontrolled pain  As directed    Diet - low sodium heart healthy  As directed    Diet Carb Modified  As directed    Discharge instructions  As directed    Comments:     -You are being discharged to a skilled nursing facility for further rehabilitation. -We recommend Unna boots be changed weekly. -We recommend a check of your electrolytes and creatinine every 3 days while on high dose Lasix (blood tests). -You are being discharged with a Foley catheter in place secondary to scrotal/penile edema. This can be discontinued when edema improved. -Apply barrier cream to scrotum to repel moisture and decrease discomfort/promote healing.  You were cared for by Dr. Hillery Aldo  (a hospitalist) during your hospital stay. If you have any questions about your discharge medications or the care you received while you were in the hospital after you are discharged, you can call the unit and ask to speak with the hospitalist on call if the hospitalist that took care of you is not available. Once you are discharged, your primary care physician will handle any further medical issues. Please note that NO REFILLS for any discharge medications will be authorized once you are discharged, as it is imperative that you return to your primary care physician (or establish a relationship with a primary care physician if you do not have one) for your aftercare needs so that  they can reassess your need for medications and monitor your lab values.  Any outstanding tests can be reviewed by your PCP at your follow up visit.  It is also important to review any medicine changes with your PCP.  Please bring these d/c instructions with you to your next visit so your physician can review these changes with you.  If you do not have a primary care physician, you can call [phone] for a physician referral.  It is highly recommended that you obtain a PCP for hospital follow up.   Increase activity slowly  As directed    Walk with assistance  As directed    Walker   As directed        Medication List    STOP taking these medications       NOVOLOG FLEXPEN 100 UNIT/ML Sopn FlexPen  Generic drug:  insulin aspart  Replaced by:  insulin aspart 100 UNIT/ML injection      TAKE these medications       ALPRAZolam 1 MG tablet  Commonly known as:  XANAX  Take 1 tablet (1 mg total) by mouth at  bedtime as needed for sleep.     bisacodyl 10 MG suppository  Commonly known as:  DULCOLAX  Place 1 suppository (10 mg total) rectally daily as needed.     ferrous sulfate 325 (65 FE) MG tablet  Take 325 mg by mouth at bedtime.     folic acid 1 MG tablet  Commonly known as:  FOLVITE  Take 1 mg by mouth daily.     furosemide 80 MG tablet  Commonly known as:  LASIX  Take 2 tablets (160 mg total) by mouth 2 (two) times daily.     insulin aspart 100 UNIT/ML injection  Commonly known as:  novoLOG  Inject 6 Units into the skin 3 (three) times daily with meals.     Insulin Glargine 100 UNIT/ML Sopn  Commonly known as:  LANTUS SOLOSTAR  Inject 20 Units into the skin at bedtime.     lactulose 10 GM/15ML solution  Commonly known as:  CHRONULAC  Take 45 mLs (30 g total) by mouth 5 (five) times daily. Hold if more than 5 stools in one day     levETIRAcetam 500 MG tablet  Commonly known as:  KEPPRA  Take 500 mg by mouth 2 (two) times daily.     levothyroxine 75 MCG tablet   Commonly known as:  SYNTHROID, LEVOTHROID  Take 75 mcg by mouth daily.     omeprazole 20 MG capsule  Commonly known as:  PRILOSEC  Take 20 mg by mouth daily.     Oxycodone HCl 10 MG Tabs  Take 1 tablet (10 mg total) by mouth every 4 (four) hours.     polyethylene glycol packet  Commonly known as:  MIRALAX / GLYCOLAX  Take 17 g by mouth daily as needed.     potassium chloride 10 MEQ CR capsule  Commonly known as:  MICRO-K  Take 2 capsules (20 mEq total) by mouth 3 (three) times daily.     rifaximin 550 MG Tabs tablet  Commonly known as:  XIFAXAN  Take 550 mg by mouth 2 (two) times daily.     risperiDONE 0.25 MG tablet  Commonly known as:  RISPERDAL  Take 1 tablet (0.25 mg total) by mouth 2 (two) times daily. Hold for sedation     spironolactone 100 MG tablet  Commonly known as:  ALDACTONE  Take 1 tablet (100 mg total) by mouth daily.          The results of significant diagnostics from this hospitalization (including imaging, microbiology, ancillary and laboratory) are listed below for reference.    Significant Diagnostic Studies: Ct Head Wo Contrast  09/16/2012   *RADIOLOGY REPORT*  Clinical Data: Seizures.  Evaluate for encephalopathy.  CT HEAD WITHOUT CONTRAST  Technique:  Contiguous axial images were obtained from the base of the skull through the vertex without contrast.  Comparison: 08/21/2012  Findings: The ventricles and sulci are symmetrical without significant effacement, displacement, or dilatation. No mass effect or midline shift. No abnormal extra-axial fluid collections. The grey-white matter junction is distinct. Basal cisterns are not effaced. No acute intracranial hemorrhage. No depressed skull fractures.  Visualized paranasal sinuses and mastoid air cells are not opacified. No significant change since previous study.  IMPRESSION: No acute intracranial abnormalities identified.   Original Report Authenticated By: Burman Nieves, M.D.   US Abdomen  Complete  09/17/2012   *RADIOLOGY REPORT*  Clinical Data:  Abnormal liver function tests, abdominal pain.  ABDOMINAL ULTRASOUND COMPLETE  Comparison:  CT scan of September 11, 2010.  Findings:  Gallbladder:  Status post cholecystectomy.  Common Bile Duct:  Measures 7 mm which is within normal limits in caliber.  Liver: The liver is heterogeneous in echogenicity with nodular margins consistent with hepatic cirrhosis.18 mm oval shaped cyst is seen in right hepatic lobe.  IVC:  Visualized portion appears normal.  Pancreas:  Not well visualized due to overlying bowel gas.  Spleen:  Measures 13 x 17 x 6.4 cm with calculated volume of 730 cubic centimeters; this is consistent with mild splenomegaly.  Right kidney:  Measures 11.8 cm in length. Normal in size and parenchymal echogenicity.  No evidence of mass or hydronephrosis.  Left kidney:  4.7 cm cyst is seen in upper pole. Measures 10.9 cm in length. Normal in size and parenchymal echogenicity.  No evidence of mass or hydronephrosis.  Abdominal Aorta:  Not well visualized due to overlying bowel gas.  IMPRESSION: Hepatic cirrhosis.  Status post cholecystectomy.  Mild splenomegaly.  Left renal cyst.  No other abnormality seen in the abdomen.   Original Report Authenticated By: Lupita Raider.,  M.D.   US Scrotum  10/06/2012   *RADIOLOGY REPORT*  Clinical Data:  Scrotal pain and swelling  SCROTAL ULTRASOUND DOPPLER ULTRASOUND OF THE TESTICLES  Technique: Complete ultrasound examination of the testicles, epididymis, and other scrotal structures was performed.  Color and spectral Doppler ultrasound were also utilized to evaluate blood flow to the testicles.  Comparison:  None  Findings:  Right testis:  The right testicle measures 2.7 x 2.1 x 1.7 cm. Color Doppler flow is demonstrated.  Venous waveforms are present. Arterial wave forms are not demonstrated, potentially secondary to difficulty visualizing the testicle from extensive scrotal swelling.  Left testis:  The left  testicle measures 4.0 x 3.2 x 2.8 cm.  Color Doppler flow is present.  Venous waveforms are present.  Arterial wave forms are not demonstrated, potentially secondary to difficulty visualizing the testicle from extensive scrotal swelling  Right epididymis:  Normal in size in appearance measuring 1.4 x 1.0 x 1.3 cm  Left epididymis:  Normal in size and appearance measuring 1.3 x 0.8 x 1.2 cm.  Hydrocele:  Small left hydrocele.  Varicocele:  Absent  Pulsed Doppler interrogation of both testes demonstrates normal venous waveforms.  Arterial wave forms are not able to be demonstrated, potentially secondary to extensive scrotal soft tissue swelling.  IMPRESSION: 1.  Marked soft tissue swelling/edema of the scrotum.  This is nonspecific and may be secondary to third spacing of fluid from history of cirrhosis or infection.  2. Note arterial waveforms are not able to be demonstrated in either testicle likely secondary to marked swelling of the scrotum. Color doppler flow is present.  Discussed with PA Pisciotta at 01:45 p.m. on 08/02 01/15/2013.   Original Report Authenticated By: Annia Belt, M.D   US Abdomen Limited  10/06/2012   *RADIOLOGY REPORT*  Limited abdominal ultrasound  History:  Abdominal distension  Findings:  No demonstrable ascites.  There is peristalsing gas throughout the abdomen in all quadrants.  Conclusion:  No demonstrable ascites.   Original Report Authenticated By: Bretta Bang, M.D.   [place] Art/ven Flow Abd Pelv Doppler  10/06/2012   *RADIOLOGY REPORT*  Clinical Data:  Scrotal pain and swelling  SCROTAL ULTRASOUND DOPPLER ULTRASOUND OF THE TESTICLES  Technique: Complete ultrasound examination of the testicles, epididymis, and other scrotal structures was performed.  Color and spectral Doppler ultrasound were also utilized to evaluate blood flow to the testicles.  Comparison:  None  Findings:  Right testis:  The right testicle measures 2.7 x 2.1 x 1.7 cm. Color Doppler flow is demonstrated.   Venous waveforms are present. Arterial wave forms are not demonstrated, potentially secondary to difficulty visualizing the testicle from extensive scrotal swelling.  Left testis:  The left testicle measures 4.0 x 3.2 x 2.8 cm.  Color Doppler flow is present.  Venous waveforms are present.  Arterial wave forms are not demonstrated, potentially secondary to difficulty visualizing the testicle from extensive scrotal swelling  Right epididymis:  Normal in size in appearance measuring 1.4 x 1.0 x 1.3 cm  Left epididymis:  Normal in size and appearance measuring 1.3 x 0.8 x 1.2 cm.  Hydrocele:  Small left hydrocele.  Varicocele:  Absent  Pulsed Doppler interrogation of both testes demonstrates normal venous waveforms.  Arterial wave forms are not able to be demonstrated, potentially secondary to extensive scrotal soft tissue swelling.  IMPRESSION: 1.  Marked soft tissue swelling/edema of the scrotum.  This is nonspecific and may be secondary to third spacing of fluid from history of cirrhosis or infection.  2. Note arterial waveforms are not able to be demonstrated in either testicle likely secondary to marked swelling of the scrotum. Color doppler flow is present.  Discussed with PA Pisciotta at 01:45 p.m. on 08/02 01/15/2013.   Original Report Authenticated By: Annia Belt, M.D   Dg Chest Port 1 View  09/16/2012   *RADIOLOGY REPORT*  Clinical Data: Altered mental status.  Mid abdominal pain.  PORTABLE CHEST - 1 VIEW  Comparison: 08/21/2012  Findings: Normal heart size and pulmonary vascularity. Peribronchial thickening and central interstitial changes suggesting chronic bronchitis.  No focal airspace disease or consolidation in the lungs.  No blunting of costophrenic angles. No pneumothorax.  Mediastinal contours appear intact.  No significant change since previous study.  IMPRESSION: Chronic bronchitic changes.  No evidence of active infiltration.   Original Report Authenticated By: Burman Nieves, M.D.   Dg Abd  Portable 1v  09/16/2012   *RADIOLOGY REPORT*  Clinical Data: Abdominal pain.  Altered mental status.  PORTABLE ABDOMEN - 1 VIEW  Comparison: CT abdomen and pelvis 09/11/2010  Findings: The entire abdomen is not included within the field of view, limiting its evaluation.  Diffuse prominence of gas-filled mid abdominal small bowel and colon. Similar changes were demonstrated on the previous CT abdomen.  Findings likely to represent ileus.  No radiopaque stones visualized.  Degenerative changes in the lumbar spine.  IMPRESSION: Prominent gas distended small and large bowel throughout the abdomen most suggestive of ileus.   Original Report Authenticated By: Burman Nieves, M.D.    Labs:  Basic Metabolic Panel:  Recent Labs Lab 10/06/12 1240 10/06/12 1253 10/06/12 1630 10/07/12 0355 10/08/12 0655 10/10/12 0355  NA 134* 138  --  135 134* 127*  K 3.2* 3.0*  --  2.9* 3.1* 3.7  CL 99 96  --  99 95* 92*  CO2 32  --   --  34* 35* 34*  GLUCOSE 133* 128*  --  167* 164* 151*  BUN 8 6  --  8 7 8   CREATININE 0.65 0.80  --  0.72 0.69 0.76  CALCIUM 8.0*  --   --  8.0* 7.7* 7.8*  MG  --   --  1.5  --   --   --    GFR Estimated Creatinine Clearance: 144.1 ml/min (by C-G formula based on Cr of 0.76). Liver Function Tests:  Recent Labs Lab 10/06/12 1240 10/07/12 0355  AST 72* 61*  ALT 35 32  ALKPHOS 185* 185*  BILITOT 3.8* 3.0*  PROT 5.5* 5.1*  ALBUMIN 1.9* 1.8*    Recent Labs Lab 10/06/12 1240  LIPASE 33    Recent Labs Lab 10/06/12 1240  AMMONIA 80*   Coagulation profile  Recent Labs Lab 10/06/12 1240 10/07/12 0355  INR 1.85* 1.85*    CBC:  Recent Labs Lab 10/06/12 1240 10/06/12 1253 10/08/12 0655  WBC 4.5  --  3.4*  NEUTROABS 3.4  --   --   HGB 10.4* 9.9* 9.8*  HCT 30.2* 29.0* 28.6*  MCV 104.5*  --  106.3*  PLT 46*  --  35*   CBG:  Recent Labs Lab 10/09/12 0735 10/09/12 1200 10/09/12 1740 10/09/12 2131 10/10/12 0723  GLUCAP 150* 234* 214* 202* 161*    Microbiology No results found for this or any previous visit (from the past 240 hour(s)).  Time coordinating discharge: 35 minutes.  Signed:  Matt Delpizzo  Pager [phone] [facility] 10/10/2012, 10:50 AM

## 2012-10-10 NOTE — Progress Notes (Signed)
MRN: [id number] Name: Vicky Mccanless  Sex: male Age: 57 y.o. DOB: 1955-05-30  [facility] #: [id number] Facility/Room: 115 Level Of Care: SNF Provider: Merrilee Seashore D Emergency Contacts: Extended Emergency Contact Information Primary Emergency Contact: Charles,[name] Address: [address]          [place], [place] [address] [place] of [place] Home Phone: [phone] Mobile Phone: [phone] Relation: Daughter  Code Status: FULL  Allergies: Ativan; Droperidol; Ketorolac; Penicillins; and Toradol  Chief Complaint  Patient presents with  . nursing home admission    HPI: Patient is 57 y.o. male who has ETOH cirrhosis, who was encephalopathic in July and returned to hospital for LE and scrotal edema. He is here for continuation of diuresis.  Past Medical History  Diagnosis Date  . Cirrhosis of liver   . Hepatitis C   . ETOH abuse   . Hypothyroid   . Psychosis   . Bipolar disorder   . Seizures   . Arthritis   . Back pain   . Coagulopathy     Hx of  . Thrombocytopenia     Hx of  . Pancytopenia     Hx of  . H/O hypokalemia   . Hyponatremia     Hx of  . Korsakoff psychosis   . H/O abdominal abscess   . H/O esophageal varices   . Metabolic encephalopathy   . Hepatic encephalopathy   . Urinary urgency   . H/O renal failure   . H/O ascites   . Altered mental status   . Anemia   . Ascites   . Shortness of breath   . Peripheral vascular disease   . GERD (gastroesophageal reflux disease)   . Thrombocytopenia, acquired 06/02/2012  . Unspecified hypothyroidism 06/02/2012  . Pain in joint, pelvic region and thigh   . Edema   . Obesity, unspecified   . Chest pain, unspecified   . Pneumonitis due to inhalation of food or vomitus   . Other convulsions   . Cellulitis and abscess of upper arm and forearm   . Chronic hepatitis C without mention of hepatic coma   . Hypopotassemia   . Anemia, unspecified   . Other and unspecified coagulation defects   . Thrombocytopenia,  unspecified   . Bipolar I disorder, most recent episode (or current) unspecified   . Unspecified psychosis   . Encephalopathy   . Esophageal varices without mention of bleeding   . Esophagitis, unspecified   . Abscess of liver(572.0)   . Chronic kidney disease, unspecified   . Lumbago   . Personal history of alcoholism   . Personal history of tobacco use, presenting hazards to health   . Cirrhosis of liver without mention of alcohol   . Pneumonitis due to inhalation of food or vomitus 09/19/2010  . Chronic hepatitis C without mention of hepatic coma   . Bipolar I disorder, most recent episode (or current) unspecified   . Unspecified psychosis   . Other ascites   . Personal history of tobacco use, presenting hazards to health   . Cirrhosis of liver without mention of alcohol   . Type 2 diabetes mellitus with diabetic neuropathy     Past Surgical History  Procedure Laterality Date  . Colostomy    . Cholecystectomy    . Small intestine surgery    . Arm surgery      left arm  . US guided drain placement in ventral hernia abscess  07/21/2010  . Multiple picc line placements    .  Esophagogastroduodenoscopy  06/28/2011    Procedure: ESOPHAGOGASTRODUODENOSCOPY (EGD);  Surgeon: Louis Meckel, MD;  Location: [facility];  Service: Endoscopy;  Laterality: N/A;  . Esophagogastroduodenoscopy N/A 05/06/2012    Procedure: ESOPHAGOGASTRODUODENOSCOPY (EGD);  Surgeon: Louis Meckel, MD;  Location: [facility];  Service: Endoscopy;  Laterality: N/A;  . Esophageal banding N/A 05/06/2012    Procedure: ESOPHAGEAL BANDING;  Surgeon: Louis Meckel, MD;  Location: [facility];  Service: Endoscopy;  Laterality: N/A;      Medication List       This list is accurate as of: 10/10/12  3:43 PM.  Always use your most recent med list.               ALPRAZolam 1 MG tablet  Commonly known as:  XANAX  Take 1 tablet (1 mg total) by mouth at bedtime as needed for sleep.     bisacodyl 10 MG  suppository  Commonly known as:  DULCOLAX  Place 1 suppository (10 mg total) rectally daily as needed.     ferrous sulfate 325 (65 FE) MG tablet  Take 325 mg by mouth at bedtime.     folic acid 1 MG tablet  Commonly known as:  FOLVITE  Take 1 mg by mouth daily.     furosemide 80 MG tablet  Commonly known as:  LASIX  Take 2 tablets (160 mg total) by mouth 2 (two) times daily.     insulin aspart 100 UNIT/ML injection  Commonly known as:  novoLOG  Inject 6 Units into the skin 3 (three) times daily with meals.     Insulin Glargine 100 UNIT/ML Sopn  Commonly known as:  LANTUS SOLOSTAR  Inject 20 Units into the skin at bedtime.     lactulose 10 GM/15ML solution  Commonly known as:  CHRONULAC  Take 45 mLs (30 g total) by mouth 5 (five) times daily. Hold if more than 5 stools in one day     levETIRAcetam 500 MG tablet  Commonly known as:  KEPPRA  Take 500 mg by mouth 2 (two) times daily.     levothyroxine 75 MCG tablet  Commonly known as:  SYNTHROID, LEVOTHROID  Take 75 mcg by mouth daily.     omeprazole 20 MG capsule  Commonly known as:  PRILOSEC  Take 20 mg by mouth daily.     Oxycodone HCl 10 MG Tabs  Take 10 mg by mouth every 4 (four) hours. Hold for sedation     polyethylene glycol packet  Commonly known as:  MIRALAX / GLYCOLAX  Take 17 g by mouth daily as needed.     potassium chloride 10 MEQ CR capsule  Commonly known as:  MICRO-K  Take 2 capsules (20 mEq total) by mouth 3 (three) times daily.     rifaximin 550 MG Tabs tablet  Commonly known as:  XIFAXAN  Take 550 mg by mouth 2 (two) times daily.     risperiDONE 0.25 MG tablet  Commonly known as:  RISPERDAL  Take 1 tablet (0.25 mg total) by mouth 2 (two) times daily. Hold for sedation     spironolactone 100 MG tablet  Commonly known as:  ALDACTONE  Take 1 tablet (100 mg total) by mouth daily.        Meds ordered this encounter  Medications  . Oxycodone HCl 10 MG TABS    Sig: Take 10 mg by mouth every  4 (four) hours. Hold for sedation    Immunization History  Administered Date(s) Administered  .  Pneumococcal Polysaccharide 08/09/2012    History  Substance Use Topics  . Smoking status: Former Smoker    Types: Cigarettes    Quit date: 02/15/2008  . Smokeless tobacco: Never Used  . Alcohol Use: No    Family history is noncontributory    Review of Systems  DATA OBTAINED: from patient, no c/o except too weak to walk today GENERAL: no fevers, fatigue, appetite changes SKIN: No itching, rash or wounds EYES: No eye pain, redness, discharge EARS: No earache, tinnitus, change in hearing NOSE: No congestion, drainage or bleeding  MOUTH/THROAT: No mouth or tooth pain, No sore throat, No difficulty chewing or swallowing  RESPIRATORY: No cough, wheezing, SOB CARDIAC: No chest pain, palpitations, lower extremity edema  GI: No abdominal pain, No N/V/D or constipation, No heartburn or reflux  GU: No dysuria, frequency or urgency, or incontinence  MUSCULOSKELETAL:chronic LBP-taken narcotics for years NEUROLOGIC: no c/o PSYCHIATRIC: No overt anxiety or sadness. Sleeps well. No behavior issue.  AMBULATION:  With heavy assist and WC today   Filed Vitals:   10/10/12 1539  BP: 123/64  Pulse: 101  Temp: 98.2 F (36.8 C)  Resp: 22    Physical Exam  GENERAL APPEARANCE: Alert, conversant. Appropriately groomed. No acute distress.  SKIN: No diaphoresis rash,mildly icteric HEAD: Normocephalic, atraumatic  EYES: Conjunctiva/lids mildly icteric Pupils round, reactive. EOMs intact.  EARS: External exam WNL, canals clear. Hearing grossly normal.  NOSE: No deformity or discharge.  MOUTH/THROAT: Lips w/o lesions.  RESPIRATORY: Breathing is even, unlabored. Lung sounds are clear   CARDIOVASCULAR: Heart RRR no murmurs, rubs or gallops.Trace pitting anasarca in arms ,abs, pelvis;3+ pitting BLE with unaboots on GASTROINTESTINAL: Abdomen is soft, non-tender, protuberant,ascitic w/ normal bowel  sounds. GENITOURINARY: Bladder non tender,;scrotum hugely swollen MUSCULOSKELETAL: No abnormal joints or musculature NEUROLOGIC: Oriented X3. Cranial nerves 2-12 grossly intact. Moves all extremities no tremor. PSYCHIATRIC: Mood and affect appropriate to situation, no behavioral issues  Patient Active Problem List   Diagnosis Date Noted  . Palliative care encounter 10/09/2012  . Weakness generalized 10/09/2012  . Anemia of chronic disease 10/07/2012  . Moderate protein-calorie malnutrition 10/07/2012  . Hypokalemia 10/07/2012  . Anasarca 10/06/2012  . Scrotal swelling 10/06/2012  . Diabetes mellitus 08/08/2012  . Seizure disorder 06/02/2012  . Thrombocytopenia, acquired 06/02/2012  . Hypothyroidism 06/02/2012  . Esophageal varices without mention of bleeding 06/28/2011  . Cirrhosis 05/30/2011  . Hepatic encephalopathy 04/18/2011  . ETOH abuse 04/18/2011  . Hepatitis C 04/18/2011  . Coagulopathy 04/18/2011  . Hyponatremia 04/18/2011     CBC    Component Value Date/Time   WBC 3.4* 10/08/2012 0655   RBC 2.69* 10/08/2012 0655   HGB 9.8* 10/08/2012 0655   HCT 28.6* 10/08/2012 0655   PLT 35* 10/08/2012 0655   MCV 106.3* 10/08/2012 0655   LYMPHSABS 0.5* 10/06/2012 1240   MONOABS 0.4 10/06/2012 1240   EOSABS 0.1 10/06/2012 1240   BASOSABS 0.0 10/06/2012 1240    CMP     Component Value Date/Time   NA 127* 10/10/2012 0355   NA 136 10/02/2012 1355   K 3.7 10/10/2012 0355   CL 92* 10/10/2012 0355   CO2 34* 10/10/2012 0355   GLUCOSE 151* 10/10/2012 0355   GLUCOSE 86 10/02/2012 1355   BUN 8 10/10/2012 0355   BUN 9 10/02/2012 1355   CREATININE 0.76 10/10/2012 0355   CALCIUM 7.8* 10/10/2012 0355   PROT 5.1* 10/07/2012 0355   PROT 5.9* 10/02/2012 1355   ALBUMIN 1.8* 10/07/2012 0355  AST 61* 10/07/2012 0355   ALT 32 10/07/2012 0355   ALKPHOS 185* 10/07/2012 0355   BILITOT 3.0* 10/07/2012 0355   GFRNONAA >90 10/10/2012 0355   GFRAA >90 10/10/2012 0355   8/11- ammonia- 80,  Lipase 33  INR-  1.85  Assessment and Plan  Anasarca And LE edema and scrotal swelling-sec to cirrhosis-plan continue aggressive diuresis, Q3 days BMP and weekly unaboots  Coagulopathy Secondary to cirrhosis-baseline  Hyponatremia From cirrhosis and volume overload-will be following with labs  Hypokalemia From GI losses and diuresis-will be following with frequent labs;K+ repletion daily  Hepatic encephalopathy From cirrhosis-cont lactulose, and rifaximn  Seizure disorder Continue keppra  Anemia of chronic disease Stable -on iron and folate  Moderate protein-calorie malnutrition Low sodium, heart healthy and modified carb  Diabetes mellitus continue insulin regimen and check BS at breakfast and dinner  Hypothyroidism Continue syntroid  Cirrhosis From ETOH abuse-continue lasix, spironolactone,rifaximn, and lactulose    Margit Hanks, MD

## 2012-10-10 NOTE — Assessment & Plan Note (Addendum)
Low sodium, heart healthy and modified carb

## 2012-10-10 NOTE — Assessment & Plan Note (Signed)
Stable -on iron and folate

## 2012-10-10 NOTE — Assessment & Plan Note (Signed)
From ETOH abuse-continue lasix, spironolactone,rifaximn, and lactulose

## 2012-10-10 NOTE — Assessment & Plan Note (Signed)
From cirrhosis and volume overload-will be following with labs

## 2012-10-10 NOTE — Assessment & Plan Note (Signed)
And LE edema and scrotal swelling-sec to cirrhosis-plan continue aggressive diuresis, Q3 days BMP and weekly unaboots

## 2012-10-10 NOTE — Progress Notes (Signed)
Clinical Social Work Department CLINICAL SOCIAL WORK PLACEMENT NOTE 10/10/2012  Patient:  Tony Boyd, Tony Boyd  Account Number:  [id number] Admit date:  10/06/2012  Clinical Social Worker:  Jacelyn Grip  Date/time:  10/10/2012 01:00 PM  Clinical Social Work is seeking post-discharge placement for this patient at the following level of care:   SKILLED NURSING   (*CSW will update this form in Epic as items are completed)   10/08/2012  Patient/family provided with [facility] Department of Clinical Social Work's list of facilities offering this level of care within the geographic area requested by the patient (or if unable, by the patient's family).  10/08/2012  Patient/family informed of their freedom to choose among providers that offer the needed level of care, that participate in Medicare, Medicaid or managed care program needed by the patient, have an available bed and are willing to accept the patient.  10/08/2012  Patient/family informed of [facility]' ownership interest in [facility], as well as of the fact that they are under no obligation to receive care at this facility.  PASARR submitted to EDS on 10/08/2012 PASARR number received from [employer] on 10/08/2012- existing  FL2 transmitted to all facilities in geographic area requested by pt/family on  10/08/2012 FL2 transmitted to all facilities within larger geographic area on   Patient informed that his/her managed care company has contracts with or will negotiate with  certain facilities, including the following:     Patient/family informed of bed offers received:  10/09/2012 Patient chooses bed at [facility], [place] Physician recommends and patient chooses bed at    Patient to be transferred to [facility] on  10/10/2012 Patient to be transferred to facility by ambulance [facility])  The following physician request were entered in Epic:   Additional Comments:   [name], MSW,  LCSWA  Clinical Social Work [phone]

## 2012-10-10 NOTE — Progress Notes (Signed)
Pt. Was discharged to [facility] [place].  His discharge instructions, prescriptions, and packet were sent with transport on discharge.  He was transported with his foley in place per MD order.

## 2012-10-10 NOTE — Assessment & Plan Note (Signed)
Secondary to cirrhosis-baseline

## 2012-10-10 NOTE — Assessment & Plan Note (Signed)
From GI losses and diuresis-will be following with frequent labs;K+ repletion daily

## 2012-10-10 NOTE — Progress Notes (Signed)
Report was called and given to nurse at [facility].

## 2012-10-10 NOTE — Assessment & Plan Note (Signed)
continue insulin regimen and check BS at breakfast and dinner

## 2012-10-10 NOTE — Assessment & Plan Note (Signed)
Continue syntroid

## 2012-10-12 ENCOUNTER — Encounter (HOSPITAL_COMMUNITY): Payer: Self-pay

## 2012-10-12 ENCOUNTER — Inpatient Hospital Stay (HOSPITAL_COMMUNITY)
Admission: EM | Admit: 2012-10-12 | Discharge: 2012-10-17 | DRG: 432 | Disposition: A | Discharge status: Other Acute Inpatient Hospital | Payer: Medicare Other | Attending: Family Medicine | Admitting: Family Medicine

## 2012-10-12 ENCOUNTER — Emergency Department (HOSPITAL_COMMUNITY): Payer: Medicare Other

## 2012-10-12 ENCOUNTER — Inpatient Hospital Stay (HOSPITAL_COMMUNITY): Payer: Medicare Other

## 2012-10-12 DIAGNOSIS — D696 Thrombocytopenia, unspecified: Secondary | ICD-10-CM | POA: Diagnosis present

## 2012-10-12 DIAGNOSIS — K7290 Hepatic failure, unspecified without coma: Secondary | ICD-10-CM

## 2012-10-12 DIAGNOSIS — Z6841 Body Mass Index (BMI) 40.0 and over, adult: Secondary | ICD-10-CM

## 2012-10-12 DIAGNOSIS — E039 Hypothyroidism, unspecified: Secondary | ICD-10-CM | POA: Diagnosis present

## 2012-10-12 DIAGNOSIS — E43 Unspecified severe protein-calorie malnutrition: Secondary | ICD-10-CM | POA: Diagnosis present

## 2012-10-12 DIAGNOSIS — K7682 Hepatic encephalopathy: Secondary | ICD-10-CM | POA: Diagnosis present

## 2012-10-12 DIAGNOSIS — Z87891 Personal history of nicotine dependence: Secondary | ICD-10-CM

## 2012-10-12 DIAGNOSIS — G40909 Epilepsy, unspecified, not intractable, without status epilepticus: Secondary | ICD-10-CM

## 2012-10-12 DIAGNOSIS — F1010 Alcohol abuse, uncomplicated: Secondary | ICD-10-CM | POA: Diagnosis present

## 2012-10-12 DIAGNOSIS — E118 Type 2 diabetes mellitus with unspecified complications: Secondary | ICD-10-CM | POA: Diagnosis present

## 2012-10-12 DIAGNOSIS — R5381 Other malaise: Secondary | ICD-10-CM | POA: Diagnosis present

## 2012-10-12 DIAGNOSIS — N5089 Other specified disorders of the male genital organs: Secondary | ICD-10-CM | POA: Diagnosis present

## 2012-10-12 DIAGNOSIS — D689 Coagulation defect, unspecified: Secondary | ICD-10-CM | POA: Diagnosis present

## 2012-10-12 DIAGNOSIS — D539 Nutritional anemia, unspecified: Secondary | ICD-10-CM | POA: Diagnosis present

## 2012-10-12 DIAGNOSIS — R609 Edema, unspecified: Secondary | ICD-10-CM

## 2012-10-12 DIAGNOSIS — E440 Moderate protein-calorie malnutrition: Secondary | ICD-10-CM | POA: Diagnosis present

## 2012-10-12 DIAGNOSIS — B1920 Unspecified viral hepatitis C without hepatic coma: Secondary | ICD-10-CM | POA: Diagnosis present

## 2012-10-12 DIAGNOSIS — I8500 Esophageal varices without bleeding: Secondary | ICD-10-CM | POA: Diagnosis present

## 2012-10-12 DIAGNOSIS — K7460 Unspecified cirrhosis of liver: Secondary | ICD-10-CM

## 2012-10-12 DIAGNOSIS — E1149 Type 2 diabetes mellitus with other diabetic neurological complication: Secondary | ICD-10-CM | POA: Diagnosis present

## 2012-10-12 DIAGNOSIS — K7030 Alcoholic cirrhosis of liver without ascites: Principal | ICD-10-CM | POA: Diagnosis present

## 2012-10-12 DIAGNOSIS — R531 Weakness: Secondary | ICD-10-CM | POA: Diagnosis present

## 2012-10-12 DIAGNOSIS — E1142 Type 2 diabetes mellitus with diabetic polyneuropathy: Secondary | ICD-10-CM | POA: Diagnosis present

## 2012-10-12 DIAGNOSIS — D638 Anemia in other chronic diseases classified elsewhere: Secondary | ICD-10-CM | POA: Diagnosis present

## 2012-10-12 DIAGNOSIS — R601 Generalized edema: Secondary | ICD-10-CM

## 2012-10-12 DIAGNOSIS — E871 Hypo-osmolality and hyponatremia: Secondary | ICD-10-CM | POA: Diagnosis present

## 2012-10-12 DIAGNOSIS — R5383 Other fatigue: Secondary | ICD-10-CM | POA: Diagnosis present

## 2012-10-12 LAB — CBC WITH DIFFERENTIAL/PLATELET
Basophils Relative: 0 % (ref 0–1)
HCT: 32.2 % — ABNORMAL LOW (ref 39.0–52.0)
Hemoglobin: 11.2 g/dL — ABNORMAL LOW (ref 13.0–17.0)
MCHC: 34.8 g/dL (ref 30.0–36.0)
Monocytes Absolute: 0.7 10*3/uL (ref 0.1–1.0)
Monocytes Relative: 10 % (ref 3–12)
Neutro Abs: 5.3 10*3/uL (ref 1.7–7.7)

## 2012-10-12 LAB — GLUCOSE, CAPILLARY
Glucose-Capillary: 148 mg/dL — ABNORMAL HIGH (ref 70–99)
Glucose-Capillary: 162 mg/dL — ABNORMAL HIGH (ref 70–99)

## 2012-10-12 LAB — URINALYSIS, ROUTINE W REFLEX MICROSCOPIC
Ketones, ur: NEGATIVE mg/dL
Nitrite: NEGATIVE
Protein, ur: NEGATIVE mg/dL
Urobilinogen, UA: 2 mg/dL — ABNORMAL HIGH (ref 0.0–1.0)

## 2012-10-12 LAB — PROTIME-INR
INR: 1.79 — ABNORMAL HIGH (ref 0.00–1.49)
Prothrombin Time: 20.3 seconds — ABNORMAL HIGH (ref 11.6–15.2)

## 2012-10-12 LAB — COMPREHENSIVE METABOLIC PANEL
Albumin: 1.8 g/dL — ABNORMAL LOW (ref 3.5–5.2)
BUN: 14 mg/dL (ref 6–23)
CO2: 30 mEq/L (ref 19–32)
Chloride: 92 mEq/L — ABNORMAL LOW (ref 96–112)
Creatinine, Ser: 0.74 mg/dL (ref 0.50–1.35)
GFR calc Af Amer: >90 mL/min (ref >90)
GFR calc non Af Amer: >90 mL/min (ref >90)
Total Bilirubin: 3.4 mg/dL — ABNORMAL HIGH (ref 0.3–1.2)

## 2012-10-12 LAB — URINE MICROSCOPIC-ADD ON

## 2012-10-12 LAB — MRSA PCR SCREENING: MRSA by PCR: NEGATIVE

## 2012-10-12 LAB — AMMONIA: Ammonia: 156 umol/L — ABNORMAL HIGH (ref 11–60)

## 2012-10-12 MED ORDER — LEVOTHYROXINE SODIUM 100 MCG IV SOLR
37.5000 ug | Freq: Every day | INTRAVENOUS | Status: DC
  Administered 2012-10-13: 37.5 ug via INTRAVENOUS
  Filled 2012-10-12: qty 5

## 2012-10-12 MED ORDER — ALUM & MAG HYDROXIDE-SIMETH 200-200-20 MG/5ML PO SUSP: 30.0000 mL | Freq: Four times a day (QID) | ORAL | Status: DC | PRN

## 2012-10-12 MED ORDER — INSULIN GLARGINE 100 UNIT/ML ~~LOC~~ SOLN
20.0000 [IU] | Freq: Every day | SUBCUTANEOUS | Status: DC
  Administered 2012-10-12 – 2012-10-16 (×5): 20 [IU] via SUBCUTANEOUS
  Filled 2012-10-12 (×6): qty 0.2

## 2012-10-12 MED ORDER — OXYCODONE HCL 5 MG PO TABS
5.0000 mg | ORAL_TABLET | ORAL | Status: DC | PRN
  Administered 2012-10-12 – 2012-10-16 (×11): 5 mg via ORAL
  Filled 2012-10-12 (×14): qty 1

## 2012-10-12 MED ORDER — POTASSIUM CHLORIDE CRYS ER 20 MEQ PO TBCR
20.0000 meq | EXTENDED_RELEASE_TABLET | Freq: Three times a day (TID) | ORAL | Status: DC
  Administered 2012-10-12 – 2012-10-17 (×16): 20 meq via ORAL
  Filled 2012-10-12 (×17): qty 1

## 2012-10-12 MED ORDER — SODIUM CHLORIDE 0.9 % IV SOLN
500.0000 mg | Freq: Two times a day (BID) | INTRAVENOUS | Status: DC
  Administered 2012-10-12 – 2012-10-13 (×2): 500 mg via INTRAVENOUS
  Filled 2012-10-12 (×2): qty 5

## 2012-10-12 MED ORDER — DEXTROSE 5 % IV SOLN
3.0000 g | Freq: Once | INTRAVENOUS | Status: AC
  Administered 2012-10-12: 3 g via INTRAVENOUS
  Filled 2012-10-12: qty 6

## 2012-10-12 MED ORDER — SPIRONOLACTONE 100 MG PO TABS
100.0000 mg | ORAL_TABLET | Freq: Every day | ORAL | Status: DC
  Administered 2012-10-12 – 2012-10-13 (×2): 100 mg via ORAL
  Filled 2012-10-12 (×2): qty 1

## 2012-10-12 MED ORDER — RIFAXIMIN 550 MG PO TABS
550.0000 mg | ORAL_TABLET | Freq: Two times a day (BID) | ORAL | Status: DC
  Administered 2012-10-12 – 2012-10-17 (×10): 550 mg via ORAL
  Filled 2012-10-12 (×11): qty 1

## 2012-10-12 MED ORDER — LACTULOSE ENEMA
300.0000 mL | Freq: Two times a day (BID) | ORAL | Status: DC
  Filled 2012-10-12 (×5): qty 300

## 2012-10-12 MED ORDER — RISPERIDONE 0.25 MG PO TABS
0.2500 mg | ORAL_TABLET | Freq: Two times a day (BID) | ORAL | Status: DC
  Administered 2012-10-12 – 2012-10-17 (×10): 0.25 mg via ORAL
  Filled 2012-10-12 (×11): qty 1

## 2012-10-12 MED ORDER — ONDANSETRON HCL 4 MG PO TABS
4.0000 mg | ORAL_TABLET | Freq: Four times a day (QID) | ORAL | Status: DC | PRN
  Administered 2012-10-12: 4 mg via ORAL
  Filled 2012-10-12: qty 1

## 2012-10-12 MED ORDER — SODIUM CHLORIDE 0.9 % IJ SOLN: 3.0000 mL | INTRAMUSCULAR | Status: DC | PRN

## 2012-10-12 MED ORDER — ONDANSETRON HCL 4 MG/2ML IJ SOLN
4.0000 mg | Freq: Four times a day (QID) | INTRAMUSCULAR | Status: DC | PRN
  Filled 2012-10-12: qty 2

## 2012-10-12 MED ORDER — LACTULOSE ENEMA
300.0000 mL | Freq: Once | ORAL | Status: AC
  Administered 2012-10-12: 300 mL via RECTAL
  Filled 2012-10-12: qty 300

## 2012-10-12 MED ORDER — SODIUM CHLORIDE 0.9 % IV SOLN: 250.0000 mL | INTRAVENOUS | Status: DC | PRN

## 2012-10-12 MED ORDER — VITAMINS A & D EX OINT
TOPICAL_OINTMENT | CUTANEOUS | Status: AC
  Administered 2012-10-12: 18:00:00
  Filled 2012-10-12: qty 5

## 2012-10-12 MED ORDER — FUROSEMIDE 10 MG/ML IJ SOLN
80.0000 mg | Freq: Two times a day (BID) | INTRAMUSCULAR | Status: DC
  Administered 2012-10-12 – 2012-10-13 (×2): 80 mg via INTRAVENOUS
  Filled 2012-10-12 (×4): qty 8

## 2012-10-12 MED ORDER — FOLIC ACID 1 MG PO TABS
1.0000 mg | ORAL_TABLET | Freq: Every day | ORAL | Status: DC
  Administered 2012-10-12 – 2012-10-17 (×6): 1 mg via ORAL
  Filled 2012-10-12 (×6): qty 1

## 2012-10-12 MED ORDER — INSULIN ASPART 100 UNIT/ML ~~LOC~~ SOLN
0.0000 [IU] | Freq: Three times a day (TID) | SUBCUTANEOUS | Status: DC
  Administered 2012-10-12: 2 [IU] via SUBCUTANEOUS
  Administered 2012-10-13 – 2012-10-14 (×2): 3 [IU] via SUBCUTANEOUS
  Administered 2012-10-14 (×2): 5 [IU] via SUBCUTANEOUS
  Administered 2012-10-15: 2 [IU] via SUBCUTANEOUS
  Administered 2012-10-15: 5 [IU] via SUBCUTANEOUS
  Administered 2012-10-15: 3 [IU] via SUBCUTANEOUS
  Administered 2012-10-16: 8 [IU] via SUBCUTANEOUS
  Administered 2012-10-16: 3 [IU] via SUBCUTANEOUS
  Administered 2012-10-16: 2 [IU] via SUBCUTANEOUS
  Administered 2012-10-17: 5 [IU] via SUBCUTANEOUS
  Administered 2012-10-17: 3 [IU] via SUBCUTANEOUS

## 2012-10-12 MED ORDER — SODIUM CHLORIDE 0.9 % IJ SOLN
3.0000 mL | Freq: Two times a day (BID) | INTRAMUSCULAR | Status: DC
Start: 2012-10-12 — End: 2012-10-14
  Administered 2012-10-12 – 2012-10-13 (×2): 3 mL via INTRAVENOUS

## 2012-10-12 MED ORDER — PANTOPRAZOLE SODIUM 40 MG IV SOLR
40.0000 mg | INTRAVENOUS | Status: DC
  Administered 2012-10-13 – 2012-10-14 (×2): 40 mg via INTRAVENOUS
  Filled 2012-10-12 (×2): qty 40

## 2012-10-12 MED ORDER — FERROUS SULFATE 325 (65 FE) MG PO TABS
325.0000 mg | ORAL_TABLET | Freq: Every day | ORAL | Status: DC
  Administered 2012-10-12 – 2012-10-16 (×5): 325 mg via ORAL
  Filled 2012-10-12 (×6): qty 1

## 2012-10-12 MED ORDER — SODIUM CHLORIDE 0.9 % IJ SOLN: 3.0000 mL | Freq: Two times a day (BID) | INTRAMUSCULAR | Status: DC

## 2012-10-12 NOTE — ED Notes (Signed)
Per EMS patient from [facility]. Patient was feeding himself and talking yesterday and acting 'normal'. Today the patient is lethargic and not as verbally responsive. Does follow commands. Has generalized weakness. 132/78, 90, cbg 187.  Patient has generalized edema.

## 2012-10-12 NOTE — ED Notes (Signed)
Bed: WU98 Expected date:  Expected time:  Means of arrival:  Comments: Liver failure

## 2012-10-12 NOTE — ED Notes (Signed)
Testicles continue to weep

## 2012-10-12 NOTE — ED Notes (Signed)
Patient has generalized edema (+4). Pulses are palpable. Patient follows commands but has incomprehensible speech a lot of the time. He realizes that he does not make sense at times

## 2012-10-12 NOTE — ED Notes (Signed)
MD Silverio Lay made aware of platelet count

## 2012-10-12 NOTE — H&P (Signed)
[facility] History and Physical  Tony Boyd UJW:[id number] DOB: [birth year] DOA: 10/12/2012  Referring physician: Dr Silverio Lay PCP: [name], DO  Specialists:   Chief Complaint: AMS  HPI: Tony Boyd is a 57 y.o. male was history of cirrhosis (MELD 31) secondary to hepatitis C and alcohol abuse, diabetes, esophageal varices, bipolar disorder who was recently hospitalized from 10/06/12-10/10/12 for hepatic encephalopathy, generalized anasarca with scrotal swelling, hyponatremia who presents from nursing facility with one-day history of left artery, diffuse weakness, increased confusion. Per ED physician the day prior to admission patient was eating his food by himself however on the day of admission he was noted to be more confused and disoriented. Patient is unable to participate in the history due to confusion. Patient is seen in the ED chest x-ray obtained consistent with pulmonary edema. CBC has a hemoglobin of 11.2 platelet count of 28. Comprehensive metabolic profile with a sodium of 129 chloride of 92 albumin of 1.8 alk phosphatase of 233 AST of 42 ALT of 26 bilirubin of 3.4. Lactic acid of 2.05.   ammonia level was noted to be elevated at 156. We were called to admit the patient for further evaluation and management.  Review of Systems: Unable to assess secondary to altered mental status.   Past Medical History  Diagnosis Date  . Cirrhosis of liver   . Hepatitis C   . ETOH abuse   . Hypothyroid   . Psychosis   . Bipolar disorder   . Seizures   . Arthritis   . Back pain   . Coagulopathy     Hx of  . Thrombocytopenia     Hx of  . Pancytopenia     Hx of  . H/O hypokalemia   . Hyponatremia     Hx of  . Korsakoff psychosis   . H/O abdominal abscess   . H/O esophageal varices   . Metabolic encephalopathy   . Hepatic encephalopathy   . Urinary urgency   . H/O renal failure   . H/O ascites   . Altered mental status   . Anemia   . Ascites   . Shortness of breath    . Peripheral vascular disease   . GERD (gastroesophageal reflux disease)   . Thrombocytopenia, acquired 06/02/2012  . Unspecified hypothyroidism 06/02/2012  . Pain in joint, pelvic region and thigh   . Edema   . Obesity, unspecified   . Chest pain, unspecified   . Pneumonitis due to inhalation of food or vomitus   . Other convulsions   . Cellulitis and abscess of upper arm and forearm   . Chronic hepatitis C without mention of hepatic coma   . Hypopotassemia   . Anemia, unspecified   . Other and unspecified coagulation defects   . Thrombocytopenia, unspecified   . Bipolar I disorder, most recent episode (or current) unspecified   . Unspecified psychosis   . Encephalopathy   . Esophageal varices without mention of bleeding   . Esophagitis, unspecified   . Abscess of liver(572.0)   . Chronic kidney disease, unspecified   . Lumbago   . Personal history of alcoholism   . Personal history of tobacco use, presenting hazards to health   . Cirrhosis of liver without mention of alcohol   . Pneumonitis due to inhalation of food or vomitus 09/19/2010  . Chronic hepatitis C without mention of hepatic coma   . Bipolar I disorder, most recent episode (or current) unspecified   . Unspecified psychosis   .  Other ascites   . Personal history of tobacco use, presenting hazards to health   . Cirrhosis of liver without mention of alcohol   . Type 2 diabetes mellitus with diabetic neuropathy    Past Surgical History  Procedure Laterality Date  . Colostomy    . Cholecystectomy    . Small intestine surgery    . Arm surgery      left arm  . US guided drain placement in ventral hernia abscess  07/21/2010  . Multiple picc line placements    . Esophagogastroduodenoscopy  06/28/2011    Procedure: ESOPHAGOGASTRODUODENOSCOPY (EGD);  Surgeon: Louis Meckel, MD;  Location: [facility];  Service: Endoscopy;  Laterality: N/A;  . Esophagogastroduodenoscopy N/A 05/06/2012    Procedure:  ESOPHAGOGASTRODUODENOSCOPY (EGD);  Surgeon: Louis Meckel, MD;  Location: [facility];  Service: Endoscopy;  Laterality: N/A;  . Esophageal banding N/A 05/06/2012    Procedure: ESOPHAGEAL BANDING;  Surgeon: Louis Meckel, MD;  Location: [facility];  Service: Endoscopy;  Laterality: N/A;   Social History:  reports that he quit smoking about 4 years ago. His smoking use included Cigarettes. He smoked 0.00 packs per day. He has never used smokeless tobacco. He reports that he does not drink alcohol or use illicit drugs.  Allergies  Allergen Reactions  . Ativan [Lorazepam] Other (See Comments)    "hallucinations"  . Droperidol Other (See Comments)    unknown  . Ketorolac Other (See Comments)    unknown  . Penicillins Swelling and Other (See Comments)    unkown  . Toradol [Ketorolac Tromethamine] Hives    Family History  Problem Relation Age of Onset  . Heart disease Mother     MI  . Lung cancer Brother      Prior to Admission medications   Medication Sig Start Date End Date Taking? Authorizing Provider  ALPRAZolam Prudy Feeler) 1 MG tablet Take 1 mg by mouth at bedtime as needed for sleep.   Yes Historical Provider, MD  bisacodyl (DULCOLAX) 10 MG suppository Place 10 mg rectally as needed for constipation.   Yes Historical Provider, MD  ferrous sulfate 325 (65 FE) MG tablet Take 325 mg by mouth at bedtime.   Yes Historical Provider, MD  folic acid (FOLVITE) 1 MG tablet Take 1 mg by mouth daily.   Yes Historical Provider, MD  furosemide (LASIX) 80 MG tablet Take 160 mg by mouth daily.   Yes Historical Provider, MD  insulin aspart (NOVOLOG) 100 UNIT/ML injection Inject 6 Units into the skin 3 (three) times daily with meals.   Yes Historical Provider, MD  insulin glargine (LANTUS) 100 UNIT/ML injection Inject 20 Units into the skin at bedtime.   Yes Historical Provider, MD  lactulose (CHRONULAC) 10 GM/15ML solution Take 30 g by mouth 5 (five) times daily.   Yes Historical Provider, MD   levETIRAcetam (KEPPRA) 500 MG tablet Take 500 mg by mouth 2 (two) times daily.   Yes Historical Provider, MD  levothyroxine (SYNTHROID, LEVOTHROID) 75 MCG tablet Take 75 mcg by mouth daily.   Yes Historical Provider, MD  omeprazole (PRILOSEC) 20 MG capsule Take 20 mg by mouth daily.   Yes Historical Provider, MD  Oxycodone HCl 10 MG TABS Take 10 mg by mouth every 4 (four) hours. Hold for sedation 10/10/12  Yes Maryruth Bun Rama, MD  polyethylene glycol (MIRALAX / GLYCOLAX) packet Take 17 g by mouth daily.   Yes Historical Provider, MD  potassium chloride SA (K-DUR,KLOR-CON) 20 MEQ tablet Take 20 mEq  by mouth 3 (three) times daily.   Yes Historical Provider, MD  rifaximin (XIFAXAN) 550 MG TABS Take 550 mg by mouth 2 (two) times daily. 01/26/12  Yes Alison Murray, MD  risperiDONE (RISPERDAL) 0.25 MG tablet Take 0.25 mg by mouth 2 (two) times daily.   Yes Historical Provider, MD  spironolactone (ALDACTONE) 100 MG tablet Take 100 mg by mouth daily.   Yes Historical Provider, MD   Physical Exam: Filed Vitals:   10/12/12 1300  BP: 105/56  Pulse: 101  Resp: 10     General:  Obese, well-developed, thin on gurney disoriented and confused in no acute cardiopulmonary distress.   Eyes: Pupils equal round and reactive to light and accommodation. Extraocular movements intact.   ENT: Clear, no lesions, no exudates.   Neck: Supple with no lymphadenopathy.   Cardiovascular: Regular rate rhythm no murmurs rubs or gallops. Distant heart sounds.   Respiratory: Diffuse crackles.   Abdomen: Soft, obese, positive bowel sounds, some diffuse tenderness to palpation.   Skin: Areas of ecchymosis noted.   Musculoskeletal: Moving extremities spontaneously.   Psychiatric: Disoriented. Confused. Poor judgment. Poor insight.   Neurologic: Confused, disoriented. Moving extremities spontaneously. Unable to assess the rest of the cranial nerve examination and neurological exam secondary to altered mental status.    Labs on Admission:  Basic Metabolic Panel:  Recent Labs Lab 10/06/12 1240 10/06/12 1253 10/06/12 1630 10/07/12 0355 10/08/12 0655 10/10/12 0355 10/12/12 1230  NA 134* 138  --  135 134* 127* 129*  K 3.2* 3.0*  --  2.9* 3.1* 3.7 3.7  CL 99 96  --  99 95* 92* 92*  CO2 32  --   --  34* 35* 34* 30  GLUCOSE 133* 128*  --  167* 164* 151* 198*  BUN 8 6  --  8 7 8 14   CREATININE 0.65 0.80  --  0.72 0.69 0.76 0.74  CALCIUM 8.0*  --   --  8.0* 7.7* 7.8* 8.0*  MG  --   --  1.5  --   --   --   --    Liver Function Tests:  Recent Labs Lab 10/06/12 1240 10/07/12 0355 10/12/12 1230  AST 72* 61* 42*  ALT 35 32 26  ALKPHOS 185* 185* 233*  BILITOT 3.8* 3.0* 3.4*  PROT 5.5* 5.1* 5.8*  ALBUMIN 1.9* 1.8* 1.8*    Recent Labs Lab 10/06/12 1240  LIPASE 33    Recent Labs Lab 10/06/12 1240 10/12/12 1230  AMMONIA 80* 156*   CBC:  Recent Labs Lab 10/06/12 1240 10/06/12 1253 10/08/12 0655 10/12/12 1230  WBC 4.5  --  3.4* 6.9  NEUTROABS 3.4  --   --  5.3  HGB 10.4* 9.9* 9.8* 11.2*  HCT 30.2* 29.0* 28.6* 32.2*  MCV 104.5*  --  106.3* 103.5*  PLT 46*  --  35* 28*   Cardiac Enzymes: No results found for this basename: CKTOTAL, CKMB, CKMBINDEX, TROPONINI,  in the last 168 hours  BNP (last 3 results) No results found for this basename: PROBNP,  in the last 8760 hours CBG:  Recent Labs Lab 10/09/12 1200 10/09/12 1740 10/09/12 2131 10/10/12 0723 10/10/12 1134  GLUCAP 234* 214* 202* 161* 194*    Radiological Exams on Admission: Dg Chest Portable 1 View  10/12/2012   *RADIOLOGY REPORT*  Clinical Data: Altered mental status, shortness of breath  PORTABLE CHEST - 1 VIEW  Comparison: 08/21/2012 and 09/16/2012  Findings: Borderline cardiomegaly noted.  There is central  vascular congestion.  Mild perihilar interstitial prominence bilaterally suspicious for mild interstitial edema.  No segmental infiltrate.  IMPRESSION: Central vascular congestion and mild interstitial  prominence centrally suspicious for mild interstitial edema.  No segmental infiltrate.   Original Report Authenticated By: Natasha Mead, M.D.    EKG: None   Assessment/Plan Principal Problem:   Hepatic encephalopathy Active Problems:   ETOH abuse   Hepatitis C   Coagulopathy   Hyponatremia   Cirrhosis   Esophageal varices without mention of bleeding   Seizure disorder   Thrombocytopenia, acquired   Hypothyroidism   Diabetes mellitus   Anasarca   Scrotal swelling   Anemia of chronic disease   Moderate protein-calorie malnutrition   Weakness generalized  #1 acute hepatic encephalopathy Patient does have a history of cirrhosis secondary to hepatitis C and alcohol abuse recently discharged with acute hepatic encephalopathy presented to the emergency room from a nursing facility with altered mental status and disorientation. Patient is confused on exam. Ammonia level is not stated to be elevated at 156. Patient moving all extremities spontaneously. No neurological deficits noted. Chest x-ray is negative for any acute infiltrate. Urinalysis is not consistent with a UTI. Patient is afebrile. Patient does have a normal white count. Will admit the patient to telemetry. We'll place on lactulose enemas twice daily. Continue rifaximin.  Diuretics. If no significant improvement may consider imaging of the head.  #2 hyponatremia Likely secondary to hypervolemic hyponatremia secondary to cirrhosis. Check a urine sodium. Check a urine creatinine. Check a serum osmolality. Check a urine osmolality. Chest x-ray consistent with pulmonary edema. Patient with recent TSH done which was within normal limits. Diuresis with IV Lasix. Follow.  #3 cirrhosis/hepatitis C/history of esophageal varices Patient with history of cirrhosis felt to be secondary to hepatitis C and alcohol abuse. Patient presented with hepatic encephalopathy. LFTs are slightly elevated. Check a magnesium level. Patient appears volume  overloaded. Check abdominal ultrasound to rule out ascites. Place on IV Lasix 80 mg every 12 hours. Continue spironolactone. Continue Xifaxan. Will place on lactulose enemas. May consider Inderal secondary to hx of esophageal varices. Follow.  #4 anasarca/scrotal swelling Likely secondary to cirrhosis. Chest x-ray consistent with pulmonary edema. Will place patient on Lasix 80 mg IV every 12 hours. Continue spironolactone. May need some albumin. Will follow.  #5 thrombocytopenia Chronic in nature. Likely secondary to cirrhosis. Monitor platelet counts. Follow. Transfuse if platelet counts less than 10,000 or active bleeding.  #6 diabetes mellitus Hemoglobin A1c was 5.3 on 10/06/2012. Will place on sliding scale insulin.  #7 hypothyroidism TSH within normal limits of 2.243 on 09/17/2012. Place on IV Synthroid until patient is more alert and resume oral regimen thereafter.  #8 seizure disorder Will place on IV Keppra until patient is more alert.  #9 protein calorie malnutrition Secondary to cirrhosis. Follow.  #10 macrocytic anemia Likely secondary to history of alcohol abuse. No overt GI bleed. Follow H&H.  #11 prophylaxis PPI for GI prophylaxis. SCDs for DVT prophylaxis.   Code Status: Limited code. No CPR, no cardioversion, no intubation  Family Communication: No family at bedside.  Disposition Plan: Admit to telemetry.   Time spent: 70 mins  [facility] Pager [phone]  If 7PM-7AM, please contact night-coverage [email or web address] Password [facility] 10/12/2012, 2:17 PM

## 2012-10-12 NOTE — ED Provider Notes (Addendum)
CSN: [id number]     Arrival date & time 10/12/12  1033 History     First MD Initiated Contact with Patient 10/12/12 1039     Chief Complaint  Patient presents with  . Altered Mental Status   (Consider location/radiation/quality/duration/timing/severity/associated sxs/prior Treatment) The history is provided by the patient and the nursing home. The history is limited by the condition of the patient.  Tony Boyd is a 57 y.o. male hx of hep C cirrhosis, bipolar, hepatic encephalopathy, here with altered mental status. He was recently admitted for hepatic encephalopathy as well as anasarca and was placed on lactulose and sent to the nursing home 2 days ago. Yesterday he was eating food by himself but today the nursing home noted that he was more confused and disoriented. He was unable to be by himself today and has diffuse weakness. Patient is unable to give much history and whenever I asked him any questions he repeatedly says "[facility]".    Level V caveat- AMS   Past Medical History  Diagnosis Date  . Cirrhosis of liver   . Hepatitis C   . ETOH abuse   . Hypothyroid   . Psychosis   . Bipolar disorder   . Seizures   . Arthritis   . Back pain   . Coagulopathy     Hx of  . Thrombocytopenia     Hx of  . Pancytopenia     Hx of  . H/O hypokalemia   . Hyponatremia     Hx of  . Korsakoff psychosis   . H/O abdominal abscess   . H/O esophageal varices   . Metabolic encephalopathy   . Hepatic encephalopathy   . Urinary urgency   . H/O renal failure   . H/O ascites   . Altered mental status   . Anemia   . Ascites   . Shortness of breath   . Peripheral vascular disease   . GERD (gastroesophageal reflux disease)   . Thrombocytopenia, acquired 06/02/2012  . Unspecified hypothyroidism 06/02/2012  . Pain in joint, pelvic region and thigh   . Edema   . Obesity, unspecified   . Chest pain, unspecified   . Pneumonitis due to inhalation of food or vomitus   . Other convulsions    . Cellulitis and abscess of upper arm and forearm   . Chronic hepatitis C without mention of hepatic coma   . Hypopotassemia   . Anemia, unspecified   . Other and unspecified coagulation defects   . Thrombocytopenia, unspecified   . Bipolar I disorder, most recent episode (or current) unspecified   . Unspecified psychosis   . Encephalopathy   . Esophageal varices without mention of bleeding   . Esophagitis, unspecified   . Abscess of liver(572.0)   . Chronic kidney disease, unspecified   . Lumbago   . Personal history of alcoholism   . Personal history of tobacco use, presenting hazards to health   . Cirrhosis of liver without mention of alcohol   . Pneumonitis due to inhalation of food or vomitus 09/19/2010  . Chronic hepatitis C without mention of hepatic coma   . Bipolar I disorder, most recent episode (or current) unspecified   . Unspecified psychosis   . Other ascites   . Personal history of tobacco use, presenting hazards to health   . Cirrhosis of liver without mention of alcohol   . Type 2 diabetes mellitus with diabetic neuropathy    Past Surgical History  Procedure Laterality  Date  . Colostomy    . Cholecystectomy    . Small intestine surgery    . Arm surgery      left arm  . US guided drain placement in ventral hernia abscess  07/21/2010  . Multiple picc line placements    . Esophagogastroduodenoscopy  06/28/2011    Procedure: ESOPHAGOGASTRODUODENOSCOPY (EGD);  Surgeon: Louis Meckel, MD;  Location: [facility];  Service: Endoscopy;  Laterality: N/A;  . Esophagogastroduodenoscopy N/A 05/06/2012    Procedure: ESOPHAGOGASTRODUODENOSCOPY (EGD);  Surgeon: Louis Meckel, MD;  Location: [facility];  Service: Endoscopy;  Laterality: N/A;  . Esophageal banding N/A 05/06/2012    Procedure: ESOPHAGEAL BANDING;  Surgeon: Louis Meckel, MD;  Location: [facility];  Service: Endoscopy;  Laterality: N/A;   Family History  Problem Relation Age of Onset  . Heart disease  Mother     MI  . Lung cancer Brother    History  Substance Use Topics  . Smoking status: Former Smoker    Types: Cigarettes    Quit date: 02/15/2008  . Smokeless tobacco: Never Used  . Alcohol Use: No    Review of Systems  Unable to perform ROS: Mental status change    Allergies  Ativan; Droperidol; Ketorolac; Penicillins; and Toradol  Home Medications   Current Outpatient Rx  Name  Route  Sig  Dispense  Refill  . ALPRAZolam (XANAX) 1 MG tablet   Oral   Take 1 mg by mouth at bedtime as needed for sleep.         . bisacodyl (DULCOLAX) 10 MG suppository   Rectal   Place 10 mg rectally as needed for constipation.         . ferrous sulfate 325 (65 FE) MG tablet   Oral   Take 325 mg by mouth at bedtime.         . folic acid (FOLVITE) 1 MG tablet   Oral   Take 1 mg by mouth daily.         . furosemide (LASIX) 80 MG tablet   Oral   Take 160 mg by mouth daily.         . insulin aspart (NOVOLOG) 100 UNIT/ML injection   Subcutaneous   Inject 6 Units into the skin 3 (three) times daily with meals.         . insulin glargine (LANTUS) 100 UNIT/ML injection   Subcutaneous   Inject 20 Units into the skin at bedtime.         [name] lactulose (CHRONULAC) 10 GM/15ML solution   Oral   Take 30 g by mouth 5 (five) times daily.         [name] levETIRAcetam (KEPPRA) 500 MG tablet   Oral   Take 500 mg by mouth 2 (two) times daily.         [name] levothyroxine (SYNTHROID, LEVOTHROID) 75 MCG tablet   Oral   Take 75 mcg by mouth daily.         [name] omeprazole (PRILOSEC) 20 MG capsule   Oral   Take 20 mg by mouth daily.         . Oxycodone HCl 10 MG TABS   Oral   Take 10 mg by mouth every 4 (four) hours. Hold for sedation         . polyethylene glycol (MIRALAX / GLYCOLAX) packet   Oral   Take 17 g by mouth daily.         . potassium chloride  SA (K-DUR,KLOR-CON) 20 MEQ tablet   Oral   Take 20 mEq by mouth 3 (three) times daily.         . rifaximin  (XIFAXAN) 550 MG TABS   Oral   Take 550 mg by mouth 2 (two) times daily.         . risperiDONE (RISPERDAL) 0.25 MG tablet   Oral   Take 0.25 mg by mouth 2 (two) times daily.         [name] spironolactone (ALDACTONE) 100 MG tablet   Oral   Take 100 mg by mouth daily.          BP 133/70  Pulse 105  Resp 15  SpO2 98% Physical Exam  Nursing note and vitals reviewed. Constitutional:  Chronically ill, confused   HENT:  Head: Normocephalic.  Mouth/Throat: Oropharynx is clear and moist.  Eyes: Pupils are equal, round, and reactive to light.  Neck: Normal range of motion. Neck supple.  Cardiovascular: Normal rate, regular rhythm and normal heart sounds.   Pulmonary/Chest:  Decreased breath sounds likely from poor effort   Abdominal: Soft.  + diffuse edema. No obvious fluid wave. Nontender.   Genitourinary:  Scrotum swollen and slightly red (had this 2 weeks ago with [place] confirming 3rd spacing). Indwelling foley   Musculoskeletal:  Moving all extremities   Neurological:  Confused, disoriented. Able to follow simple commands. Moving all extremities   Skin: Skin is warm and dry.  Psychiatric:  Unable     ED Course   Procedures (including critical care time)  Angiocath insertion Performed by: Silverio Lay, Waniya Hoglund  Consent: Verbal consent obtained. Risks and benefits: risks, benefits and alternatives were discussed Time out: Immediately prior to procedure a "time out" was called to verify the correct patient, procedure, equipment, support staff and site/side marked as required.  Preparation: Patient was prepped and draped in the usual sterile fashion.  Vein Location: R antecube  Ultrasound Guided  Gauge: 20   Normal blood return and flush without difficulty Patient tolerance: Patient tolerated the procedure well with no immediate complications.     Labs Reviewed  CBC WITH DIFFERENTIAL - Abnormal; Notable for the following:    RBC 3.11 (*)    Hemoglobin 11.2 (*)    HCT  32.2 (*)    MCV 103.5 (*)    MCH 36.0 (*)    RDW 17.1 (*)    Platelets 28 (*)    Lymphocytes Relative 11 (*)    All other components within normal limits  COMPREHENSIVE METABOLIC PANEL - Abnormal; Notable for the following:    Sodium 129 (*)    Chloride 92 (*)    Glucose, Bld 198 (*)    Calcium 8.0 (*)    Total Protein 5.8 (*)    Albumin 1.8 (*)    AST 42 (*)    Alkaline Phosphatase 233 (*)    Total Bilirubin 3.4 (*)    All other components within normal limits  AMMONIA - Abnormal; Notable for the following:    Ammonia 156 (*)    All other components within normal limits  URINALYSIS, ROUTINE W REFLEX MICROSCOPIC - Abnormal; Notable for the following:    Hgb urine dipstick MODERATE (*)    Urobilinogen, UA 2.0 (*)    Leukocytes, UA SMALL (*)    All other components within normal limits  PROTIME-INR - Abnormal; Notable for the following:    Prothrombin Time 20.3 (*)    INR 1.79 (*)    All other components  within normal limits  URINE CULTURE  URINE MICROSCOPIC-ADD ON  CG4 I-STAT (LACTIC ACID)   Dg Chest Portable 1 View  10/12/2012   *RADIOLOGY REPORT*  Clinical Data: Altered mental status, shortness of breath  PORTABLE CHEST - 1 VIEW  Comparison: 08/21/2012 and 09/16/2012  Findings: Borderline cardiomegaly noted.  There is central vascular congestion.  Mild perihilar interstitial prominence bilaterally suspicious for mild interstitial edema.  No segmental infiltrate.  IMPRESSION: Central vascular congestion and mild interstitial prominence centrally suspicious for mild interstitial edema.  No segmental infiltrate.   Original Report Authenticated By: Natasha Mead, M.D.   No diagnosis found.  MDM  Eustacio Ellen is a 57 y.o. male here with AMS. Likely hepatic encephalopathy vs infections. Will get labs and ammonia and cxr and UA. Will likely need admission again.   1:42 PM Labs showed ammonia 156. UA unremarkable. CXR showed mild interstitial edema consistent with his anasarca. I  think he has hepatic encephalopathy and I ordered lactulose PR. Will admit to tele under Dr. Janee Morn.    Richardean Canal, MD 10/12/12 1343  Richardean Canal, MD 10/12/12 1430

## 2012-10-12 NOTE — ED Notes (Addendum)
MD Silverio Lay in to draw blood. Unable to obtain via PIV

## 2012-10-13 ENCOUNTER — Other Ambulatory Visit: Payer: Self-pay | Admitting: Geriatric Medicine

## 2012-10-13 ENCOUNTER — Ambulatory Visit: Payer: Medicare Other | Admitting: Pharmacotherapy

## 2012-10-13 ENCOUNTER — Inpatient Hospital Stay (HOSPITAL_COMMUNITY): Payer: Medicare Other

## 2012-10-13 LAB — GLUCOSE, CAPILLARY
Glucose-Capillary: 111 mg/dL — ABNORMAL HIGH (ref 70–99)
Glucose-Capillary: 214 mg/dL — ABNORMAL HIGH (ref 70–99)

## 2012-10-13 LAB — OSMOLALITY: Osmolality: 277 mOsm/kg (ref 275–300)

## 2012-10-13 MED ORDER — LACTULOSE 10 GM/15ML PO SOLN
45.0000 g | Freq: Four times a day (QID) | ORAL | Status: DC
  Administered 2012-10-13 – 2012-10-14 (×7): 45 g via ORAL
  Filled 2012-10-13 (×8): qty 90

## 2012-10-13 MED ORDER — LEVETIRACETAM 500 MG PO TABS
500.0000 mg | ORAL_TABLET | Freq: Two times a day (BID) | ORAL | Status: DC
Start: 2012-10-13 — End: 2012-10-17
  Administered 2012-10-13 – 2012-10-17 (×9): 500 mg via ORAL
  Filled 2012-10-13 (×11): qty 1

## 2012-10-13 MED ORDER — SPIRONOLACTONE 100 MG PO TABS
100.0000 mg | ORAL_TABLET | Freq: Once | ORAL | Status: AC
  Administered 2012-10-13: 100 mg via ORAL
  Filled 2012-10-13: qty 1

## 2012-10-13 MED ORDER — LEVOTHYROXINE SODIUM 75 MCG PO TABS
75.0000 ug | ORAL_TABLET | Freq: Every day | ORAL | Status: DC
  Administered 2012-10-13 – 2012-10-17 (×5): 75 ug via ORAL
  Filled 2012-10-13 (×6): qty 1

## 2012-10-13 MED ORDER — ALPRAZOLAM 1 MG PO TABS
1.0000 mg | ORAL_TABLET | Freq: Every evening | ORAL | Status: DC | PRN
Start: 2012-10-13 — End: 2012-10-14
  Administered 2012-10-13: 1 mg via ORAL
  Filled 2012-10-13: qty 1

## 2012-10-13 MED ORDER — ALPRAZOLAM 1 MG PO TABS: 1.0000 mg | ORAL_TABLET | Freq: Every evening | ORAL | Status: DC | PRN

## 2012-10-13 MED ORDER — OXYCODONE HCL 5 MG PO TABS
10.0000 mg | ORAL_TABLET | ORAL | Status: DC
  Administered 2012-10-13 – 2012-10-17 (×25): 10 mg via ORAL
  Filled 2012-10-13 (×24): qty 2

## 2012-10-13 MED ORDER — FUROSEMIDE 10 MG/ML IJ SOLN
80.0000 mg | Freq: Three times a day (TID) | INTRAMUSCULAR | Status: DC
  Administered 2012-10-13 – 2012-10-14 (×3): 80 mg via INTRAVENOUS
  Filled 2012-10-13 (×4): qty 8

## 2012-10-13 MED ORDER — SPIRONOLACTONE 100 MG PO TABS
200.0000 mg | ORAL_TABLET | Freq: Every day | ORAL | Status: DC
  Administered 2012-10-14 – 2012-10-17 (×4): 200 mg via ORAL
  Filled 2012-10-13 (×4): qty 2

## 2012-10-13 MED ORDER — SODIUM CHLORIDE 0.9 % IJ SOLN
10.0000 mL | INTRAMUSCULAR | Status: DC | PRN
  Administered 2012-10-13: 20 mL
  Administered 2012-10-14 – 2012-10-17 (×6): 10 mL
  Administered 2012-10-17: 20 mL

## 2012-10-13 NOTE — Evaluation (Signed)
Physical Therapy Evaluation Patient Details Name: Tony Boyd MRN: [id number] DOB: January 20, 1956 Today's Date: 10/13/2012 Time: 1191-4782 PT Time Calculation (min): 41 min  PT Assessment / Plan / Recommendation History of Present Illness  Tony Boyd is a 57 y.o. male was history of cirrhosis (MELD 31) secondary to hepatitis C and alcohol abuse, diabetes, esophageal varices, bipolar disorder who was recently hospitalized from 10/06/12-10/10/12 for hepatic encephalopathy, generalized anasarca with scrotal swelling, hyponatremia who presents from nursing facility with one-day history of left artery, diffuse weakness, increased confusion.   Clinical Impression  Pt has significant edema in all 4 extremities and scrotum that has increased in past 4 weeks.  His scrotum has extensive excoriation and pain. Pt is unable to ambulate at this time due to edema and pain.  Recommend WOC nurse consult to collaborate on a type of scrotal support to position scrotum anterior to body to attempt ambulation.  He will benefit from continued PT at SNF    PT Assessment  Patient needs continued PT services    Follow Up Recommendations       Does the patient have the potential to tolerate intense rehabilitation      Barriers to Discharge        Equipment Recommendations  None recommended by PT    Recommendations for Other Services Other (comment) (WOC nurse)   Frequency Min 3X/week    Precautions / Restrictions Precautions Precaution Comments: scrotal swelling and excoration with pain and  bloody drainage (according to  chart, pt has increased 30 kg in weight in ~ one month) Restrictions Weight Bearing Restrictions: No   Pertinent Vitals/Pain Pt with pain in  Scrotum with mobility      Mobility  Bed Mobility Bed Mobility: Rolling Left;Rolling Right;Scooting to HOB Rolling Right: 1: +2 Total assist Rolling Right: Patient Percentage: 50% Rolling Left: 1: +2 Total assist Rolling Left: Patient  Percentage: 50% Scooting to HOB: 1: +2 Total assist Scooting to [facility]: Patient Percentage: 80% Details for Bed Mobility Assistance: Pt limited by pain.  He was only to able to assist with mobility after multiple repetitions of AROM and AAROM of LE's to allow better ROM .  Pt was finally able to  roll to side and scotum was moved forward by lifting on interdry fabic that was positioned around scrotum  so it was not longer pinched between his legs.  Scrotom noted to have multiple area of excoriation and broken skin  Pt with some relief from pain and was able to move better. To scoot   Bed was put into trundellen berg and pt assisted to reach to head of bed and he was able to pull legs up to push and pull with arms to scoot to head of bed Transfers Details for Transfer Assistance: Pt was unable to come to the edge of the bed today due to pain    Exercises General Exercises - Lower Extremity Ankle Circles/Pumps: AAROM;Both;20 reps;Supine Quad Sets: AROM;Both;20 reps;Supine Gluteal Sets: AROM;Strengthening;Both;10 reps Short Arc Quad: AROM;Both;20 reps;Supine Hip ABduction/ADduction: AAROM;Both;20 reps;Supine Hip Flexion/Marching: AAROM;Both;20 reps;Supine Other Exercises Other Exercises: abd sets   PT Diagnosis: Difficulty walking;Acute pain  PT Problem List: Pain;Obesity;Decreased mobility;Decreased activity tolerance PT Treatment Interventions: Gait training;Functional mobility training;Therapeutic activities;Therapeutic exercise;Patient/family education     PT Goals(Current goals can be found in the care plan section) Acute Rehab PT Goals Patient Stated Goal: pt states she plans to get married Sept 15 PT Goal Formulation: With patient Time For Goal Achievement: 10/27/12 Potential to Achieve Goals:  Fair  Visit Information  Last PT Received On: 10/13/12 Assistance Needed: +2 History of Present Illness: Gregroy Boyd is a 57 y.o. male was history of cirrhosis (MELD 31) secondary to  hepatitis C and alcohol abuse, diabetes, esophageal varices, bipolar disorder who was recently hospitalized from 10/06/12-10/10/12 for hepatic encephalopathy, generalized anasarca with scrotal swelling, hyponatremia who presents from nursing facility with one-day history of left artery, diffuse weakness, increased confusion.        Prior Functioning  Home Living Family/patient expects to be discharged to:: Skilled nursing facility Prior Function Level of Independence: Independent with assistive device(s) Comments: 4 wheeled RW  Pt states he has not been able to walk recently due to pain  Communication Communication: No difficulties    Cognition  Cognition Arousal/Alertness: Awake/alert Behavior During Therapy: WFL for tasks assessed/performed Overall Cognitive Status: Within Functional Limits for tasks assessed General Comments: Pt needs much encouragement.  He limits himself due to pain and swelling    Extremity/Trunk Assessment     Balance    End of Session PT - End of Session Activity Tolerance: Patient limited by pain Patient left: in bed;with call bell/phone within reach Nurse Communication: Mobility status  GP   Rosey Bath K. [place],  [phone]  10/13/2012, 12:46 PM

## 2012-10-13 NOTE — Progress Notes (Signed)
Phlebotomist was unable to get enough blood to run lab work ordered for today.

## 2012-10-13 NOTE — Progress Notes (Signed)
OT Cancellation Note  Patient Details Name: Tony Boyd MRN: [id number] DOB: 1955-07-14   Cancelled Treatment:    Reason Eval/Treat Not Completed: Other (comment).  Pt feels too swollen to try to move--will check back tomorrow.   Floriene Jeschke 10/13/2012, 10:34 AM Marica Otter, OTR/L [phone] 10/13/2012

## 2012-10-13 NOTE — Progress Notes (Signed)
[facility] PROGRESS NOTE  Tony Boyd ZOX:[id number] DOB: 1955/09/26 DOA: 10/12/2012 PCP: [name], DO  Assessment/Plan: #1 acute hepatic encephalopathy  Patient does have a history of cirrhosis secondary to hepatitis C and alcohol abuse recently discharged with acute hepatic encephalopathy presented to the emergency room from a nursing facility with altered mental status and disorientation. Patient more alert today and following commands.  Ammonia level  elevated at 156 on admission.. Patient moving all extremities spontaneously. No neurological deficits noted. Chest x-ray is negative for any acute infiltrate. Urinalysis is not consistent with a UTI. Patient is afebrile. Patient does have a normal white count. Change lactulose enemas to oral. Continue Xifaxan. Continue Lasix and spironolactone. Follow.   #2 hyponatremia  Likely secondary to hypervolemic hyponatremia secondary to cirrhosis.  Urine sodium pending. Urine creatinine. Serum osmolality pending. Urine osmolality pending. Chest x-ray consistent with pulmonary edema. Patient with recent TSH done which was within normal limits. Continue diuresis with IV Lasix and spironolactone. Follow.  #3 cirrhosis/hepatitis C/history of esophageal varices  Patient with history of cirrhosis felt to be secondary to hepatitis C and alcohol abuse. Patient presented with hepatic encephalopathy. LFTs are slightly elevated. Labs pending. Patient appears volume overloaded. Abdominal ultrasound with small amount of ascites. Change IV Lasix 80 mg to every 8 hours. Increase spironolactone to 200mg  daily. Continue Xifaxan. Change lactulose enemas to oral. May consider Inderal secondary to hx of esophageal varices. Follow.  #4 anasarca/scrotal swelling  Likely secondary to cirrhosis. Chest x-ray consistent with pulmonary edema. Will increase Lasix 80 mg IV every 8 hours. Increase spironolactone to 200mg  daily. May need some albumin. Will follow.  #5  thrombocytopenia  Chronic in nature. Likely secondary to cirrhosis. Monitor platelet counts. Follow. Transfuse if platelet counts less than 10,000 or active bleeding.  #6 diabetes mellitus  Hemoglobin A1c was 5.3 on 10/06/2012. Continue sliding scale insulin.  #7 hypothyroidism  TSH within normal limits of 2.243 on 09/17/2012. Change IV Synthroid to oral.  #8 seizure disorder  Change IV Keppra to oral. #9 protein calorie malnutrition  Secondary to cirrhosis. Follow.  #10 macrocytic anemia  Likely secondary to history of alcohol abuse. No overt GI bleed. Follow H&H.  #11 prophylaxis  PPI for GI prophylaxis. SCDs for DVT prophylaxis.      Code Status: Partial  Family Communication: Updated patient no family at bedside. Disposition Plan: Skilled nursing facility when medically stable   Consultants:  None  Procedures:  Chest x-ray 10/12/2012  Antibiotics:  None  HPI/Subjective: Patient more alert, ff commands asking for food. C/O chronic pain.  Objective: Filed Vitals:   10/13/12 0450  BP: 110/70  Pulse:   Temp:   Resp:     Intake/Output Summary (Last 24 hours) at 10/13/12 1018 Last data filed at 10/12/12 1900  Gross per 24 hour  Intake    105 ml  Output      0 ml  Net    105 ml   Filed Weights   10/12/12 1547  Weight: 144.5 kg (318 lb 9 oz)    Exam:   General:  NAD. Alert. Edematous.  Cardiovascular: RRR. Distant HS  Respiratory: CTAB anterior lung fields.  Abdomen: Soft/NT/Distended/+BS  Musculoskeletal: Edematous. [employer].  Data Reviewed: Basic Metabolic Panel:  Recent Labs Lab 10/06/12 1240 10/06/12 1253 10/06/12 1630 10/07/12 0355 10/08/12 0655 10/10/12 0355 10/12/12 1230  NA 134* 138  --  135 134* 127* 129*  K 3.2* 3.0*  --  2.9* 3.1* 3.7 3.7  CL 99  96  --  99 95* 92* 92*  CO2 32  --   --  34* 35* 34* 30  GLUCOSE 133* 128*  --  167* 164* 151* 198*  BUN 8 6  --  8 7 8 14   CREATININE 0.65 0.80  --  0.72 0.69 0.76 0.74   CALCIUM 8.0*  --   --  8.0* 7.7* 7.8* 8.0*  MG  --   --  1.5  --   --   --  1.6   Liver Function Tests:  Recent Labs Lab 10/06/12 1240 10/07/12 0355 10/12/12 1230  AST 72* 61* 42*  ALT 35 32 26  ALKPHOS 185* 185* 233*  BILITOT 3.8* 3.0* 3.4*  PROT 5.5* 5.1* 5.8*  ALBUMIN 1.9* 1.8* 1.8*    Recent Labs Lab 10/06/12 1240  LIPASE 33    Recent Labs Lab 10/06/12 1240 10/12/12 1230  AMMONIA 80* 156*   CBC:  Recent Labs Lab 10/06/12 1240 10/06/12 1253 10/08/12 0655 10/12/12 1230  WBC 4.5  --  3.4* 6.9  NEUTROABS 3.4  --   --  5.3  HGB 10.4* 9.9* 9.8* 11.2*  HCT 30.2* 29.0* 28.6* 32.2*  MCV 104.5*  --  106.3* 103.5*  PLT 46*  --  35* 28*   Cardiac Enzymes: No results found for this basename: CKTOTAL, CKMB, CKMBINDEX, TROPONINI,  in the last 168 hours BNP (last 3 results) No results found for this basename: PROBNP,  in the last 8760 hours CBG:  Recent Labs Lab 10/12/12 1703 10/12/12 2120 10/13/12 0011 10/13/12 0358 10/13/12 0751  GLUCAP 148* 162* 154* 135* 118*    Recent Results (from the past 240 hour(s))  MRSA PCR SCREENING     Status: None   Collection Time    10/12/12  3:54 PM      Result Value Range Status   MRSA by PCR NEGATIVE  NEGATIVE Final   Comment:            The GeneXpert MRSA Assay (FDA     approved for NASAL specimens     only), is one component of a     comprehensive MRSA colonization     surveillance program. It is not     intended to diagnose MRSA     infection nor to guide or     monitor treatment for     MRSA infections.     Performed at [facility]     Studies: US Abdomen Complete  10/12/2012   *RADIOLOGY REPORT*  Clinical Data:  Cirrhosis, question ascites.  COMPLETE ABDOMINAL ULTRASOUND  Comparison:  10/06/2012  Findings:  Gallbladder:  Prior cholecystectomy.  Common bile duct:   Normal caliber, 5 mm.  Liver:  Heterogeneous echotexture with nodular contours compatible with history of cirrhosis.  1.5 cm hypodense  area within the right lobe anteriorly, likely small cyst.  IVC:  Appears normal.  Pancreas:  Unable to visualize due to body habitus and overlying bowel gas.  Spleen:  Enlarged with a length of 13.7 cm and a volume of 808 ml. No focal abnormality.  Right Kidney:   Normal in size and parenchymal echogenicity.  No evidence of mass or hydronephrosis.  Left Kidney:  4.1 cm simple appearing cyst off the upper pole.  No hydronephrosis.  Normal echotexture.  Abdominal aorta:  No aneurysm.  Small amount of ascites in the lower abdomen bilaterally  IMPRESSION: Changes of cirrhosis.  Associated splenomegaly with a small amount of pelvic ascites.   Original  Report Authenticated By: Charlett Nose, M.D.   Dg Chest Portable 1 View  10/12/2012   *RADIOLOGY REPORT*  Clinical Data: Altered mental status, shortness of breath  PORTABLE CHEST - 1 VIEW  Comparison: 08/21/2012 and 09/16/2012  Findings: Borderline cardiomegaly noted.  There is central vascular congestion.  Mild perihilar interstitial prominence bilaterally suspicious for mild interstitial edema.  No segmental infiltrate.  IMPRESSION: Central vascular congestion and mild interstitial prominence centrally suspicious for mild interstitial edema.  No segmental infiltrate.   Original Report Authenticated By: Natasha Mead, M.D.    Scheduled Meds: . ferrous sulfate  325 mg Oral QHS  . folic acid  1 mg Oral Daily  . furosemide  80 mg Intravenous Q12H  . insulin aspart  0-15 Units Subcutaneous TID WC  . insulin glargine  20 Units Subcutaneous QHS  . lactulose  45 g Oral QID  . levETIRAcetam  500 mg Oral BID  . levothyroxine  75 mcg Oral Daily  . Oxycodone HCl  10 mg Oral Q4H  . pantoprazole (PROTONIX) IV  40 mg Intravenous Q24H  . potassium chloride SA  20 mEq Oral TID  . rifaximin  550 mg Oral BID  . risperiDONE  0.25 mg Oral BID  . sodium chloride  3 mL Intravenous Q12H  . sodium chloride  3 mL Intravenous Q12H  . spironolactone  100 mg Oral Once  . [START ON  10/14/2012] spironolactone  200 mg Oral Daily   Continuous Infusions:   Principal Problem:   Hepatic encephalopathy Active Problems:   ETOH abuse   Hepatitis C   Coagulopathy   Hyponatremia   Cirrhosis   Esophageal varices without mention of bleeding   Seizure disorder   Thrombocytopenia, acquired   Hypothyroidism   Diabetes mellitus   Anasarca   Scrotal swelling   Anemia of chronic disease   Moderate protein-calorie malnutrition   Weakness generalized    Time spent: > 35 mins    [facility]  [facility] Pager [phone]. If 7PM-7AM, please contact night-coverage at [email or web address], password [facility] 10/13/2012, 10:18 AM  LOS: 1 day

## 2012-10-13 NOTE — Progress Notes (Signed)
Patient refusing to be turned/repositioned/. Refusing enemas and having large liquid yellow/brown stool.  Compliant with requesting bedpan (but is incontinent of stool also) but it is very uncomfortable/painful when patient is moved / repositioned.  Says he wants to just take his pain medication and be left alone. Did allow staff to turn and reposition him with much encouragement.

## 2012-10-13 NOTE — Progress Notes (Signed)
Nutrition Brief Note  Patient identified on the Malnutrition Screening Tool (MST) Report  Wt Readings from Last 15 Encounters:  10/12/12 318 lb 9 oz (144.5 kg)  10/08/12 325 lb 2.9 oz (147.5 kg)  10/02/12 302 lb (136.986 kg)  09/25/12 273 lb (123.832 kg)  09/21/12 252 lb 6.8 oz (114.5 kg)  08/28/12 245 lb 2.4 oz (111.2 kg)  08/15/12 274 lb (124.286 kg)  08/11/12 271 lb 6.2 oz (123.1 kg)  07/31/12 278 lb (126.1 kg)  07/25/12 260 lb (117.935 kg)  06/09/12 275 lb 12.8 oz (125.102 kg)  06/02/12 260 lb 2.3 oz (118 kg)  05/23/12 267 lb 11.2 oz (121.428 kg)  05/07/12 276 lb 3.2 oz (125.283 kg)  05/06/12 280 lb (127.007 kg)    Body mass index is 48.45 kg/(m^2). Patient meets criteria for Morbid Obesity based on current BMI. Pt states he usually weighs 250 lbs (BMI of 38) but, his weight is high now due to edema.   Current diet order is Low Sodium, diet was advanced today. Pt reports having a good appetite and eating normally (3 meals/day) PTA. Labs and medications reviewed.   No nutrition interventions warranted at this time. If nutrition issues arise, please consult RD.   Ian Malkin RD, LDN Inpatient Clinical Dietitian Pager: [phone] After Hours Pager: [phone]

## 2012-10-14 DIAGNOSIS — G40909 Epilepsy, unspecified, not intractable, without status epilepticus: Secondary | ICD-10-CM

## 2012-10-14 LAB — CBC
HCT: 27.3 % — ABNORMAL LOW (ref 39.0–52.0)
Hemoglobin: 9.3 g/dL — ABNORMAL LOW (ref 13.0–17.0)
MCH: 35.8 pg — ABNORMAL HIGH (ref 26.0–34.0)
MCHC: 34.1 g/dL (ref 30.0–36.0)
MCV: 105 fL — ABNORMAL HIGH (ref 78.0–100.0)
RDW: 17.4 % — ABNORMAL HIGH (ref 11.5–15.5)

## 2012-10-14 LAB — COMPREHENSIVE METABOLIC PANEL
ALT: 21 U/L (ref 0–53)
AST: 37 U/L (ref 0–37)
Albumin: 1.6 g/dL — ABNORMAL LOW (ref 3.5–5.2)
Alkaline Phosphatase: 202 U/L — ABNORMAL HIGH (ref 39–117)
Calcium: 7.6 mg/dL — ABNORMAL LOW (ref 8.4–10.5)
Glucose, Bld: 221 mg/dL — ABNORMAL HIGH (ref 70–99)
Potassium: 3.8 mEq/L (ref 3.5–5.1)
Sodium: 129 mEq/L — ABNORMAL LOW (ref 135–145)
Total Protein: 5 g/dL — ABNORMAL LOW (ref 6.0–8.3)

## 2012-10-14 LAB — GLUCOSE, CAPILLARY
Glucose-Capillary: 217 mg/dL — ABNORMAL HIGH (ref 70–99)
Glucose-Capillary: 203 mg/dL — ABNORMAL HIGH (ref 70–99)
Glucose-Capillary: 222 mg/dL — ABNORMAL HIGH (ref 70–99)

## 2012-10-14 MED ORDER — ALPRAZOLAM 1 MG PO TABS
1.0000 mg | ORAL_TABLET | Freq: Two times a day (BID) | ORAL | Status: DC | PRN
  Administered 2012-10-16 – 2012-10-17 (×2): 1 mg via ORAL
  Filled 2012-10-14 (×2): qty 1

## 2012-10-14 MED ORDER — LACTULOSE 10 GM/15ML PO SOLN
50.0000 g | Freq: Three times a day (TID) | ORAL | Status: DC
  Administered 2012-10-14 – 2012-10-17 (×9): 50 g via ORAL
  Filled 2012-10-14 (×10): qty 90

## 2012-10-14 MED ORDER — PANTOPRAZOLE SODIUM 40 MG PO TBEC
40.0000 mg | DELAYED_RELEASE_TABLET | Freq: Every day | ORAL | Status: DC
  Administered 2012-10-15 – 2012-10-17 (×3): 40 mg via ORAL
  Filled 2012-10-14 (×3): qty 1

## 2012-10-14 MED ORDER — FUROSEMIDE 10 MG/ML IJ SOLN
80.0000 mg | Freq: Four times a day (QID) | INTRAMUSCULAR | Status: DC
  Administered 2012-10-14 – 2012-10-17 (×13): 80 mg via INTRAVENOUS
  Filled 2012-10-14 (×16): qty 8

## 2012-10-14 NOTE — Progress Notes (Signed)
Clinical Social Work Department BRIEF PSYCHOSOCIAL ASSESSMENT 10/14/2012  Patient:  Tony Boyd, Tony Boyd     Account Number:  [id number]     Admit date:  10/12/2012  Clinical Social Worker:  Orpah Greek  Date/Time:  10/14/2012 02:58 PM  Referred by:  Physician  Date Referred:  10/14/2012 Referred for  Other - See comment   Other Referral:   Admitted from: [facility]   Interview type:  Patient Other interview type:   and SNF hospital Liasion, Tammy    PSYCHOSOCIAL DATA Living Status:  FACILITY Admitted from facility:  [facility] Level of care:  Skilled Nursing Facility Primary support name:  [name] (daughter) ph#: [phone] Primary support relationship to patient:  CHILD, ADULT Degree of support available:   good    CURRENT CONCERNS Current Concerns  Post-Acute Placement   Other Concerns:    SOCIAL WORK ASSESSMENT / PLAN CSW received consult that patient was admitted from [facility] SNF (which he was just discharged to on Friday, 8/15) and readmitted on Sunday, 8/17.   Assessment/plan status:  Information/Referral to [facility] Other assessment/ plan:   Information/referral to community resources:   CSW completed FL2 and faxed information to [facility] SNF, confirmed with Tammy that they would be able to take patient back when stable.    PATIENT'S/FAMILY'S RESPONSE TO PLAN OF CARE: Patient states that he looks forward to returning to [facility] to continue his rehab before returning home.       Unice Bailey, LCSW [facility] Clinical Social Worker cell #: [phone]

## 2012-10-14 NOTE — Progress Notes (Signed)
Physical Therapy Treatment Patient Details Name: Tony Boyd MRN: [id number] DOB: 1955/08/23 Today's Date: 10/14/2012 Time: 4098-1191 PT Time Calculation (min): 18 min  PT Assessment / Plan / Recommendation  History of Present Illness Pt appears to be moving better today, but continues with scrotal and generalized edema   PT Comments   improveing in standing  Follow Up Recommendations        Does the patient have the potential to tolerate intense rehabilitation     Barriers to Discharge        Equipment Recommendations  None recommended by PT    Recommendations for Other Services Other (comment) (WOC nurse)  Frequency Min 3X/week   Progress towards PT Goals    Plan      Precautions / Restrictions Precautions Precaution Comments: scrotal swelling and excoration with pain and  bloody drainage (according to  chart, pt has increased 30 kg in weight in ~ one month)   Pertinent Vitals/Pain Pain in scrotum    Mobility  Bed Mobility Bed Mobility: Scooting to HOB;Supine to Sit;Sit to Supine Supine to Sit: 4: Min assist;HOB elevated;With rails (needs assist to keep scrotum elevated) Sitting - Scoot to Edge of Bed: 7: Independent Sit to Supine: 4: Min assist;With rail;HOB flat Scooting to HOB: 1: +2 Total assist Scooting to [facility]: Patient Percentage: 80% Details for Bed Mobility Assistance: easier bed mobility once scrotum was supported Transfers Transfers: Sit to Stand;Stand to Sit Sit to Stand: From bed;5: Supervision Stand to Sit: 5: Supervision Details for Transfer Assistance: pt maintains wide stance Ambulation/Gait Ambulation/Gait Assistance: Not tested (comment)    Exercises     PT Diagnosis:    PT Problem List:   PT Treatment Interventions:     PT Goals (current goals can now be found in the care plan section) Acute Rehab PT Goals Patient Stated Goal: to stand PT Goal Formulation: With patient Time For Goal Achievement: 10/27/12 Potential to Achieve Goals:  Fair  Visit Information  Last PT Received On: 10/14/12 Assistance Needed: +2 History of Present Illness: Pt appears to be moving better today, but continues with scrotal and generalized edema    Subjective Data  Patient Stated Goal: to stand   Cognition  Cognition Arousal/Alertness: Awake/alert Behavior During Therapy: WFL for tasks assessed/performed Overall Cognitive Status: Within Functional Limits for tasks assessed General Comments: Pt needs much encouragement.  He limits himself due to pain and swelling    Balance  Balance Balance Assessed: Yes Static Sitting Balance Static Sitting - Balance Support: Bilateral upper extremity supported;Feet supported Static Sitting - Level of Assistance: 7: Independent  End of Session PT - End of Session Activity Tolerance: Patient limited by pain Patient left: in bed;with call bell/phone within reach Nurse Communication: Mobility status   GP    Rosey Bath K. [place], [place] [phone] 10/14/2012, 12:40 PM

## 2012-10-14 NOTE — Progress Notes (Signed)
Inpatient Diabetes Program Recommendations  AACE/ADA: New Consensus Statement on Inpatient Glycemic Control (2013)  Target Ranges:  Prepandial:   less than 140 mg/dL      Peak postprandial:   less than 180 mg/dL (1-2 hours)      Critically ill patients:  140 - 180 mg/dL     Results for TASHEEM, ELMS (MRN [id number]) as of 10/14/2012 14:28  Ref. Range 10/13/2012 23:48 10/14/2012 04:04 10/14/2012 07:55 10/14/2012 11:38  Glucose-Capillary Latest Range: 70-99 mg/dL 409 (H) 811 (H) 914 (H) 203 (H)    **Patient getting CBG checks Q4 hours.  Not sure why the nurses are checking so frequently since patient is getting Novolog tid at this point.  **Patient noted to have elevated postprandial CBGs today.  Eating 100% of meals per documentation.  If this trend continues, may want to add scheduled Novolog meal coverage to help better control CBGs.  Recommend Novolog 4 units tid with meals to start.   Will follow. Ambrose Finland RN, MSN, CDE Diabetes Coordinator Inpatient Diabetes Program [phone]

## 2012-10-14 NOTE — Progress Notes (Signed)
Physical Therapy Treatment Patient Details Name: Tony Boyd MRN: [id number] DOB: Dec 14, 1955 Today's Date: 10/14/2012 Time: 4098-1191 PT Time Calculation (min): 18 min  PT Assessment / Plan / Recommendation  History of Present Illness continues steady improvement   PT Comments   Interdry changed with old piece washed and hung up to dry in shower Scrotal sling worked well to provide support and positioning to allow him to move legs to begin to walk  Follow Up Recommendations        Does the patient have the potential to tolerate intense rehabilitation     Barriers to Discharge        Equipment Recommendations  None recommended by PT    Recommendations for Other Services Other (comment) (WOC nurse)  Frequency Min 3X/week   Progress towards PT Goals Progress towards PT goals: Progressing toward goals  Plan Current plan remains appropriate    Precautions / Restrictions Precautions Precaution Comments: scrotal swelling and excoration with pain and  bloody drainage (according to  chart, pt has increased 30 kg in weight in ~ one month)   Pertinent Vitals/Pain copntinued with pain in scotum, but he is tolerating it better    Mobility  Bed Mobility Bed Mobility: Sit to Supine;Supine to Sit Supine to Sit: 4: Min assist;HOB elevated;With rails Sitting - Scoot to Edge of Bed: 7: Independent Sit to Supine: 4: Min assist;With rail;HOB flat Scooting to HOB: 1: +2 Total assist Scooting to [facility]: Patient Percentage: 80% Details for Bed Mobility Assistance: worked with OT to apply scrotal hammock fashioned with empty ice bag and velfoam  Pt able to move better once scrotum supported Transfers Transfers: Sit to Stand;Stand to Sit Sit to Stand: From bed;5: Supervision Stand to Sit: 5: Supervision Details for Transfer Assistance: pt maintains wide stance Ambulation/Gait Ambulation/Gait Assistance: 3: Mod assist Ambulation Distance (Feet): 15 Feet Assistive device: Rolling walker  (wide) Ambulation/Gait Assistance Details: cues to step up into RW Gait Pattern: Wide base of support;Step-to pattern Gait velocity: slow General Gait Details: pt reports he feels better with scrotum supported. He still tends to push RW too far forward and tends to speed up as he gets fatigued    Exercises     PT Diagnosis:    PT Problem List:   PT Treatment Interventions:     PT Goals (current goals can now be found in the care plan section) Acute Rehab PT Goals Patient Stated Goal: to stand PT Goal Formulation: With patient Time For Goal Achievement: 10/27/12 Potential to Achieve Goals: Fair  Visit Information  Last PT Received On: 10/14/12 Assistance Needed: +2 PT/OT Co-Evaluation/Treatment: Yes History of Present Illness: continues steady improvement    Subjective Data  Patient Stated Goal: to stand   Cognition  Cognition Arousal/Alertness: Awake/alert Behavior During Therapy: WFL for tasks assessed/performed Overall Cognitive Status: Within Functional Limits for tasks assessed General Comments: Pt needs much encouragement.  He limits himself due to pain and swelling    Balance  Balance Balance Assessed: Yes Static Sitting Balance Static Sitting - Balance Support: Bilateral upper extremity supported;Feet supported Static Sitting - Level of Assistance: 7: Independent  End of Session PT - End of Session Activity Tolerance: Patient limited by pain Patient left: in bed;with call bell/phone within reach Nurse Communication: Mobility status   GP    Rosey Bath K. [place], [place] [phone] 10/14/2012, 2:44 PM

## 2012-10-14 NOTE — Progress Notes (Signed)
Patient has been refusing to be turned or repositioned during this shift. Patient says he will allow staff to clean him up "later today." Will continue to monitor and encourage. Leeroy Cha RN

## 2012-10-14 NOTE — Evaluation (Signed)
Occupational Therapy Evaluation Patient Details Name: Tony Boyd MRN: [id number] DOB: 1956/01/25 Today's Date: 10/14/2012 Time: 8657-8469 OT Time Calculation (min): 27 min  OT Assessment / Plan / Recommendation History of present illness pt was confused at SNF and found to have hponatremia.  He is limited by pain with adls due to swelling in scrotal area.   Clinical Impression   Pt will benefit from skilled OT to increase safety and independence with adls.  Goals in acute are for supervision to min A level.  Pt reports he completed adls with set up prior to admission.      OT Assessment  Patient needs continued OT Services    Follow Up Recommendations  SNF    Barriers to Discharge      Equipment Recommendations  None recommended by OT    Recommendations for Other Services    Frequency  Min 2X/week    Precautions / Restrictions Precautions Precaution Comments: scrotal swelling and excoration with pain and  bloody drainage (according to  chart, pt has increased 30 kg in weight in ~ one month)   Pertinent Vitals/Pain Pain in scrotum.  Fashioned hammock support which pt states helped    ADL  Grooming: Set up Where Assessed - Grooming: Unsupported sitting Upper Body Bathing: Set up Where Assessed - Upper Body Bathing: Unsupported sitting Upper Body Dressing: Minimal assistance (iv) Where Assessed - Upper Body Dressing: Unsupported sitting Toilet Transfer: Simulated;Minimal assistance Toilet Transfer Method: Sit to stand Toilet Transfer Equipment:  (ambulated around bed) Equipment Used: Rolling walker Transfers/Ambulation Related to ADLs: pt needed cues to keep walker close to body and for speed ADL Comments: Co tx with PT to make scrotal sling (ice bag,velfoam strap) for comfort when walking.  Loosened strap in bed.  Pt reports this helps.  Requested wide commode from portables.  Pt is limited with adls due to scrotal pain.  LB adls max A.    OT Diagnosis: Generalized  weakness  OT Problem List: Decreased activity tolerance;Decreased safety awareness;Decreased knowledge of use of DME or AE;Pain;Increased edema;Decreased strength OT Treatment Interventions: Self-care/ADL training;Therapeutic exercise;Therapeutic activities;Patient/family education   OT Goals(Current goals can be found in the care plan section) Acute Rehab OT Goals Patient Stated Goal: get rid of pain/swelling: be more comfortable OT Goal Formulation: With patient Time For Goal Achievement: 10/28/12 Potential to Achieve Goals: Good ADL Goals Pt Will Perform Grooming: with supervision;standing Pt Will Perform Lower Body Bathing: with min assist;sit to/from stand;with adaptive equipment Pt Will Perform Lower Body Dressing: with min assist;with adaptive equipment;sit to/from stand Pt Will Transfer to Toilet: with min guard assist;ambulating;bedside commode  Visit Information  Last OT Received On: 10/14/12 Assistance Needed: +2 History of Present Illness: pt was confused at SNF and found to have hponatremia.  He is limited by pain with adls due to swelling in scrotal area.       Prior Functioning     Home Living Family/patient expects to be discharged to:: Skilled nursing facility Prior Function Level of Independence: Independent with assistive device(s) Comments: pt states he was able to don sweat pants/tshirts and slip on shoes with set up Communication Communication: No difficulties         Vision/Perception     Cognition  Cognition Arousal/Alertness: Awake/alert Behavior During Therapy: WFL for tasks assessed/performed Overall Cognitive Status: Within Functional Limits for tasks assessed General Comments: Pt needs much encouragement.  He limits himself due to pain and swelling    Extremity/Trunk Assessment Upper Extremity Assessment Upper Extremity  Assessment: Overall WFL for tasks assessed     Mobility Bed Mobility Bed Mobility: Sit to Supine;Supine to  Sit Supine to Sit: 4: Min assist;HOB elevated;With rails Sitting - Scoot to Edge of Bed: 7: Independent Sit to Supine: 4: Min assist;With rail;HOB flat Scooting to HOB: 1: +2 Total assist Scooting to [facility]: Patient Percentage: 80% Details for Bed Mobility Assistance: worked with OT to apply scrotal hammock fashioned with empty ice bag and velfoam  Pt able to move better once scrotum supported Transfers Sit to Stand: From bed;5: Supervision Stand to Sit: 5: Supervision Details for Transfer Assistance: pt maintains wide stance     Exercise     Balance Balance Balance Assessed: Yes Static Sitting Balance Static Sitting - Balance Support: Bilateral upper extremity supported;Feet supported Static Sitting - Level of Assistance: 7: Independent   End of Session OT - End of Session Activity Tolerance: Patient tolerated treatment well Patient left: in bed;with call bell/phone within reach  GO     Tony Boyd 10/14/2012, 3:29 PM Marica Otter, OTR/L [phone] 10/14/2012

## 2012-10-14 NOTE — Progress Notes (Signed)
[facility] PROGRESS NOTE  Tony Boyd MVH:[id number] DOB: 1955-03-11 DOA: 10/12/2012 PCP: [name], DO  Assessment/Plan: #1 acute hepatic encephalopathy  Patient does have a history of cirrhosis secondary to hepatitis C and alcohol abuse recently discharged with acute hepatic encephalopathy presented to the emergency room from a nursing facility with altered mental status and disorientation. Patient more alert today and following commands.  Ammonia level  elevated at 156 on admission.. Patient moving all extremities spontaneously. No neurological deficits noted. Chest x-ray is negative for any acute infiltrate. Urinalysis is not consistent with a UTI. Patient is afebrile. Patient does have a normal white count. Change lactulose to 50g TID. Continue Xifaxan. Continue Lasix and spironolactone. Follow.   #2 hyponatremia  Likely secondary to hypervolemic hyponatremia secondary to cirrhosis.  Urine sodium pending. Urine creatinine. Chest x-ray consistent with pulmonary edema. Patient with recent TSH done which was within normal limits. Continue diuresis with IV Lasix and spironolactone. Follow.  #3 cirrhosis/hepatitis C/history of esophageal varices  Patient with history of cirrhosis felt to be secondary to hepatitis C and alcohol abuse. Patient presented with hepatic encephalopathy. LFTs are slightly elevated. Patient appears volume overloaded. Abdominal ultrasound with small amount of ascites. Change IV Lasix 80 mg to every 6 hours. Increased spironolactone to 200mg  daily. Continue Xifaxan. Change lactulose 50g  3 times daily. May consider Inderal secondary to hx of esophageal varices. Follow.  #4 anasarca/scrotal swelling  Likely secondary to cirrhosis. Chest x-ray consistent with pulmonary edema. Will increase Lasix 80 mg IV every 6 hours. Continue increased spironolactone 200mg  daily. May need some albumin. Will follow.  #5 thrombocytopenia  Chronic in nature. Likely secondary to  cirrhosis. Monitor platelet counts. Follow. Transfuse if platelet counts less than 10,000 or active bleeding.  #6 diabetes mellitus  Hemoglobin A1c was 5.3 on 10/06/2012. Continue sliding scale insulin.  #7 hypothyroidism  TSH within normal limits of 2.243 on 09/17/2012. Continue Synthroid.  #8 seizure disorder  Change IV Keppra to oral. #9 protein calorie malnutrition  Secondary to cirrhosis. Follow.  #10 macrocytic anemia  Likely secondary to history of alcohol abuse. No overt GI bleed. Follow H&H.  #11 prophylaxis  PPI for GI prophylaxis. SCDs for DVT prophylaxis.      Code Status: Partial  Family Communication: Updated patient no family at bedside. Disposition Plan: Skilled nursing facility when medically stable   Consultants:  None  Procedures:  Chest x-ray 10/12/2012  Antibiotics:  None  HPI/Subjective: Patient more alert. C/O chronic pain.  Objective: Filed Vitals:   10/14/12 1431  BP: 129/60  Pulse:   Temp: 97.9 F (36.6 C)  Resp: 18    Intake/Output Summary (Last 24 hours) at 10/14/12 1722 Last data filed at 10/14/12 1700  Gross per 24 hour  Intake   1940 ml  Output   2150 ml  Net   -210 ml   Filed Weights   10/12/12 1547 10/14/12 0517  Weight: 144.5 kg (318 lb 9 oz) 140.7 kg (310 lb 3 oz)    Exam:   General:  NAD. Alert. Edematous.  Cardiovascular: RRR. Distant HS  Respiratory: CTAB anterior lung fields.  Abdomen: Soft/NT/Distended/+BS. Scrotal edema.  Musculoskeletal: Edematous. [employer].  Data Reviewed: Basic Metabolic Panel:  Recent Labs Lab 10/08/12 0655 10/10/12 0355 10/12/12 1230 10/14/12 0444  NA 134* 127* 129* 129*  K 3.1* 3.7 3.7 3.8  CL 95* 92* 92* 92*  CO2 35* 34* 30 34*  GLUCOSE 164* 151* 198* 221*  BUN 7 8 14  17  CREATININE 0.69 0.76 0.74 0.89  CALCIUM 7.7* 7.8* 8.0* 7.6*  MG  --   --  1.6  --    Liver Function Tests:  Recent Labs Lab 10/12/12 1230 10/14/12 0444  AST 42* 37  ALT 26 21  ALKPHOS  233* 202*  BILITOT 3.4* 3.6*  PROT 5.8* 5.0*  ALBUMIN 1.8* 1.6*   No results found for this basename: LIPASE, AMYLASE,  in the last 168 hours  Recent Labs Lab 10/12/12 1230  AMMONIA 156*   CBC:  Recent Labs Lab 10/08/12 0655 10/12/12 1230 10/14/12 0444  WBC 3.4* 6.9 5.4  NEUTROABS  --  5.3  --   HGB 9.8* 11.2* 9.3*  HCT 28.6* 32.2* 27.3*  MCV 106.3* 103.5* 105.0*  PLT 35* 28* 50*   Cardiac Enzymes: No results found for this basename: CKTOTAL, CKMB, CKMBINDEX, TROPONINI,  in the last 168 hours BNP (last 3 results) No results found for this basename: PROBNP,  in the last 8760 hours CBG:  Recent Labs Lab 10/13/12 2348 10/14/12 0404 10/14/12 0755 10/14/12 1138 10/14/12 1654  GLUCAP 214* 217* 193* 203* 238*    Recent Results (from the past 240 hour(s))  URINE CULTURE     Status: None   Collection Time    10/12/12 11:45 AM      Result Value Range Status   Specimen Description URINE, CATHETERIZED   Final   Special Requests NONE   Final   Culture  Setup Time     Final   Value: 10/12/2012 17:37     Performed at [facility] Count     Final   Value: >=100,000 COLONIES/ML     Performed at [facility]   Culture     Final   Value: Multiple bacterial morphotypes present, none predominant. Suggest appropriate recollection if clinically indicated.     Performed at [facility]   Report Status 10/13/2012 FINAL   Final  MRSA PCR SCREENING     Status: None   Collection Time    10/12/12  3:54 PM      Result Value Range Status   MRSA by PCR NEGATIVE  NEGATIVE Final   Comment:            The GeneXpert MRSA Assay (FDA     approved for NASAL specimens     only), is one component of a     comprehensive MRSA colonization     surveillance program. It is not     intended to diagnose MRSA     infection nor to guide or     monitor treatment for     MRSA infections.     Performed at [facility]     Studies: US Abdomen  Complete  10/12/2012   *RADIOLOGY REPORT*  Clinical Data:  Cirrhosis, question ascites.  COMPLETE ABDOMINAL ULTRASOUND  Comparison:  10/06/2012  Findings:  Gallbladder:  Prior cholecystectomy.  Common bile duct:   Normal caliber, 5 mm.  Liver:  Heterogeneous echotexture with nodular contours compatible with history of cirrhosis.  1.5 cm hypodense area within the right lobe anteriorly, likely small cyst.  IVC:  Appears normal.  Pancreas:  Unable to visualize due to body habitus and overlying bowel gas.  Spleen:  Enlarged with a length of 13.7 cm and a volume of 808 ml. No focal abnormality.  Right Kidney:   Normal in size and parenchymal echogenicity.  No evidence of mass or hydronephrosis.  Left Kidney:  4.1 cm simple appearing cyst off the upper pole.  No hydronephrosis.  Normal echotexture.  Abdominal aorta:  No aneurysm.  Small amount of ascites in the lower abdomen bilaterally  IMPRESSION: Changes of cirrhosis.  Associated splenomegaly with a small amount of pelvic ascites.   Original Report Authenticated By: Charlett Nose, M.D.   Ir Fluoro Guide Cv Line Right  10/13/2012   * RADIOLOGY REPORT *  Clinical data: Cirrhosis, poor IV access, need venous access.  PICC PLACEMENT WITH ULTRASOUND AND FLUOROSCOPY  Technique: After written informed consent was obtained, patient was placed in the supine position on angiographic table. Patency of the right brachial vein was confirmed with ultrasound with image documentation. An appropriate skin site was determined. Skin site was marked. Region was prepped using maximum barrier technique including cap and mask, sterile gown, sterile gloves, large sterile sheet, and Chlorhexidine   as cutaneous antisepsis.  The region was infiltrated locally with 1% lidocaine.   Under real-time ultrasound guidance, the right brachial vein was accessed with a 21 gauge micropuncture needle; the needle tip within the vein was confirmed with ultrasound image documentation.   Needle exchanged  over a 018 guidewire for a peel-away sheath, through which a 5-French double- lumen power injectable PICC trimmed to 42cm was advanced, positioned with its tip near the cavoatrial junction.  Spot chest radiograph confirms appropriate catheter position.  Catheter was flushed per protocol and secured externally with 0-Prolene sutures. The patient tolerated procedure well, with no immediate complication.  Fluoroscopy time: 5 seconds  IMPRESSION: Technically successful five [place] double lumen power injectable and PICC placement   Original Report Authenticated By: D. Andria Rhein, MD   Ir US Guide Vasc Access Right  10/13/2012   * RADIOLOGY REPORT *  Clinical data: Cirrhosis, poor IV access, need venous access.  PICC PLACEMENT WITH ULTRASOUND AND FLUOROSCOPY  Technique: After written informed consent was obtained, patient was placed in the supine position on angiographic table. Patency of the right brachial vein was confirmed with ultrasound with image documentation. An appropriate skin site was determined. Skin site was marked. Region was prepped using maximum barrier technique including cap and mask, sterile gown, sterile gloves, large sterile sheet, and Chlorhexidine   as cutaneous antisepsis.  The region was infiltrated locally with 1% lidocaine.   Under real-time ultrasound guidance, the right brachial vein was accessed with a 21 gauge micropuncture needle; the needle tip within the vein was confirmed with ultrasound image documentation.   Needle exchanged over a 018 guidewire for a peel-away sheath, through which a 5-French double- lumen power injectable PICC trimmed to 42cm was advanced, positioned with its tip near the cavoatrial junction.  Spot chest radiograph confirms appropriate catheter position.  Catheter was flushed per protocol and secured externally with 0-Prolene sutures. The patient tolerated procedure well, with no immediate complication.  Fluoroscopy time: 5 seconds  IMPRESSION: Technically  successful five [place] double lumen power injectable and PICC placement   Original Report Authenticated By: D. Andria Rhein, MD    Scheduled Meds: . ferrous sulfate  325 mg Oral QHS  . folic acid  1 mg Oral Daily  . furosemide  80 mg Intravenous Q6H  . insulin aspart  0-15 Units Subcutaneous TID WC  . insulin glargine  20 Units Subcutaneous QHS  . lactulose  45 g Oral QID  . levETIRAcetam  500 mg Oral BID  . levothyroxine  75 mcg Oral Q breakfast  . oxyCODONE  10 mg Oral Q4H  . [START  ON 10/15/2012] pantoprazole  40 mg Oral Daily  . potassium chloride SA  20 mEq Oral TID  . rifaximin  550 mg Oral BID  . risperiDONE  0.25 mg Oral BID  . spironolactone  200 mg Oral Daily   Continuous Infusions:   Principal Problem:   Hepatic encephalopathy Active Problems:   ETOH abuse   Hepatitis C   Coagulopathy   Hyponatremia   Cirrhosis   Esophageal varices without mention of bleeding   Seizure disorder   Thrombocytopenia, acquired   Hypothyroidism   Diabetes mellitus   Anasarca   Scrotal swelling   Anemia of chronic disease   Moderate protein-calorie malnutrition   Weakness generalized    Time spent: > 35 mins    [facility]  [facility] Pager [phone]. If 7PM-7AM, please contact night-coverage at [email or web address], password [facility] 10/14/2012, 5:22 PM  LOS: 2 days

## 2012-10-15 ENCOUNTER — Inpatient Hospital Stay (HOSPITAL_COMMUNITY): Payer: Medicare Other

## 2012-10-15 LAB — COMPREHENSIVE METABOLIC PANEL
Albumin: 1.7 g/dL — ABNORMAL LOW (ref 3.5–5.2)
BUN: 14 mg/dL (ref 6–23)
Calcium: 7.7 mg/dL — ABNORMAL LOW (ref 8.4–10.5)
GFR calc Af Amer: >90 mL/min (ref >90)
Glucose, Bld: 209 mg/dL — ABNORMAL HIGH (ref 70–99)
Total Protein: 5.3 g/dL — ABNORMAL LOW (ref 6.0–8.3)

## 2012-10-15 LAB — CBC
HCT: 28.4 % — ABNORMAL LOW (ref 39.0–52.0)
Hemoglobin: 10 g/dL — ABNORMAL LOW (ref 13.0–17.0)
RBC: 2.73 MIL/uL — ABNORMAL LOW (ref 4.22–5.81)
WBC: 5.9 10*3/uL (ref 4.0–10.5)

## 2012-10-15 LAB — GLUCOSE, CAPILLARY
Glucose-Capillary: 150 mg/dL — ABNORMAL HIGH (ref 70–99)
Glucose-Capillary: 195 mg/dL — ABNORMAL HIGH (ref 70–99)

## 2012-10-15 LAB — PROTIME-INR: INR: 1.83 — ABNORMAL HIGH (ref 0.00–1.49)

## 2012-10-15 NOTE — Progress Notes (Signed)
Tony Boyd ZOX:[id number] DOB: 1955/03/25 DOA: 10/12/2012  PCP: [name], DO  Brief narrative: 57 year old alcoholic, history of cirrhosis [MELD score 31] , hepatitis C, diabetes, varices, bipolar and recent hospitalization 8/11-8/15 was readmitted 10/12/2012 with generalized confusion found to have elevated ammonia level  Past medical history-As per Problem list Chart reviewed as below-the Admission 8/11 with scrotal swelling and discomfort Admission 7/22 with confusion, ammonia bedtime 160, had some chronic hyponatremia as well Admission 08/21/2012 with hepatic encephalopathy, potential urinary tract infection with coagulase negative staph and had a seizure at the time secondary to noncompliance  Admission 08/08/12 with hepatic encephalopathy -noncompliance on lactulose-psychiatry was consulted at the time and it was found he had capacity admission 06/01/2012 with syncopal episode admission 03/20/2014with confusion and change in mental status Endoscopy 05/06/2012 =grade 3 varices Admission 04/16/2012 with hyponatremia and alcohol abuse Admission 01/23/2012 encephalopathy Admission 10/16/2011 hepatic encephalopathy, confusion Admission 04/18/2011 confusion Admission 10/09/2010 hepatic encephalopathy Admission 7/15/2012confusion and adult onset seizures likely secondary to alcohol and BZD withdrawal, right lower lobe pneumonia Admission 07/12/10 for Abd abcess s/p Perc drain Admission 11/02/07 for AMS Admission 07/27/07 for delirium   Consultants:  radiology  Procedures:  Paracentesis scheduled for 10/15/2012  Antibiotics:  Rifaximin chronically   Subjective  Doing well. More alert and oriented. Has some lower extremity and groin pain but states this is "much better" Tolerating diet Eating and drinking Knows where he is, what day it is, what year it is   Objective    Interim History: none  Telemetry: Sinus rhythm telemetry discontinued 8/20  Objective: Filed  Vitals:   10/14/12 0533 10/14/12 1431 10/14/12 2144 10/15/12 0415  BP: 113/58 129/60 137/70 128/64  Pulse: 94  98 97  Temp: 97.8 F (36.6 C) 97.9 F (36.6 C) 98.1 F (36.7 C) 98.2 F (36.8 C)  TempSrc: Oral Oral Oral Oral  Resp: 18 18 16 16   Height:      Weight:      SpO2: 100% 97% 97% 98%    Intake/Output Summary (Last 24 hours) at 10/15/12 0906 Last data filed at 10/15/12 0847  Gross per 24 hour  Intake   2140 ml  Output   2700 ml  Net   -560 ml    Exam:  General: alert Morbid obesity, Body mass index is 47.17 kg/(m^2).mild icterus Cardiovascular: S1-S2 no murmur rub or gallop Respiratory: clinically clear Abdomen: abdominal scars noted, anasarca, fluid thrill, Skin lower extremity has weeping wounds bilaterally,scrotum is grossly swollen with excoriations around Neurogrossly intact 2+ reflexes bilaterally, no asterixis  Data Reviewed: Basic Metabolic Panel:  Recent Labs Lab 10/10/12 0355 10/12/12 1230 10/14/12 0444 10/15/12 0455  NA 127* 129* 129* 128*  K 3.7 3.7 3.8 3.7  CL 92* 92* 92* 92*  CO2 34* 30 34* 33*  GLUCOSE 151* 198* 221* 209*  BUN 8 14 17 14   CREATININE 0.76 0.74 0.89 0.81  CALCIUM 7.8* 8.0* 7.6* 7.7*  MG  --  1.6  --   --    Liver Function Tests:  Recent Labs Lab 10/12/12 1230 10/14/12 0444 10/15/12 0455  AST 42* 37 42*  ALT 26 21 24   ALKPHOS 233* 202* 227*  BILITOT 3.4* 3.6* 2.8*  PROT 5.8* 5.0* 5.3*  ALBUMIN 1.8* 1.6* 1.7*   No results found for this basename: LIPASE, AMYLASE,  in the last 168 hours  Recent Labs Lab 10/12/12 1230  AMMONIA 156*   CBC:  Recent Labs Lab 10/12/12 1230 10/14/12 0444 10/15/12 0455  WBC 6.9 5.4  5.9  NEUTROABS 5.3  --   --   HGB 11.2* 9.3* 10.0*  HCT 32.2* 27.3* 28.4*  MCV 103.5* 105.0* 104.0*  PLT 28* 50* 52*   Cardiac Enzymes: No results found for this basename: CKTOTAL, CKMB, CKMBINDEX, TROPONINI,  in the last 168 hours BNP: No components found with this basename: POCBNP,   CBG:  Recent Labs Lab 10/14/12 0755 10/14/12 1138 10/14/12 1654 10/14/12 2116 10/15/12 0725  GLUCAP 193* 203* 238* 222* 150*    Recent Results (from the past 240 hour(s))  URINE CULTURE     Status: None   Collection Time    10/12/12 11:45 AM      Result Value Range Status   Specimen Description URINE, CATHETERIZED   Final   Special Requests NONE   Final   Culture  Setup Time     Final   Value: 10/12/2012 17:37     Performed at [facility] Count     Final   Value: >=100,000 COLONIES/ML     Performed at [facility]   Culture     Final   Value: Multiple bacterial morphotypes present, none predominant. Suggest appropriate recollection if clinically indicated.     Performed at [facility]   Report Status 10/13/2012 FINAL   Final  MRSA PCR SCREENING     Status: None   Collection Time    10/12/12  3:54 PM      Result Value Range Status   MRSA by PCR NEGATIVE  NEGATIVE Final   Comment:            The GeneXpert MRSA Assay (FDA     approved for NASAL specimens     only), is one component of a     comprehensive MRSA colonization     surveillance program. It is not     intended to diagnose MRSA     infection nor to guide or     monitor treatment for     MRSA infections.     Performed at [facility]     Studies:              All Imaging reviewed and is as per above notation   Scheduled Meds: . ferrous sulfate  325 mg Oral QHS  . folic acid  1 mg Oral Daily  . furosemide  80 mg Intravenous Q6H  . insulin aspart  0-15 Units Subcutaneous TID WC  . insulin glargine  20 Units Subcutaneous QHS  . lactulose  50 g Oral TID  . levETIRAcetam  500 mg Oral BID  . levothyroxine  75 mcg Oral Q breakfast  . oxyCODONE  10 mg Oral Q4H  . pantoprazole  40 mg Oral Daily  . potassium chloride SA  20 mEq Oral TID  . rifaximin  550 mg Oral BID  . risperiDONE  0.25 mg Oral BID  . spironolactone  200 mg Oral Daily   Continuous Infusions:     Assessment/Plan: Assessment/Plan:  #1 acute hepatic encephalopathy-resolved  Patient more alert today and following commands. Ammonia level elevated at 156 on admission.. Patient moving all extremities spontaneously. No neurological deficits noted. Chest x-ray is negative for any acute infiltrate. Urinalysis >100,000 multiple coliform units.  Change lactulose to 50g TID. Continue Xifaxan.  Continue Lasix 80 mg every 6 hourly and spironolactone increased from 200mg -->300mg  daily.  #2 hyponatremia  Likely secondary to hypervolemic hyponatremia secondary to cirrhosis. Urine osm 336.  Fena useless as  was on diuretics prior to admit atient appears volume overloaded. . May consider Inderal secondary to hx of esophageal varices.  #4 anasarca/scrotal swelling  Likely secondary to cirrhosis. Chest x-ray consistent with pulmonary edema. Will increase Lasix 80 mg IV every 6 hours. Continue increased spironolactone 200mg -->300.  Get Urine Na/K to figure if Aldosterone blockade sufficent.  May need some albumin. W #5 thrombocytopenia  Chronic in nature. Likely secondary to cirrhosis. Monitor platelet counts. Follow. Transfuse if platelet counts less than 10,000 or active bleeding.  #6 diabetes mellitus  Hemoglobin A1c was 5.3 on 10/06/2012. Continue sliding scale insulin. Sugars mod controlled 150-219 #7 hypothyroidism  TSH within normal limits of 2.243 on 09/17/2012. Continue Synthroid 75 MCG daily, Rpt TSH in 6 weeks #8 seizure disorder, likely ETOh assosc  Change IV Keppra to oral, 500 mg bid #9 protein calorie malnutrition  Secondary to cirrhosis. Follow.  #10 macrocytic anemia  Likely secondary to history of alcohol abuse. No overt GI bleed. Follow H&H.  #11H/o Grade 3 Varices-Will re-implment inderal this admit #12 prophylaxis  PPI for GI prophylaxis. SCDs for DVT prophylaxis.   Code Status: Limited Family Communication:  None at bedside Disposition Plan:  inpt   Pleas Koch, MD  [facility] Pager [phone] 10/15/2012, 9:06 AM    LOS: 3 days

## 2012-10-15 NOTE — Progress Notes (Signed)
Patient was brought down to [place] for paracentesis for abdominal distention. No significant fluid seen, d/w Dr. Mahala Menghini.   Pattricia Boss PA-C Interventional Radiology  10/15/12  12:34 PM

## 2012-10-15 NOTE — Progress Notes (Signed)
Physical Therapy Treatment Patient Details Name: Tony Boyd MRN: [id number] DOB: 02-Apr-1955 Today's Date: 10/15/2012 Time: 1035-1100 PT Time Calculation (min): 25 min  PT Assessment / Plan / Recommendation  History of Present Illness     PT Comments   Pt feeling better. AxOx4. Assisted OOB to amb.  Follow Up Recommendations  SNF     Does the patient have the potential to tolerate intense rehabilitation     Barriers to Discharge        Equipment Recommendations       Recommendations for Other Services    Frequency     Progress towards PT Goals Progress towards PT goals: Progressing toward goals  Plan Current plan remains appropriate    Precautions / Restrictions Precautions Precaution Comments: scrotal swelling and excoration with pain and  bloody drainage (according to  chart, pt has increased 30 kg in weight in ~ one month) Restrictions Weight Bearing Restrictions: No    Pertinent Vitals/Pain C/o sacral discomfort/swelling    Mobility  Bed Mobility Bed Mobility: Sit to Supine;Supine to Sit Supine to Sit: 4: Min assist;HOB elevated;With rails Sitting - Scoot to Edge of Bed: 6: Modified independent (Device/Increase time) Sit to Supine: 4: Min assist;With rail;HOB flat Scooting to HOB: 1: +2 Total assist Scooting to [facility]: Patient Percentage: 80% Details for Bed Mobility Assistance: increased time Transfers Transfers: Sit to Stand;Stand to Sit Sit to Stand: From bed;5: Supervision Stand to Sit: 5: Supervision Details for Transfer Assistance: pt maintains wide stance Ambulation/Gait Ambulation/Gait Assistance: 3: Mod assist Ambulation Distance (Feet): 95 Feet Assistive device: Rolling walker Ambulation/Gait Assistance Details: increased time with wide BOS due to scrotal swelling  Gait Pattern: Wide base of support;Step-to pattern Gait velocity: slow General Gait Details: pt reports he feels better with scrotum supported. He still tends to push RW too far forward  and tends to speed up as he gets fatigued     PT Goals (current goals can now be found in the care plan section)    Visit Information  Last PT Received On: 10/15/12 Assistance Needed: +2    Subjective Data      Cognition       Balance     End of Session PT - End of Session Equipment Utilized During Treatment: Gait belt Activity Tolerance: Patient tolerated treatment well Patient left: in bed;with call bell/phone within reach Nurse Communication: Mobility status   Felecia Shelling  PTA [facility]  Acute  Rehab Pager      [phone]

## 2012-10-16 LAB — COMPREHENSIVE METABOLIC PANEL
AST: 41 U/L — ABNORMAL HIGH (ref 0–37)
Albumin: 1.5 g/dL — ABNORMAL LOW (ref 3.5–5.2)
Calcium: 7.5 mg/dL — ABNORMAL LOW (ref 8.4–10.5)
Creatinine, Ser: 0.75 mg/dL (ref 0.50–1.35)
Total Protein: 5 g/dL — ABNORMAL LOW (ref 6.0–8.3)

## 2012-10-16 LAB — NA AND K (SODIUM & POTASSIUM), RAND UR: Potassium Urine: 35 mEq/L

## 2012-10-16 LAB — CBC
Hemoglobin: 9.2 g/dL — ABNORMAL LOW (ref 13.0–17.0)
MCH: 36.4 pg — ABNORMAL HIGH (ref 26.0–34.0)
MCV: 103.2 fL — ABNORMAL HIGH (ref 78.0–100.0)
RBC: 2.53 MIL/uL — ABNORMAL LOW (ref 4.22–5.81)

## 2012-10-16 LAB — GLUCOSE, CAPILLARY: Glucose-Capillary: 253 mg/dL — ABNORMAL HIGH (ref 70–99)

## 2012-10-16 NOTE — Progress Notes (Signed)
Tony Boyd WUJ:[id number] DOB: 1955/05/09 DOA: 10/12/2012  PCP: [name], DO  Brief narrative: 57 year old alcoholic, history of cirrhosis [MELD score 31] , hepatitis C, diabetes, varices, bipolar and recent hospitalization 8/11-8/15 was readmitted 10/12/2012 with generalized confusion found to have elevated ammonia level.  He was found to have anasarca as well and scrotal erythema with excoeriation and bleeding.  An attemtp was made 8/20 at paracentesis, but no fluid was found to draw off.  Past medical history-As per Problem list Chart reviewed as below-the Admission 8/11 with scrotal swelling and discomfort Admission 7/22 with confusion, ammonia bedtime 160, had some chronic hyponatremia as well Admission 08/21/2012 with hepatic encephalopathy, potential urinary tract infection with coagulase negative staph and had a seizure at the time secondary to noncompliance  Admission 08/08/12 with hepatic encephalopathy -noncompliance on lactulose-psychiatry was consulted at the time and it was found he had capacity admission 06/01/2012 with syncopal episode admission 03/20/2014with confusion and change in mental status Endoscopy 05/06/2012 =grade 3 varices Admission 04/16/2012 with hyponatremia and alcohol abuse Admission 01/23/2012 encephalopathy Admission 10/16/2011 hepatic encephalopathy, confusion Admission 04/18/2011 confusion Admission 10/09/2010 hepatic encephalopathy Admission 7/15/2012confusion and adult onset seizures likely secondary to alcohol and BZD withdrawal, right lower lobe pneumonia Admission 07/12/10 for Abd abcess s/p Perc drain Admission 11/02/07 for AMS Admission 07/27/07 for delirium   Consultants:  radiology  Procedures:  Paracentesis scheduled for 10/15/2012  Antibiotics:  Rifaximin chronically   Subjective  Doing well. Has passed more urine.  2 large stools today.  No n/v.  tol diet.  Feels subjectively swelling is decreasing No other issues   Objective     Interim History: none  Telemetry: Sinus rhythm telemetry discontinued 8/20  Objective: Filed Vitals:   10/15/12 0415 10/15/12 1409 10/15/12 2313 10/16/12 0500  BP: 128/64 128/71 120/57 130/61  Pulse: 97 102 98 94  Temp: 98.2 F (36.8 C) 98.1 F (36.7 C) 98.7 F (37.1 C) 98.5 F (36.9 C)  TempSrc: Oral Oral Oral Oral  Resp: 16 16 18 16   Height:      Weight:      SpO2: 98% 100% 97% 100%    Intake/Output Summary (Last 24 hours) at 10/16/12 1101 Last data filed at 10/16/12 0900  Gross per 24 hour  Intake    720 ml  Output   3400 ml  Net  -2680 ml    Exam:  General: alert Morbid obesity, Body mass index is 47.17 kg/(m^2).mild icterus Cardiovascular: S1-S2 no murmur rub or gallop Respiratory: clinically clear Abdomen: abdominal scars noted, anasarca, fluid thrill, Skin lower extremity has weeping wounds bilaterally,scrotum is grossly swollen with excoriations around-this is decreasing however and scotum less swollen Neurogrossly intact 2+ reflexes bilaterally, no asterixis  Data Reviewed: Basic Metabolic Panel:  Recent Labs Lab 10/10/12 0355 10/12/12 1230 10/14/12 0444 10/15/12 0455 10/16/12 0525  NA 127* 129* 129* 128* 125*  K 3.7 3.7 3.8 3.7 3.7  CL 92* 92* 92* 92* 90*  CO2 34* 30 34* 33* 32  GLUCOSE 151* 198* 221* 209* 225*  BUN 8 14 17 14 13   CREATININE 0.76 0.74 0.89 0.81 0.75  CALCIUM 7.8* 8.0* 7.6* 7.7* 7.5*  MG  --  1.6  --   --   --    Liver Function Tests:  Recent Labs Lab 10/12/12 1230 10/14/12 0444 10/15/12 0455 10/16/12 0525  AST 42* 37 42* 41*  ALT 26 21 24 23   ALKPHOS 233* 202* 227* 209*  BILITOT 3.4* 3.6* 2.8* 2.9*  PROT 5.8* 5.0*  5.3* 5.0*  ALBUMIN 1.8* 1.6* 1.7* 1.5*   No results found for this basename: LIPASE, AMYLASE,  in the last 168 hours  Recent Labs Lab 10/12/12 1230  AMMONIA 156*   CBC:  Recent Labs Lab 10/12/12 1230 10/14/12 0444 10/15/12 0455 10/16/12 0525  WBC 6.9 5.4 5.9 5.0  NEUTROABS 5.3  --    --   --   HGB 11.2* 9.3* 10.0* 9.2*  HCT 32.2* 27.3* 28.4* 26.1*  MCV 103.5* 105.0* 104.0* 103.2*  PLT 28* 50* 52* 48*   Cardiac Enzymes: No results found for this basename: CKTOTAL, CKMB, CKMBINDEX, TROPONINI,  in the last 168 hours BNP: No components found with this basename: POCBNP,  CBG:  Recent Labs Lab 10/14/12 2116 10/15/12 0725 10/15/12 1304 10/15/12 1700 10/16/12 0813  GLUCAP 222* 150* 195* 216* 178*    Recent Results (from the past 240 hour(s))  URINE CULTURE     Status: None   Collection Time    10/12/12 11:45 AM      Result Value Range Status   Specimen Description URINE, CATHETERIZED   Final   Special Requests NONE   Final   Culture  Setup Time     Final   Value: 10/12/2012 17:37     Performed at [facility] Count     Final   Value: >=100,000 COLONIES/ML     Performed at [facility]   Culture     Final   Value: Multiple bacterial morphotypes present, none predominant. Suggest appropriate recollection if clinically indicated.     Performed at [facility]   Report Status 10/13/2012 FINAL   Final  MRSA PCR SCREENING     Status: None   Collection Time    10/12/12  3:54 PM      Result Value Range Status   MRSA by PCR NEGATIVE  NEGATIVE Final   Comment:            The GeneXpert MRSA Assay (FDA     approved for NASAL specimens     only), is one component of a     comprehensive MRSA colonization     surveillance program. It is not     intended to diagnose MRSA     infection nor to guide or     monitor treatment for     MRSA infections.     Performed at [facility]     Studies:              All Imaging reviewed and is as per above notation   Scheduled Meds: . ferrous sulfate  325 mg Oral QHS  . folic acid  1 mg Oral Daily  . furosemide  80 mg Intravenous Q6H  . insulin aspart  0-15 Units Subcutaneous TID WC  . insulin glargine  20 Units Subcutaneous QHS  . lactulose  50 g Oral TID  . levETIRAcetam  500  mg Oral BID  . levothyroxine  75 mcg Oral Q breakfast  . oxyCODONE  10 mg Oral Q4H  . pantoprazole  40 mg Oral Daily  . potassium chloride SA  20 mEq Oral TID  . rifaximin  550 mg Oral BID  . risperiDONE  0.25 mg Oral BID  . spironolactone  200 mg Oral Daily   Continuous Infusions:    Assessment/Plan: Assessment/Plan:  #1 acute hepatic encephalopathy-resolved  Patient more alert today and following commands. Ammonia level elevated at 156 on admission.. Patient moving all  extremities spontaneously. No neurological deficits noted. Chest x-ray is negative for any acute infiltrate. Urinalysis >100,000 multiple coliform units.  Change lactulose to 50g TID. Continue Xifaxan.  Continue Lasix 80 mg every 6 hourly and spironolactone increased from 200mg -->400mg  daily.  #2 hyponatremia  Likely secondary to hypervolemic hyponatremia secondary to cirrhosis. Urine osm 336.  Fena useless as was on diuretics prior to admit May consider Inderal secondary to hx of esophageal varices.  #4 anasarca/scrotal swelling  Likely secondary to cirrhosis. Chest x-ray consistent with pulmonary edema. Will increase Lasix 80 mg IV every 6 hours. Output overnight was -3.88 liters Continue increased spironolactone 200mg -->300.  Urine Na/K-0.4 therefore will increase aldactone to 400 mg daily and re-check ratio on 8/23 #5 thrombocytopenia  Chronic in nature. Likely secondary to cirrhosis. Monitor platelet counts. Follow. Transfuse if platelet counts less than 10,000 or active bleeding.  #6 diabetes mellitus  Hemoglobin A1c was 5.3 on 10/06/2012. Continue sliding scale insulin. Sugars mod controlled 178-225 #7 hypothyroidism  TSH within normal limits of 2.243 on 09/17/2012. Continue Synthroid 75 MCG daily, Rpt TSH in 6 weeks #8 seizure disorder, likely ETOh assosc  Change IV Keppra to oral, 500 mg bid #9 protein calorie malnutrition  Secondary to cirrhosis. Follow.  #10 macrocytic anemia  Likely secondary to history of  alcohol abuse. No overt GI bleed. Follow H&H.  #11H/o Grade 3 Varices-Will re-implment inderal this admit #12 prophylaxis  PPI for GI prophylaxis. SCDs for DVT prophylaxis.   Code Status: Limited Family Communication:  None at bedside-daughter updated 8/20 Disposition Plan:  inpt   Pleas Koch, MD  [facility] Pager [phone] 10/16/2012, 11:01 AM    LOS: 4 days

## 2012-10-17 LAB — COMPREHENSIVE METABOLIC PANEL
ALT: 28 U/L (ref 0–53)
Albumin: 1.8 g/dL — ABNORMAL LOW (ref 3.5–5.2)
Alkaline Phosphatase: 235 U/L — ABNORMAL HIGH (ref 39–117)
BUN: 11 mg/dL (ref 6–23)
Chloride: 88 mEq/L — ABNORMAL LOW (ref 96–112)
Glucose, Bld: 175 mg/dL — ABNORMAL HIGH (ref 70–99)
Potassium: 3.9 mEq/L (ref 3.5–5.1)
Total Bilirubin: 3 mg/dL — ABNORMAL HIGH (ref 0.3–1.2)

## 2012-10-17 LAB — GLUCOSE, CAPILLARY

## 2012-10-17 MED ORDER — FUROSEMIDE 10 MG/ML IJ SOLN: 80.0000 mg | Freq: Four times a day (QID) | INTRAMUSCULAR | Status: DC

## 2012-10-17 MED ORDER — HEPARIN SOD (PORK) LOCK FLUSH 100 UNIT/ML IV SOLN
250.0000 [IU] | INTRAVENOUS | Status: AC | PRN
  Administered 2012-10-17: 500 [IU]

## 2012-10-17 MED ORDER — OXYCODONE HCL 10 MG PO TABS: 10.0000 mg | ORAL_TABLET | ORAL | Status: DC

## 2012-10-17 NOTE — Progress Notes (Signed)
Pt transferring to [facility].

## 2012-10-17 NOTE — Progress Notes (Signed)
Physical Therapy Treatment Patient Details Name: Hall Birchard MRN: [id number] DOB: 1955-10-06 Today's Date: 10/17/2012 Time: 7829-5621 PT Time Calculation (min): 25 min  PT Assessment / Plan / Recommendation  History of Present Illness Pt admitted for confusion.  From SNF wtih h/o cirrhosis, seizures.     PT Comments   Pt feeling better every day.  Assisted out of recliner and amb in hallway with increased distance.  Assisted back to bed.    Follow Up Recommendations  SNF     Does the patient have the potential to tolerate intense rehabilitation     Barriers to Discharge        Equipment Recommendations       Recommendations for Other Services    Frequency Min 3X/week   Progress towards PT Goals Progress towards PT goals: Progressing toward goals  Plan      Precautions / Restrictions Precautions Precautions: Fall Precaution Comments: scrotal swelling and excoration with pain and  bloody drainage (according to  chart, pt has increased 30 kg in weight in ~ one month) Restrictions Weight Bearing Restrictions: No    Pertinent Vitals/Pain C/o being "uncomfortable (scrotum) and usual aches and pains"    Mobility  Bed Mobility Bed Mobility: Sit to Supine Sit to Supine: 3: Mod assist Details for Bed Mobility Assistance: Mod assist to support B LE up onto bed Transfers Transfers: Sit to Stand;Stand to Sit Sit to Stand: 5: Supervision;From chair/3-in-1 Stand to Sit: 5: Supervision;To bed Details for Transfer Assistance: pt maintains wide stance and increased time Ambulation/Gait Ambulation/Gait Assistance: 4: Min assist Ambulation Distance (Feet): 135 Feet Assistive device: Rolling walker (Bariatric) Ambulation/Gait Assistance Details: increased time with wide BOS Gait Pattern: Wide base of support;Step-to pattern Gait velocity: slow     PT Goals (current goals can now be found in the care plan section)    Visit Information  Last PT Received On: 10/17/12 Assistance  Needed: +2 History of Present Illness: Pt admitted for confusion.  From SNF wtih h/o cirrhosis, seizures.      Subjective Data      Cognition       Balance     End of Session PT - End of Session Equipment Utilized During Treatment: Gait belt Activity Tolerance: Patient tolerated treatment well Patient left: in bed;with call bell/phone within reach   Felecia Shelling  PTA [facility]  Acute  Rehab Pager      [phone]

## 2012-10-17 NOTE — Progress Notes (Signed)
Occupational Therapy Treatment Patient Details Name: Tony Boyd MRN: [id number] DOB: 1955/10/03 Today's Date: 10/17/2012 Time: 5284-1324 OT Time Calculation (min): 24 min  OT Assessment / Plan / Recommendation  History of present illness Pt admitted for confusion.  From SNF wtih h/o cirrhosis, seizures.     OT comments  All adls done this am.  Improving with mobility and tolerating hammock for scrotom.    Follow Up Recommendations  SNF    Barriers to Discharge       Equipment Recommendations  None recommended by OT    Recommendations for Other Services    Frequency Min 2X/week   Progress towards OT Goals Progress towards OT goals: Progressing toward goals  Plan Discharge plan remains appropriate    Precautions / Restrictions Precautions Precautions: Fall Restrictions Weight Bearing Restrictions: No   Pertinent Vitals/Pain Sore in scrotom:  Adjusted strap and repositioned pt in chair    ADL  Toilet Transfer: Simulated;Minimal assistance Toilet Transfer Method: Stand pivot (bed to chair) Equipment Used: Rolling walker Transfers/Ambulation Related to ADLs: cues for safety with transfer.  Pt did not need to use bathroom during this session:  requested wide commode from [employer]ables ADL Comments: all adls were done this am.  Adjusted scrotal sling for transfer then loosened when sitting in chair.  pt reports this is helping.      OT Diagnosis:    OT Problem List:   OT Treatment Interventions:     OT Goals(current goals can now be found in the care plan section) Acute Rehab OT Goals Time For Goal Achievement: 10/28/12  Visit Information  Last OT Received On: 10/17/12 Assistance Needed: +2 (+1 SPT) History of Present Illness: Pt admitted for confusion.  From SNF wtih h/o cirrhosis, seizures.      Subjective Data      Prior Functioning       Cognition  Cognition Arousal/Alertness: Awake/alert Behavior During Therapy: WFL for tasks assessed/performed Overall  Cognitive Status: Within Functional Limits for tasks assessed (cues for safety)    Mobility  Bed Mobility Supine to Sit: 4: Min guard;With rails;HOB elevated Details for Bed Mobility Assistance: assist for lines, increased time, cues to breathe.  Transfers Sit to Stand: 5: Supervision;From bed;From elevated surface;With upper extremity assist    Exercises      Balance Balance Balance Assessed: Yes Static Standing Balance Static Standing - Balance Support: Bilateral upper extremity supported Static Standing - Level of Assistance: 6: Modified independent (Device/Increase time) Static Standing - Comment/# of Minutes: 2   End of Session OT - End of Session Activity Tolerance: Patient tolerated treatment well Patient left: in bed;with call bell/phone within reach  GO     [facility] 10/17/2012, 10:51 AM Marica Otter, OTR/L [phone] 10/17/2012

## 2012-10-17 NOTE — Discharge Summary (Signed)
Physician Discharge Summary  Tony Boyd WUJ:[id number] DOB: 24-Jun-1955 DOA: 10/12/2012  PCP: [name], DO  Admit date: 10/12/2012 Discharge date: 10/17/2012  Time spent: 45 minutes  Recommendations for Outpatient Follow-up:  1. Patient discharged to [facility] for further active diuresis with IV Lasix 2. Recommend CBC to be met daily for thrombocytopenia, creat and sodium 3. Potentially may need to taper back risperidone if hyponatremia does not resolve 4. Continue fluid restriction 5. May need sliding scale to cover blood sugars  Discharge Diagnoses:  Principal Problem:   Hepatic encephalopathy Active Problems:   ETOH abuse   Hepatitis C   Coagulopathy   Hyponatremia   Cirrhosis   Esophageal varices without mention of bleeding   Seizure disorder   Thrombocytopenia, acquired   Hypothyroidism   Diabetes mellitus   Anasarca   Scrotal swelling   Anemia of chronic disease   Moderate protein-calorie malnutrition   Weakness generalized   Discharge Condition: Fair  Diet recommendation: Low-salt  Filed Weights   10/12/12 1547 10/14/12 0517  Weight: 144.5 kg (318 lb 9 oz) 140.7 kg (310 lb 3 oz)    History of present illness:  57 year old alcoholic, history of cirrhosis [MELD score 31] , hepatitis C, diabetes, varices, bipolar and recent hospitalization 8/11-8/15 was readmitted 10/12/2012 with generalized confusion found to have elevated ammonia level. He was found to have anasarca as well and scrotal erythema with excoeriation and bleeding. An attemtp was made 8/20 at paracentesis, but no fluid was found to draw off. His weight has been as low as 124 kg and on admission was 147 kilograms.   Hospital Course:  #1 acute hepatic encephalopathy-resolved   Ammonia level elevated at 156 on admission-could have been secondary to him getting lactulose less than 5 times a day which is his usual-note that he is currently back on that dose of lactulose. Chest x-ray ws  negative  Urinalysis >100,000 multiple coliform units-sided buttocks discontinued.  Continue Xifaxan. #2 hyponatremia  Likely secondary to hypervolemic hyponatremia secondary to cirrhosis. Urine osm 336. Fena useless as was on diuretics prior to admit May consider Inderal secondary to hx of esophageal varices closer to discharge from acute facility-discussed briefly with Dr. Kathrene Bongo for nephrology on 10/17/2012 and she recommended continued diuresis #4 anasarca/scrotal swelling  Likely secondary to cirrhosis. Chest x-ray consistent with pulmonary edema. Continue Lasix 80 mg IV every 6 hours. Output overnight was - 5.2 liters Continue spironolactone 200mg  Urine Na/K-0.4-may eventually be transitioned in about 4-5 days to by mouth Lasix-scrotal swelling is much improved but he still has significant anasarca and lower extremities-it was attempted to paracentes him on 8/21 there was not enough fluid do this #5 thrombocytopenia  Chronic in nature. Likely secondary to cirrhosis. Monitor platelet counts. Follow. Transfuse if platelet counts less than 10,000 or active bleeding. #6 diabetes mellitus  Hemoglobin A1c was 5.3 on 10/06/2012. Continue sliding scale insulin. Sugars mod controlled 158-229  #7 hypothyroidism  TSH within normal limits of 2.243 on 09/17/2012. Continue Synthroid 75 MCG daily, Rpt TSH in 6 weeks  #8 seizure disorder, likely ETOh assosc  Continue Keppra orally 500 mg bid  #9 protein calorie malnutrition  Secondary to cirrhosis. Follow.  #10 macrocytic anemia  Likely secondary to history of alcohol abuse. No overt GI bleed. Follow H&H.  #11H/o Grade 3 Varices-Will re-implment inderal this admit  #12 prophylaxis  PPI for GI prophylaxis. SCDs for DVT prophylaxis.   Consultants:  radiology Procedures:  Paracentesis scheduled for 10/15/2012 Antibiotics:  Rifaximin chronically  Admission 8/11 with scrotal swelling and discomfort  Admission 7/22 with confusion, ammonia  bedtime 160, had some chronic hyponatremia as well  Admission 08/21/2012 with hepatic encephalopathy, potential urinary tract infection with coagulase negative staph and had a seizure at the time secondary to noncompliance  Admission 08/08/12 with hepatic encephalopathy -noncompliance on lactulose-psychiatry was consulted at the time and it was found he had capacity admission 06/01/2012 with syncopal episode  admission 03/20/2014with confusion and change in mental status  Endoscopy 05/06/2012 =grade 3 varices  Admission 04/16/2012 with hyponatremia and alcohol abuse  Admission 01/23/2012 encephalopathy  Admission 10/16/2011 hepatic encephalopathy, confusion  Admission 04/18/2011 confusion  Admission 10/09/2010 hepatic encephalopathy  Admission 7/15/2012confusion and adult onset seizures likely secondary to alcohol and BZD withdrawal, right lower lobe pneumonia  Admission 07/12/10 for Abd abcess s/p Perc drain  Admission 11/02/07 for AMS  Admission 07/27/07 for delirium  Discharge Exam: Filed Vitals:   10/17/12 0612  BP: 135/67  Pulse: 98  Temp: 98.1 F (36.7 C)  Resp: 20   Alert pleasant comfortable, NAD  General: Obese, EOMI Cardiovascular: s1 s2 no m/r/g Respiratory: clear  Discharge Instructions  Discharge Orders   Future Appointments Provider Department Dept Phone   11/03/2012 11:00 AM Kermit Balo, DO [facility] [phone]   Future Orders Complete By Expires   Diet - low sodium heart healthy  As directed    Discharge instructions  As directed    Comments:     You were cared for by a hospitalist during your hospital stay. If you have any questions about your discharge medications or the care you received while you were in the hospital after you are discharged, you can call the unit and asked to speak with the hospitalist on call if the hospitalist that took care of you is not available. Once you are discharged, your primary care physician will handle any further  medical issues. Please note that NO REFILLS for any discharge medications will be authorized once you are discharged, as it is imperative that you return to your primary care physician (or establish a relationship with a primary care physician if you do not have one) for your aftercare needs so that they can reassess your need for medications and monitor your lab values. If you do not have a primary care physician, you can call [phone] for a physician referral.   Increase activity slowly  As directed        Medication List    STOP taking these medications       furosemide 80 MG tablet  Commonly known as:  LASIX  Replaced by:  furosemide 10 MG/ML injection      TAKE these medications       ALPRAZolam 1 MG tablet  Commonly known as:  XANAX  Take 1 tablet (1 mg total) by mouth at bedtime as needed for sleep.     bisacodyl 10 MG suppository  Commonly known as:  DULCOLAX  Place 10 mg rectally as needed for constipation.     ferrous sulfate 325 (65 FE) MG tablet  Take 325 mg by mouth at bedtime.     folic acid 1 MG tablet  Commonly known as:  FOLVITE  Take 1 mg by mouth daily.     furosemide 10 MG/ML injection  Commonly known as:  LASIX  Inject 8 mLs (80 mg total) into the vein every 6 (six) hours.     insulin aspart 100 UNIT/ML injection  Commonly known as:  novoLOG  Inject 6 Units into the skin 3 (three) times daily with meals.     insulin glargine 100 UNIT/ML injection  Commonly known as:  LANTUS  Inject 20 Units into the skin at bedtime.     lactulose 10 GM/15ML solution  Commonly known as:  CHRONULAC  Take 30 g by mouth 5 (five) times daily.     levETIRAcetam 500 MG tablet  Commonly known as:  KEPPRA  Take 500 mg by mouth 2 (two) times daily.     levothyroxine 75 MCG tablet  Commonly known as:  SYNTHROID, LEVOTHROID  Take 75 mcg by mouth daily.     omeprazole 20 MG capsule  Commonly known as:  PRILOSEC  Take 20 mg by mouth daily.     Oxycodone HCl 10 MG  Tabs  Take 1 tablet (10 mg total) by mouth every 4 (four) hours.     polyethylene glycol packet  Commonly known as:  MIRALAX / GLYCOLAX  Take 17 g by mouth daily.     potassium chloride SA 20 MEQ tablet  Commonly known as:  K-DUR,KLOR-CON  Take 20 mEq by mouth 3 (three) times daily.     rifaximin 550 MG Tabs tablet  Commonly known as:  XIFAXAN  Take 550 mg by mouth 2 (two) times daily.     risperiDONE 0.25 MG tablet  Commonly known as:  RISPERDAL  Take 0.25 mg by mouth 2 (two) times daily.     spironolactone 100 MG tablet  Commonly known as:  ALDACTONE  Take 100 mg by mouth daily.       Allergies  Allergen Reactions  . Ativan [Lorazepam] Other (See Comments)    "hallucinations"  . Droperidol Other (See Comments)    unknown  . Ketorolac Other (See Comments)    unknown  . Penicillins Swelling and Other (See Comments)    unkown  . Toradol [Ketorolac Tromethamine] Hives      The results of significant diagnostics from this hospitalization (including imaging, microbiology, ancillary and laboratory) are listed below for reference.    Significant Diagnostic Studies: US Abdomen Complete  10/12/2012   *RADIOLOGY REPORT*  Clinical Data:  Cirrhosis, question ascites.  COMPLETE ABDOMINAL ULTRASOUND  Comparison:  10/06/2012  Findings:  Gallbladder:  Prior cholecystectomy.  Common bile duct:   Normal caliber, 5 mm.  Liver:  Heterogeneous echotexture with nodular contours compatible with history of cirrhosis.  1.5 cm hypodense area within the right lobe anteriorly, likely small cyst.  IVC:  Appears normal.  Pancreas:  Unable to visualize due to body habitus and overlying bowel gas.  Spleen:  Enlarged with a length of 13.7 cm and a volume of 808 ml. No focal abnormality.  Right Kidney:   Normal in size and parenchymal echogenicity.  No evidence of mass or hydronephrosis.  Left Kidney:  4.1 cm simple appearing cyst off the upper pole.  No hydronephrosis.  Normal echotexture.  Abdominal  aorta:  No aneurysm.  Small amount of ascites in the lower abdomen bilaterally  IMPRESSION: Changes of cirrhosis.  Associated splenomegaly with a small amount of pelvic ascites.   Original Report Authenticated By: Charlett Nose, M.D.   US Scrotum  10/06/2012   *RADIOLOGY REPORT*  Clinical Data:  Scrotal pain and swelling  SCROTAL ULTRASOUND DOPPLER ULTRASOUND OF THE TESTICLES  Technique: Complete ultrasound examination of the testicles, epididymis, and other scrotal structures was performed.  Color and spectral Doppler ultrasound were also utilized to evaluate blood flow to the testicles.  Comparison:  None  Findings:  Right testis:  The right testicle measures 2.7 x 2.1 x 1.7 cm. Color Doppler flow is demonstrated.  Venous waveforms are present. Arterial wave forms are not demonstrated, potentially secondary to difficulty visualizing the testicle from extensive scrotal swelling.  Left testis:  The left testicle measures 4.0 x 3.2 x 2.8 cm.  Color Doppler flow is present.  Venous waveforms are present.  Arterial wave forms are not demonstrated, potentially secondary to difficulty visualizing the testicle from extensive scrotal swelling  Right epididymis:  Normal in size in appearance measuring 1.4 x 1.0 x 1.3 cm  Left epididymis:  Normal in size and appearance measuring 1.3 x 0.8 x 1.2 cm.  Hydrocele:  Small left hydrocele.  Varicocele:  Absent  Pulsed Doppler interrogation of both testes demonstrates normal venous waveforms.  Arterial wave forms are not able to be demonstrated, potentially secondary to extensive scrotal soft tissue swelling.  IMPRESSION: 1.  Marked soft tissue swelling/edema of the scrotum.  This is nonspecific and may be secondary to third spacing of fluid from history of cirrhosis or infection.  2. Note arterial waveforms are not able to be demonstrated in either testicle likely secondary to marked swelling of the scrotum. Color doppler flow is present.  Discussed with PA Pisciotta at 01:45 p.m.  on 08/02 01/15/2013.   Original Report Authenticated By: Annia Belt, M.D   US Abdomen Limited  10/15/2012   CLINICAL DATA:  Evaluate for ascites.  EXAM: LIMITED ABDOMEN ULTRASOUND FOR ASCITES  TECHNIQUE: Limited ultrasound survey for ascites was performed in all four abdominal quadrants.  COMPARISON:  10/12/2012  FINDINGS: 4 quadrant limited abdominal ultrasound was performed to assess for ascites. This again shows a small amount of pelvic ascites, not adequate to tap at this time.  IMPRESSION: Small amount of pelvic ascites, not adequate to tap.   Electronically Signed   By: Charlett Nose   On: 10/15/2012 13:27   US Abdomen Limited  10/06/2012   *RADIOLOGY REPORT*  Limited abdominal ultrasound  History:  Abdominal distension  Findings:  No demonstrable ascites.  There is peristalsing gas throughout the abdomen in all quadrants.  Conclusion:  No demonstrable ascites.   Original Report Authenticated By: Bretta Bang, M.D.   [place] Art/ven Flow Abd Pelv Doppler  10/06/2012   *RADIOLOGY REPORT*  Clinical Data:  Scrotal pain and swelling  SCROTAL ULTRASOUND DOPPLER ULTRASOUND OF THE TESTICLES  Technique: Complete ultrasound examination of the testicles, epididymis, and other scrotal structures was performed.  Color and spectral Doppler ultrasound were also utilized to evaluate blood flow to the testicles.  Comparison:  None  Findings:  Right testis:  The right testicle measures 2.7 x 2.1 x 1.7 cm. Color Doppler flow is demonstrated.  Venous waveforms are present. Arterial wave forms are not demonstrated, potentially secondary to difficulty visualizing the testicle from extensive scrotal swelling.  Left testis:  The left testicle measures 4.0 x 3.2 x 2.8 cm.  Color Doppler flow is present.  Venous waveforms are present.  Arterial wave forms are not demonstrated, potentially secondary to difficulty visualizing the testicle from extensive scrotal swelling  Right epididymis:  Normal in size in appearance measuring  1.4 x 1.0 x 1.3 cm  Left epididymis:  Normal in size and appearance measuring 1.3 x 0.8 x 1.2 cm.  Hydrocele:  Small left hydrocele.  Varicocele:  Absent  Pulsed Doppler interrogation of both testes demonstrates normal venous waveforms.  Arterial wave forms are not able to be demonstrated, potentially secondary  to extensive scrotal soft tissue swelling.  IMPRESSION: 1.  Marked soft tissue swelling/edema of the scrotum.  This is nonspecific and may be secondary to third spacing of fluid from history of cirrhosis or infection.  2. Note arterial waveforms are not able to be demonstrated in either testicle likely secondary to marked swelling of the scrotum. Color doppler flow is present.  Discussed with PA Pisciotta at 01:45 p.m. on 08/02 01/15/2013.   Original Report Authenticated By: Annia Belt, M.D   Ir Fluoro Guide Cv Line Right  10/13/2012   * RADIOLOGY REPORT *  Clinical data: Cirrhosis, poor IV access, need venous access.  PICC PLACEMENT WITH ULTRASOUND AND FLUOROSCOPY  Technique: After written informed consent was obtained, patient was placed in the supine position on angiographic table. Patency of the right brachial vein was confirmed with ultrasound with image documentation. An appropriate skin site was determined. Skin site was marked. Region was prepped using maximum barrier technique including cap and mask, sterile gown, sterile gloves, large sterile sheet, and Chlorhexidine   as cutaneous antisepsis.  The region was infiltrated locally with 1% lidocaine.   Under real-time ultrasound guidance, the right brachial vein was accessed with a 21 gauge micropuncture needle; the needle tip within the vein was confirmed with ultrasound image documentation.   Needle exchanged over a 018 guidewire for a peel-away sheath, through which a 5-French double- lumen power injectable PICC trimmed to 42cm was advanced, positioned with its tip near the cavoatrial junction.  Spot chest radiograph confirms appropriate catheter  position.  Catheter was flushed per protocol and secured externally with 0-Prolene sutures. The patient tolerated procedure well, with no immediate complication.  Fluoroscopy time: 5 seconds  IMPRESSION: Technically successful five [place] double lumen power injectable and PICC placement   Original Report Authenticated By: D. Andria Rhein, MD   Ir US Guide Vasc Access Right  10/13/2012   * RADIOLOGY REPORT *  Clinical data: Cirrhosis, poor IV access, need venous access.  PICC PLACEMENT WITH ULTRASOUND AND FLUOROSCOPY  Technique: After written informed consent was obtained, patient was placed in the supine position on angiographic table. Patency of the right brachial vein was confirmed with ultrasound with image documentation. An appropriate skin site was determined. Skin site was marked. Region was prepped using maximum barrier technique including cap and mask, sterile gown, sterile gloves, large sterile sheet, and Chlorhexidine   as cutaneous antisepsis.  The region was infiltrated locally with 1% lidocaine.   Under real-time ultrasound guidance, the right brachial vein was accessed with a 21 gauge micropuncture needle; the needle tip within the vein was confirmed with ultrasound image documentation.   Needle exchanged over a 018 guidewire for a peel-away sheath, through which a 5-French double- lumen power injectable PICC trimmed to 42cm was advanced, positioned with its tip near the cavoatrial junction.  Spot chest radiograph confirms appropriate catheter position.  Catheter was flushed per protocol and secured externally with 0-Prolene sutures. The patient tolerated procedure well, with no immediate complication.  Fluoroscopy time: 5 seconds  IMPRESSION: Technically successful five [place] double lumen power injectable and PICC placement   Original Report Authenticated By: D. Andria Rhein, MD   Dg Chest Portable 1 View  10/12/2012   *RADIOLOGY REPORT*  Clinical Data: Altered mental status, shortness of breath   PORTABLE CHEST - 1 VIEW  Comparison: 08/21/2012 and 09/16/2012  Findings: Borderline cardiomegaly noted.  There is central vascular congestion.  Mild perihilar interstitial prominence bilaterally suspicious for mild interstitial edema.  No  segmental infiltrate.  IMPRESSION: Central vascular congestion and mild interstitial prominence centrally suspicious for mild interstitial edema.  No segmental infiltrate.   Original Report Authenticated By: Natasha Mead, M.D.    Microbiology: Recent Results (from the past 240 hour(s))  URINE CULTURE     Status: None   Collection Time    10/12/12 11:45 AM      Result Value Range Status   Specimen Description URINE, CATHETERIZED   Final   Special Requests NONE   Final   Culture  Setup Time     Final   Value: 10/12/2012 17:37     Performed at [facility] Count     Final   Value: >=100,000 COLONIES/ML     Performed at [facility]   Culture     Final   Value: Multiple bacterial morphotypes present, none predominant. Suggest appropriate recollection if clinically indicated.     Performed at [facility]   Report Status 10/13/2012 FINAL   Final  MRSA PCR SCREENING     Status: None   Collection Time    10/12/12  3:54 PM      Result Value Range Status   MRSA by PCR NEGATIVE  NEGATIVE Final   Comment:            The GeneXpert MRSA Assay (FDA     approved for NASAL specimens     only), is one component of a     comprehensive MRSA colonization     surveillance program. It is not     intended to diagnose MRSA     infection nor to guide or     monitor treatment for     MRSA infections.     Performed at [facility]     Labs: Basic Metabolic Panel:  Recent Labs Lab 10/12/12 1230 10/14/12 0444 10/15/12 0455 10/16/12 0525 10/17/12 0530  NA 129* 129* 128* 125* 122*  K 3.7 3.8 3.7 3.7 3.9  CL 92* 92* 92* 90* 88*  CO2 30 34* 33* 32 30  GLUCOSE 198* 221* 209* 225* 175*  BUN 14 17 14 13 11   CREATININE 0.74  0.89 0.81 0.75 0.74  CALCIUM 8.0* 7.6* 7.7* 7.5* 8.0*  MG 1.6  --   --   --   --    Liver Function Tests:  Recent Labs Lab 10/12/12 1230 10/14/12 0444 10/15/12 0455 10/16/12 0525 10/17/12 0530  AST 42* 37 42* 41* 51*  ALT 26 21 24 23 28   ALKPHOS 233* 202* 227* 209* 235*  BILITOT 3.4* 3.6* 2.8* 2.9* 3.0*  PROT 5.8* 5.0* 5.3* 5.0* 5.6*  ALBUMIN 1.8* 1.6* 1.7* 1.5* 1.8*   No results found for this basename: LIPASE, AMYLASE,  in the last 168 hours  Recent Labs Lab 10/12/12 1230  AMMONIA 156*   CBC:  Recent Labs Lab 10/12/12 1230 10/14/12 0444 10/15/12 0455 10/16/12 0525  WBC 6.9 5.4 5.9 5.0  NEUTROABS 5.3  --   --   --   HGB 11.2* 9.3* 10.0* 9.2*  HCT 32.2* 27.3* 28.4* 26.1*  MCV 103.5* 105.0* 104.0* 103.2*  PLT 28* 50* 52* 48*   Cardiac Enzymes: No results found for this basename: CKTOTAL, CKMB, CKMBINDEX, TROPONINI,  in the last 168 hours BNP: BNP (last 3 results) No results found for this basename: PROBNP,  in the last 8760 hours CBG:  Recent Labs Lab 10/16/12 0813 10/16/12 1708 10/16/12 2109 10/17/12 0740 10/17/12 1151  GLUCAP 178* 253*  205* 158* 229*       Signed:  Rhetta Mura  [facility] 10/17/2012, 3:03 PM

## 2012-10-17 NOTE — Progress Notes (Signed)
Note patient was accepted to [facility]. Tammy @ [facility] made aware. CSW signing off.   Unice Bailey, LCSW [facility] Clinical Social Worker cell #: [phone]

## 2012-10-27 NOTE — Consult Note (Signed)
I have reviewed and discussed the care of this patient in detail with the nurse practitioner including pertinent patient records, physical exam findings and data. I agree with details of this encounter.  

## 2012-10-30 ENCOUNTER — Other Ambulatory Visit: Payer: Self-pay | Admitting: Adult Health

## 2012-10-31 ENCOUNTER — Other Ambulatory Visit: Payer: Self-pay | Admitting: *Deleted

## 2012-10-31 MED ORDER — ALPRAZOLAM 1 MG PO TABS: 1.0000 mg | ORAL_TABLET | Freq: Every evening | ORAL | Status: DC | PRN

## 2012-10-31 MED ORDER — OXYCODONE HCL 10 MG PO TABS: 10.0000 mg | ORAL_TABLET | ORAL | Status: DC

## 2012-11-03 ENCOUNTER — Ambulatory Visit: Payer: Medicare Other | Admitting: Internal Medicine

## 2012-11-11 ENCOUNTER — Inpatient Hospital Stay (HOSPITAL_COMMUNITY)
Admission: EM | Admit: 2012-11-11 | Discharge: 2012-11-14 | DRG: 442 | Disposition: A | Discharge status: Home Health | Payer: Medicare Other | Attending: Internal Medicine | Admitting: Internal Medicine

## 2012-11-11 ENCOUNTER — Telehealth: Payer: Self-pay | Admitting: *Deleted

## 2012-11-11 ENCOUNTER — Encounter (HOSPITAL_COMMUNITY): Payer: Self-pay

## 2012-11-11 DIAGNOSIS — E876 Hypokalemia: Secondary | ICD-10-CM

## 2012-11-11 DIAGNOSIS — B182 Chronic viral hepatitis C: Principal | ICD-10-CM | POA: Diagnosis present

## 2012-11-11 DIAGNOSIS — R531 Weakness: Secondary | ICD-10-CM

## 2012-11-11 DIAGNOSIS — D689 Coagulation defect, unspecified: Secondary | ICD-10-CM

## 2012-11-11 DIAGNOSIS — Z91199 Patient's noncompliance with other medical treatment and regimen due to unspecified reason: Secondary | ICD-10-CM

## 2012-11-11 DIAGNOSIS — D638 Anemia in other chronic diseases classified elsewhere: Secondary | ICD-10-CM

## 2012-11-11 DIAGNOSIS — K7460 Unspecified cirrhosis of liver: Secondary | ICD-10-CM

## 2012-11-11 DIAGNOSIS — I8500 Esophageal varices without bleeding: Secondary | ICD-10-CM

## 2012-11-11 DIAGNOSIS — E440 Moderate protein-calorie malnutrition: Secondary | ICD-10-CM

## 2012-11-11 DIAGNOSIS — I739 Peripheral vascular disease, unspecified: Secondary | ICD-10-CM | POA: Diagnosis present

## 2012-11-11 DIAGNOSIS — E1142 Type 2 diabetes mellitus with diabetic polyneuropathy: Secondary | ICD-10-CM | POA: Diagnosis present

## 2012-11-11 DIAGNOSIS — K7290 Hepatic failure, unspecified without coma: Secondary | ICD-10-CM | POA: Diagnosis present

## 2012-11-11 DIAGNOSIS — R609 Edema, unspecified: Secondary | ICD-10-CM | POA: Diagnosis present

## 2012-11-11 DIAGNOSIS — Z515 Encounter for palliative care: Secondary | ICD-10-CM

## 2012-11-11 DIAGNOSIS — D696 Thrombocytopenia, unspecified: Secondary | ICD-10-CM

## 2012-11-11 DIAGNOSIS — K7682 Hepatic encephalopathy: Secondary | ICD-10-CM

## 2012-11-11 DIAGNOSIS — D61818 Other pancytopenia: Secondary | ICD-10-CM | POA: Diagnosis present

## 2012-11-11 DIAGNOSIS — E871 Hypo-osmolality and hyponatremia: Secondary | ICD-10-CM

## 2012-11-11 DIAGNOSIS — K219 Gastro-esophageal reflux disease without esophagitis: Secondary | ICD-10-CM | POA: Diagnosis present

## 2012-11-11 DIAGNOSIS — E119 Type 2 diabetes mellitus without complications: Secondary | ICD-10-CM

## 2012-11-11 DIAGNOSIS — F319 Bipolar disorder, unspecified: Secondary | ICD-10-CM | POA: Diagnosis present

## 2012-11-11 DIAGNOSIS — N5089 Other specified disorders of the male genital organs: Secondary | ICD-10-CM

## 2012-11-11 DIAGNOSIS — K769 Liver disease, unspecified: Secondary | ICD-10-CM | POA: Diagnosis present

## 2012-11-11 DIAGNOSIS — Z79899 Other long term (current) drug therapy: Secondary | ICD-10-CM

## 2012-11-11 DIAGNOSIS — G40909 Epilepsy, unspecified, not intractable, without status epilepticus: Secondary | ICD-10-CM

## 2012-11-11 DIAGNOSIS — R601 Generalized edema: Secondary | ICD-10-CM

## 2012-11-11 DIAGNOSIS — B1920 Unspecified viral hepatitis C without hepatic coma: Secondary | ICD-10-CM

## 2012-11-11 DIAGNOSIS — Z9119 Patient's noncompliance with other medical treatment and regimen: Secondary | ICD-10-CM

## 2012-11-11 DIAGNOSIS — E039 Hypothyroidism, unspecified: Secondary | ICD-10-CM

## 2012-11-11 DIAGNOSIS — E1149 Type 2 diabetes mellitus with other diabetic neurological complication: Secondary | ICD-10-CM | POA: Diagnosis present

## 2012-11-11 DIAGNOSIS — Z794 Long term (current) use of insulin: Secondary | ICD-10-CM

## 2012-11-11 DIAGNOSIS — F1010 Alcohol abuse, uncomplicated: Secondary | ICD-10-CM

## 2012-11-11 DIAGNOSIS — Z87891 Personal history of nicotine dependence: Secondary | ICD-10-CM

## 2012-11-11 DIAGNOSIS — E1165 Type 2 diabetes mellitus with hyperglycemia: Secondary | ICD-10-CM | POA: Diagnosis present

## 2012-11-11 DIAGNOSIS — I8510 Secondary esophageal varices without bleeding: Secondary | ICD-10-CM | POA: Diagnosis present

## 2012-11-11 NOTE — Telephone Encounter (Signed)
[facility] called and wanted verbal orders to continue therapy. Oked the orders.

## 2012-11-11 NOTE — ED Notes (Signed)
Pt from [facility] experiencing acute confusion since 3pm today per staff. Staff saying they saw pt going to use bathroom in wrong wrong. EMS reports that pt is alert and oriented. Recent symptom alerted an increase in ammonia levels. Some confusion normal reported by EMS by staff at facility.

## 2012-11-11 NOTE — ED Notes (Signed)
Bed: WA21 Expected date: 11/11/12 Expected time: 11:02 PM Means of arrival: Ambulance Comments: Confusion

## 2012-11-12 ENCOUNTER — Encounter (HOSPITAL_COMMUNITY): Payer: Self-pay

## 2012-11-12 ENCOUNTER — Emergency Department (HOSPITAL_COMMUNITY): Payer: Medicare Other

## 2012-11-12 DIAGNOSIS — K7460 Unspecified cirrhosis of liver: Secondary | ICD-10-CM

## 2012-11-12 DIAGNOSIS — K7682 Hepatic encephalopathy: Secondary | ICD-10-CM

## 2012-11-12 DIAGNOSIS — E119 Type 2 diabetes mellitus without complications: Secondary | ICD-10-CM

## 2012-11-12 DIAGNOSIS — R609 Edema, unspecified: Secondary | ICD-10-CM

## 2012-11-12 DIAGNOSIS — E871 Hypo-osmolality and hyponatremia: Secondary | ICD-10-CM

## 2012-11-12 DIAGNOSIS — K7290 Hepatic failure, unspecified without coma: Secondary | ICD-10-CM

## 2012-11-12 DIAGNOSIS — E039 Hypothyroidism, unspecified: Secondary | ICD-10-CM

## 2012-11-12 LAB — COMPREHENSIVE METABOLIC PANEL
ALT: 26 U/L (ref 0–53)
AST: 55 U/L — ABNORMAL HIGH (ref 0–37)
CO2: 31 mEq/L (ref 19–32)
Calcium: 8.2 mg/dL — ABNORMAL LOW (ref 8.4–10.5)
GFR calc non Af Amer: >90 mL/min (ref >90)
Sodium: 127 mEq/L — ABNORMAL LOW (ref 135–145)

## 2012-11-12 LAB — URINALYSIS, ROUTINE W REFLEX MICROSCOPIC
Bilirubin Urine: NEGATIVE
Hgb urine dipstick: NEGATIVE
Specific Gravity, Urine: 1.011 (ref 1.005–1.030)
Urobilinogen, UA: 1 mg/dL (ref 0.0–1.0)
pH: 6.5 (ref 5.0–8.0)

## 2012-11-12 LAB — AMMONIA: Ammonia: 104 umol/L — ABNORMAL HIGH (ref 11–60)

## 2012-11-12 LAB — GLUCOSE, CAPILLARY: Glucose-Capillary: 157 mg/dL — ABNORMAL HIGH (ref 70–99)

## 2012-11-12 LAB — CBC
MCH: 34.8 pg — ABNORMAL HIGH (ref 26.0–34.0)
Platelets: 46 10*3/uL — ABNORMAL LOW (ref 150–400)
RBC: 2.96 MIL/uL — ABNORMAL LOW (ref 4.22–5.81)
WBC: 3.5 10*3/uL — ABNORMAL LOW (ref 4.0–10.5)

## 2012-11-12 LAB — POCT I-STAT TROPONIN I: Troponin i, poc: 0.02 ng/mL (ref 0.00–0.08)

## 2012-11-12 LAB — TSH: TSH: 3.47 u[IU]/mL (ref 0.350–4.500)

## 2012-11-12 MED ORDER — POTASSIUM CHLORIDE CRYS ER 10 MEQ PO TBCR
10.0000 meq | EXTENDED_RELEASE_TABLET | Freq: Every day | ORAL | Status: DC
  Administered 2012-11-12 – 2012-11-14 (×3): 10 meq via ORAL
  Filled 2012-11-12 (×3): qty 1

## 2012-11-12 MED ORDER — SPIRONOLACTONE 100 MG PO TABS
100.0000 mg | ORAL_TABLET | Freq: Every day | ORAL | Status: DC
  Administered 2012-11-12 – 2012-11-14 (×3): 100 mg via ORAL
  Filled 2012-11-12 (×3): qty 1

## 2012-11-12 MED ORDER — LEVOTHYROXINE SODIUM 75 MCG PO TABS
75.0000 ug | ORAL_TABLET | Freq: Every day | ORAL | Status: DC
  Administered 2012-11-12 – 2012-11-14 (×3): 75 ug via ORAL
  Filled 2012-11-12 (×5): qty 1

## 2012-11-12 MED ORDER — FUROSEMIDE 40 MG PO TABS
40.0000 mg | ORAL_TABLET | Freq: Two times a day (BID) | ORAL | Status: DC
  Administered 2012-11-12 – 2012-11-14 (×6): 40 mg via ORAL
  Filled 2012-11-12 (×8): qty 1

## 2012-11-12 MED ORDER — INSULIN ASPART 100 UNIT/ML ~~LOC~~ SOLN
0.0000 [IU] | Freq: Three times a day (TID) | SUBCUTANEOUS | Status: DC
  Administered 2012-11-12 (×2): 3 [IU] via SUBCUTANEOUS
  Administered 2012-11-12: 09:00:00 2 [IU] via SUBCUTANEOUS
  Administered 2012-11-13: 18:00:00 5 [IU] via SUBCUTANEOUS
  Administered 2012-11-13 (×2): 3 [IU] via SUBCUTANEOUS
  Administered 2012-11-14: 13:00:00 8 [IU] via SUBCUTANEOUS
  Administered 2012-11-14: 08:00:00 3 [IU] via SUBCUTANEOUS

## 2012-11-12 MED ORDER — POTASSIUM CHLORIDE CRYS ER 20 MEQ PO TBCR
20.0000 meq | EXTENDED_RELEASE_TABLET | Freq: Once | ORAL | Status: AC
  Administered 2012-11-12: 19:00:00 20 meq via ORAL
  Filled 2012-11-12: qty 1

## 2012-11-12 MED ORDER — OXYCODONE HCL 5 MG PO TABS
5.0000 mg | ORAL_TABLET | ORAL | Status: DC | PRN
  Administered 2012-11-12 – 2012-11-14 (×10): 5 mg via ORAL
  Filled 2012-11-12 (×10): qty 1

## 2012-11-12 MED ORDER — INSULIN ASPART 100 UNIT/ML ~~LOC~~ SOLN: 0.0000 [IU] | Freq: Every day | SUBCUTANEOUS | Status: DC

## 2012-11-12 MED ORDER — RISPERIDONE 0.25 MG PO TABS
0.2500 mg | ORAL_TABLET | Freq: Two times a day (BID) | ORAL | Status: DC
  Administered 2012-11-12 – 2012-11-14 (×5): 0.25 mg via ORAL
  Filled 2012-11-12 (×6): qty 1

## 2012-11-12 MED ORDER — FERROUS SULFATE 325 (65 FE) MG PO TABS
325.0000 mg | ORAL_TABLET | Freq: Every day | ORAL | Status: DC
  Administered 2012-11-12 – 2012-11-13 (×2): 325 mg via ORAL
  Filled 2012-11-12 (×3): qty 1

## 2012-11-12 MED ORDER — PANTOPRAZOLE SODIUM 40 MG PO TBEC
40.0000 mg | DELAYED_RELEASE_TABLET | Freq: Every day | ORAL | Status: DC
  Administered 2012-11-13 – 2012-11-14 (×2): 40 mg via ORAL
  Filled 2012-11-12 (×2): qty 1

## 2012-11-12 MED ORDER — LEVETIRACETAM 500 MG PO TABS
500.0000 mg | ORAL_TABLET | Freq: Two times a day (BID) | ORAL | Status: DC
  Administered 2012-11-12 – 2012-11-14 (×5): 500 mg via ORAL
  Filled 2012-11-12 (×6): qty 1

## 2012-11-12 MED ORDER — INSULIN GLARGINE 100 UNIT/ML ~~LOC~~ SOLN
20.0000 [IU] | Freq: Every day | SUBCUTANEOUS | Status: DC
  Administered 2012-11-12 – 2012-11-13 (×2): 20 [IU] via SUBCUTANEOUS
  Filled 2012-11-12 (×3): qty 0.2

## 2012-11-12 MED ORDER — LACTULOSE 10 GM/15ML PO SOLN
30.0000 g | Freq: Once | ORAL | Status: AC
  Administered 2012-11-12: 30 g via ORAL
  Filled 2012-11-12: qty 45

## 2012-11-12 MED ORDER — FOLIC ACID 1 MG PO TABS
1.0000 mg | ORAL_TABLET | Freq: Every day | ORAL | Status: DC
  Administered 2012-11-12 – 2012-11-14 (×3): 1 mg via ORAL
  Filled 2012-11-12 (×3): qty 1

## 2012-11-12 MED ORDER — LACTULOSE 10 GM/15ML PO SOLN
20.0000 g | Freq: Three times a day (TID) | ORAL | Status: DC
  Administered 2012-11-12: 10:00:00 20 g via ORAL
  Filled 2012-11-12 (×3): qty 30

## 2012-11-12 MED ORDER — INFLUENZA VAC SPLIT QUAD 0.5 ML IM SUSP
0.5000 mL | INTRAMUSCULAR | Status: AC
  Administered 2012-11-13: 10:00:00 0.5 mL via INTRAMUSCULAR
  Filled 2012-11-12 (×2): qty 0.5

## 2012-11-12 MED ORDER — SODIUM CHLORIDE 0.9 % IJ SOLN
3.0000 mL | Freq: Two times a day (BID) | INTRAMUSCULAR | Status: DC
  Administered 2012-11-12 – 2012-11-14 (×5): 3 mL via INTRAVENOUS

## 2012-11-12 MED ORDER — LACTULOSE 10 GM/15ML PO SOLN
30.0000 g | Freq: Four times a day (QID) | ORAL | Status: DC
  Administered 2012-11-12 (×2): 30 g via ORAL
  Filled 2012-11-12 (×6): qty 45

## 2012-11-12 MED ORDER — RIFAXIMIN 550 MG PO TABS
550.0000 mg | ORAL_TABLET | Freq: Two times a day (BID) | ORAL | Status: DC
  Administered 2012-11-12 – 2012-11-14 (×5): 550 mg via ORAL
  Filled 2012-11-12 (×6): qty 1

## 2012-11-12 MED ORDER — OXYCODONE HCL 5 MG PO TABS
10.0000 mg | ORAL_TABLET | ORAL | Status: DC | PRN
  Administered 2012-11-12 (×2): 10 mg via ORAL
  Filled 2012-11-12 (×2): qty 2

## 2012-11-12 NOTE — ED Provider Notes (Signed)
CSN: [id number]     Arrival date & time 11/11/12  2319 History   First MD Initiated Contact with Patient 11/11/12 2356     Chief Complaint  Patient presents with  . Altered Mental Status   (Consider location/radiation/quality/duration/timing/severity/associated sxs/prior Treatment) Patient is a 57 y.o. male presenting with altered mental status. The history is provided by the patient and the EMS personnel. The history is limited by the condition of the patient.  Altered Mental Status Presenting symptoms: confusion   Severity:  Moderate Most recent episode:  Today Episode history:  Continuous Timing:  Constant Progression:  Worsening Chronicity:  Recurrent Context: not alcohol use, not dementia, not head injury and not homeless   Context comment:  Cirrhosis Associated symptoms: no abdominal pain and no fever     Past Medical History  Diagnosis Date  . Cirrhosis of liver   . Hepatitis C   . ETOH abuse   . Hypothyroid   . Psychosis   . Bipolar disorder   . Seizures   . Arthritis   . Back pain   . Coagulopathy     Hx of  . Thrombocytopenia     Hx of  . Pancytopenia     Hx of  . H/O hypokalemia   . Hyponatremia     Hx of  . Korsakoff psychosis   . H/O abdominal abscess   . H/O esophageal varices   . Metabolic encephalopathy   . Hepatic encephalopathy   . Urinary urgency   . H/O renal failure   . H/O ascites   . Altered mental status   . Anemia   . Ascites   . Shortness of breath   . Peripheral vascular disease   . GERD (gastroesophageal reflux disease)   . Thrombocytopenia, acquired 06/02/2012  . Unspecified hypothyroidism 06/02/2012  . Pain in joint, pelvic region and thigh   . Edema   . Obesity, unspecified   . Chest pain, unspecified   . Pneumonitis due to inhalation of food or vomitus   . Other convulsions   . Cellulitis and abscess of upper arm and forearm   . Chronic hepatitis C without mention of hepatic coma   . Hypopotassemia   . Anemia, unspecified    . Other and unspecified coagulation defects   . Thrombocytopenia, unspecified   . Bipolar I disorder, most recent episode (or current) unspecified   . Unspecified psychosis   . Encephalopathy   . Esophageal varices without mention of bleeding   . Esophagitis, unspecified   . Abscess of liver(572.0)   . Chronic kidney disease, unspecified   . Lumbago   . Personal history of alcoholism   . Personal history of tobacco use, presenting hazards to health   . Cirrhosis of liver without mention of alcohol   . Pneumonitis due to inhalation of food or vomitus 09/19/2010  . Chronic hepatitis C without mention of hepatic coma   . Bipolar I disorder, most recent episode (or current) unspecified   . Unspecified psychosis   . Other ascites   . Personal history of tobacco use, presenting hazards to health   . Cirrhosis of liver without mention of alcohol   . Type 2 diabetes mellitus with diabetic neuropathy    Past Surgical History  Procedure Laterality Date  . Colostomy    . Cholecystectomy    . Small intestine surgery    . Arm surgery      left arm  . US guided drain placement in ventral  hernia abscess  07/21/2010  . Multiple picc line placements    . Esophagogastroduodenoscopy  06/28/2011    Procedure: ESOPHAGOGASTRODUODENOSCOPY (EGD);  Surgeon: Louis Meckel, MD;  Location: [facility];  Service: Endoscopy;  Laterality: N/A;  . Esophagogastroduodenoscopy N/A 05/06/2012    Procedure: ESOPHAGOGASTRODUODENOSCOPY (EGD);  Surgeon: Louis Meckel, MD;  Location: [facility];  Service: Endoscopy;  Laterality: N/A;  . Esophageal banding N/A 05/06/2012    Procedure: ESOPHAGEAL BANDING;  Surgeon: Louis Meckel, MD;  Location: [facility];  Service: Endoscopy;  Laterality: N/A;   Family History  Problem Relation Age of Onset  . Heart disease Mother     MI  . Lung cancer Brother    History  Substance Use Topics  . Smoking status: Former Smoker    Types: Cigarettes    Quit date:  02/15/2008  . Smokeless tobacco: Never Used  . Alcohol Use: No    Review of Systems  Constitutional: Negative for fever.  Respiratory: Negative for cough and shortness of breath.   Gastrointestinal: Negative for abdominal pain.  Psychiatric/Behavioral: Positive for confusion.  All other systems reviewed and are negative.    Allergies  Ativan; Droperidol; Ketorolac; Penicillins; and Toradol  Home Medications   Current Outpatient Rx  Name  Route  Sig  Dispense  Refill  . ALPRAZolam (XANAX) 1 MG tablet   Oral   Take 1 tablet (1 mg total) by mouth at bedtime as needed for sleep.   30 tablet   5   . bisacodyl (DULCOLAX) 10 MG suppository   Rectal   Place 10 mg rectally as needed for constipation.         . ferrous sulfate 325 (65 FE) MG tablet   Oral   Take 325 mg by mouth at bedtime.         . folic acid (FOLVITE) 1 MG tablet   Oral   Take 1 mg by mouth daily.         . furosemide (LASIX) 10 MG/ML injection   Intravenous   Inject 8 mLs (80 mg total) into the vein every 6 (six) hours.   4 mL   0   . insulin aspart (NOVOLOG) 100 UNIT/ML injection   Subcutaneous   Inject 6 Units into the skin 3 (three) times daily with meals.         . insulin glargine (LANTUS) 100 UNIT/ML injection   Subcutaneous   Inject 20 Units into the skin at bedtime.         [name] lactulose (CHRONULAC) 10 GM/15ML solution   Oral   Take 30 g by mouth 5 (five) times daily.         [name] levETIRAcetam (KEPPRA) 500 MG tablet   Oral   Take 500 mg by mouth 2 (two) times daily.         [name] levothyroxine (SYNTHROID, LEVOTHROID) 75 MCG tablet   Oral   Take 75 mcg by mouth daily.         [name] omeprazole (PRILOSEC) 20 MG capsule   Oral   Take 20 mg by mouth daily.         . Oxycodone HCl 10 MG TABS   Oral   Take 1 tablet (10 mg total) by mouth every 4 (four) hours.   180 tablet   0   . polyethylene glycol (MIRALAX / GLYCOLAX) packet   Oral   Take 17 g by mouth daily.          [name]  potassium chloride SA (K-DUR,KLOR-CON) 20 MEQ tablet   Oral   Take 20 mEq by mouth 3 (three) times daily.         . rifaximin (XIFAXAN) 550 MG TABS   Oral   Take 550 mg by mouth 2 (two) times daily.         . risperiDONE (RISPERDAL) 0.25 MG tablet   Oral   Take 0.25 mg by mouth 2 (two) times daily.         [name] spironolactone (ALDACTONE) 100 MG tablet   Oral   Take 100 mg by mouth daily.          BP 115/66  Temp(Src) 98.3 F (36.8 C) (Oral)  Resp 17  SpO2 98% Physical Exam  Nursing note and vitals reviewed. Constitutional: He appears well-developed and well-nourished. No distress.  HENT:  Head: Normocephalic and atraumatic.  Mouth/Throat: No oropharyngeal exudate.  Eyes: EOM are normal. Pupils are equal, round, and reactive to light.  Neck: Normal range of motion. Neck supple.  Cardiovascular: Normal rate and regular rhythm.  Exam reveals no friction rub.   No murmur heard. Pulmonary/Chest: Effort normal and breath sounds normal. No respiratory distress. He has no wheezes. He has no rales.  Abdominal: He exhibits distension. There is no tenderness. There is no rebound.  Musculoskeletal: Normal range of motion. He exhibits no edema.  Neurological: He is alert.  Oriented to self. Intermittently following commands  Skin: He is not diaphoretic.    ED Course  Procedures (including critical care time) Labs Review Labs Reviewed  GLUCOSE, CAPILLARY - Abnormal; Notable for the following:    Glucose-Capillary 159 (*)    All other components within normal limits  CBC  COMPREHENSIVE METABOLIC PANEL  URINALYSIS, ROUTINE W REFLEX MICROSCOPIC  AMMONIA   Imaging Review No results found.  Angiocath insertion Performed by: Dagmar Hait  Consent: Verbal consent obtained. Risks and benefits: risks, benefits and alternatives were discussed Time out: Immediately prior to procedure a "time out" was called to verify the correct patient, procedure, equipment,  support staff and site/side marked as required.  Preparation: Patient was prepped and draped in the usual sterile fashion.  Vein Location: L AC  Yes Ultrasound Guided  Gauge: 20  Normal blood return and flush without difficulty Patient tolerance: Patient tolerated the procedure well with no immediate complications.    MDM   1. Anasarca   2. Cirrhosis   3. Diabetes mellitus   4. Hepatic encephalopathy   5. Hyponatremia   6. Hypothyroidism    11M with hx of cirrhosis presents with confusion. Recent admission one month ago for hepatic encephalopathy. Intermittently following commands, moving all extremities. No falls reported. Unable to get history from patient. Labs show elevated ammonia, hyponatremia, pancytopenia, mild elevation in liver enzymes. C/w hepatic encephalopathy. Able to swallow, given lactulose. Stable for admission to medsurg floor bed.   I have reviewed all labs and imaging and considered them in my medical decision making.   Dagmar Hait, MD 11/12/12 [phone]

## 2012-11-12 NOTE — Progress Notes (Signed)
[facility] PROGRESS NOTE  Tony Boyd WUJ:[id number] DOB: April 22, 1955 DOA: 11/11/2012 PCP: [name], DO  Assessment/Plan: Hepatic encephalopathy  -Due to the patient noncompliance with lactulose  -Patient had been missing 2 days prior to admission -Gradually improving on lactulose in-house  -recheck ammonia -Continue rifaximin  -Change lactulose to 30 g every 4X daily   Diabetes mellitus type 2, uncontrolled  -Hemoglobin A1c 5.3  on 10/06/12  -hold lantus for now  -Continue NovoLog sliding scale while in hospital  -Long discussion with the patient today. He feels confident that he is able to manage his own insulin at home  Hyponatremia  -Secondary to patient's liver cirrhosis  -Continue diuresis with furosemide and spironolactone  -TSH--3.470 -Baseline sodium 124-129  Hypothyroidism - Continue Synthroid current dose - TSH--3.470 Thrombocytopenia/Coaguloapthy  -INR 1.8 on 10/15/12 -Secondary to patient's cirrhosis  -No active bleeding, clinically stable at this time  End-stage liver disease  -Patient is a not a candidate for transplantation at this time due to continued alcohol usage  Seizure disorder  -Continue Keppra  -No active seizures at this time  History of grade 3 esophageal varices - No signs of active bleeding at this time - Continue Protonix  Family Communication:   Pt at beside Disposition Plan:   SNF when medically stable       Procedures/Studies: Dg Chest 2 View  11/12/2012   CLINICAL DATA:  Altered mental status  EXAM: CHEST  2 VIEW  COMPARISON:  10/12/2012  FINDINGS: Chronic cardiopericardial enlargement. Upper mediastinal contours are unchanged. Unchanged diffuse interstitial prominence, with cephalized pulmonary blood flow. No focal opacity, definite effusion, or pneumothorax.  IMPRESSION: Cardiomegaly and pulmonary venous hypertension.   Electronically Signed   By: Tiburcio Pea   On: 11/12/2012 02:27   Ct Head Wo Contrast  11/12/2012    *RADIOLOGY REPORT*  Clinical Data: Altered mental status, history of bipolar disorder, coursing cough psychosis.  CT HEAD WITHOUT CONTRAST  Technique:  Contiguous axial images were obtained from the base of the skull through the vertex without contrast.  Comparison: CT of the head  09/16/2012  Findings:  The ventricles and sulci are normal for patient's age. No intraparenchymal hemorrhage, mass effect, midline shift or focal abnormal hypodensities.  No acute large vascular territory infarcts.  No abnormal extra-axial fluid collections, nor extraaxial masses. Basal cisterns are patent.  Small left maxillary mucosal retention cyst partially imaged, no paranasal sinus air-fluid levels.  Mastoid air cells appear well aerated.  Moderate temporomandibular osteoarthrosis.  No skull fracture.  Ocular globes and orbital contents are not suspicious though not tailored for evaluation.  IMPRESSION: No acute intracranial process; stable appearance of the head from September 16, 2012.   Original Report Authenticated By: Awilda Metro   US Abdomen Limited  10/15/2012   CLINICAL DATA:  Evaluate for ascites.  EXAM: LIMITED ABDOMEN ULTRASOUND FOR ASCITES  TECHNIQUE: Limited ultrasound survey for ascites was performed in all four abdominal quadrants.  COMPARISON:  10/12/2012  FINDINGS: 4 quadrant limited abdominal ultrasound was performed to assess for ascites. This again shows a small amount of pelvic ascites, not adequate to tap at this time.  IMPRESSION: Small amount of pelvic ascites, not adequate to tap.   Electronically Signed   By: Charlett Nose   On: 10/15/2012 13:27         Subjective: Patient is more lucid today, is pleasantly confused. Denies any fevers, chills, chest discomfort, shortness of breath, nausea, vomiting, diarrhea.  Objective: Filed Vitals:   11/12/12 0430  11/12/12 0515 11/12/12 1448 11/12/12 1501  BP: 104/56 123/64 122/56   Pulse: 93 91 73   Temp:  98.9 F (37.2 C) 98.1 F (36.7 C)    TempSrc:  Oral Oral   Resp: 15 18 18    Height:    5\' 8"  (1.727 m)  Weight:    127.8 kg (281 lb 12 oz)  SpO2: 99% 98% 100%     Intake/Output Summary (Last 24 hours) at 11/12/12 1644 Last data filed at 11/12/12 1449  Gross per 24 hour  Intake    480 ml  Output   1675 ml  Net  -1195 ml   Weight change:  Exam:   General:  Pt is alert, follows commands appropriately, not in acute distress  HEENT: No icterus, No thrush, [place]/AT  Cardiovascular: RRR, S1/S2, no rubs, no gallops  Respiratory: CTA bilaterally, no wheezing, no crackles, no rhonchi  Abdomen: Soft/+BS, non tender, non distended, no guarding  Extremities: 2+ edema, No lymphangitis, No petechiae, No rashes, no synovitis  Data Reviewed: Basic Metabolic Panel:  Recent Labs Lab 11/12/12 0235  NA 127*  K 3.3*  CL 89*  CO2 31  GLUCOSE 157*  BUN 12  CREATININE 0.75  CALCIUM 8.2*   Liver Function Tests:  Recent Labs Lab 11/12/12 0235  AST 55*  ALT 26  ALKPHOS 174*  BILITOT 2.9*  PROT 6.4  ALBUMIN 2.3*   No results found for this basename: LIPASE, AMYLASE,  in the last 168 hours  Recent Labs Lab 11/12/12 0235  AMMONIA 104*   CBC:  Recent Labs Lab 11/12/12 0235  WBC 3.5*  HGB 10.3*  HCT 30.3*  MCV 102.4*  PLT 46*   Cardiac Enzymes: No results found for this basename: CKTOTAL, CKMB, CKMBINDEX, TROPONINI,  in the last 168 hours BNP: No components found with this basename: POCBNP,  CBG:  Recent Labs Lab 11/12/12 0003 11/12/12 0724 11/12/12 1127  GLUCAP 159* 150* 157*    No results found for this or any previous visit (from the past 240 hour(s)).   Scheduled Meds: . ferrous sulfate  325 mg Oral QHS  . folic acid  1 mg Oral Daily  . furosemide  40 mg Oral BID  . [START ON 11/13/2012] influenza vac split quadrivalent PF  0.5 mL Intramuscular Tomorrow-1000  . insulin aspart  0-15 Units Subcutaneous TID WC  . insulin aspart  0-5 Units Subcutaneous QHS  . insulin glargine  20 Units  Subcutaneous QHS  . lactulose  30 g Oral QID  . levETIRAcetam  500 mg Oral BID  . levothyroxine  75 mcg Oral Daily  . pantoprazole  40 mg Oral Daily  . potassium chloride  10 mEq Oral Daily  . rifaximin  550 mg Oral BID  . risperiDONE  0.25 mg Oral BID  . sodium chloride  3 mL Intravenous Q12H  . spironolactone  100 mg Oral Daily   Continuous Infusions:    Elzabeth Mcquerry, DO  [facility] Pager [phone]  If 7PM-7AM, please contact night-coverage [email or web address] Password [email or web address] 11/12/2012, 4:44 PM   LOS: 1 day

## 2012-11-12 NOTE — Care Management Note (Signed)
    Page 1 of 1   11/12/2012     1:20:56 PM   CARE MANAGEMENT NOTE 11/12/2012  Patient:  Tony Boyd, Tony Boyd   Account Number:  [id number]  Date Initiated:  11/12/2012  Documentation initiated by:  Lanier Clam  Subjective/Objective Assessment:   57 Y/O M ADMITTED W/CONFUSION.HEPATICENCEPHALOPATHY.READMIT 8/17-8/22/14.     Action/Plan:   PER NSG ADMISSION FROM [facility], NOT ALF.   Anticipated DC Date:  11/17/2012   Anticipated DC Plan:  HOME/SELF CARE      DC Planning Services  CM consult      Choice offered to / List presented to:             Status of service:  In process, will continue to follow Medicare Important Message given?   (If response is "NO", the following Medicare IM given date fields will be blank) Date Medicare IM given:   Date Additional Medicare IM given:    Discharge Disposition:    Per UR Regulation:  Reviewed for med. necessity/level of care/duration of stay  If discussed at Long Length of Stay Meetings, dates discussed:    Comments:

## 2012-11-12 NOTE — H&P (Signed)
[facility] History and Physical  Nature Vogelsang XBJ:[id number] DOB: August 05, 1955    PCP:   [name], DO   Chief Complaint: confusion at the SNF.  HPI: Tony Boyd is an 57 y.o. male  with history of cirrhosis of liver, hepatitis C, hypothyroidism, seizures was brought to the ER after patient was found to be confused at the nursing facility. In the ER,  NH3 was found to be around 100. In addition patient is also found to be hyponatremic. His serum Na was  127, and felt to be chronic. Also found to have K of 3.3.  He has a normal WBC and HB.  His UA is negative, CXR with no acute process, and CT head was negative as well.  Hospitalist was asked to admit him for hepatic encephalopathy.     Rewiew of Systems:  He is confused, so ROS was not reliable, but he is comfortable.   Past Medical History  Diagnosis Date  . Cirrhosis of liver   . Hepatitis C   . ETOH abuse   . Hypothyroid   . Psychosis   . Bipolar disorder   . Seizures   . Arthritis   . Back pain   . Coagulopathy     Hx of  . Thrombocytopenia     Hx of  . Pancytopenia     Hx of  . H/O hypokalemia   . Hyponatremia     Hx of  . Korsakoff psychosis   . H/O abdominal abscess   . H/O esophageal varices   . Metabolic encephalopathy   . Hepatic encephalopathy   . Urinary urgency   . H/O renal failure   . H/O ascites   . Altered mental status   . Anemia   . Ascites   . Shortness of breath   . Peripheral vascular disease   . GERD (gastroesophageal reflux disease)   . Thrombocytopenia, acquired 06/02/2012  . Unspecified hypothyroidism 06/02/2012  . Pain in joint, pelvic region and thigh   . Edema   . Obesity, unspecified   . Chest pain, unspecified   . Pneumonitis due to inhalation of food or vomitus   . Other convulsions   . Cellulitis and abscess of upper arm and forearm   . Chronic hepatitis C without mention of hepatic coma   . Hypopotassemia   . Anemia, unspecified   . Other and unspecified  coagulation defects   . Thrombocytopenia, unspecified   . Bipolar I disorder, most recent episode (or current) unspecified   . Unspecified psychosis   . Encephalopathy   . Esophageal varices without mention of bleeding   . Esophagitis, unspecified   . Abscess of liver(572.0)   . Chronic kidney disease, unspecified   . Lumbago   . Personal history of alcoholism   . Personal history of tobacco use, presenting hazards to health   . Cirrhosis of liver without mention of alcohol   . Pneumonitis due to inhalation of food or vomitus 09/19/2010  . Chronic hepatitis C without mention of hepatic coma   . Bipolar I disorder, most recent episode (or current) unspecified   . Unspecified psychosis   . Other ascites   . Personal history of tobacco use, presenting hazards to health   . Cirrhosis of liver without mention of alcohol   . Type 2 diabetes mellitus with diabetic neuropathy     Past Surgical History  Procedure Laterality Date  . Colostomy    . Cholecystectomy    . Small  intestine surgery    . Arm surgery      left arm  . US guided drain placement in ventral hernia abscess  07/21/2010  . Multiple picc line placements    . Esophagogastroduodenoscopy  06/28/2011    Procedure: ESOPHAGOGASTRODUODENOSCOPY (EGD);  Surgeon: Louis Meckel, MD;  Location: [facility];  Service: Endoscopy;  Laterality: N/A;  . Esophagogastroduodenoscopy N/A 05/06/2012    Procedure: ESOPHAGOGASTRODUODENOSCOPY (EGD);  Surgeon: Louis Meckel, MD;  Location: [facility];  Service: Endoscopy;  Laterality: N/A;  . Esophageal banding N/A 05/06/2012    Procedure: ESOPHAGEAL BANDING;  Surgeon: Louis Meckel, MD;  Location: [facility];  Service: Endoscopy;  Laterality: N/A;    Medications:  HOME MEDS: Prior to Admission medications   Medication Sig Start Date End Date Taking? Authorizing Provider  ALPRAZolam Prudy Feeler) 1 MG tablet Take 1 tablet (1 mg total) by mouth at bedtime as needed for sleep. 10/31/12  Yes  Tiffany L Reed, DO  ferrous sulfate 325 (65 FE) MG tablet Take 325 mg by mouth at bedtime.   Yes Historical Provider, MD  folic acid (FOLVITE) 1 MG tablet Take 1 mg by mouth daily.   Yes Historical Provider, MD  furosemide (LASIX) 40 MG tablet Take 40 mg by mouth 2 (two) times daily.   Yes Historical Provider, MD  insulin aspart (NOVOLOG) 100 UNIT/ML injection Inject 6 Units into the skin 3 (three) times daily with meals.   Yes Historical Provider, MD  insulin glargine (LANTUS) 100 UNIT/ML injection Inject 20 Units into the skin at bedtime.   Yes Historical Provider, MD  levETIRAcetam (KEPPRA) 500 MG tablet Take 500 mg by mouth 2 (two) times daily.   Yes Historical Provider, MD  levothyroxine (SYNTHROID, LEVOTHROID) 75 MCG tablet Take 75 mcg by mouth daily.   Yes Historical Provider, MD  omeprazole (PRILOSEC) 20 MG capsule Take 20 mg by mouth daily.   Yes Historical Provider, MD  Oxycodone HCl 10 MG TABS Take 1 tablet (10 mg total) by mouth every 4 (four) hours. 10/31/12  Yes Tiffany L Reed, DO  potassium chloride (K-DUR,KLOR-CON) 10 MEQ tablet Take 10 mEq by mouth daily.   Yes Historical Provider, MD  rifaximin (XIFAXAN) 550 MG TABS Take 550 mg by mouth 2 (two) times daily. 01/26/12  Yes Alison Murray, MD  risperiDONE (RISPERDAL) 0.25 MG tablet Take 0.25 mg by mouth 2 (two) times daily.   Yes Historical Provider, MD  spironolactone (ALDACTONE) 100 MG tablet Take 100 mg by mouth daily.   Yes Historical Provider, MD     Allergies:  Allergies  Allergen Reactions  . Ativan [Lorazepam] Other (See Comments)    "hallucinations"  . Droperidol Other (See Comments)    unknown  . Ketorolac Other (See Comments)    unknown  . Penicillins Swelling and Other (See Comments)    unkown  . Toradol [Ketorolac Tromethamine] Hives    Social History:   reports that he quit smoking about 4 years ago. His smoking use included Cigarettes. He smoked 0.00 packs per day. He has never used smokeless tobacco. He  reports that he does not drink alcohol or use illicit drugs.  Family History: Family History  Problem Relation Age of Onset  . Heart disease Mother     MI  . Lung cancer Brother      Physical Exam: Filed Vitals:   11/11/12 2325 11/12/12 0307  BP: 115/66 120/63  Temp: 98.3 F (36.8 C) 98.9 F (37.2 C)  TempSrc: Oral  Oral  Resp: 17 10  SpO2: 98% 99%   Blood pressure 120/63, temperature 98.9 F (37.2 C), temperature source Oral, resp. rate 10, SpO2 99.00%.  GEN:  Pleasant  patient lying in the stretcher in no acute distress; cooperative with exam. PSYCH:  alert , but confused; does not appear anxious or depressed; affect is appropriate. HEENT: Mucous membranes pink and anicteric; PERRLA; EOM intact; no cervical lymphadenopathy nor thyromegaly or carotid bruit; no JVD; There were no stridor. Neck is very supple. Breasts:: Not examined CHEST WALL: No tenderness CHEST: Normal respiration, clear to auscultation bilaterally.  HEART: Regular rate and rhythm.  There are no murmur, rub, or gallops.   BACK: No kyphosis or scoliosis; no CVA tenderness ABDOMEN: soft and non-tender; no masses, no organomegaly, normal abdominal bowel sounds; no pannus; no intertriginous candida. There is no rebound. But his abdomen is large. Rectal Exam: Not done EXTREMITIES: No bone or joint deformity; age-appropriate arthropathy of the hands and knees; no edema; no ulcerations.  There is no calf tenderness. Genitalia: not examined PULSES: 2+ and symmetric SKIN: Normal hydration no rash or ulceration CNS: Cranial nerves 2-12 grossly intact no focal lateralizing neurologic deficit.  Speech is fluent; uvula elevated with phonation, facial symmetry and tongue midline. No astexisis. Labs on Admission:  Basic Metabolic Panel:  Recent Labs Lab 11/12/12 0235  NA 127*  K 3.3*  CL 89*  CO2 31  GLUCOSE 157*  BUN 12  CREATININE 0.75  CALCIUM 8.2*   Liver Function Tests:  Recent Labs Lab 11/12/12 0235   AST 55*  ALT 26  ALKPHOS 174*  BILITOT 2.9*  PROT 6.4  ALBUMIN 2.3*   No results found for this basename: LIPASE, AMYLASE,  in the last 168 hours  Recent Labs Lab 11/12/12 0235  AMMONIA 104*   CBC:  Recent Labs Lab 11/12/12 0235  WBC 3.5*  HGB 10.3*  HCT 30.3*  MCV 102.4*  PLT 46*   Cardiac Enzymes: No results found for this basename: CKTOTAL, CKMB, CKMBINDEX, TROPONINI,  in the last 168 hours  CBG:  Recent Labs Lab 11/12/12 0003  GLUCAP 159*     Radiological Exams on Admission: Dg Chest 2 View  11/12/2012   CLINICAL DATA:  Altered mental status  EXAM: CHEST  2 VIEW  COMPARISON:  10/12/2012  FINDINGS: Chronic cardiopericardial enlargement. Upper mediastinal contours are unchanged. Unchanged diffuse interstitial prominence, with cephalized pulmonary blood flow. No focal opacity, definite effusion, or pneumothorax.  IMPRESSION: Cardiomegaly and pulmonary venous hypertension.   Electronically Signed   By: Tiburcio Pea   On: 11/12/2012 02:27   Ct Head Wo Contrast  11/12/2012   *RADIOLOGY REPORT*  Clinical Data: Altered mental status, history of bipolar disorder, coursing cough psychosis.  CT HEAD WITHOUT CONTRAST  Technique:  Contiguous axial images were obtained from the base of the skull through the vertex without contrast.  Comparison: CT of the head  09/16/2012  Findings:  The ventricles and sulci are normal for patient's age. No intraparenchymal hemorrhage, mass effect, midline shift or focal abnormal hypodensities.  No acute large vascular territory infarcts.  No abnormal extra-axial fluid collections, nor extraaxial masses. Basal cisterns are patent.  Small left maxillary mucosal retention cyst partially imaged, no paranasal sinus air-fluid levels.  Mastoid air cells appear well aerated.  Moderate temporomandibular osteoarthrosis.  No skull fracture.  Ocular globes and orbital contents are not suspicious though not tailored for evaluation.  IMPRESSION: No acute  intracranial process; stable appearance of the head from  September 16, 2012.   Original Report Authenticated By: Awilda Metro   Assessment/Plan Present on Admission:  . Hepatic encephalopathy . Anemia of chronic disease . Anasarca . Cirrhosis . Diabetes mellitus . Esophageal varices without mention of bleeding . Hepatitis C . Hyponatremia . Hypokalemia . Seizure disorder  PLAN:  Will admit him to the hospital and begin lactulose.  He does have low Na, but this is likely chronic.  For his DM, will use SSI.  He also has low K and will be supplemented.  He has a limited code status, but no CPR, no defibrillator, and no intubation.  Thank you for allowing me to participate in his care.  Other plans as per orders.  Code Status: Limited Code:  No CPR, no defibrillator, and No intubation.   Houston Siren, MD. [facility] Pager [phone] 7pm to 7am.  11/12/2012, 4:31 AM

## 2012-11-12 NOTE — Progress Notes (Signed)
Patient from a nursing home-not able to activate MyChart-no computer or computer access

## 2012-11-12 NOTE — ED Notes (Signed)
2 attempts made for IV access unsuccessful. IV RN to be paged.

## 2012-11-12 NOTE — ED Notes (Signed)
IV RN paged and call returned for IV start. IV RN to come and assess pt for IV access.

## 2012-11-12 NOTE — Progress Notes (Signed)
Patient refused ammonia lab will draw in the am with other labs.

## 2012-11-13 DIAGNOSIS — D638 Anemia in other chronic diseases classified elsewhere: Secondary | ICD-10-CM

## 2012-11-13 DIAGNOSIS — D689 Coagulation defect, unspecified: Secondary | ICD-10-CM

## 2012-11-13 LAB — COMPREHENSIVE METABOLIC PANEL
ALT: 25 U/L (ref 0–53)
AST: 53 U/L — ABNORMAL HIGH (ref 0–37)
Albumin: 2.1 g/dL — ABNORMAL LOW (ref 3.5–5.2)
Alkaline Phosphatase: 161 U/L — ABNORMAL HIGH (ref 39–117)
GFR calc Af Amer: >90 mL/min (ref >90)
Glucose, Bld: 181 mg/dL — ABNORMAL HIGH (ref 70–99)
Potassium: 3.4 mEq/L — ABNORMAL LOW (ref 3.5–5.1)
Sodium: 127 mEq/L — ABNORMAL LOW (ref 135–145)
Total Protein: 6.1 g/dL (ref 6.0–8.3)

## 2012-11-13 LAB — GLUCOSE, CAPILLARY
Glucose-Capillary: 158 mg/dL — ABNORMAL HIGH (ref 70–99)
Glucose-Capillary: 214 mg/dL — ABNORMAL HIGH (ref 70–99)
Glucose-Capillary: 158 mg/dL — ABNORMAL HIGH (ref 70–99)

## 2012-11-13 LAB — CBC
HCT: 28.7 % — ABNORMAL LOW (ref 39.0–52.0)
MCH: 35.3 pg — ABNORMAL HIGH (ref 26.0–34.0)
MCHC: 34.8 g/dL (ref 30.0–36.0)
MCV: 101.4 fL — ABNORMAL HIGH (ref 78.0–100.0)
RDW: 15.6 % — ABNORMAL HIGH (ref 11.5–15.5)

## 2012-11-13 MED ORDER — LACTULOSE 10 GM/15ML PO SOLN
30.0000 g | Freq: Every day | ORAL | Status: DC
  Administered 2012-11-13 – 2012-11-14 (×7): 30 g via ORAL
  Filled 2012-11-13 (×10): qty 45

## 2012-11-13 NOTE — Discharge Summary (Signed)
Physician Discharge Summary  Tony Boyd ZOX:[id number] DOB: 12-Feb-1956 DOA: 11/11/2012  PCP: [name], DO  Admit date: 11/11/2012 Discharge date: 11/14/2012  Recommendations for Outpatient Follow-up:  1. Pt will need to follow up with PCP in 2 weeks post discharge 2. Please obtain BMP to evaluate electrolytes and kidney function 3. Please also check CBC to evaluate Hg and Hct levels   Discharge Diagnoses:  Principal Problem:   Hepatic encephalopathy Active Problems:   Hepatitis C   Hyponatremia   Cirrhosis   Esophageal varices without mention of bleeding   Seizure disorder   Diabetes mellitus   Anasarca   Anemia of chronic disease   Hypokalemia Hepatic encephalopathy  -Due to the patient noncompliance with lactulose  -Patient had been missing 2 days prior to admission  -Gradually improving on lactulose in-house  -recheck ammonia-->114 -Ammonia improved to 53 on the day of discharge when the patient's lactulose was increased to his normal dosing 5 times daily. -The patient's mentation returned to baseline on the day of discharge.  -Continue rifaximin  -Change lactulose to 30 g, 5X daily  -The patient's oxycodone was decreased to 5 mg every 4 hours when necessary pain, and the patient's back pain remained controlled. -He will be discharged with oxycodone 5 mg every 4 hours prn pain, #50, no refills Diabetes mellitus type 2, uncontrolled  -Hemoglobin A1c 5.3 on 10/06/12  -continue lantus 20 units  -Continue NovoLog sliding scale while in hospital  -Long discussion with the patient today. He feels confident that he is able to manage his own insulin at home  Hyponatremia  -Secondary to patient's liver cirrhosis  -Continue diuresis with furosemide and spironolactone  -TSH--3.470  -Baseline sodium 124-129  - Continue home doses of furosemide and spironolactone Deconditioning  - PT evaluation  - Patient had difficulty getting himself to sit up in bed  -PT recommend home  health PT which the case manager helped set up Hypothyroidism  - Continue Synthroid current dose  - TSH--3.470  Thrombocytopenia/Coaguloapthy  -INR 1.8 on 10/15/12  - Patient's platelets remained stable throughout the hospitalization. -Secondary to patient's cirrhosis  -No active bleeding, clinically stable at this time  End-stage liver disease  -Patient is a not a candidate for transplantation at this time due to continued alcohol usage  Seizure disorder  -Continue Keppra  -No active seizures at this time  History of grade 3 esophageal varices  - No signs of active bleeding at this time  - Continue Protonix  Family Communication: daughter, [name]--updated  Disposition Plan: SNF when medically stable   Discharge Condition: stable  Disposition:   Diet:low sodium Wt Readings from Last 3 Encounters:  11/12/12 127.8 kg (281 lb 12 oz)  10/14/12 140.7 kg (310 lb 3 oz)  10/08/12 147.5 kg (325 lb 2.9 oz)    History of present illness:  57 year old male with a history of hepatitis C, continued alcohol use, seizure disorder, hypothyroidism, aspiration pneumonitis, esophageal varices, presented with acute mental status change. At the time of admission in the emergency department, the patient was confused and unable to provide any history. Apparently the patient's daughter was going to check up on the patient and the patient was found to be confused. Initial ammonia was noted to be 104. Chest x-ray was negative for acute process. CT of the brain was negative. The patient had normal WBC and hemoglobin. Urinalysis did not show any pyuria. The patient was found to be hypokalemic. This was repleted. The patient was restarted on lactulose. As  the patient became lucid, he stated that he had not been taking his lactulose properly for 2-3 days prior to admission. The patient's ammonia went up to 114 on 11/13/2012. However, his mentation did gradually improve. The patient stated that he normally took  lactulose 30 g, 5 times per day.  His dosing was adjusted in the hospital to match his home dosing. The patient was continued on his rifaximin.on the day of discharge, his ammonia returned to his baseline of 53. The patient's hepatic enzymes and bilirubin remained stable throughout the hospitalization. The patient was found to be hyponatremic. Review of the records revealed that the patient has a history of chronic hyponatremia likely due to his liver cirrhosis. His hyponatremia remained between his baseline of 125-129.    Discharge Exam: Filed Vitals:   11/14/12 0427  BP: 111/58  Pulse: 81  Temp: 98.4 F (36.9 C)  Resp: 18   Filed Vitals:   11/13/12 0500 11/13/12 1427 11/13/12 2125 11/14/12 0427  BP: 139/62 122/54 127/55 111/58  Pulse: 80  80 81  Temp: 98.4 F (36.9 C) 98 F (36.7 C) 97.8 F (36.6 C) 98.4 F (36.9 C)  TempSrc: Oral Oral Oral Oral  Resp: 18 18 20 18   Height:      Weight:      SpO2: 98% 100% 100% 100%   General: A&O x 3, NAD, pleasant, cooperative Cardiovascular: RRR, no rub, no gallop, no S3 Respiratory: CTAB, no wheeze, no rhonchi Abdomen:soft, nontender, nondistended, positive bowel sounds Extremities: 2+ edema, No lymphangitis, no petechiae  Discharge Instructions      Discharge Orders   Future Orders Complete By Expires   Diet - low sodium heart healthy  As directed    Increase activity slowly  As directed        Medication List         ALPRAZolam 1 MG tablet  Commonly known as:  XANAX  Take 1 tablet (1 mg total) by mouth at bedtime as needed for sleep.     ferrous sulfate 325 (65 FE) MG tablet  Take 325 mg by mouth at bedtime.     folic acid 1 MG tablet  Commonly known as:  FOLVITE  Take 1 mg by mouth daily.     furosemide 40 MG tablet  Commonly known as:  LASIX  Take 40 mg by mouth 2 (two) times daily.     insulin aspart 100 UNIT/ML injection  Commonly known as:  novoLOG  Inject 6 Units into the skin 3 (three) times daily with  meals.     insulin glargine 100 UNIT/ML injection  Commonly known as:  LANTUS  Inject 20 Units into the skin at bedtime.     lactulose 10 GM/15ML solution  Commonly known as:  CHRONULAC  Take 45 mLs (30 g total) by mouth 5 (five) times daily.     levETIRAcetam 500 MG tablet  Commonly known as:  KEPPRA  Take 500 mg by mouth 2 (two) times daily.     levothyroxine 75 MCG tablet  Commonly known as:  SYNTHROID, LEVOTHROID  Take 75 mcg by mouth daily.     omeprazole 20 MG capsule  Commonly known as:  PRILOSEC  Take 20 mg by mouth daily.     oxyCODONE 5 MG immediate release tablet  Commonly known as:  Oxy IR/ROXICODONE  Take 1 tablet (5 mg total) by mouth every 4 (four) hours as needed.     potassium chloride 10 MEQ tablet  Commonly known  as:  K-DUR,KLOR-CON  Take 10 mEq by mouth daily.     rifaximin 550 MG Tabs tablet  Commonly known as:  XIFAXAN  Take 550 mg by mouth 2 (two) times daily.     risperiDONE 0.25 MG tablet  Commonly known as:  RISPERDAL  Take 0.25 mg by mouth 2 (two) times daily.     spironolactone 100 MG tablet  Commonly known as:  ALDACTONE  Take 100 mg by mouth daily.         The results of significant diagnostics from this hospitalization (including imaging, microbiology, ancillary and laboratory) are listed below for reference.    Significant Diagnostic Studies: Dg Chest 2 View  11/12/2012   CLINICAL DATA:  Altered mental status  EXAM: CHEST  2 VIEW  COMPARISON:  10/12/2012  FINDINGS: Chronic cardiopericardial enlargement. Upper mediastinal contours are unchanged. Unchanged diffuse interstitial prominence, with cephalized pulmonary blood flow. No focal opacity, definite effusion, or pneumothorax.  IMPRESSION: Cardiomegaly and pulmonary venous hypertension.   Electronically Signed   By: Tiburcio Pea   On: 11/12/2012 02:27   Ct Head Wo Contrast  11/12/2012   *RADIOLOGY REPORT*  Clinical Data: Altered mental status, history of bipolar disorder,  coursing cough psychosis.  CT HEAD WITHOUT CONTRAST  Technique:  Contiguous axial images were obtained from the base of the skull through the vertex without contrast.  Comparison: CT of the head  09/16/2012  Findings:  The ventricles and sulci are normal for patient's age. No intraparenchymal hemorrhage, mass effect, midline shift or focal abnormal hypodensities.  No acute large vascular territory infarcts.  No abnormal extra-axial fluid collections, nor extraaxial masses. Basal cisterns are patent.  Small left maxillary mucosal retention cyst partially imaged, no paranasal sinus air-fluid levels.  Mastoid air cells appear well aerated.  Moderate temporomandibular osteoarthrosis.  No skull fracture.  Ocular globes and orbital contents are not suspicious though not tailored for evaluation.  IMPRESSION: No acute intracranial process; stable appearance of the head from September 16, 2012.   Original Report Authenticated By: Awilda Metro   US Abdomen Limited  10/15/2012   CLINICAL DATA:  Evaluate for ascites.  EXAM: LIMITED ABDOMEN ULTRASOUND FOR ASCITES  TECHNIQUE: Limited ultrasound survey for ascites was performed in all four abdominal quadrants.  COMPARISON:  10/12/2012  FINDINGS: 4 quadrant limited abdominal ultrasound was performed to assess for ascites. This again shows a small amount of pelvic ascites, not adequate to tap at this time.  IMPRESSION: Small amount of pelvic ascites, not adequate to tap.   Electronically Signed   By: Charlett Nose   On: 10/15/2012 13:27     Microbiology: No results found for this or any previous visit (from the past 240 hour(s)).   Labs: Basic Metabolic Panel:  Recent Labs Lab 11/12/12 0235 11/13/12 0258 11/14/12 0405  NA 127* 127* 127*  K 3.3* 3.4* 3.3*  CL 89* 90* 91*  CO2 31 30 30   GLUCOSE 157* 181* 179*  BUN 12 11 11   CREATININE 0.75 0.69 0.70  CALCIUM 8.2* 8.2* 8.2*  MG  --  1.3*  --    Liver Function Tests:  Recent Labs Lab 11/12/12 0235  11/13/12 0258  AST 55* 53*  ALT 26 25  ALKPHOS 174* 161*  BILITOT 2.9* 2.9*  PROT 6.4 6.1  ALBUMIN 2.3* 2.1*   No results found for this basename: LIPASE, AMYLASE,  in the last 168 hours  Recent Labs Lab 11/12/12 0235 11/13/12 0258 11/14/12 0405  AMMONIA 104* 114* 53  CBC:  Recent Labs Lab 11/12/12 0235 11/13/12 0258  WBC 3.5* 2.9*  HGB 10.3* 10.0*  HCT 30.3* 28.7*  MCV 102.4* 101.4*  PLT 46* 43*   Cardiac Enzymes: No results found for this basename: CKTOTAL, CKMB, CKMBINDEX, TROPONINI,  in the last 168 hours BNP: No components found with this basename: POCBNP,  CBG:  Recent Labs Lab 11/13/12 1130 11/13/12 1658 11/13/12 2118 11/14/12 0734 11/14/12 1159  GLUCAP 177* 214* 158* 174* 254*    Time coordinating discharge:  Greater than 30 minutes  Signed:  Meshell Abdulaziz, DO [facility] Pager: [phone] 11/14/2012, 2:44 PM

## 2012-11-13 NOTE — Progress Notes (Signed)
[facility] PROGRESS NOTE  Tony Boyd UJW:[id number] DOB: Mar 16, 1955 DOA: 11/11/2012 PCP: [name], DO  Assessment/Plan: Hepatic encephalopathy  -Due to the patient noncompliance with lactulose  -Patient had been missing 2 days prior to admission  -Gradually improving on lactulose in-house  -recheck ammonia-->114 -Continue rifaximin  -Change lactulose to 30 g, 5X daily  Diabetes mellitus type 2, uncontrolled  -Hemoglobin A1c 5.3 on 10/06/12  -continue lantus 20 units  -Continue NovoLog sliding scale while in hospital  -Long discussion with the patient today. He feels confident that he is able to manage his own insulin at home  Hyponatremia  -Secondary to patient's liver cirrhosis  -Continue diuresis with furosemide and spironolactone  -TSH--3.470  -Baseline sodium 124-129  Deconditioning - PT evaluation - Patient had difficulty getting himself to sit up in bed Hypothyroidism  - Continue Synthroid current dose  - TSH--3.470  Thrombocytopenia/Coaguloapthy  -INR 1.8 on 10/15/12  -Secondary to patient's cirrhosis  -No active bleeding, clinically stable at this time  End-stage liver disease  -Patient is a not a candidate for transplantation at this time due to continued alcohol usage  Seizure disorder  -Continue Keppra  -No active seizures at this time  History of grade 3 esophageal varices  - No signs of active bleeding at this time  - Continue Protonix  Family Communication: daughter, [name]--updated Disposition Plan: SNF when medically stable         Procedures/Studies: Dg Chest 2 View  11/12/2012   CLINICAL DATA:  Altered mental status  EXAM: CHEST  2 VIEW  COMPARISON:  10/12/2012  FINDINGS: Chronic cardiopericardial enlargement. Upper mediastinal contours are unchanged. Unchanged diffuse interstitial prominence, with cephalized pulmonary blood flow. No focal opacity, definite effusion, or pneumothorax.  IMPRESSION: Cardiomegaly and pulmonary venous  hypertension.   Electronically Signed   By: Tiburcio Pea   On: 11/12/2012 02:27   Ct Head Wo Contrast  11/12/2012   *RADIOLOGY REPORT*  Clinical Data: Altered mental status, history of bipolar disorder, coursing cough psychosis.  CT HEAD WITHOUT CONTRAST  Technique:  Contiguous axial images were obtained from the base of the skull through the vertex without contrast.  Comparison: CT of the head  09/16/2012  Findings:  The ventricles and sulci are normal for patient's age. No intraparenchymal hemorrhage, mass effect, midline shift or focal abnormal hypodensities.  No acute large vascular territory infarcts.  No abnormal extra-axial fluid collections, nor extraaxial masses. Basal cisterns are patent.  Small left maxillary mucosal retention cyst partially imaged, no paranasal sinus air-fluid levels.  Mastoid air cells appear well aerated.  Moderate temporomandibular osteoarthrosis.  No skull fracture.  Ocular globes and orbital contents are not suspicious though not tailored for evaluation.  IMPRESSION: No acute intracranial process; stable appearance of the head from September 16, 2012.   Original Report Authenticated By: Awilda Metro   US Abdomen Limited  10/15/2012   CLINICAL DATA:  Evaluate for ascites.  EXAM: LIMITED ABDOMEN ULTRASOUND FOR ASCITES  TECHNIQUE: Limited ultrasound survey for ascites was performed in all four abdominal quadrants.  COMPARISON:  10/12/2012  FINDINGS: 4 quadrant limited abdominal ultrasound was performed to assess for ascites. This again shows a small amount of pelvic ascites, not adequate to tap at this time.  IMPRESSION: Small amount of pelvic ascites, not adequate to tap.   Electronically Signed   By: Charlett Nose   On: 10/15/2012 13:27         Subjective: Patient is more lucid today. Denies any fevers, chills, headache, chest  pain, shortness of breath, nausea, vomiting, abdominal pain. He is having loose stools. No hematochezia or melena.  Objective: Filed Vitals:    11/12/12 1448 11/12/12 1501 11/12/12 2121 11/13/12 0500  BP: 122/56  113/61 139/62  Pulse: 73  82 80  Temp: 98.1 F (36.7 C)  98.3 F (36.8 C) 98.4 F (36.9 C)  TempSrc: Oral  Oral Oral  Resp: 18  18 18   Height:  5\' 8"  (1.727 m)    Weight:  127.8 kg (281 lb 12 oz)    SpO2: 100%  99% 98%    Intake/Output Summary (Last 24 hours) at 11/13/12 1327 Last data filed at 11/13/12 0838  Gross per 24 hour  Intake    720 ml  Output   2200 ml  Net  -1480 ml   Weight change:  Exam:   General:  Pt is alert, follows commands appropriately, not in acute distress  HEENT: No icterus, No thrush,  [place]/AT  Cardiovascular: RRR, S1/S2, no rubs, no gallops  Respiratory: CTA bilaterally, no wheezing, no crackles, no rhonchi  Abdomen: Soft/+BS, non tender, non distended, no guarding  Extremities: 2+ edema, No lymphangitis, No petechiae, No rashes, no synovitis  Data Reviewed: Basic Metabolic Panel:  Recent Labs Lab 11/12/12 0235 11/13/12 0258  NA 127* 127*  K 3.3* 3.4*  CL 89* 90*  CO2 31 30  GLUCOSE 157* 181*  BUN 12 11  CREATININE 0.75 0.69  CALCIUM 8.2* 8.2*  MG  --  1.3*   Liver Function Tests:  Recent Labs Lab 11/12/12 0235 11/13/12 0258  AST 55* 53*  ALT 26 25  ALKPHOS 174* 161*  BILITOT 2.9* 2.9*  PROT 6.4 6.1  ALBUMIN 2.3* 2.1*   No results found for this basename: LIPASE, AMYLASE,  in the last 168 hours  Recent Labs Lab 11/12/12 0235 11/13/12 0258  AMMONIA 104* 114*   CBC:  Recent Labs Lab 11/12/12 0235 11/13/12 0258  WBC 3.5* 2.9*  HGB 10.3* 10.0*  HCT 30.3* 28.7*  MCV 102.4* 101.4*  PLT 46* 43*   Cardiac Enzymes: No results found for this basename: CKTOTAL, CKMB, CKMBINDEX, TROPONINI,  in the last 168 hours BNP: No components found with this basename: POCBNP,  CBG:  Recent Labs Lab 11/12/12 1127 11/12/12 1729 11/12/12 2136 11/13/12 0721 11/13/12 1130  GLUCAP 157* 176* 176* 158* 177*    No results found for this or any previous  visit (from the past 240 hour(s)).   Scheduled Meds: . ferrous sulfate  325 mg Oral QHS  . folic acid  1 mg Oral Daily  . furosemide  40 mg Oral BID  . insulin aspart  0-15 Units Subcutaneous TID WC  . insulin aspart  0-5 Units Subcutaneous QHS  . insulin glargine  20 Units Subcutaneous QHS  . lactulose  30 g Oral 5 X Daily  . levETIRAcetam  500 mg Oral BID  . levothyroxine  75 mcg Oral Daily  . pantoprazole  40 mg Oral Daily  . potassium chloride  10 mEq Oral Daily  . rifaximin  550 mg Oral BID  . risperiDONE  0.25 mg Oral BID  . sodium chloride  3 mL Intravenous Q12H  . spironolactone  100 mg Oral Daily   Continuous Infusions:    Chandi Nicklin, DO  [facility] Pager [phone]  If 7PM-7AM, please contact night-coverage [email or web address] Password [email or web address] 11/13/2012, 1:27 PM   LOS: 2 days

## 2012-11-14 LAB — BASIC METABOLIC PANEL
CO2: 30 mEq/L (ref 19–32)
Calcium: 8.2 mg/dL — ABNORMAL LOW (ref 8.4–10.5)
Creatinine, Ser: 0.7 mg/dL (ref 0.50–1.35)
GFR calc Af Amer: >90 mL/min (ref >90)
GFR calc non Af Amer: >90 mL/min (ref >90)
Sodium: 127 mEq/L — ABNORMAL LOW (ref 135–145)

## 2012-11-14 LAB — AMMONIA: Ammonia: 53 umol/L (ref 11–60)

## 2012-11-14 MED ORDER — POTASSIUM CHLORIDE CRYS ER 20 MEQ PO TBCR
20.0000 meq | EXTENDED_RELEASE_TABLET | Freq: Once | ORAL | Status: AC
  Administered 2012-11-14: 16:00:00 20 meq via ORAL
  Filled 2012-11-14: qty 1

## 2012-11-14 MED ORDER — LACTULOSE 10 GM/15ML PO SOLN: 30.0000 g | Freq: Every day | ORAL | Status: DC

## 2012-11-14 MED ORDER — OXYCODONE HCL 5 MG PO TABS: 5.0000 mg | ORAL_TABLET | ORAL | Status: DC | PRN

## 2012-11-14 NOTE — Evaluation (Signed)
Physical Therapy Evaluation Patient Details Name: Tony Boyd MRN: [id number] DOB: [birth year] Today's Date: 11/14/2012 Time: 4098-1191 PT Time Calculation (min): 15 min  PT Assessment / Plan / Recommendation History of Present Illness  57 yo male admitted with hepatic encephalopathy. Hx of Hep C, Sz, cirrhosis. From [facility] living  Clinical Impression  On eval, pt required Min guard assist for mobility-able to ambulate ~200 feet with RW. Noted antalgic gait pattern R side-pt c/o R hip pain-denies fall. Pt participated well with session. No LOB. Recommend walker use for ambulation and HHPT.     PT Assessment  Patient needs continued PT services    Follow Up Recommendations  Home health PT; Intermittent Supervision    Does the patient have the potential to tolerate intense rehabilitation      Barriers to Discharge        Equipment Recommendations  None recommended by PT    Recommendations for Other Services OT consult   Frequency Min 3X/week    Precautions / Restrictions Precautions Precautions: Fall Restrictions Weight Bearing Restrictions: No   Pertinent Vitals/Pain R hip 5/10 with activity      Mobility  Bed Mobility Bed Mobility: Supine to Sit Supine to Sit: 6: Modified independent (Device/Increase time);HOB flat Transfers Transfers: Sit to Stand;Stand to Sit Sit to Stand: 5: Supervision;From bed Stand to Sit: 5: Supervision;To chair/3-in-1 Ambulation/Gait Ambulation/Gait Assistance: 4: Min guard Ambulation Distance (Feet): 200 Feet Assistive device: Rolling walker Ambulation/Gait Assistance Details: Antalgic gait pattern. Pt reports R hip pain-acute. good gait speed. No LOB Gait Pattern: Antalgic;Decreased stride length Stairs: No    Exercises     PT Diagnosis: Difficulty walking;Abnormality of gait;Generalized weakness;Acute pain  PT Problem List: Decreased mobility;Pain PT Treatment Interventions: DME instruction;Gait  training;Functional mobility training;Therapeutic activities;Therapeutic exercise;Patient/family education     PT Goals(Current goals can be found in the care plan section) Acute Rehab PT Goals Patient Stated Goal: home PT Goal Formulation: With patient Time For Goal Achievement: 11/28/12 Potential to Achieve Goals: Good  Visit Information  Last PT Received On: 11/14/12 Assistance Needed: +1 History of Present Illness: 57 yo male admitted with hepatic encephalopathy. Hx of Hep C, Sz, cirrhosis. From [facility] living       Prior Functioning  Home Living Family/patient expects to be discharged to:: Private residence Living Arrangements: Alone Available Help at Discharge: Family Type of Home: Assisted living Home Access: Level entry;Elevator Home Layout: One level Home Equipment: Environmental consultant - 4 wheels;Tub bench;Shower seat;Grab bars - tub/shower;Bedside commode Prior Function Level of Independence: Independent with assistive device(s) Communication Communication: No difficulties    Cognition  Cognition Arousal/Alertness: Awake/alert Behavior During Therapy: WFL for tasks assessed/performed Overall Cognitive Status: Within Functional Limits for tasks assessed    Extremity/Trunk Assessment Upper Extremity Assessment Upper Extremity Assessment: Generalized weakness Lower Extremity Assessment Lower Extremity Assessment: Generalized weakness Cervical / Trunk Assessment Cervical / Trunk Assessment: Normal   Balance    End of Session PT - End of Session Activity Tolerance: Patient tolerated treatment well Patient left: in chair;with call bell/phone within reach  GP     Rebeca Alert, MPT Pager: [phone]

## 2012-11-14 NOTE — Progress Notes (Signed)
Spoke with Mr Tony Boyd at bedside to offer [facility] Care Management services. He pleasantly declined [facility] Care Management. Left brochure at bedside for patient to call in the future in case he changes his mind.  [name], MSN- Ed, Charity fundraiser, BSN- [facility] [phone]

## 2012-11-14 NOTE — Progress Notes (Addendum)
Pt is active [facility] needs and will continue with [facility]. Spoke with Elnita Maxwell, RN with [facility] orders faxed [phone], office [phone].

## 2012-11-14 NOTE — Progress Notes (Deleted)
[facility]  Patient Status: New  [facility] is providing the following services: PT and MSW  If patient discharges after hours, please call [phone].   Lanae Crumbly 11/14/2012, 2:27 PM

## 2012-11-17 ENCOUNTER — Encounter: Payer: Self-pay | Admitting: *Deleted

## 2012-11-17 ENCOUNTER — Telehealth: Payer: Self-pay | Admitting: *Deleted

## 2012-11-17 LAB — GLUCOSE, CAPILLARY: Glucose-Capillary: 178 mg/dL — ABNORMAL HIGH (ref 70–99)

## 2012-11-17 NOTE — Telephone Encounter (Signed)
Patient daughter notified and stated she would go with another Dr. To get letter

## 2012-11-17 NOTE — Telephone Encounter (Signed)
Pt is not safe to live by himself.  I cannot complete a letter that says so.

## 2012-11-17 NOTE — Telephone Encounter (Signed)
Patient daughter, Tamera Punt, called and stated that she needs a letter stating that patient is able to live by himself. Patient is living in an independent residence at the [facility]

## 2012-11-24 ENCOUNTER — Other Ambulatory Visit: Payer: Self-pay | Admitting: Internal Medicine

## 2012-11-27 ENCOUNTER — Other Ambulatory Visit: Payer: Self-pay | Admitting: *Deleted

## 2012-11-28 ENCOUNTER — Other Ambulatory Visit: Payer: Self-pay | Admitting: *Deleted

## 2012-11-28 MED ORDER — OXYCODONE HCL 5 MG PO TABS: 5.0000 mg | ORAL_TABLET | ORAL | Status: DC | PRN

## 2012-11-28 MED ORDER — OXYCODONE HCL 10 MG PO TABS: 10.0000 mg | ORAL_TABLET | ORAL | Status: DC

## 2012-12-03 ENCOUNTER — Inpatient Hospital Stay (HOSPITAL_COMMUNITY)
Admission: EM | Admit: 2012-12-03 | Discharge: 2012-12-11 | DRG: 442 | Disposition: A | Discharge status: Home / Self Care | Payer: Medicare Other | Attending: Internal Medicine | Admitting: Internal Medicine

## 2012-12-03 ENCOUNTER — Emergency Department (HOSPITAL_COMMUNITY): Payer: Medicare Other

## 2012-12-03 ENCOUNTER — Encounter (HOSPITAL_COMMUNITY): Payer: Self-pay | Admitting: Emergency Medicine

## 2012-12-03 DIAGNOSIS — R0989 Other specified symptoms and signs involving the circulatory and respiratory systems: Secondary | ICD-10-CM | POA: Diagnosis present

## 2012-12-03 DIAGNOSIS — E039 Hypothyroidism, unspecified: Secondary | ICD-10-CM

## 2012-12-03 DIAGNOSIS — R601 Generalized edema: Secondary | ICD-10-CM

## 2012-12-03 DIAGNOSIS — R0602 Shortness of breath: Secondary | ICD-10-CM

## 2012-12-03 DIAGNOSIS — E236 Other disorders of pituitary gland: Secondary | ICD-10-CM | POA: Diagnosis present

## 2012-12-03 DIAGNOSIS — E1165 Type 2 diabetes mellitus with hyperglycemia: Secondary | ICD-10-CM | POA: Diagnosis present

## 2012-12-03 DIAGNOSIS — R0600 Dyspnea, unspecified: Secondary | ICD-10-CM | POA: Diagnosis present

## 2012-12-03 DIAGNOSIS — K7460 Unspecified cirrhosis of liver: Secondary | ICD-10-CM

## 2012-12-03 DIAGNOSIS — D638 Anemia in other chronic diseases classified elsewhere: Secondary | ICD-10-CM

## 2012-12-03 DIAGNOSIS — F4310 Post-traumatic stress disorder, unspecified: Secondary | ICD-10-CM

## 2012-12-03 DIAGNOSIS — G40909 Epilepsy, unspecified, not intractable, without status epilepticus: Secondary | ICD-10-CM

## 2012-12-03 DIAGNOSIS — D649 Anemia, unspecified: Secondary | ICD-10-CM | POA: Diagnosis present

## 2012-12-03 DIAGNOSIS — R609 Edema, unspecified: Secondary | ICD-10-CM | POA: Diagnosis present

## 2012-12-03 DIAGNOSIS — B1920 Unspecified viral hepatitis C without hepatic coma: Secondary | ICD-10-CM

## 2012-12-03 DIAGNOSIS — K7290 Hepatic failure, unspecified without coma: Secondary | ICD-10-CM | POA: Diagnosis present

## 2012-12-03 DIAGNOSIS — K766 Portal hypertension: Secondary | ICD-10-CM | POA: Diagnosis present

## 2012-12-03 DIAGNOSIS — K7030 Alcoholic cirrhosis of liver without ascites: Secondary | ICD-10-CM | POA: Diagnosis present

## 2012-12-03 DIAGNOSIS — K7682 Hepatic encephalopathy: Secondary | ICD-10-CM

## 2012-12-03 DIAGNOSIS — J9819 Other pulmonary collapse: Secondary | ICD-10-CM | POA: Diagnosis present

## 2012-12-03 DIAGNOSIS — E1149 Type 2 diabetes mellitus with other diabetic neurological complication: Secondary | ICD-10-CM | POA: Diagnosis present

## 2012-12-03 DIAGNOSIS — IMO0002 Reserved for concepts with insufficient information to code with codable children: Secondary | ICD-10-CM

## 2012-12-03 DIAGNOSIS — E669 Obesity, unspecified: Secondary | ICD-10-CM | POA: Diagnosis present

## 2012-12-03 DIAGNOSIS — Z6839 Body mass index (BMI) 39.0-39.9, adult: Secondary | ICD-10-CM

## 2012-12-03 DIAGNOSIS — T502X5A Adverse effect of carbonic-anhydrase inhibitors, benzothiadiazides and other diuretics, initial encounter: Secondary | ICD-10-CM | POA: Diagnosis not present

## 2012-12-03 DIAGNOSIS — D696 Thrombocytopenia, unspecified: Secondary | ICD-10-CM

## 2012-12-03 DIAGNOSIS — E7220 Disorder of urea cycle metabolism, unspecified: Secondary | ICD-10-CM

## 2012-12-03 DIAGNOSIS — E1142 Type 2 diabetes mellitus with diabetic polyneuropathy: Secondary | ICD-10-CM | POA: Diagnosis present

## 2012-12-03 DIAGNOSIS — D689 Coagulation defect, unspecified: Secondary | ICD-10-CM

## 2012-12-03 DIAGNOSIS — E871 Hypo-osmolality and hyponatremia: Secondary | ICD-10-CM

## 2012-12-03 DIAGNOSIS — K319 Disease of stomach and duodenum, unspecified: Secondary | ICD-10-CM | POA: Diagnosis present

## 2012-12-03 DIAGNOSIS — R279 Unspecified lack of coordination: Secondary | ICD-10-CM | POA: Diagnosis present

## 2012-12-03 DIAGNOSIS — B182 Chronic viral hepatitis C: Principal | ICD-10-CM | POA: Diagnosis present

## 2012-12-03 DIAGNOSIS — F319 Bipolar disorder, unspecified: Secondary | ICD-10-CM | POA: Diagnosis present

## 2012-12-03 DIAGNOSIS — I8510 Secondary esophageal varices without bleeding: Secondary | ICD-10-CM | POA: Diagnosis present

## 2012-12-03 DIAGNOSIS — E876 Hypokalemia: Secondary | ICD-10-CM

## 2012-12-03 DIAGNOSIS — R0609 Other forms of dyspnea: Secondary | ICD-10-CM | POA: Diagnosis present

## 2012-12-03 DIAGNOSIS — I8500 Esophageal varices without bleeding: Secondary | ICD-10-CM

## 2012-12-03 DIAGNOSIS — E875 Hyperkalemia: Secondary | ICD-10-CM

## 2012-12-03 DIAGNOSIS — Z8659 Personal history of other mental and behavioral disorders: Secondary | ICD-10-CM

## 2012-12-03 LAB — BASIC METABOLIC PANEL
BUN: 14 mg/dL (ref 6–23)
CO2: 21 mEq/L (ref 19–32)
Calcium: 8.1 mg/dL — ABNORMAL LOW (ref 8.4–10.5)
Chloride: 90 mEq/L — ABNORMAL LOW (ref 96–112)
Creatinine, Ser: 0.77 mg/dL (ref 0.50–1.35)
Glucose, Bld: 174 mg/dL — ABNORMAL HIGH (ref 70–99)

## 2012-12-03 LAB — HEPATIC FUNCTION PANEL
ALT: 28 U/L (ref 0–53)
Alkaline Phosphatase: 247 U/L — ABNORMAL HIGH (ref 39–117)
Indirect Bilirubin: 1.8 mg/dL — ABNORMAL HIGH (ref 0.3–0.9)
Total Protein: 7.4 g/dL (ref 6.0–8.3)

## 2012-12-03 LAB — CBC
HCT: 30.2 % — ABNORMAL LOW (ref 39.0–52.0)
MCHC: 35.4 g/dL (ref 30.0–36.0)
MCV: 97.4 fL (ref 78.0–100.0)
Platelets: 42 10*3/uL — ABNORMAL LOW (ref 150–400)
RDW: 15.5 % (ref 11.5–15.5)

## 2012-12-03 LAB — PRO B NATRIURETIC PEPTIDE: Pro B Natriuretic peptide (BNP): 297.7 pg/mL — ABNORMAL HIGH (ref 0–125)

## 2012-12-03 LAB — GLUCOSE, CAPILLARY: Glucose-Capillary: 149 mg/dL — ABNORMAL HIGH (ref 70–99)

## 2012-12-03 LAB — TROPONIN I: Troponin I: <0.3 ng/mL

## 2012-12-03 LAB — AMMONIA: Ammonia: 103 umol/L — ABNORMAL HIGH (ref 11–60)

## 2012-12-03 MED ORDER — INSULIN GLARGINE 100 UNIT/ML ~~LOC~~ SOLN
20.0000 [IU] | Freq: Every day | SUBCUTANEOUS | Status: DC
  Administered 2012-12-03 – 2012-12-10 (×8): 20 [IU] via SUBCUTANEOUS
  Filled 2012-12-03 (×11): qty 0.2

## 2012-12-03 MED ORDER — INSULIN ASPART 100 UNIT/ML ~~LOC~~ SOLN: 0.0000 [IU] | Freq: Every day | SUBCUTANEOUS | Status: DC

## 2012-12-03 MED ORDER — IPRATROPIUM BROMIDE 0.02 % IN SOLN: 0.5000 mg | RESPIRATORY_TRACT | Status: DC | PRN

## 2012-12-03 MED ORDER — INSULIN ASPART 100 UNIT/ML ~~LOC~~ SOLN
6.0000 [IU] | Freq: Three times a day (TID) | SUBCUTANEOUS | Status: DC
  Administered 2012-12-04 – 2012-12-11 (×20): 6 [IU] via SUBCUTANEOUS

## 2012-12-03 MED ORDER — SPIRONOLACTONE 100 MG PO TABS
100.0000 mg | ORAL_TABLET | Freq: Every day | ORAL | Status: DC
  Administered 2012-12-04 – 2012-12-11 (×8): 100 mg via ORAL
  Filled 2012-12-03 (×8): qty 1

## 2012-12-03 MED ORDER — PANTOPRAZOLE SODIUM 40 MG PO TBEC
40.0000 mg | DELAYED_RELEASE_TABLET | Freq: Every day | ORAL | Status: DC
  Administered 2012-12-03 – 2012-12-11 (×9): 40 mg via ORAL
  Filled 2012-12-03 (×9): qty 1

## 2012-12-03 MED ORDER — ONDANSETRON HCL 4 MG/2ML IJ SOLN: 4.0000 mg | Freq: Four times a day (QID) | INTRAMUSCULAR | Status: DC | PRN

## 2012-12-03 MED ORDER — ENOXAPARIN SODIUM 40 MG/0.4ML ~~LOC~~ SOLN: 40.0000 mg | SUBCUTANEOUS | Status: DC

## 2012-12-03 MED ORDER — SODIUM CHLORIDE 0.9 % IJ SOLN: 3.0000 mL | INTRAMUSCULAR | Status: DC | PRN

## 2012-12-03 MED ORDER — FOLIC ACID 1 MG PO TABS
1.0000 mg | ORAL_TABLET | Freq: Every day | ORAL | Status: DC
  Administered 2012-12-03 – 2012-12-11 (×9): 1 mg via ORAL
  Filled 2012-12-03 (×9): qty 1

## 2012-12-03 MED ORDER — ALPRAZOLAM 1 MG PO TABS
1.0000 mg | ORAL_TABLET | Freq: Every evening | ORAL | Status: DC | PRN
  Administered 2012-12-03 – 2012-12-06 (×3): 1 mg via ORAL
  Filled 2012-12-03 (×3): qty 1

## 2012-12-03 MED ORDER — ALBUTEROL SULFATE (5 MG/ML) 0.5% IN NEBU
2.5000 mg | INHALATION_SOLUTION | RESPIRATORY_TRACT | Status: DC
Start: 2012-12-03 — End: 2012-12-03
  Administered 2012-12-03: 2.5 mg via RESPIRATORY_TRACT
  Filled 2012-12-03: qty 0.5

## 2012-12-03 MED ORDER — LEVETIRACETAM 500 MG PO TABS
500.0000 mg | ORAL_TABLET | Freq: Two times a day (BID) | ORAL | Status: DC
  Administered 2012-12-03 – 2012-12-11 (×16): 500 mg via ORAL
  Filled 2012-12-03 (×17): qty 1

## 2012-12-03 MED ORDER — ALUM & MAG HYDROXIDE-SIMETH 200-200-20 MG/5ML PO SUSP: 30.0000 mL | Freq: Four times a day (QID) | ORAL | Status: DC | PRN

## 2012-12-03 MED ORDER — INSULIN ASPART 100 UNIT/ML ~~LOC~~ SOLN
0.0000 [IU] | Freq: Three times a day (TID) | SUBCUTANEOUS | Status: DC
  Administered 2012-12-04 – 2012-12-05 (×4): 4 [IU] via SUBCUTANEOUS
  Administered 2012-12-06 (×3): 3 [IU] via SUBCUTANEOUS
  Administered 2012-12-07: 6 [IU] via SUBCUTANEOUS
  Administered 2012-12-07: 3 [IU] via SUBCUTANEOUS
  Administered 2012-12-08 – 2012-12-09 (×2): 4 [IU] via SUBCUTANEOUS
  Administered 2012-12-09: 3 [IU] via SUBCUTANEOUS
  Administered 2012-12-10: 4 [IU] via SUBCUTANEOUS
  Administered 2012-12-10: 3 [IU] via SUBCUTANEOUS
  Administered 2012-12-11: 4 [IU] via SUBCUTANEOUS

## 2012-12-03 MED ORDER — ALBUTEROL SULFATE (5 MG/ML) 0.5% IN NEBU
INHALATION_SOLUTION | RESPIRATORY_TRACT | Status: AC
  Administered 2012-12-03: 2.5 mg
  Filled 2012-12-03: qty 0.5

## 2012-12-03 MED ORDER — RIFAXIMIN 550 MG PO TABS
550.0000 mg | ORAL_TABLET | Freq: Two times a day (BID) | ORAL | Status: DC
  Administered 2012-12-03 – 2012-12-11 (×16): 550 mg via ORAL
  Filled 2012-12-03 (×17): qty 1

## 2012-12-03 MED ORDER — IPRATROPIUM BROMIDE 0.02 % IN SOLN
RESPIRATORY_TRACT | Status: AC
  Administered 2012-12-03: 0.5 mg
  Filled 2012-12-03: qty 2.5

## 2012-12-03 MED ORDER — OXYCODONE HCL 5 MG PO TABS
10.0000 mg | ORAL_TABLET | ORAL | Status: DC | PRN
  Administered 2012-12-03 – 2012-12-11 (×41): 10 mg via ORAL
  Filled 2012-12-03 (×41): qty 2

## 2012-12-03 MED ORDER — SODIUM CHLORIDE 0.9 % IV SOLN
250.0000 mL | INTRAVENOUS | Status: DC | PRN
  Administered 2012-12-04 (×2): 250 mL via INTRAVENOUS

## 2012-12-03 MED ORDER — SODIUM CHLORIDE 0.9 % IJ SOLN
3.0000 mL | Freq: Two times a day (BID) | INTRAMUSCULAR | Status: DC
  Administered 2012-12-03 – 2012-12-09 (×10): 3 mL via INTRAVENOUS

## 2012-12-03 MED ORDER — RISPERIDONE 0.25 MG PO TABS
0.2500 mg | ORAL_TABLET | Freq: Two times a day (BID) | ORAL | Status: DC
  Administered 2012-12-03 – 2012-12-11 (×16): 0.25 mg via ORAL
  Filled 2012-12-03 (×17): qty 1

## 2012-12-03 MED ORDER — LEVOTHYROXINE SODIUM 75 MCG PO TABS
75.0000 ug | ORAL_TABLET | Freq: Every day | ORAL | Status: DC
  Administered 2012-12-04 – 2012-12-11 (×8): 75 ug via ORAL
  Filled 2012-12-03 (×9): qty 1

## 2012-12-03 MED ORDER — ALBUTEROL SULFATE (5 MG/ML) 0.5% IN NEBU: 2.5000 mg | INHALATION_SOLUTION | RESPIRATORY_TRACT | Status: DC | PRN

## 2012-12-03 MED ORDER — POTASSIUM CHLORIDE CRYS ER 20 MEQ PO TBCR
20.0000 meq | EXTENDED_RELEASE_TABLET | Freq: Two times a day (BID) | ORAL | Status: DC
  Administered 2012-12-03: 20 meq via ORAL
  Filled 2012-12-03 (×3): qty 1

## 2012-12-03 MED ORDER — LACTULOSE 10 GM/15ML PO SOLN
30.0000 g | Freq: Once | ORAL | Status: AC
  Administered 2012-12-03: 30 g via ORAL
  Filled 2012-12-03: qty 45

## 2012-12-03 MED ORDER — LACTULOSE 10 GM/15ML PO SOLN
30.0000 g | Freq: Every day | ORAL | Status: DC
  Administered 2012-12-03 – 2012-12-05 (×11): 30 g via ORAL
  Filled 2012-12-03 (×13): qty 45

## 2012-12-03 MED ORDER — ALBUTEROL SULFATE (5 MG/ML) 0.5% IN NEBU: 2.5000 mg | INHALATION_SOLUTION | RESPIRATORY_TRACT | Status: DC

## 2012-12-03 MED ORDER — FERROUS SULFATE 325 (65 FE) MG PO TABS
325.0000 mg | ORAL_TABLET | Freq: Every day | ORAL | Status: DC
  Administered 2012-12-03 – 2012-12-11 (×9): 325 mg via ORAL
  Filled 2012-12-03 (×9): qty 1

## 2012-12-03 MED ORDER — FUROSEMIDE 40 MG PO TABS
40.0000 mg | ORAL_TABLET | Freq: Two times a day (BID) | ORAL | Status: DC
  Administered 2012-12-03 – 2012-12-10 (×15): 40 mg via ORAL
  Filled 2012-12-03 (×16): qty 1

## 2012-12-03 MED ORDER — ONDANSETRON HCL 4 MG PO TABS: 4.0000 mg | ORAL_TABLET | Freq: Four times a day (QID) | ORAL | Status: DC | PRN

## 2012-12-03 MED ORDER — IPRATROPIUM BROMIDE 0.02 % IN SOLN
0.5000 mg | RESPIRATORY_TRACT | Status: DC
  Administered 2012-12-03: 0.5 mg via RESPIRATORY_TRACT
  Filled 2012-12-03: qty 2.5

## 2012-12-03 NOTE — ED Notes (Signed)
Bed: IO96 Expected date:  Expected time:  Means of arrival:  Comments: ems- mailase, hx of liver disease

## 2012-12-03 NOTE — Progress Notes (Signed)
Called to get report but no response.

## 2012-12-03 NOTE — ED Notes (Addendum)
PER EMS: pt c/o fatigue, nausea, and SOB. Hx of liver dz. 4mg  of Zofran.

## 2012-12-03 NOTE — H&P (Signed)
[facility] History and Physical  Shamell Suarez HYQ:[id number] DOB: 11-01-1955 DOA: 12/03/2012  Referring physician: Dr. Gwendolyn Grant PCP: [name], DO   Chief Complaint: SOB   History of Present Illness: Tony Boyd is an 57 y.o. male with a PMH of cirrhosis/ESLD secondary to ETOH abuse and hepatitis C who was brought to the hospital with an acute confusional state.  The patient tells me he has been short of breath since 6 a.m., and that he has had back and hip pain.  The patient tells me he lives in an independent living facility, and that 2 daughters provide his care including providing him with meals.  He tells me he takes 5 doses of Lactulose a day, and that he has been compliant with his medications.  He is noted to be tremulous and grossly volume overloaded on initial exam.  Pro-BNP is elevated at 297.7.  He has not had a 2-D Echocardiogram.  He has not had any alcohol in over a year.  He is otherwise alert and conversant.  Upon initial evaluation in the ER, the patient was found to have some atelectasis and vascular congestion on a CXR, and his ammonia levels were elevated, so he was referred for inpatient evaluation.  He has had 8 admissions in the past 6 months.  Review of Systems: Constitutional: No fever, no chills;  Appetite diminished x 1 day; + weight loss, no weight gain, no fatigue.  HEENT: No blurry vision, no diplopia, no pharyngitis, no dysphagia CV: No chest pain, no palpitations.  Resp: + SOB x 12 hours, no cough. GI: No nausea, no vomiting, no diarrhea, no melena, no hematochezia.  GU: No dysuria, no hematuria. MSK: no myalgias, + back and hip arthralgias.  Neuro:  No headache, no focal neurological deficits, + history of seizures.  Psych: No depression, no anxiety, + h/o psychosis.  Endo: No heat intolerance, no cold intolerance, no polyuria, no polydipsia  Skin: No rashes, no skin lesions.  Heme: No easy bruising.  Past Medical History Past Medical History  Diagnosis  Date  . Cirrhosis of liver   . Hepatitis C   . ETOH abuse   . Hypothyroid   . Psychosis   . Bipolar disorder   . Seizures   . Arthritis   . Back pain   . Coagulopathy     Hx of  . Thrombocytopenia     Hx of  . Pancytopenia     Hx of  . H/O hypokalemia   . Hyponatremia     Hx of  . Korsakoff psychosis   . H/O abdominal abscess   . H/O esophageal varices   . Metabolic encephalopathy   . Hepatic encephalopathy   . Urinary urgency   . H/O renal failure   . H/O ascites   . Altered mental status   . Anemia   . Ascites   . Shortness of breath   . Peripheral vascular disease   . GERD (gastroesophageal reflux disease)   . Thrombocytopenia, acquired 06/02/2012  . Unspecified hypothyroidism 06/02/2012  . Pain in joint, pelvic region and thigh   . Edema   . Obesity, unspecified   . Chest pain, unspecified   . Pneumonitis due to inhalation of food or vomitus   . Other convulsions   . Cellulitis and abscess of upper arm and forearm   . Chronic hepatitis C without mention of hepatic coma   . Hypopotassemia   . Anemia, unspecified   . Other and unspecified coagulation  defects   . Thrombocytopenia, unspecified   . Bipolar I disorder, most recent episode (or current) unspecified   . Unspecified psychosis   . Encephalopathy   . Esophageal varices without mention of bleeding   . Esophagitis, unspecified   . Abscess of liver(572.0)   . Chronic kidney disease, unspecified   . Lumbago   . Personal history of alcoholism   . Personal history of tobacco use, presenting hazards to health   . Cirrhosis of liver without mention of alcohol   . Pneumonitis due to inhalation of food or vomitus 09/19/2010  . Chronic hepatitis C without mention of hepatic coma   . Bipolar I disorder, most recent episode (or current) unspecified   . Unspecified psychosis   . Other ascites   . Personal history of tobacco use, presenting hazards to health   . Cirrhosis of liver without mention of alcohol   .  Type 2 diabetes mellitus with diabetic neuropathy      Past Surgical History Past Surgical History  Procedure Laterality Date  . Colostomy    . Cholecystectomy    . Small intestine surgery    . Arm surgery      left arm  . US guided drain placement in ventral hernia abscess  07/21/2010  . Multiple picc line placements    . Esophagogastroduodenoscopy  06/28/2011    Procedure: ESOPHAGOGASTRODUODENOSCOPY (EGD);  Surgeon: Louis Meckel, MD;  Location: [facility];  Service: Endoscopy;  Laterality: N/A;  . Esophagogastroduodenoscopy N/A 05/06/2012    Procedure: ESOPHAGOGASTRODUODENOSCOPY (EGD);  Surgeon: Louis Meckel, MD;  Location: [facility];  Service: Endoscopy;  Laterality: N/A;  . Esophageal banding N/A 05/06/2012    Procedure: ESOPHAGEAL BANDING;  Surgeon: Louis Meckel, MD;  Location: [facility];  Service: Endoscopy;  Laterality: N/A;     Social History: History   Social History  . Marital Status: Single    Spouse Name: N/A    Number of Children: 2  . Years of Education: N/A   Occupational History  . Disabled.  Former Marine/construction work    Social History Main Topics  . Smoking status: Former Smoker    Types: Cigarettes    Quit date: 02/15/2008  . Smokeless tobacco: Never Used  . Alcohol Use: No     Comment: Quit drinking 1 year ago.    . Drug Use: No  . Sexual Activity: No   Other Topics Concern  . Not on file   Social History Narrative   Resident of ALF.  States he is widowed.  Ambulates with a walker.    Family History:  Family History  Problem Relation Age of Onset  . Heart disease Mother     MI  . Lung cancer Brother     Allergies: Ativan; Droperidol; Ketorolac; Penicillins; and Toradol  Meds: Prior to Admission medications   Medication Sig Start Date End Date Taking? Authorizing Provider  ALPRAZolam Prudy Feeler) 1 MG tablet Take 1 tablet (1 mg total) by mouth at bedtime as needed for sleep. 10/31/12  Yes Tiffany L Reed, DO  ferrous  sulfate 325 (65 FE) MG tablet Take 325 mg by mouth at bedtime.   Yes Historical Provider, MD  folic acid (FOLVITE) 1 MG tablet Take 1 mg by mouth daily.   Yes Historical Provider, MD  furosemide (LASIX) 40 MG tablet Take 40 mg by mouth 2 (two) times daily.   Yes Historical Provider, MD  insulin aspart (NOVOLOG) 100 UNIT/ML injection Inject 6 Units into the  skin 3 (three) times daily with meals.   Yes Historical Provider, MD  insulin glargine (LANTUS) 100 UNIT/ML injection Inject 20 Units into the skin at bedtime.   Yes Historical Provider, MD  lactulose (CHRONULAC) 10 GM/15ML solution Take 45 mLs (30 g total) by mouth 5 (five) times daily. 11/14/12  Yes Catarina Hartshorn, MD  levETIRAcetam (KEPPRA) 500 MG tablet Take 500 mg by mouth 2 (two) times daily.   Yes Historical Provider, MD  levothyroxine (SYNTHROID, LEVOTHROID) 75 MCG tablet Take 75 mcg by mouth daily.   Yes Historical Provider, MD  omeprazole (PRILOSEC) 20 MG capsule Take 20 mg by mouth daily.   Yes Historical Provider, MD  Oxycodone HCl 10 MG TABS Take 1 tablet (10 mg total) by mouth every 4 (four) hours. 11/28/12  Yes Tiffany L Reed, DO  potassium chloride (K-DUR,KLOR-CON) 10 MEQ tablet Take 10 mEq by mouth daily.   Yes Historical Provider, MD  rifaximin (XIFAXAN) 550 MG TABS Take 550 mg by mouth 2 (two) times daily. 01/26/12  Yes Alison Murray, MD  risperiDONE (RISPERDAL) 0.25 MG tablet Take 0.25 mg by mouth 2 (two) times daily.   Yes Historical Provider, MD  spironolactone (ALDACTONE) 100 MG tablet Take 100 mg by mouth daily.   Yes Historical Provider, MD    Physical Exam: Filed Vitals:   12/03/12 1351 12/03/12 1356 12/03/12 1703  BP:  106/53   Pulse:  86   Temp:  98.2 F (36.8 C)   TempSrc:  Oral   Resp:  16   SpO2: 100% 100% 100%     Physical Exam: Blood pressure 106/53, pulse 86, temperature 98.2 F (36.8 C), temperature source Oral, resp. rate 16, SpO2 100.00%. Gen: No acute distress.  Awake, alert. Head: Normocephalic,  atraumatic. Eyes: PERRL, EOMI, sclerae nonicteric. Mouth: Oropharynx clear.  No posterior pharyngeal exudates. Neck: Supple, no thyromegaly, no lymphadenopathy, no jugular venous distention. Chest: Lungs diminished throughout. CV: Heart sounds are regular.  No M/R/G. Abdomen: Distended with normal active bowel sounds. Extremities: Extremities are with 3 + edema. Skin: Warm and dry. Neuro: Alert and oriented to self, place, day of week and month; cranial nerves II through XII grossly intact. Psych: Mood and affect normal.  Labs on Admission:  Basic Metabolic Panel:  Recent Labs Lab 12/03/12 1435  NA 124*  K 3.0*  CL 90*  CO2 21  GLUCOSE 174*  BUN 14  CREATININE 0.77  CALCIUM 8.1*   Liver Function Tests:  Recent Labs Lab 12/03/12 1503  AST 61*  ALT 28  ALKPHOS 247*  BILITOT 2.8*  PROT 7.4  ALBUMIN 2.4*    Recent Labs Lab 12/03/12 1530  AMMONIA 103*   CBC:  Recent Labs Lab 12/03/12 1530  WBC 4.4  HGB 10.7*  HCT 30.2*  MCV 97.4  PLT 42*   Cardiac Enzymes:  Recent Labs Lab 12/03/12 1530  TROPONINI <0.30    BNP (last 3 results)  Recent Labs  12/03/12 1435  PROBNP 297.7*   CBG: No results found for this basename: GLUCAP,  in the last 168 hours  Radiological Exams on Admission: Dg Chest 2 View  12/03/2012   CLINICAL DATA:  Hepatitis-C. Fatigue. Short of breath.  EXAM: CHEST  2 VIEW  COMPARISON:  11/12/2012  FINDINGS: Lungs are under aerated with bibasilar atelectasis. Mild cardiomegaly and vascular congestion without evidence of interstitial edema. No pleural effusion. No pneumothorax.  IMPRESSION: Mild cardiomegaly and vascular congestion without edema.  Bibasilar atelectasis.   Electronically Signed  By: Maryclare Bean M.D.   On: 12/03/2012 15:20    EKG: Independently reviewed. NSR at 82 bpm, PACs, borderline prolonged QTc.  Assessment/Plan Principal Problem:   Dyspnea, rule out CHF in a patient with elevated pro-BNP, anasarca -Elevated  pro-BNP and diffuse anasarca noted, rule out CHF, obtain Echo. -Alternatively, could have ascites with abdominal distention causing dyspnea.  May benefit from ultrasound/paracentesis.  Will ask IR to perform ultrasound and perform paracentesis if significant ascites found. -Continue current doses of Lasix and Spironolactone.  Check daily weights/strict I&O. Active Problems:   Hepatic encephalopathy -Ammonia levels up, patient tremulous but awake and alert, not currently confused. -Continue current doses of Lactulose and Rifaximin.   Hyponatremia -Likely secondary to cirrhosis physiology. Continue diuretic therapy.   Cirrhosis -Appears to be end stage.  Has significant thrombocytopenia, elevated bilirubin and low albumin.  H/O coagulopathy as well.   Thrombocytopenia, acquired -Avoid LMWH.  Use SCDs for DVT prophylaxis.   Hypothyroidism -Continue Synthroid.  TSH recently checked and was 3.470.   Diabetes mellitus type 2, uncontrolled, with complications -Continue Lantus 20 units daily and 6 units of NovoLog Q AC.  Add SSI.   Hypokalemia -Secondary to diuretics.  Increase supplementation dose.   Normocytic anemia -Likely anemia of chronic disease.   Seizure disorder -Continue Keppra   H/O bipolar disorder -Continue Risperdal.  Code Status: Limited: No CPR, intubation, defibrillation. Family Communication: No family at bedside. Disposition Plan: Home versus SNF depending on progress.  Time spent: 1 hour.  RAMA,CHRISTINA [facility] Pager [phone]  If 7PM-7AM, please contact night-coverage [email or web address] Password [email or web address] 12/03/2012, 5:08 PM

## 2012-12-03 NOTE — ED Provider Notes (Signed)
CSN: [id number]     Arrival date & time 12/03/12  1351 History   First MD Initiated Contact with Patient 12/03/12 1402     Chief Complaint  Patient presents with  . Fatigue  . Nausea  . Shortness of Breath   (Consider location/radiation/quality/duration/timing/severity/associated sxs/prior Treatment) Patient is a 57 y.o. male presenting with shortness of breath. The history is provided by the patient.  Shortness of Breath Severity:  Moderate Onset quality:  Sudden Timing:  Constant Progression:  Unchanged Chronicity:  New Context: not URI and not weather changes   Relieved by:  Nothing Worsened by:  Nothing tried Ineffective treatments:  None tried Associated symptoms: no chest pain, no cough and no fever     Past Medical History  Diagnosis Date  . Cirrhosis of liver   . Hepatitis C   . ETOH abuse   . Hypothyroid   . Psychosis   . Bipolar disorder   . Seizures   . Arthritis   . Back pain   . Coagulopathy     Hx of  . Thrombocytopenia     Hx of  . Pancytopenia     Hx of  . H/O hypokalemia   . Hyponatremia     Hx of  . Korsakoff psychosis   . H/O abdominal abscess   . H/O esophageal varices   . Metabolic encephalopathy   . Hepatic encephalopathy   . Urinary urgency   . H/O renal failure   . H/O ascites   . Altered mental status   . Anemia   . Ascites   . Shortness of breath   . Peripheral vascular disease   . GERD (gastroesophageal reflux disease)   . Thrombocytopenia, acquired 06/02/2012  . Unspecified hypothyroidism 06/02/2012  . Pain in joint, pelvic region and thigh   . Edema   . Obesity, unspecified   . Chest pain, unspecified   . Pneumonitis due to inhalation of food or vomitus   . Other convulsions   . Cellulitis and abscess of upper arm and forearm   . Chronic hepatitis C without mention of hepatic coma   . Hypopotassemia   . Anemia, unspecified   . Other and unspecified coagulation defects   . Thrombocytopenia, unspecified   . Bipolar I  disorder, most recent episode (or current) unspecified   . Unspecified psychosis   . Encephalopathy   . Esophageal varices without mention of bleeding   . Esophagitis, unspecified   . Abscess of liver(572.0)   . Chronic kidney disease, unspecified   . Lumbago   . Personal history of alcoholism   . Personal history of tobacco use, presenting hazards to health   . Cirrhosis of liver without mention of alcohol   . Pneumonitis due to inhalation of food or vomitus 09/19/2010  . Chronic hepatitis C without mention of hepatic coma   . Bipolar I disorder, most recent episode (or current) unspecified   . Unspecified psychosis   . Other ascites   . Personal history of tobacco use, presenting hazards to health   . Cirrhosis of liver without mention of alcohol   . Type 2 diabetes mellitus with diabetic neuropathy    Past Surgical History  Procedure Laterality Date  . Colostomy    . Cholecystectomy    . Small intestine surgery    . Arm surgery      left arm  . US guided drain placement in ventral hernia abscess  07/21/2010  . Multiple picc line placements    .  Esophagogastroduodenoscopy  06/28/2011    Procedure: ESOPHAGOGASTRODUODENOSCOPY (EGD);  Surgeon: Louis Meckel, MD;  Location: [facility];  Service: Endoscopy;  Laterality: N/A;  . Esophagogastroduodenoscopy N/A 05/06/2012    Procedure: ESOPHAGOGASTRODUODENOSCOPY (EGD);  Surgeon: Louis Meckel, MD;  Location: [facility];  Service: Endoscopy;  Laterality: N/A;  . Esophageal banding N/A 05/06/2012    Procedure: ESOPHAGEAL BANDING;  Surgeon: Louis Meckel, MD;  Location: [facility];  Service: Endoscopy;  Laterality: N/A;   Family History  Problem Relation Age of Onset  . Heart disease Mother     MI  . Lung cancer Brother    History  Substance Use Topics  . Smoking status: Former Smoker    Types: Cigarettes    Quit date: 02/15/2008  . Smokeless tobacco: Never Used  . Alcohol Use: No    Review of Systems   Constitutional: Negative for fever and chills.  Respiratory: Positive for shortness of breath. Negative for cough.   Cardiovascular: Positive for leg swelling. Negative for chest pain.  All other systems reviewed and are negative.    Allergies  Ativan; Droperidol; Ketorolac; Penicillins; and Toradol  Home Medications   Current Outpatient Rx  Name  Route  Sig  Dispense  Refill  . ALPRAZolam (XANAX) 1 MG tablet   Oral   Take 1 tablet (1 mg total) by mouth at bedtime as needed for sleep.   30 tablet   5   . ferrous sulfate 325 (65 FE) MG tablet   Oral   Take 325 mg by mouth at bedtime.         . folic acid (FOLVITE) 1 MG tablet   Oral   Take 1 mg by mouth daily.         . furosemide (LASIX) 40 MG tablet   Oral   Take 40 mg by mouth 2 (two) times daily.         . insulin aspart (NOVOLOG) 100 UNIT/ML injection   Subcutaneous   Inject 6 Units into the skin 3 (three) times daily with meals.         . insulin glargine (LANTUS) 100 UNIT/ML injection   Subcutaneous   Inject 20 Units into the skin at bedtime.         [name] lactulose (CHRONULAC) 10 GM/15ML solution   Oral   Take 45 mLs (30 g total) by mouth 5 (five) times daily.   1892 mL   11   . levETIRAcetam (KEPPRA) 500 MG tablet   Oral   Take 500 mg by mouth 2 (two) times daily.         [name] levothyroxine (SYNTHROID, LEVOTHROID) 75 MCG tablet   Oral   Take 75 mcg by mouth daily.         [name] omeprazole (PRILOSEC) 20 MG capsule   Oral   Take 20 mg by mouth daily.         . Oxycodone HCl 10 MG TABS   Oral   Take 1 tablet (10 mg total) by mouth every 4 (four) hours.   180 tablet   0   . potassium chloride (K-DUR,KLOR-CON) 10 MEQ tablet   Oral   Take 10 mEq by mouth daily.         . rifaximin (XIFAXAN) 550 MG TABS   Oral   Take 550 mg by mouth 2 (two) times daily.         . risperiDONE (RISPERDAL) 0.25 MG tablet   Oral   Take 0.25  mg by mouth 2 (two) times daily.         [name] spironolactone  (ALDACTONE) 100 MG tablet   Oral   Take 100 mg by mouth daily.          BP 106/53  Pulse 86  Temp(Src) 98.2 F (36.8 C) (Oral)  Resp 16  SpO2 100% Physical Exam  Nursing note and vitals reviewed. Constitutional: He appears well-developed and well-nourished. No distress.  HENT:  Head: Normocephalic and atraumatic.  Mouth/Throat: No oropharyngeal exudate.  Eyes: EOM are normal. Pupils are equal, round, and reactive to light.  Neck: Normal range of motion. Neck supple.  Cardiovascular: Normal rate and regular rhythm.  Exam reveals no friction rub.   No murmur heard. Pulmonary/Chest: Effort normal and breath sounds normal. No respiratory distress. He has no wheezes. He has no rales.  Abdominal: He exhibits distension. There is no tenderness. There is no rebound.  Musculoskeletal: Normal range of motion. He exhibits edema (bilateral leg edema, chronic, non-pitting).  Neurological: He is alert. He exhibits normal muscle tone.  Oriented to place and situation, not with year  Skin: No rash noted. He is not diaphoretic.    ED Course  Procedures (including critical care time) Labs Review Labs Reviewed  BASIC METABOLIC PANEL - Abnormal; Notable for the following:    Sodium 124 (*)    Potassium 3.0 (*)    Chloride 90 (*)    Glucose, Bld 174 (*)    Calcium 8.1 (*)    All other components within normal limits  PRO B NATRIURETIC PEPTIDE - Abnormal; Notable for the following:    Pro B Natriuretic peptide (BNP) 297.7 (*)    All other components within normal limits  HEPATIC FUNCTION PANEL - Abnormal; Notable for the following:    Albumin 2.4 (*)    AST 61 (*)    Alkaline Phosphatase 247 (*)    Total Bilirubin 2.8 (*)    All other components within normal limits  CBC - Abnormal; Notable for the following:    RBC 3.10 (*)    Hemoglobin 10.7 (*)    HCT 30.2 (*)    MCH 34.5 (*)    Platelets 42 (*)    All other components within normal limits  AMMONIA - Abnormal; Notable for the  following:    Ammonia 103 (*)    All other components within normal limits  TROPONIN I   Imaging Review Dg Chest 2 View  12/03/2012   CLINICAL DATA:  Hepatitis-C. Fatigue. Short of breath.  EXAM: CHEST  2 VIEW  COMPARISON:  11/12/2012  FINDINGS: Lungs are under aerated with bibasilar atelectasis. Mild cardiomegaly and vascular congestion without evidence of interstitial edema. No pleural effusion. No pneumothorax.  IMPRESSION: Mild cardiomegaly and vascular congestion without edema.  Bibasilar atelectasis.   Electronically Signed   By: Maryclare Bean M.D.   On: 12/03/2012 15:20     Date: 12/03/2012  Rate: 82  Rhythm: normal sinus rhythm  QRS Axis: left  Intervals: normal  ST/T Wave abnormalities: normal  Conduction Disutrbances:none  Narrative Interpretation:   Old EKG Reviewed: unchanged   MDM   1. Shortness of breath   2. Hyperammonemia    25M with hx of liver disease presents with SOB. SOB started acutely a few hours ago. Denies CP. Patient also states some weakness and fatigue. He is alert to place, not to year. I saw him on his last presentation and he was much more confused at that time. AFVSS  here. No tachypnea, no hypoxia. No tachycardia. Patient has clear lungs. Belly distended, soft, nontender. Legs with edema bilaterally, chronic.  Will check labs including troponin. EKG shows normal sinus rhythm. Labs show elevated ammonia. I spoke with daughter who stated he was looking frail today, minimally confused. She states recent increase in his lactulose by his PCP.  Admitted to SOB, confusion.    Dagmar Hait, MD 12/03/12 [id number]

## 2012-12-03 NOTE — ED Notes (Signed)
Patient transported to X-ray 

## 2012-12-04 ENCOUNTER — Inpatient Hospital Stay (HOSPITAL_COMMUNITY): Payer: Medicare Other

## 2012-12-04 DIAGNOSIS — I8500 Esophageal varices without bleeding: Secondary | ICD-10-CM

## 2012-12-04 DIAGNOSIS — E7220 Disorder of urea cycle metabolism, unspecified: Secondary | ICD-10-CM

## 2012-12-04 DIAGNOSIS — K7460 Unspecified cirrhosis of liver: Secondary | ICD-10-CM

## 2012-12-04 DIAGNOSIS — Z8659 Personal history of other mental and behavioral disorders: Secondary | ICD-10-CM

## 2012-12-04 DIAGNOSIS — K7290 Hepatic failure, unspecified without coma: Secondary | ICD-10-CM

## 2012-12-04 DIAGNOSIS — I501 Left ventricular failure: Secondary | ICD-10-CM

## 2012-12-04 DIAGNOSIS — E1165 Type 2 diabetes mellitus with hyperglycemia: Secondary | ICD-10-CM

## 2012-12-04 DIAGNOSIS — E876 Hypokalemia: Secondary | ICD-10-CM

## 2012-12-04 DIAGNOSIS — E871 Hypo-osmolality and hyponatremia: Secondary | ICD-10-CM

## 2012-12-04 DIAGNOSIS — R609 Edema, unspecified: Secondary | ICD-10-CM

## 2012-12-04 DIAGNOSIS — E119 Type 2 diabetes mellitus without complications: Secondary | ICD-10-CM

## 2012-12-04 DIAGNOSIS — I2510 Atherosclerotic heart disease of native coronary artery without angina pectoris: Secondary | ICD-10-CM

## 2012-12-04 DIAGNOSIS — D638 Anemia in other chronic diseases classified elsewhere: Secondary | ICD-10-CM

## 2012-12-04 DIAGNOSIS — I10 Essential (primary) hypertension: Secondary | ICD-10-CM

## 2012-12-04 LAB — BASIC METABOLIC PANEL
BUN: 14 mg/dL (ref 6–23)
CO2: 29 mEq/L (ref 19–32)
Chloride: 89 mEq/L — ABNORMAL LOW (ref 96–112)
Creatinine, Ser: 0.78 mg/dL (ref 0.50–1.35)
GFR calc Af Amer: >90 mL/min (ref >90)
GFR calc non Af Amer: >90 mL/min (ref >90)
Potassium: 2.5 mEq/L — CL (ref 3.5–5.1)
Sodium: 125 mEq/L — ABNORMAL LOW (ref 135–145)

## 2012-12-04 LAB — GLUCOSE, CAPILLARY
Glucose-Capillary: 109 mg/dL — ABNORMAL HIGH (ref 70–99)
Glucose-Capillary: 188 mg/dL — ABNORMAL HIGH (ref 70–99)
Glucose-Capillary: 168 mg/dL — ABNORMAL HIGH (ref 70–99)
Glucose-Capillary: 132 mg/dL — ABNORMAL HIGH (ref 70–99)

## 2012-12-04 MED ORDER — MAGNESIUM SULFATE 50 % IJ SOLN
3.0000 g | Freq: Once | INTRAVENOUS | Status: AC
  Administered 2012-12-04: 3 g via INTRAVENOUS
  Filled 2012-12-04: qty 6

## 2012-12-04 MED ORDER — POTASSIUM CHLORIDE 10 MEQ/100ML IV SOLN
10.0000 meq | INTRAVENOUS | Status: AC
  Administered 2012-12-04 (×3): 10 meq via INTRAVENOUS
  Filled 2012-12-04 (×3): qty 100

## 2012-12-04 MED ORDER — NADOLOL 20 MG PO TABS
20.0000 mg | ORAL_TABLET | Freq: Every day | ORAL | Status: DC
  Administered 2012-12-04 – 2012-12-08 (×5): 20 mg via ORAL
  Filled 2012-12-04 (×5): qty 1

## 2012-12-04 MED ORDER — POTASSIUM CHLORIDE CRYS ER 20 MEQ PO TBCR
30.0000 meq | EXTENDED_RELEASE_TABLET | Freq: Two times a day (BID) | ORAL | Status: DC
  Administered 2012-12-04 – 2012-12-05 (×3): 30 meq via ORAL
  Filled 2012-12-04 (×4): qty 1

## 2012-12-04 MED ORDER — POTASSIUM CHLORIDE CRYS ER 20 MEQ PO TBCR
40.0000 meq | EXTENDED_RELEASE_TABLET | ORAL | Status: AC
  Administered 2012-12-04: 40 meq via ORAL
  Filled 2012-12-04: qty 2

## 2012-12-04 NOTE — Progress Notes (Signed)
*  PRELIMINARY RESULTS* Echocardiogram 2D Echocardiogram has been performed.  Tony Boyd 12/04/2012, 4:22 PM

## 2012-12-04 NOTE — Progress Notes (Signed)
PT Cancellation Note  Patient Details Name: Tony Boyd MRN: [id number] DOB: 20-Jul-1955   Cancelled Treatment:    Reason Eval/Treat Not Completed: Patient at procedure or test/unavailable   Rada Hay 12/04/2012, 4:51 PM

## 2012-12-04 NOTE — Progress Notes (Signed)
[facility] PROGRESS NOTE  Tony Boyd ZOX:[id number] DOB: December 20, 1955 DOA: 12/03/2012 PCP: [name], DO  Assessment/Plan: Dyspnea, rule out CHF in a patient with elevated pro-BNP, anasarca  -Elevated pro-BNP and diffuse anasarca noted, rule out CHF, obtain Echo.  -Abdominal Distention (ascites?); Pt presented to [place] dept today for possible paracentesis. On [place] abd in all four quadrants there is no significant ascites present. Procedure was cancelled. -Continue current doses of Lasix and Spironolactone. Check daily weights/strict I&O.     Hepatic encephalopathy  -Ammonia levels up; obtain repeat level in AM (most of confusion/asterixis resolved)  -Continue current doses of Lactulose and Rifaximin( xifaxan ).   Esophageal varices -Review of EPIC shows patient visited Dr. Melvia Heaps (gastroenterologist) on   04/15/2012 patient was instructed to follow    a.  begin nadolol 40 mg daily (unsure if patient's BP will tolerate full dose start at 20mg  daily)   b. Repeat endoscopy (timeframe not mentioned); contact Dr. Arlyce Dice to discuss patient   c. check serologies for hepatitis A and B. and vaccinate accordingly (obtain hepatitis panel)   Hyponatremia  -Likely secondary to cirrhosis physiology. Continue diuretic therapy.   Cirrhosis  -Appears to be end stage. Has significant thrombocytopenia, elevated bilirubin and low albumin. H/O coagulopathy as well.   Thrombocytopenia, acquired  -Avoid LMWH. Use SCDs for DVT prophylaxis.   Hypothyroidism  -Continue Synthroid. TSH recently checked and was 3.470.   Diabetes mellitus type 2, uncontrolled, with complications  -Continue Lantus 20 units daily and 6 units of NovoLog Q AC. Add SSI.   Hypokalemia  -Secondary to diuretics. Increase supplementation dose.  -Replete with IV potassium - one hour After completion obtain repeat BMP  Hypomagnesemia -Replete with IV magnesium, obtain repeat magnesium one hour after  completion  Normocytic anemia  -Likely anemia of chronic disease.   Seizure disorder  -Continue Keppra   H/O bipolar disorder  -Continue Risperdal.    Code Status:  Family Communication:  Disposition Plan:    Consultants:   Procedures:  Echocardiogram; results pending    Antibiotics:  Rifaximin(hepatic encephalopathy)   HPI/Subjective: Tony Boyd is an 57 y.o. male with a PMH of cirrhosis/ESLD secondary to ETOH abuse and hepatitis C who was brought to the hospital with an acute confusional state. The patient tells me he has been short of breath since 6 a.m., and that he has had back and hip pain. The patient tells me he lives in an independent living facility, and that 2 daughters provide his care including providing him with meals. He tells me he takes 5 doses of Lactulose a day, and that he has been compliant with his medications. He is noted to be tremulous and grossly volume overloaded on initial exam. Pro-BNP is elevated at 297.7. He has not had a 2-D Echocardiogram. He has not had any alcohol in over a year. He is otherwise alert and conversant. Upon initial evaluation in the ER, the patient was found to have some atelectasis and vascular congestion on a CXR, and his ammonia levels were elevated, so he was referred for inpatient evaluation. He has had 8 admissions in the past 6 months. TODAY patient states he is feeling much better like to know how long he needs to remain in the hospital.   Patient feels he is back to baseline with his cognitive ability, and that his asterixis has cleared.   Objective: Filed Vitals:   12/03/12 1850 12/03/12 1900 12/03/12 2107 12/04/12 0614  BP: 127/72 117/70  105/65  Pulse:  93 81  72  Temp: 98.6 F (37 C) 98.4 F (36.9 C)  98.4 F (36.9 C)  TempSrc: Oral Oral  Oral  Resp:  20  18  Height: 5\' 8"  (1.727 m)     Weight: 108 kg (238 lb 1.6 oz)   113.399 kg (250 lb)  SpO2: 96% 100% 98% 100%    Intake/Output Summary (Last 24  hours) at 12/04/12 1445 Last data filed at 12/04/12 0948  Gross per 24 hour  Intake    480 ml  Output      0 ml  Net    480 ml   Filed Weights   12/03/12 1850 12/04/12 0614  Weight: 108 kg (238 lb 1.6 oz) 113.399 kg (250 lb)    Exam:   General:  A./O. x4,NAD  Cardiovascular: Regular rhythm and rate, negative murmurs rubs or gallops,  Respiratory: Clear to auscultation bilateral  Abdomen: Distended, nontender, plus bowel sounds  Musculoskeletal: Bilateral pedal edema 1-2+ to mid shin   Data Reviewed: Basic Metabolic Panel:  Recent Labs Lab 12/03/12 1435 12/04/12 0523  NA 124* 125*  K 3.0* 2.5*  CL 90* 89*  CO2 21 29  GLUCOSE 174* 127*  BUN 14 14  CREATININE 0.77 0.78  CALCIUM 8.1* 7.8*  MG  --  1.0*   Liver Function Tests:  Recent Labs Lab 12/03/12 1503  AST 61*  ALT 28  ALKPHOS 247*  BILITOT 2.8*  PROT 7.4  ALBUMIN 2.4*   No results found for this basename: LIPASE, AMYLASE,  in the last 168 hours  Recent Labs Lab 12/03/12 1530  AMMONIA 103*   CBC:  Recent Labs Lab 12/03/12 1530  WBC 4.4  HGB 10.7*  HCT 30.2*  MCV 97.4  PLT 42*   Cardiac Enzymes:  Recent Labs Lab 12/03/12 1530  TROPONINI <0.30   BNP (last 3 results)  Recent Labs  12/03/12 1435  PROBNP 297.7*   CBG:  Recent Labs Lab 12/03/12 2058 12/04/12 0747 12/04/12 1132  GLUCAP 149* 109* 188*    No results found for this or any previous visit (from the past 240 hour(s)).   Studies: Dg Chest 2 View  12/03/2012   CLINICAL DATA:  Hepatitis-C. Fatigue. Short of breath.  EXAM: CHEST  2 VIEW  COMPARISON:  11/12/2012  FINDINGS: Lungs are under aerated with bibasilar atelectasis. Mild cardiomegaly and vascular congestion without evidence of interstitial edema. No pleural effusion. No pneumothorax.  IMPRESSION: Mild cardiomegaly and vascular congestion without edema.  Bibasilar atelectasis.   Electronically Signed   By: Maryclare Bean M.D.   On: 12/03/2012 15:20   US Abdomen  Limited  12/04/2012   CLINICAL DATA:  Cirrhosis, assessment for ascites and drainage  EXAM: LIMITED ABDOMEN ULTRASOUND FOR ASCITES  TECHNIQUE: Limited ultrasound survey for ascites was performed in all four abdominal quadrants.  COMPARISON:  10/15/2012  FINDINGS: No significant ascites identified.  Paracentesis not performed.  IMPRESSION: No significant ascites identified.   Electronically Signed   By: Ulyses Southward M.D.   On: 12/04/2012 14:16    Scheduled Meds: . ferrous sulfate  325 mg Oral QHS  . folic acid  1 mg Oral Daily  . furosemide  40 mg Oral BID  . insulin aspart  0-20 Units Subcutaneous TID WC  . insulin aspart  0-5 Units Subcutaneous QHS  . insulin aspart  6 Units Subcutaneous TID WC  . insulin glargine  20 Units Subcutaneous QHS  . lactulose  30 g Oral 5 X Daily  .  levETIRAcetam  500 mg Oral BID  . levothyroxine  75 mcg Oral QAC breakfast  . pantoprazole  40 mg Oral Daily  . potassium chloride  30 mEq Oral BID  . rifaximin  550 mg Oral BID  . risperiDONE  0.25 mg Oral BID  . sodium chloride  3 mL Intravenous Q12H  . spironolactone  100 mg Oral Daily   Continuous Infusions:   Principal Problem:   Dyspnea, rule out CHF in a patient with elevated pro-BNP, anasarca Active Problems:   Hepatic encephalopathy   Hyponatremia   Cirrhosis   Seizure disorder   Thrombocytopenia, acquired   Hypothyroidism   Diabetes mellitus type 2, uncontrolled, with complications   Anasarca   Hypokalemia   Normocytic anemia   H/O bipolar disorder    Time spent: 40 minutes    WOODS, CURTIS, J  [facility] Pager [phone]. If 7PM-7AM, please contact night-coverage at [email or web address], password [facility] 12/04/2012, 2:45 PM  LOS: 1 day

## 2012-12-04 NOTE — Progress Notes (Signed)
Patient ID: Tony Boyd, male   DOB: 02/12/1956, 57 y.o.   MRN: [id number] Pt presented to [place] dept today for possible paracentesis. On [place] abd in all four quadrants there is no significant ascites present. Procedure was cancelled. Pt informed.

## 2012-12-05 DIAGNOSIS — D6959 Other secondary thrombocytopenia: Secondary | ICD-10-CM

## 2012-12-05 DIAGNOSIS — G40909 Epilepsy, unspecified, not intractable, without status epilepticus: Secondary | ICD-10-CM

## 2012-12-05 LAB — CBC WITH DIFFERENTIAL/PLATELET
Eosinophils Absolute: 0.3 10*3/uL (ref 0.0–0.7)
Eosinophils Relative: 7 % — ABNORMAL HIGH (ref 0–5)
Hemoglobin: 9.3 g/dL — ABNORMAL LOW (ref 13.0–17.0)
Lymphocytes Relative: 20 % (ref 12–46)
Lymphs Abs: 0.7 10*3/uL (ref 0.7–4.0)
MCH: 34.4 pg — ABNORMAL HIGH (ref 26.0–34.0)
MCV: 97.8 fL (ref 78.0–100.0)
Monocytes Relative: 12 % (ref 3–12)
Platelets: 42 10*3/uL — ABNORMAL LOW (ref 150–400)
RBC: 2.7 MIL/uL — ABNORMAL LOW (ref 4.22–5.81)
WBC: 3.7 10*3/uL — ABNORMAL LOW (ref 4.0–10.5)

## 2012-12-05 LAB — COMPREHENSIVE METABOLIC PANEL
ALT: 22 U/L (ref 0–53)
AST: 49 U/L — ABNORMAL HIGH (ref 0–37)
Albumin: 1.9 g/dL — ABNORMAL LOW (ref 3.5–5.2)
BUN: 14 mg/dL (ref 6–23)
Calcium: 7.8 mg/dL — ABNORMAL LOW (ref 8.4–10.5)
Chloride: 91 mEq/L — ABNORMAL LOW (ref 96–112)
Creatinine, Ser: 0.82 mg/dL (ref 0.50–1.35)
GFR calc Af Amer: >90 mL/min (ref >90)
Sodium: 126 mEq/L — ABNORMAL LOW (ref 135–145)
Total Protein: 6.1 g/dL (ref 6.0–8.3)

## 2012-12-05 LAB — AMMONIA
Ammonia: 164 umol/L — ABNORMAL HIGH (ref 11–60)
Ammonia: 93 umol/L — ABNORMAL HIGH (ref 11–60)

## 2012-12-05 LAB — GLUCOSE, CAPILLARY
Glucose-Capillary: 135 mg/dL — ABNORMAL HIGH (ref 70–99)
Glucose-Capillary: 157 mg/dL — ABNORMAL HIGH (ref 70–99)

## 2012-12-05 LAB — MAGNESIUM: Magnesium: 1.5 mg/dL (ref 1.5–2.5)

## 2012-12-05 MED ORDER — POTASSIUM CHLORIDE CRYS ER 20 MEQ PO TBCR
40.0000 meq | EXTENDED_RELEASE_TABLET | Freq: Two times a day (BID) | ORAL | Status: DC
  Administered 2012-12-05 – 2012-12-08 (×6): 40 meq via ORAL
  Filled 2012-12-05 (×7): qty 2

## 2012-12-05 MED ORDER — LACTULOSE 10 GM/15ML PO SOLN
30.0000 g | Freq: Every day | ORAL | Status: DC
  Administered 2012-12-05 – 2012-12-09 (×24): 30 g via ORAL
  Filled 2012-12-05 (×29): qty 45

## 2012-12-05 NOTE — Progress Notes (Signed)
[facility] PROGRESS NOTE  Tony Boyd ZOX:[id number] DOB: 01/17/1956 DOA: 12/03/2012 PCP: [name], DO  Assessment/Plan: 1. Dyspnea, rule out CHF in a patient with elevated pro-BNP, anasarca  -Elevated pro-BNP and diffuse anasarca noted, rule out CHF, obtain Echo.  -Abdominal Distention (ascites?); Pt presented to [place] dept today for possible paracentesis. On [place] abd in all four quadrants there is no significant ascites present. Procedure was cancelled. -Continue current doses of Lasix and Spironolactone. Check daily weights/strict I&O.    2. Hepatic encephalopathy  -Ammonia levels 12/05/2012 have climbed to 164; repeat ammonia level 12/06/1998 1493   -Increase doses of Lactulose to 6 times/day and continue Rifaximin( xifaxan ) current dose -Repeat ammonia level in A.m. -Patient currently A./O. x4 with negative asterixis  3. Esophageal varices -Review of EPIC shows patient visited Dr. Melvia Heaps (gastroenterologist) on   04/15/2012 patient was instructed to follow    a.  Recommended dose by Dr. Arlyce Dice nadolol 40 mg daily (unsure if patient's BP will tolerate full dose start at 20mg  daily); 12/05/2012 get patient's BP still stable in the a.m. will increase nadolol to 40 mg daily   b. Repeat endoscopy (timeframe not mentioned); contact Dr. Melvia Heaps  to discuss patient   c. Hepatitis A (negative), Hepatitis B surface AG (negative). Hepatitis C AB (positive ) -Obtain a hepatitis C viral load  4. Hyponatremia  -Likely secondary to cirrhosis physiology. Continue diuretic therapy.  -Current standing weight (used as baseline weight) = 118.9kg;  Admission weight 12/03/2012 = 108kg ( unsure how this was measured bed vs standing scan) Will therefore use his current weight as his baseline. -Net I/O since admission= 1.2 L - Stable  5 Cirrhosis  -Appears to be end stage. Has significant thrombocytopenia, elevated bilirubin and low albumin. H/O coagulopathy as well.   6  Thrombocytopenia, acquired  -Avoid LMWH. Use SCDs for DVT prophylaxis.   7. Hypothyroidism  -Continue Synthroid. TSH recently checked and was 3.470.   8 Diabetes mellitus type 2, uncontrolled, with complications  -Continue Lantus 20 units daily and 6 units of NovoLog Q AC. Add SSI. -CBGs controlled    9. Hypokalemia  -Secondary to diuretics. Increase supplementation dose.  -Inc PO Potassium to 40 meq BID; cont. Monitor QD  10.Hypomagnesemia -Resolved  11. Normocytic anemia  -Likely anemia of chronic disease.   12. Seizure disorder  -Continue Keppra   13. H/O bipolar disorder  -Continue Risperdal.    Code Status:  Family Communication:  Disposition Plan:    Consultants:   Procedures:  Echocardiogram 12/04/2012;  - Left ventricle: The cavity size was normal. Systolic function was normal.  -LVEF=  55% to 60%. - Mitral valve: Mildly calcified annulus. - Left atrium: The atrium was moderately dilated.     Antibiotics:  Rifaximin(hepatic encephalopathy)   HPI/Subjective: Tony Boyd is an 57 y.o.WM  PMHx Grade 3 varices/Portal HTN Gastropathy (Dx 05/06/2012 by EGD Dr Melvia Heaps), Cirrhosis/ESLD secondary to ETOH abuse and hepatitis C who was brought to the hospital with an acute confusional state. The patient tells me he has been short of breath since 6 a.m., and that he has had back and hip pain. The patient tells me he lives in an independent living facility, and that 2 daughters provide his care including providing him with meals. He tells me he takes 5 doses of Lactulose a day, and that he has been compliant with his medications. He is noted to be tremulous and grossly volume overloaded on initial exam. Pro-BNP is elevated  at 297.7. He has not had a 2-D Echocardiogram. He has not had any alcohol in over a year. He is otherwise alert and conversant. Upon initial evaluation in the ER, the patient was found to have some atelectasis and vascular congestion on a CXR,  and his ammonia levels were elevated, so he was referred for inpatient evaluation. He has had 8 admissions in the past 6 months. 12/04/2012 patient states he is feeling much better like to know how long he needs to remain in the hospital.   Patient feels he is back to baseline with his cognitive ability, and that his asterixis has TODAY ambulating around the room, A./O. x4, negative asterixis   Objective: Filed Vitals:   12/04/12 0614 12/04/12 1446 12/04/12 2138 12/05/12 0617  BP: 105/65 138/77 113/69 104/65  Pulse: 72 84 62 76  Temp: 98.4 F (36.9 C) 98.3 F (36.8 C) 98.4 F (36.9 C) 98.6 F (37 C)  TempSrc: Oral Oral Oral Oral  Resp: 18 20  18   Height:      Weight: 113.399 kg (250 lb)   118.9 kg (262 lb 2 oz)  SpO2: 100% 99% 97% 96%    Intake/Output Summary (Last 24 hours) at 12/05/12 0804 Last data filed at 12/04/12 1900  Gross per 24 hour  Intake    960 ml  Output      0 ml  Net    960 ml   Filed Weights   12/03/12 1850 12/04/12 0614 12/05/12 0617  Weight: 108 kg (238 lb 1.6 oz) 113.399 kg (250 lb) 118.9 kg (262 lb 2 oz)    Exam:   General:  A./O. x4,NAD  Cardiovascular: Regular rhythm and rate, negative murmurs rubs or gallops,  Respiratory: Clear to auscultation bilateral  Abdomen: Distended, nontender, plus bowel sounds  Musculoskeletal: Bilateral pedal edema 1+ to mid shin   Data Reviewed: Basic Metabolic Panel:  Recent Labs Lab 12/03/12 1435 12/04/12 0523 12/05/12 0550  NA 124* 125* 126*  K 3.0* 2.5* 3.4*  CL 90* 89* 91*  CO2 21 29 29   GLUCOSE 174* 127* 137*  BUN 14 14 14   CREATININE 0.77 0.78 0.82  CALCIUM 8.1* 7.8* 7.8*  MG  --  1.0* 1.5   Liver Function Tests:  Recent Labs Lab 12/03/12 1503 12/05/12 0550  AST 61* 49*  ALT 28 22  ALKPHOS 247* 205*  BILITOT 2.8* 2.4*  PROT 7.4 6.1  ALBUMIN 2.4* 1.9*   No results found for this basename: LIPASE, AMYLASE,  in the last 168 hours  Recent Labs Lab 12/03/12 1530 12/05/12 0550   AMMONIA 103* 164*   CBC:  Recent Labs Lab 12/03/12 1530 12/05/12 0550  WBC 4.4 3.7*  NEUTROABS  --  2.3  HGB 10.7* 9.3*  HCT 30.2* 26.4*  MCV 97.4 97.8  PLT 42* 42*   Cardiac Enzymes:  Recent Labs Lab 12/03/12 1530  TROPONINI <0.30   BNP (last 3 results)  Recent Labs  12/03/12 1435  PROBNP 297.7*   CBG:  Recent Labs Lab 12/03/12 2058 12/04/12 0747 12/04/12 1132 12/04/12 1614 12/04/12 2138  GLUCAP 149* 109* 188* 168* 132*    No results found for this or any previous visit (from the past 240 hour(s)).   Studies: Dg Chest 2 View  12/03/2012   CLINICAL DATA:  Hepatitis-C. Fatigue. Short of breath.  EXAM: CHEST  2 VIEW  COMPARISON:  11/12/2012  FINDINGS: Lungs are under aerated with bibasilar atelectasis. Mild cardiomegaly and vascular congestion without evidence of interstitial  edema. No pleural effusion. No pneumothorax.  IMPRESSION: Mild cardiomegaly and vascular congestion without edema.  Bibasilar atelectasis.   Electronically Signed   By: Maryclare Bean M.D.   On: 12/03/2012 15:20   US Abdomen Limited  12/04/2012   CLINICAL DATA:  Cirrhosis, assessment for ascites and drainage  EXAM: LIMITED ABDOMEN ULTRASOUND FOR ASCITES  TECHNIQUE: Limited ultrasound survey for ascites was performed in all four abdominal quadrants.  COMPARISON:  10/15/2012  FINDINGS: No significant ascites identified.  Paracentesis not performed.  IMPRESSION: No significant ascites identified.   Electronically Signed   By: Ulyses Southward M.D.   On: 12/04/2012 14:16    Scheduled Meds: . ferrous sulfate  325 mg Oral QHS  . folic acid  1 mg Oral Daily  . furosemide  40 mg Oral BID  . insulin aspart  0-20 Units Subcutaneous TID WC  . insulin aspart  0-5 Units Subcutaneous QHS  . insulin aspart  6 Units Subcutaneous TID WC  . insulin glargine  20 Units Subcutaneous QHS  . lactulose  30 g Oral 5 X Daily  . levETIRAcetam  500 mg Oral BID  . levothyroxine  75 mcg Oral QAC breakfast  . nadolol  20 mg  Oral Daily  . pantoprazole  40 mg Oral Daily  . potassium chloride  30 mEq Oral BID  . rifaximin  550 mg Oral BID  . risperiDONE  0.25 mg Oral BID  . sodium chloride  3 mL Intravenous Q12H  . spironolactone  100 mg Oral Daily   Continuous Infusions:   Principal Problem:   Dyspnea, rule out CHF in a patient with elevated pro-BNP, anasarca Active Problems:   Hepatic encephalopathy   Hyponatremia   Cirrhosis   Esophageal varices without mention of bleeding   Seizure disorder   Thrombocytopenia, acquired   Hypothyroidism   Diabetes mellitus type 2, uncontrolled, with complications   Anasarca   Hypokalemia   Normocytic anemia   H/O bipolar disorder   Hypomagnesemia    Time spent: 40 minutes    WOODS, CURTIS, J  [facility] Pager [phone]. If 7PM-7AM, please contact night-coverage at [email or web address], password [facility] 12/05/2012, 8:04 AM  LOS: 2 days

## 2012-12-05 NOTE — Progress Notes (Signed)
Spoke with Tony Tony Boyd at bedside to offer [facility] Care Management services again. He has refused in the recent past and consents were signed at this visit. He states he does not want these services to interfere with the services he already has, such as home health and eventually if he is able to go to [facility] program. Assured him that [facility] Care Management is an additional resource to help keep him out of the hospital. He reports he keeps coming back into the hospital because of "fluid".States his daughter Tony Boyd lives with him and her number is [phone]. Left packet at bedside. Patient will receive post hospital discharge call and will be evaluate for monthly home visits. Spoke with inpatient RNCM who states patient has [facility] home health for PT/RN services prior to admit. Tony Boyd appreciative of visit.  [name], MSN-Ed, RN,BSN- [facility] [phone]

## 2012-12-05 NOTE — Evaluation (Signed)
Physical Therapy Evaluation Patient Details Name: Tony Boyd MRN: [id number] DOB: 1955/08/29 Today's Date: 12/05/2012 Time: 2130-8657 PT Time Calculation (min): 23 min  PT Assessment / Plan / Recommendation History of Present Illness  57 yo male admitted with hepatic encephalopathy. Hx of Hep C, Sz, cirrhosis. From [facility]retirement living  Clinical Impression  Pt will benefit from PT to address deficits below;     PT Assessment  Patient needs continued PT services    Follow Up Recommendations  Home health PT;No PT follow up (vs)    Does the patient have the potential to tolerate intense rehabilitation      Barriers to Discharge        Equipment Recommendations  None recommended by PT    Recommendations for Other Services     Frequency Min 3X/week    Precautions / Restrictions Precautions Precautions: Fall Restrictions Weight Bearing Restrictions: No   Pertinent Vitals/Pain C/o back pain, RN notified      Mobility  Bed Mobility Bed Mobility: Supine to Sit Supine to Sit: 4: Min assist Details for Bed Mobility Assistance: increased time, cues for hand placement Transfers Transfers: Sit to Stand;Stand to Sit Sit to Stand: 4: Min guard;From bed;From toilet Stand to Sit: 4: Min guard;To toilet;To chair/3-in-1 Details for Transfer Assistance: cues for hand placement    Exercises     PT Diagnosis: Difficulty walking  PT Problem List: Decreased balance;Decreased mobility PT Treatment Interventions: DME instruction;Gait training;Functional mobility training;Therapeutic activities;Therapeutic exercise;Patient/family education     PT Goals(Current goals can be found in the care plan section) Acute Rehab PT Goals Patient Stated Goal: home PT Goal Formulation: With patient Time For Goal Achievement: 12/12/12 Potential to Achieve Goals: Good  Visit Information  Last PT Received On: 12/05/12 Assistance Needed: +1 History of Present Illness: 57 yo  male admitted with hepatic encephalopathy. Hx of Hep C, Sz, cirrhosis. From [facility] living       Prior Functioning  Home Living Family/patient expects to be discharged to:: Private residence Type of Home: Assisted living Home Access: Level entry;Elevator Home Equipment: Walker - 4 wheels;Tub bench;Shower seat;Grab bars - tub/shower;Bedside commode Additional Comments: dtr does meds everyday and food/grocery shopping; dtr stays in apt when pt takes showers (distant supervision) Prior Function Level of Independence: Independent with assistive device(s) Communication Communication: No difficulties    Cognition  Cognition Arousal/Alertness: Awake/alert Behavior During Therapy: WFL for tasks assessed/performed    Extremity/Trunk Assessment Upper Extremity Assessment Upper Extremity Assessment: Generalized weakness Lower Extremity Assessment Lower Extremity Assessment: Generalized weakness   Balance    End of Session PT - End of Session Activity Tolerance: Patient tolerated treatment well Patient left: in chair;with call bell/phone within reach  GP     [facility] 12/05/2012, 10:10 AM

## 2012-12-06 DIAGNOSIS — B1920 Unspecified viral hepatitis C without hepatic coma: Secondary | ICD-10-CM

## 2012-12-06 LAB — COMPREHENSIVE METABOLIC PANEL
ALT: 23 U/L (ref 0–53)
Alkaline Phosphatase: 211 U/L — ABNORMAL HIGH (ref 39–117)
BUN: 14 mg/dL (ref 6–23)
CO2: 27 mEq/L (ref 19–32)
Calcium: 8.1 mg/dL — ABNORMAL LOW (ref 8.4–10.5)
GFR calc Af Amer: >90 mL/min (ref >90)
GFR calc non Af Amer: >90 mL/min (ref >90)
Glucose, Bld: 138 mg/dL — ABNORMAL HIGH (ref 70–99)
Potassium: 3.8 mEq/L (ref 3.5–5.1)
Sodium: 119 mEq/L — CL (ref 135–145)
Total Bilirubin: 2.5 mg/dL — ABNORMAL HIGH (ref 0.3–1.2)

## 2012-12-06 LAB — SODIUM
Sodium: 121 mEq/L — ABNORMAL LOW (ref 135–145)
Sodium: 124 mEq/L — ABNORMAL LOW (ref 135–145)
Sodium: 121 mEq/L — ABNORMAL LOW (ref 135–145)

## 2012-12-06 LAB — GLUCOSE, CAPILLARY
Glucose-Capillary: 127 mg/dL — ABNORMAL HIGH (ref 70–99)
Glucose-Capillary: 146 mg/dL — ABNORMAL HIGH (ref 70–99)
Glucose-Capillary: 99 mg/dL (ref 70–99)

## 2012-12-06 LAB — CBC WITH DIFFERENTIAL/PLATELET
Basophils Absolute: 0 10*3/uL (ref 0.0–0.1)
Eosinophils Absolute: 0.3 10*3/uL (ref 0.0–0.7)
Eosinophils Relative: 7 % — ABNORMAL HIGH (ref 0–5)
Lymphocytes Relative: 21 % (ref 12–46)
MCH: 34.3 pg — ABNORMAL HIGH (ref 26.0–34.0)
MCV: 96.7 fL (ref 78.0–100.0)
Neutrophils Relative %: 62 % (ref 43–77)
Platelets: 41 10*3/uL — ABNORMAL LOW (ref 150–400)
RBC: 2.74 MIL/uL — ABNORMAL LOW (ref 4.22–5.81)
RDW: 15.8 % — ABNORMAL HIGH (ref 11.5–15.5)
WBC: 3.9 10*3/uL — ABNORMAL LOW (ref 4.0–10.5)

## 2012-12-06 LAB — MAGNESIUM: Magnesium: 1.5 mg/dL (ref 1.5–2.5)

## 2012-12-06 MED ORDER — SODIUM CHLORIDE 1 G PO TABS
1.0000 g | ORAL_TABLET | Freq: Three times a day (TID) | ORAL | Status: DC
  Administered 2012-12-06 – 2012-12-07 (×5): 1 g via ORAL
  Filled 2012-12-06 (×8): qty 1

## 2012-12-06 NOTE — Progress Notes (Signed)
CRITICAL VALUE ALERT  Critical value received:  NA 119  Date of notification:  12/06/2012  Time of notification:  0743  Critical value read back:yes  Nurse who received alert:  Georgia Lopes, RN, BSN  MD notified (1st page):  Joseph Art, C  Time of first page:  0745  MD notified (2nd page):  Time of second page:   Responding MD:  Valentina Lucks  Time MD responded:  [phone]

## 2012-12-06 NOTE — Progress Notes (Signed)
[facility] PROGRESS NOTE  Tony Boyd NWG:[id number] DOB: January 03, 1956 DOA: 12/03/2012 PCP: [name], DO  Assessment/Plan: 1. Dyspnea, rule out CHF in a patient with elevated pro-BNP, anasarca  -Elevated pro-BNP and diffuse anasarca noted, rule out CHF, obtain Echo.  -Abdominal Distention (ascites?); Pt presented to [place] dept today for possible paracentesis. On [place] abd in all four quadrants there is no significant ascites present. Procedure was cancelled. -Continue current doses of Lasix and Spironolactone. Check daily weights/strict I&O.     2. Hepatic encephalopathy  -Ammonia levels 12/05/2012 have climbed to 164; repeat ammonia level 12/06/1998= 93   -Increase doses of Lactulose to 6 times/day and continue Rifaximin( xifaxan ) current dose -Repeat ammonia level in A.m. -Patient currently A./O. x4 with negative asterixis  3. Esophageal varices -Review of EPIC shows patient visited Dr. Melvia Heaps (gastroenterologist) on   04/15/2012 patient was instructed to follow    a.  Recommended dose by Dr. Arlyce Dice nadolol 40 mg daily (unsure if patient's BP will tolerate full dose start at 20mg  daily); 12/05/2012 get patient's BP still stable in the a.m. will increase nadolol to 40 mg daily   b. Repeat endoscopy (timeframe not mentioned); contact Dr. Melvia Heaps  to discuss patient   c. Hepatitis A (negative), Hepatitis B surface AG (negative)/ hep B core antibody and surface antibody pending.. Hepatitis C AB (positive ) -Obtain a hepatitis C viral load (pending)  4. Hyponatremia  -Likely secondary to cirrhosis physiology ([facility]AD[facility]) Continue diuretic therapy.  -Current standing weight (used as baseline weight) = 126.5kg;  Admission weight 12/03/2012 = 108kg ( unsure how this was measured bed vs standing scan) Will therefore use his current weight as his baseline. -Net I/O since admission= 1.2 L - Start Patient on fluid restriction + 1 g sodium chloride tablet 3 times a day, if no improvement  by this afternoon will consider the use of 3% NaCl infusion -Start every 4 hour sodium check NOTE; 12/06/2012 at 1232 NA=121. Continue fluid restrictions and salt tablets q. a.c.  5 Cirrhosis  -Appears to be end stage. Has significant thrombocytopenia, elevated bilirubin and low albumin. H/O coagulopathy as well.   6 Thrombocytopenia, acquired  -Avoid LMWH. Use SCDs for DVT prophylaxis.  -Stable but extremely low, continue to monitor for bleeding  7. Hypothyroidism  -Continue Synthroid. TSH recently checked and was 3.470.   8 Diabetes mellitus type 2, uncontrolled, with complications  -Continue Lantus 20 units daily and 6 units of NovoLog Q AC. Add SSI. -CBGs controlled    9. Hypokalemia  -Secondary to diuretics. Increase supplementation dose.  -Inc PO Potassium to 40 meq BID; cont. Monitor QD  10.Hypomagnesemia -Resolved  11. Normocytic anemia  -Likely anemia of chronic disease.  -Stable  12. Seizure disorder  -Continue Keppra   13. H/O bipolar disorder  -Continue Risperdal.    Code Status:  Family Communication:  Disposition Plan:    Consultants:   Procedures:  Echocardiogram 12/04/2012;  - Left ventricle: The cavity size was normal. Systolic function was normal.  -LVEF=  55% to 60%. - Mitral valve: Mildly calcified annulus. - Left atrium: The atrium was moderately dilated.     Antibiotics:  Rifaximin(hepatic encephalopathy)   HPI/Subjective: Tony Boyd is an 57 y.o.WM  PMHx Grade 3 varices/Portal HTN Gastropathy (Dx 05/06/2012 by EGD Dr Melvia Heaps), Cirrhosis/ESLD secondary to ETOH abuse and hepatitis C who was brought to the hospital with an acute confusional state. The patient tells me he has been short of breath since 6 a.m.,  and that he has had back and hip pain. The patient tells me he lives in an independent living facility, and that 2 daughters provide his care including providing him with meals. He tells me he takes 5 doses of Lactulose a  day, and that he has been compliant with his medications. He is noted to be tremulous and grossly volume overloaded on initial exam. Pro-BNP is elevated at 297.7. He has not had a 2-D Echocardiogram. He has not had any alcohol in over a year. He is otherwise alert and conversant. Upon initial evaluation in the ER, the patient was found to have some atelectasis and vascular congestion on a CXR, and his ammonia levels were elevated, so he was referred for inpatient evaluation. He has had 8 admissions in the past 6 months. 12/04/2012 patient states he is feeling much better like to know how long he needs to remain in the hospital.   Patient feels he is back to baseline with his cognitive ability, and that his asterixis has 12/06/2012 ambulating around the room, A./O. x4, negative asterixis. TODAY A./O. x4 resting comfortably in bed watching television; states negative signs symptoms of seizure activity or cognitive changes.   Objective: Filed Vitals:   12/05/12 0617 12/05/12 1357 12/05/12 2206 12/06/12 0610  BP: 104/65 112/70 126/80 99/62  Pulse: 76 72 74 73  Temp: 98.6 F (37 C) 98 F (36.7 C) 97.8 F (36.6 C) 97.8 F (36.6 C)  TempSrc: Oral Oral Oral Oral  Resp: 18 20 20 18   Height:      Weight: 118.9 kg (262 lb 2 oz)   126.5 kg (278 lb 14.1 oz)  SpO2: 96% 99% 99% 99%    Intake/Output Summary (Last 24 hours) at 12/06/12 0750 Last data filed at 12/06/12 0400  Gross per 24 hour  Intake    596 ml  Output    200 ml  Net    396 ml   Filed Weights   12/04/12 0614 12/05/12 0617 12/06/12 0610  Weight: 113.399 kg (250 lb) 118.9 kg (262 lb 2 oz) 126.5 kg (278 lb 14.1 oz)    Exam:   General:  A./O. x4,NAD  Cardiovascular: Regular rhythm and rate, negative murmurs rubs or gallops,  Respiratory: Clear to auscultation bilateral  Abdomen: Distended, nontender, plus bowel sounds  Musculoskeletal: Bilateral pedal edema 1+ to mid shin   Data Reviewed: Basic Metabolic Panel:  Recent  Labs Lab 12/03/12 1435 12/04/12 0523 12/05/12 0550 12/06/12 0601  NA 124* 125* 126* 119*  K 3.0* 2.5* 3.4* 3.8  CL 90* 89* 91* 87*  CO2 21 29 29 27   GLUCOSE 174* 127* 137* 138*  BUN 14 14 14 14   CREATININE 0.77 0.78 0.82 0.82  CALCIUM 8.1* 7.8* 7.8* 8.1*  MG  --  1.0* 1.5 1.5   Liver Function Tests:  Recent Labs Lab 12/03/12 1503 12/05/12 0550 12/06/12 0601  AST 61* 49* 54*  ALT 28 22 23   ALKPHOS 247* 205* 211*  BILITOT 2.8* 2.4* 2.5*  PROT 7.4 6.1 6.2  ALBUMIN 2.4* 1.9* 1.9*   No results found for this basename: LIPASE, AMYLASE,  in the last 168 hours  Recent Labs Lab 12/03/12 1530 12/05/12 0550 12/05/12 1130  AMMONIA 103* 164* 93*   CBC:  Recent Labs Lab 12/03/12 1530 12/05/12 0550 12/06/12 0601  WBC 4.4 3.7* 3.9*  NEUTROABS  --  2.3 2.4  HGB 10.7* 9.3* 9.4*  HCT 30.2* 26.4* 26.5*  MCV 97.4 97.8 96.7  PLT 42* 42*  41*   Cardiac Enzymes:  Recent Labs Lab 12/03/12 1530  TROPONINI <0.30   BNP (last 3 results)  Recent Labs  12/03/12 1435  PROBNP 297.7*   CBG:  Recent Labs Lab 12/05/12 0724 12/05/12 1121 12/05/12 1626 12/05/12 2052 12/06/12 0714  GLUCAP 135* 131* 157* 86 134*    No results found for this or any previous visit (from the past 240 hour(s)).   Studies: US Abdomen Limited  12/04/2012   CLINICAL DATA:  Cirrhosis, assessment for ascites and drainage  EXAM: LIMITED ABDOMEN ULTRASOUND FOR ASCITES  TECHNIQUE: Limited ultrasound survey for ascites was performed in all four abdominal quadrants.  COMPARISON:  10/15/2012  FINDINGS: No significant ascites identified.  Paracentesis not performed.  IMPRESSION: No significant ascites identified.   Electronically Signed   By: Ulyses Southward M.D.   On: 12/04/2012 14:16    Scheduled Meds: . ferrous sulfate  325 mg Oral QHS  . folic acid  1 mg Oral Daily  . furosemide  40 mg Oral BID  . insulin aspart  0-20 Units Subcutaneous TID WC  . insulin aspart  0-5 Units Subcutaneous QHS  .  insulin aspart  6 Units Subcutaneous TID WC  . insulin glargine  20 Units Subcutaneous QHS  . lactulose  30 g Oral 6 X Daily  . levETIRAcetam  500 mg Oral BID  . levothyroxine  75 mcg Oral QAC breakfast  . nadolol  20 mg Oral Daily  . pantoprazole  40 mg Oral Daily  . potassium chloride  40 mEq Oral BID  . rifaximin  550 mg Oral BID  . risperiDONE  0.25 mg Oral BID  . sodium chloride  3 mL Intravenous Q12H  . spironolactone  100 mg Oral Daily   Continuous Infusions:   Principal Problem:   Dyspnea, rule out CHF in a patient with elevated pro-BNP, anasarca Active Problems:   Hepatic encephalopathy   Hyponatremia   Cirrhosis   Esophageal varices without mention of bleeding   Seizure disorder   Thrombocytopenia, acquired   Hypothyroidism   Diabetes mellitus type 2, uncontrolled, with complications   Anasarca   Hypokalemia   Normocytic anemia   H/O bipolar disorder   Hypomagnesemia    Time spent: 40 minutes    Qunicy Higinbotham, J  [facility] Pager [phone]. If 7PM-7AM, please contact night-coverage at [email or web address], password [facility] 12/06/2012, 7:50 AM  LOS: 3 days

## 2012-12-07 DIAGNOSIS — F4310 Post-traumatic stress disorder, unspecified: Secondary | ICD-10-CM | POA: Diagnosis present

## 2012-12-07 LAB — CBC WITH DIFFERENTIAL/PLATELET
Basophils Absolute: 0 10*3/uL (ref 0.0–0.1)
HCT: 27.9 % — ABNORMAL LOW (ref 39.0–52.0)
Hemoglobin: 10.1 g/dL — ABNORMAL LOW (ref 13.0–17.0)
Lymphocytes Relative: 21 % (ref 12–46)
MCH: 34.7 pg — ABNORMAL HIGH (ref 26.0–34.0)
Monocytes Absolute: 0.6 10*3/uL (ref 0.1–1.0)
Neutro Abs: 2.2 10*3/uL (ref 1.7–7.7)
RDW: 15.8 % — ABNORMAL HIGH (ref 11.5–15.5)
WBC: 3.9 10*3/uL — ABNORMAL LOW (ref 4.0–10.5)

## 2012-12-07 LAB — PROTEIN / CREATININE RATIO, URINE: Total Protein, Urine: <4 mg/dL

## 2012-12-07 LAB — NA AND K (SODIUM & POTASSIUM), RAND UR: Sodium, Ur: 24 mEq/L

## 2012-12-07 LAB — COMPREHENSIVE METABOLIC PANEL
Albumin: 2.2 g/dL — ABNORMAL LOW (ref 3.5–5.2)
Alkaline Phosphatase: 217 U/L — ABNORMAL HIGH (ref 39–117)
BUN: 16 mg/dL (ref 6–23)
CO2: 25 mEq/L (ref 19–32)
Creatinine, Ser: 0.72 mg/dL (ref 0.50–1.35)
GFR calc Af Amer: >90 mL/min (ref >90)
GFR calc non Af Amer: >90 mL/min (ref >90)
Glucose, Bld: 125 mg/dL — ABNORMAL HIGH (ref 70–99)
Potassium: 4.6 mEq/L (ref 3.5–5.1)
Total Bilirubin: 2.6 mg/dL — ABNORMAL HIGH (ref 0.3–1.2)
Total Protein: 6.5 g/dL (ref 6.0–8.3)

## 2012-12-07 LAB — GLUCOSE, CAPILLARY
Glucose-Capillary: 119 mg/dL — ABNORMAL HIGH (ref 70–99)
Glucose-Capillary: 125 mg/dL — ABNORMAL HIGH (ref 70–99)
Glucose-Capillary: 148 mg/dL — ABNORMAL HIGH (ref 70–99)

## 2012-12-07 LAB — SODIUM
Sodium: 120 mEq/L — ABNORMAL LOW (ref 135–145)
Sodium: 121 mEq/L — ABNORMAL LOW (ref 135–145)
Sodium: 120 mEq/L — ABNORMAL LOW (ref 135–145)
Sodium: 121 mEq/L — ABNORMAL LOW (ref 135–145)

## 2012-12-07 LAB — HEPATITIS B SURFACE ANTIBODY,QUALITATIVE: Hep B S Ab: NEGATIVE

## 2012-12-07 LAB — MAGNESIUM: Magnesium: 1.4 mg/dL — ABNORMAL LOW (ref 1.5–2.5)

## 2012-12-07 LAB — HEPATITIS PANEL, ACUTE
HCV Ab: REACTIVE — AB
Hep A IgM: NEGATIVE
Hep B C IgM: NEGATIVE
Hepatitis B Surface Ag: NEGATIVE

## 2012-12-07 LAB — AMMONIA: Ammonia: 128 umol/L — ABNORMAL HIGH (ref 11–60)

## 2012-12-07 MED ORDER — SODIUM CHLORIDE 1 G PO TABS
2.0000 g | ORAL_TABLET | Freq: Three times a day (TID) | ORAL | Status: DC
  Administered 2012-12-07 – 2012-12-09 (×5): 2 g via ORAL
  Filled 2012-12-07 (×8): qty 2

## 2012-12-07 MED ORDER — PAROXETINE HCL 20 MG PO TABS
20.0000 mg | ORAL_TABLET | Freq: Every day | ORAL | Status: DC
  Administered 2012-12-07 – 2012-12-11 (×5): 20 mg via ORAL
  Filled 2012-12-07 (×5): qty 1

## 2012-12-07 NOTE — Progress Notes (Signed)
[facility] PROGRESS NOTE  [name] ZOX:[id number] DOB: [birth year] DOA: 12/03/2012 PCP: [name], DO  Assessment/Plan: 1. Dyspnea, rule out CHF in a patient with elevated pro-BNP, anasarca  -Elevated pro-BNP and diffuse anasarca noted, rule out CHF, obtain Echo.  -Abdominal Distention (ascites?); Pt presented to [place] dept today for possible paracentesis. On [place] abd in all four quadrants there is no significant ascites present. Procedure was cancelled. -Check daily weights/strict I&O    2. Hepatic encephalopathy  -Ammonia levels 12/05/2012 have climbed to 164; repeat ammonia level 12/06/1998= 93   -Increased dose of Lactulose to 6 times/day and continue Rifaximin( xifaxan ) current dose -Repeat ammonia level in A.m. -Patient currently A./O. x4 with negative asterixis  3. Esophageal varices -Review of EPIC shows patient visited Dr. Melvia Heaps (gastroenterologist) on   04/15/2012 patient was instructed to follow    a.  Recommended dose by Dr. Arlyce Dice nadolol 40 mg daily (unsure if patient's BP will tolerate full dose start at 20mg  daily); 12/05/2012 get patient's BP still stable in the a.m. will increase nadolol to 40 mg daily   b. Repeat endoscopy (timeframe not mentioned); contact Dr. Melvia Heaps  to discuss patient   c. Hepatitis A (negative), Hepatitis B surface AG (negative)/ hep B core antibody and surface antibody pending.. Hepatitis C AB (positive ) -Obtain a hepatitis C viral load (pending)  4. Hyponatremia  -Likely secondary to cirrhosis physiology (SIADH) Continue diuretic therapy.  -Current standing weight (used as baseline weight) = 125.2 kg;  Admission weight 12/03/2012 = 108kg ( unsure how this was measured bed vs standing scan) Will therefore use his current weight as his baseline. -Net I/O since admission= 1.2 L - Continue every 4 hour sodium check NOTE; 12/07/2012 at 1208 NA=121. Continue fluid restrictions and Inc salt tablets to 2mg  QAC -Obtain Serum  Osm/Urine Osm, Urine Creatine and electrolytes  5 Cirrhosis  -Appears to be end stage. Has significant thrombocytopenia, elevated bilirubin and low albumin. H/O coagulopathy as well.   6 Thrombocytopenia, acquired  -Avoid LMWH. Use SCDs for DVT prophylaxis.  -Stable but extremely low, continue to monitor for bleeding  7. Hypothyroidism  -Continue Synthroid. TSH recently checked and was 3.470.   8 Diabetes mellitus type 2, uncontrolled, with complications  -Continue Lantus 20 units daily and 6 units of NovoLog Q AC. Add SSI. -CBGs controlled    9. Hypokalemia  -Secondary to diuretics. Increase supplementation dose.  -Inc PO Potassium to 40 meq BID; cont. Monitor QD  10.Hypomagnesemia -Resolved  11. Normocytic anemia  -Likely anemia of chronic disease.  -Stable  12. Seizure disorder  -Continue Keppra   13. H/O bipolar disorder  -Continue Risperdal.  14. PTSD -Start Paxil 20 mg QD    Code Status:  Family Communication:  Disposition Plan:    Consultants:   Procedures:  Echocardiogram 12/04/2012;  - Left ventricle: The cavity size was normal. Systolic function was normal.  -LVEF=  55% to 60%. - Mitral valve: Mildly calcified annulus. - Left atrium: The atrium was moderately dilated.     Antibiotics:  Rifaximin(hepatic encephalopathy)   HPI/Subjective: Tony Boyd is an 57 y.o.WM  PMHx Grade 3 varices/Portal HTN Gastropathy (Dx 05/06/2012 by EGD Dr Melvia Heaps), Cirrhosis/ESLD secondary to ETOH abuse and hepatitis C who was brought to the hospital with an acute confusional state. The patient tells me he has been short of breath since 6 a.m., and that he has had back and hip pain. The patient tells me he lives in an independent  living facility, and that 2 daughters provide his care including providing him with meals. He tells me he takes 5 doses of Lactulose a day, and that he has been compliant with his medications. He is noted to be tremulous and grossly  volume overloaded on initial exam. Pro-BNP is elevated at 297.7. He has not had a 2-D Echocardiogram. He has not had any alcohol in over a year. He is otherwise alert and conversant. Upon initial evaluation in the ER, the patient was found to have some atelectasis and vascular congestion on a CXR, and his ammonia levels were elevated, so he was referred for inpatient evaluation. He has had 8 admissions in the past 6 months. 12/04/2012 patient states he is feeling much better like to know how long he needs to remain in the hospital.   Patient feels he is back to baseline with his cognitive ability, and that his asterixis has 12/05/2012 ambulating around the room, A./O. x4, negative asterixis. 12/06/2012 A./O. x4 resting comfortably in bed watching television; states negative signs symptoms of seizure activity or cognitive changes. TODAY states she's feeling much better, has been ambulating around the room, negative SOB, CP, negative cognitive changes. Patient's only concern is his anxiety/PTSD. Requested to know should we increase his Xanax for use a better medication.      Objective: Filed Vitals:   12/06/12 1400 12/06/12 2150 12/07/12 0659 12/07/12 0726  BP: 117/68 116/73 105/64   Pulse: 80 70 78   Temp: 98 F (36.7 C) 97.9 F (36.6 C) 97.9 F (36.6 C)   TempSrc: Oral Oral Oral   Resp: 20 20 20    Height:      Weight:    125.2 kg (276 lb 0.3 oz)  SpO2: 100% 97% 95%     Intake/Output Summary (Last 24 hours) at 12/07/12 1421 Last data filed at 12/07/12 1400  Gross per 24 hour  Intake    720 ml  Output    300 ml  Net    420 ml   Filed Weights   12/05/12 0617 12/06/12 0610 12/07/12 0726  Weight: 118.9 kg (262 lb 2 oz) 126.5 kg (278 lb 14.1 oz) 125.2 kg (276 lb 0.3 oz)    Exam:   General:  A./O. x4,NAD  Cardiovascular: Regular rhythm and rate, negative murmurs rubs or gallops,  Respiratory: Clear to auscultation bilateral  Abdomen: Decreased distention, nontender, plus bowel  sounds  Musculoskeletal: Bilateral pedal edema 1+ to mid shin   Data Reviewed: Basic Metabolic Panel:  Recent Labs Lab 12/03/12 1435 12/04/12 0523 12/05/12 0550 12/06/12 0601  12/06/12 1634 12/06/12 2025 12/07/12 0020 12/07/12 0739 12/07/12 1208  NA 124* 125* 126* 119*  < > 124* 121* 120* 118* 121*  K 3.0* 2.5* 3.4* 3.8  --   --   --   --  4.6  --   CL 90* 89* 91* 87*  --   --   --   --  87*  --   CO2 21 29 29 27   --   --   --   --  25  --   GLUCOSE 174* 127* 137* 138*  --   --   --   --  125*  --   BUN 14 14 14 14   --   --   --   --  16  --   CREATININE 0.77 0.78 0.82 0.82  --   --   --   --  0.72  --   CALCIUM  8.1* 7.8* 7.8* 8.1*  --   --   --   --  8.4  --   MG  --  1.0* 1.5 1.5  --   --   --   --  1.4*  --   < > = values in this interval not displayed. Liver Function Tests:  Recent Labs Lab 12/03/12 1503 12/05/12 0550 12/06/12 0601 12/07/12 0739  AST 61* 49* 54* 62*  ALT 28 22 23 25   ALKPHOS 247* 205* 211* 217*  BILITOT 2.8* 2.4* 2.5* 2.6*  PROT 7.4 6.1 6.2 6.5  ALBUMIN 2.4* 1.9* 1.9* 2.2*   No results found for this basename: LIPASE, AMYLASE,  in the last 168 hours  Recent Labs Lab 12/03/12 1530 12/05/12 0550 12/05/12 1130  AMMONIA 103* 164* 93*   CBC:  Recent Labs Lab 12/03/12 1530 12/05/12 0550 12/06/12 0601 12/07/12 0739  WBC 4.4 3.7* 3.9* 3.9*  NEUTROABS  --  2.3 2.4 2.2  HGB 10.7* 9.3* 9.4* 10.1*  HCT 30.2* 26.4* 26.5* 27.9*  MCV 97.4 97.8 96.7 95.9  PLT 42* 42* 41* 43*   Cardiac Enzymes:  Recent Labs Lab 12/03/12 1530  TROPONINI <0.30   BNP (last 3 results)  Recent Labs  12/03/12 1435  PROBNP 297.7*   CBG:  Recent Labs Lab 12/06/12 1119 12/06/12 1648 12/06/12 2148 12/07/12 0746 12/07/12 1116  GLUCAP 127* 146* 99 126* 119*    No results found for this or any previous visit (from the past 240 hour(s)).   Studies: No results found.  Scheduled Meds: . ferrous sulfate  325 mg Oral QHS  . folic acid  1 mg Oral  Daily  . furosemide  40 mg Oral BID  . insulin aspart  0-20 Units Subcutaneous TID WC  . insulin aspart  0-5 Units Subcutaneous QHS  . insulin aspart  6 Units Subcutaneous TID WC  . insulin glargine  20 Units Subcutaneous QHS  . lactulose  30 g Oral 6 X Daily  . levETIRAcetam  500 mg Oral BID  . levothyroxine  75 mcg Oral QAC breakfast  . nadolol  20 mg Oral Daily  . pantoprazole  40 mg Oral Daily  . potassium chloride  40 mEq Oral BID  . rifaximin  550 mg Oral BID  . risperiDONE  0.25 mg Oral BID  . sodium chloride  3 mL Intravenous Q12H  . sodium chloride  2 g Oral TID WC  . spironolactone  100 mg Oral Daily   Continuous Infusions:   Principal Problem:   Dyspnea, rule out CHF in a patient with elevated pro-BNP, anasarca Active Problems:   Hepatic encephalopathy   Hyponatremia   Cirrhosis   Esophageal varices without mention of bleeding   Seizure disorder   Thrombocytopenia, acquired   Hypothyroidism   Diabetes mellitus type 2, uncontrolled, with complications   Anasarca   Hypokalemia   Normocytic anemia   H/O bipolar disorder   Hypomagnesemia   PTSD (post-traumatic stress disorder)    Time spent: 35 minutes    Naomi Castrogiovanni, J  [facility] Pager [phone]. If 7PM-7AM, please contact night-coverage at [email or web address], password [facility] 12/07/2012, 2:21 PM  LOS: 4 days

## 2012-12-08 DIAGNOSIS — E875 Hyperkalemia: Secondary | ICD-10-CM | POA: Diagnosis present

## 2012-12-08 DIAGNOSIS — E039 Hypothyroidism, unspecified: Secondary | ICD-10-CM

## 2012-12-08 LAB — CBC WITH DIFFERENTIAL/PLATELET
Basophils Absolute: 0 10*3/uL (ref 0.0–0.1)
Eosinophils Absolute: 0.2 10*3/uL (ref 0.0–0.7)
Eosinophils Relative: 5 % (ref 0–5)
HCT: 26.6 % — ABNORMAL LOW (ref 39.0–52.0)
Hemoglobin: 9.6 g/dL — ABNORMAL LOW (ref 13.0–17.0)
Lymphocytes Relative: 16 % (ref 12–46)
Lymphs Abs: 0.6 10*3/uL — ABNORMAL LOW (ref 0.7–4.0)
MCH: 34.7 pg — ABNORMAL HIGH (ref 26.0–34.0)
MCV: 96 fL (ref 78.0–100.0)
Monocytes Relative: 13 % — ABNORMAL HIGH (ref 3–12)
Platelets: 39 10*3/uL — ABNORMAL LOW (ref 150–400)
RBC: 2.77 MIL/uL — ABNORMAL LOW (ref 4.22–5.81)
WBC: 4.1 10*3/uL (ref 4.0–10.5)

## 2012-12-08 LAB — GLUCOSE, CAPILLARY

## 2012-12-08 LAB — COMPREHENSIVE METABOLIC PANEL
ALT: 28 U/L (ref 0–53)
AST: 69 U/L — ABNORMAL HIGH (ref 0–37)
Albumin: 2.2 g/dL — ABNORMAL LOW (ref 3.5–5.2)
CO2: 24 mEq/L (ref 19–32)
Calcium: 8.2 mg/dL — ABNORMAL LOW (ref 8.4–10.5)
Chloride: 95 mEq/L — ABNORMAL LOW (ref 96–112)
Creatinine, Ser: 0.64 mg/dL (ref 0.50–1.35)
GFR calc non Af Amer: >90 mL/min (ref >90)
Sodium: 122 mEq/L — ABNORMAL LOW (ref 135–145)
Total Protein: 6.6 g/dL (ref 6.0–8.3)

## 2012-12-08 LAB — SODIUM
Sodium: 121 mEq/L — ABNORMAL LOW (ref 135–145)
Sodium: 121 mEq/L — ABNORMAL LOW (ref 135–145)
Sodium: 122 mEq/L — ABNORMAL LOW (ref 135–145)
Sodium: 125 mEq/L — ABNORMAL LOW (ref 135–145)

## 2012-12-08 LAB — MAGNESIUM: Magnesium: 1.4 mg/dL — ABNORMAL LOW (ref 1.5–2.5)

## 2012-12-08 LAB — POTASSIUM: Potassium: 5.1 mEq/L (ref 3.5–5.1)

## 2012-12-08 MED ORDER — NADOLOL 40 MG PO TABS
40.0000 mg | ORAL_TABLET | Freq: Every day | ORAL | Status: DC
  Administered 2012-12-09 – 2012-12-11 (×3): 40 mg via ORAL
  Filled 2012-12-08 (×3): qty 1

## 2012-12-08 NOTE — Progress Notes (Signed)
CARE MANAGEMENT NOTE 12/08/2012  Patient:  Tony Boyd, Tony Boyd   Account Number:  [id number]  Date Initiated:  12/04/2012  Documentation initiated by:  [facility]  Subjective/Objective Assessment:   57 year old male with hx ESLD admitted with SOB.     Action/Plan:   Lives at home. Was active with [facility] for HHRN/PT prior to admission to hospital.   Anticipated DC Date:  12/11/2012   Anticipated DC Plan:  HOME W HOME HEALTH SERVICES    DC Planning Services  CM consult    Status of service:  In process, will continue to follow  Medicare Important Message given?  NA - LOS <3 / Initial given by admissions   Per UR Regulation:  Reviewed for med. necessity/level of care/duration of stay

## 2012-12-08 NOTE — Progress Notes (Signed)
PT Cancellation Note  ___Treatment cancelled today due to medical issues with patient which prohibited therapy  ___ Treatment cancelled today due to patient receiving procedure or test   ___ Treatment cancelled today due to patient's refusal to participate   _X_ Treatment cancelled today due to pt's request.  Stated he did not feel good today but agreed for me to come back tomorrow.    Felecia Shelling  PTA [facility]  Acute  Rehab Pager      [phone]

## 2012-12-08 NOTE — Clinical Documentation Improvement (Signed)
THIS DOCUMENT IS NOT A PERMANENT PART OF THE MEDICAL RECORD  Please update your documentation with the medical record to reflect your response to this query. If you need help knowing how to do this please call [phone].  12/08/12   Dear Dr. Lyda Jester / Associates,  In a better effort to capture your patient's severity of illness, reflect appropriate length of stay and utilization of resources, a review of the patient medical record has revealed: presents with Dyspnea -  r/o CHF, anasarca with End Stage Liver Disease.    Based on your clinical judgment, please clarify and document if you feel:   CHF - ruled in  CHF - ruled out   Patient is being treated only with PO Lasix, not IV Lasix to code for an acute condition.     Please document response in a progress note and discharge summary.  In responding to this query please exercise your independent judgment.  The fact that a query is asked, does not imply that any particular answer is desired or expected.    Reviewed: Dr. Joseph Art notes no CHF  Thank You,  Shellee Milo  Clinical Documentation Specialist: [phone] [facility] Information Management [place]

## 2012-12-08 NOTE — Clinical Documentation Improvement (Signed)
THIS DOCUMENT IS NOT A PERMANENT PART OF THE MEDICAL RECORD  Please update your documentation with the medical record to reflect your response to this query. If you need help knowing how to do this please call [phone].  12/08/12   Dear Dr. Maryella Shivers,  In a better effort to capture your patient's severity of illness, reflect appropriate length of stay and utilization of resources, a review of the patient medical record has revealed: DM2, uncontrolled with complications.    Based on your clinical judgment, please identify DM2 complications and document findings in progress note and discharge summary.   E.g. DM2 with _____  -  neuropathy?, retinopathy?, gastroparesis? Other -  which you need to document  Depending on your response, may enhance risk of mortality for this admission.  In responding to this query please exercise your independent judgment.  The fact that a query is asked, does not imply that any particular answer is desired or expected.   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  Thank You,  Shellee Milo  Clinical Documentation Specialist: [phone] Health Information Management [place]

## 2012-12-08 NOTE — Progress Notes (Signed)
[facility] PROGRESS NOTE  Tony Boyd ZOX:[id number] DOB: 14-Nov-1955 DOA: 12/03/2012 PCP: [name], DO  Assessment/Plan: 1. Hepatic encephalopathy  -Ammonia levels 12/05/2012 have climbed to 164; Ammonia level 12/08/2012= 128, up from his low during this hospitalization of 93   -Continue Lactulose to 6 times/day and continue Rifaximin( xifaxan ) current dose -Repeat ammonia level. -Patient currently A./O. x4 with negative asterixis  2. Dyspnea, rule out CHF in a patient with elevated pro-BNP, anasarca  -Elevated pro-BNP and diffuse anasarca noted NO CHF; see echocardiogram results below.  -Abdominal Distention, not ascites by ultrasound; Pt presented to [place] dept today for possible paracentesis. On [place] abd in all four quadrants there is no significant ascites present. Procedure was cancelled. -Check daily weights/strict I&O; Reinsert Foley   3. Esophageal varices -Review of EPIC shows patient visited Dr. Melvia Heaps (gastroenterologist) on   04/15/2012 patient was instructed to follow;   a.  Recommended dose by Dr. Arlyce Dice nadolol 40 mg daily; Inc to 40 mg           12/08/2012. Monitior BP closely   b. Repeat endoscopy (timeframe not mentioned); contacted [facility]      Gastroenterology spoke with NP Willette Cluster) Dr. Melvia Heaps       will see in Am 10/14;  We can also stop ammonia check   c. Hepatitis A (negative),       Hepatitis B surface AG (negative)/ hep B core antibody and surface antibody      (negative).       Hepatitis C AB (positive ) -Obtain a hepatitis C viral load (pending)  4. Hyponatremia  -Likely secondary to cirrhosis physiology (SIADH) Continue diuretic therapy.  -Current standing weight 10/13 (used as baseline weight standing) = 118.5kg;   -Admission weight 12/03/2012 = 108kg ( unsure how this was measured)  - Change to BID sodium check; 12/08/2012 at 1200 NA=122.  -Continue fluid restrictions.  -Obtain Serum Osm/Urine Osm, Urine Creatine and  electrolytes - Spoke with Dr. Ashley Royalty (nephrologist). Recommended stopping sodium tablets. Placing Foley for better measurement of I./O.  5. Hyperkalemia -DC'd potassium  6. Cirrhosis  -Appears to be end stage. Has significant thrombocytopenia, elevated bilirubin and low albumin. H/O coagulopathy as well.   7 Thrombocytopenia, acquired  -Avoid LMWH. Use SCDs for DVT prophylaxis.  -Stable but extremely low, continue to monitor for bleeding  8. Hypothyroidism  -Continue Synthroid. TSH recently checked and was 3.470.   9. Diabetes mellitus type 2, uncontrolled, with complications  -Continue Lantus 20 units daily and 6 units of NovoLog Q AC. Add SSI. -CBGs controlled    10. Hypokalemia  -Secondary to diuretics. Increase supplementation dose.  -Inc PO Potassium to 40 meq BID; cont. Monitor QD  11.Hypomagnesemia -Resolved  12. Normocytic anemia  -Likely anemia of chronic disease.  -Stable  13. Seizure disorder  -Continue Keppra   14. H/O bipolar disorder  -Continue Risperdal.  15. PTSD -Start Paxil 20 mg QD    Code Status:  Family Communication:  Disposition Plan:    Consultants:   Procedures:  Echocardiogram 12/04/2012;  - Left ventricle: The cavity size was normal. Systolic function was normal.  -LVEF=  55% to 60%. - Mitral valve: Mildly calcified annulus. - Left atrium: The atrium was moderately dilated.     Antibiotics:  Rifaximin(hepatic encephalopathy)   HPI/Subjective: Tony Boyd is an 57 y.o.WM  PMHx Grade 3 varices/Portal HTN Gastropathy (Dx 05/06/2012 by EGD Dr Melvia Heaps), Cirrhosis/ESLD secondary to ETOH abuse and hepatitis C who  was brought to the hospital with an acute confusional state. The patient tells me he has been short of breath since 6 a.m., and that he has had back and hip pain. The patient tells me he lives in an independent living facility, and that 2 daughters provide his care including providing him with meals. He tells me  he takes 5 doses of Lactulose a day, and that he has been compliant with his medications. He is noted to be tremulous and grossly volume overloaded on initial exam. Pro-BNP is elevated at 297.7. He has not had a 2-D Echocardiogram. He has not had any alcohol in over a year. He is otherwise alert and conversant. Upon initial evaluation in the ER, the patient was found to have some atelectasis and vascular congestion on a CXR, and his ammonia levels were elevated, so he was referred for inpatient evaluation. He has had 8 admissions in the past 6 months. 12/04/2012 patient states he is feeling much better like to know how long he needs to remain in the hospital.   Patient feels he is back to baseline with his cognitive ability, and that his asterixis has 12/05/2012 ambulating around the room, A./O. x4, negative asterixis. 12/06/2012 A./O. x4 resting comfortably in bed watching television; states negative signs symptoms of seizure activity or cognitive changes. 12/07/2012 states she's feeling much better, has been ambulating around the room, negative SOB, CP, negative cognitive changes. Patient's only concern is his anxiety/PTSD. Requested to know should we increase his Xanax for use a better medication. TODAY states feels that his abdomen has decreased in girth, is softer however has some tenderness today, and positive nausea.     Objective: Filed Vitals:   12/07/12 1455 12/07/12 2155 12/08/12 0535 12/08/12 1430  BP: 126/68 126/77 126/77 120/71  Pulse: 70 78 84 74  Temp: 98 F (36.7 C) 97.8 F (36.6 C) 97.9 F (36.6 C) 98.4 F (36.9 C)  TempSrc: Oral Oral Oral Oral  Resp: 18 18 18 18   Height:      Weight:   128.6 kg (283 lb 8.2 oz)   SpO2: 97% 96% 95% 98%    Intake/Output Summary (Last 24 hours) at 12/08/12 1506 Last data filed at 12/08/12 0900  Gross per 24 hour  Intake    360 ml  Output    200 ml  Net    160 ml   Filed Weights   12/06/12 0610 12/07/12 0726 12/08/12 0535  Weight: 126.5 kg  (278 lb 14.1 oz) 125.2 kg (276 lb 0.3 oz) 128.6 kg (283 lb 8.2 oz)    Exam:   General:  A./O. x4,NAD  Cardiovascular: Regular rhythm and rate, negative murmurs rubs or gallops,  Respiratory: Clear to auscultation bilateral  Abdomen: Decreased distention, mild diffuse tenderness, plus bowel sounds  Musculoskeletal: Bilateral pedal edema 2+ to mid shin   Data Reviewed: Basic Metabolic Panel:  Recent Labs Lab 12/04/12 0523 12/05/12 0550 12/06/12 0601  12/07/12 0739  12/07/12 1943 12/08/12 0022 12/08/12 0542 12/08/12 0730 12/08/12 0752 12/08/12 1200  NA 125* 126* 119*  < > 118*  < > 121* 121* 122* 120*  --  122*  K 2.5* 3.4* 3.8  --  4.6  --   --   --  5.2*  --  5.1  --   CL 89* 91* 87*  --  87*  --   --   --  95*  --   --   --   CO2 29 29 27   --  25  --   --   --  24  --   --   --   GLUCOSE 127* 137* 138*  --  125*  --   --   --  143*  --   --   --   BUN 14 14 14   --  16  --   --   --  15  --   --   --   CREATININE 0.78 0.82 0.82  --  0.72  --   --   --  0.64  --   --   --   CALCIUM 7.8* 7.8* 8.1*  --  8.4  --   --   --  8.2*  --   --   --   MG 1.0* 1.5 1.5  --  1.4*  --   --   --  1.4*  --   --   --   < > = values in this interval not displayed. Liver Function Tests:  Recent Labs Lab 12/03/12 1503 12/05/12 0550 12/06/12 0601 12/07/12 0739 12/08/12 0542  AST 61* 49* 54* 62* 69*  ALT 28 22 23 25 28   ALKPHOS 247* 205* 211* 217* 205*  BILITOT 2.8* 2.4* 2.5* 2.6* 2.7*  PROT 7.4 6.1 6.2 6.5 6.6  ALBUMIN 2.4* 1.9* 1.9* 2.2* 2.2*   No results found for this basename: LIPASE, AMYLASE,  in the last 168 hours  Recent Labs Lab 12/03/12 1530 12/05/12 0550 12/05/12 1130 12/07/12 1604  AMMONIA 103* 164* 93* 128*   CBC:  Recent Labs Lab 12/03/12 1530 12/05/12 0550 12/06/12 0601 12/07/12 0739 12/08/12 0542  WBC 4.4 3.7* 3.9* 3.9* 4.1  NEUTROABS  --  2.3 2.4 2.2 2.6  HGB 10.7* 9.3* 9.4* 10.1* 9.6*  HCT 30.2* 26.4* 26.5* 27.9* 26.6*  MCV 97.4 97.8 96.7 95.9  96.0  PLT 42* 42* 41* 43* 39*   Cardiac Enzymes:  Recent Labs Lab 12/03/12 1530  TROPONINI <0.30   BNP (last 3 results)  Recent Labs  12/03/12 1435  PROBNP 297.7*   CBG:  Recent Labs Lab 12/07/12 0746 12/07/12 1116 12/07/12 1637 12/07/12 2152 12/08/12 1140  GLUCAP 126* 119* 125* 148* 157*    No results found for this or any previous visit (from the past 240 hour(s)).   Studies: No results found.  Scheduled Meds: . ferrous sulfate  325 mg Oral QHS  . folic acid  1 mg Oral Daily  . furosemide  40 mg Oral BID  . insulin aspart  0-20 Units Subcutaneous TID WC  . insulin aspart  0-5 Units Subcutaneous QHS  . insulin aspart  6 Units Subcutaneous TID WC  . insulin glargine  20 Units Subcutaneous QHS  . lactulose  30 g Oral 6 X Daily  . levETIRAcetam  500 mg Oral BID  . levothyroxine  75 mcg Oral QAC breakfast  . nadolol  20 mg Oral Daily  . pantoprazole  40 mg Oral Daily  . PARoxetine  20 mg Oral Daily  . potassium chloride  40 mEq Oral BID  . rifaximin  550 mg Oral BID  . risperiDONE  0.25 mg Oral BID  . sodium chloride  3 mL Intravenous Q12H  . sodium chloride  2 g Oral TID WC  . spironolactone  100 mg Oral Daily   Continuous Infusions:   Principal Problem:   Dyspnea, rule out CHF in a patient with elevated pro-BNP, anasarca Active Problems:   Hepatic encephalopathy   Hyponatremia  Cirrhosis   Esophageal varices without mention of bleeding   Seizure disorder   Thrombocytopenia, acquired   Hypothyroidism   Diabetes mellitus type 2, uncontrolled, with complications   Anasarca   Hypokalemia   Normocytic anemia   H/O bipolar disorder   Hypomagnesemia   PTSD (post-traumatic stress disorder)    Time spent: 35 minutes    Tony Boyd, J  [facility] Pager [phone]. If 7PM-7AM, please contact night-coverage at [email or web address], password [facility] 12/08/2012, 3:06 PM  LOS: 5 days

## 2012-12-09 ENCOUNTER — Inpatient Hospital Stay (HOSPITAL_COMMUNITY): Payer: Medicare Other

## 2012-12-09 LAB — CBC WITH DIFFERENTIAL/PLATELET
Basophils Relative: 0 % (ref 0–1)
Eosinophils Absolute: 0.2 10*3/uL (ref 0.0–0.7)
HCT: 24.7 % — ABNORMAL LOW (ref 39.0–52.0)
Hemoglobin: 8.8 g/dL — ABNORMAL LOW (ref 13.0–17.0)
Lymphs Abs: 0.7 10*3/uL (ref 0.7–4.0)
MCH: 35.1 pg — ABNORMAL HIGH (ref 26.0–34.0)
MCHC: 35.6 g/dL (ref 30.0–36.0)
Monocytes Absolute: 0.4 10*3/uL (ref 0.1–1.0)
Neutro Abs: 2.2 10*3/uL (ref 1.7–7.7)

## 2012-12-09 LAB — COMPREHENSIVE METABOLIC PANEL
ALT: 25 U/L (ref 0–53)
Albumin: 2 g/dL — ABNORMAL LOW (ref 3.5–5.2)
Alkaline Phosphatase: 178 U/L — ABNORMAL HIGH (ref 39–117)
Calcium: 8.2 mg/dL — ABNORMAL LOW (ref 8.4–10.5)
Chloride: 96 mEq/L (ref 96–112)
GFR calc Af Amer: >90 mL/min (ref >90)
Glucose, Bld: 129 mg/dL — ABNORMAL HIGH (ref 70–99)
Potassium: 4.2 mEq/L (ref 3.5–5.1)
Sodium: 125 mEq/L — ABNORMAL LOW (ref 135–145)
Total Bilirubin: 2.6 mg/dL — ABNORMAL HIGH (ref 0.3–1.2)
Total Protein: 6.1 g/dL (ref 6.0–8.3)

## 2012-12-09 LAB — GLUCOSE, CAPILLARY
Glucose-Capillary: 172 mg/dL — ABNORMAL HIGH (ref 70–99)
Glucose-Capillary: 126 mg/dL — ABNORMAL HIGH (ref 70–99)

## 2012-12-09 LAB — HEPATITIS B SURFACE ANTIBODY,QUALITATIVE: Hep B S Ab: NEGATIVE

## 2012-12-09 MED ORDER — LIDOCAINE HCL 1 % IJ SOLN
INTRAMUSCULAR | Status: AC
  Filled 2012-12-09: qty 20

## 2012-12-09 MED ORDER — SODIUM CHLORIDE 0.9 % IJ SOLN: 10.0000 mL | Freq: Two times a day (BID) | INTRAMUSCULAR | Status: DC

## 2012-12-09 MED ORDER — SODIUM CHLORIDE 0.9 % IJ SOLN
10.0000 mL | INTRAMUSCULAR | Status: DC | PRN
  Administered 2012-12-09 – 2012-12-11 (×5): 10 mL

## 2012-12-09 MED ORDER — SODIUM CHLORIDE 0.9 % IV SOLN: 250.0000 mL | INTRAVENOUS | Status: DC

## 2012-12-09 MED ORDER — LACTULOSE 10 GM/15ML PO SOLN
30.0000 g | Freq: Every day | ORAL | Status: DC
  Administered 2012-12-09 – 2012-12-11 (×8): 30 g via ORAL
  Filled 2012-12-09 (×12): qty 45

## 2012-12-09 MED ORDER — MAGNESIUM SULFATE 40 MG/ML IJ SOLN
2.0000 g | Freq: Once | INTRAMUSCULAR | Status: AC
  Administered 2012-12-09: 2 g via INTRAVENOUS
  Filled 2012-12-09: qty 50

## 2012-12-09 NOTE — Consult Note (Signed)
Consultation  Referring Provider:  [facility]    Primary Care Physician:  [name], DO Primary Gastroenterologist:  Barnet Pall, MD    Reason for Consultation:  Cirrhosis            HPI:   Tony Boyd is a 57 y.o. male known to Dr. Arlyce Dice for history of HCV / ETOH cirrhosis complicated by portal hypertension. EGD for varices screening in May 2013 revealed grade 3 esophageal varices and portal gastropathy. Patient started on nadolol.  Repeat EGD in March 2014 again showed grade 3 varices and portal gastropathy. Patient has been admitted repeatedly for hepatic encephalopathy.He was readmitted 6 days ago with confusion / ammonia level >100. No significant ascites on ultrasound this admission. He has been treated with lactulose and xifaxan. Though these meds are listed on home med list patient's daughter prepares meds and patient not sure what he takes.   Past Medical History  Diagnosis Date  . Cirrhosis of liver   . Hepatitis C   . ETOH abuse   . Hypothyroid   . Psychosis   . Bipolar disorder   . Seizures   . Arthritis   . Coagulopathy     Hx of  . Thrombocytopenia     Hx of  . Pancytopenia     Hx of  . Hyponatremia     Hx of  . Korsakoff psychosis   . H/O abdominal abscess   . Metabolic encephalopathy   . H/O renal failure   . Anemia   . Peripheral vascular disease   . GERD (gastroesophageal reflux disease)   . Unspecified hypothyroidism 06/02/2012  . Obesity, unspecified   . Pneumonitis due to inhalation of food or vomitus   . Cellulitis and abscess of upper arm and forearm   . Hypopotassemia   . Anemia, unspecified   . Other and unspecified coagulation defects   . Thrombocytopenia, unspecified   . Bipolar I disorder, most recent episode (or current) unspecified   . Abscess of liver(572.0)   . Chronic kidney disease, unspecified   . Lumbago   . Personal history of tobacco use, presenting hazards to health   . Pneumonitis due to inhalation of food or  vomitus 09/19/2010  . Personal history of tobacco use, presenting hazards to health   . Type 2 diabetes mellitus with diabetic neuropathy     Past Surgical History  Procedure Laterality Date  . Colostomy    . Cholecystectomy    . Small intestine surgery    . Arm surgery      left arm  . US guided drain placement in ventral hernia abscess  07/21/2010  . Multiple picc line placements    . Esophagogastroduodenoscopy  06/28/2011    Procedure: ESOPHAGOGASTRODUODENOSCOPY (EGD);  Surgeon: Louis Meckel, MD;  Location: [facility];  Service: Endoscopy;  Laterality: N/A;  . Esophagogastroduodenoscopy N/A 05/06/2012    Procedure: ESOPHAGOGASTRODUODENOSCOPY (EGD);  Surgeon: Louis Meckel, MD;  Location: [facility];  Service: Endoscopy;  Laterality: N/A;  . Esophageal banding N/A 05/06/2012    Procedure: ESOPHAGEAL BANDING;  Surgeon: Louis Meckel, MD;  Location: [facility];  Service: Endoscopy;  Laterality: N/A;    Family History  Problem Relation Age of Onset  . Heart disease Mother     MI  . Lung cancer Brother      History  Substance Use Topics  . Smoking status: Former Smoker    Types: Cigarettes    Quit date: 02/15/2008  .  Smokeless tobacco: Never Used  . Alcohol Use: No     Comment: Quit drinking 1 year ago.      Prior to Admission medications   Medication Sig Start Date End Date Taking? Authorizing Provider  ALPRAZolam Prudy Feeler) 1 MG tablet Take 1 tablet (1 mg total) by mouth at bedtime as needed for sleep. 10/31/12  Yes Tiffany L Reed, DO  ferrous sulfate 325 (65 FE) MG tablet Take 325 mg by mouth at bedtime.   Yes Historical Provider, MD  folic acid (FOLVITE) 1 MG tablet Take 1 mg by mouth daily.   Yes Historical Provider, MD  furosemide (LASIX) 40 MG tablet Take 40 mg by mouth 2 (two) times daily.   Yes Historical Provider, MD  insulin aspart (NOVOLOG) 100 UNIT/ML injection Inject 6 Units into the skin 3 (three) times daily with meals.   Yes Historical Provider, MD    insulin glargine (LANTUS) 100 UNIT/ML injection Inject 20 Units into the skin at bedtime.   Yes Historical Provider, MD  lactulose (CHRONULAC) 10 GM/15ML solution Take 45 mLs (30 g total) by mouth 5 (five) times daily. 11/14/12  Yes Catarina Hartshorn, MD  levETIRAcetam (KEPPRA) 500 MG tablet Take 500 mg by mouth 2 (two) times daily.   Yes Historical Provider, MD  levothyroxine (SYNTHROID, LEVOTHROID) 75 MCG tablet Take 75 mcg by mouth daily.   Yes Historical Provider, MD  omeprazole (PRILOSEC) 20 MG capsule Take 20 mg by mouth daily.   Yes Historical Provider, MD  Oxycodone HCl 10 MG TABS Take 1 tablet (10 mg total) by mouth every 4 (four) hours. 11/28/12  Yes Tiffany L Reed, DO  potassium chloride (K-DUR,KLOR-CON) 10 MEQ tablet Take 10 mEq by mouth daily.   Yes Historical Provider, MD  rifaximin (XIFAXAN) 550 MG TABS Take 550 mg by mouth 2 (two) times daily. 01/26/12  Yes Alison Murray, MD  risperiDONE (RISPERDAL) 0.25 MG tablet Take 0.25 mg by mouth 2 (two) times daily.   Yes Historical Provider, MD  spironolactone (ALDACTONE) 100 MG tablet Take 100 mg by mouth daily.   Yes Historical Provider, MD    Current Facility-Administered Medications  Medication Dose Route Frequency Provider Last Rate Last Dose  . 0.9 %  sodium chloride infusion  250 mL Intravenous PRN Maryruth Bun Rama, MD 10 mL/hr at 12/04/12 2044 250 mL at 12/04/12 2044  . albuterol (PROVENTIL) (5 MG/ML) 0.5% nebulizer solution 2.5 mg  2.5 mg Nebulization Q4H PRN Pamella Pert, MD       And  . ipratropium (ATROVENT) nebulizer solution 0.5 mg  0.5 mg Nebulization Q4H PRN Pamella Pert, MD      . ALPRAZolam Prudy Feeler) tablet 1 mg  1 mg Oral QHS PRN Maryruth Bun Rama, MD   1 mg at 12/06/12 2318  . alum & mag hydroxide-simeth (MAALOX/MYLANTA) 200-200-20 MG/5ML suspension 30 mL  30 mL Oral Q6H PRN Christina P Rama, MD      . ferrous sulfate tablet 325 mg  325 mg Oral QHS Maryruth Bun Rama, MD   325 mg at 12/08/12 1811  . folic acid (FOLVITE) tablet  1 mg  1 mg Oral Daily Maryruth Bun Rama, MD   1 mg at 12/08/12 1049  . furosemide (LASIX) tablet 40 mg  40 mg Oral BID Maryruth Bun Rama, MD   40 mg at 12/08/12 1811  . insulin aspart (novoLOG) injection 0-20 Units  0-20 Units Subcutaneous TID WC Maryruth Bun Rama, MD   4 Units at 12/08/12 1233  .  insulin aspart (novoLOG) injection 0-5 Units  0-5 Units Subcutaneous QHS Christina P Rama, MD      . insulin aspart (novoLOG) injection 6 Units  6 Units Subcutaneous TID WC Maryruth Bun Rama, MD   6 Units at 12/08/12 1233  . insulin glargine (LANTUS) injection 20 Units  20 Units Subcutaneous QHS Maryruth Bun Rama, MD   20 Units at 12/08/12 2201  . lactulose (CHRONULAC) 10 GM/15ML solution 30 g  30 g Oral 6 X Daily Drema Dallas, MD   30 g at 12/09/12 1610  . levETIRAcetam (KEPPRA) tablet 500 mg  500 mg Oral BID Maryruth Bun Rama, MD   500 mg at 12/08/12 2200  . levothyroxine (SYNTHROID, LEVOTHROID) tablet 75 mcg  75 mcg Oral QAC breakfast Maryruth Bun Rama, MD   75 mcg at 12/08/12 0829  . nadolol (CORGARD) tablet 40 mg  40 mg Oral Daily Drema Dallas, MD      . ondansetron [facility]) tablet 4 mg  4 mg Oral Q6H PRN Maryruth Bun Rama, MD       Or  . ondansetron (ZOFRAN) injection 4 mg  4 mg Intravenous Q6H PRN Christina P Rama, MD      . oxyCODONE (Oxy IR/ROXICODONE) immediate release tablet 10 mg  10 mg Oral Q4H PRN Maryruth Bun Rama, MD   10 mg at 12/09/12 0241  . pantoprazole (PROTONIX) EC tablet 40 mg  40 mg Oral Daily Maryruth Bun Rama, MD   40 mg at 12/08/12 1049  . PARoxetine (PAXIL) tablet 20 mg  20 mg Oral Daily Drema Dallas, MD   20 mg at 12/08/12 1049  . rifaximin (XIFAXAN) tablet 550 mg  550 mg Oral BID Maryruth Bun Rama, MD   550 mg at 12/08/12 2200  . risperiDONE (RISPERDAL) tablet 0.25 mg  0.25 mg Oral BID Maryruth Bun Rama, MD   0.25 mg at 12/08/12 2200  . sodium chloride 0.9 % injection 3 mL  3 mL Intravenous Q12H Maryruth Bun Rama, MD   3 mL at 12/08/12 2200  . sodium chloride 0.9 % injection 3 mL   3 mL Intravenous PRN Christina P Rama, MD      . sodium chloride tablet 2 g  2 g Oral TID WC Drema Dallas, MD   2 g at 12/08/12 1810  . spironolactone (ALDACTONE) tablet 100 mg  100 mg Oral Daily Maryruth Bun Rama, MD   100 mg at 12/08/12 1049    Allergies as of 12/03/2012 - Review Complete 12/03/2012  Allergen Reaction Noted  . Ativan [lorazepam] Other (See Comments) 08/08/2012  . Droperidol Other (See Comments) 09/14/2010  . Ketorolac Other (See Comments) 09/14/2010  . Penicillins Swelling and Other (See Comments) 09/14/2010  . Toradol [ketorolac tromethamine] Hives 06/06/2012    Review of Systems:    All systems reviewed and negative except where noted in HPI.   Physical Exam:  Vital signs in last 24 hours: Temp:  [98.3 F (36.8 C)-98.5 F (36.9 C)] 98.3 F (36.8 C) (10/14 0524) Pulse Rate:  [74-81] 81 (10/14 0524) Resp:  [16-18] 18 (10/14 0524) BP: (118-124)/(66-71) 124/68 mmHg (10/14 0524) SpO2:  [92 %-98 %] 92 % (10/14 0524) Weight:  [256 lb 6.3 oz (116.3 kg)-261 lb 3.9 oz (118.5 kg)] 256 lb 6.3 oz (116.3 kg) (10/14 0524) Last BM Date: 12/07/12 General:   Pleasant white male in NAD Head:  Normocephalic and atraumatic. Eyes:   No icterus.   Conjunctiva pink. Ears:  Normal auditory  acuity. Neck:  Supple; no masses felt Lungs:  Respirations even and unlabored. Lungs clear to auscultation bilaterally.   No wheezes, crackles, or rhonchi.  Heart:  Regular rate and rhythm Abdomen:  Soft, obese, nontender. Normal bowel sounds. Difficult to assess for masses secondary to habitus. He has abdominal wall edema.Msk:  Symmetrical without gross deformities.  Extremities:  Massive BLE edema with stasis changes Neurologic:  Alert and  oriented x4; mild asterixis Skin:  Intact without significant lesions or rashes. Psych:  Alert and cooperative. Normal affect.  LAB RESULTS:  Recent Labs  12/07/12 0739 12/08/12 0542  WBC 3.9* 4.1  HGB 10.1* 9.6*  HCT 27.9* 26.6*  PLT 43* 39*    BMET  Recent Labs  12/07/12 0739  12/08/12 0542  12/08/12 0752 12/08/12 1200 12/08/12 1602 12/08/12 2319  NA 118*  < > 122*  < >  --  122* 121* 125*  K 4.6  --  5.2*  --  5.1  --   --   --   CL 87*  --  95*  --   --   --   --   --   CO2 25  --  24  --   --   --   --   --   GLUCOSE 125*  --  143*  --   --   --   --   --   BUN 16  --  15  --   --   --   --   --   CREATININE 0.72  --  0.64  --   --   --   --   --   CALCIUM 8.4  --  8.2*  --   --   --   --   --   < > = values in this interval not displayed. LFT  Recent Labs  12/08/12 0542  PROT 6.6  ALBUMIN 2.2*  AST 69*  ALT 28  ALKPHOS 205*  BILITOT 2.7*   PT/INR Last INR was 10/15/12 - 1.83  PREVIOUS ENDOSCOPIES:            EGD for varices screening in May 2013 and again in March 2014. Findings: Non-bleeding grade 3 varices.   Impression / Plan:   1. Decompensated cirrhosis with hepatic encephalopathy. Patient did not have banding of varices on EGD in March 2013. Varices were non-bleeding and stable in size on Nadolol. He is not due for surveillance EGD until March 2015. This is my first visit with him today but he is lucid though with mild asterixis on exam.   Would continue Xifaxan and lactulose upon discharge.  Diet should be changed to 2gm sodium. Patient needs to adhere to 2 gm sodium diet at home and avoid salt shaker as well as pickles.    Continue nadolol  Continue Lasix and Aldactone. There is plenty of room to titrate up (electrolytes / renal function allowing) but I think that better compliance with low sodium diet at home should be tried first.    His sodium if now at 125 which is typical (though poor prognostic indicator) in cirrhosis. I think the sodium pills will exacerbate the fluid overload. Total body sodium is most likely not low but rather patient is just volume overloaded. Will stop sodium pills.   Will make patient a follow up with Dr. Arlyce Dice.  2. AFP elevation of 130 on 10/06/12. Last U/S  done 10/12/12 didn't show any suspicious liver lesions but this will  need close monitoring.   Thanks   LOS: 6 days   Willette Cluster  12/09/2012, 8:10 AM  Attending MD note:   I have reviewed the above note, examined the patient and agree with plan to limit fluid and salt intake, strict diet free of salt. Daily weights. May be discharged tomorrow, Xifaxan 550 bid, Lactulose.We should tell Dr Ethelene Hal that pt is not a candidate for back surgery at this time.( pt is counting on having the surgery)  [facility] Pager # [phone]

## 2012-12-09 NOTE — Procedures (Signed)
Successful placement of dual lumen PICC line to right brachial vein. Length 41cm Tip at lower SVC/RA No complications Ready for use.  Brayton El PA-C Interventional Radiology 12/09/2012 1:40 PM

## 2012-12-09 NOTE — Progress Notes (Signed)
[facility] PROGRESS NOTE  Tony Boyd ZOX:[id number] DOB: 1956-01-22 DOA: 12/03/2012 PCP: [name], DO  Assessment/Plan: 1. Hepatic encephalopathy  -Ammonia levels 12/05/2012 have climbed to 164; Ammonia level 12/08/2012= 128, up from his low during this hospitalization of 93   -Decrease Lactulose to home dose of 5 times/day and continue Rifaximin( xifaxan ) current dose -After consulting with GI, okay not to repeat Ammonia level as long as patient asymptomatic -Patient currently A./O. x4 with negative asterixis  2. Dyspnea, rule out CHF in a patient with elevated pro-BNP, anasarca  -Elevated pro-BNP and diffuse anasarca noted NO CHF; see echocardiogram results below.  -Abdominal Distention, not ascites by ultrasound; Pt presented to [place] dept today for possible paracentesis. On [place] abd in all four quadrants there is no significant ascites present. Procedure was cancelled. -Check daily weights/strict I&O; Reinsert Foley   3. Esophageal varices -Review of EPIC shows patient visited Dr. Melvia Heaps (gastroenterologist) on   04/15/2012 patient was instructed to follow;   a.  Recommended dose by Dr. Arlyce Dice nadolol 40 mg daily; Inc to 40 mg           12/08/2012. Monitior BP closely   b. Repeat endoscopy (timeframe not mentioned); contacted [facility]      Gastroenterology spoke with NP Willette Cluster) Dr. Melvia Heaps       will see in Am 10/14;  We can also stop ammonia check   c. Hepatitis A (negative),       Hepatitis B surface AG (negative)/ hep B core antibody and surface antibody (negative).       Hepatitis C AB (positive ) -Obtain a hepatitis C viral load (pending)  4. Hyponatremia  -Likely secondary to cirrhosis physiology (SIADH) Continue diuretic therapy.  -Standing weight 10/13 (used as baseline weight standing) = 118.5kg;  -Standing Weight 10/14 = 116.3kg - Change to BID sodium check; 12/09/2012 at 1758 NA=126.  -Continue fluid restrictions.  -Obtain Serum  Osm/Urine Osm, Urine Creatine and electrolytes - Spoke with Dr. Ashley Royalty (nephrologist). Recommended stopping sodium tablets. Placing Foley for better measurement of I./O.  5. Hyperkalemia -DC'd potassium, potassium level now within normal limits will continue to monitor closely  6. Cirrhosis  -Appears to be end stage. Has significant thrombocytopenia, elevated bilirubin, hyponatremia and low albumin. H/O coagulopathy as well.   7 Thrombocytopenia, acquired  -Avoid LMWH. Use SCDs for DVT prophylaxis.  -Stable but extremely low, continue to monitor for bleeding  8. Hypothyroidism  -Continue Synthroid. TSH recently checked and was 3.470.   9. Diabetes mellitus type 2, uncontrolled, with complications  -Continue Lantus 20 units daily and 6 units of NovoLog Q AC. Add SSI. -CBGs controlled    10. Hypokalemia  -Resolved, currently potassium supplements on hold cont. Monitor QD  11.Hypomagnesemia -Slightly low, will replete  12. Normocytic anemia  -Likely anemia of chronic disease.  -Most likely Stable (lab H./H. have been bearing significantly on all patient's) will recheck in the a.m.  13. Seizure disorder  -Continue Keppra   14. H/O bipolar disorder  -Continue Risperdal.  15. PTSD -Continue Paxil 20 mg QD    Code Status:  Family Communication:  Disposition Plan:    Consultants:   Procedures:  Echocardiogram 12/04/2012;  - Left ventricle: The cavity size was normal. Systolic function was normal.  -LVEF=  55% to 60%. - Mitral valve: Mildly calcified annulus. - Left atrium: The atrium was moderately dilated.     Antibiotics:  Rifaximin(hepatic encephalopathy)   HPI/Subjective: Tony Boyd is an 57  y.o.WM  PMHx Grade 3 varices/Portal HTN Gastropathy (Dx 05/06/2012 by EGD Dr Melvia Heaps), Cirrhosis/ESLD secondary to ETOH abuse and hepatitis C who was brought to the hospital with an acute confusional state. The patient tells me he has been short of breath  since 6 a.m., and that he has had back and hip pain. The patient tells me he lives in an independent living facility, and that 2 daughters provide his care including providing him with meals. He tells me he takes 5 doses of Lactulose a day, and that he has been compliant with his medications. He is noted to be tremulous and grossly volume overloaded on initial exam. Pro-BNP is elevated at 297.7. He has not had a 2-D Echocardiogram. He has not had any alcohol in over a year. He is otherwise alert and conversant. Upon initial evaluation in the ER, the patient was found to have some atelectasis and vascular congestion on a CXR, and his ammonia levels were elevated, so he was referred for inpatient evaluation. He has had 8 admissions in the past 6 months. 12/04/2012 patient states he is feeling much better like to know how long he needs to remain in the hospital.   Patient feels he is back to baseline with his cognitive ability, and that his asterixis has 12/05/2012 ambulating around the room, A./O. x4, negative asterixis. 12/06/2012 A./O. x4 resting comfortably in bed watching television; states negative signs symptoms of seizure activity or cognitive changes. 12/07/2012 states she's feeling much better, has been ambulating around the room, negative SOB, CP, negative cognitive changes. Patient's only concern is his anxiety/PTSD. Requested to know should we increase his Xanax for use a better medication. 12/08/2012 states feels that his abdomen has decreased in girth, is softer however has some tenderness today, and positive nausea. TODAY in good spirits, no complaints     Objective: Filed Vitals:   12/09/12 1357 12/09/12 1450 12/09/12 1530 12/09/12 1643  BP: 125/67 109/63 107/54 126/86  Pulse:  71 71 74  Temp: 98.1 F (36.7 C) 98.5 F (36.9 C) 98.1 F (36.7 C) 98.4 F (36.9 C)  TempSrc: Oral Oral Oral Oral  Resp:  20 20 20   Height:      Weight:      SpO2: 92% 95% 94% 93%    Intake/Output Summary  (Last 24 hours) at 12/09/12 1942 Last data filed at 12/09/12 0956  Gross per 24 hour  Intake    240 ml  Output   1250 ml  Net  -1010 ml   Filed Weights   12/08/12 0535 12/08/12 1627 12/09/12 0524  Weight: 128.6 kg (283 lb 8.2 oz) 118.5 kg (261 lb 3.9 oz) 116.3 kg (256 lb 6.3 oz)    Exam:   General:  A./O. x4,NAD  Cardiovascular: Regular rhythm and rate, negative murmurs rubs or gallops,  Respiratory: Clear to auscultation bilateral  Abdomen: Continuing decreased distention, negative tenderness, plus bowel sounds  Musculoskeletal: Bilateral pedal edema 2+ to mid shin   Data Reviewed: Basic Metabolic Panel:  Recent Labs Lab 12/05/12 0550 12/06/12 0601  12/07/12 0739  12/08/12 0542  12/08/12 0752 12/08/12 1200 12/08/12 1602 12/08/12 2319 12/09/12 0840 12/09/12 1758  NA 126* 119*  < > 118*  < > 122*  < >  --  122* 121* 125* 125* 126*  K 3.4* 3.8  --  4.6  --  5.2*  --  5.1  --   --   --  4.2  --   CL 91* 87*  --  87*  --  95*  --   --   --   --   --  96  --   CO2 29 27  --  25  --  24  --   --   --   --   --  28  --   GLUCOSE 137* 138*  --  125*  --  143*  --   --   --   --   --  129*  --   BUN 14 14  --  16  --  15  --   --   --   --   --  11  --   CREATININE 0.82 0.82  --  0.72  --  0.64  --   --   --   --   --  0.62  --   CALCIUM 7.8* 8.1*  --  8.4  --  8.2*  --   --   --   --   --  8.2*  --   MG 1.5 1.5  --  1.4*  --  1.4*  --   --   --   --   --  1.4*  --   < > = values in this interval not displayed. Liver Function Tests:  Recent Labs Lab 12/05/12 0550 12/06/12 0601 12/07/12 0739 12/08/12 0542 12/09/12 0840  AST 49* 54* 62* 69* 64*  ALT 22 23 25 28 25   ALKPHOS 205* 211* 217* 205* 178*  BILITOT 2.4* 2.5* 2.6* 2.7* 2.6*  PROT 6.1 6.2 6.5 6.6 6.1  ALBUMIN 1.9* 1.9* 2.2* 2.2* 2.0*   No results found for this basename: LIPASE, AMYLASE,  in the last 168 hours  Recent Labs Lab 12/03/12 1530 12/05/12 0550 12/05/12 1130 12/07/12 1604  AMMONIA 103*  164* 93* 128*   CBC:  Recent Labs Lab 12/05/12 0550 12/06/12 0601 12/07/12 0739 12/08/12 0542 12/09/12 0840  WBC 3.7* 3.9* 3.9* 4.1 3.5*  NEUTROABS 2.3 2.4 2.2 2.6 2.2  HGB 9.3* 9.4* 10.1* 9.6* 8.8*  HCT 26.4* 26.5* 27.9* 26.6* 24.7*  MCV 97.8 96.7 95.9 96.0 98.4  PLT 42* 41* 43* 39* 40*   Cardiac Enzymes:  Recent Labs Lab 12/03/12 1530  TROPONINI <0.30   BNP (last 3 results)  Recent Labs  12/03/12 1435  PROBNP 297.7*   CBG:  Recent Labs Lab 12/08/12 1648 12/08/12 1955 12/09/12 0726 12/09/12 1147 12/09/12 1625  GLUCAP 119* 170* 121* 172* 126*    No results found for this or any previous visit (from the past 240 hour(s)).   Studies: Ir Fluoro Guide Cv Line Left  12/09/2012   *RADIOLOGY REPORT*  Clinical Data: Chronic liver cirrhosis, poor venous access.  POWERED PICC LINE PLACEMENT WITH ULTRASOUND AND FLUOROSCOPIC GUIDANCE  Fluoroscopy Time: 0 minutes, 12 seconds  The right arm was prepped with chlorhexidine, draped in the usual sterile fashion using maximum barrier technique (cap and mask, sterile gown, sterile gloves, large sterile sheet, hand hygiene and cutaneous antisepsis) and infiltrated locally with 1% Lidocaine.  Ultrasound demonstrated patency of the right brachial vein, and this was documented with an image.  Under real-time ultrasound guidance, this vein was accessed with a 21 gauge micropuncture needle and image documentation was performed.  The needle was exchanged over a guidewire for a peel-away sheath through which a five [place] dual lumen PICC trimmed to 41 cm was advanced, positioned with its tip at the lower SVC/right atrial junction. Fluoroscopy during the procedure and fluoro  spot radiograph confirms appropriate catheter position.  The catheter was flushed, secured to the skin with Prolene sutures, and covered with a sterile dressing.  Complications:  None  IMPRESSION: Successful right arm poweredPICC line placement with ultrasound and  fluoroscopic guidance.  The catheter is ready for use.  Read by: Brayton El, P.A,-C   Original Report Authenticated By: Tacey Ruiz, MD   Ir US Guide Vasc Access Right  12/09/2012   *RADIOLOGY REPORT*  Clinical Data: Chronic liver cirrhosis, poor venous access.  POWERED PICC LINE PLACEMENT WITH ULTRASOUND AND FLUOROSCOPIC GUIDANCE  Fluoroscopy Time: 0 minutes, 12 seconds  The right arm was prepped with chlorhexidine, draped in the usual sterile fashion using maximum barrier technique (cap and mask, sterile gown, sterile gloves, large sterile sheet, hand hygiene and cutaneous antisepsis) and infiltrated locally with 1% Lidocaine.  Ultrasound demonstrated patency of the right brachial vein, and this was documented with an image.  Under real-time ultrasound guidance, this vein was accessed with a 21 gauge micropuncture needle and image documentation was performed.  The needle was exchanged over a guidewire for a peel-away sheath through which a five [place] dual lumen PICC trimmed to 41 cm was advanced, positioned with its tip at the lower SVC/right atrial junction. Fluoroscopy during the procedure and fluoro spot radiograph confirms appropriate catheter position.  The catheter was flushed, secured to the skin with Prolene sutures, and covered with a sterile dressing.  Complications:  None  IMPRESSION: Successful right arm poweredPICC line placement with ultrasound and fluoroscopic guidance.  The catheter is ready for use.  Read by: Brayton El, P.A,-C   Original Report Authenticated By: Tacey Ruiz, MD    Scheduled Meds: . ferrous sulfate  325 mg Oral QHS  . folic acid  1 mg Oral Daily  . furosemide  40 mg Oral BID  . insulin aspart  0-20 Units Subcutaneous TID WC  . insulin aspart  0-5 Units Subcutaneous QHS  . insulin aspart  6 Units Subcutaneous TID WC  . insulin glargine  20 Units Subcutaneous QHS  . lactulose  30 g Oral 6 X Daily  . levETIRAcetam  500 mg Oral BID  . levothyroxine  75 mcg  Oral QAC breakfast  . lidocaine      . nadolol  40 mg Oral Daily  . pantoprazole  40 mg Oral Daily  . PARoxetine  20 mg Oral Daily  . rifaximin  550 mg Oral BID  . risperiDONE  0.25 mg Oral BID  . sodium chloride  10-40 mL Intracatheter Q12H  . sodium chloride  3 mL Intravenous Q12H  . spironolactone  100 mg Oral Daily   Continuous Infusions:   Principal Problem:   Dyspnea, rule out CHF in a patient with elevated pro-BNP, anasarca Active Problems:   Hepatic encephalopathy   Hyponatremia   Cirrhosis   Esophageal varices without mention of bleeding   Seizure disorder   Thrombocytopenia, acquired   Hypothyroidism   Diabetes mellitus type 2, uncontrolled, with complications   Anasarca   Hypokalemia   Normocytic anemia   H/O bipolar disorder   Hypomagnesemia   PTSD (post-traumatic stress disorder)   Hyperkalemia    Time spent: 35 minutes    WOODS, CURTIS, J  [facility] Pager [phone]. If 7PM-7AM, please contact night-coverage at [email or web address], password [facility] 12/09/2012, 7:42 PM  LOS: 6 days

## 2012-12-09 NOTE — Progress Notes (Signed)
Physical Therapy Treatment Patient Details Name: Tony Boyd MRN: [id number] DOB: February 09, 1956 Today's Date: 12/09/2012 Time: 4098-1191 PT Time Calculation (min): 27 min  PT Assessment / Plan / Recommendation  History of Present Illness 57 yo male admitted with hepatic encephalopathy. Hx of Hep C, Sz, cirrhosis. From [facility] living   PT Comments   Pt c/o MAX fatigue and currently on strict in/out fluid restrictions. Assisted pt OOB to BR then amb in hallway.  Pt prefers to amb with walker for increased steadyness.   Follow Up Recommendations  Home health PT;No PT follow up     Does the patient have the potential to tolerate intense rehabilitation     Barriers to Discharge        Equipment Recommendations  None recommended by PT    Recommendations for Other Services    Frequency Min 3X/week   Progress towards PT Goals Progress towards PT goals: Progressing toward goals  Plan      Precautions / Restrictions Precautions Precautions: Fall Precaution Comments: strict in/out Restrictions Weight Bearing Restrictions: No    Pertinent Vitals/Pain C/o R side ABD pain    Mobility  Bed Mobility Bed Mobility: Supine to Sit;Sit to Supine Supine to Sit: 6: Modified independent (Device/Increase time) Sit to Supine: 6: Modified independent (Device/Increase time) Details for Bed Mobility Assistance: increased time Transfers Transfers: Sit to Stand;Stand to Sit Sit to Stand: 6: Modified independent (Device/Increase time);5: Supervision Stand to Sit: 6: Modified independent (Device/Increase time);5: Supervision Details for Transfer Assistance: <25% VC's safety with turns and backward gait using RW Ambulation/Gait Ambulation/Gait Assistance: 6: Modified independent (Device/Increase time);5: Supervision Ambulation Distance (Feet): 220 Feet Assistive device: Rolling walker Ambulation/Gait Assistance Details: Pt prefers the use of a walker for increased safety and  steadyness.  Required <25% VC's safety with turn completion and backward gait. Gait Pattern: Antalgic;Decreased stride length     PT Goals (current goals can now be found in the care plan section)    Visit Information  Last PT Received On: 12/09/12 Assistance Needed: +1 History of Present Illness: 57 yo male admitted with hepatic encephalopathy. Hx of Hep C, Sz, cirrhosis. From [facility]    Subjective Data      Cognition       Balance     End of Session PT - End of Session Equipment Utilized During Treatment: Gait belt Activity Tolerance: Patient tolerated treatment well Patient left: in bed;with call bell/phone within reach   Felecia Shelling  PTA [facility]  Acute  Rehab Pager      [phone]

## 2012-12-10 DIAGNOSIS — D689 Coagulation defect, unspecified: Secondary | ICD-10-CM

## 2012-12-10 LAB — CBC WITH DIFFERENTIAL/PLATELET
Basophils Relative: 0 % (ref 0–1)
Eosinophils Absolute: 0.2 10*3/uL (ref 0.0–0.7)
HCT: 24.4 % — ABNORMAL LOW (ref 39.0–52.0)
Hemoglobin: 8.5 g/dL — ABNORMAL LOW (ref 13.0–17.0)
Lymphocytes Relative: 24 % (ref 12–46)
Lymphs Abs: 0.8 10*3/uL (ref 0.7–4.0)
MCH: 34.6 pg — ABNORMAL HIGH (ref 26.0–34.0)
MCHC: 34.8 g/dL (ref 30.0–36.0)
Monocytes Relative: 11 % (ref 3–12)
Neutrophils Relative %: 58 % (ref 43–77)
RBC: 2.46 MIL/uL — ABNORMAL LOW (ref 4.22–5.81)

## 2012-12-10 LAB — GLUCOSE, CAPILLARY
Glucose-Capillary: 107 mg/dL — ABNORMAL HIGH (ref 70–99)
Glucose-Capillary: 164 mg/dL — ABNORMAL HIGH (ref 70–99)
Glucose-Capillary: 93 mg/dL (ref 70–99)

## 2012-12-10 LAB — COMPREHENSIVE METABOLIC PANEL
ALT: 26 U/L (ref 0–53)
AST: 60 U/L — ABNORMAL HIGH (ref 0–37)
Albumin: 2.1 g/dL — ABNORMAL LOW (ref 3.5–5.2)
Alkaline Phosphatase: 196 U/L — ABNORMAL HIGH (ref 39–117)
Calcium: 8.3 mg/dL — ABNORMAL LOW (ref 8.4–10.5)
Chloride: 95 mEq/L — ABNORMAL LOW (ref 96–112)
GFR calc Af Amer: >90 mL/min (ref >90)
GFR calc non Af Amer: >90 mL/min (ref >90)
Glucose, Bld: 116 mg/dL — ABNORMAL HIGH (ref 70–99)
Potassium: 3.5 mEq/L (ref 3.5–5.1)
Sodium: 127 mEq/L — ABNORMAL LOW (ref 135–145)
Total Protein: 6.2 g/dL (ref 6.0–8.3)

## 2012-12-10 LAB — HEPATITIS C VRS RNA DETECT BY PCR-QUAL: Hepatitis C Vrs RNA by PCR-Qual: POSITIVE — AB

## 2012-12-10 LAB — SODIUM: Sodium: 123 mEq/L — ABNORMAL LOW (ref 135–145)

## 2012-12-10 MED ORDER — FUROSEMIDE 40 MG PO TABS
60.0000 mg | ORAL_TABLET | Freq: Two times a day (BID) | ORAL | Status: DC
  Administered 2012-12-11 (×2): 60 mg via ORAL
  Filled 2012-12-10 (×3): qty 1

## 2012-12-10 MED ORDER — POTASSIUM CHLORIDE CRYS ER 20 MEQ PO TBCR
20.0000 meq | EXTENDED_RELEASE_TABLET | Freq: Once | ORAL | Status: AC
  Administered 2012-12-10: 20 meq via ORAL
  Filled 2012-12-10: qty 1

## 2012-12-10 MED ORDER — POTASSIUM CHLORIDE 20 MEQ PO PACK
20.0000 meq | PACK | Freq: Once | ORAL | Status: DC
  Filled 2012-12-10: qty 1

## 2012-12-10 NOTE — Progress Notes (Signed)
[facility] Progress Note   Subjective  Awoken from sleep. No complaints  Objective   Vital signs in last 24 hours: Temp:  [98.1 F (36.7 C)-98.8 F (37.1 C)] 98.8 F (37.1 C) (10/15 0604) Pulse Rate:  [71-109] 71 (10/15 0604) Resp:  [20] 20 (10/15 0604) BP: (107-126)/(54-86) 118/72 mmHg (10/15 0604) SpO2:  [92 %-97 %] 97 % (10/15 0604) Weight:  [260 lb 11.2 oz (118.253 kg)] 260 lb 11.2 oz (118.253 kg) (10/15 0700) Last BM Date: 12/09/12 General:    white male in NAD  Abdomen:  Soft, protuberant, nontender,  Normal bowel sounds. Extremities:  Pitting edema of BLE. Neurologic:  Alert and oriented,  grossly normal neurologically. Psych:  Cooperative. Normal mood and affect.  Lab Results:  Recent Labs  12/08/12 0542 12/09/12 0840 12/10/12 0525  WBC 4.1 3.5* 3.4*  HGB 9.6* 8.8* 8.5*  HCT 26.6* 24.7* 24.4*  PLT 39* 40* 34*   BMET  Recent Labs  12/08/12 0542  12/08/12 0752  12/09/12 0840 12/09/12 1758 12/10/12 0525  NA 122*  < >  --   < > 125* 126* 127*  K 5.2*  --  5.1  --  4.2  --  3.5  CL 95*  --   --   --  96  --  95*  CO2 24  --   --   --  28  --  29  GLUCOSE 143*  --   --   --  129*  --  116*  BUN 15  --   --   --  11  --  10  CREATININE 0.64  --   --   --  0.62  --  0.65  CALCIUM 8.2*  --   --   --  8.2*  --  8.3*  < > = values in this interval not displayed. LFT  Recent Labs  12/10/12 0525  PROT 6.2  ALBUMIN 2.1*  AST 60*  ALT 26  ALKPHOS 196*  BILITOT 2.0*     Assessment / Plan:   1. Decompensated cirrhosis with hepatic encephalopathy. Patient alert and oriented with minimal asterixis. Patient did not have banding of varices on EGD in March 2014. Varices were non-bleeding and stable in size on Nadolol. He is not due for surveillance EGD until March 2015. He can be discharged from GI standpoint.    continue Xifaxan and lactulose upon discharge.   2gm sodium. Patient needs to adhere to 2 gm sodium diet at home and avoid salt shaker  as well as pickles.  Continue nadolol  Continue Lasix and Aldactone. There is plenty of room to titrate up (electrolytes / renal function allowing) but I think that better compliance with low sodium diet at home should be tried first.  His sodium if now at 127 which is typical (though poor prognostic indicator) in cirrhosis. Our office will call patient with a follow up appointment.  Patient should be instructed on daily weights at home and keep log  2. AFP elevation of 130 on 10/06/12. Last U/S done 10/12/12 didn't show any suspicious liver lesions but this will need close monitoring.     LOS: 7 days   Willette Cluster  12/10/2012, 9:04 AM Attending MD note:   I have reviewed the above note, examined the patient and agree with plan of treatment.He is ready for discharge.  [facility] Pager # [phone]

## 2012-12-10 NOTE — Discharge Summary (Signed)
Physician Discharge Summary  Tony Boyd ZOX:[id number] DOB: [birth year] DOA: 12/03/2012  PCP: [name], DO  Admit date: 12/03/2012 Discharge date: 12/11/2012  Time spent: 40 minutes  Recommendations for Outpatient Follow-up:  1. Hepatic encephalopathy  -Ammonia levels 12/05/2012 have climbed to 164; Ammonia level 12/08/2012= 128, up from his low during this hospitalization of 93  -Decrease Lactulose to home dose of 5 times/day and continue Rifaximin( xifaxan ) current dose  -After consulting with GI, okay not to repeat Ammonia level as long as patient asymptomatic  -Patient currently A./O. x4 with negative asterixis  2. Dyspnea, rule out CHF in a patient with elevated pro-BNP, anasarca  -Elevated pro-BNP and diffuse anasarca noted NO CHF; see echocardiogram results below.  -Abdominal Distention, not ascites by ultrasound; Pt presented to [place] dept today for possible paracentesis. On [place] abd in all four quadrants there is no significant ascites present. Procedure was cancelled. -Patient to contact Dr. Lina Sar (gastroenterology) for followup appointment   3. Esophageal varices  -Review of EPIC shows patient visited Dr. Melvia Heaps (gastroenterologist) on 04/15/2012 patient was instructed to follow;  a. Recommended dose by Dr. Arlyce Dice nadolol 40 mg daily; Inc to 40 mg  12/08/2012. Monitior BP closely  b. Repeat endoscopy (timeframe not mentioned); contacted [facility]  Gastroenterology spoke with NP Willette Cluster) Dr. Melvia Heaps  will saw patient on Am 10/14; We can also stop ammonia check  c. Hepatitis A (negative),  Hepatitis B surface AG (negative)/ hep B core antibody and surface antibody (negative).  Hepatitis C AB (positive )  -Obtain a hepatitis C viral load (pending). Will address when patient follows up with gastroenterologist  -Patient to contact Dr. Lina Sar (gastroenterology) for followup appointment  4. Hyponatremia  -Likely secondary to cirrhosis physiology  (SIADH) Continue diuretic therapy.  -Standing weight 10/13 (used as baseline weight standing) = 118.5kg;   -Standing Weight 10/15 = 117.34kg (259 pounds) his base weight, counseled to weigh himself daily at approximately the same time each day  - 12/10/2012 at 1800 NA=123. (Patient counseled that his sodiums should remain in the 120s). If there are any cognitive changes patient is to proceed to nearest emergency room. - Spoke with Dr. Ashley Royalty (nephrologist). Recommended stopping sodium tablets. Placing Foley for better measurement of I./O. -increased Lasix to 60 mg BID.    5. Hyperkalemia  -Restart potassium. Followup with PCP and monitor closely his electrolytes to include potassium, magnesium    6. Cirrhosis  -Appears to be end stage. Has significant thrombocytopenia, elevated bilirubin, hyponatremia and low albumin. H/O coagulopathy as well.   7 Thrombocytopenia, acquired  -Avoid LMWH. And NSAIDs which could increase patient's risk of bleeding  8. Hypothyroidism  -Continue his current Synthroid dose. TSH recently checked and was 3.470.   9. Diabetes mellitus type 2, uncontrolled, with complications  -Continue Lantus 20 units daily and 6 units of NovoLog Q AC.  -CBGs controlled   10. Hypokalemia  -See #5   11.Hypomagnesemia  -See #5   12. Normocytic anemia  -Likely anemia of chronic disease.  -PCP to follow closely    13. Seizure disorder  -Continue Keppra. Patient has not had a seizure in over a year would have PCP arrange for neurology followup at least one time per year   14. H/O bipolar disorder  -Continue Risperdal. Stable  15. PTSD  -Continue Paxil 20 mg QD. PCP could titrate dose as required to alleviate all symptoms. Currently patient doing well on current dose   Discharge Diagnoses:  Principal Problem:   Hepatic encephalopathy Active Problems:   Hyponatremia   Cirrhosis   Esophageal varices without mention of bleeding   Seizure disorder    Thrombocytopenia, acquired   Hypothyroidism   Diabetes mellitus type 2, uncontrolled, with complications   Anasarca   Hypokalemia   Normocytic anemia   Dyspnea, rule out CHF in a patient with elevated pro-BNP, anasarca   H/O bipolar disorder   Hypomagnesemia   PTSD (post-traumatic stress disorder)   Hyperkalemia   Discharge Condition: Stable  Diet recommendation: Heart healthy, sodium< 2gm/dy  Filed Weights   12/09/12 0524 12/10/12 0700 12/11/12 0428  Weight: 116.3 kg (256 lb 6.3 oz) 118.253 kg (260 lb 11.2 oz) 117.346 kg (258 lb 11.2 oz)    History of present illness:  Tony Boyd is an 57 y.o.WM PMHx Grade 3 varices/Portal HTN Gastropathy (Dx 05/06/2012 by EGD Dr Melvia Heaps), Cirrhosis/ESLD secondary to ETOH abuse and hepatitis C who was brought to the hospital with an acute confusional state. The patient tells me he has been short of breath since 6 a.m., and that he has had back and hip pain. The patient tells me he lives in an independent living facility, and that 2 daughters provide his care including providing him with meals. He tells me he takes 5 doses of Lactulose a day, and that he has been compliant with his medications. He is noted to be tremulous and grossly volume overloaded on initial exam. Pro-BNP is elevated at 297.7. He has not had a 2-D Echocardiogram. He has not had any alcohol in over a year. He is otherwise alert and conversant. Upon initial evaluation in the ER, the patient was found to have some atelectasis and vascular congestion on a CXR, and his ammonia levels were elevated, so he was referred for inpatient evaluation. He has had 8 admissions in the past 6 months. 12/04/2012 patient states he is feeling much better like to know how long he needs to remain in the hospital. Patient feels he is back to baseline with his cognitive ability, and that his asterixis has 12/05/2012 ambulating around the room, A./O. x4, negative asterixis. 12/06/2012 A./O. x4 resting  comfortably in bed watching television; states negative signs symptoms of seizure activity or cognitive changes. 12/07/2012 states she's feeling much better, has been ambulating around the room, negative SOB, CP, negative cognitive changes. Patient's only concern is his anxiety/PTSD. Requested to know should we increase his Xanax for use a better medication. 12/08/2012 states feels that his abdomen has decreased in girth, is softer however has some tenderness today, and positive nausea. 12/09/2012 in good spirits, no complaints 12/09/2012 patient doing well in good spirits ready for discharge in a.m. TODAY patient ambulating around the room, joking with medical staff, ready for discharge    Procedures:  Echocardiogram 12/04/2012;  - Left ventricle: The cavity size was normal. Systolic function was normal.  -LVEF= 55% to 60%. - Mitral valve: Mildly calcified annulus. - Left atrium: The atrium was moderately dilated.  Antibiotics:  Rifaximin(hepatic encephalopathy)    Consultations:  Gastroenterology (Dr.Dora Juanda Chance)    Discharge Exam: Filed Vitals:   12/10/12 1400 12/10/12 2100 12/11/12 0428 12/11/12 0509  BP: 122/69 117/67  110/65  Pulse: 79 68  71  Temp: 98.5 F (36.9 C) 98.3 F (36.8 C)  97.2 F (36.2 C)  TempSrc: Oral Oral  Oral  Resp: 18 18  16   Height:      Weight:   117.346 kg (258 lb 11.2 oz)  SpO2: 98% 98%  94%   General: A./O. x4,NAD  Cardiovascular: Regular rhythm and rate, negative murmurs rubs or gallops,  Respiratory: Clear to auscultation bilateral  Abdomen: Continuing decreased distention, negative tenderness, plus bowel sounds  Musculoskeletal: Bilateral pedal edema 2+ to mid shin   Discharge Instructions       Future Appointments Provider Department Dept Phone   01/21/2013 8:45 AM Louis Meckel, MD [facility] [phone]       Medication List    ASK your doctor about these medications       ALPRAZolam 1 MG tablet   Commonly known as:  XANAX  Take 1 tablet (1 mg total) by mouth at bedtime as needed for sleep.     ferrous sulfate 325 (65 FE) MG tablet  Take 325 mg by mouth at bedtime.     folic acid 1 MG tablet  Commonly known as:  FOLVITE  Take 1 mg by mouth daily.     furosemide 40 MG tablet  Commonly known as:  LASIX  Take 40 mg by mouth 2 (two) times daily.     insulin aspart 100 UNIT/ML injection  Commonly known as:  novoLOG  Inject 6 Units into the skin 3 (three) times daily with meals.     insulin glargine 100 UNIT/ML injection  Commonly known as:  LANTUS  Inject 20 Units into the skin at bedtime.     lactulose 10 GM/15ML solution  Commonly known as:  CHRONULAC  Take 45 mLs (30 g total) by mouth 5 (five) times daily.     levETIRAcetam 500 MG tablet  Commonly known as:  KEPPRA  Take 500 mg by mouth 2 (two) times daily.     levothyroxine 75 MCG tablet  Commonly known as:  SYNTHROID, LEVOTHROID  Take 75 mcg by mouth daily.     omeprazole 20 MG capsule  Commonly known as:  PRILOSEC  Take 20 mg by mouth daily.     Oxycodone HCl 10 MG Tabs  Take 1 tablet (10 mg total) by mouth every 4 (four) hours.     potassium chloride 10 MEQ tablet  Commonly known as:  K-DUR,KLOR-CON  Take 10 mEq by mouth daily.     rifaximin 550 MG Tabs tablet  Commonly known as:  XIFAXAN  Take 550 mg by mouth 2 (two) times daily.     risperiDONE 0.25 MG tablet  Commonly known as:  RISPERDAL  Take 0.25 mg by mouth 2 (two) times daily.     spironolactone 100 MG tablet  Commonly known as:  ALDACTONE  Take 100 mg by mouth daily.       Allergies  Allergen Reactions  . Ativan [Lorazepam] Other (See Comments)    "hallucinations"  . Droperidol Other (See Comments)    unknown  . Ketorolac Other (See Comments)    unknown  . Penicillins Swelling and Other (See Comments)    unkown  . Toradol [Ketorolac Tromethamine] Hives   Follow-up Information   Follow up with Melvia Heaps, MD.   Specialty:   Gastroenterology   Contact information:   [address] [place] [address] [phone]        The results of significant diagnostics from this hospitalization (including imaging, microbiology, ancillary and laboratory) are listed below for reference.    Significant Diagnostic Studies: Dg Chest 2 View  12/03/2012   CLINICAL DATA:  Hepatitis-C. Fatigue. Short of breath.  EXAM: CHEST  2 VIEW  COMPARISON:  11/12/2012  FINDINGS:  Lungs are under aerated with bibasilar atelectasis. Mild cardiomegaly and vascular congestion without evidence of interstitial edema. No pleural effusion. No pneumothorax.  IMPRESSION: Mild cardiomegaly and vascular congestion without edema.  Bibasilar atelectasis.   Electronically Signed   By: Maryclare Bean M.D.   On: 12/03/2012 15:20   Dg Chest 2 View  11/12/2012   CLINICAL DATA:  Altered mental status  EXAM: CHEST  2 VIEW  COMPARISON:  10/12/2012  FINDINGS: Chronic cardiopericardial enlargement. Upper mediastinal contours are unchanged. Unchanged diffuse interstitial prominence, with cephalized pulmonary blood flow. No focal opacity, definite effusion, or pneumothorax.  IMPRESSION: Cardiomegaly and pulmonary venous hypertension.   Electronically Signed   By: Tiburcio Pea   On: 11/12/2012 02:27   Ct Head Wo Contrast  11/12/2012   *RADIOLOGY REPORT*  Clinical Data: Altered mental status, history of bipolar disorder, coursing cough psychosis.  CT HEAD WITHOUT CONTRAST  Technique:  Contiguous axial images were obtained from the base of the skull through the vertex without contrast.  Comparison: CT of the head  09/16/2012  Findings:  The ventricles and sulci are normal for patient's age. No intraparenchymal hemorrhage, mass effect, midline shift or focal abnormal hypodensities.  No acute large vascular territory infarcts.  No abnormal extra-axial fluid collections, nor extraaxial masses. Basal cisterns are patent.  Small left maxillary mucosal retention cyst partially  imaged, no paranasal sinus air-fluid levels.  Mastoid air cells appear well aerated.  Moderate temporomandibular osteoarthrosis.  No skull fracture.  Ocular globes and orbital contents are not suspicious though not tailored for evaluation.  IMPRESSION: No acute intracranial process; stable appearance of the head from September 16, 2012.   Original Report Authenticated By: Awilda Metro   US Abdomen Limited  12/04/2012   CLINICAL DATA:  Cirrhosis, assessment for ascites and drainage  EXAM: LIMITED ABDOMEN ULTRASOUND FOR ASCITES  TECHNIQUE: Limited ultrasound survey for ascites was performed in all four abdominal quadrants.  COMPARISON:  10/15/2012  FINDINGS: No significant ascites identified.  Paracentesis not performed.  IMPRESSION: No significant ascites identified.   Electronically Signed   By: Ulyses Southward M.D.   On: 12/04/2012 14:16   Ir Fluoro Guide Cv Line Left  12/09/2012   *RADIOLOGY REPORT*  Clinical Data: Chronic liver cirrhosis, poor venous access.  POWERED PICC LINE PLACEMENT WITH ULTRASOUND AND FLUOROSCOPIC GUIDANCE  Fluoroscopy Time: 0 minutes, 12 seconds  The right arm was prepped with chlorhexidine, draped in the usual sterile fashion using maximum barrier technique (cap and mask, sterile gown, sterile gloves, large sterile sheet, hand hygiene and cutaneous antisepsis) and infiltrated locally with 1% Lidocaine.  Ultrasound demonstrated patency of the right brachial vein, and this was documented with an image.  Under real-time ultrasound guidance, this vein was accessed with a 21 gauge micropuncture needle and image documentation was performed.  The needle was exchanged over a guidewire for a peel-away sheath through which a five [place] dual lumen PICC trimmed to 41 cm was advanced, positioned with its tip at the lower SVC/right atrial junction. Fluoroscopy during the procedure and fluoro spot radiograph confirms appropriate catheter position.  The catheter was flushed, secured to the skin with  Prolene sutures, and covered with a sterile dressing.  Complications:  None  IMPRESSION: Successful right arm poweredPICC line placement with ultrasound and fluoroscopic guidance.  The catheter is ready for use.  Read by: Brayton El, P.A,-C   Original Report Authenticated By: Tacey Ruiz, MD   Ir US Guide Vasc Access Right  12/09/2012   *  RADIOLOGY REPORT*  Clinical Data: Chronic liver cirrhosis, poor venous access.  POWERED PICC LINE PLACEMENT WITH ULTRASOUND AND FLUOROSCOPIC GUIDANCE  Fluoroscopy Time: 0 minutes, 12 seconds  The right arm was prepped with chlorhexidine, draped in the usual sterile fashion using maximum barrier technique (cap and mask, sterile gown, sterile gloves, large sterile sheet, hand hygiene and cutaneous antisepsis) and infiltrated locally with 1% Lidocaine.  Ultrasound demonstrated patency of the right brachial vein, and this was documented with an image.  Under real-time ultrasound guidance, this vein was accessed with a 21 gauge micropuncture needle and image documentation was performed.  The needle was exchanged over a guidewire for a peel-away sheath through which a five [place] dual lumen PICC trimmed to 41 cm was advanced, positioned with its tip at the lower SVC/right atrial junction. Fluoroscopy during the procedure and fluoro spot radiograph confirms appropriate catheter position.  The catheter was flushed, secured to the skin with Prolene sutures, and covered with a sterile dressing.  Complications:  None  IMPRESSION: Successful right arm poweredPICC line placement with ultrasound and fluoroscopic guidance.  The catheter is ready for use.  Read by: Brayton El, P.A,-C   Original Report Authenticated By: Tacey Ruiz, MD    Microbiology: No results found for this or any previous visit (from the past 240 hour(s)).   Labs: Basic Metabolic Panel:  Recent Labs Lab 12/07/12 0739  12/08/12 0542  12/08/12 0752  12/09/12 0840 12/09/12 1758 12/10/12 0525  12/10/12 1010 12/10/12 1800 12/11/12 0515  NA 118*  < > 122*  < >  --   < > 125* 126* 127* 123* 123* 124*  K 4.6  --  5.2*  --  5.1  --  4.2  --  3.5  --   --  3.4*  CL 87*  --  95*  --   --   --  96  --  95*  --   --  92*  CO2 25  --  24  --   --   --  28  --  29  --   --  28  GLUCOSE 125*  --  143*  --   --   --  129*  --  116*  --   --  98  BUN 16  --  15  --   --   --  11  --  10  --   --  10  CREATININE 0.72  --  0.64  --   --   --  0.62  --  0.65  --   --  0.70  CALCIUM 8.4  --  8.2*  --   --   --  8.2*  --  8.3*  --   --  8.2*  MG 1.4*  --  1.4*  --   --   --  1.4*  --  1.7  --   --  1.5  < > = values in this interval not displayed. Liver Function Tests:  Recent Labs Lab 12/07/12 0739 12/08/12 0542 12/09/12 0840 12/10/12 0525 12/11/12 0515  AST 62* 69* 64* 60* 63*  ALT 25 28 25 26 29   ALKPHOS 217* 205* 178* 196* 196*  BILITOT 2.6* 2.7* 2.6* 2.0* 2.7*  PROT 6.5 6.6 6.1 6.2 6.2  ALBUMIN 2.2* 2.2* 2.0* 2.1* 2.2*   No results found for this basename: LIPASE, AMYLASE,  in the last 168 hours  Recent Labs Lab 12/05/12 0550 12/05/12 1130 12/07/12 1604  AMMONIA 164* 93* 128*   CBC:  Recent Labs Lab 12/07/12 0739 12/08/12 0542 12/09/12 0840 12/10/12 0525 12/11/12 0515  WBC 3.9* 4.1 3.5* 3.4* 3.9*  NEUTROABS 2.2 2.6 2.2 2.0 2.4  HGB 10.1* 9.6* 8.8* 8.5* 8.3*  HCT 27.9* 26.6* 24.7* 24.4* 24.0*  MCV 95.9 96.0 98.4 99.2 98.4  PLT 43* 39* 40* 34* 36*   Cardiac Enzymes: No results found for this basename: CKTOTAL, CKMB, CKMBINDEX, TROPONINI,  in the last 168 hours BNP: BNP (last 3 results)  Recent Labs  12/03/12 1435  PROBNP 297.7*   CBG:  Recent Labs Lab 12/10/12 0725 12/10/12 1126 12/10/12 1634 12/10/12 2155 12/11/12 0727  GLUCAP 107* 140* 164* 93 94       Signed:  Carolyne Littles, MD [facility] [phone] pager

## 2012-12-10 NOTE — Progress Notes (Addendum)
[facility] PROGRESS NOTE  Tony Boyd ZOX:[id number] DOB: December 31, 1955 DOA: 12/03/2012 PCP: [name], DO  Assessment/Plan: 1. Hepatic encephalopathy  -Ammonia levels 12/05/2012 have climbed to 164; Ammonia level 12/08/2012= 128, up from his low during this hospitalization of 93   -Decrease Lactulose to home dose of 5 times/day and continue Rifaximin( xifaxan ) current dose -After consulting with GI, okay not to repeat Ammonia level as long as patient asymptomatic -Patient currently A./O. x4 with negative asterixis  2. Dyspnea, rule out CHF in a patient with elevated pro-BNP, anasarca  -Elevated pro-BNP and diffuse anasarca noted NO CHF; see echocardiogram results below.  -Abdominal Distention, not ascites by ultrasound; Pt presented to [place] dept today for possible paracentesis. On [place] abd in all four quadrants there is no significant ascites present. Procedure was cancelled. -Check daily weights/strict I&O; Reinsert Foley -   3. Esophageal varices -Review of EPIC shows patient visited Dr. Melvia Heaps (gastroenterologist) on   04/15/2012 patient was instructed to follow;   a.  Recommended dose by Dr. Arlyce Dice nadolol 40 mg daily; Inc to 40 mg           12/08/2012. Monitior BP closely   b. Repeat endoscopy (timeframe not mentioned); contacted [facility]      Gastroenterology spoke with NP Willette Cluster) Dr. Melvia Heaps       will see in Am 10/14;  We can also stop ammonia check   c. Hepatitis A (negative),       Hepatitis B surface AG (negative)/ hep B core antibody and surface antibody (negative).       Hepatitis C AB (positive ) -Obtain a hepatitis C viral load (pending)  4. Hyponatremia  -Likely secondary to cirrhosis physiology (SIADH) Continue diuretic therapy.  -Standing weight 10/13 (used as baseline weight standing) = 118.5kg;  -Standing Weight 10/15 = 118.25kg - Continue CMP QD; 12/10/2012 at 1800 NA=123.  -Continue fluid restrictions.  -Obtain Serum Osm/Urine Osm,  Urine Creatine and electrolytes - Spoke with Dr. Ashley Royalty (nephrologist). Recommended stopping sodium tablets. Placing Foley for better measurement of I./O. -increase Lasix to 60 mg BID  5. Hyperkalemia -Restart potassium. At the bottom of abnormal range, and will quickly become hypokalemic with continued  diuretic  6. Cirrhosis  -Appears to be end stage. Has significant thrombocytopenia, elevated bilirubin, hyponatremia and low albumin. H/O coagulopathy as well.   7 Thrombocytopenia, acquired  -Avoid LMWH. Use SCDs for DVT prophylaxis.  -Stable but extremely low, continue to monitor for bleeding  8. Hypothyroidism  -Continue Synthroid. TSH recently checked and was 3.470.   9. Diabetes mellitus type 2, uncontrolled, with complications  -Continue Lantus 20 units daily and 6 units of NovoLog Q AC. Add SSI. -CBGs controlled    10. Hypokalemia  -Resolved, restart potassium supplements.  Monitor potassium QD  11.Hypomagnesemia -Slightly low, will replete  12. Normocytic anemia  -Likely anemia of chronic disease.  -Most likely Stable (lab H./H. have been bearing significantly on all patient's) will recheck in the a.m.  13. Seizure disorder  -Continue Keppra   14. H/O bipolar disorder  -Continue Risperdal.  15. PTSD -Continue Paxil 20 mg QD    Code Status:  Family Communication:  Disposition Plan:    Consultants:   Procedures:  Echocardiogram 12/04/2012;  - Left ventricle: The cavity size was normal. Systolic function was normal.  -LVEF=  55% to 60%. - Mitral valve: Mildly calcified annulus. - Left atrium: The atrium was moderately dilated.     Antibiotics:  Rifaximin(hepatic encephalopathy)  HPI/Subjective: Tony Boyd is an 57 y.o.WM  PMHx Grade 3 varices/Portal HTN Gastropathy (Dx 05/06/2012 by EGD Dr Melvia Heaps), Cirrhosis/ESLD secondary to ETOH abuse and hepatitis C who was brought to the hospital with an acute confusional state. The patient tells  me he has been short of breath since 6 a.m., and that he has had back and hip pain. The patient tells me he lives in an independent living facility, and that 2 daughters provide his care including providing him with meals. He tells me he takes 5 doses of Lactulose a day, and that he has been compliant with his medications. He is noted to be tremulous and grossly volume overloaded on initial exam. Pro-BNP is elevated at 297.7. He has not had a 2-D Echocardiogram. He has not had any alcohol in over a year. He is otherwise alert and conversant. Upon initial evaluation in the ER, the patient was found to have some atelectasis and vascular congestion on a CXR, and his ammonia levels were elevated, so he was referred for inpatient evaluation. He has had 8 admissions in the past 6 months. 12/04/2012 patient states he is feeling much better like to know how long he needs to remain in the hospital.   Patient feels he is back to baseline with his cognitive ability, and that his asterixis has 12/05/2012 ambulating around the room, A./O. x4, negative asterixis. 12/06/2012 A./O. x4 resting comfortably in bed watching television; states negative signs symptoms of seizure activity or cognitive changes. 12/07/2012 states she's feeling much better, has been ambulating around the room, negative SOB, CP, negative cognitive changes. Patient's only concern is his anxiety/PTSD. Requested to know should we increase his Xanax for use a better medication. 12/08/2012 states feels that his abdomen has decreased in girth, is softer however has some tenderness today, and positive nausea. 12/09/2012 in good spirits, no complaints TODAY patient doing well in good spirits ready for discharge in a.m.     Objective: Filed Vitals:   12/10/12 0604 12/10/12 0700 12/10/12 1400 12/10/12 2100  BP: 118/72  122/69 117/67  Pulse: 71  79 68  Temp: 98.8 F (37.1 C)  98.5 F (36.9 C) 98.3 F (36.8 C)  TempSrc: Oral  Oral Oral  Resp: 20  18 18    Height:      Weight:  118.253 kg (260 lb 11.2 oz)    SpO2: 97%  98% 98%    Intake/Output Summary (Last 24 hours) at 12/10/12 2108 Last data filed at 12/10/12 2102  Gross per 24 hour  Intake    130 ml  Output   1950 ml  Net  -1820 ml   Filed Weights   12/08/12 1627 12/09/12 0524 12/10/12 0700  Weight: 118.5 kg (261 lb 3.9 oz) 116.3 kg (256 lb 6.3 oz) 118.253 kg (260 lb 11.2 oz)    Exam:   General:  A./O. x4,NAD  Cardiovascular: Regular rhythm and rate, negative murmurs rubs or gallops,  Respiratory: Clear to auscultation bilateral  Abdomen: Continuing decreased distention, negative tenderness, plus bowel sounds  Musculoskeletal: Bilateral pedal edema 2+ to mid shin   Data Reviewed: Basic Metabolic Panel:  Recent Labs Lab 12/06/12 0601  12/07/12 0739  12/08/12 0542  12/08/12 0752  12/09/12 0840 12/09/12 1758 12/10/12 0525 12/10/12 1010 12/10/12 1800  NA 119*  < > 118*  < > 122*  < >  --   < > 125* 126* 127* 123* 123*  K 3.8  --  4.6  --  5.2*  --  5.1  --  4.2  --  3.5  --   --   CL 87*  --  87*  --  95*  --   --   --  96  --  95*  --   --   CO2 27  --  25  --  24  --   --   --  28  --  29  --   --   GLUCOSE 138*  --  125*  --  143*  --   --   --  129*  --  116*  --   --   BUN 14  --  16  --  15  --   --   --  11  --  10  --   --   CREATININE 0.82  --  0.72  --  0.64  --   --   --  0.62  --  0.65  --   --   CALCIUM 8.1*  --  8.4  --  8.2*  --   --   --  8.2*  --  8.3*  --   --   MG 1.5  --  1.4*  --  1.4*  --   --   --  1.4*  --  1.7  --   --   < > = values in this interval not displayed. Liver Function Tests:  Recent Labs Lab 12/06/12 0601 12/07/12 0739 12/08/12 0542 12/09/12 0840 12/10/12 0525  AST 54* 62* 69* 64* 60*  ALT 23 25 28 25 26   ALKPHOS 211* 217* 205* 178* 196*  BILITOT 2.5* 2.6* 2.7* 2.6* 2.0*  PROT 6.2 6.5 6.6 6.1 6.2  ALBUMIN 1.9* 2.2* 2.2* 2.0* 2.1*   No results found for this basename: LIPASE, AMYLASE,  in the last 168  hours  Recent Labs Lab 12/05/12 0550 12/05/12 1130 12/07/12 1604  AMMONIA 164* 93* 128*   CBC:  Recent Labs Lab 12/06/12 0601 12/07/12 0739 12/08/12 0542 12/09/12 0840 12/10/12 0525  WBC 3.9* 3.9* 4.1 3.5* 3.4*  NEUTROABS 2.4 2.2 2.6 2.2 2.0  HGB 9.4* 10.1* 9.6* 8.8* 8.5*  HCT 26.5* 27.9* 26.6* 24.7* 24.4*  MCV 96.7 95.9 96.0 98.4 99.2  PLT 41* 43* 39* 40* 34*   Cardiac Enzymes: No results found for this basename: CKTOTAL, CKMB, CKMBINDEX, TROPONINI,  in the last 168 hours BNP (last 3 results)  Recent Labs  12/03/12 1435  PROBNP 297.7*   CBG:  Recent Labs Lab 12/09/12 1625 12/09/12 2128 12/10/12 0725 12/10/12 1126 12/10/12 1634  GLUCAP 126* 186* 107* 140* 164*    No results found for this or any previous visit (from the past 240 hour(s)).   Studies: Ir Fluoro Guide Cv Line Left  12/09/2012   *RADIOLOGY REPORT*  Clinical Data: Chronic liver cirrhosis, poor venous access.  POWERED PICC LINE PLACEMENT WITH ULTRASOUND AND FLUOROSCOPIC GUIDANCE  Fluoroscopy Time: 0 minutes, 12 seconds  The right arm was prepped with chlorhexidine, draped in the usual sterile fashion using maximum barrier technique (cap and mask, sterile gown, sterile gloves, large sterile sheet, hand hygiene and cutaneous antisepsis) and infiltrated locally with 1% Lidocaine.  Ultrasound demonstrated patency of the right brachial vein, and this was documented with an image.  Under real-time ultrasound guidance, this vein was accessed with a 21 gauge micropuncture needle and image documentation was performed.  The needle was exchanged over a guidewire for a peel-away sheath through which a five [place] dual  lumen PICC trimmed to 41 cm was advanced, positioned with its tip at the lower SVC/right atrial junction. Fluoroscopy during the procedure and fluoro spot radiograph confirms appropriate catheter position.  The catheter was flushed, secured to the skin with Prolene sutures, and covered with a sterile  dressing.  Complications:  None  IMPRESSION: Successful right arm poweredPICC line placement with ultrasound and fluoroscopic guidance.  The catheter is ready for use.  Read by: Brayton El, P.A,-C   Original Report Authenticated By: Tacey Ruiz, MD   Ir US Guide Vasc Access Right  12/09/2012   *RADIOLOGY REPORT*  Clinical Data: Chronic liver cirrhosis, poor venous access.  POWERED PICC LINE PLACEMENT WITH ULTRASOUND AND FLUOROSCOPIC GUIDANCE  Fluoroscopy Time: 0 minutes, 12 seconds  The right arm was prepped with chlorhexidine, draped in the usual sterile fashion using maximum barrier technique (cap and mask, sterile gown, sterile gloves, large sterile sheet, hand hygiene and cutaneous antisepsis) and infiltrated locally with 1% Lidocaine.  Ultrasound demonstrated patency of the right brachial vein, and this was documented with an image.  Under real-time ultrasound guidance, this vein was accessed with a 21 gauge micropuncture needle and image documentation was performed.  The needle was exchanged over a guidewire for a peel-away sheath through which a five [place] dual lumen PICC trimmed to 41 cm was advanced, positioned with its tip at the lower SVC/right atrial junction. Fluoroscopy during the procedure and fluoro spot radiograph confirms appropriate catheter position.  The catheter was flushed, secured to the skin with Prolene sutures, and covered with a sterile dressing.  Complications:  None  IMPRESSION: Successful right arm poweredPICC line placement with ultrasound and fluoroscopic guidance.  The catheter is ready for use.  Read by: Brayton El, P.A,-C   Original Report Authenticated By: Tacey Ruiz, MD    Scheduled Meds: . ferrous sulfate  325 mg Oral QHS  . folic acid  1 mg Oral Daily  . furosemide  40 mg Oral BID  . insulin aspart  0-20 Units Subcutaneous TID WC  . insulin aspart  0-5 Units Subcutaneous QHS  . insulin aspart  6 Units Subcutaneous TID WC  . insulin glargine  20 Units  Subcutaneous QHS  . lactulose  30 g Oral 5 X Daily  . levETIRAcetam  500 mg Oral BID  . levothyroxine  75 mcg Oral QAC breakfast  . nadolol  40 mg Oral Daily  . pantoprazole  40 mg Oral Daily  . PARoxetine  20 mg Oral Daily  . rifaximin  550 mg Oral BID  . risperiDONE  0.25 mg Oral BID  . sodium chloride  10-40 mL Intracatheter Q12H  . sodium chloride  3 mL Intravenous Q12H  . spironolactone  100 mg Oral Daily   Continuous Infusions: . sodium chloride      Principal Problem:   Dyspnea, rule out CHF in a patient with elevated pro-BNP, anasarca Active Problems:   Hepatic encephalopathy   Hyponatremia   Cirrhosis   Esophageal varices without mention of bleeding   Seizure disorder   Thrombocytopenia, acquired   Hypothyroidism   Diabetes mellitus type 2, uncontrolled, with complications   Anasarca   Hypokalemia   Normocytic anemia   H/O bipolar disorder   Hypomagnesemia   PTSD (post-traumatic stress disorder)   Hyperkalemia    Time spent: 35 minutes    WOODS, CURTIS, J  [facility] Pager [phone]. If 7PM-7AM, please contact night-coverage at [email or web address], password [facility] 12/10/2012, 9:08 PM  LOS:  7 days

## 2012-12-11 LAB — COMPREHENSIVE METABOLIC PANEL
Albumin: 2.2 g/dL — ABNORMAL LOW (ref 3.5–5.2)
BUN: 10 mg/dL (ref 6–23)
Creatinine, Ser: 0.7 mg/dL (ref 0.50–1.35)
GFR calc Af Amer: >90 mL/min (ref >90)
Potassium: 3.4 mEq/L — ABNORMAL LOW (ref 3.5–5.1)
Total Protein: 6.2 g/dL (ref 6.0–8.3)

## 2012-12-11 LAB — CBC WITH DIFFERENTIAL/PLATELET
Basophils Absolute: 0 10*3/uL (ref 0.0–0.1)
Basophils Relative: 0 % (ref 0–1)
HCT: 24 % — ABNORMAL LOW (ref 39.0–52.0)
Hemoglobin: 8.3 g/dL — ABNORMAL LOW (ref 13.0–17.0)
Lymphocytes Relative: 22 % (ref 12–46)
Lymphs Abs: 0.9 10*3/uL (ref 0.7–4.0)
Monocytes Absolute: 0.4 10*3/uL (ref 0.1–1.0)
Monocytes Relative: 9 % (ref 3–12)
Neutro Abs: 2.4 10*3/uL (ref 1.7–7.7)
Neutrophils Relative %: 61 % (ref 43–77)
Platelets: 36 10*3/uL — ABNORMAL LOW (ref 150–400)
WBC: 3.9 10*3/uL — ABNORMAL LOW (ref 4.0–10.5)

## 2012-12-11 LAB — MAGNESIUM: Magnesium: 1.5 mg/dL (ref 1.5–2.5)

## 2012-12-11 LAB — GLUCOSE, CAPILLARY: Glucose-Capillary: 94 mg/dL (ref 70–99)

## 2012-12-11 MED ORDER — ALUM & MAG HYDROXIDE-SIMETH 200-200-20 MG/5ML PO SUSP: 30.0000 mL | Freq: Four times a day (QID) | ORAL | Status: DC | PRN

## 2012-12-11 MED ORDER — NADOLOL 40 MG PO TABS: 40.0000 mg | ORAL_TABLET | Freq: Every day | ORAL | Status: DC

## 2012-12-11 MED ORDER — PAROXETINE HCL 20 MG PO TABS: 20.0000 mg | ORAL_TABLET | Freq: Every day | ORAL | Status: DC

## 2012-12-11 MED ORDER — POTASSIUM CHLORIDE CRYS ER 20 MEQ PO TBCR
20.0000 meq | EXTENDED_RELEASE_TABLET | Freq: Every day | ORAL | Status: DC
Start: 2012-12-11 — End: 2012-12-11
  Administered 2012-12-11: 20 meq via ORAL
  Filled 2012-12-11: qty 1

## 2012-12-11 MED ORDER — POTASSIUM CHLORIDE CRYS ER 20 MEQ PO TBCR: 20.0000 meq | EXTENDED_RELEASE_TABLET | Freq: Every day | ORAL | Status: DC

## 2012-12-11 MED ORDER — ONDANSETRON HCL 4 MG PO TABS: 4.0000 mg | ORAL_TABLET | Freq: Four times a day (QID) | ORAL | Status: DC | PRN

## 2012-12-11 MED ORDER — HEPARIN SOD (PORK) LOCK FLUSH 100 UNIT/ML IV SOLN
500.0000 [IU] | INTRAVENOUS | Status: DC | PRN
  Administered 2012-12-11: 500 [IU]

## 2012-12-11 MED ORDER — FUROSEMIDE 20 MG PO TABS: 60.0000 mg | ORAL_TABLET | Freq: Two times a day (BID) | ORAL | Status: DC

## 2012-12-11 MED ORDER — ALBUTEROL SULFATE (5 MG/ML) 0.5% IN NEBU: 2.5000 mg | INHALATION_SOLUTION | RESPIRATORY_TRACT | Status: DC | PRN

## 2012-12-11 MED ORDER — IPRATROPIUM BROMIDE 0.02 % IN SOLN: 0.5000 mg | RESPIRATORY_TRACT | Status: DC | PRN

## 2012-12-11 NOTE — Progress Notes (Signed)
Physical Therapy Treatment Patient Details Name: Tony Boyd MRN: [id number] DOB: 01-26-1956 Today's Date: 12/11/2012 Time: 4098-1191 PT Time Calculation (min): 13 min  PT Assessment / Plan / Recommendation  History of Present Illness 57 yo male admitted with hepatic encephalopathy. Hx of Hep C, Sz, cirrhosis. From [facility] living   PT Comments   Pt progressing   Follow Up Recommendations  Home health PT;No PT follow up     Does the patient have the potential to tolerate intense rehabilitation     Barriers to Discharge        Equipment Recommendations  None recommended by PT    Recommendations for Other Services    Frequency Min 3X/week   Progress towards PT Goals Progress towards PT goals: Progressing toward goals  Plan Current plan remains appropriate    Precautions / Restrictions Precautions Precautions: Fall Precaution Comments: strict in/out   Pertinent Vitals/Pain Denies pain    Mobility  Bed Mobility Bed Mobility: Supine to Sit Supine to Sit: 6: Modified independent (Device/Increase time) Sit to Supine: 6: Modified independent (Device/Increase time) Details for Bed Mobility Assistance: increased time Transfers Transfers: Sit to Stand;Stand to Sit Sit to Stand: 7: Independent Stand to Sit: 7: Independent Details for Transfer Assistance: pt demos Ambulation/Gait Ambulation/Gait Assistance: 6: Modified independent (Device/Increase time) Ambulation Distance (Feet): 280 Feet Assistive device: Rolling walker Gait Pattern: Step-through pattern;Decreased stride length;Decreased trunk rotation;Wide base of support;Decreased hip/knee flexion - right;Decreased hip/knee flexion - left    Exercises General Exercises - Lower Extremity Ankle Circles/Pumps: AROM;Both;15 reps;Seated   PT Diagnosis:    PT Problem List:   PT Treatment Interventions:     PT Goals (current goals can now be found in the care plan section) Acute Rehab PT  Goals Patient Stated Goal: home Time For Goal Achievement: 12/12/12 Potential to Achieve Goals: Good  Visit Information  Last PT Received On: 12/11/12 Assistance Needed: +1 History of Present Illness: 57 yo male admitted with hepatic encephalopathy. Hx of Hep C, Sz, cirrhosis. From [facility]    Subjective Data  Patient Stated Goal: home   Cognition  Cognition Arousal/Alertness: Awake/alert Behavior During Therapy: WFL for tasks assessed/performed Overall Cognitive Status: Within Functional Limits for tasks assessed    Balance     End of Session PT - End of Session Equipment Utilized During Treatment: Gait belt Activity Tolerance: Patient tolerated treatment well Patient left: with call bell/phone within reach;in chair   GP     [facility] 12/11/2012, 1:23 PM

## 2012-12-11 NOTE — Progress Notes (Signed)
Nutrition Brief Note  Patient identified on the Malnutrition Screening Tool (MST) Report  Wt Readings from Last 15 Encounters:  12/11/12 258 lb 11.2 oz (117.346 kg)  11/12/12 281 lb 12 oz (127.8 kg)  10/14/12 310 lb 3 oz (140.7 kg)  10/08/12 325 lb 2.9 oz (147.5 kg)  10/02/12 302 lb (136.986 kg)  09/25/12 273 lb (123.832 kg)  09/21/12 252 lb 6.8 oz (114.5 kg)  08/28/12 245 lb 2.4 oz (111.2 kg)  08/15/12 274 lb (124.286 kg)  08/11/12 271 lb 6.2 oz (123.1 kg)  07/31/12 278 lb (126.1 kg)  07/25/12 260 lb (117.935 kg)  06/09/12 275 lb 12.8 oz (125.102 kg)  06/02/12 260 lb 2.3 oz (118 kg)  05/23/12 267 lb 11.2 oz (121.428 kg)    Body mass index is 39.34 kg/(m^2). Patient meets criteria for class II obesity based on current BMI.   Current diet order is regular, patient is consuming approximately 100% of meals at this time. Labs and medications reviewed. Pt with low sodium and potassium and elevated Alk phos, AST, and total bilirubin. Pt with history of cirrhosis/ESLD secondary to alcohol abuse and hepatitis C. Met with pt who reports eating 1 meal/day and drinking 1 Ensure/day PTA and weights have been fluctuating r/t anasarca. Pt aware of importance of following low sodium diet at home and has been eating excellent during admission.   No nutrition interventions warranted at this time. If nutrition issues arise, please consult RD.   Levon Hedger MS, RD, LDN [phone] Pager [phone] After Hours Pager

## 2012-12-12 LAB — HCV RNA QUANT
HCV Quantitative Log: 4.54 {Log} — ABNORMAL HIGH (ref <1.18)
HCV Quantitative: 34479 IU/mL — ABNORMAL HIGH (ref <15)

## 2012-12-12 LAB — GLUCOSE, CAPILLARY: Glucose-Capillary: 157 mg/dL — ABNORMAL HIGH (ref 70–99)

## 2012-12-12 NOTE — Progress Notes (Signed)
CARE MANAGEMENT NOTE 12/12/2012  Patient:  Tony Boyd, Tony Boyd   Account Number:  [id number]  Date Initiated:  12/04/2012  Documentation initiated by:  [facility]  Subjective/Objective Assessment:   57 year old male with hx ESLD admitted with SOB.     Action/Plan:   Lives at home. Was active with [facility] for HHRN/PT prior to admission to hospital.   Anticipated DC Date:  12/11/2012   Anticipated DC Plan:  HOME W HOME HEALTH SERVICES    DC Planning Services  CM consult      [facility] Choice  Resumption Of Svcs/PTA Provider   Choice offered to / List presented to:  [name]       HH arranged  HH-1 RN  HH-2 PT      [facility] agency  [facility]   Status of service:  In process, will continue to follow  Medicare Important Message given?  NA - LOS <3 / Initial given by admissions   Discharge Disposition:  HOME W HOME HEALTH SERVICES  Per UR Regulation:  Reviewed for med. necessity/level of care/duration of stay

## 2012-12-16 ENCOUNTER — Other Ambulatory Visit: Payer: Self-pay | Admitting: Internal Medicine

## 2012-12-23 ENCOUNTER — Telehealth: Payer: Self-pay | Admitting: *Deleted

## 2012-12-23 NOTE — Telephone Encounter (Signed)
[name] called and stated that her father needs to be seen but cant because he has [place] [employer]. She stated that she spoke with Nicole Cella and that she wouldn't give her an appointment. Told her that she needed to get the insurance changed and in the mean time to call the Dr. On the card or take to ER or Urgent care to be evaluated. She stated that she had already spoken with the case manager and it is suppose to be changing. Nicole Cella said that she told her on 10/02/2012 to change the card. She stated that she would call the case worker.  [facility] [place] # [phone] called and stated that the patient needed an order signed to flush patient's PICC line. Told her the same as I told the daughter. Cythina also spoke with her.

## 2012-12-25 ENCOUNTER — Telehealth: Payer: Self-pay

## 2012-12-25 NOTE — Telephone Encounter (Signed)
Message left on triage VM: Patient needs a refill (no additional information was given)  I called patient back at given number- unable to connect with patient, I will try again later. Reason for call was to see what rx patient needs filled.

## 2012-12-26 ENCOUNTER — Other Ambulatory Visit: Payer: Self-pay

## 2012-12-26 MED ORDER — OXYCODONE HCL 10 MG PO TABS: 10.0000 mg | ORAL_TABLET | ORAL | Status: DC

## 2012-12-27 ENCOUNTER — Emergency Department (HOSPITAL_COMMUNITY): Payer: Medicare Other

## 2012-12-27 ENCOUNTER — Emergency Department (HOSPITAL_COMMUNITY)
Admission: EM | Admit: 2012-12-27 | Discharge: 2012-12-27 | Disposition: A | Discharge status: Home / Self Care | Payer: Medicare Other | Source: Home / Self Care | Attending: Emergency Medicine | Admitting: Emergency Medicine

## 2012-12-27 ENCOUNTER — Other Ambulatory Visit: Payer: Self-pay | Admitting: Internal Medicine

## 2012-12-27 ENCOUNTER — Encounter (HOSPITAL_COMMUNITY): Payer: Self-pay | Admitting: Emergency Medicine

## 2012-12-27 DIAGNOSIS — Z880 Allergy status to penicillin: Secondary | ICD-10-CM | POA: Insufficient documentation

## 2012-12-27 DIAGNOSIS — Z794 Long term (current) use of insulin: Secondary | ICD-10-CM | POA: Insufficient documentation

## 2012-12-27 DIAGNOSIS — R232 Flushing: Secondary | ICD-10-CM | POA: Insufficient documentation

## 2012-12-27 DIAGNOSIS — Z79899 Other long term (current) drug therapy: Secondary | ICD-10-CM | POA: Insufficient documentation

## 2012-12-27 DIAGNOSIS — Z872 Personal history of diseases of the skin and subcutaneous tissue: Secondary | ICD-10-CM | POA: Insufficient documentation

## 2012-12-27 DIAGNOSIS — N189 Chronic kidney disease, unspecified: Secondary | ICD-10-CM | POA: Insufficient documentation

## 2012-12-27 DIAGNOSIS — F1021 Alcohol dependence, in remission: Secondary | ICD-10-CM | POA: Insufficient documentation

## 2012-12-27 DIAGNOSIS — Z87891 Personal history of nicotine dependence: Secondary | ICD-10-CM | POA: Insufficient documentation

## 2012-12-27 DIAGNOSIS — K219 Gastro-esophageal reflux disease without esophagitis: Secondary | ICD-10-CM | POA: Insufficient documentation

## 2012-12-27 DIAGNOSIS — F319 Bipolar disorder, unspecified: Secondary | ICD-10-CM | POA: Insufficient documentation

## 2012-12-27 DIAGNOSIS — E039 Hypothyroidism, unspecified: Secondary | ICD-10-CM | POA: Insufficient documentation

## 2012-12-27 DIAGNOSIS — G40909 Epilepsy, unspecified, not intractable, without status epilepticus: Secondary | ICD-10-CM | POA: Insufficient documentation

## 2012-12-27 DIAGNOSIS — D649 Anemia, unspecified: Secondary | ICD-10-CM | POA: Insufficient documentation

## 2012-12-27 DIAGNOSIS — Z8679 Personal history of other diseases of the circulatory system: Secondary | ICD-10-CM | POA: Insufficient documentation

## 2012-12-27 DIAGNOSIS — Z452 Encounter for adjustment and management of vascular access device: Secondary | ICD-10-CM | POA: Insufficient documentation

## 2012-12-27 DIAGNOSIS — E669 Obesity, unspecified: Secondary | ICD-10-CM | POA: Insufficient documentation

## 2012-12-27 DIAGNOSIS — Z8619 Personal history of other infectious and parasitic diseases: Secondary | ICD-10-CM | POA: Insufficient documentation

## 2012-12-27 DIAGNOSIS — E1149 Type 2 diabetes mellitus with other diabetic neurological complication: Secondary | ICD-10-CM | POA: Insufficient documentation

## 2012-12-27 DIAGNOSIS — Z8709 Personal history of other diseases of the respiratory system: Secondary | ICD-10-CM | POA: Insufficient documentation

## 2012-12-27 DIAGNOSIS — M545 Low back pain, unspecified: Secondary | ICD-10-CM | POA: Insufficient documentation

## 2012-12-27 DIAGNOSIS — G589 Mononeuropathy, unspecified: Secondary | ICD-10-CM | POA: Insufficient documentation

## 2012-12-27 DIAGNOSIS — R109 Unspecified abdominal pain: Secondary | ICD-10-CM | POA: Insufficient documentation

## 2012-12-27 DIAGNOSIS — Z792 Long term (current) use of antibiotics: Secondary | ICD-10-CM | POA: Insufficient documentation

## 2012-12-27 LAB — CBC WITH DIFFERENTIAL/PLATELET
Basophils Absolute: 0 10*3/uL (ref 0.0–0.1)
Basophils Relative: 0 % (ref 0–1)
Eosinophils Relative: 4 % (ref 0–5)
HCT: 29.4 % — ABNORMAL LOW (ref 39.0–52.0)
Hemoglobin: 10.7 g/dL — ABNORMAL LOW (ref 13.0–17.0)
Lymphs Abs: 0.6 10*3/uL — ABNORMAL LOW (ref 0.7–4.0)
MCHC: 36.4 g/dL — ABNORMAL HIGH (ref 30.0–36.0)
MCV: 98.3 fL (ref 78.0–100.0)
Monocytes Absolute: 0.4 10*3/uL (ref 0.1–1.0)
Monocytes Relative: 10 % (ref 3–12)
Neutro Abs: 3.4 10*3/uL (ref 1.7–7.7)
Platelets: 36 10*3/uL — ABNORMAL LOW (ref 150–400)
RDW: 16.3 % — ABNORMAL HIGH (ref 11.5–15.5)

## 2012-12-27 LAB — COMPREHENSIVE METABOLIC PANEL
AST: 51 U/L — ABNORMAL HIGH (ref 0–37)
Albumin: 2.2 g/dL — ABNORMAL LOW (ref 3.5–5.2)
Alkaline Phosphatase: 155 U/L — ABNORMAL HIGH (ref 39–117)
BUN: 18 mg/dL (ref 6–23)
CO2: 33 mEq/L — ABNORMAL HIGH (ref 19–32)
Calcium: 8.3 mg/dL — ABNORMAL LOW (ref 8.4–10.5)
Chloride: 86 mEq/L — ABNORMAL LOW (ref 96–112)
Creatinine, Ser: 0.8 mg/dL (ref 0.50–1.35)
GFR calc non Af Amer: >90 mL/min (ref >90)
Potassium: 2.8 mEq/L — ABNORMAL LOW (ref 3.5–5.1)
Sodium: 124 mEq/L — ABNORMAL LOW (ref 135–145)
Total Bilirubin: 3.1 mg/dL — ABNORMAL HIGH (ref 0.3–1.2)

## 2012-12-27 LAB — URINALYSIS, ROUTINE W REFLEX MICROSCOPIC
Bilirubin Urine: NEGATIVE
Hgb urine dipstick: NEGATIVE
Ketones, ur: NEGATIVE mg/dL
Specific Gravity, Urine: 1.011 (ref 1.005–1.030)
pH: 7.5 (ref 5.0–8.0)

## 2012-12-27 MED ORDER — SODIUM CHLORIDE 0.9 % IV SOLN
Freq: Once | INTRAVENOUS | Status: AC
  Administered 2012-12-27: 17:00:00 via INTRAVENOUS

## 2012-12-27 MED ORDER — POTASSIUM CHLORIDE CRYS ER 20 MEQ PO TBCR
40.0000 meq | EXTENDED_RELEASE_TABLET | Freq: Once | ORAL | Status: AC
  Administered 2012-12-27: 40 meq via ORAL
  Filled 2012-12-27: qty 2

## 2012-12-27 NOTE — ED Notes (Signed)
Bed: WA20 Expected date: 12/27/12 Expected time: 3:49 PM Means of arrival: Ambulance Comments: PICC line care

## 2012-12-27 NOTE — ED Notes (Signed)
Pt states his flank/back pain has been going on for 40 years - states it is intermittent, has pain with urination but this is not a new problem.

## 2012-12-27 NOTE — ED Notes (Signed)
Pt states he came to ED to evaluate if PICC line was infected, does not want PICC removed.  PICC dressing changed, site cleansed, antimicrobial disc in place, caps changed all using sterile technique.

## 2012-12-27 NOTE — ED Notes (Signed)
Per ems: pt from home, had picc line placed two weeks ago while admitted to hospital. Pt's daughter called ems because they want line "cleaned and possibly removed". Pt was told it was placed because he is a hard stick, pt worried it will get infected.  Pt also c/o left flank/lower back pain and painful urination. Hx of kidney stones.

## 2012-12-27 NOTE — ED Provider Notes (Signed)
Medical screening examination/treatment/procedure(s) were conducted as a shared visit with non-physician practitioner(s) and myself.  I personally evaluated the patient during the encounter.  57 yo male with multiple medical problems presenting secondary to concerns about his PICC line.  "My daughter came to pick me up and said 'we're going to the hospital'."  Currently denies complaints, stating he feels fine.  Exam shows well appearing, NAD, normal heart sounds, lungs CTAB, abd soft, nttp.  PICC line in place in RUE and is well appearing.  Labwork consistent with prior with exception of worsening hypokalemia, repleted orally.  Plan further outpatient management.   Clinical Impression: 1. PICC (peripherally inserted central catheter) flush       Candyce Churn, MD 12/28/12 [phone]

## 2012-12-27 NOTE — ED Provider Notes (Signed)
CSN: [id number]     Arrival date & time 12/27/12  1552 History   First MD Initiated Contact with Patient 12/27/12 1605     Chief Complaint  Patient presents with  . picc line removal?   . Flank Pain   (Consider location/radiation/quality/duration/timing/severity/associated sxs/prior Treatment) Patient is a 57 y.o. male presenting with back pain. The history is provided by the patient. No language interpreter was used.  Back Pain Location:  Lumbar spine Quality:  Aching Radiates to:  Does not radiate Associated symptoms: no abdominal pain, no bladder incontinence, no bowel incontinence, no dysuria, no fever, no leg pain, no numbness, no paresthesias, no perianal numbness and no tingling     pt is a 31 chronically ill male who presents today with back pain. He reports that this back pain is at the level of his waist and feels like spasms to the left of his spine. He says that this feel like his typical back pain and that his regular pain medicine has not been effective. He is also here because he wants his PICC line flushed and "checked out", he reports that is has been in since his last hospital admission and he wants to make sure that it is ok to leave in. He reports that he is supposed to follow-up with his regular doctor on Monday or Tuesday.He denies fever, chills, cough, difficulty breathing or recent sick exposure. He reports that he has been moving his bowels as normal and has been urinating without pain or hematuria. He does report that he has a little hesitancy and difficulty starting his urine stream.     Past Medical History  Diagnosis Date  . Cirrhosis of liver   . Hepatitis C   . ETOH abuse   . Hypothyroid   . Psychosis   . Bipolar disorder   . Seizures   . Arthritis   . Back pain   . Coagulopathy     Hx of  . Thrombocytopenia     Hx of  . Pancytopenia     Hx of  . H/O hypokalemia   . Hyponatremia     Hx of  . Korsakoff psychosis   . H/O abdominal abscess   . H/O  esophageal varices   . Metabolic encephalopathy   . Hepatic encephalopathy   . Urinary urgency   . H/O renal failure   . H/O ascites   . Altered mental status   . Anemia   . Ascites   . Shortness of breath   . Peripheral vascular disease   . GERD (gastroesophageal reflux disease)   . Thrombocytopenia, acquired 06/02/2012  . Unspecified hypothyroidism 06/02/2012  . Pain in joint, pelvic region and thigh   . Edema   . Obesity, unspecified   . Chest pain, unspecified   . Pneumonitis due to inhalation of food or vomitus   . Other convulsions   . Cellulitis and abscess of upper arm and forearm   . Chronic hepatitis C without mention of hepatic coma   . Hypopotassemia   . Anemia, unspecified   . Other and unspecified coagulation defects   . Thrombocytopenia, unspecified   . Bipolar I disorder, most recent episode (or current) unspecified   . Unspecified psychosis   . Encephalopathy   . Esophageal varices without mention of bleeding   . Esophagitis, unspecified   . Abscess of liver(572.0)   . Chronic kidney disease, unspecified   . Lumbago   . Personal history of alcoholism   .  Personal history of tobacco use, presenting hazards to health   . Cirrhosis of liver without mention of alcohol   . Pneumonitis due to inhalation of food or vomitus 09/19/2010  . Chronic hepatitis C without mention of hepatic coma   . Bipolar I disorder, most recent episode (or current) unspecified   . Unspecified psychosis   . Other ascites   . Personal history of tobacco use, presenting hazards to health   . Cirrhosis of liver without mention of alcohol   . Type 2 diabetes mellitus with diabetic neuropathy    Past Surgical History  Procedure Laterality Date  . Colostomy    . Cholecystectomy    . Small intestine surgery    . Arm surgery      left arm  . US guided drain placement in ventral hernia abscess  07/21/2010  . Multiple picc line placements    . Esophagogastroduodenoscopy  06/28/2011     Procedure: ESOPHAGOGASTRODUODENOSCOPY (EGD);  Surgeon: Louis Meckel, MD;  Location: [facility];  Service: Endoscopy;  Laterality: N/A;  . Esophagogastroduodenoscopy N/A 05/06/2012    Procedure: ESOPHAGOGASTRODUODENOSCOPY (EGD);  Surgeon: Louis Meckel, MD;  Location: [facility];  Service: Endoscopy;  Laterality: N/A;  . Esophageal banding N/A 05/06/2012    Procedure: ESOPHAGEAL BANDING;  Surgeon: Louis Meckel, MD;  Location: [facility];  Service: Endoscopy;  Laterality: N/A;   Family History  Problem Relation Age of Onset  . Heart disease Mother     MI  . Lung cancer Brother    History  Substance Use Topics  . Smoking status: Former Smoker    Types: Cigarettes    Quit date: 02/15/2008  . Smokeless tobacco: Never Used  . Alcohol Use: No     Comment: Quit drinking 1 year ago.      Review of Systems  Constitutional: Negative for fever.  Gastrointestinal: Negative for nausea, vomiting, abdominal pain, diarrhea and bowel incontinence.  Genitourinary: Negative for bladder incontinence, dysuria and hematuria.  Musculoskeletal: Positive for back pain.  Skin: Negative for rash.  Neurological: Negative for tingling, numbness and paresthesias.  All other systems reviewed and are negative.    Allergies  Ativan; Droperidol; Ketorolac; Penicillins; and Toradol  Home Medications   Current Outpatient Rx  Name  Route  Sig  Dispense  Refill  . albuterol (PROVENTIL) (5 MG/ML) 0.5% nebulizer solution   Nebulization   Take 0.5 mLs (2.5 mg total) by nebulization every 4 (four) hours as needed for wheezing or shortness of breath.   20 mL   12   . alum & mag hydroxide-simeth (MAALOX/MYLANTA) 200-200-20 MG/5ML suspension   Oral   Take 30 mLs by mouth every 6 (six) hours as needed.   355 mL   0   . ferrous sulfate 325 (65 FE) MG tablet   Oral   Take 325 mg by mouth at bedtime.         . folic acid (FOLVITE) 1 MG tablet   Oral   Take 1 mg by mouth daily.         .  furosemide (LASIX) 20 MG tablet   Oral   Take 3 tablets (60 mg total) by mouth 2 (two) times daily.   30 tablet   0   . furosemide (LASIX) 40 MG tablet      TAKE 1 & 1/2 TABLETS BY MOUTH TWICE A DAY   90 tablet   0   . insulin aspart (NOVOLOG) 100 UNIT/ML injection  Subcutaneous   Inject 6 Units into the skin 3 (three) times daily with meals.         . insulin glargine (LANTUS) 100 UNIT/ML injection   Subcutaneous   Inject 20 Units into the skin at bedtime.         [name] ipratropium (ATROVENT) 0.02 % nebulizer solution   Nebulization   Take 2.5 mLs (0.5 mg total) by nebulization every 4 (four) hours as needed.   75 mL   12   . KLOR-CON M20 20 MEQ tablet      TAKE 1 TABLET BY MOUTH TWICE A DAY   60 tablet   0   . lactulose (CHRONULAC) 10 GM/15ML solution   Oral   Take 45 mLs (30 g total) by mouth 5 (five) times daily.   1892 mL   11   . levETIRAcetam (KEPPRA) 500 MG tablet      TAKE 1 TABLET BY MOUTH EVERY 12 HOURS   60 tablet   0   . levothyroxine (SYNTHROID, LEVOTHROID) 75 MCG tablet   Oral   Take 75 mcg by mouth daily.         . metolazone (ZAROXOLYN) 5 MG tablet      TAKE 1 TABLET BY MOUTH 30 MINUTES BEFORE TAKING FUROSEMIDE TWICE A DAY   60 tablet   0   . nadolol (CORGARD) 40 MG tablet   Oral   Take 1 tablet (40 mg total) by mouth daily.   30 tablet   0   . omeprazole (PRILOSEC) 20 MG capsule   Oral   Take 20 mg by mouth daily.         . ondansetron (ZOFRAN) 4 MG tablet   Oral   Take 1 tablet (4 mg total) by mouth every 6 (six) hours as needed for nausea.   20 tablet   0   . Oxycodone HCl 10 MG TABS   Oral   Take 1 tablet (10 mg total) by mouth every 4 (four) hours.   180 tablet   0   . PARoxetine (PAXIL) 20 MG tablet   Oral   Take 1 tablet (20 mg total) by mouth daily.   30 tablet   0   . potassium chloride SA (K-DUR,KLOR-CON) 20 MEQ tablet   Oral   Take 1 tablet (20 mEq total) by mouth daily.   30 tablet   0   .  rifaximin (XIFAXAN) 550 MG TABS   Oral   Take 550 mg by mouth 2 (two) times daily.         . risperiDONE (RISPERDAL) 0.25 MG tablet   Oral   Take 0.25 mg by mouth 2 (two) times daily.         [name] spironolactone (ALDACTONE) 100 MG tablet   Oral   Take 100 mg by mouth daily.          BP 114/69  Pulse 64  Temp(Src) 97.7 F (36.5 C) (Oral)  SpO2 95% Physical Exam  Nursing note and vitals reviewed. Constitutional: He is oriented to person, place, and time. He appears well-developed and well-nourished. No distress.  HENT:  Head: Normocephalic and atraumatic.  Mouth/Throat: Oropharynx is clear and moist.  Eyes: Conjunctivae and EOM are normal. Pupils are equal, round, and reactive to light.  Neck: Normal range of motion. Neck supple. No JVD present. No tracheal deviation present. No thyromegaly present.  Cardiovascular: Normal rate, regular rhythm, normal heart sounds and intact distal pulses.   Abdominal:  Soft. Bowel sounds are normal. He exhibits no distension. There is no tenderness. There is no CVA tenderness.  Musculoskeletal: Normal range of motion.       Arms: Paravertebral tenderness, no midline tenderness, numbness, tingling or paresthesias.   Lymphadenopathy:    He has no cervical adenopathy.  Neurological: He is alert and oriented to person, place, and time.  Skin: Skin is warm and dry.  Psychiatric: He has a normal mood and affect. His behavior is normal. Judgment and thought content normal.    ED Course  Procedures (including critical care time) Labs Review Labs Reviewed - No data to display Imaging Review No results found.  EKG Interpretation   None       MDM   1. PICC (peripherally inserted central catheter) flush     Back pain that he is having he says is typical of his chronic back pain. Fluids through PICC without difficulty and flushed. K+ 2.8, klorcon given PO while here. Labs otherwise unchanged from his previous. Chest x-ray,  unremarkable. No complaints of pain, shortness of breath, cough, fever or chills. He reports that he feels fine to go home, he just wanted to make sure that his PICC line was ok. He says that he has regular scheduled follow-up with his MD on Monday or Tuesday.     Irish Elders, NP 12/27/12 2042

## 2012-12-28 ENCOUNTER — Inpatient Hospital Stay (HOSPITAL_COMMUNITY)
Admission: EM | Admit: 2012-12-28 | Discharge: 2012-12-31 | DRG: 441 | Disposition: A | Discharge status: Home Health | Payer: Medicare Other | Attending: Internal Medicine | Admitting: Internal Medicine

## 2012-12-28 ENCOUNTER — Encounter (HOSPITAL_COMMUNITY): Payer: Self-pay | Admitting: Emergency Medicine

## 2012-12-28 DIAGNOSIS — K219 Gastro-esophageal reflux disease without esophagitis: Secondary | ICD-10-CM | POA: Diagnosis present

## 2012-12-28 DIAGNOSIS — N5089 Other specified disorders of the male genital organs: Secondary | ICD-10-CM | POA: Diagnosis present

## 2012-12-28 DIAGNOSIS — K7030 Alcoholic cirrhosis of liver without ascites: Secondary | ICD-10-CM | POA: Diagnosis present

## 2012-12-28 DIAGNOSIS — Z87891 Personal history of nicotine dependence: Secondary | ICD-10-CM

## 2012-12-28 DIAGNOSIS — F1010 Alcohol abuse, uncomplicated: Secondary | ICD-10-CM

## 2012-12-28 DIAGNOSIS — B182 Chronic viral hepatitis C: Principal | ICD-10-CM | POA: Diagnosis present

## 2012-12-28 DIAGNOSIS — I8510 Secondary esophageal varices without bleeding: Secondary | ICD-10-CM | POA: Diagnosis present

## 2012-12-28 DIAGNOSIS — I129 Hypertensive chronic kidney disease with stage 1 through stage 4 chronic kidney disease, or unspecified chronic kidney disease: Secondary | ICD-10-CM | POA: Diagnosis present

## 2012-12-28 DIAGNOSIS — K7290 Hepatic failure, unspecified without coma: Secondary | ICD-10-CM

## 2012-12-28 DIAGNOSIS — F319 Bipolar disorder, unspecified: Secondary | ICD-10-CM | POA: Diagnosis present

## 2012-12-28 DIAGNOSIS — I8500 Esophageal varices without bleeding: Secondary | ICD-10-CM | POA: Diagnosis present

## 2012-12-28 DIAGNOSIS — Z515 Encounter for palliative care: Secondary | ICD-10-CM

## 2012-12-28 DIAGNOSIS — D638 Anemia in other chronic diseases classified elsewhere: Secondary | ICD-10-CM

## 2012-12-28 DIAGNOSIS — Z8659 Personal history of other mental and behavioral disorders: Secondary | ICD-10-CM

## 2012-12-28 DIAGNOSIS — D6959 Other secondary thrombocytopenia: Secondary | ICD-10-CM | POA: Diagnosis present

## 2012-12-28 DIAGNOSIS — G40909 Epilepsy, unspecified, not intractable, without status epilepticus: Secondary | ICD-10-CM | POA: Diagnosis present

## 2012-12-28 DIAGNOSIS — Z79899 Other long term (current) drug therapy: Secondary | ICD-10-CM

## 2012-12-28 DIAGNOSIS — E43 Unspecified severe protein-calorie malnutrition: Secondary | ICD-10-CM | POA: Diagnosis present

## 2012-12-28 DIAGNOSIS — E875 Hyperkalemia: Secondary | ICD-10-CM

## 2012-12-28 DIAGNOSIS — I739 Peripheral vascular disease, unspecified: Secondary | ICD-10-CM | POA: Diagnosis present

## 2012-12-28 DIAGNOSIS — E1149 Type 2 diabetes mellitus with other diabetic neurological complication: Secondary | ICD-10-CM | POA: Diagnosis present

## 2012-12-28 DIAGNOSIS — F4310 Post-traumatic stress disorder, unspecified: Secondary | ICD-10-CM

## 2012-12-28 DIAGNOSIS — E8779 Other fluid overload: Secondary | ICD-10-CM | POA: Diagnosis present

## 2012-12-28 DIAGNOSIS — E1142 Type 2 diabetes mellitus with diabetic polyneuropathy: Secondary | ICD-10-CM | POA: Diagnosis present

## 2012-12-28 DIAGNOSIS — E039 Hypothyroidism, unspecified: Secondary | ICD-10-CM | POA: Diagnosis present

## 2012-12-28 DIAGNOSIS — D696 Thrombocytopenia, unspecified: Secondary | ICD-10-CM

## 2012-12-28 DIAGNOSIS — E871 Hypo-osmolality and hyponatremia: Secondary | ICD-10-CM

## 2012-12-28 DIAGNOSIS — R531 Weakness: Secondary | ICD-10-CM

## 2012-12-28 DIAGNOSIS — E1165 Type 2 diabetes mellitus with hyperglycemia: Secondary | ICD-10-CM

## 2012-12-28 DIAGNOSIS — R0600 Dyspnea, unspecified: Secondary | ICD-10-CM

## 2012-12-28 DIAGNOSIS — E876 Hypokalemia: Secondary | ICD-10-CM | POA: Diagnosis present

## 2012-12-28 DIAGNOSIS — D689 Coagulation defect, unspecified: Secondary | ICD-10-CM | POA: Diagnosis present

## 2012-12-28 DIAGNOSIS — I959 Hypotension, unspecified: Secondary | ICD-10-CM | POA: Diagnosis not present

## 2012-12-28 DIAGNOSIS — D509 Iron deficiency anemia, unspecified: Secondary | ICD-10-CM | POA: Diagnosis present

## 2012-12-28 DIAGNOSIS — N189 Chronic kidney disease, unspecified: Secondary | ICD-10-CM | POA: Diagnosis present

## 2012-12-28 DIAGNOSIS — B1920 Unspecified viral hepatitis C without hepatic coma: Secondary | ICD-10-CM

## 2012-12-28 DIAGNOSIS — Z794 Long term (current) use of insulin: Secondary | ICD-10-CM

## 2012-12-28 DIAGNOSIS — G9341 Metabolic encephalopathy: Secondary | ICD-10-CM | POA: Diagnosis present

## 2012-12-28 DIAGNOSIS — K7460 Unspecified cirrhosis of liver: Secondary | ICD-10-CM

## 2012-12-28 DIAGNOSIS — R601 Generalized edema: Secondary | ICD-10-CM | POA: Diagnosis present

## 2012-12-28 DIAGNOSIS — D649 Anemia, unspecified: Secondary | ICD-10-CM

## 2012-12-28 DIAGNOSIS — E440 Moderate protein-calorie malnutrition: Secondary | ICD-10-CM

## 2012-12-28 LAB — URINALYSIS, ROUTINE W REFLEX MICROSCOPIC
Bilirubin Urine: NEGATIVE
Hgb urine dipstick: NEGATIVE
Leukocytes, UA: NEGATIVE
Nitrite: NEGATIVE
Protein, ur: NEGATIVE mg/dL
Urobilinogen, UA: 1 mg/dL (ref 0.0–1.0)

## 2012-12-28 LAB — LIPASE, BLOOD: Lipase: 49 U/L (ref 11–59)

## 2012-12-28 LAB — COMPREHENSIVE METABOLIC PANEL
Alkaline Phosphatase: 167 U/L — ABNORMAL HIGH (ref 39–117)
BUN: 19 mg/dL (ref 6–23)
CO2: 31 mEq/L (ref 19–32)
Creatinine, Ser: 0.99 mg/dL (ref 0.50–1.35)
GFR calc Af Amer: >90 mL/min (ref >90)
Glucose, Bld: 195 mg/dL — ABNORMAL HIGH (ref 70–99)
Potassium: 3.2 mEq/L — ABNORMAL LOW (ref 3.5–5.1)
Total Protein: 6.7 g/dL (ref 6.0–8.3)

## 2012-12-28 LAB — CBC WITH DIFFERENTIAL/PLATELET
Eosinophils Absolute: 0.1 10*3/uL (ref 0.0–0.7)
Eosinophils Relative: 4 % (ref 0–5)
HCT: 30.1 % — ABNORMAL LOW (ref 39.0–52.0)
Hemoglobin: 10.9 g/dL — ABNORMAL LOW (ref 13.0–17.0)
Lymphocytes Relative: 16 % (ref 12–46)
Lymphs Abs: 0.6 10*3/uL — ABNORMAL LOW (ref 0.7–4.0)
MCH: 36 pg — ABNORMAL HIGH (ref 26.0–34.0)
MCHC: 36.2 g/dL — ABNORMAL HIGH (ref 30.0–36.0)
MCV: 99.3 fL (ref 78.0–100.0)
Monocytes Absolute: 0.3 10*3/uL (ref 0.1–1.0)
Monocytes Relative: 7 % (ref 3–12)
Neutrophils Relative %: 74 % (ref 43–77)
RBC: 3.03 MIL/uL — ABNORMAL LOW (ref 4.22–5.81)

## 2012-12-28 LAB — MAGNESIUM: Magnesium: 1.6 mg/dL (ref 1.5–2.5)

## 2012-12-28 LAB — BLOOD GAS, ARTERIAL
Acid-Base Excess: 7.2 mmol/L — ABNORMAL HIGH (ref 0.0–2.0)
Drawn by: 310571
FIO2: 0.21 %
Patient temperature: 98.6
TCO2: 28.8 mmol/L (ref 0–100)
pCO2 arterial: 46.1 mmHg — ABNORMAL HIGH (ref 35.0–45.0)
pO2, Arterial: 77.4 mmHg — ABNORMAL LOW (ref 80.0–100.0)

## 2012-12-28 LAB — AMMONIA: Ammonia: 181 umol/L — ABNORMAL HIGH (ref 11–60)

## 2012-12-28 LAB — TROPONIN I: Troponin I: <0.3 ng/mL (ref <0.30)

## 2012-12-28 MED ORDER — LACTULOSE 10 GM/15ML PO SOLN
30.0000 g | ORAL | Status: DC
  Administered 2012-12-29 – 2012-12-31 (×16): 30 g via ORAL
  Filled 2012-12-28 (×23): qty 45

## 2012-12-28 MED ORDER — ONDANSETRON HCL 4 MG PO TABS: 4.0000 mg | ORAL_TABLET | Freq: Four times a day (QID) | ORAL | Status: DC | PRN

## 2012-12-28 MED ORDER — FOLIC ACID 1 MG PO TABS
1.0000 mg | ORAL_TABLET | Freq: Every day | ORAL | Status: DC
  Administered 2012-12-29 – 2012-12-31 (×3): 1 mg via ORAL
  Filled 2012-12-28 (×3): qty 1

## 2012-12-28 MED ORDER — NADOLOL 40 MG PO TABS
40.0000 mg | ORAL_TABLET | Freq: Every day | ORAL | Status: DC
  Administered 2012-12-29: 40 mg via ORAL
  Filled 2012-12-28: qty 1

## 2012-12-28 MED ORDER — FERROUS SULFATE 325 (65 FE) MG PO TABS
325.0000 mg | ORAL_TABLET | Freq: Every day | ORAL | Status: DC
  Administered 2012-12-28 – 2012-12-30 (×3): 325 mg via ORAL
  Filled 2012-12-28 (×4): qty 1

## 2012-12-28 MED ORDER — PAROXETINE HCL 20 MG PO TABS
20.0000 mg | ORAL_TABLET | Freq: Every day | ORAL | Status: DC
  Administered 2012-12-29 – 2012-12-31 (×3): 20 mg via ORAL
  Filled 2012-12-28 (×3): qty 1

## 2012-12-28 MED ORDER — OXYCODONE HCL 5 MG PO TABS
10.0000 mg | ORAL_TABLET | ORAL | Status: DC
  Administered 2012-12-28 – 2012-12-31 (×18): 10 mg via ORAL
  Filled 2012-12-28 (×18): qty 2

## 2012-12-28 MED ORDER — LEVOTHYROXINE SODIUM 75 MCG PO TABS
75.0000 ug | ORAL_TABLET | Freq: Every day | ORAL | Status: DC
  Administered 2012-12-29 – 2012-12-31 (×3): 75 ug via ORAL
  Filled 2012-12-28 (×5): qty 1

## 2012-12-28 MED ORDER — IPRATROPIUM BROMIDE 0.02 % IN SOLN: 0.5000 mg | RESPIRATORY_TRACT | Status: DC | PRN

## 2012-12-28 MED ORDER — ALUM & MAG HYDROXIDE-SIMETH 200-200-20 MG/5ML PO SUSP: 30.0000 mL | Freq: Four times a day (QID) | ORAL | Status: DC | PRN

## 2012-12-28 MED ORDER — INSULIN ASPART 100 UNIT/ML ~~LOC~~ SOLN
6.0000 [IU] | Freq: Three times a day (TID) | SUBCUTANEOUS | Status: DC
  Administered 2012-12-29 – 2012-12-31 (×8): 6 [IU] via SUBCUTANEOUS

## 2012-12-28 MED ORDER — INSULIN GLARGINE 100 UNIT/ML ~~LOC~~ SOLN
20.0000 [IU] | Freq: Every day | SUBCUTANEOUS | Status: DC
  Administered 2012-12-28 – 2012-12-30 (×3): 20 [IU] via SUBCUTANEOUS
  Filled 2012-12-28 (×4): qty 0.2

## 2012-12-28 MED ORDER — FUROSEMIDE 40 MG PO TABS
60.0000 mg | ORAL_TABLET | Freq: Two times a day (BID) | ORAL | Status: DC
  Administered 2012-12-29 (×2): 60 mg via ORAL
  Filled 2012-12-28 (×3): qty 1

## 2012-12-28 MED ORDER — RIFAXIMIN 550 MG PO TABS
550.0000 mg | ORAL_TABLET | Freq: Two times a day (BID) | ORAL | Status: DC
  Administered 2012-12-28 – 2012-12-31 (×6): 550 mg via ORAL
  Filled 2012-12-28 (×8): qty 1

## 2012-12-28 MED ORDER — ALBUTEROL SULFATE (5 MG/ML) 0.5% IN NEBU: 2.5000 mg | INHALATION_SOLUTION | RESPIRATORY_TRACT | Status: DC | PRN

## 2012-12-28 MED ORDER — LEVETIRACETAM 500 MG PO TABS
500.0000 mg | ORAL_TABLET | Freq: Two times a day (BID) | ORAL | Status: DC
  Administered 2012-12-28 – 2012-12-31 (×6): 500 mg via ORAL
  Filled 2012-12-28 (×7): qty 1

## 2012-12-28 MED ORDER — RISPERIDONE 0.25 MG PO TABS
0.2500 mg | ORAL_TABLET | Freq: Two times a day (BID) | ORAL | Status: DC
  Administered 2012-12-28 – 2012-12-31 (×6): 0.25 mg via ORAL
  Filled 2012-12-28 (×7): qty 1

## 2012-12-28 MED ORDER — SPIRONOLACTONE 100 MG PO TABS
100.0000 mg | ORAL_TABLET | Freq: Every day | ORAL | Status: DC
  Administered 2012-12-29: 100 mg via ORAL
  Filled 2012-12-28: qty 1

## 2012-12-28 MED ORDER — PANTOPRAZOLE SODIUM 40 MG PO TBEC
40.0000 mg | DELAYED_RELEASE_TABLET | Freq: Every day | ORAL | Status: DC
  Administered 2012-12-29 – 2012-12-31 (×3): 40 mg via ORAL
  Filled 2012-12-28 (×3): qty 1

## 2012-12-28 MED ORDER — METOLAZONE 5 MG PO TABS
5.0000 mg | ORAL_TABLET | Freq: Two times a day (BID) | ORAL | Status: DC
  Administered 2012-12-28 – 2012-12-29 (×2): 5 mg via ORAL
  Filled 2012-12-28 (×3): qty 1

## 2012-12-28 MED ORDER — POTASSIUM CHLORIDE CRYS ER 20 MEQ PO TBCR
20.0000 meq | EXTENDED_RELEASE_TABLET | Freq: Two times a day (BID) | ORAL | Status: DC
  Administered 2012-12-28 – 2012-12-29 (×3): 20 meq via ORAL
  Filled 2012-12-28 (×5): qty 1

## 2012-12-28 NOTE — ED Provider Notes (Signed)
CSN: [id number]     Arrival date & time 12/28/12  1456 History   First MD Initiated Contact with Patient 12/28/12 1509     Chief Complaint  Patient presents with  . Dizziness    HPI  Tony Boyd is a 57 y.o. male with a PMH of liver cirrhosis, hepatitis C, alcohol abuse, hypothyroidism, psychosis, bipolar disorder, seizures, arthritis, back pain, pancytopenia, esophageal varies, metabolic & hepatic encephalopathy, renal failure, DM, GERD, and PVD who presents to the ED for dizziness and emesis.  Patient is a poor historian and appears intermittently confused.  Alert and oriented x 3.  When asked why the patient is in the ED he states "I'm sick."  He state she vomited 3 times today.  No hematemesis.  When asked if he has any pain he points to his lower middle chest and epigastric region diffusely.  He states he also has chronic abdominal pain from his liver cirrhosis.  He has had a "little diarrhea" but cannot elaborate on this.  He also has a cough but doesn't know how long.  He denies any known fevers.  He states he is taking his medication.  He denies any alcohol use.  Patient lives at the [facility].     Past Medical History  Diagnosis Date  . Cirrhosis of liver   . Hepatitis C   . ETOH abuse   . Hypothyroid   . Psychosis   . Bipolar disorder   . Seizures   . Arthritis   . Back pain   . Coagulopathy     Hx of  . Thrombocytopenia     Hx of  . Pancytopenia     Hx of  . H/O hypokalemia   . Hyponatremia     Hx of  . Korsakoff psychosis   . H/O abdominal abscess   . H/O esophageal varices   . Metabolic encephalopathy   . Hepatic encephalopathy   . Urinary urgency   . H/O renal failure   . H/O ascites   . Altered mental status   . Anemia   . Ascites   . Shortness of breath   . Peripheral vascular disease   . GERD (gastroesophageal reflux disease)   . Thrombocytopenia, acquired 06/02/2012  . Unspecified hypothyroidism 06/02/2012  . Pain in joint,  pelvic region and thigh   . Edema   . Obesity, unspecified   . Chest pain, unspecified   . Pneumonitis due to inhalation of food or vomitus   . Other convulsions   . Cellulitis and abscess of upper arm and forearm   . Chronic hepatitis C without mention of hepatic coma   . Hypopotassemia   . Anemia, unspecified   . Other and unspecified coagulation defects   . Thrombocytopenia, unspecified   . Bipolar I disorder, most recent episode (or current) unspecified   . Unspecified psychosis   . Encephalopathy   . Esophageal varices without mention of bleeding   . Esophagitis, unspecified   . Abscess of liver(572.0)   . Chronic kidney disease, unspecified   . Lumbago   . Personal history of alcoholism   . Personal history of tobacco use, presenting hazards to health   . Cirrhosis of liver without mention of alcohol   . Pneumonitis due to inhalation of food or vomitus 09/19/2010  . Chronic hepatitis C without mention of hepatic coma   . Bipolar I disorder, most recent episode (or current) unspecified   . Unspecified psychosis   .  Other ascites   . Personal history of tobacco use, presenting hazards to health   . Cirrhosis of liver without mention of alcohol   . Type 2 diabetes mellitus with diabetic neuropathy    Past Surgical History  Procedure Laterality Date  . Colostomy    . Cholecystectomy    . Small intestine surgery    . Arm surgery      left arm  . US guided drain placement in ventral hernia abscess  07/21/2010  . Multiple picc line placements    . Esophagogastroduodenoscopy  06/28/2011    Procedure: ESOPHAGOGASTRODUODENOSCOPY (EGD);  Surgeon: Louis Meckel, MD;  Location: [facility];  Service: Endoscopy;  Laterality: N/A;  . Esophagogastroduodenoscopy N/A 05/06/2012    Procedure: ESOPHAGOGASTRODUODENOSCOPY (EGD);  Surgeon: Louis Meckel, MD;  Location: [facility];  Service: Endoscopy;  Laterality: N/A;  . Esophageal banding N/A 05/06/2012    Procedure: ESOPHAGEAL  BANDING;  Surgeon: Louis Meckel, MD;  Location: [facility];  Service: Endoscopy;  Laterality: N/A;   Family History  Problem Relation Age of Onset  . Heart disease Mother     MI  . Lung cancer Brother    History  Substance Use Topics  . Smoking status: Former Smoker    Types: Cigarettes    Quit date: 02/15/2008  . Smokeless tobacco: Never Used  . Alcohol Use: No     Comment: Quit drinking 1 year ago.      Review of Systems  Constitutional: Negative for fever.  Respiratory: Positive for cough.   Cardiovascular: Positive for chest pain and leg swelling.  Gastrointestinal: Positive for nausea, vomiting, abdominal pain and diarrhea. Negative for constipation.  Neurological: Positive for dizziness and light-headedness.  Psychiatric/Behavioral: Positive for confusion.    Allergies  Ativan; Droperidol; Ketorolac; Penicillins; and Toradol  Home Medications   Current Outpatient Rx  Name  Route  Sig  Dispense  Refill  . albuterol (PROVENTIL) (5 MG/ML) 0.5% nebulizer solution   Nebulization   Take 0.5 mLs (2.5 mg total) by nebulization every 4 (four) hours as needed for wheezing or shortness of breath.   20 mL   12   . alum & mag hydroxide-simeth (MAALOX/MYLANTA) 200-200-20 MG/5ML suspension   Oral   Take 30 mLs by mouth every 6 (six) hours as needed.   355 mL   0   . ferrous sulfate 325 (65 FE) MG tablet   Oral   Take 325 mg by mouth at bedtime.         . folic acid (FOLVITE) 1 MG tablet   Oral   Take 1 mg by mouth daily.         . furosemide (LASIX) 20 MG tablet   Oral   Take 3 tablets (60 mg total) by mouth 2 (two) times daily.   30 tablet   0   . insulin aspart (NOVOLOG) 100 UNIT/ML injection   Subcutaneous   Inject 6 Units into the skin 3 (three) times daily with meals.         . insulin glargine (LANTUS) 100 UNIT/ML injection   Subcutaneous   Inject 20 Units into the skin at bedtime.         [name] ipratropium (ATROVENT) 0.02 % nebulizer  solution   Nebulization   Take 2.5 mLs (0.5 mg total) by nebulization every 4 (four) hours as needed.   75 mL   12   . KLOR-CON M20 20 MEQ tablet      TAKE 1  TABLET BY MOUTH TWICE A DAY   60 tablet   0   . lactulose (CHRONULAC) 10 GM/15ML solution   Oral   Take 45 mLs (30 g total) by mouth 5 (five) times daily.   1892 mL   11   . levETIRAcetam (KEPPRA) 500 MG tablet      TAKE 1 TABLET BY MOUTH EVERY 12 HOURS   60 tablet   0   . levothyroxine (SYNTHROID, LEVOTHROID) 75 MCG tablet   Oral   Take 75 mcg by mouth daily.         . metolazone (ZAROXOLYN) 5 MG tablet      TAKE 1 TABLET BY MOUTH 30 MINUTES BEFORE TAKING FUROSEMIDE TWICE A DAY   60 tablet   0   . nadolol (CORGARD) 40 MG tablet   Oral   Take 1 tablet (40 mg total) by mouth daily.   30 tablet   0   . omeprazole (PRILOSEC) 20 MG capsule   Oral   Take 20 mg by mouth daily.         . ondansetron (ZOFRAN) 4 MG tablet   Oral   Take 1 tablet (4 mg total) by mouth every 6 (six) hours as needed for nausea.   20 tablet   0   . Oxycodone HCl 10 MG TABS   Oral   Take 1 tablet (10 mg total) by mouth every 4 (four) hours.   180 tablet   0   . PARoxetine (PAXIL) 20 MG tablet   Oral   Take 1 tablet (20 mg total) by mouth daily.   30 tablet   0   . rifaximin (XIFAXAN) 550 MG TABS   Oral   Take 550 mg by mouth 2 (two) times daily.         . risperiDONE (RISPERDAL) 0.25 MG tablet   Oral   Take 0.25 mg by mouth 2 (two) times daily.         [name] spironolactone (ALDACTONE) 100 MG tablet   Oral   Take 100 mg by mouth daily.          BP 92/47  Pulse 67  Temp(Src) 98 F (36.7 C) (Oral)  Resp 18  SpO2 93%  Filed Vitals:   12/29/12 0142 12/29/12 0652 12/29/12 0820 12/29/12 1426  BP:  96/48 98/59 109/63  Pulse:  76 77 92  Temp:  98 F (36.7 C)  98.4 F (36.9 C)  TempSrc:  Oral  Oral  Resp: 49 18 22 20   Height:      Weight:      SpO2:  93%  98%     Physical Exam  Nursing note and  vitals reviewed. Constitutional: He is oriented to person, place, and time. He appears well-developed and well-nourished. No distress.  Chronically ill male  HENT:  Head: Normocephalic and atraumatic.  Eyes: Conjunctivae and EOM are normal. Pupils are equal, round, and reactive to light. Scleral icterus is present.  Mild icterus bilaterally  Neck: Normal range of motion. Neck supple.  Cardiovascular: Normal rate, regular rhythm, normal heart sounds and intact distal pulses.  Exam reveals no gallop and no friction rub.   No murmur heard. Pulmonary/Chest: Effort normal and breath sounds normal. No respiratory distress. He has no wheezes. He has no rales. He exhibits no tenderness.  Abdominal: Soft. Bowel sounds are normal. He exhibits no distension and no mass. There is no tenderness. There is no rebound and no guarding.  Protuberant abdomen.  Surgical scars present. No fluid wave shift.  Musculoskeletal: Normal range of motion. He exhibits edema. He exhibits no tenderness.  Asterisks bilaterally. Trace pitting edema equal bilaterally   Neurological: He is alert and oriented to person, place, and time.  GCS 15. No focal neurological deficits. CN 2-12 intact.   Skin: Skin is warm and dry. He is not diaphoretic.  Spider hemangiomas to the chest    ED Course  Procedures (including critical care time) Labs Review Labs Reviewed - No data to display Imaging Review Dg Chest 2 View  12/27/2012   CLINICAL DATA:  Flank pain former smoker, altered mental status, history cirrhosis, hepatitis-C, seizure disorder, diabetes  EXAM: CHEST  2 VIEW  COMPARISON:  12/03/2012  FINDINGS: Enlargement of cardiac silhouette with left ventricular prominence.  Mediastinal contours and pulmonary vascularity normal.  Right arm PICC line tip projects over SVC above cavoatrial junction.  Question new anterior lung base opacity/ infiltrate on lateral view.  Upper lungs clear.  No pleural effusion or pneumothorax.  Bones  unremarkable.  IMPRESSION: Enlargement of cardiac silhouette.  Question new anterior basilar lung infiltrate on lateral view, potentially lingula.   Electronically Signed   By: Ulyses Southward M.D.   On: 12/27/2012 17:26    EKG Interpretation     Ventricular Rate:  68 PR Interval:  196 QRS Duration: 107 QT Interval:  489 QTC Calculation: 520 R Axis:   -27 Text Interpretation:  Sinus rhythm Borderline left axis deviation Low voltage, precordial leads Prolonged QT interval No significant change was found           Results for orders placed during the hospital encounter of 12/28/12  CBC WITH DIFFERENTIAL      Result Value Range   WBC 3.7 (*) 4.0 - 10.5 K/uL   RBC 3.03 (*) 4.22 - 5.81 MIL/uL   Hemoglobin 10.9 (*) 13.0 - 17.0 g/dL   HCT 40.9 (*) 81.1 - 91.4 %   MCV 99.3  78.0 - 100.0 fL   MCH 36.0 (*) 26.0 - 34.0 pg   MCHC 36.2 (*) 30.0 - 36.0 g/dL   RDW 78.2 (*) 95.6 - 21.3 %   Platelets 34 (*) 150 - 400 K/uL   Neutrophils Relative % 74  43 - 77 %   Neutro Abs 2.7  1.7 - 7.7 K/uL   Lymphocytes Relative 16  12 - 46 %   Lymphs Abs 0.6 (*) 0.7 - 4.0 K/uL   Monocytes Relative 7  3 - 12 %   Monocytes Absolute 0.3  0.1 - 1.0 K/uL   Eosinophils Relative 4  0 - 5 %   Eosinophils Absolute 0.1  0.0 - 0.7 K/uL   Basophils Relative 0  0 - 1 %   Basophils Absolute 0.0  0.0 - 0.1 K/uL  COMPREHENSIVE METABOLIC PANEL      Result Value Range   Sodium 128 (*) 135 - 145 mEq/L   Potassium 3.2 (*) 3.5 - 5.1 mEq/L   Chloride 91 (*) 96 - 112 mEq/L   CO2 31  19 - 32 mEq/L   Glucose, Bld 195 (*) 70 - 99 mg/dL   BUN 19  6 - 23 mg/dL   Creatinine, Ser 0.86  0.50 - 1.35 mg/dL   Calcium 8.8  8.4 - 57.8 mg/dL   Total Protein 6.7  6.0 - 8.3 g/dL   Albumin 2.3 (*) 3.5 - 5.2 g/dL   AST 47 (*) 0 - 37 U/L   ALT 22  0 -  53 U/L   Alkaline Phosphatase 167 (*) 39 - 117 U/L   Total Bilirubin 2.4 (*) 0.3 - 1.2 mg/dL   GFR calc non Af Amer 89 (*) >90 mL/min   GFR calc Af Amer >90  >90 mL/min  LIPASE,  BLOOD      Result Value Range   Lipase 49  11 - 59 U/L  TROPONIN I      Result Value Range   Troponin I <0.30  <0.30 ng/mL  URINALYSIS, ROUTINE W REFLEX MICROSCOPIC      Result Value Range   Color, Urine YELLOW  YELLOW   APPearance CLEAR  CLEAR   Specific Gravity, Urine 1.013  1.005 - 1.030   pH 6.5  5.0 - 8.0   Glucose, UA NEGATIVE  NEGATIVE mg/dL   Hgb urine dipstick NEGATIVE  NEGATIVE   Bilirubin Urine NEGATIVE  NEGATIVE   Ketones, ur NEGATIVE  NEGATIVE mg/dL   Protein, ur NEGATIVE  NEGATIVE mg/dL   Urobilinogen, UA 1.0  0.0 - 1.0 mg/dL   Nitrite NEGATIVE  NEGATIVE   Leukocytes, UA NEGATIVE  NEGATIVE  AMMONIA      Result Value Range   Ammonia 181 (*) 11 - 60 umol/L  MAGNESIUM      Result Value Range   Magnesium 1.6  1.5 - 2.5 mg/dL  BLOOD GAS, ARTERIAL      Result Value Range   FIO2 0.21     pH, Arterial 7.452 (*) 7.350 - 7.450   pCO2 arterial 46.1 (*) 35.0 - 45.0 mmHg   pO2, Arterial 77.4 (*) 80.0 - 100.0 mmHg   Bicarbonate 31.7 (*) 20.0 - 24.0 mEq/L   TCO2 28.8  0 - 100 mmol/L   Acid-Base Excess 7.2 (*) 0.0 - 2.0 mmol/L   O2 Saturation 95.1     Patient temperature 98.6     Collection site LEFT RADIAL     Drawn by [id number]     Sample type ARTERIAL DRAW     Allens test (pass/fail) PASS  PASS  AMMONIA      Result Value Range   Ammonia 84 (*) 11 - 60 umol/L  GLUCOSE, CAPILLARY      Result Value Range   Glucose-Capillary 160 (*) 70 - 99 mg/dL   Comment 1 Notify RN    PROTIME-INR      Result Value Range   Prothrombin Time 21.2 (*) 11.6 - 15.2 seconds   INR 1.90 (*) 0.00 - 1.49  COMPREHENSIVE METABOLIC PANEL      Result Value Range   Sodium 130 (*) 135 - 145 mEq/L   Potassium 2.9 (*) 3.5 - 5.1 mEq/L   Chloride 91 (*) 96 - 112 mEq/L   CO2 34 (*) 19 - 32 mEq/L   Glucose, Bld 158 (*) 70 - 99 mg/dL   BUN 19  6 - 23 mg/dL   Creatinine, Ser 9.14  0.50 - 1.35 mg/dL   Calcium 8.9  8.4 - 78.2 mg/dL   Total Protein 6.6  6.0 - 8.3 g/dL   Albumin 2.3 (*) 3.5 - 5.2 g/dL    AST 48 (*) 0 - 37 U/L   ALT 22  0 - 53 U/L   Alkaline Phosphatase 157 (*) 39 - 117 U/L   Total Bilirubin 2.5 (*) 0.3 - 1.2 mg/dL   GFR calc non Af Amer 77 (*) >90 mL/min   GFR calc Af Amer 89 (*) >90 mL/min  CBC WITH DIFFERENTIAL      Result Value Range  WBC 4.8  4.0 - 10.5 K/uL   RBC 3.07 (*) 4.22 - 5.81 MIL/uL   Hemoglobin 10.9 (*) 13.0 - 17.0 g/dL   HCT 16.1 (*) 09.6 - 04.5 %   MCV 100.0  78.0 - 100.0 fL   MCH 35.5 (*) 26.0 - 34.0 pg   MCHC 35.5  30.0 - 36.0 g/dL   RDW 40.9 (*) 81.1 - 91.4 %   Platelets 41 (*) 150 - 400 K/uL   Neutrophils Relative % 65  43 - 77 %   Lymphocytes Relative 22  12 - 46 %   Monocytes Relative 7  3 - 12 %   Eosinophils Relative 6 (*) 0 - 5 %   Basophils Relative 0  0 - 1 %   Neutro Abs 3.1  1.7 - 7.7 K/uL   Lymphs Abs 1.1  0.7 - 4.0 K/uL   Monocytes Absolute 0.3  0.1 - 1.0 K/uL   Eosinophils Absolute 0.3  0.0 - 0.7 K/uL   Basophils Absolute 0.0  0.0 - 0.1 K/uL   WBC Morphology MILD LEFT SHIFT (1-5% METAS, OCC MYELO, OCC BANDS)     Smear Review PLATELET COUNT CONFIRMED BY SMEAR      MDM   1. Cirrhosis   2. Hepatic encephalopathy   3. Hyponatremia   4. Thrombocytopenia, acquired     Drayke Grabel is a 57 y.o. male with a PMH of liver cirrhosis, hepatitis C, alcohol abuse, hypothyroidism, psychosis, bipolar disorder, seizures, arthritis, back pain, pancytopenia, esophageal varies, metabolic & hepatic encephalopathy, renal failure, DM, GERD, and PVD who presents to the ED for dizziness and emesis.     Rechecks  5:58 PM = Patient states "I feel great" and "I want to go home."    7:45 PM = Spoke with patient's daughter who states that he has had increased confusion in the past two days (since Friday).  He also has been more sluggish and tired.  He had several episodes of emesis today per daughter.  She is his POA and believes he requires admission. She thought he would stay yesterday.     Consults  8:06 PM = Spoke with Dr. Julian Reil who is  coming to evaluate the patient.       Patient being admitted for AMS likely due to hepatic encephalopathy.  Patient has had intermittent episodes of AMS for the past few days according to the daughter.  Patient appeared confused at times during his ED visit, however, was alert and oriented with no focal neurological deficits.  His ammonia was elevated at 181.  Patient was also found to have elevated liver enzymes, hyponatremia, anemia, and thrombocytosis which appears to be his baseline.   Final impressions: 1. Hepatic encephalopathy  2. Cirrhosis 3. Hyponatremia 4. Thrombocytopenia     Luiz Iron PA-C   This patient was discussed with Dr. Harrel Lemon, PA-C 12/29/12 1746

## 2012-12-28 NOTE — H&P (Addendum)
[facility] History and Physical  Tony Boyd NWG:[id number] DOB: [birth year] DOA: 12/28/2012  Referring physician: ED PCP: [name], DO   Chief Complaint: Confusion  HPI: Tony Boyd is a 57 y.o. male who is brought in by his daughter from his [facility] with increasing confusion over the past few days.  Patient is intermittently confused and not able to contribute a whole lot to his history.  Per his daughter he has been having intermittent confusion over the past few days.  Patient states he is taking his lactulose 5 times a day at home.  He did have nausea and vomiting to day he states and a "little diarrhea".  No blood in vomit, no melena, no blood in stool.  Work up in the ED is c/w hepatic encephalopathy with the patient demonstrating classic signs of asterixis and a markedly elevated ammonia level of 181.  Review of Systems: 12 systems reviewed and otherwise negative.  Past Medical History  Diagnosis Date  . Cirrhosis of liver   . Hepatitis C   . ETOH abuse   . Hypothyroid   . Psychosis   . Bipolar disorder   . Seizures   . Arthritis   . Back pain   . Coagulopathy     Hx of  . Thrombocytopenia     Hx of  . Pancytopenia     Hx of  . H/O hypokalemia   . Hyponatremia     Hx of  . Korsakoff psychosis   . H/O abdominal abscess   . H/O esophageal varices   . Metabolic encephalopathy   . Hepatic encephalopathy   . Urinary urgency   . H/O renal failure   . H/O ascites   . Altered mental status   . Anemia   . Ascites   . Shortness of breath   . Peripheral vascular disease   . GERD (gastroesophageal reflux disease)   . Thrombocytopenia, acquired 06/02/2012  . Unspecified hypothyroidism 06/02/2012  . Pain in joint, pelvic region and thigh   . Edema   . Obesity, unspecified   . Chest pain, unspecified   . Pneumonitis due to inhalation of food or vomitus   . Other convulsions   . Cellulitis and abscess of upper arm and forearm   . Chronic hepatitis C without  mention of hepatic coma   . Hypopotassemia   . Anemia, unspecified   . Other and unspecified coagulation defects   . Thrombocytopenia, unspecified   . Bipolar I disorder, most recent episode (or current) unspecified   . Unspecified psychosis   . Encephalopathy   . Esophageal varices without mention of bleeding   . Esophagitis, unspecified   . Abscess of liver(572.0)   . Chronic kidney disease, unspecified   . Lumbago   . Personal history of alcoholism   . Personal history of tobacco use, presenting hazards to health   . Cirrhosis of liver without mention of alcohol   . Pneumonitis due to inhalation of food or vomitus 09/19/2010  . Chronic hepatitis C without mention of hepatic coma   . Bipolar I disorder, most recent episode (or current) unspecified   . Unspecified psychosis   . Other ascites   . Personal history of tobacco use, presenting hazards to health   . Cirrhosis of liver without mention of alcohol   . Type 2 diabetes mellitus with diabetic neuropathy    Past Surgical History  Procedure Laterality Date  . Colostomy    . Cholecystectomy    .  Small intestine surgery    . Arm surgery      left arm  . US guided drain placement in ventral hernia abscess  07/21/2010  . Multiple picc line placements    . Esophagogastroduodenoscopy  06/28/2011    Procedure: ESOPHAGOGASTRODUODENOSCOPY (EGD);  Surgeon: Louis Meckel, MD;  Location: [facility];  Service: Endoscopy;  Laterality: N/A;  . Esophagogastroduodenoscopy N/A 05/06/2012    Procedure: ESOPHAGOGASTRODUODENOSCOPY (EGD);  Surgeon: Louis Meckel, MD;  Location: [facility];  Service: Endoscopy;  Laterality: N/A;  . Esophageal banding N/A 05/06/2012    Procedure: ESOPHAGEAL BANDING;  Surgeon: Louis Meckel, MD;  Location: [facility];  Service: Endoscopy;  Laterality: N/A;   Social History:  reports that he quit smoking about 4 years ago. His smoking use included Cigarettes. He smoked 0.00 packs per day. He has never used  smokeless tobacco. He reports that he does not drink alcohol or use illicit drugs.   Allergies  Allergen Reactions  . Ativan [Lorazepam] Other (See Comments)    "hallucinations"  . Droperidol Other (See Comments)    unknown  . Ketorolac Other (See Comments)    unknown  . Penicillins Swelling and Other (See Comments)    unkown  . Toradol [Ketorolac Tromethamine] Hives    Family History  Problem Relation Age of Onset  . Heart disease Mother     MI  . Lung cancer Brother     Prior to Admission medications   Medication Sig Start Date End Date Taking? Authorizing Provider  albuterol (PROVENTIL) (5 MG/ML) 0.5% nebulizer solution Take 0.5 mLs (2.5 mg total) by nebulization every 4 (four) hours as needed for wheezing or shortness of breath. 12/11/12  Yes Drema Dallas, MD  alum & mag hydroxide-simeth (MAALOX/MYLANTA) 200-200-20 MG/5ML suspension Take 30 mLs by mouth every 6 (six) hours as needed. 12/11/12  Yes Drema Dallas, MD  ferrous sulfate 325 (65 FE) MG tablet Take 325 mg by mouth at bedtime.   Yes Historical Provider, MD  folic acid (FOLVITE) 1 MG tablet Take 1 mg by mouth daily.   Yes Historical Provider, MD  furosemide (LASIX) 20 MG tablet Take 3 tablets (60 mg total) by mouth 2 (two) times daily. 12/11/12  Yes Drema Dallas, MD  insulin aspart (NOVOLOG) 100 UNIT/ML injection Inject 6 Units into the skin 3 (three) times daily with meals.   Yes Historical Provider, MD  insulin glargine (LANTUS) 100 UNIT/ML injection Inject 20 Units into the skin at bedtime.   Yes Historical Provider, MD  ipratropium (ATROVENT) 0.02 % nebulizer solution Take 2.5 mLs (0.5 mg total) by nebulization every 4 (four) hours as needed. 12/11/12  Yes Drema Dallas, MD  KLOR-CON M20 20 MEQ tablet TAKE 1 TABLET BY MOUTH TWICE A DAY 12/16/12  Yes Kimber Relic, MD  lactulose (CHRONULAC) 10 GM/15ML solution Take 45 mLs (30 g total) by mouth 5 (five) times daily. 11/14/12  Yes Catarina Hartshorn, MD  levETIRAcetam  (KEPPRA) 500 MG tablet TAKE 1 TABLET BY MOUTH EVERY 12 HOURS 12/16/12  Yes Kimber Relic, MD  levothyroxine (SYNTHROID, LEVOTHROID) 75 MCG tablet Take 75 mcg by mouth daily.   Yes Historical Provider, MD  metolazone (ZAROXOLYN) 5 MG tablet TAKE 1 TABLET BY MOUTH 30 MINUTES BEFORE TAKING FUROSEMIDE TWICE A DAY 12/16/12  Yes Kimber Relic, MD  nadolol (CORGARD) 40 MG tablet Take 1 tablet (40 mg total) by mouth daily. 12/11/12  Yes Drema Dallas, MD  omeprazole [facility])  20 MG capsule Take 20 mg by mouth daily.   Yes Historical Provider, MD  ondansetron (ZOFRAN) 4 MG tablet Take 1 tablet (4 mg total) by mouth every 6 (six) hours as needed for nausea. 12/11/12  Yes Drema Dallas, MD  Oxycodone HCl 10 MG TABS Take 1 tablet (10 mg total) by mouth every 4 (four) hours. 12/26/12  Yes Tiffany L Reed, DO  PARoxetine (PAXIL) 20 MG tablet Take 1 tablet (20 mg total) by mouth daily. 12/11/12  Yes Drema Dallas, MD  rifaximin (XIFAXAN) 550 MG TABS Take 550 mg by mouth 2 (two) times daily. 01/26/12  Yes Alison Murray, MD  risperiDONE (RISPERDAL) 0.25 MG tablet Take 0.25 mg by mouth 2 (two) times daily.   Yes Historical Provider, MD  spironolactone (ALDACTONE) 100 MG tablet Take 100 mg by mouth daily.   Yes Historical Provider, MD   Physical Exam: Filed Vitals:   12/28/12 1954  BP: 120/78  Pulse: 67  Temp: 98.1 F (36.7 C)  Resp:     General:  NAD, resting comfortably in bed Eyes: PEERLA EOMI ENT: mucous membranes moist Neck: supple w/o JVD Cardiovascular: RRR w/o MRG Respiratory: CTA B Abdomen: soft, nt, nd, bs+ Skin: no rash nor lesion Musculoskeletal: MAE, full ROM all 4 extremities Psychiatric: confused tone and affect Neurologic: asterixis on exam  Labs on Admission:  Basic Metabolic Panel:  Recent Labs Lab 12/27/12 1650 12/28/12 1551 12/28/12 1630  NA 124* 128*  --   K 2.8* 3.2*  --   CL 86* 91*  --   CO2 33* 31  --   GLUCOSE 166* 195*  --   BUN 18 19  --   CREATININE 0.80  0.99  --   CALCIUM 8.3* 8.8  --   MG  --   --  1.6   Liver Function Tests:  Recent Labs Lab 12/27/12 1650 12/28/12 1551  AST 51* 47*  ALT 23 22  ALKPHOS 155* 167*  BILITOT 3.1* 2.4*  PROT 6.4 6.7  ALBUMIN 2.2* 2.3*    Recent Labs Lab 12/28/12 1551  LIPASE 49    Recent Labs Lab 12/28/12 1630  AMMONIA 181*   CBC:  Recent Labs Lab 12/27/12 1650 12/28/12 1551  WBC 4.5 3.7*  NEUTROABS 3.4 2.7  HGB 10.7* 10.9*  HCT 29.4* 30.1*  MCV 98.3 99.3  PLT 36* 34*   Cardiac Enzymes:  Recent Labs Lab 12/28/12 1551  TROPONINI <0.30    BNP (last 3 results)  Recent Labs  12/03/12 1435  PROBNP 297.7*   CBG: No results found for this basename: GLUCAP,  in the last 168 hours  Radiological Exams on Admission: Dg Chest 2 View  12/27/2012   CLINICAL DATA:  Flank pain former smoker, altered mental status, history cirrhosis, hepatitis-C, seizure disorder, diabetes  EXAM: CHEST  2 VIEW  COMPARISON:  12/03/2012  FINDINGS: Enlargement of cardiac silhouette with left ventricular prominence.  Mediastinal contours and pulmonary vascularity normal.  Right arm PICC line tip projects over SVC above cavoatrial junction.  Question new anterior lung base opacity/ infiltrate on lateral view.  Upper lungs clear.  No pleural effusion or pneumothorax.  Bones unremarkable.  IMPRESSION: Enlargement of cardiac silhouette.  Question new anterior basilar lung infiltrate on lateral view, potentially lingula.   Electronically Signed   By: Ulyses Southward M.D.   On: 12/27/2012 17:26    EKG: Independently reviewed.  Assessment/Plan Principal Problem:   Hepatic encephalopathy Active Problems:   ETOH abuse  Cirrhosis   Thrombocytopenia, acquired   1. Hepatic encephalopathy - ammonia of 181 despite patient reportedly taking lactulose 5 times a day at home.  Will increase to Q4H initially during this hospital stay.  Likely needs GI consult but patient is already on nearly 2x the usual maximum dose of  lactulose, and on xifaxan and still developing hepatic encephalopathy with markedly elevated ammonia levels at home.  Repeat ammonia in AM. 2. Thrombocytopenia - from liver failure. 3. Cirrhosis - patient with apparent ESLD 4. Hyponatremia - sodium of 128 is a poor prognostic indicator in cirrhosis, was 127 at discharge last time. 5. DM2 - continue home insulin and ordering CBG checks AC/HS    Code Status: limited code (no CPR) (must indicate code status--if unknown or must be presumed, indicate so) Family Communication: No family in room (indicate person spoken with, if applicable, with phone number if by telephone) Disposition Plan: Admit to inpatient (indicate anticipated LOS)  Time spent: 70 min  Mohab Ashby M. [facility] Pager [phone]  If 7PM-7AM, please contact night-coverage [email or web address] Password [facility] 12/28/2012, 8:29 PM

## 2012-12-28 NOTE — ED Notes (Signed)
Bed: ZO10 Expected date:  Expected time:  Means of arrival:  Comments: Cirrhosis, one episode of vomiting

## 2012-12-28 NOTE — ED Notes (Signed)
Staff at [facility] report (in the presence of his daughter) that pt. "vomited once".  EMS were told by pt. That "whenever I vomit the doctor said to go to the hospital".  When I ask him why he is here he tells me repeatedly, "I'm dizzy".  He is in no distress.  Anasarca present.  He is moderately short of breath and in no distress.  I note a dual-lumen P.I.C.C. Line in right upper arm.

## 2012-12-28 NOTE — ED Provider Notes (Signed)
Medical screening examination/treatment/procedure(s) were conducted as a shared visit with non-physician practitioner(s) and myself.  I personally evaluated the patient during the encounter.  EKG Interpretation   None       57 yo male with multiple medical problems, who was evaluated yesterday for a PICC line issue, presenting today with dizziness.  Groggy on my exam and unable to provide much additional history.  Screening labs and monitoring initiated.    Symptoms, exam, and labs consistent with hepatic encephalopathy.  Plan admission.    Clinical Impression: 1. Cirrhosis   2. Hepatic encephalopathy   3. Hyponatremia   4. Thrombocytopenia, acquired       Candyce Churn, MD 12/29/12 0003

## 2012-12-28 NOTE — ED Notes (Signed)
Pt. Remains drowsy, and is repetitive in his verbal responses; however, he does capably ambulate in our hallway with one assist.

## 2012-12-28 NOTE — ED Provider Notes (Signed)
Medical screening examination/treatment/procedure(s) were conducted as a shared visit with non-physician practitioner(s) and myself.  I personally evaluated the patient during the encounter.  EKG Interpretation   None         Candyce Churn, MD 12/28/12 [phone]

## 2012-12-29 LAB — URINE CULTURE: Colony Count: 3000

## 2012-12-29 LAB — CBC WITH DIFFERENTIAL/PLATELET
Basophils Relative: 0 % (ref 0–1)
Eosinophils Relative: 6 % — ABNORMAL HIGH (ref 0–5)
HCT: 30.7 % — ABNORMAL LOW (ref 39.0–52.0)
Hemoglobin: 10.9 g/dL — ABNORMAL LOW (ref 13.0–17.0)
Lymphs Abs: 1.1 10*3/uL (ref 0.7–4.0)
MCH: 35.5 pg — ABNORMAL HIGH (ref 26.0–34.0)
MCV: 100 fL (ref 78.0–100.0)
Monocytes Absolute: 0.3 10*3/uL (ref 0.1–1.0)
Neutro Abs: 3.1 10*3/uL (ref 1.7–7.7)
Neutrophils Relative %: 65 % (ref 43–77)
Platelets: 41 10*3/uL — ABNORMAL LOW (ref 150–400)
RBC: 3.07 MIL/uL — ABNORMAL LOW (ref 4.22–5.81)

## 2012-12-29 LAB — COMPREHENSIVE METABOLIC PANEL
AST: 48 U/L — ABNORMAL HIGH (ref 0–37)
Albumin: 2.3 g/dL — ABNORMAL LOW (ref 3.5–5.2)
Calcium: 8.9 mg/dL (ref 8.4–10.5)
Chloride: 91 mEq/L — ABNORMAL LOW (ref 96–112)
Creatinine, Ser: 1.05 mg/dL (ref 0.50–1.35)
Glucose, Bld: 158 mg/dL — ABNORMAL HIGH (ref 70–99)
Potassium: 2.9 mEq/L — ABNORMAL LOW (ref 3.5–5.1)
Total Bilirubin: 2.5 mg/dL — ABNORMAL HIGH (ref 0.3–1.2)
Total Protein: 6.6 g/dL (ref 6.0–8.3)

## 2012-12-29 LAB — GLUCOSE, CAPILLARY: Glucose-Capillary: 160 mg/dL — ABNORMAL HIGH (ref 70–99)

## 2012-12-29 LAB — PROTIME-INR
INR: 1.9 — ABNORMAL HIGH (ref 0.00–1.49)
Prothrombin Time: 21.2 seconds — ABNORMAL HIGH (ref 11.6–15.2)

## 2012-12-29 MED ORDER — SPIRONOLACTONE 50 MG PO TABS
150.0000 mg | ORAL_TABLET | Freq: Every day | ORAL | Status: DC
  Administered 2012-12-30 – 2012-12-31 (×2): 150 mg via ORAL
  Filled 2012-12-29 (×2): qty 1

## 2012-12-29 MED ORDER — FUROSEMIDE 40 MG PO TABS
60.0000 mg | ORAL_TABLET | Freq: Every day | ORAL | Status: DC
  Administered 2012-12-30 – 2012-12-31 (×2): 60 mg via ORAL
  Filled 2012-12-29 (×2): qty 1

## 2012-12-29 MED ORDER — ENSURE COMPLETE PO LIQD
237.0000 mL | Freq: Two times a day (BID) | ORAL | Status: DC
  Administered 2012-12-29 – 2012-12-31 (×5): 237 mL via ORAL

## 2012-12-29 MED ORDER — SODIUM CHLORIDE 0.9 % IJ SOLN
10.0000 mL | INTRAMUSCULAR | Status: DC | PRN
  Administered 2012-12-29 (×2): 20 mL
  Administered 2012-12-30: 10 mL
  Administered 2012-12-30: 20 mL
  Administered 2012-12-30: 10 mL

## 2012-12-29 MED ORDER — NADOLOL 20 MG PO TABS
20.0000 mg | ORAL_TABLET | Freq: Every day | ORAL | Status: DC
  Administered 2012-12-30 – 2012-12-31 (×2): 20 mg via ORAL
  Filled 2012-12-29 (×2): qty 1

## 2012-12-29 NOTE — Progress Notes (Signed)
INITIAL NUTRITION ASSESSMENT  DOCUMENTATION CODES Per approved criteria  -Obesity Unspecified   INTERVENTION: - Ensure Complete BID - Will continue to monitor   NUTRITION DIAGNOSIS: Inadequate oral intake related to confusion as evidenced by 0-25% meal intake.   Goal: Pt to consume >90% of meals/supplements  Monitor:  Weights, labs, intake  Reason for Assessment: Nutrition risk   57 y.o. male  Admitting Dx: Hepatic encephalopathy  ASSESSMENT: Pt known to RD from admission earlier this month. Pt with end stage liver disease, admitted with acute hepatic metabolic encephalopathy. Pt with some confusion, able to state he didn't eat anything for 2 days PTA but before then he was eating well and drinking 1-2 Ensure/day. Reports ascites improved. Had some nausea earlier this morning but none today. Weight down 30 pounds in the past month, unclear how much of this is fluid related. Pt ate 100% of meals during last admission. Observed uneaten lunch tray, pt attempted to eat some fruit from fruit bowl but dropped some fruit on the floor. This is pt's 8th admission in the past 6 months.   - Sodium and potassium low, potassium getting replaced - Alk phos, AST, total bilirubin, and ammonia elevated  Pt is on ferrous sulfate, folic acid, lasix, and lactulose.   Height: Ht Readings from Last 1 Encounters:  12/28/12 5\' 8"  (1.727 m)    Weight: Wt Readings from Last 1 Encounters:  12/28/12 228 lb 13.4 oz (103.8 kg)    Ideal Body Weight: 154 lb  % Ideal Body Weight: 148%  Wt Readings from Last 10 Encounters:  12/28/12 228 lb 13.4 oz (103.8 kg)  12/11/12 258 lb 11.2 oz (117.346 kg)  11/12/12 281 lb 12 oz (127.8 kg)  10/14/12 310 lb 3 oz (140.7 kg)  10/08/12 325 lb 2.9 oz (147.5 kg)  10/02/12 302 lb (136.986 kg)  09/25/12 273 lb (123.832 kg)  09/21/12 252 lb 6.8 oz (114.5 kg)  08/28/12 245 lb 2.4 oz (111.2 kg)  08/15/12 274 lb (124.286 kg)    Usual Body Weight: 258 lb last  month  % Usual Body Weight: 88%  BMI:  Body mass index is 34.8 kg/(m^2). Class I obesity  Estimated Nutritional Needs: Kcal: 1750-1950 Protein: 85-105g Fluid: 1.7-1.9L/day  Skin: Intact  Diet Order: Hepatic  EDUCATION NEEDS: -No education needs identified at this time   Intake/Output Summary (Last 24 hours) at 12/29/12 1709 Last data filed at 12/29/12 1531  Gross per 24 hour  Intake    480 ml  Output      0 ml  Net    480 ml    Last BM: 11/2  Labs:   Recent Labs Lab 12/27/12 1650 12/28/12 1551 12/28/12 1630 12/29/12 1430  NA 124* 128*  --  130*  K 2.8* 3.2*  --  2.9*  CL 86* 91*  --  91*  CO2 33* 31  --  34*  BUN 18 19  --  19  CREATININE 0.80 0.99  --  1.05  CALCIUM 8.3* 8.8  --  8.9  MG  --   --  1.6  --   GLUCOSE 166* 195*  --  158*    CBG (last 3)   Recent Labs  12/29/12 0740  GLUCAP 160*    Scheduled Meds: . ferrous sulfate  325 mg Oral QHS  . folic acid  1 mg Oral Daily  . furosemide  60 mg Oral BID  . insulin aspart  6 Units Subcutaneous TID WC  . insulin  glargine  20 Units Subcutaneous QHS  . lactulose  30 g Oral Q4H  . levETIRAcetam  500 mg Oral BID  . levothyroxine  75 mcg Oral Daily  . [START ON 12/30/2012] nadolol  20 mg Oral Daily  . oxyCODONE  10 mg Oral Q4H  . pantoprazole  40 mg Oral Daily  . PARoxetine  20 mg Oral Daily  . potassium chloride SA  20 mEq Oral BID  . rifaximin  550 mg Oral BID  . risperiDONE  0.25 mg Oral BID  . [START ON 12/30/2012] spironolactone  150 mg Oral Daily    Continuous Infusions:   Past Medical History  Diagnosis Date  . Cirrhosis of liver   . Hepatitis C   . ETOH abuse   . Hypothyroid   . Psychosis   . Bipolar disorder   . Seizures   . Arthritis   . Back pain   . Coagulopathy     Hx of  . Thrombocytopenia     Hx of  . Pancytopenia     Hx of  . H/O hypokalemia   . Hyponatremia     Hx of  . Korsakoff psychosis   . H/O abdominal abscess   . H/O esophageal varices   . Metabolic  encephalopathy   . Hepatic encephalopathy   . Urinary urgency   . H/O renal failure   . H/O ascites   . Altered mental status   . Anemia   . Ascites   . Shortness of breath   . Peripheral vascular disease   . GERD (gastroesophageal reflux disease)   . Thrombocytopenia, acquired 06/02/2012  . Unspecified hypothyroidism 06/02/2012  . Pain in joint, pelvic region and thigh   . Edema   . Obesity, unspecified   . Chest pain, unspecified   . Pneumonitis due to inhalation of food or vomitus   . Other convulsions   . Cellulitis and abscess of upper arm and forearm   . Chronic hepatitis C without mention of hepatic coma   . Hypopotassemia   . Anemia, unspecified   . Other and unspecified coagulation defects   . Thrombocytopenia, unspecified   . Bipolar I disorder, most recent episode (or current) unspecified   . Unspecified psychosis   . Encephalopathy   . Esophageal varices without mention of bleeding   . Esophagitis, unspecified   . Abscess of liver(572.0)   . Chronic kidney disease, unspecified   . Lumbago   . Personal history of alcoholism   . Personal history of tobacco use, presenting hazards to health   . Cirrhosis of liver without mention of alcohol   . Pneumonitis due to inhalation of food or vomitus 09/19/2010  . Chronic hepatitis C without mention of hepatic coma   . Bipolar I disorder, most recent episode (or current) unspecified   . Unspecified psychosis   . Other ascites   . Personal history of tobacco use, presenting hazards to health   . Cirrhosis of liver without mention of alcohol   . Type 2 diabetes mellitus with diabetic neuropathy     Past Surgical History  Procedure Laterality Date  . Colostomy    . Cholecystectomy    . Small intestine surgery    . Arm surgery      left arm  . US guided drain placement in ventral hernia abscess  07/21/2010  . Multiple picc line placements    . Esophagogastroduodenoscopy  06/28/2011    Procedure: ESOPHAGOGASTRODUODENOSCOPY  (EGD);  Surgeon: Molly Maduro  Rosalio Macadamia, MD;  Location: [facility];  Service: Endoscopy;  Laterality: N/A;  . Esophagogastroduodenoscopy N/A 05/06/2012    Procedure: ESOPHAGOGASTRODUODENOSCOPY (EGD);  Surgeon: Louis Meckel, MD;  Location: [facility];  Service: Endoscopy;  Laterality: N/A;  . Esophageal banding N/A 05/06/2012    Procedure: ESOPHAGEAL BANDING;  Surgeon: Louis Meckel, MD;  Location: [facility];  Service: Endoscopy;  Laterality: N/A;    Levon Hedger MS, RD, LDN [phone] Pager [phone] After Hours Pager

## 2012-12-29 NOTE — Progress Notes (Signed)
1:06 PM I agree with HPI/GPe and A/P per Dr. 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 Patient was discharged home and came back to the emergency room on 12/27/12 with back pain-no findings were found and patient was to followup with his regular physician 11/3 Patient presented to [facility] 11/2/p.m. with increasing confusion, inability to perform history and nausea vomiting and some diarrhea. Patient had classical hepatic encephalopathy with asterixis, admission Nh3 level was 181  Patient still exhibits some confusion but is much more awake and alert. He has to go to the bathroom. He states that on 11/29 he started having episodes of profuse vomiting. He thinks that today is Friday although it is actually Monday. He states that his last alcoholic drink was about one year ago-he currently lives alone and his daughters come to visit him occasionally He denies any chest pain or shortness of breath currently         HEENT icteric, no pallor, moderate dentition CHEST clinically clear CARDIAC S1-S2 no murmur rub or gallop ABDOMEN distended, midline scar, no rebound or guarding however does have mild fluid collection/ascites NEURO confused  Patient Active Problem List   Diagnosis Date Noted  . Hyperkalemia 12/08/2012  . PTSD (post-traumatic stress disorder) 12/07/2012  . Hypomagnesemia 12/04/2012  . Normocytic anemia 12/03/2012  . Dyspnea, rule out CHF in a patient with elevated pro-BNP, anasarca 12/03/2012  . H/O bipolar disorder 12/03/2012  . Palliative care encounter 10/09/2012  . Weakness generalized 10/09/2012  . Anemia of  chronic disease 10/07/2012  . Moderate protein-calorie malnutrition 10/07/2012  . Hypokalemia 10/07/2012  . Anasarca 10/06/2012  . Scrotal swelling 10/06/2012  . Diabetes mellitus type 2, uncontrolled, with complications 08/08/2012  . Seizure disorder 06/02/2012  . Thrombocytopenia, acquired 06/02/2012  . Hypothyroidism 06/02/2012  . Esophageal varices without mention of bleeding 06/28/2011  . Cirrhosis 05/30/2011  . Hepatic encephalopathy 04/18/2011  . ETOH abuse 04/18/2011  . Hepatitis C 04/18/2011  . Coagulopathy 04/18/2011  . Hyponatremia 04/18/2011    1. Acute hepatic metabolic encephalopathy-prior MELD score of 31-patient has had at least 6-7 admissions this year with the same issue. I'm not sure if this is noncompliance related vs. end-stage liver disease. I will speak to his primary care physician today to determine best possible outcome and placement options for him- I have left Dr. Renato Gails a message to call me back 2. End stage liver disease-meld score to be calculated once labs are complete 3. History hepatitis C-quantitative HCV = 34,479 10/22.  Unlikely candidate for Sofusbuvir given her advanced disease 4. Hypervolemic hyponatremia secondary to cirrhosis-continue lasix, but cut back to 60 od day by mouth, increase Aldactone to 150 mg daily 5. Hypertension-patient is actually hypotensive, some evidence to suggest not increasing antihypertensive regimen as this decreases survival.-Cut back now we'll off from 40-20 mg 6. Coagulopathy of the related to cirrhosis-monitor platelet count-no action needed currently as above 20.  SCDs 7. Diabetes mellitus-A1c 5.3 10/06/2012-monitor.  wouldn't control 8. Seizure disorder likely alcohol related-continue Keppra 500 twice a day 9. Microcytic anemia-monitor closely the labs 10. Severe protein energy malnutrition-Albumin 2.3.    11. Hypokalemia-secondary diuresis-replace it if below 3.0  Long discussion c daughter Tamera Punt on phone. Will  get PT/OT to see  Pleas Koch, MD [facility] [phone]

## 2012-12-30 DIAGNOSIS — D638 Anemia in other chronic diseases classified elsewhere: Secondary | ICD-10-CM

## 2012-12-30 DIAGNOSIS — I8500 Esophageal varices without bleeding: Secondary | ICD-10-CM

## 2012-12-30 DIAGNOSIS — D689 Coagulation defect, unspecified: Secondary | ICD-10-CM

## 2012-12-30 DIAGNOSIS — R609 Edema, unspecified: Secondary | ICD-10-CM

## 2012-12-30 DIAGNOSIS — F1010 Alcohol abuse, uncomplicated: Secondary | ICD-10-CM

## 2012-12-30 LAB — COMPREHENSIVE METABOLIC PANEL
Albumin: 2.2 g/dL — ABNORMAL LOW (ref 3.5–5.2)
Alkaline Phosphatase: 163 U/L — ABNORMAL HIGH (ref 39–117)
BUN: 20 mg/dL (ref 6–23)
CO2: 33 mEq/L — ABNORMAL HIGH (ref 19–32)
Calcium: 8.8 mg/dL (ref 8.4–10.5)
Chloride: 95 mEq/L — ABNORMAL LOW (ref 96–112)
Creatinine, Ser: 1.14 mg/dL (ref 0.50–1.35)
GFR calc non Af Amer: 70 mL/min — ABNORMAL LOW (ref >90)
Glucose, Bld: 153 mg/dL — ABNORMAL HIGH (ref 70–99)
Potassium: 3.1 mEq/L — ABNORMAL LOW (ref 3.5–5.1)
Total Bilirubin: 2.1 mg/dL — ABNORMAL HIGH (ref 0.3–1.2)

## 2012-12-30 LAB — GLUCOSE, CAPILLARY
Glucose-Capillary: 145 mg/dL — ABNORMAL HIGH (ref 70–99)
Glucose-Capillary: 169 mg/dL — ABNORMAL HIGH (ref 70–99)

## 2012-12-30 LAB — PROTIME-INR
INR: 2.14 — ABNORMAL HIGH (ref 0.00–1.49)
Prothrombin Time: 23.2 seconds — ABNORMAL HIGH (ref 11.6–15.2)

## 2012-12-30 MED ORDER — POTASSIUM CHLORIDE CRYS ER 20 MEQ PO TBCR
40.0000 meq | EXTENDED_RELEASE_TABLET | Freq: Two times a day (BID) | ORAL | Status: DC
  Administered 2012-12-30 – 2012-12-31 (×3): 40 meq via ORAL
  Filled 2012-12-30 (×4): qty 2

## 2012-12-30 NOTE — Evaluation (Signed)
Occupational Therapy Evaluation Patient Details Name: Edson Deridder MRN: [id number] DOB: [birth year] Today's Date: 12/30/2012 Time: 4132-4401 OT Time Calculation (min): 27 min  OT Assessment / Plan / Recommendation History of present illness pt was admitted with acute encephalopathy.  He has had several admissions recently.     Clinical Impression   Pt was admitted for the above.  He has had several rehab admissions after hospitalizations and was at home prior to admission.  Pt will benefit from skilled OT to increase safety and independence with adls with mod I level goals in acute.       OT Assessment  Patient needs continued OT Services    Follow Up Recommendations  Supervision/Assistance - 24 hour;SNF (depending upon progress)    Barriers to Discharge      Equipment Recommendations    None by OT   Recommendations for Other Services    Frequency  Min 2X/week    Precautions / Restrictions Precautions Precautions: Fall Restrictions Weight Bearing Restrictions: No   Pertinent Vitals/Pain No c/o pain    ADL  Grooming: Supervision/safety Where Assessed - Grooming: Supported standing Toilet Transfer: Supervision/safety Statistician Method: Sit to Barista: Grab bars;Comfort height toilet Equipment Used: Rolling walker Transfers/Ambulation Related to ADLs: pt performed bed mobility with supervision, using rail.  Min guard for safety when walking to bathroom to manage environment ADL Comments: pt usually uses AE for adls:  overall set up for adl with AE    OT Diagnosis: Generalized weakness;Cognitive deficits  OT Problem List: Decreased strength;Decreased cognition;Decreased safety awareness OT Treatment Interventions: Self-care/ADL training;Energy conservation;Cognitive remediation/compensation;Patient/family education   OT Goals(Current goals can be found in the care plan section) Acute Rehab OT Goals Patient Stated Goal: states he is supposed  to start with pace in 2 weeks OT Goal Formulation: With patient Time For Goal Achievement: 01/13/13 Potential to Achieve Goals: Good ADL Goals Pt Will Transfer to Toilet: with modified independence;ambulating;bedside commode Additional ADL Goal #1: pt will gather clothes and perform adl at mod I level with AE  Visit Information  Last OT Received On: 12/30/12 Assistance Needed: +1 History of Present Illness: pt was admitted with acute encephalopathy.  He has had several admissions recently.         Prior Functioning     Home Living Family/patient expects to be discharged to:: Unsure Living Arrangements: Alone Home Equipment: Walker - 4 wheels;Tub bench;Shower seat;Grab bars - tub/shower;Bedside commode Additional Comments: children check in several times a day and help as needed Prior Function Level of Independence: Needs assistance (some meals) Communication Communication: No difficulties         Vision/Perception     Cognition  Cognition Arousal/Alertness: Awake/alert Behavior During Therapy: WFL for tasks assessed/performed Overall Cognitive Status: Impaired/Different from baseline Area of Impairment: Memory;Safety/judgement;Attention;Orientation (oriented to mo/yr, place) General Comments: pt bumps into door with RW--denies visual problems; very talkative--gets distracted.  Uses the wrong word at times    Extremity/Trunk Assessment Upper Extremity Assessment Upper Extremity Assessment: Generalized weakness (L shoulder tight around 120)     Mobility Bed Mobility Supine to Sit: 6: Modified independent (Device/Increase time);With rails Transfers Sit to Stand: 7: Independent Stand to Sit: 7: Independent     Exercise     Balance Balance Balance Assessed: Yes Static Standing Balance Static Standing - Balance Support: Bilateral upper extremity supported Static Standing - Level of Assistance: 6: Modified independent (Device/Increase time) Static Standing -  Comment/# of Minutes: 1   End of Session OT -  End of Session Activity Tolerance: Patient tolerated treatment well Patient left: in bed;with call bell/phone within reach  GO     Kaitrin Seybold 12/30/2012, 2:55 PM Marica Otter, OTR/L [phone] 12/30/2012

## 2012-12-30 NOTE — Progress Notes (Signed)
[facility] PROGRESS NOTE  Tony Boyd ZOX:[id number] DOB: [birth year] DOA: 12/28/2012 PCP: [name], DO  Assessment/Plan  1. Acute hepatic metabolic encephalopathy-prior MELD score of 31-patient has had at least 6-7 admissions this year with the same issue.  - Spoke with Dr. Renato Gails.  Pt needs 24 hour supervision.  He has had several admissions to SNF over the last year, however, he improves when he is compliant with his medications and then gets discharged to home where he becomes encephalopathic. -  ALF may be a good option -  Additionally, his MELD score is 20 and his Child score is 12-13 (35-45% 1-year survival rate).  Given his recurrent encephalopathy while on lactulose and rifaximin, he may be a candidate for hospice.   2. History hepatitis C-quantitative HCV = 34,479 10/22.   Defer evaluation for Sofusbuvir to outpatient management. 3. Hypervolemic hyponatremia secondary to cirrhosis -continue lasix and Aldactone to 150 mg daily 4. Hypertension -  Nadolol decreased to 20mg  due to hypotension 5. Coagulopathy of the related to cirrhosis-monitor platelet count-no action needed currently as above 20.  SCDs 6. Diabetes mellitus-A1c 5.3 10/06/2012 diet controlled. 7. Seizure disorder likely alcohol related-continue Keppra 500 twice a day 8. Microcytic anemia-monitor closely the labs 9. Severe protein energy malnutrition-Albumin 2.3.    10. Hypokalemia-secondary diuresis-replace it if below 3.0  Diet:  Hepatic diet Access:  PIV IVF:  oFF Proph:  SCD  Code Status: partial code Family Communication: spoke to patient and left message for daughter Disposition Plan: pending evaluation by PT/OT.  Will converse with palliative care about possibility of hospice and goals of care conversation if daughter amenable   Consultants:  None  Procedures:  CXR  Antibiotics:  none   HPI/Subjective:  Patient states that he feels a little clearer today, but still sleepy.  He is  still confused about details and the day of the week.    Objective: Filed Vitals:   12/29/12 0652 12/29/12 0820 12/29/12 1426 12/29/12 2038  BP: 96/48 98/59 109/63 106/65  Pulse: 76 77 92 73  Temp: 98 F (36.7 C)  98.4 F (36.9 C) 98.2 F (36.8 C)  TempSrc: Oral  Oral Oral  Resp: 18 22 20 18   Height:      Weight:      SpO2: 93%  98% 96%    Intake/Output Summary (Last 24 hours) at 12/30/12 1241 Last data filed at 12/29/12 1531  Gross per 24 hour  Intake    360 ml  Output      0 ml  Net    360 ml   Filed Weights   12/28/12 2142  Weight: 103.8 kg (228 lb 13.4 oz)    Exam:   General:  CM, No acute distress, asleep but easily arousable  HEENT:  NCAT, MMM  Cardiovascular:  RRR, nl S1, S2 no mrg, 2+ pulses, warm extremities  Respiratory:  CTAB, no increased WOB  Abdomen:   NABS, soft, NT/ND  MSK:   Normal tone and bulk, 1+ bilateral LEE  Neuro:  Grossly intact  Psych:  Asleep but easily arouseable, somewhat slow to respond, confused about details, oriented to person, place, but not time  Data Reviewed: Basic Metabolic Panel:  Recent Labs Lab 12/27/12 1650 12/28/12 1551 12/28/12 1630 12/29/12 1430 12/30/12 0345  NA 124* 128*  --  130* 133*  K 2.8* 3.2*  --  2.9* 3.1*  CL 86* 91*  --  91* 95*  CO2 33* 31  --  34* 33*  GLUCOSE 166* 195*  --  158* 153*  BUN 18 19  --  19 20  CREATININE 0.80 0.99  --  1.05 1.14  CALCIUM 8.3* 8.8  --  8.9 8.8  MG  --   --  1.6  --   --    Liver Function Tests:  Recent Labs Lab 12/27/12 1650 12/28/12 1551 12/29/12 1430 12/30/12 0345  AST 51* 47* 48* 52*  ALT 23 22 22 23   ALKPHOS 155* 167* 157* 163*  BILITOT 3.1* 2.4* 2.5* 2.1*  PROT 6.4 6.7 6.6 6.5  ALBUMIN 2.2* 2.3* 2.3* 2.2*    Recent Labs Lab 12/28/12 1551  LIPASE 49    Recent Labs Lab 12/28/12 1630 12/29/12 0600  AMMONIA 181* 84*   CBC:  Recent Labs Lab 12/27/12 1650 12/28/12 1551 12/29/12 1430  WBC 4.5 3.7* 4.8  NEUTROABS 3.4 2.7 3.1   HGB 10.7* 10.9* 10.9*  HCT 29.4* 30.1* 30.7*  MCV 98.3 99.3 100.0  PLT 36* 34* 41*   Cardiac Enzymes:  Recent Labs Lab 12/28/12 1551  TROPONINI <0.30   BNP (last 3 results)  Recent Labs  12/03/12 1435  PROBNP 297.7*   CBG:  Recent Labs Lab 12/29/12 0740 12/29/12 1127 12/29/12 1625 12/30/12 0806 12/30/12 1142  GLUCAP 160* 211* 117* 148* 230*    Recent Results (from the past 240 hour(s))  URINE CULTURE     Status: None   Collection Time    12/27/12  4:55 PM      Result Value Range Status   Specimen Description URINE, CLEAN CATCH   Final   Special Requests Normal   Final   Culture  Setup Time     Final   Value: 12/27/2012 23:51     Performed at [facility] Count     Final   Value: 3,000 COLONIES/ML     Performed at [facility]   Culture     Final   Value: INSIGNIFICANT GROWTH     Performed at [facility]   Report Status 12/29/2012 FINAL   Final     Studies: No results found.  Scheduled Meds: . feeding supplement (ENSURE COMPLETE)  237 mL Oral BID BM  . ferrous sulfate  325 mg Oral QHS  . folic acid  1 mg Oral Daily  . furosemide  60 mg Oral Daily  . insulin aspart  6 Units Subcutaneous TID WC  . insulin glargine  20 Units Subcutaneous QHS  . lactulose  30 g Oral Q4H  . levETIRAcetam  500 mg Oral BID  . levothyroxine  75 mcg Oral Daily  . nadolol  20 mg Oral Daily  . oxyCODONE  10 mg Oral Q4H  . pantoprazole  40 mg Oral Daily  . PARoxetine  20 mg Oral Daily  . potassium chloride SA  40 mEq Oral BID  . rifaximin  550 mg Oral BID  . risperiDONE  0.25 mg Oral BID  . 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 If 7PM-7AM, please contact night-coverage  at [email or web address], password [facility] 12/30/2012, 12:41 PM  LOS: 2 days

## 2012-12-31 DIAGNOSIS — E1165 Type 2 diabetes mellitus with hyperglycemia: Secondary | ICD-10-CM

## 2012-12-31 LAB — GLUCOSE, CAPILLARY
Glucose-Capillary: 175 mg/dL — ABNORMAL HIGH (ref 70–99)
Glucose-Capillary: 206 mg/dL — ABNORMAL HIGH (ref 70–99)

## 2012-12-31 NOTE — Progress Notes (Signed)
Occupational Therapy Treatment Patient Details Name: Tony Boyd MRN: [id number] DOB: [birth year] Today's Date: 12/31/2012 Time: 4098-1191 OT Time Calculation (min): 19 min  OT Assessment / Plan / Recommendation  History of present illness pt was admitted with acute encephalopathy.  He has had several admissions recently.     OT comments  Pt up to bathroom and discussed safety with RW use and set up of bathroom environment at home. Feel pt will need 24/7 supervision for safety. No family present to confirm d/c plan or assist available.   Follow Up Recommendations  Supervision/Assistance - 24 hour;SNF (depending on progress)    Barriers to Discharge       Equipment Recommendations  None recommended by OT    Recommendations for Other Services    Frequency Min 2X/week   Progress towards OT Goals Progress towards OT goals: Progressing toward goals  Plan Discharge plan remains appropriate    Precautions / Restrictions Precautions Precautions: Fall Restrictions Weight Bearing Restrictions: No   Pertinent Vitals/Pain 810 back pain; nursing made aware    ADL  Toilet Transfer: Performed;Supervision/safety Toilet Transfer Equipment: Comfort height toilet;Grab bars ADL Comments: Pt gets easily distracted. Needs cues for safety especially with manuevering walker in room and in tighter spaces. Pt with own method of using paper towels to wipe after toileting and then use of toilet paper.  Pt states he uses a  shelf rack to hold to at home to get up from commode. Discussed use of 3in1 for UE support rather than rack for more stability/safety. Pt stating, "that will just get in my way" referring to 3in1.     OT Diagnosis:    OT Problem List:   OT Treatment Interventions:     OT Goals(current goals can now be found in the care plan section)    Visit Information  Last OT Received On: 12/31/12 Assistance Needed: +1 History of Present Illness: pt was admitted with acute encephalopathy.   He has had several admissions recently.      Subjective Data      Prior Functioning       Cognition  Cognition Arousal/Alertness: Awake/alert Behavior During Therapy: WFL for tasks assessed/performed Overall Cognitive Status: Impaired/Different from baseline Area of Impairment: Safety/judgement;Attention General Comments: easily distracted. noted using wrong word at times and slightly increased time to express thought    Mobility       Exercises      Balance     End of Session OT - End of Session Equipment Utilized During Treatment: Rolling walker Activity Tolerance: Patient tolerated treatment well Patient left: in bed;with call bell/phone within reach;with bed alarm set  GO     Lennox Laity [phone] 12/31/2012, 11:54 AM

## 2012-12-31 NOTE — ED Provider Notes (Signed)
Medical screening examination/treatment/procedure(s) were conducted as a shared visit with non-physician practitioner(s) and myself.  I personally evaluated the patient during the encounter.   Please see my separate note.     Candyce Churn, MD 12/31/12 [phone]

## 2012-12-31 NOTE — Discharge Summary (Signed)
Physician Discharge Summary  Tony Boyd HYQ:[id number] DOB: 1955-06-28 DOA: 12/28/2012  PCP: [name], DO  Admit date: 12/28/2012 Discharge date: 12/31/2012  Time spent: 45 minutes  Recommendations for Outpatient Follow-up:  -Advised to follow up with PCP in 2 weeks. -Have encouraged him to be compliant with lactulose and rifaximin.   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 male who is brought in by his daughter from his ILF with increasing confusion over the past few days. Patient is intermittently confused and not able to contribute a whole lot to his history. Per his daughter he has been having intermittent confusion over the past few days. Patient states he is taking his lactulose 5 times a day at home. He did have nausea and vomiting to day he states and a "little diarrhea". No blood in vomit, no melena, no blood in stool.  Work up in the ED is c/w hepatic encephalopathy with the patient demonstrating classic signs of asterixis and a markedly elevated ammonia level of 181. We were asked to admit him for further evaluation and amanagement.   Hospital Course:  1. Acute hepatic metabolic encephalopathy-prior MELD score of 31-patient has had at least 6-7 admissions this year with the same issue.  - Spoke with Dr. Renato Gails. Pt needs 24 hour supervision. He has had several admissions to SNF over the last year, however, he improves when he is compliant with his medications and then gets discharged to home where he becomes  encephalopathic. - ALF may be a good option; however he is competent and refuses this. - History hepatitis C-quantitative HCV = 34,479 10/22. Defer evaluation for treatment to the outpatient setting. 2. Hypervolemic hyponatremia secondary to cirrhosis -continue lasix and Aldactone to 100 mg daily 3. Hypertension - Well controlled. 4. Coagulopathy of the related to cirrhosis-monitor platelet count-no action needed currently as above 20. SCDs 5. Diabetes mellitus-A1c 5.3 10/06/2012 diet controlled. 6. Seizure disorder likely alcohol related-continue Keppra 500 twice a day 7. Microcytic anemia-monitor closely the labs 8. Severe protein energy malnutrition-Albumin 2.3.  9. Hypokalemia-secondary diuresis-replace it if below 3.0     Procedures:  None    Consultations:  none  Discharge Instructions  Discharge Orders   Future Appointments Provider Department Dept Phone   01/21/2013 8:45 AM Louis Meckel, MD [facility] [phone]   Future Orders Complete By Expires   Diet - low sodium heart healthy  As directed    Discontinue IV  As directed    Increase activity slowly  As directed        Medication List    STOP taking these medications       metolazone 5 MG tablet  Commonly known as:  ZAROXOLYN      TAKE these medications       albuterol (5 MG/ML) 0.5% nebulizer solution  Commonly known as:  PROVENTIL  Take 0.5 mLs (2.5 mg total) by nebulization every 4 (four) hours as needed for wheezing or shortness of breath.  alum & mag hydroxide-simeth 200-200-20 MG/5ML suspension  Commonly known as:  MAALOX/MYLANTA  Take 30 mLs by mouth every 6 (six) hours as needed.     ferrous sulfate 325 (65 FE) MG tablet  Take 325 mg by mouth at bedtime.     folic acid 1 MG tablet  Commonly known as:  FOLVITE  Take 1 mg by mouth daily.     furosemide 20 MG tablet  Commonly known as:  LASIX  Take 3 tablets (60 mg total) by mouth 2 (two) times daily.      insulin aspart 100 UNIT/ML injection  Commonly known as:  novoLOG  Inject 6 Units into the skin 3 (three) times daily with meals.     insulin glargine 100 UNIT/ML injection  Commonly known as:  LANTUS  Inject 20 Units into the skin at bedtime.     ipratropium 0.02 % nebulizer solution  Commonly known as:  ATROVENT  Take 2.5 mLs (0.5 mg total) by nebulization every 4 (four) hours as needed.     KLOR-CON M20 20 MEQ tablet  Generic drug:  potassium chloride SA  TAKE 1 TABLET BY MOUTH TWICE A DAY     lactulose 10 GM/15ML solution  Commonly known as:  CHRONULAC  Take 45 mLs (30 g total) by mouth 5 (five) times daily.     levETIRAcetam 500 MG tablet  Commonly known as:  KEPPRA  TAKE 1 TABLET BY MOUTH EVERY 12 HOURS     levothyroxine 75 MCG tablet  Commonly known as:  SYNTHROID, LEVOTHROID  Take 75 mcg by mouth daily.     nadolol 40 MG tablet  Commonly known as:  CORGARD  Take 1 tablet (40 mg total) by mouth daily.     omeprazole 20 MG capsule  Commonly known as:  PRILOSEC  Take 20 mg by mouth daily.     ondansetron 4 MG tablet  Commonly known as:  ZOFRAN  Take 1 tablet (4 mg total) by mouth every 6 (six) hours as needed for nausea.     Oxycodone HCl 10 MG Tabs  Take 1 tablet (10 mg total) by mouth every 4 (four) hours.     PARoxetine 20 MG tablet  Commonly known as:  PAXIL  Take 1 tablet (20 mg total) by mouth daily.     rifaximin 550 MG Tabs tablet  Commonly known as:  XIFAXAN  Take 550 mg by mouth 2 (two) times daily.     risperiDONE 0.25 MG tablet  Commonly known as:  RISPERDAL  Take 0.25 mg by mouth 2 (two) times daily.     spironolactone 100 MG tablet  Commonly known as:  ALDACTONE  Take 100 mg by mouth daily.       Allergies  Allergen Reactions  . Ativan [Lorazepam] Other (See Comments)    "hallucinations"  . Droperidol Other (See Comments)    unknown  . Ketorolac Other (See Comments)    unknown  . Penicillins Swelling and Other (See Comments)     unkown  . Toradol [Ketorolac Tromethamine] Hives       Follow-up Information   Follow up with REED, TIFFANY, DO. Schedule an appointment as soon as possible for a visit in 2 weeks.   Specialty:  Geriatric Medicine   Contact information:   [address] [place] [address] [phone]        The results of significant diagnostics from this hospitalization (including imaging, microbiology, ancillary and laboratory) are listed below for reference.  Significant Diagnostic Studies: Dg Chest 2 View  12/27/2012   CLINICAL DATA:  Flank pain former smoker, altered mental status, history cirrhosis, hepatitis-C, seizure disorder, diabetes  EXAM: CHEST  2 VIEW  COMPARISON:  12/03/2012  FINDINGS: Enlargement of cardiac silhouette with left ventricular prominence.  Mediastinal contours and pulmonary vascularity normal.  Right arm PICC line tip projects over SVC above cavoatrial junction.  Question new anterior lung base opacity/ infiltrate on lateral view.  Upper lungs clear.  No pleural effusion or pneumothorax.  Bones unremarkable.  IMPRESSION: Enlargement of cardiac silhouette.  Question new anterior basilar lung infiltrate on lateral view, potentially lingula.   Electronically Signed   By: Ulyses Southward M.D.   On: 12/27/2012 17:26   Dg Chest 2 View  12/03/2012   CLINICAL DATA:  Hepatitis-C. Fatigue. Short of breath.  EXAM: CHEST  2 VIEW  COMPARISON:  11/12/2012  FINDINGS: Lungs are under aerated with bibasilar atelectasis. Mild cardiomegaly and vascular congestion without evidence of interstitial edema. No pleural effusion. No pneumothorax.  IMPRESSION: Mild cardiomegaly and vascular congestion without edema.  Bibasilar atelectasis.   Electronically Signed   By: Maryclare Bean M.D.   On: 12/03/2012 15:20   US Abdomen Limited  12/04/2012   CLINICAL DATA:  Cirrhosis, assessment for ascites and drainage  EXAM: LIMITED ABDOMEN ULTRASOUND FOR ASCITES  TECHNIQUE: Limited ultrasound survey for ascites was  performed in all four abdominal quadrants.  COMPARISON:  10/15/2012  FINDINGS: No significant ascites identified.  Paracentesis not performed.  IMPRESSION: No significant ascites identified.   Electronically Signed   By: Ulyses Southward M.D.   On: 12/04/2012 14:16   Ir Fluoro Guide Cv Line Left  12/09/2012   *RADIOLOGY REPORT*  Clinical Data: Chronic liver cirrhosis, poor venous access.  POWERED PICC LINE PLACEMENT WITH ULTRASOUND AND FLUOROSCOPIC GUIDANCE  Fluoroscopy Time: 0 minutes, 12 seconds  The right arm was prepped with chlorhexidine, draped in the usual sterile fashion using maximum barrier technique (cap and mask, sterile gown, sterile gloves, large sterile sheet, hand hygiene and cutaneous antisepsis) and infiltrated locally with 1% Lidocaine.  Ultrasound demonstrated patency of the right brachial vein, and this was documented with an image.  Under real-time ultrasound guidance, this vein was accessed with a 21 gauge micropuncture needle and image documentation was performed.  The needle was exchanged over a guidewire for a peel-away sheath through which a five [place] dual lumen PICC trimmed to 41 cm was advanced, positioned with its tip at the lower SVC/right atrial junction. Fluoroscopy during the procedure and fluoro spot radiograph confirms appropriate catheter position.  The catheter was flushed, secured to the skin with Prolene sutures, and covered with a sterile dressing.  Complications:  None  IMPRESSION: Successful right arm poweredPICC line placement with ultrasound and fluoroscopic guidance.  The catheter is ready for use.  Read by: Brayton El, P.A,-C   Original Report Authenticated By: Tacey Ruiz, MD   Ir US Guide Vasc Access Right  12/09/2012   *RADIOLOGY REPORT*  Clinical Data: Chronic liver cirrhosis, poor venous access.  POWERED PICC LINE PLACEMENT WITH ULTRASOUND AND FLUOROSCOPIC GUIDANCE  Fluoroscopy Time: 0 minutes, 12 seconds  The right arm was prepped with chlorhexidine,  draped in the usual sterile fashion using maximum barrier technique (cap and mask, sterile gown, sterile gloves, large sterile sheet, hand hygiene and cutaneous antisepsis) and infiltrated locally with 1% Lidocaine.  Ultrasound demonstrated patency of the right brachial vein, and this was documented with an image.  Under  real-time ultrasound guidance, this vein was accessed with a 21 gauge micropuncture needle and image documentation was performed.  The needle was exchanged over a guidewire for a peel-away sheath through which a five [place] dual lumen PICC trimmed to 41 cm was advanced, positioned with its tip at the lower SVC/right atrial junction. Fluoroscopy during the procedure and fluoro spot radiograph confirms appropriate catheter position.  The catheter was flushed, secured to the skin with Prolene sutures, and covered with a sterile dressing.  Complications:  None  IMPRESSION: Successful right arm poweredPICC line placement with ultrasound and fluoroscopic guidance.  The catheter is ready for use.  Read by: Brayton El, P.A,-C   Original Report Authenticated By: Tacey Ruiz, MD    Microbiology: Recent Results (from the past 240 hour(s))  URINE CULTURE     Status: None   Collection Time    12/27/12  4:55 PM      Result Value Range Status   Specimen Description URINE, CLEAN CATCH   Final   Special Requests Normal   Final   Culture  Setup Time     Final   Value: 12/27/2012 23:51     Performed at [facility]   Colony Count     Final   Value: 3,000 COLONIES/ML     Performed at [facility]   Culture     Final   Value: INSIGNIFICANT GROWTH     Performed at [facility]   Report Status 12/29/2012 FINAL   Final     Labs: Basic Metabolic Panel:  Recent Labs Lab 12/27/12 1650 12/28/12 1551 12/28/12 1630 12/29/12 1430 12/30/12 0345  NA 124* 128*  --  130* 133*  K 2.8* 3.2*  --  2.9* 3.1*  CL 86* 91*  --  91* 95*  CO2 33* 31  --  34* 33*  GLUCOSE 166*  195*  --  158* 153*  BUN 18 19  --  19 20  CREATININE 0.80 0.99  --  1.05 1.14  CALCIUM 8.3* 8.8  --  8.9 8.8  MG  --   --  1.6  --   --    Liver Function Tests:  Recent Labs Lab 12/27/12 1650 12/28/12 1551 12/29/12 1430 12/30/12 0345  AST 51* 47* 48* 52*  ALT 23 22 22 23   ALKPHOS 155* 167* 157* 163*  BILITOT 3.1* 2.4* 2.5* 2.1*  PROT 6.4 6.7 6.6 6.5  ALBUMIN 2.2* 2.3* 2.3* 2.2*    Recent Labs Lab 12/28/12 1551  LIPASE 49    Recent Labs Lab 12/28/12 1630 12/29/12 0600  AMMONIA 181* 84*   CBC:  Recent Labs Lab 12/27/12 1650 12/28/12 1551 12/29/12 1430  WBC 4.5 3.7* 4.8  NEUTROABS 3.4 2.7 3.1  HGB 10.7* 10.9* 10.9*  HCT 29.4* 30.1* 30.7*  MCV 98.3 99.3 100.0  PLT 36* 34* 41*   Cardiac Enzymes:  Recent Labs Lab 12/28/12 1551  TROPONINI <0.30   BNP: BNP (last 3 results)  Recent Labs  12/03/12 1435  PROBNP 297.7*   CBG:  Recent Labs Lab 12/30/12 1142 12/30/12 1631 12/30/12 2224 12/31/12 0808 12/31/12 1142  GLUCAP 230* 169* 116* 168* 175*       Signed:  HERNANDEZ ACOSTA,ESTELA  [facility] Pager: [phone] 12/31/2012, 2:38 PM

## 2012-12-31 NOTE — Evaluation (Signed)
Physical Therapy Evaluation Patient Details Name: Tony Boyd MRN: [id number] DOB: 1956-02-27 Today's Date: 12/31/2012 Time: 6213-0865 PT Time Calculation (min): 13 min  PT Assessment / Plan / Recommendation History of Present Illness  pt was admitted with acute encephalopathy.  He has had several admissions recently.    Clinical Impression  Pt with multiple recent admissions; He is close to baseline but has higher level balance issues; May benefit from SNF for return to I/incr safety at home    PT Assessment  Patient needs continued PT services    Follow Up Recommendations  SNF;Home health PT    Does the patient have the potential to tolerate intense rehabilitation      Barriers to Discharge        Equipment Recommendations  None recommended by PT    Recommendations for Other Services     Frequency Min 3X/week    Precautions / Restrictions Precautions Precautions: Fall Restrictions Weight Bearing Restrictions: No   Pertinent Vitals/Pain No c/o pain      Mobility  Bed Mobility Bed Mobility: Supine to Sit Supine to Sit: 6: Modified independent (Device/Increase time);With rails Transfers Transfers: Sit to Stand;Stand to Sit Sit to Stand: 6: Modified independent (Device/Increase time) Stand to Sit: 6: Modified independent (Device/Increase time) Ambulation/Gait Ambulation/Gait Assistance: 4: Min guard;5: Supervision Ambulation Distance (Feet): 240 Feet Assistive device: Rolling walker Ambulation/Gait Assistance Details: min/guard for safety,   and RW  direction Gait Pattern: Step-through pattern;Decreased stride length;Wide base of support    Exercises     PT Diagnosis: Difficulty walking  PT Problem List: Decreased balance;Decreased mobility PT Treatment Interventions: DME instruction;Gait training;Functional mobility training;Therapeutic activities;Therapeutic exercise;Patient/family education     PT Goals(Current goals can be found in the care plan  section) Acute Rehab PT Goals PT Goal Formulation: With patient Time For Goal Achievement: 01/14/13 Potential to Achieve Goals: Good  Visit Information  Last PT Received On: 12/31/12 Assistance Needed: +1 History of Present Illness: pt was admitted with acute encephalopathy.  He has had several admissions recently.         Prior Functioning  Home Living Family/patient expects to be discharged to:: Assisted living ([facility]) Living Arrangements: Alone Type of Home: Assisted living Home Equipment: Dan Humphreys - 4 wheels;Tub bench;Shower seat;Grab bars - tub/shower;Bedside commode Additional Comments: children check in several times a day and help as needed Prior Function Level of Independence: Needs assistance;Independent with assistive device(s) Communication Communication: No difficulties    Cognition  Cognition Arousal/Alertness: Awake/alert Behavior During Therapy: WFL for tasks assessed/performed Overall Cognitive Status: Impaired/Different from baseline Area of Impairment: Safety/judgement;Attention Memory: Decreased short-term memory Safety/Judgement: Decreased awareness of safety;Decreased awareness of deficits General Comments: easily distracted. noted using wrong word at times and slightly increased time to express thought    Extremity/Trunk Assessment Upper Extremity Assessment Upper Extremity Assessment: Generalized weakness Lower Extremity Assessment Lower Extremity Assessment: Generalized weakness   Balance Static Standing Balance Static Standing - Balance Support: Bilateral upper extremity supported;During functional activity Static Standing - Level of Assistance: 5: Stand by assistance;6: Modified independent (Device/Increase time)  End of Session PT - End of Session Equipment Utilized During Treatment: Gait belt Activity Tolerance: Patient tolerated treatment well Patient left: with call bell/phone within reach;in chair;with nursing/sitter in room Nurse  Communication: Mobility status  GP     [facility] 12/31/2012, 1:50 PM

## 2012-12-31 NOTE — Progress Notes (Addendum)
Discussed with patient the recommendation of PT of assisted living and/or snf for rehab but he declines and desires to return to [facility].  He states if he is discharged he will take a cab home .  Spoke with Marylu Lund CSW regarding need for cab voucher, she states she will be here to speak with him in [place]

## 2012-12-31 NOTE — Telephone Encounter (Signed)
RX was sent last week

## 2012-12-31 NOTE — Progress Notes (Signed)
Discharged patient home via [employer].  Notified his daughter Tamera Punt that he was on his way home, so she could meet him there, she verbalized her understanding

## 2013-01-02 ENCOUNTER — Telehealth: Payer: Self-pay | Admitting: *Deleted

## 2013-01-02 DIAGNOSIS — F319 Bipolar disorder, unspecified: Secondary | ICD-10-CM

## 2013-01-02 DIAGNOSIS — D638 Anemia in other chronic diseases classified elsewhere: Secondary | ICD-10-CM

## 2013-01-02 DIAGNOSIS — E119 Type 2 diabetes mellitus without complications: Secondary | ICD-10-CM

## 2013-01-02 DIAGNOSIS — K7030 Alcoholic cirrhosis of liver without ascites: Secondary | ICD-10-CM

## 2013-01-02 NOTE — Telephone Encounter (Signed)
[facility], Ronald Lobo, [place] [facility] Order to change dressings once a week. Ronald Lobo is going to fax an order for a Dr. To sign.

## 2013-01-07 ENCOUNTER — Ambulatory Visit: Payer: Medicare Other | Admitting: Nurse Practitioner

## 2013-01-08 ENCOUNTER — Encounter: Payer: Self-pay | Admitting: Nurse Practitioner

## 2013-01-08 ENCOUNTER — Ambulatory Visit (INDEPENDENT_AMBULATORY_CARE_PROVIDER_SITE_OTHER): Payer: Medicare Other | Admitting: Nurse Practitioner

## 2013-01-08 VITALS — BP 110/68 | HR 92 | Temp 97.4°F | Resp 18 | Wt 245.0 lb

## 2013-01-08 DIAGNOSIS — R110 Nausea: Secondary | ICD-10-CM

## 2013-01-08 DIAGNOSIS — K7460 Unspecified cirrhosis of liver: Secondary | ICD-10-CM

## 2013-01-08 DIAGNOSIS — D649 Anemia, unspecified: Secondary | ICD-10-CM

## 2013-01-08 DIAGNOSIS — K7290 Hepatic failure, unspecified without coma: Secondary | ICD-10-CM

## 2013-01-08 DIAGNOSIS — E1165 Type 2 diabetes mellitus with hyperglycemia: Secondary | ICD-10-CM

## 2013-01-08 DIAGNOSIS — R531 Weakness: Secondary | ICD-10-CM

## 2013-01-08 DIAGNOSIS — R5381 Other malaise: Secondary | ICD-10-CM

## 2013-01-08 MED ORDER — INSULIN ASPART 100 UNIT/ML ~~LOC~~ SOLN: SUBCUTANEOUS | Status: DC

## 2013-01-08 MED ORDER — INSULIN GLARGINE 100 UNIT/ML ~~LOC~~ SOLN: 10.0000 [IU] | Freq: Every day | SUBCUTANEOUS | Status: DC

## 2013-01-08 MED ORDER — LACTULOSE 10 GM/15ML PO SOLN: 30.0000 g | Freq: Every day | ORAL | Status: DC

## 2013-01-08 MED ORDER — ONDANSETRON HCL 4 MG PO TABS: 4.0000 mg | ORAL_TABLET | Freq: Four times a day (QID) | ORAL | Status: DC | PRN

## 2013-01-08 NOTE — Patient Instructions (Signed)
Will check blood work today You have new insulin orders (this is what you have actually been doing at home)  To follow up with Tony Boyd in 6 weeks

## 2013-01-08 NOTE — Progress Notes (Signed)
Patient ID: Tony Boyd, male   DOB: Jan 03, 1956, 57 y.o.   MRN: [id number]   Allergies  Allergen Reactions  . Ativan [Lorazepam] Other (See Comments)    "hallucinations"  . Droperidol Other (See Comments)    unknown  . Ketorolac Other (See Comments)    unknown  . Penicillins Swelling and Other (See Comments)    unkown  . Toradol [Ketorolac Tromethamine] Hives    Chief Complaint  Patient presents with  . Hospitalization Follow-up    hospital f/u  . other    has questions on the Rifaximin & Nadol,  if needs to be on them.  [name] other    colonoscopy not done to to risk per his GI doctor    HPI: Patient is a 57 y.o. male seen in the office today for hosptial follow up and for a face to face to be completed; Pt was brought in by his daughter to the ED with increasing confusion over several days. Work up in the ED was consistent with hepatic encephalopathy with the patient demonstrating classic signs of asterixis and a markedly elevated ammonia level of 181 and was admitted to the hospital for further evaluation and treatment; pt was hospitalized from  11/2-11/5 and is now been discharged back home on lactulose and rifaximin.  Lives alone; daughter with him at visit today, reports everything has been going good at home. Daughter says most the time he is worse after the hospital but this time he seems to be doing better.  Swelling in his legs have stayed down.  No concerns today; needed home health orders Review of Systems:  Review of Systems  Constitutional: Negative for fever and chills.  Respiratory: Negative for cough and shortness of breath.        Shortness of breath with exertion but this has improved  Cardiovascular: Negative for chest pain.  Gastrointestinal: Positive for nausea (comes and goes; not new) and diarrhea (and freq). Negative for heartburn, vomiting, abdominal pain and constipation.  Genitourinary: Negative for dysuria, urgency and frequency.  Musculoskeletal: Positive  for back pain and joint pain (knee and back pain which have been chronic).  Skin: Negative.   Neurological: Positive for weakness (but overall this has improved).  Psychiatric/Behavioral: Positive for memory loss. Negative for depression. The patient is not nervous/anxious.      Past Medical History  Diagnosis Date  . Cirrhosis of liver   . Hepatitis C   . ETOH abuse   . Hypothyroid   . Psychosis   . Bipolar disorder   . Seizures   . Arthritis   . Back pain   . Coagulopathy     Hx of  . Thrombocytopenia     Hx of  . Pancytopenia     Hx of  . H/O hypokalemia   . Hyponatremia     Hx of  . Korsakoff psychosis   . H/O abdominal abscess   . H/O esophageal varices   . Metabolic encephalopathy   . Hepatic encephalopathy   . Urinary urgency   . H/O renal failure   . H/O ascites   . Altered mental status   . Anemia   . Ascites   . Shortness of breath   . Peripheral vascular disease   . GERD (gastroesophageal reflux disease)   . Thrombocytopenia, acquired 06/02/2012  . Unspecified hypothyroidism 06/02/2012  . Pain in joint, pelvic region and thigh   . Edema   . Obesity, unspecified   . Chest pain, unspecified   .  Pneumonitis due to inhalation of food or vomitus   . Other convulsions   . Cellulitis and abscess of upper arm and forearm   . Chronic hepatitis C without mention of hepatic coma   . Hypopotassemia   . Anemia, unspecified   . Other and unspecified coagulation defects   . Thrombocytopenia, unspecified   . Bipolar I disorder, most recent episode (or current) unspecified   . Unspecified psychosis   . Encephalopathy   . Esophageal varices without mention of bleeding   . Esophagitis, unspecified   . Abscess of liver(572.0)   . Chronic kidney disease, unspecified   . Lumbago   . Personal history of alcoholism   . Personal history of tobacco use, presenting hazards to health   . Cirrhosis of liver without mention of alcohol   . Pneumonitis due to inhalation of  food or vomitus 09/19/2010  . Chronic hepatitis C without mention of hepatic coma   . Bipolar I disorder, most recent episode (or current) unspecified   . Unspecified psychosis   . Other ascites   . Personal history of tobacco use, presenting hazards to health   . Cirrhosis of liver without mention of alcohol   . Type 2 diabetes mellitus with diabetic neuropathy    Past Surgical History  Procedure Laterality Date  . Colostomy    . Cholecystectomy    . Small intestine surgery    . Arm surgery      left arm  . US guided drain placement in ventral hernia abscess  07/21/2010  . Multiple picc line placements    . Esophagogastroduodenoscopy  06/28/2011    Procedure: ESOPHAGOGASTRODUODENOSCOPY (EGD);  Surgeon: Louis Meckel, MD;  Location: [facility];  Service: Endoscopy;  Laterality: N/A;  . Esophagogastroduodenoscopy N/A 05/06/2012    Procedure: ESOPHAGOGASTRODUODENOSCOPY (EGD);  Surgeon: Louis Meckel, MD;  Location: [facility];  Service: Endoscopy;  Laterality: N/A;  . Esophageal banding N/A 05/06/2012    Procedure: ESOPHAGEAL BANDING;  Surgeon: Louis Meckel, MD;  Location: [facility];  Service: Endoscopy;  Laterality: N/A;   Social History:   reports that he quit smoking about 4 years ago. His smoking use included Cigarettes. He smoked 0.00 packs per day. He has never used smokeless tobacco. He reports that he does not drink alcohol or use illicit drugs.  Family History  Problem Relation Age of Onset  . Heart disease Mother     MI  . Lung cancer Brother     Medications: Patient's Medications  New Prescriptions   No medications on file  Previous Medications   ALBUTEROL (PROVENTIL) (5 MG/ML) 0.5% NEBULIZER SOLUTION    Take 0.5 mLs (2.5 mg total) by nebulization every 4 (four) hours as needed for wheezing or shortness of breath.   ALUM & MAG HYDROXIDE-SIMETH (MAALOX/MYLANTA) 200-200-20 MG/5ML SUSPENSION    Take 30 mLs by mouth every 6 (six) hours as needed.   FERROUS  SULFATE 325 (65 FE) MG TABLET    Take 325 mg by mouth at bedtime.   FOLIC ACID (FOLVITE) 1 MG TABLET    Take 1 mg by mouth daily.   FUROSEMIDE (LASIX) 20 MG TABLET    Take 3 tablets (60 mg total) by mouth 2 (two) times daily.   INSULIN ASPART (NOVOLOG) 100 UNIT/ML INJECTION    Inject 6 Units into the skin 3 (three) times daily with meals.   INSULIN GLARGINE (LANTUS) 100 UNIT/ML INJECTION    Inject 20 Units into the skin at bedtime.  KLOR-CON M20 20 MEQ TABLET    TAKE 1 TABLET BY MOUTH TWICE A DAY   LACTULOSE (CHRONULAC) 10 GM/15ML SOLUTION    Take 45 mLs (30 g total) by mouth 5 (five) times daily.   LEVETIRACETAM (KEPPRA) 500 MG TABLET    TAKE 1 TABLET BY MOUTH EVERY 12 HOURS   LEVOTHYROXINE (SYNTHROID, LEVOTHROID) 75 MCG TABLET    Take 75 mcg by mouth daily.   NADOLOL (CORGARD) 40 MG TABLET    Take 1 tablet (40 mg total) by mouth daily.   OMEPRAZOLE (PRILOSEC) 20 MG CAPSULE    Take 20 mg by mouth daily.   ONDANSETRON (ZOFRAN) 4 MG TABLET    Take 1 tablet (4 mg total) by mouth every 6 (six) hours as needed for nausea.   OXYCODONE HCL 10 MG TABS    Take 1 tablet (10 mg total) by mouth every 4 (four) hours.   PAROXETINE (PAXIL) 20 MG TABLET    Take 1 tablet (20 mg total) by mouth daily.   RIFAXIMIN (XIFAXAN) 550 MG TABS    Take 550 mg by mouth 2 (two) times daily.   RISPERIDONE (RISPERDAL) 0.25 MG TABLET    Take 0.25 mg by mouth 2 (two) times daily.   SPIRONOLACTONE (ALDACTONE) 100 MG TABLET    Take 100 mg by mouth daily.  Modified Medications   No medications on file  Discontinued Medications   IPRATROPIUM (ATROVENT) 0.02 % NEBULIZER SOLUTION    Take 2.5 mLs (0.5 mg total) by nebulization every 4 (four) hours as needed.     Physical Exam:  Filed Vitals:   01/08/13 1058  BP: 110/68  Pulse: 92  Temp: 97.4 F (36.3 C)  TempSrc: Oral  Resp: 18  Weight: 245 lb (111.131 kg)  SpO2: 99%    Physical Exam  Constitutional: He is well-developed, well-nourished, and in no distress. No  distress.  Cardiovascular: Normal rate, regular rhythm and normal heart sounds.   Pulmonary/Chest: Effort normal and breath sounds normal. No respiratory distress.  Abdominal: Soft. Bowel sounds are normal.  Musculoskeletal: He exhibits edema. He exhibits no tenderness.  Neurological: He is alert. Gait normal.  Skin: Skin is warm and dry. He is not diaphoretic.  jaundice   Psychiatric: Affect normal.    Labs reviewed: Basic Metabolic Panel:  Recent Labs  02/72/53 1135  09/17/12 0146  11/12/12 0235  12/10/12 0525  12/11/12 0515  12/28/12 1551 12/28/12 1630 12/29/12 1430 12/30/12 0345  NA  --   < > 120*  < > 127*  < > 127*  < > 124*  < > 128*  --  130* 133*  K  --   < > 4.0  < > 3.3*  < > 3.5  --  3.4*  < > 3.2*  --  2.9* 3.1*  CL  --   < > 89*  < > 89*  < > 95*  --  92*  < > 91*  --  91* 95*  CO2  --   < > 23  < > 31  < > 29  --  28  < > 31  --  34* 33*  GLUCOSE  --   < > 155*  < > 157*  < > 116*  --  98  < > 195*  --  158* 153*  BUN  --   < > 13  < > 12  < > 10  --  10  < > 19  --  19 20  CREATININE  --   < > 0.79  < > 0.75  < > 0.65  --  0.70  < > 0.99  --  1.05 1.14  CALCIUM  --   < > 8.5  < > 8.2*  < > 8.3*  --  8.2*  < > 8.8  --  8.9 8.8  MG  --   < >  --   < >  --   < > 1.7  --  1.5  --   --  1.6  --   --   TSH 2.323  --  2.243  --  3.470  --   --   --   --   --   --   --   --   --   < > = values in this interval not displayed. Liver Function Tests:  Recent Labs  12/28/12 1551 12/29/12 1430 12/30/12 0345  AST 47* 48* 52*  ALT 22 22 23   ALKPHOS 167* 157* 163*  BILITOT 2.4* 2.5* 2.1*  PROT 6.7 6.6 6.5  ALBUMIN 2.3* 2.3* 2.2*    Recent Labs  09/30/12 0454 10/06/12 1240 12/28/12 1551  LIPASE 47 33 49    Recent Labs  12/07/12 1604 12/28/12 1630 12/29/12 0600  AMMONIA 128* 181* 84*   CBC:  Recent Labs  12/27/12 1650 12/28/12 1551 12/29/12 1430  WBC 4.5 3.7* 4.8  NEUTROABS 3.4 2.7 3.1  HGB 10.7* 10.9* 10.9*  HCT 29.4* 30.1* 30.7*  MCV 98.3  99.3 100.0  PLT 36* 34* 41*   Lipid Panel: No results found for this basename: CHOL, HDL, LDLCALC, TRIG, CHOLHDL, LDLDIRECT,  in the last 8760 hours TSH:  Recent Labs  08/12/12 1135 09/17/12 0146 11/12/12 0235  TSH 2.323 2.243 3.470   A1C: No components found with this basename: A1C,     Assessment/Plan 1. Cirrhosis -to cont rifaximin twice daily - lactulose (CHRONULAC) 10 GM/15ML solution; Take 45 mLs (30 g total) by mouth 5 (five) times daily.  Dispense: 20812 mL; Refill: 11 - Ammonia level  2. Diabetes mellitus type 2, uncontrolled, with complications -will change pts medication in epic to how he has actually been receiving medication at home and check A1c - insulin glargine (LANTUS) 100 UNIT/ML injection; Inject 0.1 mLs (10 Units total) into the skin at bedtime.  Dispense: 10 mL; Refill: 2 - insulin aspart (NOVOLOG) 100 UNIT/ML injection; Pt to take 5 units of novolog if blood sugar is 150 or above with meals; No novolog if blood sugar under 150  Dispense: 1 vial; Refill: 3 - Hemoglobin A1c  3. Hepatic encephalopathy - more clear since he has been home; to cont rifaximin and lactulose - Comprehensive metabolic panel - Ammonia  4. Weakness generalized - form filled out for [facility] PT/OT  5. Normocytic anemia -follow up labs- CBC With differential/Platelet  6. Nausea alone -on and off; no new symptoms  - ondansetron (ZOFRAN) 4 MG tablet; Take 1 tablet (4 mg total) by mouth every 6 (six) hours as needed for nausea.  Dispense: 20 tablet; Refill: 0  Will have pt follow up with [facility] in ~ 6 weeks

## 2013-01-09 LAB — CBC WITH DIFFERENTIAL
Basos: 0 %
Eos: 4 %
Hemoglobin: 10.6 g/dL — ABNORMAL LOW (ref 12.6–17.7)
Immature Grans (Abs): 0 10*3/uL (ref 0.0–0.1)
Lymphocytes Absolute: 0.5 10*3/uL — ABNORMAL LOW (ref 0.7–3.1)
MCH: 34.9 pg — ABNORMAL HIGH (ref 26.6–33.0)
MCV: 102 fL — ABNORMAL HIGH (ref 79–97)
Monocytes Absolute: 0.4 10*3/uL (ref 0.1–0.9)
Monocytes: 11 %
Neutrophils Relative %: 71 %
RBC: 3.04 x10E6/uL — ABNORMAL LOW (ref 4.14–5.80)

## 2013-01-09 LAB — AMMONIA: Ammonia: 286 ug/dL (ref 27–102)

## 2013-01-09 LAB — COMPREHENSIVE METABOLIC PANEL
ALT: 24 IU/L (ref 0–44)
AST: 50 IU/L — ABNORMAL HIGH (ref 0–40)
BUN: 18 mg/dL (ref 6–24)
CO2: 28 mmol/L (ref 18–29)
Calcium: 8 mg/dL — ABNORMAL LOW (ref 8.7–10.2)
Chloride: 95 mmol/L — ABNORMAL LOW (ref 97–108)
GFR calc non Af Amer: 97 mL/min/{1.73_m2} (ref >59)
Glucose: 131 mg/dL — ABNORMAL HIGH (ref 65–99)
Potassium: 3.3 mmol/L — ABNORMAL LOW (ref 3.5–5.2)
Total Protein: 6.5 g/dL (ref 6.0–8.5)

## 2013-01-09 LAB — HEMOGLOBIN A1C
Est. average glucose Bld gHb Est-mCnc: 111 mg/dL
Hgb A1c MFr Bld: 5.5 % (ref 4.8–5.6)

## 2013-01-11 ENCOUNTER — Emergency Department (HOSPITAL_COMMUNITY): Payer: Medicare Other

## 2013-01-11 ENCOUNTER — Encounter (HOSPITAL_COMMUNITY): Payer: Self-pay | Admitting: Emergency Medicine

## 2013-01-11 ENCOUNTER — Inpatient Hospital Stay (HOSPITAL_COMMUNITY)
Admission: EM | Admit: 2013-01-11 | Discharge: 2013-01-13 | DRG: 432 | Disposition: A | Discharge status: Home Health | Payer: Medicare Other | Attending: Internal Medicine | Admitting: Internal Medicine

## 2013-01-11 DIAGNOSIS — B1921 Unspecified viral hepatitis C with hepatic coma: Secondary | ICD-10-CM | POA: Diagnosis present

## 2013-01-11 DIAGNOSIS — W19XXXA Unspecified fall, initial encounter: Secondary | ICD-10-CM

## 2013-01-11 DIAGNOSIS — W19XXXS Unspecified fall, sequela: Secondary | ICD-10-CM

## 2013-01-11 DIAGNOSIS — R5381 Other malaise: Secondary | ICD-10-CM | POA: Diagnosis present

## 2013-01-11 DIAGNOSIS — D696 Thrombocytopenia, unspecified: Secondary | ICD-10-CM

## 2013-01-11 DIAGNOSIS — K7682 Hepatic encephalopathy: Secondary | ICD-10-CM

## 2013-01-11 DIAGNOSIS — B1920 Unspecified viral hepatitis C without hepatic coma: Secondary | ICD-10-CM

## 2013-01-11 DIAGNOSIS — Z794 Long term (current) use of insulin: Secondary | ICD-10-CM

## 2013-01-11 DIAGNOSIS — IMO0002 Reserved for concepts with insufficient information to code with codable children: Secondary | ICD-10-CM

## 2013-01-11 DIAGNOSIS — K7290 Hepatic failure, unspecified without coma: Secondary | ICD-10-CM

## 2013-01-11 DIAGNOSIS — E669 Obesity, unspecified: Secondary | ICD-10-CM | POA: Diagnosis present

## 2013-01-11 DIAGNOSIS — I8500 Esophageal varices without bleeding: Secondary | ICD-10-CM

## 2013-01-11 DIAGNOSIS — D6959 Other secondary thrombocytopenia: Secondary | ICD-10-CM | POA: Diagnosis present

## 2013-01-11 DIAGNOSIS — Z6836 Body mass index (BMI) 36.0-36.9, adult: Secondary | ICD-10-CM

## 2013-01-11 DIAGNOSIS — R609 Edema, unspecified: Secondary | ICD-10-CM | POA: Diagnosis present

## 2013-01-11 DIAGNOSIS — R601 Generalized edema: Secondary | ICD-10-CM

## 2013-01-11 DIAGNOSIS — D689 Coagulation defect, unspecified: Secondary | ICD-10-CM | POA: Diagnosis present

## 2013-01-11 DIAGNOSIS — E1149 Type 2 diabetes mellitus with other diabetic neurological complication: Secondary | ICD-10-CM | POA: Diagnosis present

## 2013-01-11 DIAGNOSIS — E440 Moderate protein-calorie malnutrition: Secondary | ICD-10-CM

## 2013-01-11 DIAGNOSIS — K7030 Alcoholic cirrhosis of liver without ascites: Principal | ICD-10-CM | POA: Diagnosis present

## 2013-01-11 DIAGNOSIS — R4182 Altered mental status, unspecified: Secondary | ICD-10-CM | POA: Diagnosis present

## 2013-01-11 DIAGNOSIS — K7460 Unspecified cirrhosis of liver: Secondary | ICD-10-CM | POA: Diagnosis present

## 2013-01-11 DIAGNOSIS — Z87891 Personal history of nicotine dependence: Secondary | ICD-10-CM

## 2013-01-11 DIAGNOSIS — N5089 Other specified disorders of the male genital organs: Secondary | ICD-10-CM

## 2013-01-11 DIAGNOSIS — R531 Weakness: Secondary | ICD-10-CM | POA: Diagnosis present

## 2013-01-11 DIAGNOSIS — F319 Bipolar disorder, unspecified: Secondary | ICD-10-CM | POA: Diagnosis present

## 2013-01-11 DIAGNOSIS — E43 Unspecified severe protein-calorie malnutrition: Secondary | ICD-10-CM | POA: Diagnosis present

## 2013-01-11 DIAGNOSIS — F1010 Alcohol abuse, uncomplicated: Secondary | ICD-10-CM

## 2013-01-11 DIAGNOSIS — D638 Anemia in other chronic diseases classified elsewhere: Secondary | ICD-10-CM

## 2013-01-11 DIAGNOSIS — E1165 Type 2 diabetes mellitus with hyperglycemia: Secondary | ICD-10-CM

## 2013-01-11 DIAGNOSIS — G40909 Epilepsy, unspecified, not intractable, without status epilepticus: Secondary | ICD-10-CM

## 2013-01-11 DIAGNOSIS — E039 Hypothyroidism, unspecified: Secondary | ICD-10-CM

## 2013-01-11 LAB — COMPREHENSIVE METABOLIC PANEL
ALT: 28 U/L (ref 0–53)
AST: 66 U/L — ABNORMAL HIGH (ref 0–37)
Albumin: 2.3 g/dL — ABNORMAL LOW (ref 3.5–5.2)
Alkaline Phosphatase: 144 U/L — ABNORMAL HIGH (ref 39–117)
BUN: 12 mg/dL (ref 6–23)
Chloride: 101 mEq/L (ref 96–112)
GFR calc non Af Amer: >90 mL/min (ref >90)
Potassium: 4 mEq/L (ref 3.5–5.1)
Sodium: 137 mEq/L (ref 135–145)
Total Bilirubin: 3.2 mg/dL — ABNORMAL HIGH (ref 0.3–1.2)
Total Protein: 6.6 g/dL (ref 6.0–8.3)

## 2013-01-11 LAB — URINALYSIS, ROUTINE W REFLEX MICROSCOPIC
Glucose, UA: NEGATIVE mg/dL
Leukocytes, UA: NEGATIVE
Nitrite: NEGATIVE
Protein, ur: NEGATIVE mg/dL
Urobilinogen, UA: 0.2 mg/dL (ref 0.0–1.0)
pH: 6 (ref 5.0–8.0)

## 2013-01-11 LAB — BLOOD GAS, ARTERIAL
Acid-Base Excess: 2.8 mmol/L — ABNORMAL HIGH (ref 0.0–2.0)
Acid-Base Excess: 4.2 mmol/L — ABNORMAL HIGH (ref 0.0–2.0)
Bicarbonate: 28.1 mEq/L — ABNORMAL HIGH (ref 20.0–24.0)
Bicarbonate: 29.2 mEq/L — ABNORMAL HIGH (ref 20.0–24.0)
Drawn by: 31814
Drawn by: 318141
FIO2: 0.21 %
FIO2: 0.21 %
O2 Saturation: 59.2 %
O2 Saturation: 93.8 %
Patient temperature: 97.8
TCO2: 27 mmol/L (ref 0–100)
pO2, Arterial: 34.2 mmHg — CL (ref 80.0–100.0)
pO2, Arterial: 71.5 mmHg — ABNORMAL LOW (ref 80.0–100.0)

## 2013-01-11 LAB — CBC WITH DIFFERENTIAL/PLATELET
Basophils Relative: 0 % (ref 0–1)
Eosinophils Absolute: 0.2 10*3/uL (ref 0.0–0.7)
Eosinophils Relative: 5 % (ref 0–5)
Hemoglobin: 10.9 g/dL — ABNORMAL LOW (ref 13.0–17.0)
Lymphocytes Relative: 12 % (ref 12–46)
MCH: 34.4 pg — ABNORMAL HIGH (ref 26.0–34.0)
MCV: 100.9 fL — ABNORMAL HIGH (ref 78.0–100.0)
Neutrophils Relative %: 75 % (ref 43–77)
Platelets: 75 10*3/uL — ABNORMAL LOW (ref 150–400)
RBC: 3.17 MIL/uL — ABNORMAL LOW (ref 4.22–5.81)
RDW: 15.6 % — ABNORMAL HIGH (ref 11.5–15.5)

## 2013-01-11 LAB — PRO B NATRIURETIC PEPTIDE: Pro B Natriuretic peptide (BNP): 207.4 pg/mL — ABNORMAL HIGH (ref 0–125)

## 2013-01-11 LAB — TROPONIN I: Troponin I: <0.3 ng/mL (ref <0.30)

## 2013-01-11 LAB — AMMONIA: Ammonia: 38 umol/L (ref 11–60)

## 2013-01-11 NOTE — ED Notes (Signed)
Bed: ZO10 Expected date:  Expected time:  Means of arrival:  Comments: ems- confused, hx of high ammonia levels after drinking, drank yesterday

## 2013-01-11 NOTE — ED Notes (Signed)
From independent living, daughter found him on floor, suspected a un witnessed fall. Pt is confuse, hx of encephalopathy due to elevated ammonia level.  Pt has EXTENSIVELY Long medical histories. Appears somewhat confuse, speech hard to understand.

## 2013-01-11 NOTE — ED Notes (Signed)
Dr Loretha Stapler reviewed blood gas results and asked for a repeat test.  Respiratory advise

## 2013-01-11 NOTE — ED Notes (Signed)
First IV attempt fail. MD and CN attempting [place] IV

## 2013-01-11 NOTE — ED Provider Notes (Signed)
CSN: [id number]     Arrival date & time 01/11/13  1826 History   First MD Initiated Contact with Patient 01/11/13 1837     Chief Complaint  Patient presents with  . Fall   (Consider location/radiation/quality/duration/timing/severity/associated sxs/prior Treatment) HPI Comments: 57 yo male w hx of Cirrhosis and hepatic encephalopathy who presents with altered mental status. He is unable to provide history and no family is available. He reportedly was found on the floor, with possible fall. Level V caveat.    Patient is a 57 y.o. male presenting with altered mental status.  Altered Mental Status Presenting symptoms: confusion, lethargy and partial responsiveness   Severity:  Severe Most recent episode:  Today Episode history:  Unable to specify Timing:  Constant Chronicity:  Recurrent Context comment:  Hx of cirrhosis   Past Medical History  Diagnosis Date  . Cirrhosis of liver   . Hepatitis C   . ETOH abuse   . Hypothyroid   . Psychosis   . Bipolar disorder   . Seizures   . Arthritis   . Back pain   . Coagulopathy     Hx of  . Thrombocytopenia     Hx of  . Pancytopenia     Hx of  . H/O hypokalemia   . Hyponatremia     Hx of  . Korsakoff psychosis   . H/O abdominal abscess   . H/O esophageal varices   . Metabolic encephalopathy   . Hepatic encephalopathy   . Urinary urgency   . H/O renal failure   . H/O ascites   . Altered mental status   . Anemia   . Ascites   . Shortness of breath   . Peripheral vascular disease   . GERD (gastroesophageal reflux disease)   . Thrombocytopenia, acquired 06/02/2012  . Unspecified hypothyroidism 06/02/2012  . Pain in joint, pelvic region and thigh   . Edema   . Obesity, unspecified   . Chest pain, unspecified   . Pneumonitis due to inhalation of food or vomitus   . Other convulsions   . Cellulitis and abscess of upper arm and forearm   . Chronic hepatitis C without mention of hepatic coma   . Hypopotassemia   . Anemia,  unspecified   . Other and unspecified coagulation defects   . Thrombocytopenia, unspecified   . Bipolar I disorder, most recent episode (or current) unspecified   . Unspecified psychosis   . Encephalopathy   . Esophageal varices without mention of bleeding   . Esophagitis, unspecified   . Abscess of liver(572.0)   . Chronic kidney disease, unspecified   . Lumbago   . Personal history of alcoholism   . Personal history of tobacco use, presenting hazards to health   . Cirrhosis of liver without mention of alcohol   . Pneumonitis due to inhalation of food or vomitus 09/19/2010  . Chronic hepatitis C without mention of hepatic coma   . Bipolar I disorder, most recent episode (or current) unspecified   . Unspecified psychosis   . Other ascites   . Personal history of tobacco use, presenting hazards to health   . Cirrhosis of liver without mention of alcohol   . Type 2 diabetes mellitus with diabetic neuropathy    Past Surgical History  Procedure Laterality Date  . Colostomy    . Cholecystectomy    . Small intestine surgery    . Arm surgery      left arm  . US guided drain  placement in ventral hernia abscess  07/21/2010  . Multiple picc line placements    . Esophagogastroduodenoscopy  06/28/2011    Procedure: ESOPHAGOGASTRODUODENOSCOPY (EGD);  Surgeon: Louis Meckel, MD;  Location: [facility];  Service: Endoscopy;  Laterality: N/A;  . Esophagogastroduodenoscopy N/A 05/06/2012    Procedure: ESOPHAGOGASTRODUODENOSCOPY (EGD);  Surgeon: Louis Meckel, MD;  Location: [facility];  Service: Endoscopy;  Laterality: N/A;  . Esophageal banding N/A 05/06/2012    Procedure: ESOPHAGEAL BANDING;  Surgeon: Louis Meckel, MD;  Location: [facility];  Service: Endoscopy;  Laterality: N/A;   Family History  Problem Relation Age of Onset  . Heart disease Mother     MI  . Lung cancer Brother    History  Substance Use Topics  . Smoking status: Former Smoker    Types: Cigarettes    Quit  date: 02/15/2008  . Smokeless tobacco: Never Used  . Alcohol Use: No     Comment: Quit drinking 1 year ago.      Review of Systems  Unable to perform ROS: Mental status change  Psychiatric/Behavioral: Positive for confusion.    Allergies  Ativan; Droperidol; Ketorolac; Penicillins; and Toradol  Home Medications   Current Outpatient Rx  Name  Route  Sig  Dispense  Refill  . albuterol (PROVENTIL) (5 MG/ML) 0.5% nebulizer solution   Nebulization   Take 0.5 mLs (2.5 mg total) by nebulization every 4 (four) hours as needed for wheezing or shortness of breath.   20 mL   12   . alum & mag hydroxide-simeth (MAALOX/MYLANTA) 200-200-20 MG/5ML suspension   Oral   Take 30 mLs by mouth every 6 (six) hours as needed.   355 mL   0   . ferrous sulfate 325 (65 FE) MG tablet   Oral   Take 325 mg by mouth at bedtime.         . folic acid (FOLVITE) 1 MG tablet   Oral   Take 1 mg by mouth daily.         . furosemide (LASIX) 20 MG tablet   Oral   Take 3 tablets (60 mg total) by mouth 2 (two) times daily.   30 tablet   0   . insulin aspart (NOVOLOG) 100 UNIT/ML injection      Pt to take 5 units of novolog if blood sugar is 150 or above with meals; No novolog if blood sugar under 150   1 vial   3   . insulin glargine (LANTUS) 100 UNIT/ML injection   Subcutaneous   Inject 0.1 mLs (10 Units total) into the skin at bedtime.   10 mL   2   . KLOR-CON M20 20 MEQ tablet      TAKE 1 TABLET BY MOUTH TWICE A DAY   60 tablet   0   . lactulose (CHRONULAC) 10 GM/15ML solution   Oral   Take 45 mLs (30 g total) by mouth 5 (five) times daily.   20812 mL   11   . levETIRAcetam (KEPPRA) 500 MG tablet      TAKE 1 TABLET BY MOUTH EVERY 12 HOURS   60 tablet   0   . levothyroxine (SYNTHROID, LEVOTHROID) 75 MCG tablet   Oral   Take 75 mcg by mouth daily.         . nadolol (CORGARD) 40 MG tablet   Oral   Take 1 tablet (40 mg total) by mouth daily.   30 tablet   0   .  omeprazole (PRILOSEC) 20 MG capsule   Oral   Take 20 mg by mouth daily.         . ondansetron (ZOFRAN) 4 MG tablet   Oral   Take 1 tablet (4 mg total) by mouth every 6 (six) hours as needed for nausea.   20 tablet   0   . Oxycodone HCl 10 MG TABS   Oral   Take 1 tablet (10 mg total) by mouth every 4 (four) hours.   180 tablet   0   . PARoxetine (PAXIL) 20 MG tablet   Oral   Take 1 tablet (20 mg total) by mouth daily.   30 tablet   0   . rifaximin (XIFAXAN) 550 MG TABS   Oral   Take 550 mg by mouth 2 (two) times daily.         . risperiDONE (RISPERDAL) 0.25 MG tablet   Oral   Take 0.25 mg by mouth 2 (two) times daily.         [name] spironolactone (ALDACTONE) 100 MG tablet   Oral   Take 100 mg by mouth daily.          BP 118/57  Pulse 60  Temp(Src) 97.8 F (36.6 C) (Oral)  Resp 16  SpO2 98% Physical Exam  Nursing note and vitals reviewed. Constitutional: He appears well-developed and well-nourished. No distress.  HENT:  Head: Normocephalic and atraumatic.  Mouth/Throat: Oropharynx is clear and moist.  Eyes: Conjunctivae are normal. Pupils are equal, round, and reactive to light. No scleral icterus.  Neck: Neck supple.  Cardiovascular: Normal rate, regular rhythm, normal heart sounds and intact distal pulses.   No murmur heard. Pulmonary/Chest: Effort normal and breath sounds normal. No stridor. No respiratory distress. He has no wheezes. He has no rales.  Abdominal: Soft. He exhibits distension. There is no tenderness. There is no rigidity, no rebound and no guarding.  Musculoskeletal: Normal range of motion. He exhibits no edema.  Neurological: GCS eye subscore is 2. GCS verbal subscore is 4. GCS motor subscore is 6.  Obtunded.    Skin: Skin is warm and dry. No rash noted.  Psychiatric: He has a normal mood and affect. His behavior is normal.    ED Course  Procedures (including critical care time)  Emergency Ultrasound Study:   Angiocath  insertion Performed by: Warnell Forester  Consent: Verbal consent obtained. Risks and benefits: risks, benefits and alternatives were discussed Immediately prior to procedure the correct patient, procedure, equipment, support staff and site/side marked as needed.  Indication: difficult IV access Preparation: Patient was prepped and draped in the usual sterile fashion. Vein Location: left brachial vein was visualized during assessment for potential access sites and was found to be patent/ easily compressed with linear ultrasound.  The needle was visualized with real-time ultrasound and guided into the vein. Gauge: 20g  Image saved and stored.  Normal blood return.  Patient tolerance: Patient tolerated the procedure well with no immediate complications.      Labs Review Labs Reviewed  CBC WITH DIFFERENTIAL - Abnormal; Notable for the following:    RBC 3.17 (*)    Hemoglobin 10.9 (*)    HCT 32.0 (*)    MCV 100.9 (*)    MCH 34.4 (*)    RDW 15.6 (*)    Platelets 75 (*)    Lymphs Abs 0.6 (*)    All other components within normal limits  URINALYSIS, ROUTINE W REFLEX MICROSCOPIC - Abnormal; Notable for the  following:    Color, Urine AMBER (*)    All other components within normal limits  BLOOD GAS, ARTERIAL - Abnormal; Notable for the following:    pCO2 arterial 48.4 (*)    pO2, Arterial 34.2 (*)    Bicarbonate 28.1 (*)    Acid-Base Excess 2.8 (*)    All other components within normal limits  PRO B NATRIURETIC PEPTIDE - Abnormal; Notable for the following:    Pro B Natriuretic peptide (BNP) 207.4 (*)    All other components within normal limits  COMPREHENSIVE METABOLIC PANEL - Abnormal; Notable for the following:    Glucose, Bld 119 (*)    Calcium 8.3 (*)    Albumin 2.3 (*)    AST 66 (*)    Alkaline Phosphatase 144 (*)    Total Bilirubin 3.2 (*)    All other components within normal limits  BLOOD GAS, ARTERIAL - Abnormal; Notable for the following:    pCO2 arterial 47.7 (*)     pO2, Arterial 71.5 (*)    Bicarbonate 29.2 (*)    Acid-Base Excess 4.2 (*)    All other components within normal limits  ETHANOL  TROPONIN I  AMMONIA  CK   Imaging Review Ct Head Wo Contrast  01/11/2013   CLINICAL DATA:  Fall.  Altered mental status.  Unresponsive patient.  EXAM: CT HEAD WITHOUT CONTRAST  TECHNIQUE: Contiguous axial images were obtained from the base of the skull through the vertex without intravenous contrast.  COMPARISON:  11/12/2012  FINDINGS: The brainstem, cerebellum, cerebral peduncles, thalamus, basal ganglia, basilar cisterns, and ventricular system appear within normal limits. No intracranial hemorrhage, mass lesion, or acute CVA.  Mucus retention cyst in the left maxillary sinus, with bilateral mucoperiosteal thickening in both maxillary sinuses and in several ethmoid air cells. Left mastoid effusion noted. No middle ear fluid observed.  IMPRESSION: 1. Chronic paranasal sinusitis.  No acute intracranial findings. 2. Left mastoid effusion.   Electronically Signed   By: Herbie Baltimore M.D.   On: 01/11/2013 21:28   Dg Chest Port 1 View  01/11/2013   CLINICAL DATA:  Altered mental status  EXAM: PORTABLE CHEST - 1 VIEW  COMPARISON:  12/27/2012; 12/03/2012  FINDINGS: Grossly unchanged enlarged cardiac silhouette and mediastinal contours given persistently reduced lung volumes. Overall improved aeration of the lungs with persistent perihilar and left basilar opacities. No new focal airspace opacities. No definite pleural effusion or pneumothorax. No evidence of edema. Unchanged bones. Interval removal of right upper extremity approach PICC line.  IMPRESSION: Grossly unchanged findings of cardiomegaly and perihilar and left basilar opacities, atelectasis versus infiltrate. Further evaluation with a PA and lateral chest radiograph may be obtained as clinically indicated.   Electronically Signed   By: Simonne Come M.D.   On: 01/11/2013 20:04  All radiology studies independently  viewed by me.     EKG Interpretation   None       MDM   1. Altered mental status   2. Anemia of chronic disease   3. Cirrhosis   4. Diabetes mellitus type 2, uncontrolled, with complications   5. Seizure disorder    Hx of cirrhosis and hepatic encephalopathy.  Presenting here obtunded, found on floor at home.  He has had multiple admissions for AMS secondary to HE.  I suspect this today as well.    Labs are not consistent with hepatic encephalopathy.  Pt has remained somnolent.  He will open his eye and respond to commands, but has been  sleeping otherwise.  Internal medicine will admit.    Candyce Churn, MD 01/12/13 0000

## 2013-01-12 ENCOUNTER — Encounter (HOSPITAL_COMMUNITY): Payer: Self-pay | Admitting: *Deleted

## 2013-01-12 DIAGNOSIS — K7460 Unspecified cirrhosis of liver: Secondary | ICD-10-CM

## 2013-01-12 DIAGNOSIS — G40909 Epilepsy, unspecified, not intractable, without status epilepticus: Secondary | ICD-10-CM

## 2013-01-12 DIAGNOSIS — D638 Anemia in other chronic diseases classified elsewhere: Secondary | ICD-10-CM

## 2013-01-12 DIAGNOSIS — R4182 Altered mental status, unspecified: Secondary | ICD-10-CM

## 2013-01-12 LAB — GLUCOSE, CAPILLARY
Glucose-Capillary: 105 mg/dL — ABNORMAL HIGH (ref 70–99)
Glucose-Capillary: 69 mg/dL — ABNORMAL LOW (ref 70–99)
Glucose-Capillary: 92 mg/dL (ref 70–99)
Glucose-Capillary: 154 mg/dL — ABNORMAL HIGH (ref 70–99)
Glucose-Capillary: 163 mg/dL — ABNORMAL HIGH (ref 70–99)

## 2013-01-12 MED ORDER — SODIUM CHLORIDE 0.9 % IJ SOLN: 3.0000 mL | INTRAMUSCULAR | Status: DC | PRN

## 2013-01-12 MED ORDER — ENSURE COMPLETE PO LIQD
237.0000 mL | Freq: Two times a day (BID) | ORAL | Status: DC
  Administered 2013-01-12 – 2013-01-13 (×2): 237 mL via ORAL

## 2013-01-12 MED ORDER — DEXTROSE 50 % IV SOLN
50.0000 mL | Freq: Once | INTRAVENOUS | Status: AC
  Administered 2013-01-12: 05:00:00 50 mL via INTRAVENOUS
  Filled 2013-01-12: qty 50

## 2013-01-12 MED ORDER — LACTULOSE 10 GM/15ML PO SOLN
30.0000 g | Freq: Every day | ORAL | Status: DC
  Administered 2013-01-12 – 2013-01-13 (×7): 30 g via ORAL
  Filled 2013-01-12 (×11): qty 45

## 2013-01-12 MED ORDER — PAROXETINE HCL 20 MG PO TABS
20.0000 mg | ORAL_TABLET | Freq: Every day | ORAL | Status: DC
  Administered 2013-01-12 – 2013-01-13 (×2): 20 mg via ORAL
  Filled 2013-01-12 (×2): qty 1

## 2013-01-12 MED ORDER — INSULIN ASPART 100 UNIT/ML ~~LOC~~ SOLN
0.0000 [IU] | SUBCUTANEOUS | Status: DC
  Administered 2013-01-12 – 2013-01-13 (×3): 3 [IU] via SUBCUTANEOUS
  Administered 2013-01-13: 12:00:00 8 [IU] via SUBCUTANEOUS
  Administered 2013-01-13 (×2): 2 [IU] via SUBCUTANEOUS

## 2013-01-12 MED ORDER — SODIUM CHLORIDE 0.9 % IJ SOLN
3.0000 mL | Freq: Two times a day (BID) | INTRAMUSCULAR | Status: DC
  Administered 2013-01-12 – 2013-01-13 (×2): 3 mL via INTRAVENOUS

## 2013-01-12 MED ORDER — LEVOTHYROXINE SODIUM 75 MCG PO TABS
75.0000 ug | ORAL_TABLET | Freq: Every day | ORAL | Status: DC
  Administered 2013-01-12 – 2013-01-13 (×2): 75 ug via ORAL
  Filled 2013-01-12 (×3): qty 1

## 2013-01-12 MED ORDER — FOLIC ACID 1 MG PO TABS
1.0000 mg | ORAL_TABLET | Freq: Every morning | ORAL | Status: DC
  Administered 2013-01-12 – 2013-01-13 (×2): 1 mg via ORAL
  Filled 2013-01-12 (×2): qty 1

## 2013-01-12 MED ORDER — OXYCODONE HCL 5 MG PO TABS
10.0000 mg | ORAL_TABLET | Freq: Four times a day (QID) | ORAL | Status: DC | PRN
  Administered 2013-01-13 (×3): 10 mg via ORAL
  Filled 2013-01-12 (×3): qty 2

## 2013-01-12 MED ORDER — SODIUM CHLORIDE 0.9 % IV SOLN
250.0000 mL | INTRAVENOUS | Status: DC | PRN
  Administered 2013-01-12: 04:00:00 250 mL via INTRAVENOUS

## 2013-01-12 MED ORDER — ONDANSETRON HCL 4 MG PO TABS: 4.0000 mg | ORAL_TABLET | Freq: Four times a day (QID) | ORAL | Status: DC | PRN

## 2013-01-12 MED ORDER — PANTOPRAZOLE SODIUM 40 MG PO TBEC
80.0000 mg | DELAYED_RELEASE_TABLET | Freq: Every day | ORAL | Status: DC
  Administered 2013-01-12 – 2013-01-13 (×2): 80 mg via ORAL
  Filled 2013-01-12 (×2): qty 2

## 2013-01-12 MED ORDER — NADOLOL 40 MG PO TABS
40.0000 mg | ORAL_TABLET | Freq: Every morning | ORAL | Status: DC
  Administered 2013-01-12 – 2013-01-13 (×2): 40 mg via ORAL
  Filled 2013-01-12 (×2): qty 1

## 2013-01-12 MED ORDER — RIFAXIMIN 550 MG PO TABS
550.0000 mg | ORAL_TABLET | Freq: Two times a day (BID) | ORAL | Status: DC
  Administered 2013-01-12 – 2013-01-13 (×3): 550 mg via ORAL
  Filled 2013-01-12 (×4): qty 1

## 2013-01-12 MED ORDER — LEVETIRACETAM 500 MG PO TABS
500.0000 mg | ORAL_TABLET | Freq: Two times a day (BID) | ORAL | Status: DC
  Administered 2013-01-12 – 2013-01-13 (×3): 500 mg via ORAL
  Filled 2013-01-12 (×4): qty 1

## 2013-01-12 MED ORDER — ALBUTEROL SULFATE (5 MG/ML) 0.5% IN NEBU: 2.5000 mg | INHALATION_SOLUTION | RESPIRATORY_TRACT | Status: DC | PRN

## 2013-01-12 MED ORDER — ADULT MULTIVITAMIN W/MINERALS CH
1.0000 | ORAL_TABLET | Freq: Every day | ORAL | Status: DC
  Administered 2013-01-12 – 2013-01-13 (×2): 1 via ORAL
  Filled 2013-01-12 (×2): qty 1

## 2013-01-12 MED ORDER — FERROUS SULFATE 325 (65 FE) MG PO TABS
325.0000 mg | ORAL_TABLET | Freq: Every day | ORAL | Status: DC
  Administered 2013-01-12: 325 mg via ORAL
  Filled 2013-01-12 (×2): qty 1

## 2013-01-12 MED ORDER — SPIRONOLACTONE 100 MG PO TABS
100.0000 mg | ORAL_TABLET | Freq: Every morning | ORAL | Status: DC
  Administered 2013-01-12 – 2013-01-13 (×2): 100 mg via ORAL
  Filled 2013-01-12 (×2): qty 1

## 2013-01-12 MED ORDER — FUROSEMIDE 40 MG PO TABS
60.0000 mg | ORAL_TABLET | Freq: Two times a day (BID) | ORAL | Status: DC
  Administered 2013-01-12 – 2013-01-13 (×4): 60 mg via ORAL
  Filled 2013-01-12 (×5): qty 1

## 2013-01-12 MED ORDER — POTASSIUM CHLORIDE CRYS ER 20 MEQ PO TBCR
20.0000 meq | EXTENDED_RELEASE_TABLET | Freq: Every morning | ORAL | Status: DC
  Administered 2013-01-12 – 2013-01-13 (×2): 20 meq via ORAL
  Filled 2013-01-12 (×2): qty 1

## 2013-01-12 MED ORDER — ONDANSETRON HCL 4 MG/2ML IJ SOLN: 4.0000 mg | Freq: Four times a day (QID) | INTRAMUSCULAR | Status: DC | PRN

## 2013-01-12 NOTE — H&P (Signed)
[facility] History and Physical  Tony Boyd ZOX:[id number] DOB: 11/20/1955    PCP:   [name], DO   Chief Complaint: altered mental status.  HPI: Tony Boyd is an 57 y.o. male with hx of bipolar disorder, hep C, alcoholic cirrhosis, frequent admission for hepatic encephalopathy, hx of seizure, coagulopathy, who was recommended SNF, or ALF, but he was deemed competent by psychiatry, and had refused placement in the past.  He was again found unresponsive at home on the floor reportedly by family, and EMS had brought him here.  There were no reported of witnessed seizure, tongue biting, but in the ER, he was progressively become more alert, and anwered a few questions. Evalaution in the ER included a normal NH3 level, normal renal fx tests along with normal WBC, and unremarkable LFTs.  His head CT and CXR showed no acute processes.  Hospitalist was asked to admit him for altered mental status.  Rewiew of Systems:  I was not able to obtain a reliable ROS.   Past Medical History  Diagnosis Date  . Cirrhosis of liver   . Hepatitis C   . ETOH abuse   . Hypothyroid   . Psychosis   . Bipolar disorder   . Seizures   . Arthritis   . Back pain   . Coagulopathy     Hx of  . Thrombocytopenia     Hx of  . Pancytopenia     Hx of  . H/O hypokalemia   . Hyponatremia     Hx of  . Korsakoff psychosis   . H/O abdominal abscess   . H/O esophageal varices   . Metabolic encephalopathy   . Hepatic encephalopathy   . Urinary urgency   . H/O renal failure   . H/O ascites   . Altered mental status   . Anemia   . Ascites   . Shortness of breath   . Peripheral vascular disease   . GERD (gastroesophageal reflux disease)   . Thrombocytopenia, acquired 06/02/2012  . Unspecified hypothyroidism 06/02/2012  . Pain in joint, pelvic region and thigh   . Edema   . Obesity, unspecified   . Chest pain, unspecified   . Pneumonitis due to inhalation of food or vomitus   . Other convulsions    . Cellulitis and abscess of upper arm and forearm   . Chronic hepatitis C without mention of hepatic coma   . Hypopotassemia   . Anemia, unspecified   . Other and unspecified coagulation defects   . Thrombocytopenia, unspecified   . Bipolar I disorder, most recent episode (or current) unspecified   . Unspecified psychosis   . Encephalopathy   . Esophageal varices without mention of bleeding   . Esophagitis, unspecified   . Abscess of liver(572.0)   . Chronic kidney disease, unspecified   . Lumbago   . Personal history of alcoholism   . Personal history of tobacco use, presenting hazards to health   . Cirrhosis of liver without mention of alcohol   . Pneumonitis due to inhalation of food or vomitus 09/19/2010  . Chronic hepatitis C without mention of hepatic coma   . Bipolar I disorder, most recent episode (or current) unspecified   . Unspecified psychosis   . Other ascites   . Personal history of tobacco use, presenting hazards to health   . Cirrhosis of liver without mention of alcohol   . Type 2 diabetes mellitus with diabetic neuropathy     Past Surgical History  Procedure Laterality Date  . Colostomy    . Cholecystectomy    . Small intestine surgery    . Arm surgery      left arm  . US guided drain placement in ventral hernia abscess  07/21/2010  . Multiple picc line placements    . Esophagogastroduodenoscopy  06/28/2011    Procedure: ESOPHAGOGASTRODUODENOSCOPY (EGD);  Surgeon: Louis Meckel, MD;  Location: [facility];  Service: Endoscopy;  Laterality: N/A;  . Esophagogastroduodenoscopy N/A 05/06/2012    Procedure: ESOPHAGOGASTRODUODENOSCOPY (EGD);  Surgeon: Louis Meckel, MD;  Location: [facility];  Service: Endoscopy;  Laterality: N/A;  . Esophageal banding N/A 05/06/2012    Procedure: ESOPHAGEAL BANDING;  Surgeon: Louis Meckel, MD;  Location: [facility];  Service: Endoscopy;  Laterality: N/A;    Medications:  HOME MEDS: Prior to Admission medications    Medication Sig Start Date End Date Taking? Authorizing Provider  ALPRAZolam Prudy Feeler) 1 MG tablet Take 1 mg by mouth at bedtime.   Yes Historical Provider, MD  alum & mag hydroxide-simeth (MAALOX/MYLANTA) 200-200-20 MG/5ML suspension Take 30 mLs by mouth every 6 (six) hours as needed. 12/11/12  Yes Drema Dallas, MD  ferrous sulfate 325 (65 FE) MG tablet Take 325 mg by mouth at bedtime.   Yes Historical Provider, MD  folic acid (FOLVITE) 1 MG tablet Take 1 mg by mouth every morning.    Yes Historical Provider, MD  furosemide (LASIX) 20 MG tablet Take 3 tablets (60 mg total) by mouth 2 (two) times daily. 12/11/12  Yes Drema Dallas, MD  insulin aspart (NOVOLOG) 100 UNIT/ML injection Inject 5 Units into the skin 3 (three) times daily with meals. Pt to take 5 units of novolog if blood sugar is 150 or above with meals; No novolog if blood sugar under 150 01/08/13  Yes Claudie Revering, NP  insulin glargine (LANTUS) 100 UNIT/ML injection Inject 12-28 Units into the skin at bedtime.   Yes Historical Provider, MD  lactulose (CHRONULAC) 10 GM/15ML solution Take 45 mLs (30 g total) by mouth 5 (five) times daily. 01/08/13  Yes Claudie Revering, NP  levETIRAcetam (KEPPRA) 500 MG tablet Take 500 mg by mouth 2 (two) times daily.   Yes Historical Provider, MD  levothyroxine (SYNTHROID, LEVOTHROID) 75 MCG tablet Take 75 mcg by mouth every morning.    Yes Historical Provider, MD  nadolol (CORGARD) 40 MG tablet Take 40 mg by mouth every morning.   Yes Historical Provider, MD  omeprazole (PRILOSEC) 20 MG capsule Take 20 mg by mouth every morning.    Yes Historical Provider, MD  Oxycodone HCl 10 MG TABS Take 1 tablet (10 mg total) by mouth every 4 (four) hours. 12/26/12  Yes Tiffany L Reed, DO  potassium chloride SA (K-DUR,KLOR-CON) 20 MEQ tablet Take 20 mEq by mouth every morning.   Yes Historical Provider, MD  rifaximin (XIFAXAN) 550 MG TABS Take 550 mg by mouth 2 (two) times daily. 01/26/12  Yes Alison Murray, MD   risperiDONE (RISPERDAL) 0.25 MG tablet Take 0.25 mg by mouth 2 (two) times daily.   Yes Historical Provider, MD  spironolactone (ALDACTONE) 100 MG tablet Take 100 mg by mouth every morning.    Yes Historical Provider, MD  albuterol (PROVENTIL) (5 MG/ML) 0.5% nebulizer solution Take 0.5 mLs (2.5 mg total) by nebulization every 4 (four) hours as needed for wheezing or shortness of breath. 12/11/12   Drema Dallas, MD  ondansetron (ZOFRAN) 4 MG tablet Take 1 tablet (4  mg total) by mouth every 6 (six) hours as needed for nausea. 01/08/13   Claudie Revering, NP  PARoxetine (PAXIL) 20 MG tablet Take 1 tablet (20 mg total) by mouth daily. 12/11/12   Drema Dallas, MD     Allergies:  Allergies  Allergen Reactions  . Ativan [Lorazepam] Other (See Comments)    "hallucinations"  . Droperidol Other (See Comments)    unknown  . Penicillins Swelling  . Toradol [Ketorolac Tromethamine] Hives    Social History:   reports that he quit smoking about 4 years ago. His smoking use included Cigarettes. He smoked 0.00 packs per day. He has never used smokeless tobacco. He reports that he does not drink alcohol or use illicit drugs.  Family History: Family History  Problem Relation Age of Onset  . Heart disease Mother     MI  . Lung cancer Brother      Physical Exam: Filed Vitals:   01/11/13 2330 01/12/13 0000 01/12/13 0030 01/12/13 0158  BP: 90/49 97/51 92/52  115/58  Pulse: 70 68 65 78  Temp:    97.7 F (36.5 C)  TempSrc:    Oral  Resp:    20  Height:    5\' 8"  (1.727 m)  Weight:    109.4 kg (241 lb 2.9 oz)  SpO2: 94% 93% 96% 93%   Blood pressure 115/58, pulse 78, temperature 97.7 F (36.5 C), temperature source Oral, resp. rate 20, height 5\' 8"  (1.727 m), weight 109.4 kg (241 lb 2.9 oz), SpO2 93.00%.  GEN:  Pleasant patient lying in the stretcher in no acute distress; lethargic, but follow simple verbal commands. PSYCH:  Confused.  does not appear anxious  HEENT: Mucous membranes pink  and anicteric; PERRLA; EOM intact; no cervical lymphadenopathy nor thyromegaly or carotid bruit; no JVD; There were no stridor. Neck is very supple. Breasts:: Not examined CHEST WALL: No tenderness CHEST: Normal respiration, clear to auscultation bilaterally.  HEART: Regular rate and rhythm.  There are no murmur, rub, or gallops.   BACK: No kyphosis or scoliosis; no CVA tenderness ABDOMEN: soft and non-tender; very obese, normal abdominal bowel sounds; no pannus; no intertriginous candida. There is no rebound and no distention. Rectal Exam: Not done EXTREMITIES: No bone or joint deformity; age-appropriate arthropathy of the hands and knees; some edema, no asterxis.  no ulcerations.  There is no calf tenderness. Genitalia: not examined PULSES: 2+ and symmetric SKIN: Normal hydration no rash or ulceration CNS: Cranial nerves 2-12 grossly intact no focal lateralizing neurologic deficit.  uvula elevated with phonation, facial symmetry and tongue midline. DTR are normal bilaterally  Labs on Admission:  Basic Metabolic Panel:  Recent Labs Lab 01/08/13 1150 01/11/13 2054  NA 133* 137  K 3.3* 4.0  CL 95* 101  CO2 28 30  GLUCOSE 131* 119*  BUN 18 12  CREATININE 0.85 0.72  CALCIUM 8.0* 8.3*   Liver Function Tests:  Recent Labs Lab 01/08/13 1150 01/11/13 2054  AST 50* 66*  ALT 24 28  ALKPHOS 162* 144*  BILITOT 2.6* 3.2*  PROT 6.5 6.6  ALBUMIN  --  2.3*   No results found for this basename: LIPASE, AMYLASE,  in the last 168 hours  Recent Labs Lab 01/08/13 1150 01/11/13 2054  AMMONIA 286* 38   CBC:  Recent Labs Lab 01/08/13 1150 01/11/13 1920  WBC 3.6 4.7  NEUTROABS 2.6 3.5  HGB 10.6* 10.9*  HCT 31.0* 32.0*  MCV 102* 100.9*  PLT 46* 75*  Cardiac Enzymes:  Recent Labs Lab 01/11/13 2054  CKTOTAL 503*  TROPONINI <0.30    CBG: No results found for this basename: GLUCAP,  in the last 168 hours   Radiological Exams on Admission: Ct Head Wo  Contrast  01/11/2013   CLINICAL DATA:  Fall.  Altered mental status.  Unresponsive patient.  EXAM: CT HEAD WITHOUT CONTRAST  TECHNIQUE: Contiguous axial images were obtained from the base of the skull through the vertex without intravenous contrast.  COMPARISON:  11/12/2012  FINDINGS: The brainstem, cerebellum, cerebral peduncles, thalamus, basal ganglia, basilar cisterns, and ventricular system appear within normal limits. No intracranial hemorrhage, mass lesion, or acute CVA.  Mucus retention cyst in the left maxillary sinus, with bilateral mucoperiosteal thickening in both maxillary sinuses and in several ethmoid air cells. Left mastoid effusion noted. No middle ear fluid observed.  IMPRESSION: 1. Chronic paranasal sinusitis.  No acute intracranial findings. 2. Left mastoid effusion.   Electronically Signed   By: Herbie Baltimore M.D.   On: 01/11/2013 21:28   Dg Chest Port 1 View  01/11/2013   CLINICAL DATA:  Altered mental status  EXAM: PORTABLE CHEST - 1 VIEW  COMPARISON:  12/27/2012; 12/03/2012  FINDINGS: Grossly unchanged enlarged cardiac silhouette and mediastinal contours given persistently reduced lung volumes. Overall improved aeration of the lungs with persistent perihilar and left basilar opacities. No new focal airspace opacities. No definite pleural effusion or pneumothorax. No evidence of edema. Unchanged bones. Interval removal of right upper extremity approach PICC line.  IMPRESSION: Grossly unchanged findings of cardiomegaly and perihilar and left basilar opacities, atelectasis versus infiltrate. Further evaluation with a PA and lateral chest radiograph may be obtained as clinically indicated.   Electronically Signed   By: Simonne Come M.D.   On: 01/11/2013 20:04    Assessment/Plan Present on Admission:  . Altered mental status . Diabetes mellitus type 2, uncontrolled, with complications . Hepatitis C . Hypothyroidism . Thrombocytopenia, acquired . Weakness generalized . ETOH  abuse  PLAN:  I suspect he may have had another seizure at home, presented post ictal, and is getting more alert.  He also has been on sedative meds which may make him lethargic.  Will admit him to OBS, continue Keppra, and d/c respiradol, muscle relaxants and narcotics.  Will continue his lactulose.  He is doing well without elevated NH3 today.  He is otherwise stable, full code, and will be admitted to [facility] service.  Thank you for allowing me to participate in his care.  Perhaps he will be agreeable to be in a SNF with better monitoring than at home.   Other plans as per orders.  Code Status: FULL Unk Lightning, MD. [facility] Pager [phone] 7pm to 7am.  01/12/2013, 2:01 AM

## 2013-01-12 NOTE — Progress Notes (Signed)
INITIAL NUTRITION ASSESSMENT  DOCUMENTATION CODES Per approved criteria  -Obesity Unspecified   INTERVENTION: Provide Ensure Complete BID Provide Snacks BID Provide Multivitamin with minerals daily  NUTRITION DIAGNOSIS: Increased nutrient needs related to cirrhosis and malnutrition as evidenced by estimated needs.   Goal: Pt to meet >/= 90% of their estimated nutrition needs   Monitor:  PO intake Weight trends Labs  Reason for Assessment: Consult (Diet Education), Malnutrition screening tool, score of 2  57 y.o. male  Admitting Dx: Altered mental status  ASSESSMENT: 57 y.o. male with past medical history of hepatitis C, alcohol liver cirrhosis, frequent admissions for hepatic encephalopathy, seizure disorder , coagulopathy who presented to [facility] ED 01/11/2013 with lethargy, altered mental status and being poorly responsive when found at home by his family.   Pt reports that his baseline weight is 240 lbs but at times his weight increases to over 300 lbs due to fluid accumulation. Pt about to eat lunch at time of visit; pt reports this is his first meal in the past 2 days. Pt states he usually eats 2 meals daily and starts everyday by drinking an Ensure supplement. Per MD note, pt with moderate protein-calorie malnutrition and anemia of chronic disease.   RD consulted for nutrition education. RD provided "Cirrhosis Nutrition Therapy" handout from the Academy of [employer]. Reviewed patient's dietary recall. Provided examples on ways to decrease sodium and fat intake in diet. Discouraged intake of processed foods and use of salt shaker. Encouraged fresh fruits and vegetables as well as whole grain sources of carbohydrates to maximize fiber intake. Encouraged pt to eat small frequent amounts 5-6 times daily. Encouraged pt to drink an Ensure supplement if he skips a meal. Teach back method used.  Expect fair compliance. RD name and contact information provided; encouraged to  call with nutrition-related questions or concerns.    Height: Ht Readings from Last 1 Encounters:  01/12/13 5\' 8"  (1.727 m)    Weight: Wt Readings from Last 1 Encounters:  01/12/13 241 lb 2.9 oz (109.4 kg)    Ideal Body Weight: 154 lbs  % Ideal Body Weight: 156%  Wt Readings from Last 10 Encounters:  01/12/13 241 lb 2.9 oz (109.4 kg)  01/08/13 245 lb (111.131 kg)  12/28/12 228 lb 13.4 oz (103.8 kg)  12/11/12 258 lb 11.2 oz (117.346 kg)  11/12/12 281 lb 12 oz (127.8 kg)  10/14/12 310 lb 3 oz (140.7 kg)  10/08/12 325 lb 2.9 oz (147.5 kg)  10/02/12 302 lb (136.986 kg)  09/25/12 273 lb (123.832 kg)  09/21/12 252 lb 6.8 oz (114.5 kg)    Usual Body Weight: 240 lbs  % Usual Body Weight: 100%  BMI:  Body mass index is 36.68 kg/(m^2).  Estimated Nutritional Needs: Kcal: 2200-2450 Protein: 100-110 grams Fluid: >/= 2.4 L/day  Skin: +1 LUE and generalized edema, +2 RLE and LLE edema  Diet Order: Sodium Restricted  EDUCATION NEEDS: -Education needs addressed   Intake/Output Summary (Last 24 hours) at 01/12/13 1514 Last data filed at 01/12/13 1400  Gross per 24 hour  Intake      0 ml  Output    325 ml  Net   -325 ml    Last BM: 11/16  Labs:   Recent Labs Lab 01/08/13 1150 01/11/13 2054  NA 133* 137  K 3.3* 4.0  CL 95* 101  CO2 28 30  BUN 18 12  CREATININE 0.85 0.72  CALCIUM 8.0* 8.3*  GLUCOSE 131* 119*  CBG (last 3)   Recent Labs  01/12/13 0527 01/12/13 0804 01/12/13 1216  GLUCAP 105* 113* 92    Scheduled Meds: . ferrous sulfate  325 mg Oral QHS  . folic acid  1 mg Oral q morning - 10a  . furosemide  60 mg Oral BID  . insulin aspart  0-15 Units Subcutaneous Q4H  . lactulose  30 g Oral 5 X Daily  . levETIRAcetam  500 mg Oral BID  . levothyroxine  75 mcg Oral QAC breakfast  . nadolol  40 mg Oral q morning - 10a  . pantoprazole  80 mg Oral Daily  . PARoxetine  20 mg Oral Daily  . potassium chloride SA  20 mEq Oral q morning - 10a  .  rifaximin  550 mg Oral BID  . sodium chloride  3 mL Intravenous Q12H  . spironolactone  100 mg Oral q morning - 10a    Continuous Infusions:   Past Medical History  Diagnosis Date  . Cirrhosis of liver   . Hepatitis C   . ETOH abuse   . Hypothyroid   . Psychosis   . Bipolar disorder   . Seizures   . Arthritis   . Back pain   . Coagulopathy     Hx of  . Thrombocytopenia     Hx of  . Pancytopenia     Hx of  . H/O hypokalemia   . Hyponatremia     Hx of  . Korsakoff psychosis   . H/O abdominal abscess   . H/O esophageal varices   . Metabolic encephalopathy   . Hepatic encephalopathy   . Urinary urgency   . H/O renal failure   . H/O ascites   . Altered mental status   . Anemia   . Ascites   . Shortness of breath   . Peripheral vascular disease   . GERD (gastroesophageal reflux disease)   . Thrombocytopenia, acquired 06/02/2012  . Unspecified hypothyroidism 06/02/2012  . Pain in joint, pelvic region and thigh   . Edema   . Obesity, unspecified   . Chest pain, unspecified   . Pneumonitis due to inhalation of food or vomitus   . Other convulsions   . Cellulitis and abscess of upper arm and forearm   . Chronic hepatitis C without mention of hepatic coma   . Hypopotassemia   . Anemia, unspecified   . Other and unspecified coagulation defects   . Thrombocytopenia, unspecified   . Bipolar I disorder, most recent episode (or current) unspecified   . Unspecified psychosis   . Encephalopathy   . Esophageal varices without mention of bleeding   . Esophagitis, unspecified   . Abscess of liver(572.0)   . Chronic kidney disease, unspecified   . Lumbago   . Personal history of alcoholism   . Personal history of tobacco use, presenting hazards to health   . Cirrhosis of liver without mention of alcohol   . Pneumonitis due to inhalation of food or vomitus 09/19/2010  . Chronic hepatitis C without mention of hepatic coma   . Bipolar I disorder, most recent episode (or  current) unspecified   . Unspecified psychosis   . Other ascites   . Personal history of tobacco use, presenting hazards to health   . Cirrhosis of liver without mention of alcohol   . Type 2 diabetes mellitus with diabetic neuropathy     Past Surgical History  Procedure Laterality Date  . Colostomy    . Cholecystectomy    .  Small intestine surgery    . Arm surgery      left arm  . US guided drain placement in ventral hernia abscess  07/21/2010  . Multiple picc line placements    . Esophagogastroduodenoscopy  06/28/2011    Procedure: ESOPHAGOGASTRODUODENOSCOPY (EGD);  Surgeon: Louis Meckel, MD;  Location: [facility];  Service: Endoscopy;  Laterality: N/A;  . Esophagogastroduodenoscopy N/A 05/06/2012    Procedure: ESOPHAGOGASTRODUODENOSCOPY (EGD);  Surgeon: Louis Meckel, MD;  Location: [facility];  Service: Endoscopy;  Laterality: N/A;  . Esophageal banding N/A 05/06/2012    Procedure: ESOPHAGEAL BANDING;  Surgeon: Louis Meckel, MD;  Location: [facility];  Service: Endoscopy;  Laterality: N/A;    Ian Malkin RD, LDN Inpatient Clinical Dietitian Pager: [phone] After Hours Pager: [phone]

## 2013-01-12 NOTE — Progress Notes (Signed)
[facility] PROGRESS NOTE  Tony Boyd UJW:[id number] DOB: 05-28-1955 DOA: 01/11/2013 PCP: [name], DO  Brief narrative: 57 y.o. male with past medical history of hepatitis C, alcohol liver cirrhosis, frequent admissions for hepatic encephalopathy, seizure disorder , coagulopathy who presented to [facility] ED 01/11/2013 with lethargy, altered mental status and being poorly responsive when found at home by his family. No reports of seizures.   Evalaution in the ER included a normal ammonia level. Hemoglobin was 10.9 and essentially unremarkable BMP. CT head and CRX did not reveal acute abnormalities.  Assessment/Plan:  Principal Problem:  Acute hepatic encephalopathy  - Continue lactulose 30 g PO x 5 daily, titrate to 3 BM a day.  - Continue rifaximin 500 mg PO BID.  - PT evaluation pending - psych for capacity Active Problems:  Anasarca, Scrotal swelling  - Secondary to history of liver cirrhosis.  - Continue lasix 60 mg twice a day and spironolactone 100 mg daily.  Liver Cirrhosis  - Continue lasix, spironolactone, rifaximin and lactulose.  - also on nadolol 40 mg po daily Seizure disorder  - Continue Keppra 500 mg PO BID.  Thrombocytopenia, acquired  - Secondary to liver disease.  Anemia of chronic disease - secondary to liver cirrhosis - hemoglobin 10.9 on this admission - no indications for transfusion - continue folic acid and ferrous sulfate supplements Hypothyroidism  - Continue synthroid.  Diabetes mellitus  - Continue sliding scale insulin for now Moderate protein-calorie malnutrition  - Nutrition consulted.    Code Status: full code Family Communication: no family at the bedside Disposition Plan: needs PT eval  [name], MD  [facility] Pager [phone]  If 7PM-7AM, please contact night-coverage [email or web address] Password [email or web address] 01/12/2013, 1:10 PM   LOS: 1 day   Consultants:  None   Procedures:  None   Antibiotics:  None    HPI/Subjective: No acute overnight events.  Objective: Filed Vitals:   01/12/13 0000 01/12/13 0030 01/12/13 0158 01/12/13 0500  BP: 97/51 92/52 115/58 113/52  Pulse: 68 65 78 71  Temp:   97.7 F (36.5 C) 97.6 F (36.4 C)  TempSrc:   Oral Oral  Resp:   20 16  Height:   5\' 8"  (1.727 m)   Weight:   109.4 kg (241 lb 2.9 oz)   SpO2: 93% 96% 93% 93%   No intake or output data in the 24 hours ending 01/12/13 1310  Exam:   General:  Pt is alert, somewhat confused, not in acute distress  Cardiovascular: Regular rate and rhythm, S1/S2 appreciated  Respiratory: Clear to auscultation bilaterally, no wheezing, no crackles, no rhonchi  Abdomen: Soft, non tender, non distended, bowel sounds present, no guarding  Extremities: LE edema, pulses DP and PT palpable bilaterally  Neuro: Grossly nonfocal  Data Reviewed: Basic Metabolic Panel:  Recent Labs Lab 01/08/13 1150 01/11/13 2054  NA 133* 137  K 3.3* 4.0  CL 95* 101  CO2 28 30  GLUCOSE 131* 119*  BUN 18 12  CREATININE 0.85 0.72  CALCIUM 8.0* 8.3*   Liver Function Tests:  Recent Labs Lab 01/08/13 1150 01/11/13 2054  AST 50* 66*  ALT 24 28  ALKPHOS 162* 144*  BILITOT 2.6* 3.2*  PROT 6.5 6.6  ALBUMIN  --  2.3*   No results found for this basename: LIPASE, AMYLASE,  in the last 168 hours  Recent Labs Lab 01/08/13 1150 01/11/13 2054  AMMONIA 286* 38   CBC:  Recent Labs Lab 01/08/13 1150 01/11/13 1920  WBC  3.6 4.7  NEUTROABS 2.6 3.5  HGB 10.6* 10.9*  HCT 31.0* 32.0*  MCV 102* 100.9*  PLT 46* 75*   Cardiac Enzymes:  Recent Labs Lab 01/11/13 2054  CKTOTAL 503*  TROPONINI <0.30   BNP: No components found with this basename: POCBNP,  CBG:  Recent Labs Lab 01/12/13 0435 01/12/13 0527 01/12/13 0804 01/12/13 1216  GLUCAP 69* 105* 113* 92    No results found for this or any previous visit (from the past 240 hour(s)).   Studies: Ct Head Wo Contrast 01/11/2013   IMPRESSION: 1. Chronic  paranasal sinusitis.  No acute intracranial findings. 2. Left mastoid effusion.   Electronically Signed   By: Herbie Baltimore M.D.   On: 01/11/2013 21:28   Dg Chest Port 1 View 01/11/2013    IMPRESSION: Grossly unchanged findings of cardiomegaly and perihilar and left basilar opacities, atelectasis versus infiltrate. Further evaluation with a PA and lateral chest radiograph may be obtained as clinically indicated.   Electronically Signed   By: Simonne Come M.D.   On: 01/11/2013 20:04    Scheduled Meds: . ferrous sulfate  325 mg Oral QHS  . folic acid  1 mg Oral q morning - 10a  . furosemide  60 mg Oral BID  . insulin aspart  0-15 Units Subcutaneous Q4H  . lactulose  30 g Oral 5 X Daily  . levETIRAcetam  500 mg Oral BID  . levothyroxine  75 mcg Oral QAC breakfast  . nadolol  40 mg Oral q morning - 10a  . pantoprazole  80 mg Oral Daily  . PARoxetine  20 mg Oral Daily  . potassium chloride SA  20 mEq Oral q morning - 10a  . rifaximin  550 mg Oral BID  . spironolactone  100 mg Oral q morning - 10a   Continuous Infusions:

## 2013-01-13 DIAGNOSIS — R609 Edema, unspecified: Secondary | ICD-10-CM

## 2013-01-13 LAB — GLUCOSE, CAPILLARY: Glucose-Capillary: 169 mg/dL — ABNORMAL HIGH (ref 70–99)

## 2013-01-13 MED ORDER — OXYCODONE HCL 10 MG PO TABS: 10.0000 mg | ORAL_TABLET | ORAL | Status: DC

## 2013-01-13 NOTE — Evaluation (Signed)
I have reviewed this note and agree with all findings. Kati Yanci Bachtell, PT, DPT Pager: [phone]   

## 2013-01-13 NOTE — Evaluation (Signed)
Physical Therapy Evaluation Patient Details Name: Tony Boyd MRN: [id number] DOB: Aug 08, 1955 Today's Date: 01/13/2013 Time: 1012-1030 PT Time Calculation (min): 18 min  PT Assessment / Plan / Recommendation History of Present Illness  Pt is an 57 y.o. male with hx of bipolar disorder, hep C, alcoholic cirrhosis, frequent admission for hepatic encephalopathy, hx of seizure, coagulopathy, who was recommended SNF, or ALF, but he was deemed competent by psychiatry, and had refused placement in the past.  He was again found unresponsive at home on the floor reportedly by family, and EMS had brought him here.  There were no reported of witnessed seizure, tongue biting, but in the ER, he was progressively become more alert, and anwered a few questions. Evalaution in the ER included a normal NH3 level, normal renal fx tests along with normal WBC, and unremarkable LFTs.  His head CT and CXR showed no acute processes.  Hospitalist was asked to admit him for altered mental status  Clinical Impression  Pt admitted for the above. Pt currently presenting with functional limitations due to deficits listed below (see PT Problem List). Pt would benefit from skilled PT to increase independence and safety and to allow d/c to venue listed below. Pt able to ambulate 200' with RW and min guard, pt limited by fatigue as he required frequent standing rest breaks. Pt reports he receives HHPT 3x/week and PT recommends he continues HHPT to improve LE strength, endurance, balance, and safety.    PT Assessment  Patient needs continued PT services    Follow Up Recommendations  Home health PT (pt reports he is already receiving HHPT, PT recommends continuing HHPT)    Does the patient have the potential to tolerate intense rehabilitation      Barriers to Discharge Decreased caregiver support      Equipment Recommendations  None recommended by PT    Recommendations for Other Services     Frequency Min 3X/week     Precautions / Restrictions Precautions Precautions: Fall Restrictions Weight Bearing Restrictions: No   Pertinent Vitals/Pain Pt reports chronic 5/10 low back pain but is willing to participate in PT as he reports back pain is chronic. Pt positioned to comfort at end of session.       Mobility  Bed Mobility Bed Mobility: Supine to Sit;Sitting - Scoot to Edge of Bed;Sit to Supine Supine to Sit: 5: Supervision Sitting - Scoot to Edge of Bed: 5: Supervision Sit to Supine: 5: Supervision Details for Bed Mobility Assistance: supervision to ensure safety. Transfers Transfers: Sit to Stand;Stand to Sit Sit to Stand: 4: Min guard;With upper extremity assist;From bed Stand to Sit: 4: Min guard;To bed;With upper extremity assist Details for Transfer Assistance: min guard to ensure safety. VC's for hand placement. Ambulation/Gait Ambulation/Gait Assistance: 4: Min guard Ambulation Distance (Feet): 200 Feet Assistive device: Rolling walker Ambulation/Gait Assistance Details: min guard to ensure safety, VC's to avoid obstacles and to stay within RW. Pt required frequent standing rest breaks due to fatigue. Gait Pattern: Step-through pattern;Decreased stride length;Decreased dorsiflexion - left;Decreased dorsiflexion - right;Wide base of support Gait velocity: decreased    Exercises     PT Diagnosis: Difficulty walking;Generalized weakness  PT Problem List: Decreased strength;Decreased activity tolerance;Decreased balance;Decreased mobility;Decreased knowledge of use of DME PT Treatment Interventions: DME instruction;Gait training;Functional mobility training;Therapeutic activities;Therapeutic exercise;Balance training;Neuromuscular re-education;Patient/family education     PT Goals(Current goals can be found in the care plan section) Acute Rehab PT Goals Patient Stated Goal: to go home PT Goal Formulation: With patient  Time For Goal Achievement: 01/27/13 Potential to Achieve Goals:  Good  Visit Information  Last PT Received On: 01/13/13 Assistance Needed: +1 History of Present Illness: Pt is an 57 y.o. male with hx of bipolar disorder, hep C, alcoholic cirrhosis, frequent admission for hepatic encephalopathy, hx of seizure, coagulopathy, who was recommended SNF, or ALF, but he was deemed competent by psychiatry, and had refused placement in the past.  He was again found unresponsive at home on the floor reportedly by family, and EMS had brought him here.  There were no reported of witnessed seizure, tongue biting, but in the ER, he was progressively become more alert, and anwered a few questions. Evalaution in the ER included a normal NH3 level, normal renal fx tests along with normal WBC, and unremarkable LFTs.  His head CT and CXR showed no acute processes.  Hospitalist was asked to admit him for altered mental status       Prior Functioning  Home Living Family/patient expects to be discharged to:: Other (Comment) (independent living) Living Arrangements: Alone Type of Home: Independent living facility Home Equipment: Dan Humphreys - 4 wheels;Tub bench;Shower seat;Grab bars - tub/shower;Bedside commode;Cane - single point;Toilet riser Prior Function Level of Independence: Independent with assistive device(s) Comments: pt reports he is able to perform all ADLs independently and uses SPC in the home and rollator in the community. Pt reports he received HHPT 3x/week and a RN assists him with medications 3x/week. Communication Communication: No difficulties Dominant Hand: Right    Cognition  Cognition Arousal/Alertness: Awake/alert Behavior During Therapy: WFL for tasks assessed/performed Overall Cognitive Status: Within Functional Limits for tasks assessed    Extremity/Trunk Assessment Lower Extremity Assessment Lower Extremity Assessment: Generalized weakness   Balance Balance Balance Assessed: Yes Static Standing Balance Static Standing - Balance Support: No upper  extremity supported Static Standing - Level of Assistance: 5: Stand by assistance Static Standing - Comment/# of Minutes: pt attempted to perform SLS but unable to raise either LE and required min guard to ensure safety.  End of Session PT - End of Session Activity Tolerance: Patient limited by fatigue Patient left: with call bell/phone within reach;in bed  GP     Sol Blazing 01/13/2013, 11:13 AM

## 2013-01-13 NOTE — Discharge Summary (Signed)
Physician Discharge Summary  Safir Michalec ZOX:[id number] DOB: Jun 11, 1955 DOA: 01/11/2013  PCP: [name], DO  Admit date: 01/11/2013 Discharge date: 01/13/2013  Recommendations for Outpatient Follow-up:  1. Please continue same medications as prior to this admission. Follow up with PCP within 1-2 weeks on discharge to make sure symptoms are controlled. 2. Patient has capacity to make decisions about his treatment and living arrangements 3. HH orders in place, PT, OT, RN and aide  Discharge Diagnoses:  Principal Problem:   Altered mental status Active Problems:   Hepatic encephalopathy   ETOH abuse   Hepatitis C   Cirrhosis   Seizure disorder   Thrombocytopenia, acquired   Hypothyroidism   Diabetes mellitus type 2, uncontrolled, with complications   Anemia of chronic disease   Moderate protein-calorie malnutrition   Weakness generalized   Fall    Discharge Condition: medically stable for discharge home today  Diet recommendation: as tolerated, low sodium   History of present illness:  57 y.o. male with past medical history of hepatitis C, alcohol liver cirrhosis, frequent admissions for hepatic encephalopathy, seizure disorder , coagulopathy who presented to [facility] ED 01/11/2013 with lethargy, altered mental status and being poorly responsive when found at home by his family. No reports of seizures.  Evalaution in the ER included a normal ammonia level. Hemoglobin was 10.9 and essentially unremarkable BMP. CT head and CRX did not reveal acute abnormalities.   Assessment/Plan:   Principal Problem:  Acute hepatic encephalopathy  - Continue lactulose 30 g PO x 5 daily, titrate to 3 BM a day.  - Continue rifaximin 500 mg PO BID.  - alert and oriented to time, place and person Active Problems:  Anasarca, Scrotal swelling  - Secondary to history of liver cirrhosis.  - Continue lasix 60 mg twice a day and spironolactone 100 mg daily.  Liver Cirrhosis  - Continue lasix,  spironolactone, rifaximin and lactulose.  - also on nadolol 40 mg po daily  Seizure disorder  - Continue Keppra 500 mg PO BID.  Thrombocytopenia, acquired  - Secondary to liver disease.  Anemia of chronic disease  - secondary to liver cirrhosis  - hemoglobin 10.9 on this admission  - no indications for transfusion  - continue folic acid and ferrous sulfate supplements  Hypothyroidism  - Continue synthroid.  Diabetes mellitus  - Continue sliding scale insulin for now  Moderate protein-calorie malnutrition  - Nutrition consulted.  - has good PO intake   Code Status: full code  Family Communication: no family at the bedside    Consultants:  None  Procedures:  None  Antibiotics:  None    Signed:  [name], MD  [facility] 01/13/2013, 12:08 PM  Pager #: [phone]   Discharge Exam: Filed Vitals:   01/13/13 0429  BP: 98/67  Pulse: 87  Temp: 98.4 F (36.9 C)  Resp: 18   Filed Vitals:   01/12/13 0500 01/12/13 1534 01/12/13 2026 01/13/13 0429  BP: 113/52 128/62 102/51 98/67  Pulse: 71 79 79 87  Temp: 97.6 F (36.4 C) 98.5 F (36.9 C) 98.7 F (37.1 C) 98.4 F (36.9 C)  TempSrc: Oral Oral Oral Oral  Resp: 16 18 18 18   Height:      Weight:      SpO2: 93% 94% 100% 100%    General: Pt is alert, follows commands appropriately, not in acute distress Cardiovascular: Regular rate and rhythm, S1/S2 appreciated Respiratory: Clear to auscultation bilaterally, no wheezing, no crackles, no rhonchi Abdominal: Soft, non  tender, non distended, bowel sounds +, no guarding Extremities: chronic LE pitting edema, no cyanosis, pulses palpable bilaterally DP and PT Neuro: Grossly nonfocal  Discharge Instructions  Discharge Orders   Future Appointments Provider Department Dept Phone   01/21/2013 8:45 AM Louis Meckel, MD [facility] [phone]   02/11/2013 8:45 AM Claudie Revering, NP [facility] [phone]   Future  Orders Complete By Expires   Call MD for:  difficulty breathing, headache or visual disturbances  As directed    Call MD for:  persistant dizziness or light-headedness  As directed    Call MD for:  persistant nausea and vomiting  As directed    Call MD for:  severe uncontrolled pain  As directed    Diet - low sodium heart healthy  As directed    Discharge instructions  As directed    Comments:     1. Please continue same medications as prior to this admission. Follow up with PCP within 1-2 weeks on discharge to make sure symptoms are controlled.   Increase activity slowly  As directed        Medication List         albuterol (5 MG/ML) 0.5% nebulizer solution  Commonly known as:  PROVENTIL  Take 0.5 mLs (2.5 mg total) by nebulization every 4 (four) hours as needed for wheezing or shortness of breath.     ALPRAZolam 1 MG tablet  Commonly known as:  XANAX  Take 1 mg by mouth at bedtime.     alum & mag hydroxide-simeth 200-200-20 MG/5ML suspension  Commonly known as:  MAALOX/MYLANTA  Take 30 mLs by mouth every 6 (six) hours as needed.     ferrous sulfate 325 (65 FE) MG tablet  Take 325 mg by mouth at bedtime.     folic acid 1 MG tablet  Commonly known as:  FOLVITE  Take 1 mg by mouth every morning.     furosemide 20 MG tablet  Commonly known as:  LASIX  Take 3 tablets (60 mg total) by mouth 2 (two) times daily.     insulin aspart 100 UNIT/ML injection  Commonly known as:  novoLOG  Inject 5 Units into the skin 3 (three) times daily with meals. Pt to take 5 units of novolog if blood sugar is 150 or above with meals; No novolog if blood sugar under 150     insulin glargine 100 UNIT/ML injection  Commonly known as:  LANTUS  Inject 12-28 Units into the skin at bedtime.     lactulose 10 GM/15ML solution  Commonly known as:  CHRONULAC  Take 45 mLs (30 g total) by mouth 5 (five) times daily.     levETIRAcetam 500 MG tablet  Commonly known as:  KEPPRA  Take 500 mg by mouth 2  (two) times daily.     levothyroxine 75 MCG tablet  Commonly known as:  SYNTHROID, LEVOTHROID  Take 75 mcg by mouth every morning.     nadolol 40 MG tablet  Commonly known as:  CORGARD  Take 40 mg by mouth every morning.     omeprazole 20 MG capsule  Commonly known as:  PRILOSEC  Take 20 mg by mouth every morning.     ondansetron 4 MG tablet  Commonly known as:  ZOFRAN  Take 1 tablet (4 mg total) by mouth every 6 (six) hours as needed for nausea.     Oxycodone HCl 10 MG Tabs  Take 1 tablet (10 mg total)  by mouth every 4 (four) hours.     PARoxetine 20 MG tablet  Commonly known as:  PAXIL  Take 1 tablet (20 mg total) by mouth daily.     potassium chloride SA 20 MEQ tablet  Commonly known as:  K-DUR,KLOR-CON  Take 20 mEq by mouth every morning.     rifaximin 550 MG Tabs tablet  Commonly known as:  XIFAXAN  Take 550 mg by mouth 2 (two) times daily.     risperiDONE 0.25 MG tablet  Commonly known as:  RISPERDAL  Take 0.25 mg by mouth 2 (two) times daily.     spironolactone 100 MG tablet  Commonly known as:  ALDACTONE  Take 100 mg by mouth every morning.           Follow-up Information   Follow up with REED, TIFFANY, DO. Schedule an appointment as soon as possible for a visit in 1 week.   Specialty:  Geriatric Medicine   Contact information:   [address] [place] [address] [phone]        The results of significant diagnostics from this hospitalization (including imaging, microbiology, ancillary and laboratory) are listed below for reference.    Significant Diagnostic Studies: Dg Chest 2 View  12/27/2012   CLINICAL DATA:  Flank pain former smoker, altered mental status, history cirrhosis, hepatitis-C, seizure disorder, diabetes  EXAM: CHEST  2 VIEW  COMPARISON:  12/03/2012  FINDINGS: Enlargement of cardiac silhouette with left ventricular prominence.  Mediastinal contours and pulmonary vascularity normal.  Right arm PICC line tip projects over SVC above  cavoatrial junction.  Question new anterior lung base opacity/ infiltrate on lateral view.  Upper lungs clear.  No pleural effusion or pneumothorax.  Bones unremarkable.  IMPRESSION: Enlargement of cardiac silhouette.  Question new anterior basilar lung infiltrate on lateral view, potentially lingula.   Electronically Signed   By: Ulyses Southward M.D.   On: 12/27/2012 17:26   Ct Head Wo Contrast  01/11/2013   CLINICAL DATA:  Fall.  Altered mental status.  Unresponsive patient.  EXAM: CT HEAD WITHOUT CONTRAST  TECHNIQUE: Contiguous axial images were obtained from the base of the skull through the vertex without intravenous contrast.  COMPARISON:  11/12/2012  FINDINGS: The brainstem, cerebellum, cerebral peduncles, thalamus, basal ganglia, basilar cisterns, and ventricular system appear within normal limits. No intracranial hemorrhage, mass lesion, or acute CVA.  Mucus retention cyst in the left maxillary sinus, with bilateral mucoperiosteal thickening in both maxillary sinuses and in several ethmoid air cells. Left mastoid effusion noted. No middle ear fluid observed.  IMPRESSION: 1. Chronic paranasal sinusitis.  No acute intracranial findings. 2. Left mastoid effusion.   Electronically Signed   By: Herbie Baltimore M.D.   On: 01/11/2013 21:28   Dg Chest Port 1 View  01/11/2013   CLINICAL DATA:  Altered mental status  EXAM: PORTABLE CHEST - 1 VIEW  COMPARISON:  12/27/2012; 12/03/2012  FINDINGS: Grossly unchanged enlarged cardiac silhouette and mediastinal contours given persistently reduced lung volumes. Overall improved aeration of the lungs with persistent perihilar and left basilar opacities. No new focal airspace opacities. No definite pleural effusion or pneumothorax. No evidence of edema. Unchanged bones. Interval removal of right upper extremity approach PICC line.  IMPRESSION: Grossly unchanged findings of cardiomegaly and perihilar and left basilar opacities, atelectasis versus infiltrate. Further  evaluation with a PA and lateral chest radiograph may be obtained as clinically indicated.   Electronically Signed   By: Holland Commons.D.  On: 01/11/2013 20:04    Microbiology: No results found for this or any previous visit (from the past 240 hour(s)).   Labs: Basic Metabolic Panel:  Recent Labs Lab 01/08/13 1150 01/11/13 2054  NA 133* 137  K 3.3* 4.0  CL 95* 101  CO2 28 30  GLUCOSE 131* 119*  BUN 18 12  CREATININE 0.85 0.72  CALCIUM 8.0* 8.3*   Liver Function Tests:  Recent Labs Lab 01/08/13 1150 01/11/13 2054  AST 50* 66*  ALT 24 28  ALKPHOS 162* 144*  BILITOT 2.6* 3.2*  PROT 6.5 6.6  ALBUMIN  --  2.3*   No results found for this basename: LIPASE, AMYLASE,  in the last 168 hours  Recent Labs Lab 01/08/13 1150 01/11/13 2054  AMMONIA 286* 38   CBC:  Recent Labs Lab 01/08/13 1150 01/11/13 1920  WBC 3.6 4.7  NEUTROABS 2.6 3.5  HGB 10.6* 10.9*  HCT 31.0* 32.0*  MCV 102* 100.9*  PLT 46* 75*   Cardiac Enzymes:  Recent Labs Lab 01/11/13 2054  CKTOTAL 503*  TROPONINI <0.30   BNP: BNP (last 3 results)  Recent Labs  12/03/12 1435 01/11/13 2055  PROBNP 297.7* 207.4*   CBG:  Recent Labs Lab 01/12/13 1638 01/12/13 2022 01/12/13 2356 01/13/13 0432 01/13/13 0742  GLUCAP 154* 163* 141* 146* 150*    Time coordinating discharge: Over 30 minutes

## 2013-01-13 NOTE — Progress Notes (Signed)
Clinical Social Work Department CLINICAL SOCIAL WORK PSYCHIATRY SERVICE LINE ASSESSMENT 01/13/2013  Patient:  Tony Boyd  Account:  [id number]  Admit Date:  01/11/2013  Clinical Social Worker:  Unk Lightning, LCSW  Date/Time:  01/13/2013 11:00 AM Referred by:  Physician  Date referred:  01/13/2013 Reason for Referral  Psychosocial assessment   Presenting Symptoms/Problems (In the person's/family's own words):   Psych consulted to determine capacity.   Abuse/Neglect/Trauma History (check all that apply)  Witness to trauma   Abuse/Neglect/Trauma Comments:   Patient was in [place] War and reports PTSD from events seen.   Psychiatric History (check all that apply)  Outpatient treatment   Psychiatric medications:  Paxil 20 mg   Current Mental Health Hospitalizations/Previous Mental Health History:   [facility]. [facility] reports [facility]   [facility]   [facility] and Date:   Current Medications:   Scheduled Meds:      . feeding supplement (ENSURE COMPLETE)  237 mL Oral BID  . ferrous sulfate  325 mg Oral QHS  . folic acid  1 mg Oral q morning - 10a  . furosemide  60 mg Oral BID  . insulin aspart  0-15 Units Subcutaneous Q4H  . lactulose  30 g Oral 5 X Daily  . levETIRAcetam  500 mg Oral BID  . levothyroxine  75 mcg Oral QAC breakfast  . multivitamin with minerals  1 tablet Oral Daily  . nadolol  40 mg Oral q morning - 10a  . pantoprazole  80 mg Oral Daily  . PARoxetine  20 mg Oral Daily  . potassium chloride SA  20 mEq Oral q morning - 10a  . rifaximin  550 mg Oral BID  . sodium chloride  3 mL Intravenous Q12H  . spironolactone  100 mg Oral q morning - 10a        Continuous Infusions:      PRN Meds:.sodium chloride, albuterol, ondansetron (ZOFRAN) IV, ondansetron, oxyCODONE, sodium chloride       Previous Impatient Admission/Date/Reason:   None reported    Emotional Health / Current Symptoms    Suicide/Self Harm  None reported   Suicide attempt in the past:   Patient denies any SI or HI.   Other harmful behavior:   None reported   Psychotic/Dissociative Symptoms  None reported   Other Psychotic/Dissociative Symptoms:    Attention/Behavioral Symptoms  Within Normal Limits   Other Attention / Behavioral Symptoms:   Patient engaged throughout assessment.    Cognitive Impairment  Within Normal Limits   Other Cognitive Impairment:   Patient alert and oriented.    Mood and Adjustment  Mood Congruent    Stress, Anxiety, Trauma, Any Recent Loss/Stressor  None reported   Anxiety (frequency):   N/A   Phobia (specify):   N/A   Compulsive behavior (specify):   N/A   Obsessive behavior (specify):   N/A   Other:   N/A   Substance Abuse/Use  History of substance use   SBIRT completed (please refer for detailed history):  N  Self-reported substance use:   Patient reports he has not drank alcohol or used any drugs in over 1 year. Patient reports EMS suspected him of using alcohol on day of admission but patient denies any substance use. Patient reports he used to smoke marijuana in the past with friends but denies any current use. Patient reports he takes all  medication as prescribed.   Urinary Drug Screen Completed:  Y Alcohol level:   <11    Environmental/Housing/Living Arrangement  Stable housing   Who is in the home:   Alone   Emergency contact:  [name]-dtr   Financial  Medicare  Medicaid   Patient's Strengths and Goals (patient's own words):   Patient reports dtrs are supportive and assist as needed.   Clinical Social Worker's Interpretive Summary:   CSW received referral to complete psychosocial assessment. CSW reviewed chart and met with patient at bedside. CSW introduced myself and explained role. Patient agreeable to complete assessment.    Patient reports that he lives alone in an  apartment close to his two dtrs. Patient was married for over 30 years but wife passed away about 7 years ago. Patient reports that he has HH now and plans to join [facility] where he can receive additional support. Patient reports he uses a walker and cane to ambulate at home but is fairly self-sufficient with needs.    Patient reports being diagnosed with PTSD due to witnessing events in the war. Patient receives medication management through the [place] and reports compliance with medication. Patient reports he hopes to gain additional support through [facility] as well but plans to return home at [place].    Patient agreeable to complete Mini Mental Status Exam (MMSE) and scored a 29/30. Patient reports he usually makes all of his own decisions and allows family to assist if needed.    CSW will continue to follow and will assist with any recommendations provided by psych MD.   Disposition:  Recommend Psych CSW continuing to support while in hospital   [place], [place] [phone]

## 2013-01-13 NOTE — Progress Notes (Signed)
Spoke with pt who referred me to  his daughter [facility] concerning Home Health. [name] states that the pt is with a  new HH agency, but she can not remember the name of the agency. [name] continued to state that [facility] agency will follow him(the pt) at discharge. I asked that she would call me back with [facility] agency's name. Tamera Punt is aware that the pt has been discharged. She will pick pt up.

## 2013-01-15 ENCOUNTER — Other Ambulatory Visit (HOSPITAL_COMMUNITY): Payer: Self-pay | Admitting: Internal Medicine

## 2013-01-15 ENCOUNTER — Other Ambulatory Visit: Payer: Self-pay

## 2013-01-15 DIAGNOSIS — R110 Nausea: Secondary | ICD-10-CM

## 2013-01-15 MED ORDER — SPIRONOLACTONE 100 MG PO TABS: 100.0000 mg | ORAL_TABLET | Freq: Every morning | ORAL | Status: DC

## 2013-01-15 MED ORDER — POTASSIUM CHLORIDE CRYS ER 20 MEQ PO TBCR: 20.0000 meq | EXTENDED_RELEASE_TABLET | Freq: Every morning | ORAL | Status: DC

## 2013-01-21 ENCOUNTER — Telehealth: Payer: Self-pay

## 2013-01-21 ENCOUNTER — Ambulatory Visit: Payer: Self-pay | Admitting: Gastroenterology

## 2013-01-21 MED ORDER — RIFAXIMIN 550 MG PO TABS: 550.0000 mg | ORAL_TABLET | Freq: Two times a day (BID) | ORAL | Status: DC

## 2013-01-21 MED ORDER — OXYCODONE HCL 10 MG PO TABS: 10.0000 mg | ORAL_TABLET | ORAL | Status: DC

## 2013-01-21 NOTE — Telephone Encounter (Signed)
Patient's daughter called requesting a refill on Lactulose, Xifaxan and Oxycodone  1.) Lactulose was filled on 01/08/2013, sent electronically for a year supply 2.) Sent Xifaxan electronically 3.) Oxycodone was filled on 01/13/13, per daughter she has the hard copy of that rx and it was never filled. Patient's daughter will bring in rx and pick-up a new rx. Patient did not fill medication because she would like for pain medications to come from PCPs office only. RX printed and placed at the front for pick-up, with notation RX dated 01/13/13 should be traded for today's rx

## 2013-01-21 NOTE — Telephone Encounter (Signed)
Patient's daughter bought in [place] given on 01/13/2013 from [place]

## 2013-01-25 ENCOUNTER — Other Ambulatory Visit: Payer: Self-pay | Admitting: Internal Medicine

## 2013-01-26 DIAGNOSIS — S32030A Wedge compression fracture of third lumbar vertebra, initial encounter for closed fracture: Secondary | ICD-10-CM

## 2013-01-26 HISTORY — DX: Wedge compression fracture of third lumbar vertebra, initial encounter for closed fracture: S32.030A

## 2013-02-09 ENCOUNTER — Other Ambulatory Visit (HOSPITAL_COMMUNITY): Payer: Self-pay | Admitting: Internal Medicine

## 2013-02-11 ENCOUNTER — Ambulatory Visit (INDEPENDENT_AMBULATORY_CARE_PROVIDER_SITE_OTHER): Payer: Medicare Other | Admitting: Nurse Practitioner

## 2013-02-11 ENCOUNTER — Encounter: Payer: Self-pay | Admitting: Nurse Practitioner

## 2013-02-11 VITALS — BP 132/78 | HR 76 | Resp 12 | Wt 254.0 lb

## 2013-02-11 DIAGNOSIS — E1165 Type 2 diabetes mellitus with hyperglycemia: Secondary | ICD-10-CM

## 2013-02-11 DIAGNOSIS — G40909 Epilepsy, unspecified, not intractable, without status epilepticus: Secondary | ICD-10-CM

## 2013-02-11 DIAGNOSIS — R601 Generalized edema: Secondary | ICD-10-CM

## 2013-02-11 DIAGNOSIS — K7460 Unspecified cirrhosis of liver: Secondary | ICD-10-CM

## 2013-02-11 DIAGNOSIS — R609 Edema, unspecified: Secondary | ICD-10-CM

## 2013-02-11 MED ORDER — NADOLOL 40 MG PO TABS: 40.0000 mg | ORAL_TABLET | Freq: Every morning | ORAL | Status: DC

## 2013-02-11 MED ORDER — LEVETIRACETAM 500 MG PO TABS: 500.0000 mg | ORAL_TABLET | Freq: Two times a day (BID) | ORAL | Status: DC

## 2013-02-11 MED ORDER — INSULIN GLARGINE 100 UNIT/ML ~~LOC~~ SOLN: 12.0000 [IU] | Freq: Every day | SUBCUTANEOUS | Status: DC

## 2013-02-11 NOTE — Patient Instructions (Addendum)
Will increase lasix to 80 mg twice daily Avoid foods and DRINKS high in sodium No more v8   STOP novolog Check fasting blood sugars and record lantus 12 units at night  Will get blood work today and to follow up in 2 weeks

## 2013-02-11 NOTE — Progress Notes (Signed)
Patient ID: Tony Boyd, male   DOB: 17-Sep-1955, 57 y.o.   MRN: [id number]    Allergies  Allergen Reactions  . Ativan [Lorazepam] Other (See Comments)    "hallucinations"  . Droperidol Other (See Comments)    unknown  . Penicillins Swelling  . Toradol [Ketorolac Tromethamine] Hives    Chief Complaint  Patient presents with  . Medical Managment of Chronic Issues    6 week follow-up   . Edema    abdominal, hand and leg swelling     HPI: Patient is a 57 y.o. male seen in the office today for routine follow up;  Noted swelling in hands, legs, and adbominal Drinks 4 16 oz of water a day Fluid restriction prior to hospitalization of 500 ML a day per pt now he reports he can drink whatever he wants; pt and daughter and trying to have him not eat food high in sodium per pt however he is drinking V8 large container - 3 a week   Taking lasix 60 BID; spironolactone daily  Daughter is laying out medications to ensure he is taking his medication  Currently talking lactulose 30 g 5 a day - having 6 BMs a day (some are minimal)  Blood sugars still good; only getting his lantus and not needed meal coverage only needs novolog minimally Most nights he gets 10-12 lantus  Review of Systems:  Review of Systems  Constitutional: Negative for fever and chills.  Respiratory: Negative for cough and shortness of breath.        Shortness of breath with exertion but this has improved  Cardiovascular: Positive for leg swelling. Negative for chest pain.  Gastrointestinal: Positive for diarrhea (and freq). Negative for heartburn, nausea (comes and goes; not new), vomiting, abdominal pain and constipation.  Genitourinary: Negative for dysuria, urgency and frequency.  Musculoskeletal: Positive for joint pain (knee and back pain which have been chronic).  Skin: Negative.   Neurological: Positive for weakness (but overall this has improved).  Psychiatric/Behavioral: Positive for memory loss. Negative for  depression. The patient is not nervous/anxious.      Past Medical History  Diagnosis Date  . Cirrhosis of liver   . Hepatitis C   . ETOH abuse   . Hypothyroid   . Psychosis   . Bipolar disorder   . Seizures   . Arthritis   . Back pain   . Coagulopathy     Hx of  . Thrombocytopenia     Hx of  . Pancytopenia     Hx of  . H/O hypokalemia   . Hyponatremia     Hx of  . Korsakoff psychosis   . H/O abdominal abscess   . H/O esophageal varices   . Metabolic encephalopathy   . Hepatic encephalopathy   . Urinary urgency   . H/O renal failure   . H/O ascites   . Altered mental status   . Anemia   . Ascites   . Shortness of breath   . Peripheral vascular disease   . GERD (gastroesophageal reflux disease)   . Thrombocytopenia, acquired 06/02/2012  . Unspecified hypothyroidism 06/02/2012  . Pain in joint, pelvic region and thigh   . Edema   . Obesity, unspecified   . Chest pain, unspecified   . Pneumonitis due to inhalation of food or vomitus   . Other convulsions   . Cellulitis and abscess of upper arm and forearm   . Chronic hepatitis C without mention of hepatic coma   .  Hypopotassemia   . Anemia, unspecified   . Other and unspecified coagulation defects   . Thrombocytopenia, unspecified   . Bipolar I disorder, most recent episode (or current) unspecified   . Unspecified psychosis   . Encephalopathy   . Esophageal varices without mention of bleeding   . Esophagitis, unspecified   . Abscess of liver(572.0)   . Chronic kidney disease, unspecified   . Lumbago   . Personal history of alcoholism   . Personal history of tobacco use, presenting hazards to health   . Cirrhosis of liver without mention of alcohol   . Pneumonitis due to inhalation of food or vomitus 09/19/2010  . Chronic hepatitis C without mention of hepatic coma   . Bipolar I disorder, most recent episode (or current) unspecified   . Unspecified psychosis   . Other ascites   . Personal history of tobacco  use, presenting hazards to health   . Cirrhosis of liver without mention of alcohol   . Type 2 diabetes mellitus with diabetic neuropathy    Past Surgical History  Procedure Laterality Date  . Colostomy    . Cholecystectomy    . Small intestine surgery    . Arm surgery      left arm  . US guided drain placement in ventral hernia abscess  07/21/2010  . Multiple picc line placements    . Esophagogastroduodenoscopy  06/28/2011    Procedure: ESOPHAGOGASTRODUODENOSCOPY (EGD);  Surgeon: Louis Meckel, MD;  Location: [facility];  Service: Endoscopy;  Laterality: N/A;  . Esophagogastroduodenoscopy N/A 05/06/2012    Procedure: ESOPHAGOGASTRODUODENOSCOPY (EGD);  Surgeon: Louis Meckel, MD;  Location: [facility];  Service: Endoscopy;  Laterality: N/A;  . Esophageal banding N/A 05/06/2012    Procedure: ESOPHAGEAL BANDING;  Surgeon: Louis Meckel, MD;  Location: [facility];  Service: Endoscopy;  Laterality: N/A;   Social History:   reports that he quit smoking about 4 years ago. His smoking use included Cigarettes. He smoked 0.00 packs per day. He has never used smokeless tobacco. He reports that he does not drink alcohol or use illicit drugs.  Family History  Problem Relation Age of Onset  . Heart disease Mother     MI  . Lung cancer Brother     Medications: Patient's Medications  New Prescriptions   No medications on file  Previous Medications   ALBUTEROL (PROVENTIL) (5 MG/ML) 0.5% NEBULIZER SOLUTION    Take 0.5 mLs (2.5 mg total) by nebulization every 4 (four) hours as needed for wheezing or shortness of breath.   ALPRAZOLAM (XANAX) 1 MG TABLET    Take 1 mg by mouth at bedtime.   ALUM & MAG HYDROXIDE-SIMETH (MAALOX/MYLANTA) 200-200-20 MG/5ML SUSPENSION    Take 30 mLs by mouth every 6 (six) hours as needed.   FERROUS SULFATE 325 (65 FE) MG TABLET    Take 325 mg by mouth at bedtime.   FOLIC ACID (FOLVITE) 1 MG TABLET    Take 1 mg by mouth every morning.    FUROSEMIDE (LASIX) 40 MG  TABLET    TAKE 1 & 1/2 TABLET BY MOUTH TWICE DAILY   INSULIN ASPART (NOVOLOG) 100 UNIT/ML INJECTION    Inject 5 Units into the skin 3 (three) times daily with meals. Pt to take 5 units of novolog if blood sugar is 150 or above with meals; No novolog if blood sugar under 150   INSULIN GLARGINE (LANTUS) 100 UNIT/ML INJECTION    Inject 12-28 Units into the skin at bedtime.  LACTULOSE (CHRONULAC) 10 GM/15ML SOLUTION    Take 45 mLs (30 g total) by mouth 5 (five) times daily.   LEVETIRACETAM (KEPPRA) 500 MG TABLET    Take 500 mg by mouth 2 (two) times daily.   LEVOTHYROXINE (SYNTHROID, LEVOTHROID) 75 MCG TABLET    Take 75 mcg by mouth every morning.    NADOLOL (CORGARD) 40 MG TABLET    Take 40 mg by mouth every morning.   OMEPRAZOLE (PRILOSEC) 20 MG CAPSULE    Take 20 mg by mouth every morning.    ONDANSETRON (ZOFRAN) 4 MG TABLET    Take 1 tablet (4 mg total) by mouth every 6 (six) hours as needed for nausea.   OXYCODONE HCL 10 MG TABS    Take 1 tablet (10 mg total) by mouth every 4 (four) hours.   PAROXETINE (PAXIL) 20 MG TABLET    TAKE 1 TABLET BY MOUTH EVERY DAY   POTASSIUM CHLORIDE SA (K-DUR,KLOR-CON) 20 MEQ TABLET    Take 1 tablet (20 mEq total) by mouth every morning.   RIFAXIMIN (XIFAXAN) 550 MG TABS TABLET    Take 1 tablet (550 mg total) by mouth 2 (two) times daily.   RISPERIDONE (RISPERDAL) 0.25 MG TABLET    Take 0.25 mg by mouth 2 (two) times daily.   SPIRONOLACTONE (ALDACTONE) 100 MG TABLET    Take 1 tablet (100 mg total) by mouth every morning.  Modified Medications   No medications on file  Discontinued Medications   FUROSEMIDE (LASIX) 20 MG TABLET    Take 3 tablets (60 mg total) by mouth 2 (two) times daily.     Physical Exam:  Filed Vitals:   02/11/13 0922  BP: 132/78  Pulse: 76  Resp: 12  Weight: 254 lb (115.214 kg)  SpO2: 95%    Physical Exam  Constitutional: He is well-developed, well-nourished, and in no distress. No distress.  Cardiovascular: Normal rate, regular  rhythm and normal heart sounds.   Pulmonary/Chest: Effort normal and breath sounds normal. No respiratory distress.  Abdominal: Soft. Bowel sounds are normal.  Musculoskeletal: He exhibits edema (into thigh). He exhibits no tenderness.  Neurological: He is alert. Gait normal.  Skin: Skin is warm and dry. He is not diaphoretic.  jaundice   Psychiatric: Affect normal.     Labs reviewed: Basic Metabolic Panel:  Recent Labs  45/40/98 1135  09/17/12 0146  11/12/12 0235  12/10/12 0525  12/11/12 0515  12/28/12 1630  12/30/12 0345 01/08/13 1150 01/11/13 2054  NA  --   < > 120*  < > 127*  < > 127*  < > 124*  < >  --   < > 133* 133* 137  K  --   < > 4.0  < > 3.3*  < > 3.5  --  3.4*  < >  --   < > 3.1* 3.3* 4.0  CL  --   < > 89*  < > 89*  < > 95*  --  92*  < >  --   < > 95* 95* 101  CO2  --   < > 23  < > 31  < > 29  --  28  < >  --   < > 33* 28 30  GLUCOSE  --   < > 155*  < > 157*  < > 116*  --  98  < >  --   < > 153* 131* 119*  Boyd  --   < >  13  < > 12  < > 10  --  10  < >  --   < > 20 18 12   CREATININE  --   < > 0.79  < > 0.75  < > 0.65  --  0.70  < >  --   < > 1.14 0.85 0.72  CALCIUM  --   < > 8.5  < > 8.2*  < > 8.3*  --  8.2*  < >  --   < > 8.8 8.0* 8.3*  MG  --   < >  --   < >  --   < > 1.7  --  1.5  --  1.6  --   --   --   --   TSH 2.323  --  2.243  --  3.470  --   --   --   --   --   --   --   --   --   --   < > = values in this interval not displayed. Liver Function Tests:  Recent Labs  12/29/12 1430 12/30/12 0345 01/08/13 1150 01/11/13 2054  AST 48* 52* 50* 66*  ALT 22 23 24 28   ALKPHOS 157* 163* 162* 144*  BILITOT 2.5* 2.1* 2.6* 3.2*  PROT 6.6 6.5 6.5 6.6  ALBUMIN 2.3* 2.2*  --  2.3*    Recent Labs  09/30/12 0454 10/06/12 1240 12/28/12 1551  LIPASE 47 33 49    Recent Labs  12/29/12 0600 01/08/13 1150 01/11/13 2054  AMMONIA 84* 286* 38   CBC:  Recent Labs  12/29/12 1430 01/08/13 1150 01/11/13 1920  WBC 4.8 3.6 4.7  NEUTROABS 3.1 2.6 3.5  HGB  10.9* 10.6* 10.9*  HCT 30.7* 31.0* 32.0*  MCV 100.0 102* 100.9*  PLT 41* 46* 75*   Lipid Panel: No results found for this basename: CHOL, HDL, LDLCALC, TRIG, CHOLHDL, LDLDIRECT,  in the last 8760 hours TSH:  Recent Labs  08/12/12 1135 09/17/12 0146 11/12/12 0235  TSH 2.323 2.243 3.470   A1C: Lab Results  Component Value Date   HGBA1C 5.5 01/08/2013    Assessment/Plan 1. Diabetes mellitus type 2, uncontrolled, with complications -A1c has improved from 10 to 5.5 -will have pt stop taking novolog -start taking lantus 12 units nightly (not on an adjustment scale per daughter) and educated how medication takes 24 hours to work (prior was adjusting insulin based on night time sugars  2. Anasarca -to read labs and decrease salt intake -no more V8s -to have meals be low sodium when ordering from food service -will increase lasix to 80 mg BID for 2 weeks and then decrease back to 60 mg BID -conts spironolactone   3. Seizure disorder - levETIRAcetam (KEPPRA) 500 MG tablet; Take 1 tablet (500 mg total) by mouth 2 (two) times daily.  Dispense: 180 tablet; Refill: 1  4. Cirrhosis -mentation appears to be at baseline -conts lactulose and xifaxan  - Ammonia - Comprehensive metabolic panel  Follow up in 3 weeks due to edema and to check BMP

## 2013-02-12 LAB — COMPREHENSIVE METABOLIC PANEL
AST: 46 IU/L — ABNORMAL HIGH (ref 0–40)
Albumin: 2.2 g/dL — ABNORMAL LOW (ref 3.5–5.5)
BUN: 8 mg/dL (ref 6–24)
CO2: 24 mmol/L (ref 18–29)
Calcium: 7.8 mg/dL — ABNORMAL LOW (ref 8.7–10.2)
Creatinine, Ser: 0.63 mg/dL — ABNORMAL LOW (ref 0.76–1.27)
Globulin, Total: 3.6 g/dL (ref 1.5–4.5)
Sodium: 132 mmol/L — ABNORMAL LOW (ref 134–144)

## 2013-02-12 LAB — AMMONIA: Ammonia: 181 ug/dL (ref 27–102)

## 2013-02-17 ENCOUNTER — Encounter (HOSPITAL_COMMUNITY): Payer: Self-pay | Admitting: Emergency Medicine

## 2013-02-17 ENCOUNTER — Inpatient Hospital Stay (HOSPITAL_COMMUNITY)
Admission: EM | Admit: 2013-02-17 | Discharge: 2013-02-25 | DRG: 432 | Disposition: A | Discharge status: Home Health | Payer: Medicare Other | Attending: Internal Medicine | Admitting: Internal Medicine

## 2013-02-17 DIAGNOSIS — Z6841 Body Mass Index (BMI) 40.0 and over, adult: Secondary | ICD-10-CM

## 2013-02-17 DIAGNOSIS — K7290 Hepatic failure, unspecified without coma: Secondary | ICD-10-CM | POA: Diagnosis present

## 2013-02-17 DIAGNOSIS — K7030 Alcoholic cirrhosis of liver without ascites: Principal | ICD-10-CM | POA: Diagnosis present

## 2013-02-17 DIAGNOSIS — E871 Hypo-osmolality and hyponatremia: Secondary | ICD-10-CM | POA: Diagnosis present

## 2013-02-17 DIAGNOSIS — K319 Disease of stomach and duodenum, unspecified: Secondary | ICD-10-CM | POA: Diagnosis present

## 2013-02-17 DIAGNOSIS — Z801 Family history of malignant neoplasm of trachea, bronchus and lung: Secondary | ICD-10-CM

## 2013-02-17 DIAGNOSIS — R109 Unspecified abdominal pain: Secondary | ICD-10-CM | POA: Diagnosis present

## 2013-02-17 DIAGNOSIS — B1920 Unspecified viral hepatitis C without hepatic coma: Secondary | ICD-10-CM

## 2013-02-17 DIAGNOSIS — G40909 Epilepsy, unspecified, not intractable, without status epilepticus: Secondary | ICD-10-CM

## 2013-02-17 DIAGNOSIS — Z8249 Family history of ischemic heart disease and other diseases of the circulatory system: Secondary | ICD-10-CM

## 2013-02-17 DIAGNOSIS — F1010 Alcohol abuse, uncomplicated: Secondary | ICD-10-CM | POA: Diagnosis present

## 2013-02-17 DIAGNOSIS — E1149 Type 2 diabetes mellitus with other diabetic neurological complication: Secondary | ICD-10-CM | POA: Diagnosis present

## 2013-02-17 DIAGNOSIS — Z794 Long term (current) use of insulin: Secondary | ICD-10-CM

## 2013-02-17 DIAGNOSIS — Z9089 Acquired absence of other organs: Secondary | ICD-10-CM

## 2013-02-17 DIAGNOSIS — E440 Moderate protein-calorie malnutrition: Secondary | ICD-10-CM

## 2013-02-17 DIAGNOSIS — R0602 Shortness of breath: Secondary | ICD-10-CM | POA: Diagnosis present

## 2013-02-17 DIAGNOSIS — F411 Generalized anxiety disorder: Secondary | ICD-10-CM | POA: Diagnosis present

## 2013-02-17 DIAGNOSIS — Z933 Colostomy status: Secondary | ICD-10-CM

## 2013-02-17 DIAGNOSIS — D649 Anemia, unspecified: Secondary | ICD-10-CM | POA: Diagnosis present

## 2013-02-17 DIAGNOSIS — R791 Abnormal coagulation profile: Secondary | ICD-10-CM | POA: Diagnosis present

## 2013-02-17 DIAGNOSIS — E43 Unspecified severe protein-calorie malnutrition: Secondary | ICD-10-CM | POA: Diagnosis present

## 2013-02-17 DIAGNOSIS — R601 Generalized edema: Secondary | ICD-10-CM | POA: Diagnosis present

## 2013-02-17 DIAGNOSIS — E1165 Type 2 diabetes mellitus with hyperglycemia: Secondary | ICD-10-CM

## 2013-02-17 DIAGNOSIS — E1142 Type 2 diabetes mellitus with diabetic polyneuropathy: Secondary | ICD-10-CM | POA: Diagnosis present

## 2013-02-17 DIAGNOSIS — F319 Bipolar disorder, unspecified: Secondary | ICD-10-CM | POA: Diagnosis present

## 2013-02-17 DIAGNOSIS — D696 Thrombocytopenia, unspecified: Secondary | ICD-10-CM | POA: Diagnosis present

## 2013-02-17 DIAGNOSIS — E669 Obesity, unspecified: Secondary | ICD-10-CM | POA: Diagnosis present

## 2013-02-17 DIAGNOSIS — I8510 Secondary esophageal varices without bleeding: Secondary | ICD-10-CM | POA: Diagnosis present

## 2013-02-17 DIAGNOSIS — E039 Hypothyroidism, unspecified: Secondary | ICD-10-CM | POA: Diagnosis present

## 2013-02-17 DIAGNOSIS — N5089 Other specified disorders of the male genital organs: Secondary | ICD-10-CM

## 2013-02-17 DIAGNOSIS — K766 Portal hypertension: Secondary | ICD-10-CM | POA: Diagnosis present

## 2013-02-17 DIAGNOSIS — I872 Venous insufficiency (chronic) (peripheral): Secondary | ICD-10-CM | POA: Diagnosis present

## 2013-02-17 DIAGNOSIS — R404 Transient alteration of awareness: Secondary | ICD-10-CM | POA: Diagnosis present

## 2013-02-17 DIAGNOSIS — Z8659 Personal history of other mental and behavioral disorders: Secondary | ICD-10-CM

## 2013-02-17 DIAGNOSIS — Z87891 Personal history of nicotine dependence: Secondary | ICD-10-CM

## 2013-02-17 DIAGNOSIS — K7460 Unspecified cirrhosis of liver: Secondary | ICD-10-CM

## 2013-02-17 DIAGNOSIS — F1021 Alcohol dependence, in remission: Secondary | ICD-10-CM

## 2013-02-17 DIAGNOSIS — D689 Coagulation defect, unspecified: Secondary | ICD-10-CM | POA: Diagnosis present

## 2013-02-17 DIAGNOSIS — R188 Other ascites: Secondary | ICD-10-CM | POA: Diagnosis present

## 2013-02-17 DIAGNOSIS — R609 Edema, unspecified: Secondary | ICD-10-CM | POA: Diagnosis present

## 2013-02-17 NOTE — ED Notes (Signed)
Bed: ZO10 Expected date: 02/17/13 Expected time: 11:14 PM Means of arrival: Ambulance Comments: 57 yo M  Genital bleeding

## 2013-02-17 NOTE — ED Notes (Signed)
Per EMS report: pt from [place].  Pt reports testiscal bleeding.  Pt denies any trauma.  EMS noted pt's groin was swollen.  Pt also complains of pain the area.  Pt has edema in legs but that is normal for the pt.  Pt a/o x 4.

## 2013-02-18 ENCOUNTER — Other Ambulatory Visit: Payer: Self-pay

## 2013-02-18 ENCOUNTER — Observation Stay (HOSPITAL_COMMUNITY): Payer: Medicare Other

## 2013-02-18 ENCOUNTER — Emergency Department (HOSPITAL_COMMUNITY): Payer: Medicare Other

## 2013-02-18 ENCOUNTER — Inpatient Hospital Stay (HOSPITAL_COMMUNITY): Payer: Medicare Other

## 2013-02-18 DIAGNOSIS — R188 Other ascites: Secondary | ICD-10-CM

## 2013-02-18 DIAGNOSIS — K7460 Unspecified cirrhosis of liver: Secondary | ICD-10-CM

## 2013-02-18 DIAGNOSIS — K7290 Hepatic failure, unspecified without coma: Secondary | ICD-10-CM

## 2013-02-18 DIAGNOSIS — R609 Edema, unspecified: Secondary | ICD-10-CM

## 2013-02-18 LAB — COMPREHENSIVE METABOLIC PANEL
ALT: 21 U/L (ref 0–53)
Albumin: 1.9 g/dL — ABNORMAL LOW (ref 3.5–5.2)
Alkaline Phosphatase: 155 U/L — ABNORMAL HIGH (ref 39–117)
BUN: 12 mg/dL (ref 6–23)
CO2: 29 mEq/L (ref 19–32)
Chloride: 98 mEq/L (ref 96–112)
Creatinine, Ser: 0.8 mg/dL (ref 0.50–1.35)
Potassium: 3.7 mEq/L (ref 3.5–5.1)
Total Bilirubin: 2.3 mg/dL — ABNORMAL HIGH (ref 0.3–1.2)

## 2013-02-18 LAB — GLUCOSE, CAPILLARY
Glucose-Capillary: 159 mg/dL — ABNORMAL HIGH (ref 70–99)
Glucose-Capillary: 195 mg/dL — ABNORMAL HIGH (ref 70–99)
Glucose-Capillary: 151 mg/dL — ABNORMAL HIGH (ref 70–99)

## 2013-02-18 LAB — CBC WITH DIFFERENTIAL/PLATELET
Basophils Absolute: 0 10*3/uL (ref 0.0–0.1)
Basophils Relative: 0 % (ref 0–1)
Eosinophils Absolute: 0.2 10*3/uL (ref 0.0–0.7)
HCT: 27.6 % — ABNORMAL LOW (ref 39.0–52.0)
Lymphs Abs: 1.1 10*3/uL (ref 0.7–4.0)
MCH: 34.7 pg — ABNORMAL HIGH (ref 26.0–34.0)
MCHC: 33.7 g/dL (ref 30.0–36.0)
MCV: 103 fL — ABNORMAL HIGH (ref 78.0–100.0)
Monocytes Absolute: 0.4 10*3/uL (ref 0.1–1.0)
Monocytes Relative: 10 % (ref 3–12)
Neutro Abs: 2.1 10*3/uL (ref 1.7–7.7)
Platelets: 48 10*3/uL — ABNORMAL LOW (ref 150–400)
RDW: 17.1 % — ABNORMAL HIGH (ref 11.5–15.5)
WBC: 3.8 10*3/uL — ABNORMAL LOW (ref 4.0–10.5)

## 2013-02-18 LAB — URINALYSIS, ROUTINE W REFLEX MICROSCOPIC
Glucose, UA: NEGATIVE mg/dL
Hgb urine dipstick: NEGATIVE
Ketones, ur: NEGATIVE mg/dL
Leukocytes, UA: NEGATIVE
Protein, ur: NEGATIVE mg/dL
Urobilinogen, UA: 1 mg/dL (ref 0.0–1.0)
pH: 7 (ref 5.0–8.0)

## 2013-02-18 LAB — PRO B NATRIURETIC PEPTIDE: Pro B Natriuretic peptide (BNP): 985.8 pg/mL — ABNORMAL HIGH (ref 0–125)

## 2013-02-18 MED ORDER — OXYCODONE HCL 5 MG PO TABS
10.0000 mg | ORAL_TABLET | ORAL | Status: DC
  Administered 2013-02-18 – 2013-02-23 (×29): 10 mg via ORAL
  Filled 2013-02-18 (×29): qty 2

## 2013-02-18 MED ORDER — ONDANSETRON HCL 4 MG PO TABS: 4.0000 mg | ORAL_TABLET | Freq: Four times a day (QID) | ORAL | Status: DC | PRN

## 2013-02-18 MED ORDER — MORPHINE SULFATE 4 MG/ML IJ SOLN
6.0000 mg | Freq: Once | INTRAMUSCULAR | Status: AC
  Administered 2013-02-18: 6 mg via INTRAVENOUS
  Filled 2013-02-18: qty 2

## 2013-02-18 MED ORDER — ALBUMIN HUMAN 25 % IV SOLN
12.5000 g | Freq: Four times a day (QID) | INTRAVENOUS | Status: DC
  Administered 2013-02-18 – 2013-02-19 (×5): 12.5 g via INTRAVENOUS
  Filled 2013-02-18 (×10): qty 50

## 2013-02-18 MED ORDER — RISPERIDONE 0.25 MG PO TABS
0.2500 mg | ORAL_TABLET | Freq: Two times a day (BID) | ORAL | Status: DC
  Administered 2013-02-18 – 2013-02-25 (×15): 0.25 mg via ORAL
  Filled 2013-02-18 (×17): qty 1

## 2013-02-18 MED ORDER — FUROSEMIDE 10 MG/ML IJ SOLN
40.0000 mg | Freq: Two times a day (BID) | INTRAMUSCULAR | Status: DC
  Administered 2013-02-18 – 2013-02-19 (×4): 40 mg via INTRAVENOUS
  Filled 2013-02-18 (×6): qty 4

## 2013-02-18 MED ORDER — SPIRONOLACTONE 100 MG PO TABS
100.0000 mg | ORAL_TABLET | Freq: Every morning | ORAL | Status: DC
  Administered 2013-02-18: 100 mg via ORAL
  Filled 2013-02-18 (×2): qty 1

## 2013-02-18 MED ORDER — LEVETIRACETAM 500 MG PO TABS
500.0000 mg | ORAL_TABLET | Freq: Two times a day (BID) | ORAL | Status: DC
  Administered 2013-02-18 – 2013-02-25 (×15): 500 mg via ORAL
  Filled 2013-02-18 (×16): qty 1

## 2013-02-18 MED ORDER — FERROUS SULFATE 325 (65 FE) MG PO TABS
325.0000 mg | ORAL_TABLET | Freq: Every day | ORAL | Status: DC
  Administered 2013-02-18 – 2013-02-24 (×7): 325 mg via ORAL
  Filled 2013-02-18 (×8): qty 1

## 2013-02-18 MED ORDER — OXYCODONE HCL 10 MG PO TABS: 10.0000 mg | ORAL_TABLET | ORAL | Status: DC

## 2013-02-18 MED ORDER — ALUM & MAG HYDROXIDE-SIMETH 200-200-20 MG/5ML PO SUSP: 30.0000 mL | Freq: Four times a day (QID) | ORAL | Status: DC | PRN

## 2013-02-18 MED ORDER — INSULIN GLARGINE 100 UNIT/ML ~~LOC~~ SOLN
12.0000 [IU] | Freq: Every day | SUBCUTANEOUS | Status: DC
  Administered 2013-02-18 – 2013-02-24 (×7): 12 [IU] via SUBCUTANEOUS
  Filled 2013-02-18 (×8): qty 0.12

## 2013-02-18 MED ORDER — LEVOTHYROXINE SODIUM 75 MCG PO TABS
75.0000 ug | ORAL_TABLET | Freq: Every morning | ORAL | Status: DC
  Administered 2013-02-18 – 2013-02-25 (×8): 75 ug via ORAL
  Filled 2013-02-18 (×8): qty 1

## 2013-02-18 MED ORDER — IOHEXOL 300 MG/ML  SOLN
25.0000 mL | INTRAMUSCULAR | Status: AC
  Administered 2013-02-18 (×2): 25 mL via ORAL

## 2013-02-18 MED ORDER — POTASSIUM CHLORIDE CRYS ER 20 MEQ PO TBCR
20.0000 meq | EXTENDED_RELEASE_TABLET | Freq: Every morning | ORAL | Status: DC
  Administered 2013-02-18 – 2013-02-25 (×8): 20 meq via ORAL
  Filled 2013-02-18 (×8): qty 1

## 2013-02-18 MED ORDER — NADOLOL 40 MG PO TABS
40.0000 mg | ORAL_TABLET | Freq: Every morning | ORAL | Status: DC
  Administered 2013-02-18 – 2013-02-25 (×8): 40 mg via ORAL
  Filled 2013-02-18 (×8): qty 1

## 2013-02-18 MED ORDER — PANTOPRAZOLE SODIUM 40 MG PO TBEC
40.0000 mg | DELAYED_RELEASE_TABLET | Freq: Every day | ORAL | Status: DC
  Administered 2013-02-18 – 2013-02-25 (×8): 40 mg via ORAL
  Filled 2013-02-18 (×8): qty 1

## 2013-02-18 MED ORDER — PAROXETINE HCL 20 MG PO TABS
20.0000 mg | ORAL_TABLET | Freq: Every day | ORAL | Status: DC
Start: 2013-02-18 — End: 2013-02-25
  Administered 2013-02-18 – 2013-02-25 (×8): 20 mg via ORAL
  Filled 2013-02-18 (×8): qty 1

## 2013-02-18 MED ORDER — LACTULOSE 10 GM/15ML PO SOLN
30.0000 g | Freq: Every day | ORAL | Status: DC
  Administered 2013-02-18 – 2013-02-25 (×35): 30 g via ORAL
  Filled 2013-02-18 (×44): qty 45

## 2013-02-18 MED ORDER — ALBUTEROL SULFATE (2.5 MG/3ML) 0.083% IN NEBU
2.5000 mg | INHALATION_SOLUTION | RESPIRATORY_TRACT | Status: DC | PRN
  Administered 2013-02-18: 2.5 mg via RESPIRATORY_TRACT
  Filled 2013-02-18 (×2): qty 0.5

## 2013-02-18 MED ORDER — RIFAXIMIN 550 MG PO TABS
550.0000 mg | ORAL_TABLET | Freq: Two times a day (BID) | ORAL | Status: DC
  Administered 2013-02-18 – 2013-02-25 (×15): 550 mg via ORAL
  Filled 2013-02-18 (×16): qty 1

## 2013-02-18 MED ORDER — ALPRAZOLAM 1 MG PO TABS
1.0000 mg | ORAL_TABLET | Freq: Two times a day (BID) | ORAL | Status: DC
  Administered 2013-02-18 – 2013-02-23 (×11): 1 mg via ORAL
  Filled 2013-02-18 (×11): qty 1

## 2013-02-18 MED ORDER — FOLIC ACID 1 MG PO TABS
1.0000 mg | ORAL_TABLET | Freq: Every morning | ORAL | Status: DC
  Administered 2013-02-18 – 2013-02-25 (×8): 1 mg via ORAL
  Filled 2013-02-18 (×8): qty 1

## 2013-02-18 MED ORDER — IOHEXOL 300 MG/ML  SOLN
100.0000 mL | Freq: Once | INTRAMUSCULAR | Status: AC | PRN
  Administered 2013-02-18: 100 mL via INTRAVENOUS

## 2013-02-18 MED ORDER — FUROSEMIDE 40 MG PO TABS
60.0000 mg | ORAL_TABLET | Freq: Two times a day (BID) | ORAL | Status: DC
  Filled 2013-02-18 (×4): qty 1

## 2013-02-18 NOTE — Progress Notes (Signed)
Patient admitted earlier this morning. Seen after arrival to floor. Significant anasarca, unable to tap much fluid. We'll try IV albumin plus Lasix in attempts to decrease third spacing. Appreciate gastroenterology help. Checking CT of abdomen and pelvis to rule out obstructive mass or venous thrombosis.

## 2013-02-18 NOTE — ED Provider Notes (Signed)
CSN: [id number]     Arrival date & time 02/17/13  2330 History   First MD Initiated Contact with Patient 02/17/13 2341     Chief Complaint  Patient presents with  . Groin Pain   (Consider location/radiation/quality/duration/timing/severity/associated sxs/prior Treatment) HPI Comments: 57 yo male with cirrhosis, hep C, encephalopathy, DM, anemia, etoh hx presents with abdominal distension and scrotal edema with mild bleeding worsening the past few days to the point where he cannot walk.  Hx of similar but this is more severe.  No heart failure or kidney issues.  Pt has never had fluid drained in the past.    Patient is a 57 y.o. male presenting with groin pain. The history is provided by the patient.  Groin Pain Pertinent negatives include no chest pain, no abdominal pain, no headaches and no shortness of breath.    Past Medical History  Diagnosis Date  . Cirrhosis of liver   . Hepatitis C   . ETOH abuse   . Hypothyroid   . Psychosis   . Bipolar disorder   . Seizures   . Arthritis   . Back pain   . Coagulopathy     Hx of  . Thrombocytopenia     Hx of  . Pancytopenia     Hx of  . H/O hypokalemia   . Hyponatremia     Hx of  . Korsakoff psychosis   . H/O abdominal abscess   . H/O esophageal varices   . Metabolic encephalopathy   . Hepatic encephalopathy   . Urinary urgency   . H/O renal failure   . H/O ascites   . Altered mental status   . Anemia   . Ascites   . Shortness of breath   . Peripheral vascular disease   . GERD (gastroesophageal reflux disease)   . Thrombocytopenia, acquired 06/02/2012  . Unspecified hypothyroidism 06/02/2012  . Pain in joint, pelvic region and thigh   . Edema   . Obesity, unspecified   . Chest pain, unspecified   . Pneumonitis due to inhalation of food or vomitus   . Other convulsions   . Cellulitis and abscess of upper arm and forearm   . Chronic hepatitis C without mention of hepatic coma   . Hypopotassemia   . Anemia, unspecified    . Other and unspecified coagulation defects   . Thrombocytopenia, unspecified   . Bipolar I disorder, most recent episode (or current) unspecified   . Unspecified psychosis   . Encephalopathy   . Esophageal varices without mention of bleeding   . Esophagitis, unspecified   . Abscess of liver(572.0)   . Chronic kidney disease, unspecified   . Lumbago   . Personal history of alcoholism   . Personal history of tobacco use, presenting hazards to health   . Cirrhosis of liver without mention of alcohol   . Pneumonitis due to inhalation of food or vomitus 09/19/2010  . Chronic hepatitis C without mention of hepatic coma   . Bipolar I disorder, most recent episode (or current) unspecified   . Unspecified psychosis   . Other ascites   . Personal history of tobacco use, presenting hazards to health   . Cirrhosis of liver without mention of alcohol   . Type 2 diabetes mellitus with diabetic neuropathy    Past Surgical History  Procedure Laterality Date  . Colostomy    . Cholecystectomy    . Small intestine surgery    . Arm surgery  left arm  . US guided drain placement in ventral hernia abscess  07/21/2010  . Multiple picc line placements    . Esophagogastroduodenoscopy  06/28/2011    Procedure: ESOPHAGOGASTRODUODENOSCOPY (EGD);  Surgeon: Louis Meckel, MD;  Location: [facility];  Service: Endoscopy;  Laterality: N/A;  . Esophagogastroduodenoscopy N/A 05/06/2012    Procedure: ESOPHAGOGASTRODUODENOSCOPY (EGD);  Surgeon: Louis Meckel, MD;  Location: [facility];  Service: Endoscopy;  Laterality: N/A;  . Esophageal banding N/A 05/06/2012    Procedure: ESOPHAGEAL BANDING;  Surgeon: Louis Meckel, MD;  Location: [facility];  Service: Endoscopy;  Laterality: N/A;   Family History  Problem Relation Age of Onset  . Heart disease Mother     MI  . Lung cancer Brother    History  Substance Use Topics  . Smoking status: Former Smoker    Types: Cigarettes    Quit date:  02/15/2008  . Smokeless tobacco: Never Used  . Alcohol Use: No     Comment: Quit drinking 1 year ago.      Review of Systems  Constitutional: Positive for fatigue. Negative for fever and chills.  HENT: Negative for congestion.   Eyes: Negative for visual disturbance.  Respiratory: Negative for shortness of breath.   Cardiovascular: Positive for leg swelling. Negative for chest pain.  Gastrointestinal: Positive for abdominal distention. Negative for vomiting and abdominal pain.  Genitourinary: Positive for scrotal swelling. Negative for dysuria and flank pain.  Musculoskeletal: Negative for back pain, neck pain and neck stiffness.  Skin: Positive for wound. Negative for rash.  Neurological: Negative for light-headedness and headaches.    Allergies  Ativan; Droperidol; Penicillins; and Toradol  Home Medications   Current Outpatient Rx  Name  Route  Sig  Dispense  Refill  . Oxycodone HCl 10 MG TABS   Oral   Take 1 tablet (10 mg total) by mouth every 4 (four) hours.   180 tablet   0    BP 121/45  Pulse 76  Temp(Src) 98.3 F (36.8 C) (Oral)  Resp 16  Ht 5\' 8"  (1.727 m)  SpO2 98% Physical Exam  Nursing note and vitals reviewed. Constitutional: He is oriented to person, place, and time. He appears well-developed and well-nourished.  HENT:  Head: Normocephalic and atraumatic.  Eyes: Conjunctivae are normal. Right eye exhibits no discharge. Left eye exhibits no discharge.  Neck: Normal range of motion. Neck supple. No tracheal deviation present.  Cardiovascular: Normal rate and regular rhythm.   Pulmonary/Chest: Effort normal and breath sounds normal.  Abdominal: Soft. He exhibits distension and ascites. There is no tenderness. There is no guarding.  Genitourinary:  Significant edema to scrotum and pelvic region  Musculoskeletal: He exhibits edema.  Neurological: He is alert and oriented to person, place, and time.  Skin: Skin is warm. No rash noted.  Psychiatric: He has  a normal mood and affect.    ED Course  Procedures (including critical care time) Labs Review Labs Reviewed  COMPREHENSIVE METABOLIC PANEL - Abnormal; Notable for the following:    Sodium 132 (*)    Glucose, Bld 147 (*)    Calcium 8.1 (*)    Albumin 1.9 (*)    AST 46 (*)    Alkaline Phosphatase 155 (*)    Total Bilirubin 2.3 (*)    All other components within normal limits  CBC WITH DIFFERENTIAL - Abnormal; Notable for the following:    WBC 3.8 (*)    RBC 2.68 (*)    Hemoglobin 9.3 (*)  HCT 27.6 (*)    MCV 103.0 (*)    MCH 34.7 (*)    RDW 17.1 (*)    Platelets 48 (*)    Eosinophils Relative 6 (*)    All other components within normal limits  PROTIME-INR - Abnormal; Notable for the following:    Prothrombin Time 21.1 (*)    INR 1.89 (*)    All other components within normal limits  PRO B NATRIURETIC PEPTIDE - Abnormal; Notable for the following:    Pro B Natriuretic peptide (BNP) 985.8 (*)    All other components within normal limits  GLUCOSE, CAPILLARY - Abnormal; Notable for the following:    Glucose-Capillary 108 (*)    All other components within normal limits  BODY FLUID CULTURE  URINALYSIS, ROUTINE W REFLEX MICROSCOPIC  BODY FLUID CELL COUNT WITH DIFFERENTIAL  LACTATE DEHYDROGENASE, BODY FLUID  PROTEIN, BODY FLUID  CG4 I-STAT (LACTIC ACID)   Imaging Review Dg Chest 2 View  02/18/2013   CLINICAL DATA:  Shortness of breath and groin swelling.  EXAM: CHEST  2 VIEW  COMPARISON:  Chest radiograph performed 01/11/2013  FINDINGS: The lungs are relatively well-aerated. Mild vascular congestion is noted, without significant pulmonary edema. Mild bilateral atelectasis is seen. There is no evidence of pleural effusion or pneumothorax.  The heart is borderline enlarged. No acute osseous abnormalities are seen.  IMPRESSION: Mild vascular congestion and borderline cardiomegaly; mild bilateral atelectasis seen.   Electronically Signed   By: Roanna Raider M.D.   On: 02/18/2013  00:52    EKG Interpretation    Date/Time:  Tuesday February 17 2013 23:33:21 EST Ventricular Rate:  81 PR Interval:  67 QRS Duration: 133 QT Interval:  495 QTC Calculation: 575 R Axis:   25 Text Interpretation:  Age not entered, assumed to be  57 years old for purpose of ECG interpretation Sinus rhythm Nonspecific intraventricular conduction delay Poor baseline/ artifact Reconfirmed by Izel Hochberg  MD, Zoriana Oats ([id number]) on 02/18/2013 8:57:52 AM            MDM   1. Ascites   2. Cirrhosis   3. Scrotal swelling   Hyponatremia LFT elevation  Worsening edema.  With significant swelling despite being on lasix discussed with [facility] for admission, Dr Julian Reil  Requested GI opinion prior to admitting.  GI agreed with admission. Updated pt.  Pain meds given in ED. No fevers or abd pain, no concern for SBP at this time. Pt will likely require lasix and drainage inpt.   The patients results and plan were reviewed and discussed.   Any x-rays performed were personally reviewed by myself.   Differential diagnosis were considered with the presenting HPI.  Diagnosis:above  [id number]  Admission/ observation were discussed with the admitting physician, patient and/or family and they are comfortable with the plan.    Enid Skeens, MD 02/18/13 [phone]

## 2013-02-18 NOTE — ED Notes (Signed)
Pt is unable to ambulate at this time. 

## 2013-02-18 NOTE — Consult Note (Signed)
Patient seen and examined, x-rayed were reviewed.  Patient has anasarca with massive scrotal edema, lower extremity edema, abdominal wall edema, and venous stasis changes in his lower extremities.  Ultrasound does not demonstrate much ascites.  Overall his hepatic function appears stable.  Recommend CT abdomen pelvis to rule out any obstructing masses and vascular thrombosis.  Continue Aldactone at the current dose.  I would add  vitamin K by mouth for 3 days.

## 2013-02-18 NOTE — Progress Notes (Signed)
Clinical Social Work  CSW discussed case with [facility] representative who reports SNF might be needed. CSW will await PT recommendations in order to determine what level of care is needed. CSW spoke with last SNF patient was placed at [facility] who reports that Medicaid is active and that patient has 75 Medicare days left. CSW attempted to meet with patient but patient meeting with MD. CSW will continue to follow but has submitted for 30 day pasarr in case SNF is needed.  [place], [place] [phone]

## 2013-02-18 NOTE — Consult Note (Signed)
Consultation  Referring Provider:   [facility]   Primary Care Physician:  [name], DO Primary Gastroenterologist:  Melvia Heaps       Reason for Consultation:  cirrhosis            HPI:   Tony Boyd is a 57 y.o. 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 He has a history of non-bleeding grade III esophageal varices March 2014. He has been hospitalized numerous times for hepatic encephalopathy. We saw him inpatient in late June and again in early October of this year for hepatic encephalopathy.    Patient admitted yesterday morning with scrotal swelling which started a few days ago. He has chronic lower extremity edema but feels his abdominal girth has recently increased. Patient went for ultrasound this am but there was insufficient amount to tap. Tony Boyd reports compliance with diuretics. He doesn't eat anything with salt so doesn't calculate daily grams of sodium intake. He had been feeling well with exception of scrotal edema. He had one drink at game on Sunday, otherwise no ETOH in 9-10 months.  Past Medical History  Diagnosis Date  . Cirrhosis of liver   . Hepatitis C   . ETOH abuse   . Hypothyroid   . Bipolar disorder   . Seizures   . Arthritis   . Coagulopathy     Hx of  . Thrombocytopenia     Hx of  . Pancytopenia     Hx of  . Hyponatremia     Hx of  . Korsakoff psychosis   . H/O abdominal abscess   . Metabolic encephalopathy   . Peripheral vascular disease   . GERD (gastroesophageal reflux disease)   . Obesity, unspecified   . Pneumonitis due to inhalation of food or vomitus   . Cellulitis and abscess of upper arm and forearm   . Hypopotassemia   . Anemia, unspecified   . Esophageal varices without mention of bleeding   . Esophagitis, unspecified   . Abscess of liver(572.0)   . Chronic kidney disease, unspecified   . Lumbago   . Personal history of alcoholism   .  Personal history of tobacco use, presenting hazards to health   . Pneumonitis due to inhalation of food or vomitus 09/19/2010  . Bipolar I disorder, most recent episode (or current) unspecified   . Personal history of tobacco use, presenting hazards to health   . Type 2 diabetes mellitus with diabetic neuropathy     Past Surgical History  Procedure Laterality Date  . Colostomy    . Cholecystectomy    . Small intestine surgery    . Arm surgery      left arm  . US guided drain placement in ventral hernia abscess  07/21/2010  . Multiple picc line placements    . Esophagogastroduodenoscopy  06/28/2011    Procedure: ESOPHAGOGASTRODUODENOSCOPY (EGD);  Surgeon: Louis Meckel, MD;  Location: [facility];  Service: Endoscopy;  Laterality: N/A;  . Esophagogastroduodenoscopy N/A 05/06/2012    Procedure: ESOPHAGOGASTRODUODENOSCOPY (EGD);  Surgeon: Louis Meckel, MD;  Location: [facility];  Service: Endoscopy;  Laterality: N/A;  . Esophageal banding N/A 05/06/2012    Procedure: ESOPHAGEAL BANDING;  Surgeon: Louis Meckel, MD;  Location: [facility];  Service: Endoscopy;  Laterality: N/A;    Family History  Problem Relation Age of Onset  . Heart disease Mother     MI  .  Lung cancer Brother      History  Substance Use Topics  . Smoking status: Former Smoker    Types: Cigarettes    Quit date: 02/15/2008  . Smokeless tobacco: Never Used  . Alcohol Use: No     Comment: Quit drinking 1 year ago.      Prior to Admission medications   Medication Sig Start Date End Date Taking? Authorizing Provider  albuterol (PROVENTIL) (5 MG/ML) 0.5% nebulizer solution Take 0.5 mLs (2.5 mg total) by nebulization every 4 (four) hours as needed for wheezing or shortness of breath. 12/11/12  Yes Drema Dallas, MD  ALPRAZolam Prudy Feeler) 1 MG tablet Take 1 mg by mouth 2 (two) times daily.    Yes Historical Provider, MD  alum & mag hydroxide-simeth (MAALOX/MYLANTA) 200-200-20 MG/5ML suspension Take 30 mLs by  mouth every 6 (six) hours as needed. 12/11/12  Yes Drema Dallas, MD  ferrous sulfate 325 (65 FE) MG tablet Take 325 mg by mouth at bedtime.   Yes Historical Provider, MD  folic acid (FOLVITE) 1 MG tablet Take 1 mg by mouth every morning.    Yes Historical Provider, MD  furosemide (LASIX) 40 MG tablet TAKE 1 & 1/2 TABLET BY MOUTH TWICE DAILY 01/25/13  Yes Claudie Revering, NP  insulin glargine (LANTUS) 100 UNIT/ML injection Inject 0.12 mLs (12 Units total) into the skin at bedtime. 02/11/13  Yes Claudie Revering, NP  lactulose (CHRONULAC) 10 GM/15ML solution Take 45 mLs (30 g total) by mouth 5 (five) times daily. 01/08/13  Yes Claudie Revering, NP  levETIRAcetam (KEPPRA) 500 MG tablet Take 1 tablet (500 mg total) by mouth 2 (two) times daily. 02/11/13  Yes Claudie Revering, NP  levothyroxine (SYNTHROID, LEVOTHROID) 75 MCG tablet Take 75 mcg by mouth every morning.    Yes Historical Provider, MD  nadolol (CORGARD) 40 MG tablet Take 1 tablet (40 mg total) by mouth every morning. 02/11/13  Yes Claudie Revering, NP  omeprazole (PRILOSEC) 20 MG capsule Take 20 mg by mouth every morning.    Yes Historical Provider, MD  ondansetron (ZOFRAN) 4 MG tablet Take 1 tablet (4 mg total) by mouth every 6 (six) hours as needed for nausea. 01/08/13  Yes Claudie Revering, NP  PARoxetine (PAXIL) 20 MG tablet TAKE 1 TABLET BY MOUTH EVERY DAY 02/09/13  Yes Mahima Pandey, MD  potassium chloride SA (K-DUR,KLOR-CON) 20 MEQ tablet Take 1 tablet (20 mEq total) by mouth every morning. 01/15/13  Yes Tiffany L Reed, DO  rifaximin (XIFAXAN) 550 MG TABS tablet Take 1 tablet (550 mg total) by mouth 2 (two) times daily. 01/21/13  Yes Tiffany L Reed, DO  risperiDONE (RISPERDAL) 0.25 MG tablet Take 0.25 mg by mouth 2 (two) times daily.   Yes Historical Provider, MD  spironolactone (ALDACTONE) 100 MG tablet Take 1 tablet (100 mg total) by mouth every morning. 01/15/13  Yes Tiffany L Reed, DO  Oxycodone HCl 10 MG TABS Take 1 tablet (10 mg  total) by mouth every 4 (four) hours. 02/18/13   Oneal Grout, MD    Current Facility-Administered Medications  Medication Dose Route Frequency Provider Last Rate Last Dose  . albumin human 25 % solution 12.5 g  12.5 g Intravenous Q6H Hollice Espy, MD   12.5 g at 02/18/13 1058  . albuterol (PROVENTIL) (5 MG/ML) 0.5% nebulizer solution 2.5 mg  2.5 mg Nebulization Q4H PRN Hillary Bow, DO      . ALPRAZolam Prudy Feeler) tablet 1 mg  1 mg Oral BID Hillary Bow, DO   1 mg at 02/18/13 1052  . alum & mag hydroxide-simeth (MAALOX/MYLANTA) 200-200-20 MG/5ML suspension 30 mL  30 mL Oral Q6H PRN Hillary Bow, DO      . ferrous sulfate tablet 325 mg  325 mg Oral QHS Jared M Gardner, DO      . folic acid (FOLVITE) tablet 1 mg  1 mg Oral q morning - 10a Hillary Bow, DO   1 mg at 02/18/13 1053  . furosemide (LASIX) injection 40 mg  40 mg Intravenous Q12H Hollice Espy, MD   40 mg at 02/18/13 1104  . insulin glargine (LANTUS) injection 12 Units  12 Units Subcutaneous QHS Hillary Bow, DO      . lactulose (CHRONULAC) 10 GM/15ML solution 30 g  30 g Oral 5 X Daily Hillary Bow, DO   30 g at 02/18/13 1053  . levETIRAcetam (KEPPRA) tablet 500 mg  500 mg Oral BID Hillary Bow, DO   500 mg at 02/18/13 1055  . levothyroxine (SYNTHROID, LEVOTHROID) tablet 75 mcg  75 mcg Oral q morning - 10a Hillary Bow, DO   75 mcg at 02/18/13 1055  . nadolol (CORGARD) tablet 40 mg  40 mg Oral q morning - 10a Hillary Bow, DO   40 mg at 02/18/13 1054  . ondansetron (ZOFRAN) tablet 4 mg  4 mg Oral Q6H PRN Hillary Bow, DO      . oxyCODONE (Oxy IR/ROXICODONE) immediate release tablet 10 mg  10 mg Oral Q4H Hillary Bow, DO   10 mg at 02/18/13 1054  . pantoprazole (PROTONIX) EC tablet 40 mg  40 mg Oral Daily Hillary Bow, DO   40 mg at 02/18/13 1054  . PARoxetine (PAXIL) tablet 20 mg  20 mg Oral Daily Hillary Bow, DO   20 mg at 02/18/13 1054  . potassium chloride SA (K-DUR,KLOR-CON) CR tablet  20 mEq  20 mEq Oral q morning - 10a Hillary Bow, DO   20 mEq at 02/18/13 1053  . rifaximin (XIFAXAN) tablet 550 mg  550 mg Oral BID Hillary Bow, DO   550 mg at 02/18/13 1053  . risperiDONE (RISPERDAL) tablet 0.25 mg  0.25 mg Oral BID Hillary Bow, DO   0.25 mg at 02/18/13 1053  . spironolactone (ALDACTONE) tablet 100 mg  100 mg Oral q morning - 10a Hillary Bow, DO   100 mg at 02/18/13 1053    Allergies as of 02/17/2013 - Review Complete 02/17/2013  Allergen Reaction Noted  . Ativan [lorazepam] Other (See Comments) 08/08/2012  . Droperidol Other (See Comments) 09/14/2010  . Penicillins Swelling 09/14/2010  . Toradol [ketorolac tromethamine] Hives 06/06/2012    Review of Systems:    Positive for mild shortness of breath over last few days. All other systems reviewed and negative except where noted in HPI.    Physical Exam:  Vital signs in last 24 hours: Temp:  [98.3 F (36.8 C)-98.6 F (37 C)] 98.3 F (36.8 C) (12/24 0607) Pulse Rate:  [75-81] 76 (12/24 0607) Resp:  [12-17] 16 (12/24 0607) BP: (119-124)/(45-71) 121/45 mmHg (12/24 0600) SpO2:  [96 %-98 %] 98 % (12/24 0607) Last BM Date: 02/18/13 General:   Pleasant white male in NAD Head:  Normocephalic and atraumatic. Eyes:   No icterus.   Conjunctiva pink. Ears:  Normal auditory acuity. Neck:  Supple; no masses felt Lungs:  Respirations even and  unlabored. Difficult to adequately assess as patient has difficulty mobilizes himself. There are decreased breath sounds in RLL.  No wheezes, crackles, or rhonchi.  Heart:  Regular rate and rhythm;  murmur heard. Abdomen:  Soft, largely distended, nontender. Massive abdominal wall edema. Normal bowel sounds. Cannot assess for masses given body habitus  Rectal:  Not performed.  Msk:  Symmetrical without gross deformities.  Extremities:  Massive BLE edema.  Neurologic:  Alert and  oriented x4;  grossly normal neurologically.No asterixis Skin:  BLE stasis changes with brown  discoloration of skin. Cervical Nodes:  No significant cervical adenopathy. Psych:  Alert and cooperative. Normal affect.  LAB RESULTS:  Recent Labs  02/18/13 0234  WBC 3.8*  HGB 9.3*  HCT 27.6*  PLT 48*   BMET  Recent Labs  02/18/13 0234  NA 132*  K 3.7  CL 98  CO2 29  GLUCOSE 147*  BUN 12  CREATININE 0.80  CALCIUM 8.1*   LFT  Recent Labs  02/18/13 0234  PROT 6.0  ALBUMIN 1.9*  AST 46*  ALT 21  ALKPHOS 155*  BILITOT 2.3*   PT/INR  Recent Labs  02/18/13 0234  LABPROT 21.1*  INR 1.89*    STUDIES: Dg Chest 2 View  02/18/2013   CLINICAL DATA:  Shortness of breath and groin swelling.  EXAM: CHEST  2 VIEW  COMPARISON:  Chest radiograph performed 01/11/2013  FINDINGS: The lungs are relatively well-aerated. Mild vascular congestion is noted, without significant pulmonary edema. Mild bilateral atelectasis is seen. There is no evidence of pleural effusion or pneumothorax.  The heart is borderline enlarged. No acute osseous abnormalities are seen.  IMPRESSION: Mild vascular congestion and borderline cardiomegaly; mild bilateral atelectasis seen.   Electronically Signed   By: Roanna Raider M.D.   On: 02/18/2013 00:52   US Abdomen Limited  02/18/2013   CLINICAL DATA:  Ascites  EXAM: LIMITED ABDOMEN ULTRASOUND FOR ASCITES  TECHNIQUE: Limited ultrasound survey for ascites was performed in all four abdominal quadrants.  COMPARISON:  12/04/2012  FINDINGS: Ultrasound was performed to assess for ascites for possible paracentesis. There is a very small amount of free fluid noted in the right lower quadrant, not sufficient to tap at this time.  IMPRESSION: Small amount of right lower quadrant ascites, not sufficient to tap.   Electronically Signed   By: Charlett Nose M.D.   On: 02/18/2013 09:23   PREVIOUS ENDOSCOPIES:            EGD May 2013  ENDOSCOPIC IMPRESSION:  1) Grade III varices in the distal esophagus  2) Portal gastropathy  3) Otherwise normal examination    RECOMMENDATIONS:Begin nadolol 40mg  qd  OV 1 month  EGD 1 year  EGD March 2014. ENDOSCOPIC IMPRESSION:  1. Grade 3 varices  2. portal hypertensive gastropathy RECOMMENDATIONS:  Continue nadolol 40 mg daily  REPEAT EXAM:  Endoscopy one year    Impression / Plan:    57 year old male with HCV / ETOH cirrhosis admitted with scrotal edema. Patient is massively volume overloaded with peripheral edema and abdominal wall edema. He reports compliance with sodium and diuretics. Interestingly he has minimal ascites. Coagulopathy is chronic. He appears stable from standpoint of cirrhosis.   AFP was elevated several months back, will repeat today.   Continue Lactulose, can decrease from 5x daily to 3x daily if taking Xifaxan. Continue IV lasix 40mg  BID. Can double aldactone from 100mg  daily to 200mg  daily.   Monitor electrolytes and renal function.  Daily weights.   Recommend CTscan of abd/pelvis to rule out any obstructing masses / vascular thrombosis as cause for massive swelling.   Thanks   LOS: 1 day   Willette Cluster  02/18/2013, 12:12 PM  GI Attending Note   Chart was reviewed and patient was examined. X-rays and lab were reviewed.    I agree with management and plans.  Barbette Hair. Arlyce Dice, M.D., [facility] Cell [phone]

## 2013-02-18 NOTE — H&P (Signed)
[facility] History and Physical  [name] NGE:[id number] DOB: [birth year] DOA: 02/17/2013  Referring physician: EDP PCP: [name], DO   Chief Complaint: Ascites   HPI: Tony Boyd is a 57 y.o. male who is well known to our service.  He has cirrhosis of the liver, end stage liver disease, due to a past history of EtOH abuse and HCV.  He is frequently admitted to our service with severe hepatic encephalopathy (8 times in the past 6 months alone).  Although he has had some ascites in the past he has never had severe enough ascites to require paracentesis until now.  Patient presents to the ED with abdominal and scrotal swelling which has been worsening over the past few days.  Normally he just has swelling in his legs.  The swelling is so severe at this point that he is unable to ambulate and is short of breath due to abdominal girth.  Review of Systems: Systems reviewed.  As above, otherwise negative  Past Medical History  Diagnosis Date  . Cirrhosis of liver   . Hepatitis C   . ETOH abuse   . Hypothyroid   . Psychosis   . Bipolar disorder   . Seizures   . Arthritis   . Back pain   . Coagulopathy     Hx of  . Thrombocytopenia     Hx of  . Pancytopenia     Hx of  . H/O hypokalemia   . Hyponatremia     Hx of  . Korsakoff psychosis   . H/O abdominal abscess   . H/O esophageal varices   . Metabolic encephalopathy   . Hepatic encephalopathy   . Urinary urgency   . H/O renal failure   . H/O ascites   . Altered mental status   . Anemia   . Ascites   . Shortness of breath   . Peripheral vascular disease   . GERD (gastroesophageal reflux disease)   . Thrombocytopenia, acquired 06/02/2012  . Unspecified hypothyroidism 06/02/2012  . Pain in joint, pelvic region and thigh   . Edema   . Obesity, unspecified   . Chest pain, unspecified   . Pneumonitis due to inhalation of food or vomitus   . Other convulsions   . Cellulitis and abscess of upper arm and forearm    . Chronic hepatitis C without mention of hepatic coma   . Hypopotassemia   . Anemia, unspecified   . Other and unspecified coagulation defects   . Thrombocytopenia, unspecified   . Bipolar I disorder, most recent episode (or current) unspecified   . Unspecified psychosis   . Encephalopathy   . Esophageal varices without mention of bleeding   . Esophagitis, unspecified   . Abscess of liver(572.0)   . Chronic kidney disease, unspecified   . Lumbago   . Personal history of alcoholism   . Personal history of tobacco use, presenting hazards to health   . Cirrhosis of liver without mention of alcohol   . Pneumonitis due to inhalation of food or vomitus 09/19/2010  . Chronic hepatitis C without mention of hepatic coma   . Bipolar I disorder, most recent episode (or current) unspecified   . Unspecified psychosis   . Other ascites   . Personal history of tobacco use, presenting hazards to health   . Cirrhosis of liver without mention of alcohol   . Type 2 diabetes mellitus with diabetic neuropathy    Past Surgical History  Procedure Laterality Date  .  Colostomy    . Cholecystectomy    . Small intestine surgery    . Arm surgery      left arm  . US guided drain placement in ventral hernia abscess  07/21/2010  . Multiple picc line placements    . Esophagogastroduodenoscopy  06/28/2011    Procedure: ESOPHAGOGASTRODUODENOSCOPY (EGD);  Surgeon: Louis Meckel, MD;  Location: [facility];  Service: Endoscopy;  Laterality: N/A;  . Esophagogastroduodenoscopy N/A 05/06/2012    Procedure: ESOPHAGOGASTRODUODENOSCOPY (EGD);  Surgeon: Louis Meckel, MD;  Location: [facility];  Service: Endoscopy;  Laterality: N/A;  . Esophageal banding N/A 05/06/2012    Procedure: ESOPHAGEAL BANDING;  Surgeon: Louis Meckel, MD;  Location: [facility];  Service: Endoscopy;  Laterality: N/A;   Social History:  reports that he quit smoking about 5 years ago. His smoking use included Cigarettes. He smoked 0.00  packs per day. He has never used smokeless tobacco. He reports that he does not drink alcohol or use illicit drugs.  Allergies  Allergen Reactions  . Ativan [Lorazepam] Other (See Comments)    "hallucinations"  . Droperidol Other (See Comments)    unknown  . Penicillins Swelling  . Toradol [Ketorolac Tromethamine] Hives    Family History  Problem Relation Age of Onset  . Heart disease Mother     MI  . Lung cancer Brother      Prior to Admission medications   Medication Sig Start Date End Date Taking? Authorizing Provider  albuterol (PROVENTIL) (5 MG/ML) 0.5% nebulizer solution Take 0.5 mLs (2.5 mg total) by nebulization every 4 (four) hours as needed for wheezing or shortness of breath. 12/11/12  Yes Drema Dallas, MD  ALPRAZolam Prudy Feeler) 1 MG tablet Take 1 mg by mouth 2 (two) times daily.    Yes Historical Provider, MD  alum & mag hydroxide-simeth (MAALOX/MYLANTA) 200-200-20 MG/5ML suspension Take 30 mLs by mouth every 6 (six) hours as needed. 12/11/12  Yes Drema Dallas, MD  ferrous sulfate 325 (65 FE) MG tablet Take 325 mg by mouth at bedtime.   Yes Historical Provider, MD  folic acid (FOLVITE) 1 MG tablet Take 1 mg by mouth every morning.    Yes Historical Provider, MD  furosemide (LASIX) 40 MG tablet TAKE 1 & 1/2 TABLET BY MOUTH TWICE DAILY 01/25/13  Yes Claudie Revering, NP  insulin glargine (LANTUS) 100 UNIT/ML injection Inject 0.12 mLs (12 Units total) into the skin at bedtime. 02/11/13  Yes Claudie Revering, NP  lactulose (CHRONULAC) 10 GM/15ML solution Take 45 mLs (30 g total) by mouth 5 (five) times daily. 01/08/13  Yes Claudie Revering, NP  levETIRAcetam (KEPPRA) 500 MG tablet Take 1 tablet (500 mg total) by mouth 2 (two) times daily. 02/11/13  Yes Claudie Revering, NP  levothyroxine (SYNTHROID, LEVOTHROID) 75 MCG tablet Take 75 mcg by mouth every morning.    Yes Historical Provider, MD  nadolol (CORGARD) 40 MG tablet Take 1 tablet (40 mg total) by mouth every morning.  02/11/13  Yes Claudie Revering, NP  omeprazole (PRILOSEC) 20 MG capsule Take 20 mg by mouth every morning.    Yes Historical Provider, MD  ondansetron (ZOFRAN) 4 MG tablet Take 1 tablet (4 mg total) by mouth every 6 (six) hours as needed for nausea. 01/08/13  Yes Claudie Revering, NP  Oxycodone HCl 10 MG TABS Take 1 tablet (10 mg total) by mouth every 4 (four) hours. 01/21/13  Yes Mahima Glade Lloyd, MD  PARoxetine (PAXIL) 20 MG  tablet TAKE 1 TABLET BY MOUTH EVERY DAY 02/09/13  Yes Mahima Pandey, MD  potassium chloride SA (K-DUR,KLOR-CON) 20 MEQ tablet Take 1 tablet (20 mEq total) by mouth every morning. 01/15/13  Yes Tiffany L Reed, DO  rifaximin (XIFAXAN) 550 MG TABS tablet Take 1 tablet (550 mg total) by mouth 2 (two) times daily. 01/21/13  Yes Tiffany L Reed, DO  risperiDONE (RISPERDAL) 0.25 MG tablet Take 0.25 mg by mouth 2 (two) times daily.   Yes Historical Provider, MD  spironolactone (ALDACTONE) 100 MG tablet Take 1 tablet (100 mg total) by mouth every morning. 01/15/13  Yes Kermit Balo, DO   Physical Exam: Filed Vitals:   02/18/13 0311  BP: 124/71  Pulse: 76  Temp:   Resp: 15    BP 124/71  Pulse 76  Temp(Src) 98.6 F (37 C) (Oral)  Resp 15  SpO2 96%  General Appearance:    Alert, oriented, no distress, appears stated age  Head:    Normocephalic, atraumatic  Eyes:    PERRL, EOMI, sclera non-icteric        Nose:   Nares without drainage or epistaxis. Mucosa, turbinates normal  Throat:   Moist mucous membranes. Oropharynx without erythema or exudate.  Neck:   Supple. No carotid bruits.  No thyromegaly.  No lymphadenopathy.   Back:     No CVA tenderness, no spinal tenderness  Lungs:     Clear to auscultation bilaterally, without wheezes, rhonchi or rales  Chest wall:    No tenderness to palpitation  Heart:    Regular rate and rhythm without murmurs, gallops, rubs  Abdomen:     Soft, non-tender, distended with ascites, normal bowel sounds, no organomegaly  Genitalia:     deferred  Rectal:    deferred  Extremities:   No clubbing, cyanosis or edema.  Pulses:   2+ and symmetric all extremities  Skin:   Skin color, texture, turgor normal, no rashes or lesions  Lymph nodes:   Cervical, supraclavicular, and axillary nodes normal  Neurologic:   CNII-XII intact. Normal strength, sensation and reflexes      throughout    Labs on Admission:  Basic Metabolic Panel:  Recent Labs Lab 02/11/13 1100 02/18/13 0234  NA 132* 132*  K 3.8 3.7  CL 97 98  CO2 24 29  GLUCOSE 129* 147*  BUN 8 12  CREATININE 0.63* 0.80  CALCIUM 7.8* 8.1*   Liver Function Tests:  Recent Labs Lab 02/11/13 1100 02/18/13 0234  AST 46* 46*  ALT 16 21  ALKPHOS 152* 155*  BILITOT 2.4* 2.3*  PROT 5.8* 6.0  ALBUMIN  --  1.9*   No results found for this basename: LIPASE, AMYLASE,  in the last 168 hours  Recent Labs Lab 02/11/13 1100  AMMONIA 181*   CBC:  Recent Labs Lab 02/18/13 0234  WBC 3.8*  NEUTROABS 2.1  HGB 9.3*  HCT 27.6*  MCV 103.0*  PLT 48*   Cardiac Enzymes: No results found for this basename: CKTOTAL, CKMB, CKMBINDEX, TROPONINI,  in the last 168 hours  BNP (last 3 results)  Recent Labs  12/03/12 1435 01/11/13 2055  PROBNP 297.7* 207.4*   CBG: No results found for this basename: GLUCAP,  in the last 168 hours  Radiological Exams on Admission: Dg Chest 2 View  02/18/2013   CLINICAL DATA:  Shortness of breath and groin swelling.  EXAM: CHEST  2 VIEW  COMPARISON:  Chest radiograph performed 01/11/2013  FINDINGS: The lungs are relatively well-aerated.  Mild vascular congestion is noted, without significant pulmonary edema. Mild bilateral atelectasis is seen. There is no evidence of pleural effusion or pneumothorax.  The heart is borderline enlarged. No acute osseous abnormalities are seen.  IMPRESSION: Mild vascular congestion and borderline cardiomegaly; mild bilateral atelectasis seen.   Electronically Signed   By: Roanna Raider M.D.   On: 02/18/2013  00:52    EKG: Independently reviewed.  Assessment/Plan Principal Problem:   Ascites Active Problems:   Cirrhosis   1. Cirrhosis with Ascites - cirrhosis is very chronic and well documented in this patient, the severity of the ascites however is somewhat new, he is usually admitted for hepatic encephalopathy not ascites.  Admitting patient for IR drainage of ascites, continuing all home meds, NPO until surgery but then may resume hepatic diet. 2. DM - Q4H BGL checks, takes glargine at night, will continue this since I expect him to no longer be NPO by bedtime today. 3. Cirrhosis - long standing, due to EtOH and HCV.  GI will see patient in consult in the AM.  Code Status: Limited code (no CPR) Family Communication: No family in room Disposition Plan: Admit to obs  Time spent: 70 min  Jiah Bari M. [facility] Pager [phone]  If 7AM-7PM, please contact the day team taking care of the patient [email or web address] Password [facility] 02/18/2013, 4:48 AM

## 2013-02-18 NOTE — ED Notes (Signed)
Lab informed to come and draw pt's blood.

## 2013-02-18 NOTE — ED Notes (Signed)
1 unsuccessful IV attempt.

## 2013-02-18 NOTE — ED Notes (Signed)
Dr. Gardner at bedside 

## 2013-02-18 NOTE — Progress Notes (Signed)
Patient is currently active with [facility] Care Management for chronic disease management services.  Patient has been engaged by a [employer].  In to visit at bedside patient was pleasant and A&O x 4.  Made him aware of the [facility] liaison role and provided contact information.  Our community based plan of care has focused on disease management of acute on chronic disease processes, medication management, and community resource support.  [facility] was [facility] but may now not qualify if he can not live independently at home.  He is currently in need of a higher level of care.  Made inpatient RNCM and LCSW aware of request for SNF placement.  LCSW will assist to determine if he has exhausted his SNF benefit.  Will continue to monitor. Of note, [facility] Care Management services does not replace or interfere with any services that are arranged by inpatient case management or social work.  For additional questions or referrals please contact Anibal Henderson BSN RN [facility] Liaison at [phone].

## 2013-02-18 NOTE — Progress Notes (Signed)
UR completed. Patient changed to inpatient r/t requiring IV lasix scheduled.  

## 2013-02-19 DIAGNOSIS — G40909 Epilepsy, unspecified, not intractable, without status epilepticus: Secondary | ICD-10-CM

## 2013-02-19 DIAGNOSIS — E440 Moderate protein-calorie malnutrition: Secondary | ICD-10-CM

## 2013-02-19 LAB — GLUCOSE, CAPILLARY
Glucose-Capillary: 149 mg/dL — ABNORMAL HIGH (ref 70–99)
Glucose-Capillary: 159 mg/dL — ABNORMAL HIGH (ref 70–99)

## 2013-02-19 LAB — COMPREHENSIVE METABOLIC PANEL
ALT: 22 U/L (ref 0–53)
AST: 51 U/L — ABNORMAL HIGH (ref 0–37)
Albumin: 2.2 g/dL — ABNORMAL LOW (ref 3.5–5.2)
Alkaline Phosphatase: 158 U/L — ABNORMAL HIGH (ref 39–117)
CO2: 29 mEq/L (ref 19–32)
Calcium: 8.1 mg/dL — ABNORMAL LOW (ref 8.4–10.5)
Creatinine, Ser: 0.78 mg/dL (ref 0.50–1.35)
GFR calc Af Amer: >90 mL/min (ref >90)
GFR calc non Af Amer: >90 mL/min (ref >90)
Glucose, Bld: 176 mg/dL — ABNORMAL HIGH (ref 70–99)
Potassium: 3.7 mEq/L (ref 3.5–5.1)
Total Protein: 6.1 g/dL (ref 6.0–8.3)

## 2013-02-19 LAB — AFP TUMOR MARKER: AFP-Tumor Marker: 242.6 ng/mL — ABNORMAL HIGH (ref 0.0–8.0)

## 2013-02-19 MED ORDER — SPIRONOLACTONE 100 MG PO TABS
100.0000 mg | ORAL_TABLET | Freq: Two times a day (BID) | ORAL | Status: DC
  Administered 2013-02-19 – 2013-02-25 (×13): 100 mg via ORAL
  Filled 2013-02-19 (×15): qty 1

## 2013-02-19 MED ORDER — PHYTONADIONE 5 MG PO TABS
2.5000 mg | ORAL_TABLET | Freq: Every day | ORAL | Status: DC
  Administered 2013-02-19 – 2013-02-21 (×3): 2.5 mg via ORAL
  Administered 2013-02-22: 11:00:00 via ORAL
  Administered 2013-02-23 – 2013-02-25 (×3): 2.5 mg via ORAL
  Filled 2013-02-19 (×9): qty 1

## 2013-02-19 MED ORDER — ALBUMIN HUMAN 25 % IV SOLN
12.5000 g | Freq: Four times a day (QID) | INTRAVENOUS | Status: AC
  Administered 2013-02-19 – 2013-02-21 (×7): 12.5 g via INTRAVENOUS
  Filled 2013-02-19 (×11): qty 50

## 2013-02-19 NOTE — Progress Notes (Signed)
Progress Note   Subjective  *Resting comfortably.  No new complaints.**   Objective  Vital signs in last 24 hours: Temp:  [97.7 F (36.5 C)-98.7 F (37.1 C)] 97.7 F (36.5 C) (12/25 0600) Pulse Rate:  [70-74] 70 (12/25 0600) Resp:  [16-18] 18 (12/25 0600) BP: (118-129)/(61-70) 126/61 mmHg (12/25 0600) SpO2:  [93 %-96 %] 93 % (12/25 0600) Last BM Date: 02/19/13 General:   Alert,  Well-developed,  white male in NAD Heart:  Regular rate and rhythm; no murmurs Abdomen:  Soft, nontender and Normal bowel sounds, without guarding, and without rebound.  Distended, abdominal wall edema Extremities:  Severe showing edema with venous stasis changes. Neurologic:  Alert and  oriented x4;  grossly normal neurologically. Psych:  Alert and cooperative. Normal mood and affect.  Intake/Output from previous day: 12/24 0701 - 12/25 0700 In: 480 [P.O.:480] Out: 650 [Urine:650] Intake/Output this shift:    Lab Results:  Recent Labs  02/18/13 0234  WBC 3.8*  HGB 9.3*  HCT 27.6*  PLT 48*   BMET  Recent Labs  02/18/13 0234 02/19/13 0525  NA 132* 133*  K 3.7 3.7  CL 98 97  CO2 29 29  GLUCOSE 147* 176*  BUN 12 11  CREATININE 0.80 0.78  CALCIUM 8.1* 8.1*   LFT  Recent Labs  02/19/13 0525  PROT 6.1  ALBUMIN 2.2*  AST 51*  ALT 22  ALKPHOS 158*  BILITOT 2.3*   PT/INR  Recent Labs  02/18/13 0234  LABPROT 21.1*  INR 1.89*   Hepatitis Panel No results found for this basename: HEPBSAG, HCVAB, HEPAIGM, HEPBIGM,  in the last 72 hours  Studies/Results: Dg Chest 2 View  02/18/2013   CLINICAL DATA:  Shortness of breath and groin swelling.  EXAM: CHEST  2 VIEW  COMPARISON:  Chest radiograph performed 01/11/2013  FINDINGS: The lungs are relatively well-aerated. Mild vascular congestion is noted, without significant pulmonary edema. Mild bilateral atelectasis is seen. There is no evidence of pleural effusion or pneumothorax.  The heart is borderline enlarged. No acute  osseous abnormalities are seen.  IMPRESSION: Mild vascular congestion and borderline cardiomegaly; mild bilateral atelectasis seen.   Electronically Signed   By: Roanna Raider M.D.   On: 02/18/2013 00:52   Ct Abdomen Pelvis W Contrast  02/18/2013   CLINICAL DATA:  Abdominal pain.  Massive anasarca.  EXAM: CT ABDOMEN AND PELVIS WITH CONTRAST  TECHNIQUE: Multidetector CT imaging of the abdomen and pelvis was performed using the standard protocol following bolus administration of intravenous contrast.  CONTRAST:  OMNIPAQUE IOHEXOL 300 MG/ML  SOLN  COMPARISON:  CT 07/18/2010.  FINDINGS: Lung Bases: Dependent atelectasis.  Liver: Severe hepatic cirrhosis. Liver is small. Tiny area of low attenuation in the medial right hepatic lobe probably represents an area of scarring and is unchanged compared to 5/20 03/2010. There is no focal discrete mass lesion identified.  Spleen:  Splenomegaly.  Gallbladder:  Surgically absent.  Common bile duct:  Grossly normal.  Pancreas:  Normal.  Adrenal glands:  Normal.  Kidneys: Unchanged left upper pole renal cyst. Renal enhancement is within normal limits. Tiny right upper pole cystic lesion likely represents a simple cyst. The ureters grossly appear within normal limits.  Stomach: Gastric varices are present. Stomach is distended with food.  Small bowel: No small bowel inflammation. Edematous mesenteric is present, probably secondary to hepatic cirrhosis and hypoproteinemia.  Colon: Colonic diverticulosis. Oral contrast is present within the colon. No inflammatory changes. Normal appendix.  Pelvic Genitourinary: Foley catheter. Collapsed urinary bladder. Minimal free fluid in the anatomic pelvis.  Bones: Ankylosis of the SI joints. L3 compression fractures present which is new compared to the prior CT of 07/18/2010. Mild retropulsion. No aggressive osseous lesions. Loss of vertebral body height is about 50%.  Vasculature: Portal venous hypertension is present. Numerous  collaterals are present in the upper abdomen. There is no recanalization of the periumbilical vein however there are all Large amount portal venous collaterals that extends through the anterior abdominal wall into the subcutaneous fat including around the umbilicus. No significant retroperitoneal collaterals are identified. Atherosclerosis without an acute vascular abnormality.  Body Wall: Anasarca. Varices are present in the subcutaneous fat associated with portal venous hypertension.  IMPRESSION: 1. Severe hepatic cirrhosis. 2. Portal venous hypertension with varices extending from the abdomen into the abdominal subcutaneous fat. 3. Anasarca and mild ascites. 4. Splenomegaly. 5. Renal cysts. 6. New L3 compression fracture with 50% loss of vertebral body height. No paravertebral hematoma. Although new compared to 2012, this has features suggesting chronicity.   Electronically Signed   By: Andreas Newport M.D.   On: 02/18/2013 23:02   US Abdomen Limited  02/18/2013   CLINICAL DATA:  Ascites  EXAM: LIMITED ABDOMEN ULTRASOUND FOR ASCITES  TECHNIQUE: Limited ultrasound survey for ascites was performed in all four abdominal quadrants.  COMPARISON:  12/04/2012  FINDINGS: Ultrasound was performed to assess for ascites for possible paracentesis. There is a very small amount of free fluid noted in the right lower quadrant, not sufficient to tap at this time.  IMPRESSION: Small amount of right lower quadrant ascites, not sufficient to tap.   Electronically Signed   By: Charlett Nose M.D.   On: 02/18/2013 09:23      Assessment & Plan  1.  anasarca.  No evidence for venous obstruction or masses by CT 2.  diffuse varices**  Recommendations 1.  increase Aldactone to 100 mg twice a day; continue nadolol and Lasix, salt poor albumin 2.  nutrition consult for careful dietary management  Principal Problem:   Ascites Active Problems:   Cirrhosis     LOS: 2 days   Melvia Heaps  02/19/2013, 9:17 AM

## 2013-02-19 NOTE — Progress Notes (Addendum)
[facility] PROGRESS NOTE  Tony Boyd ZOX:[id number] DOB: August 25, 1955 DOA: 02/17/2013 PCP: [name], DO  Assessment/Plan: Principal Problem:   Ascites/anasarca: Continue Lasix plus Spironolactone plus IV albumin. CT scan confirms portal hypertension, but no signs of any obstructive mass.  Active Problems:   Hepatic encephalopathy: Currently stable. 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 Stable.  Coagulopathy:Will start vitamin K daily    Hypothyroidism: Continue Synthroid    Diabetes mellitus type 2, uncontrolled, with complications: On Lantus plus sliding scale, CBG is below 200    Moderate protein-calorie malnutrition: On IV albumin, nutrition consult   Code Status: Partial code  Family Communication: Left message with daughter  Disposition Plan: Continue diuresis as much as possible   Consultants:  [place] gastro[facility]ology  Procedures:  None  Antibiotics:  None  HPI/Subjective: Patient currently sleeping  Objective: Filed Vitals:   02/19/13 1519  BP: 113/70  Pulse: 68  Temp: 98.3 F (36.8 C)  Resp: 20    Intake/Output Summary (Last 24 hours) at 02/19/13 1700 Last data filed at 02/19/13 1520  Gross per 24 hour  Intake    480 ml  Output   2200 ml  Net  -1720 ml   There were no vitals filed for this visit.  Exam:   General:  Currently sleeping  Cardiovascular: Regular rate and rhythm, S1-S2  Respiratory: Clear to auscultation bilaterally, limited by body habitus  Abdomen: Soft, obese, nontender, positive bowel sounds  Musculoskeletal: No clubbing or cyanosis, 3+ pitting edema from the knees down as well scrotal edema   Data Reviewed: Basic Metabolic Panel:  Recent  Labs Lab 02/18/13 0234 02/19/13 0525  NA 132* 133*  K 3.7 3.7  CL 98 97  CO2 29 29  GLUCOSE 147* 176*  BUN 12 11  CREATININE 0.80 0.78  CALCIUM 8.1* 8.1*   Liver Function Tests:  Recent Labs Lab 02/18/13 0234 02/19/13 0525  AST 46* 51*  ALT 21 22  ALKPHOS 155* 158*  BILITOT 2.3* 2.3*  PROT 6.0 6.1  ALBUMIN 1.9* 2.2*   No results found for this basename: LIPASE, AMYLASE,  in the last 168 hours  Recent Labs Lab 02/19/13 0525  AMMONIA 128*   CBC:  Recent Labs Lab 02/18/13 0234  WBC 3.8*  NEUTROABS 2.1  HGB 9.3*  HCT 27.6*  MCV 103.0*  PLT 48*   Cardiac Enzymes: No results found for this basename: CKTOTAL, CKMB, CKMBINDEX, TROPONINI,  in the last 168 hours BNP (last 3 results)  Recent Labs  12/03/12 1435 01/11/13 2055 02/18/13 0234  PROBNP 297.7* 207.4* 985.8*   CBG:  Recent Labs Lab 02/18/13 2214 02/19/13 0043 02/19/13 0527 02/19/13 0751 02/19/13 1200  GLUCAP 151* 165* 163* 149* 173*    No results found for this or any previous visit (from the past 240 hour(s)).   Studies: Dg Chest 2 View  02/18/2013   CLINICAL DATA:  Shortness of breath and groin swelling.  EXAM: CHEST  2 VIEW  COMPARISON:  Chest radiograph performed 01/11/2013  FINDINGS: The lungs are relatively well-aerated. Mild vascular congestion is noted, without significant pulmonary edema. Mild bilateral atelectasis is seen. There is no evidence of pleural effusion or pneumothorax.  The heart is borderline enlarged. No acute osseous abnormalities are seen.  IMPRESSION: Mild  vascular congestion and borderline cardiomegaly; mild bilateral atelectasis seen.   Electronically Signed   By: Roanna Raider M.D.   On: 02/18/2013 00:52   Ct Abdomen Pelvis W Contrast  02/18/2013   CLINICAL DATA:  Abdominal pain.  Massive anasarca.  EXAM: CT ABDOMEN AND PELVIS WITH CONTRAST  TECHNIQUE: Multidetector CT imaging of the abdomen and pelvis was performed using the standard protocol following bolus  administration of intravenous contrast.  CONTRAST:  OMNIPAQUE IOHEXOL 300 MG/ML  SOLN  COMPARISON:  CT 07/18/2010.  FINDINGS: Lung Bases: Dependent atelectasis.  Liver: Severe hepatic cirrhosis. Liver is small. Tiny area of low attenuation in the medial right hepatic lobe probably represents an area of scarring and is unchanged compared to 5/20 03/2010. There is no focal discrete mass lesion identified.  Spleen:  Splenomegaly.  Gallbladder:  Surgically absent.  Common bile duct:  Grossly normal.  Pancreas:  Normal.  Adrenal glands:  Normal.  Kidneys: Unchanged left upper pole renal cyst. Renal enhancement is within normal limits. Tiny right upper pole cystic lesion likely represents a simple cyst. The ureters grossly appear within normal limits.  Stomach: Gastric varices are present. Stomach is distended with food.  Small bowel: No small bowel inflammation. Edematous mesenteric is present, probably secondary to hepatic cirrhosis and hypoproteinemia.  Colon: Colonic diverticulosis. Oral contrast is present within the colon. No inflammatory changes. Normal appendix.  Pelvic Genitourinary: Foley catheter. Collapsed urinary bladder. Minimal free fluid in the anatomic pelvis.  Bones: Ankylosis of the SI joints. L3 compression fractures present which is new compared to the prior CT of 07/18/2010. Mild retropulsion. No aggressive osseous lesions. Loss of vertebral body height is about 50%.  Vasculature: Portal venous hypertension is present. Numerous collaterals are present in the upper abdomen. There is no recanalization of the periumbilical vein however there are all Large amount portal venous collaterals that extends through the anterior abdominal wall into the subcutaneous fat including around the umbilicus. No significant retroperitoneal collaterals are identified. Atherosclerosis without an acute vascular abnormality.  Body Wall: Anasarca. Varices are present in the subcutaneous fat associated with portal venous  hypertension.  IMPRESSION: 1. Severe hepatic cirrhosis. 2. Portal venous hypertension with varices extending from the abdomen into the abdominal subcutaneous fat. 3. Anasarca and mild ascites. 4. Splenomegaly. 5. Renal cysts. 6. New L3 compression fracture with 50% loss of vertebral body height. No paravertebral hematoma. Although new compared to 2012, this has features suggesting chronicity.   Electronically Signed   By: Andreas Newport M.D.   On: 02/18/2013 23:02   US Abdomen Limited  02/18/2013   CLINICAL DATA:  Ascites  EXAM: LIMITED ABDOMEN ULTRASOUND FOR ASCITES  TECHNIQUE: Limited ultrasound survey for ascites was performed in all four abdominal quadrants.  COMPARISON:  12/04/2012  FINDINGS: Ultrasound was performed to assess for ascites for possible paracentesis. There is a very small amount of free fluid noted in the right lower quadrant, not sufficient to tap at this time.  IMPRESSION: Small amount of right lower quadrant ascites, not sufficient to tap.   Electronically Signed   By: Charlett Nose M.D.   On: 02/18/2013 09:23    Scheduled Meds: . albumin human  12.5 g Intravenous Q6H  . ALPRAZolam  1 mg Oral BID  . ferrous sulfate  325 mg Oral QHS  . folic acid  1 mg Oral q morning - 10a  . furosemide  40 mg Intravenous Q12H  . insulin glargine  12 Units Subcutaneous QHS  . lactulose  30 g Oral 5 X Daily  . levETIRAcetam  500 mg Oral BID  . levothyroxine  75 mcg Oral q morning - 10a  . nadolol  40 mg Oral q morning - 10a  . oxyCODONE  10 mg Oral Q4H  . pantoprazole  40 mg Oral Daily  . PARoxetine  20 mg Oral Daily  . potassium chloride SA  20 mEq Oral q morning - 10a  . rifaximin  550 mg Oral BID  . risperiDONE  0.25 mg Oral BID  . spironolactone  100 mg Oral BID   Continuous Infusions:   Principal Problem:   Ascites Active Problems:   Hepatic encephalopathy   ETOH abuse   Hepatitis C   Cirrhosis   Seizure disorder   Thrombocytopenia, acquired   Hypothyroidism   Diabetes  mellitus type 2, uncontrolled, with complications   Moderate protein-calorie malnutrition    Time spent: 35 min    Hollice Espy  [facility] Pager [phone]. If 7PM-7AM, please contact night-coverage at [email or web address], password [facility] 02/19/2013, 5:00 PM  LOS: 2 days

## 2013-02-20 DIAGNOSIS — N5089 Other specified disorders of the male genital organs: Secondary | ICD-10-CM

## 2013-02-20 LAB — GLUCOSE, CAPILLARY
Glucose-Capillary: 144 mg/dL — ABNORMAL HIGH (ref 70–99)
Glucose-Capillary: 148 mg/dL — ABNORMAL HIGH (ref 70–99)
Glucose-Capillary: 151 mg/dL — ABNORMAL HIGH (ref 70–99)

## 2013-02-20 MED ORDER — ENSURE COMPLETE PO LIQD
237.0000 mL | Freq: Two times a day (BID) | ORAL | Status: DC
  Administered 2013-02-20 – 2013-02-25 (×10): 237 mL via ORAL

## 2013-02-20 MED ORDER — ADULT MULTIVITAMIN W/MINERALS CH
1.0000 | ORAL_TABLET | Freq: Every day | ORAL | Status: DC
  Administered 2013-02-20 – 2013-02-25 (×6): 1 via ORAL
  Filled 2013-02-20 (×6): qty 1

## 2013-02-20 MED ORDER — FUROSEMIDE 10 MG/ML IJ SOLN
40.0000 mg | Freq: Three times a day (TID) | INTRAMUSCULAR | Status: DC
  Administered 2013-02-20 – 2013-02-21 (×3): 40 mg via INTRAVENOUS
  Filled 2013-02-20 (×7): qty 4

## 2013-02-20 NOTE — Progress Notes (Signed)
INITIAL NUTRITION ASSESSMENT  DOCUMENTATION CODES Per approved criteria  -Severe malnutrition in the context of chronic illness -Obesity Unspecified   INTERVENTION: Add Ensure Complete po BID, each supplement provides 350 kcal and 13 grams of protein. Add MVI daily. RD to continue to follow nutrition care plan.  NUTRITION DIAGNOSIS: Increased nutrient needs related to chronic illness as evidenced by estimated needs.   Goal: Intake to meet >90% of estimated nutrition needs.  Monitor:  weight trends, lab trends, I/O's, PO intake, supplement tolerance  Reason for Assessment: MD Consult for Assessment  57 y.o. 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 Pt with frequent admissions with severe hepatic encephalopathy (8 x in past 6 months.) Admitted with scrotal and abdominal swelling. Work-up reveals ascites and anasarca.  Per GI note, pt doesn't eat anything with salt so doesn't calculate daily grams of sodium intake. He had been feeling well with exception of scrotal edema. He had one drink at game on Sunday, otherwise no ETOH in 9-10 months.  Pt is eating 100% of his Sodium-Restricted meals. He is sleeping, but awakes briefly to tell me that he is agreeable to Ensure BID. States his appetite is fine at this time.  Pt reports that his baseline weight is 240 lbs but at times his weight increases to over 300 lbs due to fluid accumulation.  GI evaluated, pt with significant anasarca, unable to tap much fluid. MD to try IV albumin with lasix to attempt to decrease third spacing.  Nutrition Focused Physical Exam:  Subcutaneous Fat:  Orbital Region: severe depletion Upper Arm Region: WNL Thoracic and Lumbar Region: n/a  Muscle:  Temple Region: moderate depletion Clavicle Bone Region: moderate depletion Clavicle and Acromion Bone Region: WNL Scapular Bone Region: n/a Dorsal Hand: n/a Patellar Region:  n/a Anterior Thigh Region: n/a Posterior Calf Region: n/a  Edema: 3+ pitting edema from knees down + scrotal edema  Pt meets criteria for severe MALNUTRITION in the context of chronic illness as evidenced by severe fat depletion and severe fluid accumulation.  Height: Ht Readings from Last 1 Encounters:  02/18/13 5\' 8"  (1.727 m)    Weight: Wt Readings from Last 1 Encounters:  02/11/13 254 lb (115.214 kg)    Ideal Body Weight: 154 lb  % Ideal Body Weight: 165%  Wt Readings from Last 10 Encounters:  02/11/13 254 lb (115.214 kg)  01/12/13 241 lb 2.9 oz (109.4 kg)  01/08/13 245 lb (111.131 kg)  12/28/12 228 lb 13.4 oz (103.8 kg)  12/11/12 258 lb 11.2 oz (117.346 kg)  11/12/12 281 lb 12 oz (127.8 kg)  10/14/12 310 lb 3 oz (140.7 kg)  10/08/12 325 lb 2.9 oz (147.5 kg)  10/02/12 302 lb (136.986 kg)  09/25/12 273 lb (123.832 kg)    Usual Body Weight: 240 lb, variable 2/2 fluid status  % Usual Body Weight: 106%  BMI:  38.6 - Obese Class II  Estimated Nutritional Needs: Kcal: 2200 - 2450 Protein: 100 - 110 g Fluid: 1.5 - 1.8 liters  Skin: intact  Diet Order: Sodium Restricted  EDUCATION NEEDS: -No education needs identified at this time   Intake/Output Summary (Last 24 hours) at 02/20/13 1324 Last data filed at 02/20/13 0420  Gross per 24 hour  Intake    240 ml  Output   3800 ml  Net  -3560 ml    Last BM: 12/25  Labs:   Recent Labs Lab 02/18/13 0234 02/19/13 0525  NA 132* 133*  K 3.7 3.7  CL 98 97  CO2 29 29  BUN 12 11  CREATININE 0.80 0.78  CALCIUM 8.1* 8.1*  GLUCOSE 147* 176*    CBG (last 3)   Recent Labs  02/20/13 0414 02/20/13 0740 02/20/13 1130  GLUCAP 137* 151* 148*    Scheduled Meds: . albumin human  12.5 g Intravenous Q6H  . ALPRAZolam  1 mg Oral BID  . ferrous sulfate  325 mg Oral QHS  . folic acid  1 mg Oral q morning - 10a  . furosemide  40 mg Intravenous Q8H  . insulin glargine  12 Units Subcutaneous QHS  . lactulose   30 g Oral 5 X Daily  . levETIRAcetam  500 mg Oral BID  . levothyroxine  75 mcg Oral q morning - 10a  . nadolol  40 mg Oral q morning - 10a  . oxyCODONE  10 mg Oral Q4H  . pantoprazole  40 mg Oral Daily  . PARoxetine  20 mg Oral Daily  . phytonadione  2.5 mg Oral Daily  . potassium chloride SA  20 mEq Oral q morning - 10a  . rifaximin  550 mg Oral BID  . risperiDONE  0.25 mg Oral BID  . spironolactone  100 mg Oral BID    Continuous Infusions:   Past Medical History  Diagnosis Date  . Cirrhosis of liver   . Hepatitis C   . ETOH abuse   . Hypothyroid   . Psychosis   . Bipolar disorder   . Seizures   . Arthritis   . Back pain   . Coagulopathy     Hx of  . Thrombocytopenia     Hx of  . Pancytopenia     Hx of  . H/O hypokalemia   . Hyponatremia     Hx of  . Korsakoff psychosis   . H/O abdominal abscess   . H/O esophageal varices   . Metabolic encephalopathy   . Hepatic encephalopathy   . Urinary urgency   . H/O renal failure   . H/O ascites   . Altered mental status   . Anemia   . Ascites   . Shortness of breath   . Peripheral vascular disease   . GERD (gastroesophageal reflux disease)   . Thrombocytopenia, acquired 06/02/2012  . Unspecified hypothyroidism 06/02/2012  . Pain in joint, pelvic region and thigh   . Edema   . Obesity, unspecified   . Chest pain, unspecified   . Pneumonitis due to inhalation of food or vomitus   . Other convulsions   . Cellulitis and abscess of upper arm and forearm   . Chronic hepatitis C without mention of hepatic coma   . Hypopotassemia   . Anemia, unspecified   . Other and unspecified coagulation defects   . Thrombocytopenia, unspecified   . Bipolar I disorder, most recent episode (or current) unspecified   . Unspecified psychosis   . Encephalopathy   . Esophageal varices without mention of bleeding   . Esophagitis, unspecified   . Abscess of liver(572.0)   . Chronic kidney disease, unspecified   . Lumbago   . Personal  history of alcoholism   . Personal history of tobacco use, presenting hazards to health   . Cirrhosis of liver without mention of alcohol   . Pneumonitis due to inhalation of food or vomitus 09/19/2010  . Chronic hepatitis C without mention of hepatic coma   . Bipolar I disorder, most recent  episode (or current) unspecified   . Unspecified psychosis   . Other ascites   . Personal history of tobacco use, presenting hazards to health   . Cirrhosis of liver without mention of alcohol   . Type 2 diabetes mellitus with diabetic neuropathy     Past Surgical History  Procedure Laterality Date  . Colostomy    . Cholecystectomy    . Small intestine surgery    . Arm surgery      left arm  . US guided drain placement in ventral hernia abscess  07/21/2010  . Multiple picc line placements    . Esophagogastroduodenoscopy  06/28/2011    Procedure: ESOPHAGOGASTRODUODENOSCOPY (EGD);  Surgeon: Louis Meckel, MD;  Location: [facility];  Service: Endoscopy;  Laterality: N/A;  . Esophagogastroduodenoscopy N/A 05/06/2012    Procedure: ESOPHAGOGASTRODUODENOSCOPY (EGD);  Surgeon: Louis Meckel, MD;  Location: [facility];  Service: Endoscopy;  Laterality: N/A;  . Esophageal banding N/A 05/06/2012    Procedure: ESOPHAGEAL BANDING;  Surgeon: Louis Meckel, MD;  Location: [facility];  Service: Endoscopy;  Laterality: N/A;    Jarold Motto MS, RD, LDN Pager: [phone] After-hours pager: [phone]

## 2013-02-20 NOTE — Progress Notes (Signed)
Clinical Social Work Department BRIEF PSYCHOSOCIAL ASSESSMENT 02/20/2013  Patient:  Tony Boyd, Tony Boyd     Account Number:  [id number]     Admit date:  02/17/2013  Clinical Social Worker:  Dennison Bulla  Date/Time:  02/20/2013 09:15 AM  Referred by:  RN  Date Referred:  02/20/2013 Referred for  SNF Placement   Other Referral:   Interview type:  Patient Other interview type:    PSYCHOSOCIAL DATA Living Status:  ALONE Admitted from facility:   Level of care:   Primary support name:  [name] Primary support relationship to patient:  CHILD, ADULT Degree of support available:   Adequate    CURRENT CONCERNS Current Concerns  Post-Acute Placement   Other Concerns:    SOCIAL WORK ASSESSMENT / PLAN CSW spoke with [facility] representative who reports concern about patient returning home and reports that patient might need to go to SNF at [place]. CSW checked on Medicare days and patient still has about 75 days left. [place] information was submitted on 02/18/13 in case SNF was needed.    CSW met with patient at bedside. CSW introduced myself and explained role. Patient reports that he lives at home and ambulates with walker. Patient reports he has been to SNF in the past and does not feel it is needed at this time. Patient reports he has been ambulating at the hospital and using his walker. Patient understands that SNF is an option but prefers to return home.    CSW is signing off but available if further needs arise.   Assessment/plan status:  No Further Intervention Required Other assessment/ plan:   Information/referral to community resources:   SNF information    PATIENT'S/FAMILY'S RESPONSE TO PLAN OF CARE: Patient alert and oriented and engaged in assessment. Patient reports that SNF is not needed but thanked CSW for time. Patient reports no safety concerns with returning home. Patient reports no further needs.       Unk Lightning, LCSW (Coverage for [employer])

## 2013-02-20 NOTE — Progress Notes (Signed)
Progress Note   Subjective  No specific complaints.   Objective   Vital signs in last 24 hours: Temp:  [98.3 F (36.8 C)-100 F (37.8 C)] 100 F (37.8 C) (12/26 0648) Pulse Rate:  [68-88] 88 (12/26 0648) Resp:  [18-20] 20 (12/26 0648) BP: (110-156)/(53-82) 120/53 mmHg (12/26 0648) SpO2:  [81 %-99 %] 96 % (12/26 0648) Last BM Date: 02/19/13 General:    Pleasant white male in NAD Abdomen:  Soft, nontender and nondistended. Normal bowel sounds. Neurologic:  Alert and oriented,  grossly normal neurologically. Psych:  Cooperative. Normal mood and affect.  Lab Results:  Recent Labs  02/18/13 0234  WBC 3.8*  HGB 9.3*  HCT 27.6*  PLT 48*   BMET  Recent Labs  02/18/13 0234 02/19/13 0525  NA 132* 133*  K 3.7 3.7  CL 98 97  CO2 29 29  GLUCOSE 147* 176*  BUN 12 11  CREATININE 0.80 0.78  CALCIUM 8.1* 8.1*   LFT  Recent Labs  02/19/13 0525  PROT 6.1  ALBUMIN 2.2*  AST 51*  ALT 22  ALKPHOS 158*  BILITOT 2.3*   PT/INR  Recent Labs  02/18/13 0234  LABPROT 21.1*  INR 1.89*    Studies/Results: Ct Abdomen Pelvis W Contrast  02/18/2013   CLINICAL DATA:  Abdominal pain.  Massive anasarca.  EXAM: CT ABDOMEN AND PELVIS WITH CONTRAST  TECHNIQUE: Multidetector CT imaging of the abdomen and pelvis was performed using the standard protocol following bolus administration of intravenous contrast.  CONTRAST:  OMNIPAQUE IOHEXOL 300 MG/ML  SOLN  COMPARISON:  CT 07/18/2010.  FINDINGS: Lung Bases: Dependent atelectasis.  Liver: Severe hepatic cirrhosis. Liver is small. Tiny area of low attenuation in the medial right hepatic lobe probably represents an area of scarring and is unchanged compared to 5/20 03/2010. There is no focal discrete mass lesion identified.  Spleen:  Splenomegaly.  Gallbladder:  Surgically absent.  Common bile duct:  Grossly normal.  Pancreas:  Normal.  Adrenal glands:  Normal.  Kidneys: Unchanged left upper pole renal cyst. Renal enhancement is  within normal limits. Tiny right upper pole cystic lesion likely represents a simple cyst. The ureters grossly appear within normal limits.  Stomach: Gastric varices are present. Stomach is distended with food.  Small bowel: No small bowel inflammation. Edematous mesenteric is present, probably secondary to hepatic cirrhosis and hypoproteinemia.  Colon: Colonic diverticulosis. Oral contrast is present within the colon. No inflammatory changes. Normal appendix.  Pelvic Genitourinary: Foley catheter. Collapsed urinary bladder. Minimal free fluid in the anatomic pelvis.  Bones: Ankylosis of the SI joints. L3 compression fractures present which is new compared to the prior CT of 07/18/2010. Mild retropulsion. No aggressive osseous lesions. Loss of vertebral body height is about 50%.  Vasculature: Portal venous hypertension is present. Numerous collaterals are present in the upper abdomen. There is no recanalization of the periumbilical vein however there are all Large amount portal venous collaterals that extends through the anterior abdominal wall into the subcutaneous fat including around the umbilicus. No significant retroperitoneal collaterals are identified. Atherosclerosis without an acute vascular abnormality.  Body Wall: Anasarca. Varices are present in the subcutaneous fat associated with portal venous hypertension.  IMPRESSION: 1. Severe hepatic cirrhosis. 2. Portal venous hypertension with varices extending from the abdomen into the abdominal subcutaneous fat. 3. Anasarca and mild ascites. 4. Splenomegaly. 5. Renal cysts. 6. New L3 compression fracture with 50% loss of vertebral body height. No paravertebral hematoma. Although new compared to 2012,  this has features suggesting chronicity.   Electronically Signed   By: Andreas Newport M.D.   On: 02/18/2013 23:02     Assessment / Plan:    HCV / ETOH cirrhosis. AFP was elevated several months back, it has almost doubled to 242 now but no hepatic masses on  contrast CTscan this admission. Patient is massively volume overloaded but with minimal ascites. Coagulopathy is chronic. He appears stable from standpoint of cirrhosis, not sure why he is so volume overloaded. CTscan negative for thrombus or obstructing masses. Diuresing well, renal function remains normal on diuretics. On low sodium diet. On nadolol. Due for surveillance EGD in March 2015. No acute liver related issues.    LOS: 3 days   Willette Cluster  02/20/2013, 9:46 AM  GI Attending Note  I have personally taken an interval history, reviewed the chart, and examined the patient.  I agree with the extender's note, impression and recommendations.  Will followup as an outpt.  Barbette Hair. Arlyce Dice, MD, [facility] [phone]

## 2013-02-20 NOTE — Progress Notes (Signed)
[facility] PROGRESS NOTE  Tony Boyd ION:[id number] DOB: August 13, 1955 DOA: 02/17/2013 PCP: [name], DO  Assessment/Plan: Principal Problem:   Ascites/anasarca: Continue Lasix plus Spironolactone plus IV albumin. CT scan confirms portal hypertension, but no signs of any obstructive mass. Cause looks to be secondary to third spacing given poor albumin levels from nutrition plus liver disease. Patient's renal function is remaining stable and given normal echocardiogram will increase IV Lasix. Active Problems:   Hepatic encephalopathy: Currently stable. Watching ammonia levels, continue rifaximin and lactulose. Noted increase in ammonia, however patient still appears to be alert and appropriate. 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 Stable.  Coagulopathy:Will start vitamin K daily    Hypothyroidism: Continue Synthroid    Diabetes mellitus type 2, uncontrolled, with complications: On Lantus plus sliding scale, CBG is below 200    Moderate protein-calorie malnutrition: On IV albumin, nutrition consult   Code Status: Partial code  Family Communication: Left message with daughter  Disposition Plan: Continue diuresis as much as possible   Consultants:  [place] [facility]  Procedures:  None  Antibiotics:  None  HPI/Subjective: Patient doing okay. No complaints. Still feels very swollen.  Objective: Filed Vitals:   02/20/13 0648  BP: 120/53  Pulse: 88  Temp: 100 F (37.8 C)  Resp: 20    Intake/Output Summary (Last 24 hours) at 02/20/13 1219 Last data filed at 02/20/13 0420  Gross per 24 hour  Intake    240 ml  Output   3800 ml  Net  -3560 ml   There were no vitals filed for this visit.  Exam:   General:  Alert  and oriented x3, no acute distress  Cardiovascular: Regular rate and rhythm, S1-S2  Respiratory: Clear to auscultation bilaterally, limited by body habitus  Abdomen: Soft, obese, nontender, positive bowel sounds  Musculoskeletal: No clubbing or cyanosis, signs of chronic venous stasis of his lower extremities, 3+ pitting edema from the knees down as well scrotal edema   Data Reviewed: Basic Metabolic Panel:  Recent Labs Lab 02/18/13 0234 02/19/13 0525  NA 132* 133*  K 3.7 3.7  CL 98 97  CO2 29 29  GLUCOSE 147* 176*  BUN 12 11  CREATININE 0.80 0.78  CALCIUM 8.1* 8.1*   Liver Function Tests:  Recent Labs Lab 02/18/13 0234 02/19/13 0525  AST 46* 51*  ALT 21 22  ALKPHOS 155* 158*  BILITOT 2.3* 2.3*  PROT 6.0 6.1  ALBUMIN 1.9* 2.2*   No results found for this basename: LIPASE, AMYLASE,  in the last 168 hours  Recent Labs Lab 02/19/13 0525  AMMONIA 128*   CBC:  Recent Labs Lab 02/18/13 0234  WBC 3.8*  NEUTROABS 2.1  HGB 9.3*  HCT 27.6*  MCV 103.0*  PLT 48*   Cardiac Enzymes: No results found for this basename: CKTOTAL, CKMB, CKMBINDEX, TROPONINI,  in the last 168 hours BNP (last 3 results)  Recent Labs  12/03/12 1435 01/11/13 2055 02/18/13 0234  PROBNP 297.7* 207.4* 985.8*   CBG:  Recent Labs Lab 02/20/13 0030 02/20/13 0150 02/20/13 0414 02/20/13 0740 02/20/13 1130  GLUCAP 144* 141* 137* 151* 148*    No results found for this or any previous visit (from the past 240 hour(s)).   Studies: Ct Abdomen Pelvis W Contrast  02/18/2013   CLINICAL DATA:  Abdominal pain.  Massive anasarca.  EXAM: CT ABDOMEN AND PELVIS WITH CONTRAST  TECHNIQUE: Multidetector CT imaging of the abdomen and pelvis was performed using the standard protocol following bolus administration of intravenous contrast.  CONTRAST:  OMNIPAQUE IOHEXOL 300 MG/ML  SOLN  COMPARISON:  CT 07/18/2010.  FINDINGS: Lung Bases: Dependent atelectasis.  Liver: Severe hepatic cirrhosis.  Liver is small. Tiny area of low attenuation in the medial right hepatic lobe probably represents an area of scarring and is unchanged compared to 5/20 03/2010. There is no focal discrete mass lesion identified.  Spleen:  Splenomegaly.  Gallbladder:  Surgically absent.  Common bile duct:  Grossly normal.  Pancreas:  Normal.  Adrenal glands:  Normal.  Kidneys: Unchanged left upper pole renal cyst. Renal enhancement is within normal limits. Tiny right upper pole cystic lesion likely represents a simple cyst. The ureters grossly appear within normal limits.  Stomach: Gastric varices are present. Stomach is distended with food.  Small bowel: No small bowel inflammation. Edematous mesenteric is present, probably secondary to hepatic cirrhosis and hypoproteinemia.  Colon: Colonic diverticulosis. Oral contrast is present within the colon. No inflammatory changes. Normal appendix.  Pelvic Genitourinary: Foley catheter. Collapsed urinary bladder. Minimal free fluid in the anatomic pelvis.  Bones: Ankylosis of the SI joints. L3 compression fractures present which is new compared to the prior CT of 07/18/2010. Mild retropulsion. No aggressive osseous lesions. Loss of vertebral body height is about 50%.  Vasculature: Portal venous hypertension is present. Numerous collaterals are present in the upper abdomen. There is no recanalization of the periumbilical vein however there are all Large amount portal venous collaterals that extends through the anterior abdominal wall into the subcutaneous fat including around the umbilicus. No significant retroperitoneal collaterals are identified. Atherosclerosis without an acute vascular abnormality.  Body Wall: Anasarca. Varices are present in the subcutaneous fat associated with portal venous hypertension.  IMPRESSION: 1. Severe hepatic cirrhosis. 2. Portal venous hypertension with varices extending from the abdomen into the abdominal subcutaneous fat. 3. Anasarca and mild ascites. 4.  Splenomegaly. 5. Renal cysts. 6. New L3 compression fracture with 50% loss of vertebral body height. No paravertebral hematoma. Although new compared to 2012, this has features suggesting chronicity.   Electronically Signed   By: Andreas Newport M.D.   On: 02/18/2013 23:02    Scheduled Meds: . albumin human  12.5 g Intravenous Q6H  . ALPRAZolam  1 mg Oral BID  . ferrous sulfate  325 mg Oral QHS  . folic acid  1 mg Oral q morning - 10a  . furosemide  40 mg Intravenous Q8H  . insulin glargine  12 Units Subcutaneous QHS  . lactulose  30 g Oral 5 X Daily  . levETIRAcetam  500 mg Oral BID  . levothyroxine  75 mcg Oral q morning - 10a  . nadolol  40 mg Oral q morning - 10a  . oxyCODONE  10 mg Oral Q4H  . pantoprazole  40 mg Oral Daily  . PARoxetine  20 mg Oral Daily  . phytonadione  2.5 mg Oral Daily  . potassium chloride SA  20 mEq Oral q morning - 10a  . rifaximin  550 mg Oral BID  . risperiDONE  0.25 mg Oral BID  . spironolactone  100 mg Oral BID   Continuous Infusions:   Principal Problem:   Ascites Active Problems:   Hepatic encephalopathy   ETOH abuse   Hepatitis C   Cirrhosis  Seizure disorder   Thrombocytopenia, acquired   Hypothyroidism   Diabetes mellitus type 2, uncontrolled, with complications   Moderate protein-calorie malnutrition    Time spent: 25 min    Hollice Espy  [facility] Pager [phone]. If 7PM-7AM, please contact night-coverage at [email or web address], password [facility] 02/20/2013, 12:19 PM  LOS: 3 days

## 2013-02-21 DIAGNOSIS — E1165 Type 2 diabetes mellitus with hyperglycemia: Secondary | ICD-10-CM

## 2013-02-21 LAB — BASIC METABOLIC PANEL
BUN: 21 mg/dL (ref 6–23)
Calcium: 8.3 mg/dL — ABNORMAL LOW (ref 8.4–10.5)
Chloride: 99 mEq/L (ref 96–112)
Creatinine, Ser: 1.4 mg/dL — ABNORMAL HIGH (ref 0.50–1.35)
GFR calc Af Amer: 63 mL/min — ABNORMAL LOW (ref >90)
GFR calc non Af Amer: 54 mL/min — ABNORMAL LOW (ref >90)
Sodium: 134 mEq/L — ABNORMAL LOW (ref 135–145)

## 2013-02-21 LAB — GLUCOSE, CAPILLARY
Glucose-Capillary: 181 mg/dL — ABNORMAL HIGH (ref 70–99)
Glucose-Capillary: 190 mg/dL — ABNORMAL HIGH (ref 70–99)

## 2013-02-21 MED ORDER — FUROSEMIDE 10 MG/ML IJ SOLN
40.0000 mg | Freq: Two times a day (BID) | INTRAMUSCULAR | Status: DC
  Administered 2013-02-21 – 2013-02-22 (×2): 40 mg via INTRAVENOUS
  Filled 2013-02-21 (×4): qty 4

## 2013-02-21 NOTE — Progress Notes (Addendum)
[facility] PROGRESS NOTE  Tony Boyd:[id number] DOB: [birth year] DOA: 02/17/2013 PCP: [name], DO  Assessment/Plan: Principal Problem:   Ascites/anasarca: Continue Lasix plus Spironolactone plus IV albumin. CT scan confirms portal hypertension, but no signs of any obstructive mass. Cause looks to be secondary to third spacing given poor albumin levels from nutrition plus liver disease.  With increased Lasix, noted worsening renal function, will decrease dose. Will start to ambulate  Active Problems:   Hepatic encephalopathy: Currently stable. Watching ammonia levels, continue rifaximin and lactulose. Noted increase in ammonia, however patient still appears to be alert and appropriate. 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 Stable.  Coagulopathy:Will start vitamin K daily    Hypothyroidism: Continue Synthroid    Diabetes mellitus type 2, uncontrolled, with complications: On Lantus plus sliding scale, CBG is below 200  Severe protein-calorie malnutrition: In the context of chronic illness as evidenced by severe fat depletion and severe fluid accumulation. Appreciate nutrition.  Added Ensure & MVI.  Code Status: Partial code  Family Communication: Left message with daughter yesterday  Disposition Plan: Continue diuresis as much as possible   Consultants:  [place] [facility]  Nutrition  Procedures:  None  Antibiotics:  None  HPI/Subjective: Somnolent, resting comfortably  Objective: Filed Vitals:   02/21/13 0634  BP: 107/65  Pulse: 77  Temp: 98 F (36.7 C)  Resp: 16    Intake/Output Summary (Last 24 hours) at 02/21/13 1347 Last data filed at 02/21/13 1100  Gross per 24 hour  Intake    600 ml  Output     803 ml  Net   -203 ml   Filed Weights   02/21/13 0500  Weight: 133.584 kg (294 lb 8 oz)    Exam:   General:  More somnolent today  Cardiovascular: Regular rate and rhythm, S1-S2  Respiratory: Clear to auscultation bilaterally, limited by body habitus  Abdomen: Soft, obese, nontender, positive bowel sounds  Musculoskeletal: No clubbing or cyanosis, signs of chronic venous stasis of his lower extremities, 3+ pitting edema from the knees down as well scrotal edema , mild improvement  Data Reviewed: Basic Metabolic Panel:  Recent Labs Lab 02/18/13 0234 02/19/13 0525 02/21/13 0620  NA 132* 133* 134*  K 3.7 3.7 4.7  CL 98 97 99  CO2 29 29 31   GLUCOSE 147* 176* 194*  BUN 12 11 21   CREATININE 0.80 0.78 1.40*  CALCIUM 8.1* 8.1* 8.3*   Liver Function Tests:  Recent Labs Lab 02/18/13 0234 02/19/13 0525  AST 46* 51*  ALT 21 22  ALKPHOS 155* 158*  BILITOT 2.3* 2.3*  PROT 6.0 6.1  ALBUMIN 1.9* 2.2*   No results found for this basename: LIPASE, AMYLASE,  in the last 168 hours  Recent Labs Lab 02/19/13 0525  AMMONIA 128*   CBC:  Recent Labs Lab 02/18/13 0234  WBC 3.8*  NEUTROABS 2.1  HGB 9.3*  HCT 27.6*  MCV 103.0*  PLT 48*   Cardiac Enzymes: No results found for this basename: CKTOTAL, CKMB, CKMBINDEX, TROPONINI,  in the last 168 hours BNP (last 3 results)  Recent Labs  12/03/12 1435 01/11/13 2055 02/18/13 0234  PROBNP 297.7* 207.4* 985.8*   CBG:  Recent Labs Lab 02/20/13 2001 02/21/13 0009 02/21/13 0412 02/21/13  0747 02/21/13 1116  GLUCAP 190* 181* 196* 178* 199*    No results found for this or any previous visit (from the past 240 hour(s)).   Studies: No results found.  Scheduled Meds: . albumin human  12.5 g Intravenous Q6H  . ALPRAZolam  1 mg Oral BID  . feeding supplement (ENSURE COMPLETE)  237 mL Oral BID BM  . ferrous sulfate  325 mg Oral QHS  . folic acid  1 mg Oral q morning - 10a  . furosemide  40 mg Intravenous  Q12H  . insulin glargine  12 Units Subcutaneous QHS  . lactulose  30 g Oral 5 X Daily  . levETIRAcetam  500 mg Oral BID  . levothyroxine  75 mcg Oral q morning - 10a  . multivitamin with minerals  1 tablet Oral Daily  . nadolol  40 mg Oral q morning - 10a  . oxyCODONE  10 mg Oral Q4H  . pantoprazole  40 mg Oral Daily  . PARoxetine  20 mg Oral Daily  . phytonadione  2.5 mg Oral Daily  . potassium chloride SA  20 mEq Oral q morning - 10a  . rifaximin  550 mg Oral BID  . risperiDONE  0.25 mg Oral BID  . spironolactone  100 mg Oral BID   Continuous Infusions:   Principal Problem:   Ascites Active Problems:   Hepatic encephalopathy   ETOH abuse   Hepatitis C   Cirrhosis   Seizure disorder   Thrombocytopenia, acquired   Hypothyroidism   Diabetes mellitus type 2, uncontrolled, with complications   Moderate protein-calorie malnutrition    Time spent: 25 min    Hollice Espy  [facility] Pager [phone]. If 7PM-7AM, please contact night-coverage at [email or web address], password [facility] 02/21/2013, 1:47 PM  LOS: 4 days

## 2013-02-22 DIAGNOSIS — E039 Hypothyroidism, unspecified: Secondary | ICD-10-CM

## 2013-02-22 DIAGNOSIS — E43 Unspecified severe protein-calorie malnutrition: Secondary | ICD-10-CM

## 2013-02-22 LAB — BASIC METABOLIC PANEL
BUN: 24 mg/dL — ABNORMAL HIGH (ref 6–23)
Calcium: 8.4 mg/dL (ref 8.4–10.5)
Chloride: 101 mEq/L (ref 96–112)
Creatinine, Ser: 1.17 mg/dL (ref 0.50–1.35)
GFR calc Af Amer: 78 mL/min — ABNORMAL LOW (ref >90)
GFR calc non Af Amer: 68 mL/min — ABNORMAL LOW (ref >90)
Glucose, Bld: 166 mg/dL — ABNORMAL HIGH (ref 70–99)
Potassium: 4.7 mEq/L (ref 3.5–5.1)

## 2013-02-22 LAB — GLUCOSE, CAPILLARY
Glucose-Capillary: 146 mg/dL — ABNORMAL HIGH (ref 70–99)
Glucose-Capillary: 153 mg/dL — ABNORMAL HIGH (ref 70–99)
Glucose-Capillary: 170 mg/dL — ABNORMAL HIGH (ref 70–99)
Glucose-Capillary: 183 mg/dL — ABNORMAL HIGH (ref 70–99)

## 2013-02-22 MED ORDER — ALBUMIN HUMAN 25 % IV SOLN
25.0000 g | Freq: Four times a day (QID) | INTRAVENOUS | Status: DC
  Administered 2013-02-23 – 2013-02-25 (×8): 25 g via INTRAVENOUS
  Filled 2013-02-22 (×19): qty 100

## 2013-02-22 MED ORDER — FUROSEMIDE 80 MG PO TABS: 80.0000 mg | ORAL_TABLET | ORAL | Status: DC

## 2013-02-22 MED ORDER — FUROSEMIDE 80 MG PO TABS
80.0000 mg | ORAL_TABLET | Freq: Two times a day (BID) | ORAL | Status: DC
  Administered 2013-02-22 – 2013-02-25 (×6): 80 mg via ORAL
  Filled 2013-02-22 (×8): qty 1

## 2013-02-22 NOTE — Progress Notes (Addendum)
IV team unable to put in picc line. Dr. Rito Ehrlich called. Also told him the pt has not been able to get his Albumin. IR will try to get a pick in 12/29. Lab unable to draw an ammonia level. Pt using a walker and standby assist to ambulate to the bathroom.

## 2013-02-22 NOTE — Progress Notes (Signed)
[facility] PROGRESS NOTE  Tony Boyd ZOX:[id number] DOB: [birth year] DOA: 02/17/2013 PCP: [name], DO  Assessment/Plan: Principal Problem:   Ascites/anasarca: Continue Lasix plus Spironolactone plus IV albumin. CT scan confirms portal hypertension, but no signs of any obstructive mass. Cause looks to be secondary to third spacing given poor albumin levels from nutrition plus liver disease.  Increasing Lasix dose like to worsening renal function. Decreased dose and renal function better. Continue IV albumin and monitor output.  Active Problems:   Hepatic encephalopathy: Currently stable. Watching ammonia levels, continue rifaximin and lactulose. Noted increase in ammonia, however patient still appears to be alert and appropriate. 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 Stable.  Coagulopathy:Will start vitamin K daily    Hypothyroidism: Continue Synthroid    Diabetes mellitus type 2, uncontrolled, with complications: On Lantus plus sliding scale, CBG is below 200  Severe protein-calorie malnutrition: In the context of chronic illness as evidenced by severe fat depletion and severe fluid accumulation. Appreciate nutrition.  Added Ensure & MVI.  Code Status: Partial code  Family Communication: Left message with daughter   Disposition Plan: Continue diuresis as much as possible   Consultants:  [place] [facility]  Nutrition  Procedures:  Have ordered PICC line for IV access and labs  Antibiotics:  None  HPI/Subjective: Patient doing okay. Feels like swelling starting to come down.  Objective: Filed Vitals:   02/22/13 0500  BP: 113/62  Pulse: 71  Temp: 98.1 F (36.7 C)  Resp: 18    Intake/Output Summary (Last 24  hours) at 02/22/13 1043 Last data filed at 02/22/13 0900  Gross per 24 hour  Intake   1310 ml  Output    803 ml  Net    507 ml   Filed Weights   02/21/13 0500 02/22/13 0500  Weight: 133.584 kg (294 lb 8 oz) 133 kg (293 lb 3.4 oz)    Exam:   General:  More somnolent today  Cardiovascular: Regular rate and rhythm, S1-S2  Respiratory: Mild bilateral wheezing  Abdomen: Soft, obese, nontender, positive bowel sounds  Musculoskeletal: No clubbing or cyanosis, signs of chronic venous stasis of his lower extremities, 3+ pitting edema from the knees down as well scrotal edema , mild improvement  Data Reviewed: Basic Metabolic Panel:  Recent Labs Lab 02/18/13 0234 02/19/13 0525 02/21/13 0620 02/22/13 0630  NA 132* 133* 134* 134*  K 3.7 3.7 4.7 4.7  CL 98 97 99 101  CO2 29 29 31 29   GLUCOSE 147* 176* 194* 166*  BUN 12 11 21  24*  CREATININE 0.80 0.78 1.40* 1.17  CALCIUM 8.1* 8.1* 8.3* 8.4   Liver Function Tests:  Recent Labs Lab 02/18/13 0234 02/19/13 0525  AST 46* 51*  ALT 21 22  ALKPHOS 155* 158*  BILITOT 2.3* 2.3*  PROT 6.0 6.1  ALBUMIN 1.9* 2.2*   No results found for this basename: LIPASE, AMYLASE,  in the last 168 hours  Recent Labs Lab 02/19/13 0525  AMMONIA 128*   CBC:  Recent Labs Lab 02/18/13 0234  WBC 3.8*  NEUTROABS 2.1  HGB 9.3*  HCT 27.6*  MCV 103.0*  PLT 48*   Cardiac Enzymes: No results found for this basename: CKTOTAL, CKMB, CKMBINDEX, TROPONINI,  in the last 168 hours BNP (  last 3 results)  Recent Labs  12/03/12 1435 01/11/13 2055 02/18/13 0234  PROBNP 297.7* 207.4* 985.8*   CBG:  Recent Labs Lab 02/21/13 1602 02/21/13 2016 02/22/13 0029 02/22/13 0524 02/22/13 0718  GLUCAP 210* 177* 170* 155* 153*    No results found for this or any previous visit (from the past 240 hour(s)).   Studies: No results found.  Scheduled Meds: . albumin human  25 g Intravenous Q6H  . ALPRAZolam  1 mg Oral BID  . feeding supplement  (ENSURE COMPLETE)  237 mL Oral BID BM  . ferrous sulfate  325 mg Oral QHS  . folic acid  1 mg Oral q morning - 10a  . furosemide  40 mg Intravenous Q12H  . insulin glargine  12 Units Subcutaneous QHS  . lactulose  30 g Oral 5 X Daily  . levETIRAcetam  500 mg Oral BID  . levothyroxine  75 mcg Oral q morning - 10a  . multivitamin with minerals  1 tablet Oral Daily  . nadolol  40 mg Oral q morning - 10a  . oxyCODONE  10 mg Oral Q4H  . pantoprazole  40 mg Oral Daily  . PARoxetine  20 mg Oral Daily  . phytonadione  2.5 mg Oral Daily  . potassium chloride SA  20 mEq Oral q morning - 10a  . rifaximin  550 mg Oral BID  . risperiDONE  0.25 mg Oral BID  . spironolactone  100 mg Oral BID   Continuous Infusions:   Principal Problem:   Ascites Active Problems:   Hepatic encephalopathy   ETOH abuse   Hepatitis C   Cirrhosis   Seizure disorder   Thrombocytopenia, acquired   Hypothyroidism   Diabetes mellitus type 2, uncontrolled, with complications   Severe protein-calorie malnutrition    Time spent: 25 min    Hollice Espy  [facility] Pager [phone]. If 7PM-7AM, please contact night-coverage at [email or web address], password [facility] 02/22/2013, 10:43 AM  LOS: 5 days

## 2013-02-22 NOTE — Evaluation (Signed)
Physical Therapy Evaluation Patient Details Name: Tony Boyd MRN: [id number] DOB: 04-May-1955 Today's Date: 02/22/2013 Time: 4098-1191 PT Time Calculation (min): 38 min  PT Assessment / Plan / Recommendation History of Present Illness  Pt is an 57 y.o. male with hx of bipolar disorder, hep C, alcoholic cirrhosis, frequent admission for hepatic encephalopathy, hx of seizure, coagulopathy, who was recommended SNF, or ALF, but he was deemed competent by psychiatry, and had refused placement in the past.  He was again found unresponsive at home on the floor reportedly by family, and EMS had brought him here.  There were no reported of witnessed seizure, tongue biting, but in the ER, he was progressively become more alert, and anwered a few questions. Evalaution in the ER included a normal NH3 level, normal renal fx tests along with normal WBC, and unremarkable LFTs.  His head CT and CXR showed no acute processes.  Hospitalist was asked to admit him for altered mental status  Clinical Impression  Pt currently presenting with functional mobility limitations 2* to generalized weakness, scrotal edema, and SOB with exertion.  Pt able to maintain O2 sats at 92% and above with ambulation to bathroom on RA but desats to 79% with ambulation in hall....recovery to 90%+ after ~ one minute.  Pt hopes to return to previous living arrangement but feels he may require increased level of assist dependent on acute stay progress.    PT Assessment  Patient needs continued PT services    Follow Up Recommendations  Home health PT;SNF (dependent on acute stay progress)    Does the patient have the potential to tolerate intense rehabilitation      Barriers to Discharge Decreased caregiver support IND living apt    Equipment Recommendations  None recommended by PT    Recommendations for Other Services OT consult   Frequency Min 3X/week    Precautions / Restrictions Precautions Precautions:  Fall Restrictions Weight Bearing Restrictions: No   Pertinent Vitals/Pain Pt reports scrotal discomfort 3/10 with activity.      Mobility  Bed Mobility Bed Mobility: Supine to Sit;Sit to Supine Supine to Sit: 4: Min assist;HOB elevated;With rails Sit to Supine: 4: Min assist;With rail Details for Bed Mobility Assistance: Min assist with LEs Transfers Transfers: Sit to Stand;Stand to Sit Sit to Stand: 4: Min assist;From bed;From chair/3-in-1;With upper extremity assist Stand to Sit: 4: Min assist;To bed;To chair/3-in-1;With upper extremity assist Details for Transfer Assistance: cues for transition position and use of UEs to self assist Ambulation/Gait Ambulation/Gait Assistance: 4: Min assist Ambulation Distance (Feet): 75 Feet (twice) Assistive device: Rolling walker Ambulation/Gait Assistance Details: cues for posture and position from RW Gait Pattern: Step-through pattern;Decreased step length - right;Decreased step length - left;Shuffle;Wide base of support    Exercises     PT Diagnosis: Difficulty walking  PT Problem List: Decreased strength;Decreased range of motion;Decreased activity tolerance;Decreased mobility;Decreased balance;Decreased knowledge of use of DME;Other (comment);Decreased safety awareness (enlarged scrotom requiring widened BOS ) PT Treatment Interventions: DME instruction;Gait training;Functional mobility training;Therapeutic activities;Therapeutic exercise;Patient/family education;Balance training     PT Goals(Current goals can be found in the care plan section) Acute Rehab PT Goals Patient Stated Goal: Resume previous lifestyle PT Goal Formulation: With patient Time For Goal Achievement: 03/07/13 Potential to Achieve Goals: Good  Visit Information  Last PT Received On: 02/22/13 Assistance Needed: +1 History of Present Illness: Pt is an 57 y.o. male with hx of bipolar disorder, hep C, alcoholic cirrhosis, frequent admission for hepatic  encephalopathy, hx of seizure, coagulopathy,  who was recommended SNF, or ALF, but he was deemed competent by psychiatry, and had refused placement in the past.  He was again found unresponsive at home on the floor reportedly by family, and EMS had brought him here.  There were no reported of witnessed seizure, tongue biting, but in the ER, he was progressively become more alert, and anwered a few questions. Evalaution in the ER included a normal NH3 level, normal renal fx tests along with normal WBC, and unremarkable LFTs.  His head CT and CXR showed no acute processes.  Hospitalist was asked to admit him for altered mental status       Prior Functioning  Home Living Family/patient expects to be discharged to:: Other (Comment) (INdependent living) Additional Comments: Pt states he may need to follow up at higher level of care Prior Function Level of Independence: Independent with assistive device(s) Comments: Pt reports he has RW and BSC Communication Communication: No difficulties Dominant Hand: Right    Cognition  Cognition Arousal/Alertness: Awake/alert Behavior During Therapy: WFL for tasks assessed/performed Overall Cognitive Status: Within Functional Limits for tasks assessed    Extremity/Trunk Assessment Upper Extremity Assessment Upper Extremity Assessment: Generalized weakness Lower Extremity Assessment Lower Extremity Assessment: Generalized weakness   Balance Static Sitting Balance Static Sitting - Balance Support: No upper extremity supported Static Sitting - Level of Assistance: 7: Independent Static Sitting - Comment/# of Minutes: 2 Static Standing Balance Static Standing - Balance Support: Right upper extremity supported Static Standing - Level of Assistance: 5: Stand by assistance Static Standing - Comment/# of Minutes: 1  End of Session PT - End of Session Activity Tolerance: Patient tolerated treatment well;Patient limited by fatigue Patient left: in bed;with call  bell/phone within reach Nurse Communication: Mobility status  GP     Kaitrin Seybold 02/22/2013, 12:53 PM

## 2013-02-22 NOTE — Progress Notes (Signed)
Assessed Pt for bed side PICC placement.  Unable to find any suitable veins for PICC insertion.  Left arm basilic and brachial veins PICC would occupy over 75% of vein.  Unable to locate cephalic vein.  Rt. Arm very swollen and bruised.  Unable to locate Rt cephalic vein; Rt basilic vein PICC would occupy over 100%.  Brachial vein is of suitable size but close to artery and in area of bruised/ hematoma of Rt. Arm.  If PICC line desired may consider referral to IR for placement.

## 2013-02-22 NOTE — Plan of Care (Addendum)
Problem: Phase II Progression Outcomes Goal: IV changed to normal saline lock Outcome: Progressing To get picc 12/29. Pt not up to ambulating in the hall, sleeping long intervals

## 2013-02-23 ENCOUNTER — Inpatient Hospital Stay (HOSPITAL_COMMUNITY): Payer: Medicare Other

## 2013-02-23 LAB — GLUCOSE, CAPILLARY: Glucose-Capillary: 149 mg/dL — ABNORMAL HIGH (ref 70–99)

## 2013-02-23 LAB — BASIC METABOLIC PANEL
BUN: 22 mg/dL (ref 6–23)
CO2: 29 mEq/L (ref 19–32)
Calcium: 8.9 mg/dL (ref 8.4–10.5)
GFR calc non Af Amer: >90 mL/min (ref >90)
Glucose, Bld: 160 mg/dL — ABNORMAL HIGH (ref 70–99)
Sodium: 137 mEq/L (ref 135–145)

## 2013-02-23 MED ORDER — OXYCODONE HCL 5 MG PO TABS
10.0000 mg | ORAL_TABLET | ORAL | Status: DC | PRN
  Administered 2013-02-23 – 2013-02-25 (×7): 10 mg via ORAL
  Filled 2013-02-23 (×7): qty 2

## 2013-02-23 MED ORDER — ALPRAZOLAM 0.5 MG PO TABS
0.5000 mg | ORAL_TABLET | Freq: Two times a day (BID) | ORAL | Status: DC | PRN
  Administered 2013-02-24: 0.5 mg via ORAL
  Filled 2013-02-23 (×2): qty 1

## 2013-02-23 NOTE — Procedures (Signed)
Successful placement of dual lumen power injectable PICC line to left brachial vein. Length 45cm Tip at lower SVC/RA No complications Ready for use.  Pattricia Boss PA-C Interventional Radiology  02/23/13  4:06 PM

## 2013-02-23 NOTE — Progress Notes (Signed)
[facility] PROGRESS NOTE  Kourosh Jablonsky NFA:[id number] DOB: 10-Nov-1955 DOA: 02/17/2013 PCP: [name], DO  Assessment/Plan: Principal Problem:   Ascites/anasarca: Continue Lasix plus Spironolactone plus IV albumin. CT scan confirms portal hypertension, but no signs of any obstructive mass. Cause looks to be secondary to third spacing given poor albumin levels from nutrition plus liver disease.  Increasing Lasix dose like to worsening renal function. Decreased dose and renal function better. Continue IV albumin and monitor output. Place PICC line for IV access and then we'll plan to discharge to home versus short-term skilled nursing with continued Lasix and albumin.  Active Problems:   Hepatic encephalopathy: Currently stable. Watching ammonia levels, continue rifaximin and lactulose.   Somnolence: Not from ammonia. 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 Stable.  Coagulopathy:Will start vitamin K daily    Hypothyroidism: Continue Synthroid    Diabetes mellitus type 2, uncontrolled, with complications: On Lantus plus sliding scale, CBG is below 200  Severe protein-calorie malnutrition: In the context of chronic illness as evidenced by severe fat depletion and severe fluid accumulation. Appreciate nutrition.  Added Ensure & MVI.  Code Status: Partial code  Family Communication: Left message with daughter   Disposition Plan: Continue diuresis as much as possible   Consultants:  [place] [facility]  Nutrition  Procedures:  PICC line placement for today  Antibiotics:  None  HPI/Subjective: Patient very somnolent. Very difficult to wake.  Objective: Filed Vitals:   02/23/13 0600  BP:  110/68  Pulse: 82  Temp: 97.4 F (36.3 C)  Resp: 18    Intake/Output Summary (Last 24 hours) at 02/23/13 1422 Last data filed at 02/23/13 1127  Gross per 24 hour  Intake    800 ml  Output   4100 ml  Net  -3300 ml   Filed Weights   02/21/13 0500 02/22/13 0500 02/23/13 0600  Weight: 133.584 kg (294 lb 8 oz) 133 kg (293 lb 3.4 oz) 133.4 kg (294 lb 1.5 oz)    Exam:   General:  Somnolent  Cardiovascular: Regular rate and rhythm, S1-S2  Respiratory: Mild bilateral wheezing  Abdomen: Soft, obese, nontender, positive bowel sounds  Musculoskeletal: No clubbing or cyanosis, signs of chronic venous stasis of his lower extremities, 3+ pitting edema from the knees down as well scrotal edema , mild improvement  Data Reviewed: Basic Metabolic Panel:  Recent Labs Lab 02/18/13 0234 02/19/13 0525 02/21/13 0620 02/22/13 0630 02/23/13 0603  NA 132* 133* 134* 134* 137  K 3.7 3.7 4.7 4.7 5.0  CL 98 97 99 101 101  CO2 29 29 31 29 29   GLUCOSE 147* 176* 194* 166* 160*  BUN 12 11 21  24* 22  CREATININE 0.80 0.78 1.40* 1.17 0.88  CALCIUM 8.1* 8.1* 8.3* 8.4 8.9   Liver Function Tests:  Recent Labs Lab 02/18/13 0234 02/19/13 0525  AST 46* 51*  ALT 21 22  ALKPHOS 155* 158*  BILITOT 2.3* 2.3*  PROT 6.0 6.1  ALBUMIN 1.9* 2.2*   No results found for this basename: LIPASE, AMYLASE,  in the last 168 hours  Recent Labs Lab 02/19/13 0525 02/22/13 2027  AMMONIA 128* 62*   CBC:  Recent Labs Lab 02/18/13 0234  WBC 3.8*  NEUTROABS 2.1  HGB  9.3*  HCT 27.6*  MCV 103.0*  PLT 48*   Cardiac Enzymes: No results found for this basename: CKTOTAL, CKMB, CKMBINDEX, TROPONINI,  in the last 168 hours BNP (last 3 results)  Recent Labs  12/03/12 1435 01/11/13 2055 02/18/13 0234  PROBNP 297.7* 207.4* 985.8*   CBG:  Recent Labs Lab 02/22/13 2126 02/22/13 2357 02/23/13 0445 02/23/13 0743 02/23/13 1214  GLUCAP 187* 183* 138* 118* 149*    No results found for this or any  previous visit (from the past 240 hour(s)).   Studies: No results found.  Scheduled Meds: . albumin human  25 g Intravenous Q6H  . feeding supplement (ENSURE COMPLETE)  237 mL Oral BID BM  . ferrous sulfate  325 mg Oral QHS  . folic acid  1 mg Oral q morning - 10a  . furosemide  80 mg Oral BID  . insulin glargine  12 Units Subcutaneous QHS  . lactulose  30 g Oral 5 X Daily  . levETIRAcetam  500 mg Oral BID  . levothyroxine  75 mcg Oral q morning - 10a  . multivitamin with minerals  1 tablet Oral Daily  . nadolol  40 mg Oral q morning - 10a  . pantoprazole  40 mg Oral Daily  . PARoxetine  20 mg Oral Daily  . phytonadione  2.5 mg Oral Daily  . potassium chloride SA  20 mEq Oral q morning - 10a  . rifaximin  550 mg Oral BID  . risperiDONE  0.25 mg Oral BID  . spironolactone  100 mg Oral BID   Continuous Infusions:   Principal Problem:   Ascites Active Problems:   Hepatic encephalopathy   ETOH abuse   Hepatitis C   Cirrhosis   Seizure disorder   Thrombocytopenia, acquired   Hypothyroidism   Diabetes mellitus type 2, uncontrolled, with complications   Severe protein-calorie malnutrition    Time spent: 20 min    Hollice Espy  [facility] Pager [phone]. If 7PM-7AM, please contact night-coverage at [email or web address], password [facility] 02/23/2013, 2:22 PM  LOS: 6 days

## 2013-02-24 LAB — GLUCOSE, CAPILLARY
Glucose-Capillary: 123 mg/dL — ABNORMAL HIGH (ref 70–99)
Glucose-Capillary: 137 mg/dL — ABNORMAL HIGH (ref 70–99)
Glucose-Capillary: 137 mg/dL — ABNORMAL HIGH (ref 70–99)
Glucose-Capillary: 180 mg/dL — ABNORMAL HIGH (ref 70–99)
Glucose-Capillary: 204 mg/dL — ABNORMAL HIGH (ref 70–99)

## 2013-02-24 NOTE — Progress Notes (Signed)
[facility] PROGRESS NOTE  Tony Boyd ZOX:[id number] DOB: 05/04/1955 DOA: 02/17/2013 PCP: [name], DO  Assessment/Plan: Principal Problem:   Ascites/anasarca: Continue Lasix plus Spironolactone plus IV albumin. CT scan confirms portal hypertension, but no signs of any obstructive mass. Cause looks to be secondary to third spacing given poor albumin levels from nutrition plus liver disease.  Increasing Lasix dose like to worsening renal function. Decreased dose and renal function better. Continue IV albumin and monitor output. Patient has diuresed over 8 L. PlacedPICC line for IV access and then we'll plan to discharge to home tomorrow.  Active Problems:   Hepatic encephalopathy: Currently stable. Watching ammonia levels, continue rifaximin and lactulose.   Somnolence: Not from ammonia. 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 Stable.  Coagulopathy:Will start vitamin K daily    Hypothyroidism: Continue Synthroid    Diabetes mellitus type 2, uncontrolled, with complications: On Lantus plus sliding scale, CBG is below 200  Severe protein-calorie malnutrition: In the context of chronic illness as evidenced by severe fat depletion and severe fluid accumulation. Appreciate nutrition.  Added Ensure & MVI.  Code Status: Partial code  Family Communication: Left message with daughter earlier in the week Disposition Plan: Home tomorrow Consultants:   gastroenterology  Nutrition  Procedures:  PICC line placement done 12/29  Antibiotics:  None  HPI/Subjective: Patient more awake. Feeling better. Breathing comfortably.  Objective: Filed Vitals:   02/24/13  1416  BP: 144/74  Pulse: 78  Temp: 98 F (36.7 C)  Resp: 20    Intake/Output Summary (Last 24 hours) at 02/24/13 1730 Last data filed at 02/24/13 1146  Gross per 24 hour  Intake    300 ml  Output   2325 ml  Net  -2025 ml   Filed Weights   02/22/13 0500 02/23/13 0600 02/24/13 0530  Weight: 133 kg (293 lb 3.4 oz) 133.4 kg (294 lb 1.5 oz) 129.366 kg (285 lb 3.2 oz)    Exam:   General:  Awake and alert, in no acute distress  Cardiovascular: Regular rate and rhythm, S1-S2  Respiratory: Mild bilateral wheezing  Abdomen: Soft, obese, nontender, positive bowel sounds  Musculoskeletal: No clubbing or cyanosis, signs of chronic venous stasis of his lower extremities, 3+ pitting edema from the knees down as well scrotal edema , mild improvement  Data Reviewed: Basic Metabolic Panel:  Recent Labs Lab 02/18/13 0234 02/19/13 0525 02/21/13 0620 02/22/13 0630 02/23/13 0603  NA 132* 133* 134* 134* 137  K 3.7 3.7 4.7 4.7 5.0  CL 98 97 99 101 101  CO2 29 29 31 29 29   GLUCOSE 147* 176* 194* 166* 160*  BUN 12 11 21  24* 22  CREATININE 0.80 0.78 1.40* 1.17 0.88  CALCIUM 8.1* 8.1* 8.3* 8.4 8.9   Liver Function Tests:  Recent Labs Lab 02/18/13 0234 02/19/13 0525  AST 46* 51*  ALT 21 22  ALKPHOS 155* 158*  BILITOT 2.3* 2.3*  PROT 6.0 6.1  ALBUMIN 1.9* 2.2*   No results found for this basename: LIPASE, AMYLASE,  in the last 168 hours  Recent Labs Lab 02/19/13 0525 02/22/13 2027  AMMONIA 128* 62*   CBC:  Recent Labs Lab 02/18/13 0234  WBC  3.8*  NEUTROABS 2.1  HGB 9.3*  HCT 27.6*  MCV 103.0*  PLT 48*   Cardiac Enzymes: No results found for this basename: CKTOTAL, CKMB, CKMBINDEX, TROPONINI,  in the last 168 hours BNP (last 3 results)  Recent Labs  12/03/12 1435 01/11/13 2055 02/18/13 0234  PROBNP 297.7* 207.4* 985.8*   CBG:  Recent Labs Lab 02/24/13 0007 02/24/13 0401 02/24/13 0746 02/24/13 1129 02/24/13 1641  GLUCAP 180* 137* 137* 123* 237*     No results found for this or any previous visit (from the past 240 hour(s)).   Studies: Ir Fluoro Guide Cv Line Right  02/23/2013   CLINICAL DATA:  Poor venous access, need PICC line for IV albumin.  EXAM: Left dual lumen power injectable PICC LINE PLACEMENT WITH ULTRASOUND AND FLUOROSCOPIC GUIDANCE  FLUOROSCOPY TIME:  54 seconds  PROCEDURE: The patient was advised of the possible risks and complications and agreed to undergo the procedure. The patient was then brought to the angiographic suite for the procedure.  The left arm was prepped with chlorhexidine, draped in the usual sterile fashion using maximum barrier technique (cap and mask, sterile gown, sterile gloves, large sterile sheet, hand hygiene and cutaneous antisepsis) and infiltrated locally with 1% Lidocaine.  Ultrasound demonstrated patency of the left brachial vein, and this was documented with an image. Under real-time ultrasound guidance, this vein was accessed with a 21 gauge micropuncture needle and image documentation was performed. A 0.018 wire was introduced in to the vein. Over this, a 5 [place] dual lumen power-injectable PICC was advanced to the lower SVC/right atrial junction. Fluoroscopy during the procedure and fluoro spot radiograph confirms appropriate catheter position. The catheter was flushed and covered with a sterile dressing.  Complications: none  IMPRESSION: Successful left arm Power injectable PICC line placement with ultrasound and fluoroscopic guidance. The catheter is ready for use.  Read By:  Pattricia Boss PA-C   Electronically Signed   By: Richarda Overlie M.D.   On: 02/23/2013 16:11   Ir US Guide Vasc Access Left  02/23/2013   CLINICAL DATA:  Poor venous access, need PICC line for IV albumin.  EXAM: Left dual lumen power injectable PICC LINE PLACEMENT WITH ULTRASOUND AND FLUOROSCOPIC GUIDANCE  FLUOROSCOPY TIME:  54 seconds  PROCEDURE: The patient was advised of the possible risks and complications and agreed to  undergo the procedure. The patient was then brought to the angiographic suite for the procedure.  The left arm was prepped with chlorhexidine, draped in the usual sterile fashion using maximum barrier technique (cap and mask, sterile gown, sterile gloves, large sterile sheet, hand hygiene and cutaneous antisepsis) and infiltrated locally with 1% Lidocaine.  Ultrasound demonstrated patency of the left brachial vein, and this was documented with an image. Under real-time ultrasound guidance, this vein was accessed with a 21 gauge micropuncture needle and image documentation was performed. A 0.018 wire was introduced in to the vein. Over this, a 5 [place] dual lumen power-injectable PICC was advanced to the lower SVC/right atrial junction. Fluoroscopy during the procedure and fluoro spot radiograph confirms appropriate catheter position. The catheter was flushed and covered with a sterile dressing.  Complications: none  IMPRESSION: Successful left arm Power injectable PICC line placement with ultrasound and fluoroscopic guidance. The catheter is ready for use.  Read By:  Pattricia Boss PA-C   Electronically Signed   By: Richarda Overlie M.D.   On: 02/23/2013 16:11    Scheduled Meds: . albumin human  25 g Intravenous Q6H  .  feeding supplement (ENSURE COMPLETE)  237 mL Oral BID BM  . ferrous sulfate  325 mg Oral QHS  . folic acid  1 mg Oral q morning - 10a  . furosemide  80 mg Oral BID  . insulin glargine  12 Units Subcutaneous QHS  . lactulose  30 g Oral 5 X Daily  . levETIRAcetam  500 mg Oral BID  . levothyroxine  75 mcg Oral q morning - 10a  . multivitamin with minerals  1 tablet Oral Daily  . nadolol  40 mg Oral q morning - 10a  . pantoprazole  40 mg Oral Daily  . PARoxetine  20 mg Oral Daily  . phytonadione  2.5 mg Oral Daily  . potassium chloride SA  20 mEq Oral q morning - 10a  . rifaximin  550 mg Oral BID  . risperiDONE  0.25 mg Oral BID  . spironolactone  100 mg Oral BID   Continuous Infusions:    Principal Problem:   Ascites Active Problems:   Hepatic encephalopathy   ETOH abuse   Hepatitis C   Cirrhosis   Seizure disorder   Thrombocytopenia, acquired   Hypothyroidism   Diabetes mellitus type 2, uncontrolled, with complications   Severe protein-calorie malnutrition    Time spent: 20 min    Hollice Espy  [facility] Pager [phone]. If 7PM-7AM, please contact night-coverage at [email or web address], password [facility] 02/24/2013, 5:30 PM  LOS: 7 days

## 2013-02-24 NOTE — Progress Notes (Signed)
Physical Therapy Treatment Patient Details Name: Tony Boyd MRN: [id number] DOB: [birth year] Today's Date: 02/24/2013 Time: 1191-4782 PT Time Calculation (min): 15 min  PT Assessment / Plan / Recommendation  History of Present Illness Pt is an 57 y.o. male with hx of bipolar disorder, hep C, alcoholic cirrhosis, frequent admission for hepatic encephalopathy, hx of seizure, coagulopathy, who was recommended SNF, or ALF, but he was deemed competent by psychiatry, and had refused placement in the past.  He was again found unresponsive at home on the floor reportedly by family, and EMS had brought him here.  There were no reported of witnessed seizure, tongue biting, but in the ER, he was progressively become more alert, and anwered a few questions. Evalaution in the ER included a normal NH3 level, normal renal fx tests along with normal WBC, and unremarkable LFTs.  His head CT and CXR showed no acute processes.  Hospitalist was asked to admit him for altered mental status   PT Comments   Progressing slowly with mobility. Overall pt was close guard assist for mobility.   Follow Up Recommendations  Home health PT;SNF (depending on progress)     Does the patient have the potential to tolerate intense rehabilitation     Barriers to Discharge        Equipment Recommendations  None recommended by PT    Recommendations for Other Services OT consult  Frequency Min 3X/week   Progress towards PT Goals Progress towards PT goals: Progressing toward goals (slowly)  Plan Current plan remains appropriate    Precautions / Restrictions Precautions Precautions: Fall Restrictions Weight Bearing Restrictions: No   Pertinent Vitals/Pain 8/10 back-chronic    Mobility  Bed Mobility Bed Mobility: Supine to Sit;Sit to Supine Supine to Sit: 4: Min guard;HOB elevated;With rails Sit to Supine: 4: Min guard;HOB elevated;With rail Details for Bed Mobility Assistance: Heavy reliance on rails. Increased time.   Transfers Transfers: Sit to Stand;Stand to Sit Sit to Stand: From elevated surface;4: Min guard;From toilet;From bed Stand to Sit: 4: Min guard;To toilet Details for Transfer Assistance: x2. VCs safety, hand placement.  Ambulation/Gait Ambulation/Gait Assistance: 4: Min guard Ambulation Distance (Feet): 100 Feet Assistive device: Rolling walker Ambulation/Gait Assistance Details: slow gait speed. VCs safety, distance from walker.  Gait Pattern: Wide base of support;Decreased step length - left;Decreased step length - right;Step-through pattern    Exercises     PT Diagnosis:    PT Problem List:   PT Treatment Interventions:     PT Goals (current goals can now be found in the care plan section)    Visit Information  Last PT Received On: 02/24/13 Assistance Needed: +1 History of Present Illness: Pt is an 57 y.o. male with hx of bipolar disorder, hep C, alcoholic cirrhosis, frequent admission for hepatic encephalopathy, hx of seizure, coagulopathy, who was recommended SNF, or ALF, but he was deemed competent by psychiatry, and had refused placement in the past.  He was again found unresponsive at home on the floor reportedly by family, and EMS had brought him here.  There were no reported of witnessed seizure, tongue biting, but in the ER, he was progressively become more alert, and anwered a few questions. Evalaution in the ER included a normal NH3 level, normal renal fx tests along with normal WBC, and unremarkable LFTs.  His head CT and CXR showed no acute processes.  Hospitalist was asked to admit him for altered mental status    Subjective Data      Cognition  Cognition Arousal/Alertness: Awake/alert Behavior During Therapy: WFL for tasks assessed/performed Overall Cognitive Status: Within Functional Limits for tasks assessed    Balance     End of Session PT - End of Session Activity Tolerance: Patient tolerated treatment well Patient left: in bed;with call bell/phone within  reach   GP     Rebeca Alert, MPT Pager: [phone]

## 2013-02-25 DIAGNOSIS — B1920 Unspecified viral hepatitis C without hepatic coma: Secondary | ICD-10-CM

## 2013-02-25 LAB — GLUCOSE, CAPILLARY
Glucose-Capillary: 119 mg/dL — ABNORMAL HIGH (ref 70–99)
Glucose-Capillary: 127 mg/dL — ABNORMAL HIGH (ref 70–99)

## 2013-02-25 MED ORDER — PHYTONADIONE 5 MG PO TABS: 2.5000 mg | ORAL_TABLET | Freq: Every day | ORAL | Status: DC

## 2013-02-25 MED ORDER — ALPRAZOLAM 0.5 MG PO TABS: 0.5000 mg | ORAL_TABLET | Freq: Two times a day (BID) | ORAL | Status: DC | PRN

## 2013-02-25 MED ORDER — ADULT MULTIVITAMIN W/MINERALS CH: 1.0000 | ORAL_TABLET | Freq: Every day | ORAL | Status: DC

## 2013-02-25 MED ORDER — ENSURE COMPLETE PO LIQD: 237.0000 mL | Freq: Two times a day (BID) | ORAL | Status: DC

## 2013-02-25 NOTE — Progress Notes (Signed)
Inpatient Diabetes Program Recommendations  AACE/ADA: New Consensus Statement on Inpatient Glycemic Control (2013)  Target Ranges:  Prepandial:   less than 140 mg/dL      Peak postprandial:   less than 180 mg/dL (1-2 hours)      Critically ill patients:  140 - 180 mg/dL     Results for Tony Boyd, Tony Boyd (MRN [id number]) as of 02/25/2013 10:05  Ref. Range 02/24/2013 00:07 02/24/2013 04:01 02/24/2013 07:46 02/24/2013 11:29 02/24/2013 16:41 02/24/2013 20:14  Glucose-Capillary Latest Range: 70-99 mg/dL 409 (H) 811 (H) 914 (H) 123 (H) 237 (H) 204 (H)    **Patient with history of Type 2 DM.  Currently on home dose of Lantus 12 units QHS.   **Patient had a few elevated CBGs yesterday evening.  If this trend continues, please consider adding Novolog Sensitive SSI tid ac + HS   Will follow. Ambrose Finland RN, MSN, CDE Diabetes Coordinator Inpatient Diabetes Program Team Pager: [phone] (8a-10p)

## 2013-02-25 NOTE — Progress Notes (Signed)
Discharge instructions given to pt/dtr, verbalized understanding. Left the unit in stable condition. 

## 2013-02-25 NOTE — Discharge Summary (Signed)
Physician Discharge Summary  Tony Boyd ZOX:[id number] DOB: [birth year] DOA: 02/17/2013  PCP: [name], DO  Admit date: 02/17/2013 Discharge date: 02/25/2013  Time spent: 25 minutes  Recommendations for Outpatient Follow-up:  1. Recommend patient change Xanax and OxyContin to when necessary 2. Added Ensure twice a day 3. New medications: Vitamin K daily to decrease fracture of coagulopathy 4. Recommend PCP follow albumin levels with occasional infusions of IV albumin for better Lasix effect  Discharge Diagnoses:  Principal Problem:   Anasarca Active Problems:   Hepatic encephalopathy   ETOH abuse   Hepatitis C   Cirrhosis   Seizure disorder   Thrombocytopenia, acquired   Hypothyroidism   Diabetes mellitus type 2, uncontrolled, with complications   Severe protein-calorie malnutrition Coagulopathy  Discharge Condition: Significantly improved, being discharged to home  Diet recommendation: Low-sodium, carb modified  Filed Weights   02/23/13 0600 02/24/13 0530 02/25/13 0541  Weight: 133.4 kg (294 lb 1.5 oz) 129.366 kg (285 lb 3.2 oz) 128.595 kg (283 lb 8 oz)    History of present illness:  On 12/24: Patient is a 57 year old with history of end-stage liver disease and secondary cirrhosis secondary to alcohol abuse and hepatitis C who is frequently admitted for hepatic encephalopathy and presented with abdominal and scrotal swelling which have become much worse over the past few days to the point where patient was feeling short of breath and unable to ambulate. Patient had never had severe enough ascites to require a paracentesis prior to this admission. He was admitted to the hospital service and paracentesis was ordered.  Hospital Course:  Principal Problem:   Anasarca: [facility] who is followed patient in the past evaluated him. Scan done actually noted minimal ascites. In discussion with gastroenterology, it was felt that patient's edema was more from  third spacing of fluid secondary to severe protein deficiency brought on by liver disease. Patient's albumin on admission was noted to be 1.9 Patient was started on IV albumin and Lasix which was normal done at 60 by mouth twice daily, was increased to IV Lasix 60 every 12 hours. Initially patient had moderate output, but as albumin stores started to increase, patient started having significant diuresis and by 12/31, had diuresed over 10 L and was 11 pounds lighter. Patient does point was symptomatically better and starting to ambulate. Am recommending that he continue on Ensure twice a day and continue his home dose of Lasix which should continue some degree of diuresis. Recommending patient's PCP follow his albumin levels and at times infuse IV albumin which should give her much better affected his Lasix   Active Problems:   Hepatic encephalopathy: During this hospitalization, was not an issue. Patient's ammonia level peaked as high as 100, but he was continued on rifaximin and lactulose. He was never symptomatic from this. Note: There were days when patient was noted to be more somnolent. Once his pain and anxiety medications were changed from scheduled to when necessary, he was much more awake and alert.    ETOH abuse   Hepatitis C: Followed by GI   Cirrhosis: See above    Seizure disorder: Continue on Keppra. Stable medical issue    Thrombocytopenia, acquired: Secondary liver disease. Stable.    Hypothyroidism: Continue on Synthroid.    Diabetes mellitus type 2, uncontrolled, with complications: Continue on Lantus.    Severe protein-calorie malnutrition: Pt meets criteria for severe MALNUTRITION in the context of chronic illness as evidenced by severe fat depletion and severe fluid accumulation.  Dietitian recommended multivitamin and Ensure twice a day  Coagulopathy: Patient noted to have elevated INR despite not being on anticoagulation. He was started on daily vitamin  K. Procedures:  PICC line placed 12/29  Consultations:  [facility]  Nutrition  Discharge Exam: Filed Vitals:   02/25/13 0541  BP: 131/67  Pulse: 74  Temp: 98 F (36.7 C)  Resp: 20    General: Alert oriented x3, no acute distress Cardiovascular: Regular rate and rhythm, S1-S2 Respiratory: Clear to auscultation bilaterally Abdomen: Soft, obese, nontender, positive bowel sounds Extremities: No clubbing or cyanosis, 1-2+ pitting edema, much improved  Discharge Instructions  Discharge Orders   Future Appointments Provider Department Dept Phone   03/05/2013 2:15 PM Claudie Revering, NP [facility] [phone]   Future Orders Complete By Expires   Diet - low sodium heart healthy  As directed    Increase activity slowly  As directed        Medication List    STOP taking these medications       potassium chloride SA 20 MEQ tablet  Commonly known as:  K-DUR,KLOR-CON      TAKE these medications       albuterol (5 MG/ML) 0.5% nebulizer solution  Commonly known as:  PROVENTIL  Take 0.5 mLs (2.5 mg total) by nebulization every 4 (four) hours as needed for wheezing or shortness of breath.     ALPRAZolam 0.5 MG tablet  Commonly known as:  XANAX  Take 1 tablet (0.5 mg total) by mouth 2 (two) times daily as needed for anxiety.     alum & mag hydroxide-simeth 200-200-20 MG/5ML suspension  Commonly known as:  MAALOX/MYLANTA  Take 30 mLs by mouth every 6 (six) hours as needed.     feeding supplement (ENSURE COMPLETE) Liqd  Take 237 mLs by mouth 2 (two) times daily between meals.     ferrous sulfate 325 (65 FE) MG tablet  Take 325 mg by mouth at bedtime.     folic acid 1 MG tablet  Commonly known as:  FOLVITE  Take 1 mg by mouth every morning.     furosemide 40 MG tablet  Commonly known as:  LASIX  TAKE 1 & 1/2 TABLET BY MOUTH TWICE DAILY     insulin glargine 100 UNIT/ML injection  Commonly known as:  LANTUS  Inject 0.12 mLs (12 Units total)  into the skin at bedtime.     lactulose 10 GM/15ML solution  Commonly known as:  CHRONULAC  Take 45 mLs (30 g total) by mouth 5 (five) times daily.     levETIRAcetam 500 MG tablet  Commonly known as:  KEPPRA  Take 1 tablet (500 mg total) by mouth 2 (two) times daily.     levothyroxine 75 MCG tablet  Commonly known as:  SYNTHROID, LEVOTHROID  Take 75 mcg by mouth every morning.     multivitamin with minerals Tabs tablet  Take 1 tablet by mouth daily.     nadolol 40 MG tablet  Commonly known as:  CORGARD  Take 1 tablet (40 mg total) by mouth every morning.     omeprazole 20 MG capsule  Commonly known as:  PRILOSEC  Take 20 mg by mouth every morning.     ondansetron 4 MG tablet  Commonly known as:  ZOFRAN  Take 1 tablet (4 mg total) by mouth every 6 (six) hours as needed for nausea.     Oxycodone HCl 10 MG Tabs  Take 1 tablet (10 mg  total) by mouth every 4 (four) hours.     PARoxetine 20 MG tablet  Commonly known as:  PAXIL  TAKE 1 TABLET BY MOUTH EVERY DAY     phytonadione 5 MG tablet  Commonly known as:  VITAMIN K  Take 0.5 tablets (2.5 mg total) by mouth daily.     rifaximin 550 MG Tabs tablet  Commonly known as:  XIFAXAN  Take 1 tablet (550 mg total) by mouth 2 (two) times daily.     risperiDONE 0.25 MG tablet  Commonly known as:  RISPERDAL  Take 0.25 mg by mouth 2 (two) times daily.     spironolactone 100 MG tablet  Commonly known as:  ALDACTONE  Take 1 tablet (100 mg total) by mouth every morning.       Allergies  Allergen Reactions  . Ativan [Lorazepam] Other (See Comments)    "hallucinations"  . Droperidol Other (See Comments)    unknown  . Penicillins Swelling  . Toradol [Ketorolac Tromethamine] Hives       Follow-up Information   Follow up with REED, TIFFANY, DO In 2 weeks.   Specialty:  Geriatric Medicine   Contact information:   [address] [place] [address] [phone]        The results of significant diagnostics from this  hospitalization (including imaging, microbiology, ancillary and laboratory) are listed below for reference.    Significant Diagnostic Studies: Dg Chest 2 View  02/18/2013   CLINICAL DATA:  Shortness of breath and groin swelling.  EXAM: CHEST  2 VIEW  COMPARISON:  Chest radiograph performed 01/11/2013  FINDINGS: The lungs are relatively well-aerated. Mild vascular congestion is noted, without significant pulmonary edema. Mild bilateral atelectasis is seen. There is no evidence of pleural effusion or pneumothorax.  The heart is borderline enlarged. No acute osseous abnormalities are seen.  IMPRESSION: Mild vascular congestion and borderline cardiomegaly; mild bilateral atelectasis seen.   Electronically Signed   By: Roanna Raider M.D.   On: 02/18/2013 00:52   Ct Abdomen Pelvis W Contrast  02/18/2013   CLINICAL DATA:  Abdominal pain.  Massive anasarca.  EXAM: CT ABDOMEN AND PELVIS WITH CONTRAST  TECHNIQUE: Multidetector CT imaging of the abdomen and pelvis was performed using the standard protocol following bolus administration of intravenous contrast.  CONTRAST:  OMNIPAQUE IOHEXOL 300 MG/ML  SOLN  COMPARISON:  CT 07/18/2010.  FINDINGS: Lung Bases: Dependent atelectasis.  Liver: Severe hepatic cirrhosis. Liver is small. Tiny area of low attenuation in the medial right hepatic lobe probably represents an area of scarring and is unchanged compared to 5/20 03/2010. There is no focal discrete mass lesion identified.  Spleen:  Splenomegaly.  Gallbladder:  Surgically absent.  Common bile duct:  Grossly normal.  Pancreas:  Normal.  Adrenal glands:  Normal.  Kidneys: Unchanged left upper pole renal cyst. Renal enhancement is within normal limits. Tiny right upper pole cystic lesion likely represents a simple cyst. The ureters grossly appear within normal limits.  Stomach: Gastric varices are present. Stomach is distended with food.  Small bowel: No small bowel inflammation. Edematous mesenteric is present,  probably secondary to hepatic cirrhosis and hypoproteinemia.  Colon: Colonic diverticulosis. Oral contrast is present within the colon. No inflammatory changes. Normal appendix.  Pelvic Genitourinary: Foley catheter. Collapsed urinary bladder. Minimal free fluid in the anatomic pelvis.  Bones: Ankylosis of the SI joints. L3 compression fractures present which is new compared to the prior CT of 07/18/2010. Mild retropulsion. No aggressive osseous lesions. Loss  of vertebral body height is about 50%.  Vasculature: Portal venous hypertension is present. Numerous collaterals are present in the upper abdomen. There is no recanalization of the periumbilical vein however there are all Large amount portal venous collaterals that extends through the anterior abdominal wall into the subcutaneous fat including around the umbilicus. No significant retroperitoneal collaterals are identified. Atherosclerosis without an acute vascular abnormality.  Body Wall: Anasarca. Varices are present in the subcutaneous fat associated with portal venous hypertension.  IMPRESSION: 1. Severe hepatic cirrhosis. 2. Portal venous hypertension with varices extending from the abdomen into the abdominal subcutaneous fat. 3. Anasarca and mild ascites. 4. Splenomegaly. 5. Renal cysts. 6. New L3 compression fracture with 50% loss of vertebral body height. No paravertebral hematoma. Although new compared to 2012, this has features suggesting chronicity.   Electronically Signed   By: Andreas Newport M.D.   On: 02/18/2013 23:02   US Abdomen Limited  02/18/2013   CLINICAL DATA:  Ascites  EXAM: LIMITED ABDOMEN ULTRASOUND FOR ASCITES  TECHNIQUE: Limited ultrasound survey for ascites was performed in all four abdominal quadrants.  COMPARISON:  12/04/2012  FINDINGS: Ultrasound was performed to assess for ascites for possible paracentesis. There is a very small amount of free fluid noted in the right lower quadrant, not sufficient to tap at this time.   IMPRESSION: Small amount of right lower quadrant ascites, not sufficient to tap.   Electronically Signed   By: Charlett Nose M.D.   On: 02/18/2013 09:23   Ir Fluoro Guide Cv Line Right  02/23/2013   CLINICAL DATA:  Poor venous access, need PICC line for IV albumin.  EXAM: Left dual lumen power injectable PICC LINE PLACEMENT WITH ULTRASOUND AND FLUOROSCOPIC GUIDANCE  FLUOROSCOPY TIME:  54 seconds  PROCEDURE: The patient was advised of the possible risks and complications and agreed to undergo the procedure. The patient was then brought to the angiographic suite for the procedure.  The left arm was prepped with chlorhexidine, draped in the usual sterile fashion using maximum barrier technique (cap and mask, sterile gown, sterile gloves, large sterile sheet, hand hygiene and cutaneous antisepsis) and infiltrated locally with 1% Lidocaine.  Ultrasound demonstrated patency of the left brachial vein, and this was documented with an image. Under real-time ultrasound guidance, this vein was accessed with a 21 gauge micropuncture needle and image documentation was performed. A 0.018 wire was introduced in to the vein. Over this, a 5 [place] dual lumen power-injectable PICC was advanced to the lower SVC/right atrial junction. Fluoroscopy during the procedure and fluoro spot radiograph confirms appropriate catheter position. The catheter was flushed and covered with a sterile dressing.  Complications: none  IMPRESSION: Successful left arm Power injectable PICC line placement with ultrasound and fluoroscopic guidance. The catheter is ready for use.  Read By:  Pattricia Boss PA-C   Electronically Signed   By: Richarda Overlie M.D.   On: 02/23/2013 16:11   Ir US Guide Vasc Access Left  02/23/2013   CLINICAL DATA:  Poor venous access, need PICC line for IV albumin.  EXAM: Left dual lumen power injectable PICC LINE PLACEMENT WITH ULTRASOUND AND FLUOROSCOPIC GUIDANCE  FLUOROSCOPY TIME:  54 seconds  PROCEDURE: The patient was advised  of the possible risks and complications and agreed to undergo the procedure. The patient was then brought to the angiographic suite for the procedure.  The left arm was prepped with chlorhexidine, draped in the usual sterile fashion using maximum barrier technique (cap and mask, sterile gown, sterile  gloves, large sterile sheet, hand hygiene and cutaneous antisepsis) and infiltrated locally with 1% Lidocaine.  Ultrasound demonstrated patency of the left brachial vein, and this was documented with an image. Under real-time ultrasound guidance, this vein was accessed with a 21 gauge micropuncture needle and image documentation was performed. A 0.018 wire was introduced in to the vein. Over this, a 5 [place] dual lumen power-injectable PICC was advanced to the lower SVC/right atrial junction. Fluoroscopy during the procedure and fluoro spot radiograph confirms appropriate catheter position. The catheter was flushed and covered with a sterile dressing.  Complications: none  IMPRESSION: Successful left arm Power injectable PICC line placement with ultrasound and fluoroscopic guidance. The catheter is ready for use.  Read By:  Pattricia Boss PA-C   Electronically Signed   By: Richarda Overlie M.D.   On: 02/23/2013 16:11    Microbiology: No results found for this or any previous visit (from the past 240 hour(s)).   Labs: Basic Metabolic Panel:  Recent Labs Lab 02/19/13 0525 02/21/13 0620 02/22/13 0630 02/23/13 0603  NA 133* 134* 134* 137  K 3.7 4.7 4.7 5.0  CL 97 99 101 101  CO2 29 31 29 29   GLUCOSE 176* 194* 166* 160*  BUN 11 21 24* 22  CREATININE 0.78 1.40* 1.17 0.88  CALCIUM 8.1* 8.3* 8.4 8.9   Liver Function Tests:  Recent Labs Lab 02/19/13 0525  AST 51*  ALT 22  ALKPHOS 158*  BILITOT 2.3*  PROT 6.1  ALBUMIN 2.2*   No results found for this basename: LIPASE, AMYLASE,  in the last 168 hours  Recent Labs Lab 02/19/13 0525 02/22/13 2027  AMMONIA 128* 62*   BNP: BNP (last 3  results)  Recent Labs  12/03/12 1435 01/11/13 2055 02/18/13 0234  PROBNP 297.7* 207.4* 985.8*   CBG:  Recent Labs Lab 02/24/13 1641 02/24/13 2014 02/25/13 0002 02/25/13 0406 02/25/13 0750  GLUCAP 237* 204* 160* 128* 119*       Signed:  Virginia Rochester K  [facility] 02/25/2013, 11:39 AM

## 2013-03-02 ENCOUNTER — Other Ambulatory Visit: Payer: Self-pay | Admitting: Internal Medicine

## 2013-03-05 ENCOUNTER — Ambulatory Visit (INDEPENDENT_AMBULATORY_CARE_PROVIDER_SITE_OTHER): Payer: Medicare Other | Admitting: Nurse Practitioner

## 2013-03-05 ENCOUNTER — Encounter: Payer: Self-pay | Admitting: Nurse Practitioner

## 2013-03-05 VITALS — BP 118/62 | HR 57 | Temp 97.7°F | Resp 10 | Wt 266.4 lb

## 2013-03-05 DIAGNOSIS — E1165 Type 2 diabetes mellitus with hyperglycemia: Secondary | ICD-10-CM

## 2013-03-05 DIAGNOSIS — R609 Edema, unspecified: Secondary | ICD-10-CM

## 2013-03-05 DIAGNOSIS — E118 Type 2 diabetes mellitus with unspecified complications: Secondary | ICD-10-CM

## 2013-03-05 DIAGNOSIS — IMO0002 Reserved for concepts with insufficient information to code with codable children: Secondary | ICD-10-CM

## 2013-03-05 DIAGNOSIS — K7460 Unspecified cirrhosis of liver: Secondary | ICD-10-CM

## 2013-03-05 DIAGNOSIS — E43 Unspecified severe protein-calorie malnutrition: Secondary | ICD-10-CM

## 2013-03-05 DIAGNOSIS — N5089 Other specified disorders of the male genital organs: Secondary | ICD-10-CM

## 2013-03-05 NOTE — Patient Instructions (Signed)
Increase lasix to 80 BID for 10 days  Will get labs today Please make sure he is taking his lactulose over the next week to ensure ammonia level stays down  Please look into assisted livings- these are better than the hospital :)   6 week follow up

## 2013-03-05 NOTE — Progress Notes (Signed)
Patient ID: Tony Boyd, male   DOB: Aug 08, 1955, 57 y.o.   MRN: [id number]    Allergies  Allergen Reactions  . Ativan [Lorazepam] Other (See Comments)    "hallucinations"  . Droperidol Other (See Comments)    unknown  . Penicillins Swelling  . Toradol [Ketorolac Tromethamine] Hives    Chief Complaint  Patient presents with  . Medical Managment of Chronic Issues    3 week follow-up on medication changes and recent hospital visit     HPI: Patient is a 58 y.o.  male seen in the office today for follow up on hospitalization; pt with history of end-stage liver disease and secondary cirrhosis secondary to alcohol abuse and hepatitis C who has been frequently admitted for hepatic encephalopathy and was recently taken to the hospital with abdominal and scrotal swelling; Pt was admitted due to anasarca and requiring IV albumin and IV lasix. Now is at the office following up.   Here with daughter; daughter reports he is at baseline mentally but something is off  She has a symptom in place to ensure that he takes his lactulose as prescribed  Has been taking ensure once daily, daughter has attempted to provide a well balanced diet but pt does not always eat like he is supposed to.   Review of Systems:  Review of Systems  Constitutional: Negative for fever and chills.  Respiratory: Negative for cough and shortness of breath.        Shortness of breath with exertion but this has improved  Cardiovascular: Positive for leg swelling. Negative for chest pain and palpitations.  Gastrointestinal: Positive for diarrhea (and freq). Negative for heartburn, nausea (comes and goes; not new), vomiting, abdominal pain and constipation.  Genitourinary: Negative for dysuria, urgency and frequency.  Musculoskeletal: Positive for joint pain (no changes in pain).  Skin: Negative.   Neurological: Positive for weakness (but overall this has improved). Negative for headaches.  Psychiatric/Behavioral: Positive for  memory loss. Negative for depression. The patient is not nervous/anxious.      Past Medical History  Diagnosis Date  . Cirrhosis of liver   . Hepatitis C   . ETOH abuse   . Hypothyroid   . Psychosis   . Bipolar disorder   . Seizures   . Arthritis   . Back pain   . Coagulopathy     Hx of  . Thrombocytopenia     Hx of  . Pancytopenia     Hx of  . H/O hypokalemia   . Hyponatremia     Hx of  . Korsakoff psychosis   . H/O abdominal abscess   . H/O esophageal varices   . Metabolic encephalopathy   . Hepatic encephalopathy   . Urinary urgency   . H/O renal failure   . H/O ascites   . Altered mental status   . Anemia   . Ascites   . Shortness of breath   . Peripheral vascular disease   . GERD (gastroesophageal reflux disease)   . Thrombocytopenia, acquired 06/02/2012  . Unspecified hypothyroidism 06/02/2012  . Pain in joint, pelvic region and thigh   . Edema   . Obesity, unspecified   . Chest pain, unspecified   . Pneumonitis due to inhalation of food or vomitus   . Other convulsions   . Cellulitis and abscess of upper arm and forearm   . Chronic hepatitis C without mention of hepatic coma   . Hypopotassemia   . Anemia, unspecified   . Other and  unspecified coagulation defects   . Thrombocytopenia, unspecified   . Bipolar I disorder, most recent episode (or current) unspecified   . Unspecified psychosis   . Encephalopathy   . Esophageal varices without mention of bleeding   . Esophagitis, unspecified   . Abscess of liver(572.0)   . Chronic kidney disease, unspecified   . Lumbago   . Personal history of alcoholism   . Personal history of tobacco use, presenting hazards to health   . Cirrhosis of liver without mention of alcohol   . Pneumonitis due to inhalation of food or vomitus 09/19/2010  . Chronic hepatitis C without mention of hepatic coma   . Bipolar I disorder, most recent episode (or current) unspecified   . Unspecified psychosis   . Other ascites   .  Personal history of tobacco use, presenting hazards to health   . Cirrhosis of liver without mention of alcohol   . Type 2 diabetes mellitus with diabetic neuropathy    Past Surgical History  Procedure Laterality Date  . Colostomy    . Cholecystectomy    . Small intestine surgery    . Arm surgery      left arm  . US guided drain placement in ventral hernia abscess  07/21/2010  . Multiple picc line placements    . Esophagogastroduodenoscopy  06/28/2011    Procedure: ESOPHAGOGASTRODUODENOSCOPY (EGD);  Surgeon: Inda Castle, MD;  Location: [facility];  Service: Endoscopy;  Laterality: N/A;  . Esophagogastroduodenoscopy N/A 05/06/2012    Procedure: ESOPHAGOGASTRODUODENOSCOPY (EGD);  Surgeon: Inda Castle, MD;  Location: [facility];  Service: Endoscopy;  Laterality: N/A;  . Esophageal banding N/A 05/06/2012    Procedure: ESOPHAGEAL BANDING;  Surgeon: Inda Castle, MD;  Location: [facility];  Service: Endoscopy;  Laterality: N/A;   Social History:   reports that he quit smoking about 5 years ago. His smoking use included Cigarettes. He smoked 0.00 packs per day. He has never used smokeless tobacco. He reports that he does not drink alcohol or use illicit drugs.  Family History  Problem Relation Age of Onset  . Heart disease Mother     MI  . Lung cancer Brother     Medications: Patient's Medications  New Prescriptions   No medications on file  Previous Medications   ALBUTEROL (PROVENTIL) (5 MG/ML) 0.5% NEBULIZER SOLUTION    Take 0.5 mLs (2.5 mg total) by nebulization every 4 (four) hours as needed for wheezing or shortness of breath.   ALPRAZOLAM (XANAX) 0.5 MG TABLET    Take 1 tablet (0.5 mg total) by mouth 2 (two) times daily as needed for anxiety.   ALUM & MAG HYDROXIDE-SIMETH (MAALOX/MYLANTA) 200-200-20 MG/5ML SUSPENSION    Take 30 mLs by mouth every 6 (six) hours as needed.   FEEDING SUPPLEMENT, ENSURE COMPLETE, (ENSURE COMPLETE) LIQD    Take 237 mLs by mouth 2 (two)  times daily between meals.   FERROUS SULFATE 325 (65 FE) MG TABLET    Take 325 mg by mouth at bedtime.   FOLIC ACID (FOLVITE) 1 MG TABLET    Take 1 mg by mouth every morning.    FUROSEMIDE (LASIX) 80 MG TABLET    Take 80 mg by mouth 2 (two) times daily.   INSULIN GLARGINE (LANTUS) 100 UNIT/ML INJECTION    Inject 0.12 mLs (12 Units total) into the skin at bedtime.   LEVETIRACETAM (KEPPRA) 500 MG TABLET    Take 1 tablet (500 mg total) by mouth 2 (two) times daily.  LEVOTHYROXINE (SYNTHROID, LEVOTHROID) 75 MCG TABLET    Take 75 mcg by mouth every morning.    MULTIPLE VITAMIN (MULTIVITAMIN WITH MINERALS) TABS TABLET    Take 1 tablet by mouth daily.   NADOLOL (CORGARD) 40 MG TABLET    Take 1 tablet (40 mg total) by mouth every morning.   OMEPRAZOLE (PRILOSEC) 20 MG CAPSULE    Take 20 mg by mouth every morning.    ONDANSETRON (ZOFRAN) 4 MG TABLET    Take 1 tablet (4 mg total) by mouth every 6 (six) hours as needed for nausea.   OXYCODONE HCL 10 MG TABS    Take 1 tablet (10 mg total) by mouth every 4 (four) hours.   PAROXETINE (PAXIL) 20 MG TABLET    TAKE 1 TABLET BY MOUTH EVERY DAY   PHYTONADIONE (VITAMIN K) 5 MG TABLET    Take 0.5 tablets (2.5 mg total) by mouth daily.   RIFAXIMIN (XIFAXAN) 550 MG TABS TABLET    Take 1 tablet (550 mg total) by mouth 2 (two) times daily.   RISPERIDONE (RISPERDAL) 0.25 MG TABLET    Take 0.25 mg by mouth 2 (two) times daily.   SPIRONOLACTONE (ALDACTONE) 100 MG TABLET    Take 1 tablet (100 mg total) by mouth every morning.  Modified Medications   Modified Medication Previous Medication   LACTULOSE (CHRONULAC) 10 GM/15ML SOLUTION lactulose (CHRONULAC) 10 GM/15ML solution      Take 30 g by mouth 6 (six) times daily.    Take 45 mLs (30 g total) by mouth 5 (five) times daily.  Discontinued Medications   FUROSEMIDE (LASIX) 40 MG TABLET    TAKE 1 & 1/2 TABLET BY MOUTH TWICE DAILY     Physical Exam:  Filed Vitals:   03/05/13 1429  BP: 118/62  Pulse: 57  Temp: 97.7  F (36.5 C)  TempSrc: Oral  Resp: 10  Weight: 266 lb 6.4 oz (120.838 kg)  SpO2: 95%    Physical Exam  Constitutional: No distress.  Cardiovascular: Normal rate, regular rhythm and normal heart sounds.   Pulmonary/Chest: Effort normal and breath sounds normal. No respiratory distress.  Abdominal: Soft. Bowel sounds are normal. He exhibits distension (but soft).  Musculoskeletal: He exhibits edema (into thigh BUT has improved since last visit). He exhibits no tenderness.  Neurological: He is alert. Gait normal.  Skin: Skin is warm and dry. He is not diaphoretic.  jaundice   Psychiatric: Affect normal.     Labs reviewed: Basic Metabolic Panel:  Recent Labs  08/12/12 1135  09/17/12 0146  11/12/12 0235  12/10/12 0525  12/11/12 0515  12/28/12 1630  02/21/13 0620 02/22/13 0630 02/23/13 0603  NA  --   < > 120*  < > 127*  < > 127*  < > 124*  < >  --   < > 134* 134* 137  K  --   < > 4.0  < > 3.3*  < > 3.5  --  3.4*  < >  --   < > 4.7 4.7 5.0  CL  --   < > 89*  < > 89*  < > 95*  --  92*  < >  --   < > 99 101 101  CO2  --   < > 23  < > 31  < > 29  --  28  < >  --   < > 31 29 29   GLUCOSE  --   < > 155*  < >  157*  < > 116*  --  98  < >  --   < > 194* 166* 160*  BUN  --   < > 13  < > 12  < > 10  --  10  < >  --   < > 21 24* 22  CREATININE  --   < > 0.79  < > 0.75  < > 0.65  --  0.70  < >  --   < > 1.40* 1.17 0.88  CALCIUM  --   < > 8.5  < > 8.2*  < > 8.3*  --  8.2*  < >  --   < > 8.3* 8.4 8.9  MG  --   < >  --   < >  --   < > 1.7  --  1.5  --  1.6  --   --   --   --   TSH 2.323  --  2.243  --  3.470  --   --   --   --   --   --   --   --   --   --   < > = values in this interval not displayed. Liver Function Tests:  Recent Labs  01/11/13 2054 02/11/13 1100 02/18/13 0234 02/19/13 0525  AST 66* 46* 46* 51*  ALT 28 16 21 22   ALKPHOS 144* 152* 155* 158*  BILITOT 3.2* 2.4* 2.3* 2.3*  PROT 6.6 5.8* 6.0 6.1  ALBUMIN 2.3*  --  1.9* 2.2*    Recent Labs  09/30/12 0454  10/06/12 1240 12/28/12 1551  LIPASE 47 33 49    Recent Labs  02/11/13 1100 02/19/13 0525 02/22/13 2027  AMMONIA 181* 128* 62*   CBC:  Recent Labs  01/08/13 1150 01/11/13 1920 02/18/13 0234  WBC 3.6 4.7 3.8*  NEUTROABS 2.6 3.5 2.1  HGB 10.6* 10.9* 9.3*  HCT 31.0* 32.0* 27.6*  MCV 102* 100.9* 103.0*  PLT 46* 75* 48*   Lipid Panel: No results found for this basename: CHOL, HDL, LDLCALC, TRIG, CHOLHDL, LDLDIRECT,  in the last 8760 hours TSH:  Recent Labs  08/12/12 1135 09/17/12 0146 11/12/12 0235  TSH 2.323 2.243 3.470   A1C: No components found with this basename: A1C,    Assessment/Plan 1. Cirrhosis -again education provided on importance of medication compliance -setting alarms reminders; having someone call pt to ensure pt takes medications -daughter admits he will frequently forget doses  - Ammonia  2. Severe protein-calorie malnutrition -increase protein and healthy snacks -to take ensures twice daily - will follow up CMP and Prealbumin - Comprehensive metabolic panel  3. Diabetes mellitus type 2, uncontrolled, with complications -conts on lantus 12 units at bedtime  4. Scrotal swelling -improved  5. Edema -to cont lasix 80 BID for 10 days then to decrease to 60 mg BID  Lengthy discussion with pt and daughter on how assisted living would be a good environment for pt and they would be able to provide medication management and help with needs; daughter is to look into some assisted livings; they had a bad experience in the past   To follow up in 4-6 weeks

## 2013-03-06 ENCOUNTER — Encounter: Payer: Self-pay | Admitting: Internal Medicine

## 2013-03-07 LAB — PREALBUMIN: Prealbumin: <4 mg/dL — ABNORMAL LOW (ref 20–40)

## 2013-03-13 ENCOUNTER — Encounter: Payer: Self-pay | Admitting: Internal Medicine

## 2013-03-16 ENCOUNTER — Other Ambulatory Visit: Payer: Medicare Other

## 2013-03-19 ENCOUNTER — Other Ambulatory Visit: Payer: Self-pay | Admitting: Internal Medicine

## 2013-03-19 ENCOUNTER — Other Ambulatory Visit: Payer: Self-pay | Admitting: *Deleted

## 2013-03-19 MED ORDER — OXYCODONE HCL 10 MG PO TABS: 10.0000 mg | ORAL_TABLET | ORAL | Status: DC

## 2013-03-23 ENCOUNTER — Emergency Department (HOSPITAL_COMMUNITY): Payer: Medicare Other

## 2013-03-23 ENCOUNTER — Inpatient Hospital Stay (HOSPITAL_COMMUNITY)
Admission: EM | Admit: 2013-03-23 | Discharge: 2013-04-02 | DRG: 441 | Disposition: A | Discharge status: Home Health | Payer: Medicare Other | Attending: Internal Medicine | Admitting: Internal Medicine

## 2013-03-23 ENCOUNTER — Encounter (HOSPITAL_COMMUNITY): Payer: Self-pay | Admitting: Emergency Medicine

## 2013-03-23 DIAGNOSIS — Z9119 Patient's noncompliance with other medical treatment and regimen: Secondary | ICD-10-CM

## 2013-03-23 DIAGNOSIS — IMO0002 Reserved for concepts with insufficient information to code with codable children: Secondary | ICD-10-CM

## 2013-03-23 DIAGNOSIS — R4182 Altered mental status, unspecified: Secondary | ICD-10-CM

## 2013-03-23 DIAGNOSIS — N189 Chronic kidney disease, unspecified: Secondary | ICD-10-CM | POA: Diagnosis present

## 2013-03-23 DIAGNOSIS — I8500 Esophageal varices without bleeding: Secondary | ICD-10-CM

## 2013-03-23 DIAGNOSIS — Z91199 Patient's noncompliance with other medical treatment and regimen due to unspecified reason: Secondary | ICD-10-CM

## 2013-03-23 DIAGNOSIS — E1165 Type 2 diabetes mellitus with hyperglycemia: Secondary | ICD-10-CM | POA: Diagnosis present

## 2013-03-23 DIAGNOSIS — I8510 Secondary esophageal varices without bleeding: Secondary | ICD-10-CM | POA: Diagnosis present

## 2013-03-23 DIAGNOSIS — G8929 Other chronic pain: Secondary | ICD-10-CM | POA: Diagnosis present

## 2013-03-23 DIAGNOSIS — Z87891 Personal history of nicotine dependence: Secondary | ICD-10-CM

## 2013-03-23 DIAGNOSIS — R531 Weakness: Secondary | ICD-10-CM

## 2013-03-23 DIAGNOSIS — R7402 Elevation of levels of lactic acid dehydrogenase (LDH): Secondary | ICD-10-CM | POA: Diagnosis present

## 2013-03-23 DIAGNOSIS — D638 Anemia in other chronic diseases classified elsewhere: Secondary | ICD-10-CM

## 2013-03-23 DIAGNOSIS — E1149 Type 2 diabetes mellitus with other diabetic neurological complication: Secondary | ICD-10-CM | POA: Diagnosis present

## 2013-03-23 DIAGNOSIS — K7460 Unspecified cirrhosis of liver: Secondary | ICD-10-CM

## 2013-03-23 DIAGNOSIS — F1021 Alcohol dependence, in remission: Secondary | ICD-10-CM

## 2013-03-23 DIAGNOSIS — Z794 Long term (current) use of insulin: Secondary | ICD-10-CM

## 2013-03-23 DIAGNOSIS — K219 Gastro-esophageal reflux disease without esophagitis: Secondary | ICD-10-CM | POA: Diagnosis present

## 2013-03-23 DIAGNOSIS — I739 Peripheral vascular disease, unspecified: Secondary | ICD-10-CM | POA: Diagnosis present

## 2013-03-23 DIAGNOSIS — Z888 Allergy status to other drugs, medicaments and biological substances status: Secondary | ICD-10-CM

## 2013-03-23 DIAGNOSIS — B1920 Unspecified viral hepatitis C without hepatic coma: Secondary | ICD-10-CM

## 2013-03-23 DIAGNOSIS — F04 Amnestic disorder due to known physiological condition: Secondary | ICD-10-CM | POA: Diagnosis present

## 2013-03-23 DIAGNOSIS — E039 Hypothyroidism, unspecified: Secondary | ICD-10-CM

## 2013-03-23 DIAGNOSIS — K7290 Hepatic failure, unspecified without coma: Principal | ICD-10-CM | POA: Diagnosis present

## 2013-03-23 DIAGNOSIS — R601 Generalized edema: Secondary | ICD-10-CM

## 2013-03-23 DIAGNOSIS — Z79899 Other long term (current) drug therapy: Secondary | ICD-10-CM

## 2013-03-23 DIAGNOSIS — Z880 Allergy status to penicillin: Secondary | ICD-10-CM

## 2013-03-23 DIAGNOSIS — E1142 Type 2 diabetes mellitus with diabetic polyneuropathy: Secondary | ICD-10-CM | POA: Diagnosis present

## 2013-03-23 DIAGNOSIS — E669 Obesity, unspecified: Secondary | ICD-10-CM | POA: Diagnosis present

## 2013-03-23 DIAGNOSIS — R740 Nonspecific elevation of levels of transaminase and lactic acid dehydrogenase [LDH]: Secondary | ICD-10-CM

## 2013-03-23 DIAGNOSIS — F1010 Alcohol abuse, uncomplicated: Secondary | ICD-10-CM

## 2013-03-23 DIAGNOSIS — N5089 Other specified disorders of the male genital organs: Secondary | ICD-10-CM

## 2013-03-23 DIAGNOSIS — Z6833 Body mass index (BMI) 33.0-33.9, adult: Secondary | ICD-10-CM

## 2013-03-23 DIAGNOSIS — K7682 Hepatic encephalopathy: Principal | ICD-10-CM

## 2013-03-23 DIAGNOSIS — E118 Type 2 diabetes mellitus with unspecified complications: Secondary | ICD-10-CM

## 2013-03-23 DIAGNOSIS — E43 Unspecified severe protein-calorie malnutrition: Secondary | ICD-10-CM

## 2013-03-23 DIAGNOSIS — G40909 Epilepsy, unspecified, not intractable, without status epilepticus: Secondary | ICD-10-CM

## 2013-03-23 DIAGNOSIS — R188 Other ascites: Secondary | ICD-10-CM

## 2013-03-23 DIAGNOSIS — R7401 Elevation of levels of liver transaminase levels: Secondary | ICD-10-CM | POA: Diagnosis present

## 2013-03-23 DIAGNOSIS — Z933 Colostomy status: Secondary | ICD-10-CM

## 2013-03-23 DIAGNOSIS — D696 Thrombocytopenia, unspecified: Secondary | ICD-10-CM

## 2013-03-23 DIAGNOSIS — F068 Other specified mental disorders due to known physiological condition: Secondary | ICD-10-CM

## 2013-03-23 DIAGNOSIS — W19XXXA Unspecified fall, initial encounter: Secondary | ICD-10-CM

## 2013-03-23 DIAGNOSIS — M129 Arthropathy, unspecified: Secondary | ICD-10-CM | POA: Diagnosis present

## 2013-03-23 DIAGNOSIS — F319 Bipolar disorder, unspecified: Secondary | ICD-10-CM | POA: Diagnosis present

## 2013-03-23 LAB — GLUCOSE, CAPILLARY
GLUCOSE-CAPILLARY: 177 mg/dL — AB (ref 70–99)
GLUCOSE-CAPILLARY: 123 mg/dL — AB (ref 70–99)
Glucose-Capillary: 114 mg/dL — ABNORMAL HIGH (ref 70–99)

## 2013-03-23 LAB — COMPREHENSIVE METABOLIC PANEL
ALT: 18 U/L (ref 0–53)
AST: 51 U/L — AB (ref 0–37)
Albumin: 2.9 g/dL — ABNORMAL LOW (ref 3.5–5.2)
Alkaline Phosphatase: 154 U/L — ABNORMAL HIGH (ref 39–117)
BUN: 23 mg/dL (ref 6–23)
CO2: 27 meq/L (ref 19–32)
CREATININE: 1.01 mg/dL (ref 0.50–1.35)
Calcium: 8.6 mg/dL (ref 8.4–10.5)
Chloride: 95 mEq/L — ABNORMAL LOW (ref 96–112)
GFR, EST NON AFRICAN AMERICAN: 81 mL/min — AB (ref >90)
GLUCOSE: 104 mg/dL — AB (ref 70–99)
Potassium: 4 mEq/L (ref 3.7–5.3)
Sodium: 136 mEq/L — ABNORMAL LOW (ref 137–147)
Total Bilirubin: 4.3 mg/dL — ABNORMAL HIGH (ref 0.3–1.2)
Total Protein: 7.2 g/dL (ref 6.0–8.3)

## 2013-03-23 LAB — MRSA PCR SCREENING: MRSA by PCR: NEGATIVE

## 2013-03-23 LAB — URINALYSIS, ROUTINE W REFLEX MICROSCOPIC
Bilirubin Urine: NEGATIVE
Glucose, UA: NEGATIVE mg/dL
Hgb urine dipstick: NEGATIVE
KETONES UR: NEGATIVE mg/dL
LEUKOCYTES UA: NEGATIVE
Nitrite: NEGATIVE
PROTEIN: NEGATIVE mg/dL
Specific Gravity, Urine: 1.01 (ref 1.005–1.030)
UROBILINOGEN UA: 2 mg/dL — AB (ref 0.0–1.0)
pH: 7 (ref 5.0–8.0)

## 2013-03-23 LAB — CBC
HEMATOCRIT: 32.1 % — AB (ref 39.0–52.0)
Hemoglobin: 10.8 g/dL — ABNORMAL LOW (ref 13.0–17.0)
MCH: 35 pg — AB (ref 26.0–34.0)
MCHC: 33.6 g/dL (ref 30.0–36.0)
MCV: 103.9 fL — ABNORMAL HIGH (ref 78.0–100.0)
PLATELETS: 35 10*3/uL — AB (ref 150–400)
RBC: 3.09 MIL/uL — AB (ref 4.22–5.81)
RDW: 15.8 % — ABNORMAL HIGH (ref 11.5–15.5)
WBC: 3.9 10*3/uL — ABNORMAL LOW (ref 4.0–10.5)

## 2013-03-23 LAB — AMMONIA: Ammonia: 75 umol/L — ABNORMAL HIGH (ref 11–60)

## 2013-03-23 MED ORDER — LACTULOSE 10 GM/15ML PO SOLN
45.0000 g | Freq: Every day | ORAL | Status: DC
  Administered 2013-03-23 – 2013-03-26 (×13): 45 g via ORAL
  Filled 2013-03-23 (×23): qty 90

## 2013-03-23 MED ORDER — ADULT MULTIVITAMIN W/MINERALS CH
1.0000 | ORAL_TABLET | Freq: Every morning | ORAL | Status: DC
  Administered 2013-03-23 – 2013-03-24 (×2): 1 via ORAL
  Filled 2013-03-23 (×2): qty 1

## 2013-03-23 MED ORDER — LACTULOSE 10 GM/15ML PO SOLN
10.0000 g | Freq: Once | ORAL | Status: AC
  Administered 2013-03-23: 10 g via ORAL
  Filled 2013-03-23: qty 15

## 2013-03-23 MED ORDER — OXYCODONE HCL 5 MG PO TABS
10.0000 mg | ORAL_TABLET | Freq: Every day | ORAL | Status: DC
  Administered 2013-03-23 – 2013-03-25 (×10): 10 mg via ORAL
  Filled 2013-03-23 (×10): qty 2

## 2013-03-23 MED ORDER — FERROUS SULFATE 325 (65 FE) MG PO TABS
325.0000 mg | ORAL_TABLET | Freq: Every day | ORAL | Status: DC
  Administered 2013-03-23 – 2013-04-01 (×10): 325 mg via ORAL
  Filled 2013-03-23 (×11): qty 1

## 2013-03-23 MED ORDER — FUROSEMIDE 40 MG PO TABS
40.0000 mg | ORAL_TABLET | Freq: Two times a day (BID) | ORAL | Status: DC
  Administered 2013-03-23 – 2013-04-02 (×19): 40 mg via ORAL
  Filled 2013-03-23 (×23): qty 1

## 2013-03-23 MED ORDER — INSULIN ASPART 100 UNIT/ML ~~LOC~~ SOLN
3.0000 [IU] | Freq: Three times a day (TID) | SUBCUTANEOUS | Status: DC
  Administered 2013-03-23 – 2013-04-02 (×21): 3 [IU] via SUBCUTANEOUS

## 2013-03-23 MED ORDER — NADOLOL 40 MG PO TABS
40.0000 mg | ORAL_TABLET | Freq: Every morning | ORAL | Status: DC
Start: 2013-03-23 — End: 2013-04-02
  Administered 2013-03-23 – 2013-04-02 (×11): 40 mg via ORAL
  Filled 2013-03-23 (×11): qty 1

## 2013-03-23 MED ORDER — SODIUM CHLORIDE 0.9 % IV BOLUS (SEPSIS)
1000.0000 mL | Freq: Once | INTRAVENOUS | Status: AC
  Administered 2013-03-23: 1000 mL via INTRAVENOUS

## 2013-03-23 MED ORDER — FOLIC ACID 1 MG PO TABS
1.0000 mg | ORAL_TABLET | Freq: Every morning | ORAL | Status: DC
  Administered 2013-03-23 – 2013-03-24 (×2): 1 mg via ORAL
  Filled 2013-03-23 (×2): qty 1

## 2013-03-23 MED ORDER — INSULIN ASPART 100 UNIT/ML ~~LOC~~ SOLN
0.0000 [IU] | Freq: Three times a day (TID) | SUBCUTANEOUS | Status: DC
  Administered 2013-03-23: 2 [IU] via SUBCUTANEOUS
  Administered 2013-03-24: 1 [IU] via SUBCUTANEOUS
  Administered 2013-03-24: 2 [IU] via SUBCUTANEOUS
  Administered 2013-03-25 – 2013-03-29 (×8): 1 [IU] via SUBCUTANEOUS
  Administered 2013-03-30: 2 [IU] via SUBCUTANEOUS
  Administered 2013-03-30 – 2013-04-01 (×3): 1 [IU] via SUBCUTANEOUS

## 2013-03-23 MED ORDER — LEVETIRACETAM 500 MG PO TABS
500.0000 mg | ORAL_TABLET | Freq: Two times a day (BID) | ORAL | Status: DC
  Administered 2013-03-23 – 2013-04-02 (×21): 500 mg via ORAL
  Filled 2013-03-23 (×22): qty 1

## 2013-03-23 MED ORDER — ALPRAZOLAM 0.5 MG PO TABS
0.5000 mg | ORAL_TABLET | Freq: Two times a day (BID) | ORAL | Status: DC | PRN
  Administered 2013-03-23: 0.5 mg via ORAL
  Filled 2013-03-23: qty 1

## 2013-03-23 MED ORDER — LEVOTHYROXINE SODIUM 75 MCG PO TABS
75.0000 ug | ORAL_TABLET | Freq: Every morning | ORAL | Status: DC
  Administered 2013-03-23 – 2013-04-02 (×11): 75 ug via ORAL
  Filled 2013-03-23 (×11): qty 1

## 2013-03-23 MED ORDER — PANTOPRAZOLE SODIUM 40 MG PO TBEC
40.0000 mg | DELAYED_RELEASE_TABLET | Freq: Every day | ORAL | Status: DC
  Administered 2013-03-23 – 2013-04-02 (×11): 40 mg via ORAL
  Filled 2013-03-23 (×11): qty 1

## 2013-03-23 MED ORDER — SPIRONOLACTONE 100 MG PO TABS
200.0000 mg | ORAL_TABLET | Freq: Every morning | ORAL | Status: DC
  Administered 2013-03-23 – 2013-04-02 (×11): 200 mg via ORAL
  Filled 2013-03-23 (×11): qty 2

## 2013-03-23 MED ORDER — INSULIN ASPART 100 UNIT/ML ~~LOC~~ SOLN
0.0000 [IU] | Freq: Every day | SUBCUTANEOUS | Status: DC
  Administered 2013-03-28: 2 [IU] via SUBCUTANEOUS

## 2013-03-23 MED ORDER — INSULIN GLARGINE 100 UNIT/ML ~~LOC~~ SOLN
12.0000 [IU] | Freq: Every day | SUBCUTANEOUS | Status: DC
  Administered 2013-03-23 – 2013-04-01 (×10): 12 [IU] via SUBCUTANEOUS
  Filled 2013-03-23 (×12): qty 0.12

## 2013-03-23 MED ORDER — RIFAXIMIN 550 MG PO TABS
550.0000 mg | ORAL_TABLET | Freq: Two times a day (BID) | ORAL | Status: DC
  Administered 2013-03-23 – 2013-03-24 (×3): 550 mg via ORAL
  Filled 2013-03-23 (×4): qty 1

## 2013-03-23 MED ORDER — POTASSIUM CHLORIDE ER 10 MEQ PO TBCR
10.0000 meq | EXTENDED_RELEASE_TABLET | Freq: Every morning | ORAL | Status: DC
  Administered 2013-03-23 – 2013-04-02 (×11): 10 meq via ORAL
  Filled 2013-03-23 (×11): qty 1

## 2013-03-23 MED ORDER — RISPERIDONE 0.25 MG PO TABS
0.2500 mg | ORAL_TABLET | Freq: Two times a day (BID) | ORAL | Status: DC
  Administered 2013-03-23 – 2013-03-24 (×3): 0.25 mg via ORAL
  Filled 2013-03-23 (×4): qty 1

## 2013-03-23 NOTE — ED Notes (Signed)
Pt is lethargic, oriented to person and place. Pt states he is light headed. Denies pain. Appears jaundiced. Pt goes in and out of being able to follow commands.

## 2013-03-23 NOTE — Progress Notes (Signed)
Patient received from ED via stretcher.  He appears very confused, able to relay some information but shows confusion with other information.  His IV infusion is completed site saline locked.  Applied SCD with explanation of reason, oriented to room, phone, call light, oxygen at 2L , patient bed alarm placed.  Patient voided in urinal.  MRSA swab collected and sent to lab.  Lunch ordered

## 2013-03-23 NOTE — Progress Notes (Signed)
UR completed  ED CM spoke with attending MD, Doyle Askew Orders entered in [facility]

## 2013-03-23 NOTE — ED Provider Notes (Signed)
CSN: [id number]     Arrival date & time 03/23/13  0036 History   First MD Initiated Contact with Patient 03/23/13 0139     Chief Complaint  Patient presents with  . Altered Mental Status   (Consider location/radiation/quality/duration/timing/severity/associated sxs/prior Treatment) HPI History per EMS and patient. Resides at independent living facility and daughter called EMS tonight for altered mental status, reportedly not taking lactulose as prescribed with history of hepatic encephalopathy in the past. Patient denies any fall or head injury. He denies any chest pain or shortness of breath. No abdominal pain or nausea vomiting. Patient unable to say when his last bowel movement was. He follows commands and answers basic questions appropriately. He knows who the president is, but is unable to tell me when he last took his lactulose.   Past Medical History  Diagnosis Date  . Cirrhosis of liver   . Hepatitis C   . ETOH abuse   . Hypothyroid   . Psychosis   . Bipolar disorder   . Seizures   . Arthritis   . Back pain   . Coagulopathy     Hx of  . Thrombocytopenia     Hx of  . Pancytopenia     Hx of  . H/O hypokalemia   . Hyponatremia     Hx of  . Korsakoff psychosis   . H/O abdominal abscess   . H/O esophageal varices   . Metabolic encephalopathy   . Hepatic encephalopathy   . Urinary urgency   . H/O renal failure   . H/O ascites   . Altered mental status   . Anemia   . Ascites   . Shortness of breath   . Peripheral vascular disease   . GERD (gastroesophageal reflux disease)   . Thrombocytopenia, acquired 06/02/2012  . Unspecified hypothyroidism 06/02/2012  . Pain in joint, pelvic region and thigh   . Edema   . Obesity, unspecified   . Chest pain, unspecified   . Pneumonitis due to inhalation of food or vomitus   . Other convulsions   . Cellulitis and abscess of upper arm and forearm   . Chronic hepatitis C without mention of hepatic coma   . Hypopotassemia   .  Anemia, unspecified   . Other and unspecified coagulation defects   . Thrombocytopenia, unspecified   . Bipolar I disorder, most recent episode (or current) unspecified   . Unspecified psychosis   . Encephalopathy   . Esophageal varices without mention of bleeding   . Esophagitis, unspecified   . Abscess of liver(572.0)   . Chronic kidney disease, unspecified   . Lumbago   . Personal history of alcoholism   . Personal history of tobacco use, presenting hazards to health   . Cirrhosis of liver without mention of alcohol   . Pneumonitis due to inhalation of food or vomitus 09/19/2010  . Chronic hepatitis C without mention of hepatic coma   . Bipolar I disorder, most recent episode (or current) unspecified   . Unspecified psychosis   . Other ascites   . Personal history of tobacco use, presenting hazards to health   . Cirrhosis of liver without mention of alcohol   . Type 2 diabetes mellitus with diabetic neuropathy    Past Surgical History  Procedure Laterality Date  . Colostomy    . Cholecystectomy    . Small intestine surgery    . Arm surgery      left arm  . US guided drain placement  in ventral hernia abscess  07/21/2010  . Multiple picc line placements    . Esophagogastroduodenoscopy  06/28/2011    Procedure: ESOPHAGOGASTRODUODENOSCOPY (EGD);  Surgeon: Inda Castle, MD;  Location: [facility];  Service: Endoscopy;  Laterality: N/A;  . Esophagogastroduodenoscopy N/A 05/06/2012    Procedure: ESOPHAGOGASTRODUODENOSCOPY (EGD);  Surgeon: Inda Castle, MD;  Location: [facility];  Service: Endoscopy;  Laterality: N/A;  . Esophageal banding N/A 05/06/2012    Procedure: ESOPHAGEAL BANDING;  Surgeon: Inda Castle, MD;  Location: [facility];  Service: Endoscopy;  Laterality: N/A;   Family History  Problem Relation Age of Onset  . Heart disease Mother     MI  . Lung cancer Brother    History  Substance Use Topics  . Smoking status: Former Smoker    Types: Cigarettes     Quit date: 02/15/2008  . Smokeless tobacco: Never Used  . Alcohol Use: No     Comment: Quit drinking 1 year ago.      Review of Systems  Constitutional: Negative for fever and chills.  Eyes: Negative for visual disturbance.  Respiratory: Negative for shortness of breath.   Cardiovascular: Negative for chest pain.  Gastrointestinal: Negative for vomiting and abdominal pain.  Genitourinary: Negative for dysuria.  Musculoskeletal: Negative for back pain, neck pain and neck stiffness.  Skin: Negative for rash.  Neurological: Negative for syncope and headaches.  All other systems reviewed and are negative.    Allergies  Ativan; Droperidol; Penicillins; and Toradol  Home Medications   Current Outpatient Rx  Name  Route  Sig  Dispense  Refill  . ALPRAZolam (XANAX) 0.5 MG tablet   Oral   Take 1 tablet (0.5 mg total) by mouth 2 (two) times daily as needed for anxiety.   30 tablet   0   . ferrous sulfate 325 (65 FE) MG tablet   Oral   Take 325 mg by mouth at bedtime.         . folic acid (FOLVITE) 1 MG tablet   Oral   Take 1 mg by mouth every morning.         . furosemide (LASIX) 40 MG tablet   Oral   Take 40 mg by mouth 2 (two) times daily.         . insulin glargine (LANTUS) 100 UNIT/ML injection   Subcutaneous   Inject 0.12 mLs (12 Units total) into the skin at bedtime.   10 mL   3   . lactulose (CHRONULAC) 10 GM/15ML solution   Oral   Take 45 g by mouth 5 (five) times daily.          [name] levETIRAcetam (KEPPRA) 500 MG tablet   Oral   Take 1 tablet (500 mg total) by mouth 2 (two) times daily.   180 tablet   1   . levothyroxine (SYNTHROID, LEVOTHROID) 75 MCG tablet   Oral   Take 75 mcg by mouth every morning.          . Multiple Vitamin (MULTIVITAMIN WITH MINERALS) TABS tablet   Oral   Take 1 tablet by mouth every morning.         . nadolol (CORGARD) 40 MG tablet   Oral   Take 1 tablet (40 mg total) by mouth every morning.   90 tablet   1    . omeprazole (PRILOSEC) 20 MG capsule   Oral   Take 20 mg by mouth every morning.          [name]  oxyCODONE (OXY IR/ROXICODONE) 5 MG immediate release tablet   Oral   Take 10 mg by mouth 5 (five) times daily.         . potassium chloride (K-DUR) 10 MEQ tablet   Oral   Take 10 mEq by mouth every morning.         . rifaximin (XIFAXAN) 550 MG TABS tablet   Oral   Take 550 mg by mouth 2 (two) times daily.         . risperiDONE (RISPERDAL) 0.25 MG tablet   Oral   Take 0.25 mg by mouth 2 (two) times daily.         [name] spironolactone (ALDACTONE) 100 MG tablet   Oral   Take 200 mg by mouth every morning.          BP 113/53  Pulse 69  Temp(Src) 97.6 F (36.4 C) (Rectal)  Resp 18  SpO2 100% Physical Exam  Constitutional: He appears well-developed and well-nourished.  HENT:  Head: Normocephalic and atraumatic.  Dry mucous membranes  Eyes: EOM are normal. Pupils are equal, round, and reactive to light.  Neck: Neck supple.  Cardiovascular: Normal rate, regular rhythm and intact distal pulses.   Pulmonary/Chest: Effort normal and breath sounds normal. No respiratory distress. He exhibits no tenderness.  Abdominal: Soft. He exhibits no distension. There is no tenderness.  Musculoskeletal: Normal range of motion.  Chronic changes lower extremities with bilateral lower extremity edema  Neurological:  Awake, alert and oriented to self and place. No unilateral deficits  Skin: Skin is warm and dry.    ED Course  Procedures (including critical care time) Labs Review Labs Reviewed  CBC - Abnormal; Notable for the following:    WBC 3.9 (*)    RBC 3.09 (*)    Hemoglobin 10.8 (*)    HCT 32.1 (*)    MCV 103.9 (*)    MCH 35.0 (*)    RDW 15.8 (*)    Platelets 35 (*)    All other components within normal limits  COMPREHENSIVE METABOLIC PANEL - Abnormal; Notable for the following:    Sodium 136 (*)    Chloride 95 (*)    Glucose, Bld 104 (*)    Albumin 2.9 (*)    AST 51 (*)     Alkaline Phosphatase 154 (*)    Total Bilirubin 4.3 (*)    GFR calc non Af Amer 81 (*)    All other components within normal limits  AMMONIA - Abnormal; Notable for the following:    Ammonia 75 (*)    All other components within normal limits  URINALYSIS, ROUTINE W REFLEX MICROSCOPIC - Abnormal; Notable for the following:    Color, Urine AMBER (*)    Urobilinogen, UA 2.0 (*)    All other components within normal limits   Imaging Review Ct Head Wo Contrast  03/23/2013   CLINICAL DATA:  Altered mental status, lethargy, lightheadedness.  EXAM: CT HEAD WITHOUT CONTRAST  TECHNIQUE: Contiguous axial images were obtained from the base of the skull through the vertex without intravenous contrast.  COMPARISON:  CT head January 11, 2013  FINDINGS: The ventricles and sulci are normal for age. No intraparenchymal hemorrhage, mass effect nor midline shift. Minimal patchy supratentorial white matter hypodensities are within normal range for patient's age and though non-specific suggest sequelae of chronic small vessel ischemic disease. No acute large vascular territory infarcts.  No abnormal extra-axial fluid collections. Basal cisterns are patent. Moderate calcific atherosclerosis of the carotid  siphons.  No skull fracture. Small left maxillary mucosal retention cysts without paranasal sinus air fluid levels. Trace soft tissue density within the left mastoid tip, improved. Soft tissue within the external auditory canals bilaterally may reflect cerumen. The included ocular globes and orbital contents are non-suspicious.  IMPRESSION: No acute intracranial process.   Electronically Signed   By: Elon Alas   On: 03/23/2013 04:01   Dg Chest Portable 1 View  03/23/2013   CLINICAL DATA:  Altered mental status, shortness of breath.  EXAM: PORTABLE CHEST - 1 VIEW  COMPARISON:  Chest radiograph February 18, 2013  FINDINGS: Cardiac silhouette appears mildly enlarged unchanged, mediastinal silhouette is  unremarkable. Similar central pulmonary vasculature congestion with increasing mild interstitial prominence. No pleural effusions or focal consolidations. No pneumothorax.  Multiple EKG lines overlie the patient and may obscure subtle underlying pathology. Soft tissue planes and included osseous structures are nonsuspicious.  IMPRESSION: Mild cardiomegaly with increasing mild interstitial prominence which may reflect interstitial edema.   Electronically Signed   By: Elon Alas   On: 03/23/2013 02:01    EKG Interpretation   None      Lactulose provided. Patient remains confused and does not want to be admitted to the hospital. He is alert and oriented to self and place. He does have some insight to his condition, but I do not feel he is safe for discharge home to his independent living facility.   Discussed with family, daughter is healthcare power of attorney and is on her way to the emergency department 5:49 AM patient declining admission and daughter recommending admit.  I've explained to the patient that feel he would benefit from admission for hepatic encephalopathy. Patient evaluated by triad hospitalist.   Family present at bedside and patient agreeable to admit   MDM  Diagnosis: Hepatic encephalopathy  Evaluated with labs and imaging reviewed as above. Medications provided. MED admit   Teressa Lower, MD 03/24/13 [phone]

## 2013-03-23 NOTE — ED Notes (Addendum)
Charge RN asked to attempt IV w/ ultrasound.

## 2013-03-23 NOTE — H&P (Signed)
[facility]  History and Physical Orel Hord L. 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 Ammonia elevated in ED. [facility] consulted to admit for treatment    Review of Systems:  Neg except as noted above   Past Medical History  Diagnosis Date  . Cirrhosis of liver   . Hepatitis C   . ETOH abuse   . Hypothyroid   . Psychosis   . Bipolar disorder   . Seizures   . Arthritis   . Back pain   . Coagulopathy     Hx of  . Thrombocytopenia     Hx of  . Pancytopenia     Hx of  . H/O hypokalemia   . Hyponatremia     Hx of  . Korsakoff psychosis   . H/O abdominal abscess   . H/O esophageal varices   . Metabolic encephalopathy   . Hepatic encephalopathy   . Urinary urgency   . H/O renal failure   . H/O ascites   . Altered mental status   . Anemia   . Ascites   . Shortness of breath   . Peripheral vascular disease   . GERD (gastroesophageal reflux disease)   . Thrombocytopenia, acquired 06/02/2012  . Unspecified hypothyroidism 06/02/2012  . Pain in joint, pelvic region and thigh   . Edema   . Obesity, unspecified   . Chest pain, unspecified   . Pneumonitis due to inhalation of food or vomitus   . Other convulsions   . Cellulitis and abscess of upper arm and forearm   . Chronic hepatitis C without mention of hepatic coma   . Hypopotassemia   . Anemia, unspecified   . Other and unspecified coagulation defects   . Thrombocytopenia, unspecified   . Bipolar I disorder, most recent episode (or current) unspecified   . Unspecified psychosis   . Encephalopathy   . Esophageal varices without mention of bleeding   . Esophagitis, unspecified   . Abscess of  liver(572.0)   . Chronic kidney disease, unspecified   . Lumbago   . Personal history of alcoholism   . Personal history of tobacco use, presenting hazards to health   . Cirrhosis of liver without mention of alcohol   . Pneumonitis due to inhalation of food or vomitus 09/19/2010  . Chronic hepatitis C without mention of hepatic coma   . Bipolar I disorder, most recent episode (or current) unspecified   . Unspecified psychosis   . Other ascites   . Personal history of tobacco use, presenting hazards to health   . Cirrhosis of liver without mention of alcohol   . Type 2 diabetes mellitus with diabetic neuropathy     Past Surgical History  Procedure Laterality Date  . Colostomy    . Cholecystectomy    . Small intestine surgery    . Arm surgery      left arm  . US guided drain placement in ventral hernia abscess  07/21/2010  . Multiple picc line placements    . Esophagogastroduodenoscopy  06/28/2011    Procedure: ESOPHAGOGASTRODUODENOSCOPY (EGD);  Surgeon: Louis Meckel, MD;  Location: [facility];  Service: Endoscopy;  Laterality: N/A;  . Esophagogastroduodenoscopy N/A  05/06/2012    Procedure: ESOPHAGOGASTRODUODENOSCOPY (EGD);  Surgeon: Inda Castle, MD;  Location: [facility];  Service: Endoscopy;  Laterality: N/A;  . Esophageal banding N/A 05/06/2012    Procedure: ESOPHAGEAL BANDING;  Surgeon: Inda Castle, MD;  Location: [facility];  Service: Endoscopy;  Laterality: N/A;    Social History:  reports that he quit smoking about 5 years ago. His smoking use included Cigarettes. He smoked 0.00 packs per day. He has never used smokeless tobacco. He reports that he does not drink alcohol or use illicit drugs.  Allergies  Allergen Reactions  . Ativan [Lorazepam] Other (See Comments)    "hallucinations"  . Droperidol Other (See Comments)    unknown  . Penicillins Swelling  . Toradol [Ketorolac Tromethamine] Hives    Family History  Problem Relation Age of Onset  . Heart  disease Mother     MI  . Lung cancer Brother      Prior to Admission medications   Medication Sig Start Date End Date Taking? Authorizing Provider  ALPRAZolam Duanne Moron) 0.5 MG tablet Take 1 tablet (0.5 mg total) by mouth 2 (two) times daily as needed for anxiety. 02/25/13  Yes Annita Brod, MD  ferrous sulfate 325 (65 FE) MG tablet Take 325 mg by mouth at bedtime.   Yes Historical Provider, MD  folic acid (FOLVITE) 1 MG tablet Take 1 mg by mouth every morning.   Yes Historical Provider, MD  furosemide (LASIX) 40 MG tablet Take 40 mg by mouth 2 (two) times daily.   Yes Historical Provider, MD  insulin glargine (LANTUS) 100 UNIT/ML injection Inject 0.12 mLs (12 Units total) into the skin at bedtime. 02/11/13  Yes Pricilla Larsson, NP  lactulose (CHRONULAC) 10 GM/15ML solution Take 45 g by mouth 5 (five) times daily.  01/08/13  Yes Pricilla Larsson, NP  levETIRAcetam (KEPPRA) 500 MG tablet Take 1 tablet (500 mg total) by mouth 2 (two) times daily. 02/11/13  Yes Pricilla Larsson, NP  levothyroxine (SYNTHROID, LEVOTHROID) 75 MCG tablet Take 75 mcg by mouth every morning.    Yes Historical Provider, MD  Multiple Vitamin (MULTIVITAMIN WITH MINERALS) TABS tablet Take 1 tablet by mouth every morning.   Yes Historical Provider, MD  nadolol (CORGARD) 40 MG tablet Take 1 tablet (40 mg total) by mouth every morning. 02/11/13  Yes Pricilla Larsson, NP  omeprazole (PRILOSEC) 20 MG capsule Take 20 mg by mouth every morning.    Yes Historical Provider, MD  oxyCODONE (OXY IR/ROXICODONE) 5 MG immediate release tablet Take 10 mg by mouth 5 (five) times daily.   Yes Historical Provider, MD  potassium chloride (K-DUR) 10 MEQ tablet Take 10 mEq by mouth every morning.   Yes Historical Provider, MD  rifaximin (XIFAXAN) 550 MG TABS tablet Take 550 mg by mouth 2 (two) times daily.   Yes Historical Provider, MD  risperiDONE (RISPERDAL) 0.25 MG tablet Take 0.25 mg by mouth 2 (two) times daily.   Yes Historical Provider, MD   spironolactone (ALDACTONE) 100 MG tablet Take 200 mg by mouth every morning.   Yes Historical Provider, MD   Physical Exam: Filed Vitals:   03/23/13 0050 03/23/13 0525  BP: 115/48 113/53  Pulse: 72 69  Temp: 98.7 F (37.1 C) 97.6 F (36.4 C)  TempSrc: Oral Rectal  Resp: 18 18  SpO2: 100% 100%     General:  Confused white male  HEENT:  Icteric sclera, MMM   Cardiovascular: RRR, no MRG   Respiratory:  CTAB, no crackles   Abdomen: mild distension, NT, normal BS   Skin: jaundiced , thickened skin on legs B/L   Musculoskeletal: no focal deficit  Psychiatric: no anxiety/ depression  Neurologic: no focal deficits   Wt Readings from Last 3 Encounters:  03/05/13 120.838 kg (266 lb 6.4 oz)  02/25/13 128.595 kg (283 lb 8 oz)  02/11/13 115.214 kg (254 lb)    Labs on Admission:  Basic Metabolic Panel:  Recent Labs Lab 03/23/13 0219  NA 136*  K 4.0  CL 95*  CO2 27  GLUCOSE 104*  BUN 23  CREATININE 1.01  CALCIUM 8.6    Liver Function Tests:  Recent Labs Lab 03/23/13 0219  AST 51*  ALT 18  ALKPHOS 154*  BILITOT 4.3*  PROT 7.2  ALBUMIN 2.9*   No results found for this basename: LIPASE, AMYLASE,  in the last 168 hours  Recent Labs Lab 03/23/13 0219  AMMONIA 75*    CBC:  Recent Labs Lab 03/23/13 0219  WBC 3.9*  HGB 10.8*  HCT 32.1*  MCV 103.9*  PLT 35*    Cardiac Enzymes: No results found for this basename: CKTOTAL, CKMB, CKMBINDEX, TROPONINI,  in the last 168 hours  Troponin (Point of Care Test) No results found for this basename: TROPIPOC,  in the last 72 hours  BNP (last 3 results)  Recent Labs  12/03/12 1435 01/11/13 2055 02/18/13 0234  PROBNP 297.7* 207.4* 985.8*    CBG: No results found for this basename: GLUCAP,  in the last 168 hours   Radiological Exams on Admission: Ct Head Wo Contrast  03/23/2013   CLINICAL DATA:  Altered mental status, lethargy, lightheadedness.  EXAM: CT HEAD WITHOUT CONTRAST  TECHNIQUE:  Contiguous axial images were obtained from the base of the skull through the vertex without intravenous contrast.  COMPARISON:  CT head January 11, 2013  FINDINGS: The ventricles and sulci are normal for age. No intraparenchymal hemorrhage, mass effect nor midline shift. Minimal patchy supratentorial white matter hypodensities are within normal range for patient's age and though non-specific suggest sequelae of chronic small vessel ischemic disease. No acute large vascular territory infarcts.  No abnormal extra-axial fluid collections. Basal cisterns are patent. Moderate calcific atherosclerosis of the carotid siphons.  No skull fracture. Small left maxillary mucosal retention cysts without paranasal sinus air fluid levels. Trace soft tissue density within the left mastoid tip, improved. Soft tissue within the external auditory canals bilaterally may reflect cerumen. The included ocular globes and orbital contents are non-suspicious.  IMPRESSION: No acute intracranial process.   Electronically Signed   By: Elon Alas   On: 03/23/2013 04:01   Dg Chest Portable 1 View  03/23/2013   CLINICAL DATA:  Altered mental status, shortness of breath.  EXAM: PORTABLE CHEST - 1 VIEW  COMPARISON:  Chest radiograph February 18, 2013  FINDINGS: Cardiac silhouette appears mildly enlarged unchanged, mediastinal silhouette is unremarkable. Similar central pulmonary vasculature congestion with increasing mild interstitial prominence. No pleural effusions or focal consolidations. No pneumothorax.  Multiple EKG lines overlie the patient and may obscure subtle underlying pathology. Soft tissue planes and included osseous structures are nonsuspicious.  IMPRESSION: Mild cardiomegaly with increasing mild interstitial prominence which may reflect interstitial edema.   Electronically Signed   By: Elon Alas   On: 03/23/2013 02:01      Active Problems:   Hepatic encephalopathy   Assessment/Plan Hepatic encephalopathy  2/2 noncompliance w/ meds  - resuming home meds of lactulose, xifaxan, lasix, aldactone.  DM - home meds  Hypothyroid - home meds  Resumed all other chronic home medications   Code Status: full  Family Communication: wife is HCPOA and is on way to hospital  Disposition Plan/Anticipated LOS: 3-5 days   Time spent: 40 minutes  Velna Hatchet, MD  Internal Medicine Pager [phone] If 7PM-7AM, please contact night-coverage at [email or web address], password [facility] 03/23/2013, 5:55 AM

## 2013-03-23 NOTE — ED Notes (Signed)
Per EMS report: pt from [place] (Independent Living), daughter called out saying pt is alt mental status.  Pt has not been taking his lactulose. Pt is alert to person. EMS VS: BP: 90/60, HR: 90,CBG: 140, 92%RA and then after 4L O2 came up to 99%.

## 2013-03-23 NOTE — ED Notes (Addendum)
MD at bedside. Aware of BP

## 2013-03-23 NOTE — Progress Notes (Signed)
Pt seen and examined at bedside. BP soft 8's/60's. Place on IVF and provide bolus NS. Monitor vitals closely. Pt admitted for acute hepatic encephalopathy and please see earlier admission note by Dr. Ardeth Perfect. Check CMET and ammonia level in AM.  Faye Ramsay, MD  [facility] Pager [phone]  If 7PM-7AM, please contact night-coverage [email or web address] Password [email or web address]

## 2013-03-23 NOTE — ED Notes (Signed)
Stuck pt 2x, unable to obtain blood work. Charge nurse attempted as well. Dr Marnette Burgess aware.

## 2013-03-23 NOTE — Progress Notes (Signed)
Patient from [facility]

## 2013-03-23 NOTE — ED Notes (Signed)
Charge RN at bedside attempting to start IV.

## 2013-03-24 DIAGNOSIS — R4182 Altered mental status, unspecified: Secondary | ICD-10-CM

## 2013-03-24 DIAGNOSIS — R609 Edema, unspecified: Secondary | ICD-10-CM

## 2013-03-24 LAB — CBC
HCT: 32.9 % — ABNORMAL LOW (ref 39.0–52.0)
Hemoglobin: 10.9 g/dL — ABNORMAL LOW (ref 13.0–17.0)
MCH: 34.8 pg — ABNORMAL HIGH (ref 26.0–34.0)
MCHC: 33.1 g/dL (ref 30.0–36.0)
MCV: 105.1 fL — ABNORMAL HIGH (ref 78.0–100.0)
PLATELETS: 34 10*3/uL — AB (ref 150–400)
RBC: 3.13 MIL/uL — AB (ref 4.22–5.81)
RDW: 16 % — ABNORMAL HIGH (ref 11.5–15.5)
WBC: 3.2 10*3/uL — AB (ref 4.0–10.5)

## 2013-03-24 LAB — COMPREHENSIVE METABOLIC PANEL
ALT: 20 U/L (ref 0–53)
AST: 59 U/L — ABNORMAL HIGH (ref 0–37)
Albumin: 2.8 g/dL — ABNORMAL LOW (ref 3.5–5.2)
Alkaline Phosphatase: 160 U/L — ABNORMAL HIGH (ref 39–117)
BUN: 19 mg/dL (ref 6–23)
CALCIUM: 8.7 mg/dL (ref 8.4–10.5)
CO2: 27 mEq/L (ref 19–32)
CREATININE: 0.91 mg/dL (ref 0.50–1.35)
Chloride: 102 mEq/L (ref 96–112)
GFR calc Af Amer: >90 mL/min (ref >90)
GLUCOSE: 150 mg/dL — AB (ref 70–99)
POTASSIUM: 4.1 meq/L (ref 3.7–5.3)
SODIUM: 139 meq/L (ref 137–147)
Total Bilirubin: 3 mg/dL — ABNORMAL HIGH (ref 0.3–1.2)
Total Protein: 7.2 g/dL (ref 6.0–8.3)

## 2013-03-24 LAB — GLUCOSE, CAPILLARY
GLUCOSE-CAPILLARY: 134 mg/dL — AB (ref 70–99)
Glucose-Capillary: 114 mg/dL — ABNORMAL HIGH (ref 70–99)
Glucose-Capillary: 157 mg/dL — ABNORMAL HIGH (ref 70–99)

## 2013-03-24 LAB — AMMONIA: AMMONIA: 166 umol/L — AB (ref 11–60)

## 2013-03-24 MED ORDER — RIFAXIMIN 550 MG PO TABS
550.0000 mg | ORAL_TABLET | Freq: Three times a day (TID) | ORAL | Status: DC
  Administered 2013-03-24 – 2013-04-02 (×27): 550 mg via ORAL
  Filled 2013-03-24 (×29): qty 1

## 2013-03-24 NOTE — Progress Notes (Signed)
Patient has been followed by [place] [facility] services. However, most recently, [facility] Coordinator has not been able to contact patient's daughter, [name] (primary contact). She has not returned any calls. Spoke with patient at bedside. He asked " can you guys find me a different place to live that has rehab"? He currently resides in [facility]. Spoke with inpatient RNCM to make aware of patient's request. Noted he has declined SNF in the past. Will continue to follow.  Marthenia Rolling, MSN, RN,BSN- [facility] [phone]

## 2013-03-24 NOTE — Progress Notes (Signed)
Patient attempting to get out of bed, bed alarm on, attempted to reorient patient but he remains extremely confused and agitated.  Assisted to bathroom using front wheel walker.  Patient continues insist nothing is wrong with him.  Instructed him to call for help when needs assistance out of bed and bed alarm initiated after returning from bathroom

## 2013-03-24 NOTE — Progress Notes (Signed)
Patient ID: Tony Boyd, male   DOB: 12-28-1955, 58 y.o.   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 Ammonia elevated in ED. [facility] consulted to admit for treatment   Active Problems:   Hepatic encephalopathy - appears to be worse in terms of confusion this AM - ammonia level is worse this AM, 75 --> 166 - pt is already on Lactulose 45 gm 5 x day, rifaximin, lasix, spironolactone - will continue same regimen but will increase the frequency of rifaximin from BID to TID  - check ammonia level in AM - will place on CIWA protocol as well    Diabetes mellitus - will continue Lantus and will also place on SSI   Hypothyroidism - will continue Synthroid   Seizures - continue Keppra    Transaminitis with elevated bili - secondary to lover cirrhosis - CME in AM  Consultants:  None  Procedures/Studies: Ct Head Wo Contrast   03/23/2013  \No acute intracranial process.     CXR   03/23/2013  Mild cardiomegaly with increasing mild interstitial prominence which may reflect interstitial edema.    Antibiotics:  None  Code Status: Full Family Communication: No family at bedside  Disposition Plan: Home when medically stable  HPI/Subjective: No events overnight.   Objective: Filed Vitals:   03/23/13 2222 03/24/13 0509 03/24/13 0552 03/24/13 1530  BP: 126/70  111/64 113/69  Pulse: 61  65 88  Temp: 97.9 F (36.6 C)  97.6 F (36.4 C) 98 F (36.7 C)  TempSrc: Oral  Oral Oral  Resp: 18  18 16   Height:      Weight:  104.9 kg (231 lb 4.2 oz)    SpO2: 94%  94% 97%    Intake/Output Summary (Last 24 hours) at 03/24/13 1807 Last data filed at 03/24/13 1337  Gross per 24 hour  Intake    480 ml  Output      0 ml  Net    480 ml     Exam:   General:  Pt is alert, confused, following commands appropriately but talking with no specific target and point   Cardiovascular: Regular rate and rhythm, S1/S2, no murmurs, no rubs, no gallops  Respiratory: Clear to auscultation bilaterally, no wheezing, no crackles, no rhonchi  Abdomen: Soft, non tender, slightly distended, bowel sounds present, no guarding  Extremities: +1 bilateral LE pitting edema, pulses DP and PT palpable bilaterally  Data Reviewed: Basic Metabolic Panel:  Recent Labs Lab 03/23/13 0219 03/24/13 0450  NA 136* 139  K 4.0 4.1  CL 95* 102  CO2 27 27  GLUCOSE 104* 150*  BUN 23 19  CREATININE 1.01 0.91  CALCIUM 8.6 8.7   Liver Function Tests:  Recent Labs Lab 03/23/13 0219 03/24/13 0450  AST 51* 59*  ALT 18 20  ALKPHOS 154* 160*  BILITOT 4.3* 3.0*  PROT 7.2 7.2  ALBUMIN 2.9* 2.8*    Recent Labs Lab 03/23/13 0219 03/24/13 0450  AMMONIA 75* 166*   CBC:  Recent Labs Lab 03/23/13 0219 03/24/13 0450  WBC 3.9* 3.2*  HGB 10.8* 10.9*  HCT 32.1* 32.9*  MCV 103.9* 105.1*  PLT 35* 34*   CBG:  Recent Labs Lab 03/23/13 1627 03/23/13 2221 03/24/13 0756 03/24/13 1227 03/24/13 1713  GLUCAP  177* 123* 114* 134* 157*    Recent Results (from the past 240 hour(s))  MRSA PCR SCREENING     Status: None   Collection Time    03/23/13  1:39 PM      Result Value Range Status   MRSA by PCR NEGATIVE  NEGATIVE Final   Comment:            The GeneXpert MRSA Assay (FDA     approved for NASAL specimens     only), is one component of a     comprehensive MRSA colonization     surveillance program. It is not     intended to diagnose MRSA     infection nor to guide or     monitor treatment for     MRSA infections.     Scheduled Meds: . ferrous sulfate  325 mg Oral QHS  . folic acid  1 mg Oral q morning - 10a  . furosemide  40 mg Oral BID  . insulin aspart  0-5 Units Subcutaneous QHS  . insulin aspart  0-9 Units Subcutaneous  TID WC  . insulin aspart  3 Units Subcutaneous TID WC  . insulin glargine  12 Units Subcutaneous QHS  . lactulose  45 g Oral 5 X Daily  . levETIRAcetam  500 mg Oral BID  . levothyroxine  75 mcg Oral q morning - 10a  . multivitamin with minerals  1 tablet Oral q morning - 10a  . nadolol  40 mg Oral q morning - 10a  . oxyCODONE  10 mg Oral 5 X Daily  . pantoprazole  40 mg Oral Daily  . potassium chloride  10 mEq Oral q morning - 10a  . rifaximin  550 mg Oral TID  . spironolactone  200 mg Oral q morning - 10a   Continuous Infusions:    Faye Ramsay, MD  [facility] Pager [phone]  If 7PM-7AM, please contact night-coverage [email or web address] Password [email or web address] 03/24/2013, 6:07 PM   LOS: 1 day

## 2013-03-25 DIAGNOSIS — E118 Type 2 diabetes mellitus with unspecified complications: Secondary | ICD-10-CM

## 2013-03-25 DIAGNOSIS — IMO0002 Reserved for concepts with insufficient information to code with codable children: Secondary | ICD-10-CM

## 2013-03-25 DIAGNOSIS — D638 Anemia in other chronic diseases classified elsewhere: Secondary | ICD-10-CM

## 2013-03-25 DIAGNOSIS — K7460 Unspecified cirrhosis of liver: Secondary | ICD-10-CM

## 2013-03-25 DIAGNOSIS — E1165 Type 2 diabetes mellitus with hyperglycemia: Secondary | ICD-10-CM

## 2013-03-25 LAB — COMPREHENSIVE METABOLIC PANEL
ALT: 25 U/L (ref 0–53)
AST: 77 U/L — ABNORMAL HIGH (ref 0–37)
Albumin: 3 g/dL — ABNORMAL LOW (ref 3.5–5.2)
Alkaline Phosphatase: 163 U/L — ABNORMAL HIGH (ref 39–117)
BILIRUBIN TOTAL: 3.2 mg/dL — AB (ref 0.3–1.2)
BUN: 15 mg/dL (ref 6–23)
CHLORIDE: 101 meq/L (ref 96–112)
CO2: 27 meq/L (ref 19–32)
CREATININE: 0.76 mg/dL (ref 0.50–1.35)
Calcium: 9 mg/dL (ref 8.4–10.5)
GFR calc Af Amer: >90 mL/min (ref >90)
GFR calc non Af Amer: >90 mL/min (ref >90)
Glucose, Bld: 137 mg/dL — ABNORMAL HIGH (ref 70–99)
Potassium: 4.2 mEq/L (ref 3.7–5.3)
SODIUM: 137 meq/L (ref 137–147)
Total Protein: 7.7 g/dL (ref 6.0–8.3)

## 2013-03-25 LAB — GLUCOSE, CAPILLARY
GLUCOSE-CAPILLARY: 131 mg/dL — AB (ref 70–99)
GLUCOSE-CAPILLARY: 116 mg/dL — AB (ref 70–99)
Glucose-Capillary: 124 mg/dL — ABNORMAL HIGH (ref 70–99)
Glucose-Capillary: 135 mg/dL — ABNORMAL HIGH (ref 70–99)
Glucose-Capillary: 133 mg/dL — ABNORMAL HIGH (ref 70–99)

## 2013-03-25 LAB — CBC
HCT: 36.3 % — ABNORMAL LOW (ref 39.0–52.0)
Hemoglobin: 11.7 g/dL — ABNORMAL LOW (ref 13.0–17.0)
MCH: 34.4 pg — AB (ref 26.0–34.0)
MCHC: 32.2 g/dL (ref 30.0–36.0)
MCV: 106.8 fL — ABNORMAL HIGH (ref 78.0–100.0)
PLATELETS: 38 10*3/uL — AB (ref 150–400)
RBC: 3.4 MIL/uL — ABNORMAL LOW (ref 4.22–5.81)
RDW: 15.8 % — ABNORMAL HIGH (ref 11.5–15.5)
WBC: 3.2 10*3/uL — ABNORMAL LOW (ref 4.0–10.5)

## 2013-03-25 LAB — AMMONIA: Ammonia: 58 umol/L (ref 11–60)

## 2013-03-25 MED ORDER — ADULT MULTIVITAMIN W/MINERALS CH
1.0000 | ORAL_TABLET | Freq: Every day | ORAL | Status: DC
  Administered 2013-03-25 – 2013-04-02 (×9): 1 via ORAL
  Filled 2013-03-25 (×9): qty 1

## 2013-03-25 MED ORDER — LORAZEPAM 2 MG/ML IJ SOLN
1.0000 mg | INTRAMUSCULAR | Status: DC | PRN
  Administered 2013-03-25 – 2013-03-26 (×2): 1 mg via INTRAVENOUS
  Filled 2013-03-25 (×3): qty 1

## 2013-03-25 MED ORDER — VITAMIN B-1 100 MG PO TABS
100.0000 mg | ORAL_TABLET | Freq: Every day | ORAL | Status: DC
  Administered 2013-03-25 – 2013-04-02 (×9): 100 mg via ORAL
  Filled 2013-03-25 (×9): qty 1

## 2013-03-25 MED ORDER — LORAZEPAM 2 MG/ML IJ SOLN: 1.0000 mg | Freq: Four times a day (QID) | INTRAMUSCULAR | Status: DC | PRN

## 2013-03-25 MED ORDER — LORAZEPAM 2 MG/ML IJ SOLN
2.0000 mg | Freq: Once | INTRAMUSCULAR | Status: AC
  Administered 2013-03-25: 2 mg via INTRAVENOUS
  Filled 2013-03-25: qty 1

## 2013-03-25 MED ORDER — OXYCODONE HCL 5 MG PO TABS
10.0000 mg | ORAL_TABLET | Freq: Four times a day (QID) | ORAL | Status: DC | PRN
  Administered 2013-03-25 – 2013-03-27 (×5): 10 mg via ORAL
  Filled 2013-03-25 (×5): qty 2

## 2013-03-25 MED ORDER — LORAZEPAM 2 MG/ML IJ SOLN: 0.5000 mg | Freq: Once | INTRAMUSCULAR | Status: DC

## 2013-03-25 MED ORDER — LORAZEPAM 2 MG/ML IJ SOLN: 0.0000 mg | Freq: Two times a day (BID) | INTRAMUSCULAR | Status: DC

## 2013-03-25 MED ORDER — LORAZEPAM 1 MG PO TABS
1.0000 mg | ORAL_TABLET | ORAL | Status: DC | PRN
  Administered 2013-03-25: 1 mg via ORAL

## 2013-03-25 MED ORDER — THIAMINE HCL 100 MG/ML IJ SOLN
100.0000 mg | Freq: Every day | INTRAMUSCULAR | Status: DC
  Filled 2013-03-25 (×6): qty 1

## 2013-03-25 MED ORDER — LORAZEPAM 1 MG PO TABS
1.0000 mg | ORAL_TABLET | Freq: Four times a day (QID) | ORAL | Status: DC | PRN
  Filled 2013-03-25: qty 1

## 2013-03-25 MED ORDER — LORAZEPAM 2 MG/ML IJ SOLN
0.0000 mg | Freq: Four times a day (QID) | INTRAMUSCULAR | Status: AC
  Administered 2013-03-25: 1 mg via INTRAVENOUS
  Administered 2013-03-25 (×2): 2 mg via INTRAVENOUS
  Administered 2013-03-26 (×2): 4 mg via INTRAVENOUS
  Administered 2013-03-26: 2 mg via INTRAVENOUS
  Filled 2013-03-25: qty 1
  Filled 2013-03-25: qty 2
  Filled 2013-03-25 (×3): qty 1
  Filled 2013-03-25: qty 2
  Filled 2013-03-25: qty 1

## 2013-03-25 MED ORDER — FOLIC ACID 1 MG PO TABS
1.0000 mg | ORAL_TABLET | Freq: Every day | ORAL | Status: DC
  Administered 2013-03-25 – 2013-04-02 (×9): 1 mg via ORAL
  Filled 2013-03-25 (×9): qty 1

## 2013-03-25 NOTE — Evaluation (Signed)
Physical Therapy Evaluation Patient Details Name: Tony Boyd MRN: [id number] DOB: August 30, 1955 Today's Date: 03/25/2013 Time: 1540-0867 PT Time Calculation (min): 19 min  PT Assessment / Plan / Recommendation History of Present Illness  58 yo male admitted with hepatic encephalopathy. Hx of bipolar d/o, Hep C, alcoholic cirrhosis, seizure, frequent admissions  Clinical Impression  On eval, pt required Min assist for mobility-able to ambulate ~150 feet with walker. Decreased mobility and safety awareness evident this session. Familiar with pt from previous admissions. Recommend SNF at this time.     PT Assessment  Patient needs continued PT services    Follow Up Recommendations  SNF (unless pt's cognition/mobility/safety awarenss improve significantly)    Does the patient have the potential to tolerate intense rehabilitation      Barriers to Discharge        Equipment Recommendations  None recommended by PT    Recommendations for Other Services OT consult   Frequency Min 3X/week    Precautions / Restrictions Precautions Precautions: Fall Restrictions Weight Bearing Restrictions: No   Pertinent Vitals/Pain L lower back, L leg-unrated      Mobility  Bed Mobility Overal bed mobility: Needs Assistance Bed Mobility: Sit to Supine Sit to supine: Min guard General bed mobility comments: close guard for safety Transfers Overall transfer level: Needs assistance Transfers: Sit to/from Stand Sit to Stand: Min guard General transfer comment: close guard for safety. vcs hand placement Ambulation/Gait Ambulation/Gait assistance: Min assist Ambulation Distance (Feet): 150 Feet Assistive device: Rolling walker (2 wheeled) General Gait Details: Assist to stabilize pt and maneuver with walker. Pt with decreased attention to task/decreased safety awareness-bumping into objects in environment.    Exercises     PT Diagnosis: Generalized weakness;Altered mental status;Difficulty  walking  PT Problem List: Decreased mobility;Decreased safety awareness;Decreased knowledge of use of DME;Decreased strength;Decreased cognition PT Treatment Interventions: DME instruction;Gait training;Functional mobility training;Therapeutic activities;Therapeutic exercise;Patient/family education     PT Goals(Current goals can be found in the care plan section) Acute Rehab PT Goals Patient Stated Goal: none stated PT Goal Formulation: Patient unable to participate in goal setting Time For Goal Achievement: 04/08/13 Potential to Achieve Goals: Good  Visit Information  Last PT Received On: 03/25/13 Assistance Needed: +1 History of Present Illness: 58 yo male admitted with hepatic encephalopathy. Hx of bipolar d/o, Hep C, alcoholic cirrhosis, seizure, frequent admissions       Prior Functioning  Home Living Family/patient expects to be discharged to:: Unsure Living Arrangements: Alone Available Help at Discharge: Family Type of Home: Independent living facility Home Access: Level entry;Elevator Home Layout: One level Home Equipment: Environmental consultant - 4 wheels;Tub bench;Shower seat;Grab bars - tub/shower;Bedside commode;Cane - single point;Toilet riser Communication Communication: No difficulties    Cognition  Cognition Arousal/Alertness: Awake/alert Behavior During Therapy: Restless Overall Cognitive Status: Impaired/Different from baseline Area of Impairment: Orientation;Attention;Memory;Safety/judgement;Problem solving Orientation Level: Disoriented to;Place;Time;Situation Current Attention Level: Focused Memory: Decreased short-term memory;Decreased recall of precautions Safety/Judgement: Decreased awareness of safety;Decreased awareness of deficits Problem Solving: Requires tactile cues;Requires verbal cues    Extremity/Trunk Assessment Upper Extremity Assessment Upper Extremity Assessment: Overall WFL for tasks assessed Lower Extremity Assessment Lower Extremity Assessment:  Generalized weakness Cervical / Trunk Assessment Cervical / Trunk Assessment: Normal   Balance    End of Session PT - End of Session Equipment Utilized During Treatment: Gait belt Activity Tolerance: Patient tolerated treatment well Patient left: in bed;with call bell/phone within reach;with bed alarm set  GP     Tony Boyd, MPT Pager: [phone]

## 2013-03-25 NOTE — Progress Notes (Addendum)
Patient alert and oriented, some confusion at time and speaking about subjects that are not related to the topic of discussion at hand, patient informed repeatedly  about fall safety precautions and not getting up out of the bed by himself, patient continues to be noncompliant and argumentative with staff when reminding him of not getting up by himself, bed alarm on, red socks on, sign out side room on frame of door, and bed side commode in reach, patient still will not comply with instruction to ask for help before getting out of bed repeatedly have to run to the sound of the bed alarm going off because patient is getting out of bed or has already gotten out of bed, patient noncompliant over and over again will not comply after strong instructions from staff about fall safety

## 2013-03-25 NOTE — Progress Notes (Addendum)
Patient ID: Tony Boyd, male   DOB: [birth year], 58 y.o.   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 Ammonia elevated in ED. 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     CXR   03/23/2013  Mild cardiomegaly with increasing mild interstitial prominence which may reflect interstitial edema.    Antibiotics:  None  Code Status: Full Family Communication: No family at bedside , called and d/w daughter Disposition Plan: Home vs ALF when medically stable  HPI/Subjective: No events overnight, remains confused  Objective: Filed Vitals:   03/24/13 2138 03/25/13 0212 03/25/13 0553 03/25/13 0728  BP: 105/63 106/60 145/73   Pulse: 59 64 69   Temp: 97.8 F (36.6 C)  97.5 F (36.4 C)   TempSrc: Oral  Oral   Resp: 18  18   Height:      Weight:    105.5 kg (232 lb 9.4 oz)  SpO2: 96%  94%     Intake/Output Summary (Last 24 hours) at 03/25/13 1317 Last data filed at 03/24/13 1337  Gross per 24 hour  Intake    240 ml  Output      0 ml  Net    240 ml    Exam:   General:  Pt is alert, awake, but confused  Cardiovascular: Regular  rate and rhythm, S1/S2, no murmurs, no rubs, no gallops  Respiratory: Clear to auscultation bilaterally, no wheezing, no crackles, no rhonchi  Abdomen: Soft, non tender, slightly distended, bowel sounds present, no guarding  Extremities: +1 bilateral LE pitting edema, pulses DP and PT palpable bilaterally  Neuro: no asterixes  Data Reviewed: Basic Metabolic Panel:  Recent Labs Lab 03/23/13 0219 03/24/13 0450 03/25/13 0542  NA 136* 139 137  K 4.0 4.1 4.2  CL 95* 102 101  CO2 27 27 27   GLUCOSE 104* 150* 137*  BUN 23 19 15   CREATININE 1.01 0.91 0.76  CALCIUM 8.6 8.7 9.0   Liver Function Tests:  Recent Labs Lab 03/23/13 0219 03/24/13 0450 03/25/13 0542  AST 51* 59* 77*  ALT 18 20 25   ALKPHOS 154* 160* 163*  BILITOT 4.3* 3.0* 3.2*  PROT 7.2 7.2 7.7  ALBUMIN 2.9* 2.8* 3.0*    Recent Labs Lab 03/23/13 0219 03/24/13 0450 03/25/13 0542  AMMONIA 75* 166* 58   CBC:  Recent Labs Lab 03/23/13 0219 03/24/13 0450 03/25/13 0542  WBC 3.9* 3.2* 3.2*  HGB 10.8* 10.9* 11.7*  HCT 32.1* 32.9* 36.3*  MCV 103.9* 105.1* 106.8*  PLT 35* 34* 38*   CBG:  Recent Labs Lab 03/24/13 1227 03/24/13 1713 03/24/13 2137 03/25/13 0743 03/25/13 1219  [place]    Recent Results (from the past 240 hour(s))  MRSA PCR SCREENING     Status: None   Collection Time    03/23/13  1:39 PM      Result Value Range Status   MRSA by PCR NEGATIVE  NEGATIVE Final   Comment:            The GeneXpert MRSA Assay (FDA     approved for NASAL specimens     only), is one component of a     comprehensive MRSA colonization     surveillance program. It is not     intended to diagnose MRSA     infection nor to guide or     monitor treatment for     MRSA infections.     Scheduled Meds: . ferrous sulfate  325 mg Oral QHS  . folic acid  1 mg Oral Daily  . furosemide  40 mg Oral BID  . insulin aspart  0-5 Units Subcutaneous QHS  . insulin aspart  0-9 Units  Subcutaneous TID WC  . insulin aspart  3 Units Subcutaneous TID WC  . insulin glargine  12 Units Subcutaneous QHS  . lactulose  45 g Oral 5 X Daily  . levETIRAcetam  500 mg Oral BID  . levothyroxine  75 mcg Oral q morning - 10a  . LORazepam  0-4 mg Intravenous Q6H   Followed by  . [START ON 03/27/2013] LORazepam  0-4 mg Intravenous Q12H  . multivitamin with minerals  1 tablet Oral Daily  . nadolol  40 mg Oral q morning - 10a  . pantoprazole  40 mg Oral Daily  . potassium chloride  10 mEq Oral q morning - 10a  . rifaximin  550 mg Oral TID  . spironolactone  200 mg Oral q morning - 10a  . thiamine  100 mg Oral Daily   Or  . thiamine  100 mg Intravenous Daily   Continuous Infusions:    Tony Polite, MD  [place] Pager [phone]  If 7PM-7AM, please contact night-coverage [email or web address] Password [email or web address] 03/25/2013, 1:17 PM   LOS: 2 days

## 2013-03-26 LAB — BASIC METABOLIC PANEL
BUN: 15 mg/dL (ref 6–23)
CALCIUM: 8.7 mg/dL (ref 8.4–10.5)
CO2: 29 meq/L (ref 19–32)
CREATININE: 0.69 mg/dL (ref 0.50–1.35)
Chloride: 100 mEq/L (ref 96–112)
GFR calc Af Amer: >90 mL/min (ref >90)
GFR calc non Af Amer: >90 mL/min (ref >90)
Glucose, Bld: 109 mg/dL — ABNORMAL HIGH (ref 70–99)
Potassium: 3.7 mEq/L (ref 3.7–5.3)
Sodium: 138 mEq/L (ref 137–147)

## 2013-03-26 LAB — VITAMIN B12: VITAMIN B 12: 1065 pg/mL — AB (ref 211–911)

## 2013-03-26 LAB — T4, FREE: FREE T4: 0.99 ng/dL (ref 0.80–1.80)

## 2013-03-26 LAB — GLUCOSE, CAPILLARY
GLUCOSE-CAPILLARY: 88 mg/dL (ref 70–99)
Glucose-Capillary: 147 mg/dL — ABNORMAL HIGH (ref 70–99)
Glucose-Capillary: 140 mg/dL — ABNORMAL HIGH (ref 70–99)

## 2013-03-26 LAB — CBC
HCT: 34.4 % — ABNORMAL LOW (ref 39.0–52.0)
Hemoglobin: 11.1 g/dL — ABNORMAL LOW (ref 13.0–17.0)
MCH: 34.4 pg — AB (ref 26.0–34.0)
MCHC: 32.3 g/dL (ref 30.0–36.0)
MCV: 106.5 fL — ABNORMAL HIGH (ref 78.0–100.0)
PLATELETS: 28 10*3/uL — AB (ref 150–400)
RBC: 3.23 MIL/uL — AB (ref 4.22–5.81)
RDW: 15.2 % (ref 11.5–15.5)
WBC: 3.4 10*3/uL — ABNORMAL LOW (ref 4.0–10.5)

## 2013-03-26 LAB — TSH: TSH: 1.553 u[IU]/mL (ref 0.350–4.500)

## 2013-03-26 MED ORDER — LACTULOSE 10 GM/15ML PO SOLN
45.0000 g | Freq: Three times a day (TID) | ORAL | Status: DC
  Administered 2013-03-26 – 2013-03-29 (×10): 45 g via ORAL
  Administered 2013-03-30: 40 g via ORAL
  Administered 2013-03-31 – 2013-04-02 (×4): 45 g via ORAL
  Filled 2013-03-26 (×29): qty 90

## 2013-03-26 MED ORDER — LORAZEPAM 2 MG/ML IJ SOLN: 0.0000 mg | Freq: Four times a day (QID) | INTRAMUSCULAR | Status: DC | PRN

## 2013-03-26 MED ORDER — LORAZEPAM 2 MG/ML IJ SOLN: 2.0000 mg | Freq: Once | INTRAMUSCULAR | Status: DC

## 2013-03-26 NOTE — Progress Notes (Addendum)
Patient ID: Tony Boyd, male   DOB: [birth year], 58 y.o.   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 Ammonia elevated in ED. 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     CXR   03/23/2013  Mild cardiomegaly with increasing mild interstitial prominence which may reflect interstitial edema.    Antibiotics:  None  Code Status: Full Family Communication: No family at bedside , called and d/w daughter yesterday Disposition Plan: Home vs ALF when medically stable  HPI/Subjective: No events overnight, remains confused  Objective: Filed Vitals:   03/25/13 0728 03/25/13 1430 03/25/13 2124 03/26/13 0620  BP:  126/70 123/65 140/80  Pulse:  70 66 68  Temp:  98.1 F (36.7 C) 97.6 F (36.4 C) 98.4 F (36.9 C)  TempSrc:  Oral Oral Oral  Resp:  16 18 20   Height:      Weight: 105.5 kg (232 lb 9.4 oz)      SpO2:  96% 95% 93%    Intake/Output Summary (Last 24 hours) at 03/26/13 1249 Last data filed at 03/26/13 0900  Gross per 24 hour  Intake    720 ml  Output      0 ml  Net    720 ml    Exam:   General:  Pt is alert, awake, but confused  Cardiovascular: Regular rate and rhythm, S1/S2, no murmurs, no rubs, no gallops  Respiratory: Clear to auscultation bilaterally, no wheezing, no crackles, no rhonchi  Abdomen: Soft, non tender, slightly distended, bowel sounds present, no guarding  Extremities: +1 bilateral LE pitting edema, pulses DP and PT palpable bilaterally  Neuro: no asterixes  Data Reviewed: Basic Metabolic Panel:  Recent Labs Lab 03/23/13 0219 03/24/13 0450 03/25/13 0542 03/26/13 0740  NA 136* 139 137 138  K 4.0 4.1 4.2 3.7  CL 95* 102 101 100  CO2 27 27 27 29   GLUCOSE 104* 150* 137* 109*  BUN 23 19 15 15   CREATININE 1.01 0.91 0.76 0.69  CALCIUM 8.6 8.7 9.0 8.7   Liver Function Tests:  Recent Labs Lab 03/23/13 0219 03/24/13 0450 03/25/13 0542  AST 51* 59* 77*  ALT 18 20 25   ALKPHOS 154* 160* 163*  BILITOT 4.3* 3.0* 3.2*  PROT 7.2 7.2 7.7  ALBUMIN 2.9* 2.8* 3.0*    Recent Labs Lab 03/23/13 0219 03/24/13 0450 03/25/13 0542  AMMONIA 75* 166* 58   CBC:  Recent  Labs Lab 03/23/13 0219 03/24/13 0450 03/25/13 0542 03/26/13 0740  WBC 3.9* 3.2* 3.2* 3.4*  HGB 10.8* 10.9* 11.7* 11.1*  HCT 32.1* 32.9* 36.3* 34.4*  MCV 103.9* 105.1* 106.8* 106.5*  PLT 35* 34* 38* 28*   CBG:  Recent Labs Lab 03/25/13 1219 03/25/13 1655 03/25/13 2200 03/26/13 0705 03/26/13 1117  GLUCAP 135* 116* 133* 88 147*    Recent Results (from the past 240 hour(s))  MRSA PCR SCREENING     Status: None   Collection Time    03/23/13  1:39 PM      Result Value Range Status   MRSA by PCR NEGATIVE  NEGATIVE Final   Comment:            The GeneXpert MRSA Assay (FDA     approved for NASAL specimens     only), is one component of a     comprehensive MRSA  colonization     surveillance program. It is not     intended to diagnose MRSA     infection nor to guide or     monitor treatment for     MRSA infections.     Scheduled Meds: . ferrous sulfate  325 mg Oral QHS  . folic acid  1 mg Oral Daily  . furosemide  40 mg Oral BID  . insulin aspart  0-5 Units Subcutaneous QHS  . insulin aspart  0-9 Units Subcutaneous TID WC  . insulin aspart  3 Units Subcutaneous TID WC  . insulin glargine  12 Units Subcutaneous QHS  . lactulose  45 g Oral TID  . levETIRAcetam  500 mg Oral BID  . levothyroxine  75 mcg Oral q morning - 10a  . LORazepam  0-4 mg Intravenous Q6H   Followed by  . [START ON 03/27/2013] LORazepam  0-4 mg Intravenous Q12H  . multivitamin with minerals  1 tablet Oral Daily  . nadolol  40 mg Oral q morning - 10a  . pantoprazole  40 mg Oral Daily  . potassium chloride  10 mEq Oral q morning - 10a  . rifaximin  550 mg Oral TID  . spironolactone  200 mg Oral q morning - 10a  . thiamine  100 mg Oral Daily   Or  . thiamine  100 mg Intravenous Daily   Continuous Infusions:    [name], MD  [place] Pager [phone]  If 7PM-7AM, please contact night-coverage [email or web address] Password [email or web address] 03/26/2013, 12:49 PM   LOS: 3 days

## 2013-03-26 NOTE — Progress Notes (Signed)
Physical Therapy Treatment Patient Details Name: Salome Cozby MRN: [id number] DOB: [birth year] Today's Date: 03/26/2013 Time: 1540-0867 PT Time Calculation (min): 18 min  PT Assessment / Plan / Recommendation  History of Present Illness 58 yo male admitted with hepatic encephalopathy. Hx of bipolar d/o, Hep C, alcoholic cirrhosis, seizure, frequent admissions   PT Comments   LOB x 1 posteriorly requiring Min assist to prevent fall. Pt remains very confused. Rambling, nonsensical  conversation  Follow Up Recommendations  SNF;Supervision/Assistance - 24 hour     Does the patient have the potential to tolerate intense rehabilitation     Barriers to Discharge        Equipment Recommendations  None recommended by PT    Recommendations for Other Services OT consult  Frequency Min 3X/week   Progress towards PT Goals Progress towards PT goals: Progressing toward goals (however pt remains confused)  Plan Current plan remains appropriate    Precautions / Restrictions Precautions Precautions: Fall Restrictions Weight Bearing Restrictions: No   Pertinent Vitals/Pain No c/o pain this session    Mobility  Bed Mobility Overal bed mobility: Needs Assistance Bed Mobility: Supine to Sit;Sit to Supine Supine to sit: Supervision Sit to supine: Supervision Transfers Overall transfer level: Needs assistance Equipment used: Rolling walker (2 wheeled) Transfers: Sit to/from Stand Sit to Stand: Min guard General transfer comment: close guard for safety. vcs hand placement Ambulation/Gait Ambulation/Gait assistance: Min assist Ambulation Distance (Feet): 135 Feet Assistive device: Rolling walker (2 wheeled) General Gait Details: Assist to stabilize pt and maneuver with walker. Pt with decreased attention to task-bumping into objects in environment. LOB x 1 posteriorly    Exercises     PT Diagnosis:    PT Problem List:   PT Treatment Interventions:     PT Goals (current goals can now  be found in the care plan section)    Visit Information  Last PT Received On: 03/26/13 Assistance Needed: +1 History of Present Illness: 58 yo male admitted with hepatic encephalopathy. Hx of bipolar d/o, Hep C, alcoholic cirrhosis, seizure, frequent admissions    Subjective Data      Cognition  Cognition Arousal/Alertness: Awake/alert Behavior During Therapy: WFL for tasks assessed/performed Overall Cognitive Status: Impaired/Different from baseline Area of Impairment: Orientation;Attention;Memory;Safety/judgement;Problem solving Orientation Level: Disoriented to;Place;Time;Situation Current Attention Level: Focused Memory: Decreased short-term memory;Decreased recall of precautions Safety/Judgement: Decreased awareness of safety;Decreased awareness of deficits Problem Solving: Requires tactile cues;Requires verbal cues    Balance  Balance Overall balance assessment: Needs assistance Sitting-balance support: Bilateral upper extremity supported Sitting balance-Leahy Scale: Good Standing balance support: Bilateral upper extremity supported;During functional activity Standing balance-Leahy Scale: Poor  End of Session PT - End of Session Equipment Utilized During Treatment: Gait belt Activity Tolerance: Patient tolerated treatment well Patient left: in bed;with call bell/phone within reach   GP     Weston Anna, MPT Pager: [phone]

## 2013-03-26 NOTE — Progress Notes (Signed)
CRITICAL VALUE ALERT  Critical value received:  Platelets 28   Date of notification:  03/27/13  Time of notification:  0828  Critical value read back:yes  Nurse who received alert:  Graciela Husbands, RN  MD notified (1st page):  Dr. Broadus John  Time of first page:  0828  MD notified (2nd page):  Time of second page:  Responding MD:  Broadus John  Time MD responded:  [phone]  *No new orders given.  This is comparable to patient's baseline

## 2013-03-26 NOTE — Progress Notes (Signed)
CSW called both daughters to discuss possible snf placement. [name] had no room on her voicemail. CSW left voicemail for blake.  Emine Lopata C. [place] MSW, [place]

## 2013-03-26 NOTE — Care Management Note (Signed)
    Page 1 of 2   03/31/2013     11:25:14 AM   CARE MANAGEMENT NOTE 03/31/2013  Patient:  RINGO, SHEROD   Account Number:  [id number]  Date Initiated:  03/26/2013  Documentation initiated by:  [facility]  Subjective/Objective Assessment:   58 year old male admitted with hepatic encephalopathy.     Action/Plan:   [facility]ro independent living, PT is recommending SNF.   Anticipated DC Date:  03/31/2013   Anticipated DC Plan:  [place]  In-house referral  Clinical Social Worker      DC Planning Services  CM consult      Choice offered to / List presented to:          [facility] arranged  HH-1 RN  [place]      [place] agency  [place]   Status of service:  In process, will continue to follow Medicare Important Message given?  NA - LOS <3 / Initial given by admissions (If response is "NO", the following Medicare IM given date fields will be blank) Date Medicare IM given:   Date Additional Medicare IM given:    Discharge Disposition:    Per UR Regulation:  Reviewed for med. necessity/level of care/duration of stay  If discussed at [place] of Stay Meetings, dates discussed:    Comments:  03/31/13 Allene Dillon RN BSN Met with pt at the bedside to discuss d/c needs. PT has cleared him to d/c home with [facility] services. He has used [facility] in the past and wishes to use them again. Referral has been made to them. Pt aslo informed me that he has been waiting to hear from [facility] for 6 months. I called them and spoke with Jola Baptist Intake Coordinator. She informed me that a letter was sent out to pt and his [place] in December because they have not been responding to their phone calls. She asked me to have pt and POA call her at [phone]. I attemted to call [name] but did not get an answer and I couldn't leave a VM. I lefta VM for [name], pt's other daughter who call me back. I informed of d/c  planned for today and also requested her to have [name] call me back.

## 2013-03-27 DIAGNOSIS — R188 Other ascites: Secondary | ICD-10-CM

## 2013-03-27 LAB — COMPREHENSIVE METABOLIC PANEL
ALT: 25 U/L (ref 0–53)
AST: 64 U/L — AB (ref 0–37)
Albumin: 2.9 g/dL — ABNORMAL LOW (ref 3.5–5.2)
Alkaline Phosphatase: 152 U/L — ABNORMAL HIGH (ref 39–117)
BUN: 18 mg/dL (ref 6–23)
CALCIUM: 8.6 mg/dL (ref 8.4–10.5)
CO2: 25 meq/L (ref 19–32)
CREATININE: 0.75 mg/dL (ref 0.50–1.35)
Chloride: 102 mEq/L (ref 96–112)
GFR calc non Af Amer: >90 mL/min (ref >90)
Glucose, Bld: 102 mg/dL — ABNORMAL HIGH (ref 70–99)
Potassium: 4 mEq/L (ref 3.7–5.3)
Sodium: 138 mEq/L (ref 137–147)
TOTAL PROTEIN: 7.4 g/dL (ref 6.0–8.3)
Total Bilirubin: 3.9 mg/dL — ABNORMAL HIGH (ref 0.3–1.2)

## 2013-03-27 LAB — GLUCOSE, CAPILLARY
GLUCOSE-CAPILLARY: 87 mg/dL (ref 70–99)
Glucose-Capillary: 107 mg/dL — ABNORMAL HIGH (ref 70–99)
Glucose-Capillary: 107 mg/dL — ABNORMAL HIGH (ref 70–99)
Glucose-Capillary: 126 mg/dL — ABNORMAL HIGH (ref 70–99)
Glucose-Capillary: 125 mg/dL — ABNORMAL HIGH (ref 70–99)

## 2013-03-27 MED ORDER — OXYCODONE HCL 5 MG PO TABS
5.0000 mg | ORAL_TABLET | Freq: Four times a day (QID) | ORAL | Status: DC | PRN
  Administered 2013-03-27 – 2013-04-02 (×21): 5 mg via ORAL
  Filled 2013-03-27 (×22): qty 1

## 2013-03-27 NOTE — Progress Notes (Signed)
Patient ID: Tony Boyd, male   DOB: 28-May-1955, 58 y.o.   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 Ammonia elevated in ED. 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     CXR   03/23/2013  Mild cardiomegaly with increasing mild interstitial prominence which may reflect interstitial edema.    Antibiotics:  None  Code Status: Full Family Communication: No family at bedside , called and d/w daughter yesterday, i left msg for daughter blake, other daughter unable to leave voicemail Disposition Plan: needs SNF early next week  HPI/Subjective: No events overnight, still with confusion, but improved from before  Objective: Filed Vitals:   03/26/13 0800 03/26/13 1317 03/26/13  2246 03/27/13 1309  BP:  129/75 115/78 127/58  Pulse:  72 73 66  Temp:  98 F (36.7 C) 98.1 F (36.7 C) 98.1 F (36.7 C)  TempSrc:  Oral Oral Oral  Resp:  18 18 20   Height:      Weight: 107 kg (235 lb 14.3 oz)     SpO2:  95% 98% 94%    Intake/Output Summary (Last 24 hours) at 03/27/13 1318 Last data filed at 03/27/13 1105  Gross per 24 hour  Intake    240 ml  Output    400 ml  Net   -160 ml    Exam:   General:  Pt is alert, awake, but confused  Cardiovascular: Regular rate and rhythm, S1/S2, no murmurs, no rubs, no gallops  Respiratory: Clear to auscultation bilaterally, no wheezing, no crackles, no rhonchi  Abdomen: Soft, non tender, slightly distended, bowel sounds present, no guarding  Extremities: +1 bilateral LE pitting edema, pulses DP and PT palpable bilaterally  Neuro: no asterixes  Data Reviewed: Basic Metabolic Panel:  Recent Labs Lab 03/23/13 0219 03/24/13 0450 03/25/13 0542 03/26/13 0740 03/27/13 0451  NA 136* 139 137 138 138  K 4.0 4.1 4.2 3.7 4.0  CL 95* 102 101 100 102  CO2 27 27 27 29 25   GLUCOSE 104* 150* 137* 109* 102*  BUN 23 19 15 15 18   CREATININE 1.01 0.91 0.76 0.69 0.75  CALCIUM 8.6 8.7 9.0 8.7 8.6   Liver Function Tests:  Recent Labs Lab 03/23/13 0219 03/24/13 0450 03/25/13 0542 03/27/13 0451  AST 51* 59* 77* 64*  ALT 18 20 25 25   ALKPHOS 154* 160* 163* 152*  BILITOT 4.3* 3.0* 3.2* 3.9*  PROT 7.2 7.2 7.7 7.4  ALBUMIN 2.9* 2.8* 3.0* 2.9*    Recent Labs Lab 03/23/13 0219 03/24/13 0450 03/25/13 0542  AMMONIA 75* 166* 58   CBC:  Recent Labs Lab 03/23/13 0219 03/24/13 0450 03/25/13 0542 03/26/13 0740  WBC 3.9* 3.2* 3.2* 3.4*  HGB 10.8* 10.9* 11.7* 11.1*  HCT 32.1* 32.9* 36.3* 34.4*  MCV 103.9* 105.1* 106.8* 106.5*  PLT 35* 34* 38* 28*   CBG:  Recent Labs Lab 03/26/13 1117 03/26/13 1632 03/26/13 2150 03/27/13 0712 03/27/13 1058  GLUCAP 147* 140* 107* 107* 87    Recent Results (from the past 240  hour(s))  MRSA PCR SCREENING     Status: None   Collection Time    03/23/13  1:39 PM      Result Value Range Status   MRSA by PCR NEGATIVE  NEGATIVE Final   Comment:            The GeneXpert MRSA Assay (FDA     approved for NASAL specimens     only), is one component of a     comprehensive MRSA colonization     surveillance program. It is not     intended to diagnose MRSA     infection nor to guide or     monitor treatment for     MRSA infections.     Scheduled Meds: . ferrous sulfate  325 mg Oral QHS  . folic acid  1 mg Oral Daily  . furosemide  40 mg Oral BID  . insulin aspart  0-5 Units Subcutaneous QHS  . insulin aspart  0-9 Units Subcutaneous TID WC  . insulin aspart  3 Units Subcutaneous TID WC  . insulin glargine  12 Units Subcutaneous QHS  . lactulose  45 g Oral TID  . levETIRAcetam  500 mg Oral BID  . levothyroxine  75 mcg Oral q morning - 10a  . LORazepam  2 mg Intramuscular Once  . multivitamin with minerals  1 tablet Oral Daily  . nadolol  40 mg Oral q morning - 10a  . pantoprazole  40 mg Oral Daily  . potassium chloride  10 mEq Oral q morning - 10a  . rifaximin  550 mg Oral TID  . spironolactone  200 mg Oral q morning - 10a  . thiamine  100 mg Oral Daily   Or  . thiamine  100 mg Intravenous Daily   Continuous Infusions:    [name], MD  [place] Pager [phone]  If 7PM-7AM, please contact night-coverage [email or web address] Password [email or web address] 03/27/2013, 1:18 PM   LOS: 4 days

## 2013-03-27 NOTE — Clinical Social Work Psychosocial (Signed)
     Clinical Social Work Department BRIEF PSYCHOSOCIAL ASSESSMENT 03/27/2013  Patient:  NIL, XIONG     Account Number:  [id number]     Admit date:  03/23/2013  Clinical Social Worker:  Venia Minks  Date/Time:  03/27/2013 12:00 M  Referred by:  Physician  Date Referred:  03/27/2013 Referred for  SNF Placement   Other Referral:   Interview type:  Family Other interview type:    PSYCHOSOCIAL DATA Living Status:  FACILITY Admitted from facility:   Level of care:  Independent Living Primary support name:  [name] Primary support relationship to patient:  CHILD, ADULT Degree of support available:   fair    CURRENT CONCERNS Current Concerns  Post-Acute Placement   Other Concerns:    SOCIAL WORK ASSESSMENT / PLAN CSW met with patient. patient is not oriented and can not participate in assessment. CSW was able to reach patient's daughter, [name], she states that patient has previously been to snf several times and she understands that he is being recommended to go again. She states that he previously had a good experience at [facility] and would like him to go back there if possible. She reiterated that this was for short term and she intends to keep his apartment up so he can return to it.   Assessment/plan status:   Other assessment/ plan:   Information/referral to community resources:    PATIENTS/FAMILYS RESPONSE TO PLAN OF CARE: daughter is understanding that patient needs snf. She states that patient has been back and forth several times and it has become slightly frustrating. CSW offered emotional support. CSW put in for a 30 day note passar.

## 2013-03-27 NOTE — Progress Notes (Signed)
CSW again left voicemails with patient's daughters. No return call at this time.  Maya Arcand C. [place] MSW, [place]

## 2013-03-27 NOTE — Progress Notes (Signed)
CSW has been unable to get in touch with family. No family at bedside. RN to call CSW if family shows up. CSW spoke with Orrin Brigham from [facility] care management, she reports that they had difficulties getting in touch with family as well.  Metztli Sachdev C. [place] MSW, [place]

## 2013-03-27 NOTE — Progress Notes (Signed)
CSW received call back from patient's daughter, phone number [phone], she would like patient to go to [facility] when medically stable.  Gail Vendetti C. [place] MSW, [place]

## 2013-03-28 LAB — GLUCOSE, CAPILLARY
GLUCOSE-CAPILLARY: 101 mg/dL — AB (ref 70–99)
GLUCOSE-CAPILLARY: 121 mg/dL — AB (ref 70–99)
Glucose-Capillary: 152 mg/dL — ABNORMAL HIGH (ref 70–99)

## 2013-03-28 NOTE — Progress Notes (Signed)
Patient ID: Tony Boyd, male   DOB: Jun 08, 1955, 58 y.o.   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 Ammonia elevated in ED. 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     CXR   03/23/2013  Mild cardiomegaly with increasing mild interstitial prominence which may reflect interstitial edema.    Antibiotics:  None  Code Status: Full Family Communication: No family at bedside , called and d/w daughter yesterday, i left msg for daughter Tony Boyd, other daughter unable to leave voicemail Disposition Plan: needs SNF early next week  HPI/Subjective: Mentation much improved, walking in halls today  Objective: Filed Vitals:   03/27/13 1309 03/27/13 2239 03/28/13 0653 03/28/13 1420  BP:  127/58 125/81 99/51 108/61  Pulse: 66 65 59 62  Temp: 98.1 F (36.7 C) 98 F (36.7 C) 97.3 F (36.3 C) 98.1 F (36.7 C)  TempSrc: Oral Oral Axillary Oral  Resp: 20 18  16   Height:      Weight:   101.1 kg (222 lb 14.2 oz)   SpO2: 94% 97% 94% 96%    Intake/Output Summary (Last 24 hours) at 03/28/13 1741 Last data filed at 03/28/13 1323  Gross per 24 hour  Intake    480 ml  Output      0 ml  Net    480 ml    Exam:   General:  Pt is alert, awake, oriented to self and place, much more lucid than before  Cardiovascular: Regular rate and rhythm, S1/S2, no murmurs, no rubs, no gallops  Respiratory: Clear to auscultation bilaterally, no wheezing, no crackles, no rhonchi  Abdomen: Soft, non tender, slightly distended, bowel sounds present, no guarding  Extremities: trace pitting edema, pulses DP and PT palpable bilaterally  Neuro: no asterixes  Data Reviewed: Basic Metabolic Panel:  Recent Labs Lab 03/23/13 0219 03/24/13 0450 03/25/13 0542 03/26/13 0740 03/27/13 0451  NA 136* 139 137 138 138  K 4.0 4.1 4.2 3.7 4.0  CL 95* 102 101 100 102  CO2 27 27 27 29 25   GLUCOSE 104* 150* 137* 109* 102*  BUN 23 19 15 15 18   CREATININE 1.01 0.91 0.76 0.69 0.75  CALCIUM 8.6 8.7 9.0 8.7 8.6   Liver Function Tests:  Recent Labs Lab 03/23/13 0219 03/24/13 0450 03/25/13 0542  03/27/13 0451  AST 51* 59* 77* 64*  ALT 18 20 25 25   ALKPHOS 154* 160* 163* 152*  BILITOT 4.3* 3.0* 3.2* 3.9*  PROT 7.2 7.2 7.7 7.4  ALBUMIN 2.9* 2.8* 3.0* 2.9*    Recent Labs Lab 03/23/13 0219 03/24/13 0450 03/25/13 0542  AMMONIA 75* 166* 58   CBC:  Recent Labs Lab 03/23/13 0219 03/24/13 0450 03/25/13 0542 03/26/13 0740  WBC 3.9* 3.2* 3.2* 3.4*  HGB 10.8* 10.9* 11.7* 11.1*  HCT 32.1* 32.9* 36.3* 34.4*  MCV 103.9* 105.1* 106.8* 106.5*  PLT 35* 34* 38* 28*   CBG:  Recent Labs Lab 03/27/13 1058 03/27/13 1627 03/27/13 2026 03/28/13 0740 03/28/13 1146  GLUCAP 87 126* 125* 101*  121*    Recent Results (from the past 240 hour(s))  MRSA PCR SCREENING     Status: None   Collection Time    03/23/13  1:39 PM      Result Value Range Status   MRSA by PCR NEGATIVE  NEGATIVE Final   Comment:            The GeneXpert MRSA Assay (FDA     approved for NASAL specimens     only), is one component of a     comprehensive MRSA colonization     surveillance program. It is not     intended to diagnose MRSA     infection nor to guide or     monitor treatment for     MRSA infections.     Scheduled Meds: . ferrous sulfate  325 mg Oral QHS  . folic acid  1 mg Oral Daily  . furosemide  40 mg Oral BID  . insulin aspart  0-5 Units Subcutaneous QHS  . insulin aspart  0-9 Units Subcutaneous TID WC  . insulin aspart  3 Units Subcutaneous TID WC  . insulin glargine  12 Units Subcutaneous QHS  . lactulose  45 g Oral TID  . levETIRAcetam  500 mg Oral BID  . levothyroxine  75 mcg Oral q morning - 10a  . multivitamin with minerals  1 tablet Oral Daily  . nadolol  40 mg Oral q morning - 10a  . pantoprazole  40 mg Oral Daily  . potassium chloride  10 mEq Oral q morning - 10a  . rifaximin  550 mg Oral TID  . spironolactone  200 mg Oral q morning - 10a  . thiamine  100 mg Oral Daily   Or  . thiamine  100 mg Intravenous Daily   Continuous Infusions:    Tony Polite, MD  [place] Pager [phone]  If 7PM-7AM, please contact night-coverage [email or web address] Password [email or web address] 03/28/2013, 5:41 PM   LOS: 5 days

## 2013-03-29 LAB — GLUCOSE, CAPILLARY
GLUCOSE-CAPILLARY: 211 mg/dL — AB (ref 70–99)
GLUCOSE-CAPILLARY: 135 mg/dL — AB (ref 70–99)
GLUCOSE-CAPILLARY: 107 mg/dL — AB (ref 70–99)
Glucose-Capillary: 146 mg/dL — ABNORMAL HIGH (ref 70–99)

## 2013-03-29 NOTE — Progress Notes (Addendum)
Patient ID: Tony Boyd, male   DOB: 09-21-1955, 58 y.o.   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 Ammonia elevated in ED    Hepatic encephalopathy - ammonia improved  - mentation improved but still with some confusion at times - continue lactulose and rifaximin  - Stopped CIWA protocol, since this is day 4 of hospitalization and do not expect withdrawal at this time - also suspect a component of ETOH induced cognitive dysfunction or early dementia - TSH, B12 normal - cut down oxycodone - frequent reorientation, lights on during day and lights off at night - requires 24x7 supervision at discharge, d/w daughter at length on 2 occasions - cognitively improved, but i dont think he is competent at this point to make decisions safely on his behalf   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     CXR   03/23/2013  Mild cardiomegaly with increasing mild interstitial prominence which may reflect interstitial edema.    Antibiotics:  None  Code Status: Full Family Communication: No family at bedside , called and d/w daughter yesterday, i left msg for daughter blake, other daughter unable to leave voicemail Disposition Plan: pending SNF  HPI/Subjective: Mentation stable, still with intermittent confusion,  walking in halls  Objective: Filed Vitals:   03/28/13 1420 03/28/13 2214 03/29/13 0634 03/29/13 1200  BP: 108/61 111/68 107/57 110/64  Pulse: 62 74 68 75  Temp: 98.1 F (36.7 C) 97.8 F (36.6 C) 98.2 F (36.8 C)   TempSrc: Oral Axillary Oral   Resp: 16 20 18    Height:      Weight:   101.3 kg (223 lb 5.2 oz)   SpO2: 96% 98% 95%     Intake/Output Summary (Last 24 hours) at 03/29/13 1309 Last data filed at 03/29/13 0830  Gross per 24 hour  Intake    840 ml  Output      0 ml  Net    840 ml    Exam:   General:  Pt is alert, awake, oriented to self and place, much more lucid than before  Cardiovascular: Regular rate and rhythm, S1/S2, no murmurs, no rubs, no gallops  Respiratory: Clear to auscultation bilaterally, no wheezing, no crackles, no rhonchi  Abdomen: Soft, non tender, slightly distended, bowel sounds present, no guarding  Extremities: trace pitting edema, pulses DP and PT palpable bilaterally  Neuro: no asterixes  Data Reviewed: Basic Metabolic Panel:  Recent Labs Lab 03/23/13 0219 03/24/13 0450 03/25/13 0542 03/26/13 0740 03/27/13 0451  NA 136* 139 137 138 138  K 4.0 4.1 4.2 3.7 4.0  CL 95* 102 101 100 102  CO2 27 27 27 29 25   GLUCOSE 104* 150* 137* 109* 102*  BUN 23 19 15 15 18   CREATININE 1.01 0.91 0.76 0.69 0.75  CALCIUM 8.6 8.7 9.0 8.7 8.6  Liver Function Tests:  Recent Labs Lab 03/23/13 0219 03/24/13 0450 03/25/13 0542 03/27/13 0451  AST 51* 59* 77* 64*  ALT 18 20 25 25   ALKPHOS 154* 160* 163* 152*  BILITOT 4.3* 3.0* 3.2* 3.9*  PROT 7.2 7.2 7.7 7.4  ALBUMIN 2.9* 2.8* 3.0* 2.9*    Recent Labs Lab 03/23/13 0219 03/24/13 0450 03/25/13 0542  AMMONIA 75* 166* 58   CBC:  Recent Labs Lab 03/23/13 0219 03/24/13 0450 03/25/13 0542 03/26/13 0740  WBC 3.9* 3.2* 3.2* 3.4*  HGB 10.8* 10.9* 11.7* 11.1*  HCT 32.1* 32.9* 36.3* 34.4*  MCV 103.9* 105.1* 106.8* 106.5*  PLT 35* 34* 38* 28*   CBG:  Recent Labs Lab  03/28/13 0740 03/28/13 1146 03/28/13 1723 03/28/13 2207 03/29/13 0735  GLUCAP 101* 121* 152* 211* 135*    Recent Results (from the past 240 hour(s))  MRSA PCR SCREENING     Status: None   Collection Time    03/23/13  1:39 PM      Result Value Range Status   MRSA by PCR NEGATIVE  NEGATIVE Final   Comment:            The GeneXpert MRSA Assay (FDA     approved for NASAL specimens     only), is one component of a     comprehensive MRSA colonization     surveillance program. It is not     intended to diagnose MRSA     infection nor to guide or     monitor treatment for     MRSA infections.     Scheduled Meds: . ferrous sulfate  325 mg Oral QHS  . folic acid  1 mg Oral Daily  . furosemide  40 mg Oral BID  . insulin aspart  0-5 Units Subcutaneous QHS  . insulin aspart  0-9 Units Subcutaneous TID WC  . insulin aspart  3 Units Subcutaneous TID WC  . insulin glargine  12 Units Subcutaneous QHS  . lactulose  45 g Oral TID  . levETIRAcetam  500 mg Oral BID  . levothyroxine  75 mcg Oral q morning - 10a  . multivitamin with minerals  1 tablet Oral Daily  . nadolol  40 mg Oral q morning - 10a  . pantoprazole  40 mg Oral Daily  . potassium chloride  10 mEq Oral q morning - 10a  . rifaximin  550 mg Oral TID  . spironolactone  200 mg Oral q morning - 10a  . thiamine  100 mg Oral Daily   Or  . thiamine  100 mg Intravenous Daily   Continuous Infusions:    [name], MD  [place] Pager [phone]  If 7PM-7AM, please contact night-coverage [email or web address] Password [email or web address] 03/29/2013, 1:09 PM   LOS: 6 days

## 2013-03-30 LAB — GLUCOSE, CAPILLARY
Glucose-Capillary: 110 mg/dL — ABNORMAL HIGH (ref 70–99)
Glucose-Capillary: 168 mg/dL — ABNORMAL HIGH (ref 70–99)
Glucose-Capillary: 146 mg/dL — ABNORMAL HIGH (ref 70–99)
Glucose-Capillary: 153 mg/dL — ABNORMAL HIGH (ref 70–99)

## 2013-03-30 NOTE — Progress Notes (Signed)
Physical Therapy Treatment Patient Details Name: Tony Boyd MRN: [id number] DOB: May 02, 1955 Today's Date: 03/30/2013 Time: 1518-3437 PT Time Calculation (min): 12 min  PT Assessment / Plan / Recommendation  History of Present Illness 58 yo male admitted with hepatic encephalopathy. Hx of bipolar d/o, Hep C, alcoholic cirrhosis, seizure, frequent admissions   PT Comments   Pt reports he has been up ambulating in hallway and using RW for safety.  Pt reports he has RW close by at home if needed so ambulated with and without RW today and no unsteadiness or LOB observed.  Pt has met all acute PT goals and cognition appears to be much improved today compared to last visit.  Pt would like to d/c home.   Follow Up Recommendations  Home health PT;Supervision for mobility/OOB     Does the patient have the potential to tolerate intense rehabilitation     Barriers to Discharge        Equipment Recommendations  None recommended by PT    Recommendations for Other Services    Frequency     Progress towards PT Goals Progress towards PT goals: Goals met/education completed, patient discharged from PT  Plan Other (comment) (d/c from PT)    Precautions / Restrictions Precautions Precautions: Fall Restrictions Weight Bearing Restrictions: No   Pertinent Vitals/Pain n/a    Mobility  Bed Mobility Overal bed mobility: Modified Independent Transfers Overall transfer level: Modified independent Ambulation/Gait Ambulation/Gait assistance: Supervision;Modified independent (Device/Increase time) Ambulation Distance (Feet): 400 Feet Assistive device: Rolling walker (2 wheeled);None Gait Pattern/deviations: WFL(Within Functional Limits) Gait velocity: WFL General Gait Details: ambulated halfway with RW and then no assistive device for 200 feet, no unsteadiness or LOB observed    Exercises     PT Diagnosis:    PT Problem List:   PT Treatment Interventions:     PT Goals (current goals can now  be found in the care plan section)    Visit Information  Last PT Received On: 03/30/13 Assistance Needed: +1 History of Present Illness: 58 yo male admitted with hepatic encephalopathy. Hx of bipolar d/o, Hep C, alcoholic cirrhosis, seizure, frequent admissions    Subjective Data      Cognition  Cognition Arousal/Alertness: Awake/alert Behavior During Therapy: WFL for tasks assessed/performed Overall Cognitive Status: Within Functional Limits for tasks assessed    Balance     End of Session PT - End of Session Activity Tolerance: Patient tolerated treatment well Patient left: in bed;with call bell/phone within reach;with bed alarm set   GP     Mindi Akerson,KATHrine E 03/30/2013, 1:39 PM Carmelia Bake, PT, DPT 03/30/2013 Pager: [phone]

## 2013-03-30 NOTE — Progress Notes (Addendum)
Patient ID: Tony Boyd, male   DOB: 11-Apr-1955, 58 y.o.   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 Ammonia elevated in ED    Hepatic encephalopathy - ammonia improved  - mentation improved but still with some confusion at times - continue lactulose and rifaximin  - Stopped CIWA protocol, since this is day 4 of hospitalization and do not expect withdrawal at this time - also suspect a component of ETOH induced cognitive dysfunction or early dementia - TSH, B12 normal - cut down oxycodone - frequent reorientation, lights on during day and lights off at night - requires 24x7 supervision at discharge, d/w daughter at length on 2 occasions -DC sitter - cognitively improving, if continues to do so, may need to re-evaluate disposition -ALF ideal   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     CXR   03/23/2013  Mild cardiomegaly with increasing mild interstitial prominence which may reflect interstitial edema.    Antibiotics:  None  Code Status: Full Family Communication: No family at bedside , called and d/w daughter yesterday, i left msg for daughter blake, other daughter unable to leave voicemail Disposition Plan: pending SNF  HPI/Subjective: Mentation stable, still with intermittent confusion, walking  in halls  Objective: Filed Vitals:   03/29/13 1358 03/30/13 1057 03/30/13 1321 03/30/13 1352  BP: 111/63 117/73 100/54   Pulse: 69 61 67 60  Temp: 98.2 F (36.8 C)  97.9 F (36.6 C) 98.2 F (36.8 C)  TempSrc: Oral  Oral Oral  Resp: 18  20 14   Height:      Weight:      SpO2: 95%  97% 98%    Intake/Output Summary (Last 24 hours) at 03/30/13 2055 Last data filed at 03/30/13 1242  Gross per 24 hour  Intake    480 ml  Output      0 ml  Net    480 ml    Exam:   General:  Pt is alert, awake, oriented to self and place, much more lucid than before  Cardiovascular: Regular rate and rhythm, S1/S2, no murmurs, no rubs, no gallops  Respiratory: Clear to auscultation bilaterally, no wheezing, no crackles, no rhonchi  Abdomen: Soft, non tender, slightly distended, bowel sounds present, no guarding  Extremities: trace pitting edema, pulses DP and PT palpable bilaterally  Neuro: no asterixes  Data Reviewed: Basic Metabolic Panel:  Recent Labs Lab 03/24/13 0450 03/25/13 0542 03/26/13 0740 03/27/13 0451  NA 139 137 138 138  K 4.1 4.2 3.7 4.0  CL 102 101 100 102  CO2 27 27 29 25   GLUCOSE 150* 137* 109* 102*  BUN 19 15 15 18   CREATININE 0.91 0.76 0.69 0.75  CALCIUM 8.7 9.0 8.7 8.6   Liver Function Tests:  Recent Labs Lab 03/24/13 0450 03/25/13 0542 03/27/13 0451  AST 59* 77*  64*  ALT 20 25 25   ALKPHOS 160* 163* 152*  BILITOT 3.0* 3.2* 3.9*  PROT 7.2 7.7 7.4  ALBUMIN 2.8* 3.0* 2.9*    Recent Labs Lab 03/24/13 0450 03/25/13 0542  AMMONIA 166* 58   CBC:  Recent Labs Lab 03/24/13 0450 03/25/13 0542 03/26/13 0740  WBC 3.2* 3.2* 3.4*  HGB 10.9* 11.7* 11.1*  HCT 32.9* 36.3* 34.4*  MCV 105.1* 106.8* 106.5*  PLT 34* 38* 28*   CBG:  Recent Labs Lab 03/29/13 1701 03/29/13 2100 03/30/13 0722 03/30/13 1123 03/30/13 1621  GLUCAP 146* 107* 110* 168* 146*    Recent Results (from the past 240 hour(s))  MRSA PCR SCREENING     Status: None    Collection Time    03/23/13  1:39 PM      Result Value Range Status   MRSA by PCR NEGATIVE  NEGATIVE Final   Comment:            The GeneXpert MRSA Assay (FDA     approved for NASAL specimens     only), is one component of a     comprehensive MRSA colonization     surveillance program. It is not     intended to diagnose MRSA     infection nor to guide or     monitor treatment for     MRSA infections.     Scheduled Meds: . ferrous sulfate  325 mg Oral QHS  . folic acid  1 mg Oral Daily  . furosemide  40 mg Oral BID  . insulin aspart  0-5 Units Subcutaneous QHS  . insulin aspart  0-9 Units Subcutaneous TID WC  . insulin aspart  3 Units Subcutaneous TID WC  . insulin glargine  12 Units Subcutaneous QHS  . lactulose  45 g Oral TID  . levETIRAcetam  500 mg Oral BID  . levothyroxine  75 mcg Oral q morning - 10a  . multivitamin with minerals  1 tablet Oral Daily  . nadolol  40 mg Oral q morning - 10a  . pantoprazole  40 mg Oral Daily  . potassium chloride  10 mEq Oral q morning - 10a  . rifaximin  550 mg Oral TID  . spironolactone  200 mg Oral q morning - 10a  . thiamine  100 mg Oral Daily   Continuous Infusions:    [name], MD  [facility] Pager [phone]  If 7PM-7AM, please contact night-coverage [email or web address] Password [email or web address] 03/30/2013, 8:55 PM   LOS: 7 days

## 2013-03-30 NOTE — Progress Notes (Signed)
Patient was referred for a level 2 passar.  Tony Boyd [name] MSW, [place]

## 2013-03-31 LAB — GLUCOSE, CAPILLARY
GLUCOSE-CAPILLARY: 135 mg/dL — AB (ref 70–99)
GLUCOSE-CAPILLARY: 120 mg/dL — AB (ref 70–99)
Glucose-Capillary: 106 mg/dL — ABNORMAL HIGH (ref 70–99)
Glucose-Capillary: 155 mg/dL — ABNORMAL HIGH (ref 70–99)

## 2013-03-31 MED ORDER — OXYCODONE HCL 5 MG PO TABS: 5.0000 mg | ORAL_TABLET | Freq: Four times a day (QID) | ORAL | Status: DC | PRN

## 2013-03-31 MED ORDER — LACTULOSE 10 GM/15ML PO SOLN: 45.0000 g | Freq: Three times a day (TID) | ORAL | Status: DC

## 2013-03-31 NOTE — Progress Notes (Signed)
Patient has decided to go home instead of snf. No further CSW needs noted. CSW signing off. Please reconsult as needed.  Catheryne Deford C. [place] MSW, [place]

## 2013-03-31 NOTE — Clinical Social Work Placement (Signed)
     Clinical Social Work Department CLINICAL SOCIAL WORK PLACEMENT NOTE 03/31/2013  Patient:  Tony Boyd, Tony Boyd  Account Number:  [id number] Admit date:  03/23/2013  Clinical Social Worker:  Caren Hazy, LCSW  Date/time:  03/31/2013 12:00 M  Clinical Social Work is seeking post-discharge placement for this patient at the following level of care:   [facility]   (*CSW will update this form in Epic as items are completed)   03/27/2013  Patient/family provided with [place] Department of Clinical Social Works list of facilities offering this level of care within the geographic area requested by the patient (or if unable, by the patients family).  03/27/2013  Patient/family informed of their freedom to choose among providers that offer the needed level of care, that participate in Medicare, Medicaid or managed care program needed by the patient, have an available bed and are willing to accept the patient.  03/27/2013  Patient/family informed of [facility] ownership interest in [facility], as well as of the fact that they are under no obligation to receive care at this facility.  PASARR submitted to [facility]DS on 03/27/2013 PASARR number received from [employer] on   FL2 transmitted to all facilities in geographic area requested by pt/family on  03/27/2013 FL2 transmitted to all facilities within larger geographic area on   Patient informed that his/her managed care company has contracts with or will negotiate with  certain facilities, including the following:     Patient/family informed of bed offers received:   Patient chooses bed at  Physician recommends and patient chooses bed at    Patient to be transferred to  on   Patient to be transferred to facility by   The following physician request were entered in Epic:   Additional Comments: patient decided to discharge home.

## 2013-03-31 NOTE — Progress Notes (Signed)
Caren Griffins Charge RN spoke with [name], patients daughter.  Pt daughter reports that she or her sister will pick up patient between 4:30 and 5:00 today.

## 2013-03-31 NOTE — Progress Notes (Signed)
Pt attempted to call his daughters again but no answer.  Pt is discharge.  Pt reports no other family member is available to pick up patient

## 2013-03-31 NOTE — Discharge Summary (Signed)
Physician Discharge Summary  Tony Boyd GYI:[id number] DOB: 1955/11/28 DOA: 03/23/2013  PCP: Hollace Kinnier, DO  Admit date: 03/23/2013 Discharge date: 03/31/2013  Time spent: 50  minutes  Recommendations for Outpatient Follow-up:  1. PCP in 1 week 2. Home health services including [facility]/OT/RN, aide and social work 3. 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   Seizure disorder   Thrombocytopenia, acquired   Diabetes mellitus type 2, uncontrolled, with complications   Anemia of chronic disease   Severe protein-calorie malnutrition   Altered mental status   hypothyroidism  Discharge Condition: stable  Diet recommendation: diabetic, low sodium  Filed Weights   03/28/13 0653 03/29/13 0634 03/31/13 0519  Weight: 101.1 kg (222 lb 14.2 oz) 101.3 kg (223 lb 5.2 oz) 101.4 kg (223 lb 8.7 oz)    History of present illness:  Chief Complaint: confusion  Pt w/ hx of 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 Ammonia elevated in ED.   Hospital Course:  Hepatic encephalopathy  - treated with lactulose, rifaximin -initially despite improvement in ammonia levels, his mentation took several more days to improve -he was briefly on ativan per [facility] protocol due to delirium and concern for alcohol withdrawals, this was subsequently stopped - continue lactulose and rifaximin  - TSH, B12 normal  - I cut down oxycodone dosing due to the concern that this may be contributing to his poor mentation  -requires more assistance at home especially given his poor complaince with medications - cognitively improved and mentation at discharge normal, MMSE nromal on the day of discharge. At this time patient is cognitively improved and ideally although  he should be in an ALF, he is adamant to be discharged home and at thsi time is competent to make this decision, unlike before. 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  -continue lasix, spironolactone   Diabetes mellitus  - Continue Lantus and SSI   Hypothyroidism  - continue Synthroid   Seizures  - continue Keppra   Transaminitis with elevated bili  - secondary to liver cirrhosis   Thrombocytopenia  -chronic from cirrhosis, avoid anticoagulants or antiplatelet agents   Chronic pain/back -reports following combat injuries -cut down oxycodone to 5mg  [id number]      Discharge Exam: Filed Vitals:   03/31/13 0519  BP: 108/58  Pulse: 56  Temp: 98 F (36.7 C)  Resp: 18    General: AAOx3, no distress Cardiovascular: S1S2/RRR Respiratory: CTAB  Discharge Instructions      Discharge Orders   Future Appointments Provider Department Dept Phone   04/16/2013 2:15 PM Pricilla Larsson, NP [facility] [phone]   Future Orders Complete By Expires   Diet - low sodium heart healthy  As directed    Increase activity slowly  As directed        Medication List    STOP taking these medications       ALPRAZolam 0.5 MG tablet  Commonly known as:  XANAX     risperiDONE 0.25 MG tablet  Commonly known as:  RISPERDAL      TAKE these medications       ferrous sulfate 325 (65 FE) MG  tablet  Take 325 mg by mouth at bedtime.     folic acid 1 MG tablet  Commonly known as:  FOLVITE  Take 1 mg by mouth every morning.     furosemide 40 MG tablet  Commonly known as:  LASIX  Take 40 mg by mouth 2 (two) times daily.     insulin glargine 100 UNIT/ML injection  Commonly known as:  LANTUS  Inject 0.12 mLs (12 Units total) into the skin at bedtime.     lactulose 10 GM/15ML solution  Commonly known as:  CHRONULAC  Take 67.5 mLs (45 g total) by mouth 3 (three) times daily.  Can take upto 5 times a day, titrate for 2-3 soft stools per day     levETIRAcetam 500 MG tablet  Commonly known as:  KEPPRA  Take 1 tablet (500 mg total) by mouth 2 (two) times daily.     levothyroxine 75 MCG tablet  Commonly known as:  SYNTHROID, LEVOTHROID  Take 75 mcg by mouth every morning.     multivitamin with minerals Tabs tablet  Take 1 tablet by mouth every morning.     nadolol 40 MG tablet  Commonly known as:  CORGARD  Take 1 tablet (40 mg total) by mouth every morning.     omeprazole 20 MG capsule  Commonly known as:  PRILOSEC  Take 20 mg by mouth every morning.     oxyCODONE 5 MG immediate release tablet  Commonly known as:  Oxy IR/ROXICODONE  Take 1 tablet (5 mg total) by mouth every 6 (six) hours as needed for severe pain.     potassium chloride 10 MEQ tablet  Commonly known as:  K-DUR  Take 10 mEq by mouth every morning.     rifaximin 550 MG Tabs tablet  Commonly known as:  XIFAXAN  Take 550 mg by mouth 2 (two) times daily.     spironolactone 100 MG tablet  Commonly known as:  ALDACTONE  Take 200 mg by mouth every morning.       Allergies  Allergen Reactions  . Ativan [Lorazepam] Other (See Comments)    "hallucinations"  . Droperidol Other (See Comments)    unknown  . Penicillins Swelling  . Toradol [Ketorolac Tromethamine] Hives   Follow-up Information   Follow up with REED, TIFFANY, DO. Schedule an appointment as soon as possible for a visit in 1 week.   Specialty:  Geriatric Medicine   Contact information:   [address] [place] [address] [phone]        The results of significant diagnostics from this hospitalization (including imaging, microbiology, ancillary and laboratory) are listed below for reference.    Significant Diagnostic Studies: Ct Head Wo Contrast  03/23/2013   CLINICAL DATA:  Altered mental status, lethargy, lightheadedness.  EXAM: CT HEAD WITHOUT CONTRAST  TECHNIQUE: Contiguous axial images were obtained from  the base of the skull through the vertex without intravenous contrast.  COMPARISON:  CT head January 11, 2013  FINDINGS: The ventricles and sulci are normal for age. No intraparenchymal hemorrhage, mass effect nor midline shift. Minimal patchy supratentorial white matter hypodensities are within normal range for patient's age and though non-specific suggest sequelae of chronic small vessel ischemic disease. No acute large vascular territory infarcts.  No abnormal extra-axial fluid collections. Basal cisterns are patent. Moderate calcific atherosclerosis of the carotid siphons.  No skull fracture. Small left maxillary mucosal retention cysts without paranasal sinus air fluid levels. Trace soft tissue density within the left  mastoid tip, improved. Soft tissue within the external auditory canals bilaterally may reflect cerumen. The included ocular globes and orbital contents are non-suspicious.  IMPRESSION: No acute intracranial process.   Electronically Signed   By: Elon Alas   On: 03/23/2013 04:01   Dg Chest Portable 1 View  03/23/2013   CLINICAL DATA:  Altered mental status, shortness of breath.  EXAM: PORTABLE CHEST - 1 VIEW  COMPARISON:  Chest radiograph February 18, 2013  FINDINGS: Cardiac silhouette appears mildly enlarged unchanged, mediastinal silhouette is unremarkable. Similar central pulmonary vasculature congestion with increasing mild interstitial prominence. No pleural effusions or focal consolidations. No pneumothorax.  Multiple EKG lines overlie the patient and may obscure subtle underlying pathology. Soft tissue planes and included osseous structures are nonsuspicious.  IMPRESSION: Mild cardiomegaly with increasing mild interstitial prominence which may reflect interstitial edema.   Electronically Signed   By: Elon Alas   On: 03/23/2013 02:01    Microbiology: Recent Results (from the past 240 hour(s))  MRSA PCR SCREENING     Status: None   Collection Time    03/23/13  1:39  PM      Result Value Range Status   MRSA by PCR NEGATIVE  NEGATIVE Final   Comment:            The GeneXpert MRSA Assay (FDA     approved for NASAL specimens     only), is one component of a     comprehensive MRSA colonization     surveillance program. It is not     intended to diagnose MRSA     infection nor to guide or     monitor treatment for     MRSA infections.     Labs: Basic Metabolic Panel:  Recent Labs Lab 03/25/13 0542 03/26/13 0740 03/27/13 0451  NA 137 138 138  K 4.2 3.7 4.0  CL 101 100 102  CO2 27 29 25   GLUCOSE 137* 109* 102*  BUN 15 15 18   CREATININE 0.76 0.69 0.75  CALCIUM 9.0 8.7 8.6   Liver Function Tests:  Recent Labs Lab 03/25/13 0542 03/27/13 0451  AST 77* 64*  ALT 25 25  ALKPHOS 163* 152*  BILITOT 3.2* 3.9*  PROT 7.7 7.4  ALBUMIN 3.0* 2.9*   No results found for this basename: LIPASE, AMYLASE,  in the last 168 hours  Recent Labs Lab 03/25/13 0542  AMMONIA 58   CBC:  Recent Labs Lab 03/25/13 0542 03/26/13 0740  WBC 3.2* 3.4*  HGB 11.7* 11.1*  HCT 36.3* 34.4*  MCV 106.8* 106.5*  PLT 38* 28*   Cardiac Enzymes: No results found for this basename: CKTOTAL, CKMB, CKMBINDEX, TROPONINI,  in the last 168 hours BNP: BNP (last 3 results)  Recent Labs  12/03/12 1435 01/11/13 2055 02/18/13 0234  PROBNP 297.7* 207.4* 985.8*   CBG:  Recent Labs Lab 03/30/13 1123 03/30/13 1621 03/30/13 2110 03/31/13 0809 03/31/13 1119  GLUCAP 168* 146* 153* 106* 155*       Signed:  Eddy Termine  [facility] 03/31/2013, 12:59 PM

## 2013-03-31 NOTE — Progress Notes (Signed)
Pt is up for discharge.  Attempted to call both of patients daughters.   No answer from either daughters.  Left a message awaiting call back.

## 2013-03-31 NOTE — Progress Notes (Addendum)
Called patient daughter, [name].  Pt daughter reports that she is in route to pick up patient.

## 2013-03-31 NOTE — Progress Notes (Signed)
Pt aaox3.  Pt has no complaints at this time.  Pt calm and cooperative.  No change in patient assessment.

## 2013-04-01 ENCOUNTER — Encounter (HOSPITAL_COMMUNITY): Payer: Self-pay

## 2013-04-01 LAB — GLUCOSE, CAPILLARY
Glucose-Capillary: 80 mg/dL (ref 70–99)
Glucose-Capillary: 148 mg/dL — ABNORMAL HIGH (ref 70–99)
Glucose-Capillary: 145 mg/dL — ABNORMAL HIGH (ref 70–99)
Glucose-Capillary: 153 mg/dL — ABNORMAL HIGH (ref 70–99)

## 2013-04-01 NOTE — Progress Notes (Addendum)
CSW met with patient at request of patient RN. Patient is alert and much more oriented today than last time CSW spoke with patient. CSW discussed concerns as patient has been evicted from his [place] apartment. CSW spoke with patient about going to snf and stated that CSW has never cancelled level 2 and CSW thinks that is still best option for patient. He is hesitant. CSW and RN Kiristin spoke at length with patient about benefits of rehab and our concerns regarding him not having a place to go where someone can care for him. Patient appears more open. CSW stated CSW will let him think about it and CSW will come back and talk with him later in the afternoon.  Mikhail Hallenbeck C. Blanket MSW, [place] CSW went back and spoke with patient. He is agreeable to going to [facility] as he doesn't have many other options. He is concerned about how it will go but is relieved it is a place he has been before.  Merelyn Klump C. Marquette MSW, [place]

## 2013-04-01 NOTE — Progress Notes (Signed)
Patient ID: Tony Boyd, male   DOB: [birth year], 58 y.o.   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 Ammonia elevated in ED.   Please see discharge summary by Dr. [facility] John 03/31/2013. No changes to discharge summary needed. Patient clear for discharge.  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   Objective: Filed Vitals:   03/31/13 0519 03/31/13 1401 03/31/13 2215 04/01/13 0650  BP: 108/58 102/57 134/88 118/65  Pulse: 56 61 77 54  Temp: 98 F (36.7 C) 98 F (36.7 C) 97.6 F (36.4 C) 97.6 F (36.4 C)  TempSrc: Oral Oral Oral Oral  Resp: 18 18 18 18   Height:      Weight: 101.4 kg (223 lb 8.7 oz)     SpO2: 96% 97% 100% 98%    Intake/Output Summary (Last 24 hours) at 04/01/13 1005 Last data filed at 04/01/13 0900  Gross per 24 hour  Intake   1080 ml  Output    350 ml  Net    730 ml    Exam:   General:  Pt is alert, follows commands appropriately, not in acute distress  Cardiovascular: Regular rate and rhythm, S1/S2, no murmurs, no rubs, no gallops  Respiratory: Clear to auscultation bilaterally, no wheezing, no crackles, no  rhonchi  Abdomen: Soft, non tender, non distended, bowel sounds present, no guarding  Extremities: No edema, pulses DP and PT palpable bilaterally  Neuro: Grossly nonfocal  Data Reviewed: Basic Metabolic Panel:  Recent Labs Lab 03/26/13 0740 03/27/13 0451  NA 138 138  K 3.7 4.0  CL 100 102  CO2 29 25  GLUCOSE 109* 102*  BUN 15 18  CREATININE 0.69 0.75  CALCIUM 8.7 8.6   Liver Function Tests:  Recent Labs Lab 03/27/13 0451  AST 64*  ALT 25  ALKPHOS 152*  BILITOT 3.9*  PROT 7.4  ALBUMIN 2.9*  CBC:  Recent Labs Lab 03/26/13 0740  WBC 3.4*  HGB 11.1*  HCT 34.4*  MCV 106.5*  PLT 28*  CBG:  Recent Labs Lab 03/31/13 0809 03/31/13 1119 03/31/13 1637 03/31/13 2157 04/01/13 0735  GLUCAP 106* 155* 135* 120* 80    Recent Results (from the past 240 hour(s))  MRSA PCR SCREENING     Status: None   Collection Time    03/23/13  1:39 PM      Result Value Range Status   MRSA by PCR NEGATIVE  NEGATIVE Final   Comment:            The GeneXpert MRSA Assay (FDA     approved for NASAL specimens     only), is one component of a     comprehensive MRSA colonization     surveillance program. It  is not     intended to diagnose MRSA     infection nor to guide or     monitor treatment for     MRSA infections.     Scheduled Meds: . ferrous sulfate  325 mg Oral QHS  . folic acid  1 mg Oral Daily  . furosemide  40 mg Oral BID  . insulin aspart  0-5 Units Subcutaneous QHS  . insulin aspart  0-9 Units Subcutaneous TID WC  . insulin aspart  3 Units Subcutaneous TID WC  . insulin glargine  12 Units Subcutaneous QHS  . lactulose  45 g Oral TID  . levETIRAcetam  500 mg Oral BID  . levothyroxine  75 mcg Oral q morning - 10a  . multivitamin with minerals  1 tablet Oral Daily  . nadolol  40 mg Oral q morning - 10a  . pantoprazole  40 mg Oral Daily  . potassium chloride  10 mEq Oral q morning - 10a  . rifaximin  550 mg Oral TID  . spironolactone  200 mg Oral q morning -  10a  . thiamine  100 mg Oral Daily   Continuous Infusions:  Faye Ramsay, MD  [facility] Pager [phone]  If 7PM-7AM, please contact night-coverage [email or web address] Password [email or web address] 04/01/2013, 10:05 AM   LOS: 9 days

## 2013-04-01 NOTE — Progress Notes (Signed)
CSW called patient's daughter, left voicemail requesting call back.  Lannie Heaps C. Landrum MSW, [place]

## 2013-04-02 LAB — GLUCOSE, CAPILLARY
Glucose-Capillary: 107 mg/dL — ABNORMAL HIGH (ref 70–99)
Glucose-Capillary: 170 mg/dL — ABNORMAL HIGH (ref 70–99)

## 2013-04-02 NOTE — Progress Notes (Signed)
Patient ID: Tony Boyd, male   DOB: 1956-01-28, 58 y.o.   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 Ammonia elevated in ED.  Please see discharge summary by Dr. [facility] Boyd 03/31/2013. No changes to discharge summary needed. Patient clear for discharge.   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   Objective: Filed Vitals:   04/01/13 2130 04/02/13 0500 04/02/13 0605 04/02/13 0807  BP: 110/61  117/53 109/61  Pulse: 59  57 57  Temp: 98.6 F (37 C)  98.8 F (37.1 C) 98.5 F (36.9 C)  TempSrc: Oral  Oral Oral  Resp: 18  18 16   Height:      Weight:  99.156 kg (218 lb 9.6 oz)    SpO2: 96%  97% 93%    Intake/Output Summary (Last 24 hours) at 04/02/13 1003 Last data filed at 04/02/13 0833  Gross per 24 hour  Intake   1070 ml  Output    100 ml  Net    970 ml    Exam:   General:  Pt is alert, follows commands appropriately, not in acute distress  Cardiovascular: Regular rate and rhythm, S1/S2, no murmurs, no rubs, no gallops  Respiratory: Clear to auscultation bilaterally, no wheezing, no crackles, no rhonchi  Abdomen: Soft, non tender,  non distended, bowel sounds present, no guarding  Extremities: No edema, pulses DP and PT palpable bilaterally  Neuro: Grossly nonfocal  Data Reviewed: Basic Metabolic Panel:  Recent Labs Lab 03/27/13 0451  NA 138  K 4.0  CL 102  CO2 25  GLUCOSE 102*  BUN 18  CREATININE 0.75  CALCIUM 8.6   Liver Function Tests:  Recent Labs Lab 03/27/13 0451  AST 64*  ALT 25  ALKPHOS 152*  BILITOT 3.9*  PROT 7.4  ALBUMIN 2.9*   No results found for this basename: LIPASE, AMYLASE,  in the last 168 hours No results found for this basename: AMMONIA,  in the last 168 hours CBC: No results found for this basename: WBC, NEUTROABS, HGB, HCT, MCV, PLT,  in the last 168 hours Cardiac Enzymes: No results found for this basename: CKTOTAL, CKMB, CKMBINDEX, TROPONINI,  in the last 168 hours BNP: No components found with this basename: POCBNP,  CBG:  Recent Labs Lab 04/01/13 0735 04/01/13 1128 04/01/13 1628 04/01/13 2109 04/02/13 0734  GLUCAP 80 148* 145* 153* 107*    Recent Results (from the past 240 hour(s))  MRSA PCR SCREENING     Status: None   Collection Time    03/23/13  1:39 PM      Result Value Range Status   MRSA by PCR NEGATIVE  NEGATIVE Final   Comment:  The GeneXpert MRSA Assay (FDA     approved for NASAL specimens     only), is one component of a     comprehensive MRSA colonization     surveillance program. It is not     intended to diagnose MRSA     infection nor to guide or     monitor treatment for     MRSA infections.     Scheduled Meds: . ferrous sulfate  325 mg Oral QHS  . folic acid  1 mg Oral Daily  . furosemide  40 mg Oral BID  . insulin aspart  0-5 Units Subcutaneous QHS  . insulin aspart  0-9 Units Subcutaneous TID WC  . insulin aspart  3 Units Subcutaneous TID WC  . insulin glargine  12 Units Subcutaneous QHS  . lactulose  45 g Oral TID  . levETIRAcetam  500 mg Oral BID  . levothyroxine  75 mcg Oral q morning - 10a  . multivitamin  with minerals  1 tablet Oral Daily  . nadolol  40 mg Oral q morning - 10a  . pantoprazole  40 mg Oral Daily  . potassium chloride  10 mEq Oral q morning - 10a  . rifaximin  550 mg Oral TID  . spironolactone  200 mg Oral q morning - 10a  . thiamine  100 mg Oral Daily   Continuous Infusions:    Tony Ramsay, MD  [facility] Pager [phone]  If 7PM-7AM, please contact night-coverage [email or web address] Password [email or web address] 04/02/2013, 10:03 AM   LOS: 10 days

## 2013-04-02 NOTE — Progress Notes (Signed)
Report called to [place] at [facility].  Facility denies questions or concerns at this time.  No changes noted since AM assessment.  No IV in place.  Patient sent with a bag of belongings that were located at his bedside.  Patient denies questions or concerns. [facility] here to transport patient to facility.

## 2013-04-02 NOTE — Progress Notes (Signed)
Clinical Social Work Department CLINICAL SOCIAL WORK PLACEMENT NOTE 04/02/2013  Patient:  Tony Boyd, Tony Boyd  Account Number:  [id number] Admit date:  03/23/2013  Clinical Social Worker:  Caren Hazy, LCSW  Date/time:  03/31/2013 12:00 M  Clinical Social Work is seeking post-discharge placement for this patient at the following level of care:   [facility]   (*CSW will update this form in Epic as items are completed)   03/27/2013  Patient/family provided with [place] Department of Clinical Social Work's list of facilities offering this level of care within the geographic area requested by the patient (or if unable, by the patient's family).  03/27/2013  Patient/family informed of their freedom to choose among providers that offer the needed level of care, that participate in Medicare, Medicaid or managed care program needed by the patient, have an available bed and are willing to accept the patient.  03/27/2013  Patient/family informed of [facility]' ownership interest in [facility], as well as of the fact that they are under no obligation to receive care at this facility.  PASARR submitted to EDS on 03/27/2013 PASARR number received from EDS on 04/02/2013  FL2 transmitted to all facilities in geographic area requested by pt/family on  03/27/2013 FL2 transmitted to all facilities within larger geographic area on   Patient informed that his/her managed care company has contracts with or will negotiate with  certain facilities, including the following:     Patient/family informed of bed offers received:  04/02/2013 Patient chooses bed at [facility] [place] Physician recommends and patient chooses bed at    Patient to be transferred to [facility] [place] on  04/02/2013 Patient to be transferred to facility by [facility]  The following physician request were entered in Epic:   Additional Comments:

## 2013-04-02 NOTE — Progress Notes (Signed)
Tony Boyd received. Patient is ready to go to [facility] today.  Kortnee Bas C. Smock MSW, [place]

## 2013-04-03 ENCOUNTER — Encounter: Payer: Self-pay | Admitting: Internal Medicine

## 2013-04-03 ENCOUNTER — Non-Acute Institutional Stay (SKILLED_NURSING_FACILITY): Payer: Medicare Other | Admitting: Internal Medicine

## 2013-04-03 ENCOUNTER — Other Ambulatory Visit: Payer: Self-pay | Admitting: *Deleted

## 2013-04-03 DIAGNOSIS — G40909 Epilepsy, unspecified, not intractable, without status epilepticus: Secondary | ICD-10-CM

## 2013-04-03 DIAGNOSIS — K7682 Hepatic encephalopathy: Secondary | ICD-10-CM

## 2013-04-03 DIAGNOSIS — K7460 Unspecified cirrhosis of liver: Secondary | ICD-10-CM

## 2013-04-03 DIAGNOSIS — E1165 Type 2 diabetes mellitus with hyperglycemia: Secondary | ICD-10-CM

## 2013-04-03 DIAGNOSIS — M549 Dorsalgia, unspecified: Secondary | ICD-10-CM

## 2013-04-03 DIAGNOSIS — IMO0002 Reserved for concepts with insufficient information to code with codable children: Secondary | ICD-10-CM

## 2013-04-03 DIAGNOSIS — K7290 Hepatic failure, unspecified without coma: Secondary | ICD-10-CM

## 2013-04-03 DIAGNOSIS — E039 Hypothyroidism, unspecified: Secondary | ICD-10-CM

## 2013-04-03 DIAGNOSIS — I8500 Esophageal varices without bleeding: Secondary | ICD-10-CM

## 2013-04-03 DIAGNOSIS — G8929 Other chronic pain: Secondary | ICD-10-CM

## 2013-04-03 DIAGNOSIS — D696 Thrombocytopenia, unspecified: Secondary | ICD-10-CM

## 2013-04-03 DIAGNOSIS — D6959 Other secondary thrombocytopenia: Secondary | ICD-10-CM

## 2013-04-03 DIAGNOSIS — E118 Type 2 diabetes mellitus with unspecified complications: Secondary | ICD-10-CM

## 2013-04-03 MED ORDER — OXYCODONE HCL 5 MG PO TABS: 5.0000 mg | ORAL_TABLET | Freq: Four times a day (QID) | ORAL | Status: DC | PRN

## 2013-04-03 NOTE — Assessment & Plan Note (Signed)
Avoid anticog and antiplatelet meds

## 2013-04-03 NOTE — Assessment & Plan Note (Signed)
Recurrent and secondary to ETOH, HepC and med non compliance; continue lactulose and rifaximin Oxycodone dose reduced by hospitalist over concern that narcotics were affecting mentation; he wanted to be d/c to home instead of ALF so don't know how he got to SNF

## 2013-04-03 NOTE — Assessment & Plan Note (Signed)
A1c 3 months ago was 5.5;continue Lantus 12 units nightly and SSI

## 2013-04-03 NOTE — Telephone Encounter (Signed)
[facility] [place] 

## 2013-04-03 NOTE — Progress Notes (Signed)
MRN: [id number] Name: Tony Boyd  Sex: male Age: 58 y.o. DOB: May 14, 1955  [place] #: [facility] Facility/Room: 128A Level Of Care: SNF Provider: Inocencio Homes D Emergency Contacts: Extended Emergency Contact Information Primary Emergency Contact: Charles,[name] Address: [address]          [place], [place] [address] Johnnette Litter of [place] Phone: [phone] Mobile Phone: [phone] Relation: Daughter Secondary Emergency Contact: [place] of [place] Phone: [phone] Mobile Phone: [phone] Relation: Daughter  Code Status: FULL  Allergies: Ativan; Droperidol; Penicillins; and Toradol  Chief Complaint  Patient presents with  . nursing home admission    HPI: Patient is 58 y.o. 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  Past Medical History  Diagnosis Date  . Cirrhosis of liver   . Hepatitis C   . ETOH abuse   . Hypothyroid   . Psychosis   . Bipolar disorder   . Seizures   . Arthritis   . Back pain   . Coagulopathy     Hx of  . Thrombocytopenia     Hx of  . Pancytopenia     Hx of  . H/O hypokalemia   . Hyponatremia     Hx of  . Korsakoff psychosis   . H/O abdominal abscess   . H/O esophageal varices   . Metabolic encephalopathy   . Hepatic encephalopathy   . Urinary urgency   . H/O renal failure   . H/O ascites   . Altered mental status   . Anemia   . Ascites   . Shortness of breath   . Peripheral vascular disease   . GERD (gastroesophageal reflux disease)   . Thrombocytopenia, acquired 06/02/2012  . Unspecified hypothyroidism 06/02/2012  . Pain in joint, pelvic region and thigh   . Edema   . Obesity, unspecified   . Chest pain, unspecified   . Pneumonitis due to inhalation of food or vomitus   . Other convulsions   . Cellulitis and abscess of upper arm and forearm   . Chronic hepatitis C without mention of hepatic coma   . Hypopotassemia   . Anemia, unspecified   .  Other and unspecified coagulation defects   . Thrombocytopenia, unspecified   . Bipolar I disorder, most recent episode (or current) unspecified   . Unspecified psychosis   . Encephalopathy   . Esophageal varices without mention of bleeding   . Esophagitis, unspecified   . Abscess of liver(572.0)   . Chronic kidney disease, unspecified   . Lumbago   . Personal history of alcoholism   . Personal history of tobacco use, presenting hazards to health   . Cirrhosis of liver without mention of alcohol   . Pneumonitis due to inhalation of food or vomitus 09/19/2010  . Chronic hepatitis C without mention of hepatic coma   . Bipolar I disorder, most recent episode (or current) unspecified   . Unspecified psychosis   . Other ascites   . Personal history of tobacco use, presenting hazards to health   . Cirrhosis of liver without mention of alcohol   . Type 2 diabetes mellitus with diabetic neuropathy     Past Surgical History  Procedure Laterality Date  . Colostomy    . Cholecystectomy    . Small intestine surgery    . Arm surgery      left arm  . US guided drain placement in ventral hernia abscess  07/21/2010  .  Multiple picc line placements    . Esophagogastroduodenoscopy  06/28/2011    Procedure: ESOPHAGOGASTRODUODENOSCOPY (EGD);  Surgeon: Inda Castle, MD;  Location: [facility];  Service: Endoscopy;  Laterality: N/A;  . Esophagogastroduodenoscopy N/A 05/06/2012    Procedure: ESOPHAGOGASTRODUODENOSCOPY (EGD);  Surgeon: Inda Castle, MD;  Location: [facility];  Service: Endoscopy;  Laterality: N/A;  . Esophageal banding N/A 05/06/2012    Procedure: ESOPHAGEAL BANDING;  Surgeon: Inda Castle, MD;  Location: [facility];  Service: Endoscopy;  Laterality: N/A;      Medication List       This list is accurate as of: 04/03/13  9:10 PM.  Always use your most recent med list.               ferrous sulfate 325 (65 FE) MG tablet  Take 325 mg by mouth at bedtime.     folic  acid 1 MG tablet  Commonly known as:  FOLVITE  Take 1 mg by mouth every morning.     furosemide 40 MG tablet  Commonly known as:  LASIX  Take 40 mg by mouth 2 (two) times daily.     insulin glargine 100 UNIT/ML injection  Commonly known as:  LANTUS  Inject 0.12 mLs (12 Units total) into the skin at bedtime.     lactulose 10 GM/15ML solution  Commonly known as:  CHRONULAC  Take 67.5 mLs (45 g total) by mouth 3 (three) times daily. Can take upto 5 times a day, titrate for 2-3 soft stools per day     levETIRAcetam 500 MG tablet  Commonly known as:  KEPPRA  Take 1 tablet (500 mg total) by mouth 2 (two) times daily.     levothyroxine 75 MCG tablet  Commonly known as:  SYNTHROID, LEVOTHROID  Take 75 mcg by mouth every morning.     multivitamin with minerals Tabs tablet  Take 1 tablet by mouth every morning.     nadolol 40 MG tablet  Commonly known as:  CORGARD  Take 1 tablet (40 mg total) by mouth every morning.     omeprazole 20 MG capsule  Commonly known as:  PRILOSEC  Take 20 mg by mouth every morning.     oxyCODONE 5 MG immediate release tablet  Commonly known as:  Oxy IR/ROXICODONE  Take 1 tablet (5 mg total) by mouth every 6 (six) hours as needed for severe pain.     potassium chloride 10 MEQ tablet  Commonly known as:  K-DUR  Take 10 mEq by mouth every morning.     rifaximin 550 MG Tabs tablet  Commonly known as:  XIFAXAN  Take 550 mg by mouth 2 (two) times daily.     spironolactone 100 MG tablet  Commonly known as:  ALDACTONE  Take 200 mg by mouth every morning.        No orders of the defined types were placed in this encounter.    Immunization History  Administered Date(s) Administered  . Influenza Split 11/27/2010  . Influenza,inj,Quad PF,36+ Mos 11/13/2012  . Pneumococcal Polysaccharide-23 08/09/2012    History  Substance Use Topics  . Smoking status: Former Smoker    Types: Cigarettes    Quit date: 02/15/2008  . Smokeless tobacco: Never Used   . Alcohol Use: No     Comment: Quit drinking 1 year ago.      Family history is noncontributory    Review of Systems  DATA OBTAINED: from patient GENERAL: Feels well no fevers, fatigue, appetite  changes SKIN: No itching, rash or wounds EYES: No eye pain, redness, discharge EARS: No earache, tinnitus, change in hearing NOSE: No congestion, drainage or bleeding  MOUTH/THROAT: No mouth or tooth pain, No sore throat, No difficulty chewing or swallowing  RESPIRATORY: No cough, wheezing, SOB CARDIAC: No chest pain, palpitations, lower extremity edema  GI: No abdominal pain, No N/V/D or constipation, No heartburn or reflux  GU: No dysuria, frequency or urgency, or incontinence  MUSCULOSKELETAL: No unrelieved bone/joint pain NEUROLOGIC: No headache, dizziness or focal weakness PSYCHIATRIC: No overt anxiety or sadness. Sleeps well. No behavior issue.   Filed Vitals:   04/03/13 1602  BP: 145/74  Pulse: 90  Temp: 98.1 F (36.7 C)  Resp: 15    Physical Exam  GENERAL APPEARANCE: Alert, conversant. Appropriately groomed. No acute distress.  SKIN: No diaphoresis rash, or wounds HEAD: Normocephalic, atraumatic  EYES: Conjunctiva/lids clear. Pupils round, reactive. EOMs intact.  EARS: External exam WNL, canals clear. Hearing grossly normal.  NOSE: No deformity or discharge.  MOUTH/THROAT: Lips w/o lesions. RESPIRATORY: Breathing is even, unlabored. Lung sounds are clear   CARDIOVASCULAR: Heart RRR no murmurs, rubs or gallops. Trace pitting peripheral edema.  VENOUS:  + venous stasis skin changes  GASTROINTESTINAL: Abdomen is soft, non-tender, not distended w/ normal bowel sounds;no ascites GENITOURINARY: Bladder non tender, not distended  MUSCULOSKELETAL: No abnormal joints or musculature NEUROLOGIC: Oriented X3. Cranial nerves 2-12 grossly intact. Moves all extremities no tremor. PSYCHIATRIC: Mood and affect appropriate to situation, no behavioral issues  Patient Active Problem  List   Diagnosis Date Noted  . Chronic back pain 04/03/2013  . Ascites 02/18/2013  . Altered mental status 01/11/2013  . Fall 01/11/2013  . Weakness generalized 10/09/2012  . Anemia of chronic disease 10/07/2012  . Severe protein-calorie malnutrition 10/07/2012  . Anasarca 10/06/2012  . Scrotal swelling 10/06/2012  . Diabetes mellitus type 2, uncontrolled, with complications 46/96/2952  . Seizure disorder 06/02/2012  . Thrombocytopenia, acquired 06/02/2012  . Hypothyroidism 06/02/2012  . Esophageal varices without mention of bleeding 06/28/2011  . Cirrhosis 05/30/2011  . Hepatic encephalopathy 04/18/2011  . ETOH abuse 04/18/2011  . Hepatitis C 04/18/2011    CBC    Component Value Date/Time   WBC 3.4* 03/26/2013 0740   WBC 3.6 01/08/2013 1150   RBC 3.23* 03/26/2013 0740   RBC 3.04* 01/08/2013 1150   HGB 11.1* 03/26/2013 0740   HCT 34.4* 03/26/2013 0740   PLT 28* 03/26/2013 0740   MCV 106.5* 03/26/2013 0740   LYMPHSABS 1.1 02/18/2013 0234   LYMPHSABS 0.5* 01/08/2013 1150   MONOABS 0.4 02/18/2013 0234   EOSABS 0.2 02/18/2013 0234   EOSABS 0.2 01/08/2013 1150   BASOSABS 0.0 02/18/2013 0234   BASOSABS 0.0 01/08/2013 1150    CMP     Component Value Date/Time   NA 138 03/27/2013 0451   NA 132* 02/11/2013 1100   K 4.0 03/27/2013 0451   CL 102 03/27/2013 0451   CO2 25 03/27/2013 0451   GLUCOSE 102* 03/27/2013 0451   GLUCOSE 129* 02/11/2013 1100   BUN 18 03/27/2013 0451   BUN 8 02/11/2013 1100   CREATININE 0.75 03/27/2013 0451   CALCIUM 8.6 03/27/2013 0451   PROT 7.4 03/27/2013 0451   PROT 5.8* 02/11/2013 1100   ALBUMIN 2.9* 03/27/2013 0451   AST 64* 03/27/2013 0451   ALT 25 03/27/2013 0451   ALKPHOS 152* 03/27/2013 0451   BILITOT 3.9* 03/27/2013 0451   GFRNONAA >90 03/27/2013 0451   GFRAA >90 03/27/2013 0451  Assessment and Plan  Hepatic encephalopathy Recurrent and secondary to ETOH, HepC and med non compliance; continue lactulose and rifaximin Oxycodone dose reduced by  hospitalist over concern that narcotics were affecting mentation; he wanted to be d/c to home instead of ALF so don't know how he got to SNF  Cirrhosis Due to hep C and ETOH;continue lasix and spironolactone  Diabetes mellitus type 2, uncontrolled, with complications Y0V 3 months ago was 5.5;continue Lantus 12 units nightly and SSI  Hypothyroidism TSH 1.55 recently;continue synthroid 75 mcg  Seizure disorder Continue keppra  Thrombocytopenia, acquired Avoid anticog and antiplatelet meds  Esophageal varices without mention of bleeding Not a problem this admit  Chronic back pain Pain meds were decreased this admit over mentation concerns    Hennie Duos, MD

## 2013-04-03 NOTE — Assessment & Plan Note (Signed)
TSH 1.55 recently;continue synthroid 75 mcg

## 2013-04-03 NOTE — Assessment & Plan Note (Signed)
Not a problem this admit

## 2013-04-03 NOTE — Assessment & Plan Note (Signed)
Due to hep C and ETOH;continue lasix and spironolactone

## 2013-04-03 NOTE — Assessment & Plan Note (Signed)
Continue keppra

## 2013-04-03 NOTE — Assessment & Plan Note (Signed)
Pain meds were decreased this admit over mentation concerns

## 2013-04-04 ENCOUNTER — Other Ambulatory Visit (HOSPITAL_COMMUNITY): Payer: Self-pay | Admitting: Internal Medicine

## 2013-04-06 ENCOUNTER — Encounter (HOSPITAL_COMMUNITY): Payer: Self-pay | Admitting: Emergency Medicine

## 2013-04-06 ENCOUNTER — Observation Stay (HOSPITAL_COMMUNITY): Payer: Medicare Other

## 2013-04-06 ENCOUNTER — Inpatient Hospital Stay (HOSPITAL_COMMUNITY)
Admission: EM | Admit: 2013-04-06 | Discharge: 2013-04-11 | DRG: 091 | Disposition: A | Discharge status: Home / Self Care | Payer: Medicare Other | Attending: Internal Medicine | Admitting: Internal Medicine

## 2013-04-06 DIAGNOSIS — E43 Unspecified severe protein-calorie malnutrition: Secondary | ICD-10-CM | POA: Diagnosis present

## 2013-04-06 DIAGNOSIS — D696 Thrombocytopenia, unspecified: Secondary | ICD-10-CM | POA: Diagnosis present

## 2013-04-06 DIAGNOSIS — F319 Bipolar disorder, unspecified: Secondary | ICD-10-CM | POA: Diagnosis present

## 2013-04-06 DIAGNOSIS — R4182 Altered mental status, unspecified: Secondary | ICD-10-CM

## 2013-04-06 DIAGNOSIS — G8929 Other chronic pain: Secondary | ICD-10-CM | POA: Diagnosis present

## 2013-04-06 DIAGNOSIS — E236 Other disorders of pituitary gland: Secondary | ICD-10-CM | POA: Diagnosis present

## 2013-04-06 DIAGNOSIS — Z6833 Body mass index (BMI) 33.0-33.9, adult: Secondary | ICD-10-CM

## 2013-04-06 DIAGNOSIS — E039 Hypothyroidism, unspecified: Secondary | ICD-10-CM | POA: Diagnosis present

## 2013-04-06 DIAGNOSIS — I8500 Esophageal varices without bleeding: Secondary | ICD-10-CM | POA: Diagnosis present

## 2013-04-06 DIAGNOSIS — G928 Other toxic encephalopathy: Secondary | ICD-10-CM | POA: Diagnosis present

## 2013-04-06 DIAGNOSIS — K7460 Unspecified cirrhosis of liver: Secondary | ICD-10-CM | POA: Diagnosis present

## 2013-04-06 DIAGNOSIS — E1165 Type 2 diabetes mellitus with hyperglycemia: Secondary | ICD-10-CM | POA: Diagnosis present

## 2013-04-06 DIAGNOSIS — E118 Type 2 diabetes mellitus with unspecified complications: Secondary | ICD-10-CM

## 2013-04-06 DIAGNOSIS — E871 Hypo-osmolality and hyponatremia: Secondary | ICD-10-CM | POA: Diagnosis present

## 2013-04-06 DIAGNOSIS — R569 Unspecified convulsions: Secondary | ICD-10-CM

## 2013-04-06 DIAGNOSIS — D638 Anemia in other chronic diseases classified elsewhere: Secondary | ICD-10-CM | POA: Diagnosis present

## 2013-04-06 DIAGNOSIS — Z9089 Acquired absence of other organs: Secondary | ICD-10-CM

## 2013-04-06 DIAGNOSIS — Z794 Long term (current) use of insulin: Secondary | ICD-10-CM

## 2013-04-06 DIAGNOSIS — Z9119 Patient's noncompliance with other medical treatment and regimen: Secondary | ICD-10-CM

## 2013-04-06 DIAGNOSIS — Z8249 Family history of ischemic heart disease and other diseases of the circulatory system: Secondary | ICD-10-CM

## 2013-04-06 DIAGNOSIS — G40909 Epilepsy, unspecified, not intractable, without status epilepticus: Secondary | ICD-10-CM

## 2013-04-06 DIAGNOSIS — Z91199 Patient's noncompliance with other medical treatment and regimen due to unspecified reason: Secondary | ICD-10-CM

## 2013-04-06 DIAGNOSIS — M545 Low back pain, unspecified: Secondary | ICD-10-CM | POA: Diagnosis present

## 2013-04-06 DIAGNOSIS — D689 Coagulation defect, unspecified: Secondary | ICD-10-CM | POA: Diagnosis present

## 2013-04-06 DIAGNOSIS — E1149 Type 2 diabetes mellitus with other diabetic neurological complication: Secondary | ICD-10-CM | POA: Diagnosis present

## 2013-04-06 DIAGNOSIS — F1010 Alcohol abuse, uncomplicated: Secondary | ICD-10-CM

## 2013-04-06 DIAGNOSIS — R197 Diarrhea, unspecified: Secondary | ICD-10-CM | POA: Diagnosis present

## 2013-04-06 DIAGNOSIS — G929 Unspecified toxic encephalopathy: Principal | ICD-10-CM | POA: Diagnosis present

## 2013-04-06 DIAGNOSIS — K7682 Hepatic encephalopathy: Secondary | ICD-10-CM

## 2013-04-06 DIAGNOSIS — I8510 Secondary esophageal varices without bleeding: Secondary | ICD-10-CM | POA: Diagnosis present

## 2013-04-06 DIAGNOSIS — R1310 Dysphagia, unspecified: Secondary | ICD-10-CM | POA: Diagnosis present

## 2013-04-06 DIAGNOSIS — Z87891 Personal history of nicotine dependence: Secondary | ICD-10-CM

## 2013-04-06 DIAGNOSIS — M549 Dorsalgia, unspecified: Secondary | ICD-10-CM

## 2013-04-06 DIAGNOSIS — K7030 Alcoholic cirrhosis of liver without ascites: Secondary | ICD-10-CM | POA: Diagnosis present

## 2013-04-06 DIAGNOSIS — K7290 Hepatic failure, unspecified without coma: Secondary | ICD-10-CM | POA: Diagnosis present

## 2013-04-06 DIAGNOSIS — K769 Liver disease, unspecified: Secondary | ICD-10-CM | POA: Diagnosis present

## 2013-04-06 DIAGNOSIS — E440 Moderate protein-calorie malnutrition: Secondary | ICD-10-CM | POA: Insufficient documentation

## 2013-04-06 DIAGNOSIS — F4310 Post-traumatic stress disorder, unspecified: Secondary | ICD-10-CM | POA: Diagnosis present

## 2013-04-06 DIAGNOSIS — G92 Toxic encephalopathy: Principal | ICD-10-CM | POA: Diagnosis present

## 2013-04-06 DIAGNOSIS — E1142 Type 2 diabetes mellitus with diabetic polyneuropathy: Secondary | ICD-10-CM | POA: Diagnosis present

## 2013-04-06 DIAGNOSIS — IMO0002 Reserved for concepts with insufficient information to code with codable children: Secondary | ICD-10-CM | POA: Diagnosis present

## 2013-04-06 LAB — URINALYSIS, ROUTINE W REFLEX MICROSCOPIC
Bilirubin Urine: NEGATIVE
Glucose, UA: NEGATIVE mg/dL
Hgb urine dipstick: NEGATIVE
KETONES UR: NEGATIVE mg/dL
Leukocytes, UA: NEGATIVE
NITRITE: NEGATIVE
PH: 6 (ref 5.0–8.0)
Protein, ur: NEGATIVE mg/dL
SPECIFIC GRAVITY, URINE: 1.009 (ref 1.005–1.030)
Urobilinogen, UA: 1 mg/dL (ref 0.0–1.0)

## 2013-04-06 LAB — COMPREHENSIVE METABOLIC PANEL
ALBUMIN: 2.8 g/dL — AB (ref 3.5–5.2)
ALT: 45 U/L (ref 0–53)
AST: 70 U/L — ABNORMAL HIGH (ref 0–37)
Alkaline Phosphatase: 173 U/L — ABNORMAL HIGH (ref 39–117)
BUN: 12 mg/dL (ref 6–23)
CALCIUM: 8 mg/dL — AB (ref 8.4–10.5)
CO2: 20 mEq/L (ref 19–32)
Chloride: 86 mEq/L — ABNORMAL LOW (ref 96–112)
Creatinine, Ser: 0.58 mg/dL (ref 0.50–1.35)
GFR calc non Af Amer: >90 mL/min (ref >90)
Glucose, Bld: 152 mg/dL — ABNORMAL HIGH (ref 70–99)
Potassium: 4.9 mEq/L (ref 3.7–5.3)
Sodium: 119 mEq/L — CL (ref 137–147)
TOTAL PROTEIN: 7.3 g/dL (ref 6.0–8.3)
Total Bilirubin: 4.6 mg/dL — ABNORMAL HIGH (ref 0.3–1.2)

## 2013-04-06 LAB — RAPID URINE DRUG SCREEN, HOSP PERFORMED
Amphetamines: NOT DETECTED
BARBITURATES: NOT DETECTED
Benzodiazepines: NOT DETECTED
Cocaine: NOT DETECTED
OPIATES: NOT DETECTED
Tetrahydrocannabinol: NOT DETECTED

## 2013-04-06 LAB — CBC WITH DIFFERENTIAL/PLATELET
Basophils Absolute: 0 10*3/uL (ref 0.0–0.1)
Basophils Absolute: 0 10*3/uL (ref 0.0–0.1)
Basophils Relative: 0 % (ref 0–1)
Basophils Relative: 0 % (ref 0–1)
EOS PCT: 2 % (ref 0–5)
Eosinophils Absolute: 0.1 10*3/uL (ref 0.0–0.7)
Eosinophils Absolute: 0.1 10*3/uL (ref 0.0–0.7)
Eosinophils Relative: 2 % (ref 0–5)
HCT: 31.4 % — ABNORMAL LOW (ref 39.0–52.0)
HCT: 31.6 % — ABNORMAL LOW (ref 39.0–52.0)
HEMOGLOBIN: 11.3 g/dL — AB (ref 13.0–17.0)
Hemoglobin: 11.4 g/dL — ABNORMAL LOW (ref 13.0–17.0)
LYMPHS ABS: 0.8 10*3/uL (ref 0.7–4.0)
LYMPHS PCT: 12 % (ref 12–46)
Lymphocytes Relative: 16 % (ref 12–46)
Lymphs Abs: 0.6 10*3/uL — ABNORMAL LOW (ref 0.7–4.0)
MCH: 35.2 pg — ABNORMAL HIGH (ref 26.0–34.0)
MCH: 34.8 pg — ABNORMAL HIGH (ref 26.0–34.0)
MCHC: 36.3 g/dL — ABNORMAL HIGH (ref 30.0–36.0)
MCHC: 35.8 g/dL (ref 30.0–36.0)
MCV: 96.9 fL (ref 78.0–100.0)
MCV: 97.2 fL (ref 78.0–100.0)
MONOS PCT: 8 % (ref 3–12)
MONOS PCT: 7 % (ref 3–12)
Monocytes Absolute: 0.4 10*3/uL (ref 0.1–1.0)
Monocytes Absolute: 0.4 10*3/uL (ref 0.1–1.0)
NEUTROS ABS: 3.6 10*3/uL (ref 1.7–7.7)
Neutro Abs: 3.8 10*3/uL (ref 1.7–7.7)
Neutrophils Relative %: 78 % — ABNORMAL HIGH (ref 43–77)
Neutrophils Relative %: 75 % (ref 43–77)
PLATELETS: 43 10*3/uL — AB (ref 150–400)
Platelets: 43 10*3/uL — ABNORMAL LOW (ref 150–400)
RBC: 3.24 MIL/uL — AB (ref 4.22–5.81)
RBC: 3.25 MIL/uL — AB (ref 4.22–5.81)
RDW: 14.6 % (ref 11.5–15.5)
RDW: 14.6 % (ref 11.5–15.5)
WBC: 4.9 10*3/uL (ref 4.0–10.5)
WBC: 4.8 10*3/uL (ref 4.0–10.5)

## 2013-04-06 LAB — PROTIME-INR
INR: 2.21 — AB (ref 0.00–1.49)
PROTHROMBIN TIME: 23.8 s — AB (ref 11.6–15.2)

## 2013-04-06 LAB — ETHANOL: Alcohol, Ethyl (B): <11 mg/dL (ref 0–11)

## 2013-04-06 LAB — CG4 I-STAT (LACTIC ACID): Lactic Acid, Venous: 3.61 mmol/L — ABNORMAL HIGH (ref 0.5–2.2)

## 2013-04-06 LAB — TROPONIN I
Troponin I: <0.3 ng/mL (ref <0.30)
Troponin I: <0.3 ng/mL (ref <0.30)

## 2013-04-06 LAB — AMMONIA: Ammonia: 69 umol/L — ABNORMAL HIGH (ref 11–60)

## 2013-04-06 MED ORDER — RIFAXIMIN 550 MG PO TABS
550.0000 mg | ORAL_TABLET | Freq: Two times a day (BID) | ORAL | Status: DC
  Administered 2013-04-06 – 2013-04-11 (×10): 550 mg via ORAL
  Filled 2013-04-06 (×11): qty 1

## 2013-04-06 MED ORDER — FUROSEMIDE 40 MG PO TABS
40.0000 mg | ORAL_TABLET | Freq: Two times a day (BID) | ORAL | Status: DC
  Administered 2013-04-06 – 2013-04-11 (×10): 40 mg via ORAL
  Filled 2013-04-06 (×12): qty 1

## 2013-04-06 MED ORDER — FOLIC ACID 1 MG PO TABS
1.0000 mg | ORAL_TABLET | Freq: Every morning | ORAL | Status: DC
  Administered 2013-04-07 – 2013-04-11 (×5): 1 mg via ORAL
  Filled 2013-04-06 (×5): qty 1

## 2013-04-06 MED ORDER — LACTULOSE 10 GM/15ML PO SOLN
30.0000 g | Freq: Once | ORAL | Status: AC
  Administered 2013-04-06: 30 g via ORAL
  Filled 2013-04-06: qty 45

## 2013-04-06 MED ORDER — OXYCODONE HCL 5 MG PO TABS
5.0000 mg | ORAL_TABLET | Freq: Four times a day (QID) | ORAL | Status: DC | PRN
  Administered 2013-04-06 – 2013-04-11 (×18): 5 mg via ORAL
  Filled 2013-04-06 (×18): qty 1

## 2013-04-06 MED ORDER — SODIUM CHLORIDE 0.9 % IV SOLN
1000.0000 mg | Freq: Once | INTRAVENOUS | Status: AC
  Administered 2013-04-06: 1000 mg via INTRAVENOUS
  Filled 2013-04-06: qty 10

## 2013-04-06 MED ORDER — OXYCODONE HCL 5 MG PO TABS
5.0000 mg | ORAL_TABLET | Freq: Once | ORAL | Status: AC
  Administered 2013-04-06: 5 mg via ORAL
  Filled 2013-04-06: qty 1

## 2013-04-06 MED ORDER — SODIUM CHLORIDE 0.9 % IJ SOLN
3.0000 mL | Freq: Two times a day (BID) | INTRAMUSCULAR | Status: DC
  Administered 2013-04-06 – 2013-04-10 (×5): 3 mL via INTRAVENOUS

## 2013-04-06 MED ORDER — LEVOTHYROXINE SODIUM 75 MCG PO TABS
75.0000 ug | ORAL_TABLET | Freq: Every day | ORAL | Status: DC
  Administered 2013-04-07 – 2013-04-11 (×5): 75 ug via ORAL
  Filled 2013-04-06 (×6): qty 1

## 2013-04-06 MED ORDER — INSULIN GLARGINE 100 UNIT/ML ~~LOC~~ SOLN
12.0000 [IU] | Freq: Every day | SUBCUTANEOUS | Status: DC
  Administered 2013-04-07 – 2013-04-10 (×4): 12 [IU] via SUBCUTANEOUS
  Filled 2013-04-06 (×5): qty 0.12

## 2013-04-06 MED ORDER — PANTOPRAZOLE SODIUM 40 MG PO TBEC
40.0000 mg | DELAYED_RELEASE_TABLET | Freq: Every day | ORAL | Status: DC
  Administered 2013-04-07 – 2013-04-11 (×5): 40 mg via ORAL
  Filled 2013-04-06 (×6): qty 1

## 2013-04-06 MED ORDER — LEVETIRACETAM 500 MG PO TABS
750.0000 mg | ORAL_TABLET | Freq: Two times a day (BID) | ORAL | Status: DC
  Administered 2013-04-06 – 2013-04-09 (×6): 750 mg via ORAL
  Filled 2013-04-06 (×7): qty 1.5

## 2013-04-06 MED ORDER — SPIRONOLACTONE 100 MG PO TABS
200.0000 mg | ORAL_TABLET | Freq: Every morning | ORAL | Status: DC
  Administered 2013-04-07 – 2013-04-11 (×5): 200 mg via ORAL
  Filled 2013-04-06 (×5): qty 2

## 2013-04-06 MED ORDER — NADOLOL 40 MG PO TABS
40.0000 mg | ORAL_TABLET | Freq: Every morning | ORAL | Status: DC
Start: 2013-04-07 — End: 2013-04-11
  Administered 2013-04-07 – 2013-04-11 (×5): 40 mg via ORAL
  Filled 2013-04-06 (×5): qty 1

## 2013-04-06 MED ORDER — FERROUS SULFATE 325 (65 FE) MG PO TABS
325.0000 mg | ORAL_TABLET | Freq: Every day | ORAL | Status: DC
  Administered 2013-04-06 – 2013-04-10 (×5): 325 mg via ORAL
  Filled 2013-04-06 (×6): qty 1

## 2013-04-06 MED ORDER — LACTULOSE 10 GM/15ML PO SOLN
45.0000 g | Freq: Three times a day (TID) | ORAL | Status: DC
  Administered 2013-04-07 – 2013-04-11 (×11): 45 g via ORAL
  Filled 2013-04-06 (×17): qty 90

## 2013-04-06 NOTE — ED Notes (Signed)
Dr Eulis Foster aware of elevated I stat Lactic

## 2013-04-06 NOTE — ED Notes (Signed)
Bed: WA08 Expected date:  Expected time:  Means of arrival:  Comments: ems 

## 2013-04-06 NOTE — ED Notes (Signed)
Pt found at local hotel hallway by management with altered mental status.  Pt is not a guest at the hotel.  Pt was sitting on floor against door.  Stroke screen negative per EMS.  No injuries consist with trauma noted.  Pt was able to stand and get on the stretcher.

## 2013-04-06 NOTE — Progress Notes (Deleted)
[facility] History and Physical  Tony Boyd [id number] DOB: 1955-12-09 DOA: 04/06/2013  Referring physician: Eulis Foster PCP: Hollace Kinnier, DO  Specialists: none  Chief Complaint: Seizure, altered mental status, Lactic acidosis  HPI: Tony Boyd is a 58 y.o. 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  He got a pizza and apparentyl per daughter drank 3-4 beers [unclear what size].  Patient has most previously been at the nursing home but was allegedly discharge because he was doing "too well" did not meet skilled nursing facility needs Somewhere between the time that the patient was seen by his younger daughter [name] and this morning he was found sitting against the door of her is not against and had a small seizure He proceeded to have a 45 second and 30 seconds tonic-clonic seizure in the emergency room He has not taken any of his medications since yesterday potentially because he drank  Emergency room workup sodium 119, alkaline phosphatase 173, AST 70, ALT 45, total bilirubin 4.6, ammonia 69 Hemoglobin 11.4, hematocrit 31.4, platelet 43, Lactic acid 3.6 INR 2.2 Alcohol level less than 11   Review of Systems: The patient states he has only back pain He is a little lethargic He can however tell me that he is in [facility] He is able to relate some events of yesterday and is actually able to come his daughter's phone number No recent chills or fever nausea or vomiting No shortness of breath  Past  Medical History  Diagnosis Date  . Cirrhosis of liver   . Hepatitis C   . ETOH abuse   . Hypothyroid   . Psychosis   . Bipolar disorder   . Seizures   . Arthritis   . Back pain   . Coagulopathy     Hx of  . Thrombocytopenia     Hx of  . Pancytopenia     Hx of  . H/O hypokalemia   . Hyponatremia     Hx of  . Korsakoff psychosis   . H/O abdominal abscess   . H/O esophageal varices   . Metabolic encephalopathy   . Hepatic encephalopathy   . Urinary urgency   . H/O renal failure   . H/O ascites   . Altered mental status   . Anemia   . Ascites   . Shortness of breath   . Peripheral vascular disease   . GERD (gastroesophageal reflux disease)   . Thrombocytopenia, acquired 06/02/2012  . Unspecified hypothyroidism 06/02/2012  . Pain in joint, pelvic region and thigh   . Edema   . Obesity, unspecified   . Chest pain, unspecified   . Pneumonitis due to inhalation of food or vomitus   . Other convulsions   . Cellulitis and abscess of upper arm and forearm   . Chronic hepatitis C without mention of hepatic coma   . Hypopotassemia   . Anemia, unspecified   . Other and unspecified coagulation defects   . Thrombocytopenia, unspecified   . Bipolar I disorder, most recent episode (or current) unspecified   . Unspecified psychosis   . Encephalopathy   . Esophageal varices without mention  of bleeding   . Esophagitis, unspecified   . Abscess of liver(572.0)   . Chronic kidney disease, unspecified   . Lumbago   . Personal history of alcoholism   . Personal history of tobacco use, presenting hazards to health   . Cirrhosis of liver without mention of alcohol   . Pneumonitis due to inhalation of food or vomitus 09/19/2010  . Chronic hepatitis C without mention of hepatic coma   . Bipolar I disorder, most recent episode (or current) unspecified   . Unspecified psychosis   . Other ascites   . Personal history of tobacco use, presenting hazards to health   . Cirrhosis of liver  without mention of alcohol   . Type 2 diabetes mellitus with diabetic neuropathy    Past Surgical History  Procedure Laterality Date  . Colostomy    . Cholecystectomy    . Small intestine surgery    . Arm surgery      left arm  . US guided drain placement in ventral hernia abscess  07/21/2010  . Multiple picc line placements    . Esophagogastroduodenoscopy  06/28/2011    Procedure: ESOPHAGOGASTRODUODENOSCOPY (EGD);  Surgeon: Inda Castle, MD;  Location: [facility];  Service: Endoscopy;  Laterality: N/A;  . Esophagogastroduodenoscopy N/A 05/06/2012    Procedure: ESOPHAGOGASTRODUODENOSCOPY (EGD);  Surgeon: Inda Castle, MD;  Location: [facility];  Service: Endoscopy;  Laterality: N/A;  . Esophageal banding N/A 05/06/2012    Procedure: ESOPHAGEAL BANDING;  Surgeon: Inda Castle, MD;  Location: [facility];  Service: Endoscopy;  Laterality: N/A;   Social History:  History   Social History Narrative   Resident of ALF.  States he is widowed.  Ambulates with a walker.    Allergies  Allergen Reactions  . Ativan [Lorazepam] Other (See Comments)    "hallucinations"  . Droperidol Other (See Comments)    unknown  . Penicillins Swelling  . Toradol [Ketorolac Tromethamine] Hives    Family History  Problem Relation Age of Onset  . Heart disease Mother     MI  . Lung cancer Brother     Prior to Admission medications   Medication Sig Start Date End Date Taking? Authorizing Provider  ferrous sulfate 325 (65 FE) MG tablet Take 325 mg by mouth at bedtime.    Historical Provider, MD  folic acid (FOLVITE) 1 MG tablet Take 1 mg by mouth every morning.    Historical Provider, MD  furosemide (LASIX) 40 MG tablet Take 40 mg by mouth 2 (two) times daily.    Historical Provider, MD  insulin glargine (LANTUS) 100 UNIT/ML injection Inject 0.12 mLs (12 Units total) into the skin at bedtime. 02/11/13   Pricilla Larsson, NP  lactulose (CHRONULAC) 10 GM/15ML solution Take 67.5 mLs (45 g total)  by mouth 3 (three) times daily. Can take upto 5 times a day, titrate for 2-3 soft stools per day 03/31/13   [name], MD  levETIRAcetam (KEPPRA) 500 MG tablet Take 1 tablet (500 mg total) by mouth 2 (two) times daily. 02/11/13   Pricilla Larsson, NP  levothyroxine (SYNTHROID, LEVOTHROID) 75 MCG tablet Take 75 mcg by mouth every morning.     Historical Provider, MD  Multiple Vitamin (MULTIVITAMIN WITH MINERALS) TABS tablet Take 1 tablet by mouth every morning.    Historical Provider, MD  nadolol (CORGARD) 40 MG tablet Take 1 tablet (40 mg total) by mouth every morning. 02/11/13   Pricilla Larsson, NP  omeprazole (PRILOSEC) 20 MG  capsule Take 20 mg by mouth every morning.     Historical Provider, MD  oxyCODONE (OXY IR/ROXICODONE) 5 MG immediate release tablet Take 1 tablet (5 mg total) by mouth every 6 (six) hours as needed for severe pain. 04/03/13   Pricilla Larsson, NP  potassium chloride (K-DUR) 10 MEQ tablet Take 10 mEq by mouth every morning.    Historical Provider, MD  rifaximin (XIFAXAN) 550 MG TABS tablet Take 550 mg by mouth 2 (two) times daily.    Historical Provider, MD  spironolactone (ALDACTONE) 100 MG tablet Take 200 mg by mouth every morning.    Historical Provider, MD   Physical Exam: Filed Vitals:   04/06/13 1230 04/06/13 1300 04/06/13 1330 04/06/13 1350  BP: 124/57 110/52 111/57   Pulse: 72 60 64   Temp:    98.7 F (37.1 C)  TempSrc:    Rectal  Resp:      SpO2: 97% 98% 97%      General:  Mild icterus, no pallor  Eyes: EOMI  ENT: NCAT no thyromegaly  Neck: Soft supple  Cardiovascular: S1-S2 not tachycardic  Respiratory: Clinically clear  Abdomen: Multiple abdominal scars soft nontender nondistended  Skin: Lower extremity excoriation dryness  Musculoskeletal: Range of motion intact  Psychiatric: Euthymic  Neurologic: Mild flap, reflexes lower extremity brisk  Labs on Admission:  Basic Metabolic Panel:  Recent Labs Lab 04/06/13 1235  NA 119*  K 4.9   CL 86*  CO2 20  GLUCOSE 152*  BUN 12  CREATININE 0.58  CALCIUM 8.0*   Liver Function Tests:  Recent Labs Lab 04/06/13 1235  AST 70*  ALT 45  ALKPHOS 173*  BILITOT 4.6*  PROT 7.3  ALBUMIN 2.8*   No results found for this basename: LIPASE, AMYLASE,  in the last 168 hours  Recent Labs Lab 04/06/13 1235  AMMONIA 69*   CBC:  Recent Labs Lab 04/06/13 1235  WBC SPECIMEN CLOTTED NOTIFIED [facility] IN ED 2.9.15 AT 1330 BY W.SHEA,MT.  NEUTROABS PENDING  HGB SPECIMEN CLOTTED NOTIFIED [facility] IN ED 2.9.15 AT 1330 BY W.SHEA,MT.  HCT SPECIMEN CLOTTED NOTIFIED [facility] ED 2.9.15 AT 1330 BY W.SHEA,MT.  MCV SPECIMEN CLOTTED NOTIFIED [place] ED 2.9.15 AT 1330 BY W.SHEA,MT.  PLT SPECIMEN CLOTTED NOTIFIED [place] ED 2.9.15 AT 1330 BY W.SHEA,MT.   Cardiac Enzymes: No results found for this basename: CKTOTAL, CKMB, CKMBINDEX, TROPONINI,  in the last 168 hours  BNP (last 3 results)  Recent Labs  12/03/12 1435 01/11/13 2055 02/18/13 0234  PROBNP 297.7* 207.4* 985.8*   CBG:  Recent Labs Lab 04/01/13 1128 04/01/13 1628 04/01/13 2109 04/02/13 0734 04/02/13 1107  GLUCAP 148* 145* 153* 107* 170*    Radiological Exams on Admission: No results found.  EKG: Independently reviewed. Sinus rhythm PR interval 0.20, QRS axis -45, ST depressions V1 through 3 unknown significance, compared to prior EKG 02/18/11/26 this is an insignificant change however this needs to be monitored.  Assessment/Plan Principal Problem:   Likely seizure from not taking Keppra-see below. Increased Keppra dose from 500-750 twice a day.   Toxic metabolic encephalopathy-probably related to subacute alcohol intoxication. Could also be secondary to seizure from not being compliant on his Keppra. Low threshold to consult either neurology or get an EEG however this is probably strictly noncompliance with medical therapy. He'll need to be monitored on the telemetry floor as he is currently now coherent and has no  specific issue that needs to be monitored very closely other than neurologic for  the next shift. Active Problems:   Hepatic encephalopathy-ammonia 69, we will give lactulose now.  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  He has an elevated AFP level from December and I feel it is prudent to make sure that ultrasound abdomen does not show any new liver mass given his chronic hepatitis history as well as his alcoholism.    Continue Lasix 40 mg 3 times a day, Aldactone 200 every morning, complete metabolic panel in a.m.   Esophageal varices without mention of bleeding-continue beta blocker 40 mg every morning   Thrombocytopenia, acquired-secondary to above   Diabetes mellitus type 2, uncontrolled, with complications-continue 12 units Lantus, sliding scale insulin ordered, not sure why patient is not on short acting insulin   Severe protein-calorie malnutrition-multifactorial. Nutrition input   Chronic back pain-careful implementation of oxycodone    EKG change-repeat EKG a.m. + cycle troponins as mild changes in precordial leads noted 03/1948   Admitted to inpatient Telemetry Full code Long discussion with daughter at phone number [phone] This gravity of situation.    Time spent:  [place], [facility] Pager [phone]  If 7PM-7AM, please contact night-coverage [email or web address] Password [email or web address] 04/06/2013, 2:04 PM

## 2013-04-06 NOTE — ED Notes (Signed)
Pt having grand mal type seizure lasting 45 sec.

## 2013-04-06 NOTE — ED Notes (Signed)
Pt is having second seizure lasting 30 sec. 15 L NRB placed.  MD notified.  IV attempt x2 unsuccessful.  Marzetta Board, Agricultural consultant at bedside for IV attempt.

## 2013-04-06 NOTE — Telephone Encounter (Signed)
Unable to leave message on home number to ask about medication.

## 2013-04-06 NOTE — ED Provider Notes (Signed)
CSN: [id number]     Arrival date & time 04/06/13  1136 History   First MD Initiated Contact with Patient 04/06/13 1219     Chief Complaint  Patient presents with  . Altered Mental Status     (Consider location/radiation/quality/duration/timing/severity/associated sxs/prior Treatment) Patient is a 58 y.o. male presenting with altered mental status. The history is provided by the patient.  Altered Mental Status  Kemond Gannaway is a 58 y.o. male who presents for evaluation of altered mental status. He was found in a motel sitting against a doorway. He was brought in by EMS. On arrival to the emergency department he had a seizure.  Level 5 caveat- confusion  Past Medical History  Diagnosis Date  . Cirrhosis of liver   . Hepatitis C   . ETOH abuse   . Hypothyroid   . Psychosis   . Bipolar disorder   . Seizures   . Arthritis   . Back pain   . Coagulopathy     Hx of  . Thrombocytopenia     Hx of  . Pancytopenia     Hx of  . H/O hypokalemia   . Hyponatremia     Hx of  . Korsakoff psychosis   . H/O abdominal abscess   . H/O esophageal varices   . Metabolic encephalopathy   . Hepatic encephalopathy   . Urinary urgency   . H/O renal failure   . H/O ascites   . Altered mental status   . Anemia   . Ascites   . Shortness of breath   . Peripheral vascular disease   . GERD (gastroesophageal reflux disease)   . Thrombocytopenia, acquired 06/02/2012  . Unspecified hypothyroidism 06/02/2012  . Pain in joint, pelvic region and thigh   . Edema   . Obesity, unspecified   . Chest pain, unspecified   . Pneumonitis due to inhalation of food or vomitus   . Other convulsions   . Cellulitis and abscess of upper arm and forearm   . Chronic hepatitis C without mention of hepatic coma   . Hypopotassemia   . Anemia, unspecified   . Other and unspecified coagulation defects   . Thrombocytopenia, unspecified   . Bipolar I disorder, most recent episode (or current) unspecified   .  Unspecified psychosis   . Encephalopathy   . Esophageal varices without mention of bleeding   . Esophagitis, unspecified   . Abscess of liver(572.0)   . Chronic kidney disease, unspecified   . Lumbago   . Personal history of alcoholism   . Personal history of tobacco use, presenting hazards to health   . Cirrhosis of liver without mention of alcohol   . Pneumonitis due to inhalation of food or vomitus 09/19/2010  . Chronic hepatitis C without mention of hepatic coma   . Bipolar I disorder, most recent episode (or current) unspecified   . Unspecified psychosis   . Other ascites   . Personal history of tobacco use, presenting hazards to health   . Cirrhosis of liver without mention of alcohol   . Type 2 diabetes mellitus with diabetic neuropathy    Past Surgical History  Procedure Laterality Date  . Colostomy    . Cholecystectomy    . Small intestine surgery    . Arm surgery      left arm  . US guided drain placement in ventral hernia abscess  07/21/2010  . Multiple picc line placements    . Esophagogastroduodenoscopy  06/28/2011  Procedure: ESOPHAGOGASTRODUODENOSCOPY (EGD);  Surgeon: Inda Castle, MD;  Location: [facility];  Service: Endoscopy;  Laterality: N/A;  . Esophagogastroduodenoscopy N/A 05/06/2012    Procedure: ESOPHAGOGASTRODUODENOSCOPY (EGD);  Surgeon: Inda Castle, MD;  Location: [facility];  Service: Endoscopy;  Laterality: N/A;  . Esophageal banding N/A 05/06/2012    Procedure: ESOPHAGEAL BANDING;  Surgeon: Inda Castle, MD;  Location: [facility];  Service: Endoscopy;  Laterality: N/A;   Family History  Problem Relation Age of Onset  . Heart disease Mother     MI  . Lung cancer Brother    History  Substance Use Topics  . Smoking status: Former Smoker    Types: Cigarettes    Quit date: 02/15/2008  . Smokeless tobacco: Never Used  . Alcohol Use: No     Comment: Quit drinking 1 year ago.      Review of Systems  Unable to perform  ROS     Allergies  Ativan; Droperidol; Penicillins; and Toradol  Home Medications   No current outpatient prescriptions on file. BP 112/57  Pulse 58  Temp(Src) 98.7 F (37.1 C) (Rectal)  Resp 17  SpO2 99% Physical Exam  Nursing note and vitals reviewed. Constitutional: He appears well-developed.  Chronically ill appearing  HENT:  Head: Normocephalic and atraumatic.  Right Ear: External ear normal.  Left Ear: External ear normal.  Eyes: EOM are normal. Pupils are equal, round, and reactive to light. Right eye exhibits no discharge. Left eye exhibits no discharge. Scleral icterus is present.  Neck: Normal range of motion and phonation normal. Neck supple. Thyromegaly present.  Cardiovascular: Normal rate, regular rhythm, normal heart sounds and intact distal pulses.   Pulmonary/Chest: Effort normal and breath sounds normal. He exhibits no bony tenderness.  Abdominal: Soft. Normal appearance. He exhibits distension. He exhibits no mass. There is no tenderness.  Musculoskeletal: Normal range of motion. He exhibits no edema and no tenderness.  Neurological: He is alert. No cranial nerve deficit or sensory deficit. He exhibits normal muscle tone. Coordination normal.  He is dysarthric. He attempts to talk, but cannot complete a sentence. He cooperates with examination.  Skin: Skin is warm, dry and intact.  Psychiatric: His behavior is normal.    ED Course  Procedures (including critical care time) Medications  lactulose (CHRONULAC) 10 GM/15ML solution 30 g (not administered)  levETIRAcetam (KEPPRA) 1,000 mg in sodium chloride 0.9 % 100 mL IVPB (0 mg Intravenous Stopped 04/06/13 1439)  oxyCODONE (Oxy IR/ROXICODONE) immediate release tablet 5 mg (5 mg Oral Given 04/06/13 1452)    Patient Vitals for the past 24 hrs:  BP Temp Temp src Pulse Resp SpO2  04/06/13 1453 112/57 mmHg - - 58 17 99 %  04/06/13 1350 - 98.7 F (37.1 C) Rectal - - -  04/06/13 1330 111/57 mmHg - - 64 - 97 %   04/06/13 1300 110/52 mmHg - - 60 - 98 %  04/06/13 1230 124/57 mmHg - - 72 - 97 %  04/06/13 1220 139/63 mmHg - - 76 - 100 %  04/06/13 1144 131/62 mmHg 97.9 F (36.6 C) Oral 63 16 98 %  04/06/13 1142 - - - - - 96 %    1:31 PM Reevaluation with update and discussion. After initial assessment and treatment, an updated evaluation reveals no more seizures. Ronalee Scheunemann L   2:03 PM-Consult complete with Dr. [name]. Patient case explained and discussed. He agrees to admit patient for further evaluation and treatment. Call ended at 14:08   Labs  Review Labs Reviewed  COMPREHENSIVE METABOLIC PANEL - Abnormal; Notable for the following:    Sodium 119 (*)    Chloride 86 (*)    Glucose, Bld 152 (*)    Calcium 8.0 (*)    Albumin 2.8 (*)    AST 70 (*)    Alkaline Phosphatase 173 (*)    Total Bilirubin 4.6 (*)    All other components within normal limits  AMMONIA - Abnormal; Notable for the following:    Ammonia 69 (*)    All other components within normal limits  CBC WITH DIFFERENTIAL - Abnormal; Notable for the following:    RBC 3.24 (*)    Hemoglobin 11.4 (*)    HCT 31.4 (*)    MCH 35.2 (*)    MCHC 36.3 (*)    Platelets 43 (*)    Neutrophils Relative % 78 (*)    Lymphs Abs 0.6 (*)    All other components within normal limits  PROTIME-INR - Abnormal; Notable for the following:    Prothrombin Time 23.8 (*)    INR 2.21 (*)    All other components within normal limits  CG4 I-STAT (LACTIC ACID) - Abnormal; Notable for the following:    Lactic Acid, Venous 3.61 (*)    All other components within normal limits  CBC WITH DIFFERENTIAL  URINALYSIS, ROUTINE W REFLEX MICROSCOPIC  ETHANOL  URINE RAPID DRUG SCREEN (HOSP PERFORMED)  CBC WITH DIFFERENTIAL  TROPONIN I  TROPONIN I  TROPONIN I   Imaging Review No results found.    MDM   Final diagnoses:  Altered mental status  Hepatic encephalopathy  Seizure     Seizure with altered mental status. I do not think that this is  related to his hyponatremia. It is more likely that he has not been taking his Keppra. Review of notes, EPIC, indicate that he was in a nursing home 04/03/2013 after hospital discharge on 03/31/2013. Lactate elevation is nonspecific. I doubt that he has an acute infection. It is not clear if he was discharged, left AMA or was transferred. He cannot give his history. Patient needs admission for further observation and treatment.  Nursing Notes Reviewed/ Care Coordinated, and agree without changes. Applicable Imaging Reviewed.  Interpretation of Laboratory Data incorporated into ED treatment  Plan: Admit   Richarda Blade, MD 04/06/13 1534

## 2013-04-07 ENCOUNTER — Observation Stay (HOSPITAL_COMMUNITY): Payer: Medicare Other

## 2013-04-07 LAB — COMPREHENSIVE METABOLIC PANEL
ALT: 43 U/L (ref 0–53)
AST: 58 U/L — AB (ref 0–37)
Albumin: 2.7 g/dL — ABNORMAL LOW (ref 3.5–5.2)
Alkaline Phosphatase: 167 U/L — ABNORMAL HIGH (ref 39–117)
BILIRUBIN TOTAL: 4.4 mg/dL — AB (ref 0.3–1.2)
BUN: 13 mg/dL (ref 6–23)
CALCIUM: 8.4 mg/dL (ref 8.4–10.5)
CHLORIDE: 95 meq/L — AB (ref 96–112)
CO2: 25 mEq/L (ref 19–32)
Creatinine, Ser: 0.72 mg/dL (ref 0.50–1.35)
GFR calc Af Amer: >90 mL/min (ref >90)
GFR calc non Af Amer: >90 mL/min (ref >90)
Glucose, Bld: 141 mg/dL — ABNORMAL HIGH (ref 70–99)
Potassium: 4.3 mEq/L (ref 3.7–5.3)
SODIUM: 130 meq/L — AB (ref 137–147)
Total Protein: 6.9 g/dL (ref 6.0–8.3)

## 2013-04-07 LAB — GLUCOSE, CAPILLARY
GLUCOSE-CAPILLARY: 120 mg/dL — AB (ref 70–99)
GLUCOSE-CAPILLARY: 146 mg/dL — AB (ref 70–99)

## 2013-04-07 LAB — TROPONIN I: Troponin I: <0.3 ng/mL (ref <0.30)

## 2013-04-07 NOTE — Progress Notes (Signed)
Clinical Social Work Department BRIEF PSYCHOSOCIAL ASSESSMENT 04/07/2013  Patient:  Tony Boyd, Tony Boyd     Account Number:  [id number]     Admit date:  04/06/2013  Clinical Social Worker:  Renold Genta  Date/Time:  04/07/2013 02:49 PM  Referred by:  Physician  Date Referred:  04/07/2013 Referred for  Other - See comment   Other Referral:   SNF placement?   Interview type:  Patient Other interview type:   and patient's daughter, Tony Boyd via phone    PSYCHOSOCIAL DATA Living Status:  ALONE Admitted from facility:   Level of care:   Primary support name:  [name] (daughter) c#: [phone] Primary support relationship to patient:  CHILD, ADULT Degree of support available:   good    CURRENT CONCERNS Current Concerns  Post-Acute Placement   Other Concerns:    SOCIAL WORK ASSESSMENT / PLAN CSW received consult for nursing home placement, homeless issues.   Assessment/plan status:  No Further Intervention Required Other assessment/ plan:   Information/referral to community resources:    PATIENT'S/FAMILY'S RESPONSE TO PLAN OF CARE: CSW reviewed PT/OT evaluation indicating that patient is not appropriate for SNF placement, patient was able to ambulate around the unit without assistance.    Patient was discharged to [facility] Thursday, 2/5 and signed himself out [place] on Sunday, 04/05/13. Patient reported to the doctor that the facility released him because he was doing "too well". CSW confirmed with [name] @ [facility] that he left AMA, they made every effort to keep him but he insisted on leaving. Patient was recently evicted from his apartment at [place].    CSW spoke with patient and daughter, Tony Boyd re: discharge plans. CSW informed them that patient does not qualify for SNF stay and patient is no longer meeting criteria to stay in the hospital. Daughter states that she will pick patient up when she gets off work this evening and take patient to go  hotel until his new apartment is ready. Per Tony Boyd, patient's other daughter, [name] has made arrangements for a new apartment for him off of [employer] (?) though she could not recall the name of the apartment complex.    RNCM, [name] and Dr. [name] made aware of patient's discharge plans.       [name], [facility] Clinical Social Worker cell #: [phone]

## 2013-04-07 NOTE — Evaluation (Signed)
Physical Therapy Evaluation Patient Details Name: Tony Boyd MRN: [id number] DOB: [birth year] Today's Date: 04/07/2013 Time: 4825-0037 PT Time Calculation (min): 19 min  PT Assessment / Plan / Recommendation History of Present Illness  58 yo male admitted with hepatic encephalopathy. Hx of bipolar d/o, Hep C, alcoholic cirrhosis, seizure, frequent admissions  Clinical Impression  Pt ambulating without assistance to day. Recommend  24/7 supervision.  No f/u PT recommended.     PT Assessment  Patient needs continued PT services    Follow Up Recommendations       Does the patient have the potential to tolerate intense rehabilitation      Barriers to Discharge Decreased caregiver support      Equipment Recommendations       Recommendations for Other Services     Frequency Min 3X/week    Precautions / Restrictions Precautions Precautions: Fall Restrictions Weight Bearing Restrictions: No   Pertinent Vitals/Pain       Mobility  Bed Mobility Overal bed mobility: Independent Supine to sit: Independent Sit to supine: Independent General bed mobility comments: cues for safety and for  Transfers Overall transfer level: Modified independent Sit to Stand: Independent General transfer comment: close guard for safety Ambulation/Gait Ambulation/Gait assistance: Supervision Ambulation Distance (Feet): 400 Feet Assistive device: None Gait velocity: WFL General Gait Details: wide, waddle gait.    Exercises     PT Diagnosis: Difficulty walking  PT Problem List: Decreased balance;Decreased safety awareness PT Treatment Interventions: Balance training     PT Goals(Current goals can be found in the care plan section) Acute Rehab PT Goals Patient Stated Goal: i am going to an apt. PT Goal Formulation: With patient Time For Goal Achievement: 04/21/13 Potential to Achieve Goals: Good  Visit Information  Last PT Received On: 04/07/13 Assistance Needed: +1 History of  Present Illness: 58 yo male admitted with hepatic encephalopathy. Hx of bipolar d/o, Hep C, alcoholic cirrhosis, seizure, frequent admissions       Prior Functioning  Home Living Family/patient expects to be discharged to:: Unsure Living Arrangements: Children Type of Home: Apartment Additional Comments: daughters are trying to get pt an apt. Prior Function Level of Independence: Independent with assistive device(s) Communication Communication: No difficulties Dominant Hand: Right    Cognition  Cognition Arousal/Alertness: Awake/alert Behavior During Therapy: WFL for tasks assessed/performed Overall Cognitive Status: Impaired/Different from baseline Area of Impairment: Safety/judgement;Awareness General Comments: pt is oriented to palce and time.     Extremity/Trunk Assessment Upper Extremity Assessment Upper Extremity Assessment: Generalized weakness (RUE 4/5; LUE 3+/5) Lower Extremity Assessment Lower Extremity Assessment: Overall WFL for tasks assessed Cervical / Trunk Assessment Cervical / Trunk Assessment: Normal   Balance Balance Overall balance assessment: Needs assistance Standing balance support: No upper extremity supported Standing balance-Leahy Scale: Good High level balance activites: Sudden stops;Turns;Direction changes High Level Balance Comments: pt  did not have any overt  balance loss. manged in bathroom unassisted.  End of Session PT - End of Session Equipment Utilized During Treatment: Gait belt Activity Tolerance: Patient tolerated treatment well Patient left: in bed;with call bell/phone within reach;with bed alarm set  GP     Claretha Cooper 04/07/2013, 12:04 PM Tresa Endo PT [phone]

## 2013-04-07 NOTE — Progress Notes (Addendum)
Note: This document was prepared with digital dictation and possible smart phrase technology. Any transcriptional errors that result from this process are unintentional.   Tony Boyd ZOX:[id number] DOB: 04-Mar-1955 DOA: 04/06/2013 PCP: Hollace Kinnier, DO  Brief narrative: 58 y.o. 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, hypothyroidism, uncontrolled diabetes mellitus type 2, admitted once again 04/06/2013 with Toxic metabolic encephalopathy Patient has been admitted 8 times since August 2014 with similar presentation of acute encephalopathy  Past medical history-As per Problem list Chart reviewed as below-   Consultants:  Psychiatry  Procedures:  Pending ultrasound abdomen  Antibiotics:  Rifaximin chronically   Subjective  Looks well ambulated around the whole unit Needed no assistance other than contact guard Tolerating diet At baseline No fever no chills Diarrhea associated with lactulose   Objective    Interim History:   Telemetry:    Objective: Filed Vitals:   04/06/13 1543 04/06/13 2200 04/07/13 0500 04/07/13 1254  BP: 119/57 115/60 117/59 105/50  Pulse: 56 68 65 59  Temp: 98.4 F (36.9 C) 98.7 F (37.1 C) 98.5 F (36.9 C) 97.9 F (36.6 C)  TempSrc: Oral Oral Oral Oral  Resp: 18 16 16    Height: 5\' 8"  (1.727 m)     Weight: 100.789 kg (222 lb 3.2 oz)  97.5 kg (214 lb 15.2 oz)   SpO2: 99% 99% 99% 98%    Intake/Output Summary (Last 24 hours) at 04/07/13 1522 Last data filed at 04/07/13 1013  Gross per 24 hour  Intake      0 ml  Output   3025 ml  Net  -3025 ml    Exam:  General: EOMI, NCAT Cardiovascular: S1-S2 no murmur rub or  gallop Respiratory: Clinically clear Abdomen: Distended no rebound no fluid thrill Skin lower extremities covered with compression stockings not examined today Neuro intact  Data Reviewed: Basic Metabolic Panel:  Recent Labs Lab 04/06/13 1235  NA 119*  K 4.9  CL 86*  CO2 20  GLUCOSE 152*  BUN 12  CREATININE 0.58  CALCIUM 8.0*   Liver Function Tests:  Recent Labs Lab 04/06/13 1235  AST 70*  ALT 45  ALKPHOS 173*  BILITOT 4.6*  PROT 7.3  ALBUMIN 2.8*   No results found for this basename: LIPASE, AMYLASE,  in the last 168 hours  Recent Labs Lab 04/06/13 1235  AMMONIA 69*   CBC:  Recent Labs Lab 04/06/13 1235 04/06/13 1340 04/06/13 1645  WBC SPECIMEN CLOTTED NOTIFIED LISA IN ED 2.9.15 AT 1330 BY W.SHEA,MT. 4.9 4.8  NEUTROABS PENDING 3.8 3.6  HGB SPECIMEN CLOTTED NOTIFIED LISA IN ED 2.9.15 AT 1330 BY W.SHEA,MT. 11.4* 11.3*  HCT SPECIMEN CLOTTED NOTIFIED LISA IN ED 2.9.15 AT 1330 BY W.SHEA,MT. 31.4* 31.6*  MCV SPECIMEN CLOTTED NOTIFIED LISA IN ED 2.9.15 AT 1330 BY W.SHEA,MT. 96.9 97.2  PLT SPECIMEN CLOTTED NOTIFIED LISA IN ED 2.9.15 AT 1330 BY W.SHEA,MT. 43* 43*   Cardiac Enzymes:  Recent Labs Lab 04/06/13 1645 04/06/13 2240 04/07/13 0420  TROPONINI <0.30 <0.30 <0.30   BNP: No components found with this basename: POCBNP,  CBG:  Recent Labs Lab 04/01/13 1628 04/01/13 2109 04/02/13 0734 04/02/13 1107 04/07/13 0805  GLUCAP 145* 153* 107* 170* 120*    No results found for this or any  previous visit (from the past 240 hour(s)).   Studies:              All Imaging reviewed and is as per above notation   Scheduled Meds: . ferrous sulfate  325 mg Oral QHS  . folic acid  1 mg Oral q morning - 10a  . furosemide  40 mg Oral BID  . insulin glargine  12 Units Subcutaneous QHS  . lactulose  45 g Oral TID  . levETIRAcetam  750 mg Oral BID  . levothyroxine  75 mcg Oral QAC breakfast  . nadolol  40 mg Oral q morning - 10a  . pantoprazole  40 mg Oral  Daily  . rifaximin  550 mg Oral BID  . sodium chloride  3 mL Intravenous Q12H  . spironolactone  200 mg Oral q morning - 10a   Continuous Infusions:    Assessment/Plan: 1. Toxic metabolic encephalopathy related to hepatic encephalopathy-resolving-continue lactulose.  Patient has demonstrated over the past 6 months noncompliance with medical therapy, continued ethanol use despite counseling regarding cessation and represents to the hospital in this state. I contacted patient's primary care physician who has concerns of Korsakoff psychosis and patient's ability to make coherent and sound judgment-given this history, I feel it is appropriate to have psychiatry evaluate the patient as secondary physician for capacity.  contine Lactulose 45 tid [till diarrhea], Rifaximin 2. End-stage liver disease PJAS=50-[id number] cirrhosis complicated by hepatitis C-none candidate for anti-hepatitis treatment [Sofusbuvir] given demonstrated poor compliance, not a candidate for transplant given continued ethanol use-get CMET.  Rpt labs am [INR] 3. Esophageal varices- Nadolol 40 mg.  Monitor 4. Anemia + TCP 2/2 to #2-macrocytic-On iron-Replace B12, folate 5. Mod-severe tcp 2/2 to #2 6. DM ty 2-Insulin 12 units, cont SSI 7. Severe PEM-Get nutrition input 8. Elevated AFP-concern for 1ry Liver Mass 2/2 to #2-AFP in dec was 200's.  [place] Abd 2/9 shows no focal mass.  OP follow up v GI  Code Status: Full Family Communication: Have tried to contact family today-no response.   Disposition Plan: await psych capacity, [place]   Verneita Griffes, MD  [facility] Pager [phone] 04/07/2013, 3:22 PM    LOS: 1 day

## 2013-04-07 NOTE — Evaluation (Signed)
Occupational Therapy Evaluation Patient Details Name: Tony Boyd MRN: [id number] DOB: August 05, 1955 Today's Date: 04/07/2013 Time: 2951-8841 OT Time Calculation (min): 20 min  OT Assessment / Plan / Recommendation History of present illness 58 yo male admitted with hepatic encephalopathy. Hx of bipolar d/o, Hep C, alcoholic cirrhosis, seizure, frequent admissions   Clinical Impression   Pt is known to OT from previous admissions.  He presents at min guard level for adls.  He will benefit from skilled OT to increase safety and balance for adls.  Goals in acute are for supervision level    OT Assessment  Patient needs continued OT Services    Follow Up Recommendations  Home health OT (depending upon progress)    Barriers to Discharge      Equipment Recommendations   (pt has tub seat if he can access it--evicted from last apt)    Recommendations for Other Services    Frequency  Min 2X/week    Precautions / Restrictions Precautions Precautions: Fall Restrictions Weight Bearing Restrictions: No   Pertinent Vitals/Pain C/o headache but states he was premedicated    ADL  Grooming: Teeth care;Supervision/safety Where Assessed - Grooming: Supported standing Lower Body Bathing: Min guard Where Assessed - Lower Body Bathing: Unsupported sit to stand Lower Body Dressing: Min guard Where Assessed - Lower Body Dressing: Unsupported sit to stand Toilet Transfer: Min Psychiatric nurse Method: Sit to Loss adjuster, chartered: Comfort height toilet;Grab bars Transfers/Ambulation Related to ADLs: ambulated to bathroom with min guard assist ADL Comments: UB adls with set up; LB with min guard.   Some word finding difficulties and slow processing at times--pt states he slept poorly for last 2 nights   OT Diagnosis: Generalized weakness  OT Problem List: Decreased strength;Decreased activity tolerance;Impaired balance (sitting and/or standing);Decreased knowledge of use of DME or  AE OT Treatment Interventions: Self-care/ADL training;DME and/or AE instruction;Patient/family education;Balance training   OT Goals(Current goals can be found in the care plan section) Acute Rehab OT Goals Patient Stated Goal: none stated OT Goal Formulation: With patient Time For Goal Achievement: 04/21/13 Potential to Achieve Goals: Good ADL Goals Pt Will Transfer to Toilet: with supervision;ambulating;bedside commode;regular height toilet Pt Will Perform Tub/Shower Transfer: with min guard assist;tub bench;shower seat;Tub transfer Additional ADL Goal #1: pt will gather clothes with supervision and complete ADL without cues  Visit Information  Last OT Received On: 04/07/13 Assistance Needed: +1 History of Present Illness: 58 yo male admitted with hepatic encephalopathy. Hx of bipolar d/o, Hep C, alcoholic cirrhosis, seizure, frequent admissions       Prior Functioning     Home Living Additional Comments: pt states he was evicted and will move to a new apt on [address].  He has lived in that complex before.  All of his DME is at apt is he locked out of Prior Function Level of Independence: Independent with assistive device(s) Communication Communication: No difficulties Dominant Hand: Right         Vision/Perception     Cognition  Cognition Arousal/Alertness: Awake/alert Behavior During Therapy: WFL for tasks assessed/performed Overall Cognitive Status: Impaired/Different from baseline (slow processing and speech; didn't sleep well)    Extremity/Trunk Assessment Upper Extremity Assessment Upper Extremity Assessment: Generalized weakness (RUE 4/5; LUE 3+/5)     Mobility Bed Mobility Supine to sit: Supervision Sit to supine: Supervision General bed mobility comments: HOB raised Transfers Sit to Stand: Min guard General transfer comment: close guard for safety     Exercise  Balance     End of Session OT - End of Session Activity Tolerance: Patient  tolerated treatment well Patient left: in bed;with call bell/phone within reach;with bed alarm set  [place] 04/07/2013, 9:44 AM Lesle Chris, OTR/L [phone] 04/07/2013

## 2013-04-08 DIAGNOSIS — F1010 Alcohol abuse, uncomplicated: Secondary | ICD-10-CM

## 2013-04-08 LAB — COMPREHENSIVE METABOLIC PANEL
ALK PHOS: 176 U/L — AB (ref 39–117)
ALT: 43 U/L (ref 0–53)
AST: 58 U/L — ABNORMAL HIGH (ref 0–37)
Albumin: 2.8 g/dL — ABNORMAL LOW (ref 3.5–5.2)
BILIRUBIN TOTAL: 2.9 mg/dL — AB (ref 0.3–1.2)
BUN: 14 mg/dL (ref 6–23)
CHLORIDE: 94 meq/L — AB (ref 96–112)
CO2: 22 mEq/L (ref 19–32)
Calcium: 8 mg/dL — ABNORMAL LOW (ref 8.4–10.5)
Creatinine, Ser: 0.73 mg/dL (ref 0.50–1.35)
GFR calc non Af Amer: >90 mL/min (ref >90)
GLUCOSE: 116 mg/dL — AB (ref 70–99)
Potassium: 4.1 mEq/L (ref 3.7–5.3)
Sodium: 127 mEq/L — ABNORMAL LOW (ref 137–147)
Total Protein: 6.9 g/dL (ref 6.0–8.3)

## 2013-04-08 LAB — CBC
HEMATOCRIT: 33.5 % — AB (ref 39.0–52.0)
HEMOGLOBIN: 11.4 g/dL — AB (ref 13.0–17.0)
MCH: 34.8 pg — ABNORMAL HIGH (ref 26.0–34.0)
MCHC: 34 g/dL (ref 30.0–36.0)
MCV: 102.1 fL — ABNORMAL HIGH (ref 78.0–100.0)
Platelets: 40 10*3/uL — ABNORMAL LOW (ref 150–400)
RBC: 3.28 MIL/uL — ABNORMAL LOW (ref 4.22–5.81)
RDW: 15.2 % (ref 11.5–15.5)
WBC: 5.4 10*3/uL (ref 4.0–10.5)

## 2013-04-08 LAB — PROTIME-INR
INR: 2.16 — ABNORMAL HIGH (ref 0.00–1.49)
Prothrombin Time: 23.4 seconds — ABNORMAL HIGH (ref 11.6–15.2)

## 2013-04-08 LAB — GLUCOSE, CAPILLARY: Glucose-Capillary: 124 mg/dL — ABNORMAL HIGH (ref 70–99)

## 2013-04-08 MED ORDER — GLUCERNA SHAKE PO LIQD
237.0000 mL | ORAL | Status: DC
  Administered 2013-04-09 – 2013-04-11 (×3): 237 mL via ORAL
  Filled 2013-04-08 (×3): qty 237

## 2013-04-08 NOTE — Progress Notes (Signed)
Called by SW in room, pt is having seizure which lasted about 45sec. VSS, pt is alert & oriented post seizure. Will cont to monitor. MD notified.

## 2013-04-08 NOTE — Progress Notes (Addendum)
INITIAL NUTRITION ASSESSMENT  DOCUMENTATION CODES Per approved criteria  -Non-severe (moderate) malnutrition in the context of chronic illness -Obesity Unspecified  Pt meets criteria for moderate MALNUTRITION in the context of chronic illness as evidenced by 12% weight loss in one month period, moderate subcutaneous fat loss and muscle wasting, and likely <75% PO intake for > one month.   INTERVENTION: Recommend Glucerna once daily Encouraged balanced meals and snacks for weight management and blood glucose control Recommend heart healthy diet recommendations Will continue to monitor  NUTRITION DIAGNOSIS: Unintentional wt loss related to inadequate oral intake as evidenced by 30 lbs wt loss in one month   Goal: Pt to meet >/= 90% of their estimated nutrition needs    Monitor:  Total protein/energy intake, labs, weights  Reason for Assessment:  Consult to Assess  58 y.o. male  Admitting Dx: Toxic metabolic encephalopathy  ASSESSMENT: Tony Boyd is a 58 y.o. 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 He got a pizza and apparentyl per daughter drank 3-4 beers [unclear what size]. Patient has most previously been at the nursing home but was allegedly discharge because he was doing "too well" did not meet skilled nursing facility needs   -Pt reported unintentional and intentional wt loss of 30 lbs over two week-one month period during stay at rehab facility. Pt's goal weight is 200 lbs.  -Diet recall showed pt skipped  breakfast, would eat lunch and dinner. Noted that facility had pt comply to DM diet. Snacks included largely fruits. Drinks one Ensure daily -Daughters assist in meal preparation for pt while at home. Pt consuming large amount of highly processed foods- TV dinners, freezer breakfast items. Denied any difficulty controlling blood sugars as daughter reportedly checks them on regular basis -Is eating 100% of meals. Did not understand some aspect of heart healthy diet recommendations. Encouraged pt decrease intake of processed meals and limit salt intake. Is unlikely pt will comply as pt appeared slightly confused and denied intake of highly salted food items -Requesting to received Ensure once daily during admit. D/t hx of DM, will modify to Glucerna as pt with excellent PO intake -Acute encephalopathy improving per MD. Protein restriction not warranted at this time -Pt noted moderate muscle atrophy in lower extremities d/t limited mobility post rehab  Nutrition Focused Physical Exam:  Subcutaneous Fat:  Orbital Region: moderate Upper Arm Region: WNL Thoracic and Lumbar Region: WNL  Muscle:  Temple Region: moderate Clavicle Bone Region: WNL Clavicle and Acromion Bone Region: WNL Scapular Bone Region: WNL Dorsal Hand: WNL Patellar Region: WNL Anterior Thigh Region: moderate Posterior Calf Region: moderate  Edema: N/A      Height: Ht Readings from Last 1 Encounters:  04/06/13 5\' 8"  (1.727 m)    Weight: Wt Readings from Last 1 Encounters:  04/08/13 212 lb 8.4 oz (96.4 kg)    Ideal Body Weight: 154 lbs  % Ideal Body Weight: 138%  Wt Readings from Last 10 Encounters:  04/08/13 212 lb 8.4 oz (96.4 kg)  04/02/13 218 lb 9.6 oz (99.156 kg)  03/05/13 266 lb 6.4 oz (120.838 kg)  02/25/13 283 lb 8 oz (128.595 kg)  02/11/13 254 lb (115.214 kg)  01/12/13 241 lb 2.9 oz (109.4 kg)  01/08/13 245 lb (111.131 kg)  12/28/12 228 lb 13.4 oz (103.8 kg)  12/11/12 258 lb 11.2 oz (117.346 kg)   11/12/12 281 lb 12 oz (127.8 kg)    Usual Body Weight: 242 lbs  % Usual Body Weight: 88%  BMI:  Body mass index is 32.32 kg/(m^2). Obesity I  Estimated Nutritional Needs: Kcal: 2000-2200 Protein: 100-110 Fluid: 1.5-1.8 L daily  Skin: WDL  Diet Order: Cardiac  EDUCATION NEEDS: -Education needs addressed   Intake/Output Summary (Last 24 hours) at 04/08/13 1039 Last data filed at 04/08/13 0017  Gross per 24 hour  Intake    480 ml  Output    400 ml  Net     80 ml    Last BM: 2/10   Labs:   Recent Labs Lab 04/06/13 1235 04/07/13 1520 04/08/13 0400  NA 119* 130* 127*  K 4.9 4.3 4.1  CL 86* 95* 94*  CO2 20 25 22   BUN 12 13 14   CREATININE 0.58 0.72 0.73  CALCIUM 8.0* 8.4 8.0*  GLUCOSE 152* 141* 116*    CBG (last 3)   Recent Labs  04/07/13 0805 04/07/13 2210 04/08/13 0750  GLUCAP 120* 146* 124*    Scheduled Meds: . ferrous sulfate  325 mg Oral QHS  . folic acid  1 mg Oral q morning - 10a  . furosemide  40 mg Oral BID  . insulin glargine  12 Units Subcutaneous QHS  . lactulose  45 g Oral TID  . levETIRAcetam  750 mg Oral BID  . levothyroxine  75 mcg Oral QAC breakfast  . nadolol  40 mg Oral q morning - 10a  . pantoprazole  40 mg Oral Daily  . rifaximin  550 mg Oral BID  . sodium chloride  3 mL Intravenous Q12H  . spironolactone  200 mg Oral q morning - 10a    Continuous Infusions:   Past Medical History  Diagnosis Date  . Cirrhosis of liver   . Hepatitis C   . ETOH abuse   . Hypothyroid   . Psychosis   . Bipolar disorder   . Seizures   . Arthritis   . Back pain   . Coagulopathy     Hx of  . Thrombocytopenia     Hx of  . Pancytopenia     Hx of  . H/O hypokalemia   . Hyponatremia     Hx of  . Korsakoff psychosis   . H/O abdominal abscess   . H/O esophageal varices   . Metabolic encephalopathy   . Hepatic encephalopathy   . Urinary urgency   . H/O renal failure   . H/O ascites   . Altered mental status   . Anemia   .  Ascites   . Shortness of breath   . Peripheral vascular disease   . GERD (gastroesophageal reflux disease)   . Thrombocytopenia, acquired 06/02/2012  . Unspecified hypothyroidism 06/02/2012  . Pain in joint, pelvic region and thigh   . Edema   . Obesity, unspecified   . Chest pain, unspecified   . Pneumonitis due to inhalation of food or vomitus   . Other convulsions   . Cellulitis and abscess of upper arm and forearm   . Chronic hepatitis C without mention of hepatic coma   . Hypopotassemia   . Anemia, unspecified   . Other and unspecified coagulation defects   . Thrombocytopenia, unspecified   .  Bipolar I disorder, most recent episode (or current) unspecified   . Unspecified psychosis   . Encephalopathy   . Esophageal varices without mention of bleeding   . Esophagitis, unspecified   . Abscess of liver(572.0)   . Chronic kidney disease, unspecified   . Lumbago   . Personal history of alcoholism   . Personal history of tobacco use, presenting hazards to health   . Cirrhosis of liver without mention of alcohol   . Pneumonitis due to inhalation of food or vomitus 09/19/2010  . Chronic hepatitis C without mention of hepatic coma   . Bipolar I disorder, most recent episode (or current) unspecified   . Unspecified psychosis   . Other ascites   . Personal history of tobacco use, presenting hazards to health   . Cirrhosis of liver without mention of alcohol   . Type 2 diabetes mellitus with diabetic neuropathy     Past Surgical History  Procedure Laterality Date  . Colostomy    . Cholecystectomy    . Small intestine surgery    . Arm surgery      left arm  . US guided drain placement in ventral hernia abscess  07/21/2010  . Multiple picc line placements    . Esophagogastroduodenoscopy  06/28/2011    Procedure: ESOPHAGOGASTRODUODENOSCOPY (EGD);  Surgeon: Inda Castle, MD;  Location: [facility];  Service: Endoscopy;  Laterality: N/A;  . Esophagogastroduodenoscopy N/A 05/06/2012     Procedure: ESOPHAGOGASTRODUODENOSCOPY (EGD);  Surgeon: Inda Castle, MD;  Location: [facility];  Service: Endoscopy;  Laterality: N/A;  . Esophageal banding N/A 05/06/2012    Procedure: ESOPHAGEAL BANDING;  Surgeon: Inda Castle, MD;  Location: [facility];  Service: Endoscopy;  Laterality: N/A;     Atlee Abide MS RD LDN Clinical Dietitian [phone]

## 2013-04-08 NOTE — Progress Notes (Signed)
PROGRESS NOTE  Tony Boyd UUV:[id number] DOB: 1955/03/22 DOA: 04/06/2013 PCP: Hollace Kinnier, DO  Assessment/Plan: Likely seizure from not taking Keppra-see below. Increased Keppra dose from 500-750 twice a day.  Toxic metabolic encephalopathy-probably related to subacute alcohol intoxication. Could also be secondary to seizure from not being compliant on his Keppra. Repeat seizure today. Obtain EEG.  Hepatic encephalopathy- resolved He has poor overall prognosis  Cirrhosis + end-stage liver disease-meld score=15.  Continue Lasix 40 mg 3 times a day, Aldactone 200 every morning, complete metabolic panel in a.m.  Esophageal varices without mention of bleeding-continue beta blocker 40 mg every morning  Thrombocytopenia, acquired-secondary to above  Diabetes mellitus type 2, uncontrolled, with complications-continue 12 units Lantus, sliding scale insulin ordered, not sure why patient is not on short acting insulin  Severe protein-calorie malnutrition-multifactorial. Nutrition input  Chronic back pain-careful implementation of oxycodone  EKG change-repeat EKG a.m. + cycle troponins as mild changes in precordial leads noted 03/1948 Hyponatremia - fluid restrict  Overall very poor prognosis; may not seem entirely determined to quit drinking, asking if its OK to have a drink for holidays or other events.   Diet: heart Fluids: none DVT Prophylaxis: SCD  Code Status: Full Family Communication: none  Disposition Plan: inpatient  Consultants:  none  Procedures:  none   Antibiotics  Anti-infectives   Start     Dose/Rate Route Frequency Ordered Stop   04/06/13 2200  rifaximin (XIFAXAN) tablet 550 mg     550 mg Oral 2 times daily 04/06/13 1616       Antibiotics Given (last 72 hours)   Date/Time Action Medication Dose   04/06/13 2216 Given   rifaximin (XIFAXAN) tablet 550 mg 550 mg   04/07/13 1059 Given   rifaximin (XIFAXAN) tablet 550 mg 550 mg   04/07/13 2300 Given   rifaximin  (XIFAXAN) tablet 550 mg 550 mg   04/08/13 1024 Given   rifaximin (XIFAXAN) tablet 550 mg 550 mg      HPI/Subjective: - feeling better  Objective: Filed Vitals:   04/07/13 2213 04/08/13 0411 04/08/13 1140 04/08/13 1424  BP: 102/53 104/52 123/64 122/64  Pulse: 63 53 62 57  Temp: 98.6 F (37 C) 97.9 F (36.6 C) 98.5 F (36.9 C) 98 F (36.7 C)  TempSrc: Oral Oral Oral Oral  Resp: 16 16 18 18   Height:      Weight:  96.4 kg (212 lb 8.4 oz)    SpO2: 98% 98%  100%    Intake/Output Summary (Last 24 hours) at 04/08/13 1737 Last data filed at 04/08/13 1425  Gross per 24 hour  Intake    720 ml  Output    600 ml  Net    120 ml   Filed Weights   04/06/13 1543 04/07/13 0500 04/08/13 0411  Weight: 100.789 kg (222 lb 3.2 oz) 97.5 kg (214 lb 15.2 oz) 96.4 kg (212 lb 8.4 oz)    Exam:   General:  NAD, ill appearing, mild scleral icterus  Cardiovascular: regular rate and rhythm, without MRG  Respiratory: good air movement, clear to auscultation throughout, no wheezing, ronchi or rales  Abdomen: soft, not tender to palpation, positive bowel sounds  MSK: no peripheral edema  Neuro: CN 2-12 grossly intact, MS 5/5 in all 4  Data Reviewed: Basic Metabolic Panel:  Recent Labs Lab 04/06/13 1235 04/07/13 1520 04/08/13 0400  NA 119* 130* 127*  K 4.9 4.3 4.1  CL 86* 95* 94*  CO2 20 25 22   GLUCOSE 152*  141* 116*  BUN 12 13 14   CREATININE 0.58 0.72 0.73  CALCIUM 8.0* 8.4 8.0*   Liver Function Tests:  Recent Labs Lab 04/06/13 1235 Apr 27, 2013 1520 04/08/13 0400  AST 70* 58* 58*  ALT 45 43 43  ALKPHOS 173* 167* 176*  BILITOT 4.6* 4.4* 2.9*  PROT 7.3 6.9 6.9  ALBUMIN 2.8* 2.7* 2.8*   No results found for this basename: LIPASE, AMYLASE,  in the last 168 hours  Recent Labs Lab 04/06/13 1235  AMMONIA 69*   CBC:  Recent Labs Lab 04/06/13 1235 04/06/13 1340 04/06/13 1645 04/08/13 0400  WBC SPECIMEN CLOTTED NOTIFIED LISA IN ED 2.9.15 AT 1330 BY W.SHEA,MT. 4.9 4.8  5.4  NEUTROABS PENDING 3.8 3.6  --   HGB SPECIMEN CLOTTED NOTIFIED [facility] IN ED 2.9.15 AT 1330 BY W.SHEA,MT. 11.4* 11.3* 11.4*  HCT SPECIMEN CLOTTED NOTIFIED [facility] IN ED 2.9.15 AT 1330 BY W.SHEA,MT. 31.4* 31.6* 33.5*  MCV SPECIMEN CLOTTED NOTIFIED [facility] IN ED 2.9.15 AT 1330 BY W.SHEA,MT. 96.9 97.2 102.1*  PLT SPECIMEN CLOTTED NOTIFIED LISA IN ED 2.9.15 AT 1330 BY W.SHEA,MT. 43* 43* 40*   Cardiac Enzymes:  Recent Labs Lab 04/06/13 1645 04/06/13 2240 April 27, 2013 0420  TROPONINI <0.30 <0.30 <0.30   BNP (last 3 results)  Recent Labs  12/03/12 1435 01/11/13 2055 02/18/13 0234  PROBNP 297.7* 207.4* 985.8*   CBG:  Recent Labs Lab 04/02/13 0734 04/02/13 1107 27-Apr-2013 0805 2013-04-27 2210 04/08/13 0750  GLUCAP 107* 170* 120* 146* 124*    No results found for this or any previous visit (from the past 240 hour(s)).   Studies: US Abdomen Limited Ruq  04/27/2013   CLINICAL DATA:  Liver mass.  Cholecystectomy.  History of cirrhosis.  EXAM: US ABDOMEN LIMITED - RIGHT UPPER QUADRANT  COMPARISON:  CT ABD/PELVIS W CM dated 02/18/2013  FINDINGS: Gallbladder:  Cholecystectomy.  Common bile duct:  Diameter: 7 mm, within normal limits status post cholecystectomy.  Liver:  No discrete mass identified by ultrasound. Nodular contour compatible with hepatic cirrhosis. Diffusely increased echotexture.  IMPRESSION: 1. Hepatic cirrhosis without a focal mass identified. 2. Cholecystectomy.   Electronically Signed   By: Dereck Ligas M.D.   On: 04-27-13 00:58    Scheduled Meds: . [START ON 04/09/2013] feeding supplement (GLUCERNA SHAKE)  237 mL Oral Q24H  . ferrous sulfate  325 mg Oral QHS  . folic acid  1 mg Oral q morning - 10a  . furosemide  40 mg Oral BID  . insulin glargine  12 Units Subcutaneous QHS  . lactulose  45 g Oral TID  . levETIRAcetam  750 mg Oral BID  . levothyroxine  75 mcg Oral QAC breakfast  . nadolol  40 mg Oral q morning - 10a  . pantoprazole  40 mg Oral Daily  . rifaximin  550  mg Oral BID  . sodium chloride  3 mL Intravenous Q12H  . spironolactone  200 mg Oral q morning - 10a   Continuous Infusions:   Principal Problem:   Toxic metabolic encephalopathy Active Problems:   Hepatic encephalopathy   Cirrhosis   Esophageal varices without mention of bleeding   Thrombocytopenia, acquired   Diabetes mellitus type 2, uncontrolled, with complications   Severe protein-calorie malnutrition   Chronic back pain   Time spent: [place] [facility] Pager [phone]. If 7 PM - 7 AM, please contact night-coverage at [email or web address], password [facility] 04/08/2013, 5:37 PM  LOS: 2 days

## 2013-04-08 NOTE — Consult Note (Signed)
[facility] Face-to-Face Psychiatry Consult   Reason for Consult:  Capacity Referring Physician:  Dr Lanier Clam is an 58 y.o. male. Total Time spent with patient: 30 minutes  Assessment: AXIS I:  Substance Abuse and Alcohol abuse AXIS II:  Deferred AXIS III:   Past Medical History  Diagnosis Date  . Cirrhosis of liver   . Hepatitis C   . ETOH abuse   . Hypothyroid   . Psychosis   . Bipolar disorder   . Seizures   . Arthritis   . Back pain   . Coagulopathy     Hx of  . Thrombocytopenia     Hx of  . Pancytopenia     Hx of  . H/O hypokalemia   . Hyponatremia     Hx of  . Korsakoff psychosis   . H/O abdominal abscess   . H/O esophageal varices   . Metabolic encephalopathy   . Hepatic encephalopathy   . Urinary urgency   . H/O renal failure   . H/O ascites   . Altered mental status   . Anemia   . Ascites   . Shortness of breath   . Peripheral vascular disease   . GERD (gastroesophageal reflux disease)   . Thrombocytopenia, acquired 06/02/2012  . Unspecified hypothyroidism 06/02/2012  . Pain in joint, pelvic region and thigh   . Edema   . Obesity, unspecified   . Chest pain, unspecified   . Pneumonitis due to inhalation of food or vomitus   . Other convulsions   . Cellulitis and abscess of upper arm and forearm   . Chronic hepatitis C without mention of hepatic coma   . Hypopotassemia   . Anemia, unspecified   . Other and unspecified coagulation defects   . Thrombocytopenia, unspecified   . Bipolar I disorder, most recent episode (or current) unspecified   . Unspecified psychosis   . Encephalopathy   . Esophageal varices without mention of bleeding   . Esophagitis, unspecified   . Abscess of liver(572.0)   . Chronic kidney disease, unspecified   . Lumbago   . Personal history of alcoholism   . Personal history of tobacco use, presenting hazards to health   . Cirrhosis of liver without mention of alcohol   . Pneumonitis due to inhalation of food or  vomitus 09/19/2010  . Chronic hepatitis C without mention of hepatic coma   . Bipolar I disorder, most recent episode (or current) unspecified   . Unspecified psychosis   . Other ascites   . Personal history of tobacco use, presenting hazards to health   . Cirrhosis of liver without mention of alcohol   . Type 2 diabetes mellitus with diabetic neuropathy    AXIS IV:  other psychosocial or environmental problems, problems related to social environment and problems with primary support group AXIS V:  61-70 mild symptoms  Plan:  No evidence of imminent risk to self or others at present.   Patient does not meet criteria for psychiatric inpatient admission. Supportive therapy provided about ongoing stressors. Discussed crisis plan, support from social network, calling 911, coming to the Emergency Department, and calling Suicide Hotline.  Subjective:   Tony Boyd is a 58 y.o. male patient admitted with seizures and change in mental status.Tony Boyd  HPI:  Patient seen chart reviewed.  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  Consult was called for capacity.  Patient has multiple hospitalization because of toxic encephalopathy. He was recently discharged to a skilled nursing facility however he signed out [place] on Sunday.  Patient reported that he was feeling very sad depressed lonely and no one came to visit him.  He admitted drinking again.  Patient endorses history of drinking since age 77 and he has been in several detox and rehabilitation program.  His last rehabilitation program 12 years ago.  Patient endorsed his last drinking while Sunday but did not remember the amount.  Patient denies any depression, suicidal thought, hallucinations, paranoia or any aggression.  He understands that he has chronic alcohol issues and he like to stop he  does not need any treatment.  Patient does not want to go to the skilled nursing facility.  He is working with his daughter so that he can get his own place.  Patient does not want to see a psychiatrist.  He is getting his medication from his primary care physician.  Patient admitted history of PTSD when he was serving in [place] and at that time he saw psychiatrist.  Patient denies any history of psychiatric inpatient treatment, suicidal attempt, or any mania.  He admitted some time having visual hallucination that he is drinking heavily.  Today patient is calm cooperative and relevant.  He does not want to go to a skilled nursing facility.  He felt that he was getting more depressed there.  His memory is good.  He has no tremors or shakes at this time.  He admitted difficulty remembering things but he was able to answer the question about recent memory very well.  He is currently on disability.  He denies any suicidal thoughts or homicidal thoughts. HPI Elements:   Location:  Drinking alcohol,. Quality:  Understand his health issues. Severity:  Mild.  Past Psychiatric History: Past Medical History  Diagnosis Date  . Cirrhosis of liver   . Hepatitis C   . ETOH abuse   . Hypothyroid   . Psychosis   . Bipolar disorder   . Seizures   . Arthritis   . Back pain   . Coagulopathy     Hx of  . Thrombocytopenia     Hx of  . Pancytopenia     Hx of  . H/O hypokalemia   . Hyponatremia     Hx of  . Korsakoff psychosis   . H/O abdominal abscess   . H/O esophageal varices   . Metabolic encephalopathy   . Hepatic encephalopathy   . Urinary urgency   . H/O renal failure   . H/O ascites   . Altered mental status   . Anemia   . Ascites   . Shortness of breath   . Peripheral vascular disease   . GERD (gastroesophageal reflux disease)   . Thrombocytopenia, acquired 06/02/2012  . Unspecified hypothyroidism 06/02/2012  . Pain in joint, pelvic region and thigh   . Edema   . Obesity, unspecified   .  Chest pain, unspecified   . Pneumonitis due to inhalation of food or vomitus   . Other convulsions   . Cellulitis and abscess of upper arm and forearm   . Chronic hepatitis C without mention of hepatic coma   . Hypopotassemia   . Anemia, unspecified   . Other and unspecified coagulation defects   . Thrombocytopenia, unspecified   . Bipolar I disorder, most recent episode (or current) unspecified   . Unspecified psychosis   .  Encephalopathy   . Esophageal varices without mention of bleeding   . Esophagitis, unspecified   . Abscess of liver(572.0)   . Chronic kidney disease, unspecified   . Lumbago   . Personal history of alcoholism   . Personal history of tobacco use, presenting hazards to health   . Cirrhosis of liver without mention of alcohol   . Pneumonitis due to inhalation of food or vomitus 09/19/2010  . Chronic hepatitis C without mention of hepatic coma   . Bipolar I disorder, most recent episode (or current) unspecified   . Unspecified psychosis   . Other ascites   . Personal history of tobacco use, presenting hazards to health   . Cirrhosis of liver without mention of alcohol   . Type 2 diabetes mellitus with diabetic neuropathy     reports that he quit smoking about 5 years ago. His smoking use included Cigarettes. He smoked 0.00 packs per day. He has never used smokeless tobacco. He reports that he does not drink alcohol or use illicit drugs. Family History  Problem Relation Age of Onset  . Heart disease Mother     MI  . Lung cancer Brother      Living Arrangements: Children   Abuse/Neglect [facility]) Physical Abuse: Denies Verbal Abuse: Denies Sexual Abuse: Denies Allergies:   Allergies  Allergen Reactions  . Droperidol Other (See Comments)    unknown  . Penicillins Swelling  . Toradol [Ketorolac Tromethamine] Hives    ACT Assessment Complete:  No:    Past Psychiatric History: Patient endorses history of drinking since age 75.  He has several detox  treatment but no long term rehabilitation.  He has history of seizures do to alcoholism.  He was diagnosed with PTSD when he served as a [employer]. serving in [place].  His current not seen any psychiatrist.  He is not on any psychotropic medication.  He denies any history of suicidal attempt, inpatient psychiatric treatment.  Denies any intravenous drug use, any other illegal substance use.  Social history Patient lives alone.  She has a daughter who is very supportive.  Currently he is on disability due to cirrhosis.  He has good support from his daughter.  Objective: Blood pressure 104/52, pulse 53, temperature 97.9 F (36.6 C), temperature source Oral, resp. rate 16, height 5' 8"  (1.727 m), weight 212 lb 8.4 oz (96.4 kg), SpO2 98.00%.Body mass index is 32.32 kg/(m^2). Results for orders placed during the hospital encounter of 04/06/13 (from the past 72 hour(s))  CBC WITH DIFFERENTIAL     Status: None   Collection Time    04/06/13 12:35 PM      Result Value Ref Range   WBC    4.0 - 10.5 K/uL   Value: SPECIMEN CLOTTED NOTIFIED [facility] IN ED 2.9.15 AT 1330 BY W.SHEA,MT.   RBC    4.22 - 5.81 MIL/uL   Value: SPECIMEN CLOTTED NOTIFIED [place] ED 2.9.15 AT 1330 BY W.SHEA,MT.   Hemoglobin    13.0 - 17.0 g/dL   Value: SPECIMEN CLOTTED NOTIFIED [place] ED 2.9.15 AT 1330 BY W.SHEA,MT.   HCT    39.0 - 52.0 %   Value: SPECIMEN CLOTTED NOTIFIED [facility] ED 2.9.15 AT 1330 BY W.SHEA,MT.   MCV    78.0 - 100.0 fL   Value: SPECIMEN CLOTTED NOTIFIED [facility] ED 2.9.15 AT 1330 BY W.SHEA,MT.   MCH    26.0 - 34.0 pg   Value: SPECIMEN CLOTTED NOTIFIED [facility] ED 2.9.15 AT  1330 BY W.SHEA,MT.   MCHC    30.0 - 36.0 g/dL   Value: SPECIMEN CLOTTED NOTIFIED [facility] ED 2.9.15 AT 1330 BY W.SHEA,MT.   RDW    11.5 - 15.5 %   Value: SPECIMEN CLOTTED NOTIFIED [facility] ED 2.9.15 AT 1330 BY W.SHEA,MT.   Platelets    150 - 400 K/uL   Value: SPECIMEN CLOTTED NOTIFIED [place] ED 2.9.15 AT 1330 BY W.SHEA,MT.   Neutrophils  Relative % PENDING  43 - 77 %   Neutro Abs PENDING  1.7 - 7.7 K/uL   Band Neutrophils PENDING  0 - 10 %   Lymphocytes Relative PENDING  12 - 46 %   Lymphs Abs PENDING  0.7 - 4.0 K/uL   Monocytes Relative PENDING  3 - 12 %   Monocytes Absolute PENDING  0.1 - 1.0 K/uL   Eosinophils Relative PENDING  0 - 5 %   Eosinophils Absolute PENDING  0.0 - 0.7 K/uL   Basophils Relative PENDING  0 - 1 %   Basophils Absolute PENDING  0.0 - 0.1 K/uL   LUCs, % PENDING  0 - 4 %   LUC, Absolute PENDING  0.0 - 0.5 K/uL   WBC Morphology PENDING     RBC Morphology PENDING     Smear Review PENDING     Other PENDING     Other 2 PENDING     nRBC PENDING  0 /100 WBC   Metamyelocytes Relative PENDING     Myelocytes PENDING     Promyelocytes Absolute PENDING     Blasts PENDING    COMPREHENSIVE METABOLIC PANEL     Status: Abnormal   Collection Time    04/06/13 12:35 PM      Result Value Ref Range   Sodium 119 (*) 137 - 147 mEq/L   Comment: CRITICAL RESULT CALLED TO, READ BACK BY AND VERIFIED WITH:     B. SCALES RN AT 1315 ON 02.09.15 BY SHUEA   Potassium 4.9  3.7 - 5.3 mEq/L   Chloride 86 (*) 96 - 112 mEq/L   CO2 20  19 - 32 mEq/L   Glucose, Bld 152 (*) 70 - 99 mg/dL   BUN 12  6 - 23 mg/dL   Creatinine, Ser 0.58  0.50 - 1.35 mg/dL   Calcium 8.0 (*) 8.4 - 10.5 mg/dL   Total Protein 7.3  6.0 - 8.3 g/dL   Albumin 2.8 (*) 3.5 - 5.2 g/dL   AST 70 (*) 0 - 37 U/L   ALT 45  0 - 53 U/L   Alkaline Phosphatase 173 (*) 39 - 117 U/L   Total Bilirubin 4.6 (*) 0.3 - 1.2 mg/dL   GFR calc non Af Amer >90  >90 mL/min   GFR calc Af Amer >90  >90 mL/min   Comment: (NOTE)     The eGFR has been calculated using the CKD EPI equation.     This calculation has not been validated in all clinical situations.     eGFR's persistently <90 mL/min signify possible Chronic Kidney     Disease.  ETHANOL     Status: None   Collection Time    04/06/13 12:35 PM      Result Value Ref Range   Alcohol, Ethyl (B) <11  0 - 11 mg/dL    Comment:            LOWEST DETECTABLE LIMIT FOR     SERUM ALCOHOL IS 11 mg/dL  FOR MEDICAL PURPOSES ONLY  AMMONIA     Status: Abnormal   Collection Time    04/06/13 12:35 PM      Result Value Ref Range   Ammonia 69 (*) 11 - 60 umol/L  CG4 I-STAT (LACTIC ACID)     Status: Abnormal   Collection Time    04/06/13 12:46 PM      Result Value Ref Range   Lactic Acid, Venous 3.61 (*) 0.5 - 2.2 mmol/L  CBC WITH DIFFERENTIAL     Status: Abnormal   Collection Time    04/06/13  1:40 PM      Result Value Ref Range   WBC 4.9  4.0 - 10.5 K/uL   RBC 3.24 (*) 4.22 - 5.81 MIL/uL   Hemoglobin 11.4 (*) 13.0 - 17.0 g/dL   HCT 31.4 (*) 39.0 - 52.0 %   MCV 96.9  78.0 - 100.0 fL   MCH 35.2 (*) 26.0 - 34.0 pg   MCHC 36.3 (*) 30.0 - 36.0 g/dL   RDW 14.6  11.5 - 15.5 %   Platelets 43 (*) 150 - 400 K/uL   Comment: REPEATED TO VERIFY     SPECIMEN CHECKED FOR CLOTS     PLATELET COUNT CONFIRMED BY SMEAR   Neutrophils Relative % 78 (*) 43 - 77 %   Neutro Abs 3.8  1.7 - 7.7 K/uL   Lymphocytes Relative 12  12 - 46 %   Lymphs Abs 0.6 (*) 0.7 - 4.0 K/uL   Monocytes Relative 8  3 - 12 %   Monocytes Absolute 0.4  0.1 - 1.0 K/uL   Eosinophils Relative 2  0 - 5 %   Eosinophils Absolute 0.1  0.0 - 0.7 K/uL   Basophils Relative 0  0 - 1 %   Basophils Absolute 0.0  0.0 - 0.1 K/uL  PROTIME-INR     Status: Abnormal   Collection Time    04/06/13  1:40 PM      Result Value Ref Range   Prothrombin Time 23.8 (*) 11.6 - 15.2 seconds   INR 2.21 (*) 0.00 - 1.49  URINALYSIS, ROUTINE W REFLEX MICROSCOPIC     Status: None   Collection Time    04/06/13  1:50 PM      Result Value Ref Range   Color, Urine YELLOW  YELLOW   APPearance CLEAR  CLEAR   Specific Gravity, Urine 1.009  1.005 - 1.030   pH 6.0  5.0 - 8.0   Glucose, UA NEGATIVE  NEGATIVE mg/dL   Hgb urine dipstick NEGATIVE  NEGATIVE   Bilirubin Urine NEGATIVE  NEGATIVE   Ketones, ur NEGATIVE  NEGATIVE mg/dL   Protein, ur NEGATIVE  NEGATIVE mg/dL    Urobilinogen, UA 1.0  0.0 - 1.0 mg/dL   Nitrite NEGATIVE  NEGATIVE   Leukocytes, UA NEGATIVE  NEGATIVE   Comment: MICROSCOPIC NOT DONE ON URINES WITH NEGATIVE PROTEIN, BLOOD, LEUKOCYTES, NITRITE, OR GLUCOSE <1000 mg/dL.  URINE RAPID DRUG SCREEN (HOSP PERFORMED)     Status: None   Collection Time    04/06/13  1:50 PM      Result Value Ref Range   Opiates NONE DETECTED  NONE DETECTED   Cocaine NONE DETECTED  NONE DETECTED   Benzodiazepines NONE DETECTED  NONE DETECTED   Amphetamines NONE DETECTED  NONE DETECTED   Tetrahydrocannabinol NONE DETECTED  NONE DETECTED   Barbiturates NONE DETECTED  NONE DETECTED   Comment:  DRUG SCREEN FOR MEDICAL PURPOSES     ONLY.  IF CONFIRMATION IS NEEDED     FOR ANY PURPOSE, NOTIFY LAB     WITHIN 5 DAYS.                LOWEST DETECTABLE LIMITS     FOR URINE DRUG SCREEN     Drug Class       Cutoff (ng/mL)     Amphetamine      1000     Barbiturate      200     Benzodiazepine   224     Tricyclics       497     Opiates          300     Cocaine          300     THC              50  CBC WITH DIFFERENTIAL     Status: Abnormal   Collection Time    04/06/13  4:45 PM      Result Value Ref Range   WBC 4.8  4.0 - 10.5 K/uL   RBC 3.25 (*) 4.22 - 5.81 MIL/uL   Hemoglobin 11.3 (*) 13.0 - 17.0 g/dL   HCT 31.6 (*) 39.0 - 52.0 %   MCV 97.2  78.0 - 100.0 fL   MCH 34.8 (*) 26.0 - 34.0 pg   MCHC 35.8  30.0 - 36.0 g/dL   RDW 14.6  11.5 - 15.5 %   Platelets 43 (*) 150 - 400 K/uL   Comment: REPEATED TO VERIFY     SPECIMEN CHECKED FOR CLOTS     CONSISTENT WITH PREVIOUS RESULT     PLATELET COUNT CONFIRMED BY SMEAR   Neutrophils Relative % 75  43 - 77 %   Neutro Abs 3.6  1.7 - 7.7 K/uL   Lymphocytes Relative 16  12 - 46 %   Lymphs Abs 0.8  0.7 - 4.0 K/uL   Monocytes Relative 7  3 - 12 %   Monocytes Absolute 0.4  0.1 - 1.0 K/uL   Eosinophils Relative 2  0 - 5 %   Eosinophils Absolute 0.1  0.0 - 0.7 K/uL   Basophils Relative 0  0 - 1 %   Basophils  Absolute 0.0  0.0 - 0.1 K/uL  TROPONIN I     Status: None   Collection Time    04/06/13  4:45 PM      Result Value Ref Range   Troponin I <0.30  <0.30 ng/mL   Comment:            Due to the release kinetics of cTnI,     a negative result within the first hours     of the onset of symptoms does not rule out     myocardial infarction with certainty.     If myocardial infarction is still suspected,     repeat the test at appropriate intervals.  TROPONIN I     Status: None   Collection Time    04/06/13 10:40 PM      Result Value Ref Range   Troponin I <0.30  <0.30 ng/mL   Comment:            Due to the release kinetics of cTnI,     a negative result within the first hours     of the onset of symptoms does not rule out  myocardial infarction with certainty.     If myocardial infarction is still suspected,     repeat the test at appropriate intervals.  TROPONIN I     Status: None   Collection Time    04/07/13  4:20 AM      Result Value Ref Range   Troponin I <0.30  <0.30 ng/mL   Comment:            Due to the release kinetics of cTnI,     a negative result within the first hours     of the onset of symptoms does not rule out     myocardial infarction with certainty.     If myocardial infarction is still suspected,     repeat the test at appropriate intervals.  GLUCOSE, CAPILLARY     Status: Abnormal   Collection Time    04/07/13  8:05 AM      Result Value Ref Range   Glucose-Capillary 120 (*) 70 - 99 mg/dL  COMPREHENSIVE METABOLIC PANEL     Status: Abnormal   Collection Time    04/07/13  3:20 PM      Result Value Ref Range   Sodium 130 (*) 137 - 147 mEq/L   Comment: REPEATED TO VERIFY     DELTA CHECK NOTED   Potassium 4.3  3.7 - 5.3 mEq/L   Comment: REPEATED TO VERIFY   Chloride 95 (*) 96 - 112 mEq/L   Comment: REPEATED TO VERIFY     DELTA CHECK NOTED   CO2 25  19 - 32 mEq/L   Glucose, Bld 141 (*) 70 - 99 mg/dL   BUN 13  6 - 23 mg/dL   Creatinine, Ser 0.72  0.50  - 1.35 mg/dL   Calcium 8.4  8.4 - 10.5 mg/dL   Total Protein 6.9  6.0 - 8.3 g/dL   Albumin 2.7 (*) 3.5 - 5.2 g/dL   AST 58 (*) 0 - 37 U/L   ALT 43  0 - 53 U/L   Alkaline Phosphatase 167 (*) 39 - 117 U/L   Total Bilirubin 4.4 (*) 0.3 - 1.2 mg/dL   GFR calc non Af Amer >90  >90 mL/min   GFR calc Af Amer >90  >90 mL/min   Comment: (NOTE)     The eGFR has been calculated using the CKD EPI equation.     This calculation has not been validated in all clinical situations.     eGFR's persistently <90 mL/min signify possible Chronic Kidney     Disease.  GLUCOSE, CAPILLARY     Status: Abnormal   Collection Time    04/07/13 10:10 PM      Result Value Ref Range   Glucose-Capillary 146 (*) 70 - 99 mg/dL   Comment 1 Documented in Chart     Comment 2 Notify RN    PROTIME-INR     Status: Abnormal   Collection Time    04/08/13  4:00 AM      Result Value Ref Range   Prothrombin Time 23.4 (*) 11.6 - 15.2 seconds   INR 2.16 (*) 0.00 - 1.49  CBC     Status: Abnormal   Collection Time    04/08/13  4:00 AM      Result Value Ref Range   WBC 5.4  4.0 - 10.5 K/uL   RBC 3.28 (*) 4.22 - 5.81 MIL/uL   Hemoglobin 11.4 (*) 13.0 - 17.0 g/dL   HCT 33.5 (*) 39.0 - 52.0 %  MCV 102.1 (*) 78.0 - 100.0 fL   MCH 34.8 (*) 26.0 - 34.0 pg   MCHC 34.0  30.0 - 36.0 g/dL   RDW 15.2  11.5 - 15.5 %   Platelets 40 (*) 150 - 400 K/uL   Comment: CONSISTENT WITH PREVIOUS RESULT  COMPREHENSIVE METABOLIC PANEL     Status: Abnormal   Collection Time    04/08/13  4:00 AM      Result Value Ref Range   Sodium 127 (*) 137 - 147 mEq/L   Potassium 4.1  3.7 - 5.3 mEq/L   Chloride 94 (*) 96 - 112 mEq/L   CO2 22  19 - 32 mEq/L   Glucose, Bld 116 (*) 70 - 99 mg/dL   BUN 14  6 - 23 mg/dL   Creatinine, Ser 0.73  0.50 - 1.35 mg/dL   Calcium 8.0 (*) 8.4 - 10.5 mg/dL   Total Protein 6.9  6.0 - 8.3 g/dL   Albumin 2.8 (*) 3.5 - 5.2 g/dL   AST 58 (*) 0 - 37 U/L   ALT 43  0 - 53 U/L   Alkaline Phosphatase 176 (*) 39 - 117 U/L    Total Bilirubin 2.9 (*) 0.3 - 1.2 mg/dL   GFR calc non Af Amer >90  >90 mL/min   GFR calc Af Amer >90  >90 mL/min   Comment: (NOTE)     The eGFR has been calculated using the CKD EPI equation.     This calculation has not been validated in all clinical situations.     eGFR's persistently <90 mL/min signify possible Chronic Kidney     Disease.  GLUCOSE, CAPILLARY     Status: Abnormal   Collection Time    04/08/13  7:50 AM      Result Value Ref Range   Glucose-Capillary 124 (*) 70 - 99 mg/dL   Labs are reviewed and are pertinent for high liver enzymes.  Current Facility-Administered Medications  Medication Dose Route Frequency Provider Last Rate Last Dose  . ferrous sulfate tablet 325 mg  325 mg Oral QHS Nita Sells, MD   325 mg at 04/07/13 2300  . folic acid (FOLVITE) tablet 1 mg  1 mg Oral q morning - 10a Nita Sells, MD   1 mg at 04/07/13 1059  . furosemide (LASIX) tablet 40 mg  40 mg Oral BID Nita Sells, MD   40 mg at 04/08/13 0800  . insulin glargine (LANTUS) injection 12 Units  12 Units Subcutaneous QHS Nita Sells, MD   12 Units at 04/07/13 2300  . lactulose (CHRONULAC) 10 GM/15ML solution 45 g  45 g Oral TID Nita Sells, MD   45 g at 04/07/13 1646  . levETIRAcetam (KEPPRA) tablet 750 mg  750 mg Oral BID Nita Sells, MD   750 mg at 04/07/13 2300  . levothyroxine (SYNTHROID, LEVOTHROID) tablet 75 mcg  75 mcg Oral QAC breakfast Nita Sells, MD   75 mcg at 04/08/13 0800  . nadolol (CORGARD) tablet 40 mg  40 mg Oral q morning - 10a Nita Sells, MD   40 mg at 04/07/13 1059  . oxyCODONE (Oxy IR/ROXICODONE) immediate release tablet 5 mg  5 mg Oral Q6H PRN Nita Sells, MD   5 mg at 04/08/13 0800  . pantoprazole (PROTONIX) EC tablet 40 mg  40 mg Oral Daily Nita Sells, MD   40 mg at 04/07/13 1059  . rifaximin (XIFAXAN) tablet 550 mg  550 mg Oral BID Nita Sells, MD  550 mg at 04/07/13 2300  . sodium  chloride 0.9 % injection 3 mL  3 mL Intravenous Q12H Nita Sells, MD   3 mL at 04/07/13 2300  . spironolactone (ALDACTONE) tablet 200 mg  200 mg Oral q morning - 10a Nita Sells, MD   200 mg at 04/07/13 1059    Psychiatric Specialty Exam:     Blood pressure 104/52, pulse 53, temperature 97.9 F (36.6 C), temperature source Oral, resp. rate 16, height 5' 8"  (1.727 m), weight 212 lb 8.4 oz (96.4 kg), SpO2 98.00%.Body mass index is 32.32 kg/(m^2).  General Appearance: Casual and Disheveled  Eye Contact::  Fair  Speech:  Clear and Coherent and Slow  Volume:  Normal  Mood:  Euthymic  Affect:  Appropriate and Constricted  Thought Process:  Goal Directed  Orientation:  Full (Time, Place, and Person)  Thought Content:  WDL and Rumination  Suicidal Thoughts:  No  Homicidal Thoughts:  No  Memory:  Immediate;   Fair Recent;   Fair Remote;   Poor  Judgement:  Fair  Insight:  Fair  Psychomotor Activity:  Normal  Concentration:  Fair  Recall:  [employer] [place]  Language: Fair  Akathisia:  No  Handed:  Right  AIMS (if indicated):     Assets:  Communication Skills Desire for Improvement Housing Social Support  Sleep:      Musculoskeletal: Strength & Muscle Tone: Mild tremors Gait & Station: Patient lying on the bed Patient leans: N/A  Treatment Plan Summary: Medication management Patient has capacity to participate in his treatment plan.  He wants to live by himself for the support of his daughter.  Patient does not require inpatient treatment however information can be given for long-term rehabilitation program from social worker if patient requested.  Please call [phone] if there any further questions.  Kyl Givler T. 04/08/2013 9:53 AM

## 2013-04-08 NOTE — Progress Notes (Signed)
Clinical Social Work Department CLINICAL SOCIAL WORK PSYCHIATRY SERVICE LINE ASSESSMENT 04/08/2013  Patient:  Tony Boyd  Account:  [id number]  [place] Date:  04/06/2013  Clinical Social Worker:  Sindy Messing, LCSW  Date/Time:  04/08/2013 11:45 AM Referred by:  Physician  Date referred:  04/08/2013 Reason for Referral  Psychosocial assessment   Presenting Symptoms/Problems (In the person's/family's own words):   Psych consulted due determine capacity.   Abuse/Neglect/Trauma History (check all that apply)  Witness to trauma   Abuse/Neglect/Trauma Comments:   Patient reports PTSD from serving in the war. Patient reports he takes an antidepressant to assist with PTSD.   Psychiatric History (check all that apply)  Outpatient treatment   Psychiatric medications:  Xanax 0.5 mg  Risperdal 0.25 mg   Current Mental Health Hospitalizations/Previous Mental Health History:   Patient reports his PCP prescribes his medications for PTSD. Patient reports no therapy and no psychiatrist in the community. Patient is not interested in any outpatient follow up.   Current provider:   PCP   Place and Date:   [place], [place]   Current Medications:   Scheduled Meds:      . [START ON 04/09/2013] feeding supplement (GLUCERNA SHAKE)  237 mL Oral Q24H  . ferrous sulfate  325 mg Oral QHS  . folic acid  1 mg Oral q morning - 10a  . furosemide  40 mg Oral BID  . insulin glargine  12 Units Subcutaneous QHS  . lactulose  45 g Oral TID  . levETIRAcetam  750 mg Oral BID  . levothyroxine  75 mcg Oral QAC breakfast  . nadolol  40 mg Oral q morning - 10a  . pantoprazole  40 mg Oral Daily  . rifaximin  550 mg Oral BID  . sodium chloride  3 mL Intravenous Q12H  . spironolactone  200 mg Oral q morning - 10a        Continuous Infusions:      PRN Meds:.oxyCODONE       Previous Impatient Admission/Date/Reason:   None reported   Emotional Health / Current Symptoms    Suicide/Self Harm  None reported    Suicide attempt in the past:   Patient denies any previous attempts. Patient denies any SI or HI.   Other harmful behavior:   None reported   Psychotic/Dissociative Symptoms  Other - See comment   Other Psychotic/Dissociative Symptoms:   Patient reports he experiences flashbacks of the war whenever he is drinking alcohol.    Attention/Behavioral Symptoms  Within Normal Limits   Other Attention / Behavioral Symptoms:   Patient alert and oriented throughout the assessment.    Cognitive Impairment  Within Normal Limits   Other Cognitive Impairment:   Patient alert and oriented. Patient scored 28/30 on mini-mental status exam.    Mood and Adjustment  Flat    Stress, Anxiety, Trauma, Any Recent Loss/Stressor  Flashbacks (Intrusive recollections of past traumatic events)   Anxiety (frequency):   N/A   Phobia (specify):   N/A   Compulsive behavior (specify):   N/A   Obsessive behavior (specify):   N/A   Other:   Patient experiences flashbacks from war.   Substance Abuse/Use  Current substance use   SBIRT completed (please refer for detailed history):  Y  Self-reported substance use:   Patient reports he drinks 2-3 days out of the week. Patient reports he drinks 2-3 12 oz. beers when he drinks. Patient completed SBIRT and denies that SA is related to emotional connections. Patient  reports he is not interested in any SA treatment.   Urinary Drug Screen Completed:   Alcohol level:   <11    Environmental/Housing/Living Arrangement  Stable housing   Who is in the home:   Alone   Emergency contact:  Tony Boyd   Financial  Medicare  Medicaid   Patient's Strengths and Goals (patient's own words):   Patient reports that dtr Tony Boyd) is very helpful and assists him as needed.   Clinical Social Worker's Interpretive Summary:   CSW received referral to complete psychosocial assessment. CSW reviewed chart and met with patient at bedside. CSW introduced  myself and explained role.    Patient reports he was recently DC from the hospital. Patient reports that he went to SNF but did not like facility so he left. Patient was evicted from his apartments off of [address] because his dtr Tony Boyd is POA and payee) did not pay his rent. Patient has been unable to get in touch with Tony Boyd for about 3-4 weeks and has been unable to get his money. Patient reports that Tony Boyd is involved and helped him find a new apartment and is assisting with Boyd. Patient reports that he is considering switching payee from [place] to [place].    Patient reports that wife passed away 6 years ago unexpectedly. Patient's wife had diabetes and laid down one night and passed away during the night. Patient reports that it was difficult to deal with death but never received any treatment. Patient reports that dtr struggled with death and Tony Boyd has had a hard time because her husband passed away from a heart attack shortly after. Patient reports that he feels Tony Boyd is disconnected due to deaths. Patient reports he has PTSD but denies any depression from deaths. Patient reports medication is effective but denies any further treatment. Patient denies that SA is related to emotional connections.    Patient reports he has drank alcohol for several years and understands that the MDs do not want him to drink but that he enjoys consuming alcohol. Patient reports his alcohol consumption has not increased since his wife's death and reports that he does not need any treatment.    Psych MD reports that patient has capacity and patient scored 28/30 on MMSE. Patient is not receptive to any outpatient follow up and reports no CSW needs at this time.    At the conclusion of assessment, patient had a seizure. CSW called RN and will follow up with patient at later time.   Disposition:  Recommend Psych CSW continuing to support while in hospital  [place], [place] [phone]

## 2013-04-09 ENCOUNTER — Inpatient Hospital Stay (HOSPITAL_COMMUNITY): Payer: Medicare Other

## 2013-04-09 ENCOUNTER — Inpatient Hospital Stay (HOSPITAL_COMMUNITY)
Admit: 2013-04-09 | Discharge: 2013-04-09 | Disposition: A | Discharge status: Home / Self Care | Payer: Medicare Other | Attending: Internal Medicine | Admitting: Internal Medicine

## 2013-04-09 DIAGNOSIS — E440 Moderate protein-calorie malnutrition: Secondary | ICD-10-CM | POA: Insufficient documentation

## 2013-04-09 LAB — BASIC METABOLIC PANEL
BUN: 14 mg/dL (ref 6–23)
CHLORIDE: 91 meq/L — AB (ref 96–112)
CO2: 25 mEq/L (ref 19–32)
CREATININE: 0.75 mg/dL (ref 0.50–1.35)
Calcium: 8.6 mg/dL (ref 8.4–10.5)
GFR calc Af Amer: >90 mL/min (ref >90)
GFR calc non Af Amer: >90 mL/min (ref >90)
GLUCOSE: 210 mg/dL — AB (ref 70–99)
POTASSIUM: 4.6 meq/L (ref 3.7–5.3)
Sodium: 127 mEq/L — ABNORMAL LOW (ref 137–147)

## 2013-04-09 LAB — GLUCOSE, CAPILLARY: Glucose-Capillary: 107 mg/dL — ABNORMAL HIGH (ref 70–99)

## 2013-04-09 MED ORDER — CYCLOBENZAPRINE HCL 10 MG PO TABS
10.0000 mg | ORAL_TABLET | Freq: Three times a day (TID) | ORAL | Status: DC | PRN
  Administered 2013-04-10 – 2013-04-11 (×5): 10 mg via ORAL
  Filled 2013-04-09 (×6): qty 1

## 2013-04-09 MED ORDER — LEVETIRACETAM 500 MG PO TABS
1000.0000 mg | ORAL_TABLET | Freq: Two times a day (BID) | ORAL | Status: DC
  Administered 2013-04-09 – 2013-04-11 (×4): 1000 mg via ORAL
  Filled 2013-04-09 (×5): qty 2

## 2013-04-09 MED ORDER — OXYCODONE HCL 5 MG PO TABS
5.0000 mg | ORAL_TABLET | Freq: Once | ORAL | Status: AC
  Administered 2013-04-09: 5 mg via ORAL
  Filled 2013-04-09: qty 1

## 2013-04-09 MED ORDER — LORAZEPAM 1 MG PO TABS
1.0000 mg | ORAL_TABLET | Freq: Once | ORAL | Status: DC
  Filled 2013-04-09: qty 1

## 2013-04-09 NOTE — Progress Notes (Signed)
OT Cancellation Note  Patient Details Name: Tony Boyd MRN: [id number] DOB: May 29, 1955   Cancelled Treatment:    Reason Eval/Treat Not Completed: Other (comment).  Spoke to [employer].  Pt had seizure yesterday.  Has been confused today and EEG is pending any time.  Will reattempt tomorrow.  Duc Crocket 04/09/2013, 2:26 PM Lesle Chris, OTR/L [phone] 04/09/2013

## 2013-04-09 NOTE — Progress Notes (Signed)
EEG completed; results pending.    

## 2013-04-09 NOTE — Progress Notes (Signed)
Inpatient Diabetes Program Recommendations  AACE/ADA: New Consensus Statement on Inpatient Glycemic Control (2013)  Target Ranges:  Prepandial:   less than 140 mg/dL      Peak postprandial:   less than 180 mg/dL (1-2 hours)      Critically ill patients:  140 - 180 mg/dL   Hyperglycemia  Inpatient Diabetes Program Recommendations Correction (SSI): Please add moderate correction tidwc and HS scale. Noted pt to have recurrent thrombocytopenia with low Hgb/Hct levels-This would potentially modify his true HgbA1C. Thank you, Rosita Kea, RN, CNS, Diabetes Coordinator [phone])

## 2013-04-09 NOTE — Progress Notes (Signed)
Pt states feel shaky and nervous, NP notified--order received for once time dose of Ativan 1mg . SRP, RN

## 2013-04-09 NOTE — Progress Notes (Addendum)
PROGRESS NOTE   Tony Boyd NWG:[id number] DOB: 12/28/1955 DOA: 04/06/2013 PCP: Hollace Kinnier, DO  Assessment/Plan: Likely seizure from not taking Keppra-see below. Increased Keppra dose from 500-750 twice a day. Possible seizure like episode 2/11 however without post ictal features.  Toxic metabolic encephalopathy-probably related to subacute alcohol intoxication. Could also be secondary to seizure from not being compliant on his Keppra. - EEG pending - a bit more confused today, without asterixis. Recheck ammonia given more confusion. On lactulose/rifaximin. No abdominal distention to suggest ascites but if confusion persists will likely need abdominal US> possible diagnostic paracentesis Hepatic encephalopathy- intermittent Cirrhosis + end-stage liver disease -.  Continue Lasix 40 mg 3 times a day, Aldactone 200 every morning, complete metabolic panel in a.m.  - may need palliative at this point.  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 Nutrition input  Chronic back pain-careful implementation of oxycodone  Hyponatremia - fluid restrict  Diet: heart Fluids: none DVT Prophylaxis: SCD  Code Status: Full Family Communication: none  Disposition Plan: inpatient  Consultants:  none  Procedures:  none   Antibiotics  Anti-infectives   Start     Dose/Rate Route Frequency Ordered Stop   04/06/13 2200  rifaximin (XIFAXAN) tablet 550 mg     550 mg Oral 2 times daily 04/06/13 1616       Antibiotics Given (last 72 hours)   Date/Time Action Medication Dose   04/06/13 2216 Given   rifaximin (XIFAXAN) tablet 550 mg 550 mg   04/07/13 1059 Given   rifaximin (XIFAXAN) tablet 550 mg 550 mg   04/07/13 2300 Given   rifaximin (XIFAXAN) tablet 550 mg 550 mg   04/08/13 1024  Given   rifaximin (XIFAXAN) tablet 550 mg 550 mg   04/08/13 2342 Given   rifaximin (XIFAXAN) tablet 550 mg 550 mg   04/09/13 1049 Given   rifaximin (XIFAXAN) tablet 550 mg 550 mg      HPI/Subjective: - somewhat confused, AxOx4 but does not recall lunch  Objective: Filed Vitals:   04/08/13 1424 04/08/13 2152 04/09/13 0512 04/09/13 1259  BP: 122/64 106/57 115/59 117/66  Pulse: 57 62 53 53  Temp: 98 F (36.7 C) 98.6 F (37 C) 98.1 F (36.7 C) 98.5 F (36.9 C)  TempSrc: Oral Oral Oral Oral  Resp: 18 18 20 18   Height:      Weight:   97.6 kg (215 lb 2.7 oz)   SpO2: 100% 97% 99% 100%    Intake/Output Summary (Last 24 hours) at 04/09/13 1816 Last data filed at 04/09/13 1700  Gross per 24 hour  Intake    960 ml  Output    850 ml  Net    110 ml   Filed Weights   04/07/13 0500 04/08/13 0411 04/09/13 0512  Weight: 97.5 kg (214 lb 15.2 oz) 96.4 kg (212 lb 8.4 oz) 97.6 kg (215 lb 2.7 oz)   Exam:  General:  NAD, ill appearing, mild scleral icterus  Cardiovascular: regular rate and rhythm, without MRG  Respiratory: good air movement, clear to auscultation throughout, no wheezing, ronchi or rales  Abdomen: soft, not tender to palpation, positive bowel sounds  MSK: no peripheral edema  Neuro: CN 2-12 grossly intact, MS 5/5 in all 4, no asterixis  Data Reviewed: Basic Metabolic Panel:  Recent Labs Lab 04/06/13 1235 04/07/13 1520 04/08/13 0400 04/09/13 1237  NA  119* 130* 127* 127*  K 4.9 4.3 4.1 4.6  CL 86* 95* 94* 91*  CO2 20 25 22 25   GLUCOSE 152* 141* 116* 210*  BUN 12 13 14 14   CREATININE 0.58 0.72 0.73 0.75  CALCIUM 8.0* 8.4 8.0* 8.6   Liver Function Tests:  Recent Labs Lab 04/06/13 1235 04/07/13 1520 04/08/13 0400  AST 70* 58* 58*  ALT 45 43 43  ALKPHOS 173* 167* 176*  BILITOT 4.6* 4.4* 2.9*  PROT 7.3 6.9 6.9  ALBUMIN 2.8* 2.7* 2.8*    Recent Labs Lab 04/06/13 1235  AMMONIA 69*   CBC:  Recent Labs Lab 04/06/13 1235 04/06/13 1340  04/06/13 1645 04/08/13 0400  WBC SPECIMEN CLOTTED NOTIFIED LISA IN ED 2.9.15 AT 1330 BY W.SHEA,MT. 4.9 4.8 5.4  NEUTROABS PENDING 3.8 3.6  --   HGB SPECIMEN CLOTTED NOTIFIED LISA IN ED 2.9.15 AT 1330 BY W.SHEA,MT. 11.4* 11.3* 11.4*  HCT SPECIMEN CLOTTED NOTIFIED LISA IN ED 2.9.15 AT 1330 BY W.SHEA,MT. 31.4* 31.6* 33.5*  MCV SPECIMEN CLOTTED NOTIFIED LISA IN ED 2.9.15 AT 1330 BY W.SHEA,MT. 96.9 97.2 102.1*  PLT SPECIMEN CLOTTED NOTIFIED LISA IN ED 2.9.15 AT 1330 BY W.SHEA,MT. 43* 43* 40*   Cardiac Enzymes:  Recent Labs Lab 04/06/13 1645 04/06/13 2240 04/07/13 0420  TROPONINI <0.30 <0.30 <0.30   BNP (last 3 results)  Recent Labs  12/03/12 1435 01/11/13 2055 02/18/13 0234  PROBNP 297.7* 207.4* 985.8*   CBG:  Recent Labs Lab 04/07/13 0805 04/07/13 2210 04/08/13 0750 04/09/13 0734  GLUCAP 120* 146* 124* 107*   Studies: No results found.  Scheduled Meds: . feeding supplement (GLUCERNA SHAKE)  237 mL Oral Q24H  . ferrous sulfate  325 mg Oral QHS  . folic acid  1 mg Oral q morning - 10a  . furosemide  40 mg Oral BID  . insulin glargine  12 Units Subcutaneous QHS  . lactulose  45 g Oral TID  . levETIRAcetam  750 mg Oral BID  . levothyroxine  75 mcg Oral QAC breakfast  . LORazepam  1 mg Oral Once  . nadolol  40 mg Oral q morning - 10a  . pantoprazole  40 mg Oral Daily  . rifaximin  550 mg Oral BID  . sodium chloride  3 mL Intravenous Q12H  . 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 If 7 PM - 7 AM, please contact  night-coverage at [email or web address], password [facility] 04/09/2013, 6:16 PM  LOS: 3 days

## 2013-04-09 NOTE — Progress Notes (Signed)
Clinical Social Work  CSW attempted to meet with patient but patient currently completing procedure in room. CSW will follow up at later time.  [place], [place] [phone]

## 2013-04-09 NOTE — Progress Notes (Signed)
PT Cancellation Note  Patient Details Name: Tony Boyd MRN: [id number] DOB: 12-21-1955   Cancelled Treatment:    Reason Eval/Treat Not Completed: Medical issues which prohibited therapy, pt is confused and having an EEG.   Claretha Cooper 04/09/2013, 4:18 PM Tresa Endo PT [phone]

## 2013-04-09 NOTE — Progress Notes (Signed)
Witnessed patient seizing about 6pm, 5-10seconds. Patient did not remember the event afterwards. Vitals remained stable and MD notified.  Applied oxygen 1L for comfort and sats- 97%. Reported activity at shift change to oncoming nurse.

## 2013-04-10 DIAGNOSIS — G40909 Epilepsy, unspecified, not intractable, without status epilepticus: Secondary | ICD-10-CM

## 2013-04-10 LAB — CBC
HEMATOCRIT: 32.9 % — AB (ref 39.0–52.0)
Hemoglobin: 11.3 g/dL — ABNORMAL LOW (ref 13.0–17.0)
MCH: 34.8 pg — ABNORMAL HIGH (ref 26.0–34.0)
MCHC: 34.3 g/dL (ref 30.0–36.0)
MCV: 101.2 fL — AB (ref 78.0–100.0)
PLATELETS: 43 10*3/uL — AB (ref 150–400)
RBC: 3.25 MIL/uL — AB (ref 4.22–5.81)
RDW: 15.2 % (ref 11.5–15.5)
WBC: 5.8 10*3/uL (ref 4.0–10.5)

## 2013-04-10 LAB — PHOSPHORUS: Phosphorus: 4.2 mg/dL (ref 2.3–4.6)

## 2013-04-10 LAB — AMMONIA: AMMONIA: 46 umol/L (ref 11–60)

## 2013-04-10 LAB — COMPREHENSIVE METABOLIC PANEL
ALT: 43 U/L (ref 0–53)
AST: 66 U/L — AB (ref 0–37)
Albumin: 2.7 g/dL — ABNORMAL LOW (ref 3.5–5.2)
Alkaline Phosphatase: 165 U/L — ABNORMAL HIGH (ref 39–117)
BILIRUBIN TOTAL: 3.1 mg/dL — AB (ref 0.3–1.2)
BUN: 15 mg/dL (ref 6–23)
CHLORIDE: 94 meq/L — AB (ref 96–112)
CO2: 23 meq/L (ref 19–32)
CREATININE: 0.81 mg/dL (ref 0.50–1.35)
Calcium: 8.4 mg/dL (ref 8.4–10.5)
GFR calc non Af Amer: >90 mL/min (ref >90)
GLUCOSE: 130 mg/dL — AB (ref 70–99)
Potassium: 4.1 mEq/L (ref 3.7–5.3)
Sodium: 128 mEq/L — ABNORMAL LOW (ref 137–147)
Total Protein: 6.7 g/dL (ref 6.0–8.3)

## 2013-04-10 LAB — GLUCOSE, CAPILLARY
GLUCOSE-CAPILLARY: 142 mg/dL — AB (ref 70–99)
Glucose-Capillary: 102 mg/dL — ABNORMAL HIGH (ref 70–99)

## 2013-04-10 LAB — PROTIME-INR
INR: 2.09 — ABNORMAL HIGH (ref 0.00–1.49)
Prothrombin Time: 22.8 seconds — ABNORMAL HIGH (ref 11.6–15.2)

## 2013-04-10 LAB — MAGNESIUM: Magnesium: 1.7 mg/dL (ref 1.5–2.5)

## 2013-04-10 NOTE — Progress Notes (Signed)
PROGRESS NOTE  Tony Boyd [id number] DOB: [birth year] DOA: 04/06/2013 PCP: Hollace Kinnier, DO  Assessment/Plan: Likely seizure from not taking Keppra-see below. Increased Keppra dose from 500-750 twice a day. Possible seizure like episode 2/11 however without post ictal features.  - recurrent witnessed seizure last night - MRI negative, increased Keppra to 1000 BID. Neurology consulted today, appreciate input Toxic metabolic encephalopathy-probably related to subacute alcohol intoxication. Could also be secondary to seizure from not being compliant on his Keppra. - EEG pending - seems at baseline today Hepatic encephalopathy- intermittent Cirrhosis + end-stage liver disease -.  Continue Lasix 40 mg 3 times a day, Aldactone 200 every morning, complete metabolic panel in a.m.   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 Nutrition input  Chronic back pain-careful implementation of oxycodone  Hyponatremia - fluid restrict  Diet: heart Fluids: none DVT Prophylaxis: SCD  Code Status: Full Family Communication: none  Disposition Plan: inpatient  Consultants:  none  Procedures:  none   Antibiotics  Anti-infectives   Start     Dose/Rate Route Frequency Ordered Stop   04/06/13 2200  rifaximin (XIFAXAN) tablet 550 mg     550 mg Oral 2 times daily 04/06/13 1616       Antibiotics Given (last 72 hours)   Date/Time Action Medication Dose   04/07/13 1059 Given   rifaximin (XIFAXAN) tablet 550 mg 550 mg   04/07/13 2300 Given   rifaximin (XIFAXAN) tablet 550 mg 550 mg   04/08/13 1024 Given   rifaximin (XIFAXAN) tablet 550 mg 550 mg   04/08/13 2342 Given   rifaximin (XIFAXAN) tablet 550 mg 550 mg   04/09/13 1049 Given   rifaximin (XIFAXAN) tablet 550 mg 550 mg   04/09/13  2251 Given   rifaximin (XIFAXAN) tablet 550 mg 550 mg      HPI/Subjective: - AxOx4  Objective: Filed Vitals:   04/09/13 1826 04/09/13 2030 04/09/13 2134 04/10/13 0651  BP: 135/67 120/60 120/61 100/56  Pulse: 59 80 51 57  Temp:  98.3 F (36.8 C) 98.3 F (36.8 C) 98.4 F (36.9 C)  TempSrc:  Oral Oral Oral  Resp: 20 24 20 20   Height:      Weight:    96.299 kg (212 lb 4.8 oz)  SpO2: 100%  98% 99%    Intake/Output Summary (Last 24 hours) at 04/10/13 0901 Last data filed at 04/10/13 0839  Gross per 24 hour  Intake    480 ml  Output    800 ml  Net   -320 ml   Filed Weights   04/08/13 0411 04/09/13 0512 04/10/13 0651  Weight: 96.4 kg (212 lb 8.4 oz) 97.6 kg (215 lb 2.7 oz) 96.299 kg (212 lb 4.8 oz)   Exam:  General:  NAD, ill appearing, mild scleral icterus  Cardiovascular: regular rate and rhythm, without MRG  Respiratory: good air movement, clear to auscultation throughout, no wheezing, ronchi or rales  Abdomen: soft, not tender to palpation, positive bowel sounds  MSK: no peripheral edema  Neuro: CN 2-12 grossly intact, MS 5/5 in all 4, no asterixis  Data Reviewed: Basic Metabolic Panel:  Recent Labs Lab 04/06/13 1235 04/07/13 1520 04/08/13 0400 04/09/13 1237 04/10/13 0422  NA 119* 130* 127* 127* 128*  K 4.9 4.3 4.1 4.6 4.1  CL 86* 95* 94* 91* 94*  CO2 20 25 22 25  23  GLUCOSE 152* 141* 116* 210* 130*  BUN 12 13 14 14 15   CREATININE 0.58 0.72 0.73 0.75 0.81  CALCIUM 8.0* 8.4 8.0* 8.6 8.4  MG  --   --   --   --  1.7  PHOS  --   --   --   --  4.2   Liver Function Tests:  Recent Labs Lab 04/06/13 1235 04/07/13 1520 04/08/13 0400 04/10/13 0422  AST 70* 58* 58* 66*  ALT 45 43 43 43  ALKPHOS 173* 167* 176* 165*  BILITOT 4.6* 4.4* 2.9* 3.1*  PROT 7.3 6.9 6.9 6.7  ALBUMIN 2.8* 2.7* 2.8* 2.7*    Recent Labs Lab 04/06/13 1235 04/09/13 2043  AMMONIA 69* RESULTS UNAVAILABLE DUE TO INTERFERING SUBSTANCE   CBC:  Recent Labs Lab 04/06/13 1235  04/06/13 1340 04/06/13 1645 04/08/13 0400 04/10/13 0422  WBC SPECIMEN CLOTTED NOTIFIED LISA IN ED 2.9.15 AT 1330 BY W.SHEA,MT. 4.9 4.8 5.4 5.8  NEUTROABS PENDING 3.8 3.6  --   --   HGB SPECIMEN CLOTTED NOTIFIED LISA IN ED 2.9.15 AT 1330 BY W.SHEA,MT. 11.4* 11.3* 11.4* 11.3*  HCT SPECIMEN CLOTTED NOTIFIED LISA IN ED 2.9.15 AT 1330 BY W.SHEA,MT. 31.4* 31.6* 33.5* 32.9*  MCV SPECIMEN CLOTTED NOTIFIED LISA IN ED 2.9.15 AT 1330 BY W.SHEA,MT. 96.9 97.2 102.1* 101.2*  PLT SPECIMEN CLOTTED NOTIFIED LISA IN ED 2.9.15 AT 1330 BY W.SHEA,MT. 43* 43* 40* 43*   Cardiac Enzymes:  Recent Labs Lab 04/06/13 1645 04/06/13 2240 04/07/13 0420  TROPONINI <0.30 <0.30 <0.30   BNP (last 3 results)  Recent Labs  12/03/12 1435 01/11/13 2055 02/18/13 0234  PROBNP 297.7* 207.4* 985.8*   CBG:  Recent Labs Lab 04/07/13 0805 04/07/13 2210 04/08/13 0750 04/09/13 0734  GLUCAP 120* 146* 124* 107*   Studies: Dg Lumbar Spine Complete  04/10/2013   CLINICAL DATA:  Fall.  Back pain.  EXAM: LUMBAR SPINE - COMPLETE 4+ VIEW  COMPARISON:  CT ABD/PELVIS W CM dated 02/18/2013  FINDINGS: Degenerative grade I anterolisthesis of L4 on L5, associated with severe facet arthrosis. L4-L5 vacuum disc. Chronic L3 superior endplate compression fracture without interval change from recent CT. No acute osseous abnormality. Lumbosacral transitional anatomy. Rudimentary T12 ribs.  IMPRESSION: Chronic changes of the lumbar spine.  No acute injury.   Electronically Signed   By: Dereck Ligas M.D.   On: 04/10/2013 00:25   Mr Brain Wo Contrast  04/10/2013   CLINICAL DATA:  Seizure  EXAM: MRI HEAD WITHOUT CONTRAST  TECHNIQUE: Multiplanar, multiecho pulse sequences of the brain and surrounding structures were obtained without intravenous contrast.  COMPARISON:  Prior CT from 03/23/2013  FINDINGS: Diffuse prominence of the CSF containing spaces is compatible with mild cerebral atrophy. Confluent T2/FLAIR hyperintensity within the  periventricular white matter is most consistent with mild chronic microvascular ischemic changes. Mildly increased T1 signal intensity seen within the basal ganglia bilaterally, which may be related to underlying liver disease. No other focal parenchymal signal abnormality is identified. No mass lesion, midline shift, or extra-axial fluid collection. Ventricles are normal in size without evidence of hydrocephalus. Hippocampi are symmetric bilaterally without signal abnormality.  No diffusion-weighted signal abnormality is identified to suggest acute intracranial infarct. Gray-white matter differentiation is maintained. Normal flow voids are seen within the intracranial vasculature. No intracranial hemorrhage identified.  The cervicomedullary junction is normal. Pituitary gland is within normal limits. Pituitary stalk is midline. The globes and optic nerves demonstrate a normal appearance with normal signal intensity. The  The bone marrow  signal intensity is normal. Calvarium is intact. Visualized upper cervical spine is within normal limits.  Scalp soft tissues are unremarkable.  Small amount of fluid density noted within the inferior aspect of the maxillary sinuses bilaterally. Paranasal sinuses are otherwise clear. Small bilateral amount of fluid density noted within the mastoid air cells bilaterally.  IMPRESSION: 1. No acute intracranial infarct or other abnormality identified. 2. Mildly increased T1 signal intensity within the basal ganglia bilaterally, which may be related to underlying liver disease. 3. Mild atrophy with chronic microvascular ischemic disease.   Electronically Signed   By: Jeannine Boga M.D.   On: 04/10/2013 04:13    Scheduled Meds: . feeding supplement (GLUCERNA SHAKE)  237 mL Oral Q24H  . ferrous sulfate  325 mg Oral QHS  . folic acid  1 mg Oral q morning - 10a  . furosemide  40 mg Oral BID  . insulin glargine  12 Units Subcutaneous QHS  . lactulose  45 g Oral TID  .  levETIRAcetam  1,000 mg Oral BID  . levothyroxine  75 mcg Oral QAC breakfast  . LORazepam  1 mg Oral Once  . nadolol  40 mg Oral q morning - 10a  . pantoprazole  40 mg Oral Daily  . rifaximin  550 mg Oral BID  . sodium chloride  3 mL Intravenous Q12H  . 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 If 7 PM - 7 AM, please contact night-coverage at [email or web address], password [facility] 04/10/2013, 9:01 AM  LOS: 4 days

## 2013-04-10 NOTE — Progress Notes (Signed)
Pt refused to have IV started. Will cont to enc.patient to get IV restarted. SRP, RN

## 2013-04-10 NOTE — Progress Notes (Signed)
Patient is resting. Patient just spoke to family and is awaiting a visit today. Patients skin is dry and intact. Pain is still rated at an 8. Patient has ice water on bedside table and call bell within reach. Bed is in low position. Lacretia Leigh, SN, RCC

## 2013-04-10 NOTE — Progress Notes (Signed)
Patient is resting however is not comfortable. Patient reports pain 8 out of 10, but is up now walking with physical therapy. Lacretia Leigh, SN, RCC

## 2013-04-10 NOTE — Progress Notes (Signed)
OT Cancellation Note  Patient Details Name: Tony Boyd MRN: [id number] DOB: [birth year]   Cancelled Treatment:    Reason Eval/Treat Not Completed: Other (comment) Pt states he is awaiting discharge.  He was mod I with PT earlier today and is likely at or above goal level for OT.  It pt remains, will check back Mon. Tony Boyd 04/10/2013, 4:19 PM Lesle Chris, OTR/L [phone] 04/10/2013

## 2013-04-10 NOTE — Progress Notes (Signed)
Pt returned from MRI @ 2030- Writer went to get eletrodes for the patient telemetry monitor returned pt in bathroom, and returning to bed informed lab tech he had slipped to the floor attempting the put his pajama bottom on. Pt was able to ambulate to the bed without issues. Pajama bottom was on patient. Patient has chronic back pain and also is forgetful at times. Vital signs stable. MD notified, lumbar back xray completed Flexeril and Oxycodone IR given as ordered. Will continue to monitor. Pt constantly complains of chronic back discomfort, states he has old injury from serving in the [employer] and chronic pain from ADLs. SRP, RN

## 2013-04-10 NOTE — Progress Notes (Signed)
Physical Therapy Treatment Patient Details Name: Tony Boyd MRN: [id number] DOB: 1955/10/25 Today's Date: 04/10/2013 Time: 6195-0932 PT Time Calculation (min): 10 min  PT Assessment / Plan / Recommendation  History of Present Illness 58 yo male admitted with hepatic encephalopathy. Hx of bipolar d/o, Hep C, alcoholic cirrhosis, seizure, frequent admissions   PT Comments   Pt ambulated in hallway with one instance of LOB however self corrected using nursing station countertop.  Continue to recommend supervision for OOB due to high fall risk.   Follow Up Recommendations  Supervision for mobility/OOB;No PT follow up     Does the patient have the potential to tolerate intense rehabilitation     Barriers to Discharge        Equipment Recommendations  None recommended by PT    Recommendations for Other Services    Frequency Min 3X/week   Progress towards PT Goals Progress towards PT goals: Progressing toward goals  Plan Current plan remains appropriate    Precautions / Restrictions Precautions Precautions: Fall   Pertinent Vitals/Pain n/a    Mobility  Bed Mobility Overal bed mobility: Independent Transfers Overall transfer level: Modified independent Ambulation/Gait Ambulation/Gait assistance: Supervision Ambulation Distance (Feet): 400 Feet Assistive device: None Gait Pattern/deviations: Wide base of support Gait velocity: WFL General Gait Details: pt with one instance of LOB however able to self correct by stepping to nursing station countertop for support (appeared to trip on his shoe/pants while turning his head)    Exercises     PT Diagnosis:    PT Problem List:   PT Treatment Interventions:     PT Goals (current goals can now be found in the care plan section)    Visit Information  Last PT Received On: 04/10/13 Assistance Needed: +1 History of Present Illness: 58 yo male admitted with hepatic encephalopathy. Hx of bipolar d/o, Hep C, alcoholic cirrhosis,  seizure, frequent admissions    Subjective Data      Cognition  Cognition Arousal/Alertness: Awake/alert Behavior During Therapy: WFL for tasks assessed/performed Overall Cognitive Status: Impaired/Different from baseline Area of Impairment: Safety/judgement;Awareness    Balance     End of Session PT - End of Session Activity Tolerance: Patient tolerated treatment well Patient left: in bed;with call bell/phone within reach;Other (comment) (nsg student) Nurse Communication: Patient requests pain meds (pt in room with nsg student and bed alarm off )   GP     Sufian Ravi,KATHrine E 04/10/2013, 3:45 PM Carmelia Bake, PT, DPT 04/10/2013 Pager: [phone]

## 2013-04-10 NOTE — Progress Notes (Signed)
Patient still asleep. Will not awake for assessment at this time. Will give the patient addition time to rest as he did not get much rest throughout the night. Lacretia Leigh, SN, RCC

## 2013-04-10 NOTE — Progress Notes (Addendum)
Patient is awake and continuing to rest. Lab came to draw blood. Patient states they were unsuccessful, I was not present for the lab work. Patient has had another bowel movement. Patient did allow me to wash his back and lower extremities, not his feet, and he washed his chest, stomach and face. Patient has his lunch and is eating well. Lacretia Leigh, SN, RCC

## 2013-04-10 NOTE — Progress Notes (Signed)
PT Cancellation Note  Patient Details Name: Tony Boyd MRN: [id number] DOB: [birth year]   Cancelled Treatment:    Reason Eval/Treat Not Completed: Patient declined, no reason specified Pt eating breakfast and making phone calls.  Declines therapy at this time, states he is likely d/c home today.   Abdulloh Ullom,KATHrine E 04/10/2013, 9:15 AM Carmelia Bake, PT, DPT 04/10/2013 Pager: [phone]

## 2013-04-10 NOTE — Progress Notes (Signed)
Pt bed alarm remained on all night and pt constantly get up to the bathroom and does not always wait for assistance. Patient is able to ambulate without issues continues to complain of low back pain. Staff remains with patient while in bathroom and reactivate bed alarm. Will continue to monitor. Will administer pain when appropriate time. SRP, RN

## 2013-04-10 NOTE — Progress Notes (Signed)
I have read all student nurses' documentation.

## 2013-04-10 NOTE — Progress Notes (Signed)
Clinical Social Work  CSW attempted to meet with patient but patient currently working with Radio producer. Psych MD has stated in note that patient has capacity to make his own decisions. CSW will follow up at later time to meet with patient and discuss SA options again. Patient previously declined but CSW will ensure patient has not changed his mind.  [place], [place] [phone]

## 2013-04-10 NOTE — Progress Notes (Signed)
Patient is alert. Patient is confused but quite talkative. Patient voided in urinal. Patient stated he fell trying to put his right leg in his pajama pants while standing and fell last night. Patient stated he does not need assistance with ambulating. He is now having increased back pain. Patient stated he was born and raised in [place], [place], lived in [place] until his wife passed, is now living in [place] and upon discharge will be going to his apartment his daughter got him. Patient is complaining of his eyes burning and is requesting eye drops. Lacretia Leigh, SN, RCC

## 2013-04-10 NOTE — Consult Note (Signed)
NEURO HOSPITALIST CONSULT NOTE    Reason for Consult: Seizure  HPI:                                                                                                                                          Tony Boyd is an 58 y.o. male admitted to the hospital due to recurrent hepatic encephalopathy. Patient has known seizure disorder, Korsakoff psychosis, ETOH abuse, liver cirrhosis,Bipolar disorder.  At home he takes Keppra 500 mg BID.  He states his daughter prepares his pills for him and he takes what is placed int the pill container.  He denies missing any doses however he was found in a local hotel with AMS prior to being sent to ED.  HE does admit to drinking about "6-8" beers in a week and recently this past Sunday 04/05/2013 having "drank 3-4 beers".  He was found with AMS at a local hotel and was brought to ED for  AMS. On admission to ED he was noted to have a 2 seizures.  On 04/08/2013 he was noted to have another seizure and Keppra was increased to 750 mg BID.  Patient had further seizure on 04/09/2013 and Keppra was increased to 1000 mg BID today.  Currently he is awake and oriented showing no clinical seizure activity.    EEG has been done--pending reading    04/06/13 1235 04/07/13 1520 04/08/13 0400 04/09/13 1237 04/10/13 0422  NA 119* 130* 127* 127* 128*   Past Medical History  Diagnosis Date  . Cirrhosis of liver   . Hepatitis C   . ETOH abuse   . Hypothyroid   . Psychosis   . Bipolar disorder   . Seizures   . Arthritis   . Back pain   . Coagulopathy     Hx of  . Thrombocytopenia     Hx of  . Pancytopenia     Hx of  . H/O hypokalemia   . Hyponatremia     Hx of  . Korsakoff psychosis   . H/O abdominal abscess   . H/O esophageal varices   . Metabolic encephalopathy   . Hepatic encephalopathy   . Urinary urgency   . H/O renal failure   . H/O ascites   . Altered mental status   . Anemia   . Ascites   . Shortness of breath   .  Peripheral vascular disease   . GERD (gastroesophageal reflux disease)   . Thrombocytopenia, acquired 06/02/2012  . Unspecified hypothyroidism 06/02/2012  . Pain in joint, pelvic region and thigh   . Edema   . Obesity, unspecified   . Chest pain, unspecified   . Pneumonitis due to inhalation of food or vomitus   . Other convulsions   . Cellulitis and abscess of  upper arm and forearm   . Chronic hepatitis C without mention of hepatic coma   . Hypopotassemia   . Anemia, unspecified   . Other and unspecified coagulation defects   . Thrombocytopenia, unspecified   . Bipolar I disorder, most recent episode (or current) unspecified   . Unspecified psychosis   . Encephalopathy   . Esophageal varices without mention of bleeding   . Esophagitis, unspecified   . Abscess of liver(572.0)   . Chronic kidney disease, unspecified   . Lumbago   . Personal history of alcoholism   . Personal history of tobacco use, presenting hazards to health   . Cirrhosis of liver without mention of alcohol   . Pneumonitis due to inhalation of food or vomitus 09/19/2010  . Chronic hepatitis C without mention of hepatic coma   . Bipolar I disorder, most recent episode (or current) unspecified   . Unspecified psychosis   . Other ascites   . Personal history of tobacco use, presenting hazards to health   . Cirrhosis of liver without mention of alcohol   . Type 2 diabetes mellitus with diabetic neuropathy     Past Surgical History  Procedure Laterality Date  . Colostomy    . Cholecystectomy    . Small intestine surgery    . Arm surgery      left arm  . US guided drain placement in ventral hernia abscess  07/21/2010  . Multiple picc line placements    . Esophagogastroduodenoscopy  06/28/2011    Procedure: ESOPHAGOGASTRODUODENOSCOPY (EGD);  Surgeon: Inda Castle, MD;  Location: [facility];  Service: Endoscopy;  Laterality: N/A;  . Esophagogastroduodenoscopy N/A 05/06/2012    Procedure:  ESOPHAGOGASTRODUODENOSCOPY (EGD);  Surgeon: Inda Castle, MD;  Location: [facility];  Service: Endoscopy;  Laterality: N/A;  . Esophageal banding N/A 05/06/2012    Procedure: ESOPHAGEAL BANDING;  Surgeon: Inda Castle, MD;  Location: [facility];  Service: Endoscopy;  Laterality: N/A;    Family History  Problem Relation Age of Onset  . Heart disease Mother     MI  . Lung cancer Brother      Social History:  reports that he quit smoking about 5 years ago. His smoking use included Cigarettes. He smoked 0.00 packs per day. He has never used smokeless tobacco. He reports that he does not drink alcohol or use illicit drugs.  Allergies  Allergen Reactions  . Droperidol Other (See Comments)    unknown  . Penicillins Swelling  . Toradol [Ketorolac Tromethamine] Hives    MEDICATIONS:                                                                                                                     Prior to Admission:  Prescriptions prior to admission  Medication Sig Dispense Refill  . bisacodyl (DULCOLAX) 10 MG suppository Place 10 mg rectally as needed for moderate constipation (constipation).      . ferrous sulfate 325 (65 FE) MG tablet Take 325 mg by  mouth at bedtime.      . folic acid (FOLVITE) 1 MG tablet Take 1 mg by mouth every morning.      . furosemide (LASIX) 40 MG tablet Take 40 mg by mouth 2 (two) times daily.      . insulin glargine (LANTUS) 100 UNIT/ML injection Inject 0.12 mLs (12 Units total) into the skin at bedtime.  10 mL  3  . lactulose (CHRONULAC) 10 GM/15ML solution Take 67.5 mLs (45 g total) by mouth 3 (three) times daily. Can take upto 5 times a day, titrate for 2-3 soft stools per day  240 mL  0  . levETIRAcetam (KEPPRA) 500 MG tablet Take 1 tablet (500 mg total) by mouth 2 (two) times daily.  180 tablet  1  . levothyroxine (SYNTHROID, LEVOTHROID) 75 MCG tablet Take 75 mcg by mouth every morning.       . Multiple Vitamin (MULTIVITAMIN WITH MINERALS) TABS  tablet Take 1 tablet by mouth every morning.      . nadolol (CORGARD) 40 MG tablet Take 1 tablet (40 mg total) by mouth every morning.  90 tablet  1  . omeprazole (PRILOSEC) 20 MG capsule Take 20 mg by mouth daily.      [name] oxyCODONE (OXY IR/ROXICODONE) 5 MG immediate release tablet Take 1 tablet (5 mg total) by mouth every 6 (six) hours as needed for severe pain.  120 tablet  0  . Oxycodone HCl 10 MG TABS Take 10 mg by mouth every 6 (six) hours as needed (pain).      . potassium chloride (K-DUR) 10 MEQ tablet Take 10 mEq by mouth every morning.      . rifaximin (XIFAXAN) 550 MG TABS tablet Take 550 mg by mouth 2 (two) times daily.      [name] spironolactone (ALDACTONE) 100 MG tablet Take 200 mg by mouth every morning.      . risperiDONE (RISPERDAL) 0.25 MG tablet Take 0.25 mg by mouth 2 (two) times daily.       Scheduled: . feeding supplement (GLUCERNA SHAKE)  237 mL Oral Q24H  . ferrous sulfate  325 mg Oral QHS  . folic acid  1 mg Oral q morning - 10a  . furosemide  40 mg Oral BID  . insulin glargine  12 Units Subcutaneous QHS  . lactulose  45 g Oral TID  . levETIRAcetam  1,000 mg Oral BID  . levothyroxine  75 mcg Oral QAC breakfast  . LORazepam  1 mg Oral Once  . nadolol  40 mg Oral q morning - 10a  . pantoprazole  40 mg Oral Daily  . rifaximin  550 mg Oral BID  . sodium chloride  3 mL Intravenous Q12H  . spironolactone  200 mg Oral q morning - 10a     ROS:  History obtained from the patient  General ROS: negative for - chills, fatigue, fever, night sweats, weight gain or weight loss Psychological ROS: negative for - behavioral disorder, hallucinations, memory difficulties, mood swings or suicidal ideation Ophthalmic ROS: negative for - blurry vision, double vision, eye pain or loss of vision ENT ROS: negative for - epistaxis, nasal discharge, oral  lesions, sore throat, tinnitus or vertigo Allergy and Immunology ROS: negative for - hives or itchy/watery eyes Hematological and Lymphatic ROS: negative for - bleeding problems, bruising or swollen lymph nodes Endocrine ROS: negative for - galactorrhea, hair pattern changes, polydipsia/polyuria or temperature intolerance Respiratory ROS: negative for - cough, hemoptysis, shortness of breath or wheezing Cardiovascular ROS: negative for - chest pain, dyspnea on exertion, edema or irregular heartbeat Gastrointestinal ROS: negative for - abdominal pain, diarrhea, hematemesis, nausea/vomiting or stool incontinence Genito-Urinary ROS: negative for - dysuria, hematuria, incontinence or urinary frequency/urgency Musculoskeletal ROS: negative for - joint swelling or muscular weakness Neurological ROS: as noted in HPI Dermatological ROS: negative for rash and skin lesion changes   Blood pressure 101/52, pulse 55, temperature 98.4 F (36.9 C), temperature source Oral, resp. rate 20, height 5\' 8"  (1.727 m), weight 96.299 kg (212 lb 4.8 oz), SpO2 99.00%.   Neurologic Examination:                                                                                                      Mental Status: Alert, oriented, thought content appropriate.  Speech fluent without evidence of aphasia.  Able to follow 3 step commands without difficulty. Cranial Nerves: II: Discs flat bilaterally; Visual fields grossly normal, pupils equal, round, reactive to light and accommodation III,IV, VI: ptosis not present, extra-ocular motions intact bilaterally V,VII: smile symmetric, facial light touch sensation normal bilaterally VIII: hearing normal bilaterally IX,X: gag reflex present XI: bilateral shoulder shrug XII: midline tongue extension without atrophy or fasciculations  Motor: Right : Upper extremity   5/5    Left:     Upper extremity   5/5  Lower extremity   5/5     Lower extremity   5/5 Tone and bulk:normal tone  throughout; no atrophy noted Sensory: Pinprick and light touch intact throughout, bilaterally Deep Tendon Reflexes:  Right: Upper Extremity   Left: Upper extremity   biceps (C-5 to C-6) 2/4   biceps (C-5 to C-6) 2/4 tricep (C7) 2/4    triceps (C7) 2/4 Brachioradialis (C6) 2/4  Brachioradialis (C6) 2/4  Lower Extremity Lower Extremity  quadriceps (L-2 to L-4) 2/4   quadriceps (L-2 to L-4) 2/4 Achilles (S1) 2/4   Achilles (S1) 2/4  Plantars: Right: downgoing   Left: downgoing Cerebellar: normal finger-to-nose,  normal heel-to-shin test Gait: not tested.  CV: pulses palpable throughout    Lab Results: Basic Metabolic Panel:  Recent Labs Lab 04/06/13 1235 04/07/13 1520 04/08/13 0400 04/09/13 1237 04/10/13 0422  NA 119* 130* 127* 127* 128*  K 4.9 4.3 4.1 4.6 4.1  CL 86* 95* 94* 91* 94*  CO2 20 25 22 25 23   GLUCOSE 152* 141* 116* 210* 130*  BUN  12 13 14 14 15   CREATININE 0.58 0.72 0.73 0.75 0.81  CALCIUM 8.0* 8.4 8.0* 8.6 8.4  MG  --   --   --   --  1.7  PHOS  --   --   --   --  4.2    Liver Function Tests:  Recent Labs Lab 04/06/13 1235 04/07/13 1520 04/08/13 0400 04/10/13 0422  AST 70* 58* 58* 66*  ALT 45 43 43 43  ALKPHOS 173* 167* 176* 165*  BILITOT 4.6* 4.4* 2.9* 3.1*  PROT 7.3 6.9 6.9 6.7  ALBUMIN 2.8* 2.7* 2.8* 2.7*   No results found for this basename: LIPASE, AMYLASE,  in the last 168 hours  Recent Labs Lab 04/06/13 1235 04/09/13 2043 04/10/13 1220  AMMONIA 69* RESULTS UNAVAILABLE DUE TO INTERFERING SUBSTANCE 46    CBC:  Recent Labs Lab 04/06/13 1235 04/06/13 1340 04/06/13 1645 04/08/13 0400 04/10/13 0422  WBC SPECIMEN CLOTTED NOTIFIED LISA IN ED 2.9.15 AT 1330 BY W.SHEA,MT. 4.9 4.8 5.4 5.8  NEUTROABS PENDING 3.8 3.6  --   --   HGB SPECIMEN CLOTTED NOTIFIED LISA IN ED 2.9.15 AT 1330 BY W.SHEA,MT. 11.4* 11.3* 11.4* 11.3*  HCT SPECIMEN CLOTTED NOTIFIED LISA IN ED 2.9.15 AT 1330 BY W.SHEA,MT. 31.4* 31.6* 33.5* 32.9*  MCV SPECIMEN  CLOTTED NOTIFIED LISA IN ED 2.9.15 AT 1330 BY W.SHEA,MT. 96.9 97.2 102.1* 101.2*  PLT SPECIMEN CLOTTED NOTIFIED LISA IN ED 2.9.15 AT 1330 BY W.SHEA,MT. 43* 43* 40* 43*    Cardiac Enzymes:  Recent Labs Lab 04/06/13 1645 04/06/13 2240 04/07/13 0420  TROPONINI <0.30 <0.30 <0.30    Lipid Panel: No results found for this basename: CHOL, TRIG, HDL, CHOLHDL, VLDL, LDLCALC,  in the last 168 hours  CBG:  Recent Labs Lab 04/07/13 2210 04/08/13 0750 04/09/13 0734 04/09/13 2249 04/10/13 0807  GLUCAP 146* 124* 107* 142* 102*    Microbiology: Results for orders placed during the hospital encounter of 03/23/13  MRSA PCR SCREENING     Status: None   Collection Time    03/23/13  1:39 PM      Result Value Ref Range Status   MRSA by PCR NEGATIVE  NEGATIVE Final   Comment:            The GeneXpert MRSA Assay (FDA     approved for NASAL specimens     only), is one component of a     comprehensive MRSA colonization     surveillance program. It is not     intended to diagnose MRSA     infection nor to guide or     monitor treatment for     MRSA infections.    Coagulation Studies:  Recent Labs  04/08/13 0400 04/10/13 0422  LABPROT 23.4* 22.8*  INR 2.16* 2.09*    Imaging: Dg Lumbar Spine Complete  04/10/2013   CLINICAL DATA:  Fall.  Back pain.  EXAM: LUMBAR SPINE - COMPLETE 4+ VIEW  COMPARISON:  CT ABD/PELVIS W CM dated 02/18/2013  FINDINGS: Degenerative grade I anterolisthesis of L4 on L5, associated with severe facet arthrosis. L4-L5 vacuum disc. Chronic L3 superior endplate compression fracture without interval change from recent CT. No acute osseous abnormality. Lumbosacral transitional anatomy. Rudimentary T12 ribs.  IMPRESSION: Chronic changes of the lumbar spine.  No acute injury.   Electronically Signed   By: Dereck Ligas M.D.   On: 04/10/2013 00:25   Mr Brain Wo Contrast  04/10/2013   CLINICAL DATA:  Seizure  EXAM: MRI HEAD  WITHOUT CONTRAST  TECHNIQUE: Multiplanar,  multiecho pulse sequences of the brain and surrounding structures were obtained without intravenous contrast.  COMPARISON:  Prior CT from 03/23/2013  FINDINGS: Diffuse prominence of the CSF containing spaces is compatible with mild cerebral atrophy. Confluent T2/FLAIR hyperintensity within the periventricular white matter is most consistent with mild chronic microvascular ischemic changes. Mildly increased T1 signal intensity seen within the basal ganglia bilaterally, which may be related to underlying liver disease. No other focal parenchymal signal abnormality is identified. No mass lesion, midline shift, or extra-axial fluid collection. Ventricles are normal in size without evidence of hydrocephalus. Hippocampi are symmetric bilaterally without signal abnormality.  No diffusion-weighted signal abnormality is identified to suggest acute intracranial infarct. Gray-white matter differentiation is maintained. Normal flow voids are seen within the intracranial vasculature. No intracranial hemorrhage identified.  The cervicomedullary junction is normal. Pituitary gland is within normal limits. Pituitary stalk is midline. The globes and optic nerves demonstrate a normal appearance with normal signal intensity. The  The bone marrow signal intensity is normal. Calvarium is intact. Visualized upper cervical spine is within normal limits.  Scalp soft tissues are unremarkable.  Small amount of fluid density noted within the inferior aspect of the maxillary sinuses bilaterally. Paranasal sinuses are otherwise clear. Small bilateral amount of fluid density noted within the mastoid air cells bilaterally.  IMPRESSION: 1. No acute intracranial infarct or other abnormality identified. 2. Mildly increased T1 signal intensity within the basal ganglia bilaterally, which may be related to underlying liver disease. 3. Mild atrophy with chronic microvascular ischemic disease.   Electronically Signed   By: Jeannine Boga M.D.   On:  04/10/2013 04:13    Etta Quill PA-C [facility] [phone]  04/10/2013, 1:42 PM  Patient seen and examined.  Clinical course and management discussed.  Necessary edits performed.  I agree with the above.  Assessment and plan of care developed and discussed below.  Assessment/Plan: 58 year old male with a history of seizures presenting with altered mental status and breakthrough seizures.  Noncompliance with maintenance Keppra likely since patient was found in a local hotel with altered mental status.  Keppra has been increased from 500mg  BID to 1000mg  BID with no further seizure activity documented since 04/09/2012.  No side effects have been reported.  MRI of the brain reviewed and shows no acute changes.    Recommendations: 1.  Continue Keppra at 1000mg  BID 2.  Continue seizure precautions 3.  Patient to continue follow up at discharge.       Alexis Goodell, MD [facility] [phone]  04/11/2013  10:52 AM

## 2013-04-11 LAB — GLUCOSE, CAPILLARY
GLUCOSE-CAPILLARY: 92 mg/dL (ref 70–99)
Glucose-Capillary: 169 mg/dL — ABNORMAL HIGH (ref 70–99)

## 2013-04-11 MED ORDER — FERROUS SULFATE 325 (65 FE) MG PO TABS: 325.0000 mg | ORAL_TABLET | Freq: Every day | ORAL | Status: DC

## 2013-04-11 MED ORDER — LACTULOSE 10 GM/15ML PO SOLN: 45.0000 g | Freq: Three times a day (TID) | ORAL | Status: DC

## 2013-04-11 MED ORDER — SPIRONOLACTONE 100 MG PO TABS: 200.0000 mg | ORAL_TABLET | Freq: Every morning | ORAL | Status: DC

## 2013-04-11 MED ORDER — OMEPRAZOLE 20 MG PO CPDR: 20.0000 mg | DELAYED_RELEASE_CAPSULE | Freq: Every day | ORAL | Status: DC

## 2013-04-11 MED ORDER — ADULT MULTIVITAMIN W/MINERALS CH: 1.0000 | ORAL_TABLET | Freq: Every morning | ORAL | Status: DC

## 2013-04-11 MED ORDER — OXYCODONE HCL 5 MG PO TABS: 5.0000 mg | ORAL_TABLET | Freq: Four times a day (QID) | ORAL | Status: DC | PRN

## 2013-04-11 MED ORDER — FUROSEMIDE 40 MG PO TABS: 40.0000 mg | ORAL_TABLET | Freq: Two times a day (BID) | ORAL | Status: DC

## 2013-04-11 MED ORDER — RIFAXIMIN 550 MG PO TABS: 550.0000 mg | ORAL_TABLET | Freq: Two times a day (BID) | ORAL | Status: DC

## 2013-04-11 MED ORDER — FOLIC ACID 1 MG PO TABS: 1.0000 mg | ORAL_TABLET | Freq: Every morning | ORAL | Status: DC

## 2013-04-11 MED ORDER — INSULIN GLARGINE 100 UNIT/ML ~~LOC~~ SOLN: 12.0000 [IU] | Freq: Every day | SUBCUTANEOUS | Status: DC

## 2013-04-11 MED ORDER — NADOLOL 40 MG PO TABS: 40.0000 mg | ORAL_TABLET | Freq: Every morning | ORAL | Status: DC

## 2013-04-11 MED ORDER — LEVOTHYROXINE SODIUM 75 MCG PO TABS: 75.0000 ug | ORAL_TABLET | Freq: Every morning | ORAL | Status: DC

## 2013-04-11 MED ORDER — LEVETIRACETAM 500 MG PO TABS: 1000.0000 mg | ORAL_TABLET | Freq: Two times a day (BID) | ORAL | Status: DC

## 2013-04-11 NOTE — Discharge Instructions (Signed)

## 2013-04-12 NOTE — Discharge Summary (Addendum)
Physician Discharge Summary  Tony Boyd [id number] DOB: [birth year] DOA: 04/06/2013  PCP: Hollace Kinnier, DO  Admit date: 04/06/2013 Discharge date: 04/12/2013  Time spent: 35 minutes  Recommendations for Outpatient Follow-up:  1. Follow up with primary care provider as scheduled in 4 days  Recommendations for primary care physician for things to follow:  Repeat CBC, CMP  Discharge Diagnoses:  Principal Problem:   Toxic metabolic encephalopathy Active Problems:   Hepatic encephalopathy   ETOH abuse   Cirrhosis   Esophageal varices without mention of bleeding   Seizure disorder   Thrombocytopenia, acquired   Diabetes mellitus type 2, uncontrolled, with complications   Anemia of chronic disease   Severe protein-calorie malnutrition   Chronic back pain   Malnutrition of moderate degree  Discharge Condition: Stable  Diet recommendation: Low-salt  Filed Weights   04/09/13 0512 04/10/13 0651 04/11/13 0531  Weight: 97.6 kg (215 lb 2.7 oz) 96.299 kg (212 lb 4.8 oz) 98.476 kg (217 lb 1.6 oz)   History of present illness:  Pt w/ hx of 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 Ammonia elevated in ED. [facility] consulted to admit for treatment   Hospital Course:  Likely seizure from not taking Keppra -  Increased Keppra dose from 500 to 750 twice a day, however patient had additional seizures on that dose. His dose was increased to 1000 mg twice a day without further seizure episodes. Neurology has been consulted while hospitalized. He had an MRI which was negative for acute intracranial abnormalities. Toxic metabolic encephalopathy-probably related to subacute alcohol intoxication. Could also be secondary to seizure from not being compliant on his Keppra. Resolved on discharge. Hepatic encephalopathy- resolved on discharge  Cirrhosis + end-stage liver disease -  Continue home medications without  changes on discharge. Patient counseled against alcohol use. He will followup with his regular doctor in 5 days. 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 Nutrition input  Chronic back pain-careful implementation of oxycodone  Hyponatremia - fluid restrict  Procedures:  EEG - read pending   Consultations:   Neurology  Discharge Exam: Filed Vitals:   04/10/13 1039 04/10/13 1408 04/10/13 2059 04/11/13 0531  BP: 101/52 90/39 101/49 106/54  Pulse: 55 59 64 61  Temp: 98.4 F (36.9 C) 98.3 F (36.8 C) 98.6 F (37 C) 98.2 F (36.8 C)  TempSrc: Oral Oral Oral Oral  Resp: 20 16 18 18   Height:      Weight:    98.476 kg (217 lb 1.6 oz)  SpO2: 99% 99% 98% 94%    General:  No acute distress Cardiovascular:  Regular rate and rhythm  Respiratory:  Clear to auscultation  Discharge Instructions   Future Appointments Provider Department Dept Phone   04/16/2013 2:15 PM Pricilla Larsson, NP [place] [phone]       Medication List    STOP taking these medications       potassium chloride 10 MEQ tablet  Commonly known as:  K-DUR      TAKE these medications       bisacodyl 10 MG suppository  Commonly known as:  DULCOLAX  Place 10 mg rectally as needed for moderate constipation (constipation).     ferrous sulfate 325 (65 FE) MG tablet  Take 1 tablet (325 mg total)  by mouth at bedtime.     folic acid 1 MG tablet  Commonly known as:  FOLVITE  Take 1 tablet (1 mg total) by mouth every morning.     furosemide 40 MG tablet  Commonly known as:  LASIX  Take 1 tablet (40 mg total) by mouth 2 (two) times daily.     insulin glargine 100 UNIT/ML injection  Commonly known as:  LANTUS  Inject 0.12 mLs (12 Units total) into the skin at bedtime.     lactulose 10  GM/15ML solution  Commonly known as:  CHRONULAC  Take 67.5 mLs (45 g total) by mouth 3 (three) times daily. Can take upto 5 times a day, titrate for 2-3 soft stools per day     levETIRAcetam 500 MG tablet  Commonly known as:  KEPPRA  Take 2 tablets (1,000 mg total) by mouth 2 (two) times daily.     levothyroxine 75 MCG tablet  Commonly known as:  SYNTHROID, LEVOTHROID  Take 1 tablet (75 mcg total) by mouth every morning.     multivitamin with minerals Tabs tablet  Take 1 tablet by mouth every morning.     nadolol 40 MG tablet  Commonly known as:  CORGARD  Take 1 tablet (40 mg total) by mouth every morning.     omeprazole 20 MG capsule  Commonly known as:  PRILOSEC  Take 1 capsule (20 mg total) by mouth daily.     oxyCODONE 5 MG immediate release tablet  Commonly known as:  Oxy IR/ROXICODONE  Take 1 tablet (5 mg total) by mouth every 6 (six) hours as needed for severe pain.     rifaximin 550 MG Tabs tablet  Commonly known as:  XIFAXAN  Take 1 tablet (550 mg total) by mouth 2 (two) times daily.     risperiDONE 0.25 MG tablet  Commonly known as:  RISPERDAL  Take 0.25 mg by mouth 2 (two) times daily.     spironolactone 100 MG tablet  Commonly known as:  ALDACTONE  Take 2 tablets (200 mg total) by mouth every morning.           Follow-up Information   Follow up with REED, TIFFANY, DO. Schedule an appointment as soon as possible for a visit in 4 days.   Specialty:  Geriatric Medicine   Contact information:   [address] [place] [address] [phone]       The results of significant diagnostics from this hospitalization (including imaging, microbiology, ancillary and laboratory) are listed below for reference.    Significant Diagnostic Studies: Dg Lumbar Spine Complete  04/10/2013   CLINICAL DATA:  Fall.  Back pain.  EXAM: LUMBAR SPINE - COMPLETE 4+ VIEW  COMPARISON:  CT ABD/PELVIS W CM dated 02/18/2013  FINDINGS: Degenerative grade I anterolisthesis of L4 on  L5, associated with severe facet arthrosis. L4-L5 vacuum disc. Chronic L3 superior endplate compression fracture without interval change from recent CT. No acute osseous abnormality. Lumbosacral transitional anatomy. Rudimentary T12 ribs.  IMPRESSION: Chronic changes of the lumbar spine.  No acute injury.   Electronically Signed   By: Dereck Ligas M.D.   On: 04/10/2013 00:25   Ct Head Wo Contrast  03/23/2013   CLINICAL DATA:  Altered mental status, lethargy, lightheadedness.  EXAM: CT HEAD WITHOUT CONTRAST  TECHNIQUE: Contiguous axial images were obtained from the base of the skull through the vertex without intravenous contrast.  COMPARISON:  CT head January 11, 2013  FINDINGS: The ventricles and sulci are  normal for age. No intraparenchymal hemorrhage, mass effect nor midline shift. Minimal patchy supratentorial white matter hypodensities are within normal range for patient's age and though non-specific suggest sequelae of chronic small vessel ischemic disease. No acute large vascular territory infarcts.  No abnormal extra-axial fluid collections. Basal cisterns are patent. Moderate calcific atherosclerosis of the carotid siphons.  No skull fracture. Small left maxillary mucosal retention cysts without paranasal sinus air fluid levels. Trace soft tissue density within the left mastoid tip, improved. Soft tissue within the external auditory canals bilaterally may reflect cerumen. The included ocular globes and orbital contents are non-suspicious.  IMPRESSION: No acute intracranial process.   Electronically Signed   By: Elon Alas   On: 03/23/2013 04:01   Mr Brain Wo Contrast  04/10/2013   CLINICAL DATA:  Seizure  EXAM: MRI HEAD WITHOUT CONTRAST  TECHNIQUE: Multiplanar, multiecho pulse sequences of the brain and surrounding structures were obtained without intravenous contrast.  COMPARISON:  Prior CT from 03/23/2013  FINDINGS: Diffuse prominence of the CSF containing spaces is compatible with mild  cerebral atrophy. Confluent T2/FLAIR hyperintensity within the periventricular white matter is most consistent with mild chronic microvascular ischemic changes. Mildly increased T1 signal intensity seen within the basal ganglia bilaterally, which may be related to underlying liver disease. No other focal parenchymal signal abnormality is identified. No mass lesion, midline shift, or extra-axial fluid collection. Ventricles are normal in size without evidence of hydrocephalus. Hippocampi are symmetric bilaterally without signal abnormality.  No diffusion-weighted signal abnormality is identified to suggest acute intracranial infarct. Gray-white matter differentiation is maintained. Normal flow voids are seen within the intracranial vasculature. No intracranial hemorrhage identified.  The cervicomedullary junction is normal. Pituitary gland is within normal limits. Pituitary stalk is midline. The globes and optic nerves demonstrate a normal appearance with normal signal intensity. The  The bone marrow signal intensity is normal. Calvarium is intact. Visualized upper cervical spine is within normal limits.  Scalp soft tissues are unremarkable.  Small amount of fluid density noted within the inferior aspect of the maxillary sinuses bilaterally. Paranasal sinuses are otherwise clear. Small bilateral amount of fluid density noted within the mastoid air cells bilaterally.  IMPRESSION: 1. No acute intracranial infarct or other abnormality identified. 2. Mildly increased T1 signal intensity within the basal ganglia bilaterally, which may be related to underlying liver disease. 3. Mild atrophy with chronic microvascular ischemic disease.   Electronically Signed   By: Jeannine Boga M.D.   On: 04/10/2013 04:13   Dg Chest Portable 1 View  03/23/2013   CLINICAL DATA:  Altered mental status, shortness of breath.  EXAM: PORTABLE CHEST - 1 VIEW  COMPARISON:  Chest radiograph February 18, 2013  FINDINGS: Cardiac silhouette  appears mildly enlarged unchanged, mediastinal silhouette is unremarkable. Similar central pulmonary vasculature congestion with increasing mild interstitial prominence. No pleural effusions or focal consolidations. No pneumothorax.  Multiple EKG lines overlie the patient and may obscure subtle underlying pathology. Soft tissue planes and included osseous structures are nonsuspicious.  IMPRESSION: Mild cardiomegaly with increasing mild interstitial prominence which may reflect interstitial edema.   Electronically Signed   By: Elon Alas   On: 03/23/2013 02:01   US Abdomen Limited Ruq  04/07/2013   CLINICAL DATA:  Liver mass.  Cholecystectomy.  History of cirrhosis.  EXAM: US ABDOMEN LIMITED - RIGHT UPPER QUADRANT  COMPARISON:  CT ABD/PELVIS W CM dated 02/18/2013  FINDINGS: Gallbladder:  Cholecystectomy.  Common bile duct:  Diameter: 7 mm, within normal limits status  post cholecystectomy.  Liver:  No discrete mass identified by ultrasound. Nodular contour compatible with hepatic cirrhosis. Diffusely increased echotexture.  IMPRESSION: 1. Hepatic cirrhosis without a focal mass identified. 2. Cholecystectomy.   Electronically Signed   By: Dereck Ligas M.D.   On: 04/07/2013 00:58   Labs: Basic Metabolic Panel:  Recent Labs Lab 04/06/13 1235 04/07/13 1520 04/08/13 0400 04/09/13 1237 04/10/13 0422  NA 119* 130* 127* 127* 128*  K 4.9 4.3 4.1 4.6 4.1  CL 86* 95* 94* 91* 94*  CO2 20 25 22 25 23   GLUCOSE 152* 141* 116* 210* 130*  BUN 12 13 14 14 15   CREATININE 0.58 0.72 0.73 0.75 0.81  CALCIUM 8.0* 8.4 8.0* 8.6 8.4  MG  --   --   --   --  1.7  PHOS  --   --   --   --  4.2   Liver Function Tests:  Recent Labs Lab 04/06/13 1235 04/07/13 1520 04/08/13 0400 04/10/13 0422  AST 70* 58* 58* 66*  ALT 45 43 43 43  ALKPHOS 173* 167* 176* 165*  BILITOT 4.6* 4.4* 2.9* 3.1*  PROT 7.3 6.9 6.9 6.7  ALBUMIN 2.8* 2.7* 2.8* 2.7*   No results found for this basename: LIPASE, AMYLASE,  in the  last 168 hours  Recent Labs Lab 04/06/13 1235 04/09/13 2043 04/10/13 1220  AMMONIA 69* RESULTS UNAVAILABLE DUE TO INTERFERING SUBSTANCE 46   CBC:  Recent Labs Lab 04/06/13 1235 04/06/13 1340 04/06/13 1645 04/08/13 0400 04/10/13 0422  WBC SPECIMEN CLOTTED NOTIFIED LISA IN ED 2.9.15 AT 1330 BY W.SHEA,MT. 4.9 4.8 5.4 5.8  NEUTROABS PENDING 3.8 3.6  --   --   HGB SPECIMEN CLOTTED NOTIFIED LISA IN ED 2.9.15 AT 1330 BY W.SHEA,MT. 11.4* 11.3* 11.4* 11.3*  HCT SPECIMEN CLOTTED NOTIFIED LISA IN ED 2.9.15 AT 1330 BY W.SHEA,MT. 31.4* 31.6* 33.5* 32.9*  MCV SPECIMEN CLOTTED NOTIFIED LISA IN ED 2.9.15 AT 1330 BY W.SHEA,MT. 96.9 97.2 102.1* 101.2*  PLT SPECIMEN CLOTTED NOTIFIED LISA IN ED 2.9.15 AT 1330 BY W.SHEA,MT. 43* 43* 40* 43*   Cardiac Enzymes:  Recent Labs Lab 04/06/13 1645 04/06/13 2240 04/07/13 0420  TROPONINI <0.30 <0.30 <0.30   BNP: BNP (last 3 results)  Recent Labs  12/03/12 1435 01/11/13 2055 02/18/13 0234  PROBNP 297.7* 207.4* 985.8*   CBG:  Recent Labs Lab 04/09/13 0734 04/09/13 2249 04/10/13 0807 04/10/13 2055 04/11/13 0757  GLUCAP 107* 142* 102* 169* 92    Signed:  GHERGHE, COSTIN  [facility] 04/12/2013, 12:28 PM

## 2013-04-15 ENCOUNTER — Encounter (HOSPITAL_COMMUNITY): Payer: Self-pay | Admitting: Emergency Medicine

## 2013-04-15 ENCOUNTER — Inpatient Hospital Stay (HOSPITAL_COMMUNITY)
Admission: EM | Admit: 2013-04-15 | Discharge: 2013-04-19 | DRG: 442 | Disposition: A | Discharge status: Home Health | Payer: Medicare Other | Attending: Internal Medicine | Admitting: Internal Medicine

## 2013-04-15 DIAGNOSIS — F1020 Alcohol dependence, uncomplicated: Secondary | ICD-10-CM | POA: Diagnosis present

## 2013-04-15 DIAGNOSIS — K219 Gastro-esophageal reflux disease without esophagitis: Secondary | ICD-10-CM | POA: Diagnosis present

## 2013-04-15 DIAGNOSIS — K7030 Alcoholic cirrhosis of liver without ascites: Secondary | ICD-10-CM | POA: Diagnosis present

## 2013-04-15 DIAGNOSIS — F068 Other specified mental disorders due to known physiological condition: Secondary | ICD-10-CM

## 2013-04-15 DIAGNOSIS — K920 Hematemesis: Secondary | ICD-10-CM | POA: Diagnosis present

## 2013-04-15 DIAGNOSIS — D62 Acute posthemorrhagic anemia: Secondary | ICD-10-CM | POA: Diagnosis present

## 2013-04-15 DIAGNOSIS — F04 Amnestic disorder due to known physiological condition: Secondary | ICD-10-CM | POA: Diagnosis present

## 2013-04-15 DIAGNOSIS — I8500 Esophageal varices without bleeding: Secondary | ICD-10-CM

## 2013-04-15 DIAGNOSIS — Z794 Long term (current) use of insulin: Secondary | ICD-10-CM

## 2013-04-15 DIAGNOSIS — R601 Generalized edema: Secondary | ICD-10-CM

## 2013-04-15 DIAGNOSIS — B1920 Unspecified viral hepatitis C without hepatic coma: Secondary | ICD-10-CM

## 2013-04-15 DIAGNOSIS — R188 Other ascites: Secondary | ICD-10-CM | POA: Diagnosis present

## 2013-04-15 DIAGNOSIS — W010XXA Fall on same level from slipping, tripping and stumbling without subsequent striking against object, initial encounter: Secondary | ICD-10-CM | POA: Diagnosis present

## 2013-04-15 DIAGNOSIS — F1010 Alcohol abuse, uncomplicated: Secondary | ICD-10-CM | POA: Diagnosis present

## 2013-04-15 DIAGNOSIS — IMO0002 Reserved for concepts with insufficient information to code with codable children: Secondary | ICD-10-CM

## 2013-04-15 DIAGNOSIS — K7460 Unspecified cirrhosis of liver: Secondary | ICD-10-CM

## 2013-04-15 DIAGNOSIS — D61818 Other pancytopenia: Secondary | ICD-10-CM | POA: Diagnosis present

## 2013-04-15 DIAGNOSIS — K319 Disease of stomach and duodenum, unspecified: Secondary | ICD-10-CM | POA: Diagnosis present

## 2013-04-15 DIAGNOSIS — Z87891 Personal history of nicotine dependence: Secondary | ICD-10-CM

## 2013-04-15 DIAGNOSIS — R4182 Altered mental status, unspecified: Secondary | ICD-10-CM

## 2013-04-15 DIAGNOSIS — Z933 Colostomy status: Secondary | ICD-10-CM

## 2013-04-15 DIAGNOSIS — D500 Iron deficiency anemia secondary to blood loss (chronic): Secondary | ICD-10-CM | POA: Diagnosis present

## 2013-04-15 DIAGNOSIS — R791 Abnormal coagulation profile: Secondary | ICD-10-CM | POA: Diagnosis present

## 2013-04-15 DIAGNOSIS — I739 Peripheral vascular disease, unspecified: Secondary | ICD-10-CM | POA: Diagnosis present

## 2013-04-15 DIAGNOSIS — E871 Hypo-osmolality and hyponatremia: Secondary | ICD-10-CM | POA: Diagnosis present

## 2013-04-15 DIAGNOSIS — K922 Gastrointestinal hemorrhage, unspecified: Secondary | ICD-10-CM | POA: Diagnosis present

## 2013-04-15 DIAGNOSIS — N189 Chronic kidney disease, unspecified: Secondary | ICD-10-CM | POA: Diagnosis present

## 2013-04-15 DIAGNOSIS — G8929 Other chronic pain: Secondary | ICD-10-CM

## 2013-04-15 DIAGNOSIS — G40909 Epilepsy, unspecified, not intractable, without status epilepticus: Secondary | ICD-10-CM | POA: Diagnosis present

## 2013-04-15 DIAGNOSIS — E1149 Type 2 diabetes mellitus with other diabetic neurological complication: Secondary | ICD-10-CM | POA: Diagnosis present

## 2013-04-15 DIAGNOSIS — M129 Arthropathy, unspecified: Secondary | ICD-10-CM | POA: Diagnosis present

## 2013-04-15 DIAGNOSIS — Y9301 Activity, walking, marching and hiking: Secondary | ICD-10-CM

## 2013-04-15 DIAGNOSIS — E1165 Type 2 diabetes mellitus with hyperglycemia: Secondary | ICD-10-CM

## 2013-04-15 DIAGNOSIS — M549 Dorsalgia, unspecified: Secondary | ICD-10-CM

## 2013-04-15 DIAGNOSIS — E1142 Type 2 diabetes mellitus with diabetic polyneuropathy: Secondary | ICD-10-CM | POA: Diagnosis present

## 2013-04-15 DIAGNOSIS — E118 Type 2 diabetes mellitus with unspecified complications: Secondary | ICD-10-CM

## 2013-04-15 DIAGNOSIS — I8510 Secondary esophageal varices without bleeding: Secondary | ICD-10-CM | POA: Diagnosis present

## 2013-04-15 DIAGNOSIS — D638 Anemia in other chronic diseases classified elsewhere: Secondary | ICD-10-CM | POA: Diagnosis present

## 2013-04-15 DIAGNOSIS — D696 Thrombocytopenia, unspecified: Secondary | ICD-10-CM | POA: Diagnosis present

## 2013-04-15 DIAGNOSIS — E039 Hypothyroidism, unspecified: Secondary | ICD-10-CM | POA: Diagnosis present

## 2013-04-15 DIAGNOSIS — K766 Portal hypertension: Principal | ICD-10-CM | POA: Diagnosis present

## 2013-04-15 HISTORY — DX: Wedge compression fracture of third lumbar vertebra, initial encounter for closed fracture: S32.030A

## 2013-04-15 HISTORY — DX: Ventral hernia without obstruction or gangrene: K43.9

## 2013-04-15 HISTORY — DX: Hypothyroidism, unspecified: E03.9

## 2013-04-15 HISTORY — DX: Obesity, unspecified: E66.9

## 2013-04-15 HISTORY — DX: Esophageal varices without bleeding: I85.00

## 2013-04-15 LAB — GLUCOSE, CAPILLARY
GLUCOSE-CAPILLARY: 149 mg/dL — AB (ref 70–99)
Glucose-Capillary: 131 mg/dL — ABNORMAL HIGH (ref 70–99)
Glucose-Capillary: 146 mg/dL — ABNORMAL HIGH (ref 70–99)

## 2013-04-15 LAB — CBC
HEMATOCRIT: 30.2 % — AB (ref 39.0–52.0)
Hemoglobin: 11.1 g/dL — ABNORMAL LOW (ref 13.0–17.0)
MCH: 35.5 pg — AB (ref 26.0–34.0)
MCHC: 36.8 g/dL — AB (ref 30.0–36.0)
MCV: 96.5 fL (ref 78.0–100.0)
Platelets: 51 10*3/uL — ABNORMAL LOW (ref 150–400)
RBC: 3.13 MIL/uL — ABNORMAL LOW (ref 4.22–5.81)
RDW: 14.9 % (ref 11.5–15.5)
WBC: 7.5 10*3/uL (ref 4.0–10.5)

## 2013-04-15 LAB — ABO/RH: ABO/RH(D): A POS

## 2013-04-15 LAB — PROTIME-INR
INR: 2.02 — AB (ref 0.00–1.49)
Prothrombin Time: 22.2 seconds — ABNORMAL HIGH (ref 11.6–15.2)

## 2013-04-15 LAB — TYPE AND SCREEN
ABO/RH(D): A POS
ANTIBODY SCREEN: NEGATIVE

## 2013-04-15 LAB — AMMONIA: AMMONIA: 85 umol/L — AB (ref 11–60)

## 2013-04-15 LAB — HEMOGLOBIN AND HEMATOCRIT, BLOOD
HCT: 28.9 % — ABNORMAL LOW (ref 39.0–52.0)
HCT: 28.5 % — ABNORMAL LOW (ref 39.0–52.0)
HEMOGLOBIN: 10.8 g/dL — AB (ref 13.0–17.0)
HEMOGLOBIN: 10.5 g/dL — AB (ref 13.0–17.0)

## 2013-04-15 LAB — LIPASE, BLOOD: LIPASE: 88 U/L — AB (ref 11–59)

## 2013-04-15 MED ORDER — NADOLOL 40 MG PO TABS
40.0000 mg | ORAL_TABLET | Freq: Every day | ORAL | Status: DC
  Administered 2013-04-16 – 2013-04-19 (×4): 40 mg via ORAL
  Filled 2013-04-15 (×4): qty 1

## 2013-04-15 MED ORDER — ONDANSETRON HCL 4 MG/2ML IJ SOLN
4.0000 mg | Freq: Once | INTRAMUSCULAR | Status: AC
  Administered 2013-04-15: 4 mg via INTRAVENOUS
  Filled 2013-04-15: qty 2

## 2013-04-15 MED ORDER — SODIUM CHLORIDE 0.9 % IV BOLUS (SEPSIS)
1000.0000 mL | Freq: Once | INTRAVENOUS | Status: AC
  Administered 2013-04-15: 1000 mL via INTRAVENOUS

## 2013-04-15 MED ORDER — ONDANSETRON HCL 4 MG PO TABS: 4.0000 mg | ORAL_TABLET | Freq: Four times a day (QID) | ORAL | Status: DC | PRN

## 2013-04-15 MED ORDER — DEXTROSE 5 % IV SOLN
5.0000 mg | Freq: Every day | INTRAVENOUS | Status: AC
  Administered 2013-04-15 – 2013-04-17 (×3): 5 mg via INTRAVENOUS
  Filled 2013-04-15 (×3): qty 0.5

## 2013-04-15 MED ORDER — OXYCODONE HCL 5 MG PO TABS
5.0000 mg | ORAL_TABLET | Freq: Four times a day (QID) | ORAL | Status: DC | PRN
  Administered 2013-04-15 – 2013-04-19 (×13): 5 mg via ORAL
  Filled 2013-04-15 (×13): qty 1

## 2013-04-15 MED ORDER — ONDANSETRON HCL 4 MG/2ML IJ SOLN: 4.0000 mg | Freq: Four times a day (QID) | INTRAMUSCULAR | Status: DC | PRN

## 2013-04-15 MED ORDER — LEVETIRACETAM 500 MG PO TABS
1000.0000 mg | ORAL_TABLET | Freq: Two times a day (BID) | ORAL | Status: DC
  Administered 2013-04-15 – 2013-04-19 (×9): 1000 mg via ORAL
  Filled 2013-04-15 (×11): qty 2

## 2013-04-15 MED ORDER — LORAZEPAM 1 MG PO TABS
1.0000 mg | ORAL_TABLET | Freq: Four times a day (QID) | ORAL | Status: AC | PRN
  Administered 2013-04-15 – 2013-04-18 (×6): 1 mg via ORAL
  Filled 2013-04-15 (×6): qty 1

## 2013-04-15 MED ORDER — SODIUM CHLORIDE 0.9 % IV SOLN
25.0000 ug/h | INTRAVENOUS | Status: DC
  Administered 2013-04-15: 25 ug/h via INTRAVENOUS
  Filled 2013-04-15 (×4): qty 1

## 2013-04-15 MED ORDER — RIFAXIMIN 550 MG PO TABS
550.0000 mg | ORAL_TABLET | Freq: Two times a day (BID) | ORAL | Status: DC
  Administered 2013-04-15 – 2013-04-19 (×8): 550 mg via ORAL
  Filled 2013-04-15 (×10): qty 1

## 2013-04-15 MED ORDER — LORAZEPAM 1 MG PO TABS: 1.0000 mg | ORAL_TABLET | Freq: Four times a day (QID) | ORAL | Status: DC | PRN

## 2013-04-15 MED ORDER — SODIUM CHLORIDE 0.9 % IV SOLN: INTRAVENOUS | Status: DC

## 2013-04-15 MED ORDER — OCTREOTIDE LOAD VIA INFUSION
50.0000 ug | Freq: Once | INTRAVENOUS | Status: DC
  Filled 2013-04-15: qty 25

## 2013-04-15 MED ORDER — INSULIN ASPART 100 UNIT/ML ~~LOC~~ SOLN
0.0000 [IU] | SUBCUTANEOUS | Status: DC
  Administered 2013-04-15 – 2013-04-16 (×4): 1 [IU] via SUBCUTANEOUS
  Administered 2013-04-16 – 2013-04-18 (×4): 2 [IU] via SUBCUTANEOUS
  Administered 2013-04-18 (×2): 1 [IU] via SUBCUTANEOUS

## 2013-04-15 MED ORDER — SODIUM CHLORIDE 0.9 % IV SOLN
25.0000 ug/h | INTRAVENOUS | Status: DC
  Filled 2013-04-15 (×2): qty 1

## 2013-04-15 MED ORDER — FOLIC ACID 1 MG PO TABS
1.0000 mg | ORAL_TABLET | Freq: Every day | ORAL | Status: DC
  Administered 2013-04-15 – 2013-04-19 (×4): 1 mg via ORAL
  Filled 2013-04-15 (×5): qty 1

## 2013-04-15 MED ORDER — DEXTROSE 5 % IV SOLN
5.0000 mg | Freq: Once | INTRAVENOUS | Status: DC
  Filled 2013-04-15: qty 0.5

## 2013-04-15 MED ORDER — OCTREOTIDE LOAD VIA INFUSION
50.0000 ug | Freq: Once | INTRAVENOUS | Status: AC
  Administered 2013-04-15: 50 ug via INTRAVENOUS
  Filled 2013-04-15: qty 25

## 2013-04-15 MED ORDER — LORAZEPAM 2 MG/ML IJ SOLN: 1.0000 mg | Freq: Four times a day (QID) | INTRAMUSCULAR | Status: AC | PRN

## 2013-04-15 MED ORDER — VITAMIN B-1 100 MG PO TABS
100.0000 mg | ORAL_TABLET | Freq: Every day | ORAL | Status: DC
  Administered 2013-04-15 – 2013-04-19 (×4): 100 mg via ORAL
  Filled 2013-04-15 (×5): qty 1

## 2013-04-15 MED ORDER — LEVOTHYROXINE SODIUM 75 MCG PO TABS
75.0000 ug | ORAL_TABLET | Freq: Every day | ORAL | Status: DC
  Administered 2013-04-16 – 2013-04-19 (×3): 75 ug via ORAL
  Filled 2013-04-15 (×6): qty 1

## 2013-04-15 MED ORDER — LORAZEPAM 2 MG/ML IJ SOLN
0.0000 mg | Freq: Four times a day (QID) | INTRAMUSCULAR | Status: AC
  Filled 2013-04-15: qty 1

## 2013-04-15 MED ORDER — LORAZEPAM 2 MG/ML IJ SOLN: 0.0000 mg | Freq: Two times a day (BID) | INTRAMUSCULAR | Status: AC

## 2013-04-15 MED ORDER — PAROXETINE HCL 20 MG PO TABS
20.0000 mg | ORAL_TABLET | Freq: Every day | ORAL | Status: DC
  Administered 2013-04-15 – 2013-04-19 (×5): 20 mg via ORAL
  Filled 2013-04-15 (×5): qty 1

## 2013-04-15 MED ORDER — PANTOPRAZOLE SODIUM 40 MG PO TBEC
40.0000 mg | DELAYED_RELEASE_TABLET | Freq: Two times a day (BID) | ORAL | Status: DC
  Administered 2013-04-15 – 2013-04-19 (×8): 40 mg via ORAL
  Filled 2013-04-15 (×8): qty 1

## 2013-04-15 MED ORDER — THIAMINE HCL 100 MG/ML IJ SOLN
100.0000 mg | Freq: Every day | INTRAMUSCULAR | Status: DC
  Filled 2013-04-15 (×5): qty 1

## 2013-04-15 MED ORDER — ADULT MULTIVITAMIN W/MINERALS CH
1.0000 | ORAL_TABLET | Freq: Every day | ORAL | Status: DC
  Administered 2013-04-15 – 2013-04-19 (×4): 1 via ORAL
  Filled 2013-04-15 (×5): qty 1

## 2013-04-15 MED ORDER — SODIUM CHLORIDE 0.9 % IV SOLN
INTRAVENOUS | Status: DC
  Administered 2013-04-15 – 2013-04-18 (×2): via INTRAVENOUS

## 2013-04-15 MED ORDER — PANTOPRAZOLE SODIUM 40 MG PO TBEC: 40.0000 mg | DELAYED_RELEASE_TABLET | Freq: Every day | ORAL | Status: DC

## 2013-04-15 MED ORDER — LACTULOSE 10 GM/15ML PO SOLN
45.0000 g | Freq: Three times a day (TID) | ORAL | Status: DC
  Administered 2013-04-15 – 2013-04-16 (×4): 45 g via ORAL
  Filled 2013-04-15 (×8): qty 90

## 2013-04-15 MED ORDER — RISPERIDONE 0.25 MG PO TABS
0.2500 mg | ORAL_TABLET | Freq: Two times a day (BID) | ORAL | Status: DC
  Administered 2013-04-15 – 2013-04-19 (×8): 0.25 mg via ORAL
  Filled 2013-04-15 (×9): qty 1

## 2013-04-15 MED ORDER — SODIUM CHLORIDE 0.9 % IJ SOLN: 3.0000 mL | Freq: Two times a day (BID) | INTRAMUSCULAR | Status: DC

## 2013-04-15 MED ORDER — CIPROFLOXACIN IN D5W 400 MG/200ML IV SOLN
400.0000 mg | Freq: Two times a day (BID) | INTRAVENOUS | Status: DC
  Administered 2013-04-16 – 2013-04-18 (×6): 400 mg via INTRAVENOUS
  Filled 2013-04-15 (×11): qty 200

## 2013-04-15 MED ORDER — LORAZEPAM 2 MG/ML IJ SOLN: 1.0000 mg | Freq: Four times a day (QID) | INTRAMUSCULAR | Status: DC | PRN

## 2013-04-15 NOTE — ED Provider Notes (Signed)
CSN: [id number]     Arrival date & time 04/15/13  1230 History   First MD Initiated Contact with Patient 04/15/13 1232     Chief Complaint  Patient presents with  . Hemoptysis     (Consider location/radiation/quality/duration/timing/severity/associated sxs/prior Treatment) HPI Comments: Patient is a 58 year old male with an extensive past medical history including cirrhosis, hepatitis C, alcohol abuse and esophageal varices who presents to the emergency department via EMS after having 3 episodes of "coffee-ground" emesis over the past 2 days. Patient states over the past week he has had generalized abdominal pain, distention and dizziness. Today his episode of bloody emesis lasted about 20 minutes. States he has been intermittently confused over the past week. Admits to alcohol use, last had 2 beers yesterday. 2 days ago he was walking [place] a railroad track, tripped and fell, he did not hit his head or lose consciousness however sustained a few bruises on both of his arms. Denies diarrhea, fever or chills. It is noted he has had multiple admissions for hepatic encephalopathy and altered mental status.  The history is provided by the patient and medical records.    Past Medical History  Diagnosis Date  . Cirrhosis of liver   . Hepatitis C   . ETOH abuse   . Hypothyroid   . Psychosis   . Bipolar disorder   . Seizures   . Arthritis   . Back pain   . Coagulopathy     Hx of  . Thrombocytopenia     Hx of  . Pancytopenia     Hx of  . H/O hypokalemia   . Hyponatremia     Hx of  . Korsakoff psychosis   . H/O abdominal abscess   . H/O esophageal varices   . Metabolic encephalopathy   . Hepatic encephalopathy   . Urinary urgency   . H/O renal failure   . H/O ascites   . Altered mental status   . Anemia   . Ascites   . Shortness of breath   . Peripheral vascular disease   . GERD (gastroesophageal reflux disease)   . Thrombocytopenia, acquired 06/02/2012  . Unspecified hypothyroidism  06/02/2012  . Pain in joint, pelvic region and thigh   . Edema   . Obesity, unspecified   . Chest pain, unspecified   . Pneumonitis due to inhalation of food or vomitus   . Other convulsions   . Cellulitis and abscess of upper arm and forearm   . Chronic hepatitis C without mention of hepatic coma   . Hypopotassemia   . Anemia, unspecified   . Other and unspecified coagulation defects   . Thrombocytopenia, unspecified   . Bipolar I disorder, most recent episode (or current) unspecified   . Unspecified psychosis   . Encephalopathy   . Esophageal varices without mention of bleeding   . Esophagitis, unspecified   . Abscess of liver(572.0)   . Chronic kidney disease, unspecified   . Lumbago   . Personal history of alcoholism   . Personal history of tobacco use, presenting hazards to health   . Cirrhosis of liver without mention of alcohol   . Pneumonitis due to inhalation of food or vomitus 09/19/2010  . Chronic hepatitis C without mention of hepatic coma   . Bipolar I disorder, most recent episode (or current) unspecified   . Unspecified psychosis   . Other ascites   . Personal history of tobacco use, presenting hazards to health   . Cirrhosis of liver  without mention of alcohol   . Type 2 diabetes mellitus with diabetic neuropathy    Past Surgical History  Procedure Laterality Date  . Colostomy    . Cholecystectomy    . Small intestine surgery    . Arm surgery      left arm  . US guided drain placement in ventral hernia abscess  07/21/2010  . Multiple picc line placements    . Esophagogastroduodenoscopy  06/28/2011    Procedure: ESOPHAGOGASTRODUODENOSCOPY (EGD);  Surgeon: Inda Castle, MD;  Location: [facility];  Service: Endoscopy;  Laterality: N/A;  . Esophagogastroduodenoscopy N/A 05/06/2012    Procedure: ESOPHAGOGASTRODUODENOSCOPY (EGD);  Surgeon: Inda Castle, MD;  Location: [facility];  Service: Endoscopy;  Laterality: N/A;  . Esophageal banding N/A 05/06/2012     Procedure: ESOPHAGEAL BANDING;  Surgeon: Inda Castle, MD;  Location: [facility];  Service: Endoscopy;  Laterality: N/A;   Family History  Problem Relation Age of Onset  . Heart disease Mother     MI  . Lung cancer Brother    History  Substance Use Topics  . Smoking status: Former Smoker    Types: Cigarettes    Quit date: 02/15/2008  . Smokeless tobacco: Never Used  . Alcohol Use: No     Comment: Quit drinking 1 year ago.      Review of Systems  Constitutional: Positive for activity change.  Gastrointestinal: Positive for nausea, vomiting (coffee-ground appearance), abdominal pain and abdominal distention.  Skin: Positive for color change.  Neurological: Positive for dizziness.  Psychiatric/Behavioral: Positive for confusion.  All other systems reviewed and are negative.      Allergies  Droperidol; Penicillins; and Toradol  Home Medications   Current Outpatient Rx  Name  Route  Sig  Dispense  Refill  . bisacodyl (DULCOLAX) 10 MG suppository   Rectal   Place 10 mg rectally as needed for moderate constipation (constipation).         . ferrous sulfate 325 (65 FE) MG tablet   Oral   Take 1 tablet (325 mg total) by mouth at bedtime.   30 tablet   0   . folic acid (FOLVITE) 1 MG tablet   Oral   Take 1 tablet (1 mg total) by mouth every morning.   30 tablet   1   . furosemide (LASIX) 40 MG tablet   Oral   Take 1 tablet (40 mg total) by mouth 2 (two) times daily.   60 tablet   1   . insulin aspart (NOVOLOG) 100 UNIT/ML injection   Subcutaneous   Inject 5 Units into the skin 3 (three) times daily before meals.         . insulin glargine (LANTUS) 100 UNIT/ML injection   Subcutaneous   Inject 0.12 mLs (12 Units total) into the skin at bedtime.   10 mL   1   . lactulose (CHRONULAC) 10 GM/15ML solution   Oral   Take 67.5 mLs (45 g total) by mouth 3 (three) times daily. Can take upto 5 times a day, titrate for 2-3 soft stools per day   240 mL   1    . levETIRAcetam (KEPPRA) 500 MG tablet   Oral   Take 2 tablets (1,000 mg total) by mouth 2 (two) times daily.   180 tablet   1   . levothyroxine (SYNTHROID, LEVOTHROID) 75 MCG tablet   Oral   Take 1 tablet (75 mcg total) by mouth every morning.   30 tablet  0   . Multiple Vitamin (MULTIVITAMIN WITH MINERALS) TABS tablet   Oral   Take 1 tablet by mouth every morning.   30 tablet   1   . nadolol (CORGARD) 40 MG tablet   Oral   Take 1 tablet (40 mg total) by mouth every morning.   90 tablet   1   . omeprazole (PRILOSEC) 20 MG capsule   Oral   Take 1 capsule (20 mg total) by mouth daily.   30 capsule   1   . ondansetron (ZOFRAN) 4 MG tablet   Oral   Take 4 mg by mouth every 8 (eight) hours as needed for nausea or vomiting.         [name] oxyCODONE (OXY IR/ROXICODONE) 5 MG immediate release tablet   Oral   Take 1 tablet (5 mg total) by mouth every 6 (six) hours as needed for severe pain.   30 tablet   0   . PARoxetine (PAXIL) 20 MG tablet   Oral   Take 20 mg by mouth daily.         . potassium chloride SA (K-DUR,KLOR-CON) 20 MEQ tablet   Oral   Take 20 mEq by mouth 2 (two) times daily.         . rifaximin (XIFAXAN) 550 MG TABS tablet   Oral   Take 1 tablet (550 mg total) by mouth 2 (two) times daily.   60 tablet   1   . risperiDONE (RISPERDAL) 0.25 MG tablet   Oral   Take 0.25 mg by mouth 2 (two) times daily.         [name] spironolactone (ALDACTONE) 100 MG tablet   Oral   Take 2 tablets (200 mg total) by mouth every morning.   60 tablet   1    BP 110/59  Pulse 76  Temp(Src) 97.7 F (36.5 C) (Oral)  Resp 14  SpO2 100% Physical Exam  Nursing note and vitals reviewed. Constitutional: He is oriented to person, place, and time. He appears well-developed and well-nourished. No distress.  Unkempt.  HENT:  Head: Normocephalic and atraumatic.  Mouth/Throat: Oropharynx is clear and moist.  Eyes: Conjunctivae and EOM are normal. Pupils are equal, round,  and reactive to light.  Neck: Normal range of motion. Neck supple.  Cardiovascular: Normal rate, regular rhythm and normal heart sounds.   Pulmonary/Chest: Effort normal and breath sounds normal.  Abdominal: Soft. Bowel sounds are normal. He exhibits distension. There is generalized tenderness. There is no rigidity, no rebound and no guarding.  No peritoneal signs.  Musculoskeletal: Normal range of motion. He exhibits no edema.  Neurological: He is alert and oriented to person, place, and time. He has normal strength. No sensory deficit. He displays a negative Romberg sign. Coordination normal. GCS eye subscore is 4. GCS verbal subscore is 5. GCS motor subscore is 6.  Skin: Skin is warm and dry. He is not diaphoretic.  Jaundiced. Multiple bruises to bilateral arms, non-tender.  Psychiatric: He has a normal mood and affect. His behavior is normal.    ED Course  Procedures (including critical care time) Labs Review Labs Reviewed  CBC - Abnormal; Notable for the following:    RBC 3.13 (*)    Hemoglobin 11.1 (*)    HCT 30.2 (*)    MCH 35.5 (*)    MCHC 36.8 (*)    Platelets 51 (*)    All other components within normal limits  COMPREHENSIVE METABOLIC PANEL - Abnormal; Notable for  the following:    Sodium 118 (*)    Chloride 86 (*)    Glucose, Bld 206 (*)    Calcium 7.7 (*)    Albumin 2.5 (*)    AST 49 (*)    Alkaline Phosphatase 175 (*)    Total Bilirubin 2.7 (*)    All other components within normal limits  LIPASE, BLOOD - Abnormal; Notable for the following:    Lipase 88 (*)    All other components within normal limits  AMMONIA - Abnormal; Notable for the following:    Ammonia 85 (*)    All other components within normal limits  PROTIME-INR - Abnormal; Notable for the following:    Prothrombin Time 22.2 (*)    INR 2.02 (*)    All other components within normal limits  PREPARE FRESH FROZEN PLASMA  TYPE AND SCREEN   Imaging Review No results found.  EKG Interpretation    None       MDM   Final diagnoses:  Hematemesis  Hyponatremia   Pt presenting with hematemesis. He appears in NAD, hemodynamically stable. Labs pending. Plan to admit. Pt will need EGD. Case discussed with attending Dr Venora Maples who agrees with plan of care.  Labs showing hyponatremia of 118, elevated ammonia at 85. Patient 2 L fluid bolus. I spoke with Dr. Penelope Coop, GI on call who will evaluate patient was admitted. Patient will be admitted to the hospitalist, admission accepted by Dr. Daleen Bo, [facility].  Illene Labrador, PA-C 04/15/13 1514

## 2013-04-15 NOTE — Consult Note (Signed)
[facility] Consult: 4:31 PM 04/15/2013  LOS: 0 days    Referring Provider: Dr Daleen Bo Primary Care Physician:  Hollace Kinnier, DO Primary Gastroenterologist:  Dr. Deatra Ina    Reason for Consultation:  Dark emesis   HPI: Tony Boyd is a 58 y.o. male. Has Hep C, cirrhosis,  Alcoholism (last ETOH 2/17), seizures, anemia, thrombocytopenia, DM , hypothyroidism. EGDs in 2013 and 2014 showed non bleeding grade 3 varices, these were not treated with endoscopic therapy.  Is supposed to be taking Nadolol, which he appears to comply with. nETOH use of 40 oz beer per day.      Admitted to hospital 2/9 - 2/13 with seizures (ran out of Keppra), hepatic encephalopathy (ammonia 69).  He says he had EGD and dark emesis then but records do not support this, was never seen by GI.   Presents to ED with Hematemesis associated with dizziness, presyncope. 2 episodes of dark emesis that had tinge of red, last night, one this AM.  Stools dark yest and this AM.  Hgb is 11.1 (11.3 on 04/10/13), platelets 51.  coags 22/2.0. BUN not elevated.   + increase abd girth and at least 25# weight gain in last several weeks.  Evicted from his residence during recent hospital stay, living at [facility] since discharge.  Dtr manages pts meds and affairs. Seizure last PM, despite taking his Keppra.     Past Medical History  Diagnosis Date  . Cirrhosis of liver   . Hepatitis C   . ETOH abuse   . Hypothyroid   . Psychosis   . Bipolar disorder   . Seizures   . Arthritis   . Coagulopathy     Hx of  . Thrombocytopenia     Hx of  . Pancytopenia     Hx of  . Hyponatremia     Hx of  . Korsakoff psychosis   . H/O abdominal abscess   . H/O esophageal varices   . Metabolic encephalopathy   . Hepatic encephalopathy   . H/O renal failure   . H/O  ascites   . Anemia   . Ascites   . Shortness of breath   . Peripheral vascular disease   . GERD (gastroesophageal reflux disease)   . Unspecified hypothyroidism 06/02/2012  . Edema   . Obesity, unspecified   . Cellulitis and abscess of upper arm and forearm   . Abscess of liver(572.0)   . Chronic kidney disease, unspecified   . Type 2 diabetes mellitus with diabetic neuropathy     Past Surgical History  Procedure Laterality Date  . Colostomy    . Cholecystectomy    . Small intestine surgery    . Arm surgery      left arm  . US guided drain placement in ventral hernia abscess  07/21/2010  . Multiple picc line placements    . Esophagogastroduodenoscopy  06/28/2011    Procedure: ESOPHAGOGASTRODUODENOSCOPY (EGD);  Surgeon: Inda Castle, MD;  Location: [facility];  Service: Endoscopy;  Laterality: N/A;  . Esophagogastroduodenoscopy N/A 05/06/2012  Procedure: ESOPHAGOGASTRODUODENOSCOPY (EGD);  Surgeon: Javaya Oregon D Gailya Tauer, MD;  Location: [facility];  Service: Endoscopy;  Laterality: N/A;  . Esophageal banding N/A 05/06/2012    Procedure: ESOPHAGEAL BANDING;  Surgeon: Edgard Debord D Gerhardt Gleed, MD;  Location: [facility];  Service: Endoscopy;  Laterality: N/A;    Prior to Admission medications   Medication Sig Start Date End Date Taking? Authorizing Provider  bisacodyl (DULCOLAX) 10 MG suppository Place 10 mg rectally as needed for moderate constipation (constipation).   Yes Historical Provider, MD  ferrous sulfate 325 (65 FE) MG tablet Take 1 tablet (325 mg total) by mouth at bedtime. 04/11/13  Yes Costin M Gherghe, MD  folic acid (FOLVITE) 1 MG tablet Take 1 tablet (1 mg total) by mouth every morning. 04/11/13  Yes Costin M Gherghe, MD  furosemide (LASIX) 40 MG tablet Take 1 tablet (40 mg total) by mouth 2 (two) times daily. 04/11/13  Yes Costin M Gherghe, MD  insulin aspart (NOVOLOG) 100 UNIT/ML injection Inject 5 Units into the skin 3 (three) times daily before meals.   Yes Historical Provider,  MD  insulin glargine (LANTUS) 100 UNIT/ML injection Inject 0.12 mLs (12 Units total) into the skin at bedtime. 04/11/13  Yes Costin M Gherghe, MD  lactulose (CHRONULAC) 10 GM/15ML solution Take 67.5 mLs (45 g total) by mouth 3 (three) times daily. Can take upto 5 times a day, titrate for 2-3 soft stools per day 04/11/13  Yes Costin M Gherghe, MD  levETIRAcetam (KEPPRA) 500 MG tablet Take 2 tablets (1,000 mg total) by mouth 2 (two) times daily. 04/11/13  Yes Costin M Gherghe, MD  levothyroxine (SYNTHROID, LEVOTHROID) 75 MCG tablet Take 1 tablet (75 mcg total) by mouth every morning. 04/11/13  Yes Costin M Gherghe, MD  Multiple Vitamin (MULTIVITAMIN WITH MINERALS) TABS tablet Take 1 tablet by mouth every morning. 04/11/13  Yes Costin M Gherghe, MD  nadolol (CORGARD) 40 MG tablet Take 1 tablet (40 mg total) by mouth every morning. 04/11/13  Yes Costin M Gherghe, MD  omeprazole (PRILOSEC) 20 MG capsule Take 1 capsule (20 mg total) by mouth daily. 04/11/13  Yes Costin M Gherghe, MD  ondansetron (ZOFRAN) 4 MG tablet Take 4 mg by mouth every 8 (eight) hours as needed for nausea or vomiting.   Yes Historical Provider, MD  oxyCODONE (OXY IR/ROXICODONE) 5 MG immediate release tablet Take 1 tablet (5 mg total) by mouth every 6 (six) hours as needed for severe pain. 04/11/13  Yes Costin M Gherghe, MD  PARoxetine (PAXIL) 20 MG tablet Take 20 mg by mouth daily.   Yes Historical Provider, MD  potassium chloride SA (K-DUR,KLOR-CON) 20 MEQ tablet Take 20 mEq by mouth 2 (two) times daily.   Yes Historical Provider, MD  rifaximin (XIFAXAN) 550 MG TABS tablet Take 1 tablet (550 mg total) by mouth 2 (two) times daily. 04/11/13  Yes Costin M Gherghe, MD  risperiDONE (RISPERDAL) 0.25 MG tablet Take 0.25 mg by mouth 2 (two) times daily.   Yes Historical Provider, MD  spironolactone (ALDACTONE) 100 MG tablet Take 2 tablets (200 mg total) by mouth every morning. 04/11/13  Yes Costin M Gherghe, MD    Scheduled Meds: . folic acid  1 mg  Oral Daily  . insulin aspart  0-9 Units Subcutaneous 6 times per day  . lactulose  45 g Oral TID  . levETIRAcetam  1,000 mg Oral BID  . [START ON 04/16/2013] levothyroxine  75 mcg Oral QAC breakfast  . LORazepam  0-4 mg Intravenous Q6H     Followed by  . [START ON 04/17/2013] LORazepam  0-4 mg Intravenous Q12H  . multivitamin with minerals  1 tablet Oral Daily  . [START ON 04/16/2013] nadolol  40 mg Oral Daily  . octreotide  50 mcg Intravenous Once  . pantoprazole  40 mg Oral Daily  . PARoxetine  20 mg Oral Daily  . phytonadione (VITAMIN K) IV  5 mg Intravenous Once  . rifaximin  550 mg Oral BID  . risperiDONE  0.25 mg Oral BID  . sodium chloride  1,000 mL Intravenous Once  . sodium chloride  3 mL Intravenous Q12H  . thiamine  100 mg Oral Daily   Or  . thiamine  100 mg Intravenous Daily   Infusions: . sodium chloride    . octreotide (SANDOSTATIN) infusion     PRN Meds: LORazepam, LORazepam, ondansetron (ZOFRAN) IV, ondansetron, oxyCODONE   Allergies as of 04/15/2013 - Review Complete 04/15/2013  Allergen Reaction Noted  . Droperidol Other (See Comments) 09/14/2010  . Penicillins Swelling 09/14/2010  . Toradol [ketorolac tromethamine] Hives 06/06/2012    Family History  Problem Relation Age of Onset  . Heart disease Mother     MI  . Lung cancer Brother     History   Social History  . Marital Status: Single    Spouse Name: N/A    Number of Children: 2  . Years of Education: N/A   Occupational History  . Disabled.  Former Marine/construction work    Social History Main Topics  . Smoking status: Former Smoker    Types: Cigarettes    Quit date: 02/15/2008  . Smokeless tobacco: Never Used  . Alcohol Use: 0.6 oz/week    1 Cans of beer per week     Comment: Quit drinking 1 year ago.    04/15/2013   drinks two  40 oz beer   daily   . Drug Use: No  . Sexual Activity: No   Other Topics Concern  . Not on file   Social History Narrative   Resident of ALF.  States he  is widowed.  Ambulates with a walker.    REVIEW OF SYSTEMS: See HPI for results of 12 system review No blood in urine, no oliguria or dysuria. Stable LE edema and discoloration. + left sided numbness, comes and goes No blurry vision Bruised his arms after slipp/fall on ice.    PHYSICAL EXAM: Vital signs in last 24 hours: Filed Vitals:   04/15/13 1620  BP: 119/62  Pulse: 60  Temp: 97.6 F (36.4 C)  Resp: 18   Wt Readings from Last 3 Encounters:  04/11/13 98.476 kg (217 lb 1.6 oz)  04/02/13 99.156 kg (218 lb 9.6 oz)  03/05/13 120.838 kg (266 lb 6.4 oz)    General: looks unwell Head:  No trauma  Eyes:  No icterus or pallor Ears:  Not HOH  Nose:  No dscharge Mouth:  No teeth, no sores Neck:  No JVD, no mass Lungs:  Clear bil.  No cough or dyspnea.  Voice ragged Heart: RRR.  No MRG Abdomen:  Distended, ventral hernia, protuberant, soft, NT.  Active BS.   Rectal: deferred   Musc/Skeltl: no joint contracture Extremities:  + venous satsis discoloration and edema in both legs  Neurologic:  Oriented x 3.  No asterixis.  Skin:  Sores on left palm from recent fall to ground Nodes:  No cervical adenopathy   Psych:  Pleasant, cooperative, relaxed.   Intake/Output from previous day:   Intake/Output  this shift: Total I/O In: -  Out: 580 [Urine:580]  LAB RESULTS:  Recent Labs  04/15/13 1255  WBC 7.5  HGB 11.1*  HCT 30.2*  PLT 51*   BMET Lab Results  Component Value Date   NA 118* 04/15/2013   NA 128* 04/10/2013   NA 127* 04/09/2013   K 5.3 04/15/2013   K 4.1 04/10/2013   K 4.6 04/09/2013   CL 86* 04/15/2013   CL 94* 04/10/2013   CL 91* 04/09/2013   CO2 22 04/15/2013   CO2 23 04/10/2013   CO2 25 04/09/2013   GLUCOSE 206* 04/15/2013   GLUCOSE 130* 04/10/2013   GLUCOSE 210* 04/09/2013   BUN 11 04/15/2013   BUN 15 04/10/2013   BUN 14 04/09/2013   CREATININE 0.65 04/15/2013   CREATININE 0.81 04/10/2013   CREATININE 0.75 04/09/2013   CALCIUM 7.7* 04/15/2013   CALCIUM 8.4  04/10/2013   CALCIUM 8.6 04/09/2013   LFT  Recent Labs  04/15/13 1255  PROT 6.8  ALBUMIN 2.5*  AST 49*  ALT 34  ALKPHOS 175*  BILITOT 2.7*   PT/INR Lab Results  Component Value Date   INR 2.02* 04/15/2013   INR 2.09* 04/10/2013   INR 2.16* 04/08/2013   Hepatitis Panel No results found for this basename: HEPBSAG, HCVAB, HEPAIGM, HEPBIGM,  in the last 72 hours C-Diff No components found with this basename: cdiff   Lipase     Component Value Date/Time   LIPASE 88* 04/15/2013 1255    Drugs of Abuse     Component Value Date/Time   LABOPIA NONE DETECTED 04/06/2013 1350   LABOPIA  Value: POSITIVE (NOTE) Result repeated and verified. Sent for confirmatory testing* 10/31/2007 0600   COCAINSCRNUR NONE DETECTED 04/06/2013 1350   COCAINSCRNUR NEGATIVE 10/31/2007 0600   LABBENZ NONE DETECTED 04/06/2013 1350   LABBENZ  Value: POSITIVE (NOTE) Result repeated and verified. Sent for confirmatory testing* 10/31/2007 0600   AMPHETMU NONE DETECTED 04/06/2013 1350   AMPHETMU NEGATIVE 10/31/2007 0600   THCU NONE DETECTED 04/06/2013 1350   LABBARB NONE DETECTED 04/06/2013 1350     RADIOLOGY STUDIES: No results found.  ENDOSCOPIC STUDIES: 04/2012   EGD, surveillance  ENDOSCOPIC IMPRESSION:  1. Grade 3 varices  2. portal hypertensive gastropathy  RECOMMENDATIONS:  Continue nadolol 40 mg daily  REPEAT EXAM:  Endoscopy one year   06/2011 EGD For screening in a patient with portal hypertension and/or cirrhosis. ENDOSCOPIC IMPRESSION:  1) Grade III varices in the distal esophagus  2) Portal gastropathy  3) Otherwise normal examination  RECOMMENDATIONS:Begin nadolol 40mg  qd  OV 1 month  EGD 1 year   IMPRESSION:   *  Acute hematemesis in alcoholic with known varices and faulty clotting parameters. *  Cirrhosis, hep C *  Thrombocytopenia, coagulopathy *  DM 2.  *  Lipase 88.  Not clear he has pancreatitis. No abd pain and not tender *  Active alcoholism, not in remission. *  Seizure  disorder *  Recent AMS, attributed to hepatic ecephalopathy.  No current asterixis, ammonia in 80s *  DM2.     PLAN:     *  EGD when coags corrected. Clears tonight, npo after midnite.  *  Added octreotide and upped to BID oral protonix. Added cipro for cirrhotic gi bleeder who is pcn allergic.  *  FFP x 2 as ordered.   Azucena Freed  04/15/2013, 4:31 PM Pager: [phone]  GI Attending Note   Chart was reviewed and patient was examined. X-rays  and lab were reviewed.    I agree with management and plans. Hematemesis could be variceal.  Mallory-Weiss tear, active PUD are other possibilities.  Unfortunately he has end stage liver disease.  To above, would add antibiotics for SBE prophylaxis, vit K.  Plan EGD tomorrow. Sandy Salaam. Deatra Ina, M.D., [facility] Cell [phone]

## 2013-04-15 NOTE — ED Notes (Signed)
Admitting MD at bedside.

## 2013-04-15 NOTE — ED Notes (Signed)
CRITICAL VALUE ALERT  Critical value received:  NA 118   Date of notification:  04/15/2013  Time of notification:  2395  Critical value read back: Yes   Nurse who received alert:  Jettie Pagan   MD notified (1st page):  [place]

## 2013-04-15 NOTE — ED Notes (Signed)
PA at bedside.

## 2013-04-15 NOTE — ED Notes (Signed)
Pt. States "I had a seizure this morning. I don't have any more keppra left. I also need my nerve pill and pain pill". PA notified.

## 2013-04-15 NOTE — H&P (Signed)
[facility] History and Physical  Shadd Legnon [id number] DOB: 1956/01/23 DOA: 04/15/2013  Referring physician:  PCP: Hollace Kinnier, DO  Specialists:   Chief Complaint: hematemesis   HPI: Tony Boyd is a 58 y.o. male with PMH of ESLD, portal HTN,  Hep C, alcoholism, seizures, anemia, thrombocytopenia, DM , hypothyroidism presented with 3 episodes of hematemesis, and one time melena; he also reports dizziness, complicated with presyncope yesterday; he has had similar episodes in the past associated with variceal GIB, recommended to quit alcohol use, but still drinking etoh, last use was 2/17;  -denies fever, chills, no chest pain, no focal neuro symptoms   Review of Systems: The patient denies anorexia, fever, weight loss,, vision loss, decreased hearing, hoarseness, chest pain, syncope, dyspnea on exertion, peripheral edema, balance deficits, hemoptysis, abdominal pain, severe indigestion/heartburn, hematuria, incontinence, genital sores, muscle weakness, suspicious skin lesions, transient blindness, difficulty walking, depression, unusual weight change, abnormal bleeding, enlarged lymph nodes, angioedema, and breast masses.   Past Medical History  Diagnosis Date  . Cirrhosis of liver   . Hepatitis C   . ETOH abuse   . Hypothyroid   . Psychosis   . Bipolar disorder   . Seizures   . Arthritis   . Back pain   . Coagulopathy     Hx of  . Thrombocytopenia     Hx of  . Pancytopenia     Hx of  . H/O hypokalemia   . Hyponatremia     Hx of  . Korsakoff psychosis   . H/O abdominal abscess   . H/O esophageal varices   . Metabolic encephalopathy   . Hepatic encephalopathy   . Urinary urgency   . H/O renal failure   . H/O ascites   . Altered mental status   . Anemia   . Ascites   . Shortness of breath   . Peripheral vascular disease   . GERD (gastroesophageal reflux disease)   . Thrombocytopenia, acquired 06/02/2012  . Unspecified hypothyroidism 06/02/2012  . Pain in  joint, pelvic region and thigh   . Edema   . Obesity, unspecified   . Chest pain, unspecified   . Pneumonitis due to inhalation of food or vomitus   . Other convulsions   . Cellulitis and abscess of upper arm and forearm   . Chronic hepatitis C without mention of hepatic coma   . Hypopotassemia   . Anemia, unspecified   . Other and unspecified coagulation defects   . Thrombocytopenia, unspecified   . Bipolar I disorder, most recent episode (or current) unspecified   . Unspecified psychosis   . Encephalopathy   . Esophageal varices without mention of bleeding   . Esophagitis, unspecified   . Abscess of liver(572.0)   . Chronic kidney disease, unspecified   . Lumbago   . Personal history of alcoholism   . Personal history of tobacco use, presenting hazards to health   . Cirrhosis of liver without mention of alcohol   . Pneumonitis due to inhalation of food or vomitus 09/19/2010  . Chronic hepatitis C without mention of hepatic coma   . Bipolar I disorder, most recent episode (or current) unspecified   . Unspecified psychosis   . Other ascites   . Personal history of tobacco use, presenting hazards to health   . Cirrhosis of liver without mention of alcohol   . Type 2 diabetes mellitus with diabetic neuropathy    Past Surgical History  Procedure Laterality Date  . Colostomy    .  Cholecystectomy    . Small intestine surgery    . Arm surgery      left arm  . US guided drain placement in ventral hernia abscess  07/21/2010  . Multiple picc line placements    . Esophagogastroduodenoscopy  06/28/2011    Procedure: ESOPHAGOGASTRODUODENOSCOPY (EGD);  Surgeon: Inda Castle, MD;  Location: [facility];  Service: Endoscopy;  Laterality: N/A;  . Esophagogastroduodenoscopy N/A 05/06/2012    Procedure: ESOPHAGOGASTRODUODENOSCOPY (EGD);  Surgeon: Inda Castle, MD;  Location: [facility];  Service: Endoscopy;  Laterality: N/A;  . Esophageal banding N/A 05/06/2012    Procedure:  ESOPHAGEAL BANDING;  Surgeon: Inda Castle, MD;  Location: [facility];  Service: Endoscopy;  Laterality: N/A;   Social History:  reports that he quit smoking about 5 years ago. His smoking use included Cigarettes. He smoked 0.00 packs per day. He has never used smokeless tobacco. He reports that he does not drink alcohol or use illicit drugs. Home;  where does patient live--home, ALF, SNF? and with whom if at home? Yes;  Can patient participate in ADLs?  Allergies  Allergen Reactions  . Droperidol Other (See Comments)    unknown  . Penicillins Swelling  . Toradol [Ketorolac Tromethamine] Hives    Family History  Problem Relation Age of Onset  . Heart disease Mother     MI  . Lung cancer Brother     (be sure to complete)  Prior to Admission medications   Medication Sig Start Date End Date Taking? Authorizing Provider  bisacodyl (DULCOLAX) 10 MG suppository Place 10 mg rectally as needed for moderate constipation (constipation).   Yes Historical Provider, MD  ferrous sulfate 325 (65 FE) MG tablet Take 1 tablet (325 mg total) by mouth at bedtime. 04/11/13  Yes Costin Karlyne Greenspan, MD  folic acid (FOLVITE) 1 MG tablet Take 1 tablet (1 mg total) by mouth every morning. 04/11/13  Yes Costin Karlyne Greenspan, MD  furosemide (LASIX) 40 MG tablet Take 1 tablet (40 mg total) by mouth 2 (two) times daily. 04/11/13  Yes Costin Karlyne Greenspan, MD  insulin aspart (NOVOLOG) 100 UNIT/ML injection Inject 5 Units into the skin 3 (three) times daily before meals.   Yes Historical Provider, MD  insulin glargine (LANTUS) 100 UNIT/ML injection Inject 0.12 mLs (12 Units total) into the skin at bedtime. 04/11/13  Yes Costin Karlyne Greenspan, MD  lactulose (CHRONULAC) 10 GM/15ML solution Take 67.5 mLs (45 g total) by mouth 3 (three) times daily. Can take upto 5 times a day, titrate for 2-3 soft stools per day 04/11/13  Yes Costin Karlyne Greenspan, MD  levETIRAcetam (KEPPRA) 500 MG tablet Take 2 tablets (1,000 mg total) by mouth 2 (two)  times daily. 04/11/13  Yes Costin Karlyne Greenspan, MD  levothyroxine (SYNTHROID, LEVOTHROID) 75 MCG tablet Take 1 tablet (75 mcg total) by mouth every morning. 04/11/13  Yes Costin Karlyne Greenspan, MD  Multiple Vitamin (MULTIVITAMIN WITH MINERALS) TABS tablet Take 1 tablet by mouth every morning. 04/11/13  Yes Costin Karlyne Greenspan, MD  nadolol (CORGARD) 40 MG tablet Take 1 tablet (40 mg total) by mouth every morning. 04/11/13  Yes Costin Karlyne Greenspan, MD  omeprazole (PRILOSEC) 20 MG capsule Take 1 capsule (20 mg total) by mouth daily. 04/11/13  Yes Costin Karlyne Greenspan, MD  ondansetron (ZOFRAN) 4 MG tablet Take 4 mg by mouth every 8 (eight) hours as needed for nausea or vomiting.   Yes Historical Provider, MD  oxyCODONE (OXY IR/ROXICODONE) 5 MG immediate release  tablet Take 1 tablet (5 mg total) by mouth every 6 (six) hours as needed for severe pain. 04/11/13  Yes Costin Karlyne Greenspan, MD  PARoxetine (PAXIL) 20 MG tablet Take 20 mg by mouth daily.   Yes Historical Provider, MD  potassium chloride SA (K-DUR,KLOR-CON) 20 MEQ tablet Take 20 mEq by mouth 2 (two) times daily.   Yes Historical Provider, MD  rifaximin (XIFAXAN) 550 MG TABS tablet Take 1 tablet (550 mg total) by mouth 2 (two) times daily. 04/11/13  Yes Costin Karlyne Greenspan, MD  risperiDONE (RISPERDAL) 0.25 MG tablet Take 0.25 mg by mouth 2 (two) times daily.   Yes Historical Provider, MD  spironolactone (ALDACTONE) 100 MG tablet Take 2 tablets (200 mg total) by mouth every morning. 04/11/13  Yes Costin Karlyne Greenspan, MD   Physical Exam: Filed Vitals:   04/15/13 1400  BP: 127/62  Pulse: 71  Temp:   Resp: 17     General:  alert  Eyes: eom-i, perrla   ENT: no oral ulcers  Neck: supple   Cardiovascular: s1,s2 rrr  Respiratory: few crackles in LL  Abdomen: soft, nt distended, + ascites  Skin: ecchymosis   Musculoskeletal: LE edema  Psychiatric: no hallucinations   Neurologic: CN 2-12 intact, motor 5/5 BL symmetric   Labs on Admission:  Basic Metabolic  Panel:  Recent Labs Lab 04/09/13 1237 04/10/13 0422 04/15/13 1255  NA 127* 128* 118*  K 4.6 4.1 5.3  CL 91* 94* 86*  CO2 25 23 22   GLUCOSE 210* 130* 206*  BUN 14 15 11   CREATININE 0.75 0.81 0.65  CALCIUM 8.6 8.4 7.7*  MG  --  1.7  --   PHOS  --  4.2  --    Liver Function Tests:  Recent Labs Lab 04/10/13 0422 04/15/13 1255  AST 66* 49*  ALT 43 34  ALKPHOS 165* 175*  BILITOT 3.1* 2.7*  PROT 6.7 6.8  ALBUMIN 2.7* 2.5*    Recent Labs Lab 04/15/13 1255  LIPASE 88*    Recent Labs Lab 04/09/13 2043 04/10/13 1220 04/15/13 1255  AMMONIA RESULTS UNAVAILABLE DUE TO INTERFERING SUBSTANCE 46 85*   CBC:  Recent Labs Lab 04/10/13 0422 04/15/13 1255  WBC 5.8 7.5  HGB 11.3* 11.1*  HCT 32.9* 30.2*  MCV 101.2* 96.5  PLT 43* 51*   Cardiac Enzymes: No results found for this basename: CKTOTAL, CKMB, CKMBINDEX, TROPONINI,  in the last 168 hours  BNP (last 3 results)  Recent Labs  12/03/12 1435 01/11/13 2055 02/18/13 0234  PROBNP 297.7* 207.4* 985.8*   CBG:  Recent Labs Lab 04/09/13 0734 04/09/13 2249 04/10/13 0807 04/10/13 2055 04/11/13 0757  GLUCAP 107* 142* 102* 169* 92    Radiological Exams on Admission: No results found.  EKG: Independently reviewed. Not done   Assessment/Plan Principal Problem:   Hematemesis Active Problems:   ETOH abuse   Hepatitis C   Cirrhosis   Thrombocytopenia, acquired   Hypothyroidism   Diabetes mellitus type 2, uncontrolled, with complications   Ascites   GIB (gastrointestinal bleeding)  58 y.o. male with PMH of ESLD, portal HTN,  Hep C, alcoholism, seizures, anemia, thrombocytopenia, DM, hypothyroidism presented with 3 episodes of hematemesis, one time melena is admitted with GIB, Hypo Na  1. GIB likely UGIB<-- variceal with portal HTN due to ESLD; hemodynamically stable; Hg:11.1 -started IV octreotide, FFP, vit K to reverse coagulopathy; type screen/TF PRBC prn;  -ED consulted GI for possible EGD  2.  ESLD portal HTN, ascites, coagulopathy; Hep  C  -patient still actively drinks etoh; recommended to stop; diuretics on hold   3. Acute on chorionic Hypo Na likely due to ESLD+ diuretics;  -gentle IVF/NS; monitor BMP; holding diuretics (spironalactone+lasix) while NPO; resume reevalinAM  4. Blood loss anemia due to GIB, on top AoCD/liver+etoh -thrombocytopenia chronic due to ESLD+etoh;  -monitor counts; Tf prn;   5. DMHA1C-5.5 (12/2012); hold meal time insulin while NPO; ISS for now only    6. Alcoholism, seizure; still actively drinks etoh; last use 2/17;  -monitor on CIWA   GI,  if consultant consulted, please document name and whether formally or informally consulted  Code Status: full (must indicate code status--if unknown or must be presumed, indicate so) Family Communication: d/w patient, no family  (indicate person spoken with, if applicable, with phone number if by telephone) Disposition Plan: pend clinical improvement  (indicate anticipated LOS)  Time spent: >35 minutes   [place] Pager [phone]  If 7PM-7AM, please contact night-coverage [email or web address] Password [email or web address] 04/15/2013, 2:22 PM

## 2013-04-15 NOTE — ED Provider Notes (Signed)
Medical screening examination/treatment/procedure(s) were conducted as a shared visit with non-physician practitioner(s) and myself.  I personally evaluated the patient during the encounter.    Will admit for suspected variceal bleed. Gi consultation. Likely early EGD  Hoy Morn, MD 04/15/13 [phone]

## 2013-04-15 NOTE — ED Notes (Signed)
Per EMS, pt.vomited "coffee ground" x2 today with diarrhea. Generalized abd pain and dizziness for a week. ETOH abuse, last drink last night (2 16oz beers). 131/110 BP, 75 HR, 99% RA, CBG 232.

## 2013-04-15 NOTE — ED Notes (Signed)
Pt. Reports vomiting blood today x2 with bloody diarrhea today x1. Pt. Was recently seen for same. Pt. Alert and oriented x4. Pt. Abdomen distended, states "it was flat when I left the hospital".

## 2013-04-16 ENCOUNTER — Encounter (HOSPITAL_COMMUNITY)
Admission: EM | Disposition: A | Discharge status: Home Health | Payer: Self-pay | Source: Home / Self Care | Attending: Internal Medicine

## 2013-04-16 ENCOUNTER — Ambulatory Visit: Payer: Medicare Other | Admitting: Nurse Practitioner

## 2013-04-16 ENCOUNTER — Encounter (HOSPITAL_COMMUNITY): Payer: Self-pay | Admitting: Physician Assistant

## 2013-04-16 DIAGNOSIS — R4182 Altered mental status, unspecified: Secondary | ICD-10-CM

## 2013-04-16 DIAGNOSIS — G8929 Other chronic pain: Secondary | ICD-10-CM

## 2013-04-16 DIAGNOSIS — R609 Edema, unspecified: Secondary | ICD-10-CM

## 2013-04-16 DIAGNOSIS — M549 Dorsalgia, unspecified: Secondary | ICD-10-CM

## 2013-04-16 HISTORY — PX: ESOPHAGOGASTRODUODENOSCOPY: SHX5428

## 2013-04-16 LAB — GLUCOSE, CAPILLARY
Glucose-Capillary: 145 mg/dL — ABNORMAL HIGH (ref 70–99)
Glucose-Capillary: 112 mg/dL — ABNORMAL HIGH (ref 70–99)
Glucose-Capillary: 133 mg/dL — ABNORMAL HIGH (ref 70–99)
Glucose-Capillary: 124 mg/dL — ABNORMAL HIGH (ref 70–99)
Glucose-Capillary: 165 mg/dL — ABNORMAL HIGH (ref 70–99)

## 2013-04-16 LAB — CBC
HCT: 28.5 % — ABNORMAL LOW (ref 39.0–52.0)
Hemoglobin: 10.4 g/dL — ABNORMAL LOW (ref 13.0–17.0)
MCH: 35.4 pg — AB (ref 26.0–34.0)
MCHC: 36.5 g/dL — AB (ref 30.0–36.0)
MCV: 96.9 fL (ref 78.0–100.0)
PLATELETS: 34 10*3/uL — AB (ref 150–400)
RBC: 2.94 MIL/uL — AB (ref 4.22–5.81)
RDW: 14.9 % (ref 11.5–15.5)
WBC: 4.8 10*3/uL (ref 4.0–10.5)

## 2013-04-16 LAB — PROTIME-INR
INR: 2 — ABNORMAL HIGH (ref 0.00–1.49)
INR: 2.27 — ABNORMAL HIGH (ref 0.00–1.49)
PROTHROMBIN TIME: 22.1 s — AB (ref 11.6–15.2)
Prothrombin Time: 24.3 seconds — ABNORMAL HIGH (ref 11.6–15.2)

## 2013-04-16 LAB — COMPREHENSIVE METABOLIC PANEL
ALT: 34 U/L (ref 0–53)
AST: 49 U/L — ABNORMAL HIGH (ref 0–37)
Albumin: 2.5 g/dL — ABNORMAL LOW (ref 3.5–5.2)
Alkaline Phosphatase: 175 U/L — ABNORMAL HIGH (ref 39–117)
BUN: 11 mg/dL (ref 6–23)
CALCIUM: 7.7 mg/dL — AB (ref 8.4–10.5)
CO2: 22 meq/L (ref 19–32)
CREATININE: 0.65 mg/dL (ref 0.50–1.35)
Chloride: 86 mEq/L — ABNORMAL LOW (ref 96–112)
GFR calc non Af Amer: >90 mL/min (ref >90)
Glucose, Bld: 206 mg/dL — ABNORMAL HIGH (ref 70–99)
Potassium: 5.3 mEq/L (ref 3.7–5.3)
Sodium: 118 mEq/L — CL (ref 137–147)
TOTAL PROTEIN: 6.8 g/dL (ref 6.0–8.3)
Total Bilirubin: 2.7 mg/dL — ABNORMAL HIGH (ref 0.3–1.2)

## 2013-04-16 LAB — BASIC METABOLIC PANEL
BUN: 9 mg/dL (ref 6–23)
CALCIUM: 7.8 mg/dL — AB (ref 8.4–10.5)
CO2: 21 mEq/L (ref 19–32)
Chloride: 95 mEq/L — ABNORMAL LOW (ref 96–112)
Creatinine, Ser: 0.6 mg/dL (ref 0.50–1.35)
GLUCOSE: 138 mg/dL — AB (ref 70–99)
POTASSIUM: 5.3 meq/L (ref 3.7–5.3)
SODIUM: 126 meq/L — AB (ref 137–147)

## 2013-04-16 SURGERY — EGD (ESOPHAGOGASTRODUODENOSCOPY)
Anesthesia: Moderate Sedation

## 2013-04-16 MED ORDER — FENTANYL CITRATE 0.05 MG/ML IJ SOLN
INTRAMUSCULAR | Status: DC | PRN
  Administered 2013-04-16: 25 ug via INTRAVENOUS

## 2013-04-16 MED ORDER — SODIUM CHLORIDE 0.9 % IJ SOLN
10.0000 mL | INTRAMUSCULAR | Status: DC | PRN
  Administered 2013-04-17 – 2013-04-18 (×2): 10 mL

## 2013-04-16 MED ORDER — MIDAZOLAM HCL 5 MG/5ML IJ SOLN
INTRAMUSCULAR | Status: DC | PRN
  Administered 2013-04-16 (×2): 2 mg via INTRAVENOUS

## 2013-04-16 MED ORDER — DIPHENHYDRAMINE HCL 50 MG/ML IJ SOLN
INTRAMUSCULAR | Status: AC
  Filled 2013-04-16: qty 1

## 2013-04-16 MED ORDER — BUTAMBEN-TETRACAINE-BENZOCAINE 2-2-14 % EX AERO
INHALATION_SPRAY | CUTANEOUS | Status: DC | PRN
  Administered 2013-04-16: 2 via TOPICAL

## 2013-04-16 MED ORDER — FENTANYL CITRATE 0.05 MG/ML IJ SOLN
INTRAMUSCULAR | Status: AC
  Filled 2013-04-16: qty 2

## 2013-04-16 MED ORDER — MIDAZOLAM HCL 5 MG/ML IJ SOLN
INTRAMUSCULAR | Status: AC
  Filled 2013-04-16: qty 2

## 2013-04-16 NOTE — Progress Notes (Signed)
See endoscopy report.  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  Although he has esophageal varices, there are grade 3 and there was no stigmata of bleeding, whereas the mucosa in the stomach was quite friable and bled with any contact with the scope.  Recommendations #1 DC octreotide #2 continue twice a day PPI therapy and antibiotics

## 2013-04-16 NOTE — Progress Notes (Signed)
          Daily Rounding Note  04/16/2013, 2:44 PM  LOS: 1 day   SUBJECTIVE:       Staff unable to place second peripheral IV.  Got 2 FFP overnight. The most recent INR of 2 obtained after only 1 unit FFP. PICC line placement in progress.  Repeat INR to be obtained after this.  No bloody, melenic stools and no further vomiting since seen yesterday late afternoon.   OBJECTIVE:         Vital signs in last 24 hours:    Temp:  [97.6 F (36.4 C)-98.5 F (36.9 C)] 97.7 F (36.5 C) (02/19 1354) Pulse Rate:  [60-81] 61 (02/19 1354) Resp:  [14-20] 18 (02/19 1354) BP: (114-126)/(50-70) 114/50 mmHg (02/19 1354) SpO2:  [93 %-100 %] 96 % (02/19 1354) Weight:  [98.5 kg (217 lb 2.5 oz)] 98.5 kg (217 lb 2.5 oz) (02/19 0846) Last BM Date: 04/15/13  PICC line placement (sterile procedure) in progress, unable to enter room   Intake/Output from previous day: 02/18 0701 - 02/19 0700 In: 394 [Blood:394] Out: 2230 [Urine:2230]  Intake/Output this shift: Total I/O In: -  Out: 625 [Urine:625]  Lab Results:  Recent Labs  04/15/13 1255 04/15/13 1620 04/15/13 2125 04/16/13 0435  WBC 7.5  --   --  4.8  HGB 11.1* 10.8* 10.5* 10.4*  HCT 30.2* 28.9* 28.5* 28.5*  PLT 51*  --   --  34*   BMET  Recent Labs  04/15/13 1255 04/16/13 0435  NA 118* 126*  K 5.3 5.3  CL 86* 95*  CO2 22 21  GLUCOSE 206* 138*  BUN 11 9  CREATININE 0.65 0.60  CALCIUM 7.7* 7.8*   LFT  Recent Labs  04/15/13 1255  PROT 6.8  ALBUMIN 2.5*  AST 49*  ALT 34  ALKPHOS 175*  BILITOT 2.7*   PT/INR  Recent Labs  04/16/13 0435 04/16/13 1200  LABPROT 22.1* 24.3*  INR 2.00* 2.27*    ASSESMENT:   * Acute hematemesis in alcoholic with known varices and faulty clotting parameters. Has never had esophageal banding. I am unable to change this in Epic surgical history Rule out variceal bleed, rule out ulcers,  Rule  Out portal gastropathy. On BID oral PPI, gtt  Octreotide, Cipro *  Anemia, moderate, normocytic, no dramatic drop in Hgb since admission.  * Cirrhosis, hep C.  Never treated, still drinking ETOH daily  * Thrombocytopenia, coagulopathy S/p 2 units of FFP, 5 mg IV vitamin K. INR stil 2.0 * DM 2.  * Lipase 88. Not clear he has pancreatitis. No abd pain and not tender  * Active alcoholism, not in remission. On CIWA protocol.  * Seizure disorder.  Active, seizure on 04/14/13  * Recent AMS, attributed to hepatic ecephalopathy. No current asterixis, ammonia in 80s.  On Rifaxamin and Lactulose.  *  Ascites.   Attempted paracentesis 02/18/13: minor ascites, too small to tap. *  Elevated AFP. 130 09/2012, 240 01/2013.  CT scan 02/18/14: stable low attenuation area in right lobe, probably scar.  No HCCA looking mass.    PLAN   *  Transfuse additional 2 of FFP for EGD 3:30 PM today. Ok to have clears this AM.       Tony Boyd  04/16/2013, 2:43 PM Pager: [phone]

## 2013-04-16 NOTE — Progress Notes (Signed)
[facility] PROGRESS NOTE   Tony Boyd KNL:[id number] DOB: 1955-03-12 DOA: 04/15/2013 PCP: Hollace Kinnier, DO  HPI/Subjective: Denies any nausea or vomiting, denies any abdominal pain.  Assessment/Plan: Principal Problem:   Hematemesis Active Problems:   ETOH abuse   Hepatitis C   Cirrhosis   Thrombocytopenia, acquired   Hypothyroidism   Diabetes mellitus type 2, uncontrolled, with complications   Ascites   GIB (gastrointestinal bleeding)   GI bleed -Likely upper GI bleed, patient has history of variceal bleed secondary to portal hypertension. -He said this happened over the weekend, the episode of hematemesis and melena. -No vomiting yesterday or today. Started on ciprofloxacin because of bleeding and settings of chronic liver disease -Started on octreotide, FFP, vitamin K to reverse coagulopathy, INR is 2.0. -[place] [facility] called, EGD to be done today.  Anemia -Acute on chronic anemia, has chronic anemia secondary to liver disease, exacerbated by acute blood loss. -His bleeding is complicated by coagulopathy and thrombocytopenia because of ESLD. -Has mild decrease in hemoglobin. Baseline hemoglobin is 11.0-11.5, patient present with hemoglobin of 10.5.  End stage liver disease -With portal hypertension, ascites coagulopathy, hypoalbuminemia and hyponatremia. -This is secondary to chronic hepatitis C and alcoholism. -Patient still drinks alcohol, counseled, on diuretics hold.  Acute on chronic hyponatremia -Patient has chronic hyponatremia secondary to chronic liver disease. -Baseline about 128/30, presented with sodium of 118. -Diuretics held, given IV fluids, sodium back to around baseline. I'll stop IV fluids after the EGD.   Alcoholism -With history of seizure unclear if it's secondary to withdrawal or primary seizure. Still actively drink alcohol. -Reported to me last time he drank was on his birthday about 11 days ago.   Diabetes mellitus  type 2  -Hemoglobin A1c of 5.5 about 3 months ago, check hemoglobin A1c. -A1c of 5.5 indicates good glycemic control.  Code Status: Full Family Communication: Plan discussed with the patient. Disposition Plan: Remains inpatient   Consultants:  [facility]  Procedures:  For EGD today  Antibiotics:  Cipro   Objective: Filed Vitals:   04/16/13 0645  BP: 121/64  Pulse: 77  Temp: 98.5 F (36.9 C)  Resp: 18    Intake/Output Summary (Last 24 hours) at 04/16/13 1102 Last data filed at 04/16/13 0900  Gross per 24 hour  Intake    394 ml  Output   2455 ml  Net  -2061 ml   Filed Weights   04/16/13 0846  Weight: 98.5 kg (217 lb 2.5 oz)    Exam: General: Alert and awake, oriented x3, not in any acute distress. HEENT: anicteric sclera, pupils reactive to light and accommodation, EOMI CVS: S1-S2 clear, no murmur rubs or gallops Chest: clear to auscultation bilaterally, no wheezing, rales or rhonchi Abdomen: soft nontender, nondistended, normal bowel sounds, no organomegaly Extremities: no cyanosis, clubbing or edema noted bilaterally Neuro: Cranial nerves II-XII intact, no focal neurological deficits  Data Reviewed: Basic Metabolic Panel:  Recent Labs Lab 04/09/13 1237 04/10/13 0422 04/15/13 1255 04/16/13 0435  NA 127* 128* 118* 126*  K 4.6 4.1 5.3 5.3  CL 91* 94* 86* 95*  CO2 25 23 22 21   GLUCOSE 210* 130* 206* 138*  BUN 14 15 11 9   CREATININE 0.75 0.81 0.65 0.60  CALCIUM 8.6 8.4 7.7* 7.8*  MG  --  1.7  --   --   PHOS  --  4.2  --   --    Liver Function Tests:  Recent Labs Lab 04/10/13 0422 04/15/13 1255  AST 66*  49*  ALT 43 34  ALKPHOS 165* 175*  BILITOT 3.1* 2.7*  PROT 6.7 6.8  ALBUMIN 2.7* 2.5*    Recent Labs Lab 04/15/13 1255  LIPASE 88*    Recent Labs Lab 04/09/13 2043 04/10/13 1220 04/15/13 1255  AMMONIA RESULTS UNAVAILABLE DUE TO INTERFERING SUBSTANCE 46 85*   CBC:  Recent Labs Lab 04/10/13 0422 04/15/13 1255  04/15/13 1620 04/15/13 2125 04/16/13 0435  WBC 5.8 7.5  --   --  4.8  HGB 11.3* 11.1* 10.8* 10.5* 10.4*  HCT 32.9* 30.2* 28.9* 28.5* 28.5*  MCV 101.2* 96.5  --   --  96.9  PLT 43* 51*  --   --  34*   Cardiac Enzymes: No results found for this basename: CKTOTAL, CKMB, CKMBINDEX, TROPONINI,  in the last 168 hours BNP (last 3 results)  Recent Labs  12/03/12 1435 01/11/13 2055 02/18/13 0234  PROBNP 297.7* 207.4* 985.8*   CBG:  Recent Labs Lab 04/15/13 1629 04/15/13 2104 04/15/13 2337 04/16/13 0404 04/16/13 0749  GLUCAP 149* 146* 131* 145* 112*    Micro No results found for this or any previous visit (from the past 240 hour(s)).   Studies: No results found.  Scheduled Meds: . ciprofloxacin  400 mg Intravenous Q12H  . folic acid  1 mg Oral Daily  . insulin aspart  0-9 Units Subcutaneous 6 times per day  . lactulose  45 g Oral TID  . levETIRAcetam  1,000 mg Oral BID  . levothyroxine  75 mcg Oral QAC breakfast  . LORazepam  0-4 mg Intravenous Q6H   Followed by  . [START ON 04/17/2013] LORazepam  0-4 mg Intravenous Q12H  . multivitamin with minerals  1 tablet Oral Daily  . nadolol  40 mg Oral Daily  . pantoprazole  40 mg Oral BID  . PARoxetine  20 mg Oral Daily  . phytonadione (VITAMIN K) IV  5 mg Intravenous Daily  . rifaximin  550 mg Oral BID  . risperiDONE  0.25 mg Oral BID  . sodium chloride  3 mL Intravenous Q12H  . thiamine  100 mg Oral Daily   Or  . thiamine  100 mg Intravenous Daily   Continuous Infusions: . sodium chloride 75 mL/hr at 04/15/13 2226  . octreotide (SANDOSTATIN) infusion 25 mcg/hr (04/16/13 0135)       Time spent: 35 minutes    [facility]  [facility] Pager [phone] If 7PM-7AM, please contact night-coverage at [email or web address], password [facility] 04/16/2013, 11:02 AM  LOS: 1 day

## 2013-04-16 NOTE — Progress Notes (Signed)
Utilization review completed. Tage Feggins, RN, BSN. 

## 2013-04-16 NOTE — Interval H&P Note (Signed)
History and Physical Interval Note:  04/16/2013 4:36 PM  Tony Boyd  has presented today for surgery, with the diagnosis of UGI bleed  The various methods of treatment have been discussed with the patient and family. After consideration of risks, benefits and other options for treatment, the patient has consented to  Procedure(s): ESOPHAGOGASTRODUODENOSCOPY (EGD) (N/A) as a surgical intervention .  The patient's history has been reviewed, patient examined, no change in status, stable for surgery.  I have reviewed the patient's chart and labs.  Questions were answered to the patient's satisfaction.    The recent H&P (dated *04/15/13**) was reviewed, the patient was examined and there is no change in the patients condition since that H&P was completed.   Erskine Emery  04/16/2013, 4:36 PM   Erskine Emery

## 2013-04-16 NOTE — Progress Notes (Signed)
Unable to obtain second peripheral IV site after two IV team nurses assessed. Pt is now receiving second unit of FFP via 22 g in right chest. Pt received loading dose of Sandostatin, vitamin K, and first dose Cipro before beginning FFP. Will resume Sandostatin infusion as well as second dose of Cipro when FFP is complete. Pt is going to receive PICC line today.   Prescilla Sours, Therapist, sports

## 2013-04-16 NOTE — Progress Notes (Signed)
Peripherally Inserted Central Catheter/Midline Placement  The IV Nurse has discussed with the patient and/or persons authorized to consent for the patient, the purpose of this procedure and the potential benefits and risks involved with this procedure.  The benefits include less needle sticks, lab draws from the catheter and patient may be discharged home with the catheter.  Risks include, but not limited to, infection, bleeding, blood clot (thrombus formation), and puncture of an artery; nerve damage and irregular heat beat.  Alternatives to this procedure were also discussed.  PICC/Midline Placement Documentation        Tony Boyd 04/16/2013, 10:55 AM

## 2013-04-16 NOTE — Op Note (Signed)
[facility], [id number]   ENDOSCOPY PROCEDURE REPORT  PATIENT: Tony Boyd, Tony Boyd  MR#: [id number] BIRTHDATE: January 16, 1956 , 58  yrs. old GENDER: Male ENDOSCOPIST: Inda Castle, MD REFERRED BY: PROCEDURE DATE:  04/16/2013 PROCEDURE:  EGD, diagnostic ASA CLASS:     Class III INDICATIONS: MEDICATIONS: These medications were titrated to patient response per physician's verbal order, Versed 4 mg IV, and Fentanyl 25 mcg IV TOPICAL ANESTHETIC: Cetacaine Spray  DESCRIPTION OF PROCEDURE: After the risks benefits and alternatives of the procedure were thoroughly explained, informed consent was obtained.  The Pentax Gastroscope [id number] endoscope was introduced through the mouth and advanced to the third portion of the duodenum. Without limitations.  The instrument was slowly withdrawn as the mucosa was fully examined.      In the distal third of the esophagus there were 2 columns grade 3 varices.  There was no fresh or old blood. In the gastric fundus and body there were areas of submucosal hemorrhage and friability consistent with portal hypertensive gastropathy.  Minimal bleeding occurred with any contact of the scope onto the mucosal lining of the stomach.   The remainder of the upper endoscopy exam was otherwise normal.  Retroflexed views revealed no abnormalities.     The scope was then withdrawn from the patient and the procedure completed.  COMPLICATIONS: There were no complications. ENDOSCOPIC IMPRESSION: 1.   grade 3 esophageal varices 2.  portal hypertensive gastropathy  Source of GI bleeding was probably portal hypertensive gastropathy. Because it is very friable.  There is no stigmata of bleeding from the esophageal varices.  RECOMMENDATIONS: 1.  discontinue octreotide 2.  continue twice a day PPI therapy REPEAT EXAM:  eSigned:  Inda Castle, MD 04/16/2013 4:53 PM   CC:  PATIENT NAME:  Tony Boyd, Tony Boyd MR#:  [id number]

## 2013-04-16 NOTE — H&P (View-Only) (Signed)
[facility] Consult: 4:31 PM 04/15/2013  LOS: 0 days    Referring Provider: Dr Daleen Bo Primary Care Physician:  Hollace Kinnier, DO Primary Gastroenterologist:  Dr. Deatra Ina    Reason for Consultation:  Dark emesis   HPI: Tony Boyd is a 58 y.o. male. Has Hep C, cirrhosis,  Alcoholism (last ETOH 2/17), seizures, anemia, thrombocytopenia, DM , hypothyroidism. EGDs in 2013 and 2014 showed non bleeding grade 3 varices, these were not treated with endoscopic therapy.  Is supposed to be taking Nadolol, which he appears to comply with. nETOH use of 40 oz beer per day.      Admitted to hospital 2/9 - 2/13 with seizures (ran out of Keppra), hepatic encephalopathy (ammonia 69).  He says he had EGD and dark emesis then but records do not support this, was never seen by GI.   Presents to ED with Hematemesis associated with dizziness, presyncope. 2 episodes of dark emesis that had tinge of red, last night, one this AM.  Stools dark yest and this AM.  Hgb is 11.1 (11.3 on 04/10/13), platelets 51.  coags 22/2.0. BUN not elevated.   + increase abd girth and at least 25# weight gain in last several weeks.  Evicted from his residence during recent hospital stay, living at [facility] since discharge.  Dtr manages pts meds and affairs. Seizure last PM, despite taking his Keppra.     Past Medical History  Diagnosis Date  . Cirrhosis of liver   . Hepatitis C   . ETOH abuse   . Hypothyroid   . Psychosis   . Bipolar disorder   . Seizures   . Arthritis   . Coagulopathy     Hx of  . Thrombocytopenia     Hx of  . Pancytopenia     Hx of  . Hyponatremia     Hx of  . Korsakoff psychosis   . H/O abdominal abscess   . H/O esophageal varices   . Metabolic encephalopathy   . Hepatic encephalopathy   . H/O renal failure   . H/O  ascites   . Anemia   . Ascites   . Shortness of breath   . Peripheral vascular disease   . GERD (gastroesophageal reflux disease)   . Unspecified hypothyroidism 06/02/2012  . Edema   . Obesity, unspecified   . Cellulitis and abscess of upper arm and forearm   . Abscess of liver(572.0)   . Chronic kidney disease, unspecified   . Type 2 diabetes mellitus with diabetic neuropathy     Past Surgical History  Procedure Laterality Date  . Colostomy    . Cholecystectomy    . Small intestine surgery    . Arm surgery      left arm  . US guided drain placement in ventral hernia abscess  07/21/2010  . Multiple picc line placements    . Esophagogastroduodenoscopy  06/28/2011    Procedure: ESOPHAGOGASTRODUODENOSCOPY (EGD);  Surgeon: Inda Castle, MD;  Location: [facility];  Service: Endoscopy;  Laterality: N/A;  . Esophagogastroduodenoscopy N/A 05/06/2012  Procedure: ESOPHAGOGASTRODUODENOSCOPY (EGD);  Surgeon: Inda Castle, MD;  Location: [facility];  Service: Endoscopy;  Laterality: N/A;  . Esophageal banding N/A 05/06/2012    Procedure: ESOPHAGEAL BANDING;  Surgeon: Inda Castle, MD;  Location: [facility];  Service: Endoscopy;  Laterality: N/A;    Prior to Admission medications   Medication Sig Start Date End Date Taking? Authorizing Provider  bisacodyl (DULCOLAX) 10 MG suppository Place 10 mg rectally as needed for moderate constipation (constipation).   Yes Historical Provider, MD  ferrous sulfate 325 (65 FE) MG tablet Take 1 tablet (325 mg total) by mouth at bedtime. 04/11/13  Yes Costin Karlyne Greenspan, MD  folic acid (FOLVITE) 1 MG tablet Take 1 tablet (1 mg total) by mouth every morning. 04/11/13  Yes Costin Karlyne Greenspan, MD  furosemide (LASIX) 40 MG tablet Take 1 tablet (40 mg total) by mouth 2 (two) times daily. 04/11/13  Yes Costin Karlyne Greenspan, MD  insulin aspart (NOVOLOG) 100 UNIT/ML injection Inject 5 Units into the skin 3 (three) times daily before meals.   Yes Historical Provider,  MD  insulin glargine (LANTUS) 100 UNIT/ML injection Inject 0.12 mLs (12 Units total) into the skin at bedtime. 04/11/13  Yes Costin Karlyne Greenspan, MD  lactulose (CHRONULAC) 10 GM/15ML solution Take 67.5 mLs (45 g total) by mouth 3 (three) times daily. Can take upto 5 times a day, titrate for 2-3 soft stools per day 04/11/13  Yes Costin Karlyne Greenspan, MD  levETIRAcetam (KEPPRA) 500 MG tablet Take 2 tablets (1,000 mg total) by mouth 2 (two) times daily. 04/11/13  Yes Costin Karlyne Greenspan, MD  levothyroxine (SYNTHROID, LEVOTHROID) 75 MCG tablet Take 1 tablet (75 mcg total) by mouth every morning. 04/11/13  Yes Costin Karlyne Greenspan, MD  Multiple Vitamin (MULTIVITAMIN WITH MINERALS) TABS tablet Take 1 tablet by mouth every morning. 04/11/13  Yes Costin Karlyne Greenspan, MD  nadolol (CORGARD) 40 MG tablet Take 1 tablet (40 mg total) by mouth every morning. 04/11/13  Yes Costin Karlyne Greenspan, MD  omeprazole (PRILOSEC) 20 MG capsule Take 1 capsule (20 mg total) by mouth daily. 04/11/13  Yes Costin Karlyne Greenspan, MD  ondansetron (ZOFRAN) 4 MG tablet Take 4 mg by mouth every 8 (eight) hours as needed for nausea or vomiting.   Yes Historical Provider, MD  oxyCODONE (OXY IR/ROXICODONE) 5 MG immediate release tablet Take 1 tablet (5 mg total) by mouth every 6 (six) hours as needed for severe pain. 04/11/13  Yes Costin Karlyne Greenspan, MD  PARoxetine (PAXIL) 20 MG tablet Take 20 mg by mouth daily.   Yes Historical Provider, MD  potassium chloride SA (K-DUR,KLOR-CON) 20 MEQ tablet Take 20 mEq by mouth 2 (two) times daily.   Yes Historical Provider, MD  rifaximin (XIFAXAN) 550 MG TABS tablet Take 1 tablet (550 mg total) by mouth 2 (two) times daily. 04/11/13  Yes Costin Karlyne Greenspan, MD  risperiDONE (RISPERDAL) 0.25 MG tablet Take 0.25 mg by mouth 2 (two) times daily.   Yes Historical Provider, MD  spironolactone (ALDACTONE) 100 MG tablet Take 2 tablets (200 mg total) by mouth every morning. 04/11/13  Yes Costin Karlyne Greenspan, MD    Scheduled Meds: . folic acid  1 mg  Oral Daily  . insulin aspart  0-9 Units Subcutaneous 6 times per day  . lactulose  45 g Oral TID  . levETIRAcetam  1,000 mg Oral BID  . [START ON 04/16/2013] levothyroxine  75 mcg Oral QAC breakfast  . LORazepam  0-4 mg Intravenous Q6H  Followed by  . [START ON 04/17/2013] LORazepam  0-4 mg Intravenous Q12H  . multivitamin with minerals  1 tablet Oral Daily  . [START ON 04/16/2013] nadolol  40 mg Oral Daily  . octreotide  50 mcg Intravenous Once  . pantoprazole  40 mg Oral Daily  . PARoxetine  20 mg Oral Daily  . phytonadione (VITAMIN K) IV  5 mg Intravenous Once  . rifaximin  550 mg Oral BID  . risperiDONE  0.25 mg Oral BID  . sodium chloride  1,000 mL Intravenous Once  . sodium chloride  3 mL Intravenous Q12H  . thiamine  100 mg Oral Daily   Or  . thiamine  100 mg Intravenous Daily   Infusions: . sodium chloride    . octreotide (SANDOSTATIN) infusion     PRN Meds: LORazepam, LORazepam, ondansetron (ZOFRAN) IV, ondansetron, oxyCODONE   Allergies as of 04/15/2013 - Review Complete 04/15/2013  Allergen Reaction Noted  . Droperidol Other (See Comments) 09/14/2010  . Penicillins Swelling 09/14/2010  . Toradol [ketorolac tromethamine] Hives 06/06/2012    Family History  Problem Relation Age of Onset  . Heart disease Mother     MI  . Lung cancer Brother     History   Social History  . Marital Status: Single    Spouse Name: N/A    Number of Children: 2  . Years of Education: N/A   Occupational History  . Disabled.  Former Marine/construction work    Social History Main Topics  . Smoking status: Former Smoker    Types: Cigarettes    Quit date: 02/15/2008  . Smokeless tobacco: Never Used  . Alcohol Use: 0.6 oz/week    1 Cans of beer per week     Comment: Quit drinking 1 year ago.    04/15/2013   drinks two  40 oz beer   daily   . Drug Use: No  . Sexual Activity: No   Other Topics Concern  . Not on file   Social History Narrative   Resident of ALF.  States he  is widowed.  Ambulates with a walker.    REVIEW OF SYSTEMS: See HPI for results of 12 system review No blood in urine, no oliguria or dysuria. Stable LE edema and discoloration. + left sided numbness, comes and goes No blurry vision Bruised his arms after slipp/fall on ice.    PHYSICAL EXAM: Vital signs in last 24 hours: Filed Vitals:   04/15/13 1620  BP: 119/62  Pulse: 60  Temp: 97.6 F (36.4 C)  Resp: 18   Wt Readings from Last 3 Encounters:  04/11/13 98.476 kg (217 lb 1.6 oz)  04/02/13 99.156 kg (218 lb 9.6 oz)  03/05/13 120.838 kg (266 lb 6.4 oz)    General: looks unwell Head:  No trauma  Eyes:  No icterus or pallor Ears:  Not HOH  Nose:  No dscharge Mouth:  No teeth, no sores Neck:  No JVD, no mass Lungs:  Clear bil.  No cough or dyspnea.  Voice ragged Heart: RRR.  No MRG Abdomen:  Distended, ventral hernia, protuberant, soft, NT.  Active BS.   Rectal: deferred   Musc/Skeltl: no joint contracture Extremities:  + venous satsis discoloration and edema in both legs  Neurologic:  Oriented x 3.  No asterixis.  Skin:  Sores on left palm from recent fall to ground Nodes:  No cervical adenopathy   Psych:  Pleasant, cooperative, relaxed.   Intake/Output from previous day:   Intake/Output  this shift: Total I/O In: -  Out: 580 [Urine:580]  LAB RESULTS:  Recent Labs  04/15/13 1255  WBC 7.5  HGB 11.1*  HCT 30.2*  PLT 51*   BMET Lab Results  Component Value Date   NA 118* 04/15/2013   NA 128* 04/10/2013   NA 127* 04/09/2013   K 5.3 04/15/2013   K 4.1 04/10/2013   K 4.6 04/09/2013   CL 86* 04/15/2013   CL 94* 04/10/2013   CL 91* 04/09/2013   CO2 22 04/15/2013   CO2 23 04/10/2013   CO2 25 04/09/2013   GLUCOSE 206* 04/15/2013   GLUCOSE 130* 04/10/2013   GLUCOSE 210* 04/09/2013   BUN 11 04/15/2013   BUN 15 04/10/2013   BUN 14 04/09/2013   CREATININE 0.65 04/15/2013   CREATININE 0.81 04/10/2013   CREATININE 0.75 04/09/2013   CALCIUM 7.7* 04/15/2013   CALCIUM 8.4  04/10/2013   CALCIUM 8.6 04/09/2013   LFT  Recent Labs  04/15/13 1255  PROT 6.8  ALBUMIN 2.5*  AST 49*  ALT 34  ALKPHOS 175*  BILITOT 2.7*   PT/INR Lab Results  Component Value Date   INR 2.02* 04/15/2013   INR 2.09* 04/10/2013   INR 2.16* 04/08/2013   Hepatitis Panel No results found for this basename: HEPBSAG, HCVAB, HEPAIGM, HEPBIGM,  in the last 72 hours C-Diff No components found with this basename: cdiff   Lipase     Component Value Date/Time   LIPASE 88* 04/15/2013 1255    Drugs of Abuse     Component Value Date/Time   LABOPIA NONE DETECTED 04/06/2013 1350   LABOPIA  Value: POSITIVE (NOTE) Result repeated and verified. Sent for confirmatory testing* 10/31/2007 0600   COCAINSCRNUR NONE DETECTED 04/06/2013 1350   COCAINSCRNUR NEGATIVE 10/31/2007 0600   LABBENZ NONE DETECTED 04/06/2013 1350   LABBENZ  Value: POSITIVE (NOTE) Result repeated and verified. Sent for confirmatory testing* 10/31/2007 0600   AMPHETMU NONE DETECTED 04/06/2013 1350   AMPHETMU NEGATIVE 10/31/2007 0600   THCU NONE DETECTED 04/06/2013 1350   LABBARB NONE DETECTED 04/06/2013 1350     RADIOLOGY STUDIES: No results found.  ENDOSCOPIC STUDIES: 04/2012   EGD, surveillance  ENDOSCOPIC IMPRESSION:  1. Grade 3 varices  2. portal hypertensive gastropathy  RECOMMENDATIONS:  Continue nadolol 40 mg daily  REPEAT EXAM:  Endoscopy one year   06/2011 EGD For screening in a patient with portal hypertension and/or cirrhosis. ENDOSCOPIC IMPRESSION:  1) Grade III varices in the distal esophagus  2) Portal gastropathy  3) Otherwise normal examination  RECOMMENDATIONS:Begin nadolol 40mg  qd  OV 1 month  EGD 1 year   IMPRESSION:   *  Acute hematemesis in alcoholic with known varices and faulty clotting parameters. *  Cirrhosis, hep C *  Thrombocytopenia, coagulopathy *  DM 2.  *  Lipase 88.  Not clear he has pancreatitis. No abd pain and not tender *  Active alcoholism, not in remission. *  Seizure  disorder *  Recent AMS, attributed to hepatic ecephalopathy.  No current asterixis, ammonia in 80s *  DM2.     PLAN:     *  EGD when coags corrected. Clears tonight, npo after midnite.  *  Added octreotide and upped to BID oral protonix. Added cipro for cirrhotic gi bleeder who is pcn allergic.  *  FFP x 2 as ordered.   Azucena Freed  04/15/2013, 4:31 PM Pager: [phone]  GI Attending Note   Chart was reviewed and patient was examined. X-rays  and lab were reviewed.    I agree with management and plans. Hematemesis could be variceal.  Mallory-Weiss tear, active PUD are other possibilities.  Unfortunately he has end stage liver disease.  To above, would add antibiotics for SBE prophylaxis, vit K.  Plan EGD tomorrow. Sandy Salaam. Deatra Ina, M.D., [facility] Cell [phone]

## 2013-04-17 ENCOUNTER — Other Ambulatory Visit: Payer: Self-pay | Admitting: *Deleted

## 2013-04-17 ENCOUNTER — Encounter (HOSPITAL_COMMUNITY): Payer: Self-pay | Admitting: Gastroenterology

## 2013-04-17 ENCOUNTER — Other Ambulatory Visit: Payer: Self-pay | Admitting: Internal Medicine

## 2013-04-17 LAB — PREPARE FRESH FROZEN PLASMA
UNIT DIVISION: 0
UNIT DIVISION: 0
Unit division: 0
Unit division: 0

## 2013-04-17 LAB — CBC
HCT: 25.8 % — ABNORMAL LOW (ref 39.0–52.0)
Hemoglobin: 9 g/dL — ABNORMAL LOW (ref 13.0–17.0)
MCH: 35.4 pg — AB (ref 26.0–34.0)
MCHC: 34.9 g/dL (ref 30.0–36.0)
MCV: 101.6 fL — ABNORMAL HIGH (ref 78.0–100.0)
PLATELETS: 26 10*3/uL — AB (ref 150–400)
RBC: 2.54 MIL/uL — ABNORMAL LOW (ref 4.22–5.81)
RDW: 15.3 % (ref 11.5–15.5)
WBC: 2.5 10*3/uL — ABNORMAL LOW (ref 4.0–10.5)

## 2013-04-17 LAB — GLUCOSE, CAPILLARY
GLUCOSE-CAPILLARY: 152 mg/dL — AB (ref 70–99)
Glucose-Capillary: 126 mg/dL — ABNORMAL HIGH (ref 70–99)
Glucose-Capillary: 131 mg/dL — ABNORMAL HIGH (ref 70–99)
Glucose-Capillary: 141 mg/dL — ABNORMAL HIGH (ref 70–99)
Glucose-Capillary: 154 mg/dL — ABNORMAL HIGH (ref 70–99)

## 2013-04-17 LAB — PROTIME-INR
INR: 1.84 — ABNORMAL HIGH (ref 0.00–1.49)
PROTHROMBIN TIME: 20.7 s — AB (ref 11.6–15.2)

## 2013-04-17 MED ORDER — LACTULOSE 10 GM/15ML PO SOLN
45.0000 g | Freq: Every day | ORAL | Status: DC
  Filled 2013-04-17 (×4): qty 90

## 2013-04-17 MED ORDER — OXYCODONE HCL 5 MG PO TABS: 5.0000 mg | ORAL_TABLET | Freq: Four times a day (QID) | ORAL | Status: DC | PRN

## 2013-04-17 MED ORDER — LACTULOSE 10 GM/15ML PO SOLN
45.0000 g | Freq: Every day | ORAL | Status: DC
  Administered 2013-04-17 – 2013-04-19 (×9): 45 g via ORAL
  Filled 2013-04-17 (×14): qty 90
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
## 2013-04-17 NOTE — Progress Notes (Signed)
GI Attending Note  I have personally taken an interval history, reviewed the chart, and examined the patient.  I agree with the extender's note, impression and recommendations.  Robert D. Kaplan, MD, FACG [facility] [phone]  

## 2013-04-17 NOTE — Progress Notes (Signed)
GI Attending Note  I have personally taken an interval history, reviewed the chart, and examined the patient.  I agree with the extender's note, impression and recommendations. Continue nadolol 40mg  qd  Sandy Salaam. Deatra Ina, MD, [facility] [phone]

## 2013-04-17 NOTE — Progress Notes (Signed)
          Daily Rounding Note  04/17/2013, 8:51 AM  LOS: 2 days   SUBJECTIVE:       Feels well. No further nausea, emesis.  Last BM was PTA, normally takes Lactulose 5 times per day and has 3 to 5 BMs per day.   OBJECTIVE:         Vital signs in last 24 hours:    Temp:  [97.5 F (36.4 C)-98.5 F (36.9 C)] 97.6 F (36.4 C) (02/20 0439) Pulse Rate:  [58-78] 74 (02/20 0439) Resp:  [14-20] 16 (02/20 0439) BP: (98-126)/(37-66) 112/55 mmHg (02/20 0439) SpO2:  [96 %-100 %] 97 % (02/20 0439) Weight:  [106.142 kg (234 lb)] 106.142 kg (234 lb) (02/20 0439) Last BM Date: 04/16/13 General: looks chronically unwell.  Not acutely ill.    Heart: RRR Chest: clear bil.  No dyspnea Abdomen: soft, NT, protuberant  Extremities: + edema and venous stasis dermatitis and discoloration.  Neuro/Psych:  Pleasant.   Not confused , no asterixis.  Intake/Output from previous day: 02/19 0701 - 02/20 0700 In: 1510 [P.O.:450; I.V.:160; IV Piggyback:900] Out: 900 [Urine:900]  Intake/Output this shift:    Lab Results:  Recent Labs  04/15/13 1255  04/15/13 2125 04/16/13 0435 04/17/13 0500  WBC 7.5  --   --  4.8 2.5*  HGB 11.1*  < > 10.5* 10.4* 9.0*  HCT 30.2*  < > 28.5* 28.5* 25.8*  PLT 51*  --   --  34* 26*  < > = values in this interval not displayed. BMET  Recent Labs  04/15/13 1255 04/16/13 0435  NA 118* 126*  K 5.3 5.3  CL 86* 95*  CO2 22 21  GLUCOSE 206* 138*  BUN 11 9  CREATININE 0.65 0.60  CALCIUM 7.7* 7.8*   LFT  Recent Labs  04/15/13 1255  PROT 6.8  ALBUMIN 2.5*  AST 49*  ALT 34  ALKPHOS 175*  BILITOT 2.7*   PT/INR  Recent Labs  04/16/13 1200 04/17/13 0500  LABPROT 24.3* 20.7*  INR 2.27* 1.84*     ASSESMENT:   *  Coffee like emesis, resolved.  Felt secondary to portal gastropathy.  Pt on 40 mg Nadolol at home and currently. EGD 2/19 with non-bleeding varices.  Friable portal gastritis, oozes with touch  of scope, but no major hemorrhaging.  This is not amenable to endoscopic therapy. Day 3/5 of Cipro.  *  ABL anemia.  Hgb drop 11.1 to 9.0.  Long hx of pancytopenia and anemia.  MCV normal.  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 *  Hyponatremia, chronic, improved *  Thrombocytopenia, now critical with platelets 26   PLAN   *  Should we increase Nadolol to max dose of 80 mg q day, to further reduce portal pressures?  Pulse running generally in 60 to 70s but range of 58 to 81. I upped Lactulose to 5 x daily.  *  CBC in AM    Azucena Freed  04/17/2013, 8:51 AM Pager: [phone]

## 2013-04-17 NOTE — Progress Notes (Signed)
[facility] PROGRESS NOTE   Tony Boyd TIR:[id number] DOB: 1955/03/28 DOA: 04/15/2013 PCP: Hollace Kinnier, DO  HPI/Subjective: Denies abdominal pain, eating well without any problems. Had EGD yesterday showed significant portal gastropathy.  Assessment/Plan: Principal Problem:   Hematemesis Active Problems:   ETOH abuse   Hepatitis C   Cirrhosis   Thrombocytopenia, acquired   Hypothyroidism   Diabetes mellitus type 2, uncontrolled, with complications   Ascites   GIB (gastrointestinal bleeding)   GI bleed -Likely upper GI bleed, patient has history of variceal bleed secondary to portal hypertension. -He said this happened over the weekend, the episode of hematemesis and melena. -No vomiting yesterday or today. Started on ciprofloxacin because of bleeding and settings of chronic liver disease -Started on octreotide, FFP, vitamin K to reverse coagulopathy, INR is 2.0. -Portal hypertensive gastropathy, DC octreotide and continue the PPIs.   Anemia -Acute on chronic anemia, has chronic anemia secondary to liver disease, exacerbated by acute blood loss. -His bleeding is complicated by coagulopathy and thrombocytopenia because of ESLD. -Has mild decrease in hemoglobin. Baseline hemoglobin is 11.0-11.5, patient present with hemoglobin of 10.5.  End stage liver disease -With portal hypertension, ascites coagulopathy, hypoalbuminemia and hyponatremia. -This is secondary to chronic hepatitis C and alcoholism. -Patient still drinks alcohol, counseled, on diuretics hold.  Acute on chronic hyponatremia -Patient has chronic hyponatremia secondary to chronic liver disease. -Baseline about 128/30, presented with sodium of 118. -Diuretics held, given IV fluids, sodium back to around baseline. I'll stop IV fluids after the EGD.   Alcoholism -With history of seizure unclear if it's secondary to withdrawal or primary seizure. Still actively drink alcohol. -Reported to me last time  he drank was on his birthday about 11 days ago.   Diabetes mellitus type 2  -Hemoglobin A1c of 5.5 about 3 months ago, check hemoglobin A1c. -A1c of 5.5 indicates good glycemic control.  Code Status: Full Family Communication: Plan discussed with the patient. Disposition Plan: Remains inpatient   Consultants:  [facility]  Procedures:  For EGD today  Antibiotics:  Cipro   Objective: Filed Vitals:   04/17/13 0439  BP: 112/55  Pulse: 74  Temp: 97.6 F (36.4 C)  Resp: 16    Intake/Output Summary (Last 24 hours) at 04/17/13 1146 Last data filed at 04/17/13 0526  Gross per 24 hour  Intake   1510 ml  Output    675 ml  Net    835 ml   Filed Weights   04/16/13 0846 04/17/13 0439  Weight: 98.5 kg (217 lb 2.5 oz) 106.142 kg (234 lb)    Exam: General: Alert and awake, oriented x3, not in any acute distress. HEENT: anicteric sclera, pupils reactive to light and accommodation, EOMI CVS: S1-S2 clear, no murmur rubs or gallops Chest: clear to auscultation bilaterally, no wheezing, rales or rhonchi Abdomen: soft nontender, nondistended, normal bowel sounds, no organomegaly Extremities: no cyanosis, clubbing or edema noted bilaterally Neuro: Cranial nerves II-XII intact, no focal neurological deficits  Data Reviewed: Basic Metabolic Panel:  Recent Labs Lab 04/15/13 1255 04/16/13 0435  NA 118* 126*  K 5.3 5.3  CL 86* 95*  CO2 22 21  GLUCOSE 206* 138*  BUN 11 9  CREATININE 0.65 0.60  CALCIUM 7.7* 7.8*   Liver Function Tests:  Recent Labs Lab 04/15/13 1255  AST 49*  ALT 34  ALKPHOS 175*  BILITOT 2.7*  PROT 6.8  ALBUMIN 2.5*    Recent Labs Lab 04/15/13 1255  LIPASE 88*    Recent  Labs Lab 04/10/13 1220 04/15/13 1255  AMMONIA 46 85*   CBC:  Recent Labs Lab 04/15/13 1255 04/15/13 1620 04/15/13 2125 04/16/13 0435 04/17/13 0500  WBC 7.5  --   --  4.8 2.5*  HGB 11.1* 10.8* 10.5* 10.4* 9.0*  HCT 30.2* 28.9* 28.5* 28.5* 25.8*  MCV 96.5  --    --  96.9 101.6*  PLT 51*  --   --  34* 26*   Cardiac Enzymes: No results found for this basename: CKTOTAL, CKMB, CKMBINDEX, TROPONINI,  in the last 168 hours BNP (last 3 results)  Recent Labs  12/03/12 1435 01/11/13 2055 02/18/13 0234  PROBNP 297.7* 207.4* 985.8*   CBG:  Recent Labs Lab 04/16/13 2020 04/17/13 0020 04/17/13 0444 04/17/13 0807 04/17/13 1127  GLUCAP 165* 126* 154* 131* 152*    Micro No results found for this or any previous visit (from the past 240 hour(s)).   Studies: No results found.  Scheduled Meds: . ciprofloxacin  400 mg Intravenous Q12H  . folic acid  1 mg Oral Daily  . insulin aspart  0-9 Units Subcutaneous 6 times per day  . lactulose  45 g Oral 5 X Daily  . levETIRAcetam  1,000 mg Oral BID  . levothyroxine  75 mcg Oral QAC breakfast  . LORazepam  0-4 mg Intravenous Q6H   Followed by  . LORazepam  0-4 mg Intravenous Q12H  . multivitamin with minerals  1 tablet Oral Daily  . nadolol  40 mg Oral Daily  . pantoprazole  40 mg Oral BID  . PARoxetine  20 mg Oral Daily  . phytonadione (VITAMIN K) IV  5 mg Intravenous Daily  . rifaximin  550 mg Oral BID  . risperiDONE  0.25 mg Oral BID  . sodium chloride  3 mL Intravenous Q12H  . thiamine  100 mg Oral Daily   Or  . thiamine  100 mg Intravenous Daily   Continuous Infusions: . sodium chloride Stopped (04/17/13 0909)       Time spent: 35 minutes    [facility] A  [facility] Pager [phone] If 7PM-7AM, please contact night-coverage at [email or web address], password [facility] 04/17/2013, 11:46 AM  LOS: 2 days

## 2013-04-18 LAB — GLUCOSE, CAPILLARY
GLUCOSE-CAPILLARY: 156 mg/dL — AB (ref 70–99)
GLUCOSE-CAPILLARY: 139 mg/dL — AB (ref 70–99)
GLUCOSE-CAPILLARY: 127 mg/dL — AB (ref 70–99)
Glucose-Capillary: 114 mg/dL — ABNORMAL HIGH (ref 70–99)
Glucose-Capillary: 124 mg/dL — ABNORMAL HIGH (ref 70–99)
Glucose-Capillary: 140 mg/dL — ABNORMAL HIGH (ref 70–99)
Glucose-Capillary: 158 mg/dL — ABNORMAL HIGH (ref 70–99)

## 2013-04-18 LAB — CBC
HEMATOCRIT: 27.3 % — AB (ref 39.0–52.0)
Hemoglobin: 9.6 g/dL — ABNORMAL LOW (ref 13.0–17.0)
MCH: 35.7 pg — ABNORMAL HIGH (ref 26.0–34.0)
MCHC: 35.2 g/dL (ref 30.0–36.0)
MCV: 101.5 fL — AB (ref 78.0–100.0)
Platelets: 32 10*3/uL — ABNORMAL LOW (ref 150–400)
RBC: 2.69 MIL/uL — ABNORMAL LOW (ref 4.22–5.81)
RDW: 15.4 % (ref 11.5–15.5)
WBC: 3.4 10*3/uL — ABNORMAL LOW (ref 4.0–10.5)

## 2013-04-18 LAB — BASIC METABOLIC PANEL
BUN: 10 mg/dL (ref 6–23)
CHLORIDE: 97 meq/L (ref 96–112)
CO2: 27 mEq/L (ref 19–32)
CREATININE: 0.63 mg/dL (ref 0.50–1.35)
Calcium: 8.2 mg/dL — ABNORMAL LOW (ref 8.4–10.5)
GFR calc Af Amer: >90 mL/min (ref >90)
GFR calc non Af Amer: >90 mL/min (ref >90)
Glucose, Bld: 124 mg/dL — ABNORMAL HIGH (ref 70–99)
Potassium: 4.8 mEq/L (ref 3.7–5.3)
Sodium: 132 mEq/L — ABNORMAL LOW (ref 137–147)

## 2013-04-18 MED ORDER — OMEPRAZOLE 40 MG PO CPDR: 40.0000 mg | DELAYED_RELEASE_CAPSULE | Freq: Every day | ORAL | Status: AC

## 2013-04-18 NOTE — Progress Notes (Signed)
     Progress Note   Subjective  *No new complaints.  He had a normal colored bowel movement today**   Objective  Vital signs in last 24 hours: Temp:  [97.7 F (36.5 C)-98.7 F (37.1 C)] 97.7 F (36.5 C) (02/21 0338) Pulse Rate:  [74-76] 74 (02/21 0338) Resp:  [18-20] 20 (02/21 0338) BP: (113-124)/(53-72) 118/72 mmHg (02/21 0338) SpO2:  [93 %-96 %] 93 % (02/21 0338) Last BM Date: 04/18/13 General:   Alert,  Well-developed,  white male in NAD Heart:  Regular rate and rhythm; no murmurs Abdomen:  Soft, nontender and nondistended. Normal bowel sounds, without guarding, and without rebound.   Extremities:  Without edema. Neurologic:  Alert and  oriented x4;  grossly normal neurologically. Psych:  Alert and cooperative. Normal mood and affect.  Intake/Output from previous day: 02/20 0701 - 02/21 0700 In: 1180 [P.O.:600; I.V.:180; IV Piggyback:400] Out: 400 [Urine:400] Intake/Output this shift: Total I/O In: 360 [P.O.:360] Out: -   Lab Results:  Recent Labs  04/16/13 0435 04/17/13 0500 04/18/13 0457  WBC 4.8 2.5* 3.4*  HGB 10.4* 9.0* 9.6*  HCT 28.5* 25.8* 27.3*  PLT 34* 26* 32*   BMET  Recent Labs  04/15/13 1255 04/16/13 0435 04/18/13 0457  NA 118* 126* 132*  K 5.3 5.3 4.8  CL 86* 95* 97  CO2 22 21 27   GLUCOSE 206* 138* 124*  BUN 11 9 10   CREATININE 0.65 0.60 0.63  CALCIUM 7.7* 7.8* 8.2*   LFT  Recent Labs  04/15/13 1255  PROT 6.8  ALBUMIN 2.5*  AST 49*  ALT 34  ALKPHOS 175*  BILITOT 2.7*   PT/INR  Recent Labs  04/16/13 1200 04/17/13 0500  LABPROT 24.3* 20.7*  INR 2.27* 1.84*   Hepatitis Panel No results found for this basename: HEPBSAG, HCVAB, HEPAIGM, HEPBIGM,  in the last 72 hours  Studies/Results: No results found.    Ass* Coffee like emesis, resolved. Felt secondary to portal gastropathy. Pt on 40 mg Nadolol at home and currently.  EGD 2/19 with non-bleeding varices. Friable portal gastritis, oozes with touch of scope, but  no major hemorrhaging. This is not amenable to endoscopic therapy. Day 4/5 of Cipro.  Okay to discharge home.  Please instruct patient to make a followup appointment in 4-6 weeks    Principal Problem:   Hematemesis Active Problems:   ETOH abuse   Hepatitis C   Cirrhosis   Thrombocytopenia, acquired   Hypothyroidism   Diabetes mellitus type 2, uncontrolled, with complications   Ascites   GIB (gastrointestinal bleeding)     LOS: 3 days   Erskine Emery  04/18/2013, 12:35 PM

## 2013-04-18 NOTE — Progress Notes (Signed)
Spoke with daughter [name] and she will be here to pick up her father within the hour.

## 2013-04-18 NOTE — Discharge Summary (Signed)
Physician Discharge Summary  Tony Boyd [id number] DOB: [birth year] DOA: 04/15/2013  PCP: Hollace Kinnier, DO  Admit date: 04/15/2013 Discharge date: 04/18/2013  Time spent: 40 minutes  Recommendations for Outpatient Follow-up:  1. Followup with primary care physician within one week. 2. Followup with gastroenterology in 3-4 weeks  Discharge Diagnoses:  Principal Problem:   Hematemesis Active Problems:   ETOH abuse   Hepatitis C   Cirrhosis   Thrombocytopenia, acquired   Hypothyroidism   Diabetes mellitus type 2, uncontrolled, with complications   Ascites   GIB (gastrointestinal bleeding)   Discharge Condition: Stable  Diet recommendation: Heart healthy diet.  Filed Weights   04/16/13 0846 04/17/13 0439  Weight: 98.5 kg (217 lb 2.5 oz) 106.142 kg (234 lb)    History of present illness:  Tony Boyd is a 58 y.o. male with PMH of ESLD, portal HTN, Hep C, alcoholism, seizures, anemia, thrombocytopenia, DM , hypothyroidism presented with 3 episodes of hematemesis, and one time melena; he also reports dizziness, complicated with presyncope yesterday; he has had similar episodes in the past associated with variceal GIB, recommended to quit alcohol use, but still drinking etoh, last use was 2/17; denies fever, chills, no chest pain, no focal neuro symptoms   Hospital Course:   GI bleed  -Likely upper GI bleed, patient has history of variceal bleed secondary to portal hypertension.  -He said this happened over the weekend, the episode of hematemesis and melena.  -No vomiting yesterday or today. Started on ciprofloxacin because of bleeding and settings of chronic liver disease  -Started on octreotide, FFP, vitamin K to reverse coagulopathy, INR is 2.0.  -Portal hypertensive gastropathy, DC octreotide and continue the PPIs, Increased the omeprazole to 40 mg daily.   Anemia  -Acute on chronic anemia, has chronic anemia secondary to liver disease, exacerbated by acute blood  loss.  -His bleeding is complicated by coagulopathy and thrombocytopenia because of ESLD.  -Has mild decrease in hemoglobin. Baseline hemoglobin is 11.0-11.5, patient present with hemoglobin of 10.5.   End stage liver disease  -With portal hypertension, ascites coagulopathy, hypoalbuminemia and hyponatremia.  -This is secondary to chronic hepatitis C and alcoholism.  -Patient still drinks alcohol, counseled, on diuretics hold.   Acute on chronic hyponatremia  -Patient has chronic hyponatremia secondary to chronic liver disease.  -Baseline about 128/30, presented with sodium of 118.  -Diuretics held, given IV fluids, sodium back to around baseline.   Alcoholism  -With history of seizure unclear if it's secondary to withdrawal or primary seizure. Still actively drink alcohol.  -Reported to me last time he drank was on his birthday on [birth year].  Diabetes mellitus type 2  -Hemoglobin A1c of 5.5 about 3 months ago, check hemoglobin A1c.  -A1c of 5.5 indicates good glycemic control.  Hepatic encephalopathy -Mild confusion on admission, elevated ammonia level. His lactulose increased. -On discharge patient is on lactulose and Xifaxan, instructed to increase lactulose as needed to achieve 2-3 BMs per day.  Procedures:  EGD done by Dr. Deatra Ina on 04/16/2013 showed grade 3 esophageal varices, portal hypertensive gastropathy with source of GI bleed was probably portal hypertensive gastropathy, no stigmata of bleeding from the esophageal varices.  Consultations:  [facility], Dr. Deatra Ina  Discharge Exam: Filed Vitals:   04/18/13 0338  BP: 118/72  Pulse: 74  Temp: 97.7 F (36.5 C)  Resp: 20   General: Alert and awake, oriented x3, not in any acute distress. HEENT: anicteric sclera, pupils reactive to light and accommodation, EOMI CVS: S1-S2  clear, no murmur rubs or gallops Chest: clear to auscultation bilaterally, no wheezing, rales or rhonchi Abdomen: soft nontender,  nondistended, normal bowel sounds, no organomegaly Extremities: Chronic lower extremity skin changes Neuro: Cranial nerves II-XII intact, no focal neurological deficits  Discharge Instructions  Discharge Orders   Future Appointments Provider Department Dept Phone   04/23/2013 2:45 PM Pricilla Larsson, NP [facility] [phone]   Future Orders Complete By Expires   Diet - low sodium heart healthy  As directed    Increase activity slowly  As directed        Medication List         bisacodyl 10 MG suppository  Commonly known as:  DULCOLAX  Place 10 mg rectally as needed for moderate constipation (constipation).     ferrous sulfate 325 (65 FE) MG tablet  Take 1 tablet (325 mg total) by mouth at bedtime.     folic acid 1 MG tablet  Commonly known as:  FOLVITE  Take 1 tablet (1 mg total) by mouth every morning.     furosemide 40 MG tablet  Commonly known as:  LASIX  Take 1 tablet (40 mg total) by mouth 2 (two) times daily.     insulin aspart 100 UNIT/ML injection  Commonly known as:  novoLOG  Inject 5 Units into the skin 3 (three) times daily before meals.     insulin glargine 100 UNIT/ML injection  Commonly known as:  LANTUS  Inject 0.12 mLs (12 Units total) into the skin at bedtime.     lactulose 10 GM/15ML solution  Commonly known as:  CHRONULAC  Take 67.5 mLs (45 g total) by mouth 3 (three) times daily. Can take upto 5 times a day, titrate for 2-3 soft stools per day     levETIRAcetam 500 MG tablet  Commonly known as:  KEPPRA  Take 2 tablets (1,000 mg total) by mouth 2 (two) times daily.     levothyroxine 75 MCG tablet  Commonly known as:  SYNTHROID, LEVOTHROID  Take 1 tablet (75 mcg total) by mouth every morning.     multivitamin with minerals Tabs tablet  Take 1 tablet by mouth every morning.     nadolol 40 MG tablet  Commonly known as:  CORGARD  Take 1 tablet (40 mg total) by mouth every morning.     omeprazole 40 MG capsule  Commonly known as:   PRILOSEC  Take 1 capsule (40 mg total) by mouth daily.     ondansetron 4 MG tablet  Commonly known as:  ZOFRAN  Take 4 mg by mouth every 8 (eight) hours as needed for nausea or vomiting.     oxyCODONE 5 MG immediate release tablet  Commonly known as:  Oxy IR/ROXICODONE  Take 1 tablet (5 mg total) by mouth every 6 (six) hours as needed for severe pain.     PARoxetine 20 MG tablet  Commonly known as:  PAXIL  Take 20 mg by mouth daily.     potassium chloride SA 20 MEQ tablet  Commonly known as:  K-DUR,KLOR-CON  Take 20 mEq by mouth 2 (two) times daily.     rifaximin 550 MG Tabs tablet  Commonly known as:  XIFAXAN  Take 1 tablet (550 mg total) by mouth 2 (two) times daily.     risperiDONE 0.25 MG tablet  Commonly known as:  RISPERDAL  Take 0.25 mg by mouth 2 (two) times daily.     spironolactone 100 MG tablet  Commonly known as:  ALDACTONE  Take 2 tablets (200 mg total) by mouth every morning.       Allergies  Allergen Reactions  . Droperidol Other (See Comments)    unknown  . Penicillins Swelling  . Toradol [Ketorolac Tromethamine] Hives       Follow-up Information   Follow up with REED, TIFFANY, DO In 1 week.   Specialty:  Geriatric Medicine   Contact information:   [address] [place] [address] [phone]       Follow up with Melvia Heaps, MD In 3 weeks.   Specialty:  Gastroenterology   Contact information:   [address] [place] [address] [phone]        The results of significant diagnostics from this hospitalization (including imaging, microbiology, ancillary and laboratory) are listed below for reference.    Significant Diagnostic Studies: Dg Lumbar Spine Complete  04/10/2013   CLINICAL DATA:  Fall.  Back pain.  EXAM: LUMBAR SPINE - COMPLETE 4+ VIEW  COMPARISON:  CT ABD/PELVIS W CM dated 02/18/2013  FINDINGS: Degenerative grade I anterolisthesis of L4 on L5, associated with severe facet arthrosis. L4-L5 vacuum disc. Chronic L3  superior endplate compression fracture without interval change from recent CT. No acute osseous abnormality. Lumbosacral transitional anatomy. Rudimentary T12 ribs.  IMPRESSION: Chronic changes of the lumbar spine.  No acute injury.   Electronically Signed   By: Andreas Newport M.D.   On: 04/10/2013 00:25   Ct Head Wo Contrast  03/23/2013   CLINICAL DATA:  Altered mental status, lethargy, lightheadedness.  EXAM: CT HEAD WITHOUT CONTRAST  TECHNIQUE: Contiguous axial images were obtained from the base of the skull through the vertex without intravenous contrast.  COMPARISON:  CT head January 11, 2013  FINDINGS: The ventricles and sulci are normal for age. No intraparenchymal hemorrhage, mass effect nor midline shift. Minimal patchy supratentorial white matter hypodensities are within normal range for patient's age and though non-specific suggest sequelae of chronic small vessel ischemic disease. No acute large vascular territory infarcts.  No abnormal extra-axial fluid collections. Basal cisterns are patent. Moderate calcific atherosclerosis of the carotid siphons.  No skull fracture. Small left maxillary mucosal retention cysts without paranasal sinus air fluid levels. Trace soft tissue density within the left mastoid tip, improved. Soft tissue within the external auditory canals bilaterally may reflect cerumen. The included ocular globes and orbital contents are non-suspicious.  IMPRESSION: No acute intracranial process.   Electronically Signed   By: Awilda Metro   On: 03/23/2013 04:01   Mr Brain Wo Contrast  04/10/2013   CLINICAL DATA:  Seizure  EXAM: MRI HEAD WITHOUT CONTRAST  TECHNIQUE: Multiplanar, multiecho pulse sequences of the brain and surrounding structures were obtained without intravenous contrast.  COMPARISON:  Prior CT from 03/23/2013  FINDINGS: Diffuse prominence of the CSF containing spaces is compatible with mild cerebral atrophy. Confluent T2/FLAIR hyperintensity within the  periventricular white matter is most consistent with mild chronic microvascular ischemic changes. Mildly increased T1 signal intensity seen within the basal ganglia bilaterally, which may be related to underlying liver disease. No other focal parenchymal signal abnormality is identified. No mass lesion, midline shift, or extra-axial fluid collection. Ventricles are normal in size without evidence of hydrocephalus. Hippocampi are symmetric bilaterally without signal abnormality.  No diffusion-weighted signal abnormality is identified to suggest acute intracranial infarct. Gray-white matter differentiation is maintained. Normal flow voids are seen within the intracranial vasculature. No intracranial hemorrhage identified.  The cervicomedullary junction is normal. Pituitary gland is within  normal limits. Pituitary stalk is midline. The globes and optic nerves demonstrate a normal appearance with normal signal intensity. The  The bone marrow signal intensity is normal. Calvarium is intact. Visualized upper cervical spine is within normal limits.  Scalp soft tissues are unremarkable.  Small amount of fluid density noted within the inferior aspect of the maxillary sinuses bilaterally. Paranasal sinuses are otherwise clear. Small bilateral amount of fluid density noted within the mastoid air cells bilaterally.  IMPRESSION: 1. No acute intracranial infarct or other abnormality identified. 2. Mildly increased T1 signal intensity within the basal ganglia bilaterally, which may be related to underlying liver disease. 3. Mild atrophy with chronic microvascular ischemic disease.   Electronically Signed   By: Jeannine Boga M.D.   On: 04/10/2013 04:13   Dg Chest Portable 1 View  03/23/2013   CLINICAL DATA:  Altered mental status, shortness of breath.  EXAM: PORTABLE CHEST - 1 VIEW  COMPARISON:  Chest radiograph February 18, 2013  FINDINGS: Cardiac silhouette appears mildly enlarged unchanged, mediastinal silhouette is  unremarkable. Similar central pulmonary vasculature congestion with increasing mild interstitial prominence. No pleural effusions or focal consolidations. No pneumothorax.  Multiple EKG lines overlie the patient and may obscure subtle underlying pathology. Soft tissue planes and included osseous structures are nonsuspicious.  IMPRESSION: Mild cardiomegaly with increasing mild interstitial prominence which may reflect interstitial edema.   Electronically Signed   By: Elon Alas   On: 03/23/2013 02:01   US Abdomen Limited Ruq  04/07/2013   CLINICAL DATA:  Liver mass.  Cholecystectomy.  History of cirrhosis.  EXAM: US ABDOMEN LIMITED - RIGHT UPPER QUADRANT  COMPARISON:  CT ABD/PELVIS W CM dated 02/18/2013  FINDINGS: Gallbladder:  Cholecystectomy.  Common bile duct:  Diameter: 7 mm, within normal limits status post cholecystectomy.  Liver:  No discrete mass identified by ultrasound. Nodular contour compatible with hepatic cirrhosis. Diffusely increased echotexture.  IMPRESSION: 1. Hepatic cirrhosis without a focal mass identified. 2. Cholecystectomy.   Electronically Signed   By: Dereck Ligas M.D.   On: 04/07/2013 00:58    Microbiology: No results found for this or any previous visit (from the past 240 hour(s)).   Labs: Basic Metabolic Panel:  Recent Labs Lab 04/15/13 1255 04/16/13 0435 04/18/13 0457  NA 118* 126* 132*  K 5.3 5.3 4.8  CL 86* 95* 97  CO2 22 21 27   GLUCOSE 206* 138* 124*  BUN 11 9 10   CREATININE 0.65 0.60 0.63  CALCIUM 7.7* 7.8* 8.2*   Liver Function Tests:  Recent Labs Lab 04/15/13 1255  AST 49*  ALT 34  ALKPHOS 175*  BILITOT 2.7*  PROT 6.8  ALBUMIN 2.5*    Recent Labs Lab 04/15/13 1255  LIPASE 88*    Recent Labs Lab 04/15/13 1255  AMMONIA 85*   CBC:  Recent Labs Lab 04/15/13 1255 04/15/13 1620 04/15/13 2125 04/16/13 0435 04/17/13 0500 04/18/13 0457  WBC 7.5  --   --  4.8 2.5* 3.4*  HGB 11.1* 10.8* 10.5* 10.4* 9.0* 9.6*  HCT 30.2*  28.9* 28.5* 28.5* 25.8* 27.3*  MCV 96.5  --   --  96.9 101.6* 101.5*  PLT 51*  --   --  34* 26* 32*   Cardiac Enzymes: No results found for this basename: CKTOTAL, CKMB, CKMBINDEX, TROPONINI,  in the last 168 hours BNP: BNP (last 3 results)  Recent Labs  12/03/12 1435 01/11/13 2055 02/18/13 0234  PROBNP 297.7* 207.4* 985.8*   CBG:  Recent Labs Lab 04/17/13 1954  04/18/13 0018 04/18/13 0332 04/18/13 0756 04/18/13 1151  GLUCAP 156* 114* 124* 139* 158*       Signed:  Dajour Pierpoint A  [facility] 04/18/2013, 12:41 PM

## 2013-04-19 LAB — GLUCOSE, CAPILLARY
Glucose-Capillary: 148 mg/dL — ABNORMAL HIGH (ref 70–99)
Glucose-Capillary: 130 mg/dL — ABNORMAL HIGH (ref 70–99)
Glucose-Capillary: 133 mg/dL — ABNORMAL HIGH (ref 70–99)

## 2013-04-19 MED ORDER — FUROSEMIDE 40 MG PO TABS: 40.0000 mg | ORAL_TABLET | Freq: Two times a day (BID) | ORAL | Status: DC

## 2013-04-19 MED ORDER — LEVETIRACETAM 500 MG PO TABS
1000.0000 mg | ORAL_TABLET | Freq: Two times a day (BID) | ORAL | Status: DC
Start: 2013-04-19 — End: 2013-08-06

## 2013-04-19 MED ORDER — FERROUS SULFATE 325 (65 FE) MG PO TABS: 325.0000 mg | ORAL_TABLET | Freq: Every day | ORAL | Status: AC

## 2013-04-19 MED ORDER — LEVOTHYROXINE SODIUM 75 MCG PO TABS: 75.0000 ug | ORAL_TABLET | Freq: Every morning | ORAL | Status: DC

## 2013-04-19 MED ORDER — FOLIC ACID 1 MG PO TABS: 1.0000 mg | ORAL_TABLET | Freq: Every morning | ORAL | Status: AC

## 2013-04-19 MED ORDER — ONDANSETRON HCL 4 MG PO TABS: 4.0000 mg | ORAL_TABLET | Freq: Three times a day (TID) | ORAL | Status: DC | PRN

## 2013-04-19 MED ORDER — NADOLOL 40 MG PO TABS: 40.0000 mg | ORAL_TABLET | Freq: Every morning | ORAL | Status: AC

## 2013-04-19 MED ORDER — RISPERIDONE 0.25 MG PO TABS: 0.2500 mg | ORAL_TABLET | Freq: Two times a day (BID) | ORAL | Status: DC

## 2013-04-19 MED ORDER — LACTULOSE 10 GM/15ML PO SOLN: 45.0000 g | Freq: Three times a day (TID) | ORAL | Status: DC

## 2013-04-19 MED ORDER — SPIRONOLACTONE 100 MG PO TABS: 200.0000 mg | ORAL_TABLET | Freq: Every morning | ORAL | Status: AC

## 2013-04-19 MED ORDER — RIFAXIMIN 550 MG PO TABS: 550.0000 mg | ORAL_TABLET | Freq: Two times a day (BID) | ORAL | Status: DC

## 2013-04-19 MED ORDER — ADULT MULTIVITAMIN W/MINERALS CH: 1.0000 | ORAL_TABLET | Freq: Every morning | ORAL | Status: AC

## 2013-04-19 MED ORDER — INSULIN GLARGINE 100 UNIT/ML ~~LOC~~ SOLN: 12.0000 [IU] | Freq: Every day | SUBCUTANEOUS | Status: DC

## 2013-04-19 MED ORDER — INSULIN ASPART 100 UNIT/ML ~~LOC~~ SOLN: 5.0000 [IU] | Freq: Three times a day (TID) | SUBCUTANEOUS | Status: DC

## 2013-04-19 MED ORDER — PAROXETINE HCL 20 MG PO TABS: 20.0000 mg | ORAL_TABLET | Freq: Every day | ORAL | Status: DC

## 2013-04-19 NOTE — Clinical Social Work Note (Signed)
Non emergency police left message for CSW. They did not get a response at daughter's home at [address]. RN informed and RN plans to try again if patient's family does not arrive before the end of her shift.  Liz Beach, [place], [place], [phone]

## 2013-04-19 NOTE — Clinical Social Work Note (Signed)
CSW has contacted non emergency to ask for assistance in getting in contact with patient's daughter. CSW gave the address listed for patient's daughter [name]. Patient stated that he would go to his landlords house to get a key for his apartment, but patient is not able to remember landlords number and is unsure of whether or not the landlord will be at home. CSW provided information requested by non emergency police.  Liz Beach, [place], [place], [phone]

## 2013-04-19 NOTE — Progress Notes (Signed)
[facility] PROGRESS NOTE   Brycin Kille OYD:[id number] DOB: 05-22-1955 DOA: 04/15/2013 PCP: Hollace Kinnier, DO  HPI/Subjective: Was supposed to be discharged yesterday. Daughter showed up late to pick him up, she said it's place is not done. She is to pick him up today. He requested prescriptions for his medications.  Assessment/Plan: Principal Problem:   Hematemesis Active Problems:   ETOH abuse   Hepatitis C   Cirrhosis   Thrombocytopenia, acquired   Hypothyroidism   Diabetes mellitus type 2, uncontrolled, with complications   Ascites   GIB (gastrointestinal bleeding)   GI bleed -Likely upper GI bleed, patient has history of variceal bleed secondary to portal hypertension. -He said this happened over the weekend, the episode of hematemesis and melena. -No vomiting yesterday or today. Started on ciprofloxacin because of bleeding and settings of chronic liver disease -Started on octreotide, FFP, vitamin K to reverse coagulopathy, INR is 2.0. -Portal hypertensive gastropathy, DC octreotide and continue the PPIs.   Anemia -Acute on chronic anemia, has chronic anemia secondary to liver disease, exacerbated by acute blood loss. -His bleeding is complicated by coagulopathy and thrombocytopenia because of ESLD. -Has mild decrease in hemoglobin. Baseline hemoglobin is 11.0-11.5, patient present with hemoglobin of 10.5.  End stage liver disease -With portal hypertension, ascites coagulopathy, hypoalbuminemia and hyponatremia. -This is secondary to chronic hepatitis C and alcoholism. -Patient still drinks alcohol, counseled, on diuretics hold.  Acute on chronic hyponatremia -Patient has chronic hyponatremia secondary to chronic liver disease. -Baseline about 128/30, presented with sodium of 118. -Diuretics held, given IV fluids, sodium back to around baseline. I'll stop IV fluids after the EGD.   Alcoholism -With history of seizure unclear if it's secondary to withdrawal  or primary seizure. Still actively drink alcohol. -Reported to me last time he drank was on his birthday about 11 days ago.   Diabetes mellitus type 2  -Hemoglobin A1c of 5.5 about 3 months ago, check hemoglobin A1c. -A1c of 5.5 indicates good glycemic control.  Code Status: Full Family Communication: Plan discussed with the patient. Disposition Plan: Remains inpatient   Consultants:  [facility]  Procedures:  EGD done  Antibiotics:  Cipro   Objective: Filed Vitals:   04/19/13 0500  BP: 126/73  Pulse: 87  Temp: 97.8 F (36.6 C)  Resp: 20    Intake/Output Summary (Last 24 hours) at 04/19/13 0959 Last data filed at 04/19/13 0500  Gross per 24 hour  Intake    840 ml  Output      0 ml  Net    840 ml   Filed Weights   04/16/13 0846 04/17/13 0439  Weight: 98.5 kg (217 lb 2.5 oz) 106.142 kg (234 lb)    Exam: General: Alert and awake, oriented x3, not in any acute distress. HEENT: anicteric sclera, pupils reactive to light and accommodation, EOMI CVS: S1-S2 clear, no murmur rubs or gallops Chest: clear to auscultation bilaterally, no wheezing, rales or rhonchi Abdomen: soft nontender, nondistended, normal bowel sounds, no organomegaly Extremities: no cyanosis, clubbing or edema noted bilaterally Neuro: Cranial nerves II-XII intact, no focal neurological deficits  Data Reviewed: Basic Metabolic Panel:  Recent Labs Lab 04/15/13 1255 04/16/13 0435 04/18/13 0457  NA 118* 126* 132*  K 5.3 5.3 4.8  CL 86* 95* 97  CO2 22 21 27   GLUCOSE 206* 138* 124*  BUN 11 9 10   CREATININE 0.65 0.60 0.63  CALCIUM 7.7* 7.8* 8.2*   Liver Function Tests:  Recent Labs Lab 04/15/13 1255  AST  49*  ALT 34  ALKPHOS 175*  BILITOT 2.7*  PROT 6.8  ALBUMIN 2.5*    Recent Labs Lab 04/15/13 1255  LIPASE 88*    Recent Labs Lab 04/15/13 1255  AMMONIA 85*   CBC:  Recent Labs Lab 04/15/13 1255 04/15/13 1620 04/15/13 2125 04/16/13 0435 04/17/13 0500  04/18/13 0457  WBC 7.5  --   --  4.8 2.5* 3.4*  HGB 11.1* 10.8* 10.5* 10.4* 9.0* 9.6*  HCT 30.2* 28.9* 28.5* 28.5* 25.8* 27.3*  MCV 96.5  --   --  96.9 101.6* 101.5*  PLT 51*  --   --  34* 26* 32*   Cardiac Enzymes: No results found for this basename: CKTOTAL, CKMB, CKMBINDEX, TROPONINI,  in the last 168 hours BNP (last 3 results)  Recent Labs  12/03/12 1435 01/11/13 2055 02/18/13 0234  PROBNP 297.7* 207.4* 985.8*   CBG:  Recent Labs Lab 04/18/13 0756 04/18/13 1151 04/18/13 1730 04/18/13 2047 04/19/13 0748  GLUCAP 139* 158* 140* 127* 148*    Micro No results found for this or any previous visit (from the past 240 hour(s)).   Studies: No results found.  Scheduled Meds: . ciprofloxacin  400 mg Intravenous Q12H  . folic acid  1 mg Oral Daily  . insulin aspart  0-9 Units Subcutaneous 6 times per day  . lactulose  45 g Oral 5 X Daily  . levETIRAcetam  1,000 mg Oral BID  . levothyroxine  75 mcg Oral QAC breakfast  . LORazepam  0-4 mg Intravenous Q12H  . multivitamin with minerals  1 tablet Oral Daily  . nadolol  40 mg Oral Daily  . pantoprazole  40 mg Oral BID  . PARoxetine  20 mg Oral Daily  . rifaximin  550 mg Oral BID  . risperiDONE  0.25 mg Oral BID  . sodium chloride  3 mL Intravenous Q12H  . thiamine  100 mg Oral Daily   Or  . thiamine  100 mg Intravenous Daily   Continuous Infusions: . sodium chloride 20 mL/hr at 04/18/13 1042       Time spent: 35 minutes    [facility] A  [facility] Pager [phone] If 7PM-7AM, please contact night-coverage at [email or web address], password [facility] 04/19/2013, 9:59 AM  LOS: 4 days

## 2013-04-19 NOTE — Progress Notes (Signed)
    Progress Note   Subjective  no complaints   Objective   Vital signs in last 24 hours: Temp:  [97.8 F (36.6 C)-98.7 F (37.1 C)] 97.8 F (36.6 C) (02/22 0500) Pulse Rate:  [77-87] 87 (02/22 0500) Resp:  [20] 20 (02/22 0500) BP: (107-126)/(52-73) 126/73 mmHg (02/22 0500) SpO2:  [98 %-99 %] 99 % (02/22 0500) Last BM Date: 04/18/13 General:    white male in NAD Heart:  Regular rate and rhythm Abdomen:  Soft, nontender and nondistended. Normal bowel sounds. Extremities:  Without edema. Neurologic:  Alert and oriented,  grossly normal neurologically. Psych:  Cooperative. Normal mood and affect.    Lab Results:  Recent Labs  04/17/13 0500 04/18/13 0457  WBC 2.5* 3.4*  HGB 9.0* 9.6*  HCT 25.8* 27.3*  PLT 26* 32*   BMET  Recent Labs  04/18/13 0457  NA 132*  K 4.8  CL 97  CO2 27  GLUCOSE 124*  BUN 10  CREATININE 0.63  CALCIUM 8.2*   PT/INR  Recent Labs  04/16/13 1200 04/17/13 0500  LABPROT 24.3* 20.7*  INR 2.27* 1.84*    Studies/Results:   EGD 04/16/12  ENDOSCOPIC IMPRESSION:  1. grade 3 esophageal varices  2. portal hypertensive gastropathy  Source of GI bleeding was probably portal hypertensive gastropathy.  Because it is very friable. There is no stigmata of bleeding from  the esophageal varices with non-bleeding varices. Friable portal gastritis, oozes with touch of scope, but no major hemorrhaging. This is not amenable to endoscopic therapy    Assessment / Plan:   1. Coffee like emesis, resolved and felt secondary to portal gastropathy. Continue 40 mg Nadolol daily.  Hgb stable. INR 1.84. Continue PO BID PPI.   2. Decompensated HCV / ETOH cirrhosis, still drinks.   Continue Xifaxan, lactulose, and nadolol. His home diuretics have been on hold (not sure why but this is my first time seeing him this admission).     Should complete total of 7 days of antibiotics for SBP prophylaxis.   Continue low sodium diet.   AFP rising, it was 130 in  Aug and up to 242 in Dec 2014. No liver lesions on contrast CTscan in December 2014. We can follow up outpatient    Okay to discharge home. Please instruct patient to make a followup appointment in 4-6 weeks    LOS: 4 days   Tony Boyd  04/19/2013, 11:43 AM  GI Attending Note  I have personally taken an interval history, reviewed the chart, and examined the patient.  I agree with the extender's note, impression and recommendations.  Sandy Salaam. Deatra Ina, MD, [facility] [phone]

## 2013-04-19 NOTE — Clinical Social Work Note (Signed)
Weekend CSW received call from patient's RN. Patient was discharged yesterday, but daughter would not take patient home because she stated she did not feel comfortable with the patient going home. CSW has attempted to call both daughters listed in [place] and has left messages. Patient recently moved to a new apartment and cannot remember his address.  Liz Beach, [place], [place], [phone]

## 2013-04-20 NOTE — Care Management Note (Signed)
    Page 1 of 1   04/20/2013     10:04:52 AM   CARE MANAGEMENT NOTE 04/20/2013  Patient:  Tony Boyd, Tony Boyd   Account Number:  [id number]  Date Initiated:  04/20/2013  Documentation initiated by:  GRAVES-BIGELOW,Dorotha Hirschi  Subjective/Objective Assessment:   Pt admitted with AMS. Pt was d/c on yesterday.     Action/Plan:   CM was able to contact daughter [name] and CM was given pt's new address- [address] [place], [place] [address]. Home # [phone]. CM did give info to [facility]. Information/Orders faxed.   Anticipated DC Date:  04/19/2013   Anticipated DC Plan:  [place]  CM consult      [facility]  HOME HEALTH   Choice offered to / List presented to:  C-4 Adult Children        HH arranged  HH-1 RN  HH-10 DISEASE MANAGEMENT  HH-2 PT  HH-3 OT      [facility] agency  [place]   Status of service:  Completed, signed off Medicare Important Message given?   (If response is "NO", the following Medicare IM given date fields will be blank) Date Medicare IM given:   Date Additional Medicare IM given:    Discharge Disposition:  [place]  Per UR Regulation:  Reviewed for med. necessity/level of care/duration of stay  If discussed at [place] of Stay Meetings, dates discussed:    Comments:

## 2013-04-21 ENCOUNTER — Encounter: Payer: Self-pay | Admitting: *Deleted

## 2013-04-22 ENCOUNTER — Other Ambulatory Visit: Payer: Self-pay | Admitting: *Deleted

## 2013-04-22 DIAGNOSIS — B150 Hepatitis A with hepatic coma: Secondary | ICD-10-CM

## 2013-04-22 DIAGNOSIS — K7460 Unspecified cirrhosis of liver: Secondary | ICD-10-CM

## 2013-04-22 DIAGNOSIS — I509 Heart failure, unspecified: Secondary | ICD-10-CM

## 2013-04-22 DIAGNOSIS — E119 Type 2 diabetes mellitus without complications: Secondary | ICD-10-CM

## 2013-04-22 DIAGNOSIS — K769 Liver disease, unspecified: Secondary | ICD-10-CM

## 2013-04-23 ENCOUNTER — Ambulatory Visit: Payer: Medicare Other | Admitting: Nurse Practitioner

## 2013-04-29 ENCOUNTER — Ambulatory Visit: Payer: Medicare Other | Admitting: Nurse Practitioner

## 2013-05-15 ENCOUNTER — Other Ambulatory Visit: Payer: Self-pay | Admitting: Internal Medicine

## 2013-05-15 MED ORDER — OXYCODONE HCL 5 MG PO TABS: 5.0000 mg | ORAL_TABLET | Freq: Four times a day (QID) | ORAL | Status: DC | PRN

## 2013-05-15 NOTE — Telephone Encounter (Signed)
Patient Request

## 2013-05-20 ENCOUNTER — Other Ambulatory Visit: Payer: Self-pay | Admitting: Internal Medicine

## 2013-05-21 ENCOUNTER — Other Ambulatory Visit: Payer: Self-pay | Admitting: *Deleted

## 2013-05-21 MED ORDER — RISPERIDONE 0.25 MG PO TABS: 0.2500 mg | ORAL_TABLET | Freq: Two times a day (BID) | ORAL | Status: AC

## 2013-05-21 NOTE — Telephone Encounter (Signed)
[facility] [address] 

## 2013-05-26 ENCOUNTER — Telehealth: Payer: Self-pay | Admitting: *Deleted

## 2013-05-26 NOTE — Telephone Encounter (Signed)
Called to inform you that an appointment was missed today because the patient was not home.

## 2013-05-27 ENCOUNTER — Telehealth: Payer: Self-pay | Admitting: *Deleted

## 2013-05-27 NOTE — Telephone Encounter (Signed)
Tony Boyd with [facility] called and wanted a verbal order for a social worker to go in home to evaluate patients medications. Verbal given

## 2013-05-31 ENCOUNTER — Encounter (HOSPITAL_COMMUNITY): Payer: Self-pay | Admitting: Emergency Medicine

## 2013-05-31 ENCOUNTER — Observation Stay (HOSPITAL_COMMUNITY)
Admission: EM | Admit: 2013-05-31 | Discharge: 2013-06-02 | Disposition: A | Discharge status: Home / Self Care | Payer: Medicare Other | Attending: Internal Medicine | Admitting: Internal Medicine

## 2013-05-31 ENCOUNTER — Emergency Department (HOSPITAL_COMMUNITY): Payer: Medicare Other

## 2013-05-31 DIAGNOSIS — E1165 Type 2 diabetes mellitus with hyperglycemia: Secondary | ICD-10-CM

## 2013-05-31 DIAGNOSIS — E118 Type 2 diabetes mellitus with unspecified complications: Secondary | ICD-10-CM

## 2013-05-31 DIAGNOSIS — G40909 Epilepsy, unspecified, not intractable, without status epilepticus: Secondary | ICD-10-CM | POA: Diagnosis present

## 2013-05-31 DIAGNOSIS — E039 Hypothyroidism, unspecified: Secondary | ICD-10-CM | POA: Diagnosis present

## 2013-05-31 DIAGNOSIS — G8929 Other chronic pain: Secondary | ICD-10-CM | POA: Diagnosis present

## 2013-05-31 DIAGNOSIS — R079 Chest pain, unspecified: Secondary | ICD-10-CM | POA: Diagnosis present

## 2013-05-31 DIAGNOSIS — D638 Anemia in other chronic diseases classified elsewhere: Secondary | ICD-10-CM | POA: Diagnosis present

## 2013-05-31 DIAGNOSIS — F1010 Alcohol abuse, uncomplicated: Secondary | ICD-10-CM | POA: Diagnosis present

## 2013-05-31 DIAGNOSIS — D689 Coagulation defect, unspecified: Secondary | ICD-10-CM | POA: Diagnosis present

## 2013-05-31 DIAGNOSIS — K7460 Unspecified cirrhosis of liver: Secondary | ICD-10-CM

## 2013-05-31 DIAGNOSIS — D696 Thrombocytopenia, unspecified: Secondary | ICD-10-CM | POA: Diagnosis present

## 2013-05-31 DIAGNOSIS — B1920 Unspecified viral hepatitis C without hepatic coma: Secondary | ICD-10-CM | POA: Diagnosis present

## 2013-05-31 DIAGNOSIS — IMO0002 Reserved for concepts with insufficient information to code with codable children: Secondary | ICD-10-CM

## 2013-05-31 DIAGNOSIS — F4310 Post-traumatic stress disorder, unspecified: Secondary | ICD-10-CM | POA: Diagnosis present

## 2013-05-31 DIAGNOSIS — R200 Anesthesia of skin: Secondary | ICD-10-CM

## 2013-05-31 DIAGNOSIS — M549 Dorsalgia, unspecified: Secondary | ICD-10-CM

## 2013-05-31 HISTORY — DX: Anxiety disorder, unspecified: F41.9

## 2013-05-31 HISTORY — DX: Major depressive disorder, single episode, unspecified: F32.9

## 2013-05-31 HISTORY — DX: Depression, unspecified: F32.A

## 2013-05-31 HISTORY — DX: Fibromyalgia: M79.7

## 2013-05-31 LAB — URINALYSIS, ROUTINE W REFLEX MICROSCOPIC
Bilirubin Urine: NEGATIVE
Glucose, UA: NEGATIVE mg/dL
HGB URINE DIPSTICK: NEGATIVE
Ketones, ur: NEGATIVE mg/dL
Leukocytes, UA: NEGATIVE
NITRITE: NEGATIVE
Protein, ur: NEGATIVE mg/dL
Specific Gravity, Urine: 1.014 (ref 1.005–1.030)
UROBILINOGEN UA: 2 mg/dL — AB (ref 0.0–1.0)
pH: 7 (ref 5.0–8.0)

## 2013-05-31 LAB — I-STAT CHEM 8, ED
BUN: 11 mg/dL (ref 6–23)
CREATININE: 0.8 mg/dL (ref 0.50–1.35)
Calcium, Ion: 1.08 mmol/L — ABNORMAL LOW (ref 1.12–1.23)
Chloride: 94 mEq/L — ABNORMAL LOW (ref 96–112)
Glucose, Bld: 126 mg/dL — ABNORMAL HIGH (ref 70–99)
HEMATOCRIT: 31 % — AB (ref 39.0–52.0)
HEMOGLOBIN: 10.5 g/dL — AB (ref 13.0–17.0)
Potassium: 4.1 mEq/L (ref 3.7–5.3)
SODIUM: 137 meq/L (ref 137–147)
TCO2: 31 mmol/L (ref 0–100)

## 2013-05-31 LAB — I-STAT TROPONIN, ED: TROPONIN I, POC: 0 ng/mL (ref 0.00–0.08)

## 2013-05-31 LAB — PROTIME-INR
INR: 1.67 — AB (ref 0.00–1.49)
PROTHROMBIN TIME: 19.2 s — AB (ref 11.6–15.2)

## 2013-05-31 MED ORDER — MORPHINE SULFATE 4 MG/ML IJ SOLN
4.0000 mg | Freq: Once | INTRAMUSCULAR | Status: AC
  Administered 2013-05-31: 4 mg via INTRAVENOUS
  Filled 2013-05-31: qty 1

## 2013-05-31 NOTE — ED Notes (Addendum)
Pt c/o L shoulder and arm tingling since 5pm. Denies chest pain

## 2013-05-31 NOTE — ED Provider Notes (Signed)
TIME SEEN: 10:35 PM  CHIEF COMPLAINT: Chest pain and shortness of breath, left-sided numbness  HPI: Patient is a 58 year old male with a history of alcohol abuse, hepatitis C, hepatocellular carcinoma, diabetes, history of prior tobacco use who presents emergency department with chest pressure, shortness of breath and nausea that started today at rest. He states that his chest pain is almost completely gone and he is feeling better with oxygen. He is also had intermittent palpitations. He also states that he has had left face, arm and leg numbness. He went to sleep at 4:30 today to take a nap when he woke up at 7 PM he had left-sided numbness. No weakness. He states he has been told he has a brain aneurysm. Denies any headache. His last stress test was 3-4 years ago. He denies a history of cardiac catheterization. No history of CVA or TIA. His PCP is with the [place]. He has a significant family history of cardiac disease.  ROS: See HPI Constitutional: no fever  Eyes: no drainage  ENT: no runny nose   Cardiovascular:   chest pain  Resp:  SOB  GI: no vomiting GU: no dysuria Integumentary: no rash  Allergy: no hives  Musculoskeletal: no leg swelling  Neurological: no slurred speech ROS otherwise negative  PAST MEDICAL HISTORY/PAST SURGICAL HISTORY:  Past Medical History  Diagnosis Date  . Cirrhosis of liver   . Hepatitis C   . ETOH abuse   . Hypothyroid   . Psychosis   . Bipolar disorder   . Seizures   . Arthritis     chronic back pain  . Coagulopathy onset prior to 2009  . Thrombocytopenia onsetprior to 2009  . Pancytopenia onset prior to 2009    with anemia, low platelets  . Hyponatremia onset prior to 2009  . Korsakoff psychosis   . H/O abdominal abscess 2012    abcess involving ventral hernia. drained 06/2010 by IR  . 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 Hepatic encephalopathy prior to 2009  . H/O renal failure   . Ascites 01/2013    too minor to tap on ultrasound  . Peripheral vascular disease   . GERD (gastroesophageal reflux disease)   . Hypothyroidism 06/02/2012  . Obesity   . Cellulitis and abscess of upper arm and forearm   . Chronic kidney disease, unspecified   . Type 2 diabetes mellitus with diabetic neuropathy   . Compression fracture of L3 lumbar vertebra 01/2013  . Hernia of abdominal wall 2012    multiple ventral hernias    MEDICATIONS:  Prior to Admission medications   Medication Sig Start Date End Date Taking? Authorizing Provider  ALPRAZolam Duanne Moron) 1 MG tablet Take 1 mg by mouth at bedtime as needed for anxiety.   Yes Historical Provider, MD  ferrous sulfate 325 (65 FE) MG tablet Take 1 tablet (325 mg total) by mouth at bedtime. 04/19/13  Yes Verlee Monte, MD  folic acid (FOLVITE) 1 MG tablet Take 1 tablet (1 mg total) by mouth every morning. 04/19/13  Yes Verlee Monte, MD  furosemide (LASIX) 40 MG tablet Take 1 tablet (40 mg total) by mouth 2 (two) times daily. 04/19/13  Yes Verlee Monte, MD  insulin aspart (NOVOLOG) 100 UNIT/ML injection Inject 5 Units into the skin 3 (three) times daily before meals. 04/19/13  Yes Verlee Monte, MD  insulin glargine (LANTUS) 100 UNIT/ML injection Inject 0.12 mLs (12 Units total) into the skin at  bedtime. 04/19/13  Yes Verlee Monte, MD  lactulose (CHRONULAC) 10 GM/15ML solution Take 67.5 mLs (45 g total) by mouth 3 (three) times daily. Can take upto 5 times a day, titrate for 2-3 soft stools per day 04/19/13  Yes Verlee Monte, MD  levETIRAcetam (KEPPRA) 500 MG tablet Take 2 tablets (1,000 mg total) by mouth 2 (two) times daily. 04/19/13  Yes Verlee Monte, MD  levothyroxine (SYNTHROID, LEVOTHROID) 75 MCG tablet Take 75 mcg by mouth daily before breakfast.   Yes Historical Provider, MD  Multiple Vitamin (MULTIVITAMIN WITH MINERALS) TABS tablet Take 1 tablet by mouth every morning. 04/19/13  Yes Verlee Monte, MD  nadolol (CORGARD) 40 MG tablet Take 1 tablet (40 mg total) by mouth  every morning. 04/19/13  Yes Verlee Monte, MD  omeprazole (PRILOSEC) 40 MG capsule Take 1 capsule (40 mg total) by mouth daily. 04/18/13  Yes Verlee Monte, MD  ondansetron (ZOFRAN) 4 MG tablet Take 1 tablet (4 mg total) by mouth every 8 (eight) hours as needed for nausea or vomiting. 04/19/13  Yes Verlee Monte, MD  Oxycodone HCl 10 MG TABS Take 10-30 mg by mouth 3 (three) times daily. Takes 30mg  in am, 10mg  around lunch, 10mg  at bedtime   Yes Historical Provider, MD  PARoxetine (PAXIL) 20 MG tablet Take 1 tablet (20 mg total) by mouth daily. 04/19/13  Yes Verlee Monte, MD  potassium chloride SA (K-DUR,KLOR-CON) 20 MEQ tablet Take 20 mEq by mouth 2 (two) times daily.   Yes Historical Provider, MD  rifaximin (XIFAXAN) 550 MG TABS tablet Take 1 tablet (550 mg total) by mouth 2 (two) times daily. 04/19/13  Yes Verlee Monte, MD  risperiDONE (RISPERDAL) 0.25 MG tablet Take 1 tablet (0.25 mg total) by mouth 2 (two) times daily. 05/21/13  Yes Pricilla Larsson, NP  spironolactone (ALDACTONE) 100 MG tablet Take 2 tablets (200 mg total) by mouth every morning. 04/19/13  Yes Verlee Monte, MD    ALLERGIES:  Allergies  Allergen Reactions  . Ativan [Lorazepam] Other (See Comments)    Makes patient very weak and cant mentate appropriately  . Droperidol Other (See Comments)    unknown  . Penicillins Swelling  . Toradol [Ketorolac Tromethamine] Hives    SOCIAL HISTORY:  History  Substance Use Topics  . Smoking status: Former Smoker    Types: Cigarettes    Quit date: 02/15/2008  . Smokeless tobacco: Never Used  . Alcohol Use: 0.6 oz/week    1 Cans of beer per week     Comment: Quit drinking 1 year ago.    04/15/2013   drinks two  40 oz beer   daily     FAMILY HISTORY: Family History  Problem Relation Age of Onset  . Heart disease Mother     MI  . Lung cancer Brother     EXAM: BP 130/70  Pulse 74  Temp(Src) 97.3 F (36.3 C) (Oral)  Resp 19  Ht 5\' 8"  (1.727 m)  Wt 240 lb (108.863 kg)  BMI 36.50  kg/m2  SpO2 98% CONSTITUTIONAL: Alert and oriented and responds appropriately to questions. Well-appearing; well-nourished HEAD: Normocephalic EYES: Conjunctivae clear, PERRL ENT: normal nose; no rhinorrhea; moist mucous membranes; pharynx without lesions noted NECK: Supple, no meningismus, no LAD  CARD: RRR; S1 and S2 appreciated; no murmurs, no clicks, no rubs, no gallops RESP: Normal chest excursion without splinting or tachypnea; breath sounds clear and equal bilaterally; no wheezes, no rhonchi, no rales,  ABD/GI: Normal bowel sounds; non-distended; soft, non-tender,  no rebound, no guarding BACK:  The back appears normal and is non-tender to palpation, there is no CVA tenderness EXT: Normal ROM in all joints; non-tender to palpation; no edema; normal capillary refill; no cyanosis    SKIN: Normal color for age and race; warm NEURO: Moves all extremities equally; strength 5/5 in all 4 extremities, cranial nerves II through XII intact, sensation to light touch diminished in the left face and left arm, normal sensation in bilateral lower extremities, no dysmetria finger to nose testing, no asterixis PSYCH: The patient's mood and manner are appropriate. Grooming and personal hygiene are appropriate.  MEDICAL DECISION MAKING: Patient here with episode of chest pain shortness of breath as well as nausea. He has a history of diabetes, prior tobacco use and a significant family history of cardiac disease. He has a heart score 4. His last stress test was over 3 years ago. Patient is also complaining of left-sided numbness. Concern for possible TIA. His left lower Ebony Hail he notices improving. His NIH stroke scale would be a 2. He is not a TPA candidate given his history of brain aneurysm and the fact that his symptoms are spontaneously improving. The patient will need admission for ACS rule out and TIA workup.  ED PROGRESS: Patient is chest pain-free. Troponin is negative. Chest x-ray may show a possible  right lower lobe infiltrate but he denies any fever or cough. No leukocytosis. Head CT shows no intracranial abnormality. We'll discuss with hospitalist for admission.     EKG Interpretation  Date/Time:  Sunday May 31 2013 22:14:40 EDT Ventricular Rate:  78 PR Interval:  162 QRS Duration: 82 QT Interval:  432 QTC Calculation: 492 R Axis:   -36 Text Interpretation:  Unusual P axis, possible ectopic atrial rhythm Left axis deviation Pulmonary disease pattern Prolonged QT Abnormal ECG Confirmed by Joie Reamer,  DO, Fenix Rorke ([id number]) on 05/31/2013 10:20:56 PM          [place], DO 06/01/13 [id number]

## 2013-05-31 NOTE — ED Notes (Signed)
Per EMS- patient here for c.o. Left shoulder pain radiating to the L arm since 5pm today. Denies SOB N/V

## 2013-05-31 NOTE — ED Notes (Signed)
Old and New EKG given to Dr Leonides Schanz

## 2013-06-01 ENCOUNTER — Encounter (HOSPITAL_COMMUNITY): Payer: Self-pay | Admitting: Internal Medicine

## 2013-06-01 ENCOUNTER — Observation Stay (HOSPITAL_COMMUNITY): Payer: Medicare Other

## 2013-06-01 ENCOUNTER — Emergency Department (HOSPITAL_COMMUNITY): Payer: Medicare Other

## 2013-06-01 DIAGNOSIS — R200 Anesthesia of skin: Secondary | ICD-10-CM | POA: Diagnosis present

## 2013-06-01 DIAGNOSIS — E118 Type 2 diabetes mellitus with unspecified complications: Secondary | ICD-10-CM

## 2013-06-01 DIAGNOSIS — IMO0002 Reserved for concepts with insufficient information to code with codable children: Secondary | ICD-10-CM

## 2013-06-01 DIAGNOSIS — E1165 Type 2 diabetes mellitus with hyperglycemia: Secondary | ICD-10-CM

## 2013-06-01 DIAGNOSIS — I517 Cardiomegaly: Secondary | ICD-10-CM

## 2013-06-01 DIAGNOSIS — G459 Transient cerebral ischemic attack, unspecified: Secondary | ICD-10-CM

## 2013-06-01 DIAGNOSIS — R079 Chest pain, unspecified: Secondary | ICD-10-CM | POA: Diagnosis present

## 2013-06-01 LAB — BASIC METABOLIC PANEL
BUN: 12 mg/dL (ref 6–23)
CO2: 28 meq/L (ref 19–32)
Calcium: 7.7 mg/dL — ABNORMAL LOW (ref 8.4–10.5)
Chloride: 96 mEq/L (ref 96–112)
Creatinine, Ser: 0.53 mg/dL (ref 0.50–1.35)
GFR calc Af Amer: >90 mL/min (ref >90)
GFR calc non Af Amer: >90 mL/min (ref >90)
GLUCOSE: 132 mg/dL — AB (ref 70–99)
POTASSIUM: 4.1 meq/L (ref 3.7–5.3)
SODIUM: 131 meq/L — AB (ref 137–147)

## 2013-06-01 LAB — RAPID URINE DRUG SCREEN, HOSP PERFORMED
Amphetamines: NOT DETECTED
BENZODIAZEPINES: NOT DETECTED
Barbiturates: NOT DETECTED
COCAINE: NOT DETECTED
Opiates: NOT DETECTED
Tetrahydrocannabinol: NOT DETECTED

## 2013-06-01 LAB — COMPREHENSIVE METABOLIC PANEL
ALT: 26 U/L (ref 0–53)
AST: 68 U/L — AB (ref 0–37)
Albumin: 2.2 g/dL — ABNORMAL LOW (ref 3.5–5.2)
Alkaline Phosphatase: 249 U/L — ABNORMAL HIGH (ref 39–117)
BILIRUBIN TOTAL: 3 mg/dL — AB (ref 0.3–1.2)
BUN: 11 mg/dL (ref 6–23)
CALCIUM: 7.6 mg/dL — AB (ref 8.4–10.5)
CO2: 29 mEq/L (ref 19–32)
CREATININE: 0.55 mg/dL (ref 0.50–1.35)
Chloride: 95 mEq/L — ABNORMAL LOW (ref 96–112)
GFR calc Af Amer: >90 mL/min (ref >90)
GFR calc non Af Amer: >90 mL/min (ref >90)
Glucose, Bld: 126 mg/dL — ABNORMAL HIGH (ref 70–99)
Potassium: 4 mEq/L (ref 3.7–5.3)
Sodium: 132 mEq/L — ABNORMAL LOW (ref 137–147)
TOTAL PROTEIN: 6.4 g/dL (ref 6.0–8.3)

## 2013-06-01 LAB — CBC WITH DIFFERENTIAL/PLATELET
Basophils Absolute: 0 10*3/uL (ref 0.0–0.1)
Basophils Relative: 0 % (ref 0–1)
EOS ABS: 0.1 10*3/uL (ref 0.0–0.7)
EOS PCT: 3 % (ref 0–5)
HEMATOCRIT: 29.1 % — AB (ref 39.0–52.0)
HEMOGLOBIN: 10.1 g/dL — AB (ref 13.0–17.0)
Lymphocytes Relative: 24 % (ref 12–46)
Lymphs Abs: 0.8 10*3/uL (ref 0.7–4.0)
MCH: 37.3 pg — AB (ref 26.0–34.0)
MCHC: 34.7 g/dL (ref 30.0–36.0)
MCV: 107.4 fL — ABNORMAL HIGH (ref 78.0–100.0)
MONO ABS: 0.3 10*3/uL (ref 0.1–1.0)
MONOS PCT: 10 % (ref 3–12)
Neutro Abs: 2.2 10*3/uL (ref 1.7–7.7)
Neutrophils Relative %: 64 % (ref 43–77)
Platelets: 32 10*3/uL — ABNORMAL LOW (ref 150–400)
RBC: 2.71 MIL/uL — ABNORMAL LOW (ref 4.22–5.81)
RDW: 15.2 % (ref 11.5–15.5)
WBC: 3.5 10*3/uL — ABNORMAL LOW (ref 4.0–10.5)

## 2013-06-01 LAB — CBC
HCT: 28.5 % — ABNORMAL LOW (ref 39.0–52.0)
HEMOGLOBIN: 9.7 g/dL — AB (ref 13.0–17.0)
MCH: 36.7 pg — ABNORMAL HIGH (ref 26.0–34.0)
MCHC: 34 g/dL (ref 30.0–36.0)
MCV: 108 fL — ABNORMAL HIGH (ref 78.0–100.0)
Platelets: 24 10*3/uL — CL (ref 150–400)
RBC: 2.64 MIL/uL — ABNORMAL LOW (ref 4.22–5.81)
RDW: 15.3 % (ref 11.5–15.5)
WBC: 2.5 10*3/uL — ABNORMAL LOW (ref 4.0–10.5)

## 2013-06-01 LAB — LIPID PANEL
CHOLESTEROL: 76 mg/dL (ref 0–200)
HDL: 46 mg/dL (ref >39)
LDL CALC: 19 mg/dL (ref 0–99)
Total CHOL/HDL Ratio: 1.7 RATIO
Triglycerides: 56 mg/dL (ref <150)
VLDL: 11 mg/dL (ref 0–40)

## 2013-06-01 LAB — GLUCOSE, CAPILLARY
GLUCOSE-CAPILLARY: 182 mg/dL — AB (ref 70–99)
GLUCOSE-CAPILLARY: 162 mg/dL — AB (ref 70–99)
Glucose-Capillary: 104 mg/dL — ABNORMAL HIGH (ref 70–99)
Glucose-Capillary: 170 mg/dL — ABNORMAL HIGH (ref 70–99)

## 2013-06-01 LAB — TROPONIN I: Troponin I: <0.3 ng/mL (ref <0.30)

## 2013-06-01 LAB — HEMOGLOBIN A1C
Hgb A1c MFr Bld: 5.7 % — ABNORMAL HIGH (ref <5.7)
Mean Plasma Glucose: 117 mg/dL — ABNORMAL HIGH (ref <117)

## 2013-06-01 LAB — PRO B NATRIURETIC PEPTIDE: PRO B NATRI PEPTIDE: 304 pg/mL — AB (ref 0–125)

## 2013-06-01 LAB — ETHANOL: Alcohol, Ethyl (B): <11 mg/dL (ref 0–11)

## 2013-06-01 MED ORDER — NADOLOL 40 MG PO TABS
40.0000 mg | ORAL_TABLET | Freq: Every morning | ORAL | Status: DC
  Administered 2013-06-01 – 2013-06-02 (×2): 40 mg via ORAL
  Filled 2013-06-01 (×2): qty 1

## 2013-06-01 MED ORDER — PANTOPRAZOLE SODIUM 40 MG PO TBEC
80.0000 mg | DELAYED_RELEASE_TABLET | Freq: Every day | ORAL | Status: DC
  Administered 2013-06-01 – 2013-06-02 (×2): 80 mg via ORAL
  Filled 2013-06-01 (×2): qty 2

## 2013-06-01 MED ORDER — ASPIRIN 81 MG PO CHEW
81.0000 mg | CHEWABLE_TABLET | Freq: Every day | ORAL | Status: DC
  Administered 2013-06-01 – 2013-06-02 (×2): 81 mg via ORAL
  Filled 2013-06-01 (×2): qty 1

## 2013-06-01 MED ORDER — LEVOTHYROXINE SODIUM 75 MCG PO TABS
75.0000 ug | ORAL_TABLET | Freq: Every day | ORAL | Status: DC
  Administered 2013-06-01 – 2013-06-02 (×2): 75 ug via ORAL
  Filled 2013-06-01 (×3): qty 1

## 2013-06-01 MED ORDER — OXYCODONE HCL 5 MG PO TABS
10.0000 mg | ORAL_TABLET | Freq: Three times a day (TID) | ORAL | Status: DC
  Administered 2013-06-01 – 2013-06-02 (×5): 10 mg via ORAL
  Filled 2013-06-01 (×5): qty 2

## 2013-06-01 MED ORDER — FERROUS SULFATE 325 (65 FE) MG PO TABS
325.0000 mg | ORAL_TABLET | Freq: Every day | ORAL | Status: DC
  Administered 2013-06-01: 325 mg via ORAL
  Filled 2013-06-01 (×2): qty 1

## 2013-06-01 MED ORDER — ADULT MULTIVITAMIN W/MINERALS CH
1.0000 | ORAL_TABLET | Freq: Every morning | ORAL | Status: DC
Start: 2013-06-01 — End: 2013-06-02
  Administered 2013-06-01 – 2013-06-02 (×2): 1 via ORAL
  Filled 2013-06-01 (×2): qty 1

## 2013-06-01 MED ORDER — FOLIC ACID 1 MG PO TABS
1.0000 mg | ORAL_TABLET | Freq: Every morning | ORAL | Status: DC
  Administered 2013-06-01 – 2013-06-02 (×2): 1 mg via ORAL
  Filled 2013-06-01 (×2): qty 1

## 2013-06-01 MED ORDER — INSULIN GLARGINE 100 UNIT/ML ~~LOC~~ SOLN
12.0000 [IU] | Freq: Every day | SUBCUTANEOUS | Status: DC
  Administered 2013-06-01: 12 [IU] via SUBCUTANEOUS
  Filled 2013-06-01 (×2): qty 0.12

## 2013-06-01 MED ORDER — OXYCODONE HCL 5 MG PO TABS: 10.0000 mg | ORAL_TABLET | Freq: Three times a day (TID) | ORAL | Status: DC

## 2013-06-01 MED ORDER — ACETAMINOPHEN 325 MG PO TABS: 650.0000 mg | ORAL_TABLET | ORAL | Status: DC | PRN

## 2013-06-01 MED ORDER — POTASSIUM CHLORIDE CRYS ER 20 MEQ PO TBCR
20.0000 meq | EXTENDED_RELEASE_TABLET | Freq: Two times a day (BID) | ORAL | Status: DC
  Administered 2013-06-01 – 2013-06-02 (×3): 20 meq via ORAL
  Filled 2013-06-01 (×5): qty 1

## 2013-06-01 MED ORDER — FUROSEMIDE 40 MG PO TABS
40.0000 mg | ORAL_TABLET | Freq: Two times a day (BID) | ORAL | Status: DC
  Administered 2013-06-01 – 2013-06-02 (×3): 40 mg via ORAL
  Filled 2013-06-01 (×5): qty 1

## 2013-06-01 MED ORDER — ONDANSETRON HCL 4 MG/2ML IJ SOLN: 4.0000 mg | Freq: Four times a day (QID) | INTRAMUSCULAR | Status: DC | PRN

## 2013-06-01 MED ORDER — SPIRONOLACTONE 100 MG PO TABS
200.0000 mg | ORAL_TABLET | Freq: Every morning | ORAL | Status: DC
  Administered 2013-06-01 – 2013-06-02 (×2): 200 mg via ORAL
  Filled 2013-06-01 (×2): qty 2

## 2013-06-01 MED ORDER — INSULIN ASPART 100 UNIT/ML ~~LOC~~ SOLN
5.0000 [IU] | Freq: Three times a day (TID) | SUBCUTANEOUS | Status: DC
  Administered 2013-06-02: 5 [IU] via SUBCUTANEOUS

## 2013-06-01 MED ORDER — ALPRAZOLAM 0.5 MG PO TABS: 1.0000 mg | ORAL_TABLET | Freq: Every evening | ORAL | Status: DC | PRN

## 2013-06-01 MED ORDER — RISPERIDONE 0.25 MG PO TABS
0.2500 mg | ORAL_TABLET | Freq: Two times a day (BID) | ORAL | Status: DC
  Administered 2013-06-01 – 2013-06-02 (×3): 0.25 mg via ORAL
  Filled 2013-06-01 (×5): qty 1

## 2013-06-01 MED ORDER — LEVETIRACETAM 500 MG PO TABS
1000.0000 mg | ORAL_TABLET | Freq: Two times a day (BID) | ORAL | Status: DC
  Administered 2013-06-01 – 2013-06-02 (×3): 1000 mg via ORAL
  Filled 2013-06-01 (×5): qty 2

## 2013-06-01 MED ORDER — LACTULOSE 10 GM/15ML PO SOLN
45.0000 g | Freq: Three times a day (TID) | ORAL | Status: DC
  Administered 2013-06-01 – 2013-06-02 (×4): 45 g via ORAL
  Filled 2013-06-01 (×6): qty 90

## 2013-06-01 MED ORDER — PAROXETINE HCL 20 MG PO TABS
20.0000 mg | ORAL_TABLET | Freq: Every day | ORAL | Status: DC
  Administered 2013-06-01 – 2013-06-02 (×2): 20 mg via ORAL
  Filled 2013-06-01 (×2): qty 1

## 2013-06-01 MED ORDER — ASPIRIN 81 MG PO CHEW
324.0000 mg | CHEWABLE_TABLET | Freq: Once | ORAL | Status: AC
  Administered 2013-06-01: 324 mg via ORAL
  Filled 2013-06-01: qty 4

## 2013-06-01 MED ORDER — RIFAXIMIN 550 MG PO TABS
550.0000 mg | ORAL_TABLET | Freq: Two times a day (BID) | ORAL | Status: DC
  Administered 2013-06-01 – 2013-06-02 (×3): 550 mg via ORAL
  Filled 2013-06-01 (×4): qty 1

## 2013-06-01 NOTE — Progress Notes (Signed)
  Echocardiogram 2D Echocardiogram has been performed.  Diamond Nickel 06/01/2013, 3:26 PM
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
## 2013-06-01 NOTE — Progress Notes (Signed)
VASCULAR LAB PRELIMINARY  PRELIMINARY  PRELIMINARY  PRELIMINARY  Carotid duplex  completed.    Preliminary report:  Bilateral:  1-39% ICA stenosis.  Vertebral artery flow is antegrade.      Idalis Hoelting, RVT 06/01/2013, 12:30 PM

## 2013-06-01 NOTE — Progress Notes (Signed)
[facility] PROGRESS NOTE  Tony Boyd YPP:[id number] DOB: [birth year] DOA: 05/31/2013 PCP: Hollace Kinnier, DO  Assessment/Plan: Chest pain Stable on telemetry. Serial cardiac enzymes negative. EKG unremarkable. 2-D echo shows mild LVH with normal EF of 55%. Seen by cardiology and is likely noncardiac. recommended no further workup and outpatient followup.  TIA-like symptoms Head CT unremarkable. MRI brain pending. Stable on telemetry. Continue aspirin  Cirrhosis of the liver Secondary to alcohol abuse and hep C. Compensated. Continue lactulose and rifaximin he repeated. continue Lasix and Aldactone. Continue propranolol. Counseled strongly on alcohol cessation. Continue thiamine, folate and multivitamin. No signs of withdrawal.  Pancytopenia Secondary to pseudocyst. Monitor closely  Type 2 diabetes mellitus Continue on sliding scale insulin and resume home dose insulin  Hypothyroidism Continue Synthroid  DVT prophylaxis: SCDs  Family Communication: None at bedside Code Status: Full code Disposition Plan: Home in a.m. if workup negative.   Consultants:  Cardiology  Procedures:  None  Antibiotics:  None  HPI/Subjective: Admission H&P the reviewed. Patient denies further chest pain. Still has some thickening over his left hand  Objective: Filed Vitals:   06/01/13 0803  BP: 119/54  Pulse: 69  Temp: 97.7 F (36.5 C)  Resp: 18   No intake or output data in the 24 hours ending 06/01/13 1844 Filed Weights   05/31/13 2221 06/01/13 0150  Weight: 108.863 kg (240 lb) 107.185 kg (236 lb 4.8 oz)    Exam:   General:  Middle aged obese male in no acute distress  HEENT: No pallor, moist oral mucosa  Cardiovascular: Normal W5-Y0, normal metabolic gallop  Chest: Clear to auscultation bilaterally, no added sounds  Abdomen: Soft, mildly distended, nontender, bowel sounds present  Extremities: Warm, no edema  CNS: AAO x3    Data Reviewed: Basic  Metabolic Panel:  Recent Labs Lab 05/31/13 2330 05/31/13 2338 06/01/13 0340  NA 132* 137 131*  K 4.0 4.1 4.1  CL 95* 94* 96  CO2 29  --  28  GLUCOSE 126* 126* 132*  BUN 11 11 12   CREATININE 0.55 0.80 0.53  CALCIUM 7.6*  --  7.7*   Liver Function Tests:  Recent Labs Lab 05/31/13 2330  AST 68*  ALT 26  ALKPHOS 249*  BILITOT 3.0*  PROT 6.4  ALBUMIN 2.2*   No results found for this basename: LIPASE, AMYLASE,  in the last 168 hours No results found for this basename: AMMONIA,  in the last 168 hours CBC:  Recent Labs Lab 05/31/13 2330 05/31/13 2338 06/01/13 0340  WBC 3.5*  --  2.5*  NEUTROABS 2.2  --   --   HGB 10.1* 10.5* 9.7*  HCT 29.1* 31.0* 28.5*  MCV 107.4*  --  108.0*  PLT 32*  --  24*   Cardiac Enzymes:  Recent Labs Lab 06/01/13 0340 06/01/13 0817  TROPONINI <0.30 <0.30   BNP (last 3 results)  Recent Labs  01/11/13 2055 02/18/13 0234 05/31/13 2330  PROBNP 207.4* 985.8* 304.0*   CBG:  Recent Labs Lab 06/01/13 0736 06/01/13 1242 06/01/13 1650  GLUCAP 104* 182* 162*    No results found for this or any previous visit (from the past 240 hour(s)).   Studies: Dg Chest 2 View  06/01/2013   CLINICAL DATA:  Chest pain  EXAM: CHEST  2 VIEW  COMPARISON:  Prior radiograph from 03/23/2013  FINDINGS: Mild cardiomegaly is stable as compared to prior. Mediastinal silhouette within normal limits.  The lungs are normally inflated. Hazy densities overlying the bilateral lung  bases are favored through be resultant from overlying soft tissue attenuation. No consolidative opacity seen on lateral projection. No pleural effusion or pulmonary edema is identified. There is no pneumothorax.  No acute osseous abnormality identified.  IMPRESSION: Hazy and patchy densities overlying the bilateral lung bases on frontal projection with no definite correlate on lateral projection, favored to reflect attenuation from overlying soft tissues. Possible infiltrates could be  considered in the correct clinical setting, although these are less favored.   Electronically Signed   By: Jeannine Boga M.D.   On: 06/01/2013 00:24   Ct Head Wo Contrast  06/01/2013   CLINICAL DATA:  Left-sided body numbness and pain. Shortness of breath and chest pressure.  EXAM: CT HEAD WITHOUT CONTRAST  TECHNIQUE: Contiguous axial images were obtained from the base of the skull through the vertex without intravenous contrast.  COMPARISON:  CT of the head performed 03/23/2013, and MRI of the brain performed 04/09/2013  FINDINGS: There is no evidence of acute infarction, mass lesion, or intra- or extra-axial hemorrhage on CT.  The posterior fossa, including the cerebellum, brainstem and fourth ventricle, is within normal limits. The third and lateral ventricles, and basal ganglia are unremarkable in appearance. The cerebral hemispheres are symmetric in appearance, with normal gray-white differentiation. No mass effect or midline shift is seen.  There is no evidence of fracture; visualized osseous structures are unremarkable in appearance. The orbits are within normal limits. There is mild partial opacification of the left maxillary sinus. The remaining paranasal sinuses and mastoid air cells are well-aerated. No significant soft tissue abnormalities are seen.  IMPRESSION: 1. No acute intracranial pathology seen on CT. 2. Mild partial opacification of the left maxillary sinus.   Electronically Signed   By: Garald Balding M.D.   On: 06/01/2013 00:22    Scheduled Meds: . aspirin  81 mg Oral Daily  . ferrous sulfate  325 mg Oral QHS  . folic acid  1 mg Oral q morning - 10a  . furosemide  40 mg Oral BID  . insulin aspart  5 Units Subcutaneous TID AC  . insulin glargine  12 Units Subcutaneous QHS  . lactulose  45 g Oral TID  . levETIRAcetam  1,000 mg Oral BID  . levothyroxine  75 mcg Oral QAC breakfast  . multivitamin with minerals  1 tablet Oral q morning - 10a  . nadolol  40 mg Oral q morning -  10a  . oxyCODONE  10 mg Oral TID  . pantoprazole  80 mg Oral Daily  . PARoxetine  20 mg Oral Daily  . potassium chloride SA  20 mEq Oral BID  . rifaximin  550 mg Oral BID  . risperiDONE  0.25 mg Oral BID  . spironolactone  200 mg Oral q morning - 10a   Continuous Infusions:     Time spent: 20 minutes    Ruchy Wildrick  [facility] Pager [phone]. If 7PM-7AM, please contact night-coverage at [email or web address], password [facility] 06/01/2013, 6:44 PM  LOS: 1 day

## 2013-06-02 DIAGNOSIS — R209 Unspecified disturbances of skin sensation: Secondary | ICD-10-CM

## 2013-06-02 DIAGNOSIS — K7460 Unspecified cirrhosis of liver: Secondary | ICD-10-CM

## 2013-06-02 LAB — GLUCOSE, CAPILLARY
GLUCOSE-CAPILLARY: 87 mg/dL (ref 70–99)
Glucose-Capillary: 181 mg/dL — ABNORMAL HIGH (ref 70–99)

## 2013-06-02 MED ORDER — OXYCODONE HCL 5 MG PO TABS
5.0000 mg | ORAL_TABLET | ORAL | Status: AC
  Administered 2013-06-02: 5 mg via ORAL
  Filled 2013-06-02: qty 1

## 2013-06-02 MED ORDER — OXYCODONE HCL 10 MG PO TABS: 10.0000 mg | ORAL_TABLET | Freq: Three times a day (TID) | ORAL | Status: DC

## 2013-06-02 MED ORDER — ALPRAZOLAM 1 MG PO TABS: 1.0000 mg | ORAL_TABLET | Freq: Every evening | ORAL | Status: DC | PRN

## 2013-06-02 NOTE — Discharge Summary (Addendum)
Physician Discharge Summary  Tony Boyd QBH:[id number] DOB: 1955/06/01 DOA: 05/31/2013  PCP: Hollace Kinnier, DO  Admit date: 05/31/2013 Discharge date: 06/02/2013  Time spent: 25 minutes  Recommendations for Outpatient Follow-up:  1. Discharge home with outpt PCP follow up in 1 week  Discharge Diagnoses:  Principal Problem:   Chest pain  Active Problems:   Diabetes mellitus type 2, uncontrolled, with complications   Hepatitis C   Cirrhosis   Seizure disorder   Hypothyroidism   Left sided numbness   ETOH abuse   Thrombocytopenia, acquired   Anemia of chronic disease   Chronic back pain   Discharge Condition: fair  Diet recommendation: diabetic  Filed Weights   05/31/13 2221 06/01/13 0150 06/02/13 0000  Weight: 108.863 kg (240 lb) 107.185 kg (236 lb 4.8 oz) 107.008 kg (235 lb 14.6 oz)    History of present illness:  58 y.o. Caucasian male with history of cirrhosis of the liver, alcohol abuse, hepatitis C, seizure disorder, bipolar disorder, pancytopenia, history of GI bleed due to varices, anemia of acute blood loss and chronic disease, type 2 diabetes, and history of hepatic encephalopathy on lactulose, presented with left sided chest pain and left UE numbness for 1 day.  Patient reports that his symptoms started previous evening around 5 p.m. when he was taking a nap. He woke up around 7 p.m. with left-sided numbness and chest pain. He reports feeling sharp chest pain with pain down his left arm. Initially he took the Xanax which helped his symptoms. He received morphine in the emergency department which took away his pain. He denies any pain to his jaw or diaphoresis. He also at the same time had left-sided numbness and tingling. Denies any weakness. His numbness has improved significantly and is only slightly tingling at this time. He did not receive TPA due to improvement in his symptoms. Hospitalist service was asked to admit the patient for further care and management. He  denies any fevers, but admits having chills at home. Complained of nausea earlier today but denies any vomiting. Denies any shortness of breath. Denies any abdominal pain, diarrhea, headaches or vision changes. Patient does admit to drinking alcohol (diluted beer?) socially with his girlfriend.   Hospital Course:  Chest pain  Stable on telemetry. Serial cardiac enzymes negative. EKG unremarkable. 2-D echo shows mild LVH with normal EF of 55%.  Seen by cardiology and is likely noncardiac. recommended no further workup and no outpatient cardiology follow up required.   TIA-like symptoms  Head CT unremarkable. MRI brain / MRA head negative for any intracranial activity. . No further numbness of his LUE. Stable on telemetry.  Since symptoms are non specific with negative w/up, will not start him on Aspirin especially with low platelets.   Cirrhosis of the liver  Secondary to alcohol abuse and hep C.  Compensated. Continue lactulose and rifaximin  And titrate to at least 3 BMs daily. Patient reports being compliant with meds.  continue Lasix and Aldactone. Continue propranolol.  Counseled strongly on alcohol cessation. Placed on  thiamine, folate and multivitamin. No signs of withdrawal.  No sign or symptom of encephalopathy.  Pancytopenia  Secondary to cirrhosis Monitor as outpt  Type 2 diabetes mellitus   resume home dose insulin   Hypothyroidism  Continue Synthroid   Hx of seizure dx  continue keppra   Family Communication: None at bedside  Code Status: Full code   Disposition Plan: Home with PCP follow up Consultants:  Cardiology Procedures:  None Antibiotics:  None  Discharge Exam: Filed Vitals:   06/02/13 0801  BP: 114/60  Pulse: 72  Temp: 97.9 F (36.6 C)  Resp: 20   General: Middle aged obese male in no acute distress  HEENT: No pallor, moist oral mucosa  Cardiovascular: Normal [id number], normal metabolic gallop  Chest: Clear to auscultation bilaterally, no added  sounds  Abdomen: Soft, mildly distended, nontender, bowel sounds present  Extremities: Warm, no edema  CNS: AAO x3   Discharge Instructions You were cared for by a hospitalist during your hospital stay. If you have any questions about your discharge medications or the care you received while you were in the hospital after you are discharged, you can call the unit and asked to speak with the hospitalist on call if the hospitalist that took care of you is not available. Once you are discharged, your primary care physician will handle any further medical issues. Please note that NO REFILLS for any discharge medications will be authorized once you are discharged, as it is imperative that you return to your primary care physician (or establish a relationship with a primary care physician if you do not have one) for your aftercare needs so that they can reassess your need for medications and monitor your lab values.     Medication List         ALPRAZolam 1 MG tablet  Commonly known as:  XANAX  Take 1 tablet (1 mg total) by mouth at bedtime as needed for anxiety.     ferrous sulfate 325 (65 FE) MG tablet  Take 1 tablet (325 mg total) by mouth at bedtime.     folic acid 1 MG tablet  Commonly known as:  FOLVITE  Take 1 tablet (1 mg total) by mouth every morning.     furosemide 40 MG tablet  Commonly known as:  LASIX  Take 1 tablet (40 mg total) by mouth 2 (two) times daily.     insulin aspart 100 UNIT/ML injection  Commonly known as:  novoLOG  Inject 5 Units into the skin 3 (three) times daily before meals.     insulin glargine 100 UNIT/ML injection  Commonly known as:  LANTUS  Inject 0.12 mLs (12 Units total) into the skin at bedtime.     lactulose 10 GM/15ML solution  Commonly known as:  CHRONULAC  Take 67.5 mLs (45 g total) by mouth 3 (three) times daily. Can take upto 5 times a day, titrate for 2-3 soft stools per day     levETIRAcetam 500 MG tablet  Commonly known as:  KEPPRA   Take 2 tablets (1,000 mg total) by mouth 2 (two) times daily.     levothyroxine 75 MCG tablet  Commonly known as:  SYNTHROID, LEVOTHROID  Take 75 mcg by mouth daily before breakfast.     multivitamin with minerals Tabs tablet  Take 1 tablet by mouth every morning.     nadolol 40 MG tablet  Commonly known as:  CORGARD  Take 1 tablet (40 mg total) by mouth every morning.     omeprazole 40 MG capsule  Commonly known as:  PRILOSEC  Take 1 capsule (40 mg total) by mouth daily.     ondansetron 4 MG tablet  Commonly known as:  ZOFRAN  Take 1 tablet (4 mg total) by mouth every 8 (eight) hours as needed for nausea or vomiting.     Oxycodone HCl 10 MG Tabs  Take 1 tablet (10 mg total) by mouth 3 (three) times daily.  Takes 30mg  in am, 10mg  around lunch, 10mg  at bedtime     PARoxetine 20 MG tablet  Commonly known as:  PAXIL  Take 1 tablet (20 mg total) by mouth daily.     potassium chloride SA 20 MEQ tablet  Commonly known as:  K-DUR,KLOR-CON  Take 20 mEq by mouth 2 (two) times daily.     rifaximin 550 MG Tabs tablet  Commonly known as:  XIFAXAN  Take 1 tablet (550 mg total) by mouth 2 (two) times daily.     risperiDONE 0.25 MG tablet  Commonly known as:  RISPERDAL  Take 1 tablet (0.25 mg total) by mouth 2 (two) times daily.     spironolactone 100 MG tablet  Commonly known as:  ALDACTONE  Take 2 tablets (200 mg total) by mouth every morning.       Allergies  Allergen Reactions  . Ativan [Lorazepam] Other (See Comments)    Makes patient very weak and cant mentate appropriately  . Droperidol Other (See Comments)    unknown  . Penicillins Swelling  . Toradol [Ketorolac Tromethamine] Hives       Follow-up Information   Follow up with REED, TIFFANY, DO In 1 week.   Specialty:  Geriatric Medicine   Contact information:   [address] [place] [address] [phone]        The results of significant diagnostics from this hospitalization (including imaging,  microbiology, ancillary and laboratory) are listed below for reference.    Significant Diagnostic Studies: Dg Chest 2 View  06/01/2013   CLINICAL DATA:  Chest pain  EXAM: CHEST  2 VIEW  COMPARISON:  Prior radiograph from 03/23/2013  FINDINGS: Mild cardiomegaly is stable as compared to prior. Mediastinal silhouette within normal limits.  The lungs are normally inflated. Hazy densities overlying the bilateral lung bases are favored through be resultant from overlying soft tissue attenuation. No consolidative opacity seen on lateral projection. No pleural effusion or pulmonary edema is identified. There is no pneumothorax.  No acute osseous abnormality identified.  IMPRESSION: Hazy and patchy densities overlying the bilateral lung bases on frontal projection with no definite correlate on lateral projection, favored to reflect attenuation from overlying soft tissues. Possible infiltrates could be considered in the correct clinical setting, although these are less favored.   Electronically Signed   By: Jeannine Boga M.D.   On: 06/01/2013 00:24   Ct Head Wo Contrast  06/01/2013   CLINICAL DATA:  Left-sided body numbness and pain. Shortness of breath and chest pressure.  EXAM: CT HEAD WITHOUT CONTRAST  TECHNIQUE: Contiguous axial images were obtained from the base of the skull through the vertex without intravenous contrast.  COMPARISON:  CT of the head performed 03/23/2013, and MRI of the brain performed 04/09/2013  FINDINGS: There is no evidence of acute infarction, mass lesion, or intra- or extra-axial hemorrhage on CT.  The posterior fossa, including the cerebellum, brainstem and fourth ventricle, is within normal limits. The third and lateral ventricles, and basal ganglia are unremarkable in appearance. The cerebral hemispheres are symmetric in appearance, with normal gray-white differentiation. No mass effect or midline shift is seen.  There is no evidence of fracture; visualized osseous structures are  unremarkable in appearance. The orbits are within normal limits. There is mild partial opacification of the left maxillary sinus. The remaining paranasal sinuses and mastoid air cells are well-aerated. No significant soft tissue abnormalities are seen.  IMPRESSION: 1. No acute intracranial pathology seen on CT. 2. Mild partial opacification  of the left maxillary sinus.   Electronically Signed   By: Garald Balding M.D.   On: 06/01/2013 00:22   Mri Brain Without Contrast  06/01/2013   CLINICAL DATA:  58 year old male with episode of severe pain radiating to the left upper extremity with numbness and tingling. Initial encounter.  EXAM: MRI HEAD WITHOUT CONTRAST  MRA HEAD WITHOUT CONTRAST  TECHNIQUE: Multiplanar, multiecho pulse sequences of the brain and surrounding structures were obtained without intravenous contrast. Angiographic images of the head were obtained using MRA technique without contrast.  COMPARISON:  Brain MRI 04/09/2013.  FINDINGS: MRI HEAD FINDINGS  Stable cerebral volume. Major intracranial vascular flow voids are preserved. No restricted diffusion to suggest acute infarction. No midline shift, mass effect, evidence of mass lesion, ventriculomegaly, extra-axial collection or acute intracranial hemorrhage. Cervicomedullary junction and pituitary are within normal limits. Negative visualized cervical spine.  Stable gray and white matter signal throughout the brain, including increased intrinsic T1 signal in the basal ganglia. No cerebral edema. Possible small chronic micro hemorrhage in the left posterior frontal lobe is more apparent today with 3 Tesla technique (series 9, image 144).  Visualized orbit soft tissues are within normal limits. Stable paranasal sinuses with small mucous retention cysts in the maxillary alveolar recesses. Stable mild mastoid effusions. Negative visible nasopharynx. Visualized scalp soft tissues are within normal limits. Visible internal auditory structures appear normal.   MRA HEAD FINDINGS  Antegrade flow in the posterior circulation. Mildly dominant distal right vertebral artery. Normal PICA origins. Normal vertebrobasilar junction. Normal AICA origins. No basilar artery stenosis. SCA and PCA origins are normal. Posterior communicating arteries are diminutive or absent. Normal bilateral PCA branches.  Antegrade flow in both ICA siphons. Tortuous distal cervical ICAs, more so the left. Cavernous and proximal supra clinoid ICA irregularity in keeping with atherosclerosis, but no stenosis. Normal ophthalmic artery origins. Normal carotid termini, MCA and ACA origins. Diminutive anterior communicating artery. Visualized ACA branches are tortuous but otherwise normal. Negative visualized bilateral MCA branches.  IMPRESSION: 1. No acute intracranial abnormality. Stable non contrast MRI appearance of the brain including suggestion of increased intrinsic T1 signal in the putamen in keeping with hepatic insufficiency / cirrhosis. Correlation with serum ammonia levels may be valuable if not already done. 2. Mild ICA siphon atherosclerosis, otherwise negative intracranial MRA.   Electronically Signed   By: Lars Pinks M.D.   On: 06/01/2013 19:55   Mr Jodene Nam Head/brain Wo Cm  06/01/2013   CLINICAL DATA:  58 year old male with episode of severe pain radiating to the left upper extremity with numbness and tingling. Initial encounter.  EXAM: MRI HEAD WITHOUT CONTRAST  MRA HEAD WITHOUT CONTRAST  TECHNIQUE: Multiplanar, multiecho pulse sequences of the brain and surrounding structures were obtained without intravenous contrast. Angiographic images of the head were obtained using MRA technique without contrast.  COMPARISON:  Brain MRI 04/09/2013.  FINDINGS: MRI HEAD FINDINGS  Stable cerebral volume. Major intracranial vascular flow voids are preserved. No restricted diffusion to suggest acute infarction. No midline shift, mass effect, evidence of mass lesion, ventriculomegaly, extra-axial collection or  acute intracranial hemorrhage. Cervicomedullary junction and pituitary are within normal limits. Negative visualized cervical spine.  Stable gray and white matter signal throughout the brain, including increased intrinsic T1 signal in the basal ganglia. No cerebral edema. Possible small chronic micro hemorrhage in the left posterior frontal lobe is more apparent today with 3 Tesla technique (series 9, image 144).  Visualized orbit soft tissues are within normal limits. Stable paranasal sinuses  with small mucous retention cysts in the maxillary alveolar recesses. Stable mild mastoid effusions. Negative visible nasopharynx. Visualized scalp soft tissues are within normal limits. Visible internal auditory structures appear normal.  MRA HEAD FINDINGS  Antegrade flow in the posterior circulation. Mildly dominant distal right vertebral artery. Normal PICA origins. Normal vertebrobasilar junction. Normal AICA origins. No basilar artery stenosis. SCA and PCA origins are normal. Posterior communicating arteries are diminutive or absent. Normal bilateral PCA branches.  Antegrade flow in both ICA siphons. Tortuous distal cervical ICAs, more so the left. Cavernous and proximal supra clinoid ICA irregularity in keeping with atherosclerosis, but no stenosis. Normal ophthalmic artery origins. Normal carotid termini, MCA and ACA origins. Diminutive anterior communicating artery. Visualized ACA branches are tortuous but otherwise normal. Negative visualized bilateral MCA branches.  IMPRESSION: 1. No acute intracranial abnormality. Stable non contrast MRI appearance of the brain including suggestion of increased intrinsic T1 signal in the putamen in keeping with hepatic insufficiency / cirrhosis. Correlation with serum ammonia levels may be valuable if not already done. 2. Mild ICA siphon atherosclerosis, otherwise negative intracranial MRA.   Electronically Signed   By: Lars Pinks M.D.   On: 06/01/2013 19:55    Microbiology: No  results found for this or any previous visit (from the past 240 hour(s)).   Labs: Basic Metabolic Panel:  Recent Labs Lab 05/31/13 2330 05/31/13 2338 06/01/13 0340  NA 132* 137 131*  K 4.0 4.1 4.1  CL 95* 94* 96  CO2 29  --  28  GLUCOSE 126* 126* 132*  BUN 11 11 12   CREATININE 0.55 0.80 0.53  CALCIUM 7.6*  --  7.7*   Liver Function Tests:  Recent Labs Lab 05/31/13 2330  AST 68*  ALT 26  ALKPHOS 249*  BILITOT 3.0*  PROT 6.4  ALBUMIN 2.2*   No results found for this basename: LIPASE, AMYLASE,  in the last 168 hours No results found for this basename: AMMONIA,  in the last 168 hours CBC:  Recent Labs Lab 05/31/13 2330 05/31/13 2338 06/01/13 0340  WBC 3.5*  --  2.5*  NEUTROABS 2.2  --   --   HGB 10.1* 10.5* 9.7*  HCT 29.1* 31.0* 28.5*  MCV 107.4*  --  108.0*  PLT 32*  --  24*   Cardiac Enzymes:  Recent Labs Lab 06/01/13 0340 06/01/13 0817  TROPONINI <0.30 <0.30   BNP: BNP (last 3 results)  Recent Labs  01/11/13 2055 02/18/13 0234 05/31/13 2330  PROBNP 207.4* 985.8* 304.0*   CBG:  Recent Labs Lab 06/01/13 1242 06/01/13 1650 06/01/13 1953 06/02/13 0723 06/02/13 1108  GLUCAP 182* 162* 170* 87 181*       Signed:  Marcus Groll  [facility] 06/02/2013, 11:58 AM

## 2013-06-02 NOTE — Progress Notes (Signed)
Pt provided with dc instructions and education. Pt verbalized understanding. No questions at this time. IV removed with tip intact. Heart monitor cleaned and returned to front. Dorna Bloom, RN

## 2013-06-04 ENCOUNTER — Ambulatory Visit: Payer: Medicare Other | Admitting: Nurse Practitioner

## 2013-06-10 ENCOUNTER — Ambulatory Visit (INDEPENDENT_AMBULATORY_CARE_PROVIDER_SITE_OTHER): Payer: Medicare Other | Admitting: Nurse Practitioner

## 2013-06-10 ENCOUNTER — Encounter: Payer: Self-pay | Admitting: Nurse Practitioner

## 2013-06-10 VITALS — BP 112/66 | HR 71 | Temp 97.4°F | Resp 10 | Wt 258.0 lb

## 2013-06-10 DIAGNOSIS — R200 Anesthesia of skin: Secondary | ICD-10-CM

## 2013-06-10 DIAGNOSIS — K7682 Hepatic encephalopathy: Secondary | ICD-10-CM

## 2013-06-10 DIAGNOSIS — G40909 Epilepsy, unspecified, not intractable, without status epilepticus: Secondary | ICD-10-CM

## 2013-06-10 DIAGNOSIS — R5383 Other fatigue: Secondary | ICD-10-CM

## 2013-06-10 DIAGNOSIS — R5381 Other malaise: Secondary | ICD-10-CM

## 2013-06-10 DIAGNOSIS — R209 Unspecified disturbances of skin sensation: Secondary | ICD-10-CM

## 2013-06-10 DIAGNOSIS — IMO0002 Reserved for concepts with insufficient information to code with codable children: Secondary | ICD-10-CM

## 2013-06-10 DIAGNOSIS — E039 Hypothyroidism, unspecified: Secondary | ICD-10-CM

## 2013-06-10 DIAGNOSIS — R531 Weakness: Secondary | ICD-10-CM

## 2013-06-10 DIAGNOSIS — K7460 Unspecified cirrhosis of liver: Secondary | ICD-10-CM

## 2013-06-10 DIAGNOSIS — D638 Anemia in other chronic diseases classified elsewhere: Secondary | ICD-10-CM

## 2013-06-10 DIAGNOSIS — K7290 Hepatic failure, unspecified without coma: Secondary | ICD-10-CM

## 2013-06-10 DIAGNOSIS — E118 Type 2 diabetes mellitus with unspecified complications: Secondary | ICD-10-CM

## 2013-06-10 DIAGNOSIS — E1165 Type 2 diabetes mellitus with hyperglycemia: Secondary | ICD-10-CM
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
## 2013-06-10 NOTE — Patient Instructions (Signed)
LASIX should be 40 mg twice a daily or 80 mg once daily  Will get lab work today  Follow up in 3 month

## 2013-06-11 ENCOUNTER — Other Ambulatory Visit: Payer: Self-pay | Admitting: *Deleted

## 2013-06-11 LAB — CBC WITH DIFFERENTIAL
Basophils Absolute: 0 10*3/uL (ref 0.0–0.2)
Basos: 0 %
EOS: 4 %
Eosinophils Absolute: 0.1 10*3/uL (ref 0.0–0.4)
HEMATOCRIT: 32.5 % — AB (ref 37.5–51.0)
Hemoglobin: 11 g/dL — ABNORMAL LOW (ref 12.6–17.7)
IMMATURE GRANS (ABS): 0 10*3/uL (ref 0.0–0.1)
Immature Granulocytes: 0 %
Lymphocytes Absolute: 0.6 10*3/uL — ABNORMAL LOW (ref 0.7–3.1)
Lymphs: 25 %
MCH: 35.5 pg — ABNORMAL HIGH (ref 26.6–33.0)
MCHC: 33.8 g/dL (ref 31.5–35.7)
MCV: 105 fL — ABNORMAL HIGH (ref 79–97)
Monocytes Absolute: 0.2 10*3/uL (ref 0.1–0.9)
Monocytes: 9 %
NEUTROS ABS: 1.5 10*3/uL (ref 1.4–7.0)
NEUTROS PCT: 62 %
Platelets: 43 10*3/uL — CL (ref 150–379)
RBC: 3.1 x10E6/uL — AB (ref 4.14–5.80)
RDW: 15 % (ref 12.3–15.4)
WBC: 2.5 10*3/uL — CL (ref 3.4–10.8)

## 2013-06-11 LAB — COMPREHENSIVE METABOLIC PANEL
A/G RATIO: 0.7 — AB (ref 1.1–2.5)
ALT: 31 IU/L (ref 0–44)
AST: 83 IU/L — ABNORMAL HIGH (ref 0–40)
Albumin: 2.6 g/dL — ABNORMAL LOW (ref 3.5–5.5)
Alkaline Phosphatase: 315 IU/L — ABNORMAL HIGH (ref 39–117)
BUN/Creatinine Ratio: 13 (ref 9–20)
BUN: 9 mg/dL (ref 6–24)
CALCIUM: 7.8 mg/dL — AB (ref 8.7–10.2)
CO2: 27 mmol/L (ref 18–29)
CREATININE: 0.69 mg/dL — AB (ref 0.76–1.27)
Chloride: 95 mmol/L — ABNORMAL LOW (ref 97–108)
GFR calc Af Amer: 121 mL/min/{1.73_m2} (ref >59)
GFR, EST NON AFRICAN AMERICAN: 105 mL/min/{1.73_m2} (ref >59)
GLOBULIN, TOTAL: 3.7 g/dL (ref 1.5–4.5)
Glucose: 134 mg/dL — ABNORMAL HIGH (ref 65–99)
Potassium: 4.2 mmol/L (ref 3.5–5.2)
Sodium: 136 mmol/L (ref 134–144)
Total Bilirubin: 2.3 mg/dL — ABNORMAL HIGH (ref 0.0–1.2)
Total Protein: 6.3 g/dL (ref 6.0–8.5)

## 2013-06-11 LAB — AMMONIA: Ammonia: 266 ug/dL (ref 27–102)

## 2013-06-11 MED ORDER — OXYCODONE HCL 10 MG PO TABS: 10.0000 mg | ORAL_TABLET | Freq: Three times a day (TID) | ORAL | Status: DC
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
## 2013-06-18 ENCOUNTER — Ambulatory Visit: Payer: Self-pay | Admitting: Nurse Practitioner

## 2013-06-22 ENCOUNTER — Emergency Department (HOSPITAL_COMMUNITY): Payer: Medicare Other

## 2013-06-22 ENCOUNTER — Encounter (HOSPITAL_COMMUNITY): Payer: Self-pay | Admitting: Emergency Medicine

## 2013-06-22 ENCOUNTER — Inpatient Hospital Stay (HOSPITAL_COMMUNITY)
Admission: EM | Admit: 2013-06-22 | Discharge: 2013-06-26 | DRG: 292 | Disposition: A | Discharge status: Home / Self Care | Payer: Medicare Other | Attending: Internal Medicine | Admitting: Internal Medicine

## 2013-06-22 DIAGNOSIS — E118 Type 2 diabetes mellitus with unspecified complications: Secondary | ICD-10-CM

## 2013-06-22 DIAGNOSIS — E43 Unspecified severe protein-calorie malnutrition: Secondary | ICD-10-CM

## 2013-06-22 DIAGNOSIS — R531 Weakness: Secondary | ICD-10-CM

## 2013-06-22 DIAGNOSIS — R188 Other ascites: Secondary | ICD-10-CM

## 2013-06-22 DIAGNOSIS — Z794 Long term (current) use of insulin: Secondary | ICD-10-CM

## 2013-06-22 DIAGNOSIS — M549 Dorsalgia, unspecified: Secondary | ICD-10-CM

## 2013-06-22 DIAGNOSIS — IMO0002 Reserved for concepts with insufficient information to code with codable children: Secondary | ICD-10-CM | POA: Diagnosis present

## 2013-06-22 DIAGNOSIS — Z91199 Patient's noncompliance with other medical treatment and regimen due to unspecified reason: Secondary | ICD-10-CM

## 2013-06-22 DIAGNOSIS — W19XXXA Unspecified fall, initial encounter: Secondary | ICD-10-CM

## 2013-06-22 DIAGNOSIS — R0602 Shortness of breath: Secondary | ICD-10-CM | POA: Diagnosis present

## 2013-06-22 DIAGNOSIS — R609 Edema, unspecified: Secondary | ICD-10-CM

## 2013-06-22 DIAGNOSIS — D638 Anemia in other chronic diseases classified elsewhere: Secondary | ICD-10-CM

## 2013-06-22 DIAGNOSIS — K7682 Hepatic encephalopathy: Secondary | ICD-10-CM

## 2013-06-22 DIAGNOSIS — I5033 Acute on chronic diastolic (congestive) heart failure: Principal | ICD-10-CM | POA: Diagnosis present

## 2013-06-22 DIAGNOSIS — R4182 Altered mental status, unspecified: Secondary | ICD-10-CM

## 2013-06-22 DIAGNOSIS — N5089 Other specified disorders of the male genital organs: Secondary | ICD-10-CM

## 2013-06-22 DIAGNOSIS — K7030 Alcoholic cirrhosis of liver without ascites: Secondary | ICD-10-CM | POA: Diagnosis present

## 2013-06-22 DIAGNOSIS — R601 Generalized edema: Secondary | ICD-10-CM | POA: Diagnosis present

## 2013-06-22 DIAGNOSIS — I8510 Secondary esophageal varices without bleeding: Secondary | ICD-10-CM | POA: Diagnosis present

## 2013-06-22 DIAGNOSIS — G92 Toxic encephalopathy: Secondary | ICD-10-CM

## 2013-06-22 DIAGNOSIS — G8929 Other chronic pain: Secondary | ICD-10-CM

## 2013-06-22 DIAGNOSIS — M129 Arthropathy, unspecified: Secondary | ICD-10-CM | POA: Diagnosis present

## 2013-06-22 DIAGNOSIS — F1020 Alcohol dependence, uncomplicated: Secondary | ICD-10-CM | POA: Diagnosis present

## 2013-06-22 DIAGNOSIS — K920 Hematemesis: Secondary | ICD-10-CM

## 2013-06-22 DIAGNOSIS — R200 Anesthesia of skin: Secondary | ICD-10-CM

## 2013-06-22 DIAGNOSIS — Z9119 Patient's noncompliance with other medical treatment and regimen: Secondary | ICD-10-CM

## 2013-06-22 DIAGNOSIS — E1165 Type 2 diabetes mellitus with hyperglycemia: Secondary | ICD-10-CM

## 2013-06-22 DIAGNOSIS — D696 Thrombocytopenia, unspecified: Secondary | ICD-10-CM | POA: Diagnosis present

## 2013-06-22 DIAGNOSIS — K922 Gastrointestinal hemorrhage, unspecified: Secondary | ICD-10-CM

## 2013-06-22 DIAGNOSIS — E039 Hypothyroidism, unspecified: Secondary | ICD-10-CM | POA: Diagnosis present

## 2013-06-22 DIAGNOSIS — I509 Heart failure, unspecified: Secondary | ICD-10-CM | POA: Diagnosis present

## 2013-06-22 DIAGNOSIS — E440 Moderate protein-calorie malnutrition: Secondary | ICD-10-CM

## 2013-06-22 DIAGNOSIS — R079 Chest pain, unspecified: Secondary | ICD-10-CM

## 2013-06-22 DIAGNOSIS — K219 Gastro-esophageal reflux disease without esophagitis: Secondary | ICD-10-CM | POA: Diagnosis present

## 2013-06-22 DIAGNOSIS — E1142 Type 2 diabetes mellitus with diabetic polyneuropathy: Secondary | ICD-10-CM | POA: Diagnosis present

## 2013-06-22 DIAGNOSIS — N189 Chronic kidney disease, unspecified: Secondary | ICD-10-CM | POA: Diagnosis present

## 2013-06-22 DIAGNOSIS — I8500 Esophageal varices without bleeding: Secondary | ICD-10-CM | POA: Diagnosis present

## 2013-06-22 DIAGNOSIS — G40909 Epilepsy, unspecified, not intractable, without status epilepticus: Secondary | ICD-10-CM | POA: Diagnosis present

## 2013-06-22 DIAGNOSIS — E1149 Type 2 diabetes mellitus with other diabetic neurological complication: Secondary | ICD-10-CM | POA: Diagnosis present

## 2013-06-22 DIAGNOSIS — K7460 Unspecified cirrhosis of liver: Secondary | ICD-10-CM | POA: Diagnosis present

## 2013-06-22 DIAGNOSIS — F319 Bipolar disorder, unspecified: Secondary | ICD-10-CM | POA: Diagnosis present

## 2013-06-22 DIAGNOSIS — F1010 Alcohol abuse, uncomplicated: Secondary | ICD-10-CM

## 2013-06-22 DIAGNOSIS — E876 Hypokalemia: Secondary | ICD-10-CM | POA: Diagnosis present

## 2013-06-22 DIAGNOSIS — Z6841 Body Mass Index (BMI) 40.0 and over, adult: Secondary | ICD-10-CM

## 2013-06-22 DIAGNOSIS — G928 Other toxic encephalopathy: Secondary | ICD-10-CM

## 2013-06-22 DIAGNOSIS — B1920 Unspecified viral hepatitis C without hepatic coma: Secondary | ICD-10-CM

## 2013-06-22 DIAGNOSIS — F411 Generalized anxiety disorder: Secondary | ICD-10-CM | POA: Diagnosis present

## 2013-06-22 DIAGNOSIS — D6959 Other secondary thrombocytopenia: Secondary | ICD-10-CM | POA: Diagnosis present

## 2013-06-22 DIAGNOSIS — K7290 Hepatic failure, unspecified without coma: Secondary | ICD-10-CM

## 2013-06-22 DIAGNOSIS — Z9049 Acquired absence of other specified parts of digestive tract: Secondary | ICD-10-CM

## 2013-06-22 DIAGNOSIS — Z87891 Personal history of nicotine dependence: Secondary | ICD-10-CM

## 2013-06-22 DIAGNOSIS — E669 Obesity, unspecified: Secondary | ICD-10-CM | POA: Diagnosis present

## 2013-06-22 LAB — CBC WITH DIFFERENTIAL/PLATELET
Basophils Absolute: 0 10*3/uL (ref 0.0–0.1)
Basophils Relative: 0 % (ref 0–1)
Eosinophils Absolute: 0.1 10*3/uL (ref 0.0–0.7)
Eosinophils Relative: 3 % (ref 0–5)
HCT: 31.7 % — ABNORMAL LOW (ref 39.0–52.0)
Hemoglobin: 11 g/dL — ABNORMAL LOW (ref 13.0–17.0)
Lymphocytes Relative: 15 % (ref 12–46)
Lymphs Abs: 0.5 10*3/uL — ABNORMAL LOW (ref 0.7–4.0)
MCH: 36.9 pg — ABNORMAL HIGH (ref 26.0–34.0)
MCHC: 34.7 g/dL (ref 30.0–36.0)
MCV: 106.4 fL — ABNORMAL HIGH (ref 78.0–100.0)
Monocytes Absolute: 0.5 10*3/uL (ref 0.1–1.0)
Monocytes Relative: 12 % (ref 3–12)
Neutro Abs: 2.5 10*3/uL (ref 1.7–7.7)
Neutrophils Relative %: 69 % (ref 43–77)
Platelets: 40 10*3/uL — ABNORMAL LOW (ref 150–400)
RBC: 2.98 MIL/uL — ABNORMAL LOW (ref 4.22–5.81)
RDW: 14.6 % (ref 11.5–15.5)
WBC: 3.6 10*3/uL — ABNORMAL LOW (ref 4.0–10.5)

## 2013-06-22 LAB — COMPREHENSIVE METABOLIC PANEL
ALT: 25 U/L (ref 0–53)
AST: 63 U/L — ABNORMAL HIGH (ref 0–37)
Albumin: 2 g/dL — ABNORMAL LOW (ref 3.5–5.2)
Alkaline Phosphatase: 289 U/L — ABNORMAL HIGH (ref 39–117)
BUN: 7 mg/dL (ref 6–23)
CALCIUM: 8.2 mg/dL — AB (ref 8.4–10.5)
CO2: 33 meq/L — AB (ref 19–32)
CREATININE: 0.5 mg/dL (ref 0.50–1.35)
Chloride: 96 mEq/L (ref 96–112)
GLUCOSE: 151 mg/dL — AB (ref 70–99)
Potassium: 3.7 mEq/L (ref 3.7–5.3)
SODIUM: 135 meq/L — AB (ref 137–147)
Total Bilirubin: 4 mg/dL — ABNORMAL HIGH (ref 0.3–1.2)
Total Protein: 6.2 g/dL (ref 6.0–8.3)

## 2013-06-22 LAB — PHOSPHORUS: Phosphorus: 2.5 mg/dL (ref 2.3–4.6)

## 2013-06-22 LAB — PRO B NATRIURETIC PEPTIDE: Pro B Natriuretic peptide (BNP): 296.3 pg/mL — ABNORMAL HIGH (ref 0–125)

## 2013-06-22 LAB — AMMONIA: Ammonia: 92 umol/L — ABNORMAL HIGH (ref 11–60)

## 2013-06-22 LAB — MAGNESIUM: MAGNESIUM: 1.5 mg/dL (ref 1.5–2.5)

## 2013-06-22 LAB — GLUCOSE, CAPILLARY: Glucose-Capillary: 97 mg/dL (ref 70–99)

## 2013-06-22 MED ORDER — RIFAXIMIN 550 MG PO TABS
550.0000 mg | ORAL_TABLET | Freq: Two times a day (BID) | ORAL | Status: DC
  Administered 2013-06-22 – 2013-06-26 (×8): 550 mg via ORAL
  Filled 2013-06-22 (×9): qty 1

## 2013-06-22 MED ORDER — ALBUTEROL SULFATE HFA 108 (90 BASE) MCG/ACT IN AERS: 1.0000 | INHALATION_SPRAY | Freq: Four times a day (QID) | RESPIRATORY_TRACT | Status: DC | PRN

## 2013-06-22 MED ORDER — INSULIN GLARGINE 100 UNIT/ML ~~LOC~~ SOLN
8.0000 [IU] | Freq: Every day | SUBCUTANEOUS | Status: DC
  Administered 2013-06-22 – 2013-06-25 (×4): 8 [IU] via SUBCUTANEOUS
  Filled 2013-06-22 (×5): qty 0.08

## 2013-06-22 MED ORDER — FOLIC ACID 1 MG PO TABS
1.0000 mg | ORAL_TABLET | Freq: Every morning | ORAL | Status: DC
  Administered 2013-06-23 – 2013-06-26 (×4): 1 mg via ORAL
  Filled 2013-06-22 (×4): qty 1

## 2013-06-22 MED ORDER — OXYCODONE HCL 5 MG PO TABS
15.0000 mg | ORAL_TABLET | Freq: Three times a day (TID) | ORAL | Status: DC
  Administered 2013-06-22 – 2013-06-26 (×11): 15 mg via ORAL
  Filled 2013-06-22 (×11): qty 3

## 2013-06-22 MED ORDER — NADOLOL 40 MG PO TABS
40.0000 mg | ORAL_TABLET | Freq: Every morning | ORAL | Status: DC
  Administered 2013-06-23 – 2013-06-26 (×4): 40 mg via ORAL
  Filled 2013-06-22 (×4): qty 1

## 2013-06-22 MED ORDER — SODIUM CHLORIDE 0.9 % IV SOLN: 250.0000 mL | INTRAVENOUS | Status: DC | PRN

## 2013-06-22 MED ORDER — MORPHINE SULFATE 2 MG/ML IJ SOLN
1.0000 mg | INTRAMUSCULAR | Status: DC | PRN
  Administered 2013-06-23 – 2013-06-26 (×12): 2 mg via INTRAVENOUS
  Filled 2013-06-22 (×13): qty 1

## 2013-06-22 MED ORDER — SODIUM CHLORIDE 0.9 % IJ SOLN
3.0000 mL | Freq: Two times a day (BID) | INTRAMUSCULAR | Status: DC
  Administered 2013-06-22 – 2013-06-25 (×4): 3 mL via INTRAVENOUS

## 2013-06-22 MED ORDER — FUROSEMIDE 10 MG/ML IJ SOLN
20.0000 mg | Freq: Once | INTRAMUSCULAR | Status: AC
  Administered 2013-06-22: 20 mg via INTRAVENOUS
  Filled 2013-06-22: qty 2

## 2013-06-22 MED ORDER — INSULIN ASPART 100 UNIT/ML ~~LOC~~ SOLN
0.0000 [IU] | Freq: Three times a day (TID) | SUBCUTANEOUS | Status: DC
Start: 2013-06-23 — End: 2013-06-26
  Administered 2013-06-23 – 2013-06-25 (×7): 3 [IU] via SUBCUTANEOUS
  Administered 2013-06-25: 5 [IU] via SUBCUTANEOUS
  Administered 2013-06-25: 2 [IU] via SUBCUTANEOUS
  Administered 2013-06-26 (×2): 3 [IU] via SUBCUTANEOUS

## 2013-06-22 MED ORDER — PANTOPRAZOLE SODIUM 40 MG PO TBEC
40.0000 mg | DELAYED_RELEASE_TABLET | Freq: Every day | ORAL | Status: DC
  Administered 2013-06-23 – 2013-06-26 (×4): 40 mg via ORAL
  Filled 2013-06-22 (×4): qty 1

## 2013-06-22 MED ORDER — OXYCODONE HCL 10 MG PO TABS: 15.0000 mg | ORAL_TABLET | Freq: Three times a day (TID) | ORAL | Status: DC

## 2013-06-22 MED ORDER — ALPRAZOLAM 1 MG PO TABS
1.0000 mg | ORAL_TABLET | Freq: Every evening | ORAL | Status: DC | PRN
  Administered 2013-06-23 – 2013-06-24 (×2): 1 mg via ORAL
  Filled 2013-06-22 (×3): qty 1

## 2013-06-22 MED ORDER — FUROSEMIDE 10 MG/ML IJ SOLN: 60.0000 mg | Freq: Three times a day (TID) | INTRAMUSCULAR | Status: DC

## 2013-06-22 MED ORDER — SPIRONOLACTONE 100 MG PO TABS
100.0000 mg | ORAL_TABLET | Freq: Every day | ORAL | Status: DC
  Administered 2013-06-23 – 2013-06-26 (×4): 100 mg via ORAL
  Filled 2013-06-22 (×4): qty 1

## 2013-06-22 MED ORDER — PAROXETINE HCL 20 MG PO TABS
20.0000 mg | ORAL_TABLET | Freq: Every day | ORAL | Status: DC
Start: 2013-06-23 — End: 2013-06-26
  Administered 2013-06-23 – 2013-06-26 (×4): 20 mg via ORAL
  Filled 2013-06-22 (×4): qty 1

## 2013-06-22 MED ORDER — SODIUM CHLORIDE 0.9 % IJ SOLN
3.0000 mL | Freq: Two times a day (BID) | INTRAMUSCULAR | Status: DC
  Administered 2013-06-22 – 2013-06-26 (×8): 3 mL via INTRAVENOUS

## 2013-06-22 MED ORDER — RISPERIDONE 0.25 MG PO TABS
0.2500 mg | ORAL_TABLET | Freq: Two times a day (BID) | ORAL | Status: DC
  Administered 2013-06-22 – 2013-06-26 (×8): 0.25 mg via ORAL
  Filled 2013-06-22 (×9): qty 1

## 2013-06-22 MED ORDER — SODIUM CHLORIDE 0.9 % IJ SOLN: 3.0000 mL | INTRAMUSCULAR | Status: DC | PRN

## 2013-06-22 MED ORDER — ONDANSETRON HCL 4 MG PO TABS: 4.0000 mg | ORAL_TABLET | Freq: Four times a day (QID) | ORAL | Status: DC | PRN

## 2013-06-22 MED ORDER — ADULT MULTIVITAMIN W/MINERALS CH
1.0000 | ORAL_TABLET | Freq: Every morning | ORAL | Status: DC
  Administered 2013-06-23 – 2013-06-26 (×4): 1 via ORAL
  Filled 2013-06-22 (×4): qty 1

## 2013-06-22 MED ORDER — LEVOTHYROXINE SODIUM 75 MCG PO TABS
75.0000 ug | ORAL_TABLET | Freq: Every day | ORAL | Status: DC
  Administered 2013-06-23 – 2013-06-26 (×4): 75 ug via ORAL
  Filled 2013-06-22 (×5): qty 1

## 2013-06-22 MED ORDER — ALBUTEROL SULFATE (2.5 MG/3ML) 0.083% IN NEBU: 2.5000 mg | INHALATION_SOLUTION | Freq: Four times a day (QID) | RESPIRATORY_TRACT | Status: DC | PRN

## 2013-06-22 MED ORDER — ONDANSETRON HCL 4 MG/2ML IJ SOLN: 4.0000 mg | Freq: Four times a day (QID) | INTRAMUSCULAR | Status: DC | PRN

## 2013-06-22 MED ORDER — FERROUS SULFATE 325 (65 FE) MG PO TABS
325.0000 mg | ORAL_TABLET | Freq: Every day | ORAL | Status: DC
  Administered 2013-06-22 – 2013-06-25 (×4): 325 mg via ORAL
  Filled 2013-06-22 (×5): qty 1

## 2013-06-22 MED ORDER — FUROSEMIDE 10 MG/ML IJ SOLN
60.0000 mg | Freq: Three times a day (TID) | INTRAMUSCULAR | Status: DC
  Administered 2013-06-23 – 2013-06-26 (×10): 60 mg via INTRAVENOUS
  Filled 2013-06-22 (×13): qty 6

## 2013-06-22 MED ORDER — LEVETIRACETAM 500 MG PO TABS
1000.0000 mg | ORAL_TABLET | Freq: Two times a day (BID) | ORAL | Status: DC
  Administered 2013-06-22 – 2013-06-26 (×8): 1000 mg via ORAL
  Filled 2013-06-22 (×11): qty 2

## 2013-06-22 MED ORDER — LACTULOSE 10 GM/15ML PO SOLN
40.0000 g | Freq: Every day | ORAL | Status: DC
  Administered 2013-06-22 – 2013-06-26 (×21): 40 g via ORAL
  Filled 2013-06-22 (×30): qty 60

## 2013-06-22 MED ORDER — FUROSEMIDE 10 MG/ML IJ SOLN
40.0000 mg | Freq: Once | INTRAMUSCULAR | Status: AC
  Administered 2013-06-22: 40 mg via INTRAVENOUS
  Filled 2013-06-22: qty 4

## 2013-06-22 NOTE — ED Notes (Signed)
Patient transported to X-ray 
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
## 2013-06-22 NOTE — ED Notes (Signed)
Pt states started having leg/ankle/wrist swelling Thursday then started having scrotal swelling on Friday, swelling noted to all.
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
## 2013-06-22 NOTE — ED Notes (Signed)
Per pt, states increased swelling for about a week-testicle swollen since Sat

## 2013-06-23 ENCOUNTER — Inpatient Hospital Stay (HOSPITAL_COMMUNITY): Payer: Medicare Other

## 2013-06-23 LAB — CBC
HCT: 29.6 % — ABNORMAL LOW (ref 39.0–52.0)
Hemoglobin: 10.2 g/dL — ABNORMAL LOW (ref 13.0–17.0)
MCH: 36.8 pg — AB (ref 26.0–34.0)
MCHC: 34.5 g/dL (ref 30.0–36.0)
MCV: 106.9 fL — AB (ref 78.0–100.0)
PLATELETS: 33 10*3/uL — AB (ref 150–400)
RBC: 2.77 MIL/uL — ABNORMAL LOW (ref 4.22–5.81)
RDW: 14.7 % (ref 11.5–15.5)
WBC: 2.6 10*3/uL — ABNORMAL LOW (ref 4.0–10.5)

## 2013-06-23 LAB — BASIC METABOLIC PANEL
BUN: 7 mg/dL (ref 6–23)
CO2: 34 mEq/L — ABNORMAL HIGH (ref 19–32)
Calcium: 7.9 mg/dL — ABNORMAL LOW (ref 8.4–10.5)
Chloride: 98 mEq/L (ref 96–112)
Creatinine, Ser: 0.51 mg/dL (ref 0.50–1.35)
GFR calc non Af Amer: >90 mL/min (ref >90)
Glucose, Bld: 154 mg/dL — ABNORMAL HIGH (ref 70–99)
Potassium: 3.4 mEq/L — ABNORMAL LOW (ref 3.7–5.3)
Sodium: 137 mEq/L (ref 137–147)

## 2013-06-23 LAB — GLUCOSE, CAPILLARY
Glucose-Capillary: 156 mg/dL — ABNORMAL HIGH (ref 70–99)
Glucose-Capillary: 152 mg/dL — ABNORMAL HIGH (ref 70–99)
Glucose-Capillary: 151 mg/dL — ABNORMAL HIGH (ref 70–99)

## 2013-06-23 LAB — HEMOGLOBIN A1C
Hgb A1c MFr Bld: 5.6 % (ref <5.7)
Mean Plasma Glucose: 114 mg/dL (ref <117)

## 2013-06-23 LAB — TSH: TSH: 4.72 u[IU]/mL — ABNORMAL HIGH (ref 0.350–4.500)

## 2013-06-23 MED ORDER — POTASSIUM CHLORIDE CRYS ER 20 MEQ PO TBCR
40.0000 meq | EXTENDED_RELEASE_TABLET | Freq: Two times a day (BID) | ORAL | Status: AC
  Administered 2013-06-23 (×2): 40 meq via ORAL
  Filled 2013-06-23 (×2): qty 2

## 2013-06-23 NOTE — Care Management Note (Addendum)
    Page 1 of 1   06/26/2013     2:11:20 PM CARE MANAGEMENT NOTE 06/26/2013  Patient:  DARYAN, BUELL   Account Number:  [id number]  Date Initiated:  06/23/2013  Documentation initiated by:  Dessa Phi  Subjective/Objective Assessment:   [address] [place].     Action/Plan:   FROM HOME.HAS PCP,PHARMACY.   Anticipated DC Date:  06/26/2013   Anticipated DC Plan:  [place]  CM consult      Choice offered to / List presented to:  [name]        [place] arranged  HH-1 RN  [place]      [place].   Status of service:  Completed, signed off Medicare Important Message given?   (If response is "NO", the following Medicare IM given date fields will be blank) Date Medicare IM given:  06/26/2013 Date Additional Medicare IM given:  06/26/2013  Discharge Disposition:  [place]  Per UR Regulation:  Reviewed for med. necessity/level of care/duration of stay  If discussed at Long Length of Stay Meetings, dates discussed:    Comments:  06/26/13 Jamille Fisher RN,BSN NCM [phone] [place].TC STEPHAN[place] AWARE OF HHC ORDERS.  06/23/13 Antonetta Clanton RN,BSN NCM [phone] NO ANTICIPATED D/C NEEDS.

## 2013-06-23 NOTE — Progress Notes (Signed)
PROGRESS NOTE  Tony Boyd OVZ:[id number] DOB: Jan 19, 1956 DOA: 06/22/2013 PCP: Hollace Kinnier, DO  HPI: 58 y.o. male with past medical history significant for cirrhosis of the liver, hepatitis C, alcohol abuse, hypothyroidism, bipolar disorders, seizure, thrombocytopenia, GERD, esophageal varices and history of hepatic encephalopathy; came to the hospital complaining of worsening lower extremity swelling and shortness of breath when laying down. Patient also described swelling of his of scrotum (about the size of 2 large grapefruits). Patient denies any chest pain, fever, chills, palpitations, cough, dysuria, melena, hematochezia, nausea, vomiting or hematemesis. Patient endorses that he has been compliant with his medications but reported diet indiscretion multiple times for the last week or so prior to admission, this is the same amount of time that the patient has been experiencing symptoms with a steadily worsening of his condition. Patient received 40 mg IV of Lasix in the ED on [facility] has been called to admit the patient for further evaluation and treatment.  Assessment/Plan: SOB, worsening LE edema and Anasarca  - Patient known to me from last time he was in the hospital in February. It appears that he has gained 70 pounds in the last 2 months. He denies any further alcohol abuse, has one or 2 servings of either beer or wine every other week. some of his weight gain seems to be fluid. - Patient at -1.7 L overnight, continue current diuretic regimen of Lasix 60 mg 3 times daily. Potassium supplementation as needed. -Daily weights, strict intake and output  -Low sodium diet (1.8 g per day)  -Will follow clinical response and adjust diuretic therapy as needed.   Cirrhosis: Worse secondary to diet indiscretion. Patient has been advised to stick to a low-sodium diet  - Will continue spironolactone and will give IV furosemide while inpatient  - Continue rifaximin and lactulose -  Continue beta blocker on PPI for history of esophageal varices.   Esophageal varices without mention of bleeding: No signs of upper. At this moment. As mentioned above will continue beta blocker and PPIs.   Seizure disorder: No seizure activity has been reported. Will continue Keppra.   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 Will avoid heparin products, follow platelets level and use SCDs for DVT prophylaxis.   Hypothyroidism: Will check TSH and continue Synthroid.   Diabetes mellitus type 2, uncontrolled, with complications: Will check hemoglobin A1c, continue Lantus and Start sliding scale insulin.  - Will use modified carbohydrate diet.   Anxiety state, unspecified: Continue Paxil and when necessary Ativan.   Alcohol abuse: Patient reports that he is just socially drinking minimal amount of alcohol and his pain sometimes weeks to months without having any drinks. Patient encouraged to continue permanent abstinence and will use folic acid and multivitamins to supplement thiamine.   Diet: Heart healthy/carb modified Fluids: None DVT Prophylaxis: SCDs  Code Status: Full code Family Communication: Discussed with patient  Disposition Plan: Home when ready  Consultants:  None  Procedures:  None   Antibiotics None  HPI/Subjective: Feeling a little better this morning, appreciates improvement in his breathing and lower extremity swelling.  Objective: Filed Vitals:   06/22/13 1925 06/22/13 2046 06/23/13 0523 06/23/13 0600  BP: 111/61 130/73 113/53   Pulse: 66 69 76   Temp:  97.8 F (36.6 C) 99 F (37.2 C)   TempSrc:  Oral Oral   Resp: 16 18 16    Height:  5\' 8"  (1.727 m)    Weight:  131.044 kg (288 lb 14.4 oz)  129.23 kg (284  lb 14.4 oz)  SpO2: 92% 94% 94%     Intake/Output Summary (Last 24 hours) at 06/23/13 0729 Last data filed at 06/23/13 0524  Gross per 24 hour  Intake      0 ml  Output   1700 ml  Net  -1700 ml   Filed Weights   06/22/13 2046  06/23/13 0600  Weight: 131.044 kg (288 lb 14.4 oz) 129.23 kg (284 lb 14.4 oz)    Exam:   General:  NAD  Cardiovascular: regular rate and rhythm, without MRG  Respiratory: good air movement, clear to auscultation throughout, no wheezing, ronchi or rales  Abdomen: soft, not tender to palpation, positive bowel sounds  MSK: 3+ pitting peripheral edema extending into abdominal wall  Neuro: non focal, no asterixis  Data Reviewed: Basic Metabolic Panel:  Recent Labs Lab 06/22/13 1733 06/22/13 1738 06/23/13 0510  NA 135*  --  137  K 3.7  --  3.4*  CL 96  --  98  CO2 33*  --  34*  GLUCOSE 151*  --  154*  BUN 7  --  7  CREATININE 0.50  --  0.51  CALCIUM 8.2*  --  7.9*  MG  --  1.5  --   PHOS  --  2.5  --    Liver Function Tests:  Recent Labs Lab 06/22/13 1733  AST 63*  ALT 25  ALKPHOS 289*  BILITOT 4.0*  PROT 6.2  ALBUMIN 2.0*    Recent Labs Lab 06/22/13 1738  AMMONIA 92*   CBC:  Recent Labs Lab 06/22/13 1733 06/23/13 0510  WBC 3.6* 2.6*  NEUTROABS 2.5  --   HGB 11.0* 10.2*  HCT 31.7* 29.6*  MCV 106.4* 106.9*  PLT 40* 33*   BNP (last 3 results)  Recent Labs  02/18/13 0234 05/31/13 2330 06/22/13 1738  PROBNP 985.8* 304.0* 296.3*   CBG:  Recent Labs Lab 06/22/13 2152  GLUCAP 97   Studies: Dg Chest 2 View  06/22/2013   CLINICAL DATA:  The fluid retention in the abdomen and also scrotal swelling  EXAM: CHEST  2 VIEW  COMPARISON:  DG CHEST 2 VIEW dated 05/31/2013; IR US GUIDE VASC ACCESS*R* dated 10/13/2012  FINDINGS: The lungs are adequately inflated. The pulmonary interstitial markings are mildly increased. There is a small left pleural effusion . The cardiopericardial silhouette remains enlarged. The pulmonary vascularity is engorged. The trachea is midline. The mediastinum is normal in width.  IMPRESSION: The findings are consistent with congestive heart failure with mild pulmonary interstitial edema and pain small left pleural effusion.    Electronically Signed   By: David  Martinique   On: 06/22/2013 17:46    Scheduled Meds: . ferrous sulfate  325 mg Oral QHS  . folic acid  1 mg Oral q morning - 10a  . furosemide  60 mg Intravenous 3 times per day  . insulin aspart  0-15 Units Subcutaneous TID WC  . insulin glargine  8 Units Subcutaneous QHS  . lactulose  40 g Oral 6 X Daily  . levETIRAcetam  1,000 mg Oral BID  . levothyroxine  75 mcg Oral QAC breakfast  . multivitamin with minerals  1 tablet Oral q morning - 10a  . nadolol  40 mg Oral q morning - 10a  . oxyCODONE  15 mg Oral TID  . pantoprazole  40 mg Oral Daily  . PARoxetine  20 mg Oral Daily  . rifaximin  550 mg Oral BID  . risperiDONE  0.25 mg Oral BID  . sodium chloride  3 mL Intravenous Q12H  . sodium chloride  3 mL Intravenous Q12H  . spironolactone  100 mg Oral Daily   Continuous Infusions:   Principal Problem:   Anasarca Active Problems:   Cirrhosis   Esophageal varices without mention of bleeding   Seizure disorder   Thrombocytopenia, acquired   Hypothyroidism   Diabetes mellitus type 2, uncontrolled, with complications   SOB (shortness of breath)   Anxiety state, unspecified   Time spent: 35  This note has been created with Surveyor, quantity. Any transcriptional errors are unintentional.   Marzetta Board, MD [facility] Pager [phone]. If 7 PM - 7 AM, please contact night-coverage at [email or web address], password [facility] 06/23/2013, 7:29 AM  LOS: 1 day

## 2013-06-24 DIAGNOSIS — R4182 Altered mental status, unspecified: Secondary | ICD-10-CM

## 2013-06-24 DIAGNOSIS — D638 Anemia in other chronic diseases classified elsewhere: Secondary | ICD-10-CM

## 2013-06-24 LAB — CBC
HCT: 31 % — ABNORMAL LOW (ref 39.0–52.0)
HEMOGLOBIN: 10.5 g/dL — AB (ref 13.0–17.0)
MCH: 36.5 pg — ABNORMAL HIGH (ref 26.0–34.0)
MCHC: 33.9 g/dL (ref 30.0–36.0)
MCV: 107.6 fL — AB (ref 78.0–100.0)
Platelets: 36 10*3/uL — ABNORMAL LOW (ref 150–400)
RBC: 2.88 MIL/uL — AB (ref 4.22–5.81)
RDW: 14.9 % (ref 11.5–15.5)
WBC: 2.9 10*3/uL — ABNORMAL LOW (ref 4.0–10.5)

## 2013-06-24 LAB — BASIC METABOLIC PANEL
BUN: 7 mg/dL (ref 6–23)
CO2: 36 meq/L — AB (ref 19–32)
Calcium: 8.3 mg/dL — ABNORMAL LOW (ref 8.4–10.5)
Chloride: 98 mEq/L (ref 96–112)
Creatinine, Ser: 0.62 mg/dL (ref 0.50–1.35)
GFR calc Af Amer: >90 mL/min (ref >90)
GLUCOSE: 163 mg/dL — AB (ref 70–99)
POTASSIUM: 3.8 meq/L (ref 3.7–5.3)
SODIUM: 137 meq/L (ref 137–147)

## 2013-06-24 LAB — GLUCOSE, CAPILLARY
GLUCOSE-CAPILLARY: 204 mg/dL — AB (ref 70–99)
Glucose-Capillary: 172 mg/dL — ABNORMAL HIGH (ref 70–99)
Glucose-Capillary: 169 mg/dL — ABNORMAL HIGH (ref 70–99)
Glucose-Capillary: 174 mg/dL — ABNORMAL HIGH (ref 70–99)
Glucose-Capillary: 144 mg/dL — ABNORMAL HIGH (ref 70–99)

## 2013-06-24 NOTE — Progress Notes (Signed)
Patient ID: Tony Boyd, male   DOB: Nov 27, 1955, 58 y.o.   MRN: [id number] [facility] PROGRESS NOTE  Delmus Warwick JJK:[id number] DOB: 18-Sep-1955 DOA: 06/22/2013 PCP: Hollace Kinnier, DO  Brief narrative: 58 y.o. male with cirrhosis of the liver, hepatitis C, alcohol abuse, hypothyroidism, bipolar disorders, seizure, thrombocytopenia, GERD, esophageal varices and history of hepatic encephalopathy; presented to [facility] ED with worsening lower extremity swelling and shortness of breath, swelling of his of scrotum (about the size of 2 large grapefruits). Patient denied any chest pain, fever, chills, palpitations, cough, dysuria, melena, hematochezia, nausea, vomiting or hematemesis. Patient endorsed that he has been compliant with his medications but reported diet indiscretion multiple times for the last week.  Assessment/Plan:  Anasarca  - secondary to dietary non compliance, progressive liver cirrhosis in the setting of long history of alcohol abuse, malnutrition  - pt currently on Lasix 60 mg IV TID, diuresing well, weight on admission 288 lbs --> 283 lbs this AM - Daily weights, strict intake and output  - Low sodium diet (1.8 g per day)  Acute on chronic diastolic CHF - diuresis as noted above - continue IV Lasix 60 mg IV TID and transition to oral Lasix once clinically indicated  Cirrhosis:  - Worse secondary to diet indiscretion. Patient has been advised to follow low-sodium diet  - Will continue spironolactone and will give IV furosemide while inpatient  - Continue rifaximin and lactulose  - Continue beta blocker on PPI for history of esophageal varices.  Esophageal varices  - stable for now with no bleeding - continue beta blocker and PPIs.  Anemia of chronic disease - avoid all heparin products - SCD's for DVT prophylaxis - CBC in AM Transaminitis - from alcohol use - repeat CMET in AM Hypokalemia - supplemented and WNL this AM Seizure disorder - No seizure activity has been  reported. Will continue Keppra.  Thrombocytopenia, acquired - Secondary to cirrhosis. Will avoid heparin products, follow platelets level and use SCDs for DVT prophylaxis.  Hypothyroidism - continue Synthroid.  Diabetes mellitus type 2 - uncontrolled, with complications - continue Lantus and Start sliding scale insulin.  - Will use modified carbohydrate diet.  Anxiety  - Continue Paxil and when necessary Ativan.  Alcohol abuse:  - Patient reports that he is just socially drinking minimal amount of alcohol  - Patient encouraged to continue permanent abstinence and will use folic acid and multivitamins to supplement thiamine.  Moderate Malnutrition - secondary to alcohol abuse and poor oral intake - will advance diet as pt able to tolerate   Diet: Heart healthy/carb modified  Fluids: None  DVT Prophylaxis: SCDs   Antibiotics:   None Consultants:  None Procedures/Studies: Dg Chest 2 View   06/22/2013  The findings are consistent with congestive heart failure with mild pulmonary interstitial edema and pain small left pleural effusion.   US Abdomen Limited  06/23/2013  No ascites identified.    Code Status: Full Family Communication: Pt at bedside Disposition Plan: Home when medically stable  HPI/Subjective: No events overnight.   Objective: Filed Vitals:   06/23/13 1410 06/23/13 2100 06/24/13 0433 06/24/13 1420  BP: 108/58 118/74 125/60 121/70  Pulse: 72 73 75 77  Temp: 98.4 F (36.9 C) 98.4 F (36.9 C) 98.3 F (36.8 C) 98.1 F (36.7 C)  TempSrc: Oral Oral Oral Oral  Resp: 18 16 12 15   Height:      Weight:   128.459 kg (283 lb 3.2 oz)   SpO2: 91% 94% 92% 96%  Intake/Output Summary (Last 24 hours) at 06/24/13 1633 Last data filed at 06/24/13 1300  Gross per 24 hour  Intake   1000 ml  Output   2650 ml  Net  -1650 ml    Exam:   General:  Pt is alert, follows commands appropriately, not in acute distress  Cardiovascular: Regular rate and rhythm, S1/S2, no  murmurs, no rubs, no gallops  Respiratory: Clear to auscultation bilaterally, bibasilar crackles   Abdomen: Soft, non tender, non distended, bowel sounds present, no guarding  Extremities: +2 bilateral LE pitting edema, pulses DP and PT palpable bilaterally  Neuro: Grossly nonfocal  Data Reviewed: Basic Metabolic Panel:  Recent Labs Lab 06/22/13 1733 06/22/13 1738 06/23/13 0510 06/24/13 0340  NA 135*  --  137 137  K 3.7  --  3.4* 3.8  CL 96  --  98 98  CO2 33*  --  34* 36*  GLUCOSE 151*  --  154* 163*  BUN 7  --  7 7  CREATININE 0.50  --  0.51 0.62  CALCIUM 8.2*  --  7.9* 8.3*  MG  --  1.5  --   --   PHOS  --  2.5  --   --    Liver Function Tests:  Recent Labs Lab 06/22/13 1733  AST 63*  ALT 25  ALKPHOS 289*  BILITOT 4.0*  PROT 6.2  ALBUMIN 2.0*   Recent Labs Lab 06/22/13 1738  AMMONIA 92*   CBC:  Recent Labs Lab 06/22/13 1733 06/23/13 0510 06/24/13 0340  WBC 3.6* 2.6* 2.9*  NEUTROABS 2.5  --   --   HGB 11.0* 10.2* 10.5*  HCT 31.7* 29.6* 31.0*  MCV 106.4* 106.9* 107.6*  PLT 40* 33* 36*    CBG:  Recent Labs Lab 06/23/13 1200 06/23/13 1636 06/23/13 2055 06/24/13 0756 06/24/13 1149  GLUCAP 152* 151* 204* 172* 169*    Scheduled Meds: . ferrous sulfate  325 mg Oral QHS  . folic acid  1 mg Oral q morning - 10a  . furosemide  60 mg Intravenous 3 times per day  . insulin aspart  0-15 Units Subcutaneous TID WC  . insulin glargine  8 Units Subcutaneous QHS  . lactulose  40 g Oral 6 X Daily  . levETIRAcetam  1,000 mg Oral BID  . levothyroxine  75 mcg Oral QAC breakfast  . multivitamin with minerals  1 tablet Oral q morning - 10a  . nadolol  40 mg Oral q morning - 10a  . oxyCODONE  15 mg Oral TID  . pantoprazole  40 mg Oral Daily  . PARoxetine  20 mg Oral Daily  . rifaximin  550 mg Oral BID  . risperiDONE  0.25 mg Oral BID  . sodium chloride  3 mL Intravenous Q12H  . sodium chloride  3 mL Intravenous Q12H  . spironolactone  100 mg Oral  Daily   Continuous Infusions:    Theodis Blaze, MD  [facility] Pager [phone]  If 7PM-7AM, please contact night-coverage [email or web address] Password [facility] 06/24/2013, 4:33 PM   LOS: 2 days

## 2013-06-24 NOTE — Progress Notes (Signed)
Dear Doctor:  This patient has been identified as a candidate for PICC for the following reason (s): poor veins/poor circulatory system (CHF, COPD, emphysema, diabetes, steroid use, IV drug abuse, etc.) If you agree, please write an order for the indicated device. For any questions contact the Vascular Access Team at [phone] if no answer, please leave a message.  Thank you for supporting the early vascular access assessment program. 

## 2013-06-24 NOTE — Clinical Documentation Improvement (Addendum)
Query 1 of 2  Please document the ACUITY AND TYPE of Heart Failure treated this admission in the progress notes and discharge summary.   Query 2 of 2   Height - 5'  8" per CHL  Weight - 288 lbs  14.4 ozs per CHL  BMI - 44 per CHL  Possible Clinical Conditions;   - Morbid Obesity, BMI 44   - Other Condition   - Unable to Clinically Determine   Thank You,  Erling Conte ,RN BSN CCDS Certified Clinical Documentation Specialist:  [place] [employer] Management

## 2013-06-24 NOTE — Progress Notes (Signed)
Patient scrotum is grossly edematous, about the side of a "canteloupe". Pt complains of pain and the area is red and weeping serous fluid. Ambulation is very difficult for the patient. Patient is not able to sit on the commode without increase pain. Scrotal swelling has increase since admission. Pain med given as ordered and barrier cream applied and scrotum is elevated while in bed. [place], RN, BSN, [employer]

## 2013-06-25 LAB — COMPREHENSIVE METABOLIC PANEL
ALK PHOS: 260 U/L — AB (ref 39–117)
ALT: 25 U/L (ref 0–53)
AST: 73 U/L — AB (ref 0–37)
Albumin: 1.9 g/dL — ABNORMAL LOW (ref 3.5–5.2)
BUN: 7 mg/dL (ref 6–23)
CO2: 32 mEq/L (ref 19–32)
Calcium: 8.3 mg/dL — ABNORMAL LOW (ref 8.4–10.5)
Chloride: 97 mEq/L (ref 96–112)
Creatinine, Ser: 0.51 mg/dL (ref 0.50–1.35)
GFR calc non Af Amer: >90 mL/min (ref >90)
GLUCOSE: 161 mg/dL — AB (ref 70–99)
POTASSIUM: 3.9 meq/L (ref 3.7–5.3)
SODIUM: 137 meq/L (ref 137–147)
TOTAL PROTEIN: 5.8 g/dL — AB (ref 6.0–8.3)
Total Bilirubin: 2.5 mg/dL — ABNORMAL HIGH (ref 0.3–1.2)

## 2013-06-25 LAB — GLUCOSE, CAPILLARY
Glucose-Capillary: 147 mg/dL — ABNORMAL HIGH (ref 70–99)
Glucose-Capillary: 213 mg/dL — ABNORMAL HIGH (ref 70–99)
Glucose-Capillary: 197 mg/dL — ABNORMAL HIGH (ref 70–99)
Glucose-Capillary: 196 mg/dL — ABNORMAL HIGH (ref 70–99)

## 2013-06-25 LAB — CBC
HEMATOCRIT: 32.7 % — AB (ref 39.0–52.0)
HEMOGLOBIN: 10.8 g/dL — AB (ref 13.0–17.0)
MCH: 36.1 pg — ABNORMAL HIGH (ref 26.0–34.0)
MCHC: 33 g/dL (ref 30.0–36.0)
MCV: 109.4 fL — AB (ref 78.0–100.0)
Platelets: 41 10*3/uL — ABNORMAL LOW (ref 150–400)
RBC: 2.99 MIL/uL — ABNORMAL LOW (ref 4.22–5.81)
RDW: 15 % (ref 11.5–15.5)
WBC: 2.6 10*3/uL — ABNORMAL LOW (ref 4.0–10.5)

## 2013-06-25 LAB — AMMONIA: Ammonia: 98 umol/L — ABNORMAL HIGH (ref 11–60)

## 2013-06-25 NOTE — ED Provider Notes (Signed)
Medical screening examination/treatment/procedure(s) were performed by non-physician practitioner and as supervising physician I was immediately available for consultation/collaboration.   EKG Interpretation   Date/Time:  Monday June 22 2013 18:54:25 EDT Ventricular Rate:  65 PR Interval:  192 QRS Duration: 93 QT Interval:  503 QTC Calculation: 523 R Axis:   -6 Text Interpretation:  Sinus rhythm Low voltage, extremity and precordial  leads Abnormal T, consider ischemia, lateral leads Prolonged QT interval  ED PHYSICIAN INTERPRETATION AVAILABLE IN [facility] Confirmed by  TEST, Record ([id number]) on 06/24/2013 7:11:51 AM       Virgel Manifold, MD 06/25/13 1346

## 2013-06-25 NOTE — Progress Notes (Signed)
Patient ID: Tony Boyd, male   DOB: 1955/12/05, 58 y.o.   MRN: [id number]  [facility] PROGRESS NOTE  Tony Boyd HCW:[id number] DOB: January 01, 1956 DOA: 06/22/2013 PCP: Hollace Kinnier, DO  Brief narrative:  58 y.o. male with cirrhosis of the liver, hepatitis C, alcohol abuse, hypothyroidism, bipolar disorders, seizure, thrombocytopenia, GERD, esophageal varices and history of hepatic encephalopathy; presented to [facility] ED with worsening lower extremity swelling and shortness of breath, swelling of his of scrotum (about the size of 2 large grapefruits). Patient denied any chest pain, fever, chills, palpitations, cough, dysuria, melena, hematochezia, nausea, vomiting or hematemesis. Patient endorsed that he has been compliant with his medications but reported diet indiscretion multiple times for the last week.   Assessment/Plan:  Anasarca  - secondary to dietary non compliance, progressive liver cirrhosis in the setting of long history of alcohol abuse, malnutrition  - pt currently on Lasix 60 mg IV TID, diuresing well, weight on admission 288 lbs --> 283 lbs --> 274 this AM  - Daily weights, strict intake and output  - Low sodium diet (1.8 g per day)  Acute on chronic diastolic CHF  - diuresis as noted above  - continue IV Lasix 60 mg IV TID and transition to oral Lasix once clinically indicated, possibly in AM Cirrhosis:  - Worse secondary to diet indiscretion. Patient has been advised to follow low-sodium diet  - Will continue spironolactone and will give IV furosemide while inpatient as noted above  - Continue rifaximin and lactulose  - Continue beta blocker on PPI for history of esophageal varices.  Esophageal varices  - stable for now with no bleeding  - continue beta blocker and PPIs.  Anemia of chronic disease  - avoid all heparin products  - SCD's for DVT prophylaxis  - CBC in AM  Transaminitis  - from alcohol use  - repeat CMET in AM  Hypokalemia  - supplemented and WNL this  AM  Seizure disorder  - No seizure activity has been reported. Will continue Keppra.  Thrombocytopenia, acquired  - Secondary to cirrhosis. Will avoid heparin products, follow platelets level and use SCDs for DVT prophylaxis.  Hypothyroidism  - continue Synthroid.  Diabetes mellitus type 2  - uncontrolled, with complications  - continue Lantus and Start sliding scale insulin.  - Will use modified carbohydrate diet.  Anxiety  - Continue Paxil and when necessary Ativan.  Alcohol abuse:  - Patient reports that he is just socially drinking minimal amount of alcohol  - Patient encouraged to continue permanent abstinence and will use folic acid and multivitamins to supplement thiamine.  Moderate Malnutrition  - secondary to alcohol abuse and poor oral intake  - will advance diet as pt able to tolerate   Diet: Heart healthy/carb modified  Fluids: None  DVT Prophylaxis: SCDs   Antibiotics:  None Consultants:  None Procedures/Studies:  Dg Chest 2 View 06/22/2013 The findings are consistent with congestive heart failure with mild pulmonary interstitial edema and pain small left pleural effusion.  US Abdomen Limited 06/23/2013 No ascites identified.   Code Status: Full  Family Communication: Pt at bedside  Disposition Plan: Home when medically stable   HPI/Subjective: No events overnight.   Objective: Filed Vitals:   06/24/13 2218 06/25/13 0410 06/25/13 0624 06/25/13 1504  BP: 132/60  119/56 137/67  Pulse: 72  80 84  Temp: 97.7 F (36.5 C)  98 F (36.7 C) 97.8 F (36.6 C)  TempSrc: Oral  Oral Oral  Resp: 16  16 18  Height:      Weight:  124.4 kg (274 lb 4 oz)    SpO2: 98%  93% 97%    Intake/Output Summary (Last 24 hours) at 06/25/13 1707 Last data filed at 06/25/13 1216  Gross per 24 hour  Intake      0 ml  Output   3350 ml  Net  -3350 ml    Exam:   General:  Pt is alert, follows commands appropriately, not in acute distress  Cardiovascular: Regular rate and  rhythm, no rubs, no gallops  Respiratory: Clear to auscultation bilaterally, bibasilar crackles   Abdomen: Soft, non tender, non distended, bowel sounds present, no guarding  Extremities: +2 bilateral LE pitting edema, pulses DP and PT palpable bilaterally  Neuro: Grossly nonfocal  Data Reviewed: Basic Metabolic Panel:  Recent Labs Lab 06/22/13 1733 06/22/13 1738 06/23/13 0510 06/24/13 0340 06/25/13 0440  NA 135*  --  137 137 137  K 3.7  --  3.4* 3.8 3.9  CL 96  --  98 98 97  CO2 33*  --  34* 36* 32  GLUCOSE 151*  --  154* 163* 161*  BUN 7  --  7 7 7   CREATININE 0.50  --  0.51 0.62 0.51  CALCIUM 8.2*  --  7.9* 8.3* 8.3*  MG  --  1.5  --   --   --   PHOS  --  2.5  --   --   --    Liver Function Tests:  Recent Labs Lab 06/22/13 1733 06/25/13 0440  AST 63* 73*  ALT 25 25  ALKPHOS 289* 260*  BILITOT 4.0* 2.5*  PROT 6.2 5.8*  ALBUMIN 2.0* 1.9*    Recent Labs Lab 06/22/13 1738 06/25/13 0440  AMMONIA 92* 98*   CBC:  Recent Labs Lab 06/22/13 1733 06/23/13 0510 06/24/13 0340 06/25/13 0605  WBC 3.6* 2.6* 2.9* 2.6*  NEUTROABS 2.5  --   --   --   HGB 11.0* 10.2* 10.5* 10.8*  HCT 31.7* 29.6* 31.0* 32.7*  MCV 106.4* 106.9* 107.6* 109.4*  PLT 40* 33* 36* 41*   CBG:  Recent Labs Lab 06/24/13 0756 06/24/13 1149 06/24/13 1743 06/24/13 2212 06/25/13 0732  GLUCAP 172* 169* 174* 144* 147*   Scheduled Meds: . ferrous sulfate  325 mg Oral QHS  . folic acid  1 mg Oral q morning - 10a  . furosemide  60 mg Intravenous 3 times per day  . insulin aspart  0-15 Units Subcutaneous TID WC  . insulin glargine  8 Units Subcutaneous QHS  . lactulose  40 g Oral 6 X Daily  . levETIRAcetam  1,000 mg Oral BID  . levothyroxine  75 mcg Oral QAC breakfast  . multivitamin with minerals  1 tablet Oral q morning - 10a  . nadolol  40 mg Oral q morning - 10a  . oxyCODONE  15 mg Oral TID  . pantoprazole  40 mg Oral Daily  . PARoxetine  20 mg Oral Daily  . rifaximin  550 mg  Oral BID  . risperiDONE  0.25 mg Oral BID  . sodium chloride  3 mL Intravenous Q12H  . sodium chloride  3 mL Intravenous Q12H  . spironolactone  100 mg Oral Daily   Continuous Infusions:  Theodis Blaze, MD  [facility] Pager [phone]  If 7PM-7AM, please contact night-coverage [email or web address] Password [email or web address] 06/25/2013, 5:07 PM   LOS: 3 days

## 2013-06-26 DIAGNOSIS — R188 Other ascites: Secondary | ICD-10-CM

## 2013-06-26 LAB — GLUCOSE, CAPILLARY: Glucose-Capillary: 167 mg/dL — ABNORMAL HIGH (ref 70–99)

## 2013-06-26 LAB — COMPREHENSIVE METABOLIC PANEL
ALBUMIN: 2 g/dL — AB (ref 3.5–5.2)
ALK PHOS: 252 U/L — AB (ref 39–117)
ALT: 23 U/L (ref 0–53)
AST: 63 U/L — AB (ref 0–37)
BILIRUBIN TOTAL: 2.5 mg/dL — AB (ref 0.3–1.2)
BUN: 8 mg/dL (ref 6–23)
CO2: 37 mEq/L — ABNORMAL HIGH (ref 19–32)
Calcium: 8.2 mg/dL — ABNORMAL LOW (ref 8.4–10.5)
Chloride: 96 mEq/L (ref 96–112)
Creatinine, Ser: 0.61 mg/dL (ref 0.50–1.35)
GFR calc Af Amer: >90 mL/min (ref >90)
GFR calc non Af Amer: >90 mL/min (ref >90)
Glucose, Bld: 212 mg/dL — ABNORMAL HIGH (ref 70–99)
POTASSIUM: 3.7 meq/L (ref 3.7–5.3)
Sodium: 138 mEq/L (ref 137–147)
TOTAL PROTEIN: 6 g/dL (ref 6.0–8.3)

## 2013-06-26 LAB — CBC
HEMATOCRIT: 32.1 % — AB (ref 39.0–52.0)
Hemoglobin: 10.9 g/dL — ABNORMAL LOW (ref 13.0–17.0)
MCH: 36.7 pg — AB (ref 26.0–34.0)
MCHC: 34 g/dL (ref 30.0–36.0)
MCV: 108.1 fL — AB (ref 78.0–100.0)
PLATELETS: 36 10*3/uL — AB (ref 150–400)
RBC: 2.97 MIL/uL — ABNORMAL LOW (ref 4.22–5.81)
RDW: 15.3 % (ref 11.5–15.5)
WBC: 3 10*3/uL — ABNORMAL LOW (ref 4.0–10.5)

## 2013-06-26 LAB — AMMONIA: Ammonia: 62 umol/L — ABNORMAL HIGH (ref 11–60)

## 2013-06-26 MED ORDER — FUROSEMIDE 40 MG PO TABS: 60.0000 mg | ORAL_TABLET | Freq: Three times a day (TID) | ORAL | Status: DC

## 2013-06-26 MED ORDER — ALPRAZOLAM 1 MG PO TABS: 1.0000 mg | ORAL_TABLET | Freq: Every evening | ORAL | Status: DC | PRN

## 2013-06-26 MED ORDER — OXYCODONE HCL 15 MG PO TABS: 15.0000 mg | ORAL_TABLET | ORAL | Status: DC | PRN

## 2013-06-26 NOTE — Progress Notes (Signed)
Discharged to home, daughter at bedside. D/c instructions and follow up appointment done and given to the daughter. PIV removed no s/s of infiltration or swelling.

## 2013-06-26 NOTE — Discharge Summary (Signed)
Physician Discharge Summary  Tony Boyd HQI:[id number] DOB: 05/15/1955 DOA: 06/22/2013  PCP: Hollace Kinnier, DO  Admit date: 06/22/2013 Discharge date: 06/26/2013  Recommendations for Outpatient Follow-up:  1. Pt will need to follow up with PCP in 2-3 weeks post discharge 2. Please obtain BMP to evaluate electrolytes and kidney function 3. Please also check CBC to evaluate Hg and Hct levels 4. Please note that pt insisting on going home as his girlfriend is coming into the town   Discharge Diagnoses: Anasarca  Principal Problem:   Anasarca Active Problems:   Cirrhosis   Esophageal varices without mention of bleeding   Seizure disorder   Thrombocytopenia, acquired   Hypothyroidism   Diabetes mellitus type 2, uncontrolled, with complications   SOB (shortness of breath)   Anxiety state, unspecified  Discharge Condition: Stable  Diet recommendation: Heart healthy diet discussed in details   Brief narrative:  58 y.o. male with cirrhosis of the liver, hepatitis C, alcohol abuse, hypothyroidism, bipolar disorders, seizure, thrombocytopenia, GERD, esophageal varices and history of hepatic encephalopathy; presented to [facility] ED with worsening lower extremity swelling and shortness of breath, swelling of his of scrotum (about the size of 2 large grapefruits). Patient denied any chest pain, fever, chills, palpitations, cough, dysuria, melena, hematochezia, nausea, vomiting or hematemesis. Patient endorsed that he has been compliant with his medications but reported diet indiscretion multiple times for the last week.   Assessment/Plan:  Anasarca  - secondary to dietary non compliance, progressive liver cirrhosis in the setting of long history of alcohol abuse, malnutrition  - pt currently on Lasix 60 mg IV TID, diuresing well, weight on admission 288 lbs --> 283 lbs --> 274 this AM  - Daily weights, strict intake and output monitored, pt wants to go home today - Low sodium diet (1.8 g per day)  recommended and pt verbalized understanding  Acute on chronic diastolic CHF  - diuresis as noted above  - continued IV Lasix 60 mg IV TID and transition to oral Lasix today as pt insisting on going home  - will need close follow up and volume status evaluation Cirrhosis:  - Worse secondary to diet indiscretion. Patient has been advised to follow low-sodium diet  - continued spironolactone and furosemide as noted above  - Continue rifaximin and lactulose  - Continue beta blocker on PPI for history of esophageal varices.  Esophageal varices  - stable for now with no bleeding  - continue beta blocker and PPIs.  Anemia of chronic disease  - avoid all heparin products  - SCD's for DVT prophylaxis  Transaminitis  - from alcohol use  Hypokalemia  - supplemented and WNL this AM  Seizure disorder  - No seizure activity has been reported. Will continue Keppra.  Thrombocytopenia, acquired  - Secondary to cirrhosis. Will avoid heparin products, follow platelets level and use SCDs for DVT prophylaxis.  Hypothyroidism  - continue Synthroid.  Diabetes mellitus type 2  - uncontrolled, with complications  - continue Lantus and Start sliding scale insulin.  - Will use modified carbohydrate diet.  Anxiety  - Continue Paxil and when necessary Ativan.  Alcohol abuse:  - Patient reports that he is just socially drinking minimal amount of alcohol  - Patient encouraged to continue permanent abstinence and will use folic acid and multivitamins to supplement thiamine.  Moderate Malnutrition  - secondary to alcohol abuse and poor oral intake  - pt tolerating current diet well  Diet: Heart healthy/carb modified  Fluids: None  DVT Prophylaxis: SCDs  Antibiotics:  None Consultants:  None Procedures/Studies:  Dg Chest 2 View 06/22/2013 The findings are consistent with congestive heart failure with mild pulmonary interstitial edema and pain small left pleural effusion.  US Abdomen Limited 06/23/2013  No ascites identified.   Code Status: Full  Family Communication: Pt at bedside   Discharge Exam: Filed Vitals:   06/26/13 0436  BP: 122/57  Pulse: 78  Temp: 98.2 F (36.8 C)  Resp: 18   Filed Vitals:   06/25/13 0624 06/25/13 1504 06/25/13 2048 06/26/13 0436  BP: 119/56 137/67 114/57 122/57  Pulse: 80 84 78 78  Temp: 98 F (36.7 C) 97.8 F (36.6 C) 98.8 F (37.1 C) 98.2 F (36.8 C)  TempSrc: Oral Oral Oral Oral  Resp: 16 18 18 18   Height:      Weight:    121.791 kg (268 lb 8 oz)  SpO2: 93% 97% 95% 97%    General: Pt is alert, follows commands appropriately, not in acute distress Cardiovascular: Regular rate and rhythm, S1/S2 +, no murmurs, no rubs, no gallops Respiratory: Clear to auscultation bilaterally, no wheezing, crackles at bases Abdominal: Soft, non tender, non distended, bowel sounds +, no guarding Extremities: + 2 bilateral LE pitting edema, no cyanosis, pulses palpable bilaterally DP and PT Neuro: Grossly nonfocal  Discharge Instructions  Discharge Orders   Future Appointments Provider Department Dept Phone   07/23/2013 2:45 PM Pricilla Larsson, NP [place] [phone]   09/09/2013 3:30 PM Pricilla Larsson, NP [facility] [phone]   Future Orders Complete By Expires   Diet - low sodium heart healthy  As directed    Increase activity slowly  As directed        Medication List         albuterol 108 (90 BASE) MCG/ACT inhaler  Commonly known as:  PROVENTIL HFA;VENTOLIN HFA  Inhale 1-2 puffs into the lungs every 6 (six) hours as needed for wheezing or shortness of breath.     ALPRAZolam 1 MG tablet  Commonly known as:  XANAX  Take 1 tablet (1 mg total) by mouth at bedtime as needed for anxiety.     ferrous sulfate 325 (65 FE) MG tablet  Take 1 tablet (325 mg total) by mouth at bedtime.     folic acid 1 MG tablet  Commonly known as:  FOLVITE  Take 1 tablet (1 mg total) by mouth every morning.     furosemide 40 MG tablet   Commonly known as:  LASIX  Take 1.5 tablets (60 mg total) by mouth 3 (three) times daily.     insulin glargine 100 UNIT/ML injection  Commonly known as:  LANTUS  Inject 0.12 mLs (12 Units total) into the skin at bedtime.     lactulose 10 GM/15ML solution  Commonly known as:  CHRONULAC  Take 40 g by mouth 6 (six) times daily.     levETIRAcetam 500 MG tablet  Commonly known as:  KEPPRA  Take 2 tablets (1,000 mg total) by mouth 2 (two) times daily.     levothyroxine 75 MCG tablet  Commonly known as:  SYNTHROID, LEVOTHROID  Take 75 mcg by mouth daily before breakfast.     multivitamin with minerals Tabs tablet  Take 1 tablet by mouth every morning.     nadolol 40 MG tablet  Commonly known as:  CORGARD  Take 1 tablet (40 mg total) by mouth every morning.     omeprazole 40 MG capsule  Commonly known as:  PRILOSEC  Take 1 capsule (40 mg total) by mouth daily.     ondansetron 4 MG tablet  Commonly known as:  ZOFRAN  Take 1 tablet (4 mg total) by mouth every 8 (eight) hours as needed for nausea or vomiting.     oxyCODONE 15 MG immediate release tablet  Commonly known as:  ROXICODONE  Take 1 tablet (15 mg total) by mouth every 4 (four) hours as needed for severe pain.     PARoxetine 20 MG tablet  Commonly known as:  PAXIL  Take 1 tablet (20 mg total) by mouth daily.     rifaximin 550 MG Tabs tablet  Commonly known as:  XIFAXAN  Take 1 tablet (550 mg total) by mouth 2 (two) times daily.     risperiDONE 0.25 MG tablet  Commonly known as:  RISPERDAL  Take 1 tablet (0.25 mg total) by mouth 2 (two) times daily.     spironolactone 100 MG tablet  Commonly known as:  ALDACTONE  Take 2 tablets (200 mg total) by mouth every morning.           Follow-up Information   Schedule an appointment as soon as possible for a visit with REED, TIFFANY, DO.   Specialty:  Geriatric Medicine   Contact information:   [address] [place] [address] [phone]       Follow up  with Faye Ramsay, MD.   Specialty:  Internal Medicine   Contact information:   [address] [place] [address] [phone]        The results of significant diagnostics from this hospitalization (including imaging, microbiology, ancillary and laboratory) are listed below for reference.     Microbiology: No results found for this or any previous visit (from the past 240 hour(s)).   Labs: Basic Metabolic Panel:  Recent Labs Lab 06/22/13 1733 06/22/13 1738 06/23/13 0510 06/24/13 0340 06/25/13 0440 06/26/13 0400  NA 135*  --  137 137 137 138  K 3.7  --  3.4* 3.8 3.9 3.7  CL 96  --  98 98 97 96  CO2 33*  --  34* 36* 32 37*  GLUCOSE 151*  --  154* 163* 161* 212*  BUN 7  --  7 7 7 8   CREATININE 0.50  --  0.51 0.62 0.51 0.61  CALCIUM 8.2*  --  7.9* 8.3* 8.3* 8.2*  MG  --  1.5  --   --   --   --   PHOS  --  2.5  --   --   --   --    Liver Function Tests:  Recent Labs Lab 06/22/13 1733 06/25/13 0440 06/26/13 0400  AST 63* 73* 63*  ALT 25 25 23   ALKPHOS 289* 260* 252*  BILITOT 4.0* 2.5* 2.5*  PROT 6.2 5.8* 6.0  ALBUMIN 2.0* 1.9* 2.0*    Recent Labs Lab 06/22/13 1738 06/25/13 0440 06/26/13 0400  AMMONIA 92* 98* 62*   CBC:  Recent Labs Lab 06/22/13 1733 06/23/13 0510 06/24/13 0340 06/25/13 0605 06/26/13 0400  WBC 3.6* 2.6* 2.9* 2.6* 3.0*  NEUTROABS 2.5  --   --   --   --   HGB 11.0* 10.2* 10.5* 10.8* 10.9*  HCT 31.7* 29.6* 31.0* 32.7* 32.1*  MCV 106.4* 106.9* 107.6* 109.4* 108.1*  PLT 40* 33* 36* 41* 36*  BNP: BNP (last 3 results)  Recent Labs  02/18/13 0234 05/31/13 2330 06/22/13 1738  PROBNP 985.8* 304.0* 296.3*   CBG:  Recent Labs Lab 06/25/13 0732 06/25/13 1152 06/25/13 1747 06/25/13 2212 06/26/13 0735  GLUCAP 147* 213* 197* 196* 167*     SIGNED: Time coordinating discharge: Over 30 minutes  Theodis Blaze, MD  [facility] 06/26/2013, 12:24 PM Pager [phone]  If 7PM-7AM, please contact  night-coverage [email or web address] Password [email or web address]

## 2013-06-26 NOTE — Discharge Instructions (Signed)
Heart Failure °Heart failure is a condition in which the heart has trouble pumping blood. This means your heart does not pump blood efficiently for your body to work well. In some cases of heart failure, fluid may back up into your lungs or you may have swelling (edema) in your lower legs. Heart failure is usually a long-term (chronic) condition. It is important for you to take good care of yourself and follow your caregiver's treatment plan. °CAUSES  °Some health conditions can cause heart failure. Those health conditions include: °· High blood pressure (hypertension) causes the heart muscle to work harder than normal. When pressure in the blood vessels is high, the heart needs to pump (contract) with more force in order to circulate blood throughout the body. High blood pressure eventually causes the heart to become stiff and weak. °· Coronary artery disease (CAD) is the buildup of cholesterol and fat (plaque) in the arteries of the heart. The blockage in the arteries deprives the heart muscle of oxygen and blood. This can cause chest pain and may lead to a heart attack. High blood pressure can also contribute to CAD. °· Heart attack (myocardial infarction) occurs when 1 or more arteries in the heart become blocked. The loss of oxygen damages the muscle tissue of the heart. When this happens, part of the heart muscle dies. The injured tissue does not contract as well and weakens the heart's ability to pump blood. °· Abnormal heart valves can cause heart failure when the heart valves do not open and close properly. This makes the heart muscle pump harder to keep the blood flowing. °· Heart muscle disease (cardiomyopathy or myocarditis) is damage to the heart muscle from a variety of causes. These can include drug or alcohol abuse, infections, or unknown reasons. These can increase the risk of heart failure. °· Lung disease makes the heart work harder because the lungs do not work properly. This can cause a strain  on the heart, leading it to fail. °· Diabetes increases the risk of heart failure. High blood sugar contributes to high fat (lipid) levels in the blood. Diabetes can also cause slow damage to tiny blood vessels that carry important nutrients to the heart muscle. When the heart does not get enough oxygen and food, it can cause the heart to become weak and stiff. This leads to a heart that does not contract efficiently. °· Other conditions can contribute to heart failure. These include abnormal heart rhythms, thyroid problems, and low blood counts (anemia). °Certain unhealthy behaviors can increase the risk of heart failure. Those unhealthy behaviors include: °· Being overweight. °· Smoking or chewing tobacco. °· Eating foods high in fat and cholesterol. °· Abusing illicit drugs or alcohol. °· Lacking physical activity. °SYMPTOMS  °Heart failure symptoms may vary and can be hard to detect. Symptoms may include: °· Shortness of breath with activity, such as climbing stairs. °· Persistent cough. °· Swelling of the feet, ankles, legs, or abdomen. °· Unexplained weight gain. °· Difficulty breathing when lying flat (orthopnea). °· Waking from sleep because of the need to sit up and get more air. °· Rapid heartbeat. °· Fatigue and loss of energy. °· Feeling lightheaded, dizzy, or close to fainting. °· Loss of appetite. °· Nausea. °· Increased urination during the night (nocturia). °DIAGNOSIS  °A diagnosis of heart failure is based on your history, symptoms, physical examination, and diagnostic tests. °Diagnostic tests for heart failure may include: °· Echocardiography. °· Electrocardiography. °· Chest X-ray. °· Blood tests. °· Exercise   stress test. °· Cardiac angiography. °· Radionuclide scans. °TREATMENT  °Treatment is aimed at managing the symptoms of heart failure. Medicines, behavioral changes, or surgical intervention may be necessary to treat heart failure. °· Medicines to help treat heart failure may  include: °· Angiotensin-converting enzyme (ACE) inhibitors. This type of medicine blocks the effects of a blood protein called angiotensin-converting enzyme. ACE inhibitors relax (dilate) the blood vessels and help lower blood pressure. °· Angiotensin receptor blockers. This type of medicine blocks the actions of a blood protein called angiotensin. Angiotensin receptor blockers dilate the blood vessels and help lower blood pressure. °· Water pills (diuretics). Diuretics cause the kidneys to remove salt and water from the blood. The extra fluid is removed through urination. This loss of extra fluid lowers the volume of blood the heart pumps. °· Beta blockers. These prevent the heart from beating too fast and improve heart muscle strength. °· Digitalis. This increases the force of the heartbeat. °· Healthy behavior changes include: °· Obtaining and maintaining a healthy weight. °· Stopping smoking or chewing tobacco. °· Eating heart healthy foods. °· Limiting or avoiding alcohol. °· Stopping illicit drug use. °· Physical activity as directed by your caregiver. °· Surgical treatment for heart failure may include: °· A procedure to open blocked arteries, repair damaged heart valves, or remove damaged heart muscle tissue. °· A pacemaker to improve heart muscle function and control certain abnormal heart rhythms. °· An internal cardioverter defibrillator to treat certain serious abnormal heart rhythms. °· A left ventricular assist device to assist the pumping ability of the heart. °HOME CARE INSTRUCTIONS  °· Take your medicine as directed by your caregiver. Medicines are important in reducing the workload of your heart, slowing the progression of heart failure, and improving your symptoms. °· Do not stop taking your medicine unless directed by your caregiver. °· Do not skip any dose of medicine. °· Refill your prescriptions before you run out of medicine. Your medicines are needed every day. °· Take over-the-counter  medicine only as directed by your caregiver or pharmacist. °· Engage in moderate physical activity if directed by your caregiver. Moderate physical activity can benefit some people. The elderly and people with severe heart failure should consult with a caregiver for physical activity recommendations. °· Eat heart healthy foods. Food choices should be free of trans fat and low in saturated fat, cholesterol, and salt (sodium). Healthy choices include fresh or frozen fruits and vegetables, fish, lean meats, legumes, fat-free or low-fat dairy products, and whole grain or high fiber foods. Talk to a dietitian to learn more about heart healthy foods. °· Limit sodium if directed by your caregiver. Sodium restriction may reduce symptoms of heart failure in some people. Talk to a dietitian to learn more about heart healthy seasonings. °· Use healthy cooking methods. Healthy cooking methods include roasting, grilling, broiling, baking, poaching, steaming, or stir-frying. Talk to a dietitian to learn more about healthy cooking methods. °· Limit fluids if directed by your caregiver. Fluid restriction may reduce symptoms of heart failure in some people. °· Weigh yourself every day. Daily weights are important in the early recognition of excess fluid. You should weigh yourself every morning after you urinate and before you eat breakfast. Wear the same amount of clothing each time you weigh yourself. Record your daily weight. Provide your caregiver with your weight record. °· Monitor and record your blood pressure if directed by your caregiver. °· Check your pulse if directed by your caregiver. °· Lose weight if directed   by your caregiver. Weight loss may reduce symptoms of heart failure in some people. °· Stop smoking or chewing tobacco. Nicotine makes your heart work harder by causing your blood vessels to constrict. Do not use nicotine gum or patches before talking to your caregiver. °· Schedule and attend follow-up visits as  directed by your caregiver. It is important to keep all your appointments. °· Limit alcohol intake to no more than 1 drink per day for nonpregnant women and 2 drinks per day for men. Drinking more than that is harmful to your heart. Tell your caregiver if you drink alcohol several times a week. Talk with your caregiver about whether alcohol is safe for you. If your heart has already been damaged by alcohol or you have severe heart failure, drinking alcohol should be stopped completely. °· Stop illicit drug use. °· Stay up-to-date with immunizations. It is especially important to prevent respiratory infections through current pneumococcal and influenza immunizations. °· Manage other health conditions such as hypertension, diabetes, thyroid disease, or abnormal heart rhythms as directed by your caregiver. °· Learn to manage stress. °· Plan rest periods when fatigued. °· Learn strategies to manage high temperatures. If the weather is extremely hot: °· Avoid vigorous physical activity. °· Use air conditioning or fans or seek a cooler location. °· Avoid caffeine and alcohol. °· Wear loose-fitting, lightweight, and light-colored clothing. °· Learn strategies to manage cold temperatures. If the weather is extremely cold: °· Avoid vigorous physical activity. °· Layer clothes. °· Wear mittens or gloves, a hat, and a scarf when going outside. °· Avoid alcohol. °· Obtain ongoing education and support as needed. °· Participate or seek rehabilitation as needed to maintain or improve independence and quality of life. °SEEK MEDICAL CARE IF:  °· Your weight increases by 03 lb/1.4 kg in 1 day or 05 lb/2.3 kg in a week. °· You have increasing shortness of breath that is unusual for you. °· You are unable to participate in your usual physical activities. °· You tire easily. °· You cough more than normal, especially with physical activity. °· You have any or more swelling in areas such as your hands, feet, ankles, or abdomen. °· You  are unable to sleep because it is hard to breathe. °· You feel like your heart is beating fast (palpitations). °· You become dizzy or lightheaded upon standing up. °SEEK IMMEDIATE MEDICAL CARE IF:  °· You have difficulty breathing. °· There is a change in mental status such as decreased alertness or difficulty with concentration. °· You have a pain or discomfort in your chest. °· You have an episode of fainting (syncope). °MAKE SURE YOU:  °· Understand these instructions. °· Will watch your condition. °· Will get help right away if you are not doing well or get worse. °Document Released: 02/12/2005 Document Revised: 06/09/2012 Document Reviewed: 03/06/2012 °[employer]® Patient Information ©2014 [employer]. ° °

## 2013-06-29 LAB — GLUCOSE, CAPILLARY: Glucose-Capillary: 191 mg/dL — ABNORMAL HIGH (ref 70–99)

## 2013-06-30 ENCOUNTER — Inpatient Hospital Stay (HOSPITAL_COMMUNITY)
Admission: EM | Admit: 2013-06-30 | Discharge: 2013-07-06 | DRG: 729 | Disposition: A | Discharge status: Home Health | Payer: Medicare Other | Attending: Internal Medicine | Admitting: Internal Medicine

## 2013-06-30 ENCOUNTER — Encounter (HOSPITAL_COMMUNITY): Payer: Self-pay | Admitting: Emergency Medicine

## 2013-06-30 ENCOUNTER — Emergency Department (HOSPITAL_COMMUNITY): Payer: Medicare Other

## 2013-06-30 DIAGNOSIS — Z79899 Other long term (current) drug therapy: Secondary | ICD-10-CM

## 2013-06-30 DIAGNOSIS — IMO0002 Reserved for concepts with insufficient information to code with codable children: Secondary | ICD-10-CM | POA: Diagnosis present

## 2013-06-30 DIAGNOSIS — F411 Generalized anxiety disorder: Secondary | ICD-10-CM

## 2013-06-30 DIAGNOSIS — G928 Other toxic encephalopathy: Secondary | ICD-10-CM

## 2013-06-30 DIAGNOSIS — R200 Anesthesia of skin: Secondary | ICD-10-CM

## 2013-06-30 DIAGNOSIS — K7290 Hepatic failure, unspecified without coma: Secondary | ICD-10-CM

## 2013-06-30 DIAGNOSIS — E118 Type 2 diabetes mellitus with unspecified complications: Secondary | ICD-10-CM

## 2013-06-30 DIAGNOSIS — Z8249 Family history of ischemic heart disease and other diseases of the circulatory system: Secondary | ICD-10-CM

## 2013-06-30 DIAGNOSIS — K319 Disease of stomach and duodenum, unspecified: Secondary | ICD-10-CM | POA: Diagnosis present

## 2013-06-30 DIAGNOSIS — R0602 Shortness of breath: Secondary | ICD-10-CM

## 2013-06-30 DIAGNOSIS — E1142 Type 2 diabetes mellitus with diabetic polyneuropathy: Secondary | ICD-10-CM | POA: Diagnosis present

## 2013-06-30 DIAGNOSIS — M549 Dorsalgia, unspecified: Secondary | ICD-10-CM | POA: Diagnosis present

## 2013-06-30 DIAGNOSIS — Z794 Long term (current) use of insulin: Secondary | ICD-10-CM

## 2013-06-30 DIAGNOSIS — D696 Thrombocytopenia, unspecified: Secondary | ICD-10-CM

## 2013-06-30 DIAGNOSIS — Z801 Family history of malignant neoplasm of trachea, bronchus and lung: Secondary | ICD-10-CM

## 2013-06-30 DIAGNOSIS — E669 Obesity, unspecified: Secondary | ICD-10-CM | POA: Diagnosis present

## 2013-06-30 DIAGNOSIS — K920 Hematemesis: Secondary | ICD-10-CM

## 2013-06-30 DIAGNOSIS — N5089 Other specified disorders of the male genital organs: Secondary | ICD-10-CM | POA: Diagnosis present

## 2013-06-30 DIAGNOSIS — D649 Anemia, unspecified: Secondary | ICD-10-CM | POA: Diagnosis present

## 2013-06-30 DIAGNOSIS — K7460 Unspecified cirrhosis of liver: Secondary | ICD-10-CM | POA: Diagnosis present

## 2013-06-30 DIAGNOSIS — R531 Weakness: Secondary | ICD-10-CM

## 2013-06-30 DIAGNOSIS — G8929 Other chronic pain: Secondary | ICD-10-CM | POA: Diagnosis present

## 2013-06-30 DIAGNOSIS — K219 Gastro-esophageal reflux disease without esophagitis: Secondary | ICD-10-CM | POA: Diagnosis present

## 2013-06-30 DIAGNOSIS — M129 Arthropathy, unspecified: Secondary | ICD-10-CM | POA: Diagnosis present

## 2013-06-30 DIAGNOSIS — D684 Acquired coagulation factor deficiency: Secondary | ICD-10-CM | POA: Diagnosis present

## 2013-06-30 DIAGNOSIS — K7682 Hepatic encephalopathy: Secondary | ICD-10-CM

## 2013-06-30 DIAGNOSIS — R4182 Altered mental status, unspecified: Secondary | ICD-10-CM

## 2013-06-30 DIAGNOSIS — B1920 Unspecified viral hepatitis C without hepatic coma: Secondary | ICD-10-CM | POA: Diagnosis present

## 2013-06-30 DIAGNOSIS — IMO0001 Reserved for inherently not codable concepts without codable children: Secondary | ICD-10-CM | POA: Diagnosis present

## 2013-06-30 DIAGNOSIS — M7989 Other specified soft tissue disorders: Secondary | ICD-10-CM | POA: Diagnosis present

## 2013-06-30 DIAGNOSIS — K3189 Other diseases of stomach and duodenum: Secondary | ICD-10-CM | POA: Diagnosis present

## 2013-06-30 DIAGNOSIS — Z6838 Body mass index (BMI) 38.0-38.9, adult: Secondary | ICD-10-CM

## 2013-06-30 DIAGNOSIS — E1149 Type 2 diabetes mellitus with other diabetic neurological complication: Secondary | ICD-10-CM | POA: Diagnosis present

## 2013-06-30 DIAGNOSIS — K922 Gastrointestinal hemorrhage, unspecified: Secondary | ICD-10-CM | POA: Diagnosis present

## 2013-06-30 DIAGNOSIS — G40909 Epilepsy, unspecified, not intractable, without status epilepticus: Secondary | ICD-10-CM | POA: Diagnosis present

## 2013-06-30 DIAGNOSIS — W19XXXA Unspecified fall, initial encounter: Secondary | ICD-10-CM

## 2013-06-30 DIAGNOSIS — E039 Hypothyroidism, unspecified: Secondary | ICD-10-CM | POA: Diagnosis present

## 2013-06-30 DIAGNOSIS — I739 Peripheral vascular disease, unspecified: Secondary | ICD-10-CM | POA: Diagnosis present

## 2013-06-30 DIAGNOSIS — R188 Other ascites: Secondary | ICD-10-CM

## 2013-06-30 DIAGNOSIS — N189 Chronic kidney disease, unspecified: Secondary | ICD-10-CM | POA: Diagnosis present

## 2013-06-30 DIAGNOSIS — E43 Unspecified severe protein-calorie malnutrition: Secondary | ICD-10-CM

## 2013-06-30 DIAGNOSIS — E1165 Type 2 diabetes mellitus with hyperglycemia: Secondary | ICD-10-CM | POA: Diagnosis present

## 2013-06-30 DIAGNOSIS — R079 Chest pain, unspecified: Secondary | ICD-10-CM

## 2013-06-30 DIAGNOSIS — R601 Generalized edema: Secondary | ICD-10-CM

## 2013-06-30 DIAGNOSIS — Z880 Allergy status to penicillin: Secondary | ICD-10-CM

## 2013-06-30 DIAGNOSIS — Z87891 Personal history of nicotine dependence: Secondary | ICD-10-CM

## 2013-06-30 DIAGNOSIS — I959 Hypotension, unspecified: Secondary | ICD-10-CM | POA: Diagnosis present

## 2013-06-30 DIAGNOSIS — G92 Toxic encephalopathy: Secondary | ICD-10-CM

## 2013-06-30 DIAGNOSIS — D638 Anemia in other chronic diseases classified elsewhere: Secondary | ICD-10-CM

## 2013-06-30 DIAGNOSIS — I8500 Esophageal varices without bleeding: Secondary | ICD-10-CM

## 2013-06-30 DIAGNOSIS — F319 Bipolar disorder, unspecified: Secondary | ICD-10-CM | POA: Diagnosis present

## 2013-06-30 DIAGNOSIS — I872 Venous insufficiency (chronic) (peripheral): Secondary | ICD-10-CM | POA: Diagnosis present

## 2013-06-30 DIAGNOSIS — F1010 Alcohol abuse, uncomplicated: Secondary | ICD-10-CM

## 2013-06-30 DIAGNOSIS — R635 Abnormal weight gain: Secondary | ICD-10-CM | POA: Diagnosis present

## 2013-06-30 DIAGNOSIS — Z888 Allergy status to other drugs, medicaments and biological substances status: Secondary | ICD-10-CM

## 2013-06-30 DIAGNOSIS — E440 Moderate protein-calorie malnutrition: Secondary | ICD-10-CM

## 2013-06-30 DIAGNOSIS — K766 Portal hypertension: Secondary | ICD-10-CM | POA: Diagnosis present

## 2013-06-30 LAB — I-STAT CHEM 8, ED
BUN: 6 mg/dL (ref 6–23)
Calcium, Ion: 1.13 mmol/L (ref 1.12–1.23)
Chloride: 95 mEq/L — ABNORMAL LOW (ref 96–112)
Creatinine, Ser: 0.8 mg/dL (ref 0.50–1.35)
Glucose, Bld: 153 mg/dL — ABNORMAL HIGH (ref 70–99)
HCT: 19 % — ABNORMAL LOW (ref 39.0–52.0)
Hemoglobin: 6.5 g/dL — CL (ref 13.0–17.0)
Potassium: 3.9 mEq/L (ref 3.7–5.3)
SODIUM: 139 meq/L (ref 137–147)
TCO2: 28 mmol/L (ref 0–100)

## 2013-06-30 LAB — PRO B NATRIURETIC PEPTIDE: PRO B NATRI PEPTIDE: 669.2 pg/mL — AB (ref 0–125)

## 2013-06-30 MED ORDER — FUROSEMIDE 10 MG/ML IJ SOLN
80.0000 mg | Freq: Once | INTRAMUSCULAR | Status: AC
  Administered 2013-06-30: 80 mg via INTRAVENOUS
  Filled 2013-06-30: qty 8

## 2013-06-30 NOTE — ED Notes (Signed)
Pt. On cardiac monitor. 

## 2013-06-30 NOTE — ED Notes (Signed)
Per EMS: Pt has a swollen scrotum. Pt takes lasix for swelling and pt reports that it has gotten better. Pt wants to be cath b/c he thinks he is urinating too much. Pt alert. EMS states that he has been drinking EtOH and possible listerine.

## 2013-06-30 NOTE — ED Notes (Signed)
EKG given to EDP,Zammitt,MD. for review.
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
## 2013-06-30 NOTE — ED Notes (Signed)
Critical results given to Dr Roderic Palau.

## 2013-07-01 ENCOUNTER — Encounter (HOSPITAL_COMMUNITY): Payer: Self-pay | Admitting: Nurse Practitioner

## 2013-07-01 DIAGNOSIS — E1165 Type 2 diabetes mellitus with hyperglycemia: Secondary | ICD-10-CM

## 2013-07-01 DIAGNOSIS — IMO0002 Reserved for concepts with insufficient information to code with codable children: Secondary | ICD-10-CM

## 2013-07-01 DIAGNOSIS — D6959 Other secondary thrombocytopenia: Secondary | ICD-10-CM

## 2013-07-01 DIAGNOSIS — E039 Hypothyroidism, unspecified: Secondary | ICD-10-CM

## 2013-07-01 DIAGNOSIS — R609 Edema, unspecified: Secondary | ICD-10-CM

## 2013-07-01 DIAGNOSIS — K922 Gastrointestinal hemorrhage, unspecified: Secondary | ICD-10-CM

## 2013-07-01 DIAGNOSIS — K319 Disease of stomach and duodenum, unspecified: Secondary | ICD-10-CM

## 2013-07-01 DIAGNOSIS — D649 Anemia, unspecified: Secondary | ICD-10-CM | POA: Diagnosis present

## 2013-07-01 DIAGNOSIS — F1010 Alcohol abuse, uncomplicated: Secondary | ICD-10-CM

## 2013-07-01 DIAGNOSIS — K3189 Other diseases of stomach and duodenum: Secondary | ICD-10-CM | POA: Diagnosis present

## 2013-07-01 DIAGNOSIS — K7460 Unspecified cirrhosis of liver: Secondary | ICD-10-CM

## 2013-07-01 DIAGNOSIS — D638 Anemia in other chronic diseases classified elsewhere: Secondary | ICD-10-CM

## 2013-07-01 DIAGNOSIS — R4182 Altered mental status, unspecified: Secondary | ICD-10-CM

## 2013-07-01 DIAGNOSIS — B1920 Unspecified viral hepatitis C without hepatic coma: Secondary | ICD-10-CM

## 2013-07-01 DIAGNOSIS — I8500 Esophageal varices without bleeding: Secondary | ICD-10-CM

## 2013-07-01 DIAGNOSIS — E118 Type 2 diabetes mellitus with unspecified complications: Secondary | ICD-10-CM

## 2013-07-01 DIAGNOSIS — K766 Portal hypertension: Secondary | ICD-10-CM

## 2013-07-01 LAB — COMPREHENSIVE METABOLIC PANEL
ALBUMIN: 2 g/dL — AB (ref 3.5–5.2)
ALK PHOS: 209 U/L — AB (ref 39–117)
ALT: 22 U/L (ref 0–53)
AST: 61 U/L — ABNORMAL HIGH (ref 0–37)
BILIRUBIN TOTAL: 3.2 mg/dL — AB (ref 0.3–1.2)
BUN: 7 mg/dL (ref 6–23)
CHLORIDE: 97 meq/L (ref 96–112)
CO2: 24 mEq/L (ref 19–32)
Calcium: 7.8 mg/dL — ABNORMAL LOW (ref 8.4–10.5)
Creatinine, Ser: 0.59 mg/dL (ref 0.50–1.35)
GFR calc non Af Amer: >90 mL/min (ref >90)
GLUCOSE: 154 mg/dL — AB (ref 70–99)
POTASSIUM: 3.9 meq/L (ref 3.7–5.3)
Sodium: 135 mEq/L — ABNORMAL LOW (ref 137–147)
TOTAL PROTEIN: 6.2 g/dL (ref 6.0–8.3)

## 2013-07-01 LAB — CBC
HEMATOCRIT: 34 % — AB (ref 39.0–52.0)
HEMATOCRIT: 35.6 % — AB (ref 39.0–52.0)
HEMOGLOBIN: 11.7 g/dL — AB (ref 13.0–17.0)
Hemoglobin: 11.2 g/dL — ABNORMAL LOW (ref 13.0–17.0)
MCH: 35.3 pg — AB (ref 26.0–34.0)
MCH: 35.6 pg — AB (ref 26.0–34.0)
MCHC: 32.9 g/dL (ref 30.0–36.0)
MCHC: 32.9 g/dL (ref 30.0–36.0)
MCV: 107.3 fL — ABNORMAL HIGH (ref 78.0–100.0)
MCV: 108.2 fL — AB (ref 78.0–100.0)
PLATELETS: 43 10*3/uL — AB (ref 150–400)
Platelets: 34 10*3/uL — ABNORMAL LOW (ref 150–400)
RBC: 3.17 MIL/uL — ABNORMAL LOW (ref 4.22–5.81)
RBC: 3.29 MIL/uL — ABNORMAL LOW (ref 4.22–5.81)
RDW: 16.2 % — ABNORMAL HIGH (ref 11.5–15.5)
RDW: 16.6 % — ABNORMAL HIGH (ref 11.5–15.5)
WBC: 2.5 10*3/uL — ABNORMAL LOW (ref 4.0–10.5)
WBC: 2.5 10*3/uL — ABNORMAL LOW (ref 4.0–10.5)

## 2013-07-01 LAB — CBC WITH DIFFERENTIAL/PLATELET
BASOS ABS: 0 10*3/uL (ref 0.0–0.1)
BASOS PCT: 1 % (ref 0–1)
EOS PCT: 5 % (ref 0–5)
Eosinophils Absolute: 0.1 10*3/uL (ref 0.0–0.7)
HEMATOCRIT: 32.3 % — AB (ref 39.0–52.0)
Hemoglobin: 10.8 g/dL — ABNORMAL LOW (ref 13.0–17.0)
Lymphocytes Relative: 36 % (ref 12–46)
Lymphs Abs: 0.7 10*3/uL (ref 0.7–4.0)
MCH: 35.5 pg — ABNORMAL HIGH (ref 26.0–34.0)
MCHC: 33.4 g/dL (ref 30.0–36.0)
MCV: 106.3 fL — AB (ref 78.0–100.0)
MONO ABS: 0.4 10*3/uL (ref 0.1–1.0)
Monocytes Relative: 21 % — ABNORMAL HIGH (ref 3–12)
Neutro Abs: 0.8 10*3/uL — ABNORMAL LOW (ref 1.7–7.7)
Neutrophils Relative %: 37 % — ABNORMAL LOW (ref 43–77)
PLATELETS: 38 10*3/uL — AB (ref 150–400)
RBC: 3.04 MIL/uL — ABNORMAL LOW (ref 4.22–5.81)
RDW: 16.2 % — ABNORMAL HIGH (ref 11.5–15.5)
WBC: 2 10*3/uL — AB (ref 4.0–10.5)

## 2013-07-01 LAB — GLUCOSE, CAPILLARY
GLUCOSE-CAPILLARY: 121 mg/dL — AB (ref 70–99)
Glucose-Capillary: 124 mg/dL — ABNORMAL HIGH (ref 70–99)
Glucose-Capillary: 182 mg/dL — ABNORMAL HIGH (ref 70–99)
Glucose-Capillary: 176 mg/dL — ABNORMAL HIGH (ref 70–99)

## 2013-07-01 LAB — PROTIME-INR
INR: 1.96 — ABNORMAL HIGH (ref 0.00–1.49)
INR: 1.98 — AB (ref 0.00–1.49)
Prothrombin Time: 21.7 seconds — ABNORMAL HIGH (ref 11.6–15.2)
Prothrombin Time: 21.9 seconds — ABNORMAL HIGH (ref 11.6–15.2)

## 2013-07-01 LAB — PREPARE RBC (CROSSMATCH)

## 2013-07-01 MED ORDER — SODIUM CHLORIDE 0.9 % IJ SOLN
3.0000 mL | Freq: Two times a day (BID) | INTRAMUSCULAR | Status: DC
  Administered 2013-07-03: 18:00:00 via INTRAVENOUS
  Administered 2013-07-05: 3 mL via INTRAVENOUS

## 2013-07-01 MED ORDER — FUROSEMIDE 40 MG PO TABS
60.0000 mg | ORAL_TABLET | Freq: Three times a day (TID) | ORAL | Status: DC
  Administered 2013-07-01 – 2013-07-04 (×9): 60 mg via ORAL
  Filled 2013-07-01 (×13): qty 1

## 2013-07-01 MED ORDER — LACTULOSE 10 GM/15ML PO SOLN
40.0000 g | Freq: Every day | ORAL | Status: DC
  Administered 2013-07-01: 40 g via ORAL
  Filled 2013-07-01 (×14): qty 60

## 2013-07-01 MED ORDER — PANTOPRAZOLE SODIUM 40 MG IV SOLR
40.0000 mg | Freq: Two times a day (BID) | INTRAVENOUS | Status: DC
  Administered 2013-07-01: 40 mg via INTRAVENOUS
  Filled 2013-07-01 (×4): qty 40

## 2013-07-01 MED ORDER — FOLIC ACID 1 MG PO TABS
1.0000 mg | ORAL_TABLET | Freq: Every morning | ORAL | Status: DC
  Administered 2013-07-01 – 2013-07-06 (×6): 1 mg via ORAL
  Filled 2013-07-01 (×7): qty 1

## 2013-07-01 MED ORDER — ADULT MULTIVITAMIN W/MINERALS CH
1.0000 | ORAL_TABLET | Freq: Every morning | ORAL | Status: DC
  Administered 2013-07-01 – 2013-07-06 (×6): 1 via ORAL
  Filled 2013-07-01 (×6): qty 1

## 2013-07-01 MED ORDER — DEXTROSE 5 % IV SOLN: 1.0000 g | INTRAVENOUS | Status: DC

## 2013-07-01 MED ORDER — INSULIN GLARGINE 100 UNIT/ML ~~LOC~~ SOLN
12.0000 [IU] | Freq: Every day | SUBCUTANEOUS | Status: DC
  Administered 2013-07-01 – 2013-07-05 (×5): 12 [IU] via SUBCUTANEOUS
  Filled 2013-07-01 (×6): qty 0.12

## 2013-07-01 MED ORDER — ALBUTEROL SULFATE (2.5 MG/3ML) 0.083% IN NEBU: 2.5000 mg | INHALATION_SOLUTION | Freq: Four times a day (QID) | RESPIRATORY_TRACT | Status: DC | PRN

## 2013-07-01 MED ORDER — INSULIN ASPART 100 UNIT/ML ~~LOC~~ SOLN
0.0000 [IU] | Freq: Three times a day (TID) | SUBCUTANEOUS | Status: DC
  Administered 2013-07-01: 2 [IU] via SUBCUTANEOUS
  Administered 2013-07-01 (×2): 1 [IU] via SUBCUTANEOUS
  Administered 2013-07-02 (×2): 2 [IU] via SUBCUTANEOUS
  Administered 2013-07-03 – 2013-07-04 (×4): 1 [IU] via SUBCUTANEOUS
  Administered 2013-07-04 – 2013-07-06 (×4): 2 [IU] via SUBCUTANEOUS

## 2013-07-01 MED ORDER — ALPRAZOLAM 1 MG PO TABS
1.0000 mg | ORAL_TABLET | Freq: Every evening | ORAL | Status: DC | PRN
  Administered 2013-07-01 – 2013-07-05 (×5): 1 mg via ORAL
  Filled 2013-07-01 (×5): qty 1

## 2013-07-01 MED ORDER — LEVOTHYROXINE SODIUM 75 MCG PO TABS
75.0000 ug | ORAL_TABLET | Freq: Every day | ORAL | Status: DC
  Administered 2013-07-01 – 2013-07-06 (×6): 75 ug via ORAL
  Filled 2013-07-01 (×7): qty 1

## 2013-07-01 MED ORDER — PAROXETINE HCL 20 MG PO TABS
20.0000 mg | ORAL_TABLET | Freq: Every day | ORAL | Status: DC
  Administered 2013-07-01 – 2013-07-06 (×6): 20 mg via ORAL
  Filled 2013-07-01 (×6): qty 1

## 2013-07-01 MED ORDER — FUROSEMIDE 40 MG PO TABS: 60.0000 mg | ORAL_TABLET | Freq: Three times a day (TID) | ORAL | Status: DC

## 2013-07-01 MED ORDER — RIFAXIMIN 550 MG PO TABS
550.0000 mg | ORAL_TABLET | Freq: Two times a day (BID) | ORAL | Status: DC
  Administered 2013-07-01 – 2013-07-06 (×11): 550 mg via ORAL
  Filled 2013-07-01 (×12): qty 1

## 2013-07-01 MED ORDER — RISPERIDONE 0.25 MG PO TABS
0.2500 mg | ORAL_TABLET | Freq: Two times a day (BID) | ORAL | Status: DC
  Administered 2013-07-01 – 2013-07-06 (×12): 0.25 mg via ORAL
  Filled 2013-07-01 (×14): qty 1

## 2013-07-01 MED ORDER — VITAMIN K1 10 MG/ML IJ SOLN
10.0000 mg | Freq: Every day | INTRAMUSCULAR | Status: AC
  Administered 2013-07-01 – 2013-07-03 (×3): 10 mg via SUBCUTANEOUS
  Filled 2013-07-01 (×3): qty 1

## 2013-07-01 MED ORDER — ONDANSETRON HCL 4 MG PO TABS: 4.0000 mg | ORAL_TABLET | Freq: Three times a day (TID) | ORAL | Status: DC | PRN

## 2013-07-01 MED ORDER — LEVETIRACETAM 500 MG PO TABS
1000.0000 mg | ORAL_TABLET | Freq: Two times a day (BID) | ORAL | Status: DC
  Administered 2013-07-01 – 2013-07-06 (×11): 1000 mg via ORAL
  Filled 2013-07-01 (×13): qty 2

## 2013-07-01 MED ORDER — LACTULOSE 10 GM/15ML PO SOLN
40.0000 g | Freq: Three times a day (TID) | ORAL | Status: DC
  Administered 2013-07-01 – 2013-07-06 (×15): 40 g via ORAL
  Filled 2013-07-01 (×17): qty 60

## 2013-07-01 MED ORDER — SODIUM CHLORIDE 0.9 % IV SOLN
25.0000 ug/h | INTRAVENOUS | Status: DC
  Administered 2013-07-01: 25 ug/h via INTRAVENOUS
  Filled 2013-07-01 (×4): qty 1

## 2013-07-01 MED ORDER — CIPROFLOXACIN IN D5W 400 MG/200ML IV SOLN
400.0000 mg | Freq: Two times a day (BID) | INTRAVENOUS | Status: DC
  Administered 2013-07-01: 400 mg via INTRAVENOUS
  Filled 2013-07-01 (×2): qty 200

## 2013-07-01 MED ORDER — OXYCODONE HCL 5 MG PO TABS
20.0000 mg | ORAL_TABLET | Freq: Every day | ORAL | Status: DC
  Administered 2013-07-01 – 2013-07-06 (×29): 20 mg via ORAL
  Filled 2013-07-01 (×30): qty 4

## 2013-07-01 MED ORDER — ALBUTEROL SULFATE HFA 108 (90 BASE) MCG/ACT IN AERS: 1.0000 | INHALATION_SPRAY | Freq: Four times a day (QID) | RESPIRATORY_TRACT | Status: DC | PRN

## 2013-07-01 MED ORDER — NADOLOL 40 MG PO TABS
40.0000 mg | ORAL_TABLET | Freq: Every morning | ORAL | Status: DC
  Administered 2013-07-01 – 2013-07-06 (×7): 40 mg via ORAL
  Filled 2013-07-01 (×6): qty 1

## 2013-07-01 MED ORDER — FERROUS SULFATE 325 (65 FE) MG PO TABS
325.0000 mg | ORAL_TABLET | Freq: Every day | ORAL | Status: DC
  Administered 2013-07-01 – 2013-07-05 (×5): 325 mg via ORAL
  Filled 2013-07-01 (×6): qty 1

## 2013-07-01 MED ORDER — SODIUM CHLORIDE 0.9 % IV SOLN
25.0000 ug/h | INTRAVENOUS | Status: DC
  Administered 2013-07-01: 25 ug/h via INTRAVENOUS
  Filled 2013-07-01 (×2): qty 1

## 2013-07-01 MED ORDER — SODIUM CHLORIDE 0.9 % IV SOLN
80.0000 mg | Freq: Once | INTRAVENOUS | Status: DC
  Filled 2013-07-01: qty 80

## 2013-07-01 MED ORDER — CIPROFLOXACIN HCL 250 MG PO TABS
250.0000 mg | ORAL_TABLET | Freq: Two times a day (BID) | ORAL | Status: DC
  Administered 2013-07-01 – 2013-07-06 (×10): 250 mg via ORAL
  Filled 2013-07-01 (×12): qty 1

## 2013-07-01 MED ORDER — CIPROFLOXACIN IN D5W 400 MG/200ML IV SOLN: 400.0000 mg | Freq: Two times a day (BID) | INTRAVENOUS | Status: DC

## 2013-07-01 MED ORDER — SPIRONOLACTONE 100 MG PO TABS
200.0000 mg | ORAL_TABLET | Freq: Every morning | ORAL | Status: DC
  Administered 2013-07-01 – 2013-07-06 (×6): 200 mg via ORAL
  Filled 2013-07-01 (×6): qty 2

## 2013-07-01 MED ORDER — INSULIN ASPART 100 UNIT/ML ~~LOC~~ SOLN: 0.0000 [IU] | Freq: Every day | SUBCUTANEOUS | Status: DC

## 2013-07-01 NOTE — Progress Notes (Signed)
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  -continue lactulose and rifaximin  Anemia/heme positive stools -suspect hb of 6 was not real since repeat after 1 unit PRBC was 10 -on octreotide and PPI -GI following  DM -continue lantus, SSI  Thrombocytopenia/coagulopathy -due to advanced liver disease  H/o seizure d/o -continue keppra  Scrotal swelling -due to third spacing -lasix, scrotal support, low sodium diet  Code Status: Full Code Family Communication: none at bedside Disposition Plan: home vs SNF   Consultants:  GI  HPI/Subjective: Feels ok, but complains of scrotal swelling  Objective: Filed Vitals:   07/01/13 1402  BP: 115/50  Pulse: 63  Temp: 97.8 F (36.6 C)  Resp: 18    Intake/Output Summary (Last 24 hours) at 07/01/13 1417 Last data filed at 07/01/13 1402  Gross per 24 hour  Intake 1460.13 ml  Output   4225 ml  Net -2764.87 ml   Filed Weights   06/30/13 2205 07/01/13 0259  Weight: 113.399 kg (250 lb) 127 kg (279 lb 15.8 oz)    Exam:   General:  AAOxto self, place, no distress  Cardiovascular: S1S2/RRR  Respiratory: S1S2/RRR  Abdomen: soft, obese, slightly distended, BS present  GU: scrotal swelling  Musculoskeletal: 1 plus edema  Data Reviewed: Basic Metabolic Panel:  Recent Labs Lab 06/25/13 0440 06/26/13 0400 06/30/13 2231 06/30/13 2310  NA 137 138 139 135*  K 3.9 3.7 3.9 3.9  CL 97 96 95* 97  CO2 32 37*  --  24  GLUCOSE 161* 212* 153* 154*  BUN 7 8 6 7   CREATININE 0.51 0.61 0.80 0.59  CALCIUM 8.3* 8.2*  --  7.8*   Liver Function Tests:  Recent Labs Lab 06/25/13 0440 06/26/13 0400 06/30/13 2310  AST 73* 63* 61*  ALT 25 23 22   ALKPHOS 260* 252*  209*  BILITOT 2.5* 2.5* 3.2*  PROT 5.8* 6.0 6.2  ALBUMIN 1.9* 2.0* 2.0*   No results found for this basename: LIPASE, AMYLASE,  in the last 168 hours  Recent Labs Lab 06/25/13 0440 06/26/13 0400  AMMONIA 98* 62*   CBC:  Recent Labs Lab 06/25/13 0605 06/26/13 0400 06/30/13 2231 07/01/13 0754  WBC 2.6* 3.0*  --  2.0*  NEUTROABS  --   --   --  0.8*  HGB 10.8* 10.9* 6.5* 10.8*  HCT 32.7* 32.1* 19.0* 32.3*  MCV 109.4* 108.1*  --  106.3*  PLT 41* 36*  --  38*   Cardiac Enzymes: No results found for this basename: CKTOTAL, CKMB, CKMBINDEX, TROPONINI,  in the last 168 hours BNP (last 3 results)  Recent Labs  05/31/13 2330 06/22/13 1738 06/30/13 2225  PROBNP 304.0* 296.3* 669.2*   CBG:  Recent Labs Lab 06/25/13 2212 06/26/13 0735 06/26/13 1156 07/01/13 0739 07/01/13 1132  GLUCAP 196* 167* 191* 121* 124*    No results found for this or any previous visit (from the past 240 hour(s)).   Studies: Dg Chest Portable 1 View  06/30/2013   CLINICAL DATA:  Swelling of the legs.  Shortness of breath.  EXAM: PORTABLE CHEST - 1 VIEW  COMPARISON:  06/22/2013  FINDINGS: Chronic cardiopericardial enlargement. Unremarkable upper mediastinal contours for technique. Enlarged central pulmonary vasculature with cephalized blood flow. An apparent rounded nodular area overlapping the posterior right  fourth rib is likely projectional given this region was clear on departmental exam 06/22/2013. No evidence of effusion or pneumothorax.  IMPRESSION: Mild CHF.   Electronically Signed   By: Jorje Guild M.D.   On: 06/30/2013 22:56    Scheduled Meds: . ciprofloxacin  250 mg Oral BID  . ferrous sulfate  325 mg Oral QHS  . folic acid  1 mg Oral q morning - 10a  . furosemide  60 mg Oral TID WC  . insulin aspart  0-5 Units Subcutaneous QHS  . insulin aspart  0-9 Units Subcutaneous TID WC  . insulin glargine  12 Units Subcutaneous QHS  . lactulose  40 g Oral TID  . levETIRAcetam  1,000 mg  Oral BID  . levothyroxine  75 mcg Oral QAC breakfast  . multivitamin with minerals  1 tablet Oral q morning - 10a  . nadolol  40 mg Oral q morning - 10a  . oxyCODONE  20 mg Oral 5 X Daily  . pantoprazole (PROTONIX) IV  40 mg Intravenous Q12H  . PARoxetine  20 mg Oral Daily  . phytonadione  10 mg Subcutaneous Daily  . rifaximin  550 mg Oral BID  . risperiDONE  0.25 mg Oral BID  . sodium chloride  3 mL Intravenous Q12H  . spironolactone  200 mg Oral q morning - 10a   Continuous Infusions: . octreotide (SANDOSTATIN) infusion 25 mcg/hr (07/01/13 1351)   Antibiotics Given (last 72 hours)   Date/Time Action Medication Dose Rate   07/01/13 1010 Given   rifaximin (XIFAXAN) tablet 550 mg 550 mg    07/01/13 1044 Given   ciprofloxacin (CIPRO) IVPB 400 mg 400 mg 200 mL/hr      Principal Problem:   Symptomatic anemia Active Problems:   Cirrhosis   Hypothyroidism   Diabetes mellitus type 2, uncontrolled, with complications   Anasarca   Scrotal swelling   GIB (gastrointestinal bleeding)   Portal hypertensive gastropathy    Time spent: 27min    Chattie Greeson  [facility] Pager [phone]. If 7PM-7AM, please contact night-coverage at [email or web address], password [facility] 07/01/2013, 2:17 PM  LOS: 1 day

## 2013-07-01 NOTE — Progress Notes (Signed)
Pt received 1 unit of blood. This RN then began infusion of octreotide in pt's only good peripheral IV. IV team was paged at this time to attempt to start another IV to infuse the 2nd unit of blood. IV RN recognized pt as being a hard stick and verbalized that she would try to start an IV after she was finished drawing her labs for the day. Dr. Posey Pronto, MD is aware that labs cannot be drawn until 2 hours after blood has finished transfusing and that pt is not able to received the 2nd unit of blood at this time due to being a hard stick. MD verbalized that the possibility of a PICC line insertion would be decided on day shift. Will continue to monitor pt closely. Helayne Seminole
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
## 2013-07-01 NOTE — ED Notes (Signed)
Writer has tried twice with lab draws, and was unsuccessful.  RN notified

## 2013-07-01 NOTE — Progress Notes (Signed)
Pt admitted from ED. VSS. Blood transfusing in right upper arm IV. No distress noted. Pt alert and oriented and oriented to room and floor. Will continue to monitor pt closely. Helayne [place]

## 2013-07-01 NOTE — Care Management Note (Addendum)
    Page 1 of 1   07/06/2013     12:36:39 PM CARE MANAGEMENT NOTE 07/06/2013  Patient:  Tony Boyd, Tony Boyd   Account Number:  [id number]  Date Initiated:  07/01/2013  Documentation initiated by:  Dessa Phi  Subjective/Objective Assessment:   58 Y/O M ADMITTED W/SCROTAL SWELLING.READMIT-ANASARCA 4/27-5/1.WG:NFAOZHYQM,VHQION HTN GASTROPATHY.     Action/Plan:   FROM HOME.SET UP W/[facility]-RN/PT-NO VISIT MADE.   Anticipated DC Date:  07/06/2013   Anticipated DC Plan:  [place]  CM consult      [facility] Choice  Resumption Of Svcs/PTA Provider   Choice offered to / List presented to:  [name]        [place] arranged  HH-1 RN  [place].   Status of service:  Completed, signed off Medicare Important Message given?   (If response is "NO", the following Medicare IM given date fields will be blank) Date Medicare IM given:  07/06/2013 Date Additional Medicare IM given:    Discharge Disposition:  [place]  Per UR Regulation:  Reviewed for med. necessity/level of care/duration of stay  If discussed at [place] of Stay Meetings, dates discussed:    Comments:  07/06/13 Tony Lame RN,BSN NCM [phone] D/C [place] [facility].  07/01/13 Tony Cid RN,BSN NCM [place].RECOMMEND HHRN/PT.AWAIT FINAL HHC ORDERS.

## 2013-07-01 NOTE — Consult Note (Addendum)
Patient seen, examined, and I agree with the above documentation, including the assessment and plan. 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  Now with vol overload. This morning he had 1st black stool which was loose.  He notes all stools loose with lactulose about 6-8 stools daily (non-melenic or bloody at home) but taking lactulose 6 times daily.  BMs at home limit ability to come and go due to freq stooling. I do not think at time of presentation he had had a large GI bleed, despite initial hgb measurement.  Left hospital 5/1 with Hgb 10.8, around 6 yesterday and returned to 10.8 today with 1 unit.  This argues against significant bleeding That said, with black stools, coagulopathy and low plts, needs to be watched very closely for bleeding at this time [place] Vit K 10 mg x 3 days Serial Hgb Would need plts if definitely bleeding, target plt at least >50K He is still drinking etoh in the form of a tomato juice, which he states he did not know it contained etoh.  He plans to switch to V8.  ETOH is paramount as I would expect some reduction in portal pressures with abstinence. Continue octreotide gtt for 24 more hours until we know about active bleeding. BID IV PPI I am hesitant to proceed with EGD now, in the absence of over bleeding, in the setting of INR 2 and plts 35K Needs 2 large bore PIV at ALL times Consider scrotal wrap for comfort Continue diuresis with close attn to renal function Close observation

## 2013-07-02 ENCOUNTER — Inpatient Hospital Stay (HOSPITAL_COMMUNITY): Payer: Medicare Other

## 2013-07-02 DIAGNOSIS — R188 Other ascites: Secondary | ICD-10-CM

## 2013-07-02 LAB — GLUCOSE, CAPILLARY
GLUCOSE-CAPILLARY: 117 mg/dL — AB (ref 70–99)
GLUCOSE-CAPILLARY: 169 mg/dL — AB (ref 70–99)
GLUCOSE-CAPILLARY: 157 mg/dL — AB (ref 70–99)
GLUCOSE-CAPILLARY: 99 mg/dL (ref 70–99)

## 2013-07-02 MED ORDER — LIDOCAINE HCL 1 % IJ SOLN
INTRAMUSCULAR | Status: AC
Start: 2013-07-02 — End: 2013-07-02
  Filled 2013-07-02: qty 20

## 2013-07-02 MED ORDER — PANTOPRAZOLE SODIUM 40 MG IV SOLR
40.0000 mg | INTRAVENOUS | Status: DC
  Administered 2013-07-02 – 2013-07-06 (×5): 40 mg via INTRAVENOUS
  Filled 2013-07-02 (×5): qty 40

## 2013-07-02 MED ORDER — LIDOCAINE HCL 1 % IJ SOLN
INTRAMUSCULAR | Status: AC
  Filled 2013-07-02: qty 20

## 2013-07-02 NOTE — Progress Notes (Signed)
Patient seen, examined, and I agree with the above documentation, including the assessment and plan. No evidence for GI bleeding with stable Hgb.  I expect lab error with initial Hgb on presentation Stopping octreotide.  BID PPI Decompensated cirrhosis with poor overall life expectancy.  Unfortunately he has continued to drink ETOH at home recently which would prevent transplant eval at this time. Agree with advancing diet Continue diuresis, low Na diet Getting Vit K without improvement in INR indicative of poor synthetic function Possible EGD before d/c, though he is on beta blockers for variceal prophylaxis and for portal HTN gastropathy

## 2013-07-02 NOTE — Procedures (Signed)
R arm PowerPICC placed under US and fluoroscopy No ptx on spot chest radiograph. No complication No blood loss. See complete dictation in [facility] PACS.  

## 2013-07-02 NOTE — Progress Notes (Signed)
This am, RN found IV was infiltrated. IV team was paged to restart an IV and was unsuccessful x2 after using ultrasound. On call NP was notified and gave order for "urgent" PICC line placement this am. Will continue to monitor pt closely. Tony Boyd

## 2013-07-02 NOTE — Progress Notes (Signed)
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  -continue lactulose and rifaximin  Anemia/heme positive stools -suspect hb of 6 was lab error since repeat after 1 unit PRBC was 10 -on octreotide, ? Stop this, still reports dark stools but hb stable -continue PPI -GI following, plan for EGD this admission -given Vitamin K for coagulopathy due to liver disease  DM -stable -continue lantus, SSI  Thrombocytopenia/coagulopathy -due to advanced liver disease -Vitamin K given 5/6 and 5/7  H/o seizure d/o -continue keppra  Scrotal swelling -due to third spacing -lasix, scrotal support, low sodium diet  Ambulate, PT  Code Status: Full Code Family Communication: none at bedside Disposition Plan: home vs SNF   Consultants:  GI  HPI/Subjective: Feels ok, still complains scrotal swelling but reports that it may be slightly less  Objective: Filed Vitals:   07/02/13 0555  BP: 122/64  Pulse: 58  Temp: 97.8 F (36.6 C)  Resp: 16    Intake/Output Summary (Last 24 hours) at 07/02/13 1309 Last data filed at 07/02/13 0809  Gross per 24 hour  Intake   2080 ml  Output   6377 ml  Net  -4297 ml   Filed Weights   06/30/13 2205 07/01/13 0259 07/02/13 0417  Weight: 113.399 kg (250 lb) 127 kg (279 lb 15.8 oz) 122.108 kg (269 lb 3.2 oz)    Exam:   General:  AA[place]xto self, place, no distress  Cardiovascular: S1S2/RRR  Respiratory: S1S2/RRR  Abdomen: soft, obese, slightly distended, BS present  GU: scrotal swelling mildly better  Musculoskeletal: 1 plus edema  Data Reviewed: Basic Metabolic Panel:  Recent Labs Lab 06/26/13 0400 06/30/13 2231 06/30/13 2310  NA 138 139 135*  K 3.7 3.9 3.9  CL 96 95* 97   CO2 37*  --  24  GLUCOSE 212* 153* 154*  BUN 8 6 7   CREATININE 0.61 0.80 0.59  CALCIUM 8.2*  --  7.8*   Liver Function Tests:  Recent Labs Lab 06/26/13 0400 06/30/13 2310  AST 63* 61*  ALT 23 22  ALKPHOS 252* 209*  BILITOT 2.5* 3.2*  PROT 6.0 6.2  ALBUMIN 2.0* 2.0*   No results found for this basename: LIPASE, AMYLASE,  in the last 168 hours  Recent Labs Lab 06/26/13 0400  AMMONIA 62*   CBC:  Recent Labs Lab 06/26/13 0400 06/30/13 2231 07/01/13 0754 07/01/13 1612 07/01/13 2335  WBC 3.0*  --  2.0* 2.5* 2.5*  NEUTROABS  --   --  0.8*  --   --   HGB 10.9* 6.5* 10.8* 11.7* 11.2*  HCT 32.1* 19.0* 32.3* 35.6* 34.0*  MCV 108.1*  --  106.3* 108.2* 107.3*  PLT 36*  --  38* 34* 43*   Cardiac Enzymes: No results found for this basename: CKTOTAL, CKMB, CKMBINDEX, TROPONINI,  in the last 168 hours BNP (last 3 results)  Recent Labs  05/31/13 2330 06/22/13 1738 06/30/13 2225  PROBNP 304.0* 296.3* 669.2*   CBG:  Recent Labs Lab 07/01/13 1132 07/01/13 1629 07/01/13 2216 07/02/13 0729 07/02/13 1156  GLUCAP 124* 182* 176* 117* 169*    No results found for this or any previous visit (from the past 240 hour(s)).   Studies: Ir Fluoro Guide Cv Line Right  07/02/2013   CLINICAL DATA:  Cirrhosis, needs IV  access for transfusion.  EXAM: PICC PLACEMENT WITH ULTRASOUND AND FLUOROSCOPY  FLUOROSCOPY TIME:  6 seconds  TECHNIQUE: After written informed consent was obtained, patient was placed in the supine position on angiographic table. Patency of the right tracheal vein was confirmed with ultrasound with image documentation. An appropriate skin site was determined. Skin site was marked. Region was prepped using maximum barrier technique including cap and mask, sterile gown, sterile gloves, large sterile sheet, and Chlorhexidine as cutaneous antisepsis. The region was infiltrated locally with 1% lidocaine. Under real-time ultrasound guidance, the right tracheal vein was  accessed with a 21 gauge micropuncture needle; the needle tip within the vein was confirmed with ultrasound image documentation. Needle exchanged over a 018 guidewire for a peel-away sheath, through which a 5-French double-lumen PICC trimmed to 36cm was advanced, positioned with its tip near the cavoatrial junction. Spot chest radiograph confirms appropriate catheter position. Catheter was flushed per protocol and secured externally with 0-Prolene sutures. The patient tolerated procedure well, with no immediate complication.  IMPRESSION: Technically successful five [place] double lumen  PICC placement   Electronically Signed   By: Arne Cleveland M.D.   On: 07/02/2013 12:56   Ir US Guide Vasc Access Right  07/02/2013   CLINICAL DATA:  Cirrhosis, needs IV access for transfusion.  EXAM: PICC PLACEMENT WITH ULTRASOUND AND FLUOROSCOPY  FLUOROSCOPY TIME:  6 seconds  TECHNIQUE: After written informed consent was obtained, patient was placed in the supine position on angiographic table. Patency of the right tracheal vein was confirmed with ultrasound with image documentation. An appropriate skin site was determined. Skin site was marked. Region was prepped using maximum barrier technique including cap and mask, sterile gown, sterile gloves, large sterile sheet, and Chlorhexidine as cutaneous antisepsis. The region was infiltrated locally with 1% lidocaine. Under real-time ultrasound guidance, the right tracheal vein was accessed with a 21 gauge micropuncture needle; the needle tip within the vein was confirmed with ultrasound image documentation. Needle exchanged over a 018 guidewire for a peel-away sheath, through which a 5-French double-lumen PICC trimmed to 36cm was advanced, positioned with its tip near the cavoatrial junction. Spot chest radiograph confirms appropriate catheter position. Catheter was flushed per protocol and secured externally with 0-Prolene sutures. The patient tolerated procedure well, with no  immediate complication.  IMPRESSION: Technically successful five [place] double lumen  PICC placement   Electronically Signed   By: Arne Cleveland M.D.   On: 07/02/2013 12:56   Dg Chest Portable 1 View  06/30/2013   CLINICAL DATA:  Swelling of the legs.  Shortness of breath.  EXAM: PORTABLE CHEST - 1 VIEW  COMPARISON:  06/22/2013  FINDINGS: Chronic cardiopericardial enlargement. Unremarkable upper mediastinal contours for technique. Enlarged central pulmonary vasculature with cephalized blood flow. An apparent rounded nodular area overlapping the posterior right fourth rib is likely projectional given this region was clear on departmental exam 06/22/2013. No evidence of effusion or pneumothorax.  IMPRESSION: Mild CHF.   Electronically Signed   By: Jorje Guild M.D.   On: 06/30/2013 22:56    Scheduled Meds: . ciprofloxacin  250 mg Oral BID  . ferrous sulfate  325 mg Oral QHS  . folic acid  1 mg Oral q morning - 10a  . furosemide  60 mg Oral TID WC  . insulin aspart  0-5 Units Subcutaneous QHS  . insulin aspart  0-9 Units Subcutaneous TID WC  . insulin glargine  12 Units Subcutaneous QHS  . lactulose  40 g Oral TID  .  levETIRAcetam  1,000 mg Oral BID  . levothyroxine  75 mcg Oral QAC breakfast  . lidocaine      . multivitamin with minerals  1 tablet Oral q morning - 10a  . nadolol  40 mg Oral q morning - 10a  . oxyCODONE  20 mg Oral 5 X Daily  . pantoprazole (PROTONIX) IV  40 mg Intravenous Q24H  . PARoxetine  20 mg Oral Daily  . phytonadione  10 mg Subcutaneous Daily  . rifaximin  550 mg Oral BID  . risperiDONE  0.25 mg Oral BID  . sodium chloride  3 mL Intravenous Q12H  . spironolactone  200 mg Oral q morning - 10a   Continuous Infusions:   Antibiotics Given (last 72 hours)   Date/Time Action Medication Dose Rate   07/01/13 1010 Given   rifaximin (XIFAXAN) tablet 550 mg 550 mg    07/01/13 1044 Given   ciprofloxacin (CIPRO) IVPB 400 mg 400 mg 200 mL/hr   07/01/13 2036 Given    ciprofloxacin (CIPRO) tablet 250 mg 250 mg    07/01/13 2141 Given   rifaximin (XIFAXAN) tablet 550 mg 550 mg    07/02/13 2440 Given   ciprofloxacin (CIPRO) tablet 250 mg 250 mg    07/02/13 1147 Given   rifaximin (XIFAXAN) tablet 550 mg 550 mg       Principal Problem:   Symptomatic anemia Active Problems:   Cirrhosis   Hypothyroidism   Diabetes mellitus type 2, uncontrolled, with complications   Anasarca   Scrotal swelling   GIB (gastrointestinal bleeding)   Portal hypertensive gastropathy    Time spent: 26min    [facility] Pager [phone]. If 7PM-7AM, please contact night-coverage at [email or web address], password [facility] 07/02/2013, 1:09 PM  LOS: 2 days

## 2013-07-02 NOTE — Progress Notes (Signed)
Received patient  post PICC placement RUA, dressing intact with blood stain, marked upon patients arrival to the room.   Port   swabbed, connected to NS @ KVO.

## 2013-07-02 NOTE — Progress Notes (Signed)
[facility] Progress Note  Subjective:  Feels good.  No BM's overnight.  Had two BM's this AM and one was dark-colored.  Getting washed up currently.  Objective:  Vital signs in last 24 hours: Temp:  [97.8 F (36.6 C)-98.3 F (36.8 C)] 97.8 F (36.6 C) (05/07 0555) Pulse Rate:  [58-63] 58 (05/07 0555) Resp:  [16-18] 16 (05/07 0555) BP: (115-122)/(50-64) 122/64 mmHg (05/07 0555) SpO2:  [93 %-97 %] 97 % (05/07 0555) Weight:  [269 lb 3.2 oz (122.108 kg)] 269 lb 3.2 oz (122.108 kg) (05/07 0417) Last BM Date: 07/02/13 General:  Alert, chronically ill-appearing, in NAD Heart:  Slightly bradycardic but regular rhythm; no murmurs Pulm:  CTAB.  No W/R/R. Abdomen:  Soft, obese, slightly distended. Normal bowel sounds.  Non-tender.  Multiple scars noted on abdomen. Extremities:  Significant edema noted with skin color cahnges secondary to venous stasis. Neurologic:  Alert and oriented x4;  grossly normal neurologically. Psych:  Alert and cooperative. Normal mood and affect.  Intake/Output from previous day: 05/06 0701 - 05/07 0700 In: 2040 [P.O.:1740; I.V.:100; IV Piggyback:200] Out: 0865 [Urine:6375; Stool:2]  Lab Results:  Recent Labs  07/01/13 0754 07/01/13 1612 07/01/13 2335  WBC 2.0* 2.5* 2.5*  HGB 10.8* 11.7* 11.2*  HCT 32.3* 35.6* 34.0*  PLT 38* 34* 43*   BMET  Recent Labs  06/30/13 2231 06/30/13 2310  NA 139 135*  K 3.9 3.9  CL 95* 97  CO2  --  24  GLUCOSE 153* 154*  BUN 6 7  CREATININE 0.80 0.59  CALCIUM  --  7.8*   LFT  Recent Labs  06/30/13 2310  PROT 6.2  ALBUMIN 2.0*  AST 61*  ALT 22  ALKPHOS 209*  BILITOT 3.2*   PT/INR  Recent Labs  07/01/13 0754 07/01/13 2335  LABPROT 21.7* 21.9*  INR 1.96* 1.98*   Dg Chest Portable 1 View  06/30/2013   CLINICAL DATA:  Swelling of the legs.  Shortness of breath.  EXAM: PORTABLE CHEST - 1 VIEW  COMPARISON:  06/22/2013  FINDINGS: Chronic cardiopericardial enlargement. Unremarkable upper  mediastinal contours for technique. Enlarged central pulmonary vasculature with cephalized blood flow. An apparent rounded nodular area overlapping the posterior right fourth rib is likely projectional given this region was clear on departmental exam 06/22/2013. No evidence of effusion or pneumothorax.  IMPRESSION: Mild CHF.   Electronically Signed   By: Jorje Guild M.D.   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 MELD is 18.  -Acute drop in Hgb: Hgb was down from 10.9 grams to 6.5 grams in just 4 days. No evidence of overt GIB, but was heme positive in the ED. Hgb back up to 10.8 grams after just one unit of PRBC's and now is 11.2 grams still today. ? If the initial value was erroneous.  -Active ETOH abuse   *He may need an EGD prior to discharge, but we need to correct his INR and need to see if his platelet count improves first.  SQ Vitamin K ordered and dose given yesterday without improvement in his coags.  *Monitor Hgb, coags, platelet count, LFT's and lytes.  *Will discontinue octreotide gtt.  *Will advance diet to low sodium. *Needs ETOH abstinence.    LOS: 2 days   Tony Boyd. Tony Boyd  07/02/2013, 8:54 AM  Pager number [phone]

## 2013-07-03 DIAGNOSIS — F411 Generalized anxiety disorder: Secondary | ICD-10-CM

## 2013-07-03 LAB — BASIC METABOLIC PANEL
BUN: 7 mg/dL (ref 6–23)
CO2: 36 meq/L — AB (ref 19–32)
Calcium: 8 mg/dL — ABNORMAL LOW (ref 8.4–10.5)
Chloride: 97 mEq/L (ref 96–112)
Creatinine, Ser: 0.58 mg/dL (ref 0.50–1.35)
GFR calc Af Amer: >90 mL/min (ref >90)
GFR calc non Af Amer: >90 mL/min (ref >90)
GLUCOSE: 145 mg/dL — AB (ref 70–99)
POTASSIUM: 3.8 meq/L (ref 3.7–5.3)
SODIUM: 138 meq/L (ref 137–147)

## 2013-07-03 LAB — CBC
HCT: 33.6 % — ABNORMAL LOW (ref 39.0–52.0)
HEMOGLOBIN: 10.8 g/dL — AB (ref 13.0–17.0)
MCH: 34.8 pg — ABNORMAL HIGH (ref 26.0–34.0)
MCHC: 32.1 g/dL (ref 30.0–36.0)
MCV: 108.4 fL — ABNORMAL HIGH (ref 78.0–100.0)
Platelets: 43 10*3/uL — ABNORMAL LOW (ref 150–400)
RBC: 3.1 MIL/uL — AB (ref 4.22–5.81)
RDW: 16.2 % — ABNORMAL HIGH (ref 11.5–15.5)
WBC: 2.8 10*3/uL — AB (ref 4.0–10.5)

## 2013-07-03 LAB — PROTIME-INR
INR: 2 — ABNORMAL HIGH (ref 0.00–1.49)
Prothrombin Time: 22.1 seconds — ABNORMAL HIGH (ref 11.6–15.2)

## 2013-07-03 LAB — GLUCOSE, CAPILLARY
GLUCOSE-CAPILLARY: 123 mg/dL — AB (ref 70–99)
GLUCOSE-CAPILLARY: 132 mg/dL — AB (ref 70–99)
GLUCOSE-CAPILLARY: 155 mg/dL — AB (ref 70–99)
Glucose-Capillary: 145 mg/dL — ABNORMAL HIGH (ref 70–99)

## 2013-07-03 LAB — MAGNESIUM: MAGNESIUM: 1.4 mg/dL — AB (ref 1.5–2.5)

## 2013-07-03 MED ORDER — SODIUM CHLORIDE 0.9 % IJ SOLN
10.0000 mL | INTRAMUSCULAR | Status: DC | PRN
  Administered 2013-07-03 – 2013-07-05 (×2): 10 mL

## 2013-07-03 NOTE — Evaluation (Signed)
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  Pt evaluated by PT with no further acute PT needs identified.  Pt ambulated in hallway with rollator, steady throughout, and reports he is near baseline with ambulation.  Discussed that pt may ambulate in hallway with nursing staff for remainder of stay at [facility].  No follow-up PT or equipment recommendations.  PT is singing off.  Thank you for this referral.    Follow Up Recommendations No PT follow up    Equipment Recommendations  None recommended by PT    Recommendations for Other Services       Precautions / Restrictions Precautions Precautions: Fall Restrictions Weight Bearing Restrictions: No      Mobility  Bed Mobility Overal bed mobility: Needs Assistance Bed Mobility: Supine to Sit;Sit to Supine     Supine to sit: Supervision;HOB elevated Sit to supine: Supervision   General bed mobility comments: pt does well with bed mobility  Transfers Overall transfer level: Needs assistance Equipment used: None Transfers: Sit to/from Stand Sit to Stand: Supervision         General transfer comment: pt stands without AD, has rollator nearby to use for walking  Ambulation/Gait Ambulation/Gait assistance: Supervision Ambulation Distance (Feet): 500 Feet Assistive device: 4-wheeled walker Gait Pattern/deviations: Step-through pattern;Decreased stride length;Wide base of support     General Gait Details: when in bathroom, pt holds onto sink and walls when walking; pt is steady with ambulation with rollator, reported he is at baseline  ambulation  Stairs            Wheelchair Mobility    Modified Rankin (Stroke Patients Only)       Balance                                             Pertinent Vitals/Pain No complaints of pain.  Activity to tolerance.    Home Living Family/patient expects to be discharged to:: Private residence Living Arrangements: Alone Available Help at Discharge: Family;Available PRN/intermittently Type of Home: Apartment Home Access: Stairs to enter Entrance Stairs-Rails: None Entrance Stairs-Number of Steps: 2 at front, 1 at back Home Layout: One level Home Equipment: Walker - 4 wheels;Cane - single point      Prior Function Level of Independence: Independent with assistive device(s)         Comments: uses 4 wheeled walker for ambulation     Hand Dominance        Extremity/Trunk Assessment   Upper Extremity Assessment: Overall WFL for tasks assessed           Lower Extremity Assessment: Overall WFL for tasks assessed      Cervical / Trunk Assessment: Normal  Communication   Communication: No difficulties  Cognition Arousal/Alertness: Awake/alert Behavior During Therapy: WFL for tasks assessed/performed Overall Cognitive Status: Within Functional Limits for tasks assessed                      General Comments      Exercises        Assessment/Plan  PT Assessment Patent does not need any further PT services  PT Diagnosis     PT Problem List    PT Treatment Interventions     PT Goals (Current goals can be found in the Care Plan section) Acute Rehab PT Goals PT Goal Formulation: No goals set, d/c therapy    Frequency     Barriers to discharge        Co-evaluation               End of Session   Activity Tolerance: Patient tolerated treatment well Patient left: in bed;with bed alarm set           Time: 1020-1045 PT Time Calculation (min): 25 min   Charges:   PT Evaluation $Initial PT  Evaluation Tier I: 1 Procedure PT Treatments $Gait Training: 8-22 mins   PT G CodesJacqulyn Cane Jul 30, 2013, 12:11 PM Jacqulyn Cane SPT 07/30/2013

## 2013-07-03 NOTE — Evaluation (Signed)
I have reviewed this note and agree with all findings. Kati Terrin Imparato, PT, DPT Pager: [phone]   

## 2013-07-03 NOTE — Progress Notes (Signed)
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  -continue lactulose and rifaximin  Anemia/heme positive stools -suspect hb of 6 was lab error since repeat after 1 unit PRBC was 10 -off octreotide, reported dark stools early this admission, now resolved -Hb stable -continue PPI -GI following, plan for EGD this admission -given Vitamin K for coagulopathy due to liver disease  DM -stable -continue lantus, SSI  Thrombocytopenia/coagulopathy -without much change -due to advanced liver disease -Vitamin K given 5/6 and 5/7  H/o seizure d/o -continue keppra  Scrotal swelling -due to third spacing/hypoalbuminema -moderate improvement -lasix, scrotal support, low sodium diet  Ambulate, PT  Code Status: Full Code Family Communication: none at bedside Disposition Plan: home    Consultants:  GI  HPI/Subjective: Feels ok, still complains scrotal swelling but reports that it may be slightly less  Objective: Filed Vitals:   07/03/13 0531  BP: 119/54  Pulse: 64  Temp: 97.8 F (36.6 C)  Resp: 16    Intake/Output Summary (Last 24 hours) at 07/03/13 1104 Last data filed at 07/03/13 1000  Gross per 24 hour  Intake   1080 ml  Output   4401 ml  Net  -3321 ml   Filed Weights   07/01/13 0259 07/02/13 0417 07/03/13 0604  Weight: 127 kg (279 lb 15.8 oz) 122.108 kg (269 lb 3.2 oz) 120.475 kg (265 lb 9.6 oz)    Exam:   General:  AAOxto self, place, no distress  Cardiovascular: S1S2/RRR  Respiratory: S1S2/RRR  Abdomen: soft, obese, slightly distended, BS present  GU: scrotal swelling mildly better  Musculoskeletal: 1 plus edema  Data Reviewed: Basic Metabolic Panel:  Recent Labs Lab 06/30/13 2231  06/30/13 2310 07/03/13 0510  NA 139 135* 138  K 3.9 3.9 3.8  CL 95* 97 97  CO2  --  24 36*  GLUCOSE 153* 154* 145*  BUN 6 7 7   CREATININE 0.80 0.59 0.58  CALCIUM  --  7.8* 8.0*  MG  --   --  1.4*   Liver Function Tests:  Recent Labs Lab 06/30/13 2310  AST 61*  ALT 22  ALKPHOS 209*  BILITOT 3.2*  PROT 6.2  ALBUMIN 2.0*   No results found for this basename: LIPASE, AMYLASE,  in the last 168 hours No results found for this basename: AMMONIA,  in the last 168 hours CBC:  Recent Labs Lab 06/30/13 2231 07/01/13 0754 07/01/13 1612 07/01/13 2335 07/03/13 0510  WBC  --  2.0* 2.5* 2.5* 2.8*  NEUTROABS  --  0.8*  --   --   --   HGB 6.5* 10.8* 11.7* 11.2* 10.8*  HCT 19.0* 32.3* 35.6* 34.0* 33.6*  MCV  --  106.3* 108.2* 107.3* 108.4*  PLT  --  38* 34* 43* 43*   Cardiac Enzymes: No results found for this basename: CKTOTAL, CKMB, CKMBINDEX, TROPONINI,  in the last 168 hours BNP (last 3 results)  Recent Labs  05/31/13 2330 06/22/13 1738 06/30/13 2225  PROBNP 304.0* 296.3* 669.2*   CBG:  Recent Labs Lab 07/02/13 0729 07/02/13 1156 07/02/13 1716 07/02/13 2137 07/03/13 0718  GLUCAP 117* 169* 157* 99 123*    No results found for this or any previous visit (from the past 240 hour(s)).   Studies: Ir Fluoro Guide Cv Line Right  07/02/2013   CLINICAL DATA:  Cirrhosis, needs IV access for transfusion.  EXAM: PICC PLACEMENT WITH ULTRASOUND AND FLUOROSCOPY  FLUOROSCOPY TIME:  6 seconds  TECHNIQUE: After written informed consent was obtained, patient was placed in the supine position on angiographic table. Patency of the right tracheal vein was confirmed with ultrasound with image documentation. An appropriate skin site was determined. Skin site was marked. Region was prepped using maximum barrier technique including cap and mask, sterile gown, sterile gloves, large sterile sheet, and Chlorhexidine as cutaneous antisepsis. The region was infiltrated locally with 1% lidocaine.  Under real-time ultrasound guidance, the right tracheal vein was accessed with a 21 gauge micropuncture needle; the needle tip within the vein was confirmed with ultrasound image documentation. Needle exchanged over a 018 guidewire for a peel-away sheath, through which a 5-French double-lumen PICC trimmed to 36cm was advanced, positioned with its tip near the cavoatrial junction. Spot chest radiograph confirms appropriate catheter position. Catheter was flushed per protocol and secured externally with 0-Prolene sutures. The patient tolerated procedure well, with no immediate complication.  IMPRESSION: Technically successful five [place] double lumen  PICC placement   Electronically Signed   By: Arne Cleveland M.D.   On: 07/02/2013 12:56   Ir US Guide Vasc Access Right  07/02/2013   CLINICAL DATA:  Cirrhosis, needs IV access for transfusion.  EXAM: PICC PLACEMENT WITH ULTRASOUND AND FLUOROSCOPY  FLUOROSCOPY TIME:  6 seconds  TECHNIQUE: After written informed consent was obtained, patient was placed in the supine position on angiographic table. Patency of the right tracheal vein was confirmed with ultrasound with image documentation. An appropriate skin site was determined. Skin site was marked. Region was prepped using maximum barrier technique including cap and mask, sterile gown, sterile gloves, large sterile sheet, and Chlorhexidine as cutaneous antisepsis. The region was infiltrated locally with 1% lidocaine. Under real-time ultrasound guidance, the right tracheal vein was accessed with a 21 gauge micropuncture needle; the needle tip within the vein was confirmed with ultrasound image documentation. Needle exchanged over a 018 guidewire for a peel-away sheath, through which a 5-French double-lumen PICC trimmed to 36cm was advanced, positioned with its tip near the cavoatrial junction. Spot chest radiograph confirms appropriate catheter position. Catheter was flushed per protocol and secured externally with  0-Prolene sutures. The patient tolerated procedure well, with no immediate complication.  IMPRESSION: Technically successful five [place] double lumen  PICC placement   Electronically Signed   By: Arne Cleveland M.D.   On: 07/02/2013 12:56    Scheduled Meds: . ciprofloxacin  250 mg Oral BID  . ferrous sulfate  325 mg Oral QHS  . folic acid  1 mg Oral q morning - 10a  . furosemide  60 mg Oral TID WC  . insulin aspart  0-5 Units Subcutaneous QHS  . insulin aspart  0-9 Units Subcutaneous TID WC  . insulin glargine  12 Units Subcutaneous QHS  . lactulose  40 g Oral TID  . levETIRAcetam  1,000 mg Oral BID  . levothyroxine  75 mcg Oral QAC breakfast  . multivitamin with minerals  1 tablet Oral q morning - 10a  . nadolol  40 mg Oral q morning - 10a  . oxyCODONE  20 mg Oral 5 X Daily  . pantoprazole (PROTONIX) IV  40 mg Intravenous Q24H  . PARoxetine  20 mg Oral Daily  . rifaximin  550 mg Oral BID  . risperiDONE  0.25 mg Oral BID  . sodium chloride  3 mL Intravenous  Q12H  . spironolactone  200 mg Oral q morning - 10a   Continuous Infusions:   Antibiotics Given (last 72 hours)   Date/Time Action Medication Dose Rate   07/01/13 1010 Given   rifaximin (XIFAXAN) tablet 550 mg 550 mg    07/01/13 1044 Given   ciprofloxacin (CIPRO) IVPB 400 mg 400 mg 200 mL/hr   07/01/13 2036 Given   ciprofloxacin (CIPRO) tablet 250 mg 250 mg    07/01/13 2141 Given   rifaximin (XIFAXAN) tablet 550 mg 550 mg    07/02/13 9242 Given   ciprofloxacin (CIPRO) tablet 250 mg 250 mg    07/02/13 1147 Given   rifaximin (XIFAXAN) tablet 550 mg 550 mg    07/02/13 2004 Given   ciprofloxacin (CIPRO) tablet 250 mg 250 mg    07/02/13 2137 Given   rifaximin (XIFAXAN) tablet 550 mg 550 mg    07/03/13 0756 Given   ciprofloxacin (CIPRO) tablet 250 mg 250 mg    07/03/13 1011 Given   rifaximin (XIFAXAN) tablet 550 mg 550 mg       Principal Problem:   Symptomatic anemia Active Problems:   Cirrhosis   Hypothyroidism    Diabetes mellitus type 2, uncontrolled, with complications   Anasarca   Scrotal swelling   GIB (gastrointestinal bleeding)   Portal hypertensive gastropathy    Time spent: 67min    Shelly Shoultz  [facility] Pager [phone]. If 7PM-7AM, please contact night-coverage at [email or web address], password [facility] 07/03/2013, 11:04 AM  LOS: 3 days

## 2013-07-03 NOTE — Progress Notes (Signed)
Pt had an episode of 7 beats of V-tach. Pt was sleeping when occurred.  MD informed. No new orders. Will continue to monitor.

## 2013-07-03 NOTE — Progress Notes (Signed)
[facility] Progress Note  Subjective:  Feels a little better today.  Getting ready to work with PT.  Objective:  Vital signs in last 24 hours: Temp:  [97.8 F (36.6 C)-98.4 F (36.9 C)] 97.8 F (36.6 C) (05/08 0531) Pulse Rate:  [57-64] 64 (05/08 0531) Resp:  [16-17] 16 (05/08 0531) BP: (104-119)/(54-58) 119/54 mmHg (05/08 0531) SpO2:  [94 %-96 %] 94 % (05/08 0531) Weight:  [265 lb 9.6 oz (120.475 kg)] 265 lb 9.6 oz (120.475 kg) (05/08 0604) Last BM Date: 07/03/13 General:  Alert, chronically ill-appearing, in NAD Heart:  Regular rate and rhythm; no murmurs Pulm:  CTAB.  No W/R/R. Abdomen:  Soft, obese, slightly distended.  Normal bowel sounds.  Non-tender.  Multiple scars noted on abdomen. Extremities:  Significant edema noted with skin color changes secondary to venous stasis in B/L LE's. Neurologic:  Alert and  oriented x4;  grossly normal neurologically. Psych:  Alert and cooperative. Normal mood and affect.  Intake/Output from previous day: 05/07 0701 - 05/08 0700 In: 1080 [P.O.:1080] Out: 3800 [Urine:3800] Intake/Output this shift: Total I/O In: 240 [P.O.:240] Out: -   Lab Results:  Recent Labs  07/01/13 1612 07/01/13 2335 07/03/13 0510  WBC 2.5* 2.5* 2.8*  HGB 11.7* 11.2* 10.8*  HCT 35.6* 34.0* 33.6*  PLT 34* 43* 43*   BMET  Recent Labs  06/30/13 2231 06/30/13 2310 07/03/13 0510  NA 139 135* 138  K 3.9 3.9 3.8  CL 95* 97 97  CO2  --  24 36*  GLUCOSE 153* 154* 145*  BUN 6 7 7   CREATININE 0.80 0.59 0.58  CALCIUM  --  7.8* 8.0*   LFT  Recent Labs  06/30/13 2310  PROT 6.2  ALBUMIN 2.0*  AST 61*  ALT 22  ALKPHOS 209*  BILITOT 3.2*   PT/INR  Recent Labs  07/01/13 2335 07/03/13 0838  LABPROT 21.9* 22.1*  INR 1.98* 2.00*   Ir Fluoro Guide Cv Line Right  07/02/2013   CLINICAL DATA:  Cirrhosis, needs IV access for transfusion.  EXAM: PICC PLACEMENT WITH ULTRASOUND AND FLUOROSCOPY  FLUOROSCOPY TIME:  6 seconds  TECHNIQUE:  After written informed consent was obtained, patient was placed in the supine position on angiographic table. Patency of the right tracheal vein was confirmed with ultrasound with image documentation. An appropriate skin site was determined. Skin site was marked. Region was prepped using maximum barrier technique including cap and mask, sterile gown, sterile gloves, large sterile sheet, and Chlorhexidine as cutaneous antisepsis. The region was infiltrated locally with 1% lidocaine. Under real-time ultrasound guidance, the right tracheal vein was accessed with a 21 gauge micropuncture needle; the needle tip within the vein was confirmed with ultrasound image documentation. Needle exchanged over a 018 guidewire for a peel-away sheath, through which a 5-French double-lumen PICC trimmed to 36cm was advanced, positioned with its tip near the cavoatrial junction. Spot chest radiograph confirms appropriate catheter position. Catheter was flushed per protocol and secured externally with 0-Prolene sutures. The patient tolerated procedure well, with no immediate complication.  IMPRESSION: Technically successful five [place] double lumen  PICC placement   Electronically Signed   By: Arne Cleveland M.D.   On: 07/02/2013 12:56   Ir US Guide Vasc Access Right  07/02/2013   CLINICAL DATA:  Cirrhosis, needs IV access for transfusion.  EXAM: PICC PLACEMENT WITH ULTRASOUND AND FLUOROSCOPY  FLUOROSCOPY TIME:  6 seconds  TECHNIQUE: After written informed consent was obtained, patient was placed in the supine position on angiographic  table. Patency of the right tracheal vein was confirmed with ultrasound with image documentation. An appropriate skin site was determined. Skin site was marked. Region was prepped using maximum barrier technique including cap and mask, sterile gown, sterile gloves, large sterile sheet, and Chlorhexidine as cutaneous antisepsis. The region was infiltrated locally with 1% lidocaine. Under real-time  ultrasound guidance, the right tracheal vein was accessed with a 21 gauge micropuncture needle; the needle tip within the vein was confirmed with ultrasound image documentation. Needle exchanged over a 018 guidewire for a peel-away sheath, through which a 5-French double-lumen PICC trimmed to 36cm was advanced, positioned with its tip near the cavoatrial junction. Spot chest radiograph confirms appropriate catheter position. Catheter was flushed per protocol and secured externally with 0-Prolene sutures. The patient tolerated procedure well, with no immediate complication.  IMPRESSION: Technically successful five [place] double lumen  PICC placement   Electronically Signed   By: Arne Cleveland M.D.   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 MELD is approximately 18.  Poor prognosis.  -Acute drop in Hgb: Hgb was down from 10.9 grams to 6.5 grams in just 4 days. No evidence of overt GIB, but was heme positive in the ED. Hgb back up to 10.8 grams after just one unit of PRBC's and stable today at 10.8 grams today.  Suspect taht the initial value was erroneous.  -Active ETOH abuse   *He may need an EGD prior to discharge, but we need to correct his INR and need to see if his platelet count improves first. SQ Vitamin K ordered and dose given x 2 days without improvement in his coags.  *Monitor Hgb, coags, platelet count, LFT's and lytes.  *Continue BID IV PPI.  *Low (2 gram) sodium diet. *Needs ETOH abstinence.     LOS: 3 days   Tony Boyd. Tony Boyd  07/03/2013, 9:06 AM  Pager number [phone]

## 2013-07-03 NOTE — Progress Notes (Addendum)
Patient seen, examined, and I agree with the above documentation, including the assessment and plan. Stable Hgb, so evidence for GI bleeding Given lack of evidence of bleeding, his recent EGD, and the fact he is on variceal bleeding, EGD is not required for discharge More important at present is continued diuresis and work on ETOH abstinence Will be available over the weekend, call with questions.  Continue low Na diet.

## 2013-07-04 DIAGNOSIS — M549 Dorsalgia, unspecified: Secondary | ICD-10-CM

## 2013-07-04 DIAGNOSIS — G8929 Other chronic pain: Secondary | ICD-10-CM

## 2013-07-04 DIAGNOSIS — R079 Chest pain, unspecified: Secondary | ICD-10-CM

## 2013-07-04 LAB — TYPE AND SCREEN
ABO/RH(D): A POS
ANTIBODY SCREEN: NEGATIVE
UNIT DIVISION: 0
Unit division: 0

## 2013-07-04 LAB — BASIC METABOLIC PANEL
BUN: 9 mg/dL (ref 6–23)
CHLORIDE: 96 meq/L (ref 96–112)
CO2: 36 meq/L — AB (ref 19–32)
Calcium: 7.9 mg/dL — ABNORMAL LOW (ref 8.4–10.5)
Creatinine, Ser: 0.67 mg/dL (ref 0.50–1.35)
GFR calc Af Amer: >90 mL/min (ref >90)
GFR calc non Af Amer: >90 mL/min (ref >90)
Glucose, Bld: 112 mg/dL — ABNORMAL HIGH (ref 70–99)
Potassium: 3.8 mEq/L (ref 3.7–5.3)
Sodium: 136 mEq/L — ABNORMAL LOW (ref 137–147)

## 2013-07-04 LAB — GLUCOSE, CAPILLARY
Glucose-Capillary: 99 mg/dL (ref 70–99)
Glucose-Capillary: 175 mg/dL — ABNORMAL HIGH (ref 70–99)
Glucose-Capillary: 132 mg/dL — ABNORMAL HIGH (ref 70–99)
Glucose-Capillary: 145 mg/dL — ABNORMAL HIGH (ref 70–99)

## 2013-07-04 LAB — PROTIME-INR
INR: 2.08 — ABNORMAL HIGH (ref 0.00–1.49)
PROTHROMBIN TIME: 22.7 s — AB (ref 11.6–15.2)

## 2013-07-04 MED ORDER — SODIUM CHLORIDE 0.9 % IV BOLUS (SEPSIS)
250.0000 mL | Freq: Once | INTRAVENOUS | Status: AC
  Administered 2013-07-04: 250 mL via INTRAVENOUS

## 2013-07-04 MED ORDER — FUROSEMIDE 40 MG PO TABS
60.0000 mg | ORAL_TABLET | Freq: Two times a day (BID) | ORAL | Status: DC
  Administered 2013-07-04 – 2013-07-06 (×5): 60 mg via ORAL
  Filled 2013-07-04 (×6): qty 1

## 2013-07-04 NOTE — Progress Notes (Signed)
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  -continue lactulose and rifaximin -stable -BP dropped slightly overnight and was given FLuid bolus, will cut down lasix dose to BID  Anemia/heme positive stools -suspect hb of 6 was lab error since repeat after 1 unit PRBC was 10 -off octreotide, reported dark stools early this admission, now resolved -Hb stable -continue PPI -GI following, no plans for EGD at this time, FU outpatient -given Vitamin K for coagulopathy due to liver disease  DM -stable -continue lantus, SSI  Thrombocytopenia/coagulopathy -without much change -due to advanced liver disease -Vitamin K given 5/6 and 5/7  H/o seizure d/o -continue keppra  Scrotal swelling -due to third spacing/hypoalbuminema -good improvement -lasix, scrotal support, low sodium diet  Ambulate, PT  Code Status: Full Code Family Communication: none at bedside Disposition Plan: home    Consultants:  GI  HPI/Subjective: Feels ok, scrotal swelling improving  Objective: Filed Vitals:   07/04/13 0615  BP: 94/48  Pulse:   Temp:   Resp:     Intake/Output Summary (Last 24 hours) at 07/04/13 0853 Last data filed at 07/04/13 0649  Gross per 24 hour  Intake    980 ml  Output    617 ml  Net    363 ml   Filed Weights   07/02/13 0417 07/03/13 0604 07/04/13 0535  Weight: 122.108 kg (269 lb 3.2 oz) 120.475 kg (265 lb 9.6 oz) 117.799 kg (259 lb 11.2 oz)    Exam:   General:  AAOxto self, place, no distress  Cardiovascular: S1S2/RRR  Respiratory: S1S2/RRR  Abdomen: soft, obese, slightly distended, BS present  GU: scrotal swelling significantly better  Musculoskeletal: 1 plus edema  Data Reviewed: Basic Metabolic  Panel:  Recent Labs Lab 06/30/13 2231 06/30/13 2310 07/03/13 0510 07/04/13 0511  NA 139 135* 138 136*  K 3.9 3.9 3.8 3.8  CL 95* 97 97 96  CO2  --  24 36* 36*  GLUCOSE 153* 154* 145* 112*  BUN 6 7 7 9   CREATININE 0.80 0.59 0.58 0.67  CALCIUM  --  7.8* 8.0* 7.9*  MG  --   --  1.4*  --    Liver Function Tests:  Recent Labs Lab 06/30/13 2310  AST 61*  ALT 22  ALKPHOS 209*  BILITOT 3.2*  PROT 6.2  ALBUMIN 2.0*   No results found for this basename: LIPASE, AMYLASE,  in the last 168 hours No results found for this basename: AMMONIA,  in the last 168 hours CBC:  Recent Labs Lab 06/30/13 2231 07/01/13 0754 07/01/13 1612 07/01/13 2335 07/03/13 0510  WBC  --  2.0* 2.5* 2.5* 2.8*  NEUTROABS  --  0.8*  --   --   --   HGB 6.5* 10.8* 11.7* 11.2* 10.8*  HCT 19.0* 32.3* 35.6* 34.0* 33.6*  MCV  --  106.3* 108.2* 107.3* 108.4*  PLT  --  38* 34* 43* 43*   Cardiac Enzymes: No results found for this basename: CKTOTAL, CKMB, CKMBINDEX, TROPONINI,  in the last 168 hours BNP (last 3 results)  Recent Labs  05/31/13 2330 06/22/13 1738 06/30/13 2225  PROBNP 304.0* 296.3* 669.2*   CBG:  Recent Labs Lab 07/03/13 0718 07/03/13 1129 07/03/13 1701 07/03/13 2155 07/04/13 0727  GLUCAP 123* 132* 145* 155* 99  No results found for this or any previous visit (from the past 240 hour(s)).   Studies: Ir Fluoro Guide Cv Line Right  07/02/2013   CLINICAL DATA:  Cirrhosis, needs IV access for transfusion.  EXAM: PICC PLACEMENT WITH ULTRASOUND AND FLUOROSCOPY  FLUOROSCOPY TIME:  6 seconds  TECHNIQUE: After written informed consent was obtained, patient was placed in the supine position on angiographic table. Patency of the right tracheal vein was confirmed with ultrasound with image documentation. An appropriate skin site was determined. Skin site was marked. Region was prepped using maximum barrier technique including cap and mask, sterile gown, sterile gloves, large sterile sheet,  and Chlorhexidine as cutaneous antisepsis. The region was infiltrated locally with 1% lidocaine. Under real-time ultrasound guidance, the right tracheal vein was accessed with a 21 gauge micropuncture needle; the needle tip within the vein was confirmed with ultrasound image documentation. Needle exchanged over a 018 guidewire for a peel-away sheath, through which a 5-French double-lumen PICC trimmed to 36cm was advanced, positioned with its tip near the cavoatrial junction. Spot chest radiograph confirms appropriate catheter position. Catheter was flushed per protocol and secured externally with 0-Prolene sutures. The patient tolerated procedure well, with no immediate complication.  IMPRESSION: Technically successful five [place] double lumen  PICC placement   Electronically Signed   By: Arne Cleveland M.D.   On: 07/02/2013 12:56   Ir US Guide Vasc Access Right  07/02/2013   CLINICAL DATA:  Cirrhosis, needs IV access for transfusion.  EXAM: PICC PLACEMENT WITH ULTRASOUND AND FLUOROSCOPY  FLUOROSCOPY TIME:  6 seconds  TECHNIQUE: After written informed consent was obtained, patient was placed in the supine position on angiographic table. Patency of the right tracheal vein was confirmed with ultrasound with image documentation. An appropriate skin site was determined. Skin site was marked. Region was prepped using maximum barrier technique including cap and mask, sterile gown, sterile gloves, large sterile sheet, and Chlorhexidine as cutaneous antisepsis. The region was infiltrated locally with 1% lidocaine. Under real-time ultrasound guidance, the right tracheal vein was accessed with a 21 gauge micropuncture needle; the needle tip within the vein was confirmed with ultrasound image documentation. Needle exchanged over a 018 guidewire for a peel-away sheath, through which a 5-French double-lumen PICC trimmed to 36cm was advanced, positioned with its tip near the cavoatrial junction. Spot chest radiograph confirms  appropriate catheter position. Catheter was flushed per protocol and secured externally with 0-Prolene sutures. The patient tolerated procedure well, with no immediate complication.  IMPRESSION: Technically successful five [place] double lumen  PICC placement   Electronically Signed   By: Arne Cleveland M.D.   On: 07/02/2013 12:56    Scheduled Meds: . ciprofloxacin  250 mg Oral BID  . ferrous sulfate  325 mg Oral QHS  . folic acid  1 mg Oral q morning - 10a  . furosemide  60 mg Oral BID  . insulin aspart  0-5 Units Subcutaneous QHS  . insulin aspart  0-9 Units Subcutaneous TID WC  . insulin glargine  12 Units Subcutaneous QHS  . lactulose  40 g Oral TID  . levETIRAcetam  1,000 mg Oral BID  . levothyroxine  75 mcg Oral QAC breakfast  . multivitamin with minerals  1 tablet Oral q morning - 10a  . nadolol  40 mg Oral q morning - 10a  . oxyCODONE  20 mg Oral 5 X Daily  . pantoprazole (PROTONIX) IV  40 mg Intravenous Q24H  . PARoxetine  20 mg Oral Daily  .  rifaximin  550 mg Oral BID  . risperiDONE  0.25 mg Oral BID  . sodium chloride  3 mL Intravenous Q12H  . spironolactone  200 mg Oral q morning - 10a   Continuous Infusions:   Antibiotics Given (last 72 hours)   Date/Time Action Medication Dose Rate   07/01/13 1010 Given   rifaximin (XIFAXAN) tablet 550 mg 550 mg    07/01/13 1044 Given   ciprofloxacin (CIPRO) IVPB 400 mg 400 mg 200 mL/hr   07/01/13 2036 Given   ciprofloxacin (CIPRO) tablet 250 mg 250 mg    07/01/13 2141 Given   rifaximin (XIFAXAN) tablet 550 mg 550 mg    07/02/13 0838 Given   ciprofloxacin (CIPRO) tablet 250 mg 250 mg    07/02/13 1147 Given   rifaximin (XIFAXAN) tablet 550 mg 550 mg    07/02/13 2004 Given   ciprofloxacin (CIPRO) tablet 250 mg 250 mg    07/02/13 2137 Given   rifaximin (XIFAXAN) tablet 550 mg 550 mg    07/03/13 0756 Given   ciprofloxacin (CIPRO) tablet 250 mg 250 mg    07/03/13 1011 Given   rifaximin (XIFAXAN) tablet 550 mg 550 mg     07/03/13 2054 Given   ciprofloxacin (CIPRO) tablet 250 mg 250 mg    07/03/13 2105 Given   rifaximin (XIFAXAN) tablet 550 mg 550 mg    07/04/13 0747 Given   ciprofloxacin (CIPRO) tablet 250 mg 250 mg       Principal Problem:   Symptomatic anemia Active Problems:   Cirrhosis   Hypothyroidism   Diabetes mellitus type 2, uncontrolled, with complications   Anasarca   Scrotal swelling   GIB (gastrointestinal bleeding)   Portal hypertensive gastropathy    Time spent: 28min    Ludella Pranger  [facility] Pager [phone]. If 7PM-7AM, please contact night-coverage at [email or web address], password [email or web address] 07/04/2013, 8:53 AM  LOS: 4 days

## 2013-07-04 NOTE — Progress Notes (Signed)
BP low this AM at 93/63- patient complaining of dizziness/lightheadedness and "spacing out." Fredirick Maudlin, NP notified. 250 cc NS bolus over 1 hour ordered. BP retaken after bolus, still low at 94/48. Fredirick Maudlin, NP notified again. Another 250 cc NS bolus over 1 hour ordered and in process. Will continue to monitor patient.

## 2013-07-05 LAB — GLUCOSE, CAPILLARY
GLUCOSE-CAPILLARY: 134 mg/dL — AB (ref 70–99)
Glucose-Capillary: 112 mg/dL — ABNORMAL HIGH (ref 70–99)
Glucose-Capillary: 163 mg/dL — ABNORMAL HIGH (ref 70–99)

## 2013-07-05 LAB — BASIC METABOLIC PANEL
BUN: 8 mg/dL (ref 6–23)
CO2: 35 meq/L — AB (ref 19–32)
Calcium: 7.9 mg/dL — ABNORMAL LOW (ref 8.4–10.5)
Chloride: 98 mEq/L (ref 96–112)
Creatinine, Ser: 0.64 mg/dL (ref 0.50–1.35)
GFR calc Af Amer: >90 mL/min (ref >90)
GFR calc non Af Amer: >90 mL/min (ref >90)
GLUCOSE: 138 mg/dL — AB (ref 70–99)
Potassium: 3.8 mEq/L (ref 3.7–5.3)
Sodium: 138 mEq/L (ref 137–147)

## 2013-07-05 MED ORDER — FUROSEMIDE 20 MG PO TABS: 60.0000 mg | ORAL_TABLET | Freq: Two times a day (BID) | ORAL | Status: AC

## 2013-07-05 MED ORDER — LACTULOSE 10 GM/15ML PO SOLN: 40.0000 g | Freq: Three times a day (TID) | ORAL | Status: DC

## 2013-07-05 NOTE — Discharge Summary (Signed)
Physician Discharge Summary  Zeplin Aleshire BZJ:[id number] DOB: [birth year] DOA: 06/30/2013  PCP: Hollace Kinnier, DO  Admit date: 06/30/2013 Discharge date: 07/05/2013  Time spent: 45 minutes  Recommendations for Outpatient Follow-up:  1. Dr.Kaplan in 2 weeks 2. [facility] in 1 week 3. PCP Dr.Reid in 1 week 4. Resume Home health RN/PT  Discharge Diagnoses:  Principal Problem:   Symptomatic anemia Active Problems:   Cirrhosis   H/o Alcohol abuse   Hypothyroidism   Diabetes mellitus type 2, uncontrolled, with complications   Anasarca   Scrotal swelling   GIB (gastrointestinal bleeding)   Portal hypertensive gastropathy   Discharge Condition: stable  Diet recommendation: low sodium  Filed Weights   07/03/13 0604 07/04/13 0535 07/05/13 0520  Weight: 120.475 kg (265 lb 9.6 oz) 117.799 kg (259 lb 11.2 oz) 117.391 kg (258 lb 12.8 oz)    History of present illness:  Tony Boyd is a 58 y.o. male with Past medical history of cirrhosis, anasarca, hypothyroidism, portal hypertension gastropathy.  The patient presented with complaints of scrotal swelling. He mentions that he was recently admitted to the hospital for anasarca after which he was discharged on Lasix and Aldactone. He mentions he has been compliant with his medication despite which his scrotal swelling has not improved. This has been an ongoing issue since last 2 weeks given during his prior admission and he mentions it has been difficult for him to ambulate and sit down and perform his daily activities. He denies any shortness of breath, fever, chills, nausea, vomiting, abdominal pain, acid reflux, dizziness, lightheadedness.  He mentions she has been urinating good. He denies any diarrhea more than his usual. He denies any black color bowel movements. He has chronic leg swelling which he thinks has unchanged.  Patient denies any active alcohol drinking.  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  -continue lactulose and rifaximin  -stable now  Anemia/heme positive stools  -suspect hb of 6.5 on admission was lab error since repeat after 1 unit PRBC was 10  -was briefly on octreotide now off, reported dark stools early this admission, now resolved  -Hb stable  -continue PPI  -GI following, no plans for EGD at this time, FU outpatient with Dr.Kaplan -given Vitamin K for coagulopathy due to liver disease without any change in INR.  DM  -stable  -continue lantus, SSI   Thrombocytopenia/coagulopathy  -without much change  -due to advanced liver disease  -Vitamin K given 5/6 and 5/7   H/o seizure d/o  -continue keppra   Scrotal swelling  -due to third spacing/hypoalbuminema  -good improvement  -lasix, scrotal support, low sodium diet  Consultations:  GI -Dr.Pyrtle  Discharge Exam: Filed Vitals:   07/05/13 0520  BP: 121/60  Pulse: 70  Temp: 98.5 F (36.9 C)  Resp: 20    General: AAOx3 Cardiovascular: S1S2/RRR Respiratory: CTAB  Discharge Instructions You were cared for by a hospitalist during your hospital stay. If you have any questions about your discharge medications or the care you received while you were in the hospital after you are discharged, you can call the unit and asked to speak with the hospitalist on call if the hospitalist that took care of you is not available. Once you are discharged, your primary care physician will handle any further medical issues. Please note that NO REFILLS for any discharge medications will  be authorized once you are discharged, as it is imperative that you return to your primary care physician (or establish a relationship with a primary care physician if you do not have one) for your aftercare needs so that they can reassess your need for medications and monitor your lab values.  Discharge Orders   Future  Appointments Provider Department Dept Phone   07/23/2013 2:45 PM Pricilla Larsson, NP [facility] [phone]   09/09/2013 3:30 PM Pricilla Larsson, NP [facility] [phone]   Future Orders Complete By Expires   Diet - low sodium heart healthy  As directed    Increase activity slowly  As directed        Medication List         albuterol 108 (90 BASE) MCG/ACT inhaler  Commonly known as:  PROVENTIL HFA;VENTOLIN HFA  Inhale 1-2 puffs into the lungs every 6 (six) hours as needed for wheezing or shortness of breath.     ALPRAZolam 1 MG tablet  Commonly known as:  XANAX  Take 1 tablet (1 mg total) by mouth at bedtime as needed for anxiety.     ferrous sulfate 325 (65 FE) MG tablet  Take 1 tablet (325 mg total) by mouth at bedtime.     folic acid 1 MG tablet  Commonly known as:  FOLVITE  Take 1 tablet (1 mg total) by mouth every morning.     furosemide 20 MG tablet  Commonly known as:  LASIX  Take 3 tablets (60 mg total) by mouth 2 (two) times daily.     insulin glargine 100 UNIT/ML injection  Commonly known as:  LANTUS  Inject 0.12 mLs (12 Units total) into the skin at bedtime.     lactulose 10 GM/15ML solution  Commonly known as:  CHRONULAC  Take 60 mLs (40 g total) by mouth 3 (three) times daily.     levETIRAcetam 500 MG tablet  Commonly known as:  KEPPRA  Take 2 tablets (1,000 mg total) by mouth 2 (two) times daily.     levothyroxine 75 MCG tablet  Commonly known as:  SYNTHROID, LEVOTHROID  Take 75 mcg by mouth daily before breakfast.     multivitamin with minerals Tabs tablet  Take 1 tablet by mouth every morning.     nadolol 40 MG tablet  Commonly known as:  CORGARD  Take 1 tablet (40 mg total) by mouth every morning.     omeprazole 40 MG capsule  Commonly known as:  PRILOSEC  Take 1 capsule (40 mg total) by mouth daily.     ondansetron 4 MG tablet  Commonly known as:  ZOFRAN  Take 1 tablet (4 mg total) by mouth every 8 (eight) hours as  needed for nausea or vomiting.     oxyCODONE 5 MG immediate release tablet  Commonly known as:  Oxy IR/ROXICODONE  Take 20 mg by mouth 5 (five) times daily. Take 1 tablet at 6am, 10am, 2pm, 5pm, 9pm.     PARoxetine 20 MG tablet  Commonly known as:  PAXIL  Take 1 tablet (20 mg total) by mouth daily.     rifaximin 550 MG Tabs tablet  Commonly known as:  XIFAXAN  Take 1 tablet (550 mg total) by mouth 2 (two) times daily.     risperiDONE 0.25 MG tablet  Commonly known as:  RISPERDAL  Take 1 tablet (0.25 mg total) by mouth 2 (two) times daily.     spironolactone 100 MG tablet  Commonly  known as:  ALDACTONE  Take 2 tablets (200 mg total) by mouth every morning.       Allergies  Allergen Reactions  . Ativan [Lorazepam] Other (See Comments)    Makes patient very weak and cant mentate appropriately  . Tylenol [Acetaminophen] Other (See Comments)    Aggravates liver problems  . Droperidol Hives  . Penicillins Swelling  . Toradol [Ketorolac Tromethamine] Hives      The results of significant diagnostics from this hospitalization (including imaging, microbiology, ancillary and laboratory) are listed below for reference.    Significant Diagnostic Studies: Dg Chest 2 View  06/22/2013   CLINICAL DATA:  The fluid retention in the abdomen and also scrotal swelling  EXAM: CHEST  2 VIEW  COMPARISON:  DG CHEST 2 VIEW dated 05/31/2013; IR US GUIDE VASC ACCESS*R* dated 10/13/2012  FINDINGS: The lungs are adequately inflated. The pulmonary interstitial markings are mildly increased. There is a small left pleural effusion . The cardiopericardial silhouette remains enlarged. The pulmonary vascularity is engorged. The trachea is midline. The mediastinum is normal in width.  IMPRESSION: The findings are consistent with congestive heart failure with mild pulmonary interstitial edema and pain small left pleural effusion.   Electronically Signed   By: David  Martinique   On: 06/22/2013 17:46   US Abdomen  Limited  06/23/2013   CLINICAL DATA:  Cirrhosis, question ascites  EXAM: LIMITED ABDOMEN ULTRASOUND FOR ASCITES  TECHNIQUE: Limited ultrasound survey for ascites was performed in all four abdominal quadrants.  COMPARISON:  02/18/2013  FINDINGS: Survey imaging of the 4 quadrants was performed.  No ascites identified.  IMPRESSION: No ascites identified.   Electronically Signed   By: Lavonia Dana M.D.   On: 06/23/2013 11:10   Ir Fluoro Guide Cv Line Right  07/02/2013   CLINICAL DATA:  Cirrhosis, needs IV access for transfusion.  EXAM: PICC PLACEMENT WITH ULTRASOUND AND FLUOROSCOPY  FLUOROSCOPY TIME:  6 seconds  TECHNIQUE: After written informed consent was obtained, patient was placed in the supine position on angiographic table. Patency of the right tracheal vein was confirmed with ultrasound with image documentation. An appropriate skin site was determined. Skin site was marked. Region was prepped using maximum barrier technique including cap and mask, sterile gown, sterile gloves, large sterile sheet, and Chlorhexidine as cutaneous antisepsis. The region was infiltrated locally with 1% lidocaine. Under real-time ultrasound guidance, the right tracheal vein was accessed with a 21 gauge micropuncture needle; the needle tip within the vein was confirmed with ultrasound image documentation. Needle exchanged over a 018 guidewire for a peel-away sheath, through which a 5-French double-lumen PICC trimmed to 36cm was advanced, positioned with its tip near the cavoatrial junction. Spot chest radiograph confirms appropriate catheter position. Catheter was flushed per protocol and secured externally with 0-Prolene sutures. The patient tolerated procedure well, with no immediate complication.  IMPRESSION: Technically successful five [place] double lumen  PICC placement   Electronically Signed   By: Arne Cleveland M.D.   On: 07/02/2013 12:56   Ir US Guide Vasc Access Right  07/02/2013   CLINICAL DATA:  Cirrhosis, needs IV  access for transfusion.  EXAM: PICC PLACEMENT WITH ULTRASOUND AND FLUOROSCOPY  FLUOROSCOPY TIME:  6 seconds  TECHNIQUE: After written informed consent was obtained, patient was placed in the supine position on angiographic table. Patency of the right tracheal vein was confirmed with ultrasound with image documentation. An appropriate skin site was determined. Skin site was marked. Region was prepped using maximum barrier technique including  cap and mask, sterile gown, sterile gloves, large sterile sheet, and Chlorhexidine as cutaneous antisepsis. The region was infiltrated locally with 1% lidocaine. Under real-time ultrasound guidance, the right tracheal vein was accessed with a 21 gauge micropuncture needle; the needle tip within the vein was confirmed with ultrasound image documentation. Needle exchanged over a 018 guidewire for a peel-away sheath, through which a 5-French double-lumen PICC trimmed to 36cm was advanced, positioned with its tip near the cavoatrial junction. Spot chest radiograph confirms appropriate catheter position. Catheter was flushed per protocol and secured externally with 0-Prolene sutures. The patient tolerated procedure well, with no immediate complication.  IMPRESSION: Technically successful five [place] double lumen  PICC placement   Electronically Signed   By: Arne Cleveland M.D.   On: 07/02/2013 12:56   Dg Chest Portable 1 View  06/30/2013   CLINICAL DATA:  Swelling of the legs.  Shortness of breath.  EXAM: PORTABLE CHEST - 1 VIEW  COMPARISON:  06/22/2013  FINDINGS: Chronic cardiopericardial enlargement. Unremarkable upper mediastinal contours for technique. Enlarged central pulmonary vasculature with cephalized blood flow. An apparent rounded nodular area overlapping the posterior right fourth rib is likely projectional given this region was clear on departmental exam 06/22/2013. No evidence of effusion or pneumothorax.  IMPRESSION: Mild CHF.   Electronically Signed   By: Jorje Guild M.D.   On: 06/30/2013 22:56    Microbiology: No results found for this or any previous visit (from the past 240 hour(s)).   Labs: Basic Metabolic Panel:  Recent Labs Lab 06/30/13 2231 06/30/13 2310 07/03/13 0510 07/04/13 0511 07/05/13 0529  NA 139 135* 138 136* 138  K 3.9 3.9 3.8 3.8 3.8  CL 95* 97 97 96 98  CO2  --  24 36* 36* 35*  GLUCOSE 153* 154* 145* 112* 138*  BUN 6 7 7 9 8   CREATININE 0.80 0.59 0.58 0.67 0.64  CALCIUM  --  7.8* 8.0* 7.9* 7.9*  MG  --   --  1.4*  --   --    Liver Function Tests:  Recent Labs Lab 06/30/13 2310  AST 61*  ALT 22  ALKPHOS 209*  BILITOT 3.2*  PROT 6.2  ALBUMIN 2.0*   No results found for this basename: LIPASE, AMYLASE,  in the last 168 hours No results found for this basename: AMMONIA,  in the last 168 hours CBC:  Recent Labs Lab 06/30/13 2231 07/01/13 0754 07/01/13 1612 07/01/13 2335 07/03/13 0510  WBC  --  2.0* 2.5* 2.5* 2.8*  NEUTROABS  --  0.8*  --   --   --   HGB 6.5* 10.8* 11.7* 11.2* 10.8*  HCT 19.0* 32.3* 35.6* 34.0* 33.6*  MCV  --  106.3* 108.2* 107.3* 108.4*  PLT  --  38* 34* 43* 43*   Cardiac Enzymes: No results found for this basename: CKTOTAL, CKMB, CKMBINDEX, TROPONINI,  in the last 168 hours BNP: BNP (last 3 results)  Recent Labs  05/31/13 2330 06/22/13 1738 06/30/13 2225  PROBNP 304.0* 296.3* 669.2*   CBG:  Recent Labs Lab 07/04/13 0727 07/04/13 1145 07/04/13 1712 07/04/13 2108 07/05/13 0738  GLUCAP 99 175* 132* 145* 112*       Signed:  [name]  [facility] 07/05/2013, 11:07 AM

## 2013-07-06 LAB — GLUCOSE, CAPILLARY
Glucose-Capillary: 111 mg/dL — ABNORMAL HIGH (ref 70–99)
Glucose-Capillary: 120 mg/dL — ABNORMAL HIGH (ref 70–99)
Glucose-Capillary: 151 mg/dL — ABNORMAL HIGH (ref 70–99)
Glucose-Capillary: 171 mg/dL — ABNORMAL HIGH (ref 70–99)

## 2013-07-06 NOTE — Progress Notes (Signed)
Discharged to home, p/u by daughter. PICC insertion site dressing dry and intact no s/s of bleeding, still bruised around site. D/c instructions and follow up appointments done verbalized understanding.

## 2013-07-06 NOTE — Progress Notes (Signed)
Patient has been discharge to home since this morning. Has been telling me that daughter will pick him up after lunch, then @ 2pm then @ 5pm  And now he said daughter will pick him up @6pm . Tried calling his daughters Barbette Hair [phone] and daughter Levin Erp [phone],  But no answer, left messages.

## 2013-07-07 ENCOUNTER — Other Ambulatory Visit: Payer: Self-pay | Admitting: Internal Medicine

## 2013-07-07 DIAGNOSIS — D649 Anemia, unspecified: Secondary | ICD-10-CM

## 2013-07-07 DIAGNOSIS — B182 Chronic viral hepatitis C: Secondary | ICD-10-CM

## 2013-07-07 DIAGNOSIS — K7030 Alcoholic cirrhosis of liver without ascites: Secondary | ICD-10-CM

## 2013-07-07 DIAGNOSIS — K766 Portal hypertension: Secondary | ICD-10-CM

## 2013-07-10 ENCOUNTER — Telehealth: Payer: Self-pay | Admitting: *Deleted

## 2013-07-10 ENCOUNTER — Other Ambulatory Visit: Payer: Self-pay | Admitting: *Deleted

## 2013-07-10 NOTE — Telephone Encounter (Signed)
If his old prescription was equivalent to this (20mg  5x daily) then this is ok.  If they reduced his dose, I would not change it back.

## 2013-07-10 NOTE — Telephone Encounter (Signed)
Would like the old Rx back because the insurance will not pay for that many 5mg  ( 4 tablets five times daily) Oxycodone. Was changed to this in the Hospital. Would like to pick up Rx on Monday

## 2013-07-13 MED ORDER — OXYCODONE HCL 10 MG PO TABS: ORAL_TABLET | ORAL | Status: DC

## 2013-07-13 NOTE — Telephone Encounter (Signed)
Spoke with patient's daughter, rx is available for pick-up. Per Dr.Reed patient to get UDS (urine drug screen at next OV with [name] on 07/23/2013)

## 2013-07-15 NOTE — Telephone Encounter (Signed)
[facility] pharmacy called.  Pt's drug plan only permitted him to receive 180 oxycodone tablets so this is what was dispensed and they wanted me to be aware of this change as we had written for 300.

## 2013-07-21 ENCOUNTER — Other Ambulatory Visit: Payer: Self-pay | Admitting: Internal Medicine

## 2013-07-23 ENCOUNTER — Encounter (HOSPITAL_COMMUNITY): Payer: Self-pay | Admitting: Emergency Medicine

## 2013-07-23 ENCOUNTER — Emergency Department (HOSPITAL_COMMUNITY): Payer: Medicare Other

## 2013-07-23 ENCOUNTER — Ambulatory Visit: Payer: Self-pay | Admitting: Nurse Practitioner

## 2013-07-23 ENCOUNTER — Inpatient Hospital Stay (HOSPITAL_COMMUNITY)
Admission: EM | Admit: 2013-07-23 | Discharge: 2013-08-07 | DRG: 956 | Disposition: A | Discharge status: Skilled Nursing Facility | Payer: Medicare Other | Attending: Internal Medicine | Admitting: Internal Medicine

## 2013-07-23 DIAGNOSIS — K7682 Hepatic encephalopathy: Secondary | ICD-10-CM

## 2013-07-23 DIAGNOSIS — Z8249 Family history of ischemic heart disease and other diseases of the circulatory system: Secondary | ICD-10-CM

## 2013-07-23 DIAGNOSIS — K7030 Alcoholic cirrhosis of liver without ascites: Secondary | ICD-10-CM | POA: Diagnosis present

## 2013-07-23 DIAGNOSIS — E1165 Type 2 diabetes mellitus with hyperglycemia: Secondary | ICD-10-CM | POA: Diagnosis present

## 2013-07-23 DIAGNOSIS — F10229 Alcohol dependence with intoxication, unspecified: Secondary | ICD-10-CM | POA: Diagnosis present

## 2013-07-23 DIAGNOSIS — E669 Obesity, unspecified: Secondary | ICD-10-CM | POA: Diagnosis present

## 2013-07-23 DIAGNOSIS — K7290 Hepatic failure, unspecified without coma: Secondary | ICD-10-CM

## 2013-07-23 DIAGNOSIS — I8510 Secondary esophageal varices without bleeding: Secondary | ICD-10-CM | POA: Diagnosis present

## 2013-07-23 DIAGNOSIS — Y92009 Unspecified place in unspecified non-institutional (private) residence as the place of occurrence of the external cause: Secondary | ICD-10-CM

## 2013-07-23 DIAGNOSIS — J9600 Acute respiratory failure, unspecified whether with hypoxia or hypercapnia: Secondary | ICD-10-CM | POA: Diagnosis not present

## 2013-07-23 DIAGNOSIS — Z9181 History of falling: Secondary | ICD-10-CM

## 2013-07-23 DIAGNOSIS — B1920 Unspecified viral hepatitis C without hepatic coma: Secondary | ICD-10-CM | POA: Diagnosis present

## 2013-07-23 DIAGNOSIS — D7589 Other specified diseases of blood and blood-forming organs: Secondary | ICD-10-CM | POA: Diagnosis present

## 2013-07-23 DIAGNOSIS — E1142 Type 2 diabetes mellitus with diabetic polyneuropathy: Secondary | ICD-10-CM | POA: Diagnosis present

## 2013-07-23 DIAGNOSIS — D62 Acute posthemorrhagic anemia: Secondary | ICD-10-CM | POA: Diagnosis not present

## 2013-07-23 DIAGNOSIS — Z87891 Personal history of nicotine dependence: Secondary | ICD-10-CM

## 2013-07-23 DIAGNOSIS — S72142A Displaced intertrochanteric fracture of left femur, initial encounter for closed fracture: Secondary | ICD-10-CM | POA: Diagnosis present

## 2013-07-23 DIAGNOSIS — I739 Peripheral vascular disease, unspecified: Secondary | ICD-10-CM | POA: Diagnosis present

## 2013-07-23 DIAGNOSIS — E1149 Type 2 diabetes mellitus with other diabetic neurological complication: Secondary | ICD-10-CM | POA: Diagnosis present

## 2013-07-23 DIAGNOSIS — Z886 Allergy status to analgesic agent status: Secondary | ICD-10-CM

## 2013-07-23 DIAGNOSIS — J9690 Respiratory failure, unspecified, unspecified whether with hypoxia or hypercapnia: Secondary | ICD-10-CM | POA: Diagnosis not present

## 2013-07-23 DIAGNOSIS — F411 Generalized anxiety disorder: Secondary | ICD-10-CM | POA: Diagnosis present

## 2013-07-23 DIAGNOSIS — Z794 Long term (current) use of insulin: Secondary | ICD-10-CM

## 2013-07-23 DIAGNOSIS — W19XXXA Unspecified fall, initial encounter: Secondary | ICD-10-CM

## 2013-07-23 DIAGNOSIS — I959 Hypotension, unspecified: Secondary | ICD-10-CM | POA: Diagnosis not present

## 2013-07-23 DIAGNOSIS — R188 Other ascites: Secondary | ICD-10-CM | POA: Diagnosis present

## 2013-07-23 DIAGNOSIS — Z79899 Other long term (current) drug therapy: Secondary | ICD-10-CM

## 2013-07-23 DIAGNOSIS — Z9119 Patient's noncompliance with other medical treatment and regimen: Secondary | ICD-10-CM

## 2013-07-23 DIAGNOSIS — F1010 Alcohol abuse, uncomplicated: Secondary | ICD-10-CM

## 2013-07-23 DIAGNOSIS — W010XXA Fall on same level from slipping, tripping and stumbling without subsequent striking against object, initial encounter: Secondary | ICD-10-CM | POA: Diagnosis present

## 2013-07-23 DIAGNOSIS — N5089 Other specified disorders of the male genital organs: Secondary | ICD-10-CM | POA: Diagnosis present

## 2013-07-23 DIAGNOSIS — D696 Thrombocytopenia, unspecified: Secondary | ICD-10-CM

## 2013-07-23 DIAGNOSIS — B1921 Unspecified viral hepatitis C with hepatic coma: Secondary | ICD-10-CM | POA: Diagnosis present

## 2013-07-23 DIAGNOSIS — IMO0002 Reserved for concepts with insufficient information to code with codable children: Secondary | ICD-10-CM | POA: Diagnosis present

## 2013-07-23 DIAGNOSIS — K766 Portal hypertension: Secondary | ICD-10-CM | POA: Diagnosis present

## 2013-07-23 DIAGNOSIS — S72143A Displaced intertrochanteric fracture of unspecified femur, initial encounter for closed fracture: Principal | ICD-10-CM | POA: Diagnosis present

## 2013-07-23 DIAGNOSIS — Z9089 Acquired absence of other organs: Secondary | ICD-10-CM

## 2013-07-23 DIAGNOSIS — F1994 Other psychoactive substance use, unspecified with psychoactive substance-induced mood disorder: Secondary | ICD-10-CM | POA: Diagnosis present

## 2013-07-23 DIAGNOSIS — D638 Anemia in other chronic diseases classified elsewhere: Secondary | ICD-10-CM

## 2013-07-23 DIAGNOSIS — E8809 Other disorders of plasma-protein metabolism, not elsewhere classified: Secondary | ICD-10-CM | POA: Diagnosis present

## 2013-07-23 DIAGNOSIS — S27329A Contusion of lung, unspecified, initial encounter: Secondary | ICD-10-CM | POA: Diagnosis present

## 2013-07-23 DIAGNOSIS — N390 Urinary tract infection, site not specified: Secondary | ICD-10-CM | POA: Diagnosis not present

## 2013-07-23 DIAGNOSIS — G8929 Other chronic pain: Secondary | ICD-10-CM | POA: Diagnosis present

## 2013-07-23 DIAGNOSIS — D61818 Other pancytopenia: Secondary | ICD-10-CM | POA: Diagnosis present

## 2013-07-23 DIAGNOSIS — K7460 Unspecified cirrhosis of liver: Secondary | ICD-10-CM | POA: Diagnosis present

## 2013-07-23 DIAGNOSIS — E876 Hypokalemia: Secondary | ICD-10-CM | POA: Diagnosis not present

## 2013-07-23 DIAGNOSIS — G40909 Epilepsy, unspecified, not intractable, without status epilepticus: Secondary | ICD-10-CM | POA: Diagnosis present

## 2013-07-23 DIAGNOSIS — F4310 Post-traumatic stress disorder, unspecified: Secondary | ICD-10-CM | POA: Diagnosis present

## 2013-07-23 DIAGNOSIS — K769 Liver disease, unspecified: Secondary | ICD-10-CM | POA: Diagnosis present

## 2013-07-23 DIAGNOSIS — Z880 Allergy status to penicillin: Secondary | ICD-10-CM

## 2013-07-23 DIAGNOSIS — N179 Acute kidney failure, unspecified: Secondary | ICD-10-CM | POA: Diagnosis not present

## 2013-07-23 DIAGNOSIS — K319 Disease of stomach and duodenum, unspecified: Secondary | ICD-10-CM | POA: Diagnosis present

## 2013-07-23 DIAGNOSIS — F319 Bipolar disorder, unspecified: Secondary | ICD-10-CM | POA: Diagnosis present

## 2013-07-23 DIAGNOSIS — R601 Generalized edema: Secondary | ICD-10-CM

## 2013-07-23 DIAGNOSIS — M549 Dorsalgia, unspecified: Secondary | ICD-10-CM

## 2013-07-23 DIAGNOSIS — M51379 Other intervertebral disc degeneration, lumbosacral region without mention of lumbar back pain or lower extremity pain: Secondary | ICD-10-CM | POA: Diagnosis present

## 2013-07-23 DIAGNOSIS — Z888 Allergy status to other drugs, medicaments and biological substances status: Secondary | ICD-10-CM

## 2013-07-23 DIAGNOSIS — K219 Gastro-esophageal reflux disease without esophagitis: Secondary | ICD-10-CM | POA: Diagnosis present

## 2013-07-23 DIAGNOSIS — E8779 Other fluid overload: Secondary | ICD-10-CM | POA: Diagnosis not present

## 2013-07-23 DIAGNOSIS — G9340 Encephalopathy, unspecified: Secondary | ICD-10-CM | POA: Diagnosis present

## 2013-07-23 DIAGNOSIS — M5137 Other intervertebral disc degeneration, lumbosacral region: Secondary | ICD-10-CM | POA: Diagnosis present

## 2013-07-23 DIAGNOSIS — E039 Hypothyroidism, unspecified: Secondary | ICD-10-CM | POA: Diagnosis present

## 2013-07-23 DIAGNOSIS — R4182 Altered mental status, unspecified: Secondary | ICD-10-CM

## 2013-07-23 DIAGNOSIS — R531 Weakness: Secondary | ICD-10-CM

## 2013-07-23 DIAGNOSIS — E440 Moderate protein-calorie malnutrition: Secondary | ICD-10-CM | POA: Diagnosis present

## 2013-07-23 DIAGNOSIS — Z91199 Patient's noncompliance with other medical treatment and regimen due to unspecified reason: Secondary | ICD-10-CM

## 2013-07-23 DIAGNOSIS — E118 Type 2 diabetes mellitus with unspecified complications: Secondary | ICD-10-CM

## 2013-07-23 DIAGNOSIS — Z801 Family history of malignant neoplasm of trachea, bronchus and lung: Secondary | ICD-10-CM

## 2013-07-23 DIAGNOSIS — N189 Chronic kidney disease, unspecified: Secondary | ICD-10-CM | POA: Diagnosis present

## 2013-07-23 DIAGNOSIS — K7210 Chronic hepatic failure without coma: Secondary | ICD-10-CM

## 2013-07-23 LAB — CBC WITH DIFFERENTIAL/PLATELET
BASOS PCT: 0 % (ref 0–1)
Basophils Absolute: 0 10*3/uL (ref 0.0–0.1)
EOS ABS: 0 10*3/uL (ref 0.0–0.7)
Eosinophils Relative: 0 % (ref 0–5)
HEMATOCRIT: 29.1 % — AB (ref 39.0–52.0)
Hemoglobin: 9.9 g/dL — ABNORMAL LOW (ref 13.0–17.0)
Lymphocytes Relative: 8 % — ABNORMAL LOW (ref 12–46)
Lymphs Abs: 0.6 10*3/uL — ABNORMAL LOW (ref 0.7–4.0)
MCH: 35.7 pg — AB (ref 26.0–34.0)
MCHC: 34 g/dL (ref 30.0–36.0)
MCV: 105.1 fL — ABNORMAL HIGH (ref 78.0–100.0)
MONO ABS: 0.7 10*3/uL (ref 0.1–1.0)
Monocytes Relative: 8 % (ref 3–12)
NEUTROS ABS: 6.9 10*3/uL (ref 1.7–7.7)
NEUTROS PCT: 84 % — AB (ref 43–77)
Platelets: 42 10*3/uL — ABNORMAL LOW (ref 150–400)
RBC: 2.77 MIL/uL — AB (ref 4.22–5.81)
RDW: 16.7 % — ABNORMAL HIGH (ref 11.5–15.5)
WBC: 8.2 10*3/uL (ref 4.0–10.5)

## 2013-07-23 LAB — PROTIME-INR
INR: 2.22 — ABNORMAL HIGH (ref 0.00–1.49)
PROTHROMBIN TIME: 23.9 s — AB (ref 11.6–15.2)

## 2013-07-23 LAB — COMPREHENSIVE METABOLIC PANEL
ALBUMIN: 1.9 g/dL — AB (ref 3.5–5.2)
ALT: 16 U/L (ref 0–53)
AST: 43 U/L — ABNORMAL HIGH (ref 0–37)
Alkaline Phosphatase: 168 U/L — ABNORMAL HIGH (ref 39–117)
BUN: 9 mg/dL (ref 6–23)
CHLORIDE: 98 meq/L (ref 96–112)
CO2: 31 mEq/L (ref 19–32)
CREATININE: 0.49 mg/dL — AB (ref 0.50–1.35)
Calcium: 7.6 mg/dL — ABNORMAL LOW (ref 8.4–10.5)
GFR calc Af Amer: >90 mL/min (ref >90)
GFR calc non Af Amer: >90 mL/min (ref >90)
GLUCOSE: 151 mg/dL — AB (ref 70–99)
POTASSIUM: 3.9 meq/L (ref 3.7–5.3)
Sodium: 138 mEq/L (ref 137–147)
Total Bilirubin: 5.5 mg/dL — ABNORMAL HIGH (ref 0.3–1.2)
Total Protein: 5.8 g/dL — ABNORMAL LOW (ref 6.0–8.3)

## 2013-07-23 LAB — URINALYSIS, ROUTINE W REFLEX MICROSCOPIC
GLUCOSE, UA: NEGATIVE mg/dL
Hgb urine dipstick: NEGATIVE
Ketones, ur: NEGATIVE mg/dL
Leukocytes, UA: NEGATIVE
Nitrite: NEGATIVE
PROTEIN: NEGATIVE mg/dL
Specific Gravity, Urine: 1.014 (ref 1.005–1.030)
UROBILINOGEN UA: 4 mg/dL — AB (ref 0.0–1.0)
pH: 5 (ref 5.0–8.0)

## 2013-07-23 LAB — AMMONIA: AMMONIA: 31 umol/L (ref 11–60)

## 2013-07-23 LAB — ETHANOL: ALCOHOL ETHYL (B): 62 mg/dL — AB (ref 0–11)

## 2013-07-23 LAB — CBG MONITORING, ED
Glucose-Capillary: 156 mg/dL — ABNORMAL HIGH (ref 70–99)
Glucose-Capillary: 136 mg/dL — ABNORMAL HIGH (ref 70–99)
Glucose-Capillary: 134 mg/dL — ABNORMAL HIGH (ref 70–99)

## 2013-07-23 LAB — CK: CK TOTAL: 238 U/L — AB (ref 7–232)

## 2013-07-23 MED ORDER — LEVETIRACETAM 500 MG PO TABS
1000.0000 mg | ORAL_TABLET | Freq: Two times a day (BID) | ORAL | Status: DC
  Administered 2013-07-24: 1000 mg via ORAL
  Filled 2013-07-23 (×4): qty 2

## 2013-07-23 MED ORDER — VITAMIN B-1 100 MG PO TABS
100.0000 mg | ORAL_TABLET | Freq: Every day | ORAL | Status: DC
  Filled 2013-07-23: qty 1

## 2013-07-23 MED ORDER — FERROUS SULFATE 325 (65 FE) MG PO TABS
325.0000 mg | ORAL_TABLET | Freq: Every day | ORAL | Status: DC
  Administered 2013-07-24: 325 mg via ORAL
  Filled 2013-07-23 (×4): qty 1

## 2013-07-23 MED ORDER — ALBUTEROL SULFATE (2.5 MG/3ML) 0.083% IN NEBU: 2.5000 mg | INHALATION_SOLUTION | Freq: Four times a day (QID) | RESPIRATORY_TRACT | Status: DC | PRN

## 2013-07-23 MED ORDER — ADULT MULTIVITAMIN W/MINERALS CH
1.0000 | ORAL_TABLET | Freq: Every morning | ORAL | Status: DC
  Administered 2013-07-25 – 2013-08-07 (×14): 1 via ORAL
  Filled 2013-07-23 (×15): qty 1

## 2013-07-23 MED ORDER — FOLIC ACID 1 MG PO TABS
1.0000 mg | ORAL_TABLET | Freq: Every morning | ORAL | Status: DC
  Filled 2013-07-23: qty 1

## 2013-07-23 MED ORDER — ONDANSETRON HCL 4 MG PO TABS: 4.0000 mg | ORAL_TABLET | Freq: Three times a day (TID) | ORAL | Status: DC | PRN

## 2013-07-23 MED ORDER — RISPERIDONE 0.25 MG PO TABS
0.2500 mg | ORAL_TABLET | Freq: Two times a day (BID) | ORAL | Status: DC
  Administered 2013-07-24 – 2013-07-27 (×7): 0.25 mg via ORAL
  Filled 2013-07-23 (×11): qty 1

## 2013-07-23 MED ORDER — LORAZEPAM 1 MG PO TABS: 0.0000 mg | ORAL_TABLET | Freq: Two times a day (BID) | ORAL | Status: DC

## 2013-07-23 MED ORDER — MORPHINE SULFATE 2 MG/ML IJ SOLN
0.5000 mg | INTRAMUSCULAR | Status: DC | PRN
  Filled 2013-07-23: qty 1

## 2013-07-23 MED ORDER — FUROSEMIDE 40 MG PO TABS
60.0000 mg | ORAL_TABLET | Freq: Two times a day (BID) | ORAL | Status: DC
  Filled 2013-07-23 (×3): qty 1

## 2013-07-23 MED ORDER — ADULT MULTIVITAMIN W/MINERALS CH: 1.0000 | ORAL_TABLET | Freq: Every day | ORAL | Status: DC

## 2013-07-23 MED ORDER — SODIUM CHLORIDE 0.9 % IV BOLUS (SEPSIS)
500.0000 mL | Freq: Once | INTRAVENOUS | Status: AC
  Administered 2013-07-23: 500 mL via INTRAVENOUS

## 2013-07-23 MED ORDER — PANTOPRAZOLE SODIUM 40 MG PO TBEC: 80.0000 mg | DELAYED_RELEASE_TABLET | Freq: Every day | ORAL | Status: DC

## 2013-07-23 MED ORDER — INSULIN ASPART 100 UNIT/ML ~~LOC~~ SOLN: 0.0000 [IU] | Freq: Every day | SUBCUTANEOUS | Status: DC

## 2013-07-23 MED ORDER — OXYCODONE HCL 5 MG PO TABS
5.0000 mg | ORAL_TABLET | Freq: Four times a day (QID) | ORAL | Status: DC | PRN
  Administered 2013-07-24: 5 mg via ORAL
  Filled 2013-07-23 (×2): qty 1

## 2013-07-23 MED ORDER — THIAMINE HCL 100 MG/ML IJ SOLN
100.0000 mg | Freq: Every day | INTRAMUSCULAR | Status: DC
  Filled 2013-07-23: qty 1

## 2013-07-23 MED ORDER — NADOLOL 40 MG PO TABS
40.0000 mg | ORAL_TABLET | Freq: Every morning | ORAL | Status: DC
  Administered 2013-07-24: 40 mg via ORAL
  Filled 2013-07-23: qty 1

## 2013-07-23 MED ORDER — LEVOTHYROXINE SODIUM 75 MCG PO TABS
75.0000 ug | ORAL_TABLET | Freq: Every day | ORAL | Status: DC
  Administered 2013-07-25 – 2013-08-07 (×14): 75 ug via ORAL
  Filled 2013-07-23 (×18): qty 1

## 2013-07-23 MED ORDER — HYDROMORPHONE HCL PF 1 MG/ML IJ SOLN
1.0000 mg | Freq: Once | INTRAMUSCULAR | Status: AC
  Administered 2013-07-23: 1 mg via INTRAVENOUS
  Filled 2013-07-23: qty 1

## 2013-07-23 MED ORDER — LORAZEPAM 1 MG PO TABS
0.0000 mg | ORAL_TABLET | Freq: Four times a day (QID) | ORAL | Status: DC
  Administered 2013-07-23: 1 mg via ORAL
  Filled 2013-07-23: qty 1

## 2013-07-23 MED ORDER — PAROXETINE HCL 20 MG PO TABS
20.0000 mg | ORAL_TABLET | Freq: Every day | ORAL | Status: DC
  Administered 2013-07-25 – 2013-08-07 (×14): 20 mg via ORAL
  Filled 2013-07-23 (×15): qty 1

## 2013-07-23 MED ORDER — LORAZEPAM 1 MG PO TABS: 1.0000 mg | ORAL_TABLET | Freq: Four times a day (QID) | ORAL | Status: DC | PRN

## 2013-07-23 MED ORDER — SODIUM CHLORIDE 0.9 % IV BOLUS (SEPSIS)
1000.0000 mL | Freq: Once | INTRAVENOUS | Status: DC
Start: 2013-07-23 — End: 2013-07-23

## 2013-07-23 MED ORDER — LACTULOSE 10 GM/15ML PO SOLN
40.0000 g | Freq: Three times a day (TID) | ORAL | Status: DC
  Administered 2013-07-24: 40 g via ORAL
  Filled 2013-07-23 (×9): qty 60

## 2013-07-23 MED ORDER — LORAZEPAM 2 MG/ML IJ SOLN: 1.0000 mg | Freq: Four times a day (QID) | INTRAMUSCULAR | Status: DC | PRN

## 2013-07-23 MED ORDER — INSULIN ASPART 100 UNIT/ML ~~LOC~~ SOLN: 0.0000 [IU] | Freq: Three times a day (TID) | SUBCUTANEOUS | Status: DC

## 2013-07-23 MED ORDER — FOLIC ACID 1 MG PO TABS: 1.0000 mg | ORAL_TABLET | Freq: Every day | ORAL | Status: DC

## 2013-07-23 MED ORDER — INSULIN GLARGINE 100 UNIT/ML ~~LOC~~ SOLN
12.0000 [IU] | Freq: Every day | SUBCUTANEOUS | Status: DC
  Filled 2013-07-23 (×2): qty 0.12

## 2013-07-23 MED ORDER — HYDROMORPHONE HCL PF 1 MG/ML IJ SOLN
1.0000 mg | INTRAMUSCULAR | Status: AC | PRN
  Administered 2013-07-23 (×2): 1 mg via INTRAVENOUS
  Filled 2013-07-23 (×2): qty 1

## 2013-07-23 MED ORDER — INSULIN ASPART 100 UNIT/ML ~~LOC~~ SOLN: 0.0000 [IU] | Freq: Four times a day (QID) | SUBCUTANEOUS | Status: DC

## 2013-07-23 MED ORDER — SPIRONOLACTONE 100 MG PO TABS
200.0000 mg | ORAL_TABLET | Freq: Every morning | ORAL | Status: DC
  Filled 2013-07-23: qty 2

## 2013-07-23 MED ORDER — RIFAXIMIN 550 MG PO TABS
550.0000 mg | ORAL_TABLET | Freq: Two times a day (BID) | ORAL | Status: DC
  Administered 2013-07-24 – 2013-08-07 (×29): 550 mg via ORAL
  Filled 2013-07-23 (×33): qty 1

## 2013-07-23 NOTE — ED Notes (Signed)
MD advised against patient eating at this time due to surgical consult. Patient aware.

## 2013-07-23 NOTE — Progress Notes (Signed)
Clinical Social Work Department BRIEF PSYCHOSOCIAL ASSESSMENT 07/23/2013  Patient:  Tony Boyd, Tony Boyd     Account Number:  [id number]     Admit date:  07/23/2013  Clinical Social Worker:  Luretha Rued  Date/Time:  07/23/2013 06:45 PM  Referred by:  CSW  Date Referred:  07/23/2013  Other Referral:   Interview type:  Patient Other interview type:   No family at bedside but patient reports speaking to his daughter    PSYCHOSOCIAL DATA Living Status:  ALONE Admitted from facility:   Level of care:   Primary support name:  Tony Boyd Primary support relationship to patient:  CHILD, ADULT Degree of support available:   Patient reports he has the support of his daughters    CURRENT CONCERNS  Other Concerns:    SOCIAL WORK ASSESSMENT / PLAN CSW met with patient at bedside to complete this assessment.  Patient presents alert, oriented x4, calm, cooperative.  Patient reports several falls in the past with the most recent one damaging his back. He report having home health services with [facility]. Patient reports his daughter Tony Boyd is the POA and will assist with choosing a SNF.  CSW provided the patient with SNF resources list.  Patient reports he was recently a resident of [facility] and he would not like to return to either facility.   Assessment/plan status:   Other assessment/ plan:   Information/referral to community resources:   SNF    PATIENT'S/FAMILY'S RESPONSE TO PLAN OF CARE: Patient expressed his appreciation with the support provided by the social work department.     Chesley Noon, MSW, [place], 07/23/2013 Evening Clinical Social Worker [phone]

## 2013-07-23 NOTE — ED Provider Notes (Signed)
Patient examined. Reviewed the patient with PA, Jerline Pain.  Patient with cirrhosis. Alcoholism. He currently was still with persistent serum level of alcohol. Hip fracture. Pneumonia versus pulmonary contusion. Thrombocytopenia. Care discussed with orthopedics. Plan will be admission. Medical stabilization. Repair per Ortho.  Tanna Furry, MD 07/23/13 [phone]

## 2013-07-23 NOTE — ED Notes (Signed)
Called floor for report. RN will call back promptly.

## 2013-07-23 NOTE — Progress Notes (Signed)
  CARE MANAGEMENT ED NOTE 07/23/2013  Patient:  Tony Boyd, Tony Boyd   Account Number:  [id number]  Date Initiated:  07/23/2013  Documentation initiated by:  Livia Snellen  Subjective/Objective Assessment:   Patient presents to ED post fall with injury to left hip     Subjective/Objective Assessment Detail:   Patient with pmhx of cirrhosis PT 23.9 INR 2.22     Action/Plan:   Patient to be transferred to [facility], ortho consult   Action/Plan Detail:   Anticipated DC Date:       Status Recommendation to Physician:   Result of Recommendation:    Other ED Services  Consult Working [place]  CM consult  Other    Choice offered to / List presented to:  [name]          Status of service:  Completed, signed off  ED Comments:   ED Comments Detail:  EDCM spoke to patient at bedside.  Patient reports he lives at home by himself.  Patient reports, "i have a lot of friends in the neighborhood and two daughters who live two miles away.  Patient states his daughter Tony Boyd is his POA.  Tony Boyd's phone number [phone].  Patient has two rolling walkers, bedside commode, and a shower chair at home. Patient reports he just finished services with PT with [facility] yesterday.  Up unti today patient was able to perform his ADL's on his own as per patient. Patient reports his wife has passed away.  EDCM provided patient with a list of home health agencies in [facility].  EDCM explained to patient with home health he may receive a visiting RN, PT, OT, aide and social worker if needed.  Patient thankful for resources.  No further EDCM needs at this time.
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
## 2013-07-23 NOTE — ED Notes (Signed)
Per ems pt from home. Fell at 0100, laid on floor until daughter found him at 34 to take him to doctors appointment. pcp has been prescribing pt laxatives. Pt had stool all over his house and body. 10/10 lower back pain. Alert and oriented x4.

## 2013-07-23 NOTE — ED Notes (Signed)
Bed: [id number] Expected date:  Expected time:  Means of arrival:  Comments: ems
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
## 2013-07-24 ENCOUNTER — Encounter (HOSPITAL_COMMUNITY): Payer: Self-pay | Admitting: Anesthesiology

## 2013-07-24 ENCOUNTER — Inpatient Hospital Stay (HOSPITAL_COMMUNITY): Payer: Medicare Other

## 2013-07-24 ENCOUNTER — Encounter (HOSPITAL_COMMUNITY): Payer: Medicare Other | Admitting: Anesthesiology

## 2013-07-24 ENCOUNTER — Inpatient Hospital Stay (HOSPITAL_COMMUNITY): Payer: Medicare Other | Admitting: Anesthesiology

## 2013-07-24 ENCOUNTER — Encounter (HOSPITAL_COMMUNITY)
Admission: EM | Disposition: A | Discharge status: Skilled Nursing Facility | Payer: Self-pay | Source: Home / Self Care | Attending: Pulmonary Disease

## 2013-07-24 DIAGNOSIS — W19XXXA Unspecified fall, initial encounter: Secondary | ICD-10-CM

## 2013-07-24 DIAGNOSIS — E118 Type 2 diabetes mellitus with unspecified complications: Secondary | ICD-10-CM

## 2013-07-24 DIAGNOSIS — E1165 Type 2 diabetes mellitus with hyperglycemia: Secondary | ICD-10-CM

## 2013-07-24 DIAGNOSIS — K7460 Unspecified cirrhosis of liver: Secondary | ICD-10-CM

## 2013-07-24 DIAGNOSIS — G9340 Encephalopathy, unspecified: Secondary | ICD-10-CM | POA: Diagnosis present

## 2013-07-24 DIAGNOSIS — S27329A Contusion of lung, unspecified, initial encounter: Secondary | ICD-10-CM

## 2013-07-24 DIAGNOSIS — I959 Hypotension, unspecified: Secondary | ICD-10-CM | POA: Diagnosis present

## 2013-07-24 DIAGNOSIS — K769 Liver disease, unspecified: Secondary | ICD-10-CM

## 2013-07-24 DIAGNOSIS — IMO0002 Reserved for concepts with insufficient information to code with codable children: Secondary | ICD-10-CM

## 2013-07-24 DIAGNOSIS — J9690 Respiratory failure, unspecified, unspecified whether with hypoxia or hypercapnia: Secondary | ICD-10-CM | POA: Diagnosis not present

## 2013-07-24 DIAGNOSIS — J9600 Acute respiratory failure, unspecified whether with hypoxia or hypercapnia: Secondary | ICD-10-CM

## 2013-07-24 HISTORY — PX: INTRAMEDULLARY (IM) NAIL INTERTROCHANTERIC: SHX5875

## 2013-07-24 LAB — BASIC METABOLIC PANEL
BUN: 12 mg/dL (ref 6–23)
BUN: 14 mg/dL (ref 6–23)
CALCIUM: 7.6 mg/dL — AB (ref 8.4–10.5)
CHLORIDE: 100 meq/L (ref 96–112)
CO2: 30 mEq/L (ref 19–32)
CO2: 31 mEq/L (ref 19–32)
CREATININE: 0.56 mg/dL (ref 0.50–1.35)
Calcium: 7.6 mg/dL — ABNORMAL LOW (ref 8.4–10.5)
Chloride: 98 mEq/L (ref 96–112)
Creatinine, Ser: 0.5 mg/dL (ref 0.50–1.35)
GFR calc non Af Amer: >90 mL/min (ref >90)
GFR calc non Af Amer: >90 mL/min (ref >90)
Glucose, Bld: 134 mg/dL — ABNORMAL HIGH (ref 70–99)
Glucose, Bld: 127 mg/dL — ABNORMAL HIGH (ref 70–99)
POTASSIUM: 3.7 meq/L (ref 3.7–5.3)
Potassium: 3.9 mEq/L (ref 3.7–5.3)
Sodium: 135 mEq/L — ABNORMAL LOW (ref 137–147)
Sodium: 138 mEq/L (ref 137–147)

## 2013-07-24 LAB — CBC
HCT: 25.6 % — ABNORMAL LOW (ref 39.0–52.0)
HEMATOCRIT: 23.8 % — AB (ref 39.0–52.0)
HEMOGLOBIN: 8.7 g/dL — AB (ref 13.0–17.0)
Hemoglobin: 7.9 g/dL — ABNORMAL LOW (ref 13.0–17.0)
MCH: 35.8 pg — ABNORMAL HIGH (ref 26.0–34.0)
MCH: 36.1 pg — ABNORMAL HIGH (ref 26.0–34.0)
MCHC: 34 g/dL (ref 30.0–36.0)
MCHC: 33.2 g/dL (ref 30.0–36.0)
MCV: 105.3 fL — ABNORMAL HIGH (ref 78.0–100.0)
MCV: 108.7 fL — AB (ref 78.0–100.0)
PLATELETS: 35 10*3/uL — AB (ref 150–400)
Platelets: 36 10*3/uL — ABNORMAL LOW (ref 150–400)
RBC: 2.43 MIL/uL — AB (ref 4.22–5.81)
RBC: 2.19 MIL/uL — AB (ref 4.22–5.81)
RDW: 17 % — ABNORMAL HIGH (ref 11.5–15.5)
RDW: 17.4 % — ABNORMAL HIGH (ref 11.5–15.5)
WBC: 7.1 10*3/uL (ref 4.0–10.5)
WBC: 6.5 10*3/uL (ref 4.0–10.5)

## 2013-07-24 LAB — POCT I-STAT 7, (LYTES, BLD GAS, ICA,H+H)
Acid-Base Excess: 10 mmol/L — ABNORMAL HIGH (ref 0.0–2.0)
BICARBONATE: 34.3 meq/L — AB (ref 20.0–24.0)
CALCIUM ION: 1.06 mmol/L — AB (ref 1.12–1.23)
HCT: 16 % — ABNORMAL LOW (ref 39.0–52.0)
HEMOGLOBIN: 5.4 g/dL — AB (ref 13.0–17.0)
O2 Saturation: 99 %
Potassium: 3.8 mEq/L (ref 3.7–5.3)
Sodium: 140 mEq/L (ref 137–147)
TCO2: 36 mmol/L (ref 0–100)
pCO2 arterial: 46.6 mmHg — ABNORMAL HIGH (ref 35.0–45.0)
pH, Arterial: 7.472 — ABNORMAL HIGH (ref 7.350–7.450)
pO2, Arterial: 120 mmHg — ABNORMAL HIGH (ref 80.0–100.0)

## 2013-07-24 LAB — GLUCOSE, CAPILLARY
GLUCOSE-CAPILLARY: 121 mg/dL — AB (ref 70–99)
GLUCOSE-CAPILLARY: 134 mg/dL — AB (ref 70–99)
Glucose-Capillary: 113 mg/dL — ABNORMAL HIGH (ref 70–99)
Glucose-Capillary: 132 mg/dL — ABNORMAL HIGH (ref 70–99)

## 2013-07-24 LAB — BLOOD GAS, ARTERIAL
Acid-Base Excess: 8.1 mmol/L — ABNORMAL HIGH (ref 0.0–2.0)
BICARBONATE: 32.6 meq/L — AB (ref 20.0–24.0)
DRAWN BY: 296031
FIO2: 0.5 %
LHR: 14 {breaths}/min
PATIENT TEMPERATURE: 98.6
PEEP: 5 cmH2O
PH ART: 7.438 (ref 7.350–7.450)
TCO2: 34.1 mmol/L (ref 0–100)
VT: 580 mL
pCO2 arterial: 49 mmHg — ABNORMAL HIGH (ref 35.0–45.0)
pO2, Arterial: 135 mmHg — ABNORMAL HIGH (ref 80.0–100.0)

## 2013-07-24 LAB — PROTIME-INR
INR: 2.23 — ABNORMAL HIGH (ref 0.00–1.49)
INR: 2.48 — ABNORMAL HIGH (ref 0.00–1.49)
PROTHROMBIN TIME: 26 s — AB (ref 11.6–15.2)
Prothrombin Time: 24 seconds — ABNORMAL HIGH (ref 11.6–15.2)

## 2013-07-24 LAB — PREPARE RBC (CROSSMATCH)

## 2013-07-24 LAB — SURGICAL PCR SCREEN
MRSA, PCR: NEGATIVE
STAPHYLOCOCCUS AUREUS: POSITIVE — AB

## 2013-07-24 LAB — MRSA PCR SCREENING: MRSA BY PCR: NEGATIVE

## 2013-07-24 LAB — ABO/RH: ABO/RH(D): A POS

## 2013-07-24 SURGERY — FIXATION, FRACTURE, INTERTROCHANTERIC, WITH INTRAMEDULLARY ROD
Anesthesia: General | Site: Hip | Laterality: Left

## 2013-07-24 MED ORDER — PROPOFOL 10 MG/ML IV BOLUS
INTRAVENOUS | Status: AC
  Filled 2013-07-24: qty 20

## 2013-07-24 MED ORDER — PROPOFOL 10 MG/ML IV BOLUS
INTRAVENOUS | Status: DC | PRN
  Administered 2013-07-24: 200 mg via INTRAVENOUS

## 2013-07-24 MED ORDER — FENTANYL CITRATE 0.05 MG/ML IJ SOLN
INTRAMUSCULAR | Status: DC | PRN
  Administered 2013-07-24 (×3): 50 ug via INTRAVENOUS

## 2013-07-24 MED ORDER — FERROUS SULFATE 300 (60 FE) MG/5ML PO SYRP
300.0000 mg | ORAL_SOLUTION | Freq: Every day | ORAL | Status: DC
  Administered 2013-07-25 – 2013-07-29 (×5): 300 mg via ORAL
  Filled 2013-07-24 (×7): qty 5

## 2013-07-24 MED ORDER — POLYETHYLENE GLYCOL 3350 17 G PO PACK
17.0000 g | PACK | Freq: Every day | ORAL | Status: DC | PRN
  Filled 2013-07-24: qty 1

## 2013-07-24 MED ORDER — METHOCARBAMOL 1000 MG/10ML IJ SOLN
500.0000 mg | Freq: Four times a day (QID) | INTRAVENOUS | Status: DC | PRN
  Filled 2013-07-24: qty 5

## 2013-07-24 MED ORDER — CLINDAMYCIN PHOSPHATE 900 MG/50ML IV SOLN
INTRAVENOUS | Status: DC | PRN
  Administered 2013-07-24: 900 mg via INTRAVENOUS

## 2013-07-24 MED ORDER — PHENYLEPHRINE HCL 10 MG/ML IJ SOLN
10.0000 mg | INTRAVENOUS | Status: DC | PRN
  Administered 2013-07-24: 60 ug/min via INTRAVENOUS

## 2013-07-24 MED ORDER — PHENOL 1.4 % MT LIQD: 1.0000 | OROMUCOSAL | Status: DC | PRN

## 2013-07-24 MED ORDER — FENTANYL CITRATE 0.05 MG/ML IJ SOLN
100.0000 ug | INTRAMUSCULAR | Status: DC | PRN
  Administered 2013-07-24 – 2013-07-27 (×17): 100 ug via INTRAVENOUS
  Filled 2013-07-24 (×18): qty 2

## 2013-07-24 MED ORDER — ONDANSETRON HCL 4 MG/2ML IJ SOLN: 4.0000 mg | Freq: Four times a day (QID) | INTRAMUSCULAR | Status: DC | PRN

## 2013-07-24 MED ORDER — LACTATED RINGERS IV SOLN
INTRAVENOUS | Status: DC | PRN
  Administered 2013-07-24: 14:00:00 via INTRAVENOUS

## 2013-07-24 MED ORDER — MIDAZOLAM HCL 2 MG/2ML IJ SOLN
2.0000 mg | INTRAMUSCULAR | Status: DC | PRN
  Administered 2013-07-24 – 2013-07-25 (×2): 2 mg via INTRAVENOUS
  Filled 2013-07-24 (×2): qty 2

## 2013-07-24 MED ORDER — ROCURONIUM BROMIDE 50 MG/5ML IV SOLN
INTRAVENOUS | Status: AC
  Filled 2013-07-24: qty 2

## 2013-07-24 MED ORDER — FENTANYL CITRATE 0.05 MG/ML IJ SOLN
INTRAMUSCULAR | Status: AC
  Filled 2013-07-24: qty 5

## 2013-07-24 MED ORDER — LIDOCAINE HCL (CARDIAC) 20 MG/ML IV SOLN
INTRAVENOUS | Status: DC | PRN
  Administered 2013-07-24: 80 mg via INTRAVENOUS

## 2013-07-24 MED ORDER — BIOTENE DRY MOUTH MT LIQD
15.0000 mL | Freq: Four times a day (QID) | OROMUCOSAL | Status: DC
  Administered 2013-07-25 (×4): 15 mL via OROMUCOSAL

## 2013-07-24 MED ORDER — SORBITOL 70 % SOLN
30.0000 mL | Freq: Every day | Status: DC | PRN
  Filled 2013-07-24: qty 30

## 2013-07-24 MED ORDER — MORPHINE SULFATE 2 MG/ML IJ SOLN: 0.5000 mg | INTRAMUSCULAR | Status: DC | PRN

## 2013-07-24 MED ORDER — SUCCINYLCHOLINE CHLORIDE 20 MG/ML IJ SOLN
INTRAMUSCULAR | Status: AC
  Filled 2013-07-24: qty 1

## 2013-07-24 MED ORDER — LACTATED RINGERS IV SOLN
INTRAVENOUS | Status: DC | PRN
  Administered 2013-07-24: 12:00:00 via INTRAVENOUS

## 2013-07-24 MED ORDER — CLINDAMYCIN PHOSPHATE 600 MG/50ML IV SOLN
600.0000 mg | Freq: Once | INTRAVENOUS | Status: AC
  Administered 2013-07-24: 600 mg via INTRAVENOUS
  Filled 2013-07-24: qty 50

## 2013-07-24 MED ORDER — LACTATED RINGERS IV SOLN
INTRAVENOUS | Status: DC | PRN
  Administered 2013-07-24: 13:00:00 via INTRAVENOUS

## 2013-07-24 MED ORDER — LIDOCAINE HCL (CARDIAC) 20 MG/ML IV SOLN
INTRAVENOUS | Status: AC
  Filled 2013-07-24: qty 5

## 2013-07-24 MED ORDER — HYDROMORPHONE HCL PF 1 MG/ML IJ SOLN: 0.2500 mg | INTRAMUSCULAR | Status: DC | PRN

## 2013-07-24 MED ORDER — SENNA 8.6 MG PO TABS
1.0000 | ORAL_TABLET | Freq: Two times a day (BID) | ORAL | Status: DC
  Filled 2013-07-24 (×3): qty 1

## 2013-07-24 MED ORDER — LEVETIRACETAM 500 MG/5ML IV SOLN
1000.0000 mg | Freq: Two times a day (BID) | INTRAVENOUS | Status: DC
  Administered 2013-07-24 – 2013-07-25 (×2): 1000 mg via INTRAVENOUS
  Filled 2013-07-24 (×3): qty 10

## 2013-07-24 MED ORDER — MAGNESIUM CITRATE PO SOLN
1.0000 | Freq: Once | ORAL | Status: AC | PRN
  Filled 2013-07-24: qty 296

## 2013-07-24 MED ORDER — METHOCARBAMOL 500 MG PO TABS
500.0000 mg | ORAL_TABLET | Freq: Four times a day (QID) | ORAL | Status: DC | PRN
  Filled 2013-07-24: qty 1

## 2013-07-24 MED ORDER — ROCURONIUM BROMIDE 100 MG/10ML IV SOLN
INTRAVENOUS | Status: DC | PRN
  Administered 2013-07-24: 50 mg via INTRAVENOUS
  Administered 2013-07-24: 20 mg via INTRAVENOUS

## 2013-07-24 MED ORDER — OXYCODONE HCL 5 MG PO TABS: 5.0000 mg | ORAL_TABLET | ORAL | Status: DC | PRN

## 2013-07-24 MED ORDER — 0.9 % SODIUM CHLORIDE (POUR BTL) OPTIME
TOPICAL | Status: DC | PRN
  Administered 2013-07-24: 1000 mL

## 2013-07-24 MED ORDER — METOCLOPRAMIDE HCL 5 MG PO TABS
5.0000 mg | ORAL_TABLET | Freq: Three times a day (TID) | ORAL | Status: DC | PRN
  Filled 2013-07-24: qty 2

## 2013-07-24 MED ORDER — CHLORHEXIDINE GLUCONATE 0.12 % MT SOLN
15.0000 mL | Freq: Two times a day (BID) | OROMUCOSAL | Status: DC
  Administered 2013-07-24 – 2013-07-25 (×3): 15 mL via OROMUCOSAL
  Filled 2013-07-24 (×3): qty 15

## 2013-07-24 MED ORDER — SODIUM CHLORIDE 0.9 % IV SOLN
INTRAVENOUS | Status: DC
  Administered 2013-07-24: 17:00:00 via INTRAVENOUS

## 2013-07-24 MED ORDER — ARTIFICIAL TEARS OP OINT
TOPICAL_OINTMENT | OPHTHALMIC | Status: AC
  Filled 2013-07-24: qty 3.5

## 2013-07-24 MED ORDER — CLINDAMYCIN PHOSPHATE 600 MG/50ML IV SOLN
600.0000 mg | Freq: Four times a day (QID) | INTRAVENOUS | Status: AC
  Administered 2013-07-24 – 2013-07-25 (×2): 600 mg via INTRAVENOUS
  Filled 2013-07-24 (×2): qty 50

## 2013-07-24 MED ORDER — INSULIN ASPART 100 UNIT/ML ~~LOC~~ SOLN
0.0000 [IU] | SUBCUTANEOUS | Status: DC
  Administered 2013-07-24 – 2013-07-25 (×3): 2 [IU] via SUBCUTANEOUS
  Administered 2013-07-25: 3 [IU] via SUBCUTANEOUS

## 2013-07-24 MED ORDER — METOCLOPRAMIDE HCL 5 MG/ML IJ SOLN
5.0000 mg | Freq: Three times a day (TID) | INTRAMUSCULAR | Status: DC | PRN
  Filled 2013-07-24: qty 2

## 2013-07-24 MED ORDER — MIDAZOLAM HCL 2 MG/2ML IJ SOLN
INTRAMUSCULAR | Status: AC
  Filled 2013-07-24: qty 2

## 2013-07-24 MED ORDER — PROMETHAZINE HCL 25 MG/ML IJ SOLN: 6.2500 mg | INTRAMUSCULAR | Status: DC | PRN

## 2013-07-24 MED ORDER — ALBUMIN HUMAN 5 % IV SOLN
INTRAVENOUS | Status: DC | PRN
  Administered 2013-07-24 (×2): via INTRAVENOUS

## 2013-07-24 MED ORDER — ALUM & MAG HYDROXIDE-SIMETH 200-200-20 MG/5ML PO SUSP: 30.0000 mL | ORAL | Status: DC | PRN

## 2013-07-24 MED ORDER — ONDANSETRON HCL 4 MG/2ML IJ SOLN
INTRAMUSCULAR | Status: AC
  Filled 2013-07-24: qty 2

## 2013-07-24 MED ORDER — RIVAROXABAN 10 MG PO TABS: 10.0000 mg | ORAL_TABLET | Freq: Every day | ORAL | Status: DC

## 2013-07-24 MED ORDER — THIAMINE HCL 100 MG/ML IJ SOLN
100.0000 mg | Freq: Every day | INTRAMUSCULAR | Status: DC
  Administered 2013-07-24 – 2013-07-25 (×2): 100 mg via INTRAVENOUS
  Filled 2013-07-24 (×2): qty 1

## 2013-07-24 MED ORDER — SUCCINYLCHOLINE CHLORIDE 20 MG/ML IJ SOLN
INTRAMUSCULAR | Status: DC | PRN
  Administered 2013-07-24: 120 mg via INTRAVENOUS

## 2013-07-24 MED ORDER — ONDANSETRON HCL 4 MG PO TABS: 4.0000 mg | ORAL_TABLET | Freq: Four times a day (QID) | ORAL | Status: DC | PRN

## 2013-07-24 MED ORDER — FOLIC ACID 5 MG/ML IJ SOLN
1.0000 mg | Freq: Every day | INTRAMUSCULAR | Status: DC
  Administered 2013-07-24 – 2013-07-25 (×2): 1 mg via INTRAVENOUS
  Filled 2013-07-24 (×2): qty 0.2

## 2013-07-24 MED ORDER — MIDAZOLAM HCL 5 MG/5ML IJ SOLN
INTRAMUSCULAR | Status: DC | PRN
  Administered 2013-07-24 (×2): 1 mg via INTRAVENOUS

## 2013-07-24 MED ORDER — MENTHOL 3 MG MT LOZG: 1.0000 | LOZENGE | OROMUCOSAL | Status: DC | PRN

## 2013-07-24 SURGICAL SUPPLY — 55 items
BANDAGE GAUZE ELAST BULKY 4 IN (GAUZE/BANDAGES/DRESSINGS) ×3 IMPLANT
BLADE SURG 15 STRL LF DISP TIS (BLADE) ×1 IMPLANT
BLADE SURG 15 STRL SS (BLADE) ×2
BNDG COHESIVE 4X5 TAN NS LF (GAUZE/BANDAGES/DRESSINGS) ×3 IMPLANT
COVER PERINEAL POST (MISCELLANEOUS) ×3 IMPLANT
COVER SURGICAL LIGHT HANDLE (MISCELLANEOUS) ×3 IMPLANT
DRAPE INCISE IOBAN 66X45 STRL (DRAPES) ×3 IMPLANT
DRAPE PROXIMA HALF (DRAPES) IMPLANT
DRAPE STERI IOBAN 125X83 (DRAPES) ×3 IMPLANT
DRSG MEPILEX BORDER 4X4 (GAUZE/BANDAGES/DRESSINGS) IMPLANT
DRSG MEPILEX BORDER 4X8 (GAUZE/BANDAGES/DRESSINGS) ×3 IMPLANT
DRSG PAD ABDOMINAL 8X10 ST (GAUZE/BANDAGES/DRESSINGS) ×6 IMPLANT
DURAPREP 26ML APPLICATOR (WOUND CARE) ×6 IMPLANT
ELECT CAUTERY BLADE 6.4 (BLADE) ×3 IMPLANT
ELECT REM PT RETURN 9FT ADLT (ELECTROSURGICAL) ×3
ELECTRODE REM PT RTRN 9FT ADLT (ELECTROSURGICAL) ×1 IMPLANT
FACESHIELD WRAPAROUND (MASK) ×3 IMPLANT
GAUZE XEROFORM 5X9 LF (GAUZE/BANDAGES/DRESSINGS) ×3 IMPLANT
GLOVE BIO SURGEON STRL SZ7 (GLOVE) ×6 IMPLANT
GLOVE BIOGEL PI IND STRL 7.0 (GLOVE) ×5 IMPLANT
GLOVE BIOGEL PI INDICATOR 7.0 (GLOVE) ×10
GLOVE SURG SS PI 7.0 STRL IVOR (GLOVE) ×15 IMPLANT
GLOVE SURG SS PI 7.5 STRL IVOR (GLOVE) ×6 IMPLANT
GOWN STRL REUS W/ TWL LRG LVL3 (GOWN DISPOSABLE) ×3 IMPLANT
GOWN STRL REUS W/ TWL XL LVL3 (GOWN DISPOSABLE) ×1 IMPLANT
GOWN STRL REUS W/TWL LRG LVL3 (GOWN DISPOSABLE) ×6
GOWN STRL REUS W/TWL XL LVL3 (GOWN DISPOSABLE) ×2
GUIDE PIN 3.2 LONG (PIN) ×3 IMPLANT
GUIDE PIN 3.2MM (MISCELLANEOUS) ×2
GUIDE PIN ORTH 343X3.2XBRAD (MISCELLANEOUS) ×1 IMPLANT
HIP SCREW SET (Screw) ×3 IMPLANT
KIT BASIN OR (CUSTOM PROCEDURE TRAY) ×3 IMPLANT
KIT ROOM TURNOVER OR (KITS) ×3 IMPLANT
LINER BOOT UNIVERSAL DISP (MISCELLANEOUS) ×3 IMPLANT
MANIFOLD NEPTUNE II (INSTRUMENTS) ×3 IMPLANT
NAIL IMHS L 10X38 (Nail) ×3 IMPLANT
NS IRRIG 1000ML POUR BTL (IV SOLUTION) ×3 IMPLANT
PACK GENERAL/GYN (CUSTOM PROCEDURE TRAY) ×3 IMPLANT
PAD ARMBOARD 7.5X6 YLW CONV (MISCELLANEOUS) ×6 IMPLANT
PAD CAST 4YDX4 CTTN HI CHSV (CAST SUPPLIES) ×2 IMPLANT
PADDING CAST COTTON 4X4 STRL (CAST SUPPLIES) ×4
SCREW COMPRESSION (Screw) ×3 IMPLANT
SCREW LAG 95MM (Screw) ×2 IMPLANT
SCREW LAGSTD 95X21X12.7X9 (Screw) ×1 IMPLANT
SPONGE GAUZE 4X4 12PLY (GAUZE/BANDAGES/DRESSINGS) ×3 IMPLANT
STAPLER VISISTAT 35W (STAPLE) ×6 IMPLANT
SUT VIC AB 0 CT1 27 (SUTURE) ×4
SUT VIC AB 0 CT1 27XBRD ANBCTR (SUTURE) ×2 IMPLANT
SUT VIC AB 2-0 CT1 27 (SUTURE) ×8
SUT VIC AB 2-0 CT1 TAPERPNT 27 (SUTURE) ×4 IMPLANT
TAPE CLOTH 4X10 WHT NS (GAUZE/BANDAGES/DRESSINGS) ×3 IMPLANT
TOWEL OR 17X24 6PK STRL BLUE (TOWEL DISPOSABLE) ×6 IMPLANT
TOWEL OR 17X26 10 PK STRL BLUE (TOWEL DISPOSABLE) ×3 IMPLANT
TUBE CONNECTING 12'X1/4 (SUCTIONS) ×1
TUBE CONNECTING 12X1/4 (SUCTIONS) ×2 IMPLANT

## 2013-07-24 NOTE — Transfer of Care (Signed)
Immediate Anesthesia Transfer of Care Note  Patient: Tony Boyd  Procedure(s) Performed: Procedure(s): INTRAMEDULLARY (IM) NAIL INTERTROCHANTERIC (Left)  Patient Location: ICU  Anesthesia Type:General  Level of Consciousness: sedated  Airway & Oxygen Therapy: Patient remains intubated per anesthesia plan and Patient placed on Ventilator (see vital sign flow sheet for setting)  Post-op Assessment: Post -op Vital signs reviewed and stable and Report to ICU RN and to Intensivist  Post vital signs: Reviewed and stable  Complications: No apparent anesthesia complications

## 2013-07-24 NOTE — Progress Notes (Addendum)
ANTICOAGULATION CONSULT NOTE - Initial Consult  Pharmacy Consult for coumadin Indication: VTE prophylaxis  Allergies  Allergen Reactions  . Ativan [Lorazepam] Other (See Comments)    Makes patient very weak and cant mentate appropriately  . Tylenol [Acetaminophen] Other (See Comments)    Aggravates liver problems  . Droperidol Hives  . Penicillins Swelling  . Toradol [Ketorolac Tromethamine] Hives    Patient Measurements: Height: 5\' 9"  (175.3 cm) Weight: 269 lb 6.4 oz (122.2 kg) IBW/kg (Calculated) : 70.7 Heparin Dosing Weight:   Vital Signs: Temp: 98 F (36.7 C) (05/29 1112) Temp src: Oral (05/29 1112) BP: 137/66 mmHg (05/29 1522) Pulse Rate: 67 (05/29 1522)  Labs:  Recent Labs  07/23/13 0156 07/23/13 1535 07/23/13 1639 07/24/13 0850 07/24/13 1208  HGB 8.7* 9.9*  --  7.9*  --   HCT 25.6* 29.1*  --  23.8*  --   PLT 36* 42*  --  35*  --   LABPROT  --   --  23.9*  --  24.0*  INR  --   --  2.22*  --  2.23*  CREATININE 0.50 0.49*  --  0.56  --   CKTOTAL  --  238*  --   --   --     Estimated Creatinine Clearance: 130 ml/min (by C-G formula based on Cr of 0.56).   Medical History: Past Medical History  Diagnosis Date  . Cirrhosis of liver   . Hepatitis C   . ETOH abuse   . Hypothyroid   . Psychosis   . Bipolar disorder   . Seizures   . Arthritis     chronic back pain  . Coagulopathy onset prior to 2009  . Thrombocytopenia onsetprior to 2009  . Pancytopenia onset prior to 2009    with anemia, low platelets  . Hyponatremia onset prior to 2009  . Korsakoff psychosis   . H/O abdominal abscess 2012    abcess involving ventral hernia. drained 06/2010 by IR  . 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 Hepatic encephalopathy prior to 2009  . H/O renal failure   . Ascites 01/2013    too minor to tap on ultrasound  . Peripheral vascular disease   . GERD (gastroesophageal reflux disease)   . Hypothyroidism 06/02/2012  .  Obesity   . Cellulitis and abscess of upper arm and forearm   . Chronic kidney disease, unspecified   . Type 2 diabetes mellitus with diabetic neuropathy   . Compression fracture of L3 lumbar vertebra 01/2013  . Hernia of abdominal wall 2012    multiple ventral hernias  . Anxiety   . Depression   . Fibromyalgia     Medications:  Prescriptions prior to admission  Medication Sig Dispense Refill  . albuterol (PROVENTIL HFA;VENTOLIN HFA) 108 (90 BASE) MCG/ACT inhaler Inhale 1-2 puffs into the lungs every 6 (six) hours as needed for wheezing or shortness of breath.      . ALPRAZolam (XANAX) 1 MG tablet Take 1 tablet (1 mg total) by mouth at bedtime as needed for anxiety.  30 tablet  1  . ferrous sulfate 325 (65 FE) MG tablet Take 1 tablet (325 mg total) by mouth at bedtime.  30 tablet  0  . folic acid (FOLVITE) 1 MG tablet Take 1 tablet (1 mg total) by mouth every morning.  30 tablet  1  . furosemide (LASIX) 20 MG tablet Take 3 tablets (60 mg total) by  mouth 2 (two) times daily.  1 tablet  0  . insulin glargine (LANTUS) 100 UNIT/ML injection Inject 0.12 mLs (12 Units total) into the skin at bedtime.  10 mL  1  . lactulose (CHRONULAC) 10 GM/15ML solution Take 60 mLs (40 g total) by mouth 3 (three) times daily.  1 mL  0  . levETIRAcetam (KEPPRA) 500 MG tablet Take 2 tablets (1,000 mg total) by mouth 2 (two) times daily.  180 tablet  1  . levothyroxine (SYNTHROID, LEVOTHROID) 75 MCG tablet Take 75 mcg by mouth daily before breakfast.      . Multiple Vitamin (MULTIVITAMIN WITH MINERALS) TABS tablet Take 1 tablet by mouth every morning.  30 tablet  1  . nadolol (CORGARD) 40 MG tablet Take 1 tablet (40 mg total) by mouth every morning.  90 tablet  1  . omeprazole (PRILOSEC) 40 MG capsule Take 1 capsule (40 mg total) by mouth daily.  30 capsule  1  . ondansetron (ZOFRAN) 4 MG tablet Take 1 tablet (4 mg total) by mouth every 8 (eight) hours as needed for nausea or vomiting.  20 tablet  0  . Oxycodone  HCl 10 MG TABS 2 by mouth five times daily  300 tablet  0  . PARoxetine (PAXIL) 20 MG tablet Take 1 tablet (20 mg total) by mouth daily.  30 tablet  0  . rifaximin (XIFAXAN) 550 MG TABS tablet Take 1 tablet (550 mg total) by mouth 2 (two) times daily.  60 tablet  1  . risperiDONE (RISPERDAL) 0.25 MG tablet Take 1 tablet (0.25 mg total) by mouth 2 (two) times daily.  180 tablet  3  . spironolactone (ALDACTONE) 100 MG tablet Take 2 tablets (200 mg total) by mouth every morning.  60 tablet  1   Scheduled:  . clindamycin (CLEOCIN) IV  600 mg Intravenous Q6H  . clindamycin (CLEOCIN) IV  600 mg Intravenous Once  . ferrous sulfate  325 mg Oral QHS  . folic acid  1 mg Oral q morning - 10a  . furosemide  60 mg Oral BID  . insulin aspart  0-20 Units Subcutaneous QID  . insulin aspart  0-5 Units Subcutaneous QHS  . insulin glargine  12 Units Subcutaneous QHS  . lactulose  40 g Oral TID  . levETIRAcetam  1,000 mg Oral BID  . levothyroxine  75 mcg Oral QAC breakfast  . LORazepam  0-4 mg Oral Q6H   Followed by  . [START ON 07/26/2013] LORazepam  0-4 mg Oral Q12H  . multivitamin with minerals  1 tablet Oral q morning - 10a  . nadolol  40 mg Oral q morning - 10a  . pantoprazole  80 mg Oral Daily  . PARoxetine  20 mg Oral Daily  . rifaximin  550 mg Oral BID  . risperiDONE  0.25 mg Oral BID  . senna  1 tablet Oral BID  . spironolactone  200 mg Oral q morning - 10a  . thiamine  100 mg Oral Daily   Or  . thiamine  100 mg Intravenous Daily   Infusions:  . sodium chloride      Assessment: 58 yo who is s/p nailing of femoral fx. 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 He has some significant baseline coagulopathy. His INR has been around 2. He received some FFP today. Xarelto is contraindicated in those with coagulopathy. Coumadin is ordered for DVT px. We'll recheck INR again tonight. We'll be less aggressive.  Goal of Therapy:  INR 2-3 Monitor platelets by anticoagulation  protocol: Yes   Plan:   Recheck INR We'll dose coumadin tonight  PM Addendum:   After further discussion with CCM, we'll hold off coumadin until CCM can further discuss with ortho. Pt has liver dz and as well as low plt. Dosing coumadin would be challenge here since his baseline INR is elevated.   Onnie Boer, PharmD Pager: [phone] 07/24/2013 6:24 PM

## 2013-07-24 NOTE — Brief Op Note (Signed)
   Brief Op Note  Date of Surgery: 07/24/2013  Preoperative Diagnosis: Left Intertrochantric Nail Fracture  Postoperative Diagnosis: same  Procedure: Procedure(s): INTRAMEDULLARY (IM) NAIL INTERTROCHANTERIC  Implants: Nanetta Batty IMHS  Surgeons: Surgeon(s): Andra Matsuo Eduard Roux, MD  Anesthesia: General  Drains: none  Estimated Blood Loss: 4193 cc  Complications: None  Condition to PACU: Transferred to ICU intubated  Mosinee, MD [place] 07/24/2013 4:08 PM

## 2013-07-24 NOTE — Transfer of Care (Signed)
Immediate Anesthesia Transfer of Care Note  Patient: Tony Boyd  Procedure(s) Performed: Procedure(s): INTRAMEDULLARY (IM) NAIL INTERTROCHANTERIC (Left)  Patient Location: PACU  Anesthesia Type:General  Level of Consciousness: sedated, responds to stimulation and Patient remains intubated per anesthesia plan  Airway & Oxygen Therapy: Patient placed on Ventilator (see vital sign flow sheet for setting) and Pt moving right arm, opening eyes  Post-op Assessment: Post -op Vital signs reviewed and stable  Post vital signs: Reviewed and stable  Complications: No apparent anesthesia complications

## 2013-07-24 NOTE — Progress Notes (Signed)
Call received from lab that PT/INR drawn earlier hemolized - need to reorder and re-draw.
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
## 2013-07-24 NOTE — Progress Notes (Signed)
Patient ID: Tony Boyd, male   DOB: November 16, 1955, 58 y.o.   MRN: [id number] [facility] PROGRESS NOTE  Tony Boyd XIP:[id number] DOB: [birth year] DOA: 07/23/2013 PCP: Hollace Kinnier, DO  Brief narrative: 58 y.o. male with past medical history of hepatitis C, alcoholic liver cirrhosis, hepatic encephalopathy, seizure disorder, coagulopathy, diabetes who presented to [facility] ED 07/23/2013 status post fall at home. Patient sustained left intertrochanteric femoral fracture. Chest x-ray on admission showed possible contusion versus pneumonia. Patient did not have complaints of cough or shortness of breath. His white blood cell count was within normal limits on admission. Orthopedic surgery saw and evaluated the patient for possible surgery. He has received FFP's 4 INR of 2.2.  Assessment/Plan:  Principal problem: Left intertrochanteric femoral fracture  Appreciate orthopedic surgery consult and recommendations; plan is for surgery today as long gas platelets are above 20. Patient was given FFP's 4 INR of 2.2  Pain management with morphine 0.5 mg IV every 2 hours as needed as well as oxycodone 5 mg by mouth q. 6 hours as needed Active problems: Acute hepatic encephalopathy   Secondary to alcoholic liver cirrhosis  Continue lactulose 40 g by mouth 3 times a day  Continue rifaximin 550 mg PO BID  Ammonia level within normal limits on admission.  Acute alcohol intoxication  Alcohol level LXII on admission  CIWA protocol order in place Anasarca, Scrotal swelling   Secondary to liver cirrhosis.  Continue Lasix 60 mg by mouth twice a day and spironolactone 200 mg daily  Corrected electrolytes as needed End-stage liver disease  Patient is taking Lasix 60 mg by mouth twice a day, lactulose 40 g by mouth 3 times a day, nadolol 40 mg daily, rifaximin 550 mg by mouth twice a day and spironolactone 200 mg daily Seizure disorder   Continue Keppra 500 mg PO BID.  Thrombocytopenia, acquired    Secondary to bone marrow suppression from end-stage liver disease  Continue to monitor platelet count  Transfuse if bleeding or platelets lower than 20 Anemia of chronic disease   Secondary to bone marrow suppression from end-stage liver disease  Continue ferrous sulfate supplementation, folic acid and multivitamin  Hemoglobin is 7.9 this morning so will transfuse 1 unit of blood today Hypothyroidism   Continue synthroid.  Diabetes mellitus   Continue Lantus 12 units at bedtime and sliding scale insulin  CBGs in past 24 hours: 134, 134, 121  Hemoglobin in April 2015 was 5.6 indicating good glycemic control Moderate protein-calorie malnutrition   Nutrition consulted   DVT prophylaxis: not on any due to planned surgery today. Will address immediately after the surgery   Code Status: full code  Family Communication: patient is sleeping this am, family not at the bedside this am Disposition Plan: home when stable   Robbie Lis, MD  [facility] Pager [phone]  If 7PM-7AM, please contact night-coverage [email or web address] Password [email or web address] 07/24/2013, 8:33 AM   LOS: 1 day   Consultants:  Orthopedic surgery   Procedures:  None so far but plan for surgery today   Antibiotics:  None   HPI/Subjective: No acute overnight events.  Objective: Filed Vitals:   07/23/13 2255 07/23/13 2300 07/24/13 0207 07/24/13 0415  BP: 126/61  105/49 106/62  Pulse: 87  91 91  Temp: 97.9 F (36.6 C)  98 F (36.7 C) 98 F (36.7 C)  TempSrc: Oral  Axillary Axillary  Resp: 16  16 16   Height:  5\' 8"  (1.727 m)    Weight:  122.2 kg (269  lb 6.4 oz)    SpO2: 96%  93% 100%    Intake/Output Summary (Last 24 hours) at 07/24/13 0833 Last data filed at 07/24/13 0415  Gross per 24 hour  Intake    530 ml  Output    500 ml  Net     30 ml    Exam:   General:  Pt is sleeping this am, not in acute distress  Cardiovascular: Regular rate and rhythm, S1/S2, no  murmurs  Respiratory: Clear to auscultation bilaterally, no wheezing, no crackles, no rhonchi  Abdomen: Soft, non tender, non distended, bowel sounds present  Extremities: +3  LE pitting edema with chronic kin changes, venous stasis, pulses DP and PT palpable bilaterally  Neuro: Grossly nonfocal  Data Reviewed: Basic Metabolic Panel:  Recent Labs Lab 07/23/13 0156 07/23/13 1535  NA 135* 138  K 3.7 3.9  CL 98 98  CO2 30 31  GLUCOSE 134* 151*  BUN 12 9  CREATININE 0.50 0.49*  CALCIUM 7.6* 7.6*   Liver Function Tests:  Recent Labs Lab 07/23/13 1535  AST 43*  ALT 16  ALKPHOS 168*  BILITOT 5.5*  PROT 5.8*  ALBUMIN 1.9*   No results found for this basename: LIPASE, AMYLASE,  in the last 168 hours  Recent Labs Lab 07/23/13 1639  AMMONIA 31   CBC:  Recent Labs Lab 07/23/13 0156 07/23/13 1535  WBC 7.1 8.2  NEUTROABS  --  6.9  HGB 8.7* 9.9*  HCT 25.6* 29.1*  MCV 105.3* 105.1*  PLT 36* 42*   Cardiac Enzymes:  Recent Labs Lab 07/23/13 1535  CKTOTAL 238*   BNP: No components found with this basename: POCBNP,  CBG:  Recent Labs Lab 07/23/13 1543 07/23/13 1939 07/23/13 2121 07/24/13 0003 07/24/13 0414  GLUCAP 156* 136* 134* 134* 121*    No results found for this or any previous visit (from the past 240 hour(s)).   Studies: Dg Chest 1 View  07/23/2013   IMPRESSION: Left mid lung airspace opacity, consistent pulmonary contusion or other pneumonia. Recommend chest radiographic followup in several weeks to confirm resolution.    Dg Lumbar Spine 2-3 View  07/23/2013 IMPRESSION: 1. The minimally displaced left-sided intertrochanteric femur fracture is not well demonstrated on the present examination. Please refer to dedicated pelvic and left femur radiographs performed earlier same day. 2. Interval development of a moderate (approximately 40%) compression deformity involving the L2 vertebral body - correlation for point tenderness at this location is  recommended. 3. Unchanged mild (< 25%) compression deformity involving the superior endplate of the L4 vertebral body. 4. Unchanged mild multilevel lumbar spine DDD, worse at L3-L4.     Dg Pelvis 1-2 Views 07/23/2013   IMPRESSION: Left femoral intertrochanteric fracture.     Dg Femur Left 07/23/2013    IMPRESSION: Mildly displaced and impacted left femoral intertrochanteric fracture.     Ct Head Wo Contrast  07/23/2013     IMPRESSION: No acute intracranial pathology.    Dg Knee Complete 4 Views Left  07/23/2013   IMPRESSION: 1. No acute fracture, malalignment or joint effusion. 2. Mild patellofemoral osteoarthritis. 3. Diffuse soft tissue edema versus cellulitis.     Scheduled Meds: . ferrous sulfate  325 mg Oral QHS  . folic acid  1 mg Oral q morning - 10a  . furosemide  60 mg Oral BID  . insulin aspart  0-20 Units Subcutaneous QID  . insulin aspart  0-5 Units Subcutaneous QHS  . insulin glargine  12 Units Subcutaneous  QHS  . lactulose  40 g Oral TID  . levETIRAcetam  1,000 mg Oral BID  . levothyroxine  75 mcg Oral QAC breakfast  . LORazepam  0-4 mg Oral Q6H   Followed by  . [START ON 07/26/2013] LORazepam  0-4 mg Oral Q12H  . multivitamin with minerals  1 tablet Oral q morning - 10a  . nadolol  40 mg Oral q morning - 10a  . pantoprazole  80 mg Oral Daily  . PARoxetine  20 mg Oral Daily  . rifaximin  550 mg Oral BID  . risperiDONE  0.25 mg Oral BID  . spironolactone  200 mg Oral q morning - 10a  . thiamine  100 mg Oral Daily   Or  . thiamine  100 mg Intravenous Daily
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
## 2013-07-24 NOTE — Op Note (Signed)
Date of Surgery: 07/24/2013  INDICATIONS: Tony Boyd is a 58 y.o.-year-old male who was involved in a mechanical fall and sustained a left hip fracture (intertrochanteric-type). The risks including infection, need for further surgery, death, heart attack and benefits of the procedure discussed with the patient prior to the procedure and all questions were answered; consent was obtained.  Patient understands he is high risk for surgery given his baseline cirrhosis, anasarca, and overall poor protoplasm.  PREOPERATIVE DIAGNOSIS: left hip fracture (intertrochanteric-type)   POSTOPERATIVE DIAGNOSIS: Same   PROCEDURE: Treatment of intertrochanteric fracture with intramedullary implant. CPT [phone]   SURGEON: N. Eduard Roux, M.D.   ANESTHESIA: general   IV FLUIDS AND URINE: See anesthesia record   ESTIMATED BLOOD LOSS: 2000 cc  IMPLANTS: [employer]  DRAINS: None.   COMPLICATIONS: None.   DESCRIPTION OF PROCEDURE: The patient was brought to the operating room and placed supine on the operating table. The patient's leg had been signed prior to the procedure. The patient had the anesthesia placed by the anesthesiologist. The prep verification and incision time-outs were performed to confirm that this was the correct patient, site, side and location. The patient had an SCD on the opposite lower extremity. The patient did receive antibiotics prior to the incision and was re-dosed during the procedure as needed at indicated intervals. The patient was positioned on the fracture table with the table in traction and external rotation to reduce the fracture. The well leg was placed in a hemi-lithotomy position and all bony prominences were well-padded. The patient had the lower extremity prepped and draped in the standard surgical fashion. The incision was made 4 finger breadths superior to the greater trochanter. A guide pin was inserted into the tip of the greater trochanter under fluoroscopic  guidance. An opening reamer was used to gain access to the femoral canal. The nail length was measured and inserted down the femoral canal to its proper depth. The appropriate version of insertion for the lag screw was found under fluoroscopy. A bone hook was used to pull the proximal fracture fragment out of valgus.  A pin was inserted up the femoral neck through the jig. Then, a second antirotation pin was inserted inferior to the first pin. The length of the lag screw was then measured. Then the lag screw was inserted as near to center-center in the head as possible. The antirotation pin was then taken out. The leg was taken out of traction, then the lag screw was used to compress across the fracture. Compression was visualized on serial xrays. The wound was copiously irrigated with saline and the subcutaneous layer closed with 2.0 vicryl and the skin was reapproximated with staples. The wounds were cleaned and dried a final time and a sterile dressing was placed. The hip was taken through a range of motion at the end of the case under fluoroscopic imaging to visualize the approach-withdraw phenomenon and confirm implant length in the head. It was determined that the patient was best left intubated and monitored in the ICU overnight because of his multiple comorbidities and blood loss and anemia.  The patient remained stable throughout the procedure.  All counts were correct at the end of the case.   POSTOPERATIVE PLAN: The patient will be weight bearing as tolerated.  He will be placed on coumadin for DVT prophylaxis.  Due to his cirrhosis and thrombocytopenia, lovenox, xarelto, aspirin, and subq heparin are contraindicated.  Azucena Cecil, MD [place] 5:42 PM

## 2013-07-24 NOTE — Progress Notes (Signed)
Patient rested well during night.  NPO except sips of water with meds.  Patient has not voided since transfer from [facility] ED last pm at 2300.  Unsuccessful attempt with urinal this am.  Bladder scan performed revealing 236mL in bladder.  Patient denies bladder pain or discomfort.  Patient has generalized edema with increased edema to left thigh hip area.  Scrotal edema present.  Continue to monitor for spontaneous void.

## 2013-07-24 NOTE — Anesthesia Procedure Notes (Addendum)
Procedure Name: Intubation Date/Time: 07/24/2013 12:46 PM Performed by: Terrill Mohr Pre-anesthesia Checklist: Patient identified, Emergency Drugs available, Suction available and Patient being monitored Patient Re-evaluated:Patient Re-evaluated prior to inductionOxygen Delivery Method: Circle system utilized Preoxygenation: Pre-oxygenation with 100% oxygen Intubation Type: IV induction and Rapid sequence Laryngoscope Size: Mac and 4 (video Glide) Grade View: Grade II Tube type: Subglottic suction tube Tube size: 8.0 mm Number of attempts: 1 Airway Equipment and Method: Video-laryngoscopy and Stylet Placement Confirmation: ETT inserted through vocal cords under direct vision,  breath sounds checked- equal and bilateral and positive ETCO2 Secured at: 21 (cm at teeth) cm Tube secured with: Tape Dental Injury: Teeth and Oropharynx as per pre-operative assessment       The patient was identified and consent obtained.  TO was performed, and full barrier precautions were used.  The skin was anesthetized with lidocaine.  Once the vein was located with the 22 ga., the wire was inserted into the vein. The insertion site was dilated and the CVL was carefully inserted and sutured in place. The patient tolerated the procedure well.  CXR was ordered for PACU.Ultrasound guidance: relevant anatomy identified, needle position confirmed, vessel patent, wire seen inside the vessel.  Images printed for the medical record.  Start: 1219 End: 1228 J. Tedra Senegal, MD

## 2013-07-24 NOTE — Anesthesia Postprocedure Evaluation (Signed)
  Anesthesia Post-op Note  Patient: Tony Boyd  Procedure(s) Performed: Procedure(s): INTRAMEDULLARY (IM) NAIL INTERTROCHANTERIC (Left)   Patient Location:  105M-08   Anesthesia Type:General  Level of Consciousness: sedated and Patient remains intubated per anesthesia plan  Airway and Oxygen Therapy: Patient placed on Ventilator (see vital sign flow sheet for setting)  Post-op Pain: none  Post-op Assessment: Post-op Vital signs reviewed, Patient's Cardiovascular Status Stable, Respiratory Function Stable, Patent Airway and No signs of Nausea or vomiting  Post-op Vital Signs: Reviewed and stable  Last Vitals:  Filed Vitals:   07/24/13 1522  BP: 137/66  Pulse: 67  Temp:   Resp: 16    Complications: No apparent anesthesia complications
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
## 2013-07-24 NOTE — Anesthesia Preprocedure Evaluation (Addendum)
Anesthesia Evaluation  Patient identified by MRN, date of birth, ID band Patient awake    Reviewed: Allergy & Precautions, H&P , NPO status , Patient's Chart, lab work & pertinent test results, reviewed documented beta blocker date and time   History of Anesthesia Complications Negative for: history of anesthetic complications  Airway Mallampati: II TM Distance: >3 FB Neck ROM: Full    Dental  (+) Teeth Intact, Dental Advisory Given, Poor Dentition   Pulmonary former smoker,    Pulmonary exam normal       Cardiovascular + Peripheral Vascular Disease  Echo 2014: Study Conclusions  - Left ventricle: Technically limited study. The cavity size   was mildly dilated. Wall thickness was increased in a   pattern of mild LVH. The estimated ejection fraction was   55%. Regional wall motion abnormalities cannot be   excluded.    Neuro/Psych Seizures -,  Anxiety Depression Bipolar Disorder Encephalopathy    GI/Hepatic GERD-  ,(+) Hepatitis -, C  Endo/Other  diabetes, Type 2, Insulin DependentHypothyroidism Morbid obesity  Renal/GU negative Renal ROS     Musculoskeletal  (+) Fibromyalgia -  Abdominal   Peds  Hematology   Anesthesia Other Findings Worn teeth, sticky with residue.  Needs oral hygiene.  Reproductive/Obstetrics                        Anesthesia Physical Anesthesia Plan  ASA: III  Anesthesia Plan: General   Post-op Pain Management:    Induction: Intravenous  Airway Management Planned: Oral ETT  Additional Equipment: CVP and Arterial line  Intra-op Plan:   Post-operative Plan: Possible Post-op intubation/ventilation  Informed Consent: I have reviewed the patients History and Physical, chart, labs and discussed the procedure including the risks, benefits and alternatives for the proposed anesthesia with the patient or authorized representative who has indicated his/her understanding  and acceptance.   Dental advisory given  Plan Discussed with: CRNA, Anesthesiologist and Surgeon  Anesthesia Plan Comments:        Anesthesia Quick Evaluation

## 2013-07-25 ENCOUNTER — Inpatient Hospital Stay (HOSPITAL_COMMUNITY): Payer: Medicare Other

## 2013-07-25 DIAGNOSIS — E039 Hypothyroidism, unspecified: Secondary | ICD-10-CM

## 2013-07-25 DIAGNOSIS — F1010 Alcohol abuse, uncomplicated: Secondary | ICD-10-CM

## 2013-07-25 DIAGNOSIS — R188 Other ascites: Secondary | ICD-10-CM

## 2013-07-25 DIAGNOSIS — D638 Anemia in other chronic diseases classified elsewhere: Secondary | ICD-10-CM

## 2013-07-25 DIAGNOSIS — R4182 Altered mental status, unspecified: Secondary | ICD-10-CM

## 2013-07-25 LAB — GLUCOSE, CAPILLARY
GLUCOSE-CAPILLARY: 138 mg/dL — AB (ref 70–99)
GLUCOSE-CAPILLARY: 181 mg/dL — AB (ref 70–99)
Glucose-Capillary: 134 mg/dL — ABNORMAL HIGH (ref 70–99)
Glucose-Capillary: 136 mg/dL — ABNORMAL HIGH (ref 70–99)
Glucose-Capillary: 153 mg/dL — ABNORMAL HIGH (ref 70–99)
Glucose-Capillary: 174 mg/dL — ABNORMAL HIGH (ref 70–99)

## 2013-07-25 LAB — CBC
HCT: 18.4 % — ABNORMAL LOW (ref 39.0–52.0)
HCT: 20.2 % — ABNORMAL LOW (ref 39.0–52.0)
HEMOGLOBIN: 7.1 g/dL — AB (ref 13.0–17.0)
Hemoglobin: 6.1 g/dL — CL (ref 13.0–17.0)
MCH: 33.9 pg (ref 26.0–34.0)
MCH: 35.1 pg — AB (ref 26.0–34.0)
MCHC: 33.2 g/dL (ref 30.0–36.0)
MCHC: 35.1 g/dL (ref 30.0–36.0)
MCV: 102.2 fL — ABNORMAL HIGH (ref 78.0–100.0)
MCV: 100 fL (ref 78.0–100.0)
PLATELETS: 43 10*3/uL — AB (ref 150–400)
Platelets: 32 10*3/uL — ABNORMAL LOW (ref 150–400)
RBC: 1.8 MIL/uL — AB (ref 4.22–5.81)
RBC: 2.02 MIL/uL — ABNORMAL LOW (ref 4.22–5.81)
RDW: 20.5 % — AB (ref 11.5–15.5)
RDW: 19.7 % — ABNORMAL HIGH (ref 11.5–15.5)
WBC: 6 10*3/uL (ref 4.0–10.5)
WBC: 6.4 10*3/uL (ref 4.0–10.5)

## 2013-07-25 LAB — URINALYSIS, ROUTINE W REFLEX MICROSCOPIC
GLUCOSE, UA: NEGATIVE mg/dL
Ketones, ur: 15 mg/dL — AB
Nitrite: POSITIVE — AB
Protein, ur: 30 mg/dL — AB
SPECIFIC GRAVITY, URINE: 1.023 (ref 1.005–1.030)
pH: 5 (ref 5.0–8.0)

## 2013-07-25 LAB — BASIC METABOLIC PANEL
BUN: 24 mg/dL — ABNORMAL HIGH (ref 6–23)
CALCIUM: 7.1 mg/dL — AB (ref 8.4–10.5)
CO2: 31 mEq/L (ref 19–32)
Chloride: 103 mEq/L (ref 96–112)
Creatinine, Ser: 1.01 mg/dL (ref 0.50–1.35)
GFR, EST NON AFRICAN AMERICAN: 80 mL/min — AB (ref >90)
Glucose, Bld: 150 mg/dL — ABNORMAL HIGH (ref 70–99)
Potassium: 3.7 mEq/L (ref 3.7–5.3)
SODIUM: 141 meq/L (ref 137–147)

## 2013-07-25 LAB — PREPARE FRESH FROZEN PLASMA
UNIT DIVISION: 0
UNIT DIVISION: 0

## 2013-07-25 LAB — URINE MICROSCOPIC-ADD ON

## 2013-07-25 LAB — PROTIME-INR
INR: 2.45 — AB (ref 0.00–1.49)
INR: 2.91 — ABNORMAL HIGH (ref 0.00–1.49)
PROTHROMBIN TIME: 25.8 s — AB (ref 11.6–15.2)
Prothrombin Time: 29.4 seconds — ABNORMAL HIGH (ref 11.6–15.2)

## 2013-07-25 LAB — PREPARE RBC (CROSSMATCH)

## 2013-07-25 LAB — HEMOGLOBIN AND HEMATOCRIT, BLOOD
HCT: 18 % — ABNORMAL LOW (ref 39.0–52.0)
Hemoglobin: 6.3 g/dL — CL (ref 13.0–17.0)

## 2013-07-25 MED ORDER — LACTULOSE 10 GM/15ML PO SOLN
40.0000 g | Freq: Two times a day (BID) | ORAL | Status: DC
  Administered 2013-07-25 – 2013-08-07 (×26): 40 g via ORAL
  Filled 2013-07-25 (×29): qty 60

## 2013-07-25 MED ORDER — SODIUM CHLORIDE 0.9 % IV SOLN
INTRAVENOUS | Status: DC
  Administered 2013-07-25 – 2013-07-27 (×3): via INTRAVENOUS

## 2013-07-25 MED ORDER — LEVETIRACETAM 500 MG PO TABS
1000.0000 mg | ORAL_TABLET | Freq: Two times a day (BID) | ORAL | Status: DC
  Administered 2013-07-25 – 2013-08-07 (×26): 1000 mg via ORAL
  Filled 2013-07-25 (×28): qty 2

## 2013-07-25 MED ORDER — SODIUM CHLORIDE 0.9 % IV BOLUS (SEPSIS)
500.0000 mL | Freq: Once | INTRAVENOUS | Status: AC
  Administered 2013-07-25: 500 mL via INTRAVENOUS

## 2013-07-25 MED ORDER — BIOTENE DRY MOUTH MT LIQD
15.0000 mL | Freq: Two times a day (BID) | OROMUCOSAL | Status: DC
  Administered 2013-07-26 – 2013-07-27 (×3): 15 mL via OROMUCOSAL

## 2013-07-25 MED ORDER — PANTOPRAZOLE SODIUM 40 MG PO TBEC
40.0000 mg | DELAYED_RELEASE_TABLET | Freq: Every day | ORAL | Status: DC
  Administered 2013-07-25 – 2013-08-07 (×14): 40 mg via ORAL
  Filled 2013-07-25 (×15): qty 1

## 2013-07-25 MED ORDER — FOLIC ACID 1 MG PO TABS
1.0000 mg | ORAL_TABLET | Freq: Every day | ORAL | Status: DC
  Administered 2013-07-26 – 2013-08-07 (×13): 1 mg via ORAL
  Filled 2013-07-25 (×13): qty 1

## 2013-07-25 MED ORDER — LORAZEPAM 1 MG PO TABS
1.0000 mg | ORAL_TABLET | ORAL | Status: DC | PRN
  Administered 2013-07-27 (×2): 1 mg via ORAL
  Filled 2013-07-25 (×2): qty 1

## 2013-07-25 MED ORDER — LORAZEPAM 2 MG/ML IJ SOLN
1.0000 mg | INTRAMUSCULAR | Status: DC | PRN
  Administered 2013-07-25: 2 mg via INTRAVENOUS
  Filled 2013-07-25: qty 1

## 2013-07-25 MED ORDER — ALBUTEROL SULFATE (2.5 MG/3ML) 0.083% IN NEBU: 2.5000 mg | INHALATION_SOLUTION | RESPIRATORY_TRACT | Status: DC | PRN

## 2013-07-25 MED ORDER — VITAMIN K1 10 MG/ML IJ SOLN
5.0000 mg | Freq: Once | INTRAVENOUS | Status: AC
  Administered 2013-07-25: 5 mg via INTRAVENOUS
  Filled 2013-07-25: qty 0.5

## 2013-07-25 MED ORDER — INSULIN ASPART 100 UNIT/ML ~~LOC~~ SOLN
0.0000 [IU] | Freq: Three times a day (TID) | SUBCUTANEOUS | Status: DC
  Administered 2013-07-25: 3 [IU] via SUBCUTANEOUS
  Administered 2013-07-25: 2 [IU] via SUBCUTANEOUS
  Administered 2013-07-26: 3 [IU] via SUBCUTANEOUS
  Administered 2013-07-26: 2 [IU] via SUBCUTANEOUS
  Administered 2013-07-26: 5 [IU] via SUBCUTANEOUS
  Administered 2013-07-27 (×3): 3 [IU] via SUBCUTANEOUS
  Administered 2013-07-28: 2 [IU] via SUBCUTANEOUS
  Administered 2013-07-29: 3 [IU] via SUBCUTANEOUS
  Administered 2013-07-29: 2 [IU] via SUBCUTANEOUS
  Administered 2013-07-29: 3 [IU] via SUBCUTANEOUS
  Administered 2013-07-30 (×2): 2 [IU] via SUBCUTANEOUS
  Administered 2013-07-30 – 2013-07-31 (×3): 3 [IU] via SUBCUTANEOUS
  Administered 2013-07-31: 2 [IU] via SUBCUTANEOUS
  Administered 2013-08-01: 3 [IU] via SUBCUTANEOUS
  Administered 2013-08-02 (×2): 2 [IU] via SUBCUTANEOUS
  Administered 2013-08-02 – 2013-08-03 (×2): 5 [IU] via SUBCUTANEOUS
  Administered 2013-08-03: 3 [IU] via SUBCUTANEOUS
  Administered 2013-08-04: 2 [IU] via SUBCUTANEOUS
  Administered 2013-08-04: 3 [IU] via SUBCUTANEOUS
  Administered 2013-08-04: 2 [IU] via SUBCUTANEOUS

## 2013-07-25 MED ORDER — VITAMIN B-1 100 MG PO TABS
100.0000 mg | ORAL_TABLET | Freq: Every day | ORAL | Status: DC
  Administered 2013-07-26 – 2013-08-07 (×13): 100 mg via ORAL
  Filled 2013-07-25 (×13): qty 1

## 2013-07-25 NOTE — Progress Notes (Signed)
[facility] Progress Note Patient Name: Tony Boyd DOB: 02-17-1956 MRN: [id number]  Date of Service  07/25/2013   HPI/Events of Note  Hgb up from 5.4 to 6.1 following administration of 2 units of pRBC.   eICU Interventions  Plan: Transfuse 1 unit pRBC Post-transfusion CBC   Intervention Category Intermediate Interventions: Bleeding - evaluation and treatment with blood products  [name] 07/25/2013, 5:51 AM

## 2013-07-25 NOTE — Progress Notes (Signed)
PT Cancellation Note  Patient Details Name: Tony Boyd MRN: [id number] DOB: 10/04/1955   Cancelled Treatment:    Reason Eval/Treat Not Completed: Medical issues which prohibited therapy (HgB 6.1 will hold PT today)   Duncan Dull 07/25/2013, 7:02 AM Alben Deeds, PT DPT  [phone]

## 2013-07-25 NOTE — Progress Notes (Signed)
Name: Tony Boyd MRN: [id number] DOB: 30-Nov-1955  ELECTRONIC ICU PHYSICIAN NOTE  Problem:  Anemia  Recent Labs Lab 07/24/13 1732 07/25/13 0451 07/25/13 1610  HGB 5.4* 6.1* 6.3*     Intervention:  Transfuse one more unit prbcs  Tanda Rockers 07/25/2013, 5:40 PM

## 2013-07-25 NOTE — Consult Note (Signed)
PULMONARY / CRITICAL CARE MEDICINE   Name: Tony Boyd MRN: [id number] DOB: [birth year]    ADMISSION DATE:  07/23/2013 CONSULTATION DATE:  07/24/2013  REFERRING MD :  Leisa Lenz  CHIEF COMPLAINT:  [address] down  BRIEF PATIENT DESCRIPTION: 58 yo former smoker with history of ETOH / Hepatitis liver disease fell at home resulting in L intertrochanteric fx and pulmonary contusion.  Had Nailing of hip on 5/29 associated with significant blood loss, and remained on vent post-op.  PCCM assumed care in ICU.  SIGNIFICANT EVENTS: 5/28 Admit, ortho consulted 5/29 Nailing of Lt intertrochanteric fx 5/30 Transfuse 1 unit PRBC  STUDIES:  CT head 5/28 >> no acute process Pelvis xray 5/28 >> Left femoral intertrochanteric fracture  LINES / TUBES: ETT 5/29 >>> R IJ double lumen CVL 5/29 >> R R AL 5/29 >>> 5/30  CULTURES: 5/29  MRSA PCR  >>> POSITIVE  ANTIBIOTICS: Clindamycin 5/29 >>> 5/30  SUBJECTIVE:  Getting blood.  Tolerating pressure support.  VITAL SIGNS: Temp:  [93.7 F (34.3 C)-99.2 F (37.3 C)] 99 F (37.2 C) (05/30 0650) Pulse Rate:  [63-88] 81 (05/30 0700) Resp:  [0-23] 8 (05/30 0700) BP: (84-137)/(33-81) 87/38 mmHg (05/30 0700) SpO2:  [97 %-100 %] 98 % (05/30 0700) Arterial Line BP: (77-143)/(29-69) 88/37 mmHg (05/30 0700) FiO2 (%):  [40 %-50 %] 40 % (05/30 0600) Weight:  [290 lb 2 oz (131.6 kg)] 290 lb 2 oz (131.6 kg) (05/29 1515)  HEMODYNAMICS: CVP:  [11 mmHg] 11 mmHg VENTILATOR SETTINGS: Vent Mode:  [-] PRVC FiO2 (%):  [40 %-50 %] 40 % Set Rate:  [14 bmp] 14 bmp Vt Set:  [580 mL] 580 mL PEEP:  [5 cmH20] 5 cmH20 Plateau Pressure:  [20 cmH20-30 cmH20] 20 cmH20  INTAKE / OUTPUT: Intake/Output     05/29 0701 - 05/30 0700 05/30 0701 - 05/31 0700   I.V. (mL/kg) 3150 (23.9)    Blood 355.5    NG/GT 100    IV Piggyback 1210    Total Intake(mL/kg) 4815.5 (36.6)    Urine (mL/kg/hr) 260 (0.1)    Stool 100 (0)    Blood 2000 (0.6)    Total Output 2360     Net +2455.5             PHYSICAL EXAMINATION: General: no distress Neuro: sleepy, follows commands, RASS -1 HEENT: ETT in place Cardiovascular: regular Lungs: no wheeze Abdomen: mild distension, soft Musculoskeletal:  1+ edema Skin: no rashes  LABS:  CBC  Recent Labs Lab 07/23/13 1535 07/24/13 0850 07/24/13 1732 07/25/13 0451  WBC 8.2 6.5  --  6.0  HGB 9.9* 7.9* 5.4* 6.1*  HCT 29.1* 23.8* 16.0* 18.4*  PLT 42* 35*  --  43*   Coag's  Recent Labs Lab 07/24/13 1208 07/24/13 1800 07/25/13 0451  INR 2.23* 2.48* 2.45*   BMET  Recent Labs Lab 07/23/13 1535 07/24/13 0850 07/24/13 1732 07/25/13 0451  NA 138 138 140 141  K 3.9 3.9 3.8 3.7  CL 98 100  --  103  CO2 31 31  --  31  BUN 9 14  --  24*  CREATININE 0.49* 0.56  --  1.01  GLUCOSE 151* 127*  --  150*   Electrolytes  Recent Labs Lab 07/23/13 1535 07/24/13 0850 07/25/13 0451  CALCIUM 7.6* 7.6* 7.1*   ABG  Recent Labs Lab 07/24/13 1732 07/24/13 1808  PHART 7.472* 7.438  PCO2ART 46.6* 49.0*  PO2ART 120.0* 135.0*    Liver Enzymes  Recent Labs Lab  07/23/13 1535  AST 43*  ALT 16  ALKPHOS 168*  BILITOT 5.5*  ALBUMIN 1.9*   Glucose  Recent Labs Lab 07/24/13 0003 07/24/13 0414 07/24/13 1105 07/24/13 2105 07/25/13 0015 07/25/13 0452  GLUCAP 134* 121* 113* 132* 153* 134*   IMAGING:   Dg Chest 1 View  07/23/2013   CLINICAL DATA:  Fall.  Chest pain.  EXAM: CHEST - 1 VIEW  COMPARISON:  06/30/2013  FINDINGS: Asymmetric opacity is seen in the left midlung, consistent with pulmonary contusion or other pneumonia. Right lung is clear. No evidence of pneumothorax or hemothorax. Heart size is within normal limits. No evidence of mediastinal widening.  IMPRESSION: Left mid lung airspace opacity, consistent pulmonary contusion or other pneumonia. Recommend chest radiographic followup in several weeks to confirm resolution.   Electronically Signed   By: Earle Gell M.D.   On: 07/23/2013 17:04   Dg Lumbar  Spine 2-3 Views  07/23/2013   CLINICAL DATA:  Post fall, now with low back pain  EXAM: LUMBAR SPINE - 2-3 VIEW  COMPARISON:  Pelvic and left femur radiographs - earlier same day; lumbar spine radiographs - 04/09/2013; CT abdomen pelvis -02/18/2013  FINDINGS: There are 5 non rib-bearing lumbar type vertebral bodies.  Interval development of a moderate (approximately 40%) compression deformity involving the L2 vertebral body with associated increased sclerosis of the L2 vertebral body. No definite retropulsion.  Unchanged mild (< 25%) compression deformity involving the superior endplate of the L4 vertebral body.  Remaining vertebral body heights appear preserved.  Unchanged mild multilevel lumbar spine DDD, likely worse at L3-L4 with disk space height loss, endplate irregularity and sclerosis.  Limited visualization the bilateral SI joints is normal.  The known minimally displaced left intertrochanteric femur fracture is not well demonstrated on the present examination.  There is mild gaseous distention of several loops of small bowel within the left mid hemiabdomen without evidence of obstruction. Regional soft tissues appear normal.  IMPRESSION: 1. The minimally displaced left-sided intertrochanteric femur fracture is not well demonstrated on the present examination. Please refer to dedicated pelvic and left femur radiographs performed earlier same day. 2. Interval development of a moderate (approximately 40%) compression deformity involving the L2 vertebral body - correlation for point tenderness at this location is recommended. 3. Unchanged mild (< 25%) compression deformity involving the superior endplate of the L4 vertebral body. 4. Unchanged mild multilevel lumbar spine DDD, worse at L3-L4.   Electronically Signed   By: Sandi Mariscal M.D.   On: 07/23/2013 17:17   Dg Pelvis 1-2 Views  07/23/2013   CLINICAL DATA:  Fall, pain.  EXAM: PELVIS - 1-2 VIEW  COMPARISON:  Left femur series performed today.  FINDINGS:  There is a left femoral intertrochanteric fracture. Mild lateral displacement. No subluxation or dislocation. Mild degenerative changes in the hips bilaterally.  IMPRESSION: Left femoral intertrochanteric fracture.   Electronically Signed   By: Rolm Baptise M.D.   On: 07/23/2013 17:06   Dg Femur Left  07/24/2013   CLINICAL DATA:  left femoral nail  EXAM: LEFT FEMUR - 2 VIEW  COMPARISON:  None.  FINDINGS: Images demonstrate placement of a dynamic compression screw an intra medullary rod within the left femur. Anatomic alignment. No breakage or loosening of the hardware.  IMPRESSION: ORIF left femur fracture.   Electronically Signed   By: Maryclare Bean M.D.   On: 07/24/2013 15:55   Dg Femur Left  07/23/2013   CLINICAL DATA:  Fall, pain.  EXAM: LEFT FEMUR -  2 VIEW  COMPARISON:  None.  FINDINGS: There is a left femoral intertrochanteric fracture. Mild displacement and impaction. No subluxation or dislocation of the femoral head.  IMPRESSION: Mildly displaced and impacted left femoral intertrochanteric fracture.   Electronically Signed   By: Rolm Baptise M.D.   On: 07/23/2013 17:05   Ct Head Wo Contrast  07/23/2013   CLINICAL DATA:  Status post fall  EXAM: CT HEAD WITHOUT CONTRAST  TECHNIQUE: Contiguous axial images were obtained from the base of the skull through the vertex without intravenous contrast.  COMPARISON:  MR brain 06/01/2013, CT brain 05/31/2013, CT brain 03/23/2013  FINDINGS: There is no evidence of mass effect, midline shift or extra-axial fluid collections. There is no evidence of a space-occupying lesion or intracranial hemorrhage. There is no evidence of a cortical-based area of acute infarction. Mild periventricular white matter low attenuation likely secondary to microangiopathy. Mild generalized cerebral cortical atrophy.  The ventricles and sulci are appropriate for the patient's age. The basal cisterns are patent.  Visualized portions of the orbits are unremarkable. Left maxillary sinus mucous  retention cyst. Complete opacification of the left sphenoid sinus.  The osseous structures are unremarkable.  IMPRESSION: No acute intracranial pathology.   Electronically Signed   By: Kathreen Devoid   On: 07/23/2013 17:01   Dg Chest Port 1 View  07/24/2013   CLINICAL DATA:  Status post central line placement  EXAM: PORTABLE CHEST - 1 VIEW  COMPARISON:  None.  FINDINGS: Cardiac shadow is at the upper limits of normal in size. A right-sided pleural effusion is noted with likely underlying atelectasis. No pneumothorax is noted. A right jugular central line is seen in the proximal superior vena cava. The endotracheal tube is noted 13 mm above the carina and likely could be withdrawn 1-2 cm. A nasogastric catheter extends into the stomach.  IMPRESSION: Left basilar effusion with likely underlying atelectasis.  Tubes and lines as described. The endotracheal tube should be withdrawn 1-2 cm.  These results were called by telephone at the time of interpretation on 07/24/2013 at 3:51 PM to [facility], the pts nurse, who verbally acknowledged these results.   Electronically Signed   By: Inez Catalina M.D.   On: 07/24/2013 15:51   Dg Knee Complete 4 Views Left  07/23/2013   CLINICAL DATA:  Fall, right lower extremity pain  EXAM: LEFT KNEE - COMPLETE 4+ VIEW  COMPARISON:  Concurrently obtained radiographs of the pelvis and femur  FINDINGS: No acute fracture, malalignment or knee joint effusion. Mild degenerative changes predominantly in the patellofemoral compartment. Normal bony mineralization. No lytic or blastic osseous lesion. Diffuse reticulation of the subcutaneous fat suggests lower extremity edema.  IMPRESSION: 1. No acute fracture, malalignment or joint effusion. 2. Mild patellofemoral osteoarthritis. 3. Diffuse soft tissue edema versus cellulitis.   Electronically Signed   By: Jacqulynn Cadet M.D.   On: 07/23/2013 17:05   Dg C-arm 1-60 Min  07/24/2013   CLINICAL DATA:  Surgery in progress  EXAM: DG C-ARM 61-120 MIN   COMPARISON:  None.  FINDINGS: Fluoroscopy was utilized by the Orthopedic service.  IMPRESSION: See above.   Electronically Signed   By: Maryclare Bean M.D.   On: 07/24/2013 15:54   ASSESSMENT / PLAN:  PULMONARY A: Pulmonary contusion after fall. Post-operative ventilator dependence. Hx of smoking. P:   Pressure support wean as tolerated >> hopefully extubate soon F/u CXR PRN Bronchodilators  CARDIOVASCULAR A:  Transient hypotension due to sedation / hypovolemia, responded to fluids.  P:  Hold Nadolol Continue IV fluids  RENAL A:   CKD. Oliguria. P:   Trend BMP Hold Lasix / Aldactone as hypotensive  GASTROINTESTINAL A:   ETOH / Hepatitis related liver cirrhosis. GERD. P:   NPO TF if unable to extubate soon Protonix  HEMATOLOGIC A:   Anemia of chronic disease and operative bleeding. Thrombocytopenia, coagulopathy 2nd to liver disease and ETOH. P:  Trend CBC, INR Continue ferrous sulfate SCD's Would not anticoagulate ( per Ortho recs ); if risk of VTE is high consider IVC filter  Transfuse PRBC for Hb < 7 or bleeding Will give 1 unit FFP on 5/30  INFECTIOUS A:   Post-op prophylaxis >> completed 5/30. P:   Monitor clinically  ENDOCRINE A:   DM Hypothyroidism   P:   SSI Continue Levothyroxine Hold Lantus  NEUROLOGIC A:   At risk for alcohol withdrawal Hx of hepatic encephalopathy, bipolar disorder, seizures ICU pain / sedation P:   RASS goal 0 to -1 Continue Lactolose >> decrease to BID on 5/30, Rifaximin Continue Keppra Continue Paxil, Risperdal Thiamine / Folate CIWA q4h Fentanyl / Versed PRN  ORTHOPEDICS A: Lt intertrochanteric fracture s/p nailing 5/29. P: Post-op care per orthopedics  CC timd 35 minutes.  Chesley Mires, MD [facility] Pulmonary/Critical Care 07/25/2013, 7:46 AM Pager:  [phone] After 3pm call: [phone]

## 2013-07-25 NOTE — Progress Notes (Signed)
07/25/2013- Resp care note- Pt suctioned and extubated as per MD order at [place].  Vital signs post extubation- hr86, sats 98% on 4lpm cannula, rr18, pt 107/37.  Pt tolerated procedure well with pt vocalizing post extubation.  RN at bedside for procedure.  Ventilator and ambu-bag at bedside.  No complications noted.  Will follow progress and wean oxygen as tolerated.  s Adelina Collard rrt, rcp

## 2013-07-26 ENCOUNTER — Inpatient Hospital Stay (HOSPITAL_COMMUNITY): Payer: Medicare Other

## 2013-07-26 DIAGNOSIS — K7290 Hepatic failure, unspecified without coma: Secondary | ICD-10-CM

## 2013-07-26 DIAGNOSIS — G40909 Epilepsy, unspecified, not intractable, without status epilepticus: Secondary | ICD-10-CM

## 2013-07-26 DIAGNOSIS — K7682 Hepatic encephalopathy: Secondary | ICD-10-CM

## 2013-07-26 DIAGNOSIS — D6959 Other secondary thrombocytopenia: Secondary | ICD-10-CM

## 2013-07-26 LAB — CBC
HCT: 20.4 % — ABNORMAL LOW (ref 39.0–52.0)
HEMATOCRIT: 25 % — AB (ref 39.0–52.0)
HEMOGLOBIN: 6.7 g/dL — AB (ref 13.0–17.0)
HEMOGLOBIN: 8.5 g/dL — AB (ref 13.0–17.0)
MCH: 33.3 pg (ref 26.0–34.0)
MCH: 33.2 pg (ref 26.0–34.0)
MCHC: 32.8 g/dL (ref 30.0–36.0)
MCHC: 34 g/dL (ref 30.0–36.0)
MCV: 101.5 fL — AB (ref 78.0–100.0)
MCV: 97.7 fL (ref 78.0–100.0)
Platelets: 33 10*3/uL — ABNORMAL LOW (ref 150–400)
Platelets: 37 10*3/uL — ABNORMAL LOW (ref 150–400)
RBC: 2.01 MIL/uL — ABNORMAL LOW (ref 4.22–5.81)
RBC: 2.56 MIL/uL — ABNORMAL LOW (ref 4.22–5.81)
RDW: 19.9 % — ABNORMAL HIGH (ref 11.5–15.5)
RDW: 19.5 % — AB (ref 11.5–15.5)
WBC: 5.3 10*3/uL (ref 4.0–10.5)
WBC: 7.2 10*3/uL (ref 4.0–10.5)

## 2013-07-26 LAB — BASIC METABOLIC PANEL
BUN: 31 mg/dL — ABNORMAL HIGH (ref 6–23)
CALCIUM: 7.1 mg/dL — AB (ref 8.4–10.5)
CO2: 31 mEq/L (ref 19–32)
Chloride: 102 mEq/L (ref 96–112)
Creatinine, Ser: 1.16 mg/dL (ref 0.50–1.35)
GFR calc Af Amer: 78 mL/min — ABNORMAL LOW (ref >90)
GFR, EST NON AFRICAN AMERICAN: 68 mL/min — AB (ref >90)
Glucose, Bld: 165 mg/dL — ABNORMAL HIGH (ref 70–99)
Potassium: 3.3 mEq/L — ABNORMAL LOW (ref 3.7–5.3)
Sodium: 138 mEq/L (ref 137–147)

## 2013-07-26 LAB — GLUCOSE, CAPILLARY
GLUCOSE-CAPILLARY: 145 mg/dL — AB (ref 70–99)
Glucose-Capillary: 230 mg/dL — ABNORMAL HIGH (ref 70–99)
Glucose-Capillary: 177 mg/dL — ABNORMAL HIGH (ref 70–99)
Glucose-Capillary: 180 mg/dL — ABNORMAL HIGH (ref 70–99)

## 2013-07-26 LAB — PROTIME-INR
INR: 2 — ABNORMAL HIGH (ref 0.00–1.49)
Prothrombin Time: 22.1 seconds — ABNORMAL HIGH (ref 11.6–15.2)

## 2013-07-26 LAB — PREPARE RBC (CROSSMATCH)

## 2013-07-26 LAB — PREPARE FRESH FROZEN PLASMA: UNIT DIVISION: 0

## 2013-07-26 MED ORDER — POTASSIUM CHLORIDE CRYS ER 20 MEQ PO TBCR
40.0000 meq | EXTENDED_RELEASE_TABLET | Freq: Once | ORAL | Status: AC
  Administered 2013-07-26: 40 meq via ORAL
  Filled 2013-07-26 (×2): qty 2

## 2013-07-26 MED ORDER — NOREPINEPHRINE BITARTRATE 1 MG/ML IV SOLN
2.0000 ug/min | INTRAVENOUS | Status: DC
  Administered 2013-07-26: 5 ug/min via INTRAVENOUS
  Filled 2013-07-26: qty 8

## 2013-07-26 MED ORDER — SODIUM CHLORIDE 0.9 % IV BOLUS (SEPSIS)
500.0000 mL | Freq: Once | INTRAVENOUS | Status: AC
  Administered 2013-07-26: 500 mL via INTRAVENOUS

## 2013-07-26 NOTE — Progress Notes (Signed)
[place] Progress Note Patient Name: Bernardino Dowell DOB: 1955-12-05 MRN: [id number]  Date of Service  07/26/2013   HPI/Events of Note  Hypokalemia  eICU Interventions  Potassium replaced   Intervention Category Intermediate Interventions: Electrolyte abnormality - evaluation and management  [name] 07/26/2013, 5:57 AM

## 2013-07-26 NOTE — Progress Notes (Signed)
PULMONARY / CRITICAL CARE MEDICINE   Name: Tony Boyd MRN: [id number] DOB: Sep 24, 1955    ADMISSION DATE:  07/23/2013 CONSULTATION DATE:  07/24/2013  REFERRING MD :  Leisa Lenz  CHIEF COMPLAINT:  [address] down  BRIEF PATIENT DESCRIPTION: 58 yo former smoker with history of ETOH / Hepatitis liver disease fell at home resulting in L intertrochanteric fx and pulmonary contusion.  Had Nailing of hip on 5/29 associated with significant blood loss, and remained on vent post-op.  PCCM assumed care in ICU.  SIGNIFICANT EVENTS: 5/28 Admit, ortho consulted 5/29 Nailing of Lt intertrochanteric fx 5/30 Transfuse 1 unit PRBC 5/31  Add levophed, transfuse 2 units PRBC  STUDIES:  CT head 5/28 >> no acute process Pelvis xray 5/28 >> Left femoral intertrochanteric fracture  LINES / TUBES: ETT 5/29 >>> 5/30 R IJ double lumen CVL 5/29 >> R R AL 5/29 >>> 5/30  CULTURES: 5/29  MRSA PCR  >>> POSITIVE  ANTIBIOTICS: Clindamycin 5/29 >>> 5/30  SUBJECTIVE:  Getting blood.  Denies chest pain, dyspnea.  VITAL SIGNS: Temp:  [97.4 F (36.3 C)-99.8 F (37.7 C)] 98.3 F (36.8 C) (05/31 0830) Pulse Rate:  [79-89] 81 (05/31 0800) Resp:  [0-29] 11 (05/31 0800) BP: (81-120)/(35-101) 108/56 mmHg (05/31 0815) SpO2:  [90 %-100 %] 99 % (05/31 0800) Arterial Line BP: (93-100)/(36-40) 100/37 mmHg (05/30 1030) Weight:  [303 lb 5.7 oz (137.6 kg)] 303 lb 5.7 oz (137.6 kg) (05/31 0430)  HEMODYNAMICS: CVP:  [12 mmHg-29 mmHg] 20 mmHg  INTAKE / OUTPUT: Intake/Output     05/30 0701 - 05/31 0700 05/31 0701 - 06/01 0700   I.V. (mL/kg) 1159.9 (8.4) 65 (0.5)   Blood 607.5    NG/GT     IV Piggyback 160    Total Intake(mL/kg) 1927.4 (14) 65 (0.5)   Urine (mL/kg/hr) 525 (0.2) 175 (0.6)   Stool 400 (0.1)    Blood     Total Output 925 175   Net +1002.4 -110        Stool Occurrence 1 x      PHYSICAL EXAMINATION: General: no distress Neuro: alert, pleasantly confused HEENT: no sinus  tenderness Cardiovascular: regular Lungs: no wheeze Abdomen: mild distension, soft Musculoskeletal:  1+ edema Skin: no rashes  LABS:  CBC  Recent Labs Lab 07/25/13 0451 07/25/13 1610 07/25/13 2315 07/26/13 0445  WBC 6.0  --  6.4 5.3  HGB 6.1* 6.3* 7.1* 6.7*  HCT 18.4* 18.0* 20.2* 20.4*  PLT 43*  --  32* 33*   Coag's  Recent Labs Lab 07/25/13 0451 07/25/13 1538 07/26/13 0445  INR 2.45* 2.91* 2.00*   BMET  Recent Labs Lab 07/24/13 0850 07/24/13 1732 07/25/13 0451 07/26/13 0445  NA 138 140 141 138  K 3.9 3.8 3.7 3.3*  CL 100  --  103 102  CO2 31  --  31 31  BUN 14  --  24* 31*  CREATININE 0.56  --  1.01 1.16  GLUCOSE 127*  --  150* 165*   Electrolytes  Recent Labs Lab 07/24/13 0850 07/25/13 0451 07/26/13 0445  CALCIUM 7.6* 7.1* 7.1*   ABG  Recent Labs Lab 07/24/13 1732 07/24/13 1808  PHART 7.472* 7.438  PCO2ART 46.6* 49.0*  PO2ART 120.0* 135.0*    Liver Enzymes  Recent Labs Lab 07/23/13 1535  AST 43*  ALT 16  ALKPHOS 168*  BILITOT 5.5*  ALBUMIN 1.9*   Glucose  Recent Labs Lab 07/25/13 0452 07/25/13 9678 07/25/13 1128 07/25/13 1631 07/25/13 2131 07/26/13 9381  GLUCAP 134* 138* 136* 174* 181* 145*   IMAGING:   Dg Femur Left  07/24/2013   CLINICAL DATA:  left femoral nail  EXAM: LEFT FEMUR - 2 VIEW  COMPARISON:  None.  FINDINGS: Images demonstrate placement of a dynamic compression screw an intra medullary rod within the left femur. Anatomic alignment. No breakage or loosening of the hardware.  IMPRESSION: ORIF left femur fracture.   Electronically Signed   By: Maryclare Bean M.D.   On: 07/24/2013 15:55   Dg Chest Port 1 View  07/26/2013   CLINICAL DATA:  Followup pleural effusion  EXAM: PORTABLE CHEST - 1 VIEW  COMPARISON:  07/25/2013  FINDINGS: Right jugular line is again identified and stable. The cardiac shadow is within normal limits. The overall inspiratory effort is poor. Persistent left basilar infiltrate and effusion is  noted.  IMPRESSION: Persistent left infiltrate and effusion.   Electronically Signed   By: Inez Catalina M.D.   On: 07/26/2013 08:17   Dg Chest Port 1 View  07/25/2013   CLINICAL DATA:  Pulmonary contusion  EXAM: PORTABLE CHEST - 1 VIEW  COMPARISON:  07/24/2013; 07/23/2013; 06/30/2013  FINDINGS: Grossly unchanged borderline enlarged cardiac silhouette given persistently reduced lung volumes. Interval extubation and removal of enteric tube. Stable positioning of right jugular approach of the venous catheter tip projected over the mid SVC. No pneumothorax. Pulmonary vasculature remains indistinct with cephalization of flow. Grossly unchanged stress no change to minimal worsening of bilateral mid and lower lung heterogeneous opacities, left greater than right. Small left-sided effusion is not excluded. Unchanged bones.  IMPRESSION: 1. Interval extubation and removal of enteric tube. Otherwise, stable positioning of remaining support apparatus. No pneumothorax. 2. Grossly unchanged findings of pulmonary edema and suspected small left-sided effusion. 3. No change to slight worsening of bilateral mid and lower lung heterogeneous opacities, left greater than right, atelectasis versus infiltrate / contusion.   Electronically Signed   By: Sandi Mariscal M.D.   On: 07/25/2013 11:29   Dg Chest Port 1 View  07/24/2013   CLINICAL DATA:  Status post central line placement  EXAM: PORTABLE CHEST - 1 VIEW  COMPARISON:  None.  FINDINGS: Cardiac shadow is at the upper limits of normal in size. A right-sided pleural effusion is noted with likely underlying atelectasis. No pneumothorax is noted. A right jugular central line is seen in the proximal superior vena cava. The endotracheal tube is noted 13 mm above the carina and likely could be withdrawn 1-2 cm. A nasogastric catheter extends into the stomach.  IMPRESSION: Left basilar effusion with likely underlying atelectasis.  Tubes and lines as described. The endotracheal tube should  be withdrawn 1-2 cm.  These results were called by telephone at the time of interpretation on 07/24/2013 at 3:51 PM to [facility], the pts nurse, who verbally acknowledged these results.   Electronically Signed   By: Inez Catalina M.D.   On: 07/24/2013 15:51   Dg C-arm 1-60 Min  07/24/2013   CLINICAL DATA:  Surgery in progress  EXAM: DG C-ARM 61-120 MIN  COMPARISON:  None.  FINDINGS: Fluoroscopy was utilized by the Orthopedic service.  IMPRESSION: See above.   Electronically Signed   By: Maryclare Bean M.D.   On: 07/24/2013 15:54   ASSESSMENT / PLAN:  PULMONARY A: Pulmonary contusion after fall. Post-operative ventilator dependence >> resolved 5/30. Hx of smoking. P:   F/u CXR intermittently PRN Bronchodilators  CARDIOVASCULAR A:  Hypotension >> pressors added 5/31. P:  Hold Nadolol Wean  pressors to keep SBP > 90  RENAL A:   CKD. Oliguria. P:   Trend BMP Hold Lasix / Aldactone as hypotensive  GASTROINTESTINAL A:   ETOH / Hepatitis related liver cirrhosis. GERD. P:   Protonix  HEMATOLOGIC A:   Anemia of chronic disease and operative bleeding. Thrombocytopenia, coagulopathy 2nd to liver disease and ETOH. P:  Trend CBC, INR Continue ferrous sulfate SCD's Would not anticoagulate ( per Ortho recs ); if risk of VTE is high consider IVC filter  Transfuse PRBC for Hb < 7 or bleeding  INFECTIOUS A:   Post-op prophylaxis >> completed 5/30. P:   Monitor clinically  ENDOCRINE A:   DM Hypothyroidism   P:   SSI Continue Levothyroxine Hold Lantus  NEUROLOGIC A:   At risk for alcohol withdrawal Hx of hepatic encephalopathy, bipolar disorder, seizures P:   Continue Lactolose >> decrease to BID on 5/30, Rifaximin Continue Keppra Continue Paxil, Risperdal Thiamine / Folate CIWA q4h Fentanyl   ORTHOPEDICS A: Lt intertrochanteric fracture s/p nailing 5/29. P: Post-op care per orthopedics  CC timd 35 minutes.  Chesley Mires, MD [facility] Pulmonary/Critical  Care 07/26/2013, 9:02 AM Pager:  [phone] After 3pm call: [phone]

## 2013-07-26 NOTE — Progress Notes (Signed)
Patient ID: Tony Boyd, male   DOB: 10/22/1955, 58 y.o.   MRN: [id number] Postoperative day 2 status post intramedullary fixation for a left intertrochanteric hip fracture. Patient's therapy has been held secondary to his low hemoglobin. Patient may begin physical therapy after transfusion.

## 2013-07-26 NOTE — Progress Notes (Signed)
[place] Progress Note Patient Name: Kyngston Pickelsimer DOB: May 07, 1955 MRN: [id number]  Date of Service  07/26/2013   HPI/Events of Note  Patient with persistent anemia and hypotension.  Had received 1 unit pRBC last pm now with Hgb drop 7.1 to 6.7.  Hypotension wit oliguria.  Current BP of 96/38 (48).  CVP of 18.  Had received IVF bolus earlier prior to CVP check.   eICU Interventions  Plan: Transfuse 2 unit pRBC Post-transfusion CBC Start NE for BP support   Intervention Category Intermediate Interventions: Bleeding - evaluation and treatment with blood products;Hypotension - evaluation and management  Guadelupe Sabin Hensley Treat 07/26/2013, 5:47 AM

## 2013-07-26 NOTE — Evaluation (Signed)
Physical Therapy Evaluation Patient Details Name: Tony Boyd MRN: [id number] DOB: Apr 03, 1955 Today's Date: 07/26/2013   History of Present Illness  58 yo former smoker with history of ETOH / Hepatitis liver disease fell at home resulting in L intertrochanteric fx and pulmonary contusion.  Had Nailing of hip on 5/29 associated with significant blood loss, and remained on vent post-op.  Clinical Impression  Pt oob mobility greatly limited by LE edema, L LE surgical pain and distended abdomen. Pt unable to take steps with either foot due to limited L LE wbing tolerance. Pt lives alone and is unable to return home safely at this time due to increased physical assist required for safe mobility and ADLs. Pt to benefit from ST-SNF upon d/c to maximize functional recovery to return home as able.    Follow Up Recommendations SNF;Supervision/Assistance - 24 hour    Equipment Recommendations  None recommended by PT    Recommendations for Other Services       Precautions / Restrictions Precautions Precautions: Fall Restrictions Weight Bearing Restrictions: Yes LLE Weight Bearing: Weight bearing as tolerated      Mobility  Bed Mobility Overal bed mobility: Needs Assistance Bed Mobility: Supine to Sit;Sit to Supine     Supine to sit: +2 for physical assistance Sit to supine: +2 for physical assistance;Max assist   General bed mobility comments: assit for trunk elevation due to distended abdomen, maxA for L LE management  Transfers Overall transfer level: Needs assistance Equipment used: Rolling walker (2 wheeled) Transfers: Sit to/from Stand Sit to Stand: +2 physical assistance;Max assist         General transfer comment: took multiple attempts, pt limited by L hip pain and body habitus  Ambulation/Gait Ambulation/Gait assistance: Min assist;+2 physical assistance Ambulation Distance (Feet):  (pivoted on balls of feet to [facility], unable to clear either foot)             Stairs            Wheelchair Mobility    Modified Rankin (Stroke Patients Only)       Balance Overall balance assessment: Needs assistance Sitting-balance support: Single extremity supported;Feet supported Sitting balance-Leahy Scale: Fair         Standing balance comment: requires use of RW                             Pertinent Vitals/Pain L hip pain, did not rate    Home Living Family/patient expects to be discharged to:: Skilled nursing facility                 Additional Comments: pt was living alone, using RW sometimes PTA, has had a history of falls on the steps    Prior Function Level of Independence: Independent with assistive device(s)         Comments: uses 4 wheeled walker for ambulation     Hand Dominance   Dominant Hand: Right    Extremity/Trunk Assessment   Upper Extremity Assessment: Overall WFL for tasks assessed           Lower Extremity Assessment: LLE deficits/detail   LLE Deficits / Details: minimal active mvmt due to pain, noticable edema  Cervical / Trunk Assessment: Normal  Communication   Communication: No difficulties  Cognition Arousal/Alertness: Awake/alert Behavior During Therapy: WFL for tasks assessed/performed Overall Cognitive Status: Within Functional Limits for tasks assessed  General Comments      Exercises General Exercises - Lower Extremity Ankle Circles/Pumps: AROM;Both;10 reps;Seated Long Arc Quad: AROM;Both;10 reps;Seated      Assessment/Plan    PT Assessment Patient needs continued PT services  PT Diagnosis Difficulty walking   PT Problem List Decreased strength;Decreased activity tolerance;Decreased balance;Decreased mobility;Decreased range of motion  PT Treatment Interventions DME instruction;Gait training;Functional mobility training;Therapeutic exercise;Therapeutic activities   PT Goals (Current goals can be found in the Care Plan  section) Acute Rehab PT Goals Patient Stated Goal: home PT Goal Formulation: With patient Time For Goal Achievement: 08/02/13 Potential to Achieve Goals: Good    Frequency Min 4X/week   Barriers to discharge Decreased caregiver support pt lives alone    Co-evaluation               End of Session Equipment Utilized During Treatment: Gait belt Activity Tolerance: Patient limited by pain Patient left: in bed;with call bell/phone within reach;with nursing/sitter in room Nurse Communication: Mobility status         Time: 3567-0141 PT Time Calculation (min): 33 min   Charges:   PT Evaluation $Initial PT Evaluation Tier I: 1 Procedure PT Treatments $Therapeutic Activity: 8-22 mins   PT G CodesKoleen Distance Caly Pellum 07/26/2013, 3:04 PM  [name], PT, DPT Pager #: [phone] Office #: [phone]

## 2013-07-26 NOTE — Progress Notes (Signed)
CRITICAL VALUE ALERT  Critical value received:  HgB of 6.7  Date of notification:  07/26/2013  Time of notification:  0350  Critical value read back: Yes  Nurse who received alert: Denyse Amass   MD notified (1st page):  Selena Batten  Time of first page:  [phone]   Time MD responded: [phone]

## 2013-07-27 ENCOUNTER — Encounter (HOSPITAL_COMMUNITY): Payer: Self-pay | Admitting: Orthopaedic Surgery

## 2013-07-27 DIAGNOSIS — R609 Edema, unspecified: Secondary | ICD-10-CM

## 2013-07-27 LAB — TYPE AND SCREEN
ABO/RH(D): A POS
Antibody Screen: NEGATIVE
UNIT DIVISION: 0
UNIT DIVISION: 0
UNIT DIVISION: 0
Unit division: 0
Unit division: 0
Unit division: 0
Unit division: 0
Unit division: 0

## 2013-07-27 LAB — BASIC METABOLIC PANEL
BUN: 23 mg/dL (ref 6–23)
CALCIUM: 7.1 mg/dL — AB (ref 8.4–10.5)
CO2: 30 meq/L (ref 19–32)
CREATININE: 0.7 mg/dL (ref 0.50–1.35)
Chloride: 104 mEq/L (ref 96–112)
GFR calc Af Amer: >90 mL/min (ref >90)
GFR calc non Af Amer: >90 mL/min (ref >90)
Glucose, Bld: 172 mg/dL — ABNORMAL HIGH (ref 70–99)
Potassium: 3.6 mEq/L — ABNORMAL LOW (ref 3.7–5.3)
Sodium: 138 mEq/L (ref 137–147)

## 2013-07-27 LAB — GLUCOSE, CAPILLARY
GLUCOSE-CAPILLARY: 160 mg/dL — AB (ref 70–99)
GLUCOSE-CAPILLARY: 161 mg/dL — AB (ref 70–99)
Glucose-Capillary: 163 mg/dL — ABNORMAL HIGH (ref 70–99)
Glucose-Capillary: 124 mg/dL — ABNORMAL HIGH (ref 70–99)

## 2013-07-27 LAB — CBC
HEMATOCRIT: 22.8 % — AB (ref 39.0–52.0)
Hemoglobin: 7.6 g/dL — ABNORMAL LOW (ref 13.0–17.0)
MCH: 33.3 pg (ref 26.0–34.0)
MCHC: 33.3 g/dL (ref 30.0–36.0)
MCV: 100 fL (ref 78.0–100.0)
PLATELETS: 30 10*3/uL — AB (ref 150–400)
RBC: 2.28 MIL/uL — ABNORMAL LOW (ref 4.22–5.81)
RDW: 20.7 % — AB (ref 11.5–15.5)
WBC: 3.9 10*3/uL — ABNORMAL LOW (ref 4.0–10.5)

## 2013-07-27 MED ORDER — POTASSIUM CHLORIDE CRYS ER 20 MEQ PO TBCR
40.0000 meq | EXTENDED_RELEASE_TABLET | Freq: Four times a day (QID) | ORAL | Status: AC
  Administered 2013-07-27 (×2): 40 meq via ORAL
  Filled 2013-07-27 (×2): qty 2

## 2013-07-27 MED ORDER — OXYCODONE HCL 5 MG PO TABS
10.0000 mg | ORAL_TABLET | ORAL | Status: DC | PRN
  Administered 2013-07-27 – 2013-07-30 (×10): 10 mg via ORAL
  Filled 2013-07-27 (×10): qty 2

## 2013-07-27 MED ORDER — FUROSEMIDE 10 MG/ML IJ SOLN
40.0000 mg | Freq: Four times a day (QID) | INTRAMUSCULAR | Status: AC
  Administered 2013-07-27 (×2): 40 mg via INTRAVENOUS
  Filled 2013-07-27 (×2): qty 4

## 2013-07-27 MED ORDER — ALBUMIN HUMAN 5 % IV SOLN
25.0000 g | Freq: Four times a day (QID) | INTRAVENOUS | Status: AC
  Administered 2013-07-27 (×2): 25 g via INTRAVENOUS
  Filled 2013-07-27 (×2): qty 500
  Filled 2013-07-27 (×2): qty 250
  Filled 2013-07-27 (×2): qty 500

## 2013-07-27 NOTE — Progress Notes (Signed)
[facility] ADULT ICU REPLACEMENT PROTOCOL FOR AM LAB REPLACEMENT ONLY  The patient does not apply for the [facility] Adult ICU Electrolyte Replacment Protocol based on the criteria listed below:   1. Is GFR >/= 40 ml/min? yes  Patient's GFR today is >90 2. Is urine output >/= 0.5 ml/kg/hr for the last 6 hours? no Patient's UOP is 0.4 ml/kg/hr 3. Is BUN < 60 mg/dL? yes  Patient's BUN today is 23 4. Abnormal electrolyte(s):K+3.6 5. Ordered repletion with: NA 6. If a panic level lab has been reported, has the CCM MD in charge been notified? yes.   Physician:  Luther Redo [facility] 07/27/2013 6:06 AM

## 2013-07-27 NOTE — Progress Notes (Signed)
PULMONARY / CRITICAL CARE MEDICINE   Name: Tony Boyd MRN: [id number] DOB: [birth year]    ADMISSION DATE:  07/23/2013 CONSULTATION DATE:  07/24/2013  REFERRING MD :  Leisa Lenz  CHIEF COMPLAINT:  [address] down  BRIEF PATIENT DESCRIPTION: 58 yo former smoker with history of ETOH / Hepatitis liver disease fell at home resulting in L intertrochanteric fx and pulmonary contusion.  Had Nailing of hip on 5/29 associated with significant blood loss, and remained on vent post-op.  PCCM assumed care in ICU.  SIGNIFICANT EVENTS: 5/28 Admit, ortho consulted 5/29 Nailing of Lt intertrochanteric fx 5/30 Transfuse 1 unit PRBC 5/31  Add levophed, transfuse 2 units PRBC 6/01 Off vasopressors. Poorly oriented but no distress. SDU transfer ordered  STUDIES:  CT head 5/28 >> no acute process Pelvis xray 5/28 >> Left femoral intertrochanteric fracture  LINES / TUBES: ETT 5/29 >>> 5/30 R R AL 5/29 >>> 5/30 R IJ double lumen CVL 5/29 >>    CULTURES: 5/29  MRSA PCR  >>> POSITIVE  ANTIBIOTICS: Clindamycin 5/29 >>> 5/30  SUBJECTIVE:  Poorly oriented. No distress. No new complaints  VITAL SIGNS: Temp:  [97.9 F (36.6 C)-99.8 F (37.7 C)] 97.9 F (36.6 C) (06/01 1552) Pulse Rate:  [30-97] 81 (06/01 1500) Resp:  [13-30] 14 (06/01 1500) BP: (88-144)/(26-89) 109/53 mmHg (06/01 1500) SpO2:  [80 %-100 %] 98 % (06/01 1500)  HEMODYNAMICS: CVP:  [16 mmHg-29 mmHg] 29 mmHg  INTAKE / OUTPUT: Intake/Output     05/31 0701 - 06/01 0700 06/01 0701 - 06/02 0700   P.O. 1320    I.V. (mL/kg) 1331.4 (9.7) 200 (1.5)   Blood 670    IV Piggyback     Total Intake(mL/kg) 3321.4 (24.1) 200 (1.5)   Urine (mL/kg/hr) 1715 (0.5) 1625 (1.3)   Stool     Total Output 1715 1625   Net +1606.4 -1425        Stool Occurrence 1 x 1 x     PHYSICAL EXAMINATION: General: no distress, severe anasarca Neuro: no focal deficits HEENT: WNL Cardiovascular: RRR s M Lungs: Clear anteriorly Abdomen: mild distension, soft,  pitting edema on abdomen Ext: 4+ symmetric pitting edema Skin: chronic stasis changes in BLEs  LABS:  CBC  Recent Labs Lab 07/26/13 0445 07/26/13 1235 07/27/13 0430  WBC 5.3 7.2 3.9*  HGB 6.7* 8.5* 7.6*  HCT 20.4* 25.0* 22.8*  PLT 33* 37* 30*   Coag's  Recent Labs Lab 07/25/13 0451 07/25/13 1538 07/26/13 0445  INR 2.45* 2.91* 2.00*   BMET  Recent Labs Lab 07/25/13 0451 07/26/13 0445 07/27/13 0430  NA 141 138 138  K 3.7 3.3* 3.6*  CL 103 102 104  CO2 31 31 30   BUN 24* 31* 23  CREATININE 1.01 1.16 0.70  GLUCOSE 150* 165* 172*   Electrolytes  Recent Labs Lab 07/25/13 0451 07/26/13 0445 07/27/13 0430  CALCIUM 7.1* 7.1* 7.1*   ABG  Recent Labs Lab 07/24/13 1732 07/24/13 1808  PHART 7.472* 7.438  PCO2ART 46.6* 49.0*  PO2ART 120.0* 135.0*    Liver Enzymes  Recent Labs Lab 07/23/13 1535  AST 43*  ALT 16  ALKPHOS 168*  BILITOT 5.5*  ALBUMIN 1.9*   Glucose  Recent Labs Lab 07/26/13 0752 07/26/13 1242 07/26/13 1634 07/26/13 2153 07/27/13 0839 07/27/13 1229  GLUCAP 145* 230* 177* 180* 163* 160*   CXR: NNF    ASSESSMENT / PLAN:  PULMONARY A: LLL Atx and/or effusion Acute resp failure, resolved. Hx of smoking. P:   Cont  supp O2 as needed to maintain SpO2 > 92% Cont PRN nebs  CARDIOVASCULAR A:  Hypotension, resolved P:  Holding Nadolol  RENAL A:   AKI, resolved Anasarca, severe Hypoalbuminemia due to cirrhosis P:   Lasix with albumin X 2 ordered 6/01 Monitor BMET intermittently Monitor I/Os Correct electrolytes as indicated  GASTROINTESTINAL A:   ETOH / Hepatitis related cirrhosis. H/O GERD, chronic PPI use P:   SUP: PPI Cont diet as ordered  HEMATOLOGIC A:   Anemia of chronic disease Acute blood loss anemia due to operative bleeding. Severe thrombocytopenia,  Hepatic coagulopathy  P:  DVT px: SCDs Monitor CBC intermittently Transfuse per usual ICU guidelines Cont Fe SO4  INFECTIOUS A:   No  acute issues P:   Monitor clinically  ENDOCRINE A:   DM II Hypothyroidism   P:   Cont SSI Holding Lantus Continue Levothyroxine Hold Lantus  NEUROLOGIC A:   H/O EtOH abuse with ongoing use up to time of admission Chronic hepatic encephalopath Bipolar disorder H/O seizures Post op pain P:   Continue Lactolose Cont Rifaximin Continue Keppra Continue Paxil, Risperdal Cont Thiamine / Folate Cont Fentanyl   ORTHOPEDICS A: Lt intertrochanteric fracture s/p nailing 5/29. P: Post-op care per orthopedics  Transfer to SDU ordered 6/01. If no problems overnight, can probably safely transfer back to [facility] service 6/02   Merton Border, MD ; [facility] service Mobile [phone].  After 5:30 PM or weekends, call [phone]

## 2013-07-27 NOTE — Clinical Social Work Note (Signed)
Clinical Social Worker continuing to follow patient and family for support and discharge planning needs.  Patient is currently oriented to self only and intermittently oriented to place and time.  CSW attempted to reach patient daughter over the phone to further discuss SNF placement at discharge.  Patient daughter not able to receive voicemails on her phone at this time, therefore CSW to continue to call regarding discharge disposition.  FL2 on chart.  CSW initiated SNF search in [facility] based on CSW assessment, showing patient agreement with plan.  CSW to follow up with patient and daughter once available bed offers are ready.  CSW remains available for support and to facilitate patient discharge needs once medically stable.  Barbette Or, [place]

## 2013-07-27 NOTE — Progress Notes (Signed)
NUTRITION FOLLOW-UP  DOCUMENTATION CODES Per approved criteria  -Morbid Obesity   INTERVENTION: Ensure Complete po BID, each supplement provides 350 kcal and 13 grams of protein  NUTRITION DIAGNOSIS: Increased nutrient needs related to cirrhosis and fracture as evidenced by estimated needs; ongoing.   Goal: Pt to meet >/= 90% of their estimated nutrition needs, progressing.    Monitor:  Diet advancement, PO intake, weight trend, labs  ASSESSMENT: Pt admitted with left hip fracture s/p mechanical fall. Per MD note pt is alcoholic with ESLD/cirrhosis.  Pt sleeping, RN did not want this RD to wake. Pt is confused and unable to answer questions per RN. Pt ate poorly 6/1 and seemed more confused, but pt is drinking well.   Edema: generalized; scrotal edema Pt very edematous, receiving albumin.   Height: Ht Readings from Last 1 Encounters:  07/24/13 5\' 9"  (1.753 m)    Weight: Wt Readings from Last 1 Encounters:  07/26/13 303 lb 5.7 oz (137.6 kg)  Admission weight: 269 lb (122.2 kg) 5/28   BMI:  Body mass index is 44.78 kg/(m^2).  Estimated Nutritional Needs: Kcal: 2200-2400  Protein: 115-130 grams  Fluid: >/= 1.5 L daily  Skin:  Jaundiced Blister on arm Generalized edema  Diet Order: Carb Control Meal Completion: <50%   Intake/Output Summary (Last 24 hours) at 07/28/13 1015 Last data filed at 07/28/13 0900  Gross per 24 hour  Intake    680 ml  Output   4350 ml  Net  -3670 ml    Last BM: 6/1   Labs:   Recent Labs Lab 07/26/13 0445 07/27/13 0430 07/28/13 0600  NA 138 138 140  K 3.3* 3.6* 3.7  CL 102 104 104  CO2 31 30 32  BUN 31* 23 17  CREATININE 1.16 0.70 0.54  CALCIUM 7.1* 7.1* 8.0*  GLUCOSE 165* 172* 132*    CBG (last 3)   Recent Labs  07/27/13 1712 07/27/13 2212 07/28/13 0759  GLUCAP 161* 124* 115*    Scheduled Meds: . albumin human  25 g Intravenous Q8H  . ferrous sulfate  300 mg Oral Q breakfast  . folic acid  1 mg Oral Daily   . furosemide  40 mg Intravenous Q8H  . insulin aspart  0-15 Units Subcutaneous TID WC  . lactulose  40 g Oral BID  . levETIRAcetam  1,000 mg Oral BID  . levothyroxine  75 mcg Oral QAC breakfast  . multivitamin with minerals  1 tablet Oral q morning - 10a  . pantoprazole  40 mg Oral Daily  . PARoxetine  20 mg Oral Daily  . potassium chloride  40 mEq Oral TID  . rifaximin  550 mg Oral BID  . thiamine  100 mg Oral Daily    Continuous Infusions: . sodium chloride 10 mL/hr at 07/27/13 [place], [place] Pager [phone] After Hours Pager

## 2013-07-27 NOTE — Progress Notes (Signed)
Occupational Therapy Evaluation Patient Details Name: Tony Boyd MRN: [id number] DOB: 03/28/1955 Today's Date: 07/27/2013    History of Present Illness 58 yo former smoker with history of ETOH / Hepatitis liver disease fell at home resulting in L intertrochanteric fx and pulmonary contusion.  Had Nailing of hip on 5/29 associated with significant blood loss, and remained on vent post-op. extubated 5/30.    Clinical Impression   PTA, pt was mod I with mobility and ADL and lived in apt. Pt with significant functional decline, requiring Max A +2 with mobility and LB ADL. Significant altered mental status.  Pt will need rehab at SNF. Pt will benefit from skilled OT services to facilitate D/C to next venue due to below deficits.    Follow Up Recommendations  SNF;Supervision/Assistance - 24 hour    Equipment Recommendations  3 in 1 bedside comode (wide)    Recommendations for Other Services       Precautions / Restrictions Precautions Precautions: Fall Precaution Comments: pt edematous everywhere, bilat UEs/LEs and abdomen Restrictions Weight Bearing Restrictions: Yes LLE Weight Bearing: Weight bearing as tolerated      Mobility Bed Mobility Overal bed mobility: Needs Assistance Bed Mobility: Supine to Sit     Supine to sit: Max assist;+2 for physical assistance     General bed mobility comments: maxA for trunk elevation and bilat LE management  Transfers Overall transfer level: Needs assistance Equipment used: Rolling walker (2 wheeled) Transfers: Sit to/from Stand Sit to Stand: Max assist;+2 physical assistance Stand pivot transfers: Min assist;+2 physical assistance       General transfer comment: pt able to take 6 steps to chair. pt able to clear R LE 3/6 steps this date. Pt able to lift L LE off ground as well. pt dependent on RW    Balance Overall balance assessment: Needs assistance Sitting-balance support: Feet unsupported Sitting balance-Leahy Scale:  Poor Sitting balance - Comments: pt with increased abd swelling limiting hip flexion   Standing balance support: During functional activity;Bilateral upper extremity supported Standing balance-Leahy Scale: Poor Standing balance comment: requires use of RW                            ADL Overall ADL's : Needs assistance/impaired     Grooming: Minimal assistance;Sitting;Brushing hair   Upper Body Bathing: Moderate assistance   Lower Body Bathing: Maximal assistance;+2 for physical assistance;Sit to/from stand   Upper Body Dressing : Moderate assistance;Sitting   Lower Body Dressing: Maximal assistance;+2 for physical assistance;Sit to/from stand       Toileting- Water quality scientist and Hygiene: Total assistance;+2 for physical assistance;Sit to/from stand Toileting - Clothing Manipulation Details (indicate cue type and reason): incontinent of BM during session     Functional mobility during ADLs: Maximal assistance;+2 for physical assistance General ADL Comments: Limited further by increased edema     Vision                     Perception     Praxis      Pertinent Vitals/Pain VSS     Hand Dominance Right   Extremity/Trunk Assessment Upper Extremity Assessment Upper Extremity Assessment: Generalized weakness (difficulty with shoulder ROM due to general edema)   Lower Extremity Assessment Lower Extremity Assessment: Defer to PT evaluation   Cervical / Trunk Assessment Cervical / Trunk Assessment: Normal   Communication Communication Communication: No difficulties   Cognition Arousal/Alertness: Awake/alert Behavior During Therapy: WFL for tasks  assessed/performed Overall Cognitive Status: Impaired/Different from baseline Area of Impairment: Orientation;Attention;Memory;Following commands;Safety/judgement;Awareness;Problem solving Orientation Level: Place;Time;Situation Current Attention Level: Selective (internally distracted) Memory:  Decreased recall of precautions;Decreased short-term memory Following Commands: Follows one step commands with increased time Safety/Judgement: Decreased awareness of safety;Decreased awareness of deficits Awareness: Intellectual Problem Solving: Slow processing;Difficulty sequencing;Requires verbal cues;Requires tactile cues General Comments: Pt confused. Confabulating.    General Comments       Exercises       Shoulder Instructions      Home Living Family/patient expects to be discharged to:: Skilled nursing facility                                        Prior Functioning/Environment Level of Independence: Independent with assistive device(s)        Comments: uses 4 wheeled walker for ambulation    OT Diagnosis: Generalized weakness;Cognitive deficits;Acute pain;Altered mental status   OT Problem List: Decreased strength;Decreased range of motion;Decreased activity tolerance;Impaired balance (sitting and/or standing);Decreased cognition;Decreased safety awareness;Decreased knowledge of use of DME or AE;Decreased knowledge of precautions;Cardiopulmonary status limiting activity;Obesity;Pain;Increased edema   OT Treatment/Interventions: Self-care/ADL training;Therapeutic exercise;Energy conservation;DME and/or AE instruction;Therapeutic activities;Cognitive remediation/compensation;Patient/family education;Balance training    OT Goals(Current goals can be found in the care plan section) Acute Rehab OT Goals Patient Stated Goal: home OT Goal Formulation: Patient unable to participate in goal setting Time For Goal Achievement: 08/10/13 Potential to Achieve Goals: Good  OT Frequency: Min 2X/week   Barriers to D/C: Decreased caregiver support          Co-evaluation              End of Session Equipment Utilized During Treatment: Gait belt;Rolling walker Nurse Communication: Mobility status;Need for lift equipment  Activity Tolerance: Patient  tolerated treatment well Patient left: in chair;with call bell/phone within reach;with nursing/sitter in room   Time: 1206-1228 OT Time Calculation (min): 22 min Charges:  OT General Charges $OT Visit: 1 Procedure OT Evaluation $Initial OT Evaluation Tier I: 1 Procedure OT Treatments $Self Care/Home Management : 8-22 mins G-Codes:    Roney Jaffe Rooney Swails Aug 20, 2013, 2:01 PM   Maurie Boettcher, OTR/L  [phone] 2013-08-20

## 2013-07-27 NOTE — Progress Notes (Signed)
   Subjective:  Patient reports pain as moderate.  Slightly confused this morning.  Objective:   VITALS:   Filed Vitals:   07/27/13 0630 07/27/13 0700 07/27/13 0715 07/27/13 0730  BP: 103/53 88/44 114/56 121/89  Pulse: 86 94 97   Temp:      TempSrc:      Resp: 17 20 17 21   Height:      Weight:      SpO2: 100% 92% 85%     Neurologically intact Neurovascular intact Sensation intact distally Intact pulses distally Dorsiflexion/Plantar flexion intact Incision: dressing C/D/I and scant drainage No cellulitis present Compartment soft   Lab Results  Component Value Date   WBC 3.9* 07/27/2013   HGB 7.6* 07/27/2013   HCT 22.8* 07/27/2013   MCV 100.0 07/27/2013   PLT 30* 07/27/2013     Assessment/Plan:  3 Days Post-Op   - Expected postop acute blood loss anemia - will monitor for symptoms - Up with PT/OT - SNF - DVT ppx - SCDs, ambulation, holding chemoprophylaxis due to thrombocytopenia, baseline increased INR and liver disease, high risk of bleeding - WBAT left lower extremity - Pain control  Problem List Items Addressed This Visit     Cardiovascular and Mediastinum   Hypotension     Respiratory   Respiratory failure     Digestive   Cirrhosis (Chronic)     Endocrine   Hypothyroidism   Relevant Medications      levothyroxine (SYNTHROID, LEVOTHROID) tablet 75 mcg   Diabetes mellitus type 2, uncontrolled, with complications   Relevant Medications      insulin aspart (novoLOG) injection 0-15 Units     Nervous and Auditory   Seizure disorder (Chronic)   Hepatic encephalopathy   Encephalopathy acute     Musculoskeletal and Integument   Fracture, intertrochanteric, left femur     Hematopoietic and Hemostatic   Thrombocytopenia, acquired     Other   ETOH abuse (Chronic)   Anemia of chronic disease   Relevant Medications      ferrous sulfate 300 (60 FE) MG/5ML syrup 016 mg      folic acid (FOLVITE) tablet 1 mg   Altered mental status   Fall   Ascites      Other Visit Diagnoses   Intertrochanteric fracture    -  Primary    Pulmonary contusion        End-stage liver disease            [place] Eduard Roux 07/27/2013, 7:50 AM [phone]

## 2013-07-27 NOTE — Progress Notes (Signed)
Physical Therapy Treatment Patient Details Name: Tony Boyd MRN: [id number] DOB: [birth year] Today's Date: 07/27/2013    History of Present Illness 58 yo former smoker with history of ETOH / Hepatitis liver disease fell at home resulting in L intertrochanteric fx and pulmonary contusion.  Had Nailing of hip on 5/29 associated with significant blood loss, and remained on vent post-op.    PT Comments    Pt confused this date compared to yesterday. Pt with increased edema x 4 extremities and abdomen. Pt was able to tolerate stand pvt to chair this date. Pt remains appropriate for ST-SNF upon d/c to maximize functional recovery.   Follow Up Recommendations  SNF;Supervision/Assistance - 24 hour     Equipment Recommendations  None recommended by PT    Recommendations for Other Services       Precautions / Restrictions Precautions Precautions: Fall Precaution Comments: pt edematous everywhere, bilat UEs/LEs and abdomen Restrictions Weight Bearing Restrictions: Yes LLE Weight Bearing: Weight bearing as tolerated    Mobility  Bed Mobility Overal bed mobility: Needs Assistance Bed Mobility: Supine to Sit     Supine to sit: Max assist;+2 for physical assistance     General bed mobility comments: maxA for trunk elevation and bilat LE management  Transfers Overall transfer level: Needs assistance Equipment used: Rolling walker (2 wheeled) Transfers: Sit to/from [facility] Sit to Stand: Max assist;+2 physical assistance Stand pivot transfers: Min assist;+2 physical assistance       General transfer comment: pt able to take 6 steps to chair. pt able to clear R LE 3/6 steps this date. Pt able to lift L LE off ground as well. pt dependent on RW  Ambulation/Gait                 Stairs            Wheelchair Mobility    Modified Rankin (Stroke Patients Only)       Balance Overall balance assessment: Needs assistance Sitting-balance support:  Feet supported;Bilateral upper extremity supported Sitting balance-Leahy Scale: Poor Sitting balance - Comments: pt with increased abd swelling limiting hip flexion       Standing balance comment: requires use of RW                    Cognition Arousal/Alertness: Awake/alert Behavior During Therapy: WFL for tasks assessed/performed Overall Cognitive Status: Impaired/Different from baseline Area of Impairment: Orientation;Problem solving;Memory;Attention Orientation Level: Disoriented to;Place;Time Current Attention Level: Selective Memory: Decreased short-term memory       Problem Solving: Slow processing;Requires verbal cues;Requires tactile cues General Comments: pt mildly confused. pt aware he broke his L hip however unaware he was in hospital. Pt report that him and his 57 yo wife are 4 months pregnant    Exercises General Exercises - Lower Extremity Ankle Circles/Pumps: AROM;Both;10 reps;Seated Long Arc Quad: AROM;Both;10 reps;Seated    General Comments        Pertinent Vitals/Pain L hip pain but didn't rate    Home Living                      Prior Function            PT Goals (current goals can now be found in the care plan section) Progress towards PT goals: Progressing toward goals    Frequency  Min 4X/week    PT Plan Current plan remains appropriate    Co-evaluation  End of Session Equipment Utilized During Treatment: Gait belt Activity Tolerance: Patient limited by fatigue Patient left: in chair;with call bell/phone within reach     Time: 1117-1145 PT Time Calculation (min): 28 min  Charges:  $Gait Training: 8-22 mins $Therapeutic Exercise: 8-22 mins                    G Codes:      Nurah Petrides M Blakelyn Dinges July 30, 2013, 12:26 PM  [name], PT, DPT Pager #: [phone] Office #: [phone]

## 2013-07-27 NOTE — Clinical Social Work Placement (Addendum)
Clinical Social Work Department CLINICAL SOCIAL WORK PLACEMENT NOTE 07/27/2013  Patient:  Tony Boyd, Tony Boyd  Account Number:  [id number] Admit date:  07/23/2013  Clinical Social Worker:  Barbette Or, LCSW  Date/time:  07/27/2013 11:45 AM  Clinical Social Work is seeking post-discharge placement for this patient at the following level of care:   SKILLED NURSING   (*CSW will update this form in Epic as items are completed)   07/27/2013  Patient/family provided with [facility] Department of Clinical Social Work's list of facilities offering this level of care within the geographic area requested by the patient (or if unable, by the patient's family).  07/27/2013  Patient/family informed of their freedom to choose among providers that offer the needed level of care, that participate in Medicare, Medicaid or managed care program needed by the patient, have an available bed and are willing to accept the patient.  07/27/2013  Patient/family informed of [facility]' ownership interest in [facility], as well as of the fact that they are under no obligation to receive care at this facility.  PASARR submitted to EDS on 07/27/13 PASARR number received from [employer]DS on 07/29/13 - ([id number] E: effective 6/3 through 7/3)   FL2 transmitted to all facilities in geographic area requested by pt/family on  07/27/2013 FL2 transmitted to all facilities within larger geographic area on   Patient informed that his/her managed care company has contracts with or will negotiate with  certain facilities, including the following:    Patient/family informed of bed offers received: 08/07/13  Patient chooses bed at  [facility] Physician recommends and patient chooses bed at    Patient to be transferred to [facility] on 08/07/13   Patient to be transferred to facility by ambulance  The following physician request were entered in Epic:  Additional Comments: 07/27/13:  Patient with [facility] - will need to review  expiration date prior to discharge when medically ready

## 2013-07-27 NOTE — ED Provider Notes (Signed)
Medical screening examination/treatment/procedure(s) were conducted as a shared visit with non-physician practitioner(s) and myself.  I personally evaluated the patient during the encounter.   EKG Interpretation   Date/Time:  Thursday Jul 23 2013 15:23:34 EDT Ventricular Rate:  73 PR Interval:  191 QRS Duration: 94 QT Interval:  475 QTC Calculation: 523 R Axis:   -36 Text Interpretation:  Sinus rhythm Low voltage, extremity and precordial  leads  Present 06-30-2013 Slow R progression-Noted 06-30-2013 Prolonged QT  interval Confirmed by Jeneen Rinks  MD, [place] ([id number]) on 07/23/2013 3:54:31 PM      Patient seen and evaluated face-to-face. Please see my additional dictation.  Tanna Furry, MD 07/27/13 [phone]

## 2013-07-28 ENCOUNTER — Inpatient Hospital Stay (HOSPITAL_COMMUNITY): Payer: Medicare Other

## 2013-07-28 LAB — GLUCOSE, CAPILLARY
Glucose-Capillary: 115 mg/dL — ABNORMAL HIGH (ref 70–99)
Glucose-Capillary: 150 mg/dL — ABNORMAL HIGH (ref 70–99)
Glucose-Capillary: 203 mg/dL — ABNORMAL HIGH (ref 70–99)
Glucose-Capillary: 181 mg/dL — ABNORMAL HIGH (ref 70–99)

## 2013-07-28 LAB — COMPREHENSIVE METABOLIC PANEL
ALT: 14 U/L (ref 0–53)
AST: 37 U/L (ref 0–37)
Albumin: 2.2 g/dL — ABNORMAL LOW (ref 3.5–5.2)
Alkaline Phosphatase: 121 U/L — ABNORMAL HIGH (ref 39–117)
BUN: 17 mg/dL (ref 6–23)
CO2: 32 mEq/L (ref 19–32)
Calcium: 8 mg/dL — ABNORMAL LOW (ref 8.4–10.5)
Chloride: 104 mEq/L (ref 96–112)
Creatinine, Ser: 0.54 mg/dL (ref 0.50–1.35)
GFR calc Af Amer: >90 mL/min (ref >90)
GFR calc non Af Amer: >90 mL/min (ref >90)
Glucose, Bld: 132 mg/dL — ABNORMAL HIGH (ref 70–99)
Potassium: 3.7 mEq/L (ref 3.7–5.3)
Sodium: 140 mEq/L (ref 137–147)
TOTAL PROTEIN: 5.2 g/dL — AB (ref 6.0–8.3)
Total Bilirubin: 5.8 mg/dL — ABNORMAL HIGH (ref 0.3–1.2)

## 2013-07-28 LAB — CBC
HEMATOCRIT: 22.3 % — AB (ref 39.0–52.0)
Hemoglobin: 7.4 g/dL — ABNORMAL LOW (ref 13.0–17.0)
MCH: 33.9 pg (ref 26.0–34.0)
MCHC: 33.2 g/dL (ref 30.0–36.0)
MCV: 102.3 fL — ABNORMAL HIGH (ref 78.0–100.0)
Platelets: 30 10*3/uL — ABNORMAL LOW (ref 150–400)
RBC: 2.18 MIL/uL — ABNORMAL LOW (ref 4.22–5.81)
RDW: 20.8 % — AB (ref 11.5–15.5)
WBC: 2.4 10*3/uL — ABNORMAL LOW (ref 4.0–10.5)

## 2013-07-28 LAB — PREPARE FRESH FROZEN PLASMA: Unit division: 0

## 2013-07-28 MED ORDER — ALBUMIN HUMAN 25 % IV SOLN
25.0000 g | Freq: Three times a day (TID) | INTRAVENOUS | Status: AC
  Administered 2013-07-28 (×2): 25 g via INTRAVENOUS
  Filled 2013-07-28 (×2): qty 100

## 2013-07-28 MED ORDER — ENSURE COMPLETE PO LIQD
237.0000 mL | Freq: Two times a day (BID) | ORAL | Status: DC
  Administered 2013-07-28 – 2013-08-07 (×16): 237 mL via ORAL

## 2013-07-28 MED ORDER — POTASSIUM CHLORIDE CRYS ER 20 MEQ PO TBCR
40.0000 meq | EXTENDED_RELEASE_TABLET | Freq: Three times a day (TID) | ORAL | Status: AC
  Administered 2013-07-28 (×2): 40 meq via ORAL
  Filled 2013-07-28 (×2): qty 2

## 2013-07-28 MED ORDER — FUROSEMIDE 10 MG/ML IJ SOLN
40.0000 mg | Freq: Three times a day (TID) | INTRAMUSCULAR | Status: AC
  Administered 2013-07-28 (×2): 40 mg via INTRAVENOUS
  Filled 2013-07-28 (×2): qty 4

## 2013-07-28 NOTE — Progress Notes (Signed)
Subjective:  Patient reports pain as improved.  C/o heaviness of leg.  Objective:   VITALS:   Filed Vitals:   07/28/13 0200 07/28/13 0321 07/28/13 0400 07/28/13 0600  BP: 108/58  110/57 126/66  Pulse: 80  79 92  Temp:  98.1 F (36.7 C)    TempSrc:  Oral    Resp: 13  15 24   Height:      Weight:      SpO2: 96%  92% 91%    Neurologically intact Neurovascular intact Sensation intact distally Intact pulses distally Dorsiflexion/Plantar flexion intact Incision: dressing C/D/I and scant drainage No cellulitis present Compartment soft Incision c/d/i Generalized swelling and weeping 2/2 anasarca   Lab Results  Component Value Date   WBC 2.4* 07/28/2013   HGB 7.4* 07/28/2013   HCT 22.3* 07/28/2013   MCV 102.3* 07/28/2013   PLT 30* 07/28/2013     Assessment/Plan:  4 Days Post-Op   - Expected postop acute blood loss anemia - will monitor for symptoms - Up with PT/OT - SNF - DVT ppx - SCDs, ambulation, holding chemoprophylaxis due to thrombocytopenia, baseline increased INR and liver disease, high risk of bleeding - WBAT left lower extremity - Pain control - dressings changed today  Problem List Items Addressed This Visit     Cardiovascular and Mediastinum   Hypotension   Relevant Medications      furosemide (LASIX) injection 40 mg (Completed)     Respiratory   Respiratory failure     Digestive   Cirrhosis (Chronic)     Endocrine   Hypothyroidism   Relevant Medications      levothyroxine (SYNTHROID, LEVOTHROID) tablet 75 mcg   Diabetes mellitus type 2, uncontrolled, with complications   Relevant Medications      insulin aspart (novoLOG) injection 0-15 Units     Nervous and Auditory   Seizure disorder (Chronic)   Hepatic encephalopathy   Encephalopathy acute     Musculoskeletal and Integument   Fracture, intertrochanteric, left femur     Hematopoietic and Hemostatic   Thrombocytopenia, acquired     Other   ETOH abuse (Chronic)   Anasarca   Anemia of  chronic disease   Relevant Medications      ferrous sulfate 300 (60 FE) MG/5ML syrup 809 mg      folic acid (FOLVITE) tablet 1 mg   Altered mental status   Fall   Ascites    Other Visit Diagnoses   Intertrochanteric fracture    -  Primary    Pulmonary contusion        End-stage liver disease            Tony Boyd Eduard Roux 07/28/2013, 7:28 AM [phone]

## 2013-07-28 NOTE — Progress Notes (Signed)
PULMONARY / CRITICAL CARE MEDICINE   Name: Tony Boyd MRN: [id number] DOB: January 10, 1956    ADMISSION DATE:  07/23/2013 CONSULTATION DATE:  07/24/2013  REFERRING MD :  Leisa Lenz  CHIEF COMPLAINT:  [address] down  BRIEF PATIENT DESCRIPTION: 58 yo former smoker with history of ETOH / Hepatitis liver disease fell at home resulting in L intertrochanteric fx and pulmonary contusion.  Had Nailing of hip on 5/29 associated with significant blood loss, and remained on vent post-op.  PCCM assumed care in ICU.  SIGNIFICANT EVENTS: 5/28 Admit, ortho consulted 5/29 Nailing of Lt intertrochanteric fx 5/30 Transfuse 1 unit PRBC 5/31  Add levophed, transfuse 2 units PRBC 6/01 Off vasopressors. Poorly oriented but no distress. SDU transfer ordered 6/02 Off pressors, improved mental status.  STUDIES:  CT head 5/28 >> no acute process Pelvis xray 5/28 >> Left femoral intertrochanteric fracture  LINES / TUBES: ETT 5/29 >>> 5/30 R R AL 5/29 >>> 5/30 R IJ double lumen CVL 5/29 >>   CULTURES: 5/29  MRSA PCR  >>> POSITIVE  ANTIBIOTICS: Clindamycin 5/29 >>> 5/30  SUBJECTIVE:  Oriented and interactive.  VITAL SIGNS: Temp:  [97.7 F (36.5 C)-98.1 F (36.7 C)] 97.9 F (36.6 C) (06/02 0800) Pulse Rate:  [46-92] 92 (06/02 0600) Resp:  [13-30] 24 (06/02 0600) BP: (106-127)/(42-78) 126/66 mmHg (06/02 0600) SpO2:  [89 %-100 %] 91 % (06/02 0600)  HEMODYNAMICS: CVP:  [28 mmHg-29 mmHg] 29 mmHg  INTAKE / OUTPUT: Intake/Output     06/01 0701 - 06/02 0700 06/02 0701 - 06/03 0700   P.O. 480    I.V. (mL/kg) 350 (2.5)    Blood     Total Intake(mL/kg) 830 (6)    Urine (mL/kg/hr) 4325 (1.3)    Total Output 4325     Net -3495          Stool Occurrence 4 x     PHYSICAL EXAMINATION: General: no distress, severe anasarca Neuro: no focal deficits HEENT: WNL Cardiovascular: RRR s M Lungs: Clear anteriorly Abdomen: mild distension, soft, pitting edema on abdomen Ext: 4+ symmetric pitting  edema Skin: chronic stasis changes in BLEs  LABS:  CBC  Recent Labs Lab 07/26/13 1235 07/27/13 0430 07/28/13 0600  WBC 7.2 3.9* 2.4*  HGB 8.5* 7.6* 7.4*  HCT 25.0* 22.8* 22.3*  PLT 37* 30* 30*   Coag's  Recent Labs Lab 07/25/13 0451 07/25/13 1538 07/26/13 0445  INR 2.45* 2.91* 2.00*   BMET  Recent Labs Lab 07/26/13 0445 07/27/13 0430 07/28/13 0600  NA 138 138 140  K 3.3* 3.6* 3.7  CL 102 104 104  CO2 31 30 32  BUN 31* 23 17  CREATININE 1.16 0.70 0.54  GLUCOSE 165* 172* 132*   Electrolytes  Recent Labs Lab 07/26/13 0445 07/27/13 0430 07/28/13 0600  CALCIUM 7.1* 7.1* 8.0*   ABG  Recent Labs Lab 07/24/13 1732 07/24/13 1808  PHART 7.472* 7.438  PCO2ART 46.6* 49.0*  PO2ART 120.0* 135.0*   Liver Enzymes  Recent Labs Lab 07/23/13 1535 07/28/13 0600  AST 43* 37  ALT 16 14  ALKPHOS 168* 121*  BILITOT 5.5* 5.8*  ALBUMIN 1.9* 2.2*   Glucose  Recent Labs Lab 07/26/13 1634 07/26/13 2153 07/27/13 0839 07/27/13 1229 07/27/13 1712 07/27/13 2212  GLUCAP 177* 180* 163* 160* 161* 124*   CXR: NNF  ASSESSMENT / PLAN:  PULMONARY A: LLL Atx and/or effusion Acute resp failure, resolved. Hx of smoking. P:   - Cont supp O2 as needed to maintain SpO2 >  92% - Cont PRN nebs - IS per RT.  CARDIOVASCULAR A:  Hypotension, resolved P:  - Continue holding Nadolol specially with active diureses today.  RENAL A:   AKI, resolved Anasarca, severe Hypoalbuminemia due to cirrhosis P:   - Lasix with albumin X 2 reordered 6/02. - Monitor BMET intermittently. - Monitor I/Os. - Correct electrolytes as indicated.  GASTROINTESTINAL A:   ETOH / Hepatitis related cirrhosis. H/O GERD, chronic PPI use P:   - SUP: PPI. - Cont diet as ordered.  HEMATOLOGIC A:   Anemia of chronic disease Acute blood loss anemia due to operative bleeding. Severe thrombocytopenia,  Hepatic coagulopathy  P:  - DVT px: SCDs. - Monitor CBC intermittently. -  Transfuse per usual ICU guidelines. - Cont Fe SO4.  INFECTIOUS A:   No acute issues P:   - Monitor clinically  ENDOCRINE A:   DM II Hypothyroidism   P:   - Cont SSI - Holding Lantus - Continue Levothyroxine  NEUROLOGIC A:   H/O EtOH abuse with ongoing use up to time of admission Chronic hepatic encephalopath Bipolar disorder H/O seizures Post op pain P:   - Continue Lactolose. - Cont Rifaximin. - Continue Keppra. - Continue Paxil, Risperdal. - Cont Thiamine / Folate. - Cont Fentanyl.  ORTHOPEDICS A: Lt intertrochanteric fracture s/p nailing 5/29. P: - Post-op care per orthopedics.  Transfer to SDU ordered 6/02 and to [facility] service, PCCM will sign off, please call back if needed.  Rush Farmer, M.D. [facility] Pulmonary/Critical Care Medicine. Pager: [phone]. After hours pager: [phone].

## 2013-07-28 NOTE — Care Management Note (Signed)
CARE MANAGEMENT NOTE 07/28/2013  Patient:  Tony Boyd, Tony Boyd   Account Number:  [id number]  Date Initiated:  07/28/2013  Documentation initiated by:  Jasmine Pang  Subjective/Objective Assessment:   Pt transferred to [place] on 07/28/13, noted referral for [facility] and DME needs.     Action/Plan:   07/28/2013 CM to follow for progression and d/c planning. Will follow for progression and recommendations from PT/OT.   Anticipated DC Date:  07/31/2013   Anticipated DC Plan:  SKILLED NURSING FACILITY         Choice offered to / List presented to:             Status of service:  In process, will continue to follow Medicare Important Message given?   (If response is "NO", the following Medicare IM given date fields will be blank) Date Medicare IM given:   Date Additional Medicare IM given:    Discharge Disposition:    Per UR Regulation:    If discussed at Long Length of Stay Meetings, dates discussed:    Comments:

## 2013-07-28 NOTE — Progress Notes (Signed)
Physical Therapy Treatment Patient Details Name: Tony Boyd MRN: [id number] DOB: 1955-12-26 Today's Date: 07/28/2013    History of Present Illness 58 yo former smoker with history of ETOH / Hepatitis liver disease fell at home resulting in L intertrochanteric fx and pulmonary contusion.  Had Nailing of hip on 5/29 associated with significant blood loss, and remained on vent post-op.    PT Comments    Patient demonstrates continued confusion today. Was able to tolerate multiple transfers as well as pivotal steps to commode and to chair. Will continue to progress activity as tolerated.  Follow Up Recommendations  SNF;Supervision/Assistance - 24 hour     Equipment Recommendations  None recommended by PT    Recommendations for Other Services       Precautions / Restrictions Precautions Precautions: Fall Precaution Comments: pt edematous everywhere, bilat UEs/LEs and abdomen Restrictions Weight Bearing Restrictions: Yes LLE Weight Bearing: Weight bearing as tolerated    Mobility  Bed Mobility Overal bed mobility: Needs Assistance Bed Mobility: Supine to Sit     Supine to sit: Max assist;+2 for physical assistance     General bed mobility comments: maxA for trunk elevation and bilat LE management  Transfers Overall transfer level: Needs assistance Equipment used: Rolling walker (2 wheeled) Transfers: Sit to/from [facility] Sit to Stand: Max assist;+2 physical assistance Stand pivot transfers: Min assist;+2 physical assistance       General transfer comment: Patient able to stand with increased assist, took 3 steps with manual assist to initiate weight shift and steps, then had to sit and use the [facility], patient then assisted to standing and performed pivotal steps to chair with increased assist and manual weight shift and placement of LEs. Poor ability to follow commands on cue.  Ambulation/Gait                 Stairs            Wheelchair  Mobility    Modified Rankin (Stroke Patients Only)       Balance     Sitting balance-Leahy Scale: Poor Sitting balance - Comments: pt with increased abd swelling limiting hip flexion                            Cognition Arousal/Alertness: Awake/alert Behavior During Therapy: WFL for tasks assessed/performed Overall Cognitive Status: Impaired/Different from baseline Area of Impairment: Orientation;Problem solving;Memory;Attention Orientation Level: Disoriented to;Place;Time Current Attention Level: Selective Memory: Decreased short-term memory       Problem Solving: Slow processing;Requires verbal cues;Requires tactile cues General Comments: pt continues to present with confusion and confabulation during session.    Exercises General Exercises - Lower Extremity Ankle Circles/Pumps: AROM;Both;10 reps;Supine    General Comments General comments (skin integrity, edema, etc.): Pt with +BM during session, performed multiple transfers from bed, to commmode to chair with increased assist.       Pertinent Vitals/Pain VSS,  SpO2 90 on room air upon entering, returned to 2 Liters post activity,     Home Living                      Prior Function            PT Goals (current goals can now be found in the care plan section) Acute Rehab PT Goals Patient Stated Goal: to go home PT Goal Formulation: With patient Time For Goal Achievement: 08/02/13 Potential to Achieve Goals: Good Progress towards PT  goals: Progressing toward goals    Frequency  Min 4X/week    PT Plan Current plan remains appropriate    Co-evaluation             End of Session Equipment Utilized During Treatment: Gait belt Activity Tolerance: Patient limited by fatigue Patient left: in chair;with call bell/phone within reach     Time: 0929-0953 PT Time Calculation (min): 24 min  Charges:  $Gait Training: 8-22 mins $Therapeutic Activity: 8-22 mins                    G  Codes:      Duncan Dull 2013/08/01, 11:19 AM Alben Deeds, PT DPT  [phone]

## 2013-07-29 DIAGNOSIS — F411 Generalized anxiety disorder: Secondary | ICD-10-CM

## 2013-07-29 LAB — CBC
HCT: 25.7 % — ABNORMAL LOW (ref 39.0–52.0)
Hemoglobin: 8.6 g/dL — ABNORMAL LOW (ref 13.0–17.0)
MCH: 34.7 pg — ABNORMAL HIGH (ref 26.0–34.0)
MCHC: 33.5 g/dL (ref 30.0–36.0)
MCV: 103.6 fL — ABNORMAL HIGH (ref 78.0–100.0)
Platelets: 35 10*3/uL — ABNORMAL LOW (ref 150–400)
RBC: 2.48 MIL/uL — ABNORMAL LOW (ref 4.22–5.81)
RDW: 20.7 % — AB (ref 11.5–15.5)
WBC: 2.9 10*3/uL — ABNORMAL LOW (ref 4.0–10.5)

## 2013-07-29 LAB — PHOSPHORUS: PHOSPHORUS: 1.7 mg/dL — AB (ref 2.3–4.6)

## 2013-07-29 LAB — GLUCOSE, CAPILLARY
GLUCOSE-CAPILLARY: 124 mg/dL — AB (ref 70–99)
GLUCOSE-CAPILLARY: 189 mg/dL — AB (ref 70–99)
GLUCOSE-CAPILLARY: 157 mg/dL — AB (ref 70–99)
Glucose-Capillary: 164 mg/dL — ABNORMAL HIGH (ref 70–99)

## 2013-07-29 LAB — BASIC METABOLIC PANEL
BUN: 12 mg/dL (ref 6–23)
CALCIUM: 8.5 mg/dL (ref 8.4–10.5)
CO2: 31 meq/L (ref 19–32)
Chloride: 104 mEq/L (ref 96–112)
Creatinine, Ser: 0.5 mg/dL (ref 0.50–1.35)
GFR calc Af Amer: >90 mL/min (ref >90)
GFR calc non Af Amer: >90 mL/min (ref >90)
Glucose, Bld: 148 mg/dL — ABNORMAL HIGH (ref 70–99)
Potassium: 3.6 mEq/L — ABNORMAL LOW (ref 3.7–5.3)
Sodium: 142 mEq/L (ref 137–147)

## 2013-07-29 LAB — MAGNESIUM: MAGNESIUM: 1.5 mg/dL (ref 1.5–2.5)

## 2013-07-29 MED ORDER — ALBUMIN HUMAN 25 % IV SOLN
25.0000 g | Freq: Three times a day (TID) | INTRAVENOUS | Status: AC
  Administered 2013-07-29 (×2): 25 g via INTRAVENOUS
  Filled 2013-07-29 (×3): qty 100

## 2013-07-29 MED ORDER — POTASSIUM CHLORIDE CRYS ER 20 MEQ PO TBCR
40.0000 meq | EXTENDED_RELEASE_TABLET | Freq: Every day | ORAL | Status: DC
  Administered 2013-07-30 – 2013-08-07 (×9): 40 meq via ORAL
  Filled 2013-07-29 (×9): qty 2

## 2013-07-29 MED ORDER — POTASSIUM CHLORIDE CRYS ER 20 MEQ PO TBCR
40.0000 meq | EXTENDED_RELEASE_TABLET | ORAL | Status: AC
  Administered 2013-07-29 (×2): 40 meq via ORAL
  Filled 2013-07-29 (×2): qty 2

## 2013-07-29 MED ORDER — OXYCODONE HCL 5 MG PO TABS: 5.0000 mg | ORAL_TABLET | ORAL | Status: DC | PRN

## 2013-07-29 MED ORDER — FUROSEMIDE 10 MG/ML IJ SOLN
40.0000 mg | Freq: Three times a day (TID) | INTRAMUSCULAR | Status: AC
  Administered 2013-07-29 (×2): 40 mg via INTRAVENOUS
  Filled 2013-07-29 (×2): qty 4

## 2013-07-29 NOTE — Progress Notes (Signed)
Subjective:  C/o heaviness of leg.  Objective:   VITALS:   Filed Vitals:   07/28/13 1928 07/28/13 2023 07/28/13 2226 07/29/13 0516  BP:  145/65  118/71  Pulse:  97  93  Temp:  99.3 F (37.4 C)  98.6 F (37 C)  TempSrc:    Oral  Resp:  20    Height:      Weight: 139.3 kg (307 lb 1.6 oz) 135.4 kg (298 lb 8.1 oz) 135.7 kg (299 lb 2.6 oz)   SpO2:  92%  97%    Neurologically intact Neurovascular intact Sensation intact distally Intact pulses distally Dorsiflexion/Plantar flexion intact Incision: dressing C/D/I and scant drainage No cellulitis present Compartment soft   Lab Results  Component Value Date   WBC 2.9* 07/29/2013   HGB 8.6* 07/29/2013   HCT 25.7* 07/29/2013   MCV 103.6* 07/29/2013   PLT 35* 07/29/2013     Assessment/Plan:  5 Days Post-Op   - Expected postop acute blood loss anemia - will monitor for symptoms - Up with PT/OT - SNF - DVT ppx - SCDs, ambulation, holding chemoprophylaxis due to thrombocytopenia, baseline increased INR and liver disease, high risk of bleeding - WBAT left lower extremity - Pain control - SNF pending  Problem List Items Addressed This Visit     Cardiovascular and Mediastinum   Hypotension   Relevant Medications      furosemide (LASIX) injection 40 mg (Completed)      furosemide (LASIX) injection 40 mg (Completed)     Respiratory   Respiratory failure     Digestive   Cirrhosis (Chronic)     Endocrine   Hypothyroidism   Relevant Medications      levothyroxine (SYNTHROID, LEVOTHROID) tablet 75 mcg   Diabetes mellitus type 2, uncontrolled, with complications   Relevant Medications      insulin aspart (novoLOG) injection 0-15 Units     Nervous and Auditory   Seizure disorder (Chronic)   Hepatic encephalopathy   Encephalopathy acute     Musculoskeletal and Integument   Fracture, intertrochanteric, left femur     Hematopoietic and Hemostatic   Thrombocytopenia, acquired     Other   ETOH abuse (Chronic)   Anasarca    Anemia of chronic disease   Relevant Medications      ferrous sulfate 300 (60 FE) MG/5ML syrup 790 mg      folic acid (FOLVITE) tablet 1 mg   Altered mental status   Fall   Ascites    Other Visit Diagnoses   Intertrochanteric fracture    -  Primary    Relevant Orders       Weight bearing as tolerated    Pulmonary contusion        End-stage liver disease            [place] Eduard Roux 07/29/2013, 9:00 AM [phone]

## 2013-07-29 NOTE — Progress Notes (Signed)
Patient was found by IV nurse in a pool of blood after he (patient) pulled his central line out. Pressure dressing done by IV nurse. Notified MD, no orders received.  Vital signs as follows: Tempt=98.6 (oral), BP=118/71, Pulse rate=93, O2 sat=97 (Room Air). Will continue to monitor patient.

## 2013-07-29 NOTE — Progress Notes (Signed)
[facility] PROGRESS NOTE  Tony Boyd [id number]:[id number] DOB: May 16, 1955 DOA: 07/23/2013 PCP: Hollace Kinnier, DO  Assessment/Plan: 1-left intertrochanteric fracture: s/p nailing by orthopedic surgery. -slowly progressing -will need SNF for rehab  2-ABLA: s/p transfusion (FFP and PRBC's) -Hgb stable currently -will follow trend and transfuse further as needed  3-Cirrhosis/ESLD -with anasarca and fluid overload -continue lasix with albumin -follow VS and volume status -continue rifaximin and lactulose  4-ETOH abuse: -counseling provided -continue CIWA, thiamine and folic acid  5-GERD:continue PPI  6-thrombocytopenia: due to cirrhosis  -will continue SCD's and avoid heparin products  7-DM type 2: -continue SSI -follow CBG's now that patient is eating better and adjust hypoglycemic regimen as needed  8-hypothyroidism: -continue synthroid  9-Hx of seizure: no seizure activity appreciated -continue keppra  10-depression/anxiety: continue risperdal and paxil  11-resp failure and hypotension: -resolved now -continue flutter valve/IS and PRN oxygen supplementation.  10-  Code Status: Full Family Communication: no family at bedside Disposition Plan: SNF when medically stable   Consultants:  Orthopedic service  PCCM  Procedures:  Left hip intertrochanteric nailing   Antibiotics:  None   HPI/Subjective: Complaining of pain on his left leg with movement; patient still with signs of fluid overload/anasarca  Objective: Filed Vitals:   07/29/13 2259  BP: 131/73  Pulse: 88  Temp: 98.8 F (37.1 C)  Resp: 16    Intake/Output Summary (Last 24 hours) at 07/29/13 2308 Last data filed at 07/29/13 1645  Gross per 24 hour  Intake    240 ml  Output   1400 ml  Net  -1160 ml   Filed Weights   07/28/13 1928 07/28/13 2023 07/28/13 2226  Weight: 139.3 kg (307 lb 1.6 oz) 135.4 kg (298 lb 8.1 oz) 135.7 kg (299 lb 2.6 oz)    Exam:   General:  AAOX3, no  fever. Complaining of pain in his leg  Cardiovascular: S1 and S2, no rubs or gallops; 3 ++ edema bilaterally  Respiratory: bibasilar crackles appreciated on exam  Abdomen: distended, non tender; positive BS  Musculoskeletal: no cyanosis; pain with movement on left lower extremity   Data Reviewed: Basic Metabolic Panel:  Recent Labs Lab 07/25/13 0451 07/26/13 0445 07/27/13 0430 07/28/13 0600 07/29/13 0655  NA 141 138 138 140 142  K 3.7 3.3* 3.6* 3.7 3.6*  CL 103 102 104 104 104  CO2 31 31 30  32 31  GLUCOSE 150* 165* 172* 132* 148*  BUN 24* 31* 23 17 12   CREATININE 1.01 1.16 0.70 0.54 0.50  CALCIUM 7.1* 7.1* 7.1* 8.0* 8.5  MG  --   --   --   --  1.5  PHOS  --   --   --   --  1.7*   Liver Function Tests:  Recent Labs Lab 07/23/13 1535 07/28/13 0600  AST 43* 37  ALT 16 14  ALKPHOS 168* 121*  BILITOT 5.5* 5.8*  PROT 5.8* 5.2*  ALBUMIN 1.9* 2.2*   No results found for this basename: LIPASE, AMYLASE,  in the last 168 hours  Recent Labs Lab 07/23/13 1639  AMMONIA 31   CBC:  Recent Labs Lab 07/23/13 1535  07/26/13 0445 07/26/13 1235 07/27/13 0430 07/28/13 0600 07/29/13 0655  WBC 8.2  < > 5.3 7.2 3.9* 2.4* 2.9*  NEUTROABS 6.9  --   --   --   --   --   --   HGB 9.9*  < > 6.7* 8.5* 7.6* 7.4* 8.6*  HCT 29.1*  < > 20.4* 25.0*  22.8* 22.3* 25.7*  MCV 105.1*  < > 101.5* 97.7 100.0 102.3* 103.6*  PLT 42*  < > 33* 37* 30* 30* 35*  < > = values in this interval not displayed. Cardiac Enzymes:  Recent Labs Lab 07/23/13 1535  CKTOTAL 238*   BNP (last 3 results)  Recent Labs  05/31/13 2330 06/22/13 1738 06/30/13 2225  PROBNP 304.0* 296.3* 669.2*   CBG:  Recent Labs Lab 07/28/13 2030 07/29/13 0750 07/29/13 1136 07/29/13 1730 07/29/13 2204  GLUCAP 181* 124* 164* 189* 157*    Recent Results (from the past 240 hour(s))  SURGICAL PCR SCREEN     Status: Abnormal   Collection Time    07/24/13 11:38 AM      Result Value Ref Range Status   MRSA,  PCR NEGATIVE  NEGATIVE Final   Staphylococcus aureus POSITIVE (*) NEGATIVE Final   Comment:            The Xpert SA Assay (FDA     approved for NASAL specimens     in patients over 16 years of age),     is one component of     a comprehensive surveillance     program.  Test performance has     been validated by [employer] for patients greater     than or equal to 77 year old.     It is not intended     to diagnose infection nor to     guide or monitor treatment.  MRSA PCR SCREENING     Status: None   Collection Time    07/24/13  3:30 PM      Result Value Ref Range Status   MRSA by PCR NEGATIVE  NEGATIVE Final   Comment:            The GeneXpert MRSA Assay (FDA     approved for NASAL specimens     only), is one component of a     comprehensive MRSA colonization     surveillance program. It is not     intended to diagnose MRSA     infection nor to guide or     monitor treatment for     MRSA infections.     Studies: Dg Chest Port 1 View  07/28/2013   CLINICAL DATA:  Respiratory failure.  EXAM: PORTABLE CHEST - 1 VIEW  COMPARISON:  Single view of the chest 07/26/2013 and 07/25/2013.  FINDINGS: Right IJ catheter remains in place, unchanged. There has been interval worsening in aeration in the right lower lung zone. Airspace disease in the left mid and lower lung zones appears unchanged. No pneumothorax identified. Heart size is normal.  IMPRESSION: Worsened airspace disease in the right lower lung zone with no change on the left identified. Findings are compatible multifocal pneumonia and/or pulmonary edema.   Electronically Signed   By: Inge Rise M.D.   On: 07/28/2013 07:22    Scheduled Meds: . albumin human  25 g Intravenous Q8H  . feeding supplement (ENSURE COMPLETE)  237 mL Oral BID BM  . ferrous sulfate  300 mg Oral Q breakfast  . folic acid  1 mg Oral Daily  . insulin aspart  0-15 Units Subcutaneous TID WC  . lactulose  40 g Oral BID  . levETIRAcetam  1,000 mg  Oral BID  . levothyroxine  75 mcg Oral QAC breakfast  . multivitamin with minerals  1 tablet Oral q morning - 10a  .  pantoprazole  40 mg Oral Daily  . PARoxetine  20 mg Oral Daily  . rifaximin  550 mg Oral BID  . thiamine  100 mg Oral Daily   Continuous Infusions: . sodium chloride 10 mL/hr at 07/27/13 2000    Active Problems:   ETOH abuse   Hepatitis C   Cirrhosis   Seizure disorder   Hypothyroidism   Diabetes mellitus type 2, uncontrolled, with complications   Ascites   Chronic back pain   Fracture, intertrochanteric, left femur   Respiratory failure   Hypotension   Encephalopathy acute    Time spent: >30 minutes    [place] [facility] Pager [phone]. If 7PM-7AM, please contact night-coverage at [email or web address], password [facility] 07/29/2013, 11:08 PM  LOS: 6 days

## 2013-07-30 ENCOUNTER — Inpatient Hospital Stay (HOSPITAL_COMMUNITY): Payer: Medicare Other

## 2013-07-30 DIAGNOSIS — M549 Dorsalgia, unspecified: Secondary | ICD-10-CM

## 2013-07-30 DIAGNOSIS — S72143A Displaced intertrochanteric fracture of unspecified femur, initial encounter for closed fracture: Secondary | ICD-10-CM

## 2013-07-30 DIAGNOSIS — G8929 Other chronic pain: Secondary | ICD-10-CM

## 2013-07-30 DIAGNOSIS — W19XXXA Unspecified fall, initial encounter: Secondary | ICD-10-CM

## 2013-07-30 LAB — CBC
HCT: 22.6 % — ABNORMAL LOW (ref 39.0–52.0)
Hemoglobin: 7.4 g/dL — ABNORMAL LOW (ref 13.0–17.0)
MCH: 34.1 pg — ABNORMAL HIGH (ref 26.0–34.0)
MCHC: 32.7 g/dL (ref 30.0–36.0)
MCV: 104.1 fL — ABNORMAL HIGH (ref 78.0–100.0)
PLATELETS: 8 10*3/uL — AB (ref 150–400)
RBC: 2.17 MIL/uL — AB (ref 4.22–5.81)
RDW: 21.3 % — ABNORMAL HIGH (ref 11.5–15.5)
WBC: 2.3 10*3/uL — ABNORMAL LOW (ref 4.0–10.5)

## 2013-07-30 LAB — BASIC METABOLIC PANEL
BUN: 8 mg/dL (ref 6–23)
CALCIUM: 8.4 mg/dL (ref 8.4–10.5)
CO2: 31 meq/L (ref 19–32)
Chloride: 102 mEq/L (ref 96–112)
Creatinine, Ser: 0.47 mg/dL — ABNORMAL LOW (ref 0.50–1.35)
GFR calc Af Amer: >90 mL/min (ref >90)
GFR calc non Af Amer: >90 mL/min (ref >90)
GLUCOSE: 154 mg/dL — AB (ref 70–99)
POTASSIUM: 3.6 meq/L — AB (ref 3.7–5.3)
SODIUM: 140 meq/L (ref 137–147)

## 2013-07-30 LAB — GLUCOSE, CAPILLARY
GLUCOSE-CAPILLARY: 136 mg/dL — AB (ref 70–99)
Glucose-Capillary: 125 mg/dL — ABNORMAL HIGH (ref 70–99)
Glucose-Capillary: 194 mg/dL — ABNORMAL HIGH (ref 70–99)
Glucose-Capillary: 164 mg/dL — ABNORMAL HIGH (ref 70–99)

## 2013-07-30 LAB — CLOSTRIDIUM DIFFICILE BY PCR: CDIFFPCR: NEGATIVE

## 2013-07-30 MED ORDER — OXYCODONE HCL 5 MG PO TABS
20.0000 mg | ORAL_TABLET | Freq: Four times a day (QID) | ORAL | Status: DC | PRN
  Administered 2013-07-30 – 2013-08-02 (×13): 20 mg via ORAL
  Filled 2013-07-30 (×13): qty 4

## 2013-07-30 MED ORDER — FUROSEMIDE 10 MG/ML IJ SOLN
40.0000 mg | Freq: Three times a day (TID) | INTRAMUSCULAR | Status: DC
  Administered 2013-07-30 – 2013-08-01 (×6): 40 mg via INTRAVENOUS
  Filled 2013-07-30 (×9): qty 4

## 2013-07-30 MED ORDER — POLYSACCHARIDE IRON COMPLEX 150 MG PO CAPS
150.0000 mg | ORAL_CAPSULE | Freq: Two times a day (BID) | ORAL | Status: DC
  Administered 2013-07-30 – 2013-08-07 (×17): 150 mg via ORAL
  Filled 2013-07-30 (×19): qty 1

## 2013-07-30 MED ORDER — DIPHENHYDRAMINE HCL 25 MG PO CAPS
25.0000 mg | ORAL_CAPSULE | Freq: Once | ORAL | Status: AC
  Administered 2013-07-30: 25 mg via ORAL
  Filled 2013-07-30: qty 1

## 2013-07-30 NOTE — Progress Notes (Signed)
Platelets level equal 8.0, notified MD. Report given to morning RN.

## 2013-07-30 NOTE — Procedures (Signed)
Successful placement of dual lumen PICC line to right basilic vein. Length 41cm Tip at lower SVC/RA No complications Ready for use.  Tsosie Billing PA-C Interventional Radiology  07/30/13 2:34 PM

## 2013-07-30 NOTE — Progress Notes (Signed)
[facility] PROGRESS NOTE  Tony Boyd OHY:[id number] DOB: [birth year] DOA: 07/23/2013 PCP: Hollace Kinnier, DO  Assessment/Plan: 1-left intertrochanteric fracture: s/p nailing by orthopedic surgery. -slowly progressing -will need SNF for rehab -patient will like to be evaluated for possible CIR.  2-ABLA: s/p transfusion (FFP and PRBC's) -Hgb stable currently 7.4 -will follow trend and transfuse further as needed (if Hgb less than 7)  3-Cirrhosis/ESLD -with anasarca and fluid overload -continue lasix IV -follow VS and volume status -continue rifaximin and lactulose  4-ETOH abuse: -cessation counseling provided -continue CIWA, thiamine and folic acid  5-GERD:continue PPI  6-thrombocytopenia: due to cirrhosis  -will continue SCD's and avoid heparin products -given levels less than 10,000 will transfuse 1 unit of platelets  7-DM type 2: -continue SSI -follow CBG's now that patient is eating better and adjust hypoglycemic regimen as needed  8-hypothyroidism: -continue synthroid  9-Hx of seizure: no seizure activity appreciated -continue keppra  10-depression/anxiety: continue risperdal and paxil  11-resp failure and hypotension: -resolved now -continue flutter valve/IS and PRN oxygen supplementation.  10-chronic pain: will adjust oxycodone to match home regimen.  Code Status: Full Family Communication: no family at bedside Disposition Plan: SNF when medically stable   Consultants:  Orthopedic service  PCCM  Procedures:  Left hip intertrochanteric nailing   Antibiotics:  None   HPI/Subjective: Complaining of pain on his left leg and back; no fever. No signs of overt bleeding. Patient lost IV line. Platelets 8,000  Objective: Filed Vitals:   07/30/13 0204  BP: 117/69  Pulse: 93  Temp:   Resp:     Intake/Output Summary (Last 24 hours) at 07/30/13 0835 Last data filed at 07/30/13 0700  Gross per 24 hour  Intake    240 ml  Output   5400 ml   Net  -5160 ml   Filed Weights   07/28/13 1928 07/28/13 2023 07/28/13 2226  Weight: 139.3 kg (307 lb 1.6 oz) 135.4 kg (298 lb 8.1 oz) 135.7 kg (299 lb 2.6 oz)    Exam:   General:  AAOX3, no fever. Still fluid overload; patient pulled IV line  Cardiovascular: S1 and S2, no rubs or gallops; 3 ++ edema bilaterally  Respiratory: fine crackles at bases, improved air movement  Abdomen: distended, non tender; positive BS  Musculoskeletal: no cyanosis; pain with movement on left lower extremity   Data Reviewed: Basic Metabolic Panel:  Recent Labs Lab 07/26/13 0445 07/27/13 0430 07/28/13 0600 07/29/13 0655 07/30/13 0515  NA 138 138 140 142 140  K 3.3* 3.6* 3.7 3.6* 3.6*  CL 102 104 104 104 102  CO2 31 30 32 31 31  GLUCOSE 165* 172* 132* 148* 154*  BUN 31* 23 17 12 8   CREATININE 1.16 0.70 0.54 0.50 0.47*  CALCIUM 7.1* 7.1* 8.0* 8.5 8.4  MG  --   --   --  1.5  --   PHOS  --   --   --  1.7*  --    Liver Function Tests:  Recent Labs Lab 07/23/13 1535 07/28/13 0600  AST 43* 37  ALT 16 14  ALKPHOS 168* 121*  BILITOT 5.5* 5.8*  PROT 5.8* 5.2*  ALBUMIN 1.9* 2.2*    Recent Labs Lab 07/23/13 1639  AMMONIA 31   CBC:  Recent Labs Lab 07/23/13 1535  07/26/13 1235 07/27/13 0430 07/28/13 0600 07/29/13 0655 07/30/13 0515  WBC 8.2  < > 7.2 3.9* 2.4* 2.9* 2.3*  NEUTROABS 6.9  --   --   --   --   --   --  HGB 9.9*  < > 8.5* 7.6* 7.4* 8.6* 7.4*  HCT 29.1*  < > 25.0* 22.8* 22.3* 25.7* 22.6*  MCV 105.1*  < > 97.7 100.0 102.3* 103.6* 104.1*  PLT 42*  < > 37* 30* 30* 35* 8*  < > = values in this interval not displayed. Cardiac Enzymes:  Recent Labs Lab 07/23/13 1535  CKTOTAL 238*   BNP (last 3 results)  Recent Labs  05/31/13 2330 06/22/13 1738 06/30/13 2225  PROBNP 304.0* 296.3* 669.2*   CBG:  Recent Labs Lab 07/29/13 0750 07/29/13 1136 07/29/13 1730 07/29/13 2204 07/30/13 0730  GLUCAP 124* 164* 189* 157* 136*    Recent Results (from the past  240 hour(s))  SURGICAL PCR SCREEN     Status: Abnormal   Collection Time    07/24/13 11:38 AM      Result Value Ref Range Status   MRSA, PCR NEGATIVE  NEGATIVE Final   Staphylococcus aureus POSITIVE (*) NEGATIVE Final   Comment:            The Xpert SA Assay (FDA     approved for NASAL specimens     in patients over 53 years of age),     is one component of     a comprehensive surveillance     program.  Test performance has     been validated by [employer] for patients greater     than or equal to 67 year old.     It is not intended     to diagnose infection nor to     guide or monitor treatment.  MRSA PCR SCREENING     Status: None   Collection Time    07/24/13  3:30 PM      Result Value Ref Range Status   MRSA by PCR NEGATIVE  NEGATIVE Final   Comment:            The GeneXpert MRSA Assay (FDA     approved for NASAL specimens     only), is one component of a     comprehensive MRSA colonization     surveillance program. It is not     intended to diagnose MRSA     infection nor to guide or     monitor treatment for     MRSA infections.     Studies: No results found.  Scheduled Meds: . feeding supplement (ENSURE COMPLETE)  237 mL Oral BID BM  . folic acid  1 mg Oral Daily  . furosemide  40 mg Intravenous Q8H  . insulin aspart  0-15 Units Subcutaneous TID WC  . iron polysaccharides  150 mg Oral BID  . lactulose  40 g Oral BID  . levETIRAcetam  1,000 mg Oral BID  . levothyroxine  75 mcg Oral QAC breakfast  . multivitamin with minerals  1 tablet Oral q morning - 10a  . pantoprazole  40 mg Oral Daily  . PARoxetine  20 mg Oral Daily  . potassium chloride  40 mEq Oral Daily  . rifaximin  550 mg Oral BID  . thiamine  100 mg Oral Daily   Continuous Infusions: . sodium chloride 10 mL/hr at 07/27/13 2000    Active Problems:   ETOH abuse   Hepatitis C   Cirrhosis   Seizure disorder   Hypothyroidism   Diabetes mellitus type 2, uncontrolled, with  complications   Ascites   Chronic back pain   Fracture, intertrochanteric, left femur  Respiratory failure   Hypotension   Encephalopathy acute    Time spent: >30 minutes    [place] [facility] Pager [phone]. If 7PM-7AM, please contact night-coverage at [email or web address], password [facility] 07/30/2013, 8:35 AM  LOS: 7 days
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
## 2013-07-30 NOTE — Progress Notes (Signed)
Rehab admissions - I have called and left messages for 2 daughters.  I need to confirm discharge plan so that we will know if CIR versus SNF will be the venue needed.  Waiting to receive call back from family.  Call me for questions.  #[phone]

## 2013-07-30 NOTE — Progress Notes (Signed)
Physical Therapy Treatment Patient Details Name: Tony Boyd MRN: [id number] DOB: 1955-08-15 Today's Date: 07/30/2013    History of Present Illness 58 yo former smoker with history of ETOH / Hepatitis liver disease fell at home resulting in L intertrochanteric fx and pulmonary contusion.  Had Nailing of hip on 5/29 associated with significant blood loss, and remained on vent post-op.    PT Comments    Pt stood better today, but continues to require +2 physical A.  Pt with confusion throughout session, but oriented when asked standard orientation questions.  Did well with standing while bandage removed and skin cleaned.  Follow Up Recommendations  SNF;Supervision/Assistance - 24 hour     Equipment Recommendations  None recommended by PT    Recommendations for Other Services       Precautions / Restrictions Precautions Precautions: Fall Precaution Comments: pt edematous everywhere, bilat UEs/LEs and abdomen Restrictions LLE Weight Bearing: Weight bearing as tolerated    Mobility  Bed Mobility   Bed Mobility: Supine to Sit Rolling: Max assist   Supine to sit: Max assist;+2 for physical assistance Sit to supine: +2 for physical assistance;Max assist   General bed mobility comments: Max A with cueing for hand placement and how to A with transfers  Transfers Overall transfer level: Needs assistance Equipment used: Rolling walker (2 wheeled) Transfers: Sit to/from Stand Sit to Stand: Mod assist;+2 physical assistance;From elevated surface Stand pivot transfers: Min assist;+2 physical assistance (with RW to [facility])       General transfer comment: Pt stood with MOD of 2 and turned with MIN A of 2, but needed A for RW placement with SPT.  Ambulation/Gait                 Stairs            Wheelchair Mobility    Modified Rankin (Stroke Patients Only)       Balance     Sitting balance-Leahy Scale: Good     Standing balance support: Bilateral upper  extremity supported Standing balance-Leahy Scale: Poor                      Cognition Arousal/Alertness: Awake/alert Behavior During Therapy: WFL for tasks assessed/performed Overall Cognitive Status: Impaired/Different from baseline       Memory: Decreased short-term memory Following Commands: Follows one step commands with increased time Safety/Judgement: Decreased awareness of safety;Decreased awareness of deficits   Problem Solving: Slow processing;Requires verbal cues;Difficulty sequencing General Comments: Pt oriented to place, but confused conersation throughout session.    Exercises      General Comments General comments (skin integrity, edema, etc.): Stood while bandage was removed and for cleaning.      Pertinent Vitals/Pain Pt unable to rate but soreness in L LE.    Home Living                      Prior Function            PT Goals (current goals can now be found in the care plan section) Progress towards PT goals: Progressing toward goals    Frequency  Min 4X/week    PT Plan Current plan remains appropriate    Co-evaluation             End of Session Equipment Utilized During Treatment: Gait belt Activity Tolerance: Patient limited by fatigue Patient left: in bed (no chair alarm available &pt fatigued from earlier procedure)  Time: 8250-5397 PT Time Calculation (min): 31 min  Charges:  $Therapeutic Activity: 23-37 mins                    G Codes:      Galen Manila 08/25/2013, 3:27 PM

## 2013-07-31 LAB — CBC
HEMATOCRIT: 24.4 % — AB (ref 39.0–52.0)
Hemoglobin: 7.8 g/dL — ABNORMAL LOW (ref 13.0–17.0)
MCH: 34.5 pg — AB (ref 26.0–34.0)
MCHC: 32 g/dL (ref 30.0–36.0)
MCV: 108 fL — ABNORMAL HIGH (ref 78.0–100.0)
Platelets: 49 10*3/uL — ABNORMAL LOW (ref 150–400)
RBC: 2.26 MIL/uL — ABNORMAL LOW (ref 4.22–5.81)
RDW: 22 % — AB (ref 11.5–15.5)
WBC: 3 10*3/uL — ABNORMAL LOW (ref 4.0–10.5)

## 2013-07-31 LAB — GLUCOSE, CAPILLARY
GLUCOSE-CAPILLARY: 162 mg/dL — AB (ref 70–99)
Glucose-Capillary: 124 mg/dL — ABNORMAL HIGH (ref 70–99)
Glucose-Capillary: 155 mg/dL — ABNORMAL HIGH (ref 70–99)
Glucose-Capillary: 150 mg/dL — ABNORMAL HIGH (ref 70–99)

## 2013-07-31 LAB — PREPARE PLATELET PHERESIS
UNIT DIVISION: 0
Unit division: 0

## 2013-07-31 MED ORDER — SODIUM CHLORIDE 0.9 % IJ SOLN
10.0000 mL | Freq: Two times a day (BID) | INTRAMUSCULAR | Status: DC
Start: 2013-07-31 — End: 2013-08-07

## 2013-07-31 MED ORDER — SPIRONOLACTONE 25 MG PO TABS
25.0000 mg | ORAL_TABLET | Freq: Two times a day (BID) | ORAL | Status: DC
  Administered 2013-07-31 – 2013-08-07 (×15): 25 mg via ORAL
  Filled 2013-07-31 (×17): qty 1

## 2013-07-31 MED ORDER — SODIUM CHLORIDE 0.9 % IJ SOLN
10.0000 mL | INTRAMUSCULAR | Status: DC | PRN
  Administered 2013-07-31: 10 mL
  Administered 2013-08-01: 20 mL
  Administered 2013-08-01 – 2013-08-05 (×8): 10 mL

## 2013-07-31 NOTE — Progress Notes (Signed)
Rehab admissions - I met with patient this am.  He can answer some of my questions, but I feel he is confused and all answers do not make sense.  He tells me that both daughters work.  I don't know that patient would have adequate supervision at home after a potential inpatient rehab admission.  I left messages with 2 daughters and they have not called me back.  I met with Education officer, museum and case managers.  Recommending pursuit of SNF at this time.  Call me for questions.  #[phone]

## 2013-07-31 NOTE — Progress Notes (Signed)
Occupational Therapy Treatment Patient Details Name: Tony Boyd MRN: [id number] DOB: 1955-05-01 Today's Date: 07/31/2013    History of present illness 58 yo former smoker with history of ETOH / Hepatitis liver disease fell at home resulting in L intertrochanteric fx and pulmonary contusion.  Had Nailing of hip on 5/29 associated with significant blood loss, and remained on vent post-op.   OT comments  Pt progressing toward goals. Will continue to follow.  Follow Up Recommendations  SNF;Supervision/Assistance - 24 hour    Equipment Recommendations  3 in 1 bedside comode    Recommendations for Other Services      Precautions / Restrictions Precautions Precautions: Fall Precaution Comments: pt edematous everywhere, bilat UEs/LEs and abdomen Restrictions LLE Weight Bearing: Weight bearing as tolerated       Mobility Bed Mobility                  Transfers                      Balance                                   ADL       Grooming: Wash/dry face;Brushing hair;Oral care;Minimal assistance;Bed level   Upper Body Bathing: Minimal assitance;Bed level;Cueing for sequencing (cues to complete task)                                    Vision                     Perception     Praxis      Cognition   Behavior During Therapy: [facility] for tasks assessed/performed Overall Cognitive Status: Impaired/Different from baseline       Memory: Decreased short-term memory  Following Commands: Follows one step commands with increased time            Extremity/Trunk Assessment               Exercises General Exercises - Upper Extremity Shoulder Flexion: AROM;Left;10 reps;Supine Shoulder ABduction: AROM;Both;10 reps;Supine Shoulder ADduction: AROM;Both;10 reps;Supine Elbow Flexion: AROM;Both;10 reps;Supine Elbow Extension: AROM;Both;10 reps;Supine Wrist Flexion: AROM;Both;10 reps;Supine Wrist Extension:  AROM;Both;10 reps;Supine Digit Composite Flexion: AROM;Both;10 reps;Supine Composite Extension: AROM;Both;10 reps;Supine   Shoulder Instructions       General Comments      Pertinent Vitals/ Pain       See vitals tab  Home Living                                          Prior Functioning/Environment              Frequency Min 2X/week     Progress Toward Goals  OT Goals(current goals can now be found in the care plan section)  Progress towards OT goals: Progressing toward goals  Acute Rehab OT Goals Patient Stated Goal: to go home Time For Goal Achievement: 08/10/13 Potential to Achieve Goals: Good ADL Goals Pt Will Perform Upper Body Bathing: with supervision;with set-up;sitting Pt Will Transfer to Toilet: with mod assist;bedside commode Pt Will Perform Toileting - Clothing Manipulation and hygiene: with mod assist;sit to/from stand  Plan Discharge plan remains appropriate    Co-evaluation  End of Session     Activity Tolerance Patient tolerated treatment well   Patient Left in bed;with call bell/phone within reach   Nurse Communication          Time: 1345-1356 OT Time Calculation (min): 11 min  Charges: OT General Charges $OT Visit: 1 Procedure OT Treatments $Self Care/Home Management : 8-22 mins  Luther Bradley 07/31/2013, 3:39 PM  07/31/2013 Luther Bradley OTR/L Pager [phone] Office [phone]

## 2013-07-31 NOTE — Clinical Social Work Note (Signed)
Attempted to reach family to discuss discharge plans for patient. Called daughter Levin Erp - [phone], and message left. CSW also attempted to reach patient's daughter [name] - [phone] and message stated number disconnected, changed or no longer in service.  Eyoel Throgmorton Givens, MSW, LCSW [phone]

## 2013-07-31 NOTE — Progress Notes (Signed)
[facility] PROGRESS NOTE  Lance Huaracha CNO:[id number] DOB: [birth year] DOA: 07/23/2013 PCP: Hollace Kinnier, DO  Assessment/Plan: 1-left intertrochanteric fracture: s/p nailing by orthopedic surgery. -slowly progressing -will need SNF for rehabilitation -patient has not been accepted by CIR (lack of support at discharge)  2-ABLA: s/p transfusion (FFP and PRBC's) -Hgb stable currently 7.8 (6/5) -will follow trend and transfuse further as needed (if Hgb less than 7)  3-Cirrhosis/ESLD -with anasarca and fluid overload -continue lasix IV and restart spironolactone -follow VS and volume status -continue rifaximin and lactulose  4-ETOH abuse: -cessation counseling provided -continue CIWA, thiamine and folic acid  5-GERD:continue PPI  6-thrombocytopenia: due to cirrhosis  -will continue SCD's and avoid heparin products -after one unit of platelets given on 6/4 platelets are 45,000 -will monitor  7-DM type 2: -continue SSI -follow CBG's now that patient is eating better and adjust hypoglycemic regimen as needed  8-hypothyroidism: -continue synthroid  9-Hx of seizure: no seizure activity appreciated -continue keppra  10-depression/anxiety: continue risperdal and paxil  11-resp failure and hypotension: -resolved now -continue flutter valve/IS and PRN oxygen supplementation.  10-chronic pain: will continue current PO analgesic regimen with oxycodone 20mg  Q6HPRN  Code Status: Full Family Communication: no family at bedside Disposition Plan: SNF when medically stable   Consultants:  Orthopedic service  PCCM  Procedures:  Left hip intertrochanteric nailing   Antibiotics:  None   HPI/Subjective: Reports pain is better in his back and LLE, but still present. Patient afebrile and after 1 unit of platelets given on 07/30/13, platelets count now 45,000  Objective: Filed Vitals:   07/31/13 0928  BP: 126/70  Pulse: 93  Temp: 98.7 F (37.1 C)  Resp: 18     Intake/Output Summary (Last 24 hours) at 07/31/13 1511 Last data filed at 07/31/13 1055  Gross per 24 hour  Intake   1152 ml  Output   5000 ml  Net  -3848 ml   Filed Weights   07/28/13 2023 07/28/13 2226 07/30/13 2137  Weight: 135.4 kg (298 lb 8.1 oz) 135.7 kg (299 lb 2.6 oz) 131.135 kg (289 lb 1.6 oz)    Exam:   General:  AAOX3 (some intermittent episodes of confabulation), no fever. Still with signs of fluid overload on exam  Cardiovascular: S1 and S2, no rubs or gallops; 3 ++ edema bilaterally  Respiratory: no frank crackles at bases, improved air movement  Abdomen: distended, non tender; positive BS  Musculoskeletal: no cyanosis; pain with movement on left lower extremity   Data Reviewed: Basic Metabolic Panel:  Recent Labs Lab 07/26/13 0445 07/27/13 0430 07/28/13 0600 07/29/13 0655 07/30/13 0515  NA 138 138 140 142 140  K 3.3* 3.6* 3.7 3.6* 3.6*  CL 102 104 104 104 102  CO2 31 30 32 31 31  GLUCOSE 165* 172* 132* 148* 154*  BUN 31* 23 17 12 8   CREATININE 1.16 0.70 0.54 0.50 0.47*  CALCIUM 7.1* 7.1* 8.0* 8.5 8.4  MG  --   --   --  1.5  --   PHOS  --   --   --  1.7*  --    Liver Function Tests:  Recent Labs Lab 07/28/13 0600  AST 37  ALT 14  ALKPHOS 121*  BILITOT 5.8*  PROT 5.2*  ALBUMIN 2.2*   CBC:  Recent Labs Lab 07/27/13 0430 07/28/13 0600 07/29/13 0655 07/30/13 0515 07/31/13 0445  WBC 3.9* 2.4* 2.9* 2.3* 3.0*  HGB 7.6* 7.4* 8.6* 7.4* 7.8*  HCT 22.8* 22.3* 25.7* 22.6* 24.4*  MCV 100.0 102.3* 103.6* 104.1* 108.0*  PLT 30* 30* 35* 8* 49*   BNP (last 3 results)  Recent Labs  05/31/13 2330 06/22/13 1738 06/30/13 2225  PROBNP 304.0* 296.3* 669.2*   CBG:  Recent Labs Lab 07/30/13 1132 07/30/13 1738 07/30/13 2136 07/31/13 0757 07/31/13 1139  GLUCAP 125* 194* 164* 124* 155*    Recent Results (from the past 240 hour(s))  SURGICAL PCR SCREEN     Status: Abnormal   Collection Time    07/24/13 11:38 AM      Result Value  Ref Range Status   MRSA, PCR NEGATIVE  NEGATIVE Final   Staphylococcus aureus POSITIVE (*) NEGATIVE Final   Comment:            The Xpert SA Assay (FDA     approved for NASAL specimens     in patients over 58 years of age),     is one component of     a comprehensive surveillance     program.  Test performance has     been validated by [employer] for patients greater     than or equal to 58 year old.     It is not intended     to diagnose infection nor to     guide or monitor treatment.  MRSA PCR SCREENING     Status: None   Collection Time    07/24/13  3:30 PM      Result Value Ref Range Status   MRSA by PCR NEGATIVE  NEGATIVE Final   Comment:            The GeneXpert MRSA Assay (FDA     approved for NASAL specimens     only), is one component of a     comprehensive MRSA colonization     surveillance program. It is not     intended to diagnose MRSA     infection nor to guide or     monitor treatment for     MRSA infections.  CLOSTRIDIUM DIFFICILE BY PCR     Status: None   Collection Time    07/30/13  9:29 AM      Result Value Ref Range Status   C difficile by pcr NEGATIVE  NEGATIVE Final     Studies: Ir Fluoro Guide Cv Line Right  07/30/2013   CLINICAL DATA:  Poor venous access, request for peripherally inserted central catheter.  EXAM: Right arm dual lumen power injectable PICC LINE PLACEMENT WITH ULTRASOUND AND FLUOROSCOPIC GUIDANCE  FLUOROSCOPY TIME:  18 seconds  PROCEDURE: The patient was advised of the possible risks and complications and agreed to undergo the procedure. The patient was then brought to the angiographic suite for the procedure.  The right arm was prepped with chlorhexidine, draped in the usual sterile fashion using maximum barrier technique (cap and mask, sterile gown, sterile gloves, large sterile sheet, hand hygiene and cutaneous antisepsis) and infiltrated locally with 1% Lidocaine.  Ultrasound demonstrated patency of the right basilic vein, and  this was documented with an image. Under real-time ultrasound guidance, this vein was accessed with a 21 gauge micropuncture needle and image documentation was performed. A 0.018 wire was introduced in to the vein. Over this, a 5 [place] dual lumen power-injectable PICC was advanced to the lower SVC/right atrial junction. Fluoroscopy during the procedure and fluoro spot radiograph confirms appropriate catheter position. The catheter was flushed and covered with a sterile dressing.  Catheter length:  41 cm  Complications: None.  IMPRESSION: Successful right arm Power injectable PICC line placement with ultrasound and fluoroscopic guidance. The catheter is ready for use.  Read By:  Tsosie Billing PA-C   Electronically Signed   By: Jacqulynn Cadet M.D.   On: 07/30/2013 14:27   Ir US Guide Vasc Access Right  07/30/2013   CLINICAL DATA:  Poor venous access, request for peripherally inserted central catheter.  EXAM: Right arm dual lumen power injectable PICC LINE PLACEMENT WITH ULTRASOUND AND FLUOROSCOPIC GUIDANCE  FLUOROSCOPY TIME:  18 seconds  PROCEDURE: The patient was advised of the possible risks and complications and agreed to undergo the procedure. The patient was then brought to the angiographic suite for the procedure.  The right arm was prepped with chlorhexidine, draped in the usual sterile fashion using maximum barrier technique (cap and mask, sterile gown, sterile gloves, large sterile sheet, hand hygiene and cutaneous antisepsis) and infiltrated locally with 1% Lidocaine.  Ultrasound demonstrated patency of the right basilic vein, and this was documented with an image. Under real-time ultrasound guidance, this vein was accessed with a 21 gauge micropuncture needle and image documentation was performed. A 0.018 wire was introduced in to the vein. Over this, a 5 [place] dual lumen power-injectable PICC was advanced to the lower SVC/right atrial junction. Fluoroscopy during the procedure and fluoro spot  radiograph confirms appropriate catheter position. The catheter was flushed and covered with a sterile dressing.  Catheter length:  41 cm  Complications: None.  IMPRESSION: Successful right arm Power injectable PICC line placement with ultrasound and fluoroscopic guidance. The catheter is ready for use.  Read By:  Tsosie Billing PA-C   Electronically Signed   By: Jacqulynn Cadet M.D.   On: 07/30/2013 14:27    Scheduled Meds: . feeding supplement (ENSURE COMPLETE)  237 mL Oral BID BM  . folic acid  1 mg Oral Daily  . furosemide  40 mg Intravenous 3 times per day  . insulin aspart  0-15 Units Subcutaneous TID WC  . iron polysaccharides  150 mg Oral BID  . lactulose  40 g Oral BID  . levETIRAcetam  1,000 mg Oral BID  . levothyroxine  75 mcg Oral QAC breakfast  . multivitamin with minerals  1 tablet Oral q morning - 10a  . pantoprazole  40 mg Oral Daily  . PARoxetine  20 mg Oral Daily  . potassium chloride  40 mEq Oral Daily  . rifaximin  550 mg Oral BID  . thiamine  100 mg Oral Daily   Continuous Infusions: . sodium chloride 10 mL/hr at 07/27/13 2000    Active Problems:   ETOH abuse   Hepatitis C   Cirrhosis   Seizure disorder   Hypothyroidism   Diabetes mellitus type 2, uncontrolled, with complications   Ascites   Chronic back pain   Fracture, intertrochanteric, left femur   Respiratory failure   Hypotension   Encephalopathy acute    Time spent: >30 minutes    [facility] Pager [phone]. If 7PM-7AM, please contact night-coverage at [email or web address], password [facility] 07/31/2013, 3:11 PM  LOS: 8 days

## 2013-07-31 NOTE — Progress Notes (Signed)
Physical Therapy Treatment Patient Details Name: Tony Boyd MRN: [id number] DOB: [birth year] Today's Date: 07/31/2013    History of Present Illness 58 yo former smoker with history of ETOH / Hepatitis liver disease fell at home resulting in L intertrochanteric fx and pulmonary contusion.  Had Nailing of hip on 5/29 associated with significant blood loss, and remained on vent post-op.    PT Comments    Patient making slow gains with mobility.  Continues to be confused/disoriented with decreased safety awareness.  Follow Up Recommendations  SNF;Supervision/Assistance - 24 hour     Equipment Recommendations  None recommended by PT    Recommendations for Other Services       Precautions / Restrictions Precautions Precautions: Fall Precaution Comments: pt edematous everywhere, bilat UEs/LEs and abdomen Restrictions Weight Bearing Restrictions: Yes LLE Weight Bearing: Weight bearing as tolerated    Mobility  Bed Mobility Overal bed mobility: Needs Assistance Bed Mobility: Supine to Sit     Supine to sit: Mod assist;+2 for physical assistance     General bed mobility comments: Verbal and tactile cues for technique.  Assist to bring LLE off of bed and to raise trunk to sitting.  Patient required increased time to perform tasks.  Transfers Overall transfer level: Needs assistance Equipment used: Rolling walker (2 wheeled) Transfers: Sit to/from [facility] Sit to Stand: Mod assist;+2 physical assistance Stand pivot transfers: Min assist;+2 physical assistance       General transfer comment: Verbal cues for hand placement.  Mod assist of 2 to rise to standing and for balance.  Instructed patient on transfer technique.  Patient able to take several steps to pivot to chair.  Assist of 2 to control descent into recliner.  Lift pad in place under patient for return to bed.  Ambulation/Gait                 Stairs            Wheelchair Mobility     Modified Rankin (Stroke Patients Only)       Balance                                    Cognition Arousal/Alertness: Awake/alert Behavior During Therapy: Anxious Overall Cognitive Status: Impaired/Different from baseline Area of Impairment: Orientation;Memory;Safety/judgement;Awareness;Problem solving Orientation Level: Disoriented to;Place;Time;Situation Current Attention Level: Sustained Memory: Decreased short-term memory Following Commands: Follows one step commands with increased time Safety/Judgement: Decreased awareness of safety;Decreased awareness of deficits   Problem Solving: Slow processing;Difficulty sequencing;Requires verbal cues General Comments: Patient very confused/disoriented.  States he and daughter were visiting in a field today.  Conversation not in sinc with actual events.    Exercises General Exercises - Upper Extremity Shoulder Flexion: AROM;Left;10 reps;Supine Shoulder ABduction: AROM;Both;10 reps;Supine Shoulder ADduction: AROM;Both;10 reps;Supine Elbow Flexion: AROM;Both;10 reps;Supine Elbow Extension: AROM;Both;10 reps;Supine Wrist Flexion: AROM;Both;10 reps;Supine Wrist Extension: AROM;Both;10 reps;Supine Digit Composite Flexion: AROM;Both;10 reps;Supine Composite Extension: AROM;Both;10 reps;Supine    General Comments        Pertinent Vitals/Pain Pain in LLE limiting mobility.    Home Living                      Prior Function            PT Goals (current goals can now be found in the care plan section) Acute Rehab PT Goals Patient Stated Goal: to go home Progress towards PT goals:  Progressing toward goals    Frequency  Min 4X/week    PT Plan Current plan remains appropriate    Co-evaluation             End of Session Equipment Utilized During Treatment: Gait belt Activity Tolerance: Patient limited by fatigue;Patient limited by pain Patient left: in chair;with call bell/phone within  reach;with nursing/sitter in room     Time: 1572-6203 PT Time Calculation (min): 26 min  Charges:  $Therapeutic Activity: 23-37 mins                    G Codes:      Despina Pole 08-08-13, 5:15 PM Carita Pian. Sanjuana Kava, [place] Pager [phone]

## 2013-08-01 DIAGNOSIS — D62 Acute posthemorrhagic anemia: Secondary | ICD-10-CM

## 2013-08-01 DIAGNOSIS — F4310 Post-traumatic stress disorder, unspecified: Secondary | ICD-10-CM

## 2013-08-01 DIAGNOSIS — F1910 Other psychoactive substance abuse, uncomplicated: Secondary | ICD-10-CM

## 2013-08-01 DIAGNOSIS — F1994 Other psychoactive substance use, unspecified with psychoactive substance-induced mood disorder: Secondary | ICD-10-CM

## 2013-08-01 LAB — HEPATIC FUNCTION PANEL
ALBUMIN: 2.4 g/dL — AB (ref 3.5–5.2)
ALT: 13 U/L (ref 0–53)
AST: 30 U/L (ref 0–37)
Alkaline Phosphatase: 126 U/L — ABNORMAL HIGH (ref 39–117)
BILIRUBIN TOTAL: 7.5 mg/dL — AB (ref 0.3–1.2)
Bilirubin, Direct: 2.8 mg/dL — ABNORMAL HIGH (ref 0.0–0.3)
Indirect Bilirubin: 4.7 mg/dL — ABNORMAL HIGH (ref 0.3–0.9)
TOTAL PROTEIN: 5.5 g/dL — AB (ref 6.0–8.3)

## 2013-08-01 LAB — CBC
HCT: 24.6 % — ABNORMAL LOW (ref 39.0–52.0)
HEMOGLOBIN: 7.8 g/dL — AB (ref 13.0–17.0)
MCH: 34.1 pg — AB (ref 26.0–34.0)
MCHC: 31.7 g/dL (ref 30.0–36.0)
MCV: 107.4 fL — AB (ref 78.0–100.0)
Platelets: 49 10*3/uL — ABNORMAL LOW (ref 150–400)
RBC: 2.29 MIL/uL — AB (ref 4.22–5.81)
RDW: 22.1 % — ABNORMAL HIGH (ref 11.5–15.5)
WBC: 4.2 10*3/uL (ref 4.0–10.5)

## 2013-08-01 LAB — BASIC METABOLIC PANEL
BUN: 8 mg/dL (ref 6–23)
CALCIUM: 8 mg/dL — AB (ref 8.4–10.5)
CHLORIDE: 96 meq/L (ref 96–112)
CO2: 36 meq/L — AB (ref 19–32)
Creatinine, Ser: 0.59 mg/dL (ref 0.50–1.35)
GFR calc Af Amer: >90 mL/min (ref >90)
GFR calc non Af Amer: >90 mL/min (ref >90)
GLUCOSE: 121 mg/dL — AB (ref 70–99)
Potassium: 3.6 mEq/L — ABNORMAL LOW (ref 3.7–5.3)
Sodium: 138 mEq/L (ref 137–147)

## 2013-08-01 LAB — GLUCOSE, CAPILLARY
Glucose-Capillary: 109 mg/dL — ABNORMAL HIGH (ref 70–99)
Glucose-Capillary: 125 mg/dL — ABNORMAL HIGH (ref 70–99)
Glucose-Capillary: 152 mg/dL — ABNORMAL HIGH (ref 70–99)
Glucose-Capillary: 169 mg/dL — ABNORMAL HIGH (ref 70–99)

## 2013-08-01 LAB — AMMONIA: AMMONIA: 38 umol/L (ref 11–60)

## 2013-08-01 MED ORDER — OXYCODONE HCL 5 MG PO TABS
10.0000 mg | ORAL_TABLET | Freq: Once | ORAL | Status: AC
  Administered 2013-08-01: 10 mg via ORAL
  Filled 2013-08-01: qty 2

## 2013-08-01 MED ORDER — FUROSEMIDE 10 MG/ML IJ SOLN
40.0000 mg | Freq: Two times a day (BID) | INTRAMUSCULAR | Status: DC
  Administered 2013-08-01 – 2013-08-06 (×10): 40 mg via INTRAVENOUS
  Filled 2013-08-01 (×16): qty 4

## 2013-08-01 NOTE — Progress Notes (Signed)
[facility] PROGRESS NOTE  Tony Boyd GDJ:[id number] DOB: 1955-05-02 DOA: 07/23/2013 PCP: Hollace Kinnier, DO  Assessment/Plan: 1-left intertrochanteric fracture: s/p nailing by orthopedic surgery. -slowly progressing -will need SNF for rehabilitation but patient states he wants to go home. Unclear if he has capacity to decide. -patient has not been accepted by CIR (lack of support at discharge)  2-ABLA: s/p transfusion (FFP and PRBC's) -Hgb continues to bestable -will follow trend and transfuse further as needed (if Hgb less than 7)  3-Cirrhosis/ESLD -with anasarca and fluid overload -He is diuresing well. Continue lasix IV but reduce to q12h. Continue spironolactone -follow VS and volume status -continue rifaximin and lactulose. Check Ammonia.  4-ETOH abuse: -cessation counseling provided -continue CIWA, thiamine and folic acid  5-GERD:continue PPI  6-thrombocytopenia: due to cirrhosis  -will continue SCD's and avoid heparin products -after one unit of platelets given on 6/4 platelets are 45,000 -Counts stable.  7-DM type 2: -continue SSI -follow CBG's now that patient is eating better and adjust hypoglycemic regimen as needed  8-hypothyroidism: -continue synthroid  9-Hx of seizure: no seizure activity appreciated -continue keppra  10-depression/anxiety: continue risperdal and paxil  11-resp failure and hypotension: -resolved now -continue flutter valve/IS and PRN oxygen supplementation.  10-chronic pain: will continue current PO analgesic regimen with oxycodone 20mg  Q6HPRN  DVT Prophylaxis: SCD's only due to severe thrombocytopenia Code Status: Full Family Communication: no family at bedside Disposition Plan: SNF when medically stable but patient is refusing. Will need to determine if he has capacity. Will consult [facility].    Consultants:  Orthopedic service  PCCM  Procedures:  Left hip intertrochanteric nailing   PICC  line  Antibiotics:  None   HPI/Subjective: Patient feels well. Denies any pain. Wants to go home. States he doesn't want SNF. Appears slightly confused and got agitated when I suggested that he may not do well at home.   Objective: Filed Vitals:   08/01/13 0500  BP: 124/66  Pulse: 87  Temp: 98.8 F (37.1 C)  Resp: 18    Intake/Output Summary (Last 24 hours) at 08/01/13 0927 Last data filed at 08/01/13 0500  Gross per 24 hour  Intake    840 ml  Output   3800 ml  Net  -2960 ml   Filed Weights   07/28/13 2226 07/30/13 2137 07/31/13 2100  Weight: 135.7 kg (299 lb 2.6 oz) 131.135 kg (289 lb 1.6 oz) 126.962 kg (279 lb 14.4 oz)    Exam:   General:  AAOX3 (some intermittent episodes of confabulation), no fever.   Cardiovascular: S1 and S2, no rubs or gallops; 2+ edema bilaterally  Respiratory: no frank crackles at bases, improved air movement  Abdomen: distended, non tender; positive BS  Musculoskeletal: no cyanosis; pain with movement on left lower extremity   Data Reviewed: Basic Metabolic Panel:  Recent Labs Lab 07/27/13 0430 07/28/13 0600 07/29/13 0655 07/30/13 0515 08/01/13 0500  NA 138 140 142 140 138  K 3.6* 3.7 3.6* 3.6* 3.6*  CL 104 104 104 102 96  CO2 30 32 31 31 36*  GLUCOSE 172* 132* 148* 154* 121*  BUN 23 17 12 8 8   CREATININE 0.70 0.54 0.50 0.47* 0.59  CALCIUM 7.1* 8.0* 8.5 8.4 8.0*  MG  --   --  1.5  --   --   PHOS  --   --  1.7*  --   --    Liver Function Tests:  Recent Labs Lab 07/28/13 0600  AST 37  ALT 14  ALKPHOS 121*  BILITOT 5.8*  PROT 5.2*  ALBUMIN 2.2*   CBC:  Recent Labs Lab 07/28/13 0600 07/29/13 0655 07/30/13 0515 07/31/13 0445 08/01/13 0500  WBC 2.4* 2.9* 2.3* 3.0* 4.2  HGB 7.4* 8.6* 7.4* 7.8* 7.8*  HCT 22.3* 25.7* 22.6* 24.4* 24.6*  MCV 102.3* 103.6* 104.1* 108.0* 107.4*  PLT 30* 35* 8* 49* 49*   BNP (last 3 results)  Recent Labs  05/31/13 2330 06/22/13 1738 06/30/13 2225  PROBNP 304.0* 296.3*  669.2*   CBG:  Recent Labs Lab 07/31/13 0757 07/31/13 1139 07/31/13 1707 07/31/13 2144 08/01/13 0747  GLUCAP 124* 155* 162* 150* 109*    Recent Results (from the past 240 hour(s))  SURGICAL PCR SCREEN     Status: Abnormal   Collection Time    07/24/13 11:38 AM      Result Value Ref Range Status   MRSA, PCR NEGATIVE  NEGATIVE Final   Staphylococcus aureus POSITIVE (*) NEGATIVE Final   Comment:            The Xpert SA Assay (FDA     approved for NASAL specimens     in patients over 47 years of age),     is one component of     a comprehensive surveillance     program.  Test performance has     been validated by [employer] for patients greater     than or equal to 11 year old.     It is not intended     to diagnose infection nor to     guide or monitor treatment.  MRSA PCR SCREENING     Status: None   Collection Time    07/24/13  3:30 PM      Result Value Ref Range Status   MRSA by PCR NEGATIVE  NEGATIVE Final   Comment:            The GeneXpert MRSA Assay (FDA     approved for NASAL specimens     only), is one component of a     comprehensive MRSA colonization     surveillance program. It is not     intended to diagnose MRSA     infection nor to guide or     monitor treatment for     MRSA infections.  CLOSTRIDIUM DIFFICILE BY PCR     Status: None   Collection Time    07/30/13  9:29 AM      Result Value Ref Range Status   C difficile by pcr NEGATIVE  NEGATIVE Final     Studies: Ir Fluoro Guide Cv Line Right  07/30/2013   CLINICAL DATA:  Poor venous access, request for peripherally inserted central catheter.  EXAM: Right arm dual lumen power injectable PICC LINE PLACEMENT WITH ULTRASOUND AND FLUOROSCOPIC GUIDANCE  FLUOROSCOPY TIME:  18 seconds  PROCEDURE: The patient was advised of the possible risks and complications and agreed to undergo the procedure. The patient was then brought to the angiographic suite for the procedure.  The right arm was prepped with  chlorhexidine, draped in the usual sterile fashion using maximum barrier technique (cap and mask, sterile gown, sterile gloves, large sterile sheet, hand hygiene and cutaneous antisepsis) and infiltrated locally with 1% Lidocaine.  Ultrasound demonstrated patency of the right basilic vein, and this was documented with an image. Under real-time ultrasound guidance, this vein was accessed with a 21 gauge micropuncture needle and image documentation was performed. A 0.018  wire was introduced in to the vein. Over this, a 5 [place] dual lumen power-injectable PICC was advanced to the lower SVC/right atrial junction. Fluoroscopy during the procedure and fluoro spot radiograph confirms appropriate catheter position. The catheter was flushed and covered with a sterile dressing.  Catheter length:  41 cm  Complications: None.  IMPRESSION: Successful right arm Power injectable PICC line placement with ultrasound and fluoroscopic guidance. The catheter is ready for use.  Read By:  Tsosie Billing PA-C   Electronically Signed   By: Jacqulynn Cadet M.D.   On: 07/30/2013 14:27   Ir US Guide Vasc Access Right  07/30/2013   CLINICAL DATA:  Poor venous access, request for peripherally inserted central catheter.  EXAM: Right arm dual lumen power injectable PICC LINE PLACEMENT WITH ULTRASOUND AND FLUOROSCOPIC GUIDANCE  FLUOROSCOPY TIME:  18 seconds  PROCEDURE: The patient was advised of the possible risks and complications and agreed to undergo the procedure. The patient was then brought to the angiographic suite for the procedure.  The right arm was prepped with chlorhexidine, draped in the usual sterile fashion using maximum barrier technique (cap and mask, sterile gown, sterile gloves, large sterile sheet, hand hygiene and cutaneous antisepsis) and infiltrated locally with 1% Lidocaine.  Ultrasound demonstrated patency of the right basilic vein, and this was documented with an image. Under real-time ultrasound guidance, this vein  was accessed with a 21 gauge micropuncture needle and image documentation was performed. A 0.018 wire was introduced in to the vein. Over this, a 5 [place] dual lumen power-injectable PICC was advanced to the lower SVC/right atrial junction. Fluoroscopy during the procedure and fluoro spot radiograph confirms appropriate catheter position. The catheter was flushed and covered with a sterile dressing.  Catheter length:  41 cm  Complications: None.  IMPRESSION: Successful right arm Power injectable PICC line placement with ultrasound and fluoroscopic guidance. The catheter is ready for use.  Read By:  Tsosie Billing PA-C   Electronically Signed   By: Jacqulynn Cadet M.D.   On: 07/30/2013 14:27    Scheduled Meds: . feeding supplement (ENSURE COMPLETE)  237 mL Oral BID BM  . folic acid  1 mg Oral Daily  . furosemide  40 mg Intravenous Q12H  . insulin aspart  0-15 Units Subcutaneous TID WC  . iron polysaccharides  150 mg Oral BID  . lactulose  40 g Oral BID  . levETIRAcetam  1,000 mg Oral BID  . levothyroxine  75 mcg Oral QAC breakfast  . multivitamin with minerals  1 tablet Oral q morning - 10a  . pantoprazole  40 mg Oral Daily  . PARoxetine  20 mg Oral Daily  . potassium chloride  40 mEq Oral Daily  . rifaximin  550 mg Oral BID  . sodium chloride  10-40 mL Intracatheter Q12H  . spironolactone  25 mg Oral BID  . thiamine  100 mg Oral Daily   Continuous Infusions: . sodium chloride 10 mL/hr at 08/01/13 0700    Active Problems:   ETOH abuse   Hepatitis C   Cirrhosis   Seizure disorder   Hypothyroidism   Diabetes mellitus type 2, uncontrolled, with complications   Ascites   Chronic back pain   Fracture, intertrochanteric, left femur   Respiratory failure   Hypotension   Encephalopathy acute    Time spent: >30 minutes    [facility] Pager [phone].   If 7PM-7AM, please contact night-coverage at [email or web address], password [facility]  08/01/2013, 9:27 AM  LOS: 9  days

## 2013-08-01 NOTE — Consult Note (Signed)
[facility] Consult   Reason for Consult:  Capacity evaluation Referring Physician:  Dr. Jackqulyn Livings Hollibaugh is an 58 y.o. male. Total Time spent with patient: 45 minutes  Assessment: AXIS I:  Post Traumatic Stress Disorder, Substance Abuse and Substance Induced Mood Disorder AXIS II:  Deferred AXIS III:   Past Medical History  Diagnosis Date  . Cirrhosis of liver   . Hepatitis C   . ETOH abuse   . Hypothyroid   . Psychosis   . Bipolar disorder   . Seizures   . Arthritis     chronic back pain  . Coagulopathy onset prior to 2009  . Thrombocytopenia onsetprior to 2009  . Pancytopenia onset prior to 2009    with anemia, low platelets  . Hyponatremia onset prior to 2009  . Korsakoff psychosis   . H/O abdominal abscess 2012    abcess involving ventral hernia. drained 06/2010 by IR  . 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 Hepatic encephalopathy prior to 2009  . H/O renal failure   . Ascites 01/2013    too minor to tap on ultrasound  . Peripheral vascular disease   . GERD (gastroesophageal reflux disease)   . Hypothyroidism 06/02/2012  . Obesity   . Cellulitis and abscess of upper arm and forearm   . Chronic kidney disease, unspecified   . Type 2 diabetes mellitus with diabetic neuropathy   . Compression fracture of L3 lumbar vertebra 01/2013  . Hernia of abdominal wall 2012    multiple ventral hernias  . Anxiety   . Depression   . Fibromyalgia    AXIS IV:  other psychosocial or environmental problems, problems related to social environment and problems with primary support group AXIS V:  51-60 moderate symptoms  Plan:  Patient does not meet criteria for capacity to make his own medical decisions Recommend his family members to obtain medical care power of attorney Referred to psychiatric service services at appropriate documentation Recommend a skilled nursing facility for appropriate rehabilitation  treatment. No evidence of imminent risk to self or others at present.   Patient does not meet criteria for psychiatric inpatient admission.  Subjective:   Tony Boyd is a 57 y.o. male patient admitted with depression and fall.  HPI: Patient was seen, chart reviewed and case discussed with Dr. [place] Pink. 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 Patient has been receiving medication management from [facility] in [place]. Patient has no previous acute psychiatric hospitalization. Patient has limited cognition and has poor orientation, okay we'll concentration and language. Patient does not understand his current medical condition in full length and the necessary treatments recommendations. Patient has minimizing his capacity to be on his own and has a limited family support. Based on my evaluation today patient does not meet criteria for capacity to make his own medical decisions and required medical care power of attorney which can be assisted by psychiatric social service with the help of family members if needed. I have tried to reach family members especially his doctors without success. Social service also trying to reach without success and we will keep trying to reach the family for appropriate disposition plans.  Medical history: Tony Boyd is a 58 y.o. male With Past medical history of cirrhosis, anasarca, hypothyroidism, portal hypertension gastropathy. Presented to the hospital after mechanical fall involving  his walker. He fell on his left side and subsequently developed left lower extremity discomfort to was found by his daughter after being on floor for a prolonged period of time. Had discomfort when trying to bear weight on his left lower extremity. In the ED had x-ray which showed left intratrochanteric femur fracture. Also had chest x-ray  reporting either left contusion versus pneumonia. Felt to be left pulmonary contusion given history of recent fall as such antibiotics were not initiated.  Case was discussed with orthopedic surgeons recommended transitioning to [facility] for further evaluation and recommendations. We were consulted for further medical management   Review of Systems:  Constitutional:  No weight loss, night sweats, Fevers, chills, fatigue.  HEENT:  No headaches, Difficulty swallowing,Tooth/dental problems,Sore throat,  No sneezing, itching, ear ache, nasal congestion, post nasal drip,  Cardio-vascular:  No chest pain, Orthopnea, PND, swelling in lower extremities, anasarca, dizziness, palpitations  GI:  No heartburn, indigestion, abdominal pain, nausea, vomiting, + diarrhea, change in bowel habits, loss of appetite  Resp:  No shortness of breath with exertion or at rest. No excess mucus, no productive cough, No non-productive cough, No coughing up of blood.No change in color of mucus.No wheezing.No chest wall deformity  Skin:  no rash or lesions.  GU:  no dysuria, change in color of urine, no urgency or frequency. No flank pain.  Musculoskeletal:  No joint pain or swelling. + decreased range of motion. No back pain.  Psych:  No change in mood or affect. No depression or anxiety. No memory loss.   HPI Elements:   Location:  Depression, anxiety. Quality:  Poor. Severity:  Moderate. Timing:  No acute stressors except medical problems secondary to fall and being noncompliant with medications.  Past Psychiatric History: Past Medical History  Diagnosis Date  . Cirrhosis of liver   . Hepatitis C   . ETOH abuse   . Hypothyroid   . Psychosis   . Bipolar disorder   . Seizures   . Arthritis     chronic back pain  . Coagulopathy onset prior to 2009  . Thrombocytopenia onsetprior to 2009  . Pancytopenia onset prior to 2009    with anemia, low platelets  . Hyponatremia onset prior to 2009  . Korsakoff  psychosis   . H/O abdominal abscess 2012    abcess involving ventral hernia. drained 06/2010 by IR  . 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 Hepatic encephalopathy prior to 2009  . H/O renal failure   . Ascites 01/2013    too minor to tap on ultrasound  . Peripheral vascular disease   . GERD (gastroesophageal reflux disease)   . Hypothyroidism 06/02/2012  . Obesity   . Cellulitis and abscess of upper arm and forearm   . Chronic kidney disease, unspecified   . Type 2 diabetes mellitus with diabetic neuropathy   . Compression fracture of L3 lumbar vertebra 01/2013  . Hernia of abdominal wall 2012    multiple ventral hernias  . Anxiety   . Depression   . Fibromyalgia     reports that he quit smoking about 5 years ago. His smoking use included Cigarettes. He smoked 0.00 packs per day. He has never used smokeless tobacco. He reports that he drinks about .6 ounces of alcohol per week. He reports that he does not use illicit drugs. Family History  Problem Relation Age of Onset  . Heart disease Mother  MI  . Lung cancer Brother      Living Arrangements: Alone   Abuse/Neglect [facility]) Physical Abuse: Denies Verbal Abuse: Denies Sexual Abuse: Denies Allergies:   Allergies  Allergen Reactions  . Ativan [Lorazepam] Other (See Comments)    Makes patient very weak and cant mentate appropriately  . Tylenol [Acetaminophen] Other (See Comments)    Aggravates liver problems  . Droperidol Hives  . Penicillins Swelling  . Toradol [Ketorolac Tromethamine] Hives    ACT Assessment Complete:  No Objective: Blood pressure 110/52, pulse 87, temperature 98.9 F (37.2 C), temperature source Oral, resp. rate 20, height 5' 9"  (1.753 m), weight 126.962 kg (279 lb 14.4 oz), SpO2 95.00%.Body mass index is 41.32 kg/(m^2). Results for orders placed during the hospital encounter of 07/23/13 (from the past 72 hour(s))  GLUCOSE, CAPILLARY     Status: Abnormal    Collection Time    07/29/13  5:30 PM      Result Value Ref Range   Glucose-Capillary 189 (*) 70 - 99 mg/dL  GLUCOSE, CAPILLARY     Status: Abnormal   Collection Time    07/29/13 10:04 PM      Result Value Ref Range   Glucose-Capillary 157 (*) 70 - 99 mg/dL  CBC     Status: Abnormal   Collection Time    07/30/13  5:15 AM      Result Value Ref Range   WBC 2.3 (*) 4.0 - 10.5 K/uL   RBC 2.17 (*) 4.22 - 5.81 MIL/uL   Hemoglobin 7.4 (*) 13.0 - 17.0 g/dL   HCT 22.6 (*) 39.0 - 52.0 %   MCV 104.1 (*) 78.0 - 100.0 fL   MCH 34.1 (*) 26.0 - 34.0 pg   MCHC 32.7  30.0 - 36.0 g/dL   RDW 21.3 (*) 11.5 - 15.5 %   Platelets 8 (*) 150 - 400 K/uL   Comment: REPEATED TO VERIFY     PLATELET COUNT CONFIRMED BY SMEAR     CRITICAL RESULT CALLED TO, READ BACK BY AND VERIFIED WITH:     Pollie Meyer RN [id number] 07/30/2013 BY MACEDA, J  BASIC METABOLIC PANEL     Status: Abnormal   Collection Time    07/30/13  5:15 AM      Result Value Ref Range   Sodium 140  137 - 147 mEq/L   Potassium 3.6 (*) 3.7 - 5.3 mEq/L   Chloride 102  96 - 112 mEq/L   CO2 31  19 - 32 mEq/L   Glucose, Bld 154 (*) 70 - 99 mg/dL   BUN 8  6 - 23 mg/dL   Creatinine, Ser 0.47 (*) 0.50 - 1.35 mg/dL   Calcium 8.4  8.4 - 10.5 mg/dL   GFR calc non Af Amer >90  >90 mL/min   GFR calc Af Amer >90  >90 mL/min   Comment: (NOTE)     The eGFR has been calculated using the CKD EPI equation.     This calculation has not been validated in all clinical situations.     eGFR's persistently <90 mL/min signify possible Chronic Kidney     Disease.  GLUCOSE, CAPILLARY     Status: Abnormal   Collection Time    07/30/13  7:30 AM      Result Value Ref Range   Glucose-Capillary 136 (*) 70 - 99 mg/dL  PREPARE PLATELET PHERESIS     Status: None   Collection Time    07/30/13  8:37 AM  Result Value Ref Range   Unit Number [id number]     Blood Component Type PLTPHER LR2     Unit division 00     Status of Unit REL FROM [facility]     Transfusion Status OK  TO TRANSFUSE     Unit Number [id number]     Blood Component Type PLTPHER LR2     Unit division 00     Status of Unit ISSUED,FINAL     Transfusion Status OK TO TRANSFUSE    CLOSTRIDIUM DIFFICILE BY PCR     Status: None   Collection Time    07/30/13  9:29 AM      Result Value Ref Range   C difficile by pcr NEGATIVE  NEGATIVE  GLUCOSE, CAPILLARY     Status: Abnormal   Collection Time    07/30/13 11:32 AM      Result Value Ref Range   Glucose-Capillary 125 (*) 70 - 99 mg/dL  GLUCOSE, CAPILLARY     Status: Abnormal   Collection Time    07/30/13  5:38 PM      Result Value Ref Range   Glucose-Capillary 194 (*) 70 - 99 mg/dL  GLUCOSE, CAPILLARY     Status: Abnormal   Collection Time    07/30/13  9:36 PM      Result Value Ref Range   Glucose-Capillary 164 (*) 70 - 99 mg/dL  CBC     Status: Abnormal   Collection Time    07/31/13  4:45 AM      Result Value Ref Range   WBC 3.0 (*) 4.0 - 10.5 K/uL   RBC 2.26 (*) 4.22 - 5.81 MIL/uL   Hemoglobin 7.8 (*) 13.0 - 17.0 g/dL   Comment: CONSISTENT WITH PREVIOUS RESULT   HCT 24.4 (*) 39.0 - 52.0 %   MCV 108.0 (*) 78.0 - 100.0 fL   MCH 34.5 (*) 26.0 - 34.0 pg   MCHC 32.0  30.0 - 36.0 g/dL   RDW 22.0 (*) 11.5 - 15.5 %   Platelets 49 (*) 150 - 400 K/uL   Comment: REPEATED TO VERIFY     CONSISTENT WITH PREVIOUS RESULT  GLUCOSE, CAPILLARY     Status: Abnormal   Collection Time    07/31/13  7:57 AM      Result Value Ref Range   Glucose-Capillary 124 (*) 70 - 99 mg/dL  GLUCOSE, CAPILLARY     Status: Abnormal   Collection Time    07/31/13 11:39 AM      Result Value Ref Range   Glucose-Capillary 155 (*) 70 - 99 mg/dL  GLUCOSE, CAPILLARY     Status: Abnormal   Collection Time    07/31/13  5:07 PM      Result Value Ref Range   Glucose-Capillary 162 (*) 70 - 99 mg/dL  GLUCOSE, CAPILLARY     Status: Abnormal   Collection Time    07/31/13  9:44 PM      Result Value Ref Range   Glucose-Capillary 150 (*) 70 - 99 mg/dL  CBC     Status:  Abnormal   Collection Time    08/01/13  5:00 AM      Result Value Ref Range   WBC 4.2  4.0 - 10.5 K/uL   RBC 2.29 (*) 4.22 - 5.81 MIL/uL   Hemoglobin 7.8 (*) 13.0 - 17.0 g/dL   HCT 24.6 (*) 39.0 - 52.0 %   MCV 107.4 (*) 78.0 - 100.0 fL   MCH 34.1 (*)  26.0 - 34.0 pg   MCHC 31.7  30.0 - 36.0 g/dL   RDW 22.1 (*) 11.5 - 15.5 %   Platelets 49 (*) 150 - 400 K/uL   Comment: CONSISTENT WITH PREVIOUS RESULT  BASIC METABOLIC PANEL     Status: Abnormal   Collection Time    08/01/13  5:00 AM      Result Value Ref Range   Sodium 138  137 - 147 mEq/L   Potassium 3.6 (*) 3.7 - 5.3 mEq/L   Chloride 96  96 - 112 mEq/L   CO2 36 (*) 19 - 32 mEq/L   Glucose, Bld 121 (*) 70 - 99 mg/dL   BUN 8  6 - 23 mg/dL   Creatinine, Ser 0.59  0.50 - 1.35 mg/dL   Calcium 8.0 (*) 8.4 - 10.5 mg/dL   GFR calc non Af Amer >90  >90 mL/min   GFR calc Af Amer >90  >90 mL/min   Comment: (NOTE)     The eGFR has been calculated using the CKD EPI equation.     This calculation has not been validated in all clinical situations.     eGFR's persistently <90 mL/min signify possible Chronic Kidney     Disease.  GLUCOSE, CAPILLARY     Status: Abnormal   Collection Time    08/01/13  7:47 AM      Result Value Ref Range   Glucose-Capillary 109 (*) 70 - 99 mg/dL  HEPATIC FUNCTION PANEL     Status: Abnormal   Collection Time    08/01/13 11:25 AM      Result Value Ref Range   Total Protein 5.5 (*) 6.0 - 8.3 g/dL   Albumin 2.4 (*) 3.5 - 5.2 g/dL   AST 30  0 - 37 U/L   ALT 13  0 - 53 U/L   Alkaline Phosphatase 126 (*) 39 - 117 U/L   Total Bilirubin 7.5 (*) 0.3 - 1.2 mg/dL   Bilirubin, Direct 2.8 (*) 0.0 - 0.3 mg/dL   Indirect Bilirubin 4.7 (*) 0.3 - 0.9 mg/dL  AMMONIA     Status: None   Collection Time    08/01/13 11:25 AM      Result Value Ref Range   Ammonia 38  11 - 60 umol/L   Labs are reviewed and are pertinent for .  Current Facility-Administered Medications  Medication Dose Route Frequency Provider Last Rate  Last Dose  . 0.9 %  sodium chloride infusion   Intravenous Continuous Wilhelmina Mcardle, MD 10 mL/hr at 08/01/13 0700    . albuterol (PROVENTIL) (2.5 MG/3ML) 0.083% nebulizer solution 2.5 mg  2.5 mg Inhalation Q2H PRN Chesley Mires, MD      . feeding supplement (ENSURE COMPLETE) (ENSURE COMPLETE) liquid 237 mL  237 mL Oral BID BM Heather Cornelison Pitts, RD   237 mL at 08/01/13 1000  . folic acid (FOLVITE) tablet 1 mg  1 mg Oral Daily Doree Fudge, MD   1 mg at 08/01/13 1100  . furosemide (LASIX) injection 40 mg  40 mg Intravenous Q12H Bonnielee Haff, MD      . insulin aspart (novoLOG) injection 0-15 Units  0-15 Units Subcutaneous TID WC Chesley Mires, MD   3 Units at 07/31/13 1732  . iron polysaccharides (NIFEREX) capsule 150 mg  150 mg Oral BID Barton Dubois, MD   150 mg at 08/01/13 1100  . lactulose (CHRONULAC) 10 GM/15ML solution 40 g  40 g Oral BID Chesley Mires, MD  40 g at 08/01/13 1100  . levETIRAcetam (KEPPRA) tablet 1,000 mg  1,000 mg Oral BID Doree Fudge, MD   1,000 mg at 08/01/13 1100  . levothyroxine (SYNTHROID, LEVOTHROID) tablet 75 mcg  75 mcg Oral QAC breakfast Velvet Bathe, MD   75 mcg at 08/01/13 0900  . multivitamin with minerals tablet 1 tablet  1 tablet Oral q morning - 10a Velvet Bathe, MD   1 tablet at 08/01/13 1100  . ondansetron (ZOFRAN) injection 4 mg  4 mg Intravenous Q6H PRN Naiping Eduard Roux, MD      . oxyCODONE (Oxy IR/ROXICODONE) immediate release tablet 20 mg  20 mg Oral Q6H PRN Barton Dubois, MD   20 mg at 08/01/13 1131  . pantoprazole (PROTONIX) EC tablet 40 mg  40 mg Oral Daily Chesley Mires, MD   40 mg at 07/31/13 1032  . PARoxetine (PAXIL) tablet 20 mg  20 mg Oral Daily Velvet Bathe, MD   20 mg at 08/01/13 1100  . potassium chloride SA (K-DUR,KLOR-CON) CR tablet 40 mEq  40 mEq Oral Daily Barton Dubois, MD   40 mEq at 08/01/13 1100  . rifaximin (XIFAXAN) tablet 550 mg  550 mg Oral BID Velvet Bathe, MD   550 mg at 08/01/13 1100  . sodium chloride  0.9 % injection 10-40 mL  10-40 mL Intracatheter Q12H Barton Dubois, MD      . sodium chloride 0.9 % injection 10-40 mL  10-40 mL Intracatheter PRN Barton Dubois, MD   20 mL at 08/01/13 1124  . spironolactone (ALDACTONE) tablet 25 mg  25 mg Oral BID Barton Dubois, MD   25 mg at 08/01/13 0900  . thiamine (VITAMIN B-1) tablet 100 mg  100 mg Oral Daily Doree Fudge, MD   100 mg at 08/01/13 1100    Psychiatric Specialty Exam: Physical Exam  ROS  Blood pressure 110/52, pulse 87, temperature 98.9 F (37.2 C), temperature source Oral, resp. rate 20, height 5' 9"  (1.753 m), weight 126.962 kg (279 lb 14.4 oz), SpO2 95.00%.Body mass index is 41.32 kg/(m^2).  General Appearance: Casual  Eye Contact::  Good  Speech:  Clear and Coherent  Volume:  Normal  Mood:  Anxious  Affect:  Depressed  Thought Process:  Coherent and Goal Directed  Orientation:  Full (Time, Place, and Person)  Thought Content:  WDL  Suicidal Thoughts:  No  Homicidal Thoughts:  No  Memory:  Immediate;   Good  Judgement:  Impaired  Insight:  Lacking  Psychomotor Activity:  Psychomotor Retardation  Concentration:  Fair  Recall:  [place] of Knowledge:Fair  Language: Good  Akathisia:  NA  Handed:  Right  AIMS (if indicated):     Assets:  Communication Skills Desire for Improvement Financial Resources/Insurance Housing Intimacy Leisure Time Resilience Social Support Talents/Skills  Sleep:      Musculoskeletal: Strength & Muscle Tone: within normal limits Gait & Station: normal Patient leans: Right  Treatment Plan Summary: Daily contact with patient to assess and evaluate symptoms and progress in treatment Medication management  Durward Parcel 08/01/2013 12:57 PM

## 2013-08-02 LAB — CBC
HEMATOCRIT: 24.8 % — AB (ref 39.0–52.0)
HEMOGLOBIN: 7.9 g/dL — AB (ref 13.0–17.0)
MCH: 34.1 pg — ABNORMAL HIGH (ref 26.0–34.0)
MCHC: 31.9 g/dL (ref 30.0–36.0)
MCV: 106.9 fL — ABNORMAL HIGH (ref 78.0–100.0)
Platelets: 49 10*3/uL — ABNORMAL LOW (ref 150–400)
RBC: 2.32 MIL/uL — AB (ref 4.22–5.81)
RDW: 22.1 % — ABNORMAL HIGH (ref 11.5–15.5)
WBC: 4.1 10*3/uL (ref 4.0–10.5)

## 2013-08-02 LAB — COMPREHENSIVE METABOLIC PANEL
ALK PHOS: 133 U/L — AB (ref 39–117)
ALT: 12 U/L (ref 0–53)
AST: 35 U/L (ref 0–37)
Albumin: 2.4 g/dL — ABNORMAL LOW (ref 3.5–5.2)
BUN: 9 mg/dL (ref 6–23)
CO2: 33 meq/L — AB (ref 19–32)
Calcium: 7.9 mg/dL — ABNORMAL LOW (ref 8.4–10.5)
Chloride: 97 mEq/L (ref 96–112)
Creatinine, Ser: 0.63 mg/dL (ref 0.50–1.35)
GLUCOSE: 149 mg/dL — AB (ref 70–99)
POTASSIUM: 3.8 meq/L (ref 3.7–5.3)
SODIUM: 139 meq/L (ref 137–147)
Total Bilirubin: 7.1 mg/dL — ABNORMAL HIGH (ref 0.3–1.2)
Total Protein: 5.6 g/dL — ABNORMAL LOW (ref 6.0–8.3)

## 2013-08-02 LAB — GLUCOSE, CAPILLARY
GLUCOSE-CAPILLARY: 126 mg/dL — AB (ref 70–99)
GLUCOSE-CAPILLARY: 141 mg/dL — AB (ref 70–99)
Glucose-Capillary: 222 mg/dL — ABNORMAL HIGH (ref 70–99)
Glucose-Capillary: 135 mg/dL — ABNORMAL HIGH (ref 70–99)

## 2013-08-02 MED ORDER — OXYCODONE HCL 5 MG PO TABS
20.0000 mg | ORAL_TABLET | ORAL | Status: DC | PRN
  Administered 2013-08-02 – 2013-08-07 (×21): 20 mg via ORAL
  Filled 2013-08-02 (×21): qty 4

## 2013-08-02 MED ORDER — ALPRAZOLAM 0.5 MG PO TABS
0.5000 mg | ORAL_TABLET | Freq: Once | ORAL | Status: AC
  Administered 2013-08-02: 0.5 mg via ORAL
  Filled 2013-08-02: qty 1

## 2013-08-02 MED ORDER — ZOLPIDEM TARTRATE 5 MG PO TABS
5.0000 mg | ORAL_TABLET | Freq: Every evening | ORAL | Status: DC | PRN
  Administered 2013-08-02: 5 mg via ORAL
  Filled 2013-08-02: qty 1

## 2013-08-02 NOTE — Progress Notes (Signed)
[facility] PROGRESS NOTE  Tony Boyd [id number] DOB: September 26, 1955 DOA: 07/23/2013  PCP: Hollace Kinnier, DO  Assessment/Plan: 1-left intertrochanteric fracture: s/p nailing by orthopedic surgery. -slowly progressing -will need SNF for rehabilitation but patient states he wants to go home. Psyche has determined that he does not have capacity. CSW to pursue this further.  -patient has not been accepted by [facility] (lack of support at discharge)  2-ABLA: s/p transfusion (FFP and PRBC's) -Hgb continues to be stable -will follow trend and transfuse further as needed (if Hgb less than 7)  3-Cirrhosis/ESLD -with anasarca and fluid overload -He is diuresing well. Continue lasix IV but reduce to q12h. Continue spironolactone -follow VS and volume status -continue rifaximin and lactulose. Ammonia was normal.  4-ETOH abuse: -cessation counseling provided -continue CIWA, thiamine and folic acid  5-GERD:continue PPI  6-thrombocytopenia: due to cirrhosis  -will continue SCD's and avoid heparin products -Recd one unit of platelets on 6/4 -Counts stable.  7-DM type 2: -continue SSI -follow CBG's now that patient is eating better and adjust hypoglycemic regimen as needed  8-hypothyroidism: -continue synthroid  9-Hx of seizure: no seizure activity appreciated -continue keppra  10-depression/anxiety: continue risperdal and paxil -Psyche has determined that patient does not have capacity to make medical decisions. Have been attempting to reach family without any luck. CSW to assist with further management of this issue.  11-resp failure and hypotension: -resolved now -continue flutter valve/IS and PRN oxygen supplementation.  10-chronic pain: will continue current PO analgesic regimen with oxycodone 20mg  Q6HPRN  DVT Prophylaxis: SCD's only due to severe thrombocytopenia Code Status: Full Family Communication: no family at bedside Disposition Plan: SNF when medically stable but  patient is refusing. But he lacks capacity per Psyche. Will need to discuss with CSW.    Consultants:  Orthopedic service  PCCM  Procedures:  Left hip intertrochanteric nailing   PICC line  Antibiotics:  None   HPI/Subjective: Patient denies any complaints. Still wants to go home. Not willing to engage about possible risks associated if he goes home.   Objective: Filed Vitals:   08/02/13 1032  BP: 128/66  Pulse: 84  Temp: 98.6 F (37 C)  Resp: 19    Intake/Output Summary (Last 24 hours) at 08/02/13 1135 Last data filed at 08/02/13 4818  Gross per 24 hour  Intake    360 ml  Output    800 ml  Net   -440 ml   Filed Weights   07/30/13 2137 07/31/13 2100 08/01/13 2126  Weight: 131.135 kg (289 lb 1.6 oz) 126.962 kg (279 lb 14.4 oz) 123.333 kg (271 lb 14.4 oz)    Exam:   General:  AAOX3 (some intermittent episodes of confabulation), no fever.   Cardiovascular: S1 and S2, no rubs or gallops; 2+ edema bilaterally  Respiratory: no frank crackles at bases, improved air movement  Abdomen: distended, non tender; positive BS  Musculoskeletal: no cyanosis; pain with movement on left lower extremity   Data Reviewed: Basic Metabolic Panel:  Recent Labs Lab 07/28/13 0600 07/29/13 0655 07/30/13 0515 08/01/13 0500 08/02/13 0410  NA 140 142 140 138 139  K 3.7 3.6* 3.6* 3.6* 3.8  CL 104 104 102 96 97  CO2 32 31 31 36* 33*  GLUCOSE 132* 148* 154* 121* 149*  BUN 17 12 8 8 9   CREATININE 0.54 0.50 0.47* 0.59 0.63  CALCIUM 8.0* 8.5 8.4 8.0* 7.9*  MG  --  1.5  --   --   --   PHOS  --  1.7*  --   --   --    Liver Function Tests:  Recent Labs Lab 07/28/13 0600 08/01/13 1125 08/02/13 0410  AST 37 30 35  ALT 14 13 12   ALKPHOS 121* 126* 133*  BILITOT 5.8* 7.5* 7.1*  PROT 5.2* 5.5* 5.6*  ALBUMIN 2.2* 2.4* 2.4*   CBC:  Recent Labs Lab 07/29/13 0655 07/30/13 0515 07/31/13 0445 08/01/13 0500 08/02/13 0410  WBC 2.9* 2.3* 3.0* 4.2 4.1  HGB 8.6* 7.4* 7.8*  7.8* 7.9*  HCT 25.7* 22.6* 24.4* 24.6* 24.8*  MCV 103.6* 104.1* 108.0* 107.4* 106.9*  PLT 35* 8* 49* 49* 49*   BNP (last 3 results)  Recent Labs  05/31/13 2330 06/22/13 1738 06/30/13 2225  PROBNP 304.0* 296.3* 669.2*   CBG:  Recent Labs Lab 08/01/13 0747 08/01/13 1307 08/01/13 1650 08/01/13 2122 08/02/13 0800  GLUCAP 109* 125* 152* 169* 126*    Recent Results (from the past 240 hour(s))  SURGICAL PCR SCREEN     Status: Abnormal   Collection Time    07/24/13 11:38 AM      Result Value Ref Range Status   MRSA, PCR NEGATIVE  NEGATIVE Final   Staphylococcus aureus POSITIVE (*) NEGATIVE Final   Comment:            The Xpert SA Assay (FDA     approved for NASAL specimens     in patients over 47 years of age),     is one component of     a comprehensive surveillance     program.  Test performance has     been validated by [employer] for patients greater     than or equal to 6 year old.     It is not intended     to diagnose infection nor to     guide or monitor treatment.  MRSA PCR SCREENING     Status: None   Collection Time    07/24/13  3:30 PM      Result Value Ref Range Status   MRSA by PCR NEGATIVE  NEGATIVE Final   Comment:            The GeneXpert MRSA Assay (FDA     approved for NASAL specimens     only), is one component of a     comprehensive MRSA colonization     surveillance program. It is not     intended to diagnose MRSA     infection nor to guide or     monitor treatment for     MRSA infections.  CLOSTRIDIUM DIFFICILE BY PCR     Status: None   Collection Time    07/30/13  9:29 AM      Result Value Ref Range Status   C difficile by pcr NEGATIVE  NEGATIVE Final     Studies: No results found.  Scheduled Meds: . feeding supplement (ENSURE COMPLETE)  237 mL Oral BID BM  . folic acid  1 mg Oral Daily  . furosemide  40 mg Intravenous Q12H  . insulin aspart  0-15 Units Subcutaneous TID WC  . iron polysaccharides  150 mg Oral BID  .  lactulose  40 g Oral BID  . levETIRAcetam  1,000 mg Oral BID  . levothyroxine  75 mcg Oral QAC breakfast  . multivitamin with minerals  1 tablet Oral q morning - 10a  . pantoprazole  40 mg Oral Daily  . PARoxetine  20 mg Oral  Daily  . potassium chloride  40 mEq Oral Daily  . rifaximin  550 mg Oral BID  . sodium chloride  10-40 mL Intracatheter Q12H  . spironolactone  25 mg Oral BID  . thiamine  100 mg Oral Daily   Continuous Infusions: . sodium chloride 10 mL/hr at 08/01/13 0700    Active Problems:   ETOH abuse   Hepatitis C   Cirrhosis   Seizure disorder   Hypothyroidism   Diabetes mellitus type 2, uncontrolled, with complications   Ascites   Chronic back pain   Fracture, intertrochanteric, left femur   Respiratory failure   Hypotension   Encephalopathy acute    Time spent: >30 minutes    [facility] Pager [phone].   If 7PM-7AM, please contact night-coverage at [email or web address], password [facility]  08/02/2013, 11:35 AM  LOS: 10 days

## 2013-08-02 NOTE — Progress Notes (Signed)
Subjective: 9 Days Post-Op Procedure(s) (LRB): INTRAMEDULLARY (IM) NAIL INTERTROCHANTERIC (Left) Patient reports pain as mild.    Objective: Vital signs in last 24 hours: Temp:  [97.9 F (36.6 C)-99.4 F (37.4 C)] 97.9 F (36.6 C) (06/07 0453) Pulse Rate:  [84-93] 93 (06/07 0453) Resp:  [18-20] 18 (06/07 0453) BP: (102-118)/(52-64) 102/64 mmHg (06/07 0453) SpO2:  [87 %-96 %] 87 % (06/07 0453) Weight:  [123.333 kg (271 lb 14.4 oz)] 123.333 kg (271 lb 14.4 oz) (06/06 2126)  Intake/Output from previous day: 06/06 0701 - 06/07 0700 In: 620 [P.O.:600; I.V.:20] Out: 1525 [Urine:1525] Intake/Output this shift: Total I/O In: -  Out: 800 [Urine:800]   Recent Labs  07/31/13 0445 08/01/13 0500 08/02/13 0410  HGB 7.8* 7.8* 7.9*    Recent Labs  08/01/13 0500 08/02/13 0410  WBC 4.2 4.1  RBC 2.29* 2.32*  HCT 24.6* 24.8*  PLT 49* 49*    Recent Labs  08/01/13 0500 08/02/13 0410  NA 138 139  K 3.6* 3.8  CL 96 97  CO2 36* 33*  BUN 8 9  CREATININE 0.59 0.63  GLUCOSE 121* 149*  CALCIUM 8.0* 7.9*   No results found for this basename: LABPT, INR,  in the last 72 hours  Neurologically intact  Dressing dry.   Assessment/Plan: 9 Days Post-Op Procedure(s) (LRB): INTRAMEDULLARY (IM) NAIL INTERTROCHANTERIC (Left) Discharge to SNF  hgb stable.   Tony Boyd 08/02/2013, 7:54 AM

## 2013-08-02 NOTE — Plan of Care (Cosign Needed)
Pt c/o inadequately controlled pain in left hip- recent repair fx- also requesting Xanax since "allergic" to Ativan- psych notes reviewed- per RN pt was given 1x dose Xanax last pm and became very confused/pulled off clothing- opted to increase frequency of current Oxy IR dose and use Ambien low dose since seems to not tolerate BZDs  Erin Hearing, ANP

## 2013-08-03 LAB — CBC
HEMATOCRIT: 25.1 % — AB (ref 39.0–52.0)
Hemoglobin: 8.1 g/dL — ABNORMAL LOW (ref 13.0–17.0)
MCH: 34.5 pg — AB (ref 26.0–34.0)
MCHC: 32.3 g/dL (ref 30.0–36.0)
MCV: 106.8 fL — ABNORMAL HIGH (ref 78.0–100.0)
Platelets: 49 10*3/uL — ABNORMAL LOW (ref 150–400)
RBC: 2.35 MIL/uL — ABNORMAL LOW (ref 4.22–5.81)
RDW: 21.8 % — ABNORMAL HIGH (ref 11.5–15.5)
WBC: 5 10*3/uL (ref 4.0–10.5)

## 2013-08-03 LAB — GLUCOSE, CAPILLARY
GLUCOSE-CAPILLARY: 202 mg/dL — AB (ref 70–99)
GLUCOSE-CAPILLARY: 146 mg/dL — AB (ref 70–99)
Glucose-Capillary: 111 mg/dL — ABNORMAL HIGH (ref 70–99)
Glucose-Capillary: 197 mg/dL — ABNORMAL HIGH (ref 70–99)
Glucose-Capillary: 176 mg/dL — ABNORMAL HIGH (ref 70–99)

## 2013-08-03 LAB — BASIC METABOLIC PANEL
BUN: 8 mg/dL (ref 6–23)
CO2: 35 mEq/L — ABNORMAL HIGH (ref 19–32)
Calcium: 7.9 mg/dL — ABNORMAL LOW (ref 8.4–10.5)
Chloride: 97 mEq/L (ref 96–112)
Creatinine, Ser: 0.55 mg/dL (ref 0.50–1.35)
GFR calc Af Amer: >90 mL/min (ref >90)
GLUCOSE: 113 mg/dL — AB (ref 70–99)
Potassium: 3.7 mEq/L (ref 3.7–5.3)
Sodium: 138 mEq/L (ref 137–147)

## 2013-08-03 NOTE — Progress Notes (Signed)
[facility] PROGRESS NOTE  Tony Boyd FFM:[id number] DOB: May 28, 1955 DOA: 07/23/2013  PCP: Hollace Kinnier, DO  Assessment/Plan: 1-left intertrochanteric fracture: s/p nailing by orthopedic surgery. -slowly progressing -will need SNF for rehabilitation but patient states he wants to go home. Psyche has determined that he does not have capacity. CSW to pursue this further.  -patient has not been accepted by CIR (lack of support at discharge)  2-ABLA: s/p transfusion (FFP and PRBC's) -Hgb continues to be stable -will follow trend and transfuse further as needed (if Hgb less than 7)  3-Cirrhosis/ESLD -with anasarca and fluid overload -He is diuresing well. Continue lasix IV at current dose. Continue spironolactone -follow VS and volume status -continue rifaximin and lactulose. Ammonia was normal.  4-ETOH abuse: -cessation counseling provided -continue CIWA, thiamine and folic acid  5-GERD:continue PPI  6-thrombocytopenia: due to cirrhosis  -will continue SCD's and avoid heparin products -Recd one unit of platelets on 6/4 -Counts stable.  7-DM type 2: -continue SSI -follow CBG's now that patient is eating better and adjust hypoglycemic regimen as needed  8-hypothyroidism: -continue synthroid  9-Hx of seizure: no seizure activity appreciated -continue keppra  10-depression/anxiety: continue risperdal and paxil -Psyche has determined that patient does not have capacity to make medical decisions. Have been attempting to reach family without any luck. CSW to assist with further management of this issue.  -Experiencing side effects with ambien. Will stop.  11-resp failure and hypotension: -resolved now -continue flutter valve/IS and PRN oxygen supplementation.  10-chronic pain: will continue current PO analgesic regimen with oxycodone 20mg  Q6HPRN  DVT Prophylaxis: SCD's only, due to severe thrombocytopenia Code Status: Full Family Communication: no family at  bedside Disposition Plan: SNF when medically stable but patient is refusing. But he lacks capacity per Psyche. CSW aware.    Consultants:  Orthopedic service  PCCM  Procedures:  Left hip intertrochanteric nailing   PICC line  Antibiotics:  None   HPI/Subjective: Patient denies any complaints. Restless overnight. Still not interested in SNF.  Objective: Filed Vitals:   08/03/13 1000  BP: 118/66  Pulse: 80  Temp: 98.4 F (36.9 C)  Resp: 20    Intake/Output Summary (Last 24 hours) at 08/03/13 1444 Last data filed at 08/03/13 1218  Gross per 24 hour  Intake   1280 ml  Output   4800 ml  Net  -3520 ml   Filed Weights   07/31/13 2100 08/01/13 2126 08/02/13 2226  Weight: 126.962 kg (279 lb 14.4 oz) 123.333 kg (271 lb 14.4 oz) 124.785 kg (275 lb 1.6 oz)    Exam:   General:  No distress. distracted at times  Cardiovascular: S1 and S2, no rubs or gallops; 2+ edema bilaterally  Respiratory: improved air movement  Abdomen: distended, non tender; positive BS  Musculoskeletal: no cyanosis; pain with movement on left lower extremity   Data Reviewed: Basic Metabolic Panel:  Recent Labs Lab 07/29/13 0655 07/30/13 0515 08/01/13 0500 08/02/13 0410 08/03/13 0450  NA 142 140 138 139 138  K 3.6* 3.6* 3.6* 3.8 3.7  CL 104 102 96 97 97  CO2 31 31 36* 33* 35*  GLUCOSE 148* 154* 121* 149* 113*  BUN 12 8 8 9 8   CREATININE 0.50 0.47* 0.59 0.63 0.55  CALCIUM 8.5 8.4 8.0* 7.9* 7.9*  MG 1.5  --   --   --   --   PHOS 1.7*  --   --   --   --    Liver Function Tests:  Recent Labs  Lab 07/28/13 0600 08/01/13 1125 08/02/13 0410  AST 37 30 35  ALT 14 13 12   ALKPHOS 121* 126* 133*  BILITOT 5.8* 7.5* 7.1*  PROT 5.2* 5.5* 5.6*  ALBUMIN 2.2* 2.4* 2.4*   CBC:  Recent Labs Lab 07/30/13 0515 07/31/13 0445 08/01/13 0500 08/02/13 0410 08/03/13 0450  WBC 2.3* 3.0* 4.2 4.1 5.0  HGB 7.4* 7.8* 7.8* 7.9* 8.1*  HCT 22.6* 24.4* 24.6* 24.8* 25.1*  MCV 104.1* 108.0*  107.4* 106.9* 106.8*  PLT 8* 49* 49* 49* 49*   BNP (last 3 results)  Recent Labs  05/31/13 2330 06/22/13 1738 06/30/13 2225  PROBNP 304.0* 296.3* 669.2*   CBG:  Recent Labs Lab 08/02/13 1611 08/02/13 2224 08/03/13 0739 08/03/13 1146 08/03/13 1226  GLUCAP 222* 135* 111* 197* 202*    Recent Results (from the past 240 hour(s))  MRSA PCR SCREENING     Status: None   Collection Time    07/24/13  3:30 PM      Result Value Ref Range Status   MRSA by PCR NEGATIVE  NEGATIVE Final   Comment:            The GeneXpert MRSA Assay (FDA     approved for NASAL specimens     only), is one component of a     comprehensive MRSA colonization     surveillance program. It is not     intended to diagnose MRSA     infection nor to guide or     monitor treatment for     MRSA infections.  CLOSTRIDIUM DIFFICILE BY PCR     Status: None   Collection Time    07/30/13  9:29 AM      Result Value Ref Range Status   C difficile by pcr NEGATIVE  NEGATIVE Final     Studies: No results found.  Scheduled Meds: . feeding supplement (ENSURE COMPLETE)  237 mL Oral BID BM  . folic acid  1 mg Oral Daily  . furosemide  40 mg Intravenous Q12H  . insulin aspart  0-15 Units Subcutaneous TID WC  . iron polysaccharides  150 mg Oral BID  . lactulose  40 g Oral BID  . levETIRAcetam  1,000 mg Oral BID  . levothyroxine  75 mcg Oral QAC breakfast  . multivitamin with minerals  1 tablet Oral q morning - 10a  . pantoprazole  40 mg Oral Daily  . PARoxetine  20 mg Oral Daily  . potassium chloride  40 mEq Oral Daily  . rifaximin  550 mg Oral BID  . sodium chloride  10-40 mL Intracatheter Q12H  . spironolactone  25 mg Oral BID  . thiamine  100 mg Oral Daily   Continuous Infusions: . sodium chloride 10 mL/hr at 08/01/13 0700    Active Problems:   ETOH abuse   Hepatitis C   Cirrhosis   Seizure disorder   Hypothyroidism   Diabetes mellitus type 2, uncontrolled, with complications   Ascites   Chronic  back pain   Fracture, intertrochanteric, left femur   Respiratory failure   Hypotension   Encephalopathy acute    Time spent: >30 minutes    [facility] Pager [phone].   If 7PM-7AM, please contact night-coverage at [email or web address], password [facility]  08/03/2013, 2:44 PM  LOS: 11 days

## 2013-08-03 NOTE — Progress Notes (Signed)
Physical Therapy Treatment Patient Details Name: Tony Boyd MRN: [id number] DOB: 09-25-1955 Today's Date: 08/03/2013    History of Present Illness 58 yo former smoker with history of ETOH / Hepatitis liver disease fell at home resulting in L intertrochanteric fx and pulmonary contusion.  Had Nailing of hip on 5/29 associated with significant blood loss, and remained on vent post-op.    PT Comments    Good participation in PT, with noted small improvements in bed mobility and transfers; Difficulty with taking steps today due to L hip pain making pt quite hesitant and unable to accept weight into L stance, and therefore unable to advance RLE  Follow Up Recommendations  SNF;Supervision/Assistance - 24 hour     Equipment Recommendations  None recommended by PT    Recommendations for Other Services       Precautions / Restrictions Precautions Precautions: Fall Precaution Comments: pt edematous everywhere, bilat UEs/LEs and abdomen; as of 6/8, still edematous, but improved Restrictions LLE Weight Bearing: Weight bearing as tolerated    Mobility  Bed Mobility Overal bed mobility: Needs Assistance;+2 for physical assistance Bed Mobility: Supine to Sit     Supine to sit: Mod assist;+2 for physical assistance     General bed mobility comments: Verbal and tactile cues for technique.  Assist to bring LLE off of bed and to raise trunk to sitting.  Patient required increased time to perform tasks.  Transfers Overall transfer level: Needs assistance Equipment used: Rolling walker (2 wheeled) Transfers: Sit to/from Stand Sit to Stand: Mod assist;+2 physical assistance         General transfer comment: Verbal cues for hand placement.  Mod assist of 2 to rise to standing and for balance.  Instructed patient on transfer technique.  Difficulty with taking steps today; did not accept weight onto painful LLE resulting in decr ability to step/advance R foot.  Assist of 2 to control descent  into recliner. Ended up needing to move bed out of the way and move recliner to pt for sitting;  Lift pad in place under patient for return to bed.  Ambulation/Gait                 Stairs            Wheelchair Mobility    Modified Rankin (Stroke Patients Only)       Balance           Standing balance support: Bilateral upper extremity supported Standing balance-Leahy Scale: Poor                      Cognition Arousal/Alertness: Awake/alert (Once woken up) Behavior During Therapy: WFL for tasks assessed/performed;Impulsive Overall Cognitive Status: Impaired/Different from baseline Area of Impairment: Orientation;Memory;Safety/judgement;Awareness;Problem solving Orientation Level: Disoriented to;Place;Time;Situation Current Attention Level: Sustained Memory: Decreased short-term memory Following Commands: Follows one step commands with increased time Safety/Judgement: Decreased awareness of safety;Decreased awareness of deficits   Problem Solving: Slow processing;Difficulty sequencing;Requires verbal cues General Comments: Converation not in sync with situation; Continues to refer to getting on a bus and going to [employer] Exercises - Lower Extremity Ankle Circles/Pumps: AROM;Both;10 reps;Supine    General Comments        Pertinent Vitals/Pain Significant pain in L stance; Pt unable to rate pain patient repositioned for comfort     Home Living                      Prior  Function            PT Goals (current goals can now be found in the care plan section) Acute Rehab PT Goals PT Goal Formulation: With patient Time For Goal Achievement: 08/02/13 Potential to Achieve Goals: Good Progress towards PT goals: Progressing toward goals    Frequency  Min 4X/week    PT Plan Current plan remains appropriate    Co-evaluation             End of Session Equipment Utilized During Treatment: Gait  belt Activity Tolerance: Patient tolerated treatment well;Patient limited by pain Patient left: in chair;with call bell/phone within reach;with chair alarm set     Time: 8250-5397 PT Time Calculation (min): 23 min  Charges:  $Therapeutic Activity: 23-37 mins                    G Codes:      [facility] 08/03/2013, 11:14 AM  Roney Marion, [place] Pager [phone] Office [phone]

## 2013-08-03 NOTE — Clinical Social Work Note (Signed)
CSW informed by MD that he talked with patient twice today and he declined SNF both times. CSW talked with patient later today regarding discharge plans and patient's plan remains home. CSW also talked with patient regarding unsuccessful attempts to reach his daughters. Patient was not clear as to whether he has talked with his daughters by phone or if they have visited him.  Aadya Kindler Givens, MSW, LCSW [phone]

## 2013-08-04 LAB — GLUCOSE, CAPILLARY
GLUCOSE-CAPILLARY: 137 mg/dL — AB (ref 70–99)
GLUCOSE-CAPILLARY: 206 mg/dL — AB (ref 70–99)
Glucose-Capillary: 149 mg/dL — ABNORMAL HIGH (ref 70–99)
Glucose-Capillary: 197 mg/dL — ABNORMAL HIGH (ref 70–99)

## 2013-08-04 NOTE — Progress Notes (Addendum)
[facility] PROGRESS NOTE  Tony Boyd UKG:[id number] DOB: 1955-07-28 DOA: 07/23/2013  PCP: Hollace Kinnier, DO  Brief History: Tony Boyd is a 58 y.o. male with a past medical history of cirrhosis, anasarca, hypothyroidism, portal hypertension gastropathy. He presented to the hospital after mechanical fall involving his walker. He fell on his left side and subsequently developed left lower extremity discomfort. In the ED had x-ray which showed left intratrochanteric femur fracture. Also had chest x-ray reporting either left contusion versus pneumonia. Felt to be left pulmonary contusion given history of recent fall and as such antibiotics were not initiated. He received FFP's for INR of 2.2.He underwent surgery to fix the hip on 5/29. He had significant blood loss with surgery and was transferred to the ICU intubated. A central line was placed in Right IJ. He was transfused PRBC's. He required pressors for a brief period and required an arterial line as well. He remained confused. He pulled out his central line on 6/2 resulting in more bleeding. He also developed severe thrombocytopenia and had to be transfused platelets. He was seen by CIR but not felt a good candidate for inpatient rehab. A PICC line was placed to right basilic vein on 6/4 due to poor venous access. He was started on aggressive IV diuresis due to significant volume overload from multiple transfusions and underlying liver cirrhosis. Plan was for the patient to be discharged to SNF but he has been refusing. Due to his periods of confusion we obtained a psychiatric assessment during which he was found to lack capacity. Multiple attempts to reach his daughters were unsuccessful. Case was referred to the social worker who was considering referral to DSS.  Assessment/Plan: 1-left intertrochanteric fracture: s/p nailing by orthopedic surgery. -slowly progressing -will need SNF for rehabilitation but patient states he wants to go home.  Psyche has determined that he does not have capacity. CSW to pursue this further. Possible referral to DSS. -patient has not been accepted by CIR (lack of support at discharge)  2-ABLA: s/p transfusion (FFP and PRBC's) -Hgb continues to be stable -will follow trend and transfuse further as needed (if Hgb less than 7)  3-Cirrhosis/ESLD -with anasarca and fluid overload -He is diuresing well. Continue lasix IV at current dose. Can transition to oral Lasix at discharge. Continue spironolactone -follow VS and volume status -continue rifaximin and lactulose. Ammonia was normal.  4-ETOH abuse: -cessation counseling provided -continue CIWA, thiamine and folic acid  5-GERD:continue PPI  6-thrombocytopenia: due to cirrhosis  -will continue SCD's and avoid heparin products -Recd one unit of platelets on 6/4 -Counts stable.  7-DM type 2: -continue SSI -follow CBG's now that patient is eating better and adjust hypoglycemic regimen as needed  8-hypothyroidism: -continue synthroid  9-Hx of seizure: no seizure activity appreciated -continue keppra  10-depression/anxiety: continue risperdal and paxil -Psyche has determined that patient does not have capacity to make medical decisions. Have been attempting to reach family without any luck. CSW to assist with further management of this issue.  -On 6/7 night he was given Ambien and experienced side effects so it was stopped. Mental status appears back to baseline now.  11-resp failure and hypotension: -resolved now -continue flutter valve/IS and PRN oxygen supplementation.  10-chronic pain: will continue current PO analgesic regimen with oxycodone 20mg  Q6HPRN  DVT Prophylaxis: SCD's only, due to severe thrombocytopenia Code Status: Full Family Communication: no family at bedside Disposition Plan: SNF when medically stable but patient is refusing. But he lacks capacity per Psyche. CSW aware.  Consultants:  Orthopedic  service  PCCM  Procedures:  Left hip intertrochanteric nailing   PICC line  Antibiotics:  None   HPI/Subjective: Patient denies any complaints. Wants to go home.   Objective: Filed Vitals:   08/04/13 0944  BP: 104/65  Pulse: 87  Temp: 98.7 F (37.1 C)  Resp: 19    Intake/Output Summary (Last 24 hours) at 08/04/13 1234 Last data filed at 08/04/13 0500  Gross per 24 hour  Intake     90 ml  Output   2950 ml  Net  -2860 ml   Filed Weights   07/31/13 2100 08/01/13 2126 08/02/13 2226  Weight: 126.962 kg (279 lb 14.4 oz) 123.333 kg (271 lb 14.4 oz) 124.785 kg (275 lb 1.6 oz)    Exam:   General:  No distress. distracted at times  Cardiovascular: S1 and S2, no rubs or gallops; 2+ edema bilaterally  Respiratory: improved air movement  Abdomen: distended, non tender; positive BS  Musculoskeletal: no cyanosis; pain with movement on left lower extremity   Data Reviewed: Basic Metabolic Panel:  Recent Labs Lab 07/29/13 0655 07/30/13 0515 08/01/13 0500 08/02/13 0410 08/03/13 0450  NA 142 140 138 139 138  K 3.6* 3.6* 3.6* 3.8 3.7  CL 104 102 96 97 97  CO2 31 31 36* 33* 35*  GLUCOSE 148* 154* 121* 149* 113*  BUN 12 8 8 9 8   CREATININE 0.50 0.47* 0.59 0.63 0.55  CALCIUM 8.5 8.4 8.0* 7.9* 7.9*  MG 1.5  --   --   --   --   PHOS 1.7*  --   --   --   --    Liver Function Tests:  Recent Labs Lab 08/01/13 1125 08/02/13 0410  AST 30 35  ALT 13 12  ALKPHOS 126* 133*  BILITOT 7.5* 7.1*  PROT 5.5* 5.6*  ALBUMIN 2.4* 2.4*   CBC:  Recent Labs Lab 07/30/13 0515 07/31/13 0445 08/01/13 0500 08/02/13 0410 08/03/13 0450  WBC 2.3* 3.0* 4.2 4.1 5.0  HGB 7.4* 7.8* 7.8* 7.9* 8.1*  HCT 22.6* 24.4* 24.6* 24.8* 25.1*  MCV 104.1* 108.0* 107.4* 106.9* 106.8*  PLT 8* 49* 49* 49* 49*   BNP (last 3 results)  Recent Labs  05/31/13 2330 06/22/13 1738 06/30/13 2225  PROBNP 304.0* 296.3* 669.2*   CBG:  Recent Labs Lab 08/03/13 1226 08/03/13 1746  08/03/13 2158 08/04/13 0736 08/04/13 1126  GLUCAP 202* 176* 146* 137* 149*    Recent Results (from the past 240 hour(s))  CLOSTRIDIUM DIFFICILE BY PCR     Status: None   Collection Time    07/30/13  9:29 AM      Result Value Ref Range Status   C difficile by pcr NEGATIVE  NEGATIVE Final     Studies: No results found.  Scheduled Meds: . feeding supplement (ENSURE COMPLETE)  237 mL Oral BID BM  . folic acid  1 mg Oral Daily  . furosemide  40 mg Intravenous Q12H  . insulin aspart  0-15 Units Subcutaneous TID WC  . iron polysaccharides  150 mg Oral BID  . lactulose  40 g Oral BID  . levETIRAcetam  1,000 mg Oral BID  . levothyroxine  75 mcg Oral QAC breakfast  . multivitamin with minerals  1 tablet Oral q morning - 10a  . pantoprazole  40 mg Oral Daily  . PARoxetine  20 mg Oral Daily  . potassium chloride  40 mEq Oral Daily  . rifaximin  550 mg Oral  BID  . sodium chloride  10-40 mL Intracatheter Q12H  . spironolactone  25 mg Oral BID  . thiamine  100 mg Oral Daily   Continuous Infusions: . sodium chloride 10 mL/hr at 08/01/13 0700    Active Problems:   ETOH abuse   Hepatitis C   Cirrhosis   Seizure disorder   Hypothyroidism   Diabetes mellitus type 2, uncontrolled, with complications   Ascites   Chronic back pain   Fracture, intertrochanteric, left femur   Respiratory failure   Hypotension   Encephalopathy acute    [facility] Pager [phone].   If 7PM-7AM, please contact night-coverage at [email or web address], password [facility]  08/04/2013, 12:34 PM  LOS: 12 days

## 2013-08-04 NOTE — Progress Notes (Signed)
NUTRITION FOLLOW-UP  DOCUMENTATION CODES Per approved criteria  -Morbid Obesity   INTERVENTION: Continue Ensure Complete po BID, each supplement provides 350 kcal and 13 grams of protein. RD to continue to follow nutrition care plan.  NUTRITION DIAGNOSIS: Increased nutrient needs related to cirrhosis and fracture as evidenced by estimated needs; ongoing.   Goal: Pt to meet >/= 90% of their estimated nutrition needs, progressing.    Monitor:  PO intake, weight trend, labs, supplement tolerance  ASSESSMENT: Pt admitted with left hip fracture s/p mechanical fall. Per MD note pt is alcoholic with ESLD/cirrhosis.   Pt underwent nailing of hip fracture by ortho on 5/29.  Evaluated by CIR but was not accepted, as he has lack of support at discharge. Pt seen by psych on 6/6 and was determined to not meet criteria for capacity to make his own medial decisions. Team continues to work on [employer].  Continues on Carbohydrate Modified Medium diet. Eating 50-100% of meals. RN confirms that pt is drinking Ensure Complete as scheduled.  Continues to have significant edema to lower extremities.  CBG's: 137 - 149 Most recent phosphorus is low at 1.7  Height: Ht Readings from Last 1 Encounters:  08/02/13 5\' 9"  (1.753 m)    Weight: Wt Readings from Last 1 Encounters:  08/02/13 275 lb 1.6 oz (124.785 kg)  Admission weight: 269 lb (122.2 kg) 5/28  BMI:  Body mass index is 40.61 kg/(m^2). Obese Class III  Estimated Nutritional Needs: Kcal: 2200-2400  Protein: 115-130 grams  Fluid: >/= 1.5 L daily  Skin:  Jaundiced Generalized edema Significant (+3) edema to lower extremities R hip incision  Diet Order: Carb Control    Intake/Output Summary (Last 24 hours) at 08/04/13 1647 Last data filed at 08/04/13 1600  Gross per 24 hour  Intake    450 ml  Output   2002 ml  Net  -1552 ml    Last BM: 6/9 - diarrhea  Labs:   Recent Labs Lab 07/29/13 0655  08/01/13 0500  08/02/13 0410 08/03/13 0450  NA 142  < > 138 139 138  K 3.6*  < > 3.6* 3.8 3.7  CL 104  < > 96 97 97  CO2 31  < > 36* 33* 35*  BUN 12  < > 8 9 8   CREATININE 0.50  < > 0.59 0.63 0.55  CALCIUM 8.5  < > 8.0* 7.9* 7.9*  MG 1.5  --   --   --   --   PHOS 1.7*  --   --   --   --   GLUCOSE 148*  < > 121* 149* 113*  < > = values in this interval not displayed.  CBG (last 3)   Recent Labs  08/03/13 2158 08/04/13 0736 08/04/13 1126  GLUCAP 146* 137* 149*    Scheduled Meds: . feeding supplement (ENSURE COMPLETE)  237 mL Oral BID BM  . folic acid  1 mg Oral Daily  . furosemide  40 mg Intravenous Q12H  . insulin aspart  0-15 Units Subcutaneous TID WC  . iron polysaccharides  150 mg Oral BID  . lactulose  40 g Oral BID  . levETIRAcetam  1,000 mg Oral BID  . levothyroxine  75 mcg Oral QAC breakfast  . multivitamin with minerals  1 tablet Oral q morning - 10a  . pantoprazole  40 mg Oral Daily  . PARoxetine  20 mg Oral Daily  . potassium chloride  40 mEq Oral Daily  . rifaximin  550 mg Oral BID  . sodium chloride  10-40 mL Intracatheter Q12H  . spironolactone  25 mg Oral BID  . thiamine  100 mg Oral Daily    Continuous Infusions: . sodium chloride 10 mL/hr at 08/01/13 0700    Inda Coke MS, RD, LDN Inpatient Registered Dietitian Pager: [phone] After-hours pager: [phone]

## 2013-08-04 NOTE — Progress Notes (Signed)
Physical Therapy Treatment Patient Details Name: Tony Boyd MRN: [id number] DOB: 28-Apr-1955 Today's Date: 08/04/2013    History of Present Illness 58 yo former smoker with history of ETOH / Hepatitis liver disease fell at home resulting in L intertrochanteric fx and pulmonary contusion.  Had Nailing of hip on 5/29 associated with significant blood loss, and remained on vent post-op.    PT Comments    Pt continues to be limited to pivotal steps only due to pain and difficulty weightbearing through [place]. Discussed D/C disposition with pt and continue to recommend SNF; pt continues to believe he can D/C home at this level of mobility. Will cont to follow per POC. Pt is a high fall risk due to decreased cognition.   Follow Up Recommendations  SNF;Supervision/Assistance - 24 hour     Equipment Recommendations  None recommended by PT    Recommendations for Other Services       Precautions / Restrictions Precautions Precautions: Fall Precaution Comments: pt edematous everywhere, bilat UEs/LEs and abdomen; as of 6/8, still edematous, but improved Restrictions Weight Bearing Restrictions: Yes LLE Weight Bearing: Weight bearing as tolerated    Mobility  Bed Mobility Overal bed mobility: Needs Assistance Bed Mobility: Supine to Sit     Supine to sit: Min assist;+2 for physical assistance;HOB elevated     General bed mobility comments: (A) to advance Lt LE and bring trunk to upright position; incr time due to pain   Transfers Overall transfer level: Needs assistance Equipment used: Rolling walker (2 wheeled) Transfers: Sit to/from [facility] Sit to Stand: +2 physical assistance;Min assist Stand pivot transfers: +2 physical assistance;Min assist       General transfer comment: pivotal steps to chair with difficulty WB'ing through Lt LE; cues for upright posture and sequencing   Ambulation/Gait             General Gait Details: pivotal steps only to  chair    Stairs            Wheelchair Mobility    Modified Rankin (Stroke Patients Only)       Balance Overall balance assessment: Needs assistance Sitting-balance support: Feet supported;Single extremity supported Sitting balance-Leahy Scale: Fair     Standing balance support: During functional activity;Bilateral upper extremity supported Standing balance-Leahy Scale: Poor Standing balance comment: requires use of RW                    Cognition Arousal/Alertness: Awake/alert Behavior During Therapy: WFL for tasks assessed/performed Overall Cognitive Status: No family/caregiver present to determine baseline cognitive functioning Area of Impairment: Orientation;Memory;Safety/judgement;Awareness;Problem solving Orientation Level: Disoriented to;Time;Situation;Place Current Attention Level: Sustained Memory: Decreased short-term memory Following Commands: Follows one step commands with increased time Safety/Judgement: Decreased awareness of safety;Decreased awareness of deficits Awareness: Intellectual Problem Solving: Slow processing;Difficulty sequencing;Requires verbal cues General Comments: conversation continues to be out of sync and pt continues to state he is independent     Exercises General Exercises - Lower Extremity Ankle Circles/Pumps: AROM;Both;10 reps;Supine    General Comments General comments (skin integrity, edema, etc.): pt with difficulty following commands for exercises today       Pertinent Vitals/Pain "10/10. RN aware and pt requesting pain medication     Home Living                      Prior Function            PT Goals (current goals can now be found  in the care plan section) Acute Rehab PT Goals Patient Stated Goal: to go home PT Goal Formulation: With patient Time For Goal Achievement: 08/07/13 Potential to Achieve Goals: Fair Progress towards PT goals: Progressing toward goals    Frequency  Min 4X/week     PT Plan Current plan remains appropriate    Co-evaluation             End of Session Equipment Utilized During Treatment: Gait belt Activity Tolerance: Patient tolerated treatment well;Patient limited by pain Patient left: in chair;with call bell/phone within reach;with chair alarm set;with nursing/sitter in room     Time: 1205-1219 PT Time Calculation (min): 14 min  Charges:  $Therapeutic Activity: 8-22 mins                    G CodesKennis Carina [place], [place]  [phone] 08/04/2013, 5:18 PM

## 2013-08-04 NOTE — Progress Notes (Signed)
OT Cancellation Note  Patient Details Name: Tony Boyd MRN: [id number] DOB: [birth year]   Cancelled Treatment:    Reason Eval/Treat Not Completed: Patient declined, no reason specified. Pr requested OT return later. OT will will re attempt later today as time allows  [name] 08/04/2013, 2:08 PM

## 2013-08-05 DIAGNOSIS — R5383 Other fatigue: Secondary | ICD-10-CM

## 2013-08-05 DIAGNOSIS — R5381 Other malaise: Secondary | ICD-10-CM

## 2013-08-05 LAB — GLUCOSE, CAPILLARY
Glucose-Capillary: 129 mg/dL — ABNORMAL HIGH (ref 70–99)
Glucose-Capillary: 141 mg/dL — ABNORMAL HIGH (ref 70–99)
Glucose-Capillary: 175 mg/dL — ABNORMAL HIGH (ref 70–99)
Glucose-Capillary: 195 mg/dL — ABNORMAL HIGH (ref 70–99)

## 2013-08-05 LAB — COMPREHENSIVE METABOLIC PANEL
ALBUMIN: 2.4 g/dL — AB (ref 3.5–5.2)
ALT: 14 U/L (ref 0–53)
AST: 38 U/L — AB (ref 0–37)
Alkaline Phosphatase: 171 U/L — ABNORMAL HIGH (ref 39–117)
BUN: 7 mg/dL (ref 6–23)
CO2: 34 mEq/L — ABNORMAL HIGH (ref 19–32)
CREATININE: 0.53 mg/dL (ref 0.50–1.35)
Calcium: 8.5 mg/dL (ref 8.4–10.5)
Chloride: 99 mEq/L (ref 96–112)
GFR calc Af Amer: >90 mL/min (ref >90)
Glucose, Bld: 138 mg/dL — ABNORMAL HIGH (ref 70–99)
Potassium: 3.8 mEq/L (ref 3.7–5.3)
Sodium: 139 mEq/L (ref 137–147)
TOTAL PROTEIN: 6 g/dL (ref 6.0–8.3)
Total Bilirubin: 5.9 mg/dL — ABNORMAL HIGH (ref 0.3–1.2)

## 2013-08-05 LAB — CBC
HCT: 27 % — ABNORMAL LOW (ref 39.0–52.0)
Hemoglobin: 8.8 g/dL — ABNORMAL LOW (ref 13.0–17.0)
MCH: 35.6 pg — AB (ref 26.0–34.0)
MCHC: 32.6 g/dL (ref 30.0–36.0)
MCV: 109.3 fL — AB (ref 78.0–100.0)
Platelets: 48 10*3/uL — ABNORMAL LOW (ref 150–400)
RBC: 2.47 MIL/uL — AB (ref 4.22–5.81)
RDW: 22.8 % — AB (ref 11.5–15.5)
WBC: 3.9 10*3/uL — ABNORMAL LOW (ref 4.0–10.5)

## 2013-08-05 LAB — AMMONIA: Ammonia: 40 umol/L (ref 11–60)

## 2013-08-05 MED ORDER — INSULIN ASPART 100 UNIT/ML ~~LOC~~ SOLN: 0.0000 [IU] | Freq: Every day | SUBCUTANEOUS | Status: DC

## 2013-08-05 MED ORDER — INSULIN ASPART 100 UNIT/ML ~~LOC~~ SOLN
0.0000 [IU] | Freq: Three times a day (TID) | SUBCUTANEOUS | Status: DC
  Administered 2013-08-05: 3 [IU] via SUBCUTANEOUS
  Administered 2013-08-05: 4 [IU] via SUBCUTANEOUS
  Administered 2013-08-06: 3 [IU] via SUBCUTANEOUS
  Administered 2013-08-06: 4 [IU] via SUBCUTANEOUS
  Administered 2013-08-07 (×2): 3 [IU] via SUBCUTANEOUS

## 2013-08-05 NOTE — Progress Notes (Signed)
[facility] PROGRESS NOTE  Tony Boyd LKG:[id number] DOB: 30-Jul-1955 DOA: 07/23/2013  PCP: Hollace Kinnier, DO  Brief History: Tony Boyd is a 58 y.o. male with a past medical history of cirrhosis, anasarca, hypothyroidism, portal hypertension gastropathy. He presented to the hospital after mechanical fall involving his walker. He fell on his left side and subsequently developed left lower extremity discomfort. In the ED had x-ray which showed left intratrochanteric femur fracture. Also had chest x-ray reporting either left contusion versus pneumonia. Felt to be left pulmonary contusion given history of recent fall and as such antibiotics were not initiated. He received FFP's for INR of 2.2.He underwent surgery to fix the hip on 5/29. He had significant blood loss with surgery and was transferred to the ICU intubated. A central line was placed in Right IJ. He was transfused PRBC's. He required pressors for a brief period and required an arterial line as well. He remained confused. He pulled out his central line on 6/2 resulting in more bleeding. He also developed severe thrombocytopenia and had to be transfused platelets. He was seen by CIR but not felt a good candidate for inpatient rehab. A PICC line was placed to right basilic vein on 6/4 due to poor venous access. He was started on aggressive IV diuresis due to significant volume overload from multiple transfusions and underlying liver cirrhosis. Plan was for the patient to be discharged to SNF but he has been refusing. Due to his periods of confusion we obtained a psychiatric assessment during which he was found to lack capacity. Multiple attempts to reach his daughters were unsuccessful. Case was referred to the social worker who was considering referral to DSS.  Assessment/Plan: 1-left intertrochanteric fracture: s/p nailing by orthopedic surgery. -slowly progressing -will need SNF for rehabilitation but patient states he wants to go home.  Psyche has determined that he does not have capacity. Discussed with social work. Plan will be to have Police Department tried to track down daughters. If this does not work, we have an address and I will personally leave a letter at the door urging him to contact us. In the end, if we're unsuccessful in talking to the family, will need to get guardianship -patient has not been accepted by CIR (lack of support at discharge)  2-ABLA: s/p transfusion (FFP and PRBC's) -Hgb continues to be stable -will follow trend and transfuse further as needed (if Hgb less than 7)  3-Cirrhosis/ESLD -with anasarca and fluid overload -He is diuresing well. Continue lasix IV at current dose. Can transition to oral Lasix at discharge. Continue spironolactone -follow VS and volume status He is down 22 L -continue rifaximin and lactulose. Ammonia was normal.  4-ETOH abuse: -cessation counseling provided -continue CIWA, thiamine and folic acid  5-GERD:continue PPI  6-thrombocytopenia: due to cirrhosis  -will continue SCD's and avoid heparin products -Recd one unit of platelets on 6/4 -Counts stable.  7-DM type 2: -continue SSI With patient eating better, CBG is trending up. Have increased sliding scale to resistant  8-hypothyroidism: -continue synthroid  9-Hx of seizure: no seizure activity appreciated -continue keppra  10-depression/anxiety: continue risperdal and paxil -Psyche has determined that patient does not have capacity to make medical decisions. Have been attempting to reach family without any luck. CSW to assist with further management of this issue.  -On 6/7 night he was given Ambien and experienced side effects so it was stopped. Mental status appears back to baseline now.  11-resp failure and hypotension: -resolved now -continue flutter valve/IS and  PRN oxygen supplementation.  10-chronic pain: will continue current PO analgesic regimen with oxycodone 20mg  Q6HPRN  DVT Prophylaxis:  SCD's only, due to severe thrombocytopenia Code Status: Full Family Communication: no family at bedside, attempted to call daughter Disposition Plan: SNF when medically stable but patient is refusing. But he lacks capacity per Psyche. CSW aware.    Consultants:  Orthopedic service  PCCM  Procedures:  Left hip intertrochanteric nailing   PICC line  Antibiotics:  None   HPI/Subjective: Patient denies any complaints other than hip soreness.   Objective: Filed Vitals:   08/05/13 0924  BP: 116/56  Pulse: 83  Temp: 98.1 F (36.7 C)  Resp: 20    Intake/Output Summary (Last 24 hours) at 08/05/13 1150 Last data filed at 08/05/13 0900  Gross per 24 hour  Intake    600 ml  Output   1102 ml  Net   -502 ml   Filed Weights   08/01/13 2126 08/02/13 2226 08/04/13 2100  Weight: 123.333 kg (271 lb 14.4 oz) 124.785 kg (275 lb 1.6 oz) 120.294 kg (265 lb 3.2 oz)    Exam:   General:  Alert and oriented x2, no acute distress  Cardiovascular: Regular rate and rhythm, S1-S2  Respiratory: No clubbing or cyanosis or edema  Abdomen: Soft, nontender, nondistended, positive bowel sounds  Musculoskeletal: 2+ pitting edema bilaterally   Data Reviewed: Basic Metabolic Panel:  Recent Labs Lab 07/30/13 0515 08/01/13 0500 08/02/13 0410 08/03/13 0450 08/05/13 0425  NA 140 138 139 138 139  K 3.6* 3.6* 3.8 3.7 3.8  CL 102 96 97 97 99  CO2 31 36* 33* 35* 34*  GLUCOSE 154* 121* 149* 113* 138*  BUN 8 8 9 8 7   CREATININE 0.47* 0.59 0.63 0.55 0.53  CALCIUM 8.4 8.0* 7.9* 7.9* 8.5   Liver Function Tests:  Recent Labs Lab 08/01/13 1125 08/02/13 0410 08/05/13 0425  AST 30 35 38*  ALT 13 12 14   ALKPHOS 126* 133* 171*  BILITOT 7.5* 7.1* 5.9*  PROT 5.5* 5.6* 6.0  ALBUMIN 2.4* 2.4* 2.4*   CBC:  Recent Labs Lab 07/31/13 0445 08/01/13 0500 08/02/13 0410 08/03/13 0450 08/05/13 0425  WBC 3.0* 4.2 4.1 5.0 3.9*  HGB 7.8* 7.8* 7.9* 8.1* 8.8*  HCT 24.4* 24.6* 24.8* 25.1*  27.0*  MCV 108.0* 107.4* 106.9* 106.8* 109.3*  PLT 49* 49* 49* 49* 48*   BNP (last 3 results)  Recent Labs  05/31/13 2330 06/22/13 1738 06/30/13 2225  PROBNP 304.0* 296.3* 669.2*   CBG:  Recent Labs Lab 08/04/13 0736 08/04/13 1126 08/04/13 1728 08/04/13 2200 08/05/13 0744  GLUCAP 137* 149* 197* 206* 129*    Recent Results (from the past 240 hour(s))  CLOSTRIDIUM DIFFICILE BY PCR     Status: None   Collection Time    07/30/13  9:29 AM      Result Value Ref Range Status   C difficile by pcr NEGATIVE  NEGATIVE Final     Studies: No results found.  Scheduled Meds: . feeding supplement (ENSURE COMPLETE)  237 mL Oral BID BM  . folic acid  1 mg Oral Daily  . furosemide  40 mg Intravenous Q12H  . insulin aspart  0-15 Units Subcutaneous TID WC  . iron polysaccharides  150 mg Oral BID  . lactulose  40 g Oral BID  . levETIRAcetam  1,000 mg Oral BID  . levothyroxine  75 mcg Oral QAC breakfast  . multivitamin with minerals  1 tablet Oral q morning -  10a  . pantoprazole  40 mg Oral Daily  . PARoxetine  20 mg Oral Daily  . potassium chloride  40 mEq Oral Daily  . rifaximin  550 mg Oral BID  . sodium chloride  10-40 mL Intracatheter Q12H  . spironolactone  25 mg Oral BID  . thiamine  100 mg Oral Daily   Continuous Infusions: . sodium chloride 10 mL/hr at 08/01/13 0700    Active Problems:   ETOH abuse   Hepatitis C   Cirrhosis   Seizure disorder   Hypothyroidism   Diabetes mellitus type 2, uncontrolled, with complications   Ascites   Chronic back pain   Fracture, intertrochanteric, left femur   Respiratory failure   Hypotension   Encephalopathy acute    Annita Brod  [facility] Pager [phone].   If 7PM-7AM, please contact night-coverage at [email or web address], password [facility]  08/05/2013, 11:50 AM  LOS: 13 days

## 2013-08-05 NOTE — Clinical Social Work Note (Signed)
On Tuesday, 6/9 CSW contacted [facility] [employer] and made a report requesting DSS pursue guardianship of patient. CSW also completed a computer search for daughter Tony Boyd and found an address [address] [place], phone [phone]. Number called and message left to call CSW regarding a family member in the hospital.  On morning of 6/10 CSW and clinical social work Surveyor, quantity, Tony Boyd talked with patient regarding SNF placement for short-term rehab and he continues to refuse.  When asked, patient stated that his daughter 15 middle name is Tony Boyd (or Tony Boyd) and she has house that she and his daughter Tony Boyd share in the [facility] area.  Dr. [place] Pink has also spoken with patient this morning regarding discharge plans and he is refusing a skilled nursing facility. CSW has attempted for several days to reach both daughters with numbers on file without success: Tony Boyd - [phone] and Tony Boyd - [phone].  CSW also called phone number associated with [place] address this morning and message left. [place] non-emergency number called and CSW requested that police attempt to locate Tony Boyd address given) and have daughter contact this CSW.   Tony Boyd, MSW, LCSW [phone]

## 2013-08-05 NOTE — Progress Notes (Signed)
OT Cancellation Note  Patient Details Name: Miloh Alcocer MRN: [id number] DOB: 09/28/1955   Cancelled Treatment:    Reason Eval/Treat Not Completed: Pain limiting ability to participate. Pt refusing due to pain.  Benito Mccreedy OTR/L [phone] 08/05/2013, 9:07 AM

## 2013-08-05 NOTE — Progress Notes (Signed)
Physical Therapy Treatment Patient Details Name: Tony Boyd MRN: [id number] DOB: 1955/11/16 Today's Date: 08/05/2013    History of Present Illness 58 yo former smoker with history of ETOH / Hepatitis liver disease fell at home resulting in L intertrochanteric fx and pulmonary contusion.  Had Nailing of hip on 5/29 associated with significant blood loss, and remained on vent post-op.    PT Comments    Slow progress with mobility, and unquestionably needs more postacute rehab at SNF to maximize independence and safety with mobility.   Follow Up Recommendations  SNF;Supervision/Assistance - 24 hour     Equipment Recommendations  None recommended by PT    Recommendations for Other Services       Precautions / Restrictions Precautions Precautions: Fall Precaution Comments: pt edematous everywhere, bilat UEs/LEs and abdomen; as of 6/10, still edematous, but improved Restrictions Weight Bearing Restrictions: Yes LLE Weight Bearing: Weight bearing as tolerated    Mobility  Bed Mobility Overal bed mobility: Needs Assistance Bed Mobility: Supine to Sit     Supine to sit: Min assist;+2 for physical assistance;HOB elevated     General bed mobility comments: (A) to advance Lt LE and bring trunk to upright position; incr time due to pain   Transfers Overall transfer level: Needs assistance Equipment used: Rolling walker (2 wheeled)   Sit to Stand: +2 physical assistance;Min assist Stand pivot transfers: +2 physical assistance;Min assist       General transfer comment: pivotal steps to chair with difficulty WB'ing through Lt LE; cues for upright posture and sequencing   Ambulation/Gait Ambulation/Gait assistance: +2 physical assistance Ambulation Distance (Feet): 3 Feet (pivot steps bed to chair) Assistive device: Rolling walker (2 wheeled) Gait Pattern/deviations: Shuffle     General Gait Details: pivotal steps only to chair; Showed more weight acceptance LLE as he waas  able to take a few steps with his RLE   Science writer    Modified Rankin (Stroke Patients Only)       Balance Overall balance assessment: Needs assistance Sitting-balance support: Feet supported;Bilateral upper extremity supported Sitting balance-Leahy Scale: Fair Sitting balance - Comments: Tolerated sitting EOB with Bed elevated; Sat for uite a while including making weight shifts to R while dressing was changed; phayical assist and support given to LLE with R leans   Standing balance support: Bilateral upper extremity supported;During functional activity Standing balance-Leahy Scale: Poor Standing balance comment: Continues to require bilateral UE support                    Cognition Arousal/Alertness: Awake/alert Behavior During Therapy: WFL for tasks assessed/performed Overall Cognitive Status: No family/caregiver present to determine baseline cognitive functioning Area of Impairment: Orientation;Memory;Safety/judgement;Awareness;Problem solving Orientation Level: Disoriented to;Time;Situation;Place Current Attention Level: Sustained Memory: Decreased short-term memory Following Commands: Follows one step commands with increased time;Follows one step commands consistently Safety/Judgement: Decreased awareness of safety;Decreased awareness of deficits   Problem Solving: Slow processing;Difficulty sequencing;Requires verbal cues General Comments: conversation continues to be out of sync and pt continues to state he is independent     Exercises      General Comments        Pertinent Vitals/Pain 7/10 L hip pain; Was premedicated for pain patient repositioned for comfort     Home Living                      Prior Function  PT Goals (current goals can now be found in the care plan section) Acute Rehab PT Goals Patient Stated Goal: to go home PT Goal Formulation: With patient Time For Goal Achievement:  08/07/13 Potential to Achieve Goals: Fair Progress towards PT goals: Progressing toward goals (slowly)    Frequency  Min 4X/week    PT Plan Current plan remains appropriate    Co-evaluation             End of Session Equipment Utilized During Treatment: Gait belt Activity Tolerance: Patient tolerated treatment well;Patient limited by pain Patient left: in chair;with call bell/phone within reach;with chair alarm set     Time: 4888-9169 PT Time Calculation (min): 25 min  Charges:  $Gait Training: 8-22 mins $Therapeutic Activity: 8-22 mins                    G Codes:      Quin Hoop 08/05/2013, 1:30 PM  Roney Marion, [place] Pager [phone] Office [phone]

## 2013-08-05 NOTE — Consult Note (Signed)
[facility] Consult   Reason for Consult:  Capacity evaluation Referring Physician:  Dr. Jackqulyn Livings Tony Boyd is an 58 y.o. male. Total Time spent with patient: 45 minutes  Assessment: AXIS I:  Post Traumatic Stress Disorder, Substance Abuse and Substance Induced Mood Disorder AXIS II:  Deferred AXIS III:   Past Medical History  Diagnosis Date  . Cirrhosis of liver   . Hepatitis C   . ETOH abuse   . Hypothyroid   . Psychosis   . Bipolar disorder   . Seizures   . Arthritis     chronic back pain  . Coagulopathy onset prior to 2009  . Thrombocytopenia onsetprior to 2009  . Pancytopenia onset prior to 2009    with anemia, low platelets  . Hyponatremia onset prior to 2009  . Korsakoff psychosis   . H/O abdominal abscess 2012    abcess involving ventral hernia. drained 06/2010 by IR  . 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 Hepatic encephalopathy prior to 2009  . H/O renal failure   . Ascites 01/2013    too minor to tap on ultrasound  . Peripheral vascular disease   . GERD (gastroesophageal reflux disease)   . Hypothyroidism 06/02/2012  . Obesity   . Cellulitis and abscess of upper arm and forearm   . Chronic kidney disease, unspecified   . Type 2 diabetes mellitus with diabetic neuropathy   . Compression fracture of L3 lumbar vertebra 01/2013  . Hernia of abdominal wall 2012    multiple ventral hernias  . Anxiety   . Depression   . Fibromyalgia    AXIS IV:  other psychosocial or environmental problems, problems related to social environment and problems with primary support group AXIS V:  51-60 moderate symptoms  Plan:  Patient does  meet criteria for capacity to make his own medical decisions The patient can be discharged home when medically stable and followup of patient psychiatric services at [facility] for posttraumatic stress disorder No evidence of imminent risk to self or others at present.   Patient  does not meet criteria for psychiatric inpatient admission. Appreciate psychiatric consultation Please contact [phone] if needs further assistance  Subjective:   Tony Boyd is a 58 y.o. male patient admitted with depression and fall.  HPI: Patient was seen, chart reviewed and case discussed with Dr. Maryland Pink. 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 Patient has been receiving medication management from [facility] in [place]. Patient has no previous acute psychiatric hospitalization. Patient has limited cognition and has poor orientation, okay we'll concentration and language. Patient does not understand his current medical condition in full length and the necessary treatments recommendations. Patient has minimizing his capacity to be on his own and has a limited family support. Based on my evaluation today patient does not meet criteria for capacity to make his own medical decisions and required medical care power of attorney which can be assisted by psychiatric social service with the help of family members if needed. I have tried to reach family members especially his doctors without success. Social service also trying to reach without success and we will keep trying to reach the family for appropriate disposition plans.  Internal history: Tony Boyd is seen today for reassessmenta as requested by LCSW and Dr. Maryland Pink. Patient has showed significant clinical improvement on  mental status improvement. Patient has intact cognition and has no safety concerns. Patient denies current symptoms of depression, anxiety, Manuela Schwartz onset ideation, intents or plans. Patient has no evidence of psychotic symptoms during this visit  Medical history: 58 y.o. male With Past medical history of cirrhosis, anasarca, hypothyroidism, portal hypertension gastropathy. Presented to  the hospital after mechanical fall involving his walker. He fell on his left side and subsequently developed left lower extremity discomfort to was found by his daughter after being on floor for a prolonged period of time. Had discomfort when trying to bear weight on his left lower extremity. In the ED had x-ray which showed left intratrochanteric femur fracture. Also had chest x-ray reporting either left contusion versus pneumonia. Felt to be left pulmonary contusion given history of recent fall as such antibiotics were not initiated.  Case was discussed with orthopedic surgeons recommended transitioning to [facility] for further evaluation and recommendations. We were consulted for further medical management   Review of Systems:  Constitutional:  No weight loss, night sweats, Fevers, chills, fatigue.  HEENT:  No headaches, Difficulty swallowing,Tooth/dental problems,Sore throat,  No sneezing, itching, ear ache, nasal congestion, post nasal drip,  Cardio-vascular:  No chest pain, Orthopnea, PND, swelling in lower extremities, anasarca, dizziness, palpitations  GI:  No heartburn, indigestion, abdominal pain, nausea, vomiting, + diarrhea, change in bowel habits, loss of appetite  Resp:  No shortness of breath with exertion or at rest. No excess mucus, no productive cough, No non-productive cough, No coughing up of blood.No change in color of mucus.No wheezing.No chest wall deformity  Skin:  no rash or lesions.  GU:  no dysuria, change in color of urine, no urgency or frequency. No flank pain.  Musculoskeletal:  No joint pain or swelling. + decreased range of motion. No back pain.  Psych:  No change in mood or affect. No depression or anxiety. No memory loss.   HPI Elements:   Location:  Depression, anxiety. Quality:  Poor. Severity:  Moderate. Timing:  No acute stressors except medical problems secondary to fall and being noncompliant with medications.  Past Psychiatric History: Past  Medical History  Diagnosis Date  . Cirrhosis of liver   . Hepatitis C   . ETOH abuse   . Hypothyroid   . Psychosis   . Bipolar disorder   . Seizures   . Arthritis     chronic back pain  . Coagulopathy onset prior to 2009  . Thrombocytopenia onsetprior to 2009  . Pancytopenia onset prior to 2009    with anemia, low platelets  . Hyponatremia onset prior to 2009  . Korsakoff psychosis   . H/O abdominal abscess 2012    abcess involving ventral hernia. drained 06/2010 by IR  . 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 Hepatic encephalopathy prior to 2009  . H/O renal failure   . Ascites 01/2013    too minor to tap on ultrasound  . Peripheral vascular disease   . GERD (gastroesophageal reflux disease)   . Hypothyroidism 06/02/2012  . Obesity   . Cellulitis and abscess of upper arm and forearm   . Chronic kidney disease, unspecified   . Type 2 diabetes mellitus with diabetic neuropathy   . Compression fracture of L3 lumbar vertebra 01/2013  . Hernia of abdominal wall 2012    multiple ventral hernias  . Anxiety   . Depression   . Fibromyalgia     reports that he  quit smoking about 5 years ago. His smoking use included Cigarettes. He smoked 0.00 packs per day. He has never used smokeless tobacco. He reports that he drinks about .6 ounces of alcohol per week. He reports that he does not use illicit drugs. Family History  Problem Relation Age of Onset  . Heart disease Mother     MI  . Lung cancer Brother      Living Arrangements: Alone   Abuse/Neglect [facility]) Physical Abuse: Denies Verbal Abuse: Denies Sexual Abuse: Denies Allergies:   Allergies  Allergen Reactions  . Ativan [Lorazepam] Other (See Comments)    Makes patient very weak and cant mentate appropriately  . Tylenol [Acetaminophen] Other (See Comments)    Aggravates liver problems  . Droperidol Hives  . Penicillins Swelling  . Toradol [Ketorolac Tromethamine] Hives     ACT Assessment Complete:  No Objective: Blood pressure 116/56, pulse 83, temperature 98.1 F (36.7 C), temperature source Oral, resp. rate 20, height _0  (1.753 m), weight 120.294 kg (265 lb 3.2 oz), SpO2 91.00%.Body mass index is 39.15 kg/(m^2). Results for orders placed during the hospital encounter of 07/23/13 (from the past 72 hour(s))  GLUCOSE, CAPILLARY     Status: Abnormal   Collection Time    08/02/13  4:11 PM      Result Value Ref Range   Glucose-Capillary 222 (*) 70 - 99 mg/dL  GLUCOSE, CAPILLARY     Status: Abnormal   Collection Time    08/02/13 10:24 PM      Result Value Ref Range   Glucose-Capillary 135 (*) 70 - 99 mg/dL  BASIC METABOLIC PANEL     Status: Abnormal   Collection Time    08/03/13  4:50 AM      Result Value Ref Range   Sodium 138  137 - 147 mEq/L   Potassium 3.7  3.7 - 5.3 mEq/L   Chloride 97  96 - 112 mEq/L   CO2 35 (*) 19 - 32 mEq/L   Glucose, Bld 113 (*) 70 - 99 mg/dL   BUN 8  6 - 23 mg/dL   Creatinine, Ser 0.55  0.50 - 1.35 mg/dL   Calcium 7.9 (*) 8.4 - 10.5 mg/dL   GFR calc non Af Amer >90  >90 mL/min   GFR calc Af Amer >90  >90 mL/min   Comment: (NOTE)     The eGFR has been calculated using the CKD EPI equation.     This calculation has not been validated in all clinical situations.     eGFR's persistently <90 mL/min signify possible Chronic Kidney     Disease.  CBC     Status: Abnormal   Collection Time    08/03/13  4:50 AM      Result Value Ref Range   WBC 5.0  4.0 - 10.5 K/uL   RBC 2.35 (*) 4.22 - 5.81 MIL/uL   Hemoglobin 8.1 (*) 13.0 - 17.0 g/dL   HCT 25.1 (*) 39.0 - 52.0 %   MCV 106.8 (*) 78.0 - 100.0 fL   MCH 34.5 (*) 26.0 - 34.0 pg   MCHC 32.3  30.0 - 36.0 g/dL   RDW 21.8 (*) 11.5 - 15.5 %   Platelets 49 (*) 150 - 400 K/uL   Comment: CONSISTENT WITH PREVIOUS RESULT  GLUCOSE, CAPILLARY     Status: Abnormal   Collection Time    08/03/13  7:39 AM      Result Value Ref Range   Glucose-Capillary 111 (*) 70 -  99 mg/dL    Comment 1 Notify RN    GLUCOSE, CAPILLARY     Status: Abnormal   Collection Time    08/03/13 11:46 AM      Result Value Ref Range   Glucose-Capillary 197 (*) 70 - 99 mg/dL   Comment 1 Notify RN    GLUCOSE, CAPILLARY     Status: Abnormal   Collection Time    08/03/13 12:26 PM      Result Value Ref Range   Glucose-Capillary 202 (*) 70 - 99 mg/dL  GLUCOSE, CAPILLARY     Status: Abnormal   Collection Time    08/03/13  5:46 PM      Result Value Ref Range   Glucose-Capillary 176 (*) 70 - 99 mg/dL   Comment 1 Notify RN    GLUCOSE, CAPILLARY     Status: Abnormal   Collection Time    08/03/13  9:58 PM      Result Value Ref Range   Glucose-Capillary 146 (*) 70 - 99 mg/dL  GLUCOSE, CAPILLARY     Status: Abnormal   Collection Time    08/04/13  7:36 AM      Result Value Ref Range   Glucose-Capillary 137 (*) 70 - 99 mg/dL  GLUCOSE, CAPILLARY     Status: Abnormal   Collection Time    08/04/13 11:26 AM      Result Value Ref Range   Glucose-Capillary 149 (*) 70 - 99 mg/dL  GLUCOSE, CAPILLARY     Status: Abnormal   Collection Time    08/04/13  5:28 PM      Result Value Ref Range   Glucose-Capillary 197 (*) 70 - 99 mg/dL  GLUCOSE, CAPILLARY     Status: Abnormal   Collection Time    08/04/13 10:00 PM      Result Value Ref Range   Glucose-Capillary 206 (*) 70 - 99 mg/dL  CBC     Status: Abnormal   Collection Time    08/05/13  4:25 AM      Result Value Ref Range   WBC 3.9 (*) 4.0 - 10.5 K/uL   RBC 2.47 (*) 4.22 - 5.81 MIL/uL   Hemoglobin 8.8 (*) 13.0 - 17.0 g/dL   HCT 27.0 (*) 39.0 - 52.0 %   MCV 109.3 (*) 78.0 - 100.0 fL   MCH 35.6 (*) 26.0 - 34.0 pg   MCHC 32.6  30.0 - 36.0 g/dL   RDW 22.8 (*) 11.5 - 15.5 %   Platelets 48 (*) 150 - 400 K/uL   Comment: CONSISTENT WITH PREVIOUS RESULT  COMPREHENSIVE METABOLIC PANEL     Status: Abnormal   Collection Time    08/05/13  4:25 AM      Result Value Ref Range   Sodium 139  137 - 147 mEq/L   Potassium 3.8  3.7 - 5.3 mEq/L   Chloride  99  96 - 112 mEq/L   CO2 34 (*) 19 - 32 mEq/L   Glucose, Bld 138 (*) 70 - 99 mg/dL   BUN 7  6 - 23 mg/dL   Creatinine, Ser 0.53  0.50 - 1.35 mg/dL   Calcium 8.5  8.4 - 10.5 mg/dL   Total Protein 6.0  6.0 - 8.3 g/dL   Albumin 2.4 (*) 3.5 - 5.2 g/dL   AST 38 (*) 0 - 37 U/L   ALT 14  0 - 53 U/L   Alkaline Phosphatase 171 (*) 39 - 117 U/L   Total Bilirubin 5.9 (*)  0.3 - 1.2 mg/dL   GFR calc non Af Amer >90  >90 mL/min   GFR calc Af Amer >90  >90 mL/min   Comment: (NOTE)     The eGFR has been calculated using the CKD EPI equation.     This calculation has not been validated in all clinical situations.     eGFR's persistently <90 mL/min signify possible Chronic Kidney     Disease.  GLUCOSE, CAPILLARY     Status: Abnormal   Collection Time    08/05/13  7:44 AM      Result Value Ref Range   Glucose-Capillary 129 (*) 70 - 99 mg/dL  GLUCOSE, CAPILLARY     Status: Abnormal   Collection Time    08/05/13 11:51 AM      Result Value Ref Range   Glucose-Capillary 141 (*) 70 - 99 mg/dL   Labs are reviewed and are pertinent for .  Current Facility-Administered Medications  Medication Dose Route Frequency Provider Last Rate Last Dose  . 0.9 %  sodium chloride infusion   Intravenous Continuous Wilhelmina Mcardle, MD 10 mL/hr at 08/01/13 0700    . albuterol (PROVENTIL) (2.5 MG/3ML) 0.083% nebulizer solution 2.5 mg  2.5 mg Inhalation Q2H PRN Chesley Mires, MD      . feeding supplement (ENSURE COMPLETE) (ENSURE COMPLETE) liquid 237 mL  237 mL Oral BID BM Heather Cornelison Pitts, RD   237 mL at 08/05/13 1130  . folic acid (FOLVITE) tablet 1 mg  1 mg Oral Daily Doree Fudge, MD   1 mg at 08/05/13 1131  . furosemide (LASIX) injection 40 mg  40 mg Intravenous Q12H Bonnielee Haff, MD   40 mg at 08/05/13 0533  . insulin aspart (novoLOG) injection 0-20 Units  0-20 Units Subcutaneous TID WC Annita Brod, MD      . insulin aspart (novoLOG) injection 0-5 Units  0-5 Units Subcutaneous QHS Annita Brod, MD      . iron polysaccharides (NIFEREX) capsule 150 mg  150 mg Oral BID Barton Dubois, MD   150 mg at 08/05/13 1131  . lactulose (CHRONULAC) 10 GM/15ML solution 40 g  40 g Oral BID Chesley Mires, MD   40 g at 08/05/13 1132  . levETIRAcetam (KEPPRA) tablet 1,000 mg  1,000 mg Oral BID Doree Fudge, MD   1,000 mg at 08/05/13 1131  . levothyroxine (SYNTHROID, LEVOTHROID) tablet 75 mcg  75 mcg Oral QAC breakfast Velvet Bathe, MD   75 mcg at 08/05/13 1130  . multivitamin with minerals tablet 1 tablet  1 tablet Oral q morning - 10a Velvet Bathe, MD   1 tablet at 08/05/13 1130  . ondansetron (ZOFRAN) injection 4 mg  4 mg Intravenous Q6H PRN Naiping Eduard Roux, MD      . oxyCODONE (Oxy IR/ROXICODONE) immediate release tablet 20 mg  20 mg Oral Q4H PRN Samella Parr, NP   20 mg at 08/05/13 1132  . pantoprazole (PROTONIX) EC tablet 40 mg  40 mg Oral Daily Chesley Mires, MD   40 mg at 08/05/13 1131  . PARoxetine (PAXIL) tablet 20 mg  20 mg Oral Daily Velvet Bathe, MD   20 mg at 08/05/13 1131  . potassium chloride SA (K-DUR,KLOR-CON) CR tablet 40 mEq  40 mEq Oral Daily Barton Dubois, MD   40 mEq at 08/05/13 1131  . rifaximin (XIFAXAN) tablet 550 mg  550 mg Oral BID Velvet Bathe, MD   550 mg at 08/05/13 1130  . sodium chloride  0.9 % injection 10-40 mL  10-40 mL Intracatheter Q12H Barton Dubois, MD      . sodium chloride 0.9 % injection 10-40 mL  10-40 mL Intracatheter PRN Barton Dubois, MD   10 mL at 08/05/13 0421  . spironolactone (ALDACTONE) tablet 25 mg  25 mg Oral BID Barton Dubois, MD   25 mg at 08/05/13 1130  . thiamine (VITAMIN B-1) tablet 100 mg  100 mg Oral Daily Doree Fudge, MD   100 mg at 08/05/13 1130    Psychiatric Specialty Exam: Physical Exam  ROS  Blood pressure 116/56, pulse 83, temperature 98.1 F (36.7 C), temperature source Oral, resp. rate 20, height _0  (1.753 m), weight 120.294 kg (265 lb 3.2 oz), SpO2 91.00%.Body mass index is 39.15 kg/(m^2).   General Appearance: Casual  Eye Contact::  Good  Speech:  Clear and Coherent  Volume:  Normal  Mood:  Anxious  Affect:  Depressed  Thought Process:  Coherent and Goal Directed  Orientation:  Full (Time, Place, and Person)  Thought Content:  WDL  Suicidal Thoughts:  No  Homicidal Thoughts:  No  Memory:  Immediate;   Good  Judgement:  Impaired  Insight:  Lacking  Psychomotor Activity:  Psychomotor Retardation  Concentration:  Fair  Recall:  [place] of Knowledge:Fair  Language: Good  Akathisia:  NA  Handed:  Right  AIMS (if indicated):     Assets:  Communication Skills Desire for Improvement Financial Resources/Insurance Housing Intimacy Leisure Time Resilience Social Support Talents/Skills  Sleep:      Musculoskeletal: Strength & Muscle Tone: within normal limits Gait & Station: normal Patient leans: Right  Treatment Plan Summary: Daily contact with patient to assess and evaluate symptoms and progress in treatment Medication management  Tony Boyd,Tony R. 08/05/2013 12:32 PM

## 2013-08-06 LAB — GLUCOSE, CAPILLARY
Glucose-Capillary: 128 mg/dL — ABNORMAL HIGH (ref 70–99)
Glucose-Capillary: 135 mg/dL — ABNORMAL HIGH (ref 70–99)
Glucose-Capillary: 182 mg/dL — ABNORMAL HIGH (ref 70–99)
Glucose-Capillary: 172 mg/dL — ABNORMAL HIGH (ref 70–99)

## 2013-08-06 MED ORDER — LEVETIRACETAM 500 MG PO TABS: 1000.0000 mg | ORAL_TABLET | Freq: Two times a day (BID) | ORAL | Status: AC

## 2013-08-06 MED ORDER — INSULIN GLARGINE 100 UNIT/ML SOLOSTAR PEN: 5.0000 [IU] | PEN_INJECTOR | Freq: Every day | SUBCUTANEOUS | Status: AC

## 2013-08-06 MED ORDER — ENSURE COMPLETE PO LIQD: 237.0000 mL | Freq: Two times a day (BID) | ORAL | Status: DC

## 2013-08-06 MED ORDER — PAROXETINE HCL 20 MG PO TABS: 20.0000 mg | ORAL_TABLET | Freq: Every day | ORAL | Status: AC

## 2013-08-06 MED ORDER — FUROSEMIDE 40 MG PO TABS
60.0000 mg | ORAL_TABLET | Freq: Two times a day (BID) | ORAL | Status: DC
  Administered 2013-08-06 – 2013-08-07 (×3): 60 mg via ORAL
  Filled 2013-08-06 (×4): qty 1

## 2013-08-06 MED ORDER — RIFAXIMIN 550 MG PO TABS: 550.0000 mg | ORAL_TABLET | Freq: Two times a day (BID) | ORAL | Status: DC

## 2013-08-06 MED ORDER — ALPRAZOLAM 0.5 MG PO TABS
1.0000 mg | ORAL_TABLET | Freq: Every evening | ORAL | Status: DC | PRN
  Administered 2013-08-06 – 2013-08-07 (×2): 1 mg via ORAL
  Filled 2013-08-06 (×2): qty 2

## 2013-08-06 NOTE — Discharge Summary (Signed)
Physician Discharge Summary  Tony Boyd DVV:[id number] DOB: [birth year] DOA: 07/23/2013  PCP: Hollace Kinnier, DO  Admit date: 07/23/2013 Discharge date: 08/06/2013  Time spent: 25 minutes  Recommendations for Outpatient Follow-up:  1. Patient advised to go to short-term skilled nursing which he adamantly refused 2. Patient will be discharged home with home physical therapy 3. Medication change: Patient's Lantus been decreased to 5 units each bedtime 4. He'll follow up with orthopedic surgery in 2 weeks  Discharge Diagnoses:  Active Problems:   ETOH abuse   Hepatitis C   Cirrhosis   Seizure disorder   Hypothyroidism   Diabetes mellitus type 2, uncontrolled, with complications   Ascites   Chronic back pain   Fracture, intertrochanteric, left femur   Respiratory failure   Hypotension   Encephalopathy acute   Discharge Condition: Improved, being discharged home  Diet recommendation: Carb modified heart healthy  Filed Weights   08/01/13 2126 08/02/13 2226 08/04/13 2100  Weight: 123.333 kg (271 lb 14.4 oz) 124.785 kg (275 lb 1.6 oz) 120.294 kg (265 lb 3.2 oz)    History of present illness:  58 y.o. male with a past medical history of cirrhosis, anasarca, hypothyroidism, portal hypertension gastropathy. He presented to the hospital after mechanical fall involving his walker. He fell on his left side and subsequently developed left lower extremity discomfort. In the ED had x-ray which showed left intratrochanteric femur fracture. Also had chest x-ray reporting either left contusion versus pneumonia. Felt to be left pulmonary contusion given history of recent fall and as such antibiotics were not initiated. He received FFP's for INR of 2.2.  Hospital Course:  He underwent surgery to fix the hip on 5/29. He had significant blood loss with surgery and was transferred to the ICU intubated. A central line was placed in Right IJ. He was transfused PRBC's. He required pressors for a brief  period and required an arterial line as well. He remained confused. He pulled out his central line on 6/2 resulting in more bleeding. He also developed severe thrombocytopenia and had to be transfused platelets. He was seen by CIR but not felt a good candidate for inpatient rehab. A PICC line was placed to right basilic vein on 6/4 due to poor venous access. He was started on aggressive IV diuresis due to significant volume overload from multiple transfusions and underlying liver cirrhosis. Plan was for the patient to be discharged to SNF but he has been refusing. Due to his periods of confusion we obtained a psychiatric assessment during which he was found to lack capacity. Multiple attempts to reach his daughters were unsuccessful. Psychiatry followed up on 6/10 and felt that patient's mentation had improved in that he now had better capacity. At this point, he felt medically stable and since he still refuses to go to short-term skilled nursing be discharged home. We have set up home physical therapy   Active Problems:   ETOH abuse: Alcohol calcium was provided. Patient was monitored for withdrawals closely. He received also vitamin supplementation.    Hepatitis C   Cirrhosis patient on admission was found to have anasarca and fluid overload. He was continuously diuresed with Lasix as well as increased on his home dose of Aldactone during his hospitalization. By time of discharge, he was down 25 L. Plan will be done discharge resume normal home dose of Lasix and Aldactone. He'll also continue rifaximin and lactulose. Ammonia levels during his hospitalization have been normal.    Seizure disorder: Stable. Continue on Keppra.  No acute episodes during his hospitalization.    Hypothyroidism: Continue on Synthroid.    Diabetes mellitus type 2, controlled with complications: Patient was on sliding scale mostly during his hospitalization. At the very end of his hospitalization, his appetite started to  improve and occasionally had some elevated blood sugars in the 170s-200s.  His A1c was noted to be around 5.6. Concerns for home hypoglycemia. He decreased his home dose of Lantus to 5 units each bedtime.  Depression/anxiety: Continue on Risperdal and Paxil    Chronic back pain: Continue home pain medications.    Fracture, intertrochanteric, left femur: As above. Patient will followup with orthopedic surgery as outpatient.    Respiratory failure: As above. Resolved.   Hypotension: As above. Resolved.   Encephalopathy acute  Pancytopenia: Secondary to cirrhosis. Patient was continued on SCDs and avoided on heparin products. He received 1 units of platelets on 6/4 when his platelet count was as low as 8. He had no active bleeding. Platelet count on discharge was 48  Acute blood loss anemia in the setting of chronic macrocytic anemia: patient received packed red blood cells following surgery. His hemoglobin has remained stable and currently at 8.8 on discharge. His underlying macrocytosis likely secondary to heavy alcohol abuse   Procedures:  Status post placement of PICC line-discontinued prior to discharge  Status post left hip intertrochanteric nailing  Consultations:  Critical care  Orthopedics  Discharge Exam: Filed Vitals:   08/06/13 0900  BP: 116/70  Pulse: 85  Temp: 98.7 F (37.1 C)  Resp: 17    General: Alert and oriented x2, no acute distress-is a little bit off on the date Cardiovascular: Regular rate and rhythm, S1-S2 Respiratory: Clear to auscultation bilaterally  Discharge Instructions You were cared for by a hospitalist during your hospital stay. If you have any questions about your discharge medications or the care you received while you were in the hospital after you are discharged, you can call the unit and asked to speak with the hospitalist on call if the hospitalist that took care of you is not available. Once you are discharged, your primary care  physician will handle any further medical issues. Please note that NO REFILLS for any discharge medications will be authorized once you are discharged, as it is imperative that you return to your primary care physician (or establish a relationship with a primary care physician if you do not have one) for your aftercare needs so that they can reassess your need for medications and monitor your lab values.  Discharge Instructions   Diet - low sodium heart healthy    Complete by:  As directed      Increase activity slowly    Complete by:  As directed      Weight bearing as tolerated    Complete by:  As directed             Medication List    STOP taking these medications       insulin glargine 100 UNIT/ML injection  Commonly known as:  LANTUS  Replaced by:  Insulin Glargine 100 UNIT/ML Solostar Pen      TAKE these medications       albuterol 108 (90 BASE) MCG/ACT inhaler  Commonly known as:  PROVENTIL HFA;VENTOLIN HFA  Inhale 1-2 puffs into the lungs every 6 (six) hours as needed for wheezing or shortness of breath.     ALPRAZolam 1 MG tablet  Commonly known as:  XANAX  Take 1 tablet (1  mg total) by mouth at bedtime as needed for anxiety.     feeding supplement (ENSURE COMPLETE) Liqd  Take 237 mLs by mouth 2 (two) times daily between meals.     ferrous sulfate 325 (65 FE) MG tablet  Take 1 tablet (325 mg total) by mouth at bedtime.     folic acid 1 MG tablet  Commonly known as:  FOLVITE  Take 1 tablet (1 mg total) by mouth every morning.     furosemide 20 MG tablet  Commonly known as:  LASIX  Take 3 tablets (60 mg total) by mouth 2 (two) times daily.     Insulin Glargine 100 UNIT/ML Solostar Pen  Commonly known as:  LANTUS SOLOSTAR  Inject 5 Units into the skin daily at 10 pm.     lactulose 10 GM/15ML solution  Commonly known as:  CHRONULAC  Take 60 mLs (40 g total) by mouth 3 (three) times daily.     levETIRAcetam 500 MG tablet  Commonly known as:  KEPPRA  Take 2  tablets (1,000 mg total) by mouth 2 (two) times daily.     levothyroxine 75 MCG tablet  Commonly known as:  SYNTHROID, LEVOTHROID  Take 75 mcg by mouth daily before breakfast.     multivitamin with minerals Tabs tablet  Take 1 tablet by mouth every morning.     nadolol 40 MG tablet  Commonly known as:  CORGARD  Take 1 tablet (40 mg total) by mouth every morning.     omeprazole 40 MG capsule  Commonly known as:  PRILOSEC  Take 1 capsule (40 mg total) by mouth daily.     Oxycodone HCl 10 MG Tabs  Take 20 mg by mouth 5 (five) times daily.     oxyCODONE 5 MG immediate release tablet  Commonly known as:  Oxy IR/ROXICODONE  Take 1-3 tablets (5-15 mg total) by mouth every 4 (four) hours as needed.     PARoxetine 20 MG tablet  Commonly known as:  PAXIL  Take 1 tablet (20 mg total) by mouth daily.     rifaximin 550 MG Tabs tablet  Commonly known as:  XIFAXAN  Take 1 tablet (550 mg total) by mouth 2 (two) times daily.     risperiDONE 0.25 MG tablet  Commonly known as:  RISPERDAL  Take 1 tablet (0.25 mg total) by mouth 2 (two) times daily.     spironolactone 100 MG tablet  Commonly known as:  ALDACTONE  Take 2 tablets (200 mg total) by mouth every morning.       Allergies  Allergen Reactions  . Ativan [Lorazepam] Other (See Comments)    Makes patient very weak and cant mentate appropriately  . Tylenol [Acetaminophen] Other (See Comments)    Aggravates liver problems  . Droperidol Hives  . Penicillins Swelling  . Toradol [Ketorolac Tromethamine] Hives       Follow-up Information   Follow up with Marianna Payment, MD In 2 weeks.   Specialty:  Orthopedic Surgery   Contact information:   [place] [id number] [phone]        The results of significant diagnostics from this hospitalization (including imaging, microbiology, ancillary and laboratory) are listed below for reference.    Significant Diagnostic Studies: Dg Chest 1  View  07/23/2013     IMPRESSION: Left mid lung airspace opacity, consistent pulmonary contusion or other pneumonia. Recommend chest radiographic followup in several weeks to confirm resolution.   Electronically Signed  By: Earle Gell M.D.   On: 07/23/2013 17:04   Dg Lumbar Spine 2-3 Views  07/23/2013     IMPRESSION: 1. The minimally displaced left-sided intertrochanteric femur fracture is not well demonstrated on the present examination. Please refer to dedicated pelvic and left femur radiographs performed earlier same day. 2. Interval development of a moderate (approximately 40%) compression deformity involving the L2 vertebral body - correlation for point tenderness at this location is recommended. 3. Unchanged mild (< 25%) compression deformity involving the superior endplate of the L4 vertebral body. 4. Unchanged mild multilevel lumbar spine DDD, worse at L3-L4.   Electronically Signed   By: Sandi Mariscal M.D.   On: 07/23/2013 17:17   Dg Pelvis 1-2 Views  07/23/2013   CLINICAL DATA:  Fall, pain.  EXAM: PELVIS - 1-2 VIEW  COMPARISON:  Left femur series performed today.  FINDINGS: There is a left femoral intertrochanteric fracture. Mild lateral displacement. No subluxation or dislocation. Mild degenerative changes in the hips bilaterally.  IMPRESSION: Left femoral intertrochanteric fracture.   Electronically Signed   By: Rolm Baptise M.D.   On: 07/23/2013 17:06   Dg Femur Left  07/24/2013   CLINICAL DATA:  left femoral nail  EXAM: LEFT FEMUR - 2 VIEW  COMPARISON:  None.  FINDINGS: Images demonstrate placement of a dynamic compression screw an intra medullary rod within the left femur. Anatomic alignment. No breakage or loosening of the hardware.  IMPRESSION: ORIF left femur fracture.   Electronically Signed   By: Maryclare Bean M.D.   On: 07/24/2013 15:55   Dg Femur Left  07/23/2013   CLINICAL DATA:  Fall, pain.  EXAM: LEFT FEMUR - 2 VIEW  COMPARISON:  None.  FINDINGS: There is a left femoral  intertrochanteric fracture. Mild displacement and impaction. No subluxation or dislocation of the femoral head.  IMPRESSION: Mildly displaced and impacted left femoral intertrochanteric fracture.   Electronically Signed   By: Rolm Baptise M.D.   On: 07/23/2013 17:05   Ct Head Wo Contrast  07/23/2013     IMPRESSION: No acute intracranial pathology.   Electronically Signed   By: Kathreen Devoid   On: 07/23/2013 17:01   Ir Fluoro Guide Cv Line Right  07/30/2013   CLINICAL DATA:  Poor venous access, request for peripherally inserted central catheter.  EXAM: Right arm dual lumen power injectable PICC LINE PLACEMENT WITH ULTRASOUND AND FLUOROSCOPIC GUIDANCE  FLUOROSCOPY TIME:  18 seconds  PROCEDURE: The patient was advised of the possible risks and complications and agreed to undergo the procedure. The patient was then brought to the angiographic suite for the procedure.  The right arm was prepped with chlorhexidine, draped in the usual sterile fashion using maximum barrier technique (cap and mask, sterile gown, sterile gloves, large sterile sheet, hand hygiene and cutaneous antisepsis) and infiltrated locally with 1% Lidocaine.  Ultrasound demonstrated patency of the right basilic vein, and this was documented with an image. Under real-time ultrasound guidance, this vein was accessed with a 21 gauge micropuncture needle and image documentation was performed. A 0.018 wire was introduced in to the vein. Over this, a 5 [place] dual lumen power-injectable PICC was advanced to the lower SVC/right atrial junction. Fluoroscopy during the procedure and fluoro spot radiograph confirms appropriate catheter position. The catheter was flushed and covered with a sterile dressing.  Catheter length:  41 cm  Complications: None.  IMPRESSION: Successful right arm Power injectable PICC line placement with ultrasound and fluoroscopic guidance. The catheter is ready  for use.  Read By:  Tsosie Billing PA-C   Electronically Signed   By: Jacqulynn Cadet M.D.   On: 07/30/2013 14:27   Ir US Guide Vasc Access Right  07/30/2013   CLINICAL DATA:  Poor venous access, request for peripherally inserted central catheter.  EXAM: Right arm dual lumen power injectable PICC LINE PLACEMENT WITH ULTRASOUND AND FLUOROSCOPIC GUIDANCE  FLUOROSCOPY TIME:  18 seconds  PROCEDURE: The patient was advised of the possible risks and complications and agreed to undergo the procedure. The patient was then brought to the angiographic suite for the procedure.  The right arm was prepped with chlorhexidine, draped in the usual sterile fashion using maximum barrier technique (cap and mask, sterile gown, sterile gloves, large sterile sheet, hand hygiene and cutaneous antisepsis) and infiltrated locally with 1% Lidocaine.  Ultrasound demonstrated patency of the right basilic vein, and this was documented with an image. Under real-time ultrasound guidance, this vein was accessed with a 21 gauge micropuncture needle and image documentation was performed. A 0.018 wire was introduced in to the vein. Over this, a 5 [place] dual lumen power-injectable PICC was advanced to the lower SVC/right atrial junction. Fluoroscopy during the procedure and fluoro spot radiograph confirms appropriate catheter position. The catheter was flushed and covered with a sterile dressing.  Catheter length:  41 cm  Complications: None.  IMPRESSION: Successful right arm Power injectable PICC line placement with ultrasound and fluoroscopic guidance. The catheter is ready for use.  Read By:  Tsosie Billing PA-C   Electronically Signed   By: Jacqulynn Cadet M.D.   On: 07/30/2013 14:27   Dg Chest Port 1 View  07/28/2013   CLINICAL DATA:  Respiratory failure.  EXAM: PORTABLE CHEST - 1 VIEW  COMPARISON:  Single view of the chest 07/26/2013 and 07/25/2013.  FINDINGS: Right IJ catheter remains in place, unchanged. There has been interval worsening in aeration in the right lower lung zone. Airspace disease in the left  mid and lower lung zones appears unchanged. No pneumothorax identified. Heart size is normal.  IMPRESSION: Worsened airspace disease in the right lower lung zone with no change on the left identified. Findings are compatible multifocal pneumonia and/or pulmonary edema.   Electronically Signed   By: Inge Rise M.D.   On: 07/28/2013 07:22   Dg Chest Port 1 View  07/26/2013   CLINICAL DATA:  Followup pleural effusion  EXAM: PORTABLE CHEST - 1 VIEW  COMPARISON:  07/25/2013  FINDINGS: Right jugular line is again identified and stable. The cardiac shadow is within normal limits. The overall inspiratory effort is poor. Persistent left basilar infiltrate and effusion is noted.  IMPRESSION: Persistent left infiltrate and effusion.   Electronically Signed   By: Inez Catalina M.D.   On: 07/26/2013 08:17   Dg Chest Port 1 View  07/25/2013  IMPRESSION: 1. Interval extubation and removal of enteric tube. Otherwise, stable positioning of remaining support apparatus. No pneumothorax. 2. Grossly unchanged findings of pulmonary edema and suspected small left-sided effusion. 3. No change to slight worsening of bilateral mid and lower lung heterogeneous opacities, left greater than right, atelectasis versus infiltrate / contusion.   Electronically Signed   By: Sandi Mariscal M.D.   On: 07/25/2013 11:29   Dg Chest Port 1 View  07/24/2013   IMPRESSION: Left basilar effusion with likely underlying atelectasis.  Tubes and lines as described. The endotracheal tube should be withdrawn 1-2 cm.  These results were called by telephone at the time of  interpretation on 07/24/2013 at 3:51 PM to [facility], the pts nurse, who verbally acknowledged these results.   Electronically Signed   By: Inez Catalina M.D.   On: 07/24/2013 15:51   Dg Knee Complete 4 Views Left  07/23/2013     IMPRESSION: 1. No acute fracture, malalignment or joint effusion. 2. Mild patellofemoral osteoarthritis. 3. Diffuse soft tissue edema versus cellulitis.    Electronically Signed   By: Jacqulynn Cadet M.D.   On: 07/23/2013 17:05   Dg C-arm 1-60 Min  07/24/2013   CLINICAL DATA:  Surgery in progress  EXAM: DG C-ARM 61-120 MIN  COMPARISON:  None.  FINDINGS: Fluoroscopy was utilized by the Orthopedic service.  IMPRESSION: See above.   Electronically Signed   By: Maryclare Bean M.D.   On: 07/24/2013 15:54    Microbiology: Recent Results (from the past 240 hour(s))  CLOSTRIDIUM DIFFICILE BY PCR     Status: None   Collection Time    07/30/13  9:29 AM      Result Value Ref Range Status   C difficile by pcr NEGATIVE  NEGATIVE Final     Labs: Basic Metabolic Panel:  Recent Labs Lab 08/01/13 0500 08/02/13 0410 08/03/13 0450 08/05/13 0425  NA 138 139 138 139  K 3.6* 3.8 3.7 3.8  CL 96 97 97 99  CO2 36* 33* 35* 34*  GLUCOSE 121* 149* 113* 138*  BUN 8 9 8 7   CREATININE 0.59 0.63 0.55 0.53  CALCIUM 8.0* 7.9* 7.9* 8.5   Liver Function Tests:  Recent Labs Lab 08/01/13 1125 08/02/13 0410 08/05/13 0425  AST 30 35 38*  ALT 13 12 14   ALKPHOS 126* 133* 171*  BILITOT 7.5* 7.1* 5.9*  PROT 5.5* 5.6* 6.0  ALBUMIN 2.4* 2.4* 2.4*   No results found for this basename: LIPASE, AMYLASE,  in the last 168 hours  Recent Labs Lab 08/01/13 1125 08/05/13 1152  AMMONIA 38 40   CBC:  Recent Labs Lab 07/31/13 0445 08/01/13 0500 08/02/13 0410 08/03/13 0450 08/05/13 0425  WBC 3.0* 4.2 4.1 5.0 3.9*  HGB 7.8* 7.8* 7.9* 8.1* 8.8*  HCT 24.4* 24.6* 24.8* 25.1* 27.0*  MCV 108.0* 107.4* 106.9* 106.8* 109.3*  PLT 49* 49* 49* 49* 48*   Cardiac Enzymes: No results found for this basename: CKTOTAL, CKMB, CKMBINDEX, TROPONINI,  in the last 168 hours BNP: BNP (last 3 results)  Recent Labs  05/31/13 2330 06/22/13 1738 06/30/13 2225  PROBNP 304.0* 296.3* 669.2*   CBG:  Recent Labs Lab 08/05/13 0744 08/05/13 1151 08/05/13 1634 08/05/13 2150 08/06/13 0741  GLUCAP 129* 141* 175* 195* 128*       Signed:  Lynnsie Linders K  [facility] 08/06/2013, 11:40 AM

## 2013-08-06 NOTE — Progress Notes (Signed)
[facility]  Patient Status: Active (receiving services up to time of hospitalization)  [facility] is providing the following services: RN  If patient discharges after hours, please call [phone].   Tony Boyd 08/06/2013, 10:55 AM

## 2013-08-06 NOTE — Progress Notes (Signed)
Occupational Therapy Treatment Patient Details Name: Tony Boyd MRN: [id number] DOB: 1955-07-29 Today's Date: 08/06/2013    History of present illness 58 yo former smoker with history of ETOH / Hepatitis liver disease fell at home resulting in L intertrochanteric fx and pulmonary contusion.  Had Nailing of hip on 5/29 associated with significant blood loss, and remained on vent post-op.   OT comments  Pt making slow progress with functional goals with pain and low motivation limiting participation. Pt refusing SNF and planning to d.c home and states that his daughters will only be able to check on him daily. Pt required max encouragment to participate in EOB activity. Fixated on calling his daughter for a ride home and moving to [place], [place] to be  with his new and pregnant wife (per pt report). OT will continue to follow  Follow Up Recommendations  SNF;Supervision/Assistance - 24 hour    Equipment Recommendations  3 in 1 bedside comode    Recommendations for Other Services      Precautions / Restrictions Precautions Precautions: Fall Precaution Comments: pt edematous everywhere, bilat UEs/LEs and abdomen Restrictions Weight Bearing Restrictions: Yes LLE Weight Bearing: Weight bearing as tolerated       Mobility Bed Mobility Overal bed mobility: Needs Assistance Bed Mobility: Supine to Sit;Sit to Supine     Supine to sit: Mod assist Sit to supine: Max assist   General bed mobility comments: (A) to advance Lt LE and bring trunk to upright position; incr time due to pain   Transfers                 General transfer comment: pt refused transfer to [facility], 2 perosn assist required    Balance Overall balance assessment: Needs assistance Sitting-balance support: Single extremity supported;Feet supported Sitting balance-Leahy Scale: Fair                             ADL Overall ADL's : Needs assistance/impaired     Grooming: Wash/dry face;Brushing  hair;Wash/dry hands;Set up;Min guard;Sitting;Cueing for sequencing   Upper Body Bathing: Cueing for sequencing;Set up;Min guard;Sitting       Upper Body Dressing : Sitting;Minimal assistance;Cueing for sequencing         Toilet Transfer Details (indicate cue type and reason): pt refused transfer to [facility]           General ADL Comments: Pt required increased time due to pain      Vision  no change from baseline per pt                   Perception Perception Perception Tested?: No         Cognition   Behavior During Therapy: WFL for tasks assessed/performed Overall Cognitive Status: No family/caregiver present to determine baseline cognitive functioning                Problem Solving: Slow processing;Difficulty sequencing;Requires verbal cues General Comments: pt required max encouragment to participate in EOB activity. Fixated on calling his daughter for a ride home                               General Comments  Pt required encouragement to partcipate    Pertinent Vitals/ Pain       6/10 L LE pain, VSS  Frequency Min 2X/week     Progress Toward Goals  OT Goals(current goals can now be found in the care plan section)  Progress towards OT goals: OT to reassess next treatment     Plan Discharge plan remains appropriate                      End of Session     Activity Tolerance Patient limited by pain   Patient Left in bed;with call bell/phone within reach;with bed alarm set             Time: 3614-4315 OT Time Calculation (min): 17 min  Charges: OT General Charges $OT Visit: 1 Procedure OT Treatments $Therapeutic Activity: 8-22 mins  Britt Bottom 08/06/2013, 2:08 PM

## 2013-08-06 NOTE — Clinical Social Work Note (Addendum)
CSW contacted [facility] non-emergency line [phone]) and spoke with Hinton Dyer regarding call made yesterday requesting a visit to [place]. Per Hinton Dyer, an officer went to the home and no one was there.  Call made to daughter Levin Erp [phone]) and message left.  Sargon Scouten Givens, MSW, LCSW [phone]

## 2013-08-06 NOTE — Consult Note (Signed)
[name]   Reason for Consult:  Capacity evaluation Referring Physician:  Dr. Jackqulyn Livings [name] is an 58 y.o. male. Total Time spent with patient: 45 minutes  Assessment: AXIS I:  Post Traumatic Stress Disorder, Substance Abuse and Substance Induced Mood Disorder AXIS II:  Deferred AXIS III:   Past Medical History  Diagnosis Date  . Cirrhosis of liver   . Hepatitis C   . ETOH abuse   . Hypothyroid   . Psychosis   . Bipolar disorder   . Seizures   . Arthritis     chronic back pain  . Coagulopathy onset prior to 2009  . Thrombocytopenia onsetprior to 2009  . Pancytopenia onset prior to 2009    with anemia, low platelets  . Hyponatremia onset prior to 2009  . Korsakoff psychosis   . H/O abdominal abscess 2012    abcess involving ventral hernia. drained 06/2010 by IR  . 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 Hepatic encephalopathy prior to 2009  . H/O renal failure   . Ascites 01/2013    too minor to tap on ultrasound  . Peripheral vascular disease   . GERD (gastroesophageal reflux disease)   . Hypothyroidism 06/02/2012  . Obesity   . Cellulitis and abscess of upper arm and forearm   . Chronic kidney disease, unspecified   . Type 2 diabetes mellitus with diabetic neuropathy   . Compression fracture of L3 lumbar vertebra 01/2013  . Hernia of abdominal wall 2012    multiple ventral hernias  . Anxiety   . Depression   . Fibromyalgia    AXIS IV:  other psychosocial or environmental problems, problems related to social environment and problems with primary support group AXIS V:  51-60 moderate symptoms  Plan:  Patient does  meet criteria for capacity to make his own medical decisions The patient can be discharged home when medically stable and followup of patient psychiatric services at [facility] for posttraumatic stress disorder No evidence of imminent risk to self or others at present.   Patient  does not meet criteria for psychiatric inpatient admission. Appreciate psychiatric consultation Please contact [phone] if needs further assistance  Subjective:   Tony Boyd is a 58 y.o. male patient admitted with depression and fall.  HPI: Patient was seen, chart reviewed and case discussed with Dr. [place] Pink. 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 Patient has been receiving medication management from [facility] in [place]. Patient has no previous acute psychiatric hospitalization. Patient has limited cognition and has poor orientation, okay we'll concentration and language. Patient does not understand his current medical condition in full length and the necessary treatments recommendations. Patient has minimizing his capacity to be on his own and has a limited family support. Based on my evaluation today patient does not meet criteria for capacity to make his own medical decisions and required medical care power of attorney which can be assisted by psychiatric social service with the help of family members if needed. I have tried to reach family members especially his doctors without success. Social service also trying to reach without success and we will keep trying to reach the family for appropriate disposition plans.  Internal history: Patient has showed significant clinical improvement on mental status improvement. Patient has intact cognition and has no safety concerns. Patient denies  current symptoms of depression, anxiety, Suicidal ideation, intents or plans. Patient has no evidence of psychotic symptoms during this visit. Patient stated that he has been waiting for his daughter to take him home.   Medical history: 58 y.o. male With Past medical history of cirrhosis, anasarca, hypothyroidism, portal hypertension gastropathy. Presented to the  hospital after mechanical fall involving his walker. He fell on his left side and subsequently developed left lower extremity discomfort to was found by his daughter after being on floor for a prolonged period of time. Had discomfort when trying to bear weight on his left lower extremity. In the ED had x-ray which showed left intratrochanteric femur fracture. Also had chest x-ray reporting either left contusion versus pneumonia. Felt to be left pulmonary contusion given history of recent fall as such antibiotics were not initiated.  Case was discussed with orthopedic surgeons recommended transitioning to [facility] for further evaluation and recommendations. We were consulted for further medical management   Review of Systems:  Constitutional:  No weight loss, night sweats, Fevers, chills, fatigue.  HEENT:  No headaches, Difficulty swallowing,Tooth/dental problems,Sore throat,  No sneezing, itching, ear ache, nasal congestion, post nasal drip,  Cardio-vascular:  No chest pain, Orthopnea, PND, swelling in lower extremities, anasarca, dizziness, palpitations  GI:  No heartburn, indigestion, abdominal pain, nausea, vomiting, + diarrhea, change in bowel habits, loss of appetite  Resp:  No shortness of breath with exertion or at rest. No excess mucus, no productive cough, No non-productive cough, No coughing up of blood.No change in color of mucus.No wheezing.No chest wall deformity  Skin:  no rash or lesions.  GU:  no dysuria, change in color of urine, no urgency or frequency. No flank pain.  Musculoskeletal:  No joint pain or swelling. + decreased range of motion. No back pain.  Psych:  No change in mood or affect. No depression or anxiety. No memory loss.   HPI Elements:   Location:  Depression, anxiety. Quality:  Poor. Severity:  Moderate. Timing:  No acute stressors except medical problems secondary to fall and being noncompliant with medications.  Past Psychiatric History: Past Medical  History  Diagnosis Date  . Cirrhosis of liver   . Hepatitis C   . ETOH abuse   . Hypothyroid   . Psychosis   . Bipolar disorder   . Seizures   . Arthritis     chronic back pain  . Coagulopathy onset prior to 2009  . Thrombocytopenia onsetprior to 2009  . Pancytopenia onset prior to 2009    with anemia, low platelets  . Hyponatremia onset prior to 2009  . Korsakoff psychosis   . H/O abdominal abscess 2012    abcess involving ventral hernia. drained 06/2010 by IR  . 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 Hepatic encephalopathy prior to 2009  . H/O renal failure   . Ascites 01/2013    too minor to tap on ultrasound  . Peripheral vascular disease   . GERD (gastroesophageal reflux disease)   . Hypothyroidism 06/02/2012  . Obesity   . Cellulitis and abscess of upper arm and forearm   . Chronic kidney disease, unspecified   . Type 2 diabetes mellitus with diabetic neuropathy   . Compression fracture of L3 lumbar vertebra 01/2013  . Hernia of abdominal wall 2012    multiple ventral hernias  . Anxiety   . Depression   . Fibromyalgia     reports that he  quit smoking about 5 years ago. His smoking use included Cigarettes. He smoked 0.00 packs per day. He has never used smokeless tobacco. He reports that he drinks about .6 ounces of alcohol per week. He reports that he does not use illicit drugs. Family History  Problem Relation Age of Onset  . Heart disease Mother     MI  . Lung cancer Brother      Living Arrangements: Alone   Abuse/Neglect [facility]) Physical Abuse: Denies Verbal Abuse: Denies Sexual Abuse: Denies Allergies:   Allergies  Allergen Reactions  . Ativan [Lorazepam] Other (See Comments)    Makes patient very weak and cant mentate appropriately  . Tylenol [Acetaminophen] Other (See Comments)    Aggravates liver problems  . Droperidol Hives  . Penicillins Swelling  . Toradol [Ketorolac Tromethamine] Hives    ACT  Assessment Complete:  No Objective: Blood pressure 116/70, pulse 85, temperature 98.7 F (37.1 C), temperature source Oral, resp. rate 17, height 5' 9" (1.753 m), weight 120.294 kg (265 lb 3.2 oz), SpO2 93.00%.Body mass index is 39.15 kg/(m^2). Results for orders placed during the hospital encounter of 07/23/13 (from the past 72 hour(s))  GLUCOSE, CAPILLARY     Status: Abnormal   Collection Time    08/03/13  5:46 PM      Result Value Ref Range   Glucose-Capillary 176 (*) 70 - 99 mg/dL   Comment 1 Notify RN    GLUCOSE, CAPILLARY     Status: Abnormal   Collection Time    08/03/13  9:58 PM      Result Value Ref Range   Glucose-Capillary 146 (*) 70 - 99 mg/dL  GLUCOSE, CAPILLARY     Status: Abnormal   Collection Time    08/04/13  7:36 AM      Result Value Ref Range   Glucose-Capillary 137 (*) 70 - 99 mg/dL  GLUCOSE, CAPILLARY     Status: Abnormal   Collection Time    08/04/13 11:26 AM      Result Value Ref Range   Glucose-Capillary 149 (*) 70 - 99 mg/dL  GLUCOSE, CAPILLARY     Status: Abnormal   Collection Time    08/04/13  5:28 PM      Result Value Ref Range   Glucose-Capillary 197 (*) 70 - 99 mg/dL  GLUCOSE, CAPILLARY     Status: Abnormal   Collection Time    08/04/13 10:00 PM      Result Value Ref Range   Glucose-Capillary 206 (*) 70 - 99 mg/dL  CBC     Status: Abnormal   Collection Time    08/05/13  4:25 AM      Result Value Ref Range   WBC 3.9 (*) 4.0 - 10.5 K/uL   RBC 2.47 (*) 4.22 - 5.81 MIL/uL   Hemoglobin 8.8 (*) 13.0 - 17.0 g/dL   HCT 27.0 (*) 39.0 - 52.0 %   MCV 109.3 (*) 78.0 - 100.0 fL   MCH 35.6 (*) 26.0 - 34.0 pg   MCHC 32.6  30.0 - 36.0 g/dL   RDW 22.8 (*) 11.5 - 15.5 %   Platelets 48 (*) 150 - 400 K/uL   Comment: CONSISTENT WITH PREVIOUS RESULT  COMPREHENSIVE METABOLIC PANEL     Status: Abnormal   Collection Time    08/05/13  4:25 AM      Result Value Ref Range   Sodium 139  137 - 147 mEq/L   Potassium 3.8  3.7 - 5.3 mEq/L  Chloride 99  96 - 112  mEq/L   CO2 34 (*) 19 - 32 mEq/L   Glucose, Bld 138 (*) 70 - 99 mg/dL   BUN 7  6 - 23 mg/dL   Creatinine, Ser 0.53  0.50 - 1.35 mg/dL   Calcium 8.5  8.4 - 10.5 mg/dL   Total Protein 6.0  6.0 - 8.3 g/dL   Albumin 2.4 (*) 3.5 - 5.2 g/dL   AST 38 (*) 0 - 37 U/L   ALT 14  0 - 53 U/L   Alkaline Phosphatase 171 (*) 39 - 117 U/L   Total Bilirubin 5.9 (*) 0.3 - 1.2 mg/dL   GFR calc non Af Amer >90  >90 mL/min   GFR calc Af Amer >90  >90 mL/min   Comment: (NOTE)     The eGFR has been calculated using the CKD EPI equation.     This calculation has not been validated in all clinical situations.     eGFR's persistently <90 mL/min signify possible Chronic Kidney     Disease.  GLUCOSE, CAPILLARY     Status: Abnormal   Collection Time    08/05/13  7:44 AM      Result Value Ref Range   Glucose-Capillary 129 (*) 70 - 99 mg/dL  GLUCOSE, CAPILLARY     Status: Abnormal   Collection Time    08/05/13 11:51 AM      Result Value Ref Range   Glucose-Capillary 141 (*) 70 - 99 mg/dL  AMMONIA     Status: None   Collection Time    08/05/13 11:52 AM      Result Value Ref Range   Ammonia 40  11 - 60 umol/L  GLUCOSE, CAPILLARY     Status: Abnormal   Collection Time    08/05/13  4:34 PM      Result Value Ref Range   Glucose-Capillary 175 (*) 70 - 99 mg/dL  GLUCOSE, CAPILLARY     Status: Abnormal   Collection Time    08/05/13  9:50 PM      Result Value Ref Range   Glucose-Capillary 195 (*) 70 - 99 mg/dL  GLUCOSE, CAPILLARY     Status: Abnormal   Collection Time    08/06/13  7:41 AM      Result Value Ref Range   Glucose-Capillary 128 (*) 70 - 99 mg/dL  GLUCOSE, CAPILLARY     Status: Abnormal   Collection Time    08/06/13 11:44 AM      Result Value Ref Range   Glucose-Capillary 135 (*) 70 - 99 mg/dL  GLUCOSE, CAPILLARY     Status: Abnormal   Collection Time    08/06/13  4:55 PM      Result Value Ref Range   Glucose-Capillary 182 (*) 70 - 99 mg/dL   Labs are reviewed and are pertinent for  .  Current Facility-Administered Medications  Medication Dose Route Frequency Provider Last Rate Last Dose  . 0.9 %  sodium chloride infusion   Intravenous Continuous Wilhelmina Mcardle, MD 10 mL/hr at 08/01/13 0700    . albuterol (PROVENTIL) (2.5 MG/3ML) 0.083% nebulizer solution 2.5 mg  2.5 mg Inhalation Q2H PRN Chesley Mires, MD      . feeding supplement (ENSURE COMPLETE) (ENSURE COMPLETE) liquid 237 mL  237 mL Oral BID BM Heather Cornelison Pitts, RD   237 mL at 08/06/13 1128  . folic acid (FOLVITE) tablet 1 mg  1 mg Oral Daily Doree Fudge, MD  1 mg at 08/06/13 1127  . furosemide (LASIX) tablet 60 mg  60 mg Oral BID Annita Brod, MD      . insulin aspart (novoLOG) injection 0-20 Units  0-20 Units Subcutaneous TID WC Annita Brod, MD   3 Units at 08/06/13 0851  . insulin aspart (novoLOG) injection 0-5 Units  0-5 Units Subcutaneous QHS Annita Brod, MD      . iron polysaccharides (NIFEREX) capsule 150 mg  150 mg Oral BID Barton Dubois, MD   150 mg at 08/06/13 1127  . lactulose (CHRONULAC) 10 GM/15ML solution 40 g  40 g Oral BID Chesley Mires, MD   40 g at 08/06/13 1127  . levETIRAcetam (KEPPRA) tablet 1,000 mg  1,000 mg Oral BID Doree Fudge, MD   1,000 mg at 08/06/13 1127  . levothyroxine (SYNTHROID, LEVOTHROID) tablet 75 mcg  75 mcg Oral QAC breakfast Velvet Bathe, MD   75 mcg at 08/06/13 0851  . multivitamin with minerals tablet 1 tablet  1 tablet Oral q morning - 10a Velvet Bathe, MD   1 tablet at 08/06/13 1128  . ondansetron (ZOFRAN) injection 4 mg  4 mg Intravenous Q6H PRN Naiping Eduard Roux, MD      . oxyCODONE (Oxy IR/ROXICODONE) immediate release tablet 20 mg  20 mg Oral Q4H PRN Samella Parr, NP   20 mg at 08/06/13 1609  . pantoprazole (PROTONIX) EC tablet 40 mg  40 mg Oral Daily Chesley Mires, MD   40 mg at 08/06/13 1128  . PARoxetine (PAXIL) tablet 20 mg  20 mg Oral Daily Velvet Bathe, MD   20 mg at 08/06/13 1128  . potassium chloride SA  (K-DUR,KLOR-CON) CR tablet 40 mEq  40 mEq Oral Daily Barton Dubois, MD   40 mEq at 08/06/13 1128  . rifaximin (XIFAXAN) tablet 550 mg  550 mg Oral BID Velvet Bathe, MD   550 mg at 08/06/13 1127  . sodium chloride 0.9 % injection 10-40 mL  10-40 mL Intracatheter Q12H Barton Dubois, MD      . sodium chloride 0.9 % injection 10-40 mL  10-40 mL Intracatheter PRN Barton Dubois, MD   10 mL at 08/05/13 1635  . spironolactone (ALDACTONE) tablet 25 mg  25 mg Oral BID Barton Dubois, MD   25 mg at 08/06/13 1128  . thiamine (VITAMIN B-1) tablet 100 mg  100 mg Oral Daily Doree Fudge, MD   100 mg at 08/06/13 1129    Psychiatric Specialty Exam: Physical Exam  ROS  Blood pressure 116/70, pulse 85, temperature 98.7 F (37.1 C), temperature source Oral, resp. rate 17, height 5' 9" (1.753 m), weight 120.294 kg (265 lb 3.2 oz), SpO2 93.00%.Body mass index is 39.15 kg/(m^2).  General Appearance: Casual  Eye Contact::  Good  Speech:  Clear and Coherent  Volume:  Normal  Mood:  Anxious  Affect:  Depressed  Thought Process:  Coherent and Goal Directed  Orientation:  Full (Time, Place, and Person)  Thought Content:  WDL  Suicidal Thoughts:  No  Homicidal Thoughts:  No  Memory:  Immediate;   Good  Judgement:  Impaired  Insight:  Lacking  Psychomotor Activity:  Psychomotor Retardation  Concentration:  Fair  Recall:  [place] of Knowledge:Fair  Language: Good  Akathisia:  NA  Handed:  Right  AIMS (if indicated):     Assets:  Communication Skills Desire for Improvement Financial Resources/Insurance Housing Intimacy Leisure Time Resilience Social Support Talents/Skills  Sleep:  Musculoskeletal: Strength & Muscle Tone: within normal limits Gait & Station: normal Patient leans: Right  Treatment Plan Summary: Daily contact with patient to assess and evaluate symptoms and progress in treatment Medication management  ,JANARDHAHA R. 08/06/2013 5:37 PM

## 2013-08-06 NOTE — Care Management Note (Signed)
CARE MANAGEMENT NOTE 08/06/2013  Patient:  Tony Boyd, Tony Boyd   Account Number:  [id number]  Date Initiated:  07/28/2013  Documentation initiated by:  Jasmine Pang  Subjective/Objective Assessment:   Pt transferred to [place] on 07/28/13, noted referral for [facility] and DME needs.     Action/Plan:   07/28/2013 CM to follow for progression and d/c planning. Will follow for progression and recommendations from PT/OT.  08/06/2013 Met with Pt who had [facility] services previouly and will use again.   Anticipated DC Date:  08/06/2013   Anticipated DC Plan:  [place]         Choice offered to / List presented to:          [facility] arranged  HH-1 RN  [place].   Status of service:  Completed, signed off Medicare Important Message given?  YES (If response is "NO", the following Medicare IM given date fields will be blank) Date Medicare IM given:  08/06/2013 Date Additional Medicare IM given:    Discharge Disposition:    Per UR Regulation:    If discussed at Long Length of Stay Meetings, dates discussed:    Comments:  08/06/2013 This CM expained IM letter in detail, however, pt refused to sign. Pt stating that he wishes to go home and is afraid the IM would prevent this. Thiscase manager was unable to reassue this pt, so IM was placed inb the record without a signature. Jasmine Pang RN MPH [phone] Pt states that he has two walkers and can get into his apartment. [facility] validated that this pt received HHRN and HHPT services and at that time had a rollator walker. CRoyal RN MPH addem : Multiple attempts to reach pt daughter, [name], @ [phone], this number was validated with [facility] as their contact number when previously providing [facility] services. Messages left on this number by this CM and CSW , Crawford Givens.  Pt states that he has DME and that his door is unlocked so that he can return home. He also insist that he has food in his refrigerator/freezer. As Psy  has determined that this pt has capacity and can make his own medical decision, the pt will d/c to home. As the CM for this pt I remain concerned that this pt will not be able to perform ADLs in his home environment , I have expressed these concerns to the pt and left a message for his daughter [name] so that she is aware of the plan to d/c today and my concerns. Ashika Apuzzo RN MPH, case manager, [phone]

## 2013-08-06 NOTE — Care Management Note (Signed)
CARE MANAGEMENT NOTE 08/06/2013  Patient:  Tony Boyd, Tony Boyd   Account Number:  [id number]  Date Initiated:  07/28/2013  Documentation initiated by:  Jasmine Pang  Subjective/Objective Assessment:   Pt transferred to [place] on 07/28/13, noted referral for [facility] and DME needs.     Action/Plan:   07/28/2013 CM to follow for progression and d/c planning. Will follow for progression and recommendations from PT/OT.  08/06/2013 Met with Pt who had [facility] services previouly and will use again.   Anticipated DC Date:  08/06/2013   Anticipated DC Plan:  [place]         Choice offered to / List presented to:          [facility] arranged  HH-1 RN  [place].   Status of service:  Completed, signed off Medicare Important Message given?  YES (If response is "NO", the following Medicare IM given date fields will be blank) Date Medicare IM given:  08/06/2013 Date Additional Medicare IM given:    Discharge Disposition:    Per UR Regulation:    If discussed at Long Length of Stay Meetings, dates discussed:    Comments:  08/06/2013 This CM expained IM letter in detail, however, pt refused to sign. Pt stating that he wishes to go home and is afraid the IM would prevent this. Thiscase manager was unable to reassue this pt, so IM was placed inb the record without a signature. Jasmine Pang RN MPH [phone] Pt states that he has two walkers and can get into his apartment. [facility] validated that this pt received HHRN and HHPT services and at that time had a rollator walker. CRoyal RN MPH addem : Multiple attempts to reach pt daughter, Tony Boyd, @ [phone], this number was validated with [facility] as their contact number when previously providing [facility] services. Messages left on this number by this CM and CSW , Crawford Givens.  Pt states that he has DME and that his door is unlocked so that he can return home. He also insist that he has food in his refrigerator/freezer. As Psy  has determined that this pt has capacity and can make his own medical decision, the pt will d/c to home. As the CM for this pt I remain concerned that this pt will not be able to perform ADLs in his home environment , I have expressed these concerns to the pt and left a message for his daughter Tony Boyd so that she is aware of the plan to d/c today and my concerns. Maicie Vanderloop RN MPH, case manager, [phone] 08/06/13 1420 This CM spoke with CM Director , Vena Austria  re concerns re this pt d/c to home without family support and inablity to get OOB without assistance. Furthermore the pt was unable to dial phone numbers while this CM was in the room and offering to assist. Pt is unable to provide family phone numbers or answer most questions appropriately. Pt is unrealistic about his abilities and requires 2+ assist to get OOB. Pt has been lying in the same position each time this CM has been in the room today and was unable to demonstrate the ability to turn himself over in bed.  Spoke with CSW , Crawford Givens re this CM concern for pt to care for himself, Ms Sharlet Salina had contacted the [facility] Dept yesterday 09/28/2334 and ask that they visit the home of the pt daughter to determine if  anyone was at home and request that they call the hospital at approx 1115 on 08/05/2013. As there has been not response from the police, she placed another request at 1520 on 08/06/13 and that time was informed that the address provided was visited and there was no one in the residence. This CM then contacted Dr Nira Conn to request that pt d/c be cancelled. CSW Ms Sharlet Salina again placed calls to the pt daughter, Tony Boyd and Tony Boyd. At this time 1540 , Tony Boyd answered her phone and Ms Sharlet Salina was able to speak with her, she stated that she would have Tony Boyd call later as she assist their father with health care issues.  Then spoke with Dr Fredrich Birks who agreed to cancelled the d/c order until a plan could be formulated with the  family now that they have responded. This CM informed the pt that he would not be d/c from the hospital today. Also informed the pt that we had made contact with his daughter and hope to formulate a plan with his daughters for his safe d/c.  The pt stated that he most likely would not agree and that he could care for himself. This CM stated to the pt that he would have to be able to get OOB without assistance and go to the bathroom. The pt then attempted to demonstrate that he could get OOB, however was unable to pull self to a sitting position or turn over. Await return call from pt daughter to begin d/c planning. Jasmine Pang RN MPH, case Freight forwarder. [phone]

## 2013-08-07 DIAGNOSIS — N390 Urinary tract infection, site not specified: Secondary | ICD-10-CM

## 2013-08-07 LAB — GLUCOSE, CAPILLARY
Glucose-Capillary: 103 mg/dL — ABNORMAL HIGH (ref 70–99)
Glucose-Capillary: 133 mg/dL — ABNORMAL HIGH (ref 70–99)
Glucose-Capillary: 143 mg/dL — ABNORMAL HIGH (ref 70–99)

## 2013-08-07 LAB — URINALYSIS, ROUTINE W REFLEX MICROSCOPIC
Glucose, UA: NEGATIVE mg/dL
Ketones, ur: 15 mg/dL — AB
NITRITE: POSITIVE — AB
PROTEIN: 30 mg/dL — AB
SPECIFIC GRAVITY, URINE: 1.017 (ref 1.005–1.030)
UROBILINOGEN UA: 1 mg/dL (ref 0.0–1.0)
pH: 8.5 — ABNORMAL HIGH (ref 5.0–8.0)

## 2013-08-07 LAB — BASIC METABOLIC PANEL
BUN: 6 mg/dL (ref 6–23)
CO2: 30 mEq/L (ref 19–32)
Calcium: 8.2 mg/dL — ABNORMAL LOW (ref 8.4–10.5)
Chloride: 98 mEq/L (ref 96–112)
Creatinine, Ser: 0.54 mg/dL (ref 0.50–1.35)
GFR calc non Af Amer: >90 mL/min (ref >90)
Glucose, Bld: 152 mg/dL — ABNORMAL HIGH (ref 70–99)
POTASSIUM: 4.7 meq/L (ref 3.7–5.3)
Sodium: 138 mEq/L (ref 137–147)

## 2013-08-07 LAB — CBC
HCT: 30.8 % — ABNORMAL LOW (ref 39.0–52.0)
HEMOGLOBIN: 9.8 g/dL — AB (ref 13.0–17.0)
MCH: 34.9 pg — ABNORMAL HIGH (ref 26.0–34.0)
MCHC: 31.8 g/dL (ref 30.0–36.0)
MCV: 109.6 fL — ABNORMAL HIGH (ref 78.0–100.0)
PLATELETS: 49 10*3/uL — AB (ref 150–400)
RBC: 2.81 MIL/uL — ABNORMAL LOW (ref 4.22–5.81)
RDW: 21.9 % — AB (ref 11.5–15.5)
WBC: 5.4 10*3/uL (ref 4.0–10.5)

## 2013-08-07 LAB — URINE MICROSCOPIC-ADD ON

## 2013-08-07 LAB — CLOSTRIDIUM DIFFICILE BY PCR: CDIFFPCR: NEGATIVE

## 2013-08-07 MED ORDER — OXYCODONE HCL 5 MG PO TABS
10.0000 mg | ORAL_TABLET | ORAL | Status: DC | PRN
  Administered 2013-08-07 (×2): 10 mg via ORAL
  Filled 2013-08-07 (×2): qty 2

## 2013-08-07 MED ORDER — ALPRAZOLAM 1 MG PO TABS: 1.0000 mg | ORAL_TABLET | Freq: Every evening | ORAL | Status: DC | PRN

## 2013-08-07 MED ORDER — OXYCODONE HCL 5 MG PO TABS: ORAL_TABLET | ORAL | Status: DC

## 2013-08-07 MED ORDER — CIPROFLOXACIN HCL 500 MG PO TABS: 500.0000 mg | ORAL_TABLET | Freq: Two times a day (BID) | ORAL | Status: AC

## 2013-08-07 NOTE — Progress Notes (Signed)
Occupational Therapy Treatment Patient Details Name: Tony Boyd MRN: [id number] DOB: 25-Feb-1956 Today's Date: 08/07/2013    History of present illness 58 yo former smoker with history of ETOH / Hepatitis liver disease fell at home resulting in L intertrochanteric fx and pulmonary contusion.  Had Nailing of hip on 5/29 associated with significant blood loss, and remained on vent post-op.   OT comments  Pt demonstrates cognitive deficits at this time and is unsafe to d/c home alone from OT standpoint. Pt is poor historian. Pt high fall risk and requires two person (A) at this time for transfers.   Follow Up Recommendations  SNF;Supervision/Assistance - 24 hour    Equipment Recommendations  3 in 1 bedside comode;Hospital bed;Wheelchair cushion (measurements OT);Wheelchair (measurements OT)    Recommendations for Other Services      Precautions / Restrictions Precautions Precautions: Fall Restrictions LLE Weight Bearing: Weight bearing as tolerated       Mobility Bed Mobility Overal bed mobility: Needs Assistance Bed Mobility: Supine to Sit     Supine to sit: Min guard;HOB elevated     General bed mobility comments: pt requires multiple attempts, Pt requires heavy use of bed rail. pt slowly progressing to EOB. Pt several attempts to advance trunk to upright position  Transfers Overall transfer level: Needs assistance Equipment used: Rolling walker (2 wheeled) Transfers: Sit to/from Stand Sit to Stand: Mod assist;+2 physical assistance Stand pivot transfers: Min assist;+2 physical assistance            Balance Overall balance assessment: Needs assistance Sitting-balance support: Bilateral upper extremity supported;Feet supported Sitting balance-Leahy Scale: Fair     Standing balance support: No upper extremity supported;During functional activity Standing balance-Leahy Scale: Poor                     ADL Overall ADL's : Needs assistance/impaired      Grooming: Wash/dry hands;Wash/dry face;Moderate assistance;Cueing for safety;Standing (posture lean with static standing) Grooming Details (indicate cue type and reason): Pt able to perform oral care sitting min (A)     Lower Body Bathing: Maximal assistance;Sit to/from stand Lower Body Bathing Details (indicate cue type and reason): pt requires (A) for reach and detail to peri care         Toilet Transfer: +2 for physical assistance;Minimal assistance;Stand-pivot;Ambulation;BSC;RW   Toileting- Clothing Manipulation and Hygiene: Maximal assistance;Sit to/from stand;Cueing for safety Toileting - Clothing Manipulation Details (indicate cue type and reason): requires (A) for balance     Functional mobility during ADLs: Moderate assistance;+2 for physical assistance;Rolling walker General ADL Comments: Pt very confused this session. pt requesting to void bladder but voiding bowels on BSC. Pt positioned in chair with chair alarm due to cognitive deficits. OT speaking with MD and SW concerning need for SNF placement. Pt progressing with bed mobiilty but unsafe to live alone at this time.      Vision                     Perception     Praxis      Cognition   Behavior During Therapy: Restless Overall Cognitive Status: Impaired/Different from baseline Area of Impairment: Orientation;Attention;Memory;Following commands;Safety/judgement;Awareness;Problem solving Orientation Level: Disoriented to;Place;Time;Situation Current Attention Level: Sustained Memory: Decreased recall of precautions;Decreased short-term memory  Following Commands: Follows one step commands with increased time;Follows one step commands inconsistently Safety/Judgement: Decreased awareness of safety;Decreased awareness of deficits Awareness: Intellectual Problem Solving: Slow processing;Decreased initiation;Difficulty sequencing General Comments: Pt requesting to use "bottle" on  arrival to void bladder. Pt  unaware of foley in place for voiding. pt reports having two daughters 64 , 3 and a baby on the way. Pt later asked if today was Friday because his daughter is in school. Pt reports his daughter lives next door and is 37 yos old and his 58 year old has 2 more years of school. Pt requesting to void on 3n1 but attempting to walk toward door of room. pt needed redirection to 3n1.    Extremity/Trunk Assessment               Exercises     Shoulder Instructions       General Comments      Pertinent Vitals/ Pain       Requesting medication at end of session        Rn in room and notified  Home Living                                          Prior Functioning/Environment              Frequency Min 2X/week     Progress Toward Goals  OT Goals(current goals can now be found in the care plan section)  Progress towards OT goals: Progressing toward goals  Acute Rehab OT Goals Patient Stated Goal: to go home OT Goal Formulation: Patient unable to participate in goal setting Time For Goal Achievement: 08/10/13 Potential to Achieve Goals: Good ADL Goals Pt Will Perform Upper Body Bathing: with supervision;with set-up;sitting Pt Will Transfer to Toilet: with mod assist;bedside commode Pt Will Perform Toileting - Clothing Manipulation and hygiene: with mod assist;sit to/from stand  Plan Discharge plan remains appropriate    Co-evaluation                 End of Session     Activity Tolerance Patient tolerated treatment well   Patient Left in chair;with call bell/phone within reach;with chair alarm set   Nurse Communication Mobility status;Precautions        Time: 6387-5643 OT Time Calculation (min): 25 min  Charges: OT General Charges $OT Visit: 1 Procedure OT Treatments $Self Care/Home Management : 23-37 mins  Parke Poisson B 08/07/2013, 10:09 AM Pager: [phone]

## 2013-08-07 NOTE — Progress Notes (Signed)
[facility] PROGRESS NOTE  Tony Boyd IRS:[id number] DOB: 1955-07-29 DOA: 07/23/2013  PCP: Hollace Kinnier, DO  Brief History: Tony Boyd is a 58 y.o. male with a past medical history of cirrhosis, anasarca, hypothyroidism, portal hypertension gastropathy. He presented to the hospital after mechanical fall involving his walker. He fell on his left side and subsequently developed left lower extremity discomfort. In the ED had x-ray which showed left intratrochanteric femur fracture. Also had chest x-ray reporting either left contusion versus pneumonia. Felt to be left pulmonary contusion given history of recent fall and as such antibiotics were not initiated. He received FFP's for INR of 2.2.He underwent surgery to fix the hip on 5/29. He had significant blood loss with surgery and was transferred to the ICU intubated. A central line was placed in Right IJ. He was transfused PRBC's. He required pressors for a brief period and required an arterial line as well. He remained confused. He pulled out his central line on 6/2 resulting in more bleeding. He also developed severe thrombocytopenia and had to be transfused platelets. He was seen by CIR but not felt a good candidate for inpatient rehab. A PICC line was placed to right basilic vein on 6/4 due to poor venous access. He was started on aggressive IV diuresis due to significant volume overload from multiple transfusions and underlying liver cirrhosis. Plan was for the patient to be discharged to SNF but he has been refusing. Due to his periods of confusion we obtained a psychiatric assessment during which he was found to lack capacity. Multiple attempts to reach his daughters were unsuccessful. Case was referred to the social worker who was considering referral to DSS.  Assessment/Plan: 1-left intertrochanteric fracture: s/p nailing by orthopedic surgery. -slowly progressing -will need SNF for rehabilitation but patient states he wants to go home.  Psychiatry now feels he does have capacity. I'm not so sure. The patient's mentation waxes and wanes. Sometimes he is very alert and oriented, but other times he is quite confused. Has started to scale back his narcotic medications and removed his Foley catheter. Checking urinalysis  2-ABLA: s/p transfusion (FFP and PRBC's) -Hgb continues to be stable, on 6/10 and it increased to 8.8 -will follow trend and transfuse further as needed (if Hgb less than 7)  3-Cirrhosis/ESLD -with anasarca and fluid overload -He is diuresing well. Lasix changed to by mouth. Continue spironolactone -follow VS and volume status He is down 27 L -continue rifaximin and lactulose. Ammonia was normal.  4-ETOH abuse: -cessation counseling provided -continue CIWA, thiamine and folic acid  5-GERD:continue PPI  6-thrombocytopenia: due to cirrhosis  -will continue SCD's and avoid heparin products -Recd one unit of platelets on 6/4 -Counts stable.  7-DM type 2: -continue SSI With patient eating better, CBG is trending up. Have increased sliding scale to resistant  8-hypothyroidism: -continue synthroid  9-Hx of seizure: no seizure activity appreciated -continue keppra  10-depression/anxiety: continue risperdal and paxil -Psyche has determined that patient does not have capacity to make medical decisions. Have been attempting to reach family without any luck. CSW to assist with further management of this issue.  -On 6/7 night he was given Ambien and experienced side effects so it was stopped. Mental status appears back to baseline now.  11-resp failure and hypotension: -resolved now -continue flutter valve/IS and PRN oxygen supplementation.  10-chronic pain: will continue current PO analgesic regimen with oxycodone 20mg  Q6HPRN  DVT Prophylaxis: SCD's only, due to severe thrombocytopenia Code Status: Full Family Communication: no family  at bedside, attempted to call daughter Disposition Plan: See above.  Patient needs skilled nursing, but is not able to   Consultants:  Orthopedic service  PCCM  Procedures:  Left hip intertrochanteric nailing   PICC line  Antibiotics:  None   HPI/Subjective: Patient complains of some mild soreness, otherwise no other complaints. Denies any shortness of breath  Objective: Filed Vitals:   08/07/13 0504  BP: 113/53  Pulse: 79  Temp: 97.7 F (36.5 C)  Resp: 16    Intake/Output Summary (Last 24 hours) at 08/07/13 1054 Last data filed at 08/07/13 0542  Gross per 24 hour  Intake    360 ml  Output   2000 ml  Net  -1640 ml   Filed Weights   08/02/13 2226 08/04/13 2100 08/06/13 2000  Weight: 124.785 kg (275 lb 1.6 oz) 120.294 kg (265 lb 3.2 oz) 113.082 kg (249 lb 4.8 oz)    Exam:   General:  Confused, in sustaining he's okay  Cardiovascular: Regular rate and rhythm, S1-S2  Respiratory: No clubbing or cyanosis or edema  Abdomen: Soft, nontender, nondistended, positive bowel sounds  Musculoskeletal: 2+ pitting edema bilaterally   Data Reviewed: Basic Metabolic Panel:  Recent Labs Lab 08/01/13 0500 08/02/13 0410 08/03/13 0450 08/05/13 0425  NA 138 139 138 139  K 3.6* 3.8 3.7 3.8  CL 96 97 97 99  CO2 36* 33* 35* 34*  GLUCOSE 121* 149* 113* 138*  BUN 8 9 8 7   CREATININE 0.59 0.63 0.55 0.53  CALCIUM 8.0* 7.9* 7.9* 8.5   Liver Function Tests:  Recent Labs Lab 08/01/13 1125 08/02/13 0410 08/05/13 0425  AST 30 35 38*  ALT 13 12 14   ALKPHOS 126* 133* 171*  BILITOT 7.5* 7.1* 5.9*  PROT 5.5* 5.6* 6.0  ALBUMIN 2.4* 2.4* 2.4*   CBC:  Recent Labs Lab 08/01/13 0500 08/02/13 0410 08/03/13 0450 08/05/13 0425  WBC 4.2 4.1 5.0 3.9*  HGB 7.8* 7.9* 8.1* 8.8*  HCT 24.6* 24.8* 25.1* 27.0*  MCV 107.4* 106.9* 106.8* 109.3*  PLT 49* 49* 49* 48*   BNP (last 3 results)  Recent Labs  05/31/13 2330 06/22/13 1738 06/30/13 2225  PROBNP 304.0* 296.3* 669.2*   CBG:  Recent Labs Lab 08/06/13 0741 08/06/13 1144  08/06/13 1655 08/06/13 1959 08/07/13 0810  GLUCAP 128* 135* 182* 172* 103*    Recent Results (from the past 240 hour(s))  CLOSTRIDIUM DIFFICILE BY PCR     Status: None   Collection Time    07/30/13  9:29 AM      Result Value Ref Range Status   C difficile by pcr NEGATIVE  NEGATIVE Final  CLOSTRIDIUM DIFFICILE BY PCR     Status: None   Collection Time    08/07/13  2:00 AM      Result Value Ref Range Status   C difficile by pcr NEGATIVE  NEGATIVE Final     Studies: No results found.  Scheduled Meds: . feeding supplement (ENSURE COMPLETE)  237 mL Oral BID BM  . folic acid  1 mg Oral Daily  . furosemide  60 mg Oral BID  . insulin aspart  0-20 Units Subcutaneous TID WC  . insulin aspart  0-5 Units Subcutaneous QHS  . iron polysaccharides  150 mg Oral BID  . lactulose  40 g Oral BID  . levETIRAcetam  1,000 mg Oral BID  . levothyroxine  75 mcg Oral QAC breakfast  . multivitamin with minerals  1 tablet Oral q morning - 10a  .  pantoprazole  40 mg Oral Daily  . PARoxetine  20 mg Oral Daily  . potassium chloride  40 mEq Oral Daily  . rifaximin  550 mg Oral BID  . sodium chloride  10-40 mL Intracatheter Q12H  . spironolactone  25 mg Oral BID  . thiamine  100 mg Oral Daily   Continuous Infusions:    Active Problems:   ETOH abuse   Hepatitis C   Cirrhosis   Seizure disorder   Hypothyroidism   Diabetes mellitus type 2, uncontrolled, with complications   Ascites   Chronic back pain   Fracture, intertrochanteric, left femur   Respiratory failure   Hypotension   Encephalopathy acute    Annita Brod  [facility] Pager [phone].   If 7PM-7AM, please contact night-coverage at [email or web address], password [email or web address]  08/07/2013, 10:54 AM  LOS: 15 days

## 2013-08-07 NOTE — Discharge Summary (Signed)
Physician Discharge Summary  Tony Boyd BJS:[id number] DOB: 1955/11/30 DOA: 07/23/2013  PCP: Hollace Kinnier, DO  Admit date: 07/23/2013 Discharge date: 08/07/2013  Time spent: 25 minutes  Recommendations for Outpatient Follow-up:  1. Patient to be [facility] 2. New medications: Cipro 500 mg by mouth twice a day 3. Medication change: Patient's Lantus been decreased to 5 units each bedtime 4. Medication adjustment: Patient being discharged on OxyIR 10 mg every 6 hours when necessary for pain. After 3 days as we decrease to 5 mg every 6 hours when necessary for pain 5. He'll follow up with orthopedic surgery in 2 weeks  Discharge Diagnoses:  Active Problems:   ETOH abuse   Hepatitis C   Cirrhosis   Seizure disorder   Hypothyroidism   Diabetes mellitus type 2, uncontrolled, with complications   Ascites   Chronic back pain   Fracture, intertrochanteric, left femur   Respiratory failure   Hypotension   Encephalopathy acute   Discharge Condition: Improved, being discharged home  Diet recommendation: Carb modified heart healthy  Filed Weights   08/02/13 2226 08/04/13 2100 08/06/13 2000  Weight: 124.785 kg (275 lb 1.6 oz) 120.294 kg (265 lb 3.2 oz) 113.082 kg (249 lb 4.8 oz)    History of present illness:  58 y.o. male with a past medical history of cirrhosis, anasarca, hypothyroidism, portal hypertension gastropathy. He presented to the hospital after mechanical fall involving his walker. He fell on his left side and subsequently developed left lower extremity discomfort. In the ED had x-ray which showed left intratrochanteric femur fracture. Also had chest x-ray reporting either left contusion versus pneumonia. Felt to be left pulmonary contusion given history of recent fall and as such antibiotics were not initiated. He received FFP's for INR of 2.2.  Hospital Course:  He underwent surgery to fix the hip on 5/29. He had significant blood  loss with surgery and was transferred to the ICU intubated. A central line was placed in Right IJ. He was transfused PRBC's. He required pressors for a brief period and required an arterial line as well. He remained confused. He pulled out his central line on 6/2 resulting in more bleeding. He also developed severe thrombocytopenia and had to be transfused platelets. He was seen by CIR but not felt a good candidate for inpatient rehab. A PICC line was placed to right basilic vein on 6/4 due to poor venous access. He was started on aggressive IV diuresis due to significant volume overload from multiple transfusions and underlying liver cirrhosis. Plan was for the patient to be discharged to SNF but he has been refusing. Due to his periods of confusion we obtained a psychiatric assessment during which he was found to lack capacity. Multiple attempts to reach his daughters were unsuccessful. Psychiatry followed up on 6/10 and felt that patient's mentation had improved in that he now had better capacity. At this point, he felt medically stable and since he still refuses to go to short-term skilled nursing.  Were able to discuss this with one of his daughters who came and talked to the patient on 6/12. She was able to convince him to accept going to a short-term skilled nursing facility to get stronger.   Active Problems:   ETOH abuse: Alcohol calcium was provided. Patient was monitored for withdrawals closely. He received also vitamin supplementation.  UTI: Given patient's waxing and waning confusion, other causes were investigated. Urinalysis on 6/12 nodes large UTI. Patient being discharged on Cipro  500 mg by mouth twice a day.    Hepatitis C   Cirrhosis patient on admission was found to have anasarca and fluid overload. He was continuously diuresed with Lasix as well as increased on his home dose of Aldactone during his hospitalization. By time of discharge, he was down 25 L. Plan will be done discharge  resume normal home dose of Lasix and Aldactone. He'll also continue rifaximin and lactulose. Ammonia levels during his hospitalization have been normal.    Seizure disorder: Stable. Continue on Keppra. No acute episodes during his hospitalization.    Hypothyroidism: Continue on Synthroid.    Diabetes mellitus type 2, controlled with complications: Patient was on sliding scale mostly during his hospitalization. At the very end of his hospitalization, his appetite started to improve and occasionally had some elevated blood sugars in the 170s-200s.  His A1c was noted to be around 5.6. Concerns for home hypoglycemia. He decreased his home dose of Lantus to 5 units each bedtime.  Depression/anxiety: Continue on Risperdal and Paxil    Chronic back pain: Continue home pain medications.    Fracture, intertrochanteric, left femur: As above. Patient will followup with orthopedic surgery as outpatient.    Respiratory failure: As above. Resolved.    Hypotension: As above. Resolved.    Encephalopathy acute: Waxes and and wanes. And likely part from chronic alcohol, but acutely possibly from urinary tract infection. In addition, patient's pain medication being scaled back  Pancytopenia: Secondary to cirrhosis. Patient was continued on SCDs and avoided on heparin products. He received 1 units of platelets on 6/4 when his platelet count was as low as 8. He had no active bleeding. Platelet count on discharge was 48  Acute blood loss anemia in the setting of chronic macrocytic anemia: patient received packed red blood cells following surgery. His hemoglobin has remained stable and currently at 8.8 on discharge. His underlying macrocytosis likely secondary to heavy alcohol abuse   Procedures:  Status post placement of PICC line-discontinued prior to discharge  Status post left hip intertrochanteric nailing  Consultations:  Critical care  Orthopedics  Discharge Exam: Filed Vitals:   08/07/13 1738   BP: 121/56  Pulse: 79  Temp: 98.8 F (37.1 C)  Resp: 16    General: Alert and oriented x2, no acute distress-is a little bit off on the date Cardiovascular: Regular rate and rhythm, S1-S2 Respiratory: Clear to auscultation bilaterally  Discharge Instructions You were cared for by a hospitalist during your hospital stay. If you have any questions about your discharge medications or the care you received while you were in the hospital after you are discharged, you can call the unit and asked to speak with the hospitalist on call if the hospitalist that took care of you is not available. Once you are discharged, your primary care physician will handle any further medical issues. Please note that NO REFILLS for any discharge medications will be authorized once you are discharged, as it is imperative that you return to your primary care physician (or establish a relationship with a primary care physician if you do not have one) for your aftercare needs so that they can reassess your need for medications and monitor your lab values.      Discharge Instructions   Diet - low sodium heart healthy    Complete by:  As directed      Increase activity slowly    Complete by:  As directed      Weight bearing as tolerated  Complete by:  As directed             Medication List    STOP taking these medications       insulin glargine 100 UNIT/ML injection  Commonly known as:  LANTUS  Replaced by:  Insulin Glargine 100 UNIT/ML Solostar Pen      TAKE these medications       albuterol 108 (90 BASE) MCG/ACT inhaler  Commonly known as:  PROVENTIL HFA;VENTOLIN HFA  Inhale 1-2 puffs into the lungs every 6 (six) hours as needed for wheezing or shortness of breath.     ALPRAZolam 1 MG tablet  Commonly known as:  XANAX  Take 1 tablet (1 mg total) by mouth at bedtime as needed for anxiety.     ciprofloxacin 500 MG tablet  Commonly known as:  CIPRO  Take 1 tablet (500 mg total) by mouth 2 (two)  times daily.     feeding supplement (ENSURE COMPLETE) Liqd  Take 237 mLs by mouth 2 (two) times daily between meals.     ferrous sulfate 325 (65 FE) MG tablet  Take 1 tablet (325 mg total) by mouth at bedtime.     folic acid 1 MG tablet  Commonly known as:  FOLVITE  Take 1 tablet (1 mg total) by mouth every morning.     furosemide 20 MG tablet  Commonly known as:  LASIX  Take 3 tablets (60 mg total) by mouth 2 (two) times daily.     Insulin Glargine 100 UNIT/ML Solostar Pen  Commonly known as:  LANTUS SOLOSTAR  Inject 5 Units into the skin daily at 10 pm.     lactulose 10 GM/15ML solution  Commonly known as:  CHRONULAC  Take 60 mLs (40 g total) by mouth 3 (three) times daily.     levETIRAcetam 500 MG tablet  Commonly known as:  KEPPRA  Take 2 tablets (1,000 mg total) by mouth 2 (two) times daily.     levothyroxine 75 MCG tablet  Commonly known as:  SYNTHROID, LEVOTHROID  Take 75 mcg by mouth daily before breakfast.     multivitamin with minerals Tabs tablet  Take 1 tablet by mouth every morning.     nadolol 40 MG tablet  Commonly known as:  CORGARD  Take 1 tablet (40 mg total) by mouth every morning.     omeprazole 40 MG capsule  Commonly known as:  PRILOSEC  Take 1 capsule (40 mg total) by mouth daily.     oxyCODONE 5 MG immediate release tablet  Commonly known as:  Oxy IR/ROXICODONE  10mg  (2 tabs) every 6 hours as need for pain.  After 3 days, decrease to 5mg  (1 tab) every 6 hrs as needed for pain     PARoxetine 20 MG tablet  Commonly known as:  PAXIL  Take 1 tablet (20 mg total) by mouth daily.     rifaximin 550 MG Tabs tablet  Commonly known as:  XIFAXAN  Take 1 tablet (550 mg total) by mouth 2 (two) times daily.     risperiDONE 0.25 MG tablet  Commonly known as:  RISPERDAL  Take 1 tablet (0.25 mg total) by mouth 2 (two) times daily.     spironolactone 100 MG tablet  Commonly known as:  ALDACTONE  Take 2 tablets (200 mg total) by mouth every morning.        Allergies  Allergen Reactions  . Ativan [Lorazepam] Other (See Comments)    Makes patient very weak and cant  mentate appropriately  . Tylenol [Acetaminophen] Other (See Comments)    Aggravates liver problems  . Droperidol Hives  . Penicillins Swelling  . Toradol [Ketorolac Tromethamine] Hives   Follow-up Information   Follow up with Marianna Payment, MD In 2 weeks.   Specialty:  Orthopedic Surgery   Contact information:   [place] [address] [phone]        The results of significant diagnostics from this hospitalization (including imaging, microbiology, ancillary and laboratory) are listed below for reference.    Significant Diagnostic Studies: Dg Chest 1 View  07/23/2013     IMPRESSION: Left mid lung airspace opacity, consistent pulmonary contusion or other pneumonia. Recommend chest radiographic followup in several weeks to confirm resolution.   Electronically Signed   By: Earle Gell M.D.   On: 07/23/2013 17:04   Dg Lumbar Spine 2-3 Views  07/23/2013     IMPRESSION: 1. The minimally displaced left-sided intertrochanteric femur fracture is not well demonstrated on the present examination. Please refer to dedicated pelvic and left femur radiographs performed earlier same day. 2. Interval development of a moderate (approximately 40%) compression deformity involving the L2 vertebral body - correlation for point tenderness at this location is recommended. 3. Unchanged mild (< 25%) compression deformity involving the superior endplate of the L4 vertebral body. 4. Unchanged mild multilevel lumbar spine DDD, worse at L3-L4.   Electronically Signed   By: Sandi Mariscal M.D.   On: 07/23/2013 17:17   Dg Pelvis 1-2 Views  07/23/2013   CLINICAL DATA:  Fall, pain.  EXAM: PELVIS - 1-2 VIEW  COMPARISON:  Left femur series performed today.  FINDINGS: There is a left femoral intertrochanteric fracture. Mild lateral displacement. No subluxation or dislocation.  Mild degenerative changes in the hips bilaterally.  IMPRESSION: Left femoral intertrochanteric fracture.   Electronically Signed   By: Rolm Baptise M.D.   On: 07/23/2013 17:06   Dg Femur Left  07/24/2013   CLINICAL DATA:  left femoral nail  EXAM: LEFT FEMUR - 2 VIEW  COMPARISON:  None.  FINDINGS: Images demonstrate placement of a dynamic compression screw an intra medullary rod within the left femur. Anatomic alignment. No breakage or loosening of the hardware.  IMPRESSION: ORIF left femur fracture.   Electronically Signed   By: Maryclare Bean M.D.   On: 07/24/2013 15:55   Dg Femur Left  07/23/2013   CLINICAL DATA:  Fall, pain.  EXAM: LEFT FEMUR - 2 VIEW  COMPARISON:  None.  FINDINGS: There is a left femoral intertrochanteric fracture. Mild displacement and impaction. No subluxation or dislocation of the femoral head.  IMPRESSION: Mildly displaced and impacted left femoral intertrochanteric fracture.   Electronically Signed   By: Rolm Baptise M.D.   On: 07/23/2013 17:05   Ct Head Wo Contrast  07/23/2013     IMPRESSION: No acute intracranial pathology.   Electronically Signed   By: Kathreen Devoid   On: 07/23/2013 17:01   Ir Fluoro Guide Cv Line Right  07/30/2013   CLINICAL DATA:  Poor venous access, request for peripherally inserted central catheter.  EXAM: Right arm dual lumen power injectable PICC LINE PLACEMENT WITH ULTRASOUND AND FLUOROSCOPIC GUIDANCE  FLUOROSCOPY TIME:  18 seconds  PROCEDURE: The patient was advised of the possible risks and complications and agreed to undergo the procedure. The patient was then brought to the angiographic suite for the procedure.  The right arm was prepped with chlorhexidine, draped in the usual sterile fashion using  maximum barrier technique (cap and mask, sterile gown, sterile gloves, large sterile sheet, hand hygiene and cutaneous antisepsis) and infiltrated locally with 1% Lidocaine.  Ultrasound demonstrated patency of the right basilic vein, and this was documented  with an image. Under real-time ultrasound guidance, this vein was accessed with a 21 gauge micropuncture needle and image documentation was performed. A 0.018 wire was introduced in to the vein. Over this, a 5 [place] dual lumen power-injectable PICC was advanced to the lower SVC/right atrial junction. Fluoroscopy during the procedure and fluoro spot radiograph confirms appropriate catheter position. The catheter was flushed and covered with a sterile dressing.  Catheter length:  41 cm  Complications: None.  IMPRESSION: Successful right arm Power injectable PICC line placement with ultrasound and fluoroscopic guidance. The catheter is ready for use.  Read By:  Tsosie Billing PA-C   Electronically Signed   By: Jacqulynn Cadet M.D.   On: 07/30/2013 14:27   Ir US Guide Vasc Access Right  07/30/2013   CLINICAL DATA:  Poor venous access, request for peripherally inserted central catheter.  EXAM: Right arm dual lumen power injectable PICC LINE PLACEMENT WITH ULTRASOUND AND FLUOROSCOPIC GUIDANCE  FLUOROSCOPY TIME:  18 seconds  PROCEDURE: The patient was advised of the possible risks and complications and agreed to undergo the procedure. The patient was then brought to the angiographic suite for the procedure.  The right arm was prepped with chlorhexidine, draped in the usual sterile fashion using maximum barrier technique (cap and mask, sterile gown, sterile gloves, large sterile sheet, hand hygiene and cutaneous antisepsis) and infiltrated locally with 1% Lidocaine.  Ultrasound demonstrated patency of the right basilic vein, and this was documented with an image. Under real-time ultrasound guidance, this vein was accessed with a 21 gauge micropuncture needle and image documentation was performed. A 0.018 wire was introduced in to the vein. Over this, a 5 [place] dual lumen power-injectable PICC was advanced to the lower SVC/right atrial junction. Fluoroscopy during the procedure and fluoro spot radiograph confirms  appropriate catheter position. The catheter was flushed and covered with a sterile dressing.  Catheter length:  41 cm  Complications: None.  IMPRESSION: Successful right arm Power injectable PICC line placement with ultrasound and fluoroscopic guidance. The catheter is ready for use.  Read By:  Tsosie Billing PA-C   Electronically Signed   By: Jacqulynn Cadet M.D.   On: 07/30/2013 14:27   Dg Chest Port 1 View  07/28/2013   CLINICAL DATA:  Respiratory failure.  EXAM: PORTABLE CHEST - 1 VIEW  COMPARISON:  Single view of the chest 07/26/2013 and 07/25/2013.  FINDINGS: Right IJ catheter remains in place, unchanged. There has been interval worsening in aeration in the right lower lung zone. Airspace disease in the left mid and lower lung zones appears unchanged. No pneumothorax identified. Heart size is normal.  IMPRESSION: Worsened airspace disease in the right lower lung zone with no change on the left identified. Findings are compatible multifocal pneumonia and/or pulmonary edema.   Electronically Signed   By: Inge Rise M.D.   On: 07/28/2013 07:22   Dg Chest Port 1 View  07/26/2013   CLINICAL DATA:  Followup pleural effusion  EXAM: PORTABLE CHEST - 1 VIEW  COMPARISON:  07/25/2013  FINDINGS: Right jugular line is again identified and stable. The cardiac shadow is within normal limits. The overall inspiratory effort is poor. Persistent left basilar infiltrate and effusion is noted.  IMPRESSION: Persistent left infiltrate and effusion.   Electronically Signed  By: Inez Catalina M.D.   On: 07/26/2013 08:17   Dg Chest Port 1 View  07/25/2013  IMPRESSION: 1. Interval extubation and removal of enteric tube. Otherwise, stable positioning of remaining support apparatus. No pneumothorax. 2. Grossly unchanged findings of pulmonary edema and suspected small left-sided effusion. 3. No change to slight worsening of bilateral mid and lower lung heterogeneous opacities, left greater than right, atelectasis versus  infiltrate / contusion.   Electronically Signed   By: Sandi Mariscal M.D.   On: 07/25/2013 11:29   Dg Chest Port 1 View  07/24/2013   IMPRESSION: Left basilar effusion with likely underlying atelectasis.  Tubes and lines as described. The endotracheal tube should be withdrawn 1-2 cm.  These results were called by telephone at the time of interpretation on 07/24/2013 at 3:51 PM to [facility], the pts nurse, who verbally acknowledged these results.   Electronically Signed   By: Inez Catalina M.D.   On: 07/24/2013 15:51   Dg Knee Complete 4 Views Left  07/23/2013     IMPRESSION: 1. No acute fracture, malalignment or joint effusion. 2. Mild patellofemoral osteoarthritis. 3. Diffuse soft tissue edema versus cellulitis.   Electronically Signed   By: Jacqulynn Cadet M.D.   On: 07/23/2013 17:05   Dg C-arm 1-60 Min  07/24/2013   CLINICAL DATA:  Surgery in progress  EXAM: DG C-ARM 61-120 MIN  COMPARISON:  None.  FINDINGS: Fluoroscopy was utilized by the Orthopedic service.  IMPRESSION: See above.   Electronically Signed   By: Maryclare Bean M.D.   On: 07/24/2013 15:54    Microbiology: Recent Results (from the past 240 hour(s))  CLOSTRIDIUM DIFFICILE BY PCR     Status: None   Collection Time    07/30/13  9:29 AM      Result Value Ref Range Status   C difficile by pcr NEGATIVE  NEGATIVE Final  CLOSTRIDIUM DIFFICILE BY PCR     Status: None   Collection Time    08/07/13  2:00 AM      Result Value Ref Range Status   C difficile by pcr NEGATIVE  NEGATIVE Final     Labs: Basic Metabolic Panel:  Recent Labs Lab 08/01/13 0500 08/02/13 0410 08/03/13 0450 08/05/13 0425 08/07/13 1025  NA 138 139 138 139 138  Boyd 3.6* 3.8 3.7 3.8 4.7  CL 96 97 97 99 98  CO2 36* 33* 35* 34* 30  GLUCOSE 121* 149* 113* 138* 152*  BUN 8 9 8 7 6   CREATININE 0.59 0.63 0.55 0.53 0.54  CALCIUM 8.0* 7.9* 7.9* 8.5 8.2*   Liver Function Tests:  Recent Labs Lab 08/01/13 1125 08/02/13 0410 08/05/13 0425  AST 30 35 38*  ALT 13 12  14   ALKPHOS 126* 133* 171*  BILITOT 7.5* 7.1* 5.9*  PROT 5.5* 5.6* 6.0  ALBUMIN 2.4* 2.4* 2.4*   No results found for this basename: LIPASE, AMYLASE,  in the last 168 hours  Recent Labs Lab 08/01/13 1125 08/05/13 1152  AMMONIA 38 40   CBC:  Recent Labs Lab 08/01/13 0500 08/02/13 0410 08/03/13 0450 08/05/13 0425 08/07/13 1025  WBC 4.2 4.1 5.0 3.9* 5.4  HGB 7.8* 7.9* 8.1* 8.8* 9.8*  HCT 24.6* 24.8* 25.1* 27.0* 30.8*  MCV 107.4* 106.9* 106.8* 109.3* 109.6*  PLT 49* 49* 49* 48* 49*   Cardiac Enzymes: No results found for this basename: CKTOTAL, CKMB, CKMBINDEX, TROPONINI,  in the last 168 hours BNP: BNP (last 3 results)  Recent Labs  05/31/13  2330 06/22/13 1738 06/30/13 2225  PROBNP 304.0* 296.3* 669.2*   CBG:  Recent Labs Lab 08/06/13 1655 08/06/13 1959 08/07/13 0810 08/07/13 1113 08/07/13 1650  GLUCAP 182* 172* 103* 133* 143*       Signed:  Vidyuth Belsito Boyd  [facility] 08/07/2013, 6:20 PM

## 2013-08-07 NOTE — Progress Notes (Signed)
Physical Therapy Treatment Patient Details Name: Tony Boyd MRN: [id number] DOB: 15-Apr-1955 Today's Date: 08/07/2013    History of Present Illness 58 yo former smoker with history of ETOH / Hepatitis liver disease fell at home resulting in L intertrochanteric fx and pulmonary contusion.  Had Nailing of hip on 5/29 associated with significant blood loss, and remained on vent post-op.    PT Comments    Patient making slow progress primarily due to decreased cognition.  Follow Up Recommendations  SNF;Supervision/Assistance - 24 hour     Equipment Recommendations  None recommended by PT    Recommendations for Other Services       Precautions / Restrictions Precautions Precautions: Fall Precaution Comments: pt edematous everywhere, bilat UEs/LEs and abdomen Restrictions Weight Bearing Restrictions: Yes LLE Weight Bearing: Weight bearing as tolerated    Mobility  Bed Mobility                  Transfers Overall transfer level: Needs assistance Equipment used: Rolling walker (2 wheeled) Transfers: Sit to/from Stand Sit to Stand: Min assist;+2 physical assistance (from recliner)         General transfer comment: Verbal cues for hand placement.  Assist for balance/safety and to rise to standing.  Ambulation/Gait Ambulation/Gait assistance: Min assist;+2 physical assistance Ambulation Distance (Feet): 6 Feet Assistive device: Rolling walker (2 wheeled) Gait Pattern/deviations: Step-to pattern;Decreased stance time - left;Decreased step length - right;Decreased stride length;Trunk flexed Gait velocity: Decreased Gait velocity interpretation: Below normal speed for age/gender General Gait Details: Patient required repeated verbal cues for safe ambulation with RW.  Cues to ambulate straight ahead with chair behind him.  Patient continued to attempt to turn around.  Declined to go further than foot of bed.   Repeated following sitting rest break.    Stairs             Wheelchair Mobility    Modified Rankin (Stroke Patients Only)       Balance                                    Cognition Arousal/Alertness: Awake/alert Behavior During Therapy: Restless Overall Cognitive Status: Impaired/Different from baseline Area of Impairment: Orientation;Attention;Memory;Following commands;Safety/judgement;Awareness;Problem solving Orientation Level: Disoriented to;Place;Time;Situation Current Attention Level: Sustained Memory: Decreased short-term memory Following Commands: Follows one step commands inconsistently;Follows one step commands with increased time Safety/Judgement: Decreased awareness of safety;Decreased awareness of deficits   Problem Solving: Slow processing;Decreased initiation;Difficulty sequencing;Requires verbal cues General Comments: Patient requiring redirection to task    Exercises      General Comments        Pertinent Vitals/Pain Pain 4/10 LLE    Home Living                      Prior Function            PT Goals (current goals can now be found in the care plan section) Progress towards PT goals: Progressing toward goals    Frequency  Min 4X/week    PT Plan Current plan remains appropriate    Co-evaluation             End of Session Equipment Utilized During Treatment: Gait belt Activity Tolerance: Patient limited by pain (Self-limited due to confusion) Patient left: in chair;with call bell/phone within reach;with chair alarm set (lift sling in chair)     Time: 4765-4650 PT Time Calculation (min):  24 min  Charges:  $Gait Training: 23-37 mins                    G Codes:      Despina Pole 02-Sep-2013, 2:54 PM Carita Pian. Sanjuana Kava, [place] Pager [phone]

## 2013-08-07 NOTE — Clinical Social Work Note (Addendum)
Patient's daughter Tony Boyd came to visit him and talk with CSW as scheduled (shortly after 4:30 pm). Per Tony Boyd, her sister Tony Boyd knew about the appointment but she has not heard from her. Daughter talked with MD and then requested to speak with her dad alone regarding SNF. Daughter later advised CSW that patient in agreement. CSW visited with patient and daughter, and although he remains confused, he did express agreement. Daughter given bed offers and selected [facility]. Call made to facility (5:08 pm) regarding patient and additional clinicals requested and reviewed. Patient accepted by [facility] and will d/c to facility this evening.  [name], MSW, LCSW [phone]

## 2013-08-07 NOTE — Progress Notes (Signed)
Tony Boyd discharged Skilled nursing facility per MD order.    IV site discontinued and catheter remains intact.  Patient escorted to ambulance by EMT in a stretcher,  no distress noted upon discharge.  [name] 08/07/2013 8:21 PM

## 2013-08-11 ENCOUNTER — Telehealth: Payer: Self-pay | Admitting: Gastroenterology

## 2013-08-11 MED ORDER — LACTULOSE 10 GM/15ML PO SOLN: 10.0000 g | Freq: Two times a day (BID) | ORAL | Status: AC | PRN

## 2013-08-11 NOTE — Telephone Encounter (Signed)
Medication sent to pharmacy  

## 2013-08-11 NOTE — Telephone Encounter (Signed)
Dr Deatra Ina, Please advise

## 2013-08-11 NOTE — Telephone Encounter (Signed)
Begin lactulose 15 cc twice a day

## 2013-09-09 ENCOUNTER — Ambulatory Visit: Payer: Self-pay | Admitting: Nurse Practitioner

## 2013-09-10 ENCOUNTER — Ambulatory Visit: Payer: Self-pay | Admitting: Nurse Practitioner

## 2013-09-11 ENCOUNTER — Emergency Department (HOSPITAL_COMMUNITY): Payer: Medicare Other

## 2013-09-11 ENCOUNTER — Emergency Department (HOSPITAL_COMMUNITY)
Admission: EM | Admit: 2013-09-11 | Discharge: 2013-09-12 | Disposition: A | Discharge status: Home / Self Care | Payer: Medicare Other | Attending: Emergency Medicine | Admitting: Emergency Medicine

## 2013-09-11 ENCOUNTER — Encounter (HOSPITAL_COMMUNITY): Payer: Self-pay | Admitting: Emergency Medicine

## 2013-09-11 DIAGNOSIS — E1142 Type 2 diabetes mellitus with diabetic polyneuropathy: Secondary | ICD-10-CM | POA: Diagnosis not present

## 2013-09-11 DIAGNOSIS — S0993XA Unspecified injury of face, initial encounter: Secondary | ICD-10-CM | POA: Diagnosis not present

## 2013-09-11 DIAGNOSIS — Y92009 Unspecified place in unspecified non-institutional (private) residence as the place of occurrence of the external cause: Secondary | ICD-10-CM | POA: Diagnosis not present

## 2013-09-11 DIAGNOSIS — E039 Hypothyroidism, unspecified: Secondary | ICD-10-CM | POA: Diagnosis not present

## 2013-09-11 DIAGNOSIS — R296 Repeated falls: Secondary | ICD-10-CM | POA: Diagnosis not present

## 2013-09-11 DIAGNOSIS — S298XXA Other specified injuries of thorax, initial encounter: Secondary | ICD-10-CM | POA: Diagnosis not present

## 2013-09-11 DIAGNOSIS — G40909 Epilepsy, unspecified, not intractable, without status epilepticus: Secondary | ICD-10-CM | POA: Insufficient documentation

## 2013-09-11 DIAGNOSIS — K219 Gastro-esophageal reflux disease without esophagitis: Secondary | ICD-10-CM | POA: Insufficient documentation

## 2013-09-11 DIAGNOSIS — S8990XA Unspecified injury of unspecified lower leg, initial encounter: Secondary | ICD-10-CM | POA: Diagnosis not present

## 2013-09-11 DIAGNOSIS — S99919A Unspecified injury of unspecified ankle, initial encounter: Secondary | ICD-10-CM | POA: Diagnosis not present

## 2013-09-11 DIAGNOSIS — N189 Chronic kidney disease, unspecified: Secondary | ICD-10-CM | POA: Insufficient documentation

## 2013-09-11 DIAGNOSIS — F319 Bipolar disorder, unspecified: Secondary | ICD-10-CM | POA: Insufficient documentation

## 2013-09-11 DIAGNOSIS — W19XXXA Unspecified fall, initial encounter: Secondary | ICD-10-CM

## 2013-09-11 DIAGNOSIS — Y939 Activity, unspecified: Secondary | ICD-10-CM | POA: Insufficient documentation

## 2013-09-11 DIAGNOSIS — Z8781 Personal history of (healed) traumatic fracture: Secondary | ICD-10-CM | POA: Insufficient documentation

## 2013-09-11 DIAGNOSIS — F1010 Alcohol abuse, uncomplicated: Secondary | ICD-10-CM | POA: Insufficient documentation

## 2013-09-11 DIAGNOSIS — E1149 Type 2 diabetes mellitus with other diabetic neurological complication: Secondary | ICD-10-CM | POA: Diagnosis not present

## 2013-09-11 DIAGNOSIS — Z87891 Personal history of nicotine dependence: Secondary | ICD-10-CM | POA: Diagnosis not present

## 2013-09-11 DIAGNOSIS — S46909A Unspecified injury of unspecified muscle, fascia and tendon at shoulder and upper arm level, unspecified arm, initial encounter: Secondary | ICD-10-CM | POA: Insufficient documentation

## 2013-09-11 DIAGNOSIS — Z79899 Other long term (current) drug therapy: Secondary | ICD-10-CM | POA: Insufficient documentation

## 2013-09-11 DIAGNOSIS — S199XXA Unspecified injury of neck, initial encounter: Secondary | ICD-10-CM | POA: Diagnosis present

## 2013-09-11 DIAGNOSIS — Z880 Allergy status to penicillin: Secondary | ICD-10-CM | POA: Diagnosis not present

## 2013-09-11 DIAGNOSIS — F411 Generalized anxiety disorder: Secondary | ICD-10-CM | POA: Insufficient documentation

## 2013-09-11 DIAGNOSIS — Z794 Long term (current) use of insulin: Secondary | ICD-10-CM | POA: Diagnosis not present

## 2013-09-11 DIAGNOSIS — S4980XA Other specified injuries of shoulder and upper arm, unspecified arm, initial encounter: Secondary | ICD-10-CM | POA: Diagnosis not present

## 2013-09-11 DIAGNOSIS — E669 Obesity, unspecified: Secondary | ICD-10-CM | POA: Diagnosis not present

## 2013-09-11 DIAGNOSIS — S99929A Unspecified injury of unspecified foot, initial encounter: Secondary | ICD-10-CM

## 2013-09-11 DIAGNOSIS — Z862 Personal history of diseases of the blood and blood-forming organs and certain disorders involving the immune mechanism: Secondary | ICD-10-CM | POA: Insufficient documentation

## 2013-09-11 DIAGNOSIS — Z8739 Personal history of other diseases of the musculoskeletal system and connective tissue: Secondary | ICD-10-CM | POA: Diagnosis not present

## 2013-09-11 DIAGNOSIS — Z872 Personal history of diseases of the skin and subcutaneous tissue: Secondary | ICD-10-CM | POA: Diagnosis not present

## 2013-09-11 DIAGNOSIS — Z8619 Personal history of other infectious and parasitic diseases: Secondary | ICD-10-CM | POA: Insufficient documentation

## 2013-09-11 LAB — CBC WITH DIFFERENTIAL/PLATELET
BASOS PCT: 0 % (ref 0–1)
Basophils Absolute: 0 10*3/uL (ref 0.0–0.1)
EOS PCT: 3 % (ref 0–5)
Eosinophils Absolute: 0.1 10*3/uL (ref 0.0–0.7)
HEMATOCRIT: 31.1 % — AB (ref 39.0–52.0)
Hemoglobin: 10.8 g/dL — ABNORMAL LOW (ref 13.0–17.0)
LYMPHS ABS: 0.6 10*3/uL — AB (ref 0.7–4.0)
Lymphocytes Relative: 17 % (ref 12–46)
MCH: 34.8 pg — AB (ref 26.0–34.0)
MCHC: 34.7 g/dL (ref 30.0–36.0)
MCV: 100.3 fL — AB (ref 78.0–100.0)
MONO ABS: 0.2 10*3/uL (ref 0.1–1.0)
MONOS PCT: 6 % (ref 3–12)
Neutro Abs: 2.8 10*3/uL (ref 1.7–7.7)
Neutrophils Relative %: 74 % (ref 43–77)
Platelets: 35 10*3/uL — ABNORMAL LOW (ref 150–400)
RBC: 3.1 MIL/uL — AB (ref 4.22–5.81)
RDW: 14.7 % (ref 11.5–15.5)
WBC: 3.7 10*3/uL — ABNORMAL LOW (ref 4.0–10.5)

## 2013-09-11 LAB — BASIC METABOLIC PANEL
ANION GAP: 8 (ref 5–15)
BUN: 7 mg/dL (ref 6–23)
CALCIUM: 8.2 mg/dL — AB (ref 8.4–10.5)
CHLORIDE: 99 meq/L (ref 96–112)
CO2: 28 meq/L (ref 19–32)
Creatinine, Ser: 0.46 mg/dL — ABNORMAL LOW (ref 0.50–1.35)
GFR calc Af Amer: >90 mL/min (ref >90)
GFR calc non Af Amer: >90 mL/min (ref >90)
GLUCOSE: 146 mg/dL — AB (ref 70–99)
Potassium: 4.1 mEq/L (ref 3.7–5.3)
Sodium: 135 mEq/L — ABNORMAL LOW (ref 137–147)

## 2013-09-11 LAB — URINALYSIS, ROUTINE W REFLEX MICROSCOPIC
Glucose, UA: NEGATIVE mg/dL
HGB URINE DIPSTICK: NEGATIVE
Ketones, ur: NEGATIVE mg/dL
Leukocytes, UA: NEGATIVE
NITRITE: NEGATIVE
PH: 6.5 (ref 5.0–8.0)
Protein, ur: NEGATIVE mg/dL
Specific Gravity, Urine: 1.015 (ref 1.005–1.030)
Urobilinogen, UA: 4 mg/dL — ABNORMAL HIGH (ref 0.0–1.0)

## 2013-09-11 LAB — PROTIME-INR
INR: 1.84 — AB (ref 0.00–1.49)
PROTHROMBIN TIME: 21.3 s — AB (ref 11.6–15.2)

## 2013-09-11 LAB — CK: Total CK: 138 U/L (ref 7–232)

## 2013-09-11 LAB — AMMONIA: Ammonia: 30 umol/L (ref 11–60)

## 2013-09-11 LAB — ETHANOL: Alcohol, Ethyl (B): 49 mg/dL — ABNORMAL HIGH (ref 0–11)

## 2013-09-11 MED ORDER — LEVETIRACETAM 500 MG PO TABS
1000.0000 mg | ORAL_TABLET | Freq: Once | ORAL | Status: AC
  Administered 2013-09-12: 1000 mg via ORAL
  Filled 2013-09-11: qty 2

## 2013-09-11 MED ORDER — SODIUM CHLORIDE 0.9 % IV BOLUS (SEPSIS): 1000.0000 mL | Freq: Once | INTRAVENOUS | Status: DC

## 2013-09-11 NOTE — ED Notes (Signed)
IV team unable to obtain IV access. Dr. Wilson Singer aware. Will attempt to start using US guided.

## 2013-09-11 NOTE — Discharge Instructions (Signed)
Call for a follow up appointment with a Family or Primary Care Provider.  Call for a follow up appointment with your Orthopedic doctor for further evaluation of your shoulder and hip pain. Avoid abusing alcohol, to avoid further falls. Return if Symptoms worsen.   Take medication as prescribed.

## 2013-09-11 NOTE — ED Notes (Signed)
Pt CBG taken by staff when he first arrived, result [id number]

## 2013-09-11 NOTE — ED Notes (Signed)
Report received from previous RN, Mickel Baas

## 2013-09-11 NOTE — ED Notes (Signed)
IV team attempting IV start

## 2013-09-11 NOTE — ED Notes (Signed)
Per EMS: Pt was lying on couch yesterday at 1500, fell off onto R side. Usually walks with walker. Didn't have the strength to get back up. Pt laid there until cable guy came today and called EMS. Pt had alcohol. Pt denies any injury from the fall, states feeling stiff and c/o pain to lower back and L hip that was there before fall. A&O x 4.

## 2013-09-11 NOTE — ED Notes (Signed)
C-collar in place

## 2013-09-11 NOTE — ED Notes (Signed)
Bed: WA01 Expected date:  Expected time:  Means of arrival:  Comments: EMS 

## 2013-09-11 NOTE — ED Notes (Signed)
IV Team is at bedside attempting to start IV and draw blood.

## 2013-09-11 NOTE — ED Notes (Signed)
Pt was able to walk from his room to the bathroom with a walker
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
## 2013-09-11 NOTE — ED Notes (Signed)
Unable to obtain IV access and blood work, IV team nurse to attempt.

## 2013-09-13 NOTE — ED Provider Notes (Signed)
Medical screening examination/treatment/procedure(s) were performed by non-physician practitioner and as supervising physician I was immediately available for consultation/collaboration.   EKG Interpretation None        Virgel Manifold, MD 09/13/13 2142

## 2013-09-14 ENCOUNTER — Other Ambulatory Visit: Payer: Self-pay | Admitting: *Deleted

## 2013-09-14 LAB — CBG MONITORING, ED: GLUCOSE-CAPILLARY: 178 mg/dL — AB (ref 70–99)

## 2013-09-14 MED ORDER — OXYCODONE HCL 5 MG PO TABS: ORAL_TABLET | ORAL | Status: DC

## 2013-09-14 MED ORDER — ALPRAZOLAM 1 MG PO TABS: 1.0000 mg | ORAL_TABLET | Freq: Every evening | ORAL | Status: DC | PRN

## 2013-09-14 NOTE — Telephone Encounter (Signed)
Per Dr.Reed, Note made on refill indicating patient needs to schedule a follow-up appointment. Patient did not show for last appointment

## 2013-09-14 NOTE — Telephone Encounter (Signed)
Patient Requested 

## 2013-10-08 ENCOUNTER — Ambulatory Visit: Payer: Medicare Other | Admitting: Nurse Practitioner

## 2013-10-15 ENCOUNTER — Other Ambulatory Visit: Payer: Self-pay | Admitting: *Deleted

## 2013-10-15 MED ORDER — OXYCODONE HCL 5 MG PO TABS: ORAL_TABLET | ORAL | Status: DC

## 2013-10-29 ENCOUNTER — Ambulatory Visit: Payer: Medicare Other | Admitting: Internal Medicine

## 2013-11-01 ENCOUNTER — Emergency Department (HOSPITAL_COMMUNITY): Payer: Medicare Other

## 2013-11-01 ENCOUNTER — Inpatient Hospital Stay (HOSPITAL_COMMUNITY)
Admission: EM | Admit: 2013-11-01 | Discharge: 2013-11-05 | DRG: 442 | Disposition: A | Discharge status: Skilled Nursing Facility | Payer: Medicare Other | Attending: Internal Medicine | Admitting: Internal Medicine

## 2013-11-01 DIAGNOSIS — F1010 Alcohol abuse, uncomplicated: Secondary | ICD-10-CM

## 2013-11-01 DIAGNOSIS — R609 Edema, unspecified: Secondary | ICD-10-CM

## 2013-11-01 DIAGNOSIS — D696 Thrombocytopenia, unspecified: Secondary | ICD-10-CM | POA: Diagnosis present

## 2013-11-01 DIAGNOSIS — K7290 Hepatic failure, unspecified without coma: Principal | ICD-10-CM

## 2013-11-01 DIAGNOSIS — K766 Portal hypertension: Secondary | ICD-10-CM

## 2013-11-01 DIAGNOSIS — G8929 Other chronic pain: Secondary | ICD-10-CM

## 2013-11-01 DIAGNOSIS — R55 Syncope and collapse: Secondary | ICD-10-CM

## 2013-11-01 DIAGNOSIS — E1165 Type 2 diabetes mellitus with hyperglycemia: Secondary | ICD-10-CM | POA: Diagnosis present

## 2013-11-01 DIAGNOSIS — F1020 Alcohol dependence, uncomplicated: Secondary | ICD-10-CM | POA: Diagnosis present

## 2013-11-01 DIAGNOSIS — K7682 Hepatic encephalopathy: Principal | ICD-10-CM

## 2013-11-01 DIAGNOSIS — E118 Type 2 diabetes mellitus with unspecified complications: Secondary | ICD-10-CM

## 2013-11-01 DIAGNOSIS — F411 Generalized anxiety disorder: Secondary | ICD-10-CM | POA: Diagnosis present

## 2013-11-01 DIAGNOSIS — IMO0001 Reserved for inherently not codable concepts without codable children: Secondary | ICD-10-CM | POA: Diagnosis present

## 2013-11-01 DIAGNOSIS — M129 Arthropathy, unspecified: Secondary | ICD-10-CM | POA: Diagnosis present

## 2013-11-01 DIAGNOSIS — R0602 Shortness of breath: Secondary | ICD-10-CM

## 2013-11-01 DIAGNOSIS — K7030 Alcoholic cirrhosis of liver without ascites: Secondary | ICD-10-CM | POA: Diagnosis present

## 2013-11-01 DIAGNOSIS — E876 Hypokalemia: Secondary | ICD-10-CM | POA: Diagnosis present

## 2013-11-01 DIAGNOSIS — G928 Other toxic encephalopathy: Secondary | ICD-10-CM

## 2013-11-01 DIAGNOSIS — Z87891 Personal history of nicotine dependence: Secondary | ICD-10-CM

## 2013-11-01 DIAGNOSIS — Z794 Long term (current) use of insulin: Secondary | ICD-10-CM | POA: Diagnosis not present

## 2013-11-01 DIAGNOSIS — R188 Other ascites: Secondary | ICD-10-CM

## 2013-11-01 DIAGNOSIS — W19XXXA Unspecified fall, initial encounter: Secondary | ICD-10-CM

## 2013-11-01 DIAGNOSIS — I8500 Esophageal varices without bleeding: Secondary | ICD-10-CM

## 2013-11-01 DIAGNOSIS — G40909 Epilepsy, unspecified, not intractable, without status epilepticus: Secondary | ICD-10-CM | POA: Diagnosis present

## 2013-11-01 DIAGNOSIS — D684 Acquired coagulation factor deficiency: Secondary | ICD-10-CM | POA: Diagnosis present

## 2013-11-01 DIAGNOSIS — E43 Unspecified severe protein-calorie malnutrition: Secondary | ICD-10-CM

## 2013-11-01 DIAGNOSIS — D638 Anemia in other chronic diseases classified elsewhere: Secondary | ICD-10-CM

## 2013-11-01 DIAGNOSIS — K7469 Other cirrhosis of liver: Secondary | ICD-10-CM

## 2013-11-01 DIAGNOSIS — K7460 Unspecified cirrhosis of liver: Secondary | ICD-10-CM | POA: Diagnosis present

## 2013-11-01 DIAGNOSIS — N390 Urinary tract infection, site not specified: Secondary | ICD-10-CM | POA: Diagnosis present

## 2013-11-01 DIAGNOSIS — I739 Peripheral vascular disease, unspecified: Secondary | ICD-10-CM | POA: Diagnosis present

## 2013-11-01 DIAGNOSIS — K219 Gastro-esophageal reflux disease without esophagitis: Secondary | ICD-10-CM | POA: Diagnosis present

## 2013-11-01 DIAGNOSIS — E039 Hypothyroidism, unspecified: Secondary | ICD-10-CM | POA: Diagnosis present

## 2013-11-01 DIAGNOSIS — D649 Anemia, unspecified: Secondary | ICD-10-CM

## 2013-11-01 DIAGNOSIS — R200 Anesthesia of skin: Secondary | ICD-10-CM

## 2013-11-01 DIAGNOSIS — R410 Disorientation, unspecified: Secondary | ICD-10-CM

## 2013-11-01 DIAGNOSIS — N5089 Other specified disorders of the male genital organs: Secondary | ICD-10-CM

## 2013-11-01 DIAGNOSIS — K3189 Other diseases of stomach and duodenum: Secondary | ICD-10-CM

## 2013-11-01 DIAGNOSIS — F319 Bipolar disorder, unspecified: Secondary | ICD-10-CM | POA: Diagnosis present

## 2013-11-01 DIAGNOSIS — IMO0002 Reserved for concepts with insufficient information to code with codable children: Secondary | ICD-10-CM

## 2013-11-01 DIAGNOSIS — M549 Dorsalgia, unspecified: Secondary | ICD-10-CM

## 2013-11-01 DIAGNOSIS — R404 Transient alteration of awareness: Secondary | ICD-10-CM

## 2013-11-01 DIAGNOSIS — R601 Generalized edema: Secondary | ICD-10-CM

## 2013-11-01 DIAGNOSIS — Z79899 Other long term (current) drug therapy: Secondary | ICD-10-CM | POA: Diagnosis not present

## 2013-11-01 DIAGNOSIS — F29 Unspecified psychosis not due to a substance or known physiological condition: Secondary | ICD-10-CM | POA: Diagnosis present

## 2013-11-01 DIAGNOSIS — R531 Weakness: Secondary | ICD-10-CM

## 2013-11-01 DIAGNOSIS — G9340 Encephalopathy, unspecified: Secondary | ICD-10-CM | POA: Diagnosis present

## 2013-11-01 DIAGNOSIS — G92 Toxic encephalopathy: Secondary | ICD-10-CM

## 2013-11-01 DIAGNOSIS — K7031 Alcoholic cirrhosis of liver with ascites: Secondary | ICD-10-CM

## 2013-11-01 DIAGNOSIS — E1142 Type 2 diabetes mellitus with diabetic polyneuropathy: Secondary | ICD-10-CM | POA: Diagnosis present

## 2013-11-01 DIAGNOSIS — E440 Moderate protein-calorie malnutrition: Secondary | ICD-10-CM

## 2013-11-01 LAB — LIPASE, BLOOD: Lipase: 22 U/L (ref 11–59)

## 2013-11-01 LAB — RAPID URINE DRUG SCREEN, HOSP PERFORMED
AMPHETAMINES: NOT DETECTED
BARBITURATES: NOT DETECTED
Benzodiazepines: NOT DETECTED
Cocaine: NOT DETECTED
Opiates: NOT DETECTED
Tetrahydrocannabinol: NOT DETECTED

## 2013-11-01 LAB — CBC
HCT: 34.6 % — ABNORMAL LOW (ref 39.0–52.0)
Hemoglobin: 11.6 g/dL — ABNORMAL LOW (ref 13.0–17.0)
MCH: 37.3 pg — AB (ref 26.0–34.0)
MCHC: 33.5 g/dL (ref 30.0–36.0)
MCV: 111.3 fL — ABNORMAL HIGH (ref 78.0–100.0)
Platelets: 48 10*3/uL — ABNORMAL LOW (ref 150–400)
RBC: 3.11 MIL/uL — ABNORMAL LOW (ref 4.22–5.81)
RDW: 17.5 % — ABNORMAL HIGH (ref 11.5–15.5)
WBC: 5.5 10*3/uL (ref 4.0–10.5)

## 2013-11-01 LAB — COMPREHENSIVE METABOLIC PANEL
ALT: 41 U/L (ref 0–53)
AST: 99 U/L — ABNORMAL HIGH (ref 0–37)
Albumin: 2 g/dL — ABNORMAL LOW (ref 3.5–5.2)
Alkaline Phosphatase: 358 U/L — ABNORMAL HIGH (ref 39–117)
Anion gap: 11 (ref 5–15)
BUN: 11 mg/dL (ref 6–23)
CALCIUM: 8 mg/dL — AB (ref 8.4–10.5)
CO2: 27 mEq/L (ref 19–32)
Chloride: 100 mEq/L (ref 96–112)
Creatinine, Ser: 0.58 mg/dL (ref 0.50–1.35)
Glucose, Bld: 100 mg/dL — ABNORMAL HIGH (ref 70–99)
Potassium: 3.2 mEq/L — ABNORMAL LOW (ref 3.7–5.3)
SODIUM: 138 meq/L (ref 137–147)
TOTAL PROTEIN: 7 g/dL (ref 6.0–8.3)
Total Bilirubin: 10 mg/dL — ABNORMAL HIGH (ref 0.3–1.2)

## 2013-11-01 LAB — URINALYSIS, ROUTINE W REFLEX MICROSCOPIC
GLUCOSE, UA: NEGATIVE mg/dL
HGB URINE DIPSTICK: NEGATIVE
KETONES UR: 15 mg/dL — AB
Nitrite: POSITIVE — AB
PROTEIN: NEGATIVE mg/dL
Specific Gravity, Urine: 1.02 (ref 1.005–1.030)
UROBILINOGEN UA: 2 mg/dL — AB (ref 0.0–1.0)
pH: 6 (ref 5.0–8.0)

## 2013-11-01 LAB — URINE MICROSCOPIC-ADD ON

## 2013-11-01 LAB — GLUCOSE, CAPILLARY: GLUCOSE-CAPILLARY: 104 mg/dL — AB (ref 70–99)

## 2013-11-01 LAB — PROTIME-INR
INR: 2.95 — ABNORMAL HIGH (ref 0.00–1.49)
PROTHROMBIN TIME: 30.7 s — AB (ref 11.6–15.2)

## 2013-11-01 LAB — AMMONIA: AMMONIA: 10 umol/L — AB (ref 11–60)

## 2013-11-01 LAB — ETHANOL

## 2013-11-01 MED ORDER — THIAMINE HCL 100 MG/ML IJ SOLN
100.0000 mg | Freq: Every day | INTRAMUSCULAR | Status: DC
  Filled 2013-11-01 (×3): qty 1

## 2013-11-01 MED ORDER — SPIRONOLACTONE 100 MG PO TABS
200.0000 mg | ORAL_TABLET | Freq: Every morning | ORAL | Status: DC
  Administered 2013-11-02 – 2013-11-05 (×4): 200 mg via ORAL
  Filled 2013-11-01 (×4): qty 2

## 2013-11-01 MED ORDER — CIPROFLOXACIN IN D5W 400 MG/200ML IV SOLN
400.0000 mg | Freq: Once | INTRAVENOUS | Status: AC
  Administered 2013-11-01: 400 mg via INTRAVENOUS
  Filled 2013-11-01: qty 200

## 2013-11-01 MED ORDER — ALPRAZOLAM 0.5 MG PO TABS
1.0000 mg | ORAL_TABLET | Freq: Every evening | ORAL | Status: DC | PRN
  Administered 2013-11-02: 1 mg via ORAL
  Filled 2013-11-01: qty 2

## 2013-11-01 MED ORDER — LEVOTHYROXINE SODIUM 75 MCG PO TABS
75.0000 ug | ORAL_TABLET | Freq: Every day | ORAL | Status: DC
Start: 2013-11-02 — End: 2013-11-05
  Administered 2013-11-02 – 2013-11-05 (×4): 75 ug via ORAL
  Filled 2013-11-01 (×6): qty 1

## 2013-11-01 MED ORDER — INSULIN GLARGINE 100 UNIT/ML ~~LOC~~ SOLN
5.0000 [IU] | Freq: Every day | SUBCUTANEOUS | Status: DC
  Administered 2013-11-01 – 2013-11-04 (×4): 5 [IU] via SUBCUTANEOUS
  Filled 2013-11-01 (×5): qty 0.05

## 2013-11-01 MED ORDER — DEXTROSE 5 % IV SOLN: 1.0000 g | Freq: Once | INTRAVENOUS | Status: DC

## 2013-11-01 MED ORDER — ENSURE COMPLETE PO LIQD
237.0000 mL | Freq: Every day | ORAL | Status: DC
  Administered 2013-11-02 – 2013-11-04 (×3): 237 mL via ORAL

## 2013-11-01 MED ORDER — LORAZEPAM 1 MG PO TABS: 1.0000 mg | ORAL_TABLET | Freq: Four times a day (QID) | ORAL | Status: AC | PRN

## 2013-11-01 MED ORDER — ENSURE PO LIQD: 237.0000 mL | Freq: Every day | ORAL | Status: DC

## 2013-11-01 MED ORDER — PAROXETINE HCL 20 MG PO TABS
20.0000 mg | ORAL_TABLET | Freq: Every day | ORAL | Status: DC
  Administered 2013-11-02 – 2013-11-05 (×4): 20 mg via ORAL
  Filled 2013-11-01 (×4): qty 1

## 2013-11-01 MED ORDER — RISPERIDONE 0.25 MG PO TABS
0.2500 mg | ORAL_TABLET | Freq: Two times a day (BID) | ORAL | Status: DC
  Administered 2013-11-01 – 2013-11-05 (×8): 0.25 mg via ORAL
  Filled 2013-11-01 (×9): qty 1

## 2013-11-01 MED ORDER — PANTOPRAZOLE SODIUM 40 MG PO TBEC
80.0000 mg | DELAYED_RELEASE_TABLET | Freq: Every day | ORAL | Status: DC
  Administered 2013-11-01 – 2013-11-05 (×5): 80 mg via ORAL
  Filled 2013-11-01 (×6): qty 2

## 2013-11-01 MED ORDER — LORAZEPAM 2 MG/ML IJ SOLN
1.0000 mg | Freq: Four times a day (QID) | INTRAMUSCULAR | Status: AC | PRN
Start: 2013-11-01 — End: 2013-11-04
  Administered 2013-11-02: 1 mg via INTRAVENOUS
  Filled 2013-11-01: qty 1

## 2013-11-01 MED ORDER — FOLIC ACID 1 MG PO TABS
1.0000 mg | ORAL_TABLET | Freq: Every morning | ORAL | Status: DC
  Administered 2013-11-02 – 2013-11-05 (×4): 1 mg via ORAL
  Filled 2013-11-01 (×4): qty 1

## 2013-11-01 MED ORDER — ADULT MULTIVITAMIN W/MINERALS CH
1.0000 | ORAL_TABLET | Freq: Every morning | ORAL | Status: DC
  Filled 2013-11-01: qty 1

## 2013-11-01 MED ORDER — INSULIN GLARGINE 100 UNIT/ML SOLOSTAR PEN: 5.0000 [IU] | PEN_INJECTOR | Freq: Every day | SUBCUTANEOUS | Status: DC

## 2013-11-01 MED ORDER — LEVETIRACETAM 500 MG PO TABS
1000.0000 mg | ORAL_TABLET | Freq: Two times a day (BID) | ORAL | Status: DC
  Administered 2013-11-01 – 2013-11-05 (×8): 1000 mg via ORAL
  Filled 2013-11-01 (×9): qty 2

## 2013-11-01 MED ORDER — FUROSEMIDE 40 MG PO TABS
60.0000 mg | ORAL_TABLET | Freq: Two times a day (BID) | ORAL | Status: DC
  Administered 2013-11-02 – 2013-11-05 (×7): 60 mg via ORAL
  Filled 2013-11-01 (×10): qty 1

## 2013-11-01 MED ORDER — VITAMIN B-1 100 MG PO TABS
100.0000 mg | ORAL_TABLET | Freq: Every day | ORAL | Status: DC
  Administered 2013-11-01 – 2013-11-05 (×5): 100 mg via ORAL
  Filled 2013-11-01 (×5): qty 1

## 2013-11-01 MED ORDER — SODIUM CHLORIDE 0.9 % IV SOLN
INTRAVENOUS | Status: DC
  Administered 2013-11-01 (×2): via INTRAVENOUS

## 2013-11-01 MED ORDER — LACTULOSE 10 GM/15ML PO SOLN
40.0000 g | Freq: Three times a day (TID) | ORAL | Status: DC
  Administered 2013-11-01 – 2013-11-04 (×8): 40 g via ORAL
  Filled 2013-11-01 (×10): qty 60

## 2013-11-01 MED ORDER — SODIUM CHLORIDE 0.9 % IV BOLUS (SEPSIS)
250.0000 mL | Freq: Once | INTRAVENOUS | Status: AC
Start: 2013-11-01 — End: 2013-11-01
  Administered 2013-11-01: 250 mL via INTRAVENOUS

## 2013-11-01 MED ORDER — FERROUS SULFATE 325 (65 FE) MG PO TABS
325.0000 mg | ORAL_TABLET | Freq: Every day | ORAL | Status: DC
  Administered 2013-11-01 – 2013-11-04 (×4): 325 mg via ORAL
  Filled 2013-11-01 (×5): qty 1

## 2013-11-01 MED ORDER — RIFAXIMIN 550 MG PO TABS
550.0000 mg | ORAL_TABLET | Freq: Two times a day (BID) | ORAL | Status: DC
  Administered 2013-11-01 – 2013-11-05 (×8): 550 mg via ORAL
  Filled 2013-11-01 (×9): qty 1

## 2013-11-01 MED ORDER — NADOLOL 40 MG PO TABS
40.0000 mg | ORAL_TABLET | Freq: Every morning | ORAL | Status: DC
  Administered 2013-11-02 – 2013-11-05 (×4): 40 mg via ORAL
  Filled 2013-11-01 (×4): qty 1

## 2013-11-01 MED ORDER — OXYCODONE HCL 5 MG PO TABS
5.0000 mg | ORAL_TABLET | Freq: Four times a day (QID) | ORAL | Status: DC | PRN
  Administered 2013-11-03 – 2013-11-05 (×7): 5 mg via ORAL
  Filled 2013-11-01 (×8): qty 1

## 2013-11-01 NOTE — ED Notes (Signed)
Still unable to get ammonia level drawn, communicated this to RN on 5W and Dr. Fabio Neighbors.

## 2013-11-01 NOTE — ED Notes (Signed)
IV team called for IV start and blood draw

## 2013-11-01 NOTE — ED Notes (Signed)
IV team at bedside 

## 2013-11-01 NOTE — Significant Event (Addendum)
Patient ammonia = 10 which is surprisingly low for this patient given his history.  Has also become agitated with hallucinations, suspicious for EtOH withdrawal.  Does have ongoing active EtOH use at home.  Will put on benzos per withdrawal protocol for elevated CIWA.

## 2013-11-01 NOTE — ED Notes (Signed)
Pt. Pulling at leads and taking off BP cuff and pulse ox.
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
## 2013-11-01 NOTE — ED Notes (Signed)
Pt now talking to other people he sees in the room. States there are 5 people in the room but "I don't know what they are saying".
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
## 2013-11-01 NOTE — ED Notes (Signed)
Pt. States he was on his roof and fell. Unknown LOC, patient is alert and oriented to self and place. Answers questions appropriately initially and then will continue to talk and does not make sense. Pt. Speech is slurred. Grips equal, no drift noted. Pupils equal and reactive at this time. Pt. Reports ETOH of 3 beers today. Multiple bruises of different stages throughout. Pt. Reports falling multiple times. Pt. Reports pain to left knee, old bruise noted.

## 2013-11-01 NOTE — Progress Notes (Signed)
Report taken for pt to be admitted on bed no 8.

## 2013-11-01 NOTE — Progress Notes (Addendum)
Pt arrived on unit. Alert and confused. Able to make needs known. IV on Rt upper arm  clean, dry and intact. Skin warm and dry as assessed. Pt care guide provided. Oriented to room and staff.

## 2013-11-01 NOTE — ED Notes (Signed)
Per Dr. Fabio Neighbors, okay to start cipro before blood cultures done.

## 2013-11-01 NOTE — Progress Notes (Signed)
Checked ammonia  level is 10, paged to  Annette Stable, MD and asked if he would  like to give sch lactulose or not. Pt  gets agitated and having hallucination and notified MD  that there was no ativan order with CIWA linked. Will cont to monitor.

## 2013-11-02 ENCOUNTER — Ambulatory Visit (HOSPITAL_COMMUNITY): Payer: Self-pay

## 2013-11-02 DIAGNOSIS — K7460 Unspecified cirrhosis of liver: Secondary | ICD-10-CM

## 2013-11-02 DIAGNOSIS — W19XXXA Unspecified fall, initial encounter: Secondary | ICD-10-CM

## 2013-11-02 DIAGNOSIS — G40909 Epilepsy, unspecified, not intractable, without status epilepticus: Secondary | ICD-10-CM

## 2013-11-02 DIAGNOSIS — G9340 Encephalopathy, unspecified: Secondary | ICD-10-CM

## 2013-11-02 LAB — GLUCOSE, CAPILLARY
GLUCOSE-CAPILLARY: 79 mg/dL (ref 70–99)
Glucose-Capillary: 115 mg/dL — ABNORMAL HIGH (ref 70–99)
Glucose-Capillary: 160 mg/dL — ABNORMAL HIGH (ref 70–99)

## 2013-11-02 LAB — TSH: TSH: 3.82 u[IU]/mL (ref 0.350–4.500)

## 2013-11-02 MED ORDER — DEXTROSE 5 % IV SOLN
1.0000 g | INTRAVENOUS | Status: DC
  Administered 2013-11-02 – 2013-11-05 (×4): 1 g via INTRAVENOUS
  Filled 2013-11-02 (×4): qty 10

## 2013-11-02 MED ORDER — POTASSIUM CHLORIDE CRYS ER 20 MEQ PO TBCR
40.0000 meq | EXTENDED_RELEASE_TABLET | Freq: Three times a day (TID) | ORAL | Status: AC
  Administered 2013-11-02 – 2013-11-03 (×6): 40 meq via ORAL
  Filled 2013-11-02 (×7): qty 2

## 2013-11-02 NOTE — Progress Notes (Signed)
Chart reviewed. Patient known to me from previous.   [facility] PROGRESS NOTE  Tony Boyd WUJ:[id number] DOB: [birth year] DOA: 11/01/2013 PCP: Hollace Kinnier, DO  Assessment/Plan:  Principal Problem:   Encephalopathy acute: ammonia level normal. Could be EtOH withdrawal v seizures. Continue CIWA. Check EEG v UTI. Continue keppra. Add ceftriaxone. Remains confused Active Problems:   Hepatic encephalopathy   ETOH abuse   Cirrhosis. H/o anasarca. D/c IVF. Continue diuretics.   Seizure disorder   Diabetes mellitus type 2, uncontrolled, with complications   Fall   UTI (urinary tract infection): rocephin  chronic thrombocytopenia/coagulopathy due to cirrhosis  in the past, has been in NH. PT eval when less confused.    Code Status:  full Family Communication:   Disposition Plan:  ?  Consultants:    Procedures:     Antibiotics:    HPI/Subjective: unable  Objective: Filed Vitals:   11/02/13 0559  BP: 108/51  Pulse: 80  Temp: 98 F (36.7 C)  Resp: 16    Intake/Output Summary (Last 24 hours) at 11/02/13 1035 Last data filed at 11/02/13 0600  Gross per 24 hour  Intake   1100 ml  Output      0 ml  Net   1100 ml   Filed Weights   11/01/13 2153  Weight: 101.3 kg (223 lb 5.2 oz)    Exam:   General:  Asleep. Arousable. Confused. Tremulous when awake  Cardiovascular: RRR  Respiratory: CTA without WRR  Abdomen: protuberant. BS present, nontender  Ext: brawny edema  Basic Metabolic Panel:  Recent Labs Lab 11/01/13 1805  NA 138  K 3.2*  CL 100  CO2 27  GLUCOSE 100*  BUN 11  CREATININE 0.58  CALCIUM 8.0*   Liver Function Tests:  Recent Labs Lab 11/01/13 1805  AST 99*  ALT 41  ALKPHOS 358*  BILITOT 10.0*  PROT 7.0  ALBUMIN 2.0*    Recent Labs Lab 11/01/13 1805  LIPASE 22    Recent Labs Lab 11/01/13 2200  AMMONIA 10*   CBC:  Recent Labs Lab 11/01/13 1805  WBC 5.5  HGB 11.6*  HCT 34.6*  MCV 111.3*  PLT 48*    Cardiac Enzymes: No results found for this basename: CKTOTAL, CKMB, CKMBINDEX, TROPONINI,  in the last 168 hours BNP (last 3 results)  Recent Labs  05/31/13 2330 06/22/13 1738 06/30/13 2225  PROBNP 304.0* 296.3* 669.2*   CBG:  Recent Labs Lab 11/01/13 2303  GLUCAP 104*    No results found for this or any previous visit (from the past 240 hour(s)).   Studies: Dg Chest 1 View  11/01/2013   CLINICAL DATA:  Fall  EXAM: CHEST - 1 VIEW  COMPARISON:  09/11/2013  FINDINGS: Lungs are essentially clear. No focal consolidation. No pleural effusion or pneumothorax.  The heart is normal in size.  IMPRESSION: No evidence of acute cardiopulmonary disease.   Electronically Signed   By: Julian Hy M.D.   On: 11/01/2013 19:14   Dg Hip Bilateral W/pelvis  11/01/2013   CLINICAL DATA:  Bilateral hip pain, fall  EXAM: BILATERAL HIP WITH PELVIS - 4+ VIEW  COMPARISON:  None.  FINDINGS: Status post ORIF of the left hip. No evidence of complication. Associated callus formation without superimposed acute fracture.  Right hip appears intact. Mild degenerative changes of the bilateral hips.  Visualized bony pelvis appears intact.  Degenerative changes of the lower lumbar spine.  IMPRESSION: Status post ORIF of the left hip.  No evidence of acute  fracture or dislocation.   Electronically Signed   By: Julian Hy M.D.   On: 11/01/2013 19:16   Ct Head Wo Contrast  11/01/2013   CLINICAL DATA:  Fall from roof, slurred speech  EXAM: CT HEAD WITHOUT CONTRAST  CT CERVICAL SPINE WITHOUT CONTRAST  TECHNIQUE: Multidetector CT imaging of the head and cervical spine was performed following the standard protocol without intravenous contrast. Multiplanar CT image reconstructions of the cervical spine were also generated.  COMPARISON:  09/11/2013  FINDINGS: CT HEAD FINDINGS  No evidence of parenchymal hemorrhage or extra-axial fluid collection. No mass lesion, mass effect, or midline shift.  No CT evidence of acute  infarction.  Subcortical white matter and periventricular small vessel ischemic changes.  Cerebral volume is within normal limits.  No ventriculomegaly.  Complete opacification of the left sphenoid sinus. Partial opacification of the left mastoid air cells.  No evidence of calvarial fracture.  CT CERVICAL SPINE FINDINGS  Normal cervical lordosis.  No evidence of fracture or dislocation. Vertebral body heights are maintained. Dens appears intact.  Mild multilevel degenerative changes.  Visualized thyroid is grossly unremarkable.  Visualized lung apices are essentially clear.  IMPRESSION: No evidence of acute intracranial abnormality. Mild small vessel ischemic changes.  No evidence of traumatic injury to the cervical spine. Mild multilevel degenerative changes.   Electronically Signed   By: Julian Hy M.D.   On: 11/01/2013 17:55   Ct Cervical Spine Wo Contrast  11/01/2013   CLINICAL DATA:  Fall from roof, slurred speech  EXAM: CT HEAD WITHOUT CONTRAST  CT CERVICAL SPINE WITHOUT CONTRAST  TECHNIQUE: Multidetector CT imaging of the head and cervical spine was performed following the standard protocol without intravenous contrast. Multiplanar CT image reconstructions of the cervical spine were also generated.  COMPARISON:  09/11/2013  FINDINGS: CT HEAD FINDINGS  No evidence of parenchymal hemorrhage or extra-axial fluid collection. No mass lesion, mass effect, or midline shift.  No CT evidence of acute infarction.  Subcortical white matter and periventricular small vessel ischemic changes.  Cerebral volume is within normal limits.  No ventriculomegaly.  Complete opacification of the left sphenoid sinus. Partial opacification of the left mastoid air cells.  No evidence of calvarial fracture.  CT CERVICAL SPINE FINDINGS  Normal cervical lordosis.  No evidence of fracture or dislocation. Vertebral body heights are maintained. Dens appears intact.  Mild multilevel degenerative changes.  Visualized thyroid is  grossly unremarkable.  Visualized lung apices are essentially clear.  IMPRESSION: No evidence of acute intracranial abnormality. Mild small vessel ischemic changes.  No evidence of traumatic injury to the cervical spine. Mild multilevel degenerative changes.   Electronically Signed   By: Julian Hy M.D.   On: 11/01/2013 17:55    Scheduled Meds: . feeding supplement (ENSURE COMPLETE)  237 mL Oral Daily  . ferrous sulfate  325 mg Oral QHS  . folic acid  1 mg Oral q morning - 10a  . furosemide  60 mg Oral BID  . insulin glargine  5 Units Subcutaneous QHS  . lactulose  40 g Oral TID  . levETIRAcetam  1,000 mg Oral BID  . levothyroxine  75 mcg Oral QAC breakfast  . multivitamin with minerals  1 tablet Oral q morning - 10a  . nadolol  40 mg Oral q morning - 10a  . pantoprazole  80 mg Oral Daily  . PARoxetine  20 mg Oral Daily  . rifaximin  550 mg Oral BID  . risperiDONE  0.25 mg Oral BID  .  spironolactone  200 mg Oral q morning - 10a  . thiamine  100 mg Oral Daily   Or  . thiamine  100 mg Intravenous Daily   Continuous Infusions: . sodium chloride 100 mL/hr at 11/02/13 0600    Time spent: 35 minutes  [facility] Pager [phone]. If 7PM-7AM, please contact night-coverage at [email or web address], password [facility] 11/02/2013, 10:35 AM  LOS: 1 day

## 2013-11-02 NOTE — Progress Notes (Addendum)
Per MD it's okay to continue Lactulose.

## 2013-11-03 ENCOUNTER — Inpatient Hospital Stay (HOSPITAL_COMMUNITY): Payer: Medicare Other

## 2013-11-03 DIAGNOSIS — R55 Syncope and collapse: Secondary | ICD-10-CM

## 2013-11-03 DIAGNOSIS — N390 Urinary tract infection, site not specified: Secondary | ICD-10-CM

## 2013-11-03 LAB — BASIC METABOLIC PANEL
Anion gap: 10 (ref 5–15)
BUN: 9 mg/dL (ref 6–23)
CALCIUM: 7.6 mg/dL — AB (ref 8.4–10.5)
CO2: 27 mEq/L (ref 19–32)
CREATININE: 0.63 mg/dL (ref 0.50–1.35)
Chloride: 104 mEq/L (ref 96–112)
GFR calc Af Amer: >90 mL/min (ref >90)
GFR calc non Af Amer: >90 mL/min (ref >90)
Glucose, Bld: 123 mg/dL — ABNORMAL HIGH (ref 70–99)
Potassium: 3.6 mEq/L — ABNORMAL LOW (ref 3.7–5.3)
Sodium: 141 mEq/L (ref 137–147)

## 2013-11-03 LAB — GLUCOSE, CAPILLARY
GLUCOSE-CAPILLARY: 111 mg/dL — AB (ref 70–99)
GLUCOSE-CAPILLARY: 167 mg/dL — AB (ref 70–99)
Glucose-Capillary: 233 mg/dL — ABNORMAL HIGH (ref 70–99)
Glucose-Capillary: 235 mg/dL — ABNORMAL HIGH (ref 70–99)

## 2013-11-03 NOTE — Procedures (Signed)
History: 58 year old male with altered mental status  Sedation: None  Technique: This is a 17 channel routine scalp EEG performed at the bedside with bipolar and monopolar montages arranged in accordance to the international 10/20 system of electrode placement. One channel was dedicated to EKG recording.    Background: During periods of maximal wakefulness, the patient has mild regular delta activity superimposed upon more normal also and beta activities. There is a well defined posterior dominant rhythm of 8 Hz that attenuates with eye opening. There is an increase in his delta associated with drowsiness and sleep is recorded.  Photic stimulation: Physiologic driving is not performed  EEG Abnormalities: 1) mild generalized irregular delta activity  Clinical Interpretation: This EEG is consistent with a mild nonspecific generalized cerebral dysfunction (encephalopathy). There was no seizure or seizure predisposition recorded on this study.   Roland Rack, MD [facility] [phone]  If 7pm- 7am, please page neurology on call as listed in [place].

## 2013-11-03 NOTE — Evaluation (Signed)
Physical Therapy Evaluation Patient Details Name: Tony Boyd MRN: [id number] DOB: [birth year] Today's Date: 11/03/2013   History of Present Illness  Tony Boyd is a 58 y.o. male brought in from home today after a fall at home. Circumstances surrounding fall are not clear. He was found on his porch he is uninjured other than a hematoma at this time. He was also noted to be confused while in the ED.  His confusion is suspected to be related to hepatic encephalopathy as the patient has known ESLD, poor compliance with home hepatic encephalopathy meds, and multiple admits in the past for this  Clinical Impression  Pt very calm and polite but confused regarding place, situation, and unaware of incontinence. Pt refers to many things as 2 weeks ago such as how long he's been soiled, when he had a TKA (he did not), and how long his mouth has been dry. Pt required total assist for pericare and linen change with cues for transfers, gait and function. Pt will benefit from acute therapy to maximize mobility, function, balance, safety and gait prior to D/C to decrease burden of care.     Follow Up Recommendations SNF;Supervision/Assistance - 24 hour    Equipment Recommendations  None recommended by PT    Recommendations for Other Services       Precautions / Restrictions Precautions Precautions: Fall Precaution Comments: incontinent of urine and stool      Mobility  Bed Mobility Overal bed mobility: Needs Assistance Bed Mobility: Supine to Sit     Supine to sit: Min guard     General bed mobility comments: cues for hand placement on rail with guarding and cues to fully elevate trunk  Transfers Overall transfer level: Needs assistance   Transfers: Sit to/from Stand Sit to Stand: Min assist         General transfer comment: cues for hand placement, safety and function   Ambulation/Gait Ambulation/Gait assistance: Min guard Ambulation Distance (Feet): 30 Feet Assistive device:  Rolling walker (2 wheeled) Gait Pattern/deviations: Wide base of support;Shuffle;Trunk flexed   Gait velocity interpretation: Below normal speed for age/gender General Gait Details: cues for posture, position in RW and safety  Stairs            Wheelchair Mobility    Modified Rankin (Stroke Patients Only)       Balance Overall balance assessment: Needs assistance   Sitting balance-Leahy Scale: Fair       Standing balance-Leahy Scale: Poor                               Pertinent Vitals/Pain Pain Assessment: No/denies pain    Home Living Family/patient expects to be discharged to:: Skilled nursing facility                 Additional Comments: pt was living alone, using RW PTA, has had a history of falls, no 24hr care. Dgtr sets out his meds daily    Prior Function Level of Independence: Independent with assistive device(s)               Hand Dominance        Extremity/Trunk Assessment   Upper Extremity Assessment: Overall WFL for tasks assessed           Lower Extremity Assessment: Generalized weakness      Cervical / Trunk Assessment: Normal  Communication   Communication: No difficulties  Cognition Arousal/Alertness: Awake/alert Behavior During Therapy: Flat  affect Overall Cognitive Status: Impaired/Different from baseline Area of Impairment: Orientation;Attention;Memory;Safety/judgement;Awareness;Problem solving Orientation Level: Disoriented to;Time;Situation;Place Current Attention Level: Focused Memory: Decreased short-term memory   Safety/Judgement: Decreased awareness of deficits;Decreased awareness of safety Awareness: Intellectual Problem Solving: Slow processing;Decreased initiation;Difficulty sequencing;Requires verbal cues General Comments: Upon entry to room pt covered from hips to heels in stool. Pt unaware and had not notified RN    General Comments      Exercises        Assessment/Plan    PT  Assessment Patient needs continued PT services  PT Diagnosis Difficulty walking;Generalized weakness;Altered mental status   PT Problem List Decreased strength;Decreased cognition;Decreased activity tolerance;Decreased safety awareness;Decreased skin integrity;Decreased knowledge of use of DME;Decreased balance;Decreased mobility  PT Treatment Interventions DME instruction;Gait training;Functional mobility training;Therapeutic activities;Therapeutic exercise;Patient/family education;Balance training   PT Goals (Current goals can be found in the Care Plan section) Acute Rehab PT Goals Patient Stated Goal: go to rehab "I liked [facility]" PT Goal Formulation: With patient Time For Goal Achievement: 11/17/13 Potential to Achieve Goals: Good    Frequency Min 3X/week   Barriers to discharge Decreased caregiver support      Co-evaluation               End of Session Equipment Utilized During Treatment: Gait belt Activity Tolerance: Patient tolerated treatment well Patient left: in chair;with call bell/phone within reach;with chair alarm set;with nursing/sitter in room Nurse Communication: Mobility status         Time: 1249-1315 PT Time Calculation (min): 26 min   Charges:   PT Evaluation $Initial PT Evaluation Tier I: 1 Procedure PT Treatments $Therapeutic Activity: 8-22 mins   PT G CodesMelford Aase 11/03/2013, 1:25 PM Elwyn Reach, [place]

## 2013-11-03 NOTE — Progress Notes (Signed)
EEG Completed; Results Pending  

## 2013-11-03 NOTE — Progress Notes (Addendum)
[facility] PROGRESS NOTE  Tony Boyd [id number]:[id number] DOB: Dec 12, 1955 DOA: 11/01/2013 PCP: Hollace Kinnier, DO  Assessment/Plan:  Principal Problem:   Encephalopathy acute: better today, but still slightly confused. ammonia level normal. Suspect alcohol withdrawal, but could be seizure/postictal or toxic encephalopathy from UTI.  Patient admits he is still drinking. Continue keppra ceftriaxone, CIWA. Await urine culture. Increase activity. Pt eval  Active Problems:   Hepatic encephalopathy   ETOH abuse: still drinking   Cirrhosis. H/o anasarca. 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  Consultants:    Procedures:     Antibiotics:  Ceftriaxone 9/7 -  HPI/Subjective: No complaints. Drinks "about 3 beers" per week. Does not remember me coming by yesterday  Objective: Filed Vitals:   11/03/13 0535  BP: 103/56  Pulse: 71  Temp: 98.4 F (36.9 C)  Resp: 16    Intake/Output Summary (Last 24 hours) at 11/03/13 0903 Last data filed at 11/03/13 0536  Gross per 24 hour  Intake    360 ml  Output    950 ml  Net   -590 ml   Filed Weights   11/01/13 2153  Weight: 101.3 kg (223 lb 5.2 oz)    Exam:   General:  Asleep. Arousable. Less distracted, and better able to answer questions, follow commands, but confabulating. Oriented to month and year, not date or circumstances of admission  Cardiovascular: RRR without MG  Respiratory: CTA without WRR  Abdomen: protuberant. BS present, nontender  Ext: brawny edema  Neuro: nonfocal. Slight tremmor v asterixis  Basic Metabolic Panel:  Recent Labs Lab 11/01/13 1805  NA 138  K 3.2*  CL 100  CO2 27  GLUCOSE 100*  BUN 11  CREATININE 0.58   CALCIUM 8.0*   Liver Function Tests:  Recent Labs Lab 11/01/13 1805  AST 99*  ALT 41  ALKPHOS 358*  BILITOT 10.0*  PROT 7.0  ALBUMIN 2.0*    Recent Labs Lab 11/01/13 1805  LIPASE 22    Recent Labs Lab 11/01/13 2200  AMMONIA 10*   CBC:  Recent Labs Lab 11/01/13 1805  WBC 5.5  HGB 11.6*  HCT 34.6*  MCV 111.3*  PLT 48*   Cardiac Enzymes: No results found for this basename: CKTOTAL, CKMB, CKMBINDEX, TROPONINI,  in the last 168 hours BNP (last 3 results)  Recent Labs  05/31/13 2330 06/22/13 1738 06/30/13 2225  PROBNP 304.0* 296.3* 669.2*   CBG:  Recent Labs Lab 11/01/13 2303 11/02/13 1220 11/02/13 1701 11/02/13 2144 11/03/13 0809  GLUCAP 104* 79 115* 160* 111*    No results found for this or any previous visit (from the past 240 hour(s)).   Studies: Dg Chest 1 View  11/01/2013   CLINICAL DATA:  Fall  EXAM: CHEST - 1 VIEW  COMPARISON:  09/11/2013  FINDINGS: Lungs are essentially clear. No focal consolidation. No pleural effusion or pneumothorax.  The heart is normal in size.  IMPRESSION: No evidence of acute cardiopulmonary disease.   Electronically Signed   By: Julian Hy M.D.   On: 11/01/2013 19:14   Dg Hip Bilateral W/pelvis  11/01/2013   CLINICAL DATA:  Bilateral hip pain, fall  EXAM: BILATERAL HIP WITH PELVIS - 4+ VIEW  COMPARISON:  None.  FINDINGS: Status post ORIF  of the left hip. No evidence of complication. Associated callus formation without superimposed acute fracture.  Right hip appears intact. Mild degenerative changes of the bilateral hips.  Visualized bony pelvis appears intact.  Degenerative changes of the lower lumbar spine.  IMPRESSION: Status post ORIF of the left hip.  No evidence of acute fracture or dislocation.   Electronically Signed   By: Julian Hy M.D.   On: 11/01/2013 19:16   Ct Head Wo Contrast  11/01/2013   CLINICAL DATA:  Fall from roof, slurred speech  EXAM: CT HEAD WITHOUT CONTRAST  CT CERVICAL SPINE WITHOUT  CONTRAST  TECHNIQUE: Multidetector CT imaging of the head and cervical spine was performed following the standard protocol without intravenous contrast. Multiplanar CT image reconstructions of the cervical spine were also generated.  COMPARISON:  09/11/2013  FINDINGS: CT HEAD FINDINGS  No evidence of parenchymal hemorrhage or extra-axial fluid collection. No mass lesion, mass effect, or midline shift.  No CT evidence of acute infarction.  Subcortical white matter and periventricular small vessel ischemic changes.  Cerebral volume is within normal limits.  No ventriculomegaly.  Complete opacification of the left sphenoid sinus. Partial opacification of the left mastoid air cells.  No evidence of calvarial fracture.  CT CERVICAL SPINE FINDINGS  Normal cervical lordosis.  No evidence of fracture or dislocation. Vertebral body heights are maintained. Dens appears intact.  Mild multilevel degenerative changes.  Visualized thyroid is grossly unremarkable.  Visualized lung apices are essentially clear.  IMPRESSION: No evidence of acute intracranial abnormality. Mild small vessel ischemic changes.  No evidence of traumatic injury to the cervical spine. Mild multilevel degenerative changes.   Electronically Signed   By: Julian Hy M.D.   On: 11/01/2013 17:55   Ct Cervical Spine Wo Contrast  11/01/2013   CLINICAL DATA:  Fall from roof, slurred speech  EXAM: CT HEAD WITHOUT CONTRAST  CT CERVICAL SPINE WITHOUT CONTRAST  TECHNIQUE: Multidetector CT imaging of the head and cervical spine was performed following the standard protocol without intravenous contrast. Multiplanar CT image reconstructions of the cervical spine were also generated.  COMPARISON:  09/11/2013  FINDINGS: CT HEAD FINDINGS  No evidence of parenchymal hemorrhage or extra-axial fluid collection. No mass lesion, mass effect, or midline shift.  No CT evidence of acute infarction.  Subcortical white matter and periventricular small vessel ischemic changes.   Cerebral volume is within normal limits.  No ventriculomegaly.  Complete opacification of the left sphenoid sinus. Partial opacification of the left mastoid air cells.  No evidence of calvarial fracture.  CT CERVICAL SPINE FINDINGS  Normal cervical lordosis.  No evidence of fracture or dislocation. Vertebral body heights are maintained. Dens appears intact.  Mild multilevel degenerative changes.  Visualized thyroid is grossly unremarkable.  Visualized lung apices are essentially clear.  IMPRESSION: No evidence of acute intracranial abnormality. Mild small vessel ischemic changes.  No evidence of traumatic injury to the cervical spine. Mild multilevel degenerative changes.   Electronically Signed   By: Julian Hy M.D.   On: 11/01/2013 17:55    Scheduled Meds: . cefTRIAXone (ROCEPHIN)  IV  1 g Intravenous Q24H  . feeding supplement (ENSURE COMPLETE)  237 mL Oral Daily  . ferrous sulfate  325 mg Oral QHS  . folic acid  1 mg Oral q morning - 10a  . furosemide  60 mg Oral BID  . insulin glargine  5 Units Subcutaneous QHS  . lactulose  40 g Oral TID  . levETIRAcetam  1,000 mg Oral  BID  . levothyroxine  75 mcg Oral QAC breakfast  . nadolol  40 mg Oral q morning - 10a  . pantoprazole  80 mg Oral Daily  . PARoxetine  20 mg Oral Daily  . potassium chloride  40 mEq Oral TID  . rifaximin  550 mg Oral BID  . risperiDONE  0.25 mg Oral BID  . spironolactone  200 mg Oral q morning - 10a  . thiamine  100 mg Oral Daily   Or  . thiamine  100 mg Intravenous Daily   Continuous Infusions:    Time spent: 35 minutes  [facility] [facility] Pager [phone]. If 7PM-7AM, please contact night-coverage at [email or web address], password [facility] 11/03/2013, 9:03 AM  LOS: 2 days

## 2013-11-04 LAB — COMPREHENSIVE METABOLIC PANEL
ALT: 35 U/L (ref 0–53)
ANION GAP: 7 (ref 5–15)
AST: 73 U/L — ABNORMAL HIGH (ref 0–37)
Albumin: 1.6 g/dL — ABNORMAL LOW (ref 3.5–5.2)
Alkaline Phosphatase: 297 U/L — ABNORMAL HIGH (ref 39–117)
BUN: 7 mg/dL (ref 6–23)
CHLORIDE: 101 meq/L (ref 96–112)
CO2: 30 mEq/L (ref 19–32)
CREATININE: 0.67 mg/dL (ref 0.50–1.35)
Calcium: 7.4 mg/dL — ABNORMAL LOW (ref 8.4–10.5)
GFR calc Af Amer: >90 mL/min (ref >90)
GFR calc non Af Amer: >90 mL/min (ref >90)
Glucose, Bld: 160 mg/dL — ABNORMAL HIGH (ref 70–99)
Potassium: 3.4 mEq/L — ABNORMAL LOW (ref 3.7–5.3)
Sodium: 138 mEq/L (ref 137–147)
Total Bilirubin: 4.6 mg/dL — ABNORMAL HIGH (ref 0.3–1.2)
Total Protein: 6 g/dL (ref 6.0–8.3)

## 2013-11-04 LAB — GLUCOSE, CAPILLARY
GLUCOSE-CAPILLARY: 171 mg/dL — AB (ref 70–99)
GLUCOSE-CAPILLARY: 184 mg/dL — AB (ref 70–99)
GLUCOSE-CAPILLARY: 251 mg/dL — AB (ref 70–99)
Glucose-Capillary: 199 mg/dL — ABNORMAL HIGH (ref 70–99)

## 2013-11-04 LAB — CBC
HEMATOCRIT: 30 % — AB (ref 39.0–52.0)
Hemoglobin: 9.8 g/dL — ABNORMAL LOW (ref 13.0–17.0)
MCH: 37.1 pg — ABNORMAL HIGH (ref 26.0–34.0)
MCHC: 32.7 g/dL (ref 30.0–36.0)
MCV: 113.6 fL — ABNORMAL HIGH (ref 78.0–100.0)
Platelets: 44 10*3/uL — ABNORMAL LOW (ref 150–400)
RBC: 2.64 MIL/uL — ABNORMAL LOW (ref 4.22–5.81)
RDW: 17.8 % — AB (ref 11.5–15.5)
WBC: 2.4 10*3/uL — AB (ref 4.0–10.5)

## 2013-11-04 LAB — URINE CULTURE
COLONY COUNT: NO GROWTH
Culture: NO GROWTH

## 2013-11-04 MED ORDER — SODIUM CHLORIDE 0.9 % IV BOLUS (SEPSIS)
500.0000 mL | Freq: Once | INTRAVENOUS | Status: AC
  Administered 2013-11-04: 500 mL via INTRAVENOUS

## 2013-11-04 MED ORDER — OXYCODONE HCL 5 MG PO TABS: ORAL_TABLET | ORAL | Status: AC

## 2013-11-04 MED ORDER — POTASSIUM CHLORIDE CRYS ER 20 MEQ PO TBCR
40.0000 meq | EXTENDED_RELEASE_TABLET | ORAL | Status: AC
  Administered 2013-11-04 (×3): 40 meq via ORAL
  Filled 2013-11-04 (×3): qty 2

## 2013-11-04 MED ORDER — CEFUROXIME AXETIL 500 MG PO TABS: 500.0000 mg | ORAL_TABLET | Freq: Two times a day (BID) | ORAL | Status: AC

## 2013-11-04 MED ORDER — ENSURE COMPLETE PO LIQD
237.0000 mL | Freq: Two times a day (BID) | ORAL | Status: DC
  Administered 2013-11-04 – 2013-11-05 (×2): 237 mL via ORAL

## 2013-11-04 MED ORDER — ALPRAZOLAM 1 MG PO TABS: 1.0000 mg | ORAL_TABLET | Freq: Three times a day (TID) | ORAL | Status: DC | PRN

## 2013-11-04 MED ORDER — LACTULOSE 10 GM/15ML PO SOLN
40.0000 g | Freq: Two times a day (BID) | ORAL | Status: DC
  Administered 2013-11-05: 40 g via ORAL
  Filled 2013-11-04 (×3): qty 60

## 2013-11-04 MED ORDER — THIAMINE HCL 100 MG PO TABS: 100.0000 mg | ORAL_TABLET | Freq: Every day | ORAL | Status: AC

## 2013-11-04 MED ORDER — ALPRAZOLAM 1 MG PO TABS: 1.0000 mg | ORAL_TABLET | Freq: Three times a day (TID) | ORAL | Status: AC | PRN
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
## 2013-11-04 NOTE — Clinical Social Work Psychosocial (Signed)
Clinical Social Work Department BRIEF PSYCHOSOCIAL ASSESSMENT 11/04/2013  Patient:  Tony Boyd, Tony Boyd     Account Number:  [id number]     Admit date:  11/01/2013  Clinical Social Worker:  Lovey Newcomer  Date/Time:  11/03/2013 11:56 AM  Referred by:  Physician  Date Referred:  11/04/2013 Referred for  SNF Placement   Other Referral:   Interview type:  Patient Other interview type:   Patient alert and oriented at time of assessment.    PSYCHOSOCIAL DATA Living Status:  ALONE Admitted from facility:   Level of care:   Primary support name:  [name] Primary support relationship to patient:  CHILD, ADULT Degree of support available:   Support is fair    CURRENT CONCERNS Current Concerns  Post-Acute Placement   Other Concerns:   NA    SOCIAL WORK ASSESSMENT / PLAN CSW met with patient at bedside to complete assessment. Patient states that he is agreeable to SNF placement and would like to go to [facility] SNF if possible. CSW explained SNF search process and answered patient's questions. Patient appeared calm and optimistic about SNF placement. CSW has attempted to reach daughter, but has not had success.   Assessment/plan status:  Psychosocial Support/Ongoing Assessment of Needs Other assessment/ plan:   Complete FL2, fax, PASRR   Information/referral to community resources:   CSW contact information and SNF list given.    PATIENT'S/FAMILY'S RESPONSE TO PLAN OF CARE: Patient states that he is agreeable to SNF DC when ready. CSW will assist.       Liz Beach MSW, [place], [place], [phone]

## 2013-11-04 NOTE — Clinical Social Work Placement (Signed)
Clinical Social Work Department CLINICAL SOCIAL WORK PLACEMENT NOTE 11/04/2013  Patient:  Tony Boyd, Tony Boyd  Account Number:  [id number] Admit date:  11/01/2013  Clinical Social Worker:  Kemper Durie, [place]  Date/time:  11/04/2013 12:14 PM  Clinical Social Work is seeking post-discharge placement for this patient at the following level of care:   SKILLED NURSING   (*CSW will update this form in Epic as items are completed)   11/04/2013  Patient/family provided with [facility] Department of Clinical Social Work's list of facilities offering this level of care within the geographic area requested by the patient (or if unable, by the patient's family).  11/04/2013  Patient/family informed of their freedom to choose among providers that offer the needed level of care, that participate in Medicare, Medicaid or managed care program needed by the patient, have an available bed and are willing to accept the patient.  11/04/2013  Patient/family informed of [facility]' ownership interest in [facility], as well as of the fact that they are under no obligation to receive care at this facility.  PASARR submitted to EDS on 11/04/2013 PASARR number received on 11/04/2013  FL2 transmitted to all facilities in geographic area requested by pt/family on  11/04/2013 FL2 transmitted to all facilities within larger geographic area on   Patient informed that his/her managed care company has contracts with or will negotiate with  certain facilities, including the following:     Patient/family informed of bed offers received:  11/04/2013 Patient chooses bed at  Physician recommends and patient chooses bed at    Patient to be transferred to  on   Patient to be transferred to facility by  Patient and family notified of transfer on  Name of family member notified:    The following physician request were entered in Epic:   Additional Comments:    Liz Beach MSW, [place], [place],  [phone]

## 2013-11-04 NOTE — Care Management Note (Addendum)
    Page 1 of 1   11/05/2013     11:09:52 AM CARE MANAGEMENT NOTE 11/05/2013  Patient:  Tony Boyd, Tony Boyd   Account Number:  [id number]  Date Initiated:  11/04/2013  Documentation initiated by:  Tomi Bamberger  Subjective/Objective Assessment:   dx fall  admit- from home.     Action/Plan:   pt eval- rec snf   Anticipated DC Date:  11/05/2013   Anticipated DC Plan:  SKILLED NURSING FACILITY  In-house referral  Clinical Social Worker      DC Planning Services  CM consult      Choice offered to / List presented to:             Status of service:  Completed, signed off Medicare Important Message given?  YES (If response is "NO", the following Medicare IM given date fields will be blank) Date Medicare IM given:  11/04/2013 Medicare IM given by:  Tomi Bamberger Date Additional Medicare IM given:   Additional Medicare IM given by:    Discharge Disposition:  [place]  Per UR Regulation:  Reviewed for med. necessity/level of care/duration of stay  If discussed at [place] of Stay Meetings, dates discussed:    Comments:  11/04/13 [place], BSN [phone] patient is for snf, per CSW patient has no more medicare days, so we will do LOG for patient for 2 weeks.  Patient for dc tomorrow.

## 2013-11-04 NOTE — Discharge Summary (Addendum)
Physician Discharge Summary  Tony Boyd FBP:[id number] DOB: Sep 01, 1955 DOA: 11/01/2013  PCP: Hollace Kinnier, DO  Admit date: 11/01/2013 Discharge date: 11/04/2013  Time spent: greater than 30 minutes  Recommendations for Outpatient Follow-up:  1. Monitor blood glucose 2. Continue PT, OT at SNF 3. Monitor weights  Discharge Diagnoses:  Principal Problem:   Encephalopathy acute Active Problems:   Hepatic encephalopathy   ETOH abuse   Cirrhosis   Seizure disorder   Diabetes mellitus type 2, uncontrolled, with complications   Fall   UTI (urinary tract infection)  hypokalemia  Discharge Condition: stable  Filed Weights   11/01/13 2153  Weight: 101.3 kg (223 lb 5.2 oz)    History of present illness:  58 y.o. male brought in from home today after a fall at home. Circumstances surrounding fall are not clear. He was found on his porch, but unlikely that he fell from his roof as was initially reported.  uninjured other than a hematoma at this time.  He was also noted to be confused while in the ED. urine does have nitrites small leukocyte esterase. History of hepatic encephalopathy, but ammonia level 10.  Hospital Course:  Admitted to hospitalists. Placed on antibiotics and alcohol withdrawal protocol  Principal Problem:  Encephalopathy acute: improved rapidly, now oriented to place, year, situation. ammonia level normal. Suspect alcohol withdrawal, but could be unwitnessed seizure/postictal or toxic encephalopathy from UTI. Patient admits he is still drinking. EEG shows generalized slowing without epileptiform activity. Patient is currently cooperative with CIWA scores of 1. He has worked with physical therapy who recommended short-term skilled nursing facility placement. Patient is agreeable. Recommend 3 more days of Ceftin 500 mg by mouth twice a day. Patient does have capacity for informed consent Active Problems:  Hepatic encephalopathy: xifaxan and lactulose should be  continued. ETOH abuse: still drinking.  Counseled to quit. No evidence of withdrawal currently. Cirrhosis. H/o anasarca. Weight down from previous admissions. Diuretics are maintained. Seizure disorder:  Keppra continued. Diabetes mellitus type 2, uncontrolled, with complications: Stable, continue outpatient regimen. Fall  UTI (urinary tract infection): Has received 3 days of IV antibiotics. We'll transition to Ceftin for 4 more days. 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  confabulating. Cooperative and pleasant. Cardiovascular: Regular rate rhythm without murmurs gallops rubs Respiratory: Clear to auscultation bilaterally without wheezes rhonchi or rales Abdomen: Obese. Soft nontender. Extremities: Brawny edema. Neurologic: Nonfocal.   Discharge Instructions   Diet - low sodium heart healthy    Complete by:  As directed      Diet Carb Modified    Complete by:  As directed      Walk with assistance    Complete by:  As directed           Current Discharge Medication List    START taking these medications   Details  cefUROXime (CEFTIN) 500 MG tablet Take 1 tablet (500 mg total) by mouth 2 (two) times daily with a meal. Through 9/13. Then stop    thiamine 100 MG tablet Take 1 tablet (100 mg total) by mouth daily.      CONTINUE these medications which have CHANGED   Details  ALPRAZolam (XANAX) 1 MG tablet Take 1 tablet (1 mg total) by mouth  3 (three) times daily as needed for anxiety or sleep (OR AGITATION). Qty: 20 tablet, Refills: 0    oxyCODONE (OXY IR/ROXICODONE) 5 MG immediate release tablet Take one tablet by mouth every 6 hours as needed for pain Qty: 30 tablet, Refills: 0       CONTINUE these medications which have NOT CHANGED   Details  ENSURE (ENSURE) Take 237 mLs by mouth daily.    ferrous sulfate 325 (65 FE) MG tablet Take 1 tablet (325 mg total) by mouth at bedtime. Qty: 30 tablet, Refills: 0    folic acid (FOLVITE) 1 MG tablet Take 1 tablet (1 mg total) by mouth every morning. Qty: 30 tablet, Refills: 1    furosemide (LASIX) 20 MG tablet Take 3 tablets (60 mg total) by mouth 2 (two) times daily. Qty: 1 tablet, Refills: 0    Insulin Glargine (LANTUS SOLOSTAR) 100 UNIT/ML Solostar Pen Inject 5 Units into the skin daily at 10 pm. Qty: 15 mL, Refills: 11    lactulose (CHRONULAC) 10 GM/15ML solution Take 15 mLs (10 g total) by mouth 2 (two) times daily as needed for mild constipation. Qty: 240 mL, Refills: 1    levETIRAcetam (KEPPRA) 500 MG tablet Take 2 tablets (1,000 mg total) by mouth 2 (two) times daily. Qty: 180 tablet, Refills: 1   Associated Diagnoses: Seizure disorder    levothyroxine (SYNTHROID, LEVOTHROID) 75 MCG tablet Take 75 mcg by mouth daily before breakfast.    Multiple Vitamin (MULTIVITAMIN WITH MINERALS) TABS tablet Take 1 tablet by mouth every morning. Qty: 30 tablet, Refills: 1    nadolol (CORGARD) 40 MG tablet Take 1 tablet (40 mg total) by mouth every morning. Qty: 90 tablet, Refills: 1    omeprazole (PRILOSEC) 40 MG capsule Take 1 capsule (40 mg total) by mouth daily. Qty: 30 capsule, Refills: 1    PARoxetine (PAXIL) 20 MG tablet Take 1 tablet (20 mg total) by mouth daily. Qty: 30 tablet, Refills: 1    rifaximin (XIFAXAN) 550 MG TABS tablet Take 550 mg by mouth 2 (two) times daily.    risperiDONE (RISPERDAL) 0.25 MG tablet Take 1 tablet (0.25 mg total) by mouth 2 (two) times daily. Qty: 180 tablet, Refills: 3    spironolactone (ALDACTONE) 100 MG tablet Take 2 tablets (200 mg total) by mouth every morning. Qty: 60 tablet, Refills: 1       Allergies  Allergen Reactions  . Ativan [Lorazepam] Other (See Comments)     Makes patient very weak and cant mentate appropriately  . Tylenol [Acetaminophen] Other (See Comments)    Aggravates liver problems  . Droperidol Hives  . Penicillins Swelling  . Toradol [Ketorolac Tromethamine] Hives      The results of significant diagnostics from this hospitalization (including imaging, microbiology, ancillary and laboratory) are listed below for reference.    Significant Diagnostic Studies: Dg Chest 1 View  11/01/2013   CLINICAL DATA:  Fall  EXAM: CHEST - 1 VIEW  COMPARISON:  09/11/2013  FINDINGS: Lungs are essentially clear. No focal consolidation. No pleural effusion or pneumothorax.  The heart is normal in size.  IMPRESSION: No evidence of acute cardiopulmonary disease.   Electronically Signed   By: Julian Hy M.D.   On: 11/01/2013 19:14   Dg Hip Bilateral W/pelvis  11/01/2013   CLINICAL DATA:  Bilateral hip pain, fall  EXAM: BILATERAL HIP WITH PELVIS - 4+ VIEW  COMPARISON:  None.  FINDINGS: Status post ORIF of the left hip. No evidence of complication. Associated  callus formation without superimposed acute fracture.  Right hip appears intact. Mild degenerative changes of the bilateral hips.  Visualized bony pelvis appears intact.  Degenerative changes of the lower lumbar spine.  IMPRESSION: Status post ORIF of the left hip.  No evidence of acute fracture or dislocation.   Electronically Signed   By: Julian Hy M.D.   On: 11/01/2013 19:16   Ct Head Wo Contrast  11/01/2013   CLINICAL DATA:  Fall from roof, slurred speech  EXAM: CT HEAD WITHOUT CONTRAST  CT CERVICAL SPINE WITHOUT CONTRAST  TECHNIQUE: Multidetector CT imaging of the head and cervical spine was performed following the standard protocol without intravenous contrast. Multiplanar CT image reconstructions of the cervical spine were also generated.  COMPARISON:  09/11/2013  FINDINGS: CT HEAD FINDINGS  No evidence of parenchymal hemorrhage or extra-axial fluid collection. No mass lesion, mass effect, or  midline shift.  No CT evidence of acute infarction.  Subcortical white matter and periventricular small vessel ischemic changes.  Cerebral volume is within normal limits.  No ventriculomegaly.  Complete opacification of the left sphenoid sinus. Partial opacification of the left mastoid air cells.  No evidence of calvarial fracture.  CT CERVICAL SPINE FINDINGS  Normal cervical lordosis.  No evidence of fracture or dislocation. Vertebral body heights are maintained. Dens appears intact.  Mild multilevel degenerative changes.  Visualized thyroid is grossly unremarkable.  Visualized lung apices are essentially clear.  IMPRESSION: No evidence of acute intracranial abnormality. Mild small vessel ischemic changes.  No evidence of traumatic injury to the cervical spine. Mild multilevel degenerative changes.   Electronically Signed   By: Julian Hy M.D.   On: 11/01/2013 17:55   Ct Cervical Spine Wo Contrast  11/01/2013   CLINICAL DATA:  Fall from roof, slurred speech  EXAM: CT HEAD WITHOUT CONTRAST  CT CERVICAL SPINE WITHOUT CONTRAST  TECHNIQUE: Multidetector CT imaging of the head and cervical spine was performed following the standard protocol without intravenous contrast. Multiplanar CT image reconstructions of the cervical spine were also generated.  COMPARISON:  09/11/2013  FINDINGS: CT HEAD FINDINGS  No evidence of parenchymal hemorrhage or extra-axial fluid collection. No mass lesion, mass effect, or midline shift.  No CT evidence of acute infarction.  Subcortical white matter and periventricular small vessel ischemic changes.  Cerebral volume is within normal limits.  No ventriculomegaly.  Complete opacification of the left sphenoid sinus. Partial opacification of the left mastoid air cells.  No evidence of calvarial fracture.  CT CERVICAL SPINE FINDINGS  Normal cervical lordosis.  No evidence of fracture or dislocation. Vertebral body heights are maintained. Dens appears intact.  Mild multilevel  degenerative changes.  Visualized thyroid is grossly unremarkable.  Visualized lung apices are essentially clear.  IMPRESSION: No evidence of acute intracranial abnormality. Mild small vessel ischemic changes.  No evidence of traumatic injury to the cervical spine. Mild multilevel degenerative changes.   Electronically Signed   By: Julian Hy M.D.   On: 11/01/2013 17:55    Microbiology: Recent Results (from the past 240 hour(s))  CULTURE, BLOOD (ROUTINE X 2)     Status: None   Collection Time    11/01/13 10:00 PM      Result Value Ref Range Status   Specimen Description BLOOD LEFT ANTECUBITAL   Final   Special Requests BOTTLES DRAWN AEROBIC AND ANAEROBIC 5CC EA   Final   Culture  Setup Time     Final   Value: 11/02/2013 00:26     Performed  at [facility]   Culture     Final   Value:        BLOOD CULTURE RECEIVED NO GROWTH TO DATE CULTURE WILL BE HELD FOR 5 DAYS BEFORE ISSUING A FINAL NEGATIVE REPORT     Performed at [facility]   Report Status PENDING   Incomplete  CULTURE, BLOOD (ROUTINE X 2)     Status: None   Collection Time    11/01/13 10:10 PM      Result Value Ref Range Status   Specimen Description BLOOD LEFT FOREARM   Final   Special Requests BOTTLES DRAWN AEROBIC AND ANAEROBIC 5CC EA   Final   Culture  Setup Time     Final   Value: 11/02/2013 00:26     Performed at [facility]   Culture     Final   Value:        BLOOD CULTURE RECEIVED NO GROWTH TO DATE CULTURE WILL BE HELD FOR 5 DAYS BEFORE ISSUING A FINAL NEGATIVE REPORT     Performed at [facility]   Report Status PENDING   Incomplete     Labs: Basic Metabolic Panel:  Recent Labs Lab 11/01/13 1805 11/03/13 0820 11/04/13 0623  NA 138 141 138  K 3.2* 3.6* 3.4*  CL 100 104 101  CO2 27 27 30   GLUCOSE 100* 123* 160*  BUN 11 9 7   CREATININE 0.58 0.63 0.67  CALCIUM 8.0* 7.6* 7.4*   Liver Function Tests:  Recent Labs Lab 11/01/13 1805 11/04/13 0623  AST 99* 73*   ALT 41 35  ALKPHOS 358* 297*  BILITOT 10.0* 4.6*  PROT 7.0 6.0  ALBUMIN 2.0* 1.6*    Recent Labs Lab 11/01/13 1805  LIPASE 22    Recent Labs Lab 11/01/13 2200  AMMONIA 10*   CBC:  Recent Labs Lab 11/01/13 1805 11/04/13 0623  WBC 5.5 2.4*  HGB 11.6* 9.8*  HCT 34.6* 30.0*  MCV 111.3* 113.6*  PLT 48* 44*   Cardiac Enzymes: No results found for this basename: CKTOTAL, CKMB, CKMBINDEX, TROPONINI,  in the last 168 hours BNP: BNP (last 3 results)  Recent Labs  05/31/13 2330 06/22/13 1738 06/30/13 2225  PROBNP 304.0* 296.3* 669.2*   CBG:  Recent Labs Lab 11/03/13 0809 11/03/13 1202 11/03/13 1704 11/03/13 2211 11/04/13 0758  GLUCAP 111* 233* 235* 167* 171*       Signed:  Hamdi Vari L  [facility] 11/04/2013, 10:01 AM

## 2013-11-04 NOTE — Clinical Social Work Note (Signed)
CSW has attempted to reach patient's daughters to notify them of discharge and to set up SNF placement. Unable to leave message with [name]. CSW was able to leave message with [name].   Liz Beach MSW, [place], [place], [phone]

## 2013-11-05 LAB — GLUCOSE, CAPILLARY
Glucose-Capillary: 184 mg/dL — ABNORMAL HIGH (ref 70–99)
Glucose-Capillary: 220 mg/dL — ABNORMAL HIGH (ref 70–99)

## 2013-11-05 NOTE — Clinical Social Work Note (Signed)
CSW has left messages with both daughters informing them that the patient is discharging to [facility] today. CSW provided daughters with contact information for receiving facility. CSW will assist with discharge.  Liz Beach MSW, [place], [place], [phone]

## 2013-11-05 NOTE — Plan of Care (Signed)
Problem: Phase I Progression Outcomes Goal: Initial discharge plan identified Outcome: Completed/Met Date Met:  11/05/13 Need SNF placement  Problem: Phase II Progression Outcomes Goal: Progress activity as tolerated unless otherwise ordered Outcome: Completed/Met Date Met:  11/05/13 Up to [facility], working with PT

## 2013-11-05 NOTE — Progress Notes (Signed)
Physical Therapy Treatment Patient Details Name: Tony Boyd MRN: [id number] DOB: 1955-11-22 Today's Date: 11/05/2013    History of Present Illness Tony Boyd is a 58 y.o. male brought in from home today after a fall at home. Circumstances surrounding fall are not clear. He was found on his porch he is uninjured other than a hematoma at this time. He was also noted to be confused while in the ED.  His confusion is suspected to be related to hepatic encephalopathy as the patient has known ESLD, poor compliance with home hepatic encephalopathy meds, and multiple admits in the past for this    PT Comments    Patient progressing well with mobility. Improved ambulation distance with multiple short rest breaks due to fatigue. Tolerated there ex in chair. Pt very cooperative. Requires cues for safety due to cognitive deficits and poor safety awareness. Will continue to follow and progress as tolerated.   Follow Up Recommendations  SNF;Supervision/Assistance - 24 hour     Equipment Recommendations  None recommended by PT    Recommendations for Other Services       Precautions / Restrictions Precautions Precautions: Fall Restrictions Weight Bearing Restrictions: No    Mobility  Bed Mobility Overal bed mobility: Needs Assistance Bed Mobility: Supine to Sit     Supine to sit: Min guard     General bed mobility comments: cues for hand placement on rail with guarding and cues to fully elevate trunk  Transfers Overall transfer level: Needs assistance Equipment used: Rolling walker (2 wheeled) Transfers: Sit to/from Stand Sit to Stand: Min guard         General transfer comment: VC for hand placement. Min guard for safety.  Ambulation/Gait Ambulation/Gait assistance: Min guard Ambulation Distance (Feet): 140 Feet Assistive device: Rolling walker (2 wheeled) Gait Pattern/deviations: Step-to pattern;Trunk flexed;Decreased stance time - left;Decreased step length - right    Gait velocity interpretation: Below normal speed for age/gender General Gait Details: VC for step through gait pattern, posture and position within RW. Multiple short standing rest breaks.    Stairs            Wheelchair Mobility    Modified Rankin (Stroke Patients Only)       Balance Overall balance assessment: Needs assistance   Sitting balance-Leahy Scale: Fair       Standing balance-Leahy Scale: Poor Standing balance comment: Requires BUE support on RW for stability/safety.                    Cognition Arousal/Alertness: Awake/alert Behavior During Therapy: WFL for tasks assessed/performed Overall Cognitive Status: Impaired/Different from baseline Area of Impairment: Safety/judgement Orientation Level: Disoriented to;Time   Memory: Decreased short-term memory   Safety/Judgement: Decreased awareness of deficits;Decreased awareness of safety   Problem Solving: Requires verbal cues;Slow processing      Exercises General Exercises - Lower Extremity Ankle Circles/Pumps: Both;15 reps;Seated Long Arc Quad: Both;15 reps;Seated Hip ABduction/ADduction: Both;15 reps;Seated Hip Flexion/Marching: Both;10 reps;Seated    General Comments        Pertinent Vitals/Pain Pain Assessment: No/denies pain    Home Living                      Prior Function            PT Goals (current goals can now be found in the care plan section) Progress towards PT goals: Progressing toward goals    Frequency       PT Plan Current plan remains  appropriate    Co-evaluation             End of Session Equipment Utilized During Treatment: Gait belt Activity Tolerance: Patient tolerated treatment well Patient left: in chair;with call bell/phone within reach;with chair alarm set     Time: 2706-2376 PT Time Calculation (min): 25 min  Charges:  $Gait Training: 8-22 mins $Therapeutic Exercise: 8-22 mins                    G CodesCandy Sledge A November 08, 2013, 1:15 PM Candy Sledge, [place], DPT [phone]

## 2013-11-05 NOTE — Clinical Social Work Note (Signed)
CSW made several attempts yesterday to reach patient's daughters to notify of discharge to [facility]. CSW made followup calls this morning and had to leave additional messages notifying the daughters that the patient is discharging to SNF today. CSW will update MD.  Liz Beach MSW, [place], [place], [phone]

## 2013-11-05 NOTE — Progress Notes (Signed)
Tony Boyd discharged Skilled nursing facility per MD order.  Report called to receiving LPN, Colletta Maryland at [facility].     Medication List         ALPRAZolam 1 MG tablet  Commonly known as:  XANAX  Take 1 tablet (1 mg total) by mouth 3 (three) times daily as needed for anxiety or sleep (OR AGITATION).     cefUROXime 500 MG tablet  Commonly known as:  CEFTIN  Take 1 tablet (500 mg total) by mouth 2 (two) times daily with a meal.     ENSURE  Take 237 mLs by mouth daily.     ferrous sulfate 325 (65 FE) MG tablet  Take 1 tablet (325 mg total) by mouth at bedtime.     folic acid 1 MG tablet  Commonly known as:  FOLVITE  Take 1 tablet (1 mg total) by mouth every morning.     furosemide 20 MG tablet  Commonly known as:  LASIX  Take 3 tablets (60 mg total) by mouth 2 (two) times daily.     Insulin Glargine 100 UNIT/ML Solostar Pen  Commonly known as:  LANTUS SOLOSTAR  Inject 5 Units into the skin daily at 10 pm.     lactulose 10 GM/15ML solution  Commonly known as:  CHRONULAC  Take 15 mLs (10 g total) by mouth 2 (two) times daily as needed for mild constipation.     levETIRAcetam 500 MG tablet  Commonly known as:  KEPPRA  Take 2 tablets (1,000 mg total) by mouth 2 (two) times daily.     levothyroxine 75 MCG tablet  Commonly known as:  SYNTHROID, LEVOTHROID  Take 75 mcg by mouth daily before breakfast.     multivitamin with minerals Tabs tablet  Take 1 tablet by mouth every morning.     nadolol 40 MG tablet  Commonly known as:  CORGARD  Take 1 tablet (40 mg total) by mouth every morning.     omeprazole 40 MG capsule  Commonly known as:  PRILOSEC  Take 1 capsule (40 mg total) by mouth daily.     oxyCODONE 5 MG immediate release tablet  Commonly known as:  Oxy IR/ROXICODONE  Take one tablet by mouth every 6 hours as needed for pain     PARoxetine 20 MG tablet  Commonly known as:  PAXIL  Take 1 tablet (20 mg total) by mouth daily.     rifaximin 550  MG Tabs tablet  Commonly known as:  XIFAXAN  Take 550 mg by mouth 2 (two) times daily.     risperiDONE 0.25 MG tablet  Commonly known as:  RISPERDAL  Take 1 tablet (0.25 mg total) by mouth 2 (two) times daily.     spironolactone 100 MG tablet  Commonly known as:  ALDACTONE  Take 2 tablets (200 mg total) by mouth every morning.     thiamine 100 MG tablet  Take 1 tablet (100 mg total) by mouth daily.        Patients skin is clean, dry and intact, sacrum very excoriated at discharge. IV site discontinued and catheter remains intact. Site without signs and symptoms of complications. Dressing and pressure applied.  Patient transported on a stretcher by non emergent EMS,  no distress noted upon discharge, belongings & d/c packet sent with pt.  [name] C 11/05/2013 1:56 PM

## 2013-11-05 NOTE — Progress Notes (Signed)
Patient examined. Mental status continues to improve. See amended discharge summary from 11/04/13  Doree Barthel, MD

## 2013-11-05 NOTE — Clinical Social Work Placement (Signed)
Clinical Social Work Department CLINICAL SOCIAL WORK PLACEMENT NOTE 11/05/2013  Patient:  Tony Boyd, Tony Boyd  Account Number:  [id number] [place] date:  11/01/2013  Clinical Social Worker:  Kemper Durie, [place]  Date/time:  11/04/2013 12:14 PM  Clinical Social Work is seeking post-discharge placement for this patient at the following level of care:   SKILLED NURSING   (*CSW will update this form in Epic as items are completed)   11/04/2013  Patient/family provided with [place] Department of Clinical Social Work's list of facilities offering this level of care within the geographic area requested by the patient (or if unable, by the patient's family).  11/04/2013  Patient/family informed of their freedom to choose among providers that offer the needed level of care, that participate in Medicare, Medicaid or managed care program needed by the patient, have an available bed and are willing to accept the patient.  11/04/2013  Patient/family informed of [facility]' ownership interest in [facility], as well as of the fact that they are under no obligation to receive care at this facility.  PASARR submitted to EDS on 11/04/2013 PASARR number received on 11/04/2013  FL2 transmitted to all facilities in geographic area requested by pt/family on  11/04/2013 FL2 transmitted to all facilities within larger geographic area on   Patient informed that his/her managed care company has contracts with or will negotiate with  certain facilities, including the following:     Patient/family informed of bed offers received:  11/04/2013 Patient chooses bed at [facility]ician recommends and patient chooses bed at    Patient to be transferred to [facility] on  11/05/2013 Patient to be transferred to facility by Ambulance Patient and family notified of transfer on 11/05/2013 Name of family member notified:  CSW has made several attempts to reach patient's  daughter  The following physician request were entered in Epic:   Additional Comments: Per MD patient ready for DC to [facility] [place], patient, patient's family (several messages left with daughters), and facility notified of DC. RN given number for report. DC packet on chart. [facility] transport requested for patient. CSW signing off.   Liz Beach MSW, [place], [place], [phone]

## 2013-11-08 LAB — CULTURE, BLOOD (ROUTINE X 2)
CULTURE: NO GROWTH
Culture: NO GROWTH

## 2014-08-09 IMAGING — CR DG CLAVICLE*L*
2 series · 2 of 2 positions shown · non-contrast
Comparison: No prior clavicle imaging.  Visualized left clavicle
and chest x-ray 04/16/2012.

CLINICAL DATA: Fell from a motorized chair, landing on the left
shoulder.

LEFT CLAVICLE - 2+ VIEWS

[w clavicle ap left]
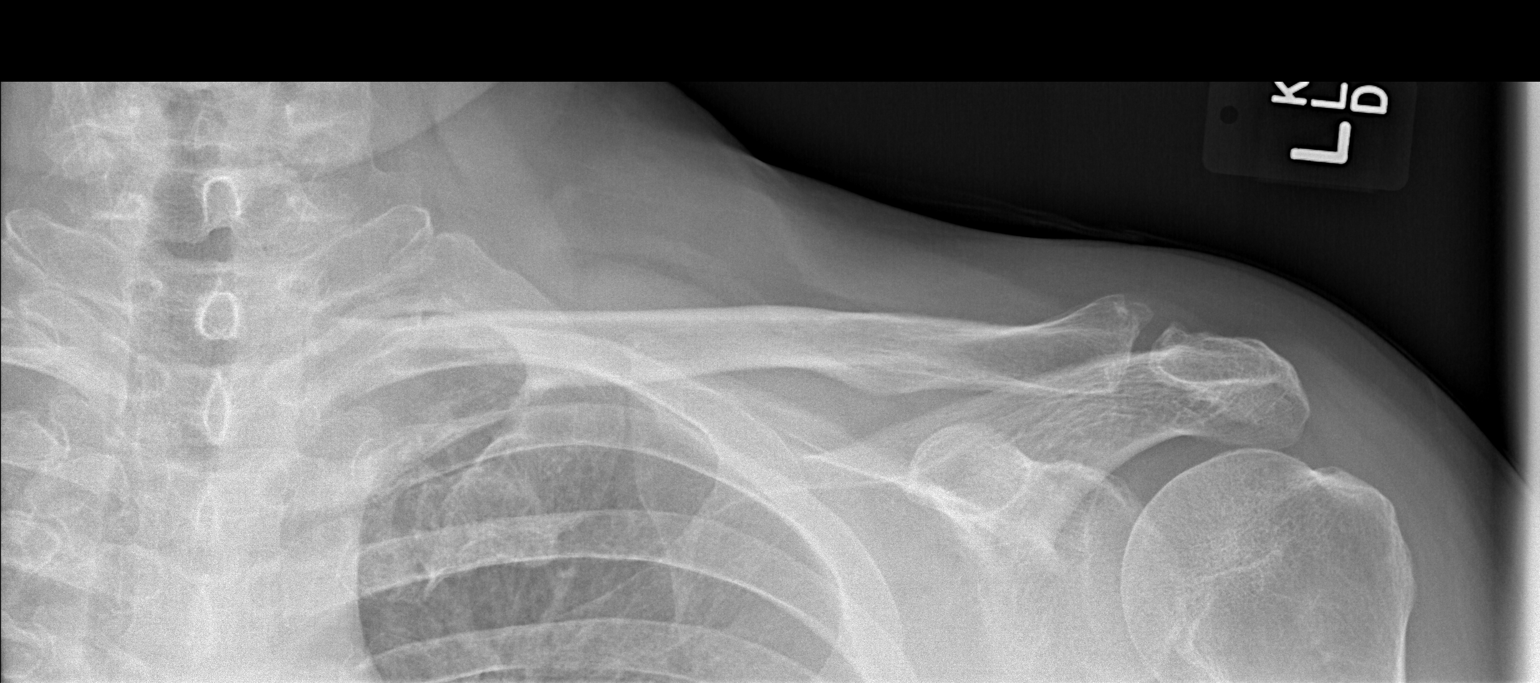

[w clavicle tangential left]
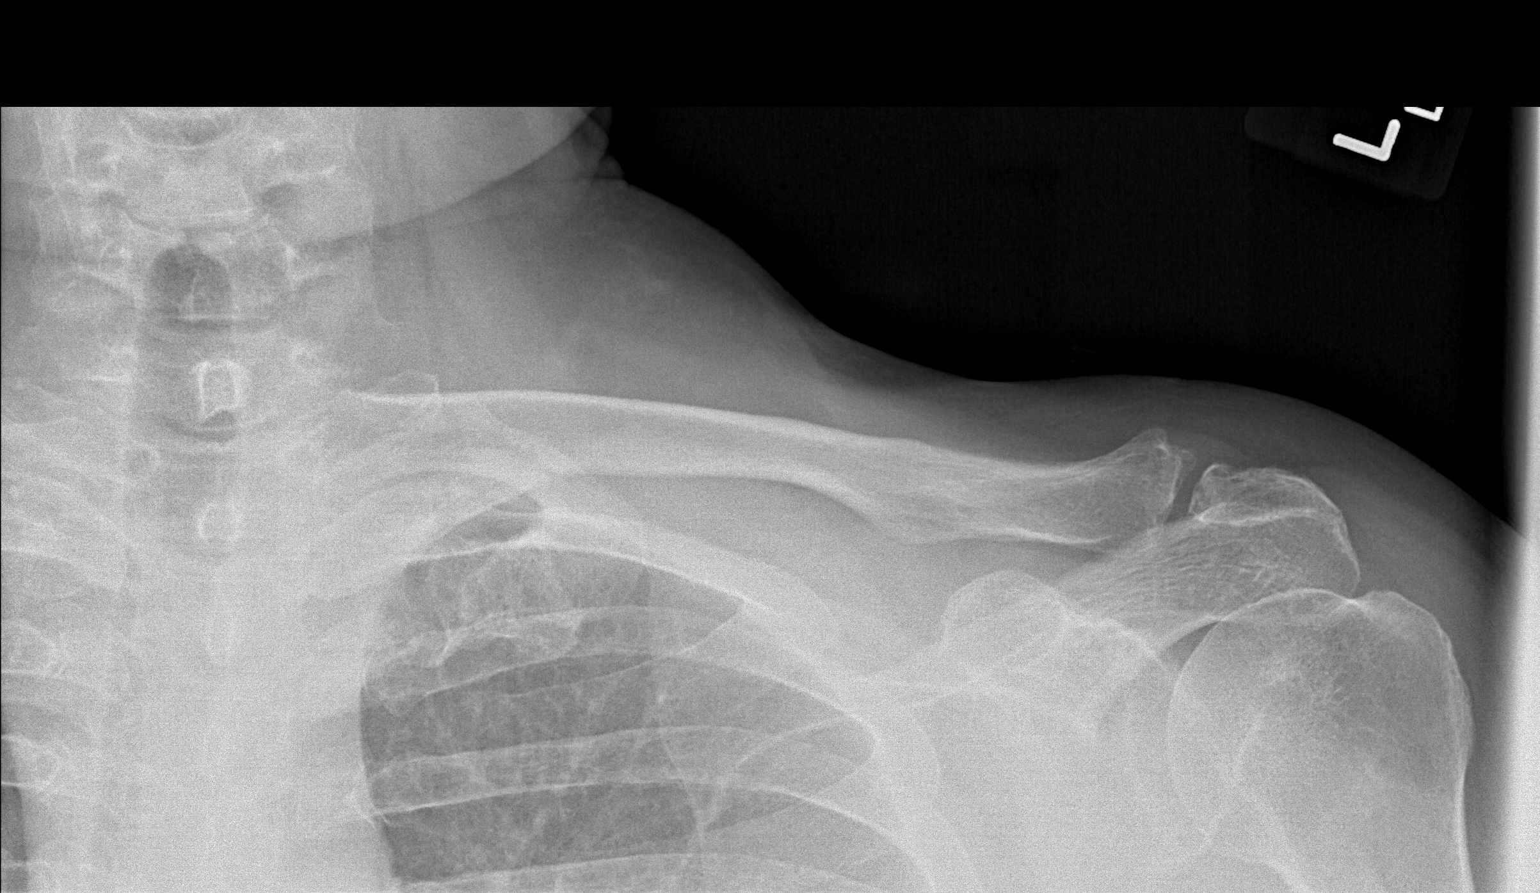

[2 of 2 positions shown; findings below may reference images not displayed]

FINDINGS: No fractures identified involving the clavicle.
Acromioclavicular joint intact with mild to moderate degenerative
changes.
IMPRESSION: No acute osseous abnormality.  Degenerative changes in the AC
joint.

## 2014-08-09 IMAGING — CT CT ANGIO NECK
2 of 3 series · 8 of 14 positions shown · IV contrast (OMNIPAQUE 300)
Comparison: CT head without contrast 06/01/2012

CLINICAL DATA: Fall.  Hematoma in the left neck.

CT NECK WITHOUT CONTRAST
TECHNIQUE: Multidetector CT imaging of the neck was performed
following the standard protocol without intravenous contrast.

[Series 5: cta neck · axial · 0.44mm/px · z∈[-242,-156]mm · 2 of 129 slices shown]
[im 43/129  soft-tissue]
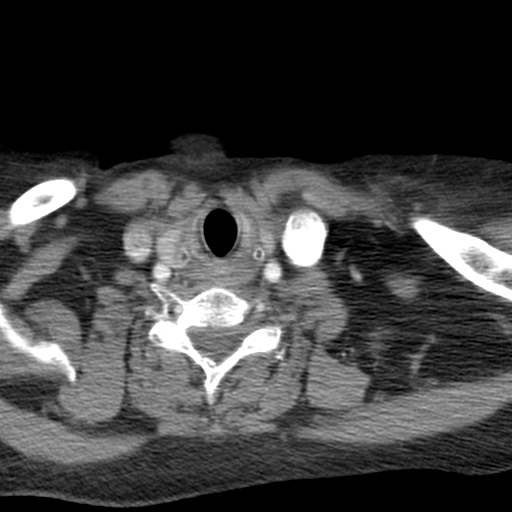
[im 86/129  soft-tissue]
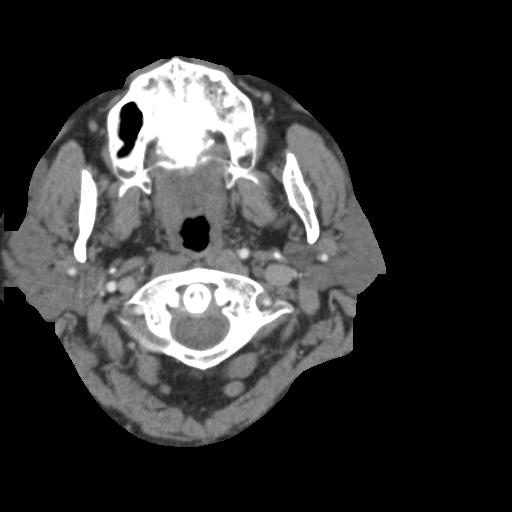

[Series 6: axial · axial · 0.39mm/px · z∈[-298,-115]mm · 6 of 257 slices shown]
[im 37/257  soft-tissue]
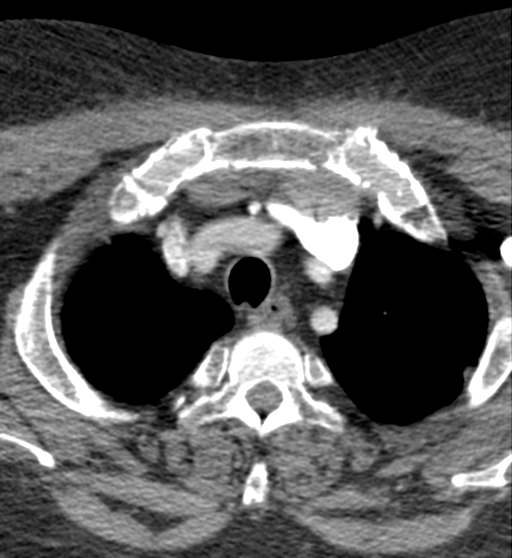
[im 74/257  bone]
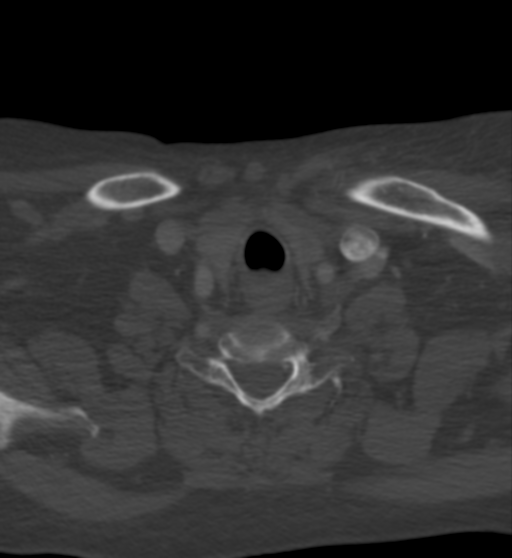
[im 110/257  soft-tissue]
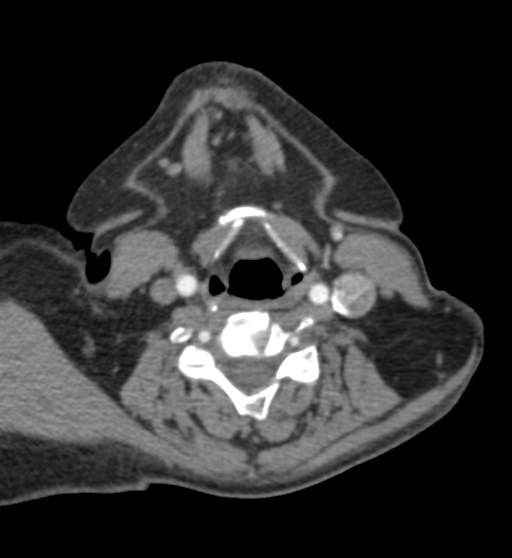
[im 147/257  bone]
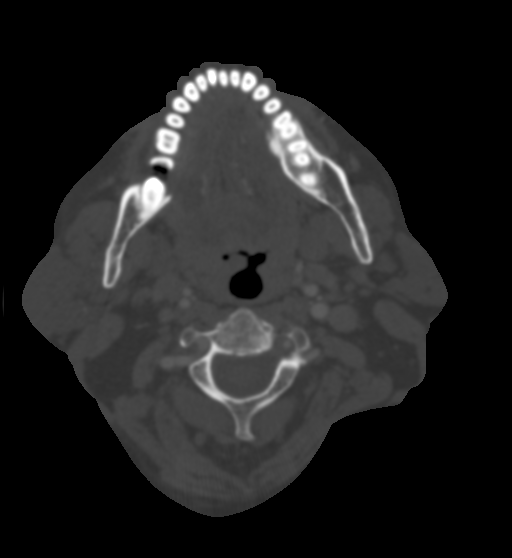
[im 183/257  soft-tissue]
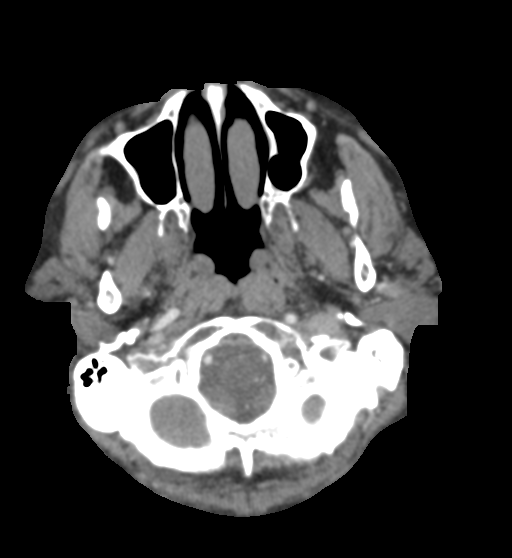
[im 220/257  bone]
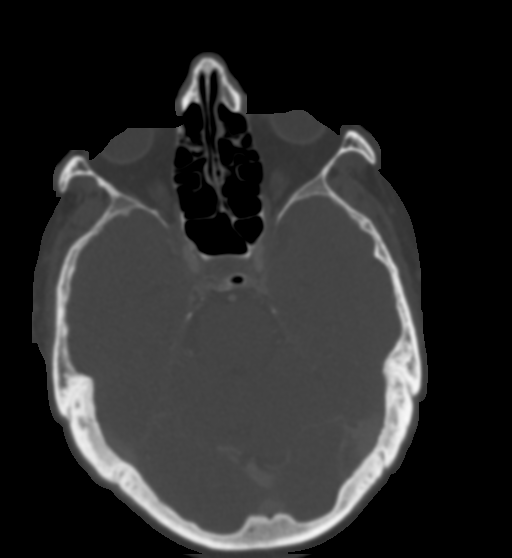

[8 of 14 positions shown; findings below may reference images not displayed]

FINDINGS: There is a common origin of the innominate artery and
the left common carotid artery.  Both vertebral arteries originate
from the subclavian arteries.  The right vertebral artery is the
dominant vessel.  There are no significant stenoses in the neck.
The PICA origins are visualized and normal.  The basilar artery is
within normal limits.  Both posterior cerebral arteries originate
from the basilar tip.

The right common carotid artery is within normal limits.  The right
carotid bifurcation is normal.  There is moderate tortuosity of the
high cervical right ICA without a significant stenosis.  Minimal
atherosclerotic calcifications are present within the cavernous
segment of the right internal carotid artery without a significant
stenosis.

The left common carotid artery is mildly tortuous.  The left
carotid bifurcation is within normal limits.  Moderate tortuosity
is noted in the high cervical left ICA.  The intracranial left ICA
demonstrates mild atherosclerotic calcifications at the cavernous
segment without a significant stenosis.

The A1 and M1 segments are normal.  The anterior communicating
artery is patent.  The carotid bifurcations are unremarkable.  The
visualized branch vessels demonstrate mild segmental irregularity
without a significant proximal stenosis, aneurysm, or occlusion.
IMPRESSION: 1.  Moderate tortuosity of the cervical internal carotid arteries
bilaterally.
2.  No significant carotid artery disease.
3.  Tortuosity of the proximal vertebral arteries bilaterally
without a significant stenosis.  The right vertebral artery is the
dominant vessel.
4.  Mild distal intracranial small vessel disease.

## 2014-08-27 IMAGING — US US ABDOMEN LIMITED
1 series · 6 of 6 positions shown · non-contrast
Comparison: None.

CLINICAL DATA: Assess volume of ascites

LIMITED ABDOMEN ULTRASOUND FOR ASCITES
TECHNIQUE: Limited ultrasound survey for ascites was performed in
all four abdominal quadrants.

[Series 1: us abdomen limited · 0.33mm/px · 6 of 6 slices shown]
[im 1/6]
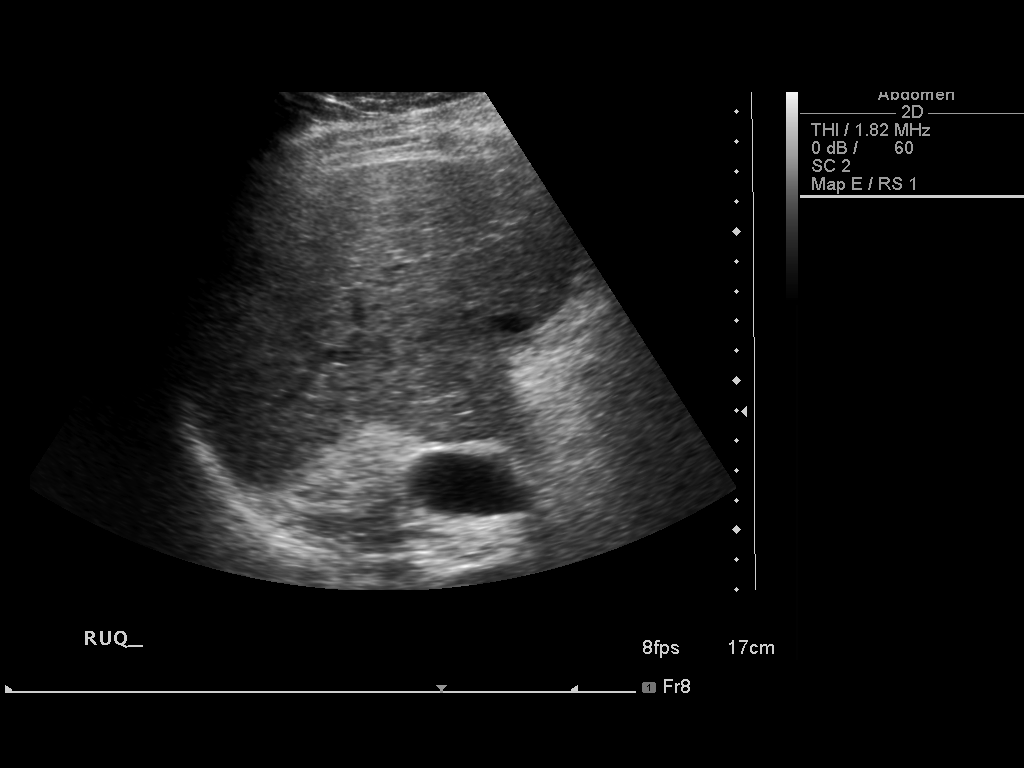
[im 2/6]
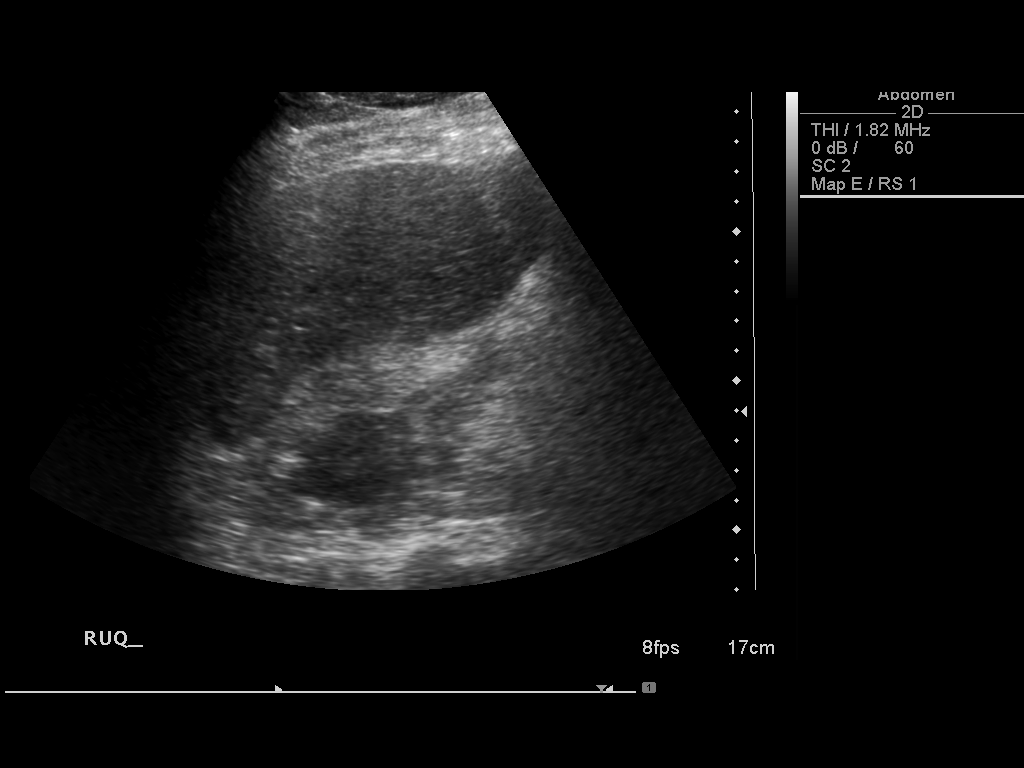
[im 3/6]
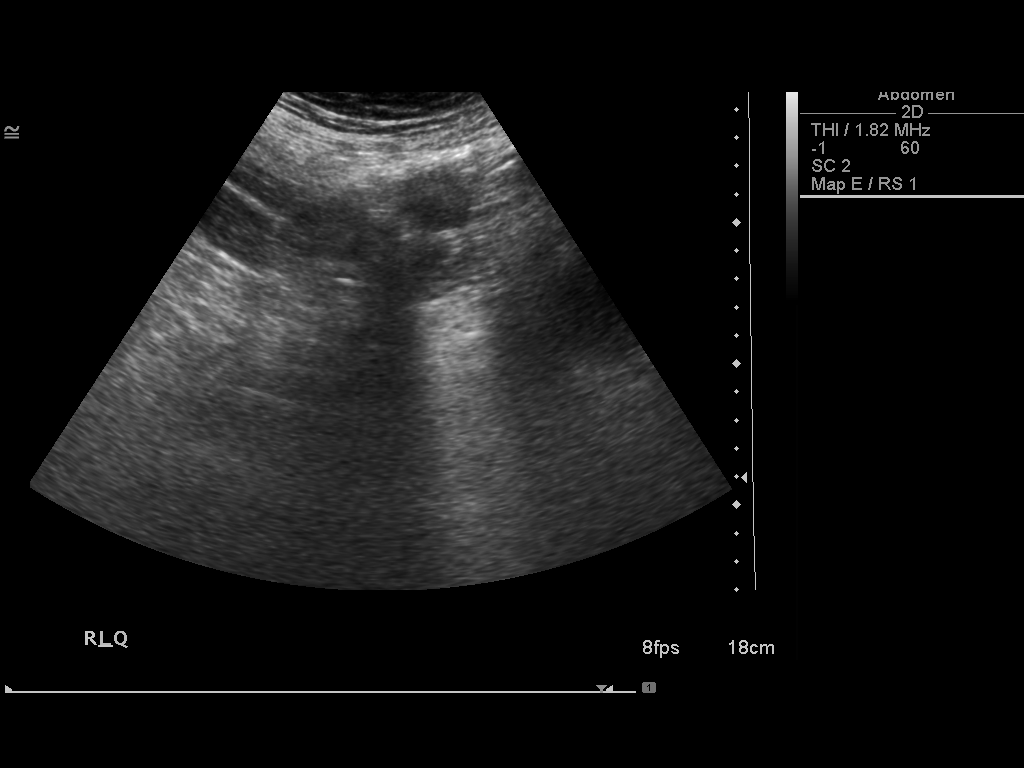
[im 4/6]
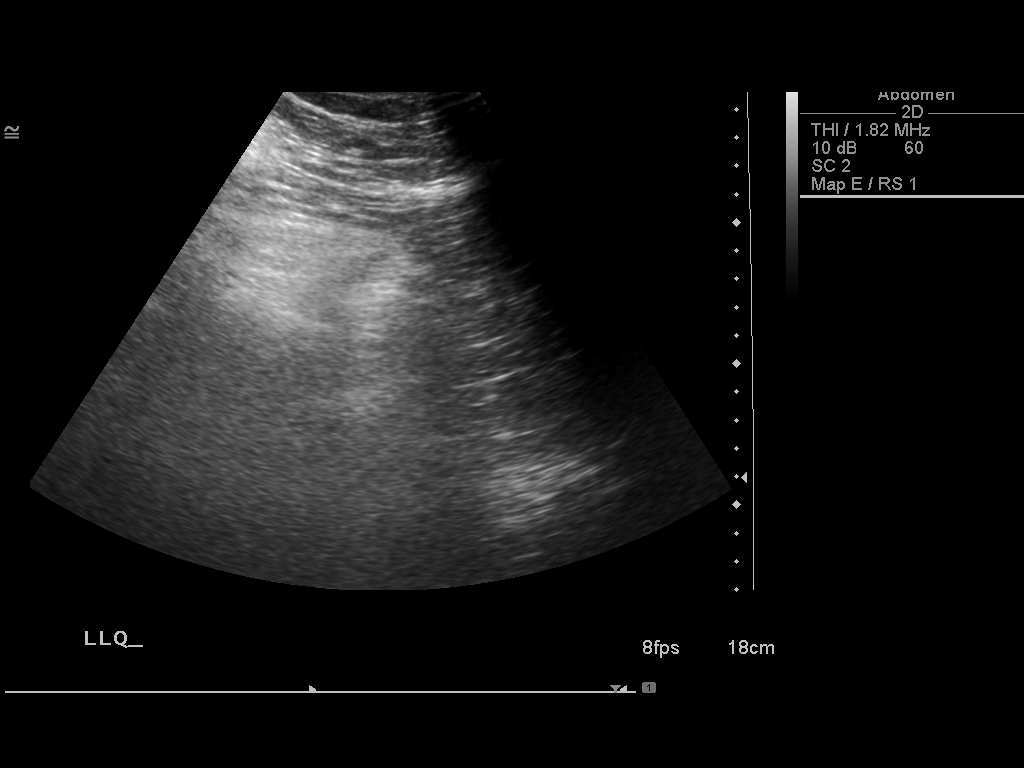
[im 5/6]
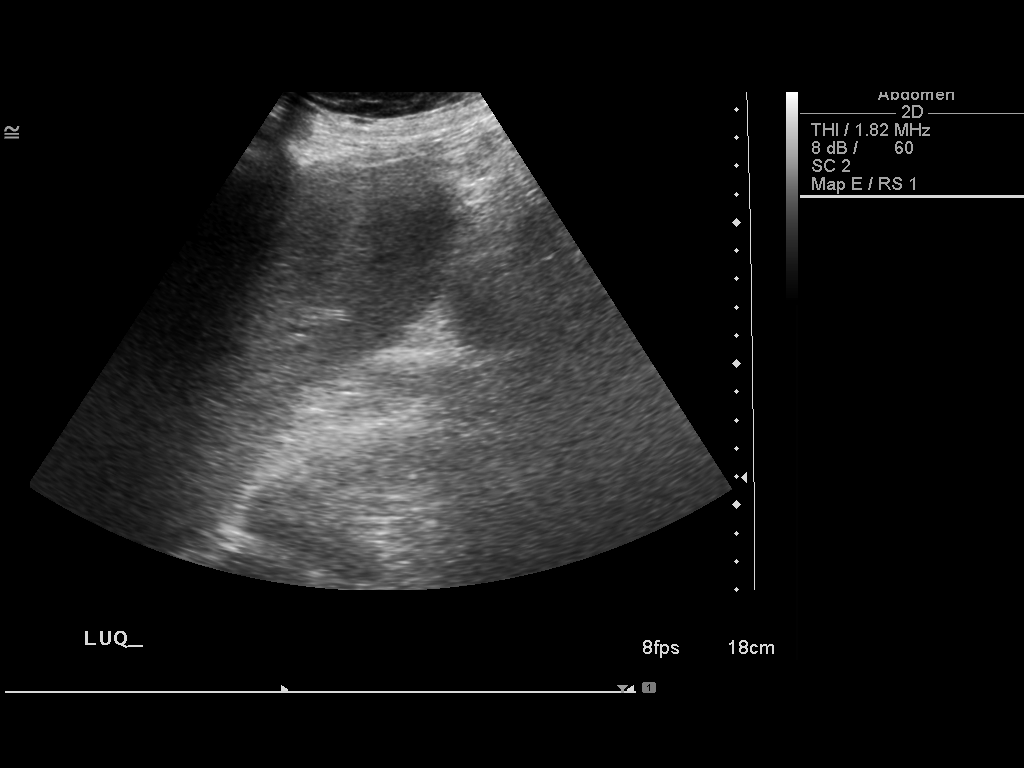
[im 6/6]
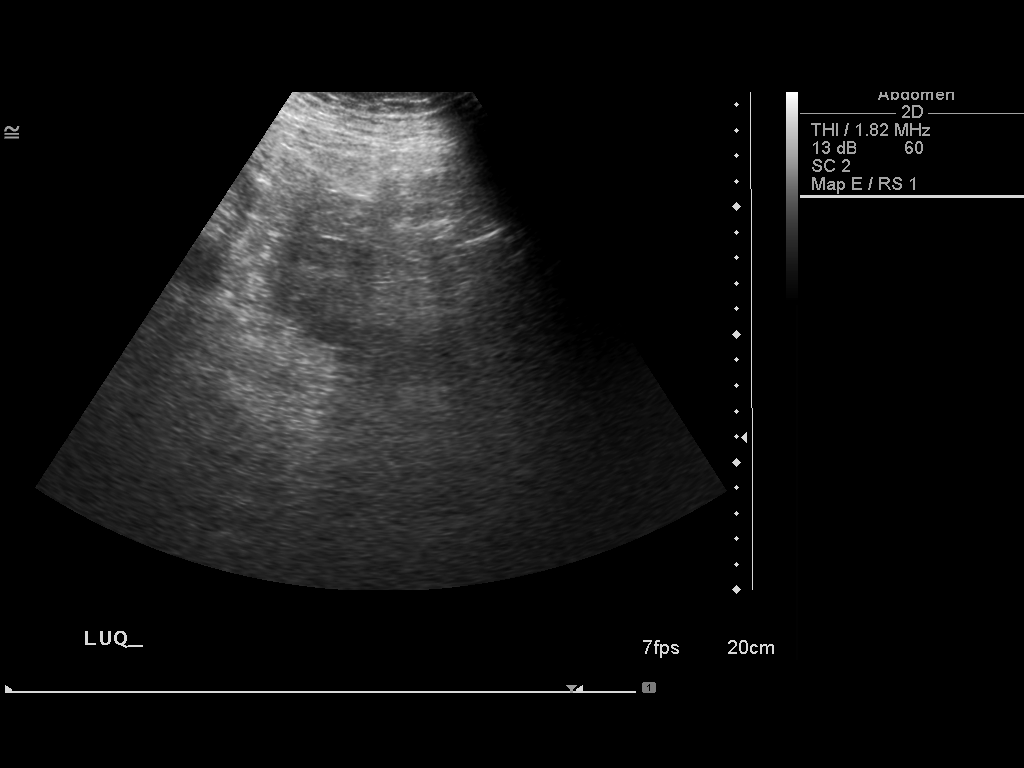

[6 of 6 positions shown; findings below may reference images not displayed]

FINDINGS: Four-quadrant survey views of the abdomen reveal no
ascites.
IMPRESSION: No ascites identified

## 2014-11-27 DEATH — deceased
